# Patient Record
Sex: Male | Born: 1955 | Race: White | Hispanic: No | Marital: Single | Smoking: Never smoker
Health system: Southern US, Community
[De-identification: ages and names within clinical notes are randomized; demographics above are authoritative.]

## PROBLEM LIST (undated history)

## (undated) DIAGNOSIS — E785 Hyperlipidemia, unspecified: Secondary | ICD-10-CM

## (undated) DIAGNOSIS — IMO0001 Reserved for inherently not codable concepts without codable children: Secondary | ICD-10-CM

## (undated) DIAGNOSIS — E119 Type 2 diabetes mellitus without complications: Secondary | ICD-10-CM

## (undated) DIAGNOSIS — K219 Gastro-esophageal reflux disease without esophagitis: Secondary | ICD-10-CM

## (undated) DIAGNOSIS — N200 Calculus of kidney: Secondary | ICD-10-CM

## (undated) DIAGNOSIS — Z87442 Personal history of urinary calculi: Secondary | ICD-10-CM

## (undated) DIAGNOSIS — R51 Headache: Secondary | ICD-10-CM

## (undated) DIAGNOSIS — I10 Essential (primary) hypertension: Secondary | ICD-10-CM

## (undated) DIAGNOSIS — R519 Headache, unspecified: Secondary | ICD-10-CM

## (undated) DIAGNOSIS — M549 Dorsalgia, unspecified: Secondary | ICD-10-CM

## (undated) DIAGNOSIS — M1990 Unspecified osteoarthritis, unspecified site: Secondary | ICD-10-CM

## (undated) HISTORY — PX: APPENDECTOMY: SHX54

## (undated) HISTORY — DX: Calculus of kidney: N20.0

## (undated) HISTORY — DX: Hyperlipidemia, unspecified: E78.5

## (undated) HISTORY — DX: Gastro-esophageal reflux disease without esophagitis: K21.9

## (undated) HISTORY — DX: Unspecified osteoarthritis, unspecified site: M19.90

## (undated) HISTORY — DX: Dorsalgia, unspecified: M54.9

---

## 1998-06-11 HISTORY — PX: CHOLECYSTECTOMY: SHX55

## 2004-04-04 ENCOUNTER — Other Ambulatory Visit: Payer: Self-pay

## 2004-04-04 ENCOUNTER — Inpatient Hospital Stay: Payer: Self-pay

## 2004-08-17 ENCOUNTER — Emergency Department: Payer: Self-pay | Admitting: Emergency Medicine

## 2004-08-17 ENCOUNTER — Other Ambulatory Visit: Payer: Self-pay

## 2004-11-12 ENCOUNTER — Other Ambulatory Visit: Payer: Self-pay

## 2004-11-12 ENCOUNTER — Emergency Department: Payer: Self-pay | Admitting: Unknown Physician Specialty

## 2006-09-12 ENCOUNTER — Emergency Department: Payer: Self-pay | Admitting: Emergency Medicine

## 2006-09-13 ENCOUNTER — Ambulatory Visit: Payer: Self-pay | Admitting: Unknown Physician Specialty

## 2007-10-25 ENCOUNTER — Other Ambulatory Visit: Payer: Self-pay

## 2007-10-25 ENCOUNTER — Emergency Department: Payer: Self-pay | Admitting: Unknown Physician Specialty

## 2009-01-11 ENCOUNTER — Ambulatory Visit: Payer: Self-pay | Admitting: Family Medicine

## 2009-01-14 ENCOUNTER — Ambulatory Visit: Payer: Self-pay | Admitting: Internal Medicine

## 2009-01-14 ENCOUNTER — Ambulatory Visit: Payer: Self-pay | Admitting: Surgery

## 2009-01-17 ENCOUNTER — Ambulatory Visit: Payer: Self-pay | Admitting: Surgery

## 2009-01-24 ENCOUNTER — Ambulatory Visit: Payer: Self-pay | Admitting: Pain Medicine

## 2009-01-27 ENCOUNTER — Ambulatory Visit: Payer: Self-pay | Admitting: Pain Medicine

## 2010-10-24 ENCOUNTER — Ambulatory Visit: Payer: Self-pay | Admitting: Emergency Medicine

## 2010-10-27 LAB — PATHOLOGY REPORT

## 2011-05-08 DIAGNOSIS — M069 Rheumatoid arthritis, unspecified: Secondary | ICD-10-CM | POA: Insufficient documentation

## 2011-05-08 DIAGNOSIS — I10 Essential (primary) hypertension: Secondary | ICD-10-CM | POA: Insufficient documentation

## 2011-05-08 DIAGNOSIS — E785 Hyperlipidemia, unspecified: Secondary | ICD-10-CM | POA: Insufficient documentation

## 2011-05-13 ENCOUNTER — Emergency Department: Payer: Self-pay | Admitting: *Deleted

## 2011-09-20 ENCOUNTER — Emergency Department: Payer: Self-pay | Admitting: Emergency Medicine

## 2012-01-28 ENCOUNTER — Emergency Department: Payer: Self-pay | Admitting: Emergency Medicine

## 2012-01-28 LAB — BASIC METABOLIC PANEL
Anion Gap: 7 (ref 7–16)
BUN: 14 mg/dL (ref 7–18)
Calcium, Total: 9.5 mg/dL (ref 8.5–10.1)
Chloride: 102 mmol/L (ref 98–107)
Co2: 29 mmol/L (ref 21–32)
Creatinine: 0.93 mg/dL (ref 0.60–1.30)
EGFR (African American): >60
EGFR (Non-African Amer.): >60
Glucose: 102 mg/dL — ABNORMAL HIGH (ref 65–99)

## 2012-01-28 LAB — URINALYSIS, COMPLETE
Blood: NEGATIVE
Glucose,UR: 50 mg/dL (ref 0–75)
Leukocyte Esterase: NEGATIVE
Nitrite: NEGATIVE
Ph: 7 (ref 4.5–8.0)
Protein: NEGATIVE
Specific Gravity: 1.019 (ref 1.003–1.030)
WBC UR: 2 /HPF (ref 0–5)

## 2012-01-28 LAB — CBC
MCV: 86 fL (ref 80–100)
Platelet: 249 10*3/uL (ref 150–440)
RDW: 14.1 % (ref 11.5–14.5)
WBC: 8.4 10*3/uL (ref 3.8–10.6)

## 2012-05-23 ENCOUNTER — Emergency Department: Payer: Self-pay | Admitting: Emergency Medicine

## 2012-05-23 LAB — URINALYSIS, COMPLETE
Blood: NEGATIVE
Ketone: NEGATIVE
Nitrite: NEGATIVE
Ph: 7 (ref 4.5–8.0)
Protein: NEGATIVE
RBC,UR: 3 /HPF (ref 0–5)
Specific Gravity: 1.013 (ref 1.003–1.030)
Squamous Epithelial: NONE SEEN
WBC UR: 2 /HPF (ref 0–5)

## 2012-05-23 LAB — CBC
HCT: 45.5 % (ref 40.0–52.0)
HGB: 15.4 g/dL (ref 13.0–18.0)
MCV: 86 fL (ref 80–100)
RBC: 5.29 10*6/uL (ref 4.40–5.90)
RDW: 14.3 % (ref 11.5–14.5)
WBC: 8 10*3/uL (ref 3.8–10.6)

## 2012-05-23 LAB — BASIC METABOLIC PANEL
Anion Gap: 5 — ABNORMAL LOW (ref 7–16)
BUN: 8 mg/dL (ref 7–18)
Chloride: 101 mmol/L (ref 98–107)
Creatinine: 1.02 mg/dL (ref 0.60–1.30)
EGFR (African American): >60
EGFR (Non-African Amer.): >60
Glucose: 222 mg/dL — ABNORMAL HIGH (ref 65–99)
Sodium: 139 mmol/L (ref 136–145)

## 2012-06-18 ENCOUNTER — Ambulatory Visit: Payer: Self-pay

## 2012-12-17 ENCOUNTER — Emergency Department: Payer: Self-pay | Admitting: Emergency Medicine

## 2013-03-28 ENCOUNTER — Emergency Department: Payer: Self-pay | Admitting: Internal Medicine

## 2013-05-05 DIAGNOSIS — E785 Hyperlipidemia, unspecified: Secondary | ICD-10-CM | POA: Insufficient documentation

## 2013-05-05 DIAGNOSIS — N400 Enlarged prostate without lower urinary tract symptoms: Secondary | ICD-10-CM | POA: Insufficient documentation

## 2013-05-05 DIAGNOSIS — K219 Gastro-esophageal reflux disease without esophagitis: Secondary | ICD-10-CM | POA: Insufficient documentation

## 2013-05-05 DIAGNOSIS — M549 Dorsalgia, unspecified: Secondary | ICD-10-CM

## 2013-05-05 HISTORY — DX: Dorsalgia, unspecified: M54.9

## 2013-05-09 ENCOUNTER — Emergency Department: Payer: Self-pay | Admitting: Emergency Medicine

## 2013-05-29 DIAGNOSIS — M7138 Other bursal cyst, other site: Secondary | ICD-10-CM | POA: Insufficient documentation

## 2013-06-11 HISTORY — PX: BACK SURGERY: SHX140

## 2013-07-07 DIAGNOSIS — D582 Other hemoglobinopathies: Secondary | ICD-10-CM | POA: Insufficient documentation

## 2014-03-23 ENCOUNTER — Emergency Department: Payer: Self-pay | Admitting: Emergency Medicine

## 2014-03-23 LAB — URINALYSIS, COMPLETE
BACTERIA: NONE SEEN
Bilirubin,UR: NEGATIVE
Blood: NEGATIVE
Hyaline Cast: 2
Ketone: NEGATIVE
Leukocyte Esterase: NEGATIVE
NITRITE: NEGATIVE
PH: 6 (ref 4.5–8.0)
Protein: NEGATIVE
RBC,UR: <1 /HPF (ref 0–5)
SPECIFIC GRAVITY: 1.015 (ref 1.003–1.030)
Squamous Epithelial: NONE SEEN
WBC UR: 1 /HPF (ref 0–5)

## 2014-03-23 LAB — COMPREHENSIVE METABOLIC PANEL
ALK PHOS: 56 U/L
ALT: 69 U/L — AB
Albumin: 4.2 g/dL (ref 3.4–5.0)
Anion Gap: 9 (ref 7–16)
BUN: 16 mg/dL (ref 7–18)
Bilirubin,Total: 0.7 mg/dL (ref 0.2–1.0)
CHLORIDE: 98 mmol/L (ref 98–107)
CO2: 28 mmol/L (ref 21–32)
Calcium, Total: 8.4 mg/dL — ABNORMAL LOW (ref 8.5–10.1)
Creatinine: 1.05 mg/dL (ref 0.60–1.30)
Glucose: 181 mg/dL — ABNORMAL HIGH (ref 65–99)
Osmolality: 276 (ref 275–301)
Potassium: 2.8 mmol/L — ABNORMAL LOW (ref 3.5–5.1)
SGOT(AST): 32 U/L (ref 15–37)
Sodium: 135 mmol/L — ABNORMAL LOW (ref 136–145)
Total Protein: 7.9 g/dL (ref 6.4–8.2)

## 2014-03-23 LAB — CBC
HCT: 55.9 % — ABNORMAL HIGH (ref 40.0–52.0)
HGB: 18.7 g/dL — AB (ref 13.0–18.0)
MCH: 29.1 pg (ref 26.0–34.0)
MCHC: 33.4 g/dL (ref 32.0–36.0)
MCV: 87 fL (ref 80–100)
PLATELETS: 218 10*3/uL (ref 150–440)
RBC: 6.41 10*6/uL — ABNORMAL HIGH (ref 4.40–5.90)
RDW: 13.9 % (ref 11.5–14.5)
WBC: 8.9 10*3/uL (ref 3.8–10.6)

## 2014-06-11 HISTORY — PX: SHOULDER SURGERY: SHX246

## 2014-08-10 ENCOUNTER — Ambulatory Visit: Payer: Self-pay | Admitting: Specialist

## 2014-08-24 ENCOUNTER — Ambulatory Visit: Payer: Self-pay | Admitting: Anesthesiology

## 2014-09-03 ENCOUNTER — Emergency Department: Payer: Self-pay

## 2015-02-23 ENCOUNTER — Encounter (HOSPITAL_COMMUNITY): Payer: Self-pay | Admitting: *Deleted

## 2015-02-23 DIAGNOSIS — R748 Abnormal levels of other serum enzymes: Secondary | ICD-10-CM | POA: Diagnosis not present

## 2015-02-23 DIAGNOSIS — E119 Type 2 diabetes mellitus without complications: Secondary | ICD-10-CM | POA: Insufficient documentation

## 2015-02-23 DIAGNOSIS — N210 Calculus in bladder: Secondary | ICD-10-CM | POA: Diagnosis not present

## 2015-02-23 DIAGNOSIS — N200 Calculus of kidney: Secondary | ICD-10-CM | POA: Insufficient documentation

## 2015-02-23 DIAGNOSIS — I10 Essential (primary) hypertension: Secondary | ICD-10-CM | POA: Diagnosis not present

## 2015-02-23 DIAGNOSIS — R109 Unspecified abdominal pain: Secondary | ICD-10-CM | POA: Diagnosis present

## 2015-02-23 DIAGNOSIS — Z79899 Other long term (current) drug therapy: Secondary | ICD-10-CM | POA: Diagnosis not present

## 2015-02-23 NOTE — ED Notes (Signed)
Patient presents with c/o abd pain for a month or more.  States his abd gets distended  Denies urinary issues or bowel issues

## 2015-02-24 ENCOUNTER — Emergency Department (HOSPITAL_COMMUNITY): Payer: Medicare Other

## 2015-02-24 ENCOUNTER — Encounter (HOSPITAL_COMMUNITY): Payer: Self-pay | Admitting: Radiology

## 2015-02-24 ENCOUNTER — Emergency Department (HOSPITAL_COMMUNITY)
Admission: EM | Admit: 2015-02-24 | Discharge: 2015-02-24 | Disposition: A | Discharge status: Home / Self Care | Payer: Medicare Other | Attending: Emergency Medicine | Admitting: Emergency Medicine

## 2015-02-24 DIAGNOSIS — N210 Calculus in bladder: Secondary | ICD-10-CM

## 2015-02-24 DIAGNOSIS — N200 Calculus of kidney: Secondary | ICD-10-CM

## 2015-02-24 DIAGNOSIS — R748 Abnormal levels of other serum enzymes: Secondary | ICD-10-CM

## 2015-02-24 HISTORY — DX: Essential (primary) hypertension: I10

## 2015-02-24 HISTORY — DX: Type 2 diabetes mellitus without complications: E11.9

## 2015-02-24 LAB — COMPREHENSIVE METABOLIC PANEL
ALK PHOS: 44 U/L (ref 38–126)
ALT: 36 U/L (ref 17–63)
AST: 34 U/L (ref 15–41)
Albumin: 4.3 g/dL (ref 3.5–5.0)
Anion gap: 12 (ref 5–15)
BUN: 14 mg/dL (ref 6–20)
CALCIUM: 9.5 mg/dL (ref 8.9–10.3)
CHLORIDE: 97 mmol/L — AB (ref 101–111)
CO2: 26 mmol/L (ref 22–32)
CREATININE: 0.89 mg/dL (ref 0.61–1.24)
GFR calc non Af Amer: >60 mL/min (ref >60)
GLUCOSE: 147 mg/dL — AB (ref 65–99)
Potassium: 3.4 mmol/L — ABNORMAL LOW (ref 3.5–5.1)
SODIUM: 135 mmol/L (ref 135–145)
Total Bilirubin: 0.7 mg/dL (ref 0.3–1.2)
Total Protein: 7.3 g/dL (ref 6.5–8.1)

## 2015-02-24 LAB — URINALYSIS, ROUTINE W REFLEX MICROSCOPIC
Bilirubin Urine: NEGATIVE
Glucose, UA: NEGATIVE mg/dL
Hgb urine dipstick: NEGATIVE
Ketones, ur: NEGATIVE mg/dL
Leukocytes, UA: NEGATIVE
NITRITE: NEGATIVE
Protein, ur: NEGATIVE mg/dL
SPECIFIC GRAVITY, URINE: 1.016 (ref 1.005–1.030)
UROBILINOGEN UA: 1 mg/dL (ref 0.0–1.0)
pH: 6.5 (ref 5.0–8.0)

## 2015-02-24 LAB — CBC
HEMATOCRIT: 49.3 % (ref 39.0–52.0)
HEMOGLOBIN: 17.5 g/dL — AB (ref 13.0–17.0)
MCH: 30.4 pg (ref 26.0–34.0)
MCHC: 35.5 g/dL (ref 30.0–36.0)
MCV: 85.6 fL (ref 78.0–100.0)
Platelets: 269 10*3/uL (ref 150–400)
RBC: 5.76 MIL/uL (ref 4.22–5.81)
RDW: 13.5 % (ref 11.5–15.5)
WBC: 9.7 10*3/uL (ref 4.0–10.5)

## 2015-02-24 LAB — LIPASE, BLOOD: LIPASE: 64 U/L — AB (ref 22–51)

## 2015-02-24 MED ORDER — HYDROMORPHONE HCL 1 MG/ML IJ SOLN
1.0000 mg | Freq: Once | INTRAMUSCULAR | Status: AC
  Administered 2015-02-24: 1 mg via INTRAVENOUS
  Filled 2015-02-24: qty 1

## 2015-02-24 MED ORDER — FAMOTIDINE IN NACL 20-0.9 MG/50ML-% IV SOLN
20.0000 mg | Freq: Once | INTRAVENOUS | Status: AC
  Administered 2015-02-24: 20 mg via INTRAVENOUS
  Filled 2015-02-24: qty 50

## 2015-02-24 MED ORDER — TRAMADOL HCL 50 MG PO TABS: 50.0000 mg | ORAL_TABLET | Freq: Four times a day (QID) | ORAL | Status: DC | PRN

## 2015-02-24 MED ORDER — PROMETHAZINE HCL 25 MG PO TABS: 25.0000 mg | ORAL_TABLET | Freq: Four times a day (QID) | ORAL | Status: DC | PRN

## 2015-02-24 MED ORDER — OXYCODONE-ACETAMINOPHEN 5-325 MG PO TABS
2.0000 | ORAL_TABLET | Freq: Once | ORAL | Status: AC
  Administered 2015-02-24: 2 via ORAL
  Filled 2015-02-24: qty 2

## 2015-02-24 MED ORDER — ONDANSETRON 4 MG PO TBDP
4.0000 mg | ORAL_TABLET | Freq: Once | ORAL | Status: AC
  Administered 2015-02-24: 4 mg via ORAL
  Filled 2015-02-24: qty 1

## 2015-02-24 NOTE — ED Notes (Signed)
EDP aware pt requesting different prescription for pain relief - Dr. Kathrynn Humble spoke with pt.

## 2015-02-24 NOTE — ED Provider Notes (Signed)
CSN: [id number]     Arrival date & time 02/23/15  2247 History  This chart was scribed for Howard Biles, MD by Hansel Feinstein, ED Scribe. This patient was seen in room A09C/A09C and the patient's care was started at 2:15 AM.     Chief Complaint  Patient presents with  . Abdominal Pain   The history is provided by the patient. No language interpreter was used.    HPI Comments: [name] is a 59 y.o. male who presents to the Emergency Department complaining of moderate generalized, constant abdominal pain, occasionally radiating to the back, onset 1.5 months ago and worsened today with associated intermittent nausea. He states that he has had constant pain every day for a month. He notes that pain is worsened after eating and in certain positions. He states hx of cholecystectomy 8 years ago. No hx of stomach ulcers, alcohol abuse, liver cirrhosis. He takes OTC medication for acid reflux daily. Pt is a non-smoker. Pt notes that he was evaluated by his PCP 2 weeks ago for his symptoms and was supposed to receive a referral to GI, but has not received follow up information. He denies heavy ibuprofen and tylenol use. He also denies vomiting, diarrhea.   Past Medical History  Diagnosis Date  . Diabetes mellitus without complication   . Hypertension    Past Surgical History  Procedure Laterality Date  . Cholecystectomy    . Back surgery    . Shoulder surgery     No family history on file. Social History  Substance Use Topics  . Smoking status: Never Smoker   . Smokeless tobacco: Never Used  . Alcohol Use: No    Review of Systems  Gastrointestinal: Positive for nausea and abdominal pain. Negative for vomiting and diarrhea.  A complete 10 system review of systems was obtained and all systems are negative except as noted in the HPI and PMH.   Allergies  Review of patient's allergies indicates no known allergies.  Home Medications   Prior to Admission medications   Medication Sig  Start Date End Date Taking? Authorizing Provider  atorvastatin (LIPITOR) 40 MG tablet Take 40 mg by mouth daily. 10/05/14  Yes Historical Provider, MD  cetirizine (ZYRTEC) 10 MG chewable tablet Chew 10 mg by mouth daily.   Yes Historical Provider, MD  chlorthalidone (HYGROTON) 50 MG tablet Take 50 mg by mouth daily. 01/13/15  Yes Historical Provider, MD  lisinopril (PRINIVIL,ZESTRIL) 20 MG tablet Take 20 mg by mouth daily. 10/19/14  Yes Historical Provider, MD  metFORMIN (GLUCOPHAGE) 1000 MG tablet Take 1,000 mg by mouth 2 (two) times daily. 10/05/14  Yes Historical Provider, MD  omeprazole (PRILOSEC) 40 MG capsule Take 40 mg by mouth 2 (two) times daily. 02/07/15  Yes Historical Provider, MD  oxyCODONE-acetaminophen (PERCOCET) 5-325 MG per tablet Take 1 tablet by mouth every 8 (eight) hours as needed. 02/09/15  Yes Historical Provider, MD  testosterone cypionate (DEPOTESTOSTERONE CYPIONATE) 200 MG/ML injection Inject 200 mg into the muscle every 14 (fourteen) days. 05/12/12  Yes Historical Provider, MD  promethazine (PHENERGAN) 25 MG tablet Take 1 tablet (25 mg total) by mouth every 6 (six) hours as needed for nausea. 02/24/15   Howard Biles, MD  traMADol (ULTRAM) 50 MG tablet Take 1 tablet (50 mg total) by mouth every 6 (six) hours as needed. 02/24/15   Allan Minotti, MD   BP 152/94 mmHg  Pulse 71  Temp(Src) 98.2 F (36.8 C) (Oral)  Resp 16  Ht 5'  7" (1.702 m)  Wt 187 lb (84.823 kg)  BMI 29.28 kg/m2  SpO2 94% Physical Exam  Constitutional: He is oriented to person, place, and time. He appears well-developed and well-nourished.  HENT:  Head: Normocephalic and atraumatic.  Eyes: Conjunctivae and EOM are normal. Pupils are equal, round, and reactive to light.  Neck: Normal range of motion. Neck supple.  Cardiovascular: Normal rate, regular rhythm and normal heart sounds.   Pulmonary/Chest: Effort normal and breath sounds normal. No respiratory distress.  Anterior lungs CTA  Abdominal: Soft.  Bowel sounds are normal. He exhibits no distension. There is tenderness.  + bowel sounds. Diffuse tenderness of the abdomen with no peritoneal signs. Worse in epigastric and RUQ region. No flank tenderness, no ecchymosis.   Musculoskeletal: Normal range of motion.  Neurological: He is alert and oriented to person, place, and time.  Skin: Skin is warm and dry.  Psychiatric: He has a normal mood and affect. His behavior is normal.  Nursing note and vitals reviewed.  ED Course  Procedures (including critical care time) DIAGNOSTIC STUDIES: Oxygen Saturation is 96% on RA, adequate by my interpretation.    COORDINATION OF CARE: 2:24 AM Discussed treatment plan with pt at bedside and pt agreed to plan.   Labs Review Labs Reviewed  LIPASE, BLOOD - Abnormal; Notable for the following:    Lipase 64 (*)    All other components within normal limits  COMPREHENSIVE METABOLIC PANEL - Abnormal; Notable for the following:    Potassium 3.4 (*)    Chloride 97 (*)    Glucose, Bld 147 (*)    All other components within normal limits  CBC - Abnormal; Notable for the following:    Hemoglobin 17.5 (*)    All other components within normal limits  URINALYSIS, ROUTINE W REFLEX MICROSCOPIC (NOT AT [facility])    Imaging Review Ct Abdomen Wo Contrast  02/24/2015   CLINICAL DATA:  Generalized, constant abdominal pain radiating to the back for 1-1/2 months, worsening today, with intermittent nausea.  EXAM: CT ABDOMEN WITHOUT CONTRAST  TECHNIQUE: Multidetector CT imaging of the abdomen was performed following the standard protocol without IV contrast.  COMPARISON:  Abdominal ultrasound February 24, 2015 at 3:08 a.m.  FINDINGS: LUNG BASES: RIGHT middle and RIGHT lower lobe atelectasis. Included heart size is normal, no pericardial effusions. Elevated RIGHT hemidiaphragm.  KIDNEYS/BLADDER: Kidneys are orthotopic, demonstrating normal size and morphology. Punctate LEFT upper pole nephrolithiasis. No hydronephrosis;  limited assessment for renal masses on this nonenhanced examination. The unopacified ureters are normal in course and caliber. Urinary bladder is partially distended, with 2 mm RIGHT bladder calculus.  SOLID ORGANS: The liver, spleen, pancreas and adrenal glands are unremarkable for this non-contrast examination. Status post cholecystectomy.  GASTROINTESTINAL TRACT: The stomach, small and large bowel are normal in course and caliber without inflammatory changes, the sensitivity may be decreased by lack of enteric contrast. Mild around of retained large bowel stool. Normal appendix.  PERITONEUM/RETROPERITONEUM: Aortoiliac vessels are normal in course and caliber, mild calcific atherosclerosis. No lymphadenopathy by CT size criteria. Prostate gland is mildly enlarged, slightly invading the base the urinary bladder. No intraperitoneal free fluid nor free air.  SOFT TISSUES/ OSSEOUS STRUCTURES: Nonsuspicious. Small fat containing LEFT inguinal hernia. Degenerative lumbar spine, moderate lower lumbar facet arthropathy. Small to moderate fat containing umbilical hernia.  IMPRESSION: 2 mm RIGHT bladder calculus suggest recently passed stone, no obstructive uropathy.  Punctate LEFT upper pole nephrolithiasis.   Electronically Signed   By: Elon Alas  M.D.   On: 02/24/2015 06:52   US Abdomen Complete  02/24/2015   CLINICAL DATA:  59 year old male with diffuse abdominal pain and elevated lipase. History of cholecystectomy.  EXAM: ULTRASOUND ABDOMEN COMPLETE  COMPARISON:  None.  FINDINGS: The study is limited due to patient's body habitus and overlying bowel gas.  Gallbladder: Cholecystectomy  Common bile duct: Diameter: Not visualized  Liver: Appears echogenic as visualized. There is poor visualization of the liver.  IVC: Not visualized  Pancreas: Suboptimally visualized. The visualized head of the pancreas appears grossly unremarkable.  Spleen: Unremarkable.  Right Kidney: Length: 10 cm. Faint echogenic foci within  the right kidney may represent nonobstructing calculi. No hydronephrosis.  Left Kidney: Length: 12 cm. Small echogenic foci likely represent small stones. There is minimal fullness of the left renal collecting system.  Abdominal aorta: Not visualized  Other findings: None.  IMPRESSION: Very limited study. Small bilateral renal calculi. No hydronephrosis.  Probable fatty liver.   Electronically Signed   By: Anner Crete M.D.   On: 02/24/2015 03:43   I have personally reviewed and evaluated these images and lab results as part of my medical decision-making.   EKG Interpretation None      MDM   Final diagnoses:  Nephrolithiasis  Urinary bladder stone  Elevated lipase    I personally performed the services described in this documentation, which was scribed in my presence. The recorded information has been reviewed and is accurate.  Pt comes in with cc of generalized abd pain x 1 month. Pain sometimes radiates to the back. Exam is non peritoneal. Pt has slightly elevated lipase. We discussed clear liquid diet and pcp f/u. Pt however having trouble with oral challenge - and so CT done. Ct shows stone in a bladder - it is possible that her pain is from the stone. Will give GI f/u and PCP f/u. Return to ER discussed.  Howard Biles, MD 02/24/15 [phone]

## 2015-02-24 NOTE — Discharge Instructions (Signed)
All the results in the ER are normal EXCEPT slightly elevated lipase and a stone in the bladder.  The workup in the ER is not complete, and is limited to screening for life threatening and emergent conditions only, so please see a primary care doctor for further evaluation.  We think you should see the GI doctor and primary care doctor in a few days. If the pain gets severe, come back to the ER.   Kidney Stones Kidney stones (urolithiasis) are deposits that form inside your kidneys. The intense pain is caused by the stone moving through the urinary tract. When the stone moves, the ureter goes into spasm around the stone. The stone is usually passed in the urine.  CAUSES   A disorder that makes certain neck glands produce too much parathyroid hormone (primary hyperparathyroidism).  A buildup of uric acid crystals, similar to gout in your joints.  Narrowing (stricture) of the ureter.  A kidney obstruction present at birth (congenital obstruction).  Previous surgery on the kidney or ureters.  Numerous kidney infections. SYMPTOMS   Feeling sick to your stomach (nauseous).  Throwing up (vomiting).  Blood in the urine (hematuria).  Pain that usually spreads (radiates) to the groin.  Frequency or urgency of urination. DIAGNOSIS   Taking a history and physical exam.  Blood or urine tests.  CT scan.  Occasionally, an examination of the inside of the urinary bladder (cystoscopy) is performed. TREATMENT   Observation.  Increasing your fluid intake.  Extracorporeal shock wave lithotripsy--This is a noninvasive procedure that uses shock waves to break up kidney stones.  Surgery may be needed if you have severe pain or persistent obstruction. There are various surgical procedures. Most of the procedures are performed with the use of small instruments. Only small incisions are needed to accommodate these instruments, so recovery time is minimized. The size, location, and chemical  composition are all important variables that will determine the proper choice of action for you. Talk to your health care provider to better understand your situation so that you will minimize the risk of injury to yourself and your kidney.  HOME CARE INSTRUCTIONS   Drink enough water and fluids to keep your urine clear or pale yellow. This will help you to pass the stone or stone fragments.  Strain all urine through the provided strainer. Keep all particulate matter and stones for your health care provider to see. The stone causing the pain may be as small as a grain of salt. It is very important to use the strainer each and every time you pass your urine. The collection of your stone will allow your health care provider to analyze it and verify that a stone has actually passed. The stone analysis will often identify what you can do to reduce the incidence of recurrences.  Only take over-the-counter or prescription medicines for pain, discomfort, or fever as directed by your health care provider.  Make a follow-up appointment with your health care provider as directed.  Get follow-up X-rays if required. The absence of pain does not always mean that the stone has passed. It may have only stopped moving. If the urine remains completely obstructed, it can cause loss of kidney function or even complete destruction of the kidney. It is your responsibility to make sure X-rays and follow-ups are completed. Ultrasounds of the kidney can show blockages and the status of the kidney. Ultrasounds are not associated with any radiation and can be performed easily in a matter of minutes.  SEEK MEDICAL CARE IF:  You experience pain that is progressive and unresponsive to any pain medicine you have been prescribed. SEEK IMMEDIATE MEDICAL CARE IF:   Pain cannot be controlled with the prescribed medicine.  You have a fever or shaking chills.  The severity or intensity of pain increases over 18 hours and is not  relieved by pain medicine.  You develop a new onset of abdominal pain.  You feel faint or pass out.  You are unable to urinate. MAKE SURE YOU:   Understand these instructions.  Will watch your condition.  Will get help right away if you are not doing well or get worse. Document Released: 05/28/2005 Document Revised: 01/28/2013 Document Reviewed: 10/29/2012 [facility] Patient Information 2015 [place], [place]. This information is not intended to replace advice given to you by your health care provider. Make sure you discuss any questions you have with your health care provider. Clear Liquid Diet A clear liquid diet is a short-term diet that is prescribed to provide the necessary fluid and basic energy you need when you can have nothing else. The clear liquid diet consists of liquids or solids that will become liquid at room temperature. You should be able to see through the liquid. There are many reasons that you may be restricted to clear liquids, such as:  When you have a sudden-onset (acute) condition that occurs before or after surgery.  To help your body slowly get adjusted to food again after a long period when you were unable to have food.  Replacement of fluids when you have a diarrheal disease.  When you are going to have certain exams, such as a colonoscopy, in which instruments are inserted inside your body to look at parts of your digestive system. WHAT CAN I HAVE? A clear liquid diet does not provide all the nutrients you need. It is important to choose a variety of the following items to get as many nutrients as possible:  Vegetable juices that do not have pulp.  Fruit juices and fruit drinks that do not have pulp.  Coffee (regular or decaffeinated), tea, or soda at the discretion of your health care provider.  Clear bouillon, broth, or strained broth-based soups.  High-protein and flavored gelatins.  Sugar or honey.  Ices or frozen ice pops that do not contain  milk. If you are not sure whether you can have certain items, you should ask your health care provider. You may also ask your health care provider if there are any other clear liquid options. Document Released: 05/28/2005 Document Revised: 06/02/2013 Document Reviewed: 04/24/2013 [facility] Patient Information 2015 [place], [place]. This information is not intended to replace advice given to you by your health care provider. Make sure you discuss any questions you have with your health care provider. Gastritis, Adult Gastritis is soreness and swelling (inflammation) of the lining of the stomach. Gastritis can develop as a sudden onset (acute) or long-term (chronic) condition. If gastritis is not treated, it can lead to stomach bleeding and ulcers. CAUSES  Gastritis occurs when the stomach lining is weak or damaged. Digestive juices from the stomach then inflame the weakened stomach lining. The stomach lining may be weak or damaged due to viral or bacterial infections. One common bacterial infection is the Helicobacter pylori infection. Gastritis can also result from excessive alcohol consumption, taking certain medicines, or having too much acid in the stomach.  SYMPTOMS  In some cases, there are no symptoms. When symptoms are present, they may include:  Pain  or a burning sensation in the upper abdomen.  Nausea.  Vomiting.  An uncomfortable feeling of fullness after eating. DIAGNOSIS  Your caregiver may suspect you have gastritis based on your symptoms and a physical exam. To determine the cause of your gastritis, your caregiver may perform the following:  Blood or stool tests to check for the H pylori bacterium.  Gastroscopy. A thin, flexible tube (endoscope) is passed down the esophagus and into the stomach. The endoscope has a light and camera on the end. Your caregiver uses the endoscope to view the inside of the stomach.  Taking a tissue sample (biopsy) from the stomach to examine under a  microscope. TREATMENT  Depending on the cause of your gastritis, medicines may be prescribed. If you have a bacterial infection, such as an H pylori infection, antibiotics may be given. If your gastritis is caused by too much acid in the stomach, H2 blockers or antacids may be given. Your caregiver may recommend that you stop taking aspirin, ibuprofen, or other nonsteroidal anti-inflammatory drugs (NSAIDs). HOME CARE INSTRUCTIONS  Only take over-the-counter or prescription medicines as directed by your caregiver.  If you were given antibiotic medicines, take them as directed. Finish them even if you start to feel better.  Drink enough fluids to keep your urine clear or pale yellow.  Avoid foods and drinks that make your symptoms worse, such as:  Caffeine or alcoholic drinks.  Chocolate.  Peppermint or mint flavorings.  Garlic and onions.  Spicy foods.  Citrus fruits, such as oranges, lemons, or limes.  Tomato-based foods such as sauce, chili, salsa, and pizza.  Fried and fatty foods.  Eat small, frequent meals instead of large meals. SEEK IMMEDIATE MEDICAL CARE IF:   You have black or dark red stools.  You vomit blood or material that looks like coffee grounds.  You are unable to keep fluids down.  Your abdominal pain gets worse.  You have a fever.  You do not feel better after 1 week.  You have any other questions or concerns. MAKE SURE YOU:  Understand these instructions.  Will watch your condition.  Will get help right away if you are not doing well or get worse. Document Released: 05/22/2001 Document Revised: 11/27/2011 Document Reviewed: 07/11/2011 [facility] Patient Information 2015 [place], [place]. This information is not intended to replace advice given to you by your health care provider. Make sure you discuss any questions you have with your health care provider. Acute Pancreatitis Acute pancreatitis is a disease in which the pancreas becomes suddenly  irritated (inflamed). The pancreas is a large gland behind your stomach. The pancreas makes enzymes that help digest food. The pancreas also makes 2 hormones that help control your blood sugar. Acute pancreatitis happens when the enzymes attack and damage the pancreas. Most attacks last a couple of days and can cause serious problems. HOME CARE  Follow your doctor's diet instructions. You may need to avoid alcohol and limit fat in your diet.  Eat small meals often.  Drink enough fluids to keep your pee (urine) clear or pale yellow.  Only take medicines as told by your doctor.  Avoid drinking alcohol if it caused your disease.  Do not smoke.  Get plenty of rest.  Check your blood sugar at home as told by your doctor.  Keep all doctor visits as told. GET HELP IF:  You do not get better as quickly as expected.  You have new or worsening symptoms.  You have lasting pain, weakness,  or feel sick to your stomach (nauseous).  You get better and then have another pain attack. GET HELP RIGHT AWAY IF:   You are unable to eat or keep fluids down.  Your pain becomes severe.  You have a fever or lasting symptoms for more than 2 to 3 days.  You have a fever and your symptoms suddenly get worse.  Your skin or the white part of your eyes turn yellow (jaundice).  You throw up (vomit).  You feel dizzy, or you pass out (faint).  Your blood sugar is high (over 300 mg/dL). MAKE SURE YOU:   Understand these instructions.  Will watch your condition.  Will get help right away if you are not doing well or get worse. Document Released: 11/14/2007 Document Revised: 10/12/2013 Document Reviewed: 09/06/2011 [facility] Patient Information 2015 [place], [place]. This information is not intended to replace advice given to you by your health care provider. Make sure you discuss any questions you have with your health care provider.

## 2015-02-24 NOTE — ED Notes (Signed)
Reported to dr. Mariane Masters that patient changed his mind and would like ct scan completed. MD acknowledges, planning for CT.

## 2015-02-24 NOTE — ED Notes (Signed)
Called Dr. Mariane Masters to report patient's request for pain and nausea medication. MD acknowledges, awaiting new orders.

## 2015-03-01 ENCOUNTER — Encounter: Payer: Self-pay | Admitting: Physician Assistant

## 2015-03-07 ENCOUNTER — Ambulatory Visit (INDEPENDENT_AMBULATORY_CARE_PROVIDER_SITE_OTHER): Payer: Medicare Other | Admitting: Urology

## 2015-03-07 ENCOUNTER — Encounter: Payer: Self-pay | Admitting: Urology

## 2015-03-07 VITALS — BP 126/82 | HR 80 | Ht 67.5 in | Wt 186.0 lb

## 2015-03-07 DIAGNOSIS — N400 Enlarged prostate without lower urinary tract symptoms: Secondary | ICD-10-CM

## 2015-03-07 LAB — MICROSCOPIC EXAMINATION
BACTERIA UA: NONE SEEN
EPITHELIAL CELLS (NON RENAL): NONE SEEN /HPF (ref 0–10)
RBC, UA: NONE SEEN /hpf (ref 0–?)

## 2015-03-07 LAB — URINALYSIS, COMPLETE
Bilirubin, UA: NEGATIVE
Glucose, UA: NEGATIVE
Ketones, UA: NEGATIVE
LEUKOCYTES UA: NEGATIVE
Nitrite, UA: NEGATIVE
PH UA: 7 (ref 5.0–7.5)
PROTEIN UA: NEGATIVE
RBC, UA: NEGATIVE
Specific Gravity, UA: 1.015 (ref 1.005–1.030)
Urobilinogen, Ur: 0.2 mg/dL (ref 0.2–1.0)

## 2015-03-07 LAB — BLADDER SCAN AMB NON-IMAGING

## 2015-03-07 MED ORDER — TAMSULOSIN HCL 0.4 MG PO CAPS: 0.4000 mg | ORAL_CAPSULE | Freq: Every day | ORAL | Status: DC

## 2015-03-07 NOTE — Progress Notes (Signed)
03/07/2015 2:15 PM   Howard Murphy  Jun 23, 1955 [id number]  Referring provider: No referring provider defined for this encounter.  Chief Complaint  Patient presents with  . Benign Prostatic Hypertrophy    New Patient    HPI: Howard Murphy is a patient assessed to this office in the past with BPH. He gets up 4-5 times a night or less. He voids every 2 archer in the daytime. His flow was slow and sometimes trickling. He is to be on Flomax and he thinks this helped but currently is not on it for approximately 2 years. He does not give blood in the urine or urinary tract infections  Modifying factors: There are no other modifying factors  Associated signs and symptoms: There are no other associated signs and symptoms Aggravating and relieving factors: There are no other aggravating or relieving factors Severity: Moderate Duration: Persistent     PMH: Past Medical History  Diagnosis Date  . Diabetes mellitus without complication   . Hypertension     Surgical History: Past Surgical History  Procedure Laterality Date  . Cholecystectomy    . Back surgery    . Shoulder surgery      Home Medications:    Medication List       This list is accurate as of: 03/07/15  2:15 PM.  Always use your most recent med list.               atorvastatin 40 MG tablet  Commonly known as:  LIPITOR  Take 40 mg by mouth daily.     cetirizine 10 MG chewable tablet  Commonly known as:  ZYRTEC  Chew 10 mg by mouth daily.     chlorthalidone 50 MG tablet  Commonly known as:  HYGROTON  Take 50 mg by mouth daily.     lisinopril 20 MG tablet  Commonly known as:  PRINIVIL,ZESTRIL  Take 20 mg by mouth daily.     metFORMIN 1000 MG tablet  Commonly known as:  GLUCOPHAGE  Take 1,000 mg by mouth 2 (two) times daily.     omeprazole 40 MG capsule  Commonly known as:  PRILOSEC  Take 40 mg by mouth 2 (two) times daily.     PERCOCET 5-325 MG per tablet  Generic drug:  oxyCODONE-acetaminophen    Take 1 tablet by mouth every 8 (eight) hours as needed.     promethazine 25 MG tablet  Commonly known as:  PHENERGAN  Take 1 tablet (25 mg total) by mouth every 6 (six) hours as needed for nausea.     testosterone cypionate 200 MG/ML injection  Commonly known as:  DEPOTESTOSTERONE CYPIONATE  Inject 200 mg into the muscle every 14 (fourteen) days.     traMADol 50 MG tablet  Commonly known as:  ULTRAM  Take 1 tablet (50 mg total) by mouth every 6 (six) hours as needed.        Allergies: No Known Allergies  Family History: No family history on file.  Social History:  reports that he has never smoked. He has never used smokeless tobacco. He reports that he does not drink alcohol or use illicit drugs.  ROS:                                        Physical Exam: There were no vitals taken for this visit.  Constitutional:  Alert and oriented, No  acute distress. HEENT: [place] AT, moist mucus membranes.  Trachea midline, no masses. Cardiovascular: No clubbing, cyanosis, or edema. Respiratory: Normal respiratory effort, no increased work of breathing. GI: Abdomen is soft, nontender, nondistended, no abdominal masses GU: No CVA tenderness. Benign 40 g prostate Skin: No rashes, bruises or suspicious lesions. Lymph: No cervical or inguinal adenopathy. Neurologic: Grossly intact, no focal deficits, moving all 4 extremities. Psychiatric: Normal mood and affect.  Laboratory Data: Lab Results  Component Value Date   WBC 9.7 02/23/2015   HGB 17.5* 02/23/2015   HCT 49.3 02/23/2015   MCV 85.6 02/23/2015   PLT 269 02/23/2015    Lab Results  Component Value Date   CREATININE 0.89 02/23/2015    No results found for: PSA  No results found for: TESTOSTERONE  No results found for: HGBA1C  Urinalysis    Component Value Date/Time   COLORURINE YELLOW 02/23/2015 2347   COLORURINE Yellow 03/23/2014 2003   APPEARANCEUR CLEAR 02/23/2015 2347   APPEARANCEUR Clear  03/23/2014 2003   LABSPEC 1.016 02/23/2015 2347   LABSPEC 1.015 03/23/2014 2003   PHURINE 6.5 02/23/2015 2347   PHURINE 6.0 03/23/2014 2003   GLUCOSEU NEGATIVE 02/23/2015 2347   GLUCOSEU 50 mg/dL 03/23/2014 2003   HGBUR NEGATIVE 02/23/2015 2347   HGBUR Negative 03/23/2014 2003   [place] NEGATIVE 02/23/2015 [place] Negative 03/23/2014 2003   [place] NEGATIVE 02/23/2015 [place] Negative 03/23/2014 2003   PROTEINUR NEGATIVE 02/23/2015 2347   PROTEINUR Negative 03/23/2014 2003   UROBILINOGEN 1.0 02/23/2015 2347   NITRITE NEGATIVE 02/23/2015 2347   NITRITE Negative 03/23/2014 2003   LEUKOCYTESUR NEGATIVE 02/23/2015 2347   LEUKOCYTESUR Negative 03/23/2014 2003    Pertinent Imaging: None  Assessment & Plan:  BPH with nighttime frequency and slow flow  1. BPH (benign prostatic hyperplasia) Restart Flomax and follow-up in clinic in 4-6 weeks - Urinalysis, Complete - BLADDER SCAN AMB NON-IMAGING  Howard Packer, MD  [facility] [address], [place], [place] [address] [phone]

## 2015-03-08 ENCOUNTER — Other Ambulatory Visit: Payer: Self-pay

## 2015-03-08 DIAGNOSIS — N400 Enlarged prostate without lower urinary tract symptoms: Secondary | ICD-10-CM

## 2015-03-08 MED ORDER — TAMSULOSIN HCL 0.4 MG PO CAPS: 0.4000 mg | ORAL_CAPSULE | Freq: Every day | ORAL | Status: DC

## 2015-03-16 ENCOUNTER — Ambulatory Visit: Payer: Medicare Other | Admitting: Physician Assistant

## 2015-03-22 DIAGNOSIS — R0602 Shortness of breath: Secondary | ICD-10-CM | POA: Insufficient documentation

## 2015-04-04 ENCOUNTER — Ambulatory Visit: Payer: Medicare Other | Admitting: Obstetrics and Gynecology

## 2015-04-04 ENCOUNTER — Encounter: Payer: Self-pay | Admitting: *Deleted

## 2015-04-04 ENCOUNTER — Encounter: Payer: Self-pay | Admitting: Obstetrics and Gynecology

## 2015-04-11 ENCOUNTER — Ambulatory Visit
Admission: RE | Admit: 2015-04-11 | Discharge: 2015-04-11 | Disposition: A | Discharge status: Home / Self Care | Payer: Medicare Other | Source: Ambulatory Visit | Attending: Unknown Physician Specialty | Admitting: Unknown Physician Specialty

## 2015-04-11 ENCOUNTER — Ambulatory Visit: Payer: Medicare Other | Admitting: *Deleted

## 2015-04-11 ENCOUNTER — Encounter
Admission: RE | Disposition: A | Discharge status: Home / Self Care | Payer: Self-pay | Source: Ambulatory Visit | Attending: Unknown Physician Specialty

## 2015-04-11 ENCOUNTER — Encounter: Payer: Self-pay | Admitting: *Deleted

## 2015-04-11 DIAGNOSIS — M1990 Unspecified osteoarthritis, unspecified site: Secondary | ICD-10-CM | POA: Diagnosis not present

## 2015-04-11 DIAGNOSIS — I10 Essential (primary) hypertension: Secondary | ICD-10-CM | POA: Diagnosis not present

## 2015-04-11 DIAGNOSIS — D120 Benign neoplasm of cecum: Secondary | ICD-10-CM | POA: Insufficient documentation

## 2015-04-11 DIAGNOSIS — Z87442 Personal history of urinary calculi: Secondary | ICD-10-CM | POA: Diagnosis not present

## 2015-04-11 DIAGNOSIS — R1013 Epigastric pain: Secondary | ICD-10-CM | POA: Diagnosis present

## 2015-04-11 DIAGNOSIS — E119 Type 2 diabetes mellitus without complications: Secondary | ICD-10-CM | POA: Insufficient documentation

## 2015-04-11 DIAGNOSIS — K2950 Unspecified chronic gastritis without bleeding: Secondary | ICD-10-CM | POA: Diagnosis not present

## 2015-04-11 DIAGNOSIS — E785 Hyperlipidemia, unspecified: Secondary | ICD-10-CM | POA: Insufficient documentation

## 2015-04-11 DIAGNOSIS — K640 First degree hemorrhoids: Secondary | ICD-10-CM | POA: Insufficient documentation

## 2015-04-11 DIAGNOSIS — K219 Gastro-esophageal reflux disease without esophagitis: Secondary | ICD-10-CM | POA: Diagnosis not present

## 2015-04-11 HISTORY — PX: ESOPHAGOGASTRODUODENOSCOPY (EGD) WITH PROPOFOL: SHX5813

## 2015-04-11 HISTORY — PX: COLONOSCOPY WITH PROPOFOL: SHX5780

## 2015-04-11 HISTORY — DX: Reserved for inherently not codable concepts without codable children: IMO0001

## 2015-04-11 LAB — GLUCOSE, CAPILLARY: Glucose-Capillary: 151 mg/dL — ABNORMAL HIGH (ref 65–99)

## 2015-04-11 SURGERY — COLONOSCOPY WITH PROPOFOL
Anesthesia: General

## 2015-04-11 MED ORDER — PHENYLEPHRINE HCL 10 MG/ML IJ SOLN
INTRAMUSCULAR | Status: DC | PRN
  Administered 2015-04-11 (×2): 50 ug via INTRAVENOUS

## 2015-04-11 MED ORDER — MIDAZOLAM HCL 2 MG/2ML IJ SOLN
INTRAMUSCULAR | Status: DC | PRN
  Administered 2015-04-11: 1 mg via INTRAVENOUS

## 2015-04-11 MED ORDER — PROPOFOL 500 MG/50ML IV EMUL
INTRAVENOUS | Status: DC | PRN
  Administered 2015-04-11: 120 ug/kg/min via INTRAVENOUS

## 2015-04-11 MED ORDER — FENTANYL CITRATE (PF) 100 MCG/2ML IJ SOLN
INTRAMUSCULAR | Status: DC | PRN
  Administered 2015-04-11: 50 ug via INTRAVENOUS

## 2015-04-11 MED ORDER — LIDOCAINE HCL (CARDIAC) 20 MG/ML IV SOLN
INTRAVENOUS | Status: DC | PRN
  Administered 2015-04-11: 50 mg via INTRAVENOUS

## 2015-04-11 MED ORDER — GLYCOPYRROLATE 0.2 MG/ML IJ SOLN
INTRAMUSCULAR | Status: DC | PRN
  Administered 2015-04-11: 0.1 mg via INTRAVENOUS

## 2015-04-11 MED ORDER — SODIUM CHLORIDE 0.9 % IV SOLN
INTRAVENOUS | Status: DC
  Administered 2015-04-11: 10:00:00 via INTRAVENOUS

## 2015-04-11 NOTE — Anesthesia Postprocedure Evaluation (Signed)
  Anesthesia Post-op Note  Patient: Howard Murphy  Procedure(s) Performed: Procedure(s): COLONOSCOPY WITH PROPOFOL (N/A) ESOPHAGOGASTRODUODENOSCOPY (EGD) WITH PROPOFOL (N/A)  Anesthesia type:General  Patient location: PACU  Post pain: Pain level controlled  Post assessment: Post-op Vital signs reviewed, Patient's Cardiovascular Status Stable, Respiratory Function Stable, Patent Airway and No signs of Nausea or vomiting  Post vital signs: Reviewed and stable  Last Vitals:  Filed Vitals:   04/11/15 0949  BP: 119/93  Pulse: 80  Temp: 36.8 C  Resp: 18    Level of consciousness: awake, alert  and patient cooperative  Complications: No apparent anesthesia complications

## 2015-04-11 NOTE — Anesthesia Preprocedure Evaluation (Signed)
Anesthesia Evaluation  Patient identified by MRN, date of birth, ID band  Reviewed: Allergy & Precautions, NPO status , Patient's Chart, lab work & pertinent test results  Airway Mallampati: II  TM Distance: >3 FB Neck ROM: Limited    Dental  (+) Partial Upper   Pulmonary    Pulmonary exam normal        Cardiovascular Exercise Tolerance: Good hypertension, Pt. on medications Normal cardiovascular exam     Neuro/Psych    GI/Hepatic GERD  Medicated and Controlled,  Endo/Other  diabetes, Type 2  Renal/GU Stones.     Musculoskeletal   Abdominal (+)  Abdomen: soft.    Peds  Hematology   Anesthesia Other Findings   Reproductive/Obstetrics                             Anesthesia Physical Anesthesia Plan  ASA: III  Anesthesia Plan: General   Post-op Pain Management:    Induction: Intravenous  Airway Management Planned: Nasal Cannula  Additional Equipment:   Intra-op Plan:   Post-operative Plan:   Informed Consent: I have reviewed the patients History and Physical, chart, labs and discussed the procedure including the risks, benefits and alternatives for the proposed anesthesia with the patient or authorized representative who has indicated his/her understanding and acceptance.     Plan Discussed with: CRNA  Anesthesia Plan Comments:         Anesthesia Quick Evaluation

## 2015-04-11 NOTE — Anesthesia Procedure Notes (Signed)
Performed by: COOK-MARTIN, Armanii Urbanik Pre-anesthesia Checklist: Patient identified, Emergency Drugs available, Suction available, Patient being monitored and Timeout performed Patient Re-evaluated:Patient Re-evaluated prior to inductionOxygen Delivery Method: Nasal cannula Preoxygenation: Pre-oxygenation with 100% oxygen Intubation Type: IV induction Airway Equipment and Method: Bite block Placement Confirmation: positive ETCO2 and CO2 detector     

## 2015-04-11 NOTE — Op Note (Signed)
[facility] Patient Name: Howard Murphy Procedure Date: 04/11/2015 10:41 AM MRN: [id number] Account #: [id number] Date of Birth: 1955/11/09 Admit Type: Outpatient Age: 59 Room: [facility] ENDO ROOM 1 Gender: Male Note Status: Finalized Procedure:         Colonoscopy Indications:       Generalized abdominal pain, Periumbilical abdominal pain Providers:         Manya Silvas, MD Medicines:         Propofol per Anesthesia Complications:     No immediate complications. Procedure:         Pre-Anesthesia Assessment:                    - After reviewing the risks and benefits, the patient was                     deemed in satisfactory condition to undergo the procedure.                    After obtaining informed consent, the colonoscope was                     passed under direct vision. Throughout the procedure, the                     patient's blood pressure, pulse, and oxygen saturations                     were monitored continuously. The Colonoscope was                     introduced through the anus and advanced to the the cecum,                     identified by appendiceal orifice and ileocecal valve. The                     colonoscopy was performed without difficulty. The patient                     tolerated the procedure well. The quality of the bowel                     preparation was good. Findings:      Internal hemorrhoids were found during endoscopy. The hemorrhoids were       small and Grade I (internal hemorrhoids that do not prolapse).      A diminutive polyp was found in the ascending colon. The polyp was       sessile. The polyp was removed with a cold biopsy forceps. Resection and       retrieval were complete.      Colon otherwise normal. Impression:        - Internal hemorrhoids.                    - The examination was otherwise normal.                    - No specimens collected. Recommendation:    - Repeat colonoscopy in  10 years for screening purposes.                     Try Hyoscyamine for abdominal cramps Manya Silvas, MD 04/11/2015 11:30:13 AM This report has been signed electronically.  Number of Addenda: 0 Note Initiated On: 04/11/2015 10:41 AM Scope Withdrawal Time: 0 hours 13 minutes 44 seconds  Total Procedure Duration: 0 hours 18 minutes 41 seconds       [facility]

## 2015-04-11 NOTE — Op Note (Signed)
[facility] Patient Name: Howard Murphy Procedure Date: 04/11/2015 10:42 AM MRN: [id number] Account #: [id number] Date of Birth: 07/08/1955 Admit Type: Outpatient Age: 59 Room: [facility] ENDO ROOM 1 Gender: Male Note Status: Finalized Procedure:         Upper GI endoscopy Indications:       Epigastric abdominal pain, Periumbilical abdominal pain Providers:         Manya Silvas, MD Medicines:         Propofol per Anesthesia Complications:     No immediate complications. Procedure:         Pre-Anesthesia Assessment:                    - After reviewing the risks and benefits, the patient was                     deemed in satisfactory condition to undergo the procedure.                    After obtaining informed consent, the endoscope was passed                     under direct vision. Throughout the procedure, the                     patient's blood pressure, pulse, and oxygen saturations                     were monitored continuously. The Endoscope was introduced                     through the mouth, and advanced to the second part of                     duodenum. The upper GI endoscopy was accomplished without                     difficulty. The patient tolerated the procedure well. Findings:      The Z-line was irregular and was found 39 cm from the incisors. Biopsies       were taken with a cold forceps for histology. 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      Localized minimal inflammation characterized by erythema and granularity       was found in the gastric antrum. Biopsies were taken with a cold forceps       for histology. Biopsies were taken with a cold forceps for Helicobacter       pylori testing.      The examined duodenum was normal. Impression:        - Z-line irregular, 39 cm from the incisors. Biopsied.                    - Gastritis. Biopsied.                    - Normal examined duodenum. Recommendation:    - Await  pathology results. Manya Silvas, MD 04/11/2015 11:07:45 AM This report has been signed electronically. Number of Addenda: 0 Note Initiated On: 04/11/2015 10:42 AM      [facility]

## 2015-04-11 NOTE — H&P (Signed)
Primary Care Physician:  Pcp Not In System Primary Gastroenterologist:  Dr. Vira Agar  Pre-Procedure History & Physical: HPI:  Howard Murphy is a 59 y.o. male is here for an endoscopy and colonoscopy.   Past Medical History  Diagnosis Date  . Diabetes mellitus without complication ([place])   . Hypertension   . GERD (gastroesophageal reflux disease)   . Arthritis   . Hyperlipemia   . Nephrolithiasis   . Shortness of breath dyspnea     Past Surgical History  Procedure Laterality Date  . Cholecystectomy  2000  . Back surgery  2015  . Shoulder surgery Right 2016    Prior to Admission medications   Medication Sig Start Date End Date Taking? Authorizing Provider  chlorthalidone (HYGROTON) 50 MG tablet Take 50 mg by mouth daily. 01/13/15  Yes Historical Provider, MD  lisinopril (PRINIVIL,ZESTRIL) 20 MG tablet Take 20 mg by mouth daily. 10/19/14  Yes Historical Provider, MD  atorvastatin (LIPITOR) 40 MG tablet Take 40 mg by mouth daily. 10/05/14   Historical Provider, MD  cetirizine (ZYRTEC) 10 MG chewable tablet Chew 10 mg by mouth daily.    Historical Provider, MD  metFORMIN (GLUCOPHAGE) 1000 MG tablet Take 1,000 mg by mouth 2 (two) times daily. 10/05/14   Historical Provider, MD  omeprazole (PRILOSEC) 40 MG capsule Take 40 mg by mouth 2 (two) times daily. 02/07/15   Historical Provider, MD  oxyCODONE-acetaminophen (PERCOCET) 5-325 MG per tablet Take 1 tablet by mouth every 8 (eight) hours as needed. 02/09/15   Historical Provider, MD  promethazine (PHENERGAN) 25 MG tablet Take 1 tablet (25 mg total) by mouth every 6 (six) hours as needed for nausea. Patient not taking: Reported on 04/11/2015 02/24/15   [name], MD  tamsulosin (FLOMAX) 0.4 MG CAPS capsule Take 1 capsule (0.4 mg total) by mouth daily. 03/07/15   Bjorn Loser, MD  tamsulosin (FLOMAX) 0.4 MG CAPS capsule Take 1 capsule (0.4 mg total) by mouth daily. 03/08/15   Nori Riis, PA-C  testosterone cypionate  (DEPOTESTOSTERONE CYPIONATE) 200 MG/ML injection Inject 200 mg into the muscle every 14 (fourteen) days. 05/12/12   Historical Provider, MD    Allergies as of 03/15/2015  . (No Known Allergies)    Family History  Problem Relation Age of Onset  . Bladder Cancer Neg Hx   . Prostate cancer Neg Hx   . Kidney cancer Neg Hx     Social History   Social History  . Marital Status: Single    Spouse Name: N/A  . Number of Children: N/A  . Years of Education: N/A   Occupational History  . Not on file.   Social History Main Topics  . Smoking status: Never Smoker   . Smokeless tobacco: Never Used  . Alcohol Use: No  . Drug Use: No  . Sexual Activity: Not on file   Other Topics Concern  . Not on file   Social History Narrative    Review of Systems: See HPI, otherwise negative ROS  Physical Exam: BP 119/93 mmHg  Pulse 80  Temp(Src) 98.3 F (36.8 C) (Oral)  Resp 18  Ht 5\' 7"  (1.702 m)  Wt 83.915 kg (185 lb)  BMI 28.97 kg/m2  SpO2 95% General:   Alert,  pleasant and cooperative in NAD Head:  Normocephalic and atraumatic. Neck:  Supple; no masses or thyromegaly. Lungs:  Clear throughout to auscultation.    Heart:  Regular rate and rhythm. Abdomen:  Soft, nontender and nondistended. Normal bowel  sounds, without guarding, and without rebound.   Neurologic:  Alert and  oriented x4;  grossly normal neurologically.  Impression/Plan: [name] is here for an endoscopy and colonoscopy to be performed for abdominal pain, GERD  Risks, benefits, limitations, and alternatives regarding  endoscopy and colonoscopy have been reviewed with the patient.  Questions have been answered.  All parties agreeable.   Gaylyn Cheers, MD  04/11/2015, 10:52 AM

## 2015-04-11 NOTE — Transfer of Care (Signed)
Immediate Anesthesia Transfer of Care Note  Patient: Howard Murphy  Procedure(s) Performed: Procedure(s): COLONOSCOPY WITH PROPOFOL (N/A) ESOPHAGOGASTRODUODENOSCOPY (EGD) WITH PROPOFOL (N/A)  Patient Location: PACU  Anesthesia Type:General  Level of Consciousness: awake and sedated  Airway & Oxygen Therapy: Patient Spontanous Breathing  Post-op Assessment: Report given to RN and Post -op Vital signs reviewed and stable  Post vital signs: Reviewed and stable  Last Vitals:  Filed Vitals:   04/11/15 0949  BP: 119/93  Pulse: 80  Temp: 36.8 C  Resp: 18    Complications: No apparent anesthesia complications

## 2015-04-12 ENCOUNTER — Encounter: Payer: Self-pay | Admitting: Unknown Physician Specialty

## 2015-04-12 LAB — SURGICAL PATHOLOGY

## 2015-04-13 ENCOUNTER — Emergency Department
Admission: EM | Admit: 2015-04-13 | Discharge: 2015-04-14 | Disposition: A | Discharge status: Home / Self Care | Payer: Medicare Other | Attending: Emergency Medicine | Admitting: Emergency Medicine

## 2015-04-13 ENCOUNTER — Encounter: Payer: Self-pay | Admitting: Urgent Care

## 2015-04-13 ENCOUNTER — Emergency Department: Payer: Medicare Other

## 2015-04-13 DIAGNOSIS — E119 Type 2 diabetes mellitus without complications: Secondary | ICD-10-CM | POA: Diagnosis not present

## 2015-04-13 DIAGNOSIS — R1011 Right upper quadrant pain: Secondary | ICD-10-CM

## 2015-04-13 DIAGNOSIS — K2970 Gastritis, unspecified, without bleeding: Secondary | ICD-10-CM | POA: Diagnosis not present

## 2015-04-13 DIAGNOSIS — Z79899 Other long term (current) drug therapy: Secondary | ICD-10-CM | POA: Diagnosis not present

## 2015-04-13 DIAGNOSIS — I10 Essential (primary) hypertension: Secondary | ICD-10-CM | POA: Diagnosis not present

## 2015-04-13 LAB — COMPREHENSIVE METABOLIC PANEL
ALBUMIN: 4.3 g/dL (ref 3.5–5.0)
ALK PHOS: 45 U/L (ref 38–126)
ALT: 48 U/L (ref 17–63)
AST: 28 U/L (ref 15–41)
Anion gap: 7 (ref 5–15)
BILIRUBIN TOTAL: 0.9 mg/dL (ref 0.3–1.2)
BUN: 21 mg/dL — AB (ref 6–20)
CHLORIDE: 102 mmol/L (ref 101–111)
CO2: 27 mmol/L (ref 22–32)
CREATININE: 1.71 mg/dL — AB (ref 0.61–1.24)
Calcium: 9.7 mg/dL (ref 8.9–10.3)
GFR calc Af Amer: 49 mL/min — ABNORMAL LOW (ref >60)
GFR, EST NON AFRICAN AMERICAN: 42 mL/min — AB (ref >60)
Glucose, Bld: 126 mg/dL — ABNORMAL HIGH (ref 65–99)
Potassium: 3.2 mmol/L — ABNORMAL LOW (ref 3.5–5.1)
Sodium: 136 mmol/L (ref 135–145)
Total Protein: 7.7 g/dL (ref 6.5–8.1)

## 2015-04-13 LAB — URINALYSIS COMPLETE WITH MICROSCOPIC (ARMC ONLY)
BILIRUBIN URINE: NEGATIVE
Bacteria, UA: NONE SEEN
GLUCOSE, UA: NEGATIVE mg/dL
Ketones, ur: NEGATIVE mg/dL
LEUKOCYTES UA: NEGATIVE
Nitrite: NEGATIVE
Protein, ur: NEGATIVE mg/dL
Specific Gravity, Urine: 1.01 (ref 1.005–1.030)
pH: 5 (ref 5.0–8.0)

## 2015-04-13 LAB — CBC
HCT: 51.9 % (ref 40.0–52.0)
Hemoglobin: 17.1 g/dL (ref 13.0–18.0)
MCH: 28.7 pg (ref 26.0–34.0)
MCHC: 32.9 g/dL (ref 32.0–36.0)
MCV: 87.2 fL (ref 80.0–100.0)
PLATELETS: 215 10*3/uL (ref 150–440)
RBC: 5.96 MIL/uL — ABNORMAL HIGH (ref 4.40–5.90)
RDW: 14.3 % (ref 11.5–14.5)
WBC: 11.8 10*3/uL — AB (ref 3.8–10.6)

## 2015-04-13 LAB — LIPASE, BLOOD: Lipase: 31 U/L (ref 11–51)

## 2015-04-13 MED ORDER — MORPHINE SULFATE (PF) 4 MG/ML IV SOLN
4.0000 mg | Freq: Once | INTRAVENOUS | Status: AC
  Administered 2015-04-13: 4 mg via INTRAVENOUS
  Filled 2015-04-13: qty 1

## 2015-04-13 MED ORDER — IOHEXOL 300 MG/ML  SOLN
75.0000 mL | Freq: Once | INTRAMUSCULAR | Status: AC | PRN
  Administered 2015-04-13: 75 mL via INTRAVENOUS

## 2015-04-13 MED ORDER — IOHEXOL 240 MG/ML SOLN
25.0000 mL | Freq: Once | INTRAMUSCULAR | Status: AC | PRN
  Administered 2015-04-13: 25 mL via ORAL

## 2015-04-13 MED ORDER — SODIUM CHLORIDE 0.9 % IV BOLUS (SEPSIS)
1000.0000 mL | Freq: Once | INTRAVENOUS | Status: AC
  Administered 2015-04-14: 1000 mL via INTRAVENOUS

## 2015-04-13 MED ORDER — SODIUM CHLORIDE 0.9 % IV BOLUS (SEPSIS)
1000.0000 mL | Freq: Once | INTRAVENOUS | Status: AC
  Administered 2015-04-13: 1000 mL via INTRAVENOUS

## 2015-04-13 MED ORDER — ONDANSETRON HCL 4 MG/2ML IJ SOLN
4.0000 mg | Freq: Once | INTRAMUSCULAR | Status: AC
Start: 2015-04-13 — End: 2015-04-13
  Administered 2015-04-13: 4 mg via INTRAVENOUS
  Filled 2015-04-13: qty 2

## 2015-04-13 NOTE — ED Notes (Addendum)
Patient presents with RUQ abd pain for months, however worse tonight. Patient has UGI and colonoscopy on Monday; has Bx done. Patient denies N/V/D/F. Patient reporting SOB that and been going on for over 6 months. Patient reports taking APAP 45-28mins PTA; presents with temp of 99.7 and reports chills at home.

## 2015-04-13 NOTE — ED Provider Notes (Signed)
[facility] Emergency Department Provider Note  ____________________________________________  Time seen: 11:10 PM  I have reviewed the triage vital signs and the nursing notes.   HISTORY  Chief Complaint Abdominal Pain      HPI Howard Murphy is a 59 y.o. male presents with 6 months history of right upper quadrant abdominal pain for which she's been evaluated before in the emergency department as well as by Dr. Vira Agar who performed a upper GI as well as colonoscopy this past Monday with biopsies performed. Patient now presents to the emergency department 8 out of 10 right upper/flank pain with acute worsening tonight possibly 45 minutes or presentation. Patient also reports chills at home with subjective fevers times on presentation to emergency department 99.7. Patient denies any vomiting no diarrhea or dysuria.    Past Medical History  Diagnosis Date  . Diabetes mellitus without complication ([place])   . Hypertension   . GERD (gastroesophageal reflux disease)   . Arthritis   . Hyperlipemia   . Nephrolithiasis   . Shortness of breath dyspnea     Patient Active Problem List   Diagnosis Date Noted  . Breath shortness 03/22/2015  . Diabetes ([place]) 07/18/2013  . Elevated hemoglobin ([place]) 07/07/2013  . Ache in joint 07/07/2013  . Arthritis, degenerative 07/07/2013  . Lumbar canal stenosis 05/29/2013  . Synovial cyst of lumbar spine 05/29/2013  . Back ache 05/05/2013  . 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 Acid reflux 05/05/2013  . HLD (hyperlipidemia) 05/05/2013  . BP (high blood pressure) 05/08/2011  . Hypercholesteremia 05/08/2011  . Arthritis or polyarthritis, rheumatoid ([place]) 05/08/2011  . Rheumatoid arthritis ([place]) 05/08/2011    Past Surgical History  Procedure Laterality Date  . Cholecystectomy  2000  . Back surgery  2015  . Shoulder surgery Right 2016  . Colonoscopy with propofol N/A 04/11/2015    Procedure: COLONOSCOPY WITH  PROPOFOL;  Surgeon: Manya Silvas, MD;  Location: [facility] ENDOSCOPY;  Service: Endoscopy;  Laterality: N/A;  . Esophagogastroduodenoscopy (egd) with propofol N/A 04/11/2015    Procedure: ESOPHAGOGASTRODUODENOSCOPY (EGD) WITH PROPOFOL;  Surgeon: Manya Silvas, MD;  Location: [facility] ENDOSCOPY;  Service: Endoscopy;  Laterality: N/A;    Current Outpatient Rx  Name  Route  Sig  Dispense  Refill  . atorvastatin (LIPITOR) 40 MG tablet   Oral   Take 40 mg by mouth daily.         . cetirizine (ZYRTEC) 10 MG chewable tablet   Oral   Chew 10 mg by mouth daily.         . chlorthalidone (HYGROTON) 50 MG tablet   Oral   Take 50 mg by mouth daily.         Marland Kitchen lisinopril (PRINIVIL,ZESTRIL) 20 MG tablet   Oral   Take 20 mg by mouth daily.         . metFORMIN (GLUCOPHAGE) 1000 MG tablet   Oral   Take 1,000 mg by mouth 2 (two) times daily.         Marland Kitchen omeprazole (PRILOSEC) 40 MG capsule   Oral   Take 40 mg by mouth 2 (two) times daily.         Marland Kitchen oxyCODONE-acetaminophen (PERCOCET) 5-325 MG per tablet   Oral   Take 1 tablet by mouth every 8 (eight) hours as needed.         . promethazine (PHENERGAN) 25 MG tablet   Oral   Take 1 tablet (25 mg total) by mouth every  6 (six) hours as needed for nausea. Patient not taking: Reported on 04/11/2015   30 tablet   0   . tamsulosin (FLOMAX) 0.4 MG CAPS capsule   Oral   Take 1 capsule (0.4 mg total) by mouth daily.   30 capsule   11   . tamsulosin (FLOMAX) 0.4 MG CAPS capsule   Oral   Take 1 capsule (0.4 mg total) by mouth daily.   30 capsule   3   . testosterone cypionate (DEPOTESTOSTERONE CYPIONATE) 200 MG/ML injection   Intramuscular   Inject 200 mg into the muscle every 14 (fourteen) days.           Allergies Review of patient's allergies indicates no known allergies.  Family History  Problem Relation Age of Onset  . Bladder Cancer Neg Hx   . Prostate cancer Neg Hx   . Kidney cancer Neg Hx     Social  History Social History  Substance Use Topics  . Smoking status: Never Smoker   . Smokeless tobacco: Never Used  . Alcohol Use: No    Review of Systems  Constitutional: Negative for fever. Positive for chills Eyes: Negative for visual changes. ENT: Negative for sore throat. Cardiovascular: Negative for chest pain. Respiratory: Negative for shortness of breath. Gastrointestinal: Positive for abdominal pain Genitourinary: Negative for dysuria. Musculoskeletal: Negative for back pain. Skin: Negative for rash. Neurological: Negative for headaches, focal weakness or numbness.     10-point ROS otherwise negative.  ____________________________________________   PHYSICAL EXAM:  VITAL SIGNS: ED Triage Vitals  Enc Vitals Group     BP 04/13/15 1955 140/89 mmHg     Pulse Rate 04/13/15 1955 92     Resp 04/13/15 1955 18     Temp 04/13/15 1955 99.7 F (37.6 C)     Temp Source 04/13/15 1955 Oral     SpO2 04/13/15 1955 96 %     Weight 04/13/15 1955 185 lb (83.915 kg)     Height 04/13/15 1955 5\' 7"  (1.702 m)     Head Cir --      Peak Flow --      Pain Score 04/13/15 1955 9     Pain Loc --      Pain Edu? --      Excl. in [place]? --      Constitutional: Alert and oriented. Well appearing and in no distress. Eyes: Conjunctivae are normal. PERRL. Normal extraocular movements. ENT   Head: Normocephalic and atraumatic.   Nose: No congestion/rhinnorhea.   Mouth/Throat: Mucous membranes are moist.   Neck: No stridor. Hematological/Lymphatic/Immunilogical: No cervical lymphadenopathy. Cardiovascular: Normal rate, regular rhythm. Normal and symmetric distal pulses are present in all extremities. No murmurs, rubs, or gallops. Respiratory: Normal respiratory effort without tachypnea nor retractions. Breath sounds are clear and equal bilaterally. No wheezes/rales/rhonchi. Gastrointestinal: Tenderness to palpation bilateral lower quadrants of the abdomen.. No distention. There is  no CVA tenderness. Genitourinary: deferred Musculoskeletal: Nontender with normal range of motion in all extremities. No joint effusions.  No lower extremity tenderness nor edema. Neurologic:  Normal speech and language. No gross focal neurologic deficits are appreciated. Speech is normal.  Skin:  Skin is warm, dry and intact. No rash noted. Psychiatric: Mood and affect are normal. Speech and behavior are normal. Patient exhibits appropriate insight and judgment.  ____________________________________________    LABS (pertinent positives/negatives)  Labs Reviewed  COMPREHENSIVE METABOLIC PANEL - Abnormal; Notable for the following:    Potassium 3.2 (*)    Glucose, Bld  126 (*)    BUN 21 (*)    Creatinine, Ser 1.71 (*)    GFR calc non Af Amer 42 (*)    GFR calc Af Amer 49 (*)    All other components within normal limits  CBC - Abnormal; Notable for the following:    WBC 11.8 (*)    RBC 5.96 (*)    All other components within normal limits  URINALYSIS COMPLETEWITH MICROSCOPIC ([facility] ONLY) - Abnormal; Notable for the following:    Color, Urine STRAW (*)    APPearance CLEAR (*)    Hgb urine dipstick 1+ (*)    Squamous Epithelial / LPF 0-5 (*)    All other components within normal limits  LIPASE, BLOOD  FIBRIN DERIVATIVES D-DIMER ([facility] ONLY)    ____________________________________________   EKG  ED ECG REPORT I, Sundeep [place], [place], the attending physician, personally viewed and interpreted this ECG.   Date: 04/14/2015  EKG Time: 8:11 PM  Rate: 97  Rhythm: Normal sinus rhythm  Axis: None  Intervals: Normal  ST&T Change: None  ____________________________________________    RADIOLOGY  CT Abdomen Pelvis W Contrast (Final result) Result time: 04/13/15 22:12:47   Final result by Rad Results In Interface (04/13/15 22:12:47)   Narrative:   CLINICAL DATA: Patient presents with RUQ abd pain for months, however worse tonight. Patient has UGI and colonoscopy on  Monday; has Bx done. Patient denies N/V/D/F. Patient reporting SOB that and been going on for over 6 months. Fever and chills.  EXAM: CT ABDOMEN AND PELVIS WITH CONTRAST  TECHNIQUE: Multidetector CT imaging of the abdomen and pelvis was performed using the standard protocol following bolus administration of intravenous contrast.  CONTRAST: 49mL OMNIPAQUE IOHEXOL 300 MG/ML SOLN  COMPARISON: 02/24/2015  FINDINGS: Atelectasis or consolidation in the right lung base.  Diffuse fatty infiltration of the liver. No focal liver lesions identified. Surgical absence of the gallbladder. No bile duct dilatation. The pancreas, spleen, adrenal glands, abdominal aorta, inferior vena cava, and retroperitoneal lymph nodes are unremarkable. Low-attenuation 15 mm lesion in the midportion of the right kidney posteriorly appears to have been present previously, and demonstrates low-attenuation Hounsfield units. This is likely a cyst. The stomach, small bowel, and colon are not abnormally distended. Diffusely stool-filled colon. No colonic or bowel wall thickening is appreciated. No free air or free fluid in the abdomen. Abdominal wall musculature appears intact.  Pelvis: Appendix is normal. Bladder wall is not thickened. Calcified prostate gland with mild enlargement. No significant pelvic lymphadenopathy. No free or loculated pelvic fluid collections. Degenerative changes in the spine. No destructive bone lesions.  IMPRESSION: No acute process demonstrated in the abdomen or pelvis. No evidence of bowel obstruction, inflammation, or perforation. Diffuse fatty infiltration of the liver. Small cyst in the right kidney.   Electronically Signed By: Lucienne Capers M.D. On: 04/13/2015 22:12             INITIAL IMPRESSION / ASSESSMENT AND PLAN / ED COURSE  Pertinent labs & imaging results that were available during my care of the patient were reviewed by me and considered in my  medical decision making (see chart for details).  Patient received both IV morphine 4 mg and Dilaudid 1 mg for analgesia with improvement of pain. Patient's CT scan of the abdomen revealed no gross abnormality to explain the patient's pain. Review of the patient's endoscopy revealed gastritis  ____________________________________________   FINAL CLINICAL IMPRESSION(S) / ED DIAGNOSES  Final diagnoses:  Right upper quadrant pain  Gastritis  Gregor Hams, MD 04/14/15 [phone]

## 2015-04-14 LAB — FIBRIN DERIVATIVES D-DIMER (ARMC ONLY): Fibrin derivatives D-dimer (ARMC): 371 (ref 0–499)

## 2015-04-14 MED ORDER — OXYCODONE-ACETAMINOPHEN 5-325 MG PO TABS: 1.0000 | ORAL_TABLET | Freq: Three times a day (TID) | ORAL | Status: DC | PRN

## 2015-04-14 MED ORDER — HYDROMORPHONE HCL 1 MG/ML IJ SOLN
INTRAMUSCULAR | Status: AC
  Filled 2015-04-14: qty 1

## 2015-04-14 MED ORDER — HYDROMORPHONE HCL 1 MG/ML IJ SOLN
1.0000 mg | Freq: Once | INTRAMUSCULAR | Status: AC
  Administered 2015-04-14: 1 mg via INTRAVENOUS

## 2015-04-14 MED ORDER — OXYCODONE-ACETAMINOPHEN 5-325 MG PO TABS: 1.0000 | ORAL_TABLET | ORAL | Status: DC | PRN

## 2015-04-14 NOTE — Discharge Instructions (Signed)
Abdominal Pain, Adult Many things can cause abdominal pain. Usually, abdominal pain is not caused by a disease and will improve without treatment. It can often be observed and treated at home. Your health care provider will do a physical exam and possibly order blood tests and X-rays to help determine the seriousness of your pain. However, in many cases, more time must pass before a clear cause of the pain can be found. Before that point, your health care provider may not know if you need more testing or further treatment. HOME CARE INSTRUCTIONS Monitor your abdominal pain for any changes. The following actions may help to alleviate any discomfort you are experiencing:  Only take over-the-counter or prescription medicines as directed by your health care provider.  Do not take laxatives unless directed to do so by your health care provider.  Try a clear liquid diet (broth, tea, or water) as directed by your health care provider. Slowly move to a bland diet as tolerated. SEEK MEDICAL CARE IF:  You have unexplained abdominal pain.  You have abdominal pain associated with nausea or diarrhea.  You have pain when you urinate or have a bowel movement.  You experience abdominal pain that wakes you in the night.  You have abdominal pain that is worsened or improved by eating food.  You have abdominal pain that is worsened with eating fatty foods.  You have a fever. SEEK IMMEDIATE MEDICAL CARE IF:  Your pain does not go away within 2 hours.  You keep throwing up (vomiting).  Your pain is felt only in portions of the abdomen, such as the right side or the left lower portion of the abdomen.  You pass bloody or black tarry stools. MAKE SURE YOU:  Understand these instructions.  Will watch your condition.  Will get help right away if you are not doing well or get worse.   This information is not intended to replace advice given to you by your health care provider. Make sure you discuss  any questions you have with your health care provider.   Document Released: 03/07/2005 Document Revised: 02/16/2015 Document Reviewed: 02/04/2013 Elsevier Interactive Patient Education 2016 Elsevier Inc.  Gastritis, Adult Gastritis is soreness and swelling (inflammation) of the lining of the stomach. Gastritis can develop as a sudden onset (acute) or long-term (chronic) condition. If gastritis is not treated, it can lead to stomach bleeding and ulcers. CAUSES  Gastritis occurs when the stomach lining is weak or damaged. Digestive juices from the stomach then inflame the weakened stomach lining. The stomach lining may be weak or damaged due to viral or bacterial infections. One common bacterial infection is the Helicobacter pylori infection. Gastritis can also result from excessive alcohol consumption, taking certain medicines, or having too much acid in the stomach.  SYMPTOMS  In some cases, there are no symptoms. When symptoms are present, they may include:  Pain or a burning sensation in the upper abdomen.  Nausea.  Vomiting.  An uncomfortable feeling of fullness after eating. DIAGNOSIS  Your caregiver may suspect you have gastritis based on your symptoms and a physical exam. To determine the cause of your gastritis, your caregiver may perform the following:  Blood or stool tests to check for the H pylori bacterium.  Gastroscopy. A thin, flexible tube (endoscope) is passed down the esophagus and into the stomach. The endoscope has a light and camera on the end. Your caregiver uses the endoscope to view the inside of the stomach.  Taking a tissue sample (  biopsy) from the stomach to examine under a microscope. TREATMENT  Depending on the cause of your gastritis, medicines may be prescribed. If you have a bacterial infection, such as an H pylori infection, antibiotics may be given. If your gastritis is caused by too much acid in the stomach, H2 blockers or antacids may be given. Your  caregiver may recommend that you stop taking aspirin, ibuprofen, or other nonsteroidal anti-inflammatory drugs (NSAIDs). HOME CARE INSTRUCTIONS  Only take over-the-counter or prescription medicines as directed by your caregiver.  If you were given antibiotic medicines, take them as directed. Finish them even if you start to feel better.  Drink enough fluids to keep your urine clear or pale yellow.  Avoid foods and drinks that make your symptoms worse, such as:  Caffeine or alcoholic drinks.  Chocolate.  Peppermint or mint flavorings.  Garlic and onions.  Spicy foods.  Citrus fruits, such as oranges, lemons, or limes.  Tomato-based foods such as sauce, chili, salsa, and pizza.  Fried and fatty foods.  Eat small, frequent meals instead of large meals. SEEK IMMEDIATE MEDICAL CARE IF:   You have black or dark red stools.  You vomit blood or material that looks like coffee grounds.  You are unable to keep fluids down.  Your abdominal pain gets worse.  You have a fever.  You do not feel better after 1 week.  You have any other questions or concerns. MAKE SURE YOU:  Understand these instructions.  Will watch your condition.  Will get help right away if you are not doing well or get worse.   This information is not intended to replace advice given to you by your health care provider. Make sure you discuss any questions you have with your health care provider.   Document Released: 05/22/2001 Document Revised: 11/27/2011 Document Reviewed: 07/11/2011 Elsevier Interactive Patient Education [employer].

## 2015-04-14 NOTE — ED Notes (Signed)
Pt discharged to home, VSS, with family.  Pin mgmt discussed with patient.  All instructions reviewed

## 2015-04-15 ENCOUNTER — Ambulatory Visit (INDEPENDENT_AMBULATORY_CARE_PROVIDER_SITE_OTHER): Payer: Medicare Other | Admitting: Urology

## 2015-04-15 ENCOUNTER — Encounter: Payer: Self-pay | Admitting: Urology

## 2015-04-15 VITALS — BP 103/65 | HR 69 | Ht 67.0 in | Wt 188.0 lb

## 2015-04-15 DIAGNOSIS — N281 Cyst of kidney, acquired: Secondary | ICD-10-CM

## 2015-04-15 DIAGNOSIS — Q6100 Congenital renal cyst, unspecified: Secondary | ICD-10-CM

## 2015-04-15 DIAGNOSIS — R748 Abnormal levels of other serum enzymes: Secondary | ICD-10-CM

## 2015-04-15 DIAGNOSIS — R7989 Other specified abnormal findings of blood chemistry: Secondary | ICD-10-CM

## 2015-04-15 NOTE — Progress Notes (Signed)
04/15/2015 2:01 PM   Howard Murphy 17-Nov-1955 [id number]  Referring provider: [facility] [address] [place], [place] [address]  Chief Complaint  Patient presents with  . Benign Prostatic Hypertrophy    recheck from starting flomax    HPI: The patient is a 59 year old male who presents with a 15 mm cyst on his right kidney. It is a low-attenuation cyst Hounsfield units of 12.   He also was seen in the ER recently for right flank pain though no urological source this was seen in the CT scan. His creatinine was 1.7 also on this visit. It was 0.89 one month prior. There is no sign of hydronephrosis and CT scan. He is on Flomax as well and he is voiding well with that.   PMH: Past Medical History  Diagnosis Date  . Diabetes mellitus without complication ([place])   . Hypertension   . GERD (gastroesophageal reflux disease)   . Arthritis   . Hyperlipemia   . Nephrolithiasis   . Shortness of breath dyspnea     Surgical History: Past Surgical History  Procedure Laterality Date  . Cholecystectomy  2000  . Back surgery  2015  . Shoulder surgery Right 2016  . Colonoscopy with propofol N/A 04/11/2015    Procedure: COLONOSCOPY WITH PROPOFOL;  Surgeon: Manya Silvas, MD;  Location: [facility] ENDOSCOPY;  Service: Endoscopy;  Laterality: N/A;  . Esophagogastroduodenoscopy (egd) with propofol N/A 04/11/2015    Procedure: ESOPHAGOGASTRODUODENOSCOPY (EGD) WITH PROPOFOL;  Surgeon: Manya Silvas, MD;  Location[facility] ENDOSCOPY;  Service: Endoscopy;  Laterality: N/A;    Home Medications:    Medication List       This list is accurate as of: 04/15/15  2:01 PM.  Always use your most recent med list.               acetaminophen 500 MG tablet  Commonly known as:  TYLENOL  Take 500 mg by mouth every 6 (six) hours as needed.     atorvastatin 40 MG tablet  Commonly known as:  LIPITOR  Take 40 mg by mouth daily.     cetirizine 10 MG chewable tablet  Commonly  known as:  ZYRTEC  Chew 10 mg by mouth daily.     chlorthalidone 50 MG tablet  Commonly known as:  HYGROTON  Take 50 mg by mouth daily.     ibuprofen 600 MG tablet  Commonly known as:  ADVIL,MOTRIN  Take 1 tablet by mouth 3 (three) times daily.     lisinopril 20 MG tablet  Commonly known as:  PRINIVIL,ZESTRIL  Take 20 mg by mouth daily.     metFORMIN 1000 MG tablet  Commonly known as:  GLUCOPHAGE  Take 1,000 mg by mouth 2 (two) times daily.     omeprazole 40 MG capsule  Commonly known as:  PRILOSEC  Take 40 mg by mouth 2 (two) times daily.     oxyCODONE-acetaminophen 5-325 MG tablet  Commonly known as:  PERCOCET  Take 1 tablet by mouth every 8 (eight) hours as needed.     oxyCODONE-acetaminophen 5-325 MG tablet  Commonly known as:  ROXICET  Take 1 tablet by mouth every 4 (four) hours as needed for severe pain.     polyethylene glycol powder powder  Commonly known as:  GLYCOLAX/MIRALAX  Take 17 g by mouth daily as needed.     promethazine 25 MG tablet  Commonly known as:  PHENERGAN  Take 1 tablet (25 mg total)  by mouth every 6 (six) hours as needed for nausea.     sucralfate 1 G tablet  Commonly known as:  CARAFATE  Take 1 tablet by mouth 4 (four) times daily.     tamsulosin 0.4 MG Caps capsule  Commonly known as:  FLOMAX  Take 1 capsule (0.4 mg total) by mouth daily.     tamsulosin 0.4 MG Caps capsule  Commonly known as:  FLOMAX  Take 1 capsule (0.4 mg total) by mouth daily.     testosterone cypionate 200 MG/ML injection  Commonly known as:  DEPOTESTOSTERONE CYPIONATE  Inject 200 mg into the muscle every 14 (fourteen) days.        Allergies: No Known Allergies  Family History: Family History  Problem Relation Age of Onset  . Bladder Cancer Neg Hx   . Prostate cancer Neg Hx   . Kidney cancer Neg Hx     Social History:  reports that he has never smoked. He has never used smokeless tobacco. He reports that he does not drink alcohol or use illicit  drugs.  ROS: UROLOGY Frequent Urination?: Yes Hard to postpone urination?: No Burning/pain with urination?: No Get up at night to urinate?: Yes Leakage of urine?: No Urine stream starts and stops?: Yes Trouble starting stream?: No Do you have to strain to urinate?: No Blood in urine?: No Urinary tract infection?: No Sexually transmitted disease?: No Injury to kidneys or bladder?: No Painful intercourse?: No Weak stream?: Yes Erection problems?: No Penile pain?: No  Gastrointestinal Nausea?: Yes Vomiting?: No Indigestion/heartburn?: Yes Diarrhea?: Yes Constipation?: Yes  Constitutional Fever: Yes Night sweats?: Yes Weight loss?: Yes Fatigue?: Yes  Skin Skin rash/lesions?: No Itching?: No  Eyes Blurred vision?: Yes Double vision?: No  Ears/Nose/Throat Sore throat?: No Sinus problems?: No  Hematologic/Lymphatic Swollen glands?: No Easy bruising?: No  Cardiovascular Leg swelling?: No Chest pain?: No  Respiratory Cough?: No Shortness of breath?: Yes  Endocrine Excessive thirst?: Yes  Musculoskeletal Back pain?: Yes Joint pain?: Yes  Neurological Headaches?: Yes Dizziness?: Yes  Psychologic Depression?: No Anxiety?: No  Physical Exam: BP 103/65 mmHg  Pulse 69  Ht 5\' 7"  (1.702 m)  Wt 188 lb (85.276 kg)  BMI 29.44 kg/m2  Constitutional:  Alert and oriented, No acute distress. HEENT: [place] AT, moist mucus membranes.  Trachea midline, no masses. Cardiovascular: No clubbing, cyanosis, or edema. Respiratory: Normal respiratory effort, no increased work of breathing. GI: Abdomen is soft, nontender, nondistended, no abdominal masses GU: No CVA tenderness.  Skin: No rashes, bruises or suspicious lesions. Lymph: No cervical or inguinal adenopathy. Neurologic: Grossly intact, no focal deficits, moving all 4 extremities. Psychiatric: Normal mood and affect.  Laboratory Data: Lab Results  Component Value Date   WBC 11.8* 04/13/2015   HGB 17.1  04/13/2015   HCT 51.9 04/13/2015   MCV 87.2 04/13/2015   PLT 215 04/13/2015    Lab Results  Component Value Date   CREATININE 1.71* 04/13/2015    No results found for: PSA  No results found for: TESTOSTERONE  No results found for: HGBA1C  Urinalysis    Component Value Date/Time   COLORURINE STRAW* 04/13/2015 2014   COLORURINE Yellow 03/23/2014 2003   APPEARANCEUR CLEAR* 04/13/2015 2014   APPEARANCEUR Clear 03/23/2014 2003   LABSPEC 1.010 04/13/2015 2014   LABSPEC 1.015 03/23/2014 2003   PHURINE 5.0 04/13/2015 2014   PHURINE 6.0 03/23/2014 2003   GLUCOSEU NEGATIVE 04/13/2015 2014   GLUCOSEU 50 mg/dL 03/23/2014 2003   HGBUR 1+* 04/13/2015  2014   HGBUR Negative 03/23/2014 2003   [place] NEGATIVE 04/13/2015 2014   BILIRUBINUR Negative 03/07/2015 1419   BILIRUBINUR Negative 03/23/2014 2003   [place] NEGATIVE 04/13/2015 2014   KETONESUR Negative 03/23/2014 2003   PROTEINUR NEGATIVE 04/13/2015 2014   PROTEINUR Negative 03/23/2014 2003   UROBILINOGEN 1.0 02/23/2015 2347   NITRITE NEGATIVE 04/13/2015 2014   NITRITE Negative 03/07/2015 1419   NITRITE Negative 03/23/2014 2003   LEUKOCYTESUR NEGATIVE 04/13/2015 2014   LEUKOCYTESUR Negative 03/07/2015 1419   LEUKOCYTESUR Negative 03/23/2014 2003    Pertinent Imaging: IMPRESSION: No acute process demonstrated in the abdomen or pelvis. No evidence of bowel obstruction, inflammation, or perforation. Diffuse fatty infiltration of the liver. Small cyst in the right kidney.   Assessment & Plan:    1. Right renal cyst Due to motion artifact, it is unclear if this is a simple cyst. We will have him follow-up in 6 months with an ultrasound prior.  2. AKI We will recheck his creatinine today. If it remains elevated, we will send him to nephrology.  3. BPH The patient will continue Flomax. He will follow-up with a IPSS, uroflow, and PVR in 6 months.   Return for with renal u/s prior and IPSS, uroflow, PVR .  Nickie Retort, MD  [facility] [address], [place], [place] [address] [phone]

## 2015-04-16 LAB — BASIC METABOLIC PANEL
BUN / CREAT RATIO: 13 (ref 9–20)
BUN: 17 mg/dL (ref 6–24)
CHLORIDE: 96 mmol/L — AB (ref 97–106)
CO2: 27 mmol/L (ref 18–29)
Calcium: 10.6 mg/dL — ABNORMAL HIGH (ref 8.7–10.2)
Creatinine, Ser: 1.31 mg/dL — ABNORMAL HIGH (ref 0.76–1.27)
GFR calc non Af Amer: 59 mL/min/{1.73_m2} — ABNORMAL LOW (ref >59)
GFR, EST AFRICAN AMERICAN: 68 mL/min/{1.73_m2} (ref >59)
Glucose: 126 mg/dL — ABNORMAL HIGH (ref 65–99)
Potassium: 4.2 mmol/L (ref 3.5–5.2)
Sodium: 142 mmol/L (ref 136–144)

## 2015-04-18 ENCOUNTER — Other Ambulatory Visit: Payer: Self-pay

## 2015-04-18 DIAGNOSIS — S37009A Unspecified injury of unspecified kidney, initial encounter: Secondary | ICD-10-CM

## 2015-04-18 NOTE — Progress Notes (Signed)
Referral put in per Dr. Pilar Jarvis.

## 2015-04-19 ENCOUNTER — Other Ambulatory Visit: Payer: Self-pay

## 2015-04-19 ENCOUNTER — Ambulatory Visit: Payer: Medicare Other | Admitting: Family Medicine

## 2015-04-19 DIAGNOSIS — N281 Cyst of kidney, acquired: Secondary | ICD-10-CM

## 2015-04-25 ENCOUNTER — Ambulatory Visit: Payer: Medicare Other

## 2015-04-29 ENCOUNTER — Ambulatory Visit: Payer: Medicare Other | Admitting: Urology

## 2015-05-02 ENCOUNTER — Other Ambulatory Visit: Payer: Medicare Other

## 2015-05-03 ENCOUNTER — Ambulatory Visit
Admission: RE | Admit: 2015-05-03 | Discharge: 2015-05-03 | Disposition: A | Discharge status: Home / Self Care | Payer: Medicare Other | Source: Ambulatory Visit | Attending: Urology | Admitting: Urology

## 2015-05-03 DIAGNOSIS — N281 Cyst of kidney, acquired: Secondary | ICD-10-CM | POA: Insufficient documentation

## 2015-05-06 ENCOUNTER — Emergency Department
Admission: EM | Admit: 2015-05-06 | Discharge: 2015-05-07 | Disposition: A | Discharge status: Home / Self Care | Payer: Medicare Other | Attending: Emergency Medicine | Admitting: Emergency Medicine

## 2015-05-06 ENCOUNTER — Encounter: Payer: Self-pay | Admitting: Emergency Medicine

## 2015-05-06 ENCOUNTER — Emergency Department: Payer: Medicare Other

## 2015-05-06 DIAGNOSIS — E119 Type 2 diabetes mellitus without complications: Secondary | ICD-10-CM | POA: Insufficient documentation

## 2015-05-06 DIAGNOSIS — S43402A Unspecified sprain of left shoulder joint, initial encounter: Secondary | ICD-10-CM | POA: Insufficient documentation

## 2015-05-06 DIAGNOSIS — I10 Essential (primary) hypertension: Secondary | ICD-10-CM | POA: Insufficient documentation

## 2015-05-06 DIAGNOSIS — Z791 Long term (current) use of non-steroidal anti-inflammatories (NSAID): Secondary | ICD-10-CM | POA: Insufficient documentation

## 2015-05-06 DIAGNOSIS — Y9389 Activity, other specified: Secondary | ICD-10-CM | POA: Diagnosis not present

## 2015-05-06 DIAGNOSIS — W07XXXA Fall from chair, initial encounter: Secondary | ICD-10-CM | POA: Diagnosis not present

## 2015-05-06 DIAGNOSIS — Y998 Other external cause status: Secondary | ICD-10-CM | POA: Diagnosis not present

## 2015-05-06 DIAGNOSIS — Z7984 Long term (current) use of oral hypoglycemic drugs: Secondary | ICD-10-CM | POA: Insufficient documentation

## 2015-05-06 DIAGNOSIS — G8929 Other chronic pain: Secondary | ICD-10-CM | POA: Insufficient documentation

## 2015-05-06 DIAGNOSIS — Z79899 Other long term (current) drug therapy: Secondary | ICD-10-CM | POA: Diagnosis not present

## 2015-05-06 DIAGNOSIS — Y9289 Other specified places as the place of occurrence of the external cause: Secondary | ICD-10-CM | POA: Insufficient documentation

## 2015-05-06 DIAGNOSIS — S4992XA Unspecified injury of left shoulder and upper arm, initial encounter: Secondary | ICD-10-CM | POA: Diagnosis present

## 2015-05-06 MED ORDER — OXYCODONE-ACETAMINOPHEN 5-325 MG PO TABS: 1.0000 | ORAL_TABLET | Freq: Four times a day (QID) | ORAL | Status: DC | PRN

## 2015-05-06 MED ORDER — ONDANSETRON HCL 4 MG PO TABS
4.0000 mg | ORAL_TABLET | Freq: Once | ORAL | Status: AC
  Administered 2015-05-06: 4 mg via ORAL
  Filled 2015-05-06: qty 1

## 2015-05-06 MED ORDER — MORPHINE SULFATE (PF) 4 MG/ML IV SOLN
4.0000 mg | Freq: Once | INTRAVENOUS | Status: AC
  Administered 2015-05-06: 4 mg via INTRAMUSCULAR
  Filled 2015-05-06: qty 1

## 2015-05-06 NOTE — Discharge Instructions (Signed)
Shoulder Sprain °A shoulder sprain is a partial or complete tear in one of the tough, fiber-like tissues (ligaments) in the shoulder. The ligaments in the shoulder help to hold the shoulder in place. °CAUSES °This condition may be caused by: °· A fall. °· A hit to the shoulder. °· A twist of the arm. °RISK FACTORS °This condition is more likely to develop in: °· People who play sports. °· People who have problems with balance or coordination. °SYMPTOMS °Symptoms of this condition include: °· Pain when moving the shoulder. °· Limited ability to move the shoulder. °· Swelling and tenderness on top of the shoulder. °· Warmth in the shoulder. °· A change in the shape of the shoulder. °· Redness or bruising on the shoulder. °DIAGNOSIS °This condition is diagnosed with a physical exam. During the exam, you may be asked to do simple exercises with your shoulder. You may also have imaging tests, such as X-rays, MRI, or a CT scan. These tests can show how severe the sprain is. °TREATMENT °This condition may be treated with: °· Rest. °· Pain medicine. °· Ice. °· A sling or brace. This is used to keep the arm still while the shoulder is healing. °· Physical therapy or rehabilitation exercises. These help to improve the range of motion and strength of the shoulder. °· Surgery (rare). Surgery may be needed if the sprain caused a joint to become unstable. Surgery may also be needed to reduce pain. °Some people may develop ongoing shoulder pain or lose some range of motion in the shoulder. However, most people do not develop long-term problems. °HOME CARE INSTRUCTIONS °· Rest. °· Take over-the-counter and prescription medicines only as told by your health care provider. °· If directed, apply ice to the area: °¨ Put ice in a plastic bag. °¨ Place a towel between your skin and the bag. °¨ Leave the ice on for 20 minutes, 2-3 times per day. °· If you were given a shoulder sling or brace: °¨ Wear it as told. °¨ Remove it to shower or  bathe. °¨ Move your arm only as much as told by your health care provider, but keep your hand moving to prevent swelling. °· If you were shown how to do any exercises, do them as told by your health care provider. °· Keep all follow-up visits as told by your health care provider. This is important. °SEEK MEDICAL CARE IF: °· Your pain gets worse. °· Your pain is not relieved with medicines. °· You have increased redness or swelling. °SEEK IMMEDIATE MEDICAL CARE IF: °· You have a fever. °· You cannot move your arm or shoulder. °· You develop numbness or tingling in your arms, hands, or fingers. °  °This information is not intended to replace advice given to you by your health care provider. Make sure you discuss any questions you have with your health care provider. °  °Document Released: 10/14/2008 Document Revised: 02/16/2015 Document Reviewed: 09/20/2014 °Elsevier Interactive Patient Education ©2016 [employer] ° °

## 2015-05-06 NOTE — ED Notes (Signed)
Pt presents to ED via POV from personal home with c/o of left shoulder injury due to an acute fall episode. Pt states he was sitting in a chair when the chair collapsed and he fell on affected side to the hardwood porch floor. Pt denies LOC and blurry vision. No bleeding noted to area. Swelling noted to left shoulder. Pt alert and oriented x4, ambulatory to treatment room.

## 2015-05-06 NOTE — ED Provider Notes (Signed)
[facility] Emergency Department Provider Note  ____________________________________________  Time seen: On arrival  I have reviewed the triage vital signs and the nursing notes.   HISTORY  Chief Complaint Shoulder Injury    HPI Howard Murphy is a 59 y.o. male who presents with complaints of left shoulder pain after a fall. Patient reports he sat on a chair which collapsed underneath him and he landed on his left shoulder. He reports he recently had an MRI to see about chronic shoulder pain which was thought to be related to a torn rotator cuff and he thinks he has made injury worse. He cannot move his arm without significant pain. No other injuries reported    Past Medical History  Diagnosis Date  . Diabetes mellitus without complication ([place])   . Hypertension   . GERD (gastroesophageal reflux disease)   . Arthritis   . Hyperlipemia   . Nephrolithiasis   . Shortness of breath dyspnea     Patient Active Problem List   Diagnosis Date Noted  . Breath shortness 03/22/2015  . Diabetes ([place]) 07/18/2013  . Elevated hemoglobin ([place]) 07/07/2013  . Ache in joint 07/07/2013  . Arthritis, degenerative 07/07/2013  . Lumbar canal stenosis 05/29/2013  . Synovial cyst of lumbar spine 05/29/2013  . Back ache 05/05/2013  . 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 Acid reflux 05/05/2013  . HLD (hyperlipidemia) 05/05/2013  . BP (high blood pressure) 05/08/2011  . Hypercholesteremia 05/08/2011  . 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 Rheumatoid arthritis ([place]) 05/08/2011    Past Surgical History  Procedure Laterality Date  . Cholecystectomy  2000  . Back surgery  2015  . Shoulder surgery Right 2016  . Colonoscopy with propofol N/A 04/11/2015    Procedure: COLONOSCOPY WITH PROPOFOL;  Surgeon: Manya Silvas, MD;  Location: [facility] ENDOSCOPY;  Service: Endoscopy;  Laterality: N/A;  . Esophagogastroduodenoscopy (egd) with propofol N/A  04/11/2015    Procedure: ESOPHAGOGASTRODUODENOSCOPY (EGD) WITH PROPOFOL;  Surgeon: Manya Silvas, MD;  Location: [facility] ENDOSCOPY;  Service: Endoscopy;  Laterality: N/A;    Current Outpatient Rx  Name  Route  Sig  Dispense  Refill  . acetaminophen (TYLENOL) 500 MG tablet   Oral   Take 500 mg by mouth every 6 (six) hours as needed.         Marland Kitchen atorvastatin (LIPITOR) 40 MG tablet   Oral   Take 40 mg by mouth daily.         . cetirizine (ZYRTEC) 10 MG chewable tablet   Oral   Chew 10 mg by mouth daily.         . chlorthalidone (HYGROTON) 50 MG tablet   Oral   Take 50 mg by mouth daily.         Marland Kitchen ibuprofen (ADVIL,MOTRIN) 600 MG tablet   Oral   Take 1 tablet by mouth 3 (three) times daily.         Marland Kitchen lisinopril (PRINIVIL,ZESTRIL) 20 MG tablet   Oral   Take 20 mg by mouth daily.         . metFORMIN (GLUCOPHAGE) 1000 MG tablet   Oral   Take 1,000 mg by mouth 2 (two) times daily.         Marland Kitchen omeprazole (PRILOSEC) 40 MG capsule   Oral   Take 40 mg by mouth 2 (two) times daily.         Marland Kitchen oxyCODONE-acetaminophen (PERCOCET) 5-325 MG tablet   Oral  Take 1 tablet by mouth every 8 (eight) hours as needed.   12 tablet   0   . oxyCODONE-acetaminophen (ROXICET) 5-325 MG tablet   Oral   Take 1 tablet by mouth every 4 (four) hours as needed for severe pain. Patient not taking: Reported on 04/15/2015   12 tablet   0   . polyethylene glycol powder (GLYCOLAX/MIRALAX) powder   Oral   Take 17 g by mouth daily as needed.         . promethazine (PHENERGAN) 25 MG tablet   Oral   Take 1 tablet (25 mg total) by mouth every 6 (six) hours as needed for nausea. Patient not taking: Reported on 04/11/2015   30 tablet   0   . sucralfate (CARAFATE) 1 G tablet   Oral   Take 1 tablet by mouth 4 (four) times daily.         . tamsulosin (FLOMAX) 0.4 MG CAPS capsule   Oral   Take 1 capsule (0.4 mg total) by mouth daily.   30 capsule   11   . tamsulosin (FLOMAX) 0.4 MG  CAPS capsule   Oral   Take 1 capsule (0.4 mg total) by mouth daily. Patient not taking: Reported on 04/15/2015   30 capsule   3   . testosterone cypionate (DEPOTESTOSTERONE CYPIONATE) 200 MG/ML injection   Intramuscular   Inject 200 mg into the muscle every 14 (fourteen) days.           Allergies Review of patient's allergies indicates no known allergies.  Family History  Problem Relation Age of Onset  . Bladder Cancer Neg Hx   . Prostate cancer Neg Hx   . Kidney cancer Neg Hx     Social History Social History  Substance Use Topics  . Smoking status: Never Smoker   . Smokeless tobacco: Never Used  . Alcohol Use: No    Review of Systems  Constitutional: Negative for dizziness Eyes: Negative for visual changes. ENT: Negative for neck pain   Musculoskeletal: Negative for back pain. Skin: Negative for abrasion Neurological: Negative for headaches or focal weakness   ____________________________________________   PHYSICAL EXAM:  VITAL SIGNS: ED Triage Vitals  Enc Vitals Group     BP 05/06/15 2302 137/83 mmHg     Pulse Rate 05/06/15 2302 87     Resp 05/06/15 2302 18     Temp 05/06/15 2302 98.4 F (36.9 C)     Temp Source 05/06/15 2302 Oral     SpO2 05/06/15 2302 94 %     Weight 05/06/15 2317 185 lb (83.915 kg)     Height 05/06/15 2317 5\' 7"  (1.702 m)     Head Cir --      Peak Flow --      Pain Score 05/06/15 2317 10     Pain Loc --      Pain Edu? --      Excl. in [place]? --      Constitutional: Alert and oriented. Well appearing and in no distress. Eyes: Conjunctivae are normal.  ENT   Head: Normocephalic and atraumatic.   Mouth/Throat: Mucous membranes are moist. Cardiovascular: Normal rate, regular rhythm.  Respiratory: Normal respiratory effort without tachypnea nor retractions.  Gastrointestinal: Soft and non-tender in all quadrants. No distention. There is no CVA tenderness. Musculoskeletal: Patient with very painful active and passive  range of motion of the left arm., 2+ distal pulses, tenderness to palpation over the lateral shoulder but no bony abnormalities  felt Neurologic:  Normal speech and language. No gross focal neurologic deficits are appreciated. Skin:  Skin is warm, dry and intact. No rash noted. Psychiatric: Mood and affect are normal. Patient exhibits appropriate insight and judgment.  ____________________________________________    LABS (pertinent positives/negatives)  Labs Reviewed - No data to display  ____________________________________________     ____________________________________________    RADIOLOGY I have personally reviewed any xrays that were ordered on this patient: X-ray shows no fractures  ____________________________________________   PROCEDURES  Procedure(s) performed: none   ____________________________________________   INITIAL IMPRESSION / ASSESSMENT AND PLAN / ED COURSE  Pertinent labs & imaging results that were available during my care of the patient were reviewed by me and considered in my medical decision making (see chart for details).  We'll give patient a shot of IM morphine and Zofran by mouth and sent him for an x-ray  X-rays negative for fracture, exam is highly suspicious for rotator cuff tear. We'll put him in the arm sling and have him follow-up with his orthopedist  ____________________________________________   FINAL CLINICAL IMPRESSION(S) / ED DIAGNOSES  Final diagnoses:  Shoulder sprain, left, initial encounter     Lavonia Drafts, MD 05/06/15 2357

## 2015-05-07 NOTE — ED Notes (Signed)
Patient with no complaints at this time. Respirations even and unlabored. Skin warm/dry. Discharge instructions reviewed with patient at this time. Patient given opportunity to voice concerns/ask questions. Patient discharged at this time and left Emergency Department with steady gait.   

## 2015-05-10 DIAGNOSIS — M75122 Complete rotator cuff tear or rupture of left shoulder, not specified as traumatic: Secondary | ICD-10-CM | POA: Insufficient documentation

## 2015-06-07 ENCOUNTER — Ambulatory Visit (INDEPENDENT_AMBULATORY_CARE_PROVIDER_SITE_OTHER): Payer: Medicare Other | Admitting: Family Medicine

## 2015-06-07 ENCOUNTER — Encounter: Payer: Self-pay | Admitting: Family Medicine

## 2015-06-07 VITALS — BP 138/72 | HR 95 | Temp 98.6°F | Resp 15 | Ht 67.0 in | Wt 190.7 lb

## 2015-06-07 DIAGNOSIS — M79642 Pain in left hand: Secondary | ICD-10-CM

## 2015-06-07 DIAGNOSIS — M79641 Pain in right hand: Secondary | ICD-10-CM

## 2015-06-07 MED ORDER — MELOXICAM 15 MG PO TABS
15.0000 mg | ORAL_TABLET | Freq: Every day | ORAL | Status: DC
Start: 2015-06-07 — End: 2015-07-15

## 2015-06-07 NOTE — Progress Notes (Signed)
Name: Howard Murphy   MRN: [id number]    DOB: 1955/08/10   Date:06/07/2015       Progress Note  Subjective  Chief Complaint  Chief Complaint  Patient presents with  . Establish Care    NP  . Diabetes  . Shortness of Breath  . Hyperlipidemia    Arthritis Presents for initial visit. He complains of pain and stiffness (pain in lower back on both sides along with stiffness). Affected locations include the left foot, right foot and left shoulder (Both hands and both lower legs down to his feet). His pain is at a severity of 9/10. Past treatments include acetaminophen and NSAIDs.    Past Medical History  Diagnosis Date  . Diabetes mellitus without complication ([place])   . Hypertension   . GERD (gastroesophageal reflux disease)   . Hyperlipemia   . Nephrolithiasis   . Shortness of breath dyspnea   . 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    Past Surgical History  Procedure Laterality Date  . Cholecystectomy  2000  . Back surgery  2015  . Shoulder surgery Right 2016  . Colonoscopy with propofol N/A 04/11/2015    Procedure: COLONOSCOPY WITH PROPOFOL;  Surgeon: Manya Silvas, MD;  Location: [facility] ENDOSCOPY;  Service: Endoscopy;  Laterality: N/A;  . Esophagogastroduodenoscopy (egd) with propofol N/A 04/11/2015    Procedure: ESOPHAGOGASTRODUODENOSCOPY (EGD) WITH PROPOFOL;  Surgeon: Manya Silvas, MD;  Location: [facility] ENDOSCOPY;  Service: Endoscopy;  Laterality: N/A;    Family History  Problem Relation Age of Onset  . Bladder Cancer Neg Hx   . Prostate cancer Neg Hx   . Kidney cancer Neg Hx   . Cancer Mother     Breast CA  . Stroke Father   . Heart disease Father   . Cancer Sister   . Cancer Maternal Grandmother   . Cancer Sister     Social History   Social History  . Marital Status: Single    Spouse Name: N/A  . Number of Children: N/A  . Years of Education: N/A   Occupational History  . Not on file.   Social History Main Topics  . Smoking status: Never Smoker    . Smokeless tobacco: Never Used  . Alcohol Use: No     Comment: occasional  . Drug Use: No  . Sexual Activity: Yes   Other Topics Concern  . Not on file   Social History Narrative     Current outpatient prescriptions:  .  atorvastatin (LIPITOR) 40 MG tablet, Take 40 mg by mouth daily., Disp: , Rfl:  .  chlorthalidone (HYGROTON) 50 MG tablet, Take 50 mg by mouth daily., Disp: , Rfl:  .  ibuprofen (ADVIL,MOTRIN) 600 MG tablet, Take 1 tablet by mouth daily as needed. , Disp: , Rfl:  .  lisinopril (PRINIVIL,ZESTRIL) 20 MG tablet, Take 20 mg by mouth daily., Disp: , Rfl:  .  omeprazole (PRILOSEC) 40 MG capsule, Take 40 mg by mouth 2 (two) times daily., Disp: , Rfl:  .  ondansetron (ZOFRAN-ODT) 4 MG disintegrating tablet, Take 4 mg by mouth every 8 (eight) hours as needed for nausea or vomiting., Disp: , Rfl:  .  oxyCODONE-acetaminophen (ROXICET) 5-325 MG tablet, Take 1 tablet by mouth every 6 (six) hours as needed., Disp: 20 tablet, Rfl: 0 .  polyethylene glycol powder (GLYCOLAX/MIRALAX) powder, Take 17 g by mouth daily as needed., Disp: , Rfl:  .  sucralfate (CARAFATE) 1 G tablet, Take 1  tablet by mouth 4 (four) times daily., Disp: , Rfl:  .  tamsulosin (FLOMAX) 0.4 MG CAPS capsule, Take 1 capsule (0.4 mg total) by mouth daily., Disp: 30 capsule, Rfl: 11 .  testosterone cypionate (DEPOTESTOSTERONE CYPIONATE) 200 MG/ML injection, Inject 200 mg into the muscle every 14 (fourteen) days., Disp: , Rfl:   No Known Allergies   Review of Systems  Musculoskeletal: Positive for arthritis and stiffness (pain in lower back on both sides along with stiffness).    Objective  Filed Vitals:   06/07/15 1333  BP: 138/72  Pulse: 95  Temp: 98.6 F (37 C)  TempSrc: Oral  Resp: 15  Height: 5\' 7"  (1.702 m)  Weight: 190 lb 11.2 oz (86.501 kg)  SpO2: 96%    Physical Exam  Constitutional: He is well-developed, well-nourished, and in no distress.  Cardiovascular: Normal rate, regular rhythm and  normal heart sounds.   Pulmonary/Chest: Effort normal and breath sounds normal.  Musculoskeletal:       Left hand: He exhibits decreased range of motion and tenderness. He exhibits no bony tenderness and no swelling.       Hands: Nursing note and vitals reviewed.   Assessment & Plan  1. Bilateral hand pain We'll obtain x-rays for evaluation of arthritis. DC ibuprofen and start on meloxicam 15 mg daily. - DG Hand Complete Right; Future - DG Shoulder Left; Future - meloxicam (MOBIC) 15 MG tablet; Take 1 tablet (15 mg total) by mouth daily.  Dispense: 30 tablet; Refill: 0 - DG Hand Complete Left; Future    Maaliyah Adolph [facility] 06/07/2015 2:01 PM

## 2015-06-12 HISTORY — PX: SHOULDER SURGERY: SHX246

## 2015-06-14 ENCOUNTER — Ambulatory Visit: Payer: Medicare Other | Admitting: Obstetrics and Gynecology

## 2015-06-14 ENCOUNTER — Ambulatory Visit (INDEPENDENT_AMBULATORY_CARE_PROVIDER_SITE_OTHER): Payer: Medicare Other | Admitting: Obstetrics and Gynecology

## 2015-06-14 ENCOUNTER — Encounter: Payer: Self-pay | Admitting: Obstetrics and Gynecology

## 2015-06-14 VITALS — BP 128/86 | HR 83 | Resp 16 | Ht 67.0 in | Wt 194.7 lb

## 2015-06-14 DIAGNOSIS — N400 Enlarged prostate without lower urinary tract symptoms: Secondary | ICD-10-CM

## 2015-06-14 DIAGNOSIS — Q6100 Congenital renal cyst, unspecified: Secondary | ICD-10-CM | POA: Diagnosis not present

## 2015-06-14 DIAGNOSIS — R7989 Other specified abnormal findings of blood chemistry: Secondary | ICD-10-CM

## 2015-06-14 DIAGNOSIS — R748 Abnormal levels of other serum enzymes: Secondary | ICD-10-CM

## 2015-06-14 DIAGNOSIS — N281 Cyst of kidney, acquired: Secondary | ICD-10-CM

## 2015-06-14 LAB — MICROSCOPIC EXAMINATION
EPITHELIAL CELLS (NON RENAL): NONE SEEN /HPF (ref 0–10)
WBC, UA: NONE SEEN /hpf (ref 0–?)

## 2015-06-14 LAB — URINALYSIS, COMPLETE
Bilirubin, UA: NEGATIVE
Glucose, UA: NEGATIVE
Ketones, UA: NEGATIVE
LEUKOCYTES UA: NEGATIVE
Nitrite, UA: NEGATIVE
PH UA: 5.5 (ref 5.0–7.5)
RBC, UA: NEGATIVE
Specific Gravity, UA: >1.03 — ABNORMAL HIGH (ref 1.005–1.030)
Urobilinogen, Ur: 0.2 mg/dL (ref 0.2–1.0)

## 2015-06-14 LAB — BLADDER SCAN AMB NON-IMAGING: Scan Result: 26

## 2015-06-14 MED ORDER — SILODOSIN 8 MG PO CAPS: 8.0000 mg | ORAL_CAPSULE | Freq: Every day | ORAL | Status: DC

## 2015-06-14 MED ORDER — FINASTERIDE 5 MG PO TABS: 5.0000 mg | ORAL_TABLET | Freq: Every day | ORAL | Status: DC

## 2015-06-14 NOTE — Progress Notes (Signed)
11:38 AM   [name]  [birth year] [id number]  Referring provider: No referring provider defined for this encounter.  No chief complaint on file.   HPI: Howard Murphy is a patient assessed to this office in the past with BPH. He gets up 4-5 times a night or less. He voids every 2 archer in the daytime. His flow was slow and sometimes trickling. He is to be on Flomax and he thinks this helped but currently is not on it for approximately 2 years. He does not give blood in the urine or urinary tract infections  Modifying factors: There are no other modifying factors  Associated signs and symptoms: There are no other associated signs and symptoms Aggravating and relieving factors: There are no other aggravating or relieving factors Severity: Moderate Duration: Persistent    Current Status: Patient reports no improvement in urinary symptoms while taking Flomax. His current IP SS score is 30 with quality of life unhappy today. He reports that he has taken Rapaflo in the past with good results. He also presents today to discuss his renal ultrasound findings for further evaluation of a renal cyst seen on CT scan. Renal ultrasound demonstrating a minimally complex 16 mm right midpole cyst with either partially calcified wall versus adjacent nonobstructing calculus.   PMH: Past Medical History  Diagnosis Date  . Diabetes mellitus without complication ([place])   . Hypertension   . GERD (gastroesophageal reflux disease)   . Hyperlipemia   . Nephrolithiasis   . Shortness of breath dyspnea   . 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    Surgical History: Past Surgical History  Procedure Laterality Date  . Cholecystectomy  2000  . Back surgery  2015  . Shoulder surgery Right 2016  . Colonoscopy with propofol N/A 04/11/2015    Procedure: COLONOSCOPY WITH PROPOFOL;  Surgeon: Manya Silvas, MD;  Location: [name] ENDOSCOPY;  Service: Endoscopy;  Laterality: N/A;  . Esophagogastroduodenoscopy  (egd) with propofol N/A 04/11/2015    Procedure: ESOPHAGOGASTRODUODENOSCOPY (EGD) WITH PROPOFOL;  Surgeon: Manya Silvas, MD;  Location: [facility] ENDOSCOPY;  Service: Endoscopy;  Laterality: N/A;    Home Medications:    Medication List       This list is accurate as of: 06/14/15 11:38 AM.  Always use your most recent med list.               atorvastatin 40 MG tablet  Commonly known as:  LIPITOR  Take 40 mg by mouth daily.     chlorthalidone 50 MG tablet  Commonly known as:  HYGROTON  Take 50 mg by mouth daily.     finasteride 5 MG tablet  Commonly known as:  PROSCAR  Take 1 tablet (5 mg total) by mouth daily.     lisinopril 20 MG tablet  Commonly known as:  PRINIVIL,ZESTRIL  Take 20 mg by mouth daily.     meloxicam 15 MG tablet  Commonly known as:  MOBIC  Take 1 tablet (15 mg total) by mouth daily.     omeprazole 40 MG capsule  Commonly known as:  PRILOSEC  Take 40 mg by mouth 2 (two) times daily.     ondansetron 4 MG disintegrating tablet  Commonly known as:  ZOFRAN-ODT  Take 4 mg by mouth every 8 (eight) hours as needed for nausea or vomiting.     oxyCODONE-acetaminophen 5-325 MG tablet  Commonly known as:  ROXICET  Take 1 tablet by mouth every 6 (six)  hours as needed.     polyethylene glycol powder powder  Commonly known as:  GLYCOLAX/MIRALAX  Take 17 g by mouth daily as needed.     silodosin 8 MG Caps capsule  Commonly known as:  RAPAFLO  Take 1 capsule (8 mg total) by mouth daily with breakfast.     sucralfate 1 g tablet  Commonly known as:  CARAFATE  Take 1 tablet by mouth 4 (four) times daily.     tamsulosin 0.4 MG Caps capsule  Commonly known as:  FLOMAX  Take 1 capsule (0.4 mg total) by mouth daily.     testosterone cypionate 200 MG/ML injection  Commonly known as:  DEPOTESTOSTERONE CYPIONATE  Inject 200 mg into the muscle every 14 (fourteen) days.        Allergies: No Known Allergies  Family History: Family History  Problem Relation Age  of Onset  . Bladder Cancer Neg Hx   . Prostate cancer Neg Hx   . Kidney cancer Neg Hx   . Cancer Mother     Breast CA  . Stroke Father   . Heart disease Father   . Cancer Sister   . Cancer Maternal Grandmother   . Cancer Sister     Social History:  reports that he has never smoked. He has never used smokeless tobacco. He reports that he does not drink alcohol or use illicit drugs.  ROS: UROLOGY Frequent Urination?: No Hard to postpone urination?: No Burning/pain with urination?: No Get up at night to urinate?: Yes Leakage of urine?: No Urine stream starts and stops?: Yes Trouble starting stream?: Yes Do you have to strain to urinate?: Yes Blood in urine?: No Urinary tract infection?: No Sexually transmitted disease?: No Injury to kidneys or bladder?: No Painful intercourse?: No Weak stream?: Yes Erection problems?: No Penile pain?: No  Gastrointestinal Nausea?: No Vomiting?: No Indigestion/heartburn?: Yes Diarrhea?: No Constipation?: No  Constitutional Fever: No Night sweats?: No Weight loss?: Yes Fatigue?: Yes  Skin Skin rash/lesions?: No Itching?: No  Eyes Blurred vision?: Yes Double vision?: No  Ears/Nose/Throat Sore throat?: No Sinus problems?: No  Hematologic/Lymphatic Swollen glands?: No Easy bruising?: No  Cardiovascular Leg swelling?: No Chest pain?: No  Respiratory Cough?: No Shortness of breath?: Yes  Endocrine Excessive thirst?: No  Musculoskeletal Back pain?: No Joint pain?: Yes  Neurological Headaches?: Yes Dizziness?: Yes  Psychologic Depression?: No Anxiety?: No  Physical Exam: BP 128/86 mmHg  Pulse 83  Resp 16  Ht 5\' 7"  (1.702 m)  Wt 194 lb 11.2 oz (88.315 kg)  BMI 30.49 kg/m2  Constitutional:  Alert and oriented, No acute distress. HEENT: [place] AT, moist mucus membranes.  Trachea midline, no masses. Cardiovascular: No clubbing, cyanosis, or edema. Respiratory: Normal respiratory effort, no increased work of  breathing. Skin: No rashes, bruises or suspicious lesions. Lymph: No cervical or inguinal adenopathy. Neurologic: Grossly intact, no focal deficits, moving all 4 extremities. Psychiatric: Normal mood and affect.  Laboratory Data: Lab Results  Component Value Date   WBC 11.8* 04/13/2015   HGB 17.1 04/13/2015   HCT 51.9 04/13/2015   MCV 87.2 04/13/2015   PLT 215 04/13/2015    Lab Results  Component Value Date   CREATININE 1.31* 04/15/2015    No results found for: PSA  No results found for: TESTOSTERONE  No results found for: HGBA1C  Urinalysis Results for orders placed or performed in visit on 06/14/15  BLADDER SCAN AMB NON-IMAGING  Result Value Ref Range   Scan Result 26 mL  Pertinent Imaging: None  Assessment & Plan:  BPH with nighttime frequency and slow flow. Renal cyst seen on CT.  F/u renal US.  1. BPH (benign prostatic hyperplasia) Patient advised to stop Flomax and provided one month's worth of samples for Rapaflo. I will also send a prescription to his pharmacy. We will start finasteride at this time as well. PSA checked today. F/u 3 months. Uroflow/PVR/I-PSS - Urinalysis, Complete - BLADDER SCAN AMB NON-IMAGING  2. Renal Cyst- Renal ultrasound demonstrating a minimally complex 16 mm right midpole cyst with either partially calcified wall versus adjacent nonobstructing calculus. Minimally complex f/u 1 year with CT abdomen w/ and w/out contrast.   3. Elevated Serum Creatinine- Recheck today.   Patient was provided cup prior to appointment and Uroflow was not able to be obtained.  Herbert Moors, [facility] [address], [place], [place] [address] [phone]

## 2015-06-15 LAB — BASIC METABOLIC PANEL
BUN/Creatinine Ratio: 15 (ref 9–20)
BUN: 15 mg/dL (ref 6–24)
CALCIUM: 9.6 mg/dL (ref 8.7–10.2)
CO2: 28 mmol/L (ref 18–29)
CREATININE: 1 mg/dL (ref 0.76–1.27)
Chloride: 98 mmol/L (ref 96–106)
GFR calc Af Amer: 95 mL/min/{1.73_m2} (ref >59)
GFR, EST NON AFRICAN AMERICAN: 82 mL/min/{1.73_m2} (ref >59)
GLUCOSE: 147 mg/dL — AB (ref 65–99)
Potassium: 4.2 mmol/L (ref 3.5–5.2)
Sodium: 142 mmol/L (ref 134–144)

## 2015-06-15 LAB — PSA TOTAL (REFLEX TO FREE): PROSTATE SPECIFIC AG, SERUM: 2.9 ng/mL (ref 0.0–4.0)

## 2015-06-16 ENCOUNTER — Telehealth: Payer: Self-pay

## 2015-06-16 NOTE — Telephone Encounter (Signed)
-----   Message from Roda Shutters, [place] sent at 06/15/2015  1:28 PM EST ----- Notify patient that his creatinine levels are now back within normal range. It appears his kidney function is back to normal.  Thanks

## 2015-06-16 NOTE — Telephone Encounter (Signed)
Spoke with pt in reference to PSA. Pt voiced understanding.  

## 2015-06-16 NOTE — Telephone Encounter (Signed)
-----   Message from Roda Shutters, [place] sent at 06/16/2015 12:57 PM EST ----- Please notify patient that his PSA was within normal range at 2.9. He needs to follow up as scheduled. Thanks

## 2015-06-16 NOTE — Telephone Encounter (Signed)
Spoke with pt in reference to lab results. Pt voiced understanding.  

## 2015-06-20 DIAGNOSIS — Z8249 Family history of ischemic heart disease and other diseases of the circulatory system: Secondary | ICD-10-CM | POA: Diagnosis not present

## 2015-06-20 DIAGNOSIS — Z79899 Other long term (current) drug therapy: Secondary | ICD-10-CM | POA: Diagnosis not present

## 2015-06-20 DIAGNOSIS — S46812A Strain of other muscles, fascia and tendons at shoulder and upper arm level, left arm, initial encounter: Secondary | ICD-10-CM | POA: Diagnosis not present

## 2015-06-20 DIAGNOSIS — R609 Edema, unspecified: Secondary | ICD-10-CM | POA: Diagnosis not present

## 2015-06-20 DIAGNOSIS — K219 Gastro-esophageal reflux disease without esophagitis: Secondary | ICD-10-CM | POA: Diagnosis not present

## 2015-06-20 DIAGNOSIS — J90 Pleural effusion, not elsewhere classified: Secondary | ICD-10-CM | POA: Diagnosis not present

## 2015-06-20 DIAGNOSIS — E119 Type 2 diabetes mellitus without complications: Secondary | ICD-10-CM | POA: Diagnosis not present

## 2015-06-20 DIAGNOSIS — J9811 Atelectasis: Secondary | ICD-10-CM | POA: Diagnosis not present

## 2015-06-20 DIAGNOSIS — M75102 Unspecified rotator cuff tear or rupture of left shoulder, not specified as traumatic: Secondary | ICD-10-CM | POA: Diagnosis not present

## 2015-06-20 DIAGNOSIS — S46219A Strain of muscle, fascia and tendon of other parts of biceps, unspecified arm, initial encounter: Secondary | ICD-10-CM | POA: Diagnosis not present

## 2015-06-20 DIAGNOSIS — I10 Essential (primary) hypertension: Secondary | ICD-10-CM | POA: Diagnosis not present

## 2015-06-20 DIAGNOSIS — S46112A Strain of muscle, fascia and tendon of long head of biceps, left arm, initial encounter: Secondary | ICD-10-CM | POA: Diagnosis not present

## 2015-06-20 DIAGNOSIS — R0789 Other chest pain: Secondary | ICD-10-CM | POA: Diagnosis not present

## 2015-06-20 DIAGNOSIS — R0682 Tachypnea, not elsewhere classified: Secondary | ICD-10-CM | POA: Diagnosis not present

## 2015-06-20 DIAGNOSIS — R0902 Hypoxemia: Secondary | ICD-10-CM | POA: Diagnosis not present

## 2015-06-20 DIAGNOSIS — R069 Unspecified abnormalities of breathing: Secondary | ICD-10-CM | POA: Diagnosis not present

## 2015-06-20 DIAGNOSIS — G8918 Other acute postprocedural pain: Secondary | ICD-10-CM | POA: Diagnosis not present

## 2015-06-20 DIAGNOSIS — R0989 Other specified symptoms and signs involving the circulatory and respiratory systems: Secondary | ICD-10-CM | POA: Diagnosis not present

## 2015-06-20 DIAGNOSIS — R0602 Shortness of breath: Secondary | ICD-10-CM | POA: Diagnosis not present

## 2015-06-20 DIAGNOSIS — R079 Chest pain, unspecified: Secondary | ICD-10-CM | POA: Diagnosis not present

## 2015-06-20 DIAGNOSIS — M75122 Complete rotator cuff tear or rupture of left shoulder, not specified as traumatic: Secondary | ICD-10-CM | POA: Diagnosis not present

## 2015-07-14 ENCOUNTER — Ambulatory Visit (INDEPENDENT_AMBULATORY_CARE_PROVIDER_SITE_OTHER): Payer: Medicare Other | Admitting: Family Medicine

## 2015-07-14 ENCOUNTER — Encounter: Payer: Self-pay | Admitting: Family Medicine

## 2015-07-14 VITALS — BP 130/75 | HR 94 | Temp 98.4°F | Resp 20 | Ht 67.0 in | Wt 191.6 lb

## 2015-07-14 DIAGNOSIS — R1010 Upper abdominal pain, unspecified: Secondary | ICD-10-CM | POA: Diagnosis not present

## 2015-07-14 DIAGNOSIS — R0600 Dyspnea, unspecified: Secondary | ICD-10-CM

## 2015-07-14 DIAGNOSIS — E7800 Pure hypercholesterolemia, unspecified: Secondary | ICD-10-CM | POA: Diagnosis not present

## 2015-07-14 DIAGNOSIS — G8929 Other chronic pain: Secondary | ICD-10-CM

## 2015-07-14 DIAGNOSIS — E119 Type 2 diabetes mellitus without complications: Secondary | ICD-10-CM | POA: Insufficient documentation

## 2015-07-14 DIAGNOSIS — R079 Chest pain, unspecified: Secondary | ICD-10-CM | POA: Diagnosis not present

## 2015-07-14 DIAGNOSIS — R1011 Right upper quadrant pain: Secondary | ICD-10-CM

## 2015-07-14 DIAGNOSIS — E1142 Type 2 diabetes mellitus with diabetic polyneuropathy: Secondary | ICD-10-CM

## 2015-07-14 LAB — POCT GLYCOSYLATED HEMOGLOBIN (HGB A1C): Hemoglobin A1C: 7.1

## 2015-07-14 MED ORDER — SITAGLIPTIN PHOSPHATE 50 MG PO TABS: 50.0000 mg | ORAL_TABLET | Freq: Every day | ORAL | Status: DC

## 2015-07-14 NOTE — Progress Notes (Signed)
Name: Howard Murphy   MRN: [id number]    DOB: 1956-02-29   Date:07/14/2015       Progress Note  Subjective  Chief Complaint  Chief Complaint  Patient presents with  . Follow-up    1 mo  . Shortness of Breath  . Diabetes  . Hyperlipidemia    Diabetes He presents for his follow-up diabetic visit. He has type 2 diabetes mellitus. Hypoglycemia symptoms include headaches. Associated symptoms include chest pain (intermittent, but especially when he gets out of breath,), fatigue, foot paresthesias, polydipsia and polyuria. Symptoms are stable. When asked about current treatments, none (Initially on Metformin, which was discontinued due to side effects.) were reported. He monitors blood glucose at home 1-2 x per week. His breakfast blood glucose range is generally 130-140 mg/dl.  Hyperlipidemia This is a chronic problem. Associated symptoms include chest pain (intermittent, but especially when he gets out of breath,) and leg pain. Pertinent negatives include no myalgias or shortness of breath. Risk factors for coronary artery disease include diabetes mellitus.  Shortness of Breath This is a chronic problem. Episode onset: over 6 months ago. Associated symptoms include chest pain (intermittent, but especially when he gets out of breath,), a fever, headaches and leg pain. Pertinent negatives include no sore throat. He has tried nothing for the symptoms.    Past Medical History  Diagnosis Date  . Diabetes mellitus without complication ([place])   . Hypertension   . GERD (gastroesophageal reflux disease)   . Hyperlipemia   . Nephrolithiasis   . Shortness of breath dyspnea   . 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    Past Surgical History  Procedure Laterality Date  . Cholecystectomy  2000  . Back surgery  2015  . Shoulder surgery Right 2016  . Colonoscopy with propofol N/A 04/11/2015    Procedure: COLONOSCOPY WITH PROPOFOL;  Surgeon: Manya Silvas, MD;  Location: [facility] ENDOSCOPY;  Service:  Endoscopy;  Laterality: N/A;  . Esophagogastroduodenoscopy (egd) with propofol N/A 04/11/2015    Procedure: ESOPHAGOGASTRODUODENOSCOPY (EGD) WITH PROPOFOL;  Surgeon: Manya Silvas, MD;  Location: [facility] ENDOSCOPY;  Service: Endoscopy;  Laterality: N/A;    Family History  Problem Relation Age of Onset  . Bladder Cancer Neg Hx   . Prostate cancer Neg Hx   . Kidney cancer Neg Hx   . Cancer Mother     Breast CA  . Stroke Father   . Heart disease Father   . Cancer Sister   . Cancer Maternal Grandmother   . Cancer Sister     Social History   Social History  . Marital Status: Single    Spouse Name: N/A  . Number of Children: N/A  . Years of Education: N/A   Occupational History  . Not on file.   Social History Main Topics  . Smoking status: Never Smoker   . Smokeless tobacco: Never Used  . Alcohol Use: No     Comment: occasional  . Drug Use: No  . Sexual Activity: Yes   Other Topics Concern  . Not on file   Social History Narrative     Current outpatient prescriptions:  .  atorvastatin (LIPITOR) 40 MG tablet, Take 40 mg by mouth daily., Disp: , Rfl:  .  chlorthalidone (HYGROTON) 50 MG tablet, Take 50 mg by mouth daily., Disp: , Rfl:  .  finasteride (PROSCAR) 5 MG tablet, Take 1 tablet (5 mg total) by mouth daily., Disp: 30 tablet, Rfl: 6 .  lisinopril (PRINIVIL,ZESTRIL) 20 MG tablet, Take 20 mg by mouth daily., Disp: , Rfl:  .  meloxicam (MOBIC) 15 MG tablet, Take 1 tablet (15 mg total) by mouth daily., Disp: 30 tablet, Rfl: 0 .  omeprazole (PRILOSEC) 40 MG capsule, Take 40 mg by mouth 2 (two) times daily., Disp: , Rfl:  .  ondansetron (ZOFRAN-ODT) 4 MG disintegrating tablet, Take 4 mg by mouth every 8 (eight) hours as needed for nausea or vomiting., Disp: , Rfl:  .  oxyCODONE-acetaminophen (ROXICET) 5-325 MG tablet, Take 1 tablet by mouth every 6 (six) hours as needed., Disp: 20 tablet, Rfl: 0 .  silodosin (RAPAFLO) 8 MG CAPS capsule, Take 1 capsule (8 mg total) by  mouth daily with breakfast., Disp: 30 capsule, Rfl: 3 .  sucralfate (CARAFATE) 1 G tablet, Take 1 tablet by mouth 4 (four) times daily., Disp: , Rfl:  .  tamsulosin (FLOMAX) 0.4 MG CAPS capsule, Take 1 capsule (0.4 mg total) by mouth daily., Disp: 30 capsule, Rfl: 11 .  testosterone cypionate (DEPOTESTOSTERONE CYPIONATE) 200 MG/ML injection, Inject 200 mg into the muscle every 14 (fourteen) days., Disp: , Rfl:   No Known Allergies   Review of Systems  Constitutional: Positive for fever and fatigue.  HENT: Negative for sore throat.   Respiratory: Negative for shortness of breath.   Cardiovascular: Positive for chest pain (intermittent, but especially when he gets out of breath,).  Musculoskeletal: Negative for myalgias.  Neurological: Positive for headaches.  Endo/Heme/Allergies: Positive for polydipsia.     Objective  Filed Vitals:   07/14/15 1118  BP: 130/75  Pulse: 94  Temp: 98.4 F (36.9 C)  TempSrc: Oral  Resp: 20  Height: 5\' 7"  (1.702 m)  Weight: 191 lb 9.6 oz (86.909 kg)  SpO2: 93%    Physical Exam  Constitutional: He is well-developed, well-nourished, and in no distress.  Neck: Neck supple.  Cardiovascular: Normal rate, regular rhythm and normal heart sounds.   No murmur heard. Pulmonary/Chest: Effort normal and breath sounds normal. He has no wheezes. He has no rales.  Abdominal: Soft. Bowel sounds are normal. There is no hepatosplenomegaly. There is tenderness in the right upper quadrant and epigastric area.  Musculoskeletal:       Right ankle: He exhibits no swelling.       Left ankle: He exhibits no swelling.  Nursing note and vitals reviewed.     Assessment & Plan  1. Dyspnea Laboratory and imaging evaluation of worsening dyspnea over the last 6 months - CBC with Differential - Comprehensive Metabolic Panel (CMET) - DG Chest 2 View; Future - EKG 12-Lead  2. Abdominal pain, chronic, right upper quadrant  - US Abdomen Complete; Future  3. Type 2  diabetes mellitus with peripheral neuropathy (HCC) Patient has stopped metformin because of adverse effects.A1c at goal today. We'll start on Januvia 50 mg daily - sitaGLIPtin (JANUVIA) 50 MG tablet; Take 1 tablet (50 mg total) by mouth daily.  Dispense: 30 tablet; Refill: 2 - POCT HgB A1C  4. Hypercholesteremia  - Lipid Profile  5. Chest pain, unspecified chest pain type  - Ambulatory referral to Cardiology    [facility]. [facility] 07/14/2015 12:02 PM

## 2015-07-15 ENCOUNTER — Ambulatory Visit
Admission: RE | Admit: 2015-07-15 | Discharge: 2015-07-15 | Disposition: A | Discharge status: Home / Self Care | Payer: Medicare Other | Source: Ambulatory Visit | Attending: Family Medicine | Admitting: Family Medicine

## 2015-07-15 ENCOUNTER — Emergency Department
Admission: EM | Admit: 2015-07-15 | Discharge: 2015-07-15 | Disposition: A | Discharge status: Home / Self Care | Payer: Medicare Other | Attending: Emergency Medicine | Admitting: Emergency Medicine

## 2015-07-15 ENCOUNTER — Encounter: Payer: Self-pay | Admitting: Emergency Medicine

## 2015-07-15 DIAGNOSIS — Y9289 Other specified places as the place of occurrence of the external cause: Secondary | ICD-10-CM | POA: Diagnosis not present

## 2015-07-15 DIAGNOSIS — I10 Essential (primary) hypertension: Secondary | ICD-10-CM | POA: Insufficient documentation

## 2015-07-15 DIAGNOSIS — R0600 Dyspnea, unspecified: Secondary | ICD-10-CM

## 2015-07-15 DIAGNOSIS — Z79899 Other long term (current) drug therapy: Secondary | ICD-10-CM | POA: Diagnosis not present

## 2015-07-15 DIAGNOSIS — R918 Other nonspecific abnormal finding of lung field: Secondary | ICD-10-CM | POA: Diagnosis not present

## 2015-07-15 DIAGNOSIS — S4992XA Unspecified injury of left shoulder and upper arm, initial encounter: Secondary | ICD-10-CM

## 2015-07-15 DIAGNOSIS — S46092A Other injury of muscle(s) and tendon(s) of the rotator cuff of left shoulder, initial encounter: Secondary | ICD-10-CM | POA: Diagnosis not present

## 2015-07-15 DIAGNOSIS — Y9389 Activity, other specified: Secondary | ICD-10-CM | POA: Diagnosis not present

## 2015-07-15 DIAGNOSIS — M75102 Unspecified rotator cuff tear or rupture of left shoulder, not specified as traumatic: Secondary | ICD-10-CM | POA: Insufficient documentation

## 2015-07-15 DIAGNOSIS — Y998 Other external cause status: Secondary | ICD-10-CM | POA: Insufficient documentation

## 2015-07-15 DIAGNOSIS — M67912 Unspecified disorder of synovium and tendon, left shoulder: Secondary | ICD-10-CM

## 2015-07-15 DIAGNOSIS — E1142 Type 2 diabetes mellitus with diabetic polyneuropathy: Secondary | ICD-10-CM | POA: Insufficient documentation

## 2015-07-15 DIAGNOSIS — W010XXA Fall on same level from slipping, tripping and stumbling without subsequent striking against object, initial encounter: Secondary | ICD-10-CM | POA: Insufficient documentation

## 2015-07-15 DIAGNOSIS — R0602 Shortness of breath: Secondary | ICD-10-CM | POA: Diagnosis not present

## 2015-07-15 DIAGNOSIS — E7800 Pure hypercholesterolemia, unspecified: Secondary | ICD-10-CM | POA: Diagnosis not present

## 2015-07-15 MED ORDER — MELOXICAM 15 MG PO TABS: 15.0000 mg | ORAL_TABLET | Freq: Every day | ORAL | Status: DC

## 2015-07-15 MED ORDER — HYDROCODONE-ACETAMINOPHEN 5-325 MG PO TABS: 1.0000 | ORAL_TABLET | ORAL | Status: DC | PRN

## 2015-07-15 NOTE — ED Notes (Signed)
Pt had left rotater cuff surgrey 4 weeks ago, fell this afternoon and states he felt something pop, pt guarding left arm

## 2015-07-15 NOTE — ED Notes (Signed)
Pt had left shoulder surgery 4 weeks ago, states he slipped and fell today and is in pain, concerned he may have damaged something from surgery.

## 2015-07-15 NOTE — Discharge Instructions (Signed)

## 2015-07-15 NOTE — ED Provider Notes (Signed)
[facility] Emergency Department Provider Note  ____________________________________________  Time seen: Approximately 3:48 PM  I have reviewed the triage vital signs and the nursing notes.   HISTORY  Chief Complaint Shoulder Pain    HPI Howard Murphy is a 60 y.o. male who presents emergency department complaining with shoulder pain. Patient had a tear to his rotator cuff that was surgically repaired 4 weeks prior. Patient states that he was on the back of a pickup truck moving some things when he lost his balance and his left arm got "caught". Patient states that he had a popping/tearing sensation in his shoulder. He is now endorsing pain to the anterior aspect of his left shoulder. Patient denies any radicular symptoms down his left arm. Patient is guarding left arm with other hand. He states the pain is moderate to severe, sharp, constant.   Past Medical History  Diagnosis Date  . Diabetes mellitus without complication ([place])   . Hypertension   . GERD (gastroesophageal reflux disease)   . Hyperlipemia   . Nephrolithiasis   . Shortness of breath dyspnea   . 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    Patient Active Problem List   Diagnosis Date Noted  . Dyspnea 07/14/2015  . Abdominal pain, chronic, right upper quadrant 07/14/2015  . Type 2 diabetes mellitus with peripheral neuropathy ([place]) 07/14/2015  . Chest pain 07/14/2015  . Bilateral hand pain 06/07/2015  . Breath shortness 03/22/2015  . Diabetes ([place]) 07/18/2013  . Elevated hemoglobin ([place]) 07/07/2013  . Ache in joint 07/07/2013  . Arthritis, degenerative 07/07/2013  . Lumbar canal stenosis 05/29/2013  . Synovial cyst of lumbar spine 05/29/2013  . Back ache 05/05/2013  . 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 Acid reflux 05/05/2013  . HLD (hyperlipidemia) 05/05/2013  . BP (high blood pressure) 05/08/2011  . Hypercholesteremia 05/08/2011  . 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 Rheumatoid arthritis ([place]) 05/08/2011    Past Surgical History  Procedure Laterality Date  . Cholecystectomy  2000  . Back surgery  2015  . Shoulder surgery Right 2016  . Colonoscopy with propofol N/A 04/11/2015    Procedure: COLONOSCOPY WITH PROPOFOL;  Surgeon: Manya Silvas, MD;  Location: [facility] ENDOSCOPY;  Service: Endoscopy;  Laterality: N/A;  . Esophagogastroduodenoscopy (egd) with propofol N/A 04/11/2015    Procedure: ESOPHAGOGASTRODUODENOSCOPY (EGD) WITH PROPOFOL;  Surgeon: Manya Silvas, MD;  Location: [facility] ENDOSCOPY;  Service: Endoscopy;  Laterality: N/A;    Current Outpatient Rx  Name  Route  Sig  Dispense  Refill  . atorvastatin (LIPITOR) 40 MG tablet   Oral   Take 40 mg by mouth daily.         . chlorthalidone (HYGROTON) 50 MG tablet   Oral   Take 50 mg by mouth daily.         . finasteride (PROSCAR) 5 MG tablet   Oral   Take 1 tablet (5 mg total) by mouth daily.   30 tablet   6   . HYDROcodone-acetaminophen (NORCO/VICODIN) 5-325 MG tablet   Oral   Take 1 tablet by mouth every 4 (four) hours as needed for moderate pain.   20 tablet   0   . lisinopril (PRINIVIL,ZESTRIL) 20 MG tablet   Oral   Take 20 mg by mouth daily.         . meloxicam (MOBIC) 15 MG tablet   Oral   Take 1 tablet (15 mg total) by mouth  daily.   30 tablet   0   . omeprazole (PRILOSEC) 40 MG capsule   Oral   Take 40 mg by mouth 2 (two) times daily.         . ondansetron (ZOFRAN-ODT) 4 MG disintegrating tablet   Oral   Take 4 mg by mouth every 8 (eight) hours as needed for nausea or vomiting.         Marland Kitchen oxyCODONE-acetaminophen (ROXICET) 5-325 MG tablet   Oral   Take 1 tablet by mouth every 6 (six) hours as needed.   20 tablet   0   . silodosin (RAPAFLO) 8 MG CAPS capsule   Oral   Take 1 capsule (8 mg total) by mouth daily with breakfast.   30 capsule   3     Discontinue flomax   . sitaGLIPtin (JANUVIA) 50 MG tablet   Oral   Take 1 tablet (50  mg total) by mouth daily.   30 tablet   2   . sucralfate (CARAFATE) 1 G tablet   Oral   Take 1 tablet by mouth 4 (four) times daily.         . tamsulosin (FLOMAX) 0.4 MG CAPS capsule   Oral   Take 1 capsule (0.4 mg total) by mouth daily.   30 capsule   11   . testosterone cypionate (DEPOTESTOSTERONE CYPIONATE) 200 MG/ML injection   Intramuscular   Inject 200 mg into the muscle every 14 (fourteen) days.           Allergies Review of patient's allergies indicates no known allergies.  Family History  Problem Relation Age of Onset  . Bladder Cancer Neg Hx   . Prostate cancer Neg Hx   . Kidney cancer Neg Hx   . Cancer Mother     Breast CA  . Stroke Father   . Heart disease Father   . Cancer Sister   . Cancer Maternal Grandmother   . Cancer Sister     Social History Social History  Substance Use Topics  . Smoking status: Never Smoker   . Smokeless tobacco: Never Used  . Alcohol Use: No     Comment: occasional     Review of Systems  Constitutional: No fever/chills Cardiovascular: no chest pain. Respiratory: no cough. No SOB. Musculoskeletal: Negative for back pain. Positive for left shoulder pain. Skin: Negative for rash. Neurological: Negative for headaches, focal weakness or numbness. 10-point ROS otherwise negative.  ____________________________________________   PHYSICAL EXAM:  VITAL SIGNS: ED Triage Vitals  Enc Vitals Group     BP 07/15/15 1411 143/74 mmHg     Pulse Rate 07/15/15 1411 108     Resp 07/15/15 1411 18     Temp 07/15/15 1411 98.1 F (36.7 C)     Temp Source 07/15/15 1411 Oral     SpO2 07/15/15 1411 95 %     Weight 07/15/15 1407 195 lb (88.451 kg)     Height 07/15/15 1407 5\' 7"  (1.702 m)     Head Cir --      Peak Flow --      Pain Score 07/15/15 1407 9     Pain Loc --      Pain Edu? --      Excl. in [place]? --      Constitutional: Alert and oriented. Well appearing and in no acute distress. Eyes: Conjunctivae are normal.  PERRL. EOMI. Head: Atraumatic. Neck: No stridor.  No cervical spine tenderness to palpation. Hematological/Lymphatic/Immunilogical: No cervical  lymphadenopathy. Cardiovascular: Normal rate, regular rhythm. Normal S1 and S2.  Good peripheral circulation. Respiratory: Normal respiratory effort without tachypnea or retractions. Lungs CTAB. Gastrointestinal: Soft and nontender. No distention. No CVA tenderness. Musculoskeletal: No lower extremity tenderness nor edema.  No joint effusions. Visible scars for laparoscopic surgery noted to left shoulder. They are healing at this time. No dehiscence of scars. Patient has limited range of motion in all directions due to pain. Patient is tender to palpation over the [facility] joint as well as the anterior incision line.Marland Kitchen Positive Neer sign. Radial pulses intact. Sensation intact 5 digits equal to the unaffected extremity. Neurologic:  Normal speech and language. No gross focal neurologic deficits are appreciated.  Skin:  Skin is warm, dry and intact. No rash noted. Psychiatric: Mood and affect are normal. Speech and behavior are normal. Patient exhibits appropriate insight and judgement.   ____________________________________________   LABS (all labs ordered are listed, but only abnormal results are displayed)  Labs Reviewed - No data to display ____________________________________________  EKG   ____________________________________________  RADIOLOGY    ____________________________________________    PROCEDURES  Procedure(s) performed:       Medications - No data to display   ____________________________________________   INITIAL IMPRESSION / ASSESSMENT AND PLAN / ED COURSE  Pertinent labs & imaging results that were available during my care of the patient were reviewed by me and considered in my medical decision making (see chart for details).  Patient's diagnosis is consistent with left shoulder pain/injury. At this time with  recent surgery to the rotator cuff I am unable to determine whether this is a reinjury of the rotator cuff or tearing of scar tissue. Patient will be given prescriptions for pain medicine and anti-inflammatories. He is to use the sling that he has at home for additional symptom control. Patient is advised that he must follow-up with his orthopedic surgeon at [facility] for further evaluation and treatment of this. Patient verbalizes understanding of diagnosis and treatment plan and verbalizes compliance with same.    ____________________________________________  FINAL CLINICAL IMPRESSION(S) / ED DIAGNOSES  Final diagnoses:  Shoulder injury, left, initial encounter  Disorder of left rotator cuff      NEW MEDICATIONS STARTED DURING THIS VISIT:  New Prescriptions   HYDROCODONE-ACETAMINOPHEN (NORCO/VICODIN) 5-325 MG TABLET    Take 1 tablet by mouth every 4 (four) hours as needed for moderate pain.   MELOXICAM (MOBIC) 15 MG TABLET    Take 1 tablet (15 mg total) by mouth daily.        Charline Bills Cuthriell, PA-C 07/15/15 1601  Harvest Dark, MD 07/15/15 2140

## 2015-07-16 LAB — LIPID PANEL
CHOLESTEROL TOTAL: 174 mg/dL (ref 100–199)
Chol/HDL Ratio: 6.7 ratio units — ABNORMAL HIGH (ref 0.0–5.0)
HDL: 26 mg/dL — ABNORMAL LOW (ref >39)
LDL CALC: 102 mg/dL — AB (ref 0–99)
Triglycerides: 232 mg/dL — ABNORMAL HIGH (ref 0–149)
VLDL Cholesterol Cal: 46 mg/dL — ABNORMAL HIGH (ref 5–40)

## 2015-07-16 LAB — COMPREHENSIVE METABOLIC PANEL
A/G RATIO: 1.5 (ref 1.1–2.5)
ALK PHOS: 48 IU/L (ref 39–117)
ALT: 41 IU/L (ref 0–44)
AST: 33 IU/L (ref 0–40)
Albumin: 4 g/dL (ref 3.5–5.5)
BILIRUBIN TOTAL: 0.6 mg/dL (ref 0.0–1.2)
BUN/Creatinine Ratio: 12 (ref 9–20)
BUN: 13 mg/dL (ref 6–24)
CHLORIDE: 95 mmol/L — AB (ref 96–106)
CO2: 25 mmol/L (ref 18–29)
Calcium: 9.7 mg/dL (ref 8.7–10.2)
Creatinine, Ser: 1.1 mg/dL (ref 0.76–1.27)
GFR calc Af Amer: 84 mL/min/{1.73_m2} (ref >59)
GFR calc non Af Amer: 73 mL/min/{1.73_m2} (ref >59)
Globulin, Total: 2.6 g/dL (ref 1.5–4.5)
Glucose: 147 mg/dL — ABNORMAL HIGH (ref 65–99)
POTASSIUM: 3.8 mmol/L (ref 3.5–5.2)
SODIUM: 139 mmol/L (ref 134–144)
Total Protein: 6.6 g/dL (ref 6.0–8.5)

## 2015-07-16 LAB — CBC WITH DIFFERENTIAL/PLATELET
BASOS ABS: 0 10*3/uL (ref 0.0–0.2)
BASOS: 0 %
EOS (ABSOLUTE): 0.2 10*3/uL (ref 0.0–0.4)
Eos: 2 %
Hematocrit: 49.4 % (ref 37.5–51.0)
Hemoglobin: 16.9 g/dL (ref 12.6–17.7)
IMMATURE GRANS (ABS): 0 10*3/uL (ref 0.0–0.1)
IMMATURE GRANULOCYTES: 0 %
LYMPHS: 31 %
Lymphocytes Absolute: 2.9 10*3/uL (ref 0.7–3.1)
MCH: 29.6 pg (ref 26.6–33.0)
MCHC: 34.2 g/dL (ref 31.5–35.7)
MCV: 87 fL (ref 79–97)
MONOCYTES: 8 %
Monocytes Absolute: 0.7 10*3/uL (ref 0.1–0.9)
NEUTROS PCT: 59 %
Neutrophils Absolute: 5.4 10*3/uL (ref 1.4–7.0)
PLATELETS: 300 10*3/uL (ref 150–379)
RBC: 5.71 x10E6/uL (ref 4.14–5.80)
RDW: 14 % (ref 12.3–15.4)
WBC: 9.2 10*3/uL (ref 3.4–10.8)

## 2015-07-20 ENCOUNTER — Ambulatory Visit: Payer: Medicare Other | Admitting: Family Medicine

## 2015-07-20 ENCOUNTER — Ambulatory Visit: Payer: Medicare Other

## 2015-07-25 ENCOUNTER — Ambulatory Visit: Admission: RE | Admit: 2015-07-25 | Payer: Medicare Other | Source: Ambulatory Visit

## 2015-07-26 ENCOUNTER — Encounter: Payer: Self-pay | Admitting: Family Medicine

## 2015-07-26 ENCOUNTER — Ambulatory Visit (INDEPENDENT_AMBULATORY_CARE_PROVIDER_SITE_OTHER): Payer: Medicare Other | Admitting: Family Medicine

## 2015-07-26 ENCOUNTER — Ambulatory Visit
Admission: RE | Admit: 2015-07-26 | Discharge: 2015-07-26 | Disposition: A | Discharge status: Home / Self Care | Payer: Medicare Other | Source: Ambulatory Visit | Attending: Family Medicine | Admitting: Family Medicine

## 2015-07-26 VITALS — BP 122/80 | HR 84 | Temp 99.0°F | Resp 18 | Ht 67.0 in | Wt 189.5 lb

## 2015-07-26 DIAGNOSIS — M25511 Pain in right shoulder: Secondary | ICD-10-CM | POA: Diagnosis not present

## 2015-07-26 DIAGNOSIS — R1010 Upper abdominal pain, unspecified: Secondary | ICD-10-CM | POA: Diagnosis not present

## 2015-07-26 DIAGNOSIS — E781 Pure hyperglyceridemia: Secondary | ICD-10-CM | POA: Diagnosis not present

## 2015-07-26 DIAGNOSIS — G8929 Other chronic pain: Secondary | ICD-10-CM

## 2015-07-26 DIAGNOSIS — E7800 Pure hypercholesterolemia, unspecified: Secondary | ICD-10-CM

## 2015-07-26 DIAGNOSIS — R1011 Right upper quadrant pain: Secondary | ICD-10-CM

## 2015-07-26 DIAGNOSIS — N281 Cyst of kidney, acquired: Secondary | ICD-10-CM | POA: Diagnosis not present

## 2015-07-26 MED ORDER — ROSUVASTATIN CALCIUM 20 MG PO TABS: 20.0000 mg | ORAL_TABLET | Freq: Every day | ORAL | Status: DC

## 2015-07-26 MED ORDER — OXYCODONE-ACETAMINOPHEN 5-325 MG PO TABS: 1.0000 | ORAL_TABLET | Freq: Three times a day (TID) | ORAL | Status: DC | PRN

## 2015-07-26 MED ORDER — ICOSAPENT ETHYL 1 G PO CAPS: 2.0000 g | ORAL_CAPSULE | Freq: Two times a day (BID) | ORAL | Status: DC

## 2015-07-26 NOTE — Progress Notes (Signed)
Name: Howard Murphy   MRN: [id number]    DOB: 1955-07-06   Date:07/26/2015       Progress Note  Subjective  Chief Complaint  Chief Complaint  Patient presents with  . Hyperlipidemia    pt here to change statin therapy    Hyperlipidemia This is a chronic problem. The problem is uncontrolled. Recent lipid tests were reviewed and are high (Low HDL, elevated TG and LDL). Current antihyperlipidemic treatment includes statins. The current treatment provides moderate improvement of lipids.   Referral to Pain Management Patient is requesting referral to Pain management for chronic left shoulder, right shoulder, and lower back pain going down the right thigh. Patient is being seen and followed by Orthopedics in [place] (does not remember the name of surgeon). His first surgery in right shoulder was in [place] by  Dr. Sabra Heck, followed by a repeat surgery in [place]. He had surgery in left shoulder last month, again by Orhopedics in [place]. He tells me that he is experiencing pain in both shoulder and that Orthopedics want him to undergo Physical Therapy which is not covered by [employer]. He reports Percocet provided by the Orthopedic Surgeon helped relieve the pain. He requests refill to Pain Management and enough Percocet to last him until his appointment. [facility] ED note reviewed and discussed with patient in detail Past Medical History  Diagnosis Date  . Diabetes mellitus without complication ([place])   . Hypertension   . GERD (gastroesophageal reflux disease)   . Hyperlipemia   . Nephrolithiasis   . Shortness of breath dyspnea   . 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    Past Surgical History  Procedure Laterality Date  . Cholecystectomy  2000  . Back surgery  2015  . Shoulder surgery Right 2016  . Colonoscopy with propofol N/A 04/11/2015    Procedure: COLONOSCOPY WITH PROPOFOL;  Surgeon: Manya Silvas, MD;  Location[facility] ENDOSCOPY;  Service: Endoscopy;  Laterality: N/A;  .  Esophagogastroduodenoscopy (egd) with propofol N/A 04/11/2015    Procedure: ESOPHAGOGASTRODUODENOSCOPY (EGD) WITH PROPOFOL;  Surgeon: Manya Silvas, MD;  Location: [facility] ENDOSCOPY;  Service: Endoscopy;  Laterality: N/A;    Family History  Problem Relation Age of Onset  . Bladder Cancer Neg Hx   . Prostate cancer Neg Hx   . Kidney cancer Neg Hx   . Cancer Mother     Breast CA  . Stroke Father   . Heart disease Father   . Cancer Sister   . Cancer Maternal Grandmother   . Cancer Sister     Social History   Social History  . Marital Status: Single    Spouse Name: N/A  . Number of Children: N/A  . Years of Education: N/A   Occupational History  . Not on file.   Social History Main Topics  . Smoking status: Never Smoker   . Smokeless tobacco: Never Used  . Alcohol Use: No     Comment: occasional  . Drug Use: No  . Sexual Activity: Yes   Other Topics Concern  . Not on file   Social History Narrative     Current outpatient prescriptions:  .  atorvastatin (LIPITOR) 40 MG tablet, Take 40 mg by mouth daily., Disp: , Rfl:  .  chlorthalidone (HYGROTON) 50 MG tablet, Take 50 mg by mouth daily., Disp: , Rfl:  .  finasteride (PROSCAR) 5 MG tablet, Take 1 tablet (5 mg total) by mouth daily., Disp: 30 tablet, Rfl: 6 .  HYDROcodone-acetaminophen (NORCO/VICODIN) 5-325 MG tablet, Take 1 tablet by mouth every 4 (four) hours as needed for moderate pain., Disp: 20 tablet, Rfl: 0 .  HYDROmorphone (DILAUDID) 2 MG tablet, , Disp: , Rfl:  .  lisinopril (PRINIVIL,ZESTRIL) 20 MG tablet, Take 20 mg by mouth daily., Disp: , Rfl:  .  meloxicam (MOBIC) 15 MG tablet, Take 1 tablet (15 mg total) by mouth daily., Disp: 30 tablet, Rfl: 0 .  morphine (MS CONTIN) 15 MG 12 hr tablet, , Disp: , Rfl:  .  omeprazole (PRILOSEC) 40 MG capsule, Take 40 mg by mouth 2 (two) times daily., Disp: , Rfl:  .  ondansetron (ZOFRAN-ODT) 4 MG disintegrating tablet, Take 4 mg by mouth every 8 (eight) hours as needed  for nausea or vomiting., Disp: , Rfl:  .  oxyCODONE (OXY IR/ROXICODONE) 5 MG immediate release tablet, , Disp: , Rfl:  .  oxyCODONE-acetaminophen (ROXICET) 5-325 MG tablet, Take 1 tablet by mouth every 6 (six) hours as needed., Disp: 20 tablet, Rfl: 0 .  silodosin (RAPAFLO) 8 MG CAPS capsule, Take 1 capsule (8 mg total) by mouth daily with breakfast., Disp: 30 capsule, Rfl: 3 .  sitaGLIPtin (JANUVIA) 50 MG tablet, Take 1 tablet (50 mg total) by mouth daily., Disp: 30 tablet, Rfl: 2 .  sucralfate (CARAFATE) 1 G tablet, Take 1 tablet by mouth 4 (four) times daily., Disp: , Rfl:  .  tamsulosin (FLOMAX) 0.4 MG CAPS capsule, Take 1 capsule (0.4 mg total) by mouth daily., Disp: 30 capsule, Rfl: 11 .  testosterone cypionate (DEPOTESTOSTERONE CYPIONATE) 200 MG/ML injection, Inject 200 mg into the muscle every 14 (fourteen) days., Disp: , Rfl:   No Known Allergies   Review of Systems  Musculoskeletal: Positive for back pain and joint pain.    Objective  Filed Vitals:   07/26/15 1534  BP: 122/80  Pulse: 84  Temp: 99 F (37.2 C)  Resp: 18  Height: 5\' 7"  (1.702 m)  Weight: 189 lb 8 oz (85.957 kg)  SpO2: 96%    Physical Exam  Constitutional: He is well-developed, well-nourished, and in no distress.  Cardiovascular: Normal rate, regular rhythm and normal heart sounds.   Pulmonary/Chest: Effort normal and breath sounds normal.  Abdominal: Soft. Bowel sounds are normal.  Musculoskeletal:       Right shoulder: He exhibits tenderness and pain. He exhibits no swelling.       Arms: Nursing note and vitals reviewed.    Assessment & Plan  1. Hypercholesteremia DC atorvastatin and start on Crestor for optimal control of lipid panel - rosuvastatin (CRESTOR) 20 MG tablet; Take 1 tablet (20 mg total) by mouth daily.  Dispense: 30 tablet; Refill: 2  2. Hypertriglyceridemia Start on Vascepa 2g twice daily for optimal control of elevated triglycerides - Icosapent Ethyl 1 g CAPS; Take 2 g by  mouth 2 (two) times daily after a meal.  Dispense: 120 capsule; Refill: 2  3. Chronic right shoulder pain Recommended that he follow up with the orthopedic surgeon in [facility] for chronic right shoulder pain. Will refer to pain management at patient's request and will provide Percocet 5mg  for 10 days until he is established with pain management. - oxyCODONE-acetaminophen (ROXICET) 5-325 MG tablet; Take 1 tablet by mouth every 8 (eight) hours as needed.  Dispense: 30 tablet; Refill: 0 - Ambulatory referral to Pain Clinic   [facility] A. [facility] 07/26/2015 3:49 PM

## 2015-08-07 ENCOUNTER — Encounter: Payer: Self-pay | Admitting: Emergency Medicine

## 2015-08-07 ENCOUNTER — Emergency Department
Admission: EM | Admit: 2015-08-07 | Discharge: 2015-08-07 | Disposition: A | Discharge status: Home / Self Care | Payer: Medicare Other | Attending: Emergency Medicine | Admitting: Emergency Medicine

## 2015-08-07 DIAGNOSIS — E1142 Type 2 diabetes mellitus with diabetic polyneuropathy: Secondary | ICD-10-CM | POA: Diagnosis not present

## 2015-08-07 DIAGNOSIS — J029 Acute pharyngitis, unspecified: Secondary | ICD-10-CM

## 2015-08-07 DIAGNOSIS — I10 Essential (primary) hypertension: Secondary | ICD-10-CM | POA: Diagnosis not present

## 2015-08-07 DIAGNOSIS — Z79899 Other long term (current) drug therapy: Secondary | ICD-10-CM | POA: Insufficient documentation

## 2015-08-07 DIAGNOSIS — Z791 Long term (current) use of non-steroidal anti-inflammatories (NSAID): Secondary | ICD-10-CM | POA: Insufficient documentation

## 2015-08-07 MED ORDER — LIDOCAINE VISCOUS 2 % MT SOLN: 20.0000 mL | OROMUCOSAL | Status: DC | PRN

## 2015-08-07 MED ORDER — AMOXICILLIN 500 MG PO TABS: 500.0000 mg | ORAL_TABLET | Freq: Two times a day (BID) | ORAL | Status: DC

## 2015-08-07 NOTE — Discharge Instructions (Signed)

## 2015-08-07 NOTE — ED Provider Notes (Signed)
[facility] Emergency Department Provider Note  ____________________________________________  Time seen: Approximately 8:38 AM  I have reviewed the triage vital signs and the nursing notes.   HISTORY  Chief Complaint Sore throat times one day.   HPI Howard Murphy is a 60 y.o. male resents for some onset of sore throat and hurting to swallow. Patient reports that she was he was around a friend who was positive for strep 2 days ago. Denies any fever chills. States his throat hurts to swallow no relief with over-the-counter medications, lozenges, or drinking water. Patient denies any fever chills or body aches.Unable to quantify the pain just hurts when he swallows. Probable about a 6/10 if he had a given a number.   Past Medical History  Diagnosis Date  . Diabetes mellitus without complication ([place])   . Hypertension   . GERD (gastroesophageal reflux disease)   . Hyperlipemia   . Nephrolithiasis   . Shortness of breath dyspnea   . 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    Patient Active Problem List   Diagnosis Date Noted  . Hypertriglyceridemia 07/26/2015  . Chronic right shoulder pain 07/26/2015  . Dyspnea 07/14/2015  . Abdominal pain, chronic, right upper quadrant 07/14/2015  . Type 2 diabetes mellitus with peripheral neuropathy ([place]) 07/14/2015  . Chest pain 07/14/2015  . Bilateral hand pain 06/07/2015  . Breath shortness 03/22/2015  . Diabetes ([place]) 07/18/2013  . Elevated hemoglobin ([place]) 07/07/2013  . Ache in joint 07/07/2013  . Arthritis, degenerative 07/07/2013  . Lumbar canal stenosis 05/29/2013  . Synovial cyst of lumbar spine 05/29/2013  . Back ache 05/05/2013  . 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 Acid reflux 05/05/2013  . HLD (hyperlipidemia) 05/05/2013  . BP (high blood pressure) 05/08/2011  . Hypercholesteremia 05/08/2011  . Arthritis or polyarthritis, rheumatoid ([place]) 05/08/2011  . Rheumatoid arthritis ([place]) 05/08/2011     Past Surgical History  Procedure Laterality Date  . Cholecystectomy  2000  . Back surgery  2015  . Shoulder surgery Right 2016  . Colonoscopy with propofol N/A 04/11/2015    Procedure: COLONOSCOPY WITH PROPOFOL;  Surgeon: Manya Silvas, MD;  Location: [facility] ENDOSCOPY;  Service: Endoscopy;  Laterality: N/A;  . Esophagogastroduodenoscopy (egd) with propofol N/A 04/11/2015    Procedure: ESOPHAGOGASTRODUODENOSCOPY (EGD) WITH PROPOFOL;  Surgeon: Manya Silvas, MD;  Location: [facility] ENDOSCOPY;  Service: Endoscopy;  Laterality: N/A;    Current Outpatient Rx  Name  Route  Sig  Dispense  Refill  . amoxicillin (AMOXIL) 500 MG tablet   Oral   Take 1 tablet (500 mg total) by mouth 2 (two) times daily.   20 tablet   0   . chlorthalidone (HYGROTON) 50 MG tablet   Oral   Take 50 mg by mouth daily.         . finasteride (PROSCAR) 5 MG tablet   Oral   Take 1 tablet (5 mg total) by mouth daily.   30 tablet   6   . Icosapent Ethyl 1 g CAPS   Oral   Take 2 g by mouth 2 (two) times daily after a meal.   120 capsule   2   . lidocaine (XYLOCAINE) 2 % solution   Mouth/Throat   Use as directed 20 mLs in the mouth or throat as needed for mouth pain.   100 mL   0   . lisinopril (PRINIVIL,ZESTRIL) 20 MG tablet   Oral   Take 20 mg by  mouth daily.         . meloxicam (MOBIC) 15 MG tablet   Oral   Take 1 tablet (15 mg total) by mouth daily.   30 tablet   0   . omeprazole (PRILOSEC) 40 MG capsule   Oral   Take 40 mg by mouth 2 (two) times daily.         . ondansetron (ZOFRAN-ODT) 4 MG disintegrating tablet   Oral   Take 4 mg by mouth every 8 (eight) hours as needed for nausea or vomiting.         Marland Kitchen oxyCODONE-acetaminophen (ROXICET) 5-325 MG tablet   Oral   Take 1 tablet by mouth every 8 (eight) hours as needed.   30 tablet   0   . rosuvastatin (CRESTOR) 20 MG tablet   Oral   Take 1 tablet (20 mg total) by mouth daily.   30 tablet   2   . silodosin (RAPAFLO) 8  MG CAPS capsule   Oral   Take 1 capsule (8 mg total) by mouth daily with breakfast.   30 capsule   3     Discontinue flomax   . sitaGLIPtin (JANUVIA) 50 MG tablet   Oral   Take 1 tablet (50 mg total) by mouth daily.   30 tablet   2   . sucralfate (CARAFATE) 1 G tablet   Oral   Take 1 tablet by mouth 4 (four) times daily.         . tamsulosin (FLOMAX) 0.4 MG CAPS capsule   Oral   Take 1 capsule (0.4 mg total) by mouth daily.   30 capsule   11   . testosterone cypionate (DEPOTESTOSTERONE CYPIONATE) 200 MG/ML injection   Intramuscular   Inject 200 mg into the muscle every 14 (fourteen) days.           Allergies Review of patient's allergies indicates no known allergies.  Family History  Problem Relation Age of Onset  . Bladder Cancer Neg Hx   . Prostate cancer Neg Hx   . Kidney cancer Neg Hx   . Cancer Mother     Breast CA  . Stroke Father   . Heart disease Father   . Cancer Sister   . Cancer Maternal Grandmother   . Cancer Sister     Social History Social History  Substance Use Topics  . Smoking status: Never Smoker   . Smokeless tobacco: Never Used  . Alcohol Use: No     Comment: occasional    Review of Systems Constitutional: No fever/chills Eyes: No visual changes. ENT: Positive for sore throat. Cardiovascular: Denies chest pain. Respiratory: Denies shortness of breath. Eyes any coughing Musculoskeletal: Negative for back pain. Skin: Negative for rash. Neurological: Negative for headaches, focal weakness or numbness.  10-point ROS otherwise negative.  ____________________________________________   PHYSICAL EXAM:  VITAL SIGNS: ED Triage Vitals  Enc Vitals Group     BP 08/07/15 0836 134/94 mmHg     Pulse Rate 08/07/15 0836 73     Resp 08/07/15 0836 18     Temp 08/07/15 0836 97.8 F (36.6 C)     Temp Source 08/07/15 0836 Oral     SpO2 08/07/15 0836 97 %     Weight 08/07/15 0836 190 lb (86.183 kg)     Height 08/07/15 0836 5\' 7"   (1.702 m)     Head Cir --      Peak Flow --      Pain Score --  Pain Loc --      Pain Edu? --      Excl. in [place]? --     Constitutional: Alert and oriented. Well appearing and in no acute distress. Nose: No congestion/rhinnorhea. Mouth/Throat: Mucous membranes are moist.  Oropharynx mildly erythematous without exudate. Neck: No stridor.  No cervical adenopathy noted. Cardiovascular: Normal rate, regular rhythm. Grossly normal heart sounds.  Good peripheral circulation. Respiratory: Normal respiratory effort.  No retractions. Lungs CTAB. Musculoskeletal: No lower extremity tenderness nor edema.  No joint effusions. Neurologic:  Normal speech and language. No gross focal neurologic deficits are appreciated. No gait instability. Skin:  Skin is warm, dry and intact. No rash noted. Psychiatric: Mood and affect are normal. Speech and behavior are normal.  ____________________________________________   LABS (all labs ordered are listed, but only abnormal results are displayed)  Labs Reviewed - No data to display ____________________________________________   PROCEDURES  Procedure(s) performed: None  Critical Care performed: No  ____________________________________________   INITIAL IMPRESSION / ASSESSMENT AND PLAN / ED COURSE  Pertinent labs & imaging results that were available during my care of the patient were reviewed by me and considered in my medical decision making (see chart for details).  Acute pharyngitis. Rx given for amoxicillin 500 mg 3 times a day #30. Viscous lidocaine this to gargle as needed. Patient to follow up with PCP or return to ER with any worsening symptomology.  Patient voices no other emergency medical complaints at this time. ____________________________________________   FINAL CLINICAL IMPRESSION(S) / ED DIAGNOSES  Final diagnoses:  Pharyngitis     This chart was dictated using voice recognition software/Dragon. Despite best efforts to  proofread, errors can occur which can change the meaning. Any change was purely unintentional.   Arlyss Repress, PA-C 08/07/15 0845  Lavonia Drafts, MD 08/07/15 [phone]

## 2015-08-07 NOTE — ED Notes (Signed)
E signature pad not working 

## 2015-08-07 NOTE — ED Notes (Signed)
Pt states sore throat started last night. Pt recently around another person with strep throat. Denies fever or any other sxs.

## 2015-08-30 ENCOUNTER — Ambulatory Visit: Payer: Medicare Other | Admitting: Cardiovascular Disease

## 2015-09-05 ENCOUNTER — Other Ambulatory Visit
Admission: RE | Admit: 2015-09-05 | Discharge: 2015-09-05 | Disposition: A | Discharge status: Home / Self Care | Payer: Medicare Other | Source: Ambulatory Visit | Attending: Pain Medicine | Admitting: Pain Medicine

## 2015-09-05 ENCOUNTER — Other Ambulatory Visit: Payer: Self-pay

## 2015-09-05 ENCOUNTER — Encounter: Payer: Self-pay | Admitting: Pain Medicine

## 2015-09-05 ENCOUNTER — Ambulatory Visit (HOSPITAL_BASED_OUTPATIENT_CLINIC_OR_DEPARTMENT_OTHER): Payer: Medicare Other | Admitting: Pain Medicine

## 2015-09-05 ENCOUNTER — Ambulatory Visit
Admission: RE | Admit: 2015-09-05 | Discharge: 2015-09-05 | Disposition: A | Discharge status: Home / Self Care | Payer: Medicare Other | Source: Ambulatory Visit | Attending: Pain Medicine | Admitting: Pain Medicine

## 2015-09-05 ENCOUNTER — Emergency Department
Admission: EM | Admit: 2015-09-05 | Discharge: 2015-09-05 | Disposition: A | Discharge status: Home / Self Care | Payer: Medicare Other | Attending: Emergency Medicine | Admitting: Emergency Medicine

## 2015-09-05 ENCOUNTER — Telehealth: Payer: Self-pay | Admitting: Family Medicine

## 2015-09-05 VITALS — BP 155/104 | HR 73 | Temp 98.5°F | Resp 16 | Ht 67.0 in | Wt 195.0 lb

## 2015-09-05 DIAGNOSIS — E876 Hypokalemia: Secondary | ICD-10-CM | POA: Diagnosis not present

## 2015-09-05 DIAGNOSIS — M25511 Pain in right shoulder: Secondary | ICD-10-CM | POA: Diagnosis not present

## 2015-09-05 DIAGNOSIS — G5793 Unspecified mononeuropathy of bilateral lower limbs: Secondary | ICD-10-CM | POA: Insufficient documentation

## 2015-09-05 DIAGNOSIS — G8929 Other chronic pain: Secondary | ICD-10-CM | POA: Insufficient documentation

## 2015-09-05 DIAGNOSIS — M79641 Pain in right hand: Secondary | ICD-10-CM | POA: Insufficient documentation

## 2015-09-05 DIAGNOSIS — M5136 Other intervertebral disc degeneration, lumbar region: Secondary | ICD-10-CM

## 2015-09-05 DIAGNOSIS — F1190 Opioid use, unspecified, uncomplicated: Secondary | ICD-10-CM | POA: Diagnosis not present

## 2015-09-05 DIAGNOSIS — Z79899 Other long term (current) drug therapy: Secondary | ICD-10-CM | POA: Diagnosis not present

## 2015-09-05 DIAGNOSIS — M79673 Pain in unspecified foot: Secondary | ICD-10-CM

## 2015-09-05 DIAGNOSIS — I10 Essential (primary) hypertension: Secondary | ICD-10-CM | POA: Insufficient documentation

## 2015-09-05 DIAGNOSIS — R51 Headache: Secondary | ICD-10-CM

## 2015-09-05 DIAGNOSIS — M545 Low back pain, unspecified: Secondary | ICD-10-CM

## 2015-09-05 DIAGNOSIS — M25559 Pain in unspecified hip: Secondary | ICD-10-CM | POA: Diagnosis not present

## 2015-09-05 DIAGNOSIS — M47896 Other spondylosis, lumbar region: Secondary | ICD-10-CM

## 2015-09-05 DIAGNOSIS — R531 Weakness: Secondary | ICD-10-CM | POA: Diagnosis present

## 2015-09-05 DIAGNOSIS — R7989 Other specified abnormal findings of blood chemistry: Secondary | ICD-10-CM | POA: Insufficient documentation

## 2015-09-05 DIAGNOSIS — E785 Hyperlipidemia, unspecified: Secondary | ICD-10-CM | POA: Diagnosis not present

## 2015-09-05 DIAGNOSIS — Z5181 Encounter for therapeutic drug level monitoring: Secondary | ICD-10-CM

## 2015-09-05 DIAGNOSIS — D291 Benign neoplasm of prostate: Secondary | ICD-10-CM | POA: Insufficient documentation

## 2015-09-05 DIAGNOSIS — M79643 Pain in unspecified hand: Secondary | ICD-10-CM

## 2015-09-05 DIAGNOSIS — M25512 Pain in left shoulder: Secondary | ICD-10-CM

## 2015-09-05 DIAGNOSIS — M1990 Unspecified osteoarthritis, unspecified site: Secondary | ICD-10-CM | POA: Insufficient documentation

## 2015-09-05 DIAGNOSIS — E1142 Type 2 diabetes mellitus with diabetic polyneuropathy: Secondary | ICD-10-CM

## 2015-09-05 DIAGNOSIS — Z0189 Encounter for other specified special examinations: Secondary | ICD-10-CM

## 2015-09-05 DIAGNOSIS — R519 Headache, unspecified: Secondary | ICD-10-CM

## 2015-09-05 DIAGNOSIS — Z79891 Long term (current) use of opiate analgesic: Secondary | ICD-10-CM | POA: Insufficient documentation

## 2015-09-05 DIAGNOSIS — M4326 Fusion of spine, lumbar region: Secondary | ICD-10-CM | POA: Diagnosis not present

## 2015-09-05 DIAGNOSIS — M25552 Pain in left hip: Secondary | ICD-10-CM

## 2015-09-05 DIAGNOSIS — M5416 Radiculopathy, lumbar region: Secondary | ICD-10-CM

## 2015-09-05 DIAGNOSIS — M533 Sacrococcygeal disorders, not elsewhere classified: Secondary | ICD-10-CM

## 2015-09-05 DIAGNOSIS — M961 Postlaminectomy syndrome, not elsewhere classified: Secondary | ICD-10-CM | POA: Insufficient documentation

## 2015-09-05 DIAGNOSIS — M79642 Pain in left hand: Secondary | ICD-10-CM

## 2015-09-05 DIAGNOSIS — E119 Type 2 diabetes mellitus without complications: Secondary | ICD-10-CM

## 2015-09-05 DIAGNOSIS — M47816 Spondylosis without myelopathy or radiculopathy, lumbar region: Secondary | ICD-10-CM | POA: Insufficient documentation

## 2015-09-05 DIAGNOSIS — E291 Testicular hypofunction: Secondary | ICD-10-CM

## 2015-09-05 LAB — COMPREHENSIVE METABOLIC PANEL
ALT: 46 U/L (ref 17–63)
AST: 38 U/L (ref 15–41)
Albumin: 4.6 g/dL (ref 3.5–5.0)
Alkaline Phosphatase: 40 U/L (ref 38–126)
Anion gap: 12 (ref 5–15)
BUN: 13 mg/dL (ref 6–20)
CALCIUM: 9.5 mg/dL (ref 8.9–10.3)
CHLORIDE: 100 mmol/L — AB (ref 101–111)
CO2: 23 mmol/L (ref 22–32)
CREATININE: 1.02 mg/dL (ref 0.61–1.24)
Glucose, Bld: 104 mg/dL — ABNORMAL HIGH (ref 65–99)
Potassium: 2.9 mmol/L — CL (ref 3.5–5.1)
Sodium: 135 mmol/L (ref 135–145)
Total Bilirubin: 0.9 mg/dL (ref 0.3–1.2)
Total Protein: 8 g/dL (ref 6.5–8.1)

## 2015-09-05 LAB — BASIC METABOLIC PANEL
Anion gap: 7 (ref 5–15)
BUN: 14 mg/dL (ref 6–20)
CALCIUM: 9.6 mg/dL (ref 8.9–10.3)
CO2: 28 mmol/L (ref 22–32)
Chloride: 100 mmol/L — ABNORMAL LOW (ref 101–111)
Creatinine, Ser: 1.05 mg/dL (ref 0.61–1.24)
GFR calc Af Amer: >60 mL/min (ref >60)
GLUCOSE: 115 mg/dL — AB (ref 65–99)
Potassium: 2.9 mmol/L — CL (ref 3.5–5.1)
Sodium: 135 mmol/L (ref 135–145)

## 2015-09-05 LAB — SEDIMENTATION RATE: SED RATE: 1 mm/h (ref 0–20)

## 2015-09-05 LAB — MAGNESIUM
MAGNESIUM: 1.9 mg/dL (ref 1.7–2.4)
MAGNESIUM: 2.1 mg/dL (ref 1.7–2.4)

## 2015-09-05 MED ORDER — POTASSIUM CHLORIDE CRYS ER 20 MEQ PO TBCR: 20.0000 meq | EXTENDED_RELEASE_TABLET | Freq: Every day | ORAL | Status: DC

## 2015-09-05 MED ORDER — POTASSIUM CHLORIDE CRYS ER 20 MEQ PO TBCR
40.0000 meq | EXTENDED_RELEASE_TABLET | Freq: Once | ORAL | Status: AC
  Administered 2015-09-05: 40 meq via ORAL
  Filled 2015-09-05: qty 2

## 2015-09-05 NOTE — Progress Notes (Signed)
Quick Note:   Potassium levels below 3.6 mmol/L are considered to be low. Levels (less than 2.5 mmol/L) can be life-threatening and requires urgent medical attention. Low potassium (hypokalemia) has many causes. The most common cause is excessive potassium loss in urine due to prescription water or fluid pills (diuretics). Vomiting or diarrhea or both can result in excessive potassium loss from the digestive tract. Causes of potassium loss leading to low potassium include: chronic kidney disease; diabetic ketoacidosis; diarrhea; excessive alcohol use; excessive laxative use; excessive sweating; folic acid deficiency; diuretics; primary aldosteronism; vomiting; and/or some antibiotic use. Patient personally called at [phone] [facility]) and notified of the results and instructed to immediately contact his primary care physician, Dr. Rochel Brome for assistance in replacing this potassium. Instructions given to the patient's fianc "[name]". She was instructed to keep an eye on him and if by any chance he starts having any symptoms of palpitations, weakness, or generalized malaise, she should immediately take him to then nearest urgent care facility or emergency department.  ______

## 2015-09-05 NOTE — Progress Notes (Signed)
Safety precautions to be maintained throughout the outpatient stay will include: orient to surroundings, keep bed in low position, maintain call bell within reach at all times, provide assistance with transfer out of bed and ambulation.  

## 2015-09-05 NOTE — Assessment & Plan Note (Signed)
This may be either secondary to the patient's diabetic peripheral neuropathy and/or bilateral lumbosacral radiculitis affecting the S1 dermatome.

## 2015-09-05 NOTE — Discharge Instructions (Signed)
As we discussed, your potassium level is low, but not dangerously low.  It is likely due to one of your blood pressure medications (HCTZ).  We gave you a dose of a supplement in the Emergency Department and a prescription for additional supplements.  Please follow up with Dr. Manuella Ghazi tomorrow as he requested.   Hypokalemia Hypokalemia means that the amount of potassium in the blood is lower than normal.Potassium is a chemical, called an electrolyte, that helps regulate the amount of fluid in the body. It also stimulates muscle contraction and helps nerves function properly.Most of the body's potassium is inside of cells, and only a very small amount is in the blood. Because the amount in the blood is so small, minor changes can be life-threatening. CAUSES  Antibiotics.  Diarrhea or vomiting.  Using laxatives too much, which can cause diarrhea.  Chronic kidney disease.  Water pills (diuretics).  Eating disorders (bulimia).  Low magnesium level.  Sweating a lot. SIGNS AND SYMPTOMS  Weakness.  Constipation.  Fatigue.  Muscle cramps.  Mental confusion.  Skipped heartbeats or irregular heartbeat (palpitations).  Tingling or numbness. DIAGNOSIS  Your health care provider can diagnose hypokalemia with blood tests. In addition to checking your potassium level, your health care provider may also check other lab tests. TREATMENT Hypokalemia can be treated with potassium supplements taken by mouth or adjustments in your current medicines. If your potassium level is very low, you may need to get potassium through a vein (IV) and be monitored in the hospital. A diet high in potassium is also helpful. Foods high in potassium are:  Nuts, such as peanuts and pistachios.  Seeds, such as sunflower seeds and pumpkin seeds.  Peas, lentils, and lima beans.  Whole grain and bran cereals and breads.  Fresh fruit and vegetables, such as apricots, avocado, bananas, cantaloupe, kiwi, oranges,  tomatoes, asparagus, and potatoes.  Orange and tomato juices.  Red meats.  Fruit yogurt. HOME CARE INSTRUCTIONS  Take all medicines as prescribed by your health care provider.  Maintain a healthy diet by including nutritious food, such as fruits, vegetables, nuts, whole grains, and lean meats.  If you are taking a laxative, be sure to follow the directions on the label. SEEK MEDICAL CARE IF:  Your weakness gets worse.  You feel your heart pounding or racing.  You are vomiting or having diarrhea.  You are diabetic and having trouble keeping your blood glucose in the normal range. SEEK IMMEDIATE MEDICAL CARE IF:  You have chest pain, shortness of breath, or dizziness.  You are vomiting or having diarrhea for more than 2 days.  You faint. MAKE SURE YOU:   Understand these instructions.  Will watch your condition.  Will get help right away if you are not doing well or get worse.   This information is not intended to replace advice given to you by your health care provider. Make sure you discuss any questions you have with your health care provider.   Document Released: 05/28/2005 Document Revised: 06/18/2014 Document Reviewed: 11/28/2012 Elsevier Interactive Patient Education 2016 [place].  Potassium Content of Foods Potassium is a mineral found in many foods and drinks. It helps keep fluids and minerals balanced in your body and affects how steadily your heart beats. Potassium also helps control your blood pressure and keep your muscles and nervous system healthy. Certain health conditions and medicines may change the balance of potassium in your body. When this happens, you can help balance your level of potassium  through the foods that you do or do not eat. Your health care provider or dietitian may recommend an amount of potassium that you should have each day. The following lists of foods provide the amount of potassium (in parentheses) per serving in each  item. HIGH IN POTASSIUM  The following foods and beverages have 200 mg or more of potassium per serving:  Apricots, 2 raw or 5 dry (200 mg).  Artichoke, 1 medium (345 mg).  Avocado, raw,  each (245 mg).  Banana, 1 medium (425 mg).  Beans, lima, or baked beans, canned,  cup (280 mg).  Beans, white, canned,  cup (595 mg).  Beef roast, 3 oz (320 mg).  Beef, ground, 3 oz (270 mg).  Beets, raw or cooked,  cup (260 mg).  Bran muffin, 2 oz (300 mg).  Broccoli,  cup (230 mg).  Brussels sprouts,  cup (250 mg).  Cantaloupe,  cup (215 mg).  Cereal, 100% bran,  cup (200-400 mg).  Cheeseburger, single, fast food, 1 each (225-400 mg).  Chicken, 3 oz (220 mg).  Clams, canned, 3 oz (535 mg).  Crab, 3 oz (225 mg).  Dates, 5 each (270 mg).  Dried beans and peas,  cup (300-475 mg).  Figs, dried, 2 each (260 mg).  Fish: halibut, tuna, cod, snapper, 3 oz (480 mg).  Fish: salmon, haddock, swordfish, perch, 3 oz (300 mg).  Fish, tuna, canned 3 oz (200 mg).  [place] fries, fast food, 3 oz (470 mg).  Granola with fruit and nuts,  cup (200 mg).  Grapefruit juice,  cup (200 mg).  Greens, beet,  cup (655 mg).  Honeydew melon,  cup (200 mg).  Kale, raw, 1 cup (300 mg).  Kiwi, 1 medium (240 mg).  Kohlrabi, rutabaga, parsnips,  cup (280 mg).  Lentils,  cup (365 mg).  Mango, 1 each (325 mg).  Milk, chocolate, 1 cup (420 mg).  Milk: nonfat, low-fat, whole, buttermilk, 1 cup (350-380 mg).  Molasses, 1 Tbsp (295 mg).  Mushrooms,  cup (280) mg.  Nectarine, 1 each (275 mg).  Nuts: almonds, peanuts, hazelnuts, [place], cashew, mixed, 1 oz (200 mg).  Nuts, pistachios, 1 oz (295 mg).  Orange, 1 each (240 mg).  Orange juice,  cup (235 mg).  Papaya, medium,  fruit (390 mg).  Peanut butter, chunky, 2 Tbsp (240 mg).  Peanut butter, smooth, 2 Tbsp (210 mg).  Pear, 1 medium (200 mg).  Pomegranate, 1 whole (400 mg).  Pomegranate juice,  cup (215  mg).  Pork, 3 oz (350 mg).  Potato chips, salted, 1 oz (465 mg).  Potato, baked with skin, 1 medium (925 mg).  Potatoes, boiled,  cup (255 mg).  Potatoes, mashed,  cup (330 mg).  Prune juice,  cup (370 mg).  Prunes, 5 each (305 mg).  Pudding, chocolate,  cup (230 mg).  Pumpkin, canned,  cup (250 mg).  Raisins, seedless,  cup (270 mg).  Seeds, sunflower or pumpkin, 1 oz (240 mg).  Soy milk, 1 cup (300 mg).  Spinach,  cup (420 mg).  Spinach, canned,  cup (370 mg).  Sweet potato, baked with skin, 1 medium (450 mg).  Swiss chard,  cup (480 mg).  Tomato or vegetable juice,  cup (275 mg).  Tomato sauce or puree,  cup (400-550 mg).  Tomato, raw, 1 medium (290 mg).  Tomatoes, canned,  cup (200-300 mg).  [place], 3 oz (250 mg).  Wheat germ, 1 oz (250 mg).  Winter squash,  cup (250 mg).  Yogurt,  plain or fruited, 6 oz (260-435 mg).  Zucchini,  cup (220 mg). MODERATE IN POTASSIUM The following foods and beverages have 50-200 mg of potassium per serving:  Apple, 1 each (150 mg).  Apple juice,  cup (150 mg).  Applesauce,  cup (90 mg).  Apricot nectar,  cup (140 mg).  Asparagus, small spears,  cup or 6 spears (155 mg).  Bagel, cinnamon raisin, 1 each (130 mg).  Bagel, egg or plain, 4 in., 1 each (70 mg).  Beans, green,  cup (90 mg).  Beans, yellow,  cup (190 mg).  Beer, regular, 12 oz (100 mg).  Beets, canned,  cup (125 mg).  Blackberries,  cup (115 mg).  Blueberries,  cup (60 mg).  Bread, whole wheat, 1 slice (70 mg).  Broccoli, raw,  cup (145 mg).  Cabbage,  cup (150 mg).  Carrots, cooked or raw,  cup (180 mg).  Cauliflower, raw,  cup (150 mg).  Celery, raw,  cup (155 mg).  Cereal, bran flakes, cup (120-150 mg).  Cheese, cottage,  cup (110 mg).  Cherries, 10 each (150 mg).  Chocolate, 1 oz bar (165 mg).  Coffee, brewed 6 oz (90 mg).  Corn,  cup or 1 ear (195 mg).  Cucumbers,  cup (80  mg).  Egg, large, 1 each (60 mg).  Eggplant,  cup (60 mg).  Endive, raw, cup (80 mg).  English muffin, 1 each (65 mg).  Fish, orange roughy, 3 oz (150 mg).  Frankfurter, beef or pork, 1 each (75 mg).  Fruit cocktail,  cup (115 mg).  Grape juice,  cup (170 mg).  Grapefruit,  fruit (175 mg).  Grapes,  cup (155 mg).  Greens: kale, turnip, collard,  cup (110-150 mg).  Ice cream or frozen yogurt, chocolate,  cup (175 mg).  Ice cream or frozen yogurt, vanilla,  cup (120-150 mg).  Lemons, limes, 1 each (80 mg).  Lettuce, all types, 1 cup (100 mg).  Mixed vegetables,  cup (150 mg).  Mushrooms, raw,  cup (110 mg).  Nuts: walnuts, pecans, or macadamia, 1 oz (125 mg).  Oatmeal,  cup (80 mg).  Okra,  cup (110 mg).  Onions, raw,  cup (120 mg).  Peach, 1 each (185 mg).  Peaches, canned,  cup (120 mg).  Pears, canned,  cup (120 mg).  Peas, green, frozen,  cup (90 mg).  Peppers, green,  cup (130 mg).  Peppers, red,  cup (160 mg).  Pineapple juice,  cup (165 mg).  Pineapple, fresh or canned,  cup (100 mg).  Plums, 1 each (105 mg).  Pudding, vanilla,  cup (150 mg).  Raspberries,  cup (90 mg).  Rhubarb,  cup (115 mg).  Rice, wild,  cup (80 mg).  Shrimp, 3 oz (155 mg).  Spinach, raw, 1 cup (170 mg).  Strawberries,  cup (125 mg).  Summer squash  cup (175-200 mg).  Swiss chard, raw, 1 cup (135 mg).  Tangerines, 1 each (140 mg).  Tea, brewed, 6 oz (65 mg).  Turnips,  cup (140 mg).  Watermelon,  cup (85 mg).  Wine, red, table, 5 oz (180 mg).  Wine, white, table, 5 oz (100 mg). LOW IN POTASSIUM The following foods and beverages have less than 50 mg of potassium per serving.  Bread, white, 1 slice (30 mg).  Carbonated beverages, 12 oz (less than 5 mg).  Cheese, 1 oz (20-30 mg).  Cranberries,  cup (45 mg).  Cranberry juice cocktail,  cup (20 mg).  Fats and oils, 1 Tbsp (  less than 5 mg).  Hummus, 1 Tbsp (32  mg).  Nectar: papaya, mango, or pear,  cup (35 mg).  Rice, white or brown,  cup (50 mg).  Spaghetti or macaroni,  cup cooked (30 mg).  Tortilla, flour or corn, 1 each (50 mg).  Waffle, 4 in., 1 each (50 mg).  Water chestnuts,  cup (40 mg).   This information is not intended to replace advice given to you by your health care provider. Make sure you discuss any questions you have with your health care provider.   Document Released: 01/09/2005 Document Revised: 06/02/2013 Document Reviewed: 04/24/2013 Elsevier Interactive Patient Education [employer].

## 2015-09-05 NOTE — ED Provider Notes (Signed)
[facility] Emergency Department Provider Note  ____________________________________________  Time seen: Approximately 10:05 PM  I have reviewed the triage vital signs and the nursing notes.   HISTORY  Chief Complaint Weakness    HPI Howard Murphy is a 60 y.o. male sent from PCP for evaluation of hypoK+.  Saw pain management MD for first time today and had blood work revealing K+ 2.9.  PCP (Dr. Keith Rake) was called who advised the patient to come to the ED, have the K+ rechecked, repleted, and said he would see the patient in clinic tomorrow.  Patient has had moderate generalized weakness and fatigue for several days, but no other symptoms.  Specifically denies fever/chills, CP, SOB, abd pain, N/V/D.  Severity of hypoK+ is moderate.  While he has no GI symptoms, he does take HCTZ.  No prior hx of low K+.   Past Medical History  Diagnosis Date  . Diabetes mellitus without complication ([place])   . Hypertension   . GERD (gastroesophageal reflux disease)   . Hyperlipemia   . Nephrolithiasis   . Shortness of breath dyspnea   . 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  . Back ache 05/05/2013    Patient Active Problem List   Diagnosis Date Noted  . Chronic pain 09/05/2015  . Opiate use (30 MME/Day) 09/05/2015  . Long term prescription opiate use 09/05/2015  . Long term current use of opiate analgesic 09/05/2015  . Chronic low back pain 09/05/2015  . Lumbar spondylosis 09/05/2015  . Lumbar facet syndrome (Bilateral) (L>R) 09/05/2015  . Chronic hip pain (Left) 09/05/2015  . Chronic sacroiliac joint pain (Left) 09/05/2015  . Diabetic peripheral neuropathy ([place]) 09/05/2015  . Chronic lumbar radicular pain (S1 Dermatome) (Bilateral) (R>L) 09/05/2015  . Hypokalemia 09/05/2015  . Chronic shoulder pain (Location of Primary Source of Pain) (Bilateral) (L>R) 09/05/2015  . Non-insulin dependent type 2 diabetes mellitus ([place]) 09/05/2015  . Chronic foot pain (Location  of Secondary source of pain) (Bilateral) (L>R) 09/05/2015  . Chronic hand pain (Location of Tertiary source of pain) (Bilateral) (R>L) 09/05/2015  . Lumbar facet hypertrophy 09/05/2015  . Encounter for therapeutic drug level monitoring 09/05/2015  . Encounter for pain management planning 09/05/2015  . Low testosterone 09/05/2015  . Occipital pain (Right) 09/05/2015  . 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 Hypertriglyceridemia 07/26/2015  . Chronic shoulder pain (Right) 07/26/2015  . Dyspnea 07/14/2015  . Chronic abdominal pain (RUQ) 07/14/2015  . Type 2 diabetes mellitus with peripheral neuropathy ([place]) 07/14/2015  . Chest pain 07/14/2015  . Complete tear of the shoulder rotator cuff (Left) 05/10/2015  . Breath shortness 03/22/2015  . Elevated hemoglobin ([place]) 07/07/2013  . 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 History of lumbar facet Synovial cyst, surgically removed. 05/29/2013    Class: History of  . 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 Acid reflux 05/05/2013  . HLD (hyperlipidemia) 05/05/2013  . BP (high blood pressure) 05/08/2011  . Hypercholesteremia 05/08/2011  . Arthritis or polyarthritis, rheumatoid ([place]) 05/08/2011  . Rheumatoid arthritis ([place]) 05/08/2011    Past Surgical History  Procedure Laterality Date  . Cholecystectomy  2000  . Back surgery  2015  . Shoulder surgery Right 2016  . Colonoscopy with propofol N/A 04/11/2015    Procedure: COLONOSCOPY WITH PROPOFOL;  Surgeon: Manya Silvas, MD;  Location: [facility] ENDOSCOPY;  Service: Endoscopy;  Laterality: N/A;  . Esophagogastroduodenoscopy (egd) with propofol N/A 04/11/2015    Procedure:  ESOPHAGOGASTRODUODENOSCOPY (EGD) WITH PROPOFOL;  Surgeon: Manya Silvas, MD;  Location: [facility] ENDOSCOPY;  Service: Endoscopy;  Laterality: N/A;    Current Outpatient Rx  Name  Route  Sig  Dispense  Refill  . chlorthalidone (HYGROTON) 50 MG tablet   Oral   Take 50 mg by mouth daily.          Marland Kitchen lisinopril (PRINIVIL,ZESTRIL) 20 MG tablet   Oral   Take 20 mg by mouth daily.         Marland Kitchen omeprazole (PRILOSEC) 40 MG capsule   Oral   Take 40 mg by mouth 2 (two) times daily.         . ondansetron (ZOFRAN-ODT) 4 MG disintegrating tablet   Oral   Take 4 mg by mouth every 8 (eight) hours as needed for nausea or vomiting.         . potassium chloride SA (KLOR-CON M20) 20 MEQ tablet   Oral   Take 1 tablet (20 mEq total) by mouth daily.   14 tablet   0   . rosuvastatin (CRESTOR) 20 MG tablet   Oral   Take 1 tablet (20 mg total) by mouth daily.   30 tablet   2   . sitaGLIPtin (JANUVIA) 50 MG tablet   Oral   Take 1 tablet (50 mg total) by mouth daily.   30 tablet   2   . sucralfate (CARAFATE) 1 G tablet   Oral   Take 1 tablet by mouth 4 (four) times daily.         Marland Kitchen testosterone cypionate (DEPOTESTOSTERONE CYPIONATE) 200 MG/ML injection   Intramuscular   Inject 200 mg into the muscle every 14 (fourteen) days.           Allergies Review of patient's allergies indicates no known allergies.  Family History  Problem Relation Age of Onset  . Bladder Cancer Neg Hx   . Prostate cancer Neg Hx   . Kidney cancer Neg Hx   . Cancer Mother     Breast CA  . Stroke Father   . Heart disease Father   . Cancer Sister   . Cancer Maternal Grandmother   . Cancer Sister     Social History Social History  Substance Use Topics  . Smoking status: Never Smoker   . Smokeless tobacco: Never Used  . Alcohol Use: No     Comment: occasional    Review of Systems Constitutional: No fever/chills.  Generalized fatigue. Eyes: No visual changes. ENT: No sore throat. Cardiovascular: Denies chest pain. Respiratory: Denies shortness of breath. Gastrointestinal: No abdominal pain.  No nausea, no vomiting.  No diarrhea.  No constipation. Genitourinary: Negative for dysuria. Musculoskeletal: Negative for back pain. Skin: Negative for rash. Neurological: Negative for  headaches, focal weakness or numbness.  10-point ROS otherwise negative.  ____________________________________________   PHYSICAL EXAM:  VITAL SIGNS: ED Triage Vitals  Enc Vitals Group     BP 09/05/15 2055 141/96 mmHg     Pulse Rate 09/05/15 2055 85     Resp 09/05/15 2055 18     Temp 09/05/15 2055 96.9 F (36.1 C)     Temp src --      SpO2 09/05/15 2055 94 %     Weight 09/05/15 2055 195 lb (88.451 kg)     Height 09/05/15 2055 5\' 7"  (1.702 m)     Head Cir --      Peak Flow --      Pain Score 09/05/15 2055  7     Pain Loc --      Pain Edu? --      Excl. in [place]? --     Constitutional: Alert and oriented. Well appearing and in no acute distress. Eyes: Conjunctivae are normal. PERRL. EOMI. Head: Atraumatic. Nose: No congestion/rhinnorhea. Mouth/Throat: Mucous membranes are moist.  Oropharynx non-erythematous. Neck: No stridor.  No meningeal signs.   Cardiovascular: Normal rate, regular rhythm. Good peripheral circulation. Grossly normal heart sounds.   Respiratory: Normal respiratory effort.  No retractions. Lungs CTAB. Gastrointestinal: Soft and nontender. No distention.  Musculoskeletal: No lower extremity tenderness nor edema. No gross deformities of extremities. Neurologic:  Normal speech and language. No gross focal neurologic deficits are appreciated.  Skin:  Skin is warm, dry and intact. No rash noted. Psychiatric: Mood and affect are normal. Speech and behavior are normal.  ____________________________________________   LABS (all labs ordered are listed, but only abnormal results are displayed)  Labs Reviewed  BASIC METABOLIC PANEL - Abnormal; Notable for the following:    Potassium 2.9 (*)    Chloride 100 (*)    Glucose, Bld 115 (*)    All other components within normal limits  MAGNESIUM   ____________________________________________  EKG  ED ECG REPORT I, Shandricka Monroy, the attending physician, personally viewed and interpreted this ECG.  Date:  09/05/2015 EKG Time: 21:06 Rate: 81 Rhythm: normal sinus rhythm QRS Axis: normal Intervals: normal ST/T Wave abnormalities: normal Conduction Disturbances: none Narrative Interpretation: unremarkable  ____________________________________________  RADIOLOGY  None obtained in ED  ____________________________________________   PROCEDURES  Procedure(s) performed: None  Critical Care performed: No ____________________________________________   INITIAL IMPRESSION / ASSESSMENT AND PLAN / ED COURSE  Pertinent labs & imaging results that were available during my care of the patient were reviewed by me and considered in my medical decision making (see chart for details).  Well appearing, NAD.  VSS.  EKG unremarkable.  Potassium low, but not dangerous.  Gave 40 meq KCl in ED w/ Rx for supplement.  Advised f/u w/ PCP tomorrow.  No emergent medical condition.  ____________________________________________  FINAL CLINICAL IMPRESSION(S) / ED DIAGNOSES  Final diagnoses:  Hypokalemia  Chronic low back pain  Chronic right shoulder pain  Essential hypertension      NEW MEDICATIONS STARTED DURING THIS VISIT:  New Prescriptions   POTASSIUM CHLORIDE SA (KLOR-CON M20) 20 MEQ TABLET    Take 1 tablet (20 mEq total) by mouth daily.      Note:  This document was prepared using Dragon voice recognition software and may include unintentional dictation errors.   Hinda Kehr, MD 09/05/15 2240

## 2015-09-05 NOTE — ED Notes (Signed)
Pharmacy called, pyxis out of stock at this time. Will send to ED momentarily.

## 2015-09-05 NOTE — Telephone Encounter (Signed)
Contacted patient after receiving lab results from patient's pain management physician Dr. Milinda Pointer. Potassium is 2.9, discussed with patient and his daughter that he needs to proceed to the ER, have his potassium rechecked and replenish if indicated and then he needs to follow up with me tomorrow morning. Patient and daughter verbalized agreement.

## 2015-09-05 NOTE — ED Notes (Signed)
Pt was called by pmd to come to the ED for low potassium had blood work done today.  Pt is new to the pain clinic and had routine blood work done.

## 2015-09-05 NOTE — Progress Notes (Signed)
Patient's Name: Howard Murphy MRN: [id number] DOB: 1956/02/21 DOS: 09/05/2015  Primary Reason(s) for Visit: Initial Patient Evaluation CC: Shoulder Pain; Leg Pain; Foot Pain; and Hand Pain   HPI  Howard Murphy is a 60 y.o. year old, male patient, who comes today for an initial evaluation. 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); Elevated hemoglobin ([place]); Acid reflux; BP (high blood pressure); Hypercholesteremia; HLD (hyperlipidemia); Arthritis or polyarthritis, rheumatoid ([place]); History of lumbar facet Synovial cyst, surgically removed.; Rheumatoid arthritis ([place]); Breath shortness; Dyspnea; Chronic abdominal pain (RUQ); Type 2 diabetes mellitus with peripheral neuropathy ([place]); Chest pain; Hypertriglyceridemia; Chronic shoulder pain (Right); Chronic pain; Opiate use (30 MME/Day); Long term prescription opiate use; Long term current use of opiate analgesic; Chronic low back pain; Lumbar spondylosis; Lumbar facet syndrome (Bilateral) (L>R); Chronic hip pain (Left); Chronic sacroiliac joint pain (Left); Diabetic peripheral neuropathy ([place]); 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 His primarily concern today is the Shoulder Pain; Leg Pain; Foot Pain; and Hand Pain   The patient comes in today clinics today for the first time for a chronic pain management evaluation. Records would suggest that I have seen this patient back  around 2010. According to the patient and his primary pain is that of the shoulders with the left being worst on the right. This is followed by his feet pain with the left being worst on the right. The patient has a history significant for diabetes with possible peripheral neuropathy. Following this is the pain in both hands with the right being worst on the left. He indicates being right-handed. Next is his low back pain with the left side being the primary source of his pain. Next he complains of headaches in the occipital region primarily in the right side. Following this is his lower extremity pain with the right being worst on the left. In the case of the right lower extremity the pain goes down to the bottom of his foot in what seems to be an S1 dermatomal distribution. He denies having any pain in the hip or knee area. In the case of the left lower extremity pain goes down to his calf with pain also going down to the bottom most of his foot in what appears to be an S1 dermatomal distribution. In this case the patient does complain of pain in the left hip area. Physical exam today was positive for left hip pain and left SI joint pain on the Patrick maneuver. The patient also had a positive lumbar facet provocative pain on hyperextension and rotation.  Reported Pain Score: 7 , clinically he looks like a 4-5/10. Reported level is inconsistent with clinical obrservations. Pain Type: Chronic pain Pain Location: Shoulder (legs, knees, feet and hands) Pain Orientation: Right, Left Pain Descriptors / Indicators:  (shoulders are stabbing, leg pain is pins and needles, feet is pins and needles, hand pain is aching, top of head is stabbing.) Pain Frequency: Constant  Onset and Duration: Gradual, Date of onset: Many years ago and Present longer than 3 months Cause  of pain: Unknown Severity: No change since onset, NAS-11 at its worse: 10/10, NAS-11 at its best: 10/10, NAS-11 now: 10/10 and NAS-11 on the  average: 10/10. Clearly the patient does not understand the pain scoring system. Timing: Morning, Noon, Afternoon, Evening, Night, Not influenced by the time of the day, During activity or exercise, After activity or exercise and After a period of immobility Aggravating Factors: Bending, Bowel movements, Climbing, Eating, Kneeling, Lifiting, Motion, Nerve blocks, Prolonged sitting, Prolonged standing, Squatting, Stooping , Surgery made it worse, Twisting, Walking, Walking uphill, Walking downhill and Working Alleviating Factors: Bending, Stretching, Lying down, Nerve blocks, Resting, Sitting, Sleeping, Standing, TENS and Warm showers or baths Associated Problems: Constipation, Day-time cramps, Night-time cramps, Depression, Dizziness, Fatigue, Impotence, Inability to control bowel, Nausea, Numbness, Personality changes, Sadness, Spasms, Sweating, Swelling, Temperature changes, Tingling, Vomiting , Weakness, Pain that wakes patient up and Pain that does not allow patient to sleep Quality of Pain: Agonizing, Burning, Deep, Disabling, Distressing, Dreadful, Dull, Exhausting, Getting longer, Heavy, Itching, Lancinating, Nagging, Numb, Pressure-like, Punishing, Sharp, Shooting, Sickening, Splitting, Stabbing, Throbbing, Tingling, Toothache-like and Uncomfortable Previous Examinations or Tests: CT scan, Endoscopy, MRI scan, Nerve block, Spinal tap, X-rays, Neurological evaluation, Neurosurgical evaluation and Orthoperdic evaluation Previous Treatments: Trigger point injections  Historic Controlled Substance Pharmacotherapy Review  Previously Prescribed Opioids:  Analgesic: Oxycodone/APAP 5/325 one every 6 hours (20 mg/day) MME/day: 30 mg/day Pharmacokinetics: Onset of action (Liberation/Absorption): Within expected pharmacological parameters. (One hour) Time to Peak effect (Distribution): Timing and results are as within normal expected parameters. (2 hours) Duration of action (Metabolism/Excretion):  Within normal limits for medication. (4-5 hours) Pharmacodynamics: Analgesic Effect: 50% Activity Facilitation: Medication(s) allow patient to sit, stand, walk, and do the basic ADLs Perceived Effectiveness: Described as relatively effective, allowing for increase in activities of daily living (ADL) Side-effects or Adverse reactions: None reported Historical Background Evaluation: [place] PDMP: Five (5) year initial data search conducted. No abnormal patterns identified [place] [employer] Offender Public Information: Non-contributory Historical Hospital-associated UDS Results:  No results found for: THCU, COCAINSCRNUR, PCPSCRNUR, MDMA, AMPHETMU, METHADONE, ETOH UDS Results: No UDS available, at this time UDS Interpretation: No UDS available, at this time Medication Assessment Form: Not applicable. Initial evaluation. The patient has not received any medications from our practice Treatment compliance: Not applicable. Initial evaluation Risk Assessment: Aberrant Behavior: None observed today Opioid Fatal Overdose Risk Factors: None detected today Substance Use Disorder (SUD) Risk Level: Pending results of Medical Psychology Evaluation for SUD Opioid Risk Tool (ORT) Score: Total Score: 0 Low Risk for SUD (Score <3) Depression Scale Score: PHQ-2: PHQ-2 Total Score: 0 No depression (0) PHQ-9: PHQ-9 Total Score: 0 No depression (0-4)  Pharmacologic Plan: Pending ordered tests and/or consults  Neuromodulation Therapy Review  Type: No neuromodulatory devices implanted Side-effects or Adverse reactions: No device reported Effectiveness: No device reported  Allergies  Howard Murphy has No Known Allergies.  Meds  The patient has a current medication list which includes the following prescription(s): chlorthalidone, lisinopril, omeprazole, ondansetron, rosuvastatin, sitagliptin, sucralfate, testosterone cypionate, and potassium chloride sa, and the following Facility-Administered  Medications: potassium chloride sa. Requested Prescriptions    No prescriptions requested or ordered in this encounter    ROS  Cardiovascular History: Daily Aspirin intake, Hypertension and Chest pain Pulmonary or Respiratory History: Shortness of breath and Snoring  Neurological History: Negative for epilepsy, stroke, urinary or fecal inontinence, spina bifida or tethered cord syndrome Psychological-Psychiatric History: Anxiety and Depression Gastrointestinal History: Pancreatitis and Constipation Genitourinary History: Nephrolithiasis Hematological History:  Negative for anticoagulant therapy, anemia, bruising or bleeding easily, hemophilia, sickle cell disease or trait, thrombocytopenia or coagulupathies Endocrine History: Non-insulin-dependent diabetes mellitus Rheumatologic History: Osteoarthritis Musculoskeletal History: Negative for myasthenia gravis, muscular dystrophy, multiple sclerosis or malignant hyperthermia Work History: Out of work due to pain  [place]  Medical:  Howard Murphy  has a past medical history of Diabetes mellitus without complication ([place]); Hypertension; GERD (gastroesophageal reflux disease); Hyperlipemia; Nephrolithiasis; Shortness of breath dyspnea; Arthritis; and Back ache (05/05/2013). Family: family history includes Cancer in his maternal grandmother, mother, sister, and sister; Heart disease in his father; Stroke in his father. There is no history of Bladder Cancer, Prostate cancer, or Kidney cancer. Surgical:  has past surgical history that includes Cholecystectomy (2000); Back surgery (2015); Shoulder surgery (Right, 2016); Colonoscopy with propofol (N/A, 04/11/2015); and Esophagogastroduodenoscopy (egd) with propofol (N/A, 04/11/2015). Tobacco:  reports that he has never smoked. He has never used smokeless tobacco. Alcohol:  reports that he does not drink alcohol. Drug:  reports that he does not use illicit drugs.  Physical Exam  Vitals:  Today's Vitals    09/05/15 1302 09/05/15 1305  BP: 155/104   Pulse: 73   Temp: 98.5 F (36.9 C)   Resp: 16   Height: 5\' 7"  (1.702 m)   Weight: 195 lb (88.451 kg)   SpO2: 97%   PainSc: 7  7   PainLoc: Shoulder     Calculated BMI: Body mass index is 30.53 kg/(m^2). Obese (Class I) (30-34.9 kg/m2) - 68% higher incidence of chronic pain  General appearance: alert, cooperative, appears stated age and mild distress Eyes: PERLA Respiratory: No evidence respiratory distress, no audible rales or ronchi and no use of accessory muscles of respiration  Cervical Spine Inspection: Normal anatomy Alignment: Symetrical ROM: Adequate Palpation: WNL Provocative Tests: Spurling's Foraminal Stenosis Test: deferred Hoffman's Cervical Myelopathy Test: deferred Lhermitte Sign (Cord Compression/Myelopathy Test):  deferred Occipital Nerve Palpation: deferred  Upper Extremities Inspection: No gross anomalies detected ROM: Adequate Sensory: Normal Motor: Unremarkable Pulses: Palpable DTR:  Biceps (C5): WNL Brachioradialis (C6): WNL Triceps (C7): WNL  Thoracic Spine Inspection: No gross anomalies detected Alignment: Symetrical ROM: Adequate Palpation: WNL  Lumbar Spine Inspection: No gross anomalies detected Alignment: Symetrical ROM: Decreased Palpation: Tender Provocative Tests: Lumbar Hyperextension and rotation test: Positive bilaterally Patrick's Maneuver: Positive on the left side for hip pain and left SI joint pain. Gait: Antalgic (limping)  Lower Extremities Inspection: No gross anomalies detected ROM: Adequate Sensory: Normal Motor: Unremarkable  Toe walk (S1): WNL  Heal walk (L5): WNL  Assessment  Primary Diagnosis & Pertinent Problem List: The primary encounter diagnosis was Chronic pain. Diagnoses of Opiate use, Long term prescription opiate use, Long term current use of opiate analgesic, Chronic low back pain, Lumbar spondylosis, unspecified spinal osteoarthritis, Facet syndrome,  lumbar, Chronic left hip pain, Chronic left sacroiliac joint pain, Diabetic peripheral neuropathy (HCC), Chronic radicular lumbar pain, Hypokalemia, Chronic shoulder pain (Location of Primary Source of Pain) (Bilateral) (L>R), Non-insulin dependent type 2 diabetes mellitus ([place]), Chronic foot pain, unspecified laterality, Chronic hand pain, unspecified laterality, Lumbar facet hypertrophy, Encounter for therapeutic drug level monitoring, Encounter for pain management planning, Low testosterone, and Occipital pain (Right) were also pertinent to this visit.  Visit Diagnosis: 1. Chronic pain   2. Opiate use   3. Long term prescription opiate use   4. Long term current use of opiate analgesic   5. Chronic low back pain   6. Lumbar spondylosis, unspecified spinal osteoarthritis   7. Facet  syndrome, lumbar   8. Chronic left hip pain   9. Chronic left sacroiliac joint pain   10. Diabetic peripheral neuropathy ([place])   11. Chronic radicular lumbar pain   12. Hypokalemia   13. Chronic shoulder pain (Location of Primary Source of Pain) (Bilateral) (L>R)   14. Non-insulin dependent type 2 diabetes mellitus ([place])   15. Chronic foot pain, unspecified laterality   16. Chronic hand pain, unspecified laterality   17. Lumbar facet hypertrophy   18. Encounter for therapeutic drug level monitoring   19. Encounter for pain management planning   20. Low testosterone   21. Occipital pain (Right)     Assessment: Chronic foot pain (Location of Secondary source of pain) (Bilateral) (L>R) This may be either secondary to the patient's diabetic peripheral neuropathy and/or bilateral lumbosacral radiculitis affecting the S1 dermatome.    Plan of Care  Note: As per protocol, today's visit has been an evaluation only. We have not taken over the patient's controlled substance management.  Pharmacotherapy (Medications Ordered): No orders of the defined types were placed in this encounter.    Lab-work &  Procedure Ordered: Orders Placed This Encounter  Procedures  . DG Si Joints    Standing Status: Future     Number of Occurrences:      Standing Expiration Date: 09/04/2016    Order Specific Question:  Reason for Exam (SYMPTOM  OR DIAGNOSIS REQUIRED)    Answer:  Sacroiliac joint pain    Order Specific Question:  Preferred imaging location?    Answer:  [facility]    Order Specific Question:  Call Results- Best Contact Number?    Answer:  [id number] (Pain Clinic facility) (Dr. Dossie Arbour)  . DG Lumbar Spine Complete W/Bend    Standing Status: Future     Number of Occurrences: 1     Standing Expiration Date: 09/04/2016    Scheduling Instructions:     Please include flexion and extension views and report any spinal instability (>4 mm displacement of any spondylolisthesis). If present, please report any spondylolisthesis grade, as well as displacement in millimeters.    Order Specific Question:  Reason for Exam (SYMPTOM  OR DIAGNOSIS REQUIRED)    Answer:  Sacroiliac joint pain    Order Specific Question:  Preferred imaging location?    Answer:  [facility]    Order Specific Question:  Call Results- Best Contact Number?    Answer:  [id number] (Pain Clinic facility) (Dr. Dossie Arbour)  . DG HIP UNILAT W OR W/O PELVIS 2-3 VIEWS LEFT    Standing Status: Future     Number of Occurrences: 1     Standing Expiration Date: 09/04/2016    Order Specific Question:  Reason for Exam (SYMPTOM  OR DIAGNOSIS REQUIRED)    Answer:  Sacroiliac joint pain    Order Specific Question:  Preferred imaging location?    Answer:  [facility]    Order Specific Question:  Call Results- Best Contact Number?    Answer:  [id number] (Pain Clinic facility) (Dr. Dossie Arbour)  . Compliance Drug Analysis, Ur    Volume: 30 ml(s). Minimum 3 ml of urine is needed. Document temperature of fresh sample. Indications: Long term (current) use of opiate analgesic (Z79.891) Test#: [id number] (Comprehensive Profile)  .  Comprehensive metabolic panel    Standing Status: Future     Number of Occurrences:      Standing Expiration Date: 10/05/2015    Order Specific Question:  Has the patient fasted?  Answer:  No  . C-reactive protein    Standing Status: Future     Number of Occurrences:      Standing Expiration Date: 10/05/2015  . Magnesium    Standing Status: Future     Number of Occurrences:      Standing Expiration Date: 10/05/2015  . Sedimentation rate    Standing Status: Future     Number of Occurrences:      Standing Expiration Date: 10/05/2015  . Ambulatory referral to Psychology    Referral Priority:  Routine    Referral Type:  Psychiatric    Referral Reason:  Specialty Services Required    Referred to Provider:  Beckey Rutter, PHD    Requested Specialty:  Psychology    Number of Visits Requested:  1  . NCV with EMG(electromyography)    Bilateral testing requested.    Standing Status: Future     Number of Occurrences:      Standing Expiration Date: 09/04/2016    Scheduling Instructions:     Please refer this patient to [facility] for Nerve Conduction testing of the lower extremities. (EMG & PNCV)    Order Specific Question:  Where should this test be performed?    Answer:  Other    Imaging Ordered: AMB REFERRAL TO PSYCHOLOGY DG SI JOINTS  Interventional Therapies: Scheduled: None at this time. PRN Procedures: Diagnostic bilateral lumbar facet block under fluoroscopic guidance and IV sedation.    Referral(s) or Consult(s):  1. Medical psychology consult for substance use disorder evaluation. 2. Neurology consult for bilateral lower extremity EMG/PNCV to evaluate for diabetic peripheral neuropathy and/or bilateral lumbar radiculopathy/radiculitis following the S1 dermatomal distribution.  Medications administered during this visit: Howard Murphy had no medications administered during this visit.  Prescriptions ordered during this visit: New Prescriptions    POTASSIUM CHLORIDE SA (KLOR-CON M20) 20 MEQ TABLET    Take 1 tablet (20 mEq total) by mouth daily.    Future Appointments Date Time Provider [place]  09/06/2015 11:40 AM Roselee Nova, MD [place] None  09/12/2015 1:30 PM Nori Riis, PA-C [facility] None  06/19/2016 11:00 AM Nori Riis, PA-C [facility] None    Primary Care Physician: Keith Rake, MD Location: [facility] Facility Note by: Kathlen Brunswick. Dossie Arbour, M.D, DABA, DABAPM, DABPM, DABIPP, FIPP

## 2015-09-06 ENCOUNTER — Ambulatory Visit (INDEPENDENT_AMBULATORY_CARE_PROVIDER_SITE_OTHER): Payer: Medicare Other | Admitting: Family Medicine

## 2015-09-06 VITALS — BP 140/88 | HR 87 | Temp 98.6°F | Resp 14 | Wt 189.0 lb

## 2015-09-06 DIAGNOSIS — T502X5A Adverse effect of carbonic-anhydrase inhibitors, benzothiadiazides and other diuretics, initial encounter: Secondary | ICD-10-CM | POA: Diagnosis not present

## 2015-09-06 DIAGNOSIS — I10 Essential (primary) hypertension: Secondary | ICD-10-CM | POA: Diagnosis not present

## 2015-09-06 DIAGNOSIS — M545 Low back pain: Secondary | ICD-10-CM

## 2015-09-06 DIAGNOSIS — G8929 Other chronic pain: Secondary | ICD-10-CM | POA: Diagnosis not present

## 2015-09-06 DIAGNOSIS — E876 Hypokalemia: Secondary | ICD-10-CM

## 2015-09-06 DIAGNOSIS — M5442 Lumbago with sciatica, left side: Secondary | ICD-10-CM

## 2015-09-06 LAB — C-REACTIVE PROTEIN: CRP: 0.6 mg/dL (ref <1.0)

## 2015-09-06 MED ORDER — AMLODIPINE BESYLATE 5 MG PO TABS: 5.0000 mg | ORAL_TABLET | Freq: Every day | ORAL | Status: DC

## 2015-09-06 MED ORDER — OXYCODONE-ACETAMINOPHEN 5-325 MG PO TABS: 1.0000 | ORAL_TABLET | Freq: Three times a day (TID) | ORAL | Status: DC | PRN

## 2015-09-06 NOTE — Progress Notes (Signed)
Name: Howard Murphy   MRN: [id number]    DOB: July 19, 1955   Date:09/06/2015       Progress Note  Subjective  Chief Complaint  Chief Complaint  Patient presents with  . Abnormal Lab    Low potassium, went to ER last night.  Syptoms include: SOB, fatigue and weakness    HPI  Hypokalemia: Patient presents after ER follow-up, potassium was 2.9, was sent to the ER, where he was given oral potassium in supplementation. Presents to recheck levels. Denies any chest pain, palpitations, headaches, other symptoms with  Chronic Low Back Pain: On a clear back pain, being followed by pain management. Patient has seen pain management clinic and Dr. Dossie Arbour is in the process of obtaining laboratory, imaging, and other assessments prior to starting therapy. Patient requesting medication for chronic low back pain for relief until his next appointment with Dr. Dossie Arbour.  Past Medical History  Diagnosis Date  . Diabetes mellitus without complication ([place])   . Hypertension   . GERD (gastroesophageal reflux disease)   . Hyperlipemia   . Nephrolithiasis   . Shortness of breath dyspnea   . 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  . Back ache 05/05/2013    Past Surgical History  Procedure Laterality Date  . Cholecystectomy  2000  . Back surgery  2015  . Shoulder surgery Right 2016  . Colonoscopy with propofol N/A 04/11/2015    Procedure: COLONOSCOPY WITH PROPOFOL;  Surgeon: Manya Silvas, MD;  Location: [facility] ENDOSCOPY;  Service: Endoscopy;  Laterality: N/A;  . Esophagogastroduodenoscopy (egd) with propofol N/A 04/11/2015    Procedure: ESOPHAGOGASTRODUODENOSCOPY (EGD) WITH PROPOFOL;  Surgeon: Manya Silvas, MD;  Location: [facility] ENDOSCOPY;  Service: Endoscopy;  Laterality: N/A;    Family History  Problem Relation Age of Onset  . Bladder Cancer Neg Hx   . Prostate cancer Neg Hx   . Kidney cancer Neg Hx   . Cancer Mother     Breast CA  . Stroke Father   . Heart disease Father   . Cancer Sister    . Cancer Maternal Grandmother   . Cancer Sister     Social History   Social History  . Marital Status: Single    Spouse Name: N/A  . Number of Children: N/A  . Years of Education: N/A   Occupational History  . Not on file.   Social History Main Topics  . Smoking status: Never Smoker   . Smokeless tobacco: Never Used  . Alcohol Use: No     Comment: occasional  . Drug Use: No  . Sexual Activity: Yes   Other Topics Concern  . Not on file   Social History Narrative     Current outpatient prescriptions:  .  lisinopril (PRINIVIL,ZESTRIL) 20 MG tablet, Take 20 mg by mouth daily., Disp: , Rfl:  .  omeprazole (PRILOSEC) 40 MG capsule, Take 40 mg by mouth 2 (two) times daily., Disp: , Rfl:  .  ondansetron (ZOFRAN-ODT) 4 MG disintegrating tablet, Take 4 mg by mouth every 8 (eight) hours as needed for nausea or vomiting., Disp: , Rfl:  .  potassium chloride SA (KLOR-CON M20) 20 MEQ tablet, Take 1 tablet (20 mEq total) by mouth daily., Disp: 14 tablet, Rfl: 0 .  rosuvastatin (CRESTOR) 20 MG tablet, Take 1 tablet (20 mg total) by mouth daily., Disp: 30 tablet, Rfl: 2 .  sitaGLIPtin (JANUVIA) 50 MG tablet, Take 1 tablet (50 mg total) by mouth daily., Disp: 30  tablet, Rfl: 2 .  sucralfate (CARAFATE) 1 G tablet, Take 1 tablet by mouth 4 (four) times daily., Disp: , Rfl:  .  testosterone cypionate (DEPOTESTOSTERONE CYPIONATE) 200 MG/ML injection, Inject 200 mg into the muscle every 14 (fourteen) days., Disp: , Rfl:   No Known Allergies   Review of Systems  Constitutional: Negative for fever and chills.  Respiratory: Negative for shortness of breath.   Cardiovascular: Negative for chest pain and palpitations.      Objective  Filed Vitals:   09/06/15 1130  BP: 140/88  Pulse: 87  Temp: 98.6 F (37 C)  TempSrc: Oral  Resp: 14  Weight: 189 lb (85.73 kg)  SpO2: 95%    Physical Exam  Constitutional: He is well-developed, well-nourished, and in no distress.  HENT:  Head:  Normocephalic and atraumatic.  Cardiovascular: Normal rate and regular rhythm.   Pulmonary/Chest: Effort normal and breath sounds normal.  Musculoskeletal:       Lumbar back: He exhibits tenderness, pain and spasm.  Nursing note and vitals reviewed.      Recent Results (from the past 2160 hour(s))  BLADDER SCAN AMB NON-IMAGING     Status: None   Collection Time: 06/14/15 10:21 AM  Result Value Ref Range   Scan Result 26 mL   Urinalysis, Complete     Status: Abnormal   Collection Time: 06/14/15 10:38 AM  Result Value Ref Range   Specific Gravity, UA >1.030 (H) 1.005 - 1.030   pH, UA 5.5 5.0 - 7.5   Color, UA Yellow Yellow   Appearance Ur Clear Clear   Leukocytes, UA Negative Negative   Protein, UA Trace (A) Negative/Trace   Glucose, UA Negative Negative   Ketones, UA Negative Negative   RBC, UA Negative Negative   Bilirubin, UA Negative Negative   Urobilinogen, Ur 0.2 0.2 - 1.0 mg/dL   Nitrite, UA Negative Negative   Microscopic Examination See below:   Microscopic Examination     Status: Abnormal   Collection Time: 06/14/15 10:38 AM  Result Value Ref Range   WBC, UA None seen 0 -  5 /hpf   RBC, UA 0-2 0 -  2 /hpf   Epithelial Cells (non renal) None seen 0 - 10 /hpf   Mucus, UA Present (A) Not Estab.   Bacteria, UA Few (A) None seen/Few  Basic metabolic panel     Status: Abnormal   Collection Time: 06/14/15 10:39 AM  Result Value Ref Range   Glucose 147 (H) 65 - 99 mg/dL   BUN 15 6 - 24 mg/dL   Creatinine, Ser 1.00 0.76 - 1.27 mg/dL   GFR calc non Af Amer 82 >59 mL/min/1.73   GFR calc Af Amer 95 >59 mL/min/1.73   BUN/Creatinine Ratio 15 9 - 20   Sodium 142 134 - 144 mmol/L   Potassium 4.2 3.5 - 5.2 mmol/L   Chloride 98 96 - 106 mmol/L   CO2 28 18 - 29 mmol/L   Calcium 9.6 8.7 - 10.2 mg/dL  PSA Total (Reflex To Free)     Status: None   Collection Time: 06/14/15 11:15 AM  Result Value Ref Range   Prostate Specific Ag, Serum 2.9 0.0 - 4.0 ng/mL    Comment: Roche  ECLIA methodology. According to the American Urological Association, Serum PSA should decrease and remain at undetectable levels after radical prostatectomy. The AUA defines biochemical recurrence as an initial PSA value 0.2 ng/mL or greater followed by a subsequent confirmatory PSA value 0.2 ng/mL  or greater. Values obtained with different assay methods or kits cannot be used interchangeably. Results cannot be interpreted as absolute evidence of the presence or absence of malignant disease.    Reflex Criteria Comment     Comment: The percent free PSA is performed on a reflex basis only when the total PSA is between 4.0 and 10.0 ng/mL.   POCT HgB A1C     Status: Abnormal   Collection Time: 07/14/15 12:37 PM  Result Value Ref Range   Hemoglobin A1C 7.1   Lipid Profile     Status: Abnormal   Collection Time: 07/15/15 11:13 AM  Result Value Ref Range   Cholesterol, Total 174 100 - 199 mg/dL   Triglycerides 232 (H) 0 - 149 mg/dL   HDL 26 (L) >39 mg/dL   VLDL Cholesterol Cal 46 (H) 5 - 40 mg/dL   LDL Calculated 102 (H) 0 - 99 mg/dL   Chol/HDL Ratio 6.7 (H) 0.0 - 5.0 ratio units    Comment:                                   T. Chol/HDL Ratio                                             Men  Women                               1/2 Avg.Risk  3.4    3.3                                   Avg.Risk  5.0    4.4                                2X Avg.Risk  9.6    7.1                                3X Avg.Risk 23.4   11.0   CBC with Differential     Status: None   Collection Time: 07/15/15 11:13 AM  Result Value Ref Range   WBC 9.2 3.4 - 10.8 x10E3/uL   RBC 5.71 4.14 - 5.80 x10E6/uL   Hemoglobin 16.9 12.6 - 17.7 g/dL   Hematocrit 49.4 37.5 - 51.0 %   MCV 87 79 - 97 fL   MCH 29.6 26.6 - 33.0 pg   MCHC 34.2 31.5 - 35.7 g/dL   RDW 14.0 12.3 - 15.4 %   Platelets 300 150 - 379 x10E3/uL   Neutrophils 59 %   Lymphs 31 %   Monocytes 8 %   Eos 2 %   Basos 0 %   Neutrophils Absolute 5.4 1.4 -  7.0 x10E3/uL   Lymphocytes Absolute 2.9 0.7 - 3.1 x10E3/uL   Monocytes Absolute 0.7 0.1 - 0.9 x10E3/uL   EOS (ABSOLUTE) 0.2 0.0 - 0.4 x10E3/uL   Basophils Absolute 0.0 0.0 - 0.2 x10E3/uL   Immature Granulocytes 0 %   Immature Grans (Abs) 0.0 0.0 - 0.1 x10E3/uL  Comprehensive Metabolic Panel (CMET)  Status: Abnormal   Collection Time: 07/15/15 11:13 AM  Result Value Ref Range   Glucose 147 (H) 65 - 99 mg/dL   BUN 13 6 - 24 mg/dL   Creatinine, Ser 1.10 0.76 - 1.27 mg/dL   GFR calc non Af Amer 73 >59 mL/min/1.73   GFR calc Af Amer 84 >59 mL/min/1.73   BUN/Creatinine Ratio 12 9 - 20   Sodium 139 134 - 144 mmol/L   Potassium 3.8 3.5 - 5.2 mmol/L   Chloride 95 (L) 96 - 106 mmol/L   CO2 25 18 - 29 mmol/L   Calcium 9.7 8.7 - 10.2 mg/dL   Total Protein 6.6 6.0 - 8.5 g/dL   Albumin 4.0 3.5 - 5.5 g/dL   Globulin, Total 2.6 1.5 - 4.5 g/dL   Albumin/Globulin Ratio 1.5 1.1 - 2.5   Bilirubin Total 0.6 0.0 - 1.2 mg/dL   Alkaline Phosphatase 48 39 - 117 IU/L   AST 33 0 - 40 IU/L   ALT 41 0 - 44 IU/L  Comprehensive metabolic panel     Status: Abnormal   Collection Time: 09/05/15  3:43 PM  Result Value Ref Range   Sodium 135 135 - 145 mmol/L   Potassium 2.9 (LL) 3.5 - 5.1 mmol/L    Comment: CRITICAL RESULT CALLED TO, READ BACK BY AND VERIFIED WITH  DR. Dossie Arbour  AT 1622 09/05/15 SDR    Chloride 100 (L) 101 - 111 mmol/L   CO2 23 22 - 32 mmol/L   Glucose, Bld 104 (H) 65 - 99 mg/dL   BUN 13 6 - 20 mg/dL   Creatinine, Ser 1.02 0.61 - 1.24 mg/dL   Calcium 9.5 8.9 - 10.3 mg/dL   Total Protein 8.0 6.5 - 8.1 g/dL   Albumin 4.6 3.5 - 5.0 g/dL   AST 38 15 - 41 U/L   ALT 46 17 - 63 U/L   Alkaline Phosphatase 40 38 - 126 U/L   Total Bilirubin 0.9 0.3 - 1.2 mg/dL   GFR calc non Af Amer >60 >60 mL/min   GFR calc Af Amer >60 >60 mL/min    Comment: (NOTE) The eGFR has been calculated using the CKD EPI equation. This calculation has not been validated in all clinical situations. eGFR's  persistently <60 mL/min signify possible Chronic Kidney Disease.    Anion gap 12 5 - 15  Magnesium     Status: None   Collection Time: 09/05/15  3:43 PM  Result Value Ref Range   Magnesium 1.9 1.7 - 2.4 mg/dL  Sedimentation rate     Status: None   Collection Time: 09/05/15  3:43 PM  Result Value Ref Range   Sed Rate 1 0 - 20 mm/hr  Basic metabolic panel     Status: Abnormal   Collection Time: 09/05/15  9:14 PM  Result Value Ref Range   Sodium 135 135 - 145 mmol/L   Potassium 2.9 (LL) 3.5 - 5.1 mmol/L    Comment: CRITICAL RESULT CALLED TO, READ BACK BY AND VERIFIED WITH LAUREN CLOUDEN AT 2143 09/05/15 MLZ    Chloride 100 (L) 101 - 111 mmol/L   CO2 28 22 - 32 mmol/L   Glucose, Bld 115 (H) 65 - 99 mg/dL   BUN 14 6 - 20 mg/dL   Creatinine, Ser 1.05 0.61 - 1.24 mg/dL   Calcium 9.6 8.9 - 10.3 mg/dL   GFR calc non Af Amer >60 >60 mL/min   GFR calc Af Amer >60 >60 mL/min  Comment: (NOTE) The eGFR has been calculated using the CKD EPI equation. This calculation has not been validated in all clinical situations. eGFR's persistently <60 mL/min signify possible Chronic Kidney Disease.    Anion gap 7 5 - 15  Magnesium     Status: None   Collection Time: 09/05/15  9:14 PM  Result Value Ref Range   Magnesium 2.1 1.7 - 2.4 mg/dL     Assessment & Plan  1. Diuretic-induced hypokalemia  - Basic Metabolic Panel (BMET)  2. Essential hypertension DC Chlorthalidone and replaced with amlodipine 5 mg daily for hypertension. Follow-up with blood pressure management in one month - amLODipine (NORVASC) 5 MG tablet; Take 1 tablet (5 mg total) by mouth daily.  Dispense: 90 tablet; Refill: 3  3. Chronic left-sided low back pain with left-sided sciatica Pain management notes reviewed and discussed with patient in detail. Provided Percocet 5 mg 1 every 8 hours as needed. Patient will follow-up with pain management. - oxyCODONE-acetaminophen (ROXICET) 5-325 MG tablet; Take 1 tablet by mouth  every 8 (eight) hours as needed for severe pain.  Dispense: 30 tablet; Refill: 0    Eviana Sibilia [facility] 09/06/2015 12:08 PM

## 2015-09-07 ENCOUNTER — Other Ambulatory Visit: Payer: Self-pay | Admitting: Urology

## 2015-09-07 ENCOUNTER — Other Ambulatory Visit: Payer: Medicare Other

## 2015-09-07 DIAGNOSIS — A6000 Herpesviral infection of urogenital system, unspecified: Secondary | ICD-10-CM | POA: Diagnosis not present

## 2015-09-07 LAB — BASIC METABOLIC PANEL
BUN / CREAT RATIO: 11 (ref 10–22)
BUN: 12 mg/dL (ref 8–27)
CO2: 28 mmol/L (ref 18–29)
CREATININE: 1.13 mg/dL (ref 0.76–1.27)
Calcium: 10.2 mg/dL (ref 8.6–10.2)
Chloride: 94 mmol/L — ABNORMAL LOW (ref 96–106)
GFR calc Af Amer: 81 mL/min/{1.73_m2} (ref >59)
GFR calc non Af Amer: 70 mL/min/{1.73_m2} (ref >59)
Glucose: 125 mg/dL — ABNORMAL HIGH (ref 65–99)
POTASSIUM: 3.9 mmol/L (ref 3.5–5.2)
SODIUM: 141 mmol/L (ref 134–144)

## 2015-09-08 ENCOUNTER — Other Ambulatory Visit: Payer: Self-pay | Admitting: Family Medicine

## 2015-09-08 ENCOUNTER — Telehealth: Payer: Self-pay | Admitting: *Deleted

## 2015-09-08 DIAGNOSIS — E876 Hypokalemia: Secondary | ICD-10-CM

## 2015-09-08 DIAGNOSIS — T502X5A Adverse effect of carbonic-anhydrase inhibitors, benzothiadiazides and other diuretics, initial encounter: Principal | ICD-10-CM

## 2015-09-08 LAB — HSV(HERPES SIMPLEX VRS) I + II AB-IGG

## 2015-09-08 NOTE — Telephone Encounter (Signed)
-----   Message from Nori Riis, PA-C sent at 09/08/2015  8:16 AM EDT ----- Patient is positive for herpes type 1.  He is not positive for herpes type 2.  I suggest he uses condoms when he has intercourse with his current sexual partner.

## 2015-09-08 NOTE — Telephone Encounter (Signed)
Spoke with patient and gave results. Patient ok with results and understands the difference between 1 and 2 understands about the use of condoms.

## 2015-09-09 ENCOUNTER — Telehealth: Payer: Self-pay | Admitting: Family Medicine

## 2015-09-09 LAB — COMPLIANCE DRUG ANALYSIS, UR: PDF: 0

## 2015-09-09 NOTE — Telephone Encounter (Signed)
Please schedule for an appointment to assess patient's symptoms

## 2015-09-09 NOTE — Telephone Encounter (Signed)
Pt would like a cream called in for rash on legs. Pt was just here yesterday and forgot to discuss with Dr. Hyman Hopes

## 2015-09-09 NOTE — Telephone Encounter (Signed)
Routed to Dr. Shah for new prescription 

## 2015-09-12 ENCOUNTER — Ambulatory Visit: Payer: Medicare Other | Admitting: Urology

## 2015-09-12 ENCOUNTER — Encounter: Payer: Self-pay | Admitting: Urology

## 2015-09-14 ENCOUNTER — Other Ambulatory Visit: Payer: Self-pay | Admitting: Pain Medicine

## 2015-09-14 DIAGNOSIS — M961 Postlaminectomy syndrome, not elsewhere classified: Secondary | ICD-10-CM

## 2015-09-14 DIAGNOSIS — M47816 Spondylosis without myelopathy or radiculopathy, lumbar region: Secondary | ICD-10-CM

## 2015-09-14 DIAGNOSIS — G8929 Other chronic pain: Secondary | ICD-10-CM

## 2015-09-14 DIAGNOSIS — M5416 Radiculopathy, lumbar region: Principal | ICD-10-CM

## 2015-09-14 DIAGNOSIS — F4542 Pain disorder with related psychological factors: Secondary | ICD-10-CM | POA: Diagnosis not present

## 2015-09-14 NOTE — Progress Notes (Signed)
Quick Note:  The results of this diagnostic imaging were reviewed and found to be mildly abnormal. No acute injury or pathology identified. Consider interventional pain management techniques. The patient may be a good candidate for lumbar epidural steroid injections as well as a possible diagnostic left intra-articular hip injection if there is still evidence that there may be pain, from this joint. ______

## 2015-09-14 NOTE — Progress Notes (Signed)
Quick Note:  NOTE: This forensic urine drug screen (UDS) test was conducted using a state-of-the-art ultra high performance liquid chromatography and mass spectrometry system (UPLC/MS-MS), the most sophisticated and accurate method available. UPLC/MS-MS is 1,000 times more precise and accurate than standard gas chromatography and mass spectrometry (GC/MS). This system can analyze 26 drug categories and 180 drug compounds.  The results of this test came back with unexpected findings. Unreported oxycodone. ______

## 2015-09-14 NOTE — Progress Notes (Signed)
Quick Note:  The results of this diagnostic imaging were reviewed and found to be abnormal. Further testing may be required. This patient may benefit from an MRI of the lumbar spine. Consider interventional pain management techniques. The changes observed in this x-ray would suggest that the patient may be a good candidate for lumbar epidural steroid injections as well as diagnostic lumbar facet blocks. Because of the possibility of lumbar spinal stenosis at the levels of the spondylolisthesis, and MRI would be of great assistance in the diagnosis of this type of condition. ______

## 2015-09-15 ENCOUNTER — Telehealth: Payer: Self-pay | Admitting: Urology

## 2015-09-29 ENCOUNTER — Ambulatory Visit: Payer: Medicare Other | Admitting: Pain Medicine

## 2015-09-29 ENCOUNTER — Ambulatory Visit: Payer: Medicare Other | Admitting: Urology

## 2015-09-30 ENCOUNTER — Other Ambulatory Visit: Payer: Self-pay | Admitting: Family Medicine

## 2015-10-03 ENCOUNTER — Ambulatory Visit: Payer: Medicare Other | Attending: Pain Medicine | Admitting: Pain Medicine

## 2015-10-03 ENCOUNTER — Encounter: Payer: Self-pay | Admitting: Pain Medicine

## 2015-10-03 VITALS — BP 166/89 | HR 64 | Temp 98.0°F | Resp 16 | Ht 67.0 in | Wt 190.0 lb

## 2015-10-03 DIAGNOSIS — E785 Hyperlipidemia, unspecified: Secondary | ICD-10-CM | POA: Diagnosis not present

## 2015-10-03 DIAGNOSIS — M25519 Pain in unspecified shoulder: Secondary | ICD-10-CM | POA: Diagnosis present

## 2015-10-03 DIAGNOSIS — I10 Essential (primary) hypertension: Secondary | ICD-10-CM | POA: Diagnosis not present

## 2015-10-03 DIAGNOSIS — G5603 Carpal tunnel syndrome, bilateral upper limbs: Secondary | ICD-10-CM | POA: Diagnosis not present

## 2015-10-03 DIAGNOSIS — Z87442 Personal history of urinary calculi: Secondary | ICD-10-CM | POA: Diagnosis not present

## 2015-10-03 DIAGNOSIS — Z79891 Long term (current) use of opiate analgesic: Secondary | ICD-10-CM | POA: Diagnosis not present

## 2015-10-03 DIAGNOSIS — E1142 Type 2 diabetes mellitus with diabetic polyneuropathy: Secondary | ICD-10-CM | POA: Insufficient documentation

## 2015-10-03 DIAGNOSIS — M25512 Pain in left shoulder: Secondary | ICD-10-CM | POA: Diagnosis not present

## 2015-10-03 DIAGNOSIS — M545 Low back pain, unspecified: Secondary | ICD-10-CM

## 2015-10-03 DIAGNOSIS — M79606 Pain in leg, unspecified: Secondary | ICD-10-CM | POA: Diagnosis present

## 2015-10-03 DIAGNOSIS — M4806 Spinal stenosis, lumbar region: Secondary | ICD-10-CM | POA: Insufficient documentation

## 2015-10-03 DIAGNOSIS — M5412 Radiculopathy, cervical region: Secondary | ICD-10-CM | POA: Insufficient documentation

## 2015-10-03 DIAGNOSIS — D291 Benign neoplasm of prostate: Secondary | ICD-10-CM | POA: Insufficient documentation

## 2015-10-03 DIAGNOSIS — M75122 Complete rotator cuff tear or rupture of left shoulder, not specified as traumatic: Secondary | ICD-10-CM | POA: Insufficient documentation

## 2015-10-03 DIAGNOSIS — G8929 Other chronic pain: Secondary | ICD-10-CM | POA: Diagnosis not present

## 2015-10-03 DIAGNOSIS — R0602 Shortness of breath: Secondary | ICD-10-CM | POA: Diagnosis not present

## 2015-10-03 DIAGNOSIS — Z9049 Acquired absence of other specified parts of digestive tract: Secondary | ICD-10-CM | POA: Insufficient documentation

## 2015-10-03 DIAGNOSIS — R1011 Right upper quadrant pain: Secondary | ICD-10-CM | POA: Diagnosis not present

## 2015-10-03 DIAGNOSIS — M5416 Radiculopathy, lumbar region: Secondary | ICD-10-CM

## 2015-10-03 DIAGNOSIS — E781 Pure hyperglyceridemia: Secondary | ICD-10-CM | POA: Diagnosis not present

## 2015-10-03 DIAGNOSIS — M25511 Pain in right shoulder: Secondary | ICD-10-CM | POA: Diagnosis not present

## 2015-10-03 DIAGNOSIS — E876 Hypokalemia: Secondary | ICD-10-CM | POA: Insufficient documentation

## 2015-10-03 DIAGNOSIS — Z5181 Encounter for therapeutic drug level monitoring: Secondary | ICD-10-CM

## 2015-10-03 DIAGNOSIS — E7800 Pure hypercholesterolemia, unspecified: Secondary | ICD-10-CM | POA: Insufficient documentation

## 2015-10-03 DIAGNOSIS — R109 Unspecified abdominal pain: Secondary | ICD-10-CM | POA: Diagnosis not present

## 2015-10-03 DIAGNOSIS — K219 Gastro-esophageal reflux disease without esophagitis: Secondary | ICD-10-CM | POA: Insufficient documentation

## 2015-10-03 DIAGNOSIS — M069 Rheumatoid arthritis, unspecified: Secondary | ICD-10-CM | POA: Insufficient documentation

## 2015-10-03 DIAGNOSIS — M5442 Lumbago with sciatica, left side: Secondary | ICD-10-CM | POA: Insufficient documentation

## 2015-10-03 DIAGNOSIS — M549 Dorsalgia, unspecified: Secondary | ICD-10-CM | POA: Diagnosis present

## 2015-10-03 MED ORDER — OXYCODONE HCL 10 MG PO TABS: 10.0000 mg | ORAL_TABLET | Freq: Four times a day (QID) | ORAL | Status: DC | PRN

## 2015-10-03 NOTE — Progress Notes (Signed)
Safety precautions to be maintained throughout the outpatient stay will include: orient to surroundings, keep bed in low position, maintain call bell within reach at all times, provide assistance with transfer out of bed and ambulation. No medication, second visit.

## 2015-10-03 NOTE — Progress Notes (Signed)
Patient's Name: Howard Murphy  Patient type: Established  MRN: [id number]  Service setting: Ambulatory outpatient  DOB: [birth year]  Location: [facility]  DOS: 10/03/2015  Primary Care Physician: Keith Rake, MD  Note by: Kathlen Brunswick. Dossie Arbour, M.D, DABA, DABAPM, DABPM, Milagros Evener, FIPP  Referring Physician: Roselee Nova, MD  Specialty: Board-Certified Interventional Pain Management     Primary Reason(s) for Visit: Encounter for prescription drug management (Level of risk: moderate) CC: Back Pain; Leg Pain; Foot Pain; and Shoulder Pain   HPI  Howard Murphy is a 60 y.o. year old, male patient, who returns today as an established patient. 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); Chest pain; Hypertriglyceridemia; Chronic shoulder pain (Right); Chronic pain; Opiate use (30 MME/Day); Long term prescription opiate use; Long term current use of opiate analgesic; Chronic low back pain; Lumbar spondylosis; Lumbar facet syndrome (Bilateral) (L>R); Chronic hip pain (Left); Chronic sacroiliac joint pain (Left); Diabetic peripheral neuropathy ([place]); Chronic lumbar radicular pain (S1 Dermatome) (Bilateral) (R>L); Hypokalemia; Complete tear of the shoulder rotator cuff (Left); Chronic shoulder pain (Location of Primary Source of Pain) (Bilateral) (L>R); Non-insulin dependent type 2 diabetes mellitus ([place]); Chronic foot pain (Location of Secondary source of pain) (Bilateral) (L>R); Chronic hand pain (Location of Tertiary source of pain) (Bilateral) (R>L); Lumbar facet hypertrophy; Encounter for  therapeutic drug level monitoring; Encounter for pain management planning; Low testosterone; Occipital pain (Right); Failed back surgical syndrome 2; Diuretic-induced hypokalemia; Chronic low back pain with left-sided sciatica; Radicular pain of shoulder (Right); Carpal tunnel syndrome, bilateral; and Chronic radicular cervical pain on his problem list.. His primarily concern today is the Back Pain; Leg Pain; Foot Pain; and Shoulder Pain   Pain Assessment: Self-Reported Pain Score: 5 , clinically he looks like a 2/10. Reported level is inconsistent with clinical obrservations Pain Type: Chronic pain Pain Location: Back Pain Orientation: Lower Pain Descriptors / Indicators: Aching, Constant, Radiating, Pins and needles Pain Frequency: Constant  The patient comes into the clinics today for pharmacological management of his chronic pain. He returns to the clinics today after his initial evaluation.  Date of Last Visit: 09/05/15 Service Provided on Last Visit: Evaluation (intial evaluation)  Controlled Substance Pharmacotherapy Assessment & REMS (Risk Evaluation and Mitigation Strategy)  Analgesic: Oxycodone IR 10 mg every 6 hours (20 mg per day) Pill Count: This is patient's second visit and he has no medications from our service. MME/day: 30 mg/day Pharmacokinetics: Onset of action (Liberation/Absorption): Within expected pharmacological parameters Time to Peak effect (Distribution): Timing and results are as within normal expected parameters Duration of action (Metabolism/Excretion): Within normal limits for medication Pharmacodynamics: Analgesic Effect: More than 50% Activity Facilitation: Medication(s) allow patient to sit, stand, walk, and do the basic ADLs Perceived Effectiveness: Described as relatively effective, allowing for increase in activities of daily living (ADL) Side-effects or Adverse reactions: None reported Monitoring: [place] PMP: Online review of the past 43-month period  conducted. Compliant with practice rules and regulations UDS Results/interpretation: The patient's last UDS was done on 09/05/2015 and it came back abnormal with unexpected levels of oxycodone and ibuprofen, neither which the patient hadn't declare. Medication Assessment Form: Reviewed. Patient indicates being compliant with therapy Treatment compliance: Compliant Risk  Assessment: Aberrant Behavior: None observed today Substance Use Disorder (SUD) Risk Level: High Risk of opioid abuse or dependence: 0.7-3.0% with doses ? 36 MME/day and 6.1-26% with doses ? 120 MME/day. Opioid Risk Tool (ORT) Score:  0 Low Risk for SUD (Score <3) Depression Scale Score: PHQ-2: PHQ-2 Total Score: 0 No depression (0) PHQ-9: PHQ-9 Total Score: 0 No depression (0-4)  Pharmacologic Plan: No change in therapy, at this time  Laboratory Chemistry  Inflammation Markers Lab Results  Component Value Date   ESRSEDRATE 1 09/05/2015   CRP 0.6 09/05/2015    Renal Function Lab Results  Component Value Date   BUN 12 09/06/2015   CREATININE 1.13 09/06/2015   GFRAA 81 09/06/2015   GFRNONAA 70 09/06/2015    Hepatic Function Lab Results  Component Value Date   AST 38 09/05/2015   ALT 46 09/05/2015   ALBUMIN 4.6 09/05/2015    Electrolytes Lab Results  Component Value Date   NA 141 09/06/2015   K 3.9 09/06/2015   CL 94* 09/06/2015   CALCIUM 10.2 09/06/2015   MG 2.1 09/05/2015    Pain Modulating Vitamins No results found for: [place], [id number], [id number], [id number], VITAMINB12  Coagulation Parameters No results found for: INR, LABPROT  Note: I personally reviewed the above data. Results shared with patient.  Meds  The patient has a current medication list which includes the following prescription(s): amlodipine, chlorthalidone, lisinopril, omeprazole, ondansetron, potassium chloride sa, rosuvastatin, sitagliptin, sucralfate, testosterone cypionate, and oxycodone hcl.  Current Outpatient  Prescriptions on File Prior to Visit  Medication Sig  . amLODipine (NORVASC) 5 MG tablet Take 1 tablet (5 mg total) by mouth daily.  Marland Kitchen lisinopril (PRINIVIL,ZESTRIL) 20 MG tablet Take 20 mg by mouth daily.  Marland Kitchen omeprazole (PRILOSEC) 40 MG capsule Take 40 mg by mouth 2 (two) times daily.  . ondansetron (ZOFRAN-ODT) 4 MG disintegrating tablet Take 4 mg by mouth every 8 (eight) hours as needed for nausea or vomiting.  . potassium chloride SA (KLOR-CON M20) 20 MEQ tablet Take 1 tablet (20 mEq total) by mouth daily. (Patient taking differently: Take 20 mEq by mouth as needed. )  . rosuvastatin (CRESTOR) 20 MG tablet Take 1 tablet (20 mg total) by mouth daily.  . sitaGLIPtin (JANUVIA) 50 MG tablet Take 1 tablet (50 mg total) by mouth daily.  . sucralfate (CARAFATE) 1 G tablet Take 1 tablet by mouth 4 (four) times daily.  Marland Kitchen testosterone cypionate (DEPOTESTOSTERONE CYPIONATE) 200 MG/ML injection Inject 200 mg into the muscle every 14 (fourteen) days.   No current facility-administered medications on file prior to visit.    ROS  Constitutional: Afebrile, no chills, well hydrated and well nourished Gastrointestinal: No upper or lower GI bleeding, no nausea, no vomiting and no acute GI distress Musculoskeletal: No acute joint swelling or redness, no acute loss of range of motion and no acute onset weakness Neurological: Denies any acute onset apraxia, no episodes of paralysis, no acute loss of coordination, no acute loss of consciousness and no acute onset aphasia, dysarthria, agnosia, or amnesia  Allergies  Mr. Kean has No Known Allergies.  [place]  Medical:  Mr. Wolsky  has a past medical history of Diabetes mellitus without complication ([place]); Hypertension; GERD (gastroesophageal reflux disease); Hyperlipemia; Nephrolithiasis; Shortness of breath dyspnea; Arthritis; and Back ache (05/05/2013). Family: family history includes Cancer in his maternal grandmother, mother, sister, and sister; Heart  disease in his father; Stroke in his father. There is no history of Bladder Cancer, Prostate cancer, or Kidney  cancer. Surgical:  has past surgical history that includes Cholecystectomy (2000); Back surgery (2015); Shoulder surgery (Right, 2016); Colonoscopy with propofol (N/A, 04/11/2015); and Esophagogastroduodenoscopy (egd) with propofol (N/A, 04/11/2015). Tobacco:  reports that he has never smoked. He has never used smokeless tobacco. Alcohol:  reports that he does not drink alcohol. Drug:  reports that he does not use illicit drugs.  Physical Examination  Constitutional Vitals: Blood pressure 166/89, pulse 64, temperature 98 F (36.7 C), temperature source Oral, resp. rate 16, height 5\' 7"  (1.702 m), weight 190 lb (86.183 kg), SpO2 97 %. Calculated BMI: Body mass index is 29.75 kg/(m^2). (25-29.9 kg/m2) Overweight - 20% higher incidence of chronic pain General appearance: Alert, oriented x 3, in no apparent acute distress. Well nourished, well hydrated Eyes: PERLA Respiratory: No evidence respiratory distress, no audible rales or ronchi and no use of accessory muscles of respiration Psych: Alert, oriented to person, oriented to place and oriented to time  Cervical Spine Exam  Inspection: Normal anatomy, no anomalies observed Cervical Lordosis: Normal Alignment: Symetrical Functional ROM: Within functional limits (WFL) AROM: Decreased Sensory: No sensory anomalies reported or detected  Upper Extremity Exam    Right  Left  Inspection: No gross anomalies detected  Inspection: No gross anomalies detected  Functional ROM: Limited for the shoulder   Functional ROM: Limited for the shoulder   AROM: Decreased for the shoulder   AROM: Decreased for the shoulder   Sensory: No sensory anomalies reported or detected  Sensory: No sensory anomalies reported or detected  Motor: Unremarkable  Motor: Unremarkable  Vascular: Normal skin color, temperature, and hair growth. No peripheral edema or  cyanosis  Vascular: Normal skin color, temperature, and hair growth. No peripheral edema or cyanosis   Thoracic Spine  Inspection: No gross anomalies detected Alignment: Symetrical Functional ROM: Within functional limits [facility]) AROM: Adequate Palpation: WNL  Lumbar Spine  Inspection: No gross anomalies detected Alignment: Symetrical Functional ROM: Within functional limits [facility]) AROM: Decreased Sensory: No sensory anomalies reported or detected Palpation: Tender Provocative Tests: Lumbar Hyperextension and rotation test: Positive bilaterally for lumbar facet pain. Patrick's Maneuver: deferred  Gait Assessment  Gait: Antalgic (limping)  Lower Extremities    Right  Left  Inspection: No gross anomalies detected  Inspection: No gross anomalies detected  Functional ROM: Within functional limits [facility])  Functional ROM: Within functional limits (WFL)  AROM: Adequate  AROM: Adequate  Sensory: No sensory anomalies reported or detected  Sensory: No sensory anomalies reported or detected  Motor: Grossly normal  Motor: Grossly normal  Toe walk (S1): WNL  Toe walk (S1): WNL  Heal walk (L5): WNL  Heal walk (L5): WNL   Assessment & Plan  Primary Diagnosis & Pertinent Problem List: The primary encounter diagnosis was Chronic pain. Diagnoses of Encounter for therapeutic drug level monitoring, Long term current use of opiate analgesic, Chronic shoulder pain (Location of Primary Source of Pain) (Bilateral) (L>R), Chronic lumbar radicular pain (S1 Dermatome) (Bilateral) (R>L), Chronic low back pain, Radicular pain of shoulder (Right), Carpal tunnel syndrome, bilateral, and Chronic radicular cervical pain were also pertinent to this visit.  Visit Diagnosis: 1. Chronic pain   2. Encounter for therapeutic drug level monitoring   3. Long term current use of opiate analgesic   4. Chronic shoulder pain (Location of Primary Source of Pain) (Bilateral) (L>R)   5. Chronic lumbar radicular pain (S1  Dermatome) (Bilateral) (R>L)   6. Chronic low back pain   7. Radicular pain of shoulder (Right)  8. Carpal tunnel syndrome, bilateral   9. Chronic radicular cervical pain     Problems updated and reviewed during this visit: No problems updated.  Problem-specific Plan(s): No problem-specific assessment & plan notes found for this encounter.  No new assessment & plan notes have been filed under this hospital service since the last note was generated. Service: Pain Management   Plan of Care   Problem List Items Addressed This Visit      High   Carpal tunnel syndrome, bilateral (Chronic)   Relevant Orders   NCV with EMG(electromyography)   Chronic low back pain (Chronic)   Relevant Medications   Oxycodone HCl 10 MG TABS   Other Relevant Orders   MR Lumbar Spine Wo Contrast   Chronic lumbar radicular pain (S1 Dermatome) (Bilateral) (R>L) (Chronic)   Relevant Orders   MR Lumbar Spine Wo Contrast   Chronic pain - Primary (Chronic)   Relevant Medications   Oxycodone HCl 10 MG TABS   Chronic radicular cervical pain (Chronic)   Relevant Orders   CT Cervical Spine Wo Contrast   DG Cervical Spine Complete   NCV with EMG(electromyography)   Chronic shoulder pain (Location of Primary Source of Pain) (Bilateral) (L>R) (Chronic)   Radicular pain of shoulder (Right) (Chronic)   Relevant Orders   CT Cervical Spine Wo Contrast   DG Cervical Spine Complete     Medium   Encounter for therapeutic drug level monitoring   Long term current use of opiate analgesic (Chronic)   Relevant Orders   ToxASSURE Select 13 (MW), Urine       Pharmacotherapy (Medications Ordered): Meds ordered this encounter  Medications  . Oxycodone HCl 10 MG TABS    Sig: Take 1 tablet (10 mg total) by mouth every 6 (six) hours as needed.    Dispense:  120 tablet    Refill:  0    Do not place this medication, or any other prescription from our practice, on "Automatic Refill". Patient may have prescription  filled one day early if pharmacy is closed on scheduled refill date. Do not fill until: 10/03/15 To last until: 11/02/15    [facility] & Procedure Ordered: Orders Placed This Encounter  Procedures  . MR Lumbar Spine Wo Contrast  . CT Cervical Spine Wo Contrast  . DG Cervical Spine Complete  . ToxASSURE Select 13 (MW), Urine  . NCV with EMG(electromyography)    Imaging Ordered: MR LUMBAR SPINE WO CONTRAST CT CERVICAL SPINE WO CONTRAST DG CERVICAL SPINE COMPLETE  Interventional Therapies: Scheduled:  None at this time.    Considering:   1. Cervical epidural steroid injection on the fluoroscopic guidance and IV sedation.  2. L4-5 and/or L5-S1 Lumbar epidural steroid injection under fluoroscopic guidance and IV sedation.  3. Bilateral lumbar facet block under fluoroscopic guidance and IV sedation.  4. Bilateral carpal tunnel block without fluoroscopy or sedation.  5. Diagnostic right-sided greater occipital nerve block under fluoroscopic guidance and IV sedation.    PRN Procedures:  None at this time.    Referral(s) or Consult(s): None at this time.  New Prescriptions   OXYCODONE HCL 10 MG TABS    Take 1 tablet (10 mg total) by mouth every 6 (six) hours as needed.    Medications administered during this visit: Mr. Krahmer had no medications administered during this visit.  Future Appointments Date Time Provider [place]  10/06/2015 1:45 PM Nori Riis, PA-C [facility] None  10/24/2015 8:40 AM Milinda Pointer, MD [facility] None  06/19/2016 11:00 AM Pearletha Alfred None    Primary Care Physician: Keith Rake, MD Location: [facility] Note by: Kathlen Brunswick. Dossie Arbour, M.D, DABA, DABAPM, DABPM, DABIPP, FIPP  Pain Score Disclaimer: We use the NRS-11 scale. This is a self-reported, subjective measurement of pain severity with only modest accuracy. It is used primarily to identify changes within a particular patient. It must be  understood that outpatient pain scales are significantly less accurate that those used for research, where they can be applied under ideal controlled circumstances with minimal exposure to variables. In reality, the score is likely to be a combination of pain intensity and pain affect, where pain affect describes the degree of emotional arousal or changes in action readiness caused by the sensory experience of pain. Factors such as social and work situation, setting, emotional state, anxiety levels, expectation, and prior pain experience may influence pain perception and show large inter-individual differences that may also be affected by time variables.

## 2015-10-06 ENCOUNTER — Telehealth: Payer: Self-pay | Admitting: Urology

## 2015-10-06 ENCOUNTER — Ambulatory Visit: Payer: Medicare Other | Admitting: Urology

## 2015-10-06 DIAGNOSIS — N281 Cyst of kidney, acquired: Secondary | ICD-10-CM

## 2015-10-06 NOTE — Telephone Encounter (Signed)
Pt called to cancel appt.  He didn't have CT and said he would call if he needs to come back.

## 2015-10-08 LAB — TOXASSURE SELECT 13 (MW), URINE: PDF: 0

## 2015-10-10 NOTE — Telephone Encounter (Signed)
Patient will need a CT of the abdomen with and without contrast in 06/2016.

## 2015-10-11 NOTE — Telephone Encounter (Signed)
Spoke with pt in reference to cancelling appt and the need of another CT in 2018. Pt voiced understanding. Orders placed.

## 2015-10-16 NOTE — Progress Notes (Signed)
Quick Note:  NOTE: This forensic urine drug screen (UDS) test was conducted using a state-of-the-art ultra high performance liquid chromatography and mass spectrometry system (UPLC/MS-MS), the most sophisticated and accurate method available. UPLC/MS-MS is 1,000 times more precise and accurate than standard gas chromatography and mass spectrometry (GC/MS). This system can analyze 26 drug categories and 180 drug compounds.  The results of this test came back with unexpected findings. Unreported Benzodiazepine. ______

## 2015-10-24 ENCOUNTER — Ambulatory Visit: Payer: Medicare Other | Attending: Pain Medicine | Admitting: Pain Medicine

## 2015-10-24 ENCOUNTER — Encounter: Payer: Self-pay | Admitting: Pain Medicine

## 2015-10-24 VITALS — BP 141/89 | HR 63 | Temp 98.3°F | Resp 18 | Ht 67.0 in | Wt 190.0 lb

## 2015-10-24 DIAGNOSIS — Z5181 Encounter for therapeutic drug level monitoring: Secondary | ICD-10-CM

## 2015-10-24 DIAGNOSIS — M961 Postlaminectomy syndrome, not elsewhere classified: Secondary | ICD-10-CM

## 2015-10-24 DIAGNOSIS — R0602 Shortness of breath: Secondary | ICD-10-CM | POA: Diagnosis not present

## 2015-10-24 DIAGNOSIS — I10 Essential (primary) hypertension: Secondary | ICD-10-CM | POA: Insufficient documentation

## 2015-10-24 DIAGNOSIS — M5116 Intervertebral disc disorders with radiculopathy, lumbar region: Secondary | ICD-10-CM | POA: Diagnosis not present

## 2015-10-24 DIAGNOSIS — G8929 Other chronic pain: Secondary | ICD-10-CM

## 2015-10-24 DIAGNOSIS — R1011 Right upper quadrant pain: Secondary | ICD-10-CM | POA: Insufficient documentation

## 2015-10-24 DIAGNOSIS — F1190 Opioid use, unspecified, uncomplicated: Secondary | ICD-10-CM

## 2015-10-24 DIAGNOSIS — Z87442 Personal history of urinary calculi: Secondary | ICD-10-CM | POA: Diagnosis not present

## 2015-10-24 DIAGNOSIS — M25511 Pain in right shoulder: Secondary | ICD-10-CM | POA: Diagnosis not present

## 2015-10-24 DIAGNOSIS — Z9889 Other specified postprocedural states: Secondary | ICD-10-CM | POA: Insufficient documentation

## 2015-10-24 DIAGNOSIS — M4806 Spinal stenosis, lumbar region: Secondary | ICD-10-CM | POA: Diagnosis not present

## 2015-10-24 DIAGNOSIS — G5603 Carpal tunnel syndrome, bilateral upper limbs: Secondary | ICD-10-CM | POA: Diagnosis not present

## 2015-10-24 DIAGNOSIS — M545 Low back pain: Secondary | ICD-10-CM | POA: Diagnosis not present

## 2015-10-24 DIAGNOSIS — D291 Benign neoplasm of prostate: Secondary | ICD-10-CM | POA: Diagnosis not present

## 2015-10-24 DIAGNOSIS — M533 Sacrococcygeal disorders, not elsewhere classified: Secondary | ICD-10-CM | POA: Insufficient documentation

## 2015-10-24 DIAGNOSIS — M4726 Other spondylosis with radiculopathy, lumbar region: Secondary | ICD-10-CM | POA: Insufficient documentation

## 2015-10-24 DIAGNOSIS — E876 Hypokalemia: Secondary | ICD-10-CM | POA: Insufficient documentation

## 2015-10-24 DIAGNOSIS — M47816 Spondylosis without myelopathy or radiculopathy, lumbar region: Secondary | ICD-10-CM

## 2015-10-24 DIAGNOSIS — M539 Dorsopathy, unspecified: Secondary | ICD-10-CM

## 2015-10-24 DIAGNOSIS — E785 Hyperlipidemia, unspecified: Secondary | ICD-10-CM | POA: Insufficient documentation

## 2015-10-24 DIAGNOSIS — E7800 Pure hypercholesterolemia, unspecified: Secondary | ICD-10-CM | POA: Insufficient documentation

## 2015-10-24 DIAGNOSIS — M25519 Pain in unspecified shoulder: Secondary | ICD-10-CM | POA: Diagnosis present

## 2015-10-24 DIAGNOSIS — M069 Rheumatoid arthritis, unspecified: Secondary | ICD-10-CM | POA: Insufficient documentation

## 2015-10-24 DIAGNOSIS — E1140 Type 2 diabetes mellitus with diabetic neuropathy, unspecified: Secondary | ICD-10-CM | POA: Insufficient documentation

## 2015-10-24 DIAGNOSIS — M5442 Lumbago with sciatica, left side: Secondary | ICD-10-CM | POA: Insufficient documentation

## 2015-10-24 DIAGNOSIS — K219 Gastro-esophageal reflux disease without esophagitis: Secondary | ICD-10-CM | POA: Insufficient documentation

## 2015-10-24 DIAGNOSIS — Z79891 Long term (current) use of opiate analgesic: Secondary | ICD-10-CM | POA: Diagnosis not present

## 2015-10-24 MED ORDER — OXYCODONE HCL 10 MG PO TABS: 10.0000 mg | ORAL_TABLET | Freq: Four times a day (QID) | ORAL | Status: DC | PRN

## 2015-10-24 MED ORDER — NALOXONE HCL 4 MG/0.1ML NA LIQD: NASAL | Status: DC

## 2015-10-24 NOTE — Progress Notes (Signed)
Patient's Name: Howard Murphy  Patient type: Established  MRN: [id number]  Service setting: Ambulatory outpatient  DOB: 01/06/1956  Location: [facility]  DOS: 10/24/2015  Primary Care Physician: Keith Rake, MD  Note by: Kathlen Brunswick. Dossie Arbour, M.D, DABA, DABAPM, DABPM, Milagros Evener, FIPP  Referring Physician: Roselee Nova, MD  Specialty: Board-Certified Interventional Pain Management  Last Visit to Pain Management: 10/03/2015   Primary Reason(s) for Visit: Encounter for prescription drug management (Level of risk: moderate) CC: Other and Shoulder Pain   HPI  Howard Murphy is a 60 y.o. year old, male patient, who returns today as an established patient. 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); Elevated hemoglobin ([place]); Acid reflux; BP (high blood pressure); Hypercholesteremia; HLD (hyperlipidemia); Arthritis or polyarthritis, rheumatoid ([place]); History of lumbar facet Synovial cyst, surgically removed.; Rheumatoid arthritis ([place]); Breath shortness; Dyspnea; Chronic abdominal pain (RUQ); Type 2 diabetes mellitus with peripheral neuropathy ([place]); Chest pain; Hypertriglyceridemia; Chronic shoulder pain (Right); Chronic pain; Opiate use (60 MME/Day); Long term prescription opiate use; Long term current use of opiate analgesic; Chronic low back pain; Lumbar spondylosis; Lumbar facet syndrome (Bilateral) (L>R); Chronic hip pain (Left); Chronic sacroiliac joint pain (Left); Diabetic peripheral neuropathy ([place]); Chronic lumbar radicular pain (S1 Dermatome) (Bilateral) (R>L); Hypokalemia; Complete tear of the shoulder rotator cuff (Left); Chronic shoulder pain (Location of Primary Source of Pain) (Bilateral) (L>R); Non-insulin dependent type 2 diabetes mellitus ([place]); Chronic foot pain (Location of Secondary source of pain) (Bilateral) (L>R); Chronic hand pain (Location of Tertiary source of pain) (Bilateral) (R>L); Lumbar facet hypertrophy; Encounter  for therapeutic drug level monitoring; Encounter for pain management planning; Low testosterone; Occipital pain (Right); Failed back surgical syndrome 2; Diuretic-induced hypokalemia; Chronic low back pain with left-sided sciatica; Radicular pain of shoulder (Right); Carpal tunnel syndrome, bilateral; and Chronic radicular cervical pain on his problem list.. His primarily concern today is the Other and Shoulder Pain   Pain Assessment: Self-Reported Pain Score: 4 , clinically he looks like a 1/10. Reported level is inconsistent with clinical obrservations Pain Type: Chronic pain Pain Location: Shoulder Pain Orientation: Left Pain Descriptors / Indicators: Tingling, Pins and needles, Aching, Numbness Pain Frequency: Constant  The patient comes into the clinics today for pharmacological management of his chronic pain. I last saw this patient on 10/03/2015. His body mass index is 29.75 kg/(m^2). The patient  reports that he does not use illicit drugs.  Date of Last Visit: 10/03/15 Service Provided on Last Visit: Med Refill  Controlled Substance Pharmacotherapy Assessment & REMS (Risk Evaluation and Mitigation Strategy)  Analgesic: Oxycodone IR 10 mg every 6 hours (40 mg/day) Pill Count: 36 pills MME/day: 60 mg/day Pharmacokinetics: Onset of action (Liberation/Absorption): Within expected pharmacological parameters Time to Peak effect (Distribution): Timing and results are as within normal expected parameters Duration of action (Metabolism/Excretion): Within normal limits for medication Pharmacodynamics: Analgesic Effect: More than 50% Activity Facilitation: Medication(s) allow patient to sit, stand, walk, and do the basic ADLs Perceived Effectiveness: Described as relatively effective, allowing for increase in activities of daily living (ADL) Side-effects or Adverse reactions: None reported Monitoring: [place] PMP: Online review of the past 44-month period conducted. Compliant with practice  rules and regulations UDS Results/interpretation: The patient's last UDS was done on 10/03/2015 8 came back with unexpected results due to the presence of unreported benzodiazepines and no oxycodone. We have talked to the patient about the CDC guidelines and we will repeat test today. Medication Assessment Form: Reviewed.  Patient indicates being compliant with therapy Treatment compliance: Compliant Risk Assessment: Aberrant Behavior: None observed today Substance Use Disorder (SUD) Risk Level: High Risk of opioid abuse or dependence: 0.7-3.0% with doses ? 36 MME/day and 6.1-26% with doses ? 120 MME/day. Opioid Risk Tool (ORT) Score:  0 Low Risk for SUD (Score <3) Depression Scale Score: PHQ-2:      PHQ-9:       Pharmacologic Plan: No change in therapy, at this time  Laboratory Chemistry  Inflammation Markers Lab Results  Component Value Date   ESRSEDRATE 1 09/05/2015   CRP 0.6 09/05/2015    Renal Function Lab Results  Component Value Date   BUN 12 09/06/2015   CREATININE 1.13 09/06/2015   GFRAA 81 09/06/2015   GFRNONAA 70 09/06/2015    Hepatic Function Lab Results  Component Value Date   AST 38 09/05/2015   ALT 46 09/05/2015   ALBUMIN 4.6 09/05/2015    Electrolytes Lab Results  Component Value Date   NA 141 09/06/2015   K 3.9 09/06/2015   CL 94* 09/06/2015   CALCIUM 10.2 09/06/2015   MG 2.1 09/05/2015    Pain Modulating Vitamins No results found for: [place], VD125OH2TOT, [id number], [id number], VITAMINB12  Coagulation Parameters No results found for: INR, LABPROT  Note: I personally reviewed the above data. Results made available to patient.  Recent Diagnostic Imaging  Dg Lumbar Spine Complete W/bend  09/05/2015  CLINICAL DATA:  60 year old male with chronic lumbar back pain. Symptoms increasing for 2 years. No recent injury. Pain radiating to the left hip. Initial encounter. EXAM: LUMBAR SPINE - COMPLETE WITH BENDING VIEWS COMPARISON:  Lumbar MRI  01/27/2009. CT Abdomen and Pelvis 04/13/2015 FINDINGS: Partially lumbarized S1 level with bilateral sclerotic S1-S2 assimilation joints. Associated anterior superior SI joint osteophytosis, better demonstrated in 2016. Trace grade 1 anterolisthesis at L5-S1. Moderate to severe chronic facet degeneration at that level. Moderate facet degeneration at L4-L5 and L3-L4. No pars fracture. Relatively preserved lumbar disc spaces. Visible lower thoracic levels appear intact. Lateral views in flexion and extension. No abnormal motion identified in the lumbar spine. IMPRESSION: 1. Moderate to severe lower lumbar facet degeneration, maximal at L5-S1 where there is stable trace anterolisthesis. 2. Moderate S1-S2 and anterior superior SI joint degeneration, in part due to partially lumbarized S1 level. Electronically Signed   By: Genevie Ann M.D.   On: 09/05/2015 15:42   Dg Hip Unilat W Or W/o Pelvis 2-3 Views Left  09/05/2015  CLINICAL DATA:  Chronic back pain, injury were 20 years ago, increased pain in last 2 years, LEFT hip and low back pain, SI joint pain, history hypertension, diabetes mellitus EXAM: DG HIP (WITH OR WITHOUT PELVIS) 2-3V LEFT COMPARISON:  None FINDINGS: Osseous mineralization normal. Hip and SI joint spaces symmetric and preserved. No acute fracture, dislocation or bone destruction. Few scattered pelvic phleboliths. Disc space narrowing L4-L5, questionably L5-S1. IMPRESSION: No significant LEFT hip abnormalities. Degenerative disc disease changes L4-L5. Electronically Signed   By: Lavonia Dana M.D.   On: 09/05/2015 15:43   Results reviewed with the patient today.  Meds  The patient has a current medication list which includes the following prescription(s): amlodipine, chlorthalidone, cyanocobalamin, icosapent ethyl, lisinopril, omeprazole, ondansetron, oxycodone hcl, sitagliptin, sucralfate, testosterone cypionate, and naloxone hcl.  Current Outpatient Prescriptions on File Prior to Visit  Medication  Sig  . amLODipine (NORVASC) 5 MG tablet Take 1 tablet (5 mg total) by mouth daily.  . chlorthalidone (HYGROTON) 50 MG tablet   . lisinopril (  PRINIVIL,ZESTRIL) 20 MG tablet Take 20 mg by mouth daily. Reported on 10/24/2015  . omeprazole (PRILOSEC) 40 MG capsule Take 40 mg by mouth 2 (two) times daily.  . ondansetron (ZOFRAN-ODT) 4 MG disintegrating tablet Take 4 mg by mouth every 8 (eight) hours as needed for nausea or vomiting.  . sitaGLIPtin (JANUVIA) 50 MG tablet Take 1 tablet (50 mg total) by mouth daily.  . sucralfate (CARAFATE) 1 G tablet Take 1 tablet by mouth 4 (four) times daily.  Marland Kitchen testosterone cypionate (DEPOTESTOSTERONE CYPIONATE) 200 MG/ML injection Inject 200 mg into the muscle every 14 (fourteen) days.   No current facility-administered medications on file prior to visit.    ROS  Constitutional: Denies any fever or chills Gastrointestinal: No reported hemesis, hematochezia, vomiting, or acute GI distress Musculoskeletal: Denies any acute onset joint swelling, redness, loss of ROM, or weakness Neurological: No reported episodes of acute onset apraxia, aphasia, dysarthria, agnosia, amnesia, paralysis, loss of coordination, or loss of consciousness  Allergies  Mr. Skarda has No Known Allergies.  [place]  Medical:  Mr. Kendrick  has a past medical history of Diabetes mellitus without complication ([place]); Hypertension; GERD (gastroesophageal reflux disease); Hyperlipemia; Nephrolithiasis; Shortness of breath dyspnea; Arthritis; and Back ache (05/05/2013). Family: family history includes Cancer in his maternal grandmother, mother, sister, and sister; Heart disease in his father; Stroke in his father. There is no history of Bladder Cancer, Prostate cancer, or Kidney cancer. Surgical:  has past surgical history that includes Cholecystectomy (2000); Back surgery (2015); Shoulder surgery (Right, 2016); Colonoscopy with propofol (N/A, 04/11/2015); and Esophagogastroduodenoscopy (egd) with  propofol (N/A, 04/11/2015). Tobacco:  reports that he has never smoked. He has never used smokeless tobacco. Alcohol:  reports that he does not drink alcohol. Drug:  reports that he does not use illicit drugs.  Constitutional Exam  Vitals: Blood pressure 141/89, pulse 63, temperature 98.3 F (36.8 C), temperature source Oral, resp. rate 18, height 5\' 7"  (1.702 m), weight 190 lb (86.183 kg), SpO2 98 %. General appearance: Well nourished, well developed, and well hydrated. In no acute distress Calculated BMI/Body habitus: Body mass index is 29.75 kg/(m^2). (25-29.9 kg/m2) Overweight - 20% higher incidence of chronic pain Psych/Mental status: Alert and oriented x 3 (person, place, & time) Eyes: PERLA Respiratory: No evidence of acute respiratory distress  Cervical Spine Exam  Inspection: No masses, redness, or swelling Alignment: Symmetrical ROM: Functional: Adequate ROM Active: Unrestricted ROM Stability: No instability detected Muscle strength & Tone: Functionally intact Sensory: Unimpaired Palpation: No complaints of tenderness  Upper Extremity (UE) Exam    Side: Right upper extremity  Side: Left upper extremity  Inspection: No masses, redness, swelling, or asymmetry  Inspection: No masses, redness, swelling, or asymmetry  ROM:  ROM:  Functional: Adequate ROM  Functional: Adequate ROM  Active: Unrestricted ROM  Active: Unrestricted ROM  Muscle strength & Tone: Functionally intact  Muscle strength & Tone: Functionally intact  Sensory: Unimpaired  Sensory: Unimpaired  Palpation: Non-contributory  Palpation: Non-contributory   Thoracic Spine Exam  Inspection: No masses, redness, or swelling Alignment: Symmetrical ROM: Functional: Adequate ROM Active: Unrestricted ROM Stability: No instability detected Sensory: Unimpaired Muscle strength & Tone: Functionally intact Palpation: No complaints of tenderness  Lumbar Spine Exam  Inspection: No masses, redness, or  swelling Alignment: Symmetrical ROM: Functional: Adequate ROM Active: Unrestricted ROM Stability: No instability detected Muscle strength & Tone: Functionally intact Sensory: Unimpaired Palpation: No complaints of tenderness Provocative Tests: Lumbar Hyperextension and rotation test: deferred Patrick's Maneuver: deferred  Gait &  Posture Assessment  Gait: Unaffected Posture: WNL  Lower Extremity Exam    Side: Right lower extremity  Side: Left lower extremity  Inspection: No masses, redness, swelling, or asymmetry ROM:  Inspection: No masses, redness, swelling, or asymmetry ROM:  Functional: Adequate ROM  Functional: Adequate ROM  Active: Unrestricted ROM  Active: Unrestricted ROM  Muscle strength & Tone: Functionally intact  Muscle strength & Tone: Functionally intact  Sensory: Unimpaired  Sensory: Unimpaired  Palpation: Non-contributory  Palpation: Non-contributory   Assessment & Plan  Primary Diagnosis & Pertinent Problem List: The primary encounter diagnosis was Chronic pain. Diagnoses of Encounter for therapeutic drug level monitoring, Long term current use of opiate analgesic, Osteoarthritis of spine with radiculopathy, lumbar region, Failed back surgical syndrome 2, Lumbar facet syndrome (Bilateral) (L>R), and Opiate use (60 MME/Day) were also pertinent to this visit.  Visit Diagnosis: 1. Chronic pain   2. Encounter for therapeutic drug level monitoring   3. Long term current use of opiate analgesic   4. Osteoarthritis of spine with radiculopathy, lumbar region   5. Failed back surgical syndrome 2   6. Lumbar facet syndrome (Bilateral) (L>R)   7. Opiate use (60 MME/Day)     Problems updated and reviewed during this visit: No problems updated.  Problem-specific Plan(s): No problem-specific assessment & plan notes found for this encounter.  No new assessment & plan notes have been filed under this hospital service since the last note was generated. Service: Pain  Management   Plan of Care   Problem List Items Addressed This Visit      High   Chronic pain - Primary (Chronic)   Relevant Medications   Oxycodone HCl 10 MG TABS   Failed back surgical syndrome 2 (Chronic)   Relevant Medications   Oxycodone HCl 10 MG TABS   Other Relevant Orders   MR Lumbar Spine Wo Contrast   LUMBAR EPIDURAL STEROID INJECTION   Lumbar facet syndrome (Bilateral) (L>R) (Chronic)   Relevant Medications   Oxycodone HCl 10 MG TABS   Other Relevant Orders   LUMBAR FACET(MEDIAL BRANCH NERVE BLOCK) MBNB   Lumbar spondylosis (Chronic)   Relevant Medications   Oxycodone HCl 10 MG TABS   Other Relevant Orders   MR Lumbar Spine Wo Contrast   LUMBAR EPIDURAL STEROID INJECTION     Medium   Encounter for therapeutic drug level monitoring   Long term current use of opiate analgesic (Chronic)   Opiate use (60 MME/Day) (Chronic)   Relevant Medications   Naloxone HCl (NARCAN) 4 MG/0.1ML LIQD       Pharmacotherapy (Medications Ordered): Meds ordered this encounter  Medications  . Oxycodone HCl 10 MG TABS    Sig: Take 1 tablet (10 mg total) by mouth every 6 (six) hours as needed.    Dispense:  120 tablet    Refill:  0    Do not place this medication, or any other prescription from our practice, on "Automatic Refill". Patient may have prescription filled one day early if pharmacy is closed on scheduled refill date. Do not fill until: 11/02/15 To last until: 12/02/15  . Naloxone HCl (NARCAN) 4 MG/0.1ML LIQD    Sig: Spray into one nostril. Repeat with second device into other nostril after 2-3 minutes if no or minimal response.    Dispense:  2 each    Refill:  0    Narcan Nasal Spray. (2 pack) Please provide the patient with clear instructions on the use of this device/medication.  Lab-work & Procedure Ordered: Orders Placed This Encounter  Procedures  . LUMBAR EPIDURAL STEROID INJECTION  . LUMBAR FACET(MEDIAL BRANCH NERVE BLOCK) MBNB  . MR Lumbar Spine Wo  Contrast    Imaging Ordered: MR LUMBAR SPINE WO CONTRAST  Interventional Therapies: Scheduled:  None at this time.    Considering:   1. Caudal epidural steroid injection under fluoroscopic guidance and IV sedation for the lumbar radicular pain.  2. Lumbar facet block for the low back pain.    PRN Procedures:   1. Caudal epidural steroid injection under fluoroscopic guidance and IV sedation for the lumbar radicular pain (leg pain). 2. Diagnostic bilateral lumbar facet block under fluoroscopic guidance and IV sedation for the low back pain.    Referral(s) or Consult(s): None at this time.  New Prescriptions   NALOXONE HCL (NARCAN) 4 MG/0.1ML LIQD    Spray into one nostril. Repeat with second device into other nostril after 2-3 minutes if no or minimal response.    Medications administered during this visit: Mr. Grimstead had no medications administered during this visit.  Requested PM Follow-up: Return in about 4 weeks (around 11/21/2015) for Medication Management, (1-Mo), Procedure (PRN - Patient will call).  Future Appointments Date Time Provider [place]  11/03/2015 8:00 AM [facility]  11/03/2015 9:00 AM [facility]  11/21/2015 8:40 AM Milinda Pointer, MD [facility] None  06/19/2016 11:00 AM Nori Riis, PA-C [facility] None    Primary Care Physician: Keith Rake, MD Location: [facility] Note by: Kathlen Brunswick. Dossie Arbour, M.D, DABA, DABAPM, DABPM, DABIPP, FIPP  Pain Score Disclaimer: We use the NRS-11 scale. This is a self-reported, subjective measurement of pain severity with only modest accuracy. It is used primarily to identify changes within a particular patient. It must be understood that outpatient pain scales are significantly less accurate that those used for research, where they can be applied under ideal controlled circumstances with minimal exposure to variables. In reality, the score is likely to be a  combination of pain intensity and pain affect, where pain affect describes the degree of emotional arousal or changes in action readiness caused by the sensory experience of pain. Factors such as social and work situation, setting, emotional state, anxiety levels, expectation, and prior pain experience may influence pain perception and show large inter-individual differences that may also be affected by time variables.  Patient instructions provided during this appointment: Patient Instructions  Naloxone nasal spray What is this medicine? NALOXONE (nal OX one) is a narcotic blocker. It is used to treat narcotic drug overdose. This medicine may be used for other purposes; ask your health care provider or pharmacist if you have questions. What should I tell my health care provider before I take this medicine? They need to know if you have any of these conditions: -drug abuse or addiction -heart disease -an unusual or allergic reaction to naloxone, other medicines, foods, dyes, or preservatives -pregnant or trying to get pregnant -breast-feeding How should I use this medicine? This medicine is for use in the nose. Lay the person on their back. Support their neck with your hand and allow the head to tilt back before giving the medicine. The nasal spray should be given into 1 nostril. After giving the medicine, move the person onto their side. Do not remove or test the nasal spray until ready to use. Get emergency medical help right away after giving the first dose of this medicine, even if the person wakes up.  You should be familiar with how to recognize the signs and symptoms of a narcotic overdose. If more doses are needed, give the additional dose in the other nostril. Talk to your pediatrician regarding the use of this medicine in children. While this drug may be prescribed for children as young as newborns for selected conditions, precautions do apply. Overdosage: If you think you have taken too  much of this medicine contact a poison control center or emergency room at once. NOTE: This medicine is only for you. Do not share this medicine with others. What if I miss a dose? This does not apply. What may interact with this medicine? This medicine is only used during an emergency. No interactions are expected during emergency use. This list may not describe all possible interactions. Give your health care provider a list of all the medicines, herbs, non-prescription drugs, or dietary supplements you use. Also tell them if you smoke, drink alcohol, or use illegal drugs. Some items may interact with your medicine. What should I watch for while using this medicine? Keep this medicine ready for use in the case of a narcotic overdose. Make sure that you have the phone number of your doctor or health care professional and local hospital ready. You may need to have additional doses of this medicine. Each nasal spray contains a single dose. Some emergencies may require additional doses. After use, bring the treated person to the nearest hospital or call 911. Make sure the treating health care professional knows that the person has received an injection of this medicine. You will receive additional instructions on what to do during and after use of this medicine before an emergency occurs. What side effects may I notice from receiving this medicine? Side effects that you should report to your doctor or health care professional as soon as possible: -allergic reactions like skin rash, itching or hives, swelling of the face, lips, or tongue -breathing problems -fast, irregular heartbeat -high blood pressure -pain that was controlled by narcotic pain medicine -seizures Side effects that usually do not require medical attention (report these to your doctor or health care professional if they continue or are bothersome): -anxious -chills -diarrhea -fever -headache -muscle pain -nausea,  vomiting -nose irritation like dryness, congestion, and swelling -sweating This list may not describe all possible side effects. Call your doctor for medical advice about side effects. You may report side effects to FDA at 1-800-FDA-1088. Where should I keep my medicine? Keep out of the reach of children. Store between 4 and 40 degrees C (39 and 104 degrees F). Do not freeze. Throw away any unused medicine after the expiration date. Keep in original box until ready to use. NOTE: This sheet is a summary. It may not cover all possible information. If you have questions about this medicine, talk to your doctor, pharmacist, or health care provider.    2016, Elsevier/Gold Standard. (2014-05-14 14:24:03)

## 2015-10-24 NOTE — Patient Instructions (Signed)
Naloxone nasal spray What is this medicine? NALOXONE (nal OX one) is a narcotic blocker. It is used to treat narcotic drug overdose. This medicine may be used for other purposes; ask your health care provider or pharmacist if you have questions. What should I tell my health care provider before I take this medicine? They need to know if you have any of these conditions: -drug abuse or addiction -heart disease -an unusual or allergic reaction to naloxone, other medicines, foods, dyes, or preservatives -pregnant or trying to get pregnant -breast-feeding How should I use this medicine? This medicine is for use in the nose. Lay the person on their back. Support their neck with your hand and allow the head to tilt back before giving the medicine. The nasal spray should be given into 1 nostril. After giving the medicine, move the person onto their side. Do not remove or test the nasal spray until ready to use. Get emergency medical help right away after giving the first dose of this medicine, even if the person wakes up. You should be familiar with how to recognize the signs and symptoms of a narcotic overdose. If more doses are needed, give the additional dose in the other nostril. Talk to your pediatrician regarding the use of this medicine in children. While this drug may be prescribed for children as young as newborns for selected conditions, precautions do apply. Overdosage: If you think you have taken too much of this medicine contact a poison control center or emergency room at once. NOTE: This medicine is only for you. Do not share this medicine with others. What if I miss a dose? This does not apply. What may interact with this medicine? This medicine is only used during an emergency. No interactions are expected during emergency use. This list may not describe all possible interactions. Give your health care provider a list of all the medicines, herbs, non-prescription drugs, or dietary  supplements you use. Also tell them if you smoke, drink alcohol, or use illegal drugs. Some items may interact with your medicine. What should I watch for while using this medicine? Keep this medicine ready for use in the case of a narcotic overdose. Make sure that you have the phone number of your doctor or health care professional and local hospital ready. You may need to have additional doses of this medicine. Each nasal spray contains a single dose. Some emergencies may require additional doses. After use, bring the treated person to the nearest hospital or call 911. Make sure the treating health care professional knows that the person has received an injection of this medicine. You will receive additional instructions on what to do during and after use of this medicine before an emergency occurs. What side effects may I notice from receiving this medicine? Side effects that you should report to your doctor or health care professional as soon as possible: -allergic reactions like skin rash, itching or hives, swelling of the face, lips, or tongue -breathing problems -fast, irregular heartbeat -high blood pressure -pain that was controlled by narcotic pain medicine -seizures Side effects that usually do not require medical attention (report these to your doctor or health care professional if they continue or are bothersome): -anxious -chills -diarrhea -fever -headache -muscle pain -nausea, vomiting -nose irritation like dryness, congestion, and swelling -sweating This list may not describe all possible side effects. Call your doctor for medical advice about side effects. You may report side effects to FDA at 1-800-FDA-1088. Where should I keep my   medicine? Keep out of the reach of children. Store between 4 and 40 degrees C (39 and 104 degrees F). Do not freeze. Throw away any unused medicine after the expiration date. Keep in original box until ready to use. NOTE: This sheet is a summary.  It may not cover all possible information. If you have questions about this medicine, talk to your doctor, pharmacist, or health care provider.    2016, [employer]. (2014-05-14 14:24:03)  

## 2015-11-01 ENCOUNTER — Telehealth: Payer: Self-pay | Admitting: Pain Medicine

## 2015-11-01 NOTE — Telephone Encounter (Signed)
[facility] is pharmacy and patient is claustrophobic, can something be called in for Thursday MRI please call patient asap

## 2015-11-01 NOTE — Telephone Encounter (Signed)
Please advise 

## 2015-11-03 ENCOUNTER — Ambulatory Visit
Admission: RE | Admit: 2015-11-03 | Discharge: 2015-11-03 | Disposition: A | Discharge status: Home / Self Care | Payer: Medicare Other | Source: Ambulatory Visit | Attending: Pain Medicine | Admitting: Pain Medicine

## 2015-11-03 DIAGNOSIS — M5416 Radiculopathy, lumbar region: Secondary | ICD-10-CM | POA: Insufficient documentation

## 2015-11-03 DIAGNOSIS — M7138 Other bursal cyst, other site: Secondary | ICD-10-CM | POA: Insufficient documentation

## 2015-11-03 DIAGNOSIS — M4806 Spinal stenosis, lumbar region: Secondary | ICD-10-CM | POA: Insufficient documentation

## 2015-11-03 DIAGNOSIS — M2578 Osteophyte, vertebrae: Secondary | ICD-10-CM | POA: Diagnosis not present

## 2015-11-03 DIAGNOSIS — G8929 Other chronic pain: Secondary | ICD-10-CM | POA: Diagnosis not present

## 2015-11-03 DIAGNOSIS — M4722 Other spondylosis with radiculopathy, cervical region: Secondary | ICD-10-CM | POA: Diagnosis not present

## 2015-11-03 DIAGNOSIS — M545 Low back pain: Secondary | ICD-10-CM

## 2015-11-03 DIAGNOSIS — M5412 Radiculopathy, cervical region: Secondary | ICD-10-CM

## 2015-11-03 DIAGNOSIS — M47812 Spondylosis without myelopathy or radiculopathy, cervical region: Secondary | ICD-10-CM | POA: Diagnosis not present

## 2015-11-03 DIAGNOSIS — M5116 Intervertebral disc disorders with radiculopathy, lumbar region: Secondary | ICD-10-CM | POA: Diagnosis not present

## 2015-11-21 ENCOUNTER — Encounter: Payer: Self-pay | Admitting: Pain Medicine

## 2015-11-21 ENCOUNTER — Ambulatory Visit: Payer: Medicare Other | Attending: Pain Medicine | Admitting: Pain Medicine

## 2015-11-21 VITALS — BP 132/69 | HR 69 | Temp 98.1°F | Resp 14 | Ht 67.0 in | Wt 195.0 lb

## 2015-11-21 DIAGNOSIS — M069 Rheumatoid arthritis, unspecified: Secondary | ICD-10-CM | POA: Insufficient documentation

## 2015-11-21 DIAGNOSIS — G8929 Other chronic pain: Secondary | ICD-10-CM | POA: Diagnosis not present

## 2015-11-21 DIAGNOSIS — M5416 Radiculopathy, lumbar region: Secondary | ICD-10-CM

## 2015-11-21 DIAGNOSIS — M549 Dorsalgia, unspecified: Secondary | ICD-10-CM | POA: Diagnosis present

## 2015-11-21 DIAGNOSIS — E876 Hypokalemia: Secondary | ICD-10-CM | POA: Insufficient documentation

## 2015-11-21 DIAGNOSIS — M25511 Pain in right shoulder: Secondary | ICD-10-CM | POA: Insufficient documentation

## 2015-11-21 DIAGNOSIS — Z5181 Encounter for therapeutic drug level monitoring: Secondary | ICD-10-CM

## 2015-11-21 DIAGNOSIS — R109 Unspecified abdominal pain: Secondary | ICD-10-CM | POA: Diagnosis not present

## 2015-11-21 DIAGNOSIS — M5442 Lumbago with sciatica, left side: Secondary | ICD-10-CM | POA: Insufficient documentation

## 2015-11-21 DIAGNOSIS — G5603 Carpal tunnel syndrome, bilateral upper limbs: Secondary | ICD-10-CM | POA: Insufficient documentation

## 2015-11-21 DIAGNOSIS — I10 Essential (primary) hypertension: Secondary | ICD-10-CM | POA: Diagnosis not present

## 2015-11-21 DIAGNOSIS — Z87442 Personal history of urinary calculi: Secondary | ICD-10-CM | POA: Insufficient documentation

## 2015-11-21 DIAGNOSIS — R079 Chest pain, unspecified: Secondary | ICD-10-CM | POA: Insufficient documentation

## 2015-11-21 DIAGNOSIS — M25512 Pain in left shoulder: Secondary | ICD-10-CM

## 2015-11-21 DIAGNOSIS — M2578 Osteophyte, vertebrae: Secondary | ICD-10-CM | POA: Diagnosis not present

## 2015-11-21 DIAGNOSIS — R937 Abnormal findings on diagnostic imaging of other parts of musculoskeletal system: Secondary | ICD-10-CM | POA: Insufficient documentation

## 2015-11-21 DIAGNOSIS — M25519 Pain in unspecified shoulder: Secondary | ICD-10-CM | POA: Diagnosis present

## 2015-11-21 DIAGNOSIS — M47816 Spondylosis without myelopathy or radiculopathy, lumbar region: Secondary | ICD-10-CM | POA: Diagnosis not present

## 2015-11-21 DIAGNOSIS — Z79891 Long term (current) use of opiate analgesic: Secondary | ICD-10-CM | POA: Diagnosis not present

## 2015-11-21 DIAGNOSIS — M545 Low back pain, unspecified: Secondary | ICD-10-CM

## 2015-11-21 DIAGNOSIS — M48061 Spinal stenosis, lumbar region without neurogenic claudication: Secondary | ICD-10-CM

## 2015-11-21 DIAGNOSIS — E1140 Type 2 diabetes mellitus with diabetic neuropathy, unspecified: Secondary | ICD-10-CM | POA: Insufficient documentation

## 2015-11-21 DIAGNOSIS — K219 Gastro-esophageal reflux disease without esophagitis: Secondary | ICD-10-CM | POA: Insufficient documentation

## 2015-11-21 DIAGNOSIS — Z9049 Acquired absence of other specified parts of digestive tract: Secondary | ICD-10-CM | POA: Diagnosis not present

## 2015-11-21 DIAGNOSIS — E7800 Pure hypercholesterolemia, unspecified: Secondary | ICD-10-CM | POA: Diagnosis not present

## 2015-11-21 DIAGNOSIS — D291 Benign neoplasm of prostate: Secondary | ICD-10-CM | POA: Diagnosis not present

## 2015-11-21 DIAGNOSIS — M4806 Spinal stenosis, lumbar region: Secondary | ICD-10-CM | POA: Insufficient documentation

## 2015-11-21 DIAGNOSIS — E781 Pure hyperglyceridemia: Secondary | ICD-10-CM | POA: Diagnosis not present

## 2015-11-21 MED ORDER — OXYCODONE HCL 10 MG PO TABS: 10.0000 mg | ORAL_TABLET | Freq: Four times a day (QID) | ORAL | Status: DC | PRN

## 2015-11-21 NOTE — Progress Notes (Signed)
Pill count: Oxycodone 47/120 ;filled 11/02/15

## 2015-11-21 NOTE — Assessment & Plan Note (Signed)
CLINICAL DATA: Chronic radicular back pain. Left leg pain and numbness. EXAM: MRI LUMBAR SPINE WITHOUT CONTRAST TECHNIQUE: Multiplanar, multisequence MR imaging of the lumbar spine was performed. No intravenous contrast was administered. COMPARISON: Lumbar radiographs 09/05/2015 FINDINGS: Segmentation: Normal lumbar segmentation. Lowest disc space L5-S1. Alignment: Slight anterior slip L4-5. Remaining alignment normal. Vertebrae: Negative for fracture or mass lesion. Conus medullaris: Extends to the T12-L1 level and appears normal. Paraspinal and other soft tissues: Paraspinous muscles normal. No retroperitoneal mass or adenopathy. No aortic aneurysm. Disc levels: L1-2: Small left lateral disc and osteophyte complex with mild displacement of the left L1 nerve root. No significant spinal stenosis L2-3: Small left lateral disc and osteophyte complex with mild displacement of the left L2 nerve root. Mild facet degeneration. Mild spinal stenosis. L3-4: Diffuse bulging of the disc with bilateral facet hypertrophy. Moderate spinal stenosis. L4-5: 2 mm anterior slip. Diffuse disc bulging. Moderate facet hypertrophy bilaterally with moderate spinal stenosis. Subarticular and foraminal stenosis on the left. 6 mm synovial cyst in the posterior spinal canal on the left just above the L5-S1 disc space. This may be arising from the L4-5 facet joint. This appears to be causing impingement of the left S1 nerve root. L5-S1: Normal disc space. No significant facet degeneration. IMPRESSION: Small left lateral disc and osteophyte complex at L1-2 and L2-3 Moderate spinal stenosis L3-4 Moderate spinal stenosis L4-5. Subarticular and foraminal stenosis on the left. 6 mm synovial cyst on the left between the L4-5 and L5-1 disc spaces. This is causing impingement of the left S1 nerve root. This is most likely arising from the L4-5 facet joint on left given the amount of degenerative change and fluid in the facet  joint. The left L5-S1 facet joint is not degenerated.

## 2015-11-21 NOTE — Progress Notes (Signed)
Patient's Name: Howard Murphy  Patient type: Established  MRN: [id number]  Service setting: Ambulatory outpatient  DOB: 31-Jul-1955  Location: [facility]  DOS: 11/21/2015  Primary Care Physician: Keith Rake, MD  Note by: Kathlen Brunswick. Dossie Arbour, M.D, DABA, Sarita Haver, DABPM, Milagros Evener, FIPP  Referring Physician: Roselee Nova, MD  Specialty: Board-Certified Interventional Pain Management  Last Visit to Pain Management: 11/01/2015   Primary Reason(s) for Visit: Encounter for prescription drug management (Level of risk: moderate) CC: Shoulder Pain and Back Pain   HPI  Howard Murphy is a 60 y.o. year old, male patient, who returns today as an established patient. 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); Elevated hemoglobin ([place]); Acid reflux; BP (high blood pressure); Hypercholesteremia; HLD (hyperlipidemia); Arthritis or polyarthritis, rheumatoid ([place]); History of lumbar facet Synovial cyst, surgically removed.; Rheumatoid arthritis ([place]); Breath shortness; Dyspnea; Chronic abdominal pain (RUQ); Type 2 diabetes mellitus with peripheral neuropathy ([place]); Chest pain; Hypertriglyceridemia; Chronic shoulder pain (Right); Chronic pain; Opiate use (60 MME/Day); Long term prescription opiate use; Long term current use of opiate analgesic; Chronic low back pain; Lumbar spondylosis; Lumbar facet syndrome (Bilateral) (L>R); Chronic hip pain (Left); Chronic sacroiliac joint pain (Left); Diabetic peripheral neuropathy ([place]); Chronic lumbar radicular pain (S1 Dermatome) (Bilateral) (R>L); Hypokalemia; Complete tear of the shoulder rotator cuff (Left); Chronic shoulder pain (Location of Primary Source of Pain) (Bilateral) (L>R); Non-insulin dependent type 2 diabetes mellitus ([place]); Chronic foot pain (Location of Secondary source of pain) (Bilateral) (L>R); Chronic hand pain (Location of Tertiary source of pain) (Bilateral) (R>L); Lumbar facet hypertrophy;  Encounter for therapeutic drug level monitoring; Encounter for pain management planning; Low testosterone; Occipital pain (Right); Failed back surgical syndrome 2; Diuretic-induced hypokalemia; Chronic low back pain with left-sided sciatica; Radicular pain of shoulder (Right); Carpal tunnel syndrome, bilateral; Chronic radicular cervical pain; and Abnormal MRI, lumbar spine on his problem list.. His primarily concern today is the Shoulder Pain and Back Pain   Pain Assessment: Self-Reported Pain Score: 5  Reported level is compatible with observation Pain Type: Chronic pain Pain Location: Shoulder Pain Orientation: Left Pain Descriptors / Indicators: Sharp, Shooting Pain Frequency: Intermittent  The patient comes into the clinics today for pharmacological management of his chronic pain. I last saw this patient on 11/01/2015. The patient  reports that he does not use illicit drugs. His body mass index is 30.53 kg/(m^2).  Date of Last Visit: 10/24/15 Service Provided on Last Visit: Med Refill  Controlled Substance Pharmacotherapy Assessment & REMS (Risk Evaluation and Mitigation Strategy)  Analgesic: Oxycodone IR 10 mg every 6 hours (40 mg/day of oxycodone) Pill Count: Oxycodone 47/120 ;filled 11/02/15 MME/day: 60 mg/day Pharmacokinetics: Onset of action (Liberation/Absorption): Within expected pharmacological parameters Time to Peak effect (Distribution): Timing and results are as within normal expected parameters Duration of action (Metabolism/Excretion): Within normal limits for medication Pharmacodynamics: Analgesic Effect: More than 50% Activity Facilitation: Medication(s) allow patient to sit, stand, walk, and do the basic ADLs Perceived Effectiveness: Described as relatively effective, allowing for increase in activities of daily living (ADL) Side-effects or Adverse reactions: None reported Monitoring: [place] PMP: Online review of the past 34-month period conducted. Compliant with  practice rules and regulations Last UDS on record: TOXASSURE SELECT 13  Date Value Ref Range Status  10/03/2015 FINAL  Final    Comment:    ==================================================================== TOXASSURE SELECT 13 (MW) ==================================================================== Test  Result       Flag       Units Drug Present not Declared for Prescription Verification   Desmethyldiazepam              172          UNEXPECTED ng/mg creat   Oxazepam                       177          UNEXPECTED ng/mg creat   Temazepam                      186          UNEXPECTED ng/mg creat    Desmethyldiazepam, oxazepam, and temazepam are benzodiazepine    drugs, but may also be present as common metabolites of other    benzodiazepine drugs, including diazepam.   Alpha-hydroxyalprazolam        123          UNEXPECTED ng/mg creat    Alpha-hydroxyalprazolam is an expected metabolite of alprazolam.    Source of alprazolam is a scheduled prescription medication. Drug Absent but Declared for Prescription Verification   Oxycodone                      Not Detected UNEXPECTED ng/mg creat ==================================================================== Test                      Result    Flag   Units      Ref Range   Creatinine              71               mg/dL      >=20 ==================================================================== Declared Medications:  The flagging and interpretation on this report are based on the  following declared medications.  Unexpected results may arise from  inaccuracies in the declared medications.  **Note: The testing scope of this panel includes these medications:  Oxycodone  **Note: The testing scope of this panel does not include following  reported medications:  Amlodipine (Norvasc)  Chlorthalidone  Lisinopril  Omeprazole (Prilosec)  Ondansetron (Zofran)  Potassium (K-Dur)  Potassium (Klor-Con)  Rosuvastatin  (Crestor)  Sitagliptin (Januvia)  Sucralfate (Carafate)  Testosterone (Depo-Testosterone) ==================================================================== For clinical consultation, please call [phone]. ====================================================================    UDS interpretation: Unexpected findings not considered significantly abnormal. This is very likely due to the fact that he might have been schedule to come back after he ran out of medication. Today we will retest him since he did have some medication left on this visit. We have also reminded the patient about the CDC guidelines involving the use of benzodiazepines and opioids and how this increases the risk of respiratory depression and death. He understood and accepted. Medication Assessment Form: Reviewed. Patient indicates being compliant with therapy Treatment compliance: Compliant Risk Assessment: Aberrant Behavior: None observed today Substance Use Disorder (SUD) Risk Level: High Risk of opioid abuse or dependence: 0.7-3.0% with doses ? 36 MME/day and 6.1-26% with doses ? 120 MME/day. Opioid Risk Tool (ORT) Score: Total Score: 0 Low Risk for SUD (Score <3) Depression Scale Score: PHQ-2: PHQ-2 Total Score: 0 No depression (0) PHQ-9: PHQ-9 Total Score: 0 No depression (0-4)  Pharmacologic Plan: No change in therapy, at this time  Laboratory Chemistry  Inflammation Markers Lab Results  Component Value Date   ESRSEDRATE 1 09/05/2015  CRP 0.6 09/05/2015    Renal Function Lab Results  Component Value Date   BUN 12 09/06/2015   CREATININE 1.13 09/06/2015   GFRAA 81 09/06/2015   GFRNONAA 70 09/06/2015    Hepatic Function Lab Results  Component Value Date   AST 38 09/05/2015   ALT 46 09/05/2015   ALBUMIN 4.6 09/05/2015    Electrolytes Lab Results  Component Value Date   NA 141 09/06/2015   K 3.9 09/06/2015   CL 94* 09/06/2015   CALCIUM 10.2 09/06/2015   MG 2.1 09/05/2015    Pain  Modulating Vitamins No results found for: VD25OH, VD125OH2TOT, [id number], [id number], VITAMINB12  Coagulation Parameters Lab Results  Component Value Date   PLT 300 07/15/2015    Note: Labs Reviewed.  Recent Diagnostic Imaging  Ct Cervical Spine Wo Contrast  11/03/2015  CLINICAL DATA:  Chronic radicular neck pain with BILATERAL upper extremity weakness and numbness. EXAM: CT CERVICAL SPINE WITHOUT CONTRAST TECHNIQUE: Multidetector CT imaging of the cervical spine was performed without intravenous contrast. Multiplanar CT image reconstructions were also generated. COMPARISON:  12/17/2012. FINDINGS: Alignment is anatomic, although there is some straightening of the normal cervical lordosis. No worrisome osseous lesions. Nuchal ligament calcification. No neck masses. Lung apices not well visualized. No visible atherosclerotic calcification. The individual disc spaces were examined as follows: C2-3:  Unremarkable disc space.  Mild facet arthropathy. C3-4: Unremarkable disc space. Mild facet arthropathy and uncinate spurring on the RIGHT could affect the exiting nerve root. C4-5: Severe disc space narrowing with central disc osteophyte complex and BILATERAL uncinate spurring, RIGHT greater than LEFT. Mild stenosis. RIGHT greater than LEFT C5 nerve root impingement is likely. C5-6: Mild disc space narrowing. BILATERAL uncinate spurring without facet disease. Annular bulging in the midline. Mild stenosis. Either C6 nerve root could be affected. C6-7: Moderate disc space narrowing. BILATERAL uncinate spurring and central disc calcification. Foraminal narrowing could affect either C7 nerve root. C7-T1:  Unremarkable. IMPRESSION: Multilevel spondylosis as described. When compared with 2014, similar appearance. Electronically Signed   By: Staci Righter M.D.   On: 11/03/2015 09:37   Mr Lumbar Spine Wo Contrast  11/03/2015  CLINICAL DATA:  Chronic radicular back pain. Left leg pain and numbness. EXAM: MRI LUMBAR  SPINE WITHOUT CONTRAST TECHNIQUE: Multiplanar, multisequence MR imaging of the lumbar spine was performed. No intravenous contrast was administered. COMPARISON:  Lumbar radiographs 09/05/2015 FINDINGS: Segmentation:  Normal lumbar segmentation.  Lowest disc space L5-S1. Alignment:  Slight anterior slip L4-5.  Remaining alignment normal. Vertebrae:  Negative for fracture or mass lesion. Conus medullaris: Extends to the T12-L1 level and appears normal. Paraspinal and other soft tissues: Paraspinous muscles normal. No retroperitoneal mass or adenopathy. No aortic aneurysm. Disc levels: L1-2: Small left lateral disc and osteophyte complex with mild displacement of the left L1 nerve root. No significant spinal stenosis L2-3: Small left lateral disc and osteophyte complex with mild displacement of the left L2 nerve root. Mild facet degeneration. Mild spinal stenosis. L3-4: Diffuse bulging of the disc with bilateral facet hypertrophy. Moderate spinal stenosis. L4-5: 2 mm anterior slip. Diffuse disc bulging. Moderate facet hypertrophy bilaterally with moderate spinal stenosis. Subarticular and foraminal stenosis on the left. 6 mm synovial cyst in the posterior spinal canal on the left just above the L5-S1 disc space. This may be arising from the L4-5 facet joint. This appears to be causing impingement of the left S1 nerve root. L5-S1:  Normal disc space.  No significant facet degeneration. IMPRESSION: Small left lateral disc  and osteophyte complex at L1-2 and L2-3 Moderate spinal stenosis L3-4 Moderate spinal stenosis L4-5. Subarticular and foraminal stenosis on the left. 6 mm synovial cyst on the left between the L4-5 and L5-1 disc spaces. This is causing impingement of the left S1 nerve root. This is most likely arising from the L4-5 facet joint on left given the amount of degenerative change and fluid in the facet joint. The left L5-S1 facet joint is not degenerated. Electronically Signed   By: Franchot Gallo M.D.   On:  11/03/2015 08:55    Meds  The patient has a current medication list which includes the following prescription(s): chlorthalidone, cyanocobalamin, lisinopril, naloxone hcl, omeprazole, ondansetron, oxycodone hcl, rosuvastatin, sitagliptin, sucralfate, testosterone cypionate, amlodipine, and icosapent ethyl.  Current Outpatient Prescriptions on File Prior to Visit  Medication Sig  . chlorthalidone (HYGROTON) 50 MG tablet   . cyanocobalamin ([employer] VITAMIN B12) 2000 MCG tablet Take 2,000 mcg by mouth daily.  Marland Kitchen lisinopril (PRINIVIL,ZESTRIL) 20 MG tablet Take 20 mg by mouth daily. Reported on 10/24/2015  . Naloxone HCl (NARCAN) 4 MG/0.1ML LIQD Spray into one nostril. Repeat with second device into other nostril after 2-3 minutes if no or minimal response.  Marland Kitchen omeprazole (PRILOSEC) 40 MG capsule Take 40 mg by mouth 2 (two) times daily.  . ondansetron (ZOFRAN-ODT) 4 MG disintegrating tablet Take 4 mg by mouth every 8 (eight) hours as needed for nausea or vomiting.  . sitaGLIPtin (JANUVIA) 50 MG tablet Take 1 tablet (50 mg total) by mouth daily.  . sucralfate (CARAFATE) 1 G tablet Take 1 tablet by mouth 4 (four) times daily.  Marland Kitchen testosterone cypionate (DEPOTESTOSTERONE CYPIONATE) 200 MG/ML injection Inject 200 mg into the muscle every 14 (fourteen) days.  Marland Kitchen amLODipine (NORVASC) 5 MG tablet Take 1 tablet (5 mg total) by mouth daily. (Patient not taking: Reported on 11/21/2015)  . Icosapent Ethyl (VASCEPA) 1 g CAPS Take 1 capsule by mouth 2 (two) times daily. Reported on 11/21/2015   No current facility-administered medications on file prior to visit.    ROS  Constitutional: Denies any fever or chills Gastrointestinal: No reported hemesis, hematochezia, vomiting, or acute GI distress Musculoskeletal: Denies any acute onset joint swelling, redness, loss of ROM, or weakness Neurological: No reported episodes of acute onset apraxia, aphasia, dysarthria, agnosia, amnesia, paralysis, loss of coordination, or  loss of consciousness  Allergies  Mr. Panas has No Known Allergies.  [place]  Medical:  Mr. Stelma  has a past medical history of Diabetes mellitus without complication ([place]); Hypertension; GERD (gastroesophageal reflux disease); Hyperlipemia; Nephrolithiasis; Shortness of breath dyspnea; Arthritis; and Back ache (05/05/2013). Family: family history includes Cancer in his maternal grandmother, mother, sister, and sister; Heart disease in his father; Stroke in his father. There is no history of Bladder Cancer, Prostate cancer, or Kidney cancer. Surgical:  has past surgical history that includes Cholecystectomy (2000); Back surgery (2015); Shoulder surgery (Right, 2016); Colonoscopy with propofol (N/A, 04/11/2015); and Esophagogastroduodenoscopy (egd) with propofol (N/A, 04/11/2015). Tobacco:  reports that he has never smoked. He has never used smokeless tobacco. Alcohol:  reports that he does not drink alcohol. Drug:  reports that he does not use illicit drugs.  Constitutional Exam  Vitals: Blood pressure 132/69, pulse 69, temperature 98.1 F (36.7 C), temperature source Oral, resp. rate 14, height 5\' 7"  (1.702 m), weight 195 lb (88.451 kg), SpO2 98 %. General appearance: Well nourished, well developed, and well hydrated. In no acute distress Calculated BMI/Body habitus: Body mass index is 30.53 kg/(m^2). (  30-34.9 kg/m2) Obese (Class I) - 68% higher incidence of chronic pain Psych/Mental status: Alert and oriented x 3 (person, place, & time) Eyes: PERLA Respiratory: No evidence of acute respiratory distress  Cervical Spine Exam  Inspection: No masses, redness, or swelling Alignment: Symmetrical ROM: Functional: ROM is within functional limits [facility]) Stability: No instability detected Muscle strength & Tone: Functionally intact Sensory: Unimpaired Palpation: No complaints of tenderness  Upper Extremity (UE) Exam    Side: Right upper extremity  Side: Left upper extremity  Inspection:  No masses, redness, swelling, or asymmetry  Inspection: No masses, redness, swelling, or asymmetry  ROM:  ROM:  Functional: ROM is within functional limits [facility])  Functional: ROM is within functional limits [facility])  Muscle strength & Tone: Functionally intact  Muscle strength & Tone: Functionally intact  Sensory: Unimpaired  Sensory: Unimpaired  Palpation: Non-contributory  Palpation: Non-contributory   Thoracic Spine Exam  Inspection: No masses, redness, or swelling Alignment: Symmetrical ROM: Functional: ROM is within functional limits [facility]) Stability: No instability detected Sensory: Unimpaired Muscle strength & Tone: Functionally intact Palpation: No complaints of tenderness  Lumbar Spine Exam  Inspection: No masses, redness, or swelling Alignment: Symmetrical ROM: Functional: Decreased ROM Stability: No instability detected Muscle strength & Tone: Functionally intact Sensory: Unimpaired Palpation: Tender Provocative Tests: Lumbar Hyperextension and rotation test: Positive bilaterally for lumbar facet joint pain. Patrick's Maneuver: deferred  Gait & Posture Assessment  Ambulation: Unassisted Gait: Unaffected Posture: WNL  Lower Extremity Exam    Side: Right lower extremity  Side: Left lower extremity  Inspection: No masses, redness, swelling, or asymmetry ROM:  Inspection: No masses, redness, swelling, or asymmetry ROM:  Functional: ROM is within functional limits [facility])  Functional: ROM is within functional limits [facility])  Muscle strength & Tone: Functionally intact  Muscle strength & Tone: Functionally intact  Sensory: Unimpaired  Sensory: Unimpaired  Palpation: Non-contributory  Palpation: Non-contributory   Assessment & Plan  Primary Diagnosis & Pertinent Problem List: The primary encounter diagnosis was Chronic pain. 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  Visit Diagnosis: 1. Chronic pain   2. Encounter for therapeutic drug level monitoring   3. Long term current use of opiate analgesic   4. Chronic shoulder pain (Location of Primary Source of Pain) (Bilateral) (L>R)   5. Chronic lumbar radicular pain (S1 Dermatome) (Bilateral) (R>L)   6. Chronic low back pain   7. Abnormal MRI, lumbar spine   8. 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 Lumbar facet syndrome (Bilateral) (L>R)     Problems updated and reviewed during this visit: Problem  Abnormal Mri, Lumbar Spine   CLINICAL DATA: Chronic radicular back pain. Left leg pain and numbness. EXAM: MRI LUMBAR SPINE WITHOUT CONTRAST TECHNIQUE: Multiplanar, multisequence MR imaging of the lumbar spine was performed. No intravenous contrast was administered. COMPARISON: Lumbar radiographs 09/05/2015 FINDINGS: Segmentation: Normal lumbar segmentation. Lowest disc space L5-S1. Alignment: Slight anterior slip L4-5. Remaining alignment normal. Vertebrae: Negative for fracture or mass lesion. Conus medullaris: Extends to the T12-L1 level and appears normal. Paraspinal and other soft tissues: Paraspinous muscles normal. No retroperitoneal mass or adenopathy. No aortic aneurysm. Disc levels: L1-2: Small left lateral  disc and osteophyte complex with mild displacement of the left L1 nerve root. No significant spinal stenosis L2-3: Small left lateral disc and osteophyte complex with mild displacement of the left L2 nerve root. Mild facet degeneration. Mild spinal stenosis. L3-4: Diffuse bulging of the disc with bilateral facet hypertrophy. Moderate spinal stenosis. L4-5: 2 mm anterior slip. Diffuse disc bulging.  Moderate facet hypertrophy bilaterally with moderate spinal stenosis. Subarticular and foraminal stenosis on the left. 6 mm synovial cyst in the posterior spinal canal on the left just above the L5-S1 disc space. This may be arising from the L4-5 facet joint. This appears to be causing impingement of the left S1 nerve root. L5-S1: Normal disc space. No significant facet degeneration. IMPRESSION: Small left lateral disc and osteophyte complex at L1-2 and L2-3 Moderate spinal stenosis L3-4 Moderate spinal stenosis L4-5. Subarticular and foraminal stenosis on the left. 6 mm synovial cyst on the left between the L4-5 and L5-1 disc spaces. This is causing impingement of the left S1 nerve root. This is most likely arising from the L4-5 facet joint on left given the amount of degenerative change and fluid in the facet joint. The left L5-S1 facet joint is not degenerated.     Problem-specific Plan(s): Abnormal MRI, lumbar spine CLINICAL DATA: Chronic radicular back pain. Left leg pain and numbness. EXAM: MRI LUMBAR SPINE WITHOUT CONTRAST TECHNIQUE: Multiplanar, multisequence MR imaging of the lumbar spine was performed. No intravenous contrast was administered. COMPARISON: Lumbar radiographs 09/05/2015 FINDINGS: Segmentation: Normal lumbar segmentation. Lowest disc space L5-S1. Alignment: Slight anterior slip L4-5. Remaining alignment normal. Vertebrae: Negative for fracture or mass lesion. Conus medullaris: Extends to the T12-L1 level and appears normal. Paraspinal and other soft tissues: Paraspinous muscles normal. No retroperitoneal mass or adenopathy. No aortic aneurysm. Disc levels: L1-2: Small left lateral disc and osteophyte complex with mild displacement of the left L1 nerve root. No significant spinal stenosis L2-3: Small left lateral disc and osteophyte complex with mild displacement of the left L2 nerve root. Mild facet degeneration. Mild spinal stenosis. L3-4: Diffuse bulging of  the disc with bilateral facet hypertrophy. Moderate spinal stenosis. L4-5: 2 mm anterior slip. Diffuse disc bulging. Moderate facet hypertrophy bilaterally with moderate spinal stenosis. Subarticular and foraminal stenosis on the left. 6 mm synovial cyst in the posterior spinal canal on the left just above the L5-S1 disc space. This may be arising from the L4-5 facet joint. This appears to be causing impingement of the left S1 nerve root. L5-S1: Normal disc space. No significant facet degeneration. IMPRESSION: Small left lateral disc and osteophyte complex at L1-2 and L2-3 Moderate spinal stenosis L3-4 Moderate spinal stenosis L4-5. Subarticular and foraminal stenosis on the left. 6 mm synovial cyst on the left between the L4-5 and L5-1 disc spaces. This is causing impingement of the left S1 nerve root. This is most likely arising from the L4-5 facet joint on left given the amount of degenerative change and fluid in the facet joint. The left L5-S1 facet joint is not degenerated.   No new assessment & plan notes have been filed under this hospital service since the last note was generated. Service: Pain Management   Plan of Care   Problem List Items Addressed This Visit      High   Abnormal MRI, lumbar spine    CLINICAL DATA: Chronic radicular back pain. Left leg pain and numbness. EXAM: MRI LUMBAR SPINE WITHOUT CONTRAST TECHNIQUE: Multiplanar, multisequence MR imaging of the lumbar spine was performed. No intravenous contrast  was administered. COMPARISON: Lumbar radiographs 09/05/2015 FINDINGS: Segmentation: Normal lumbar segmentation. Lowest disc space L5-S1. Alignment: Slight anterior slip L4-5. Remaining alignment normal. Vertebrae: Negative for fracture or mass lesion. Conus medullaris: Extends to the T12-L1 level and appears normal. Paraspinal and other soft tissues: Paraspinous muscles normal. No retroperitoneal mass or adenopathy. No aortic aneurysm. Disc levels: L1-2:  Small left lateral disc and osteophyte complex with mild displacement of the left L1 nerve root. No significant spinal stenosis L2-3: Small left lateral disc and osteophyte complex with mild displacement of the left L2 nerve root. Mild facet degeneration. Mild spinal stenosis. L3-4: Diffuse bulging of the disc with bilateral facet hypertrophy. Moderate spinal stenosis. L4-5: 2 mm anterior slip. Diffuse disc bulging. Moderate facet hypertrophy bilaterally with moderate spinal stenosis. Subarticular and foraminal stenosis on the left. 6 mm synovial cyst in the posterior spinal canal on the left just above the L5-S1 disc space. This may be arising from the L4-5 facet joint. This appears to be causing impingement of the left S1 nerve root. L5-S1: Normal disc space. No significant facet degeneration. IMPRESSION: Small left lateral disc and osteophyte complex at L1-2 and L2-3 Moderate spinal stenosis L3-4 Moderate spinal stenosis L4-5. Subarticular and foraminal stenosis on the left. 6 mm synovial cyst on the left between the L4-5 and L5-1 disc spaces. This is causing impingement of the left S1 nerve root. This is most likely arising from the L4-5 facet joint on left given the amount of degenerative change and fluid in the facet joint. The left L5-S1 facet joint is not degenerated.      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 Oxycodone HCl 10 MG TABS    Sig: Take 1 tablet (10 mg total) by mouth every 6 (six) hours as needed.    Dispense:  120 tablet    Refill:  0    Do not place this medication, or any other prescription from our practice, on "Automatic Refill". Patient may have prescription filled one day early if pharmacy is closed on scheduled refill date. Do not fill until: 12/02/15 To last until: 01/01/16    [facility] Procedure Ordered: Orders Placed This Encounter  Procedures  . LUMBAR FACET(MEDIAL BRANCH NERVE BLOCK) MBNB  . LUMBAR EPIDURAL STEROID INJECTION  . Lumbar Transforaminal epidural without steroid  . ToxASSURE Select 13 (MW), Urine  . Ambulatory referral to Neurosurgery    Imaging Ordered: AMB REFERRAL TO NEUROSURGERY  Interventional Therapies: Scheduled:  None at this time.    Considering:   1. Left-sided L4-5 lumbar epidural steroid injection under fluoroscopic guidance, with a without sedation, for the lower extremity pain.  2. Diagnostic bilateral lumbar facet block under fluoroscopic guidance and IV sedation for the low back pain.  3. Possible lumbar facet radiofrequency ablation. Left L5-S1 and L4-5 transforaminal epidural steroid injection under fluoroscopic guidance, with a without sedation.    PRN Procedures:   1. Left-sided L4-5 lumbar epidural steroid injection under fluoroscopic guidance, with a without sedation, for the lower extremity pain.  2. Diagnostic bilateral lumbar facet block under fluoroscopic guidance and IV sedation for the low  back pain.  3. Possible lumbar facet radiofrequency ablation. Left L5-S1 and L4-5 transforaminal epidural steroid injection under fluoroscopic guidance, with a without sedation.    Referral(s) or Consult(s): None at this time.  New Prescriptions   No medications on file    Medications administered during this visit: Mr. Deforge had no medications administered during this visit.  Requested PM Follow-up: Return in about 3 weeks (around 12/12/2015) for Procedure (PRN - Patient will call), Medication Management, (1-Mo).  Future Appointments Date Time Provider [place]  12/08/2015 9:20 AM Milinda Pointer, MD [facility] None  06/19/2016 11:00 AM Pearletha Alfred None    Primary Care Physician: Keith Rake, MD Location: [facility] Note by: Kathlen Brunswick. Dossie Arbour, M.D, DABA, DABAPM, DABPM, DABIPP, FIPP  Pain Score Disclaimer: We use the NRS-11 scale. This is a self-reported, subjective measurement of pain severity with only modest accuracy. It is used primarily to identify changes within a particular patient. It must be understood that outpatient pain scales are significantly less accurate that those used for research, where they can be applied under ideal controlled circumstances with minimal exposure to variables. In reality, the score is likely to be a combination of pain intensity and pain affect, where pain affect describes the degree of emotional arousal or changes in action readiness caused by the sensory experience of pain. Factors such as social and work situation, setting, emotional state, anxiety levels, expectation, and prior pain experience may influence pain perception and show large inter-individual differences that may also be affected by time variables.  Patient instructions provided during this appointment: Patient Instructions  Epidural Steroid Injection Patient Information  Description: The epidural space surrounds the nerves  as they exit the spinal cord.  In some patients, the nerves can be compressed and inflamed by a bulging disc or a tight spinal canal (spinal stenosis).  By injecting steroids into the epidural space, we can bring irritated nerves into direct contact with a potentially helpful medication.  These steroids act directly on the irritated nerves and can reduce swelling and inflammation which often leads to decreased pain.  Epidural steroids may be injected anywhere along the spine and from the neck to the low back depending upon the location of your pain.   After numbing the skin with local anesthetic (like Novocaine), a small needle is passed into the epidural space slowly.  You may experience a sensation of pressure while this is being done.  The entire block usually last less than 10 minutes.  Conditions which may be treated by epidural steroids:   Low back and leg pain  Neck and arm pain  Spinal stenosis  Post-laminectomy syndrome  Herpes zoster (shingles) pain  Pain from compression fractures  Preparation for the injection:  4. Do not eat any solid food or dairy  products within 8 hours of your appointment.  5. You may drink clear liquids up to 3 hours before appointment.  Clear liquids include water, black coffee, juice or soda.  No milk or cream please. 6. You may take your regular medication, including pain medications, with a sip of water before your appointment  Diabetics should hold regular insulin (if taken separately) and take 1/2 normal NPH dos the morning of the procedure.  Carry some sugar containing items with you to your appointment. 7. A driver must accompany you and be prepared to drive you home after your procedure.  8. Bring all your current medications with your. 9. An IV may be inserted and sedation may be given at the discretion of the physician.   10. A blood pressure cuff, EKG and other monitors will often be applied during the procedure.  Some patients may need to have  extra oxygen administered for a short period. 11. You will be asked to provide medical information, including your allergies, prior to the procedure.  We must know immediately if you are taking blood thinners (like Coumadin/Warfarin)  Or if you are allergic to IV iodine contrast (dye). We must know if you could possible be pregnant.  Possible side-effects:  Bleeding from needle site  Infection (rare, may require surgery)  Nerve injury (rare)  Numbness & tingling (temporary)  Difficulty urinating (rare, temporary)  Spinal headache ( a headache worse with upright posture)  Light -headedness (temporary)  Pain at injection site (several days)  Decreased blood pressure (temporary)  Weakness in arm/leg (temporary)  Pressure sensation in back/neck (temporary)  Call if you experience:  Fever/chills associated with headache or increased back/neck pain.  Headache worsened by an upright position.  New onset weakness or numbness of an extremity below the injection site  Hives or difficulty breathing (go to the emergency room)  Inflammation or drainage at the infection site  Severe back/neck pain  Any new symptoms which are concerning to you  Please note:  Although the local anesthetic injected can often make your back or neck feel good for several hours after the injection, the pain will likely return.  It takes 3-7 days for steroids to work in the epidural space.  You may not notice any pain relief for at least that one week.  If effective, we will often do a series of three injections spaced 3-6 weeks apart to maximally decrease your pain.  After the initial series, we generally will wait several months before considering a repeat injection of the same type.  If you have any questions, please call [phone] [facility]  Facet Blocks Patient Information  Description: The facets are joints in the spine between the vertebrae.  Like any  joints in the body, facets can become irritated and painful.  Arthritis can also effect the facets.  By injecting steroids and local anesthetic in and around these joints, we can temporarily block the nerve supply to them.  Steroids act directly on irritated nerves and tissues to reduce selling and inflammation which often leads to decreased pain.  Facet blocks may be done anywhere along the spine from the neck to the low back depending upon the location of your pain.   After numbing the skin with local anesthetic (like Novocaine), a small needle is passed onto the facet joints under x-ray guidance.  You may experience a sensation of pressure while this is being done.  The entire block usually lasts about 15-25 minutes.  Conditions which may be treated by facet blocks:   Low back/buttock pain  Neck/shoulder pain  Certain types of headaches  Preparation for the injection:  12. Do not eat any solid food or dairy products within 8 hours of your appointment. 13. You may drink clear liquid up to 3 hours before appointment.  Clear liquids include water, black coffee, juice or soda.  No milk or cream please. 14. You may take your regular medication, including pain medications, with a sip of water before your appointment.  Diabetics should hold regular insulin (if taken separately) and take 1/2 normal NPH dose the morning of the procedure.  Carry some sugar containing items with you to your appointment. 15. A driver must accompany you and be prepared to drive you home after your procedure. 65. Bring all your current medications with you. 17. An IV may be inserted and sedation may be given at the discretion of the physician. 18. A blood pressure cuff, EKG and other monitors will often be applied during the procedure.  Some patients may need to have extra oxygen administered for a short period. 58. You will be asked to provide medical information, including your allergies and medications, prior to the  procedure.  We must know immediately if you are taking blood thinners (like Coumadin/Warfarin) or if you are allergic to IV iodine contrast (dye).  We must know if you could possible be pregnant.  Possible side-effects:   Bleeding from needle site  Infection (rare, may require surgery)  Nerve injury (rare)  Numbness & tingling (temporary)  Difficulty urinating (rare, temporary)  Spinal headache (a headache worse with upright posture)  Light-headedness (temporary)  Pain at injection site (serveral days)  Decreased blood pressure (rare, temporary)  Weakness in arm/leg (temporary)  Pressure sensation in back/neck (temporary)   Call if you experience:   Fever/chills associated with headache or increased back/neck pain  Headache worsened by an upright position  New onset, weakness or numbness of an extremity below the injection site  Hives or difficulty breathing (go to the emergency room)  Inflammation or drainage at the injection site(s)  Severe back/neck pain greater than usual  New symptoms which are concerning to you  Please note:  Although the local anesthetic injected can often make your back or neck feel good for several hours after the injection, the pain will likely return. It takes 3-7 days for steroids to work.  You may not notice any pain relief for at least one week.  If effective, we will often do a series of 2-3 injections spaced 3-6 weeks apart to maximally decrease your pain.  After the initial series, you may be a candidate for a more permanent nerve block of the facets.  If you have any questions, please call #[phone]) [facility]

## 2015-11-21 NOTE — Patient Instructions (Signed)
Epidural Steroid Injection Patient Information  Description: The epidural space surrounds the nerves as they exit the spinal cord.  In some patients, the nerves can be compressed and inflamed by a bulging disc or a tight spinal canal (spinal stenosis).  By injecting steroids into the epidural space, we can bring irritated nerves into direct contact with a potentially helpful medication.  These steroids act directly on the irritated nerves and can reduce swelling and inflammation which often leads to decreased pain.  Epidural steroids may be injected anywhere along the spine and from the neck to the low back depending upon the location of your pain.   After numbing the skin with local anesthetic (like Novocaine), a small needle is passed into the epidural space slowly.  You may experience a sensation of pressure while this is being done.  The entire block usually last less than 10 minutes.  Conditions which may be treated by epidural steroids:   Low back and leg pain  Neck and arm pain  Spinal stenosis  Post-laminectomy syndrome  Herpes zoster (shingles) pain  Pain from compression fractures  Preparation for the injection:  1. Do not eat any solid food or dairy products within 8 hours of your appointment.  2. You may drink clear liquids up to 3 hours before appointment.  Clear liquids include water, black coffee, juice or soda.  No milk or cream please. 3. You may take your regular medication, including pain medications, with a sip of water before your appointment  Diabetics should hold regular insulin (if taken separately) and take 1/2 normal NPH dos the morning of the procedure.  Carry some sugar containing items with you to your appointment. 4. A driver must accompany you and be prepared to drive you home after your procedure.  5. Bring all your current medications with your. 6. An IV may be inserted and sedation may be given at the discretion of the physician.   7. A blood pressure  cuff, EKG and other monitors will often be applied during the procedure.  Some patients may need to have extra oxygen administered for a short period. 8. You will be asked to provide medical information, including your allergies, prior to the procedure.  We must know immediately if you are taking blood thinners (like Coumadin/Warfarin)  Or if you are allergic to IV iodine contrast (dye). We must know if you could possible be pregnant.  Possible side-effects:  Bleeding from needle site  Infection (rare, may require surgery)  Nerve injury (rare)  Numbness & tingling (temporary)  Difficulty urinating (rare, temporary)  Spinal headache ( a headache worse with upright posture)  Light -headedness (temporary)  Pain at injection site (several days)  Decreased blood pressure (temporary)  Weakness in arm/leg (temporary)  Pressure sensation in back/neck (temporary)  Call if you experience:  Fever/chills associated with headache or increased back/neck pain.  Headache worsened by an upright position.  New onset weakness or numbness of an extremity below the injection site  Hives or difficulty breathing (go to the emergency room)  Inflammation or drainage at the infection site  Severe back/neck pain  Any new symptoms which are concerning to you  Please note:  Although the local anesthetic injected can often make your back or neck feel good for several hours after the injection, the pain will likely return.  It takes 3-7 days for steroids to work in the epidural space.  You may not notice any pain relief for at least that one week.    If effective, we will often do a series of three injections spaced 3-6 weeks apart to maximally decrease your pain.  After the initial series, we generally will wait several months before considering a repeat injection of the same type.  If you have any questions, please call [phone] [facility] Patient Information  Description: The facets are joints in the spine between the vertebrae.  Like any joints in the body, facets can become irritated and painful.  Arthritis can also effect the facets.  By injecting steroids and local anesthetic in and around these joints, we can temporarily block the nerve supply to them.  Steroids act directly on irritated nerves and tissues to reduce selling and inflammation which often leads to decreased pain.  Facet blocks may be done anywhere along the spine from the neck to the low back depending upon the location of your pain.   After numbing the skin with local anesthetic (like Novocaine), a small needle is passed onto the facet joints under x-ray guidance.  You may experience a sensation of pressure while this is being done.  The entire block usually lasts about 15-25 minutes.   Conditions which may be treated by facet blocks:   Low back/buttock pain  Neck/shoulder pain  Certain types of headaches  Preparation for the injection:  12. Do not eat any solid food or dairy products within 8 hours of your appointment. 13. You may drink clear liquid up to 3 hours before appointment.  Clear liquids include water, black coffee, juice or soda.  No milk or cream please. 14. You may take your regular medication, including pain medications, with a sip of water before your appointment.  Diabetics should hold regular insulin (if taken separately) and take 1/2 normal NPH dose the morning of the procedure.  Carry some sugar containing items with you to your appointment. 15. A driver must accompany you and be prepared to drive you home after your procedure. 16. Bring all your current medications with you. 17. An IV may be inserted and sedation may be given at the discretion of the physician. 18. A blood pressure cuff, EKG and other monitors will often be applied during the procedure.  Some patients may need to have extra oxygen administered for a short  period. 19. You will be asked to provide medical information, including your allergies and medications, prior to the procedure.  We must know immediately if you are taking blood thinners (like Coumadin/Warfarin) or if you are allergic to IV iodine contrast (dye).  We must know if you could possible be pregnant.  Possible side-effects:   Bleeding from needle site  Infection (rare, may require surgery)  Nerve injury (rare)  Numbness & tingling (temporary)  Difficulty urinating (rare, temporary)  Spinal headache (a headache worse with upright posture)  Light-headedness (temporary)  Pain at injection site (serveral days)  Decreased blood pressure (rare, temporary)  Weakness in arm/leg (temporary)  Pressure sensation in back/neck (temporary)   Call if you experience:   Fever/chills associated with headache or increased back/neck pain  Headache worsened by an upright position  New onset, weakness or numbness of an extremity below the injection site  Hives or difficulty breathing (go to the emergency room)  Inflammation or drainage at the injection site(s)  Severe back/neck pain greater than usual  New symptoms which are concerning to you  Please note:  Although the local anesthetic injected can often make your back or neck feel good for   good for several hours after the injection, the pain will likely return. It takes 3-7 days for steroids to work.  You may not notice any pain relief for at least one week.  If effective, we will often do a series of 2-3 injections spaced 3-6 weeks apart to maximally decrease your pain.  After the initial series, you may be a candidate for a more permanent nerve block of the facets.  If you have any questions, please call #[phone]) [facility]

## 2015-11-28 NOTE — Progress Notes (Signed)
Quick Note:  Results were reviewed and found to be: abnormal   Based on these results: the patient may be a candidate for interventional pain management options  In addition: surgical consultation may be of benefit ______

## 2015-12-01 LAB — TOXASSURE SELECT 13 (MW), URINE: PDF: 0

## 2015-12-08 ENCOUNTER — Ambulatory Visit: Payer: Medicare Other | Attending: Pain Medicine | Admitting: Pain Medicine

## 2015-12-08 ENCOUNTER — Encounter: Payer: Self-pay | Admitting: Pain Medicine

## 2015-12-08 VITALS — BP 156/95 | HR 66 | Temp 97.8°F | Resp 16 | Ht 67.0 in | Wt 190.0 lb

## 2015-12-08 DIAGNOSIS — M79673 Pain in unspecified foot: Secondary | ICD-10-CM

## 2015-12-08 DIAGNOSIS — M79643 Pain in unspecified hand: Secondary | ICD-10-CM | POA: Insufficient documentation

## 2015-12-08 DIAGNOSIS — R1011 Right upper quadrant pain: Secondary | ICD-10-CM | POA: Diagnosis not present

## 2015-12-08 DIAGNOSIS — M25512 Pain in left shoulder: Secondary | ICD-10-CM

## 2015-12-08 DIAGNOSIS — M19011 Primary osteoarthritis, right shoulder: Secondary | ICD-10-CM

## 2015-12-08 DIAGNOSIS — M549 Dorsalgia, unspecified: Secondary | ICD-10-CM | POA: Diagnosis present

## 2015-12-08 DIAGNOSIS — M47812 Spondylosis without myelopathy or radiculopathy, cervical region: Secondary | ICD-10-CM

## 2015-12-08 DIAGNOSIS — M47896 Other spondylosis, lumbar region: Secondary | ICD-10-CM

## 2015-12-08 DIAGNOSIS — E1142 Type 2 diabetes mellitus with diabetic polyneuropathy: Secondary | ICD-10-CM | POA: Diagnosis not present

## 2015-12-08 DIAGNOSIS — M4802 Spinal stenosis, cervical region: Secondary | ICD-10-CM

## 2015-12-08 DIAGNOSIS — M069 Rheumatoid arthritis, unspecified: Secondary | ICD-10-CM | POA: Diagnosis not present

## 2015-12-08 DIAGNOSIS — M5116 Intervertebral disc disorders with radiculopathy, lumbar region: Secondary | ICD-10-CM | POA: Insufficient documentation

## 2015-12-08 DIAGNOSIS — M5382 Other specified dorsopathies, cervical region: Secondary | ICD-10-CM

## 2015-12-08 DIAGNOSIS — M545 Low back pain: Secondary | ICD-10-CM

## 2015-12-08 DIAGNOSIS — M79601 Pain in right arm: Secondary | ICD-10-CM

## 2015-12-08 DIAGNOSIS — G5603 Carpal tunnel syndrome, bilateral upper limbs: Secondary | ICD-10-CM | POA: Diagnosis not present

## 2015-12-08 DIAGNOSIS — F1190 Opioid use, unspecified, uncomplicated: Secondary | ICD-10-CM

## 2015-12-08 DIAGNOSIS — M4310 Spondylolisthesis, site unspecified: Secondary | ICD-10-CM

## 2015-12-08 DIAGNOSIS — I10 Essential (primary) hypertension: Secondary | ICD-10-CM | POA: Diagnosis not present

## 2015-12-08 DIAGNOSIS — M4316 Spondylolisthesis, lumbar region: Secondary | ICD-10-CM | POA: Insufficient documentation

## 2015-12-08 DIAGNOSIS — M47816 Spondylosis without myelopathy or radiculopathy, lumbar region: Secondary | ICD-10-CM | POA: Diagnosis not present

## 2015-12-08 DIAGNOSIS — M75122 Complete rotator cuff tear or rupture of left shoulder, not specified as traumatic: Secondary | ICD-10-CM | POA: Diagnosis not present

## 2015-12-08 DIAGNOSIS — R1010 Upper abdominal pain, unspecified: Secondary | ICD-10-CM

## 2015-12-08 DIAGNOSIS — M25511 Pain in right shoulder: Secondary | ICD-10-CM | POA: Diagnosis not present

## 2015-12-08 DIAGNOSIS — M19012 Primary osteoarthritis, left shoulder: Secondary | ICD-10-CM | POA: Diagnosis not present

## 2015-12-08 DIAGNOSIS — M79606 Pain in leg, unspecified: Secondary | ICD-10-CM | POA: Diagnosis present

## 2015-12-08 DIAGNOSIS — M25552 Pain in left hip: Secondary | ICD-10-CM | POA: Insufficient documentation

## 2015-12-08 DIAGNOSIS — R519 Headache, unspecified: Secondary | ICD-10-CM

## 2015-12-08 DIAGNOSIS — G8929 Other chronic pain: Secondary | ICD-10-CM

## 2015-12-08 DIAGNOSIS — M5416 Radiculopathy, lumbar region: Secondary | ICD-10-CM

## 2015-12-08 DIAGNOSIS — M7138 Other bursal cyst, other site: Secondary | ICD-10-CM

## 2015-12-08 DIAGNOSIS — Z79891 Long term (current) use of opiate analgesic: Secondary | ICD-10-CM

## 2015-12-08 DIAGNOSIS — M542 Cervicalgia: Secondary | ICD-10-CM | POA: Insufficient documentation

## 2015-12-08 DIAGNOSIS — M4806 Spinal stenosis, lumbar region: Secondary | ICD-10-CM | POA: Diagnosis not present

## 2015-12-08 DIAGNOSIS — M792 Neuralgia and neuritis, unspecified: Secondary | ICD-10-CM | POA: Insufficient documentation

## 2015-12-08 DIAGNOSIS — M12819 Other specific arthropathies, not elsewhere classified, unspecified shoulder: Secondary | ICD-10-CM

## 2015-12-08 DIAGNOSIS — E7800 Pure hypercholesterolemia, unspecified: Secondary | ICD-10-CM | POA: Diagnosis not present

## 2015-12-08 DIAGNOSIS — M539 Dorsopathy, unspecified: Secondary | ICD-10-CM

## 2015-12-08 DIAGNOSIS — K219 Gastro-esophageal reflux disease without esophagitis: Secondary | ICD-10-CM | POA: Insufficient documentation

## 2015-12-08 DIAGNOSIS — M1288 Other specific arthropathies, not elsewhere classified, other specified site: Secondary | ICD-10-CM | POA: Diagnosis not present

## 2015-12-08 DIAGNOSIS — M25519 Pain in unspecified shoulder: Secondary | ICD-10-CM | POA: Diagnosis present

## 2015-12-08 DIAGNOSIS — Q762 Congenital spondylolisthesis: Secondary | ICD-10-CM

## 2015-12-08 DIAGNOSIS — M5412 Radiculopathy, cervical region: Secondary | ICD-10-CM

## 2015-12-08 DIAGNOSIS — M533 Sacrococcygeal disorders, not elsewhere classified: Secondary | ICD-10-CM | POA: Diagnosis not present

## 2015-12-08 DIAGNOSIS — Z5181 Encounter for therapeutic drug level monitoring: Secondary | ICD-10-CM

## 2015-12-08 DIAGNOSIS — M48061 Spinal stenosis, lumbar region without neurogenic claudication: Secondary | ICD-10-CM

## 2015-12-08 DIAGNOSIS — M79602 Pain in left arm: Secondary | ICD-10-CM

## 2015-12-08 DIAGNOSIS — R51 Headache: Secondary | ICD-10-CM

## 2015-12-08 DIAGNOSIS — M961 Postlaminectomy syndrome, not elsewhere classified: Secondary | ICD-10-CM

## 2015-12-08 MED ORDER — OXYCODONE HCL 10 MG PO TABS: 10.0000 mg | ORAL_TABLET | Freq: Four times a day (QID) | ORAL | Status: DC | PRN

## 2015-12-08 MED ORDER — GABAPENTIN 100 MG PO CAPS: 100.0000 mg | ORAL_CAPSULE | Freq: Three times a day (TID) | ORAL | Status: DC

## 2015-12-08 NOTE — Progress Notes (Signed)
Patient's Name: Howard Murphy  Patient type: Established  MRN: [id number]  Service setting: Ambulatory outpatient  DOB: 12-28-1955  Location: [facility]  DOS: 12/08/2015  Primary Care Physician: Howard Rake, MD  Note by: Kathlen Brunswick. Dossie Arbour, M.D, DABA, Sarita Haver, DABPM, Milagros Evener, FIPP  Referring Physician: Roselee Nova, MD  Specialty: Board-Certified Interventional Pain Management  Last Visit to Pain Management: 11/21/2015   Primary Reason(s) for Visit: Encounter for prescription drug management (Level of risk: moderate) CC: Shoulder Pain; Back Pain; Hand Pain; and Leg Pain   HPI  Howard Murphy is a 60 y.o. year old, male patient, who returns today as an established patient. He has Benign fibroma of prostate; Elevated hemoglobin ([place]); Acid reflux; BP (high blood pressure); Hypercholesteremia; HLD (hyperlipidemia); Arthritis or polyarthritis, rheumatoid ([place]); History of lumbar facet Synovial cyst, surgically removed.; Rheumatoid arthritis ([place]); Breath shortness; Dyspnea; Chronic abdominal pain (RUQ); Type 2 diabetes mellitus with peripheral neuropathy ([place]); Chest pain; Hypertriglyceridemia; Chronic pain; Opiate use (60 MME/Day); Long term prescription opiate use; Long term current use of opiate analgesic; Chronic low back pain (Bilateral) (L>R); Lumbar spondylosis; Lumbar facet syndrome (Bilateral) (L>R); Chronic hip pain (Left); Diabetic peripheral neuropathy ([place]); Chronic lumbar radicular pain (S1 Dermatome) (Bilateral) (L>R); Hypokalemia; Complete tear of the shoulder rotator cuff (Left); Chronic shoulder pain (Location of Primary Source of Pain) (Bilateral) (L>R); Non-insulin dependent type 2 diabetes mellitus ([place]); Chronic foot pain (Location of Secondary source of pain) (Bilateral) (L>R); Chronic hand pain (Location of Tertiary source of pain) (Bilateral) (R>L); Lumbar facet hypertrophy; Encounter for therapeutic drug level monitoring; Encounter for pain management  planning; Low testosterone; Occipital pain (Right); Failed back surgical syndrome 2; Diuretic-induced hypokalemia; Chronic low back pain with left-sided sciatica; Radicular pain of shoulder (Right); Carpal tunnel syndrome (Bilateral); Chronic cervical radicular pain (Right); Abnormal MRI, lumbar spine; Chronic sacroiliac joint pain (Bilateral) (L>R); Neurogenic pain; Cervical spondylosis; Cervical facet arthropathy; Chronic neck pain (Bilateral) (R>L); Cervical facet syndrome (Bilateral) (R>L); Chronic upper extremity pain (Bilateral) (R>L); Cervical foraminal stenosis;  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 His primarily concern today is the Shoulder Pain; Back Pain; Hand Pain; and Leg Pain   Pain Assessment: Self-Reported Pain Score: 5  Reported level is compatible with observation       Pain Type: Chronic pain Pain Location: Back Pain Orientation: Lower Pain Descriptors / Indicators: Aching, Burning Pain Frequency: Constant  The patient comes into the clinics today for pharmacological management of his chronic pain. I last saw this patient on 11/21/2015. The patient  reports that he does not use illicit drugs. His body mass index is 29.75 kg/(m^2).  Date of Last Visit: 11/21/15 Service Provided on Last Visit: Med Refill  Controlled Substance Pharmacotherapy Assessment & REMS (Risk Evaluation and Mitigation Strategy)  Analgesic: Oxycodone IR 10 mg every 6 hours (40 mg/day of oxycodone) MME/day: 60 mg/day Pill Count: Oxycodone 10 mg #97 out of 120 remaining. Filled 12-02-15. Pharmacokinetics: Onset of action (Liberation/Absorption): Within expected pharmacological parameters Time to Peak effect  (Distribution): Timing and results are as within normal expected parameters Duration of action (Metabolism/Excretion): Within normal limits for medication Pharmacodynamics: Analgesic Effect: More than 50% Activity Facilitation: Medication(s) allow patient to sit, stand, walk, and do the basic ADLs Perceived  Effectiveness: Described as relatively effective, allowing for increase in activities of daily living (ADL) Side-effects or Adverse reactions: None reported Monitoring: [place] PMP: Online review of the past 14-month period conducted. Compliant with practice rules and regulations Last UDS on record: TOXASSURE SELECT 13  Date Value Ref Range Status  11/21/2015 FINAL  Final    Comment:    ==================================================================== TOXASSURE SELECT 13 (MW) ==================================================================== Test                             Result       Flag       Units Drug Present and Declared for Prescription Verification   Oxycodone                      912          EXPECTED   ng/mg creat   Oxymorphone                    863          EXPECTED   ng/mg creat   Noroxycodone                   1428         EXPECTED   ng/mg creat   Noroxymorphone                 302          EXPECTED   ng/mg creat    Sources of oxycodone are scheduled prescription medications.    Oxymorphone, noroxycodone, and noroxymorphone are expected    metabolites of oxycodone. Oxymorphone is also available as a    scheduled prescription medication. ==================================================================== Test                      Result    Flag   Units      Ref Range   Creatinine              218              mg/dL      >=20 ==================================================================== Declared Medications:  The flagging and interpretation on this report are based on the  following declared medications.  Unexpected results may arise from  inaccuracies in  the declared medications.  **Note: The testing scope of this panel includes these medications:  Oxycodone  **Note: The testing scope of this panel does not include following  reported medications:  Amlodipine (Norvasc)  Chlorthalidone  Cyanocobalamin  Icosapent  Lisinopril  Naloxone  Omeprazole (Prilosec)  Ondansetron (Zofran)  Rosuvastatin (Crestor)  Sitagliptin (Januvia)  Sucralfate (Carafate)  Testosterone ==================================================================== For clinical consultation, please call [phone]. ====================================================================    UDS interpretation: Compliant          Medication Assessment Form: Reviewed. Patient indicates being compliant with therapy Treatment compliance: Compliant Risk Assessment: Aberrant Behavior: None observed today Substance Use Disorder (SUD) Risk Level: No change since last visit Risk of opioid abuse or dependence: 0.7-3.0% with doses ? 36 MME/day and 6.1-26% with doses ? 120 MME/day. Opioid Risk Tool (ORT) Score:  0 Low Risk for SUD (Score <3) Depression Scale Score: PHQ-2: PHQ-2 Total Score: 0 No depression (0) PHQ-9: PHQ-9 Total Score: 0 No depression (0-4)  Pharmacologic Plan: No change in therapy, at this time  Laboratory Chemistry  Inflammation Markers Lab Results  Component Value Date   ESRSEDRATE 1 09/05/2015  CRP 0.6 09/05/2015    Renal Function Lab Results  Component Value Date   BUN 12 09/06/2015   CREATININE 1.13 09/06/2015   GFRAA 81 09/06/2015   GFRNONAA 70 09/06/2015    Hepatic Function Lab Results  Component Value Date   AST 38 09/05/2015   ALT 46 09/05/2015   ALBUMIN 4.6 09/05/2015    Electrolytes Lab Results  Component Value Date   NA 141 09/06/2015   K 3.9 09/06/2015   CL 94* 09/06/2015   CALCIUM 10.2 09/06/2015   MG 2.1 09/05/2015    Pain Modulating Vitamins No results found for: [place], [id number], [id number], [id number],  25OHVITD1, 25OHVITD2, 25OHVITD3, VITAMINB12  Coagulation Parameters Lab Results  Component Value Date   PLT 300 07/15/2015    Note: Labs Reviewed.  Recent Diagnostic Imaging  Ct Cervical Spine Wo Contrast  11/03/2015  CLINICAL DATA:  Chronic radicular neck pain with BILATERAL upper extremity weakness and numbness. EXAM: CT CERVICAL SPINE WITHOUT CONTRAST TECHNIQUE: Multidetector CT imaging of the cervical spine was performed without intravenous contrast. Multiplanar CT image reconstructions were also generated. COMPARISON:  12/17/2012. FINDINGS: Alignment is anatomic, although there is some straightening of the normal cervical lordosis. No worrisome osseous lesions. Nuchal ligament calcification. No neck masses. Lung apices not well visualized. No visible atherosclerotic calcification. The individual disc spaces were examined as follows: C2-3:  Unremarkable disc space.  Mild facet arthropathy. C3-4: Unremarkable disc space. Mild facet arthropathy and uncinate spurring on the RIGHT could affect the exiting nerve root. C4-5: Severe disc space narrowing with central disc osteophyte complex and BILATERAL uncinate spurring, RIGHT greater than LEFT. Mild stenosis. RIGHT greater than LEFT C5 nerve root impingement is likely. C5-6: Mild disc space narrowing. BILATERAL uncinate spurring without facet disease. Annular bulging in the midline. Mild stenosis. Either C6 nerve root could be affected. C6-7: Moderate disc space narrowing. BILATERAL uncinate spurring and central disc calcification. Foraminal narrowing could affect either C7 nerve root. C7-T1:  Unremarkable. IMPRESSION: Multilevel spondylosis as described. When compared with 2014, similar appearance. Electronically Signed   By: Staci Righter M.D.   On: 11/03/2015 09:37   Mr Lumbar Spine Wo Contrast  11/03/2015  CLINICAL DATA:  Chronic radicular back pain. Left leg pain and numbness. EXAM: MRI LUMBAR SPINE WITHOUT CONTRAST TECHNIQUE: Multiplanar,  multisequence MR imaging of the lumbar spine was performed. No intravenous contrast was administered. COMPARISON:  Lumbar radiographs 09/05/2015 FINDINGS: Segmentation:  Normal lumbar segmentation.  Lowest disc space L5-S1. Alignment:  Slight anterior slip L4-5.  Remaining alignment normal. Vertebrae:  Negative for fracture or mass lesion. Conus medullaris: Extends to the T12-L1 level and appears normal. Paraspinal and other soft tissues: Paraspinous muscles normal. No retroperitoneal mass or adenopathy. No aortic aneurysm. Disc levels: L1-2: Small left lateral disc and osteophyte complex with mild displacement of the left L1 nerve root. No significant spinal stenosis L2-3: Small left lateral disc and osteophyte complex with mild displacement of the left L2 nerve root. Mild facet degeneration. Mild spinal stenosis. L3-4: Diffuse bulging of the disc with bilateral facet hypertrophy. Moderate spinal stenosis. L4-5: 2 mm anterior slip. Diffuse disc bulging. Moderate facet hypertrophy bilaterally with moderate spinal stenosis. Subarticular and foraminal stenosis on the left. 6 mm synovial cyst in the posterior spinal canal on the left just above the L5-S1 disc space. This may be arising from the L4-5 facet joint. This appears to be causing impingement of the left S1 nerve root. L5-S1:  Normal disc space.  No significant facet degeneration. IMPRESSION: Small  left lateral disc and osteophyte complex at L1-2 and L2-3 Moderate spinal stenosis L3-4 Moderate spinal stenosis L4-5. Subarticular and foraminal stenosis on the left. 6 mm synovial cyst on the left between the L4-5 and L5-1 disc spaces. This is causing impingement of the left S1 nerve root. This is most likely arising from the L4-5 facet joint on left given the amount of degenerative change and fluid in the facet joint. The left L5-S1 facet joint is not degenerated. Electronically Signed   By: Franchot Gallo M.D.   On: 11/03/2015 08:55   Cervical  Imaging: Cervical CT wo contrast:  Results for orders placed during the hospital encounter of 11/03/15  CT Cervical Spine Wo Contrast   Narrative CLINICAL DATA:  Chronic radicular neck pain with BILATERAL upper extremity weakness and numbness.  EXAM: CT CERVICAL SPINE WITHOUT CONTRAST  TECHNIQUE: Multidetector CT imaging of the cervical spine was performed without intravenous contrast. Multiplanar CT image reconstructions were also generated.  COMPARISON:  12/17/2012.  FINDINGS: Alignment is anatomic, although there is some straightening of the normal cervical lordosis. No worrisome osseous lesions. Nuchal ligament calcification. No neck masses. Lung apices not well visualized. No visible atherosclerotic calcification.  The individual disc spaces were examined as follows:  C2-3:  Unremarkable disc space.  Mild facet arthropathy.  C3-4: Unremarkable disc space. Mild facet arthropathy and uncinate spurring on the RIGHT could affect the exiting nerve root.  C4-5: Severe disc space narrowing with central disc osteophyte complex and BILATERAL uncinate spurring, RIGHT greater than LEFT. Mild stenosis. RIGHT greater than LEFT C5 nerve root impingement is likely.  C5-6: Mild disc space narrowing. BILATERAL uncinate spurring without facet disease. Annular bulging in the midline. Mild stenosis. Either C6 nerve root could be affected.  C6-7: Moderate disc space narrowing. BILATERAL uncinate spurring and central disc calcification. Foraminal narrowing could affect either C7 nerve root.  C7-T1:  Unremarkable.  IMPRESSION: Multilevel spondylosis as described. When compared with 2014, similar appearance.   Electronically Signed   By: Staci Righter M.D.   On: 11/03/2015 09:37    Shoulder Imaging: Shoulder-R MR wo contrast:  Results for orders placed in visit on 08/10/14  MR Shoulder Right Wo Contrast   Narrative * PRIOR REPORT IMPORTED FROM AN EXTERNAL SYSTEM *   CLINICAL  DATA:  Right upper arm and shoulder pain when lifting and  elevating the extremity for 2 months.   EXAM:  MRI OF THE RIGHT SHOULDER WITHOUT CONTRAST   TECHNIQUE:  Multiplanar, multisequence MR imaging of the shoulder was performed.  No intravenous contrast was administered.   COMPARISON:  None.   FINDINGS:  Rotator cuff: Insertional tear of the distal supraspinatus tendon  with equivocal extension to the bursal surface on images 9 and 13 of  series 4. I do not see definite extension to the articular surface.  Accordingly this is considered a partial tear. Adjacent Mild  supraspinatus tendinopathy. Partial-thickness intrasubstance tearing  in the distal infraspinatus tendon. Subscapularis and teres minor  intact.   Muscles:  Unremarkable   Biceps long head:  Mild tendinopathy   Acromioclavicular Joint: Moderate degenerative spurring with edema  within and along the [facility] joint. Trace fluid in the subacromial  subdeltoid bursa. Inferior spurring from the [facility] joint impinges on  the supraspinatus muscle.   Glenohumeral Joint: Mild degenerative chondral thinning.   Labrum: Indistinct increased signal in the superior labrum, images  10 through 12 of series 4, suspicious for nondisplaced SLAP tear.   Bones: Degenerative subcortical cyst  formation posteriorly and in  the central portion of the greater tuberosity.   IMPRESSION:  1. Partial thickness insertional tear of the distal supraspinatus  tendon, equivocal extension to the bursal surface without definite  articular surface extension. There is some adjacent intrasubstance  tearing in the infraspinatus tendon along with associated  tendinopathy in the supraspinatus and infraspinatus.  2. Trace subacromial subdeltoid bursitis.  3. Moderate degeneration of the [facility] joint, with AC joint spurring  indenting the top of the supraspinatus muscle.  4. Mild degenerative chondral thinning in the glenohumeral joint.  5. Faintly increased  linear signal along the base of the superior  labrum, suspicious for a nondisplaced labral tear. This could be  confirmed with MR arthrography if clinically warranted.    Electronically Signed    By: Van Clines M.D.    On: 08/10/2014 13:11       Shoulder-L DG:  Results for orders placed during the hospital encounter of 05/06/15  DG Shoulder Left   Narrative CLINICAL DATA:  Left shoulder pain, fell out of chair tonight  EXAM: LEFT SHOULDER - 2+ VIEW  COMPARISON:  None.  FINDINGS: Four views of the left shoulder submitted. No acute fracture or subluxation. Mild degenerative changes AC joint. Glenohumeral joint is preserved.  IMPRESSION: No acute fracture or subluxation. Mild degenerative changes acromioclavicular joint.   Electronically Signed   By: Lahoma Crocker M.D.   On: 05/06/2015 23:46    Lumbosacral Imaging: Lumbar MR wo contrast:  Results for orders placed during the hospital encounter of 11/03/15  MR Lumbar Spine Wo Contrast   Narrative CLINICAL DATA:  Chronic radicular back pain. Left leg pain and numbness.  EXAM: MRI LUMBAR SPINE WITHOUT CONTRAST  TECHNIQUE: Multiplanar, multisequence MR imaging of the lumbar spine was performed. No intravenous contrast was administered.  COMPARISON:  Lumbar radiographs 09/05/2015  FINDINGS: Segmentation:  Normal lumbar segmentation.  Lowest disc space L5-S1.  Alignment:  Slight anterior slip L4-5.  Remaining alignment normal.  Vertebrae:  Negative for fracture or mass lesion.  Conus medullaris: Extends to the T12-L1 level and appears normal.  Paraspinal and other soft tissues: Paraspinous muscles normal. No retroperitoneal mass or adenopathy. No aortic aneurysm.  Disc levels:  L1-2: Small left lateral disc and osteophyte complex with mild displacement of the left L1 nerve root. No significant spinal stenosis  L2-3: Small left lateral disc and osteophyte complex with mild displacement of the left L2  nerve root. Mild facet degeneration. Mild spinal stenosis.  L3-4: Diffuse bulging of the disc with bilateral facet hypertrophy. Moderate spinal stenosis.  L4-5: 2 mm anterior slip. Diffuse disc bulging. Moderate facet hypertrophy bilaterally with moderate spinal stenosis. Subarticular and foraminal stenosis on the left.  6 mm synovial cyst in the posterior spinal canal on the left just above the L5-S1 disc space. This may be arising from the L4-5 facet joint. This appears to be causing impingement of the left S1 nerve root.  L5-S1:  Normal disc space.  No significant facet degeneration.  IMPRESSION: Small left lateral disc and osteophyte complex at L1-2 and L2-3  Moderate spinal stenosis L3-4  Moderate spinal stenosis L4-5. Subarticular and foraminal stenosis on the left.  6 mm synovial cyst on the left between the L4-5 and L5-1 disc spaces. This is causing impingement of the left S1 nerve root. This is most likely arising from the L4-5 facet joint on left given the amount of degenerative change and fluid in the facet joint. The left L5-S1 facet joint  is not degenerated.   Electronically Signed   By: Franchot Gallo M.D.   On: 11/03/2015 08:55    Lumbar MR wo contrast:  Results for orders placed in visit on 01/27/09  MR L Spine Ltd W/O Cm   Narrative * PRIOR REPORT IMPORTED FROM AN EXTERNAL SYSTEM *   PRIOR REPORT IMPORTED FROM THE [facility] WORKFLOW SYSTEM   REASON FOR EXAM:    LOW BACK PAIN W RADICULITIS  COMMENTS:   PROCEDURE:     MR  - MR LUMBAR SPINE WO CONTRAST  - Jan 27 2009  5:00PM   RESULT:     Lumbar MRI dated 01/27/2009   Technique: Multiplanar multisequence imaging lumbar spine was obtained  without administration of gadolinium.   Findings: The conus medullaris  terminates at T12-L1 level. The cauda  equina  demonstrate no evidence of clumping or thickening.   At the T12-L1 level, L1-L2 level, demonstrate no evidence of thecal sac  stenosis nor  neuroforaminal narrowing.   At the L2-L3 disc space level a mild broad based disc bulge is appreciated  demonstrating lateralization to the left. There is no evidence of thecal  sac  stenosis though there is mild neuroforaminal narrowing on the left with  possible exiting nerve root compromise.   At the L3-L4 level broad-based disc bulge is appreciated causing partial  effacement of the anterior CSF space. There is mild thecal sac narrowing  without evidence of neuroforaminal foraminal narrowing.   The L4-L5 level a lobulated disc bulge is appreciated demonstrating  lateralization to the left. This contributes to Central neuroforaminal  narrowing is also secondary to an element of facet hypertrophy. Mass  effect  upon the central exiting nerve root on the left is appreciated with  findings  concerning for exiting nerve root compromise and mild compression  centrally.  There is mild thecal sac stenosis at this level which primarily from a  hypertrophic spurring of the left lamina and  facet.   At the L5-S1 level a mild broad based disc bulge is appreciated  demonstrating lateralization to the left. This causes encroachment upon  the  central exiting nerve root also exiting nerve root compromise.   Evaluation of the osseous structures demonstrates no T1 or T2 signal  abnormalities.   IMPRESSION:      Areas of mild disc bulges as described above with  associated regions of neuroforaminal narrowing. There are areas concerning  for exiting nerve root compromise centrally at L5-S1 and centrally at the  L4-L5 level. These findings are on the left. Uncal correlation recommended  and if warranted surgical consultation.       Lumbar DG Bending views:  Results for orders placed during the hospital encounter of 09/05/15  DG Lumbar Spine Complete W/Bend   Narrative CLINICAL DATA:  60 year old male with chronic lumbar back pain. Symptoms increasing for 2 years. No recent injury. Pain  radiating to the left hip. Initial encounter.  EXAM: LUMBAR SPINE - COMPLETE WITH BENDING VIEWS  COMPARISON:  Lumbar MRI 01/27/2009. CT Abdomen and Pelvis 04/13/2015  FINDINGS: Partially lumbarized S1 level with bilateral sclerotic S1-S2 assimilation joints. Associated anterior superior SI joint osteophytosis, better demonstrated in 2016. Trace grade 1 anterolisthesis at L5-S1. Moderate to severe chronic facet degeneration at that level. Moderate facet degeneration at L4-L5 and L3-L4. No pars fracture. Relatively preserved lumbar disc spaces. Visible lower thoracic levels appear intact.  Lateral views in flexion and extension. No abnormal motion identified in the lumbar spine.  IMPRESSION: 1.  Moderate to severe lower lumbar facet degeneration, maximal at L5-S1 where there is stable trace anterolisthesis. 2. Moderate S1-S2 and anterior superior SI joint degeneration, in part due to partially lumbarized S1 level.   Electronically Signed   By: Genevie Ann M.D.   On: 09/05/2015 15:42    Hip Imaging: Hip-L DG 2-3 views:  Results for orders placed during the hospital encounter of 09/05/15  DG HIP UNILAT W OR W/O PELVIS 2-3 VIEWS LEFT   Narrative CLINICAL DATA:  Chronic back pain, injury were 20 years ago, increased pain in last 2 years, LEFT hip and low back pain, SI joint pain, history hypertension, diabetes mellitus  EXAM: DG HIP (WITH OR WITHOUT PELVIS) 2-3V LEFT  COMPARISON:  None  FINDINGS: Osseous mineralization normal.  Hip and SI joint spaces symmetric and preserved.  No acute fracture, dislocation or bone destruction.  Few scattered pelvic phleboliths.  Disc space narrowing L4-L5, questionably L5-S1.  IMPRESSION: No significant LEFT hip abnormalities.  Degenerative disc disease changes L4-L5.   Electronically Signed   By: Lavonia Dana M.D.   On: 09/05/2015 15:43    Note: Imaging results reviewed.  Meds  The patient has a current medication list  which includes the following prescription(s): amlodipine, chlorthalidone, vitamin d3, naloxone hcl, omeprazole, ondansetron, oxycodone hcl, oxycodone hcl, oxycodone hcl, rosuvastatin, sitagliptin, sucralfate, testosterone cypionate, and gabapentin.  Current Outpatient Prescriptions on File Prior to Visit  Medication Sig  . amLODipine (NORVASC) 5 MG tablet Take 1 tablet (5 mg total) by mouth daily.  . chlorthalidone (HYGROTON) 50 MG tablet   . Naloxone HCl (NARCAN) 4 MG/0.1ML LIQD Spray into one nostril. Repeat with second device into other nostril after 2-3 minutes if no or minimal response.  Marland Kitchen omeprazole (PRILOSEC) 40 MG capsule Take 40 mg by mouth 2 (two) times daily.  . ondansetron (ZOFRAN-ODT) 4 MG disintegrating tablet Take 4 mg by mouth every 8 (eight) hours as needed for nausea or vomiting.  . rosuvastatin (CRESTOR) 20 MG tablet   . sitaGLIPtin (JANUVIA) 50 MG tablet Take 1 tablet (50 mg total) by mouth daily.  . sucralfate (CARAFATE) 1 G tablet Take 1 tablet by mouth 4 (four) times daily.  Marland Kitchen testosterone cypionate (DEPOTESTOSTERONE CYPIONATE) 200 MG/ML injection Inject 200 mg into the muscle every 14 (fourteen) days.   No current facility-administered medications on file prior to visit.    ROS  Constitutional: Denies any fever or chills Gastrointestinal: No reported hemesis, hematochezia, vomiting, or acute GI distress Musculoskeletal: Denies any acute onset joint swelling, redness, loss of ROM, or weakness Neurological: No reported episodes of acute onset apraxia, aphasia, dysarthria, agnosia, amnesia, paralysis, loss of coordination, or loss of consciousness  Allergies  Mr. Golon has No Known Allergies.  [place]  Medical:  Mr. Mccoin  has a past medical history of Diabetes mellitus without complication ([place]); Hypertension; GERD (gastroesophageal reflux disease); Hyperlipemia; Nephrolithiasis; Shortness of breath dyspnea; Arthritis; and Back ache (05/05/2013). Family: family  history includes Cancer in his maternal grandmother, mother, sister, and sister; Heart disease in his father; Stroke in his father. There is no history of Bladder Cancer, Prostate cancer, or Kidney cancer. Surgical:  has past surgical history that includes Cholecystectomy (2000); Back surgery (2015); Shoulder surgery (Right, 2016); Colonoscopy with propofol (N/A, 04/11/2015); and Esophagogastroduodenoscopy (egd) with propofol (N/A, 04/11/2015). Tobacco:  reports that he has never smoked. He has never used smokeless tobacco. Alcohol:  reports that he does not drink alcohol. Drug:  reports that he does not use illicit  drugs.  Constitutional Exam  Vitals: Blood pressure 156/95, pulse 66, temperature 97.8 F (36.6 C), temperature source Oral, resp. rate 16, height 5\' 7"  (1.702 m), weight 190 lb (86.183 kg), SpO2 97 %. General appearance: Well nourished, well developed, and well hydrated. In no acute distress Calculated BMI/Body habitus: Body mass index is 29.75 kg/(m^2). (25-29.9 kg/m2) Overweight - 20% higher incidence of chronic pain Psych/Mental status: Alert and oriented x 3 (person, place, & time) Eyes: PERLA Respiratory: No evidence of acute respiratory distress  Cervical Spine Exam  Inspection: No masses, redness, or swelling Alignment: Symmetrical ROM: Functional: ROM is within functional limits [facility]) Stability: No instability detected Muscle strength & Tone: Functionally intact Sensory: Unimpaired Palpation: No complaints of tenderness  Upper Extremity (UE) Exam    Side: Right upper extremity  Side: Left upper extremity  Inspection: No masses, redness, swelling, or asymmetry  Inspection: No masses, redness, swelling, or asymmetry  ROM:  ROM:  Functional: ROM is within functional limits [facility])  Functional: ROM is within functional limits [facility])  Muscle strength & Tone: Functionally intact  Muscle strength & Tone: Functionally intact  Sensory: Unimpaired  Sensory: Unimpaired   Palpation: No complaints of tenderness  Palpation: No complaints of tenderness   Thoracic Spine Exam  Inspection: No masses, redness, or swelling Alignment: Symmetrical ROM: Functional: ROM is within functional limits [facility]) Stability: No instability detected Sensory: Unimpaired Muscle strength & Tone: Functionally intact Palpation: No complaints of tenderness  Lumbar Spine Exam  Inspection: No masses, redness, or swelling Alignment: Symmetrical ROM: Functional: Decreased ROM Stability: No instability detected Muscle strength & Tone: Functionally intact Sensory: Movement-associated discomfort Palpation: Area tender to palpation Provocative Tests: Lumbar Hyperextension and rotation test: Positive for bilateral lumbar facet pain Patrick's Maneuver: Positive for bilateral sacroiliac joint pain and left hip pain.  Gait & Posture Assessment  Ambulation: Unassisted Gait: Antalgic Posture: WNL  Lower Extremity Exam    Side: Right lower extremity  Side: Left lower extremity  Inspection: No masses, redness, swelling, or asymmetry ROM:  Inspection: No masses, redness, swelling, or asymmetry ROM:  Functional: ROM is within functional limits [facility])  Functional: ROM is within functional limits [facility])  Muscle strength & Tone: Functionally intact  Muscle strength & Tone: Functionally intact  Sensory: Unimpaired  Sensory: Unimpaired  Palpation: No complaints of tenderness  Palpation: No complaints of tenderness   Assessment & Plan  Primary Diagnosis & Pertinent Problem List: The primary encounter diagnosis was Chronic pain. Diagnoses of Encounter for therapeutic drug level monitoring, Long term current use of opiate analgesic, Opiate use (60 MME/Day), Chronic shoulder pain (Location of Primary Source of Pain) (Bilateral) (L>R), Chronic foot pain, unspecified laterality, Chronic hand pain, unspecified laterality, Lumbar facet syndrome (Bilateral) (L>R), Lumbar spondylosis, unspecified spinal  osteoarthritis, Chronic sacroiliac joint pain (B) (L>R), Neurogenic pain, Radicular pain of shoulder (Right), Occipital pain (Right), Chronic cervical radicular pain (Right), Cervical spondylosis, Cervical facet arthropathy, Chronic neck pain (Bilateral) (R>L), Cervical facet syndrome (Bilateral) (R>L), Chronic pain of both upper extremities, Cervical foraminal stenosis,  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, C5-6), Arthropathy of shoulder (Right), Primary osteoarthritis of both shoulders, 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  Visit Diagnosis: 1. Chronic pain   2. Encounter for therapeutic drug level monitoring   3. Long term current use of opiate analgesic   4. Opiate use (60 MME/Day)   5. Chronic shoulder pain (Location of Primary Source of Pain) (Bilateral) (L>R)   6. Chronic foot pain, unspecified laterality   7. Chronic hand pain, unspecified laterality   8. Lumbar facet syndrome (Bilateral) (L>R)   9. Lumbar spondylosis, unspecified spinal osteoarthritis   10. Chronic sacroiliac joint pain (B) (L>R)   11. Neurogenic pain   12. Radicular pain of shoulder (Right)   13. Occipital pain (Right)   14. Chronic cervical radicular pain (Right)   15. Cervical spondylosis   16. Cervical facet arthropathy   17. Chronic neck pain (Bilateral) (R>L)   18. Cervical facet syndrome (Bilateral) (R>L)   19. Chronic pain of both upper extremities   20.  Cervical foraminal stenosis   21.  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 Arthropathy of shoulder (Right)   23. Primary osteoarthritis of both shoulders   24. 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 Lumbar foraminal stenosis (L4-5) (Left)   26. Grade 1 Anterolisthesis (2 mm) of L4 over L5.   27. Lumbar facet (L4-5) synovial cyst (6 mm) (Left)   28. Carpal tunnel syndrome (Bilateral)   29. Chronic abdominal pain (RUQ)   30. Chronic hip pain (Left)   31. Chronic low back pain (Bilateral) (L>R)   32. Chronic lumbar radicular pain (S1 Dermatome) (Bilateral) (R>L)   33. Chronic sacroiliac joint pain (Left)   34. Chronic shoulder pain (Right)   35. Complete tear of the shoulder rotator cuff (Left)   36. Failed back surgical syndrome 2   37. Lumbar facet hypertrophy     Problems updated and reviewed during this visit: Problem  Cervical Spondylosis  Cervical facet arthropathy  Chronic neck pain (Bilateral) (R>L)  Cervical facet syndrome (Bilateral) (R>L)  Chronic upper extremity pain (Bilateral) (R>L)   Accompanied by bilateral upper extremity weakness and numbness.    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 Supraspinatus tendinopathy. Partial-thickness tear of the distal infraspinatus tendon. Biceps tendinopathy. Glenohumeral joint: Chondral thinning. Degenerative subcortical cyst in the posterior and central portion of the greater tuberosity. Acromioclavicular joint: Subacromial/subdeltoid bursa fluid. Acromioclavicular joint spurring impinges on supraspinatus muscle. DJD of acromioclavicular joint.   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  Lumbar facet (L4-5) synovial cyst (6 mm) (Left)   Posterior spinal canal on  the left causing impingement on the left S1 nerve root.   Chronic sacroiliac joint pain (Bilateral) (L>R)  Carpal tunnel syndrome (Bilateral)  Chronic cervical radicular pain (Right)  Chronic low back pain (Bilateral) (L>R)  Chronic lumbar radicular pain (S1 Dermatome) (Bilateral) (L>R)   The pain goes to the bottom of his feet, bilaterally.   Cervical foraminal stenosis   Right-sided at C3-4 affecting right C4 nerve root. (Possible symptoms: Neck and shoulder pain on the right side) Bilateral (R>L) at C4-5 affecting C5 nerve root. (Possible symptoms: Bilateral shoulder and arm pain, not going into the hand with weakness on elbow flexion and shoulder Adduction)  Bilateral at C5-6 affecting C6 nerve root. (Possible symptoms: Bilateral thumb pain with weakness on elbow flexion and wrist extension) Bilateral at C6-7 affecting C7 nerve root. (Possible symptoms: Bilateral index and middle finger pain and/or numbness with weakness on elbow extension, wrist flexion, and finger extension)     Problem-specific Plan(s): No problem-specific assessment & plan notes found for this encounter.  No new assessment & plan notes have been filed under this hospital service since the last note was generated. Service: Pain Management   Plan of Care   Problem List Items Addressed This Visit      High    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, C5-6) (Chronic)   Relevant Orders   CERVICAL EPIDURAL STEROID INJECTION   Arthropathy of shoulder (Right) (Chronic)   Relevant Orders   SHOULDER INJECTION   SUPRASCAPULAR NERVE BLOCK   Carpal tunnel syndrome (Bilateral) (Chronic)   Relevant Medications   gabapentin (NEURONTIN) 100 MG capsule   Cervical facet arthropathy (Chronic)   Relevant Orders   CERVICAL FACET (MEDIAL BRANCH NERVE BLOCK)    Cervical facet syndrome (Bilateral) (R>L) (Chronic)   Relevant Orders   CERVICAL FACET (MEDIAL BRANCH NERVE BLOCK)    Cervical spondylosis (Chronic)   Relevant  Medications   Oxycodone HCl 10 MG TABS   Oxycodone HCl 10 MG TABS   Oxycodone HCl 10 MG TABS   Other Relevant Orders   CERVICAL FACET (MEDIAL BRANCH NERVE BLOCK)    CERVICAL EPIDURAL STEROID INJECTION   Chronic abdominal pain (RUQ) (Chronic)   Chronic cervical radicular pain (Right) (Chronic)   Relevant Medications   gabapentin (NEURONTIN) 100 MG capsule   Other Relevant Orders   CERVICAL EPIDURAL STEROID INJECTION   Chronic foot pain (Location of Secondary source of pain) (Bilateral) (L>R) (Chronic)   Chronic hand pain (Location of Tertiary source of pain) (Bilateral) (R>L) (Chronic)   Relevant Medications   Oxycodone HCl 10 MG TABS   Oxycodone HCl 10 MG TABS   Oxycodone HCl 10 MG TABS   gabapentin (NEURONTIN) 100 MG capsule   Chronic hip pain (Left) (Chronic)   Relevant Orders   HIP INJECTION   Chronic low back pain (Bilateral) (L>R) (Chronic)   Relevant Medications   Oxycodone HCl 10 MG TABS   Oxycodone HCl 10 MG TABS   Oxycodone HCl 10 MG TABS   Other Relevant Orders   LUMBAR FACET(MEDIAL BRANCH NERVE BLOCK) MBNB   Chronic lumbar radicular pain (S1 Dermatome) (Bilateral) (L>R) (Chronic)   Relevant Orders   Lumbar Transforaminal epidural without steroid   LUMBAR EPIDURAL STEROID INJECTION   Chronic neck pain (Bilateral) (R>L) (Chronic)   Relevant Medications   Oxycodone HCl 10 MG TABS   Oxycodone HCl 10 MG TABS   Oxycodone HCl 10 MG TABS   gabapentin (NEURONTIN) 100 MG capsule   Other Relevant Orders   CERVICAL FACET (MEDIAL BRANCH NERVE BLOCK)    Chronic pain - Primary (Chronic)   Relevant Medications   Oxycodone HCl 10 MG TABS   Oxycodone HCl 10 MG TABS   Oxycodone HCl 10 MG TABS   gabapentin (NEURONTIN) 100 MG capsule   Chronic sacroiliac joint pain (Bilateral) (L>R) (Chronic)   Relevant Medications   Oxycodone HCl 10 MG TABS   Oxycodone HCl 10 MG TABS   Oxycodone HCl 10 MG TABS   Other Relevant Orders   SACROILIAC JOINT INJECTINS   SACROILIAC JOINT  INJECTINS   Chronic shoulder pain (Location of Primary Source of Pain) (Bilateral) (L>R) (Chronic)  Relevant Orders   SHOULDER INJECTION   SUPRASCAPULAR NERVE BLOCK   Chronic upper extremity pain (Bilateral) (R>L) (Chronic)   Relevant Medications   Oxycodone HCl 10 MG TABS   Oxycodone HCl 10 MG TABS   Oxycodone HCl 10 MG TABS   gabapentin (NEURONTIN) 100 MG capsule   Other Relevant Orders   CERVICAL EPIDURAL STEROID INJECTION   Complete tear of the shoulder rotator cuff (Left)   Relevant Orders   SHOULDER INJECTION   SUPRASCAPULAR NERVE BLOCK   Failed back surgical syndrome 2 (Chronic)   Relevant Medications   Oxycodone HCl 10 MG TABS   Oxycodone HCl 10 MG TABS   Oxycodone HCl 10 MG TABS   Other Relevant Orders   Lumbar Transforaminal epidural without steroid   LUMBAR EPIDURAL STEROID INJECTION   Grade 1 Anterolisthesis (2 mm) of L4 over L5. 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) (Chronic)   Relevant Orders   Lumbar Transforaminal epidural without steroid   LUMBAR EPIDURAL STEROID INJECTION   Lumbar facet (L4-5) synovial cyst (6 mm) (Left) (Chronic)   Relevant Orders   LUMBAR FACET(MEDIAL BRANCH NERVE BLOCK) MBNB   Lumbar facet hypertrophy (Chronic)   Relevant Orders   LUMBAR FACET(MEDIAL BRANCH NERVE BLOCK) MBNB   Lumbar facet syndrome (Bilateral) (L>R) (Chronic)   Relevant Medications   Oxycodone HCl 10 MG TABS   Oxycodone HCl 10 MG TABS   Oxycodone HCl 10 MG TABS   Other Relevant Orders   LUMBAR FACET(MEDIAL BRANCH NERVE BLOCK) MBNB   LUMBAR FACET(MEDIAL BRANCH NERVE BLOCK) MBNB   Lumbar foraminal stenosis (L4-5) (Left) (Chronic)   Relevant Orders   Lumbar Transforaminal epidural without steroid   Lumbar spondylosis (Chronic)   Relevant Medications   Oxycodone HCl 10 MG TABS   Oxycodone HCl 10 MG TABS   Oxycodone HCl 10 MG TABS   Other Relevant Orders   LUMBAR FACET(MEDIAL  BRANCH NERVE BLOCK) MBNB   LUMBAR FACET(MEDIAL BRANCH NERVE BLOCK) MBNB   Neurogenic pain (Chronic)   Relevant Medications   gabapentin (NEURONTIN) 100 MG capsule   Occipital pain (Right) (Chronic)   Relevant Medications   Oxycodone HCl 10 MG TABS   Oxycodone HCl 10 MG TABS   Oxycodone HCl 10 MG TABS   gabapentin (NEURONTIN) 100 MG capsule   Other Relevant Orders   GREATER OCCIPITAL NERVE BLOCK   Osteoarthritis of shoulder (Bilateral) (R>L) (Chronic)   Relevant Medications   Oxycodone HCl 10 MG TABS   Oxycodone HCl 10 MG TABS   Oxycodone HCl 10 MG TABS   Other Relevant Orders   SHOULDER INJECTION   SUPRASCAPULAR NERVE BLOCK   Radicular pain of shoulder (Right) (Chronic)   Relevant Medications   gabapentin (NEURONTIN) 100 MG capsule   Other Relevant Orders   CERVICAL EPIDURAL STEROID INJECTION     Medium   Encounter for therapeutic drug level monitoring   Long term current use of opiate analgesic (Chronic)   Opiate use (60 MME/Day) (Chronic)     Unprioritized   Cervical foraminal stenosis   Relevant Orders   CERVICAL EPIDURAL STEROID INJECTION    Other Visit Diagnoses    Chronic sacroiliac joint pain (Left)  (Chronic)       Relevant Medications    Oxycodone HCl 10 MG TABS    Oxycodone HCl 10 MG TABS    Oxycodone HCl 10 MG TABS    Other Relevant Orders  SACROILIAC JOINT INJECTINS    Chronic shoulder pain (Right)  (Chronic)       Relevant Orders    SHOULDER INJECTION    SUPRASCAPULAR NERVE BLOCK        Pharmacotherapy (Medications Ordered): Meds ordered this encounter  Medications  . Oxycodone HCl 10 MG TABS    Sig: Take 1 tablet (10 mg total) by mouth every 6 (six) hours as needed.    Dispense:  120 tablet    Refill:  0    Do not place this medication, or any other prescription from our practice, on "Automatic Refill". Patient may have prescription filled one day early if pharmacy is closed on scheduled refill date. Do not fill until: 01/01/16 To last  until: 01/31/16  . Oxycodone HCl 10 MG TABS    Sig: Take 1 tablet (10 mg total) by mouth every 6 (six) hours as needed.    Dispense:  120 tablet    Refill:  0    Do not place this medication, or any other prescription from our practice, on "Automatic Refill". Patient may have prescription filled one day early if pharmacy is closed on scheduled refill date. Do not fill until: 01/31/16 To last until: 03/01/16  . Oxycodone HCl 10 MG TABS    Sig: Take 1 tablet (10 mg total) by mouth every 6 (six) hours as needed.    Dispense:  120 tablet    Refill:  0    Do not place this medication, or any other prescription from our practice, on "Automatic Refill". Patient may have prescription filled one day early if pharmacy is closed on scheduled refill date. Do not fill until: 03/01/16 To last until: 03/31/16  . gabapentin (NEURONTIN) 100 MG capsule    Sig: Take 1 capsule (100 mg total) by mouth every 8 (eight) hours. Follow titration schedule.    Dispense:  90 capsule    Refill:  2    Do not place this medication, or any other prescription from our practice, on "Automatic Refill". Patient may have prescription filled one day early if pharmacy is closed on scheduled refill date.    Lab-work & Procedure Ordered: Orders Placed This Encounter  Procedures  . SACROILIAC JOINT INJECTINS  . LUMBAR FACET(MEDIAL BRANCH NERVE BLOCK) MBNB  . CERVICAL FACET (MEDIAL BRANCH NERVE BLOCK)   . CERVICAL EPIDURAL STEROID INJECTION  . GREATER OCCIPITAL NERVE BLOCK  . SHOULDER INJECTION  . SUPRASCAPULAR NERVE BLOCK  . SACROILIAC JOINT INJECTINS  . LUMBAR FACET(MEDIAL BRANCH NERVE BLOCK) MBNB  . Lumbar Transforaminal epidural without steroid  . LUMBAR EPIDURAL STEROID INJECTION  . HIP INJECTION    Imaging Ordered: None  Interventional Therapies: Scheduled: None at this time.    Considering: 1. Diagnostic bilateral cervical facet block under fluoroscopic guidance and IV sedation. 2. Possible bilateral  cervical facet radiofrequency ablation under fluoroscopic guidance and IV sedation. 3. Diagnostic right-sided greater occipital nerve block under fluoroscopic guidance, with a without sedation. 4. Possible right-sided greater occipital nerve radiofrequency ablation under fluoroscopic guidance and IV sedation. 5. Possible right-sided peripheral nerve stimulator trial under fluoroscopic guidance and IV sedation. 6. Diagnostic right-sided cervical epidural steroid injection under fluoroscopic guidance, with a without sedation. 7. Diagnostic bilateral intra-articular shoulder injection under fluoroscopic guidance, with or without sedation. 8. Diagnostic bilateral suprascapular nerve block under fluoroscopic guidance, with or without sedation. 9. Possible bilateral suprascapular nerve radiofrequency ablation under fluoroscopic guidance and IV sedation. 10. Diagnostic bilateral sacroiliac joint block under fluoroscopic guidance, with a without  sedation. 11. Possible bilateral sacroiliac joint radiofrequency ablation under fluoroscopic guidance and IV sedation. 12. Diagnostic bilateral lumbar facet block under fluoroscopic guidance and IV sedation. 13. Possible bilateral lumbar facet radiofrequency ablation under fluoroscopic guidance and IV sedation. 14. Diagnostic left L4-5 transforaminal epidural steroid injection under fluoroscopic guidance, with a without sedation. 15. Diagnostic left-sided L4-5 lumbar epidural steroid injection under fluoroscopic guidance with or without sedation.  16. Diagnostic left intra-articular hip joint injection under fluoroscopic guidance, with or without sedation.  17. Possible left hip joint radiofrequency ablation under fluoroscopic guidance and IV sedation.  18. Diagnostic bilateral median nerve block (carpal tunnel block) without fluoroscopy or IV sedation.  19. Diagnostic right-sided celiac plexus block under fluoroscopic guidance and IV sedation for the abdominal  pain.   PRN Procedures: 1. Diagnostic bilateral cervical facet block under fluoroscopic guidance and IV sedation. 2. Diagnostic right-sided greater occipital nerve block under fluoroscopic guidance, with a without sedation. 3. Diagnostic right-sided cervical epidural steroid injection under fluoroscopic guidance, with a without sedation. 4. Diagnostic bilateral intra-articular shoulder injection under fluoroscopic guidance, with or without sedation. 5. Diagnostic bilateral suprascapular nerve block under fluoroscopic guidance, with or without sedation. 6. Diagnostic bilateral sacroiliac joint block under fluoroscopic guidance, with a without sedation.. 7. Diagnostic bilateral lumbar facet block under fluoroscopic guidance and IV sedation. 8. Diagnostic left L4-5 transforaminal epidural steroid injection under fluoroscopic guidance, with a without sedation. 9. Diagnostic left-sided L4-5 lumbar epidural steroid injection under fluoroscopic guidance with or without sedation. 10. Diagnostic left intra-articular hip joint injection under fluoroscopic guidance, with or without sedation.        Referral(s) or Consult(s): None at this time.  New Prescriptions   GABAPENTIN (NEURONTIN) 100 MG CAPSULE    Take 1 capsule (100 mg total) by mouth every 8 (eight) hours. Follow titration schedule.    Medications administered during this visit: Mr. Hayter had no medications administered during this visit.  Requested PM Follow-up: Return in about 3 months (around 03/19/2016) for Procedure (Scheduled), Medication Management, (3-Mo).  Future Appointments Date Time Provider [place]  01/05/2016 1:15 PM Milinda Pointer, MD [facility] None  03/19/2016 10:20 AM Milinda Pointer, MD [facility] None  06/19/2016 11:00 AM Pearletha Alfred None    Primary Care Physician: Howard Rake, MD [facility] Note by: Kathlen Brunswick. Dossie Arbour, M.D, DABA, DABAPM,  DABPM, DABIPP, FIPP  Pain Score Disclaimer: We use the NRS-11 scale. This is a self-reported, subjective measurement of pain severity with only modest accuracy. It is used primarily to identify changes within a particular patient. It must be understood that outpatient pain scales are significantly less accurate that those used for research, where they can be applied under ideal controlled circumstances with minimal exposure to variables. In reality, the score is likely to be a combination of pain intensity and pain affect, where pain affect describes the degree of emotional arousal or changes in action readiness caused by the sensory experience of pain. Factors such as social and work situation, setting, emotional state, anxiety levels, expectation, and prior pain experience may influence pain perception and show large inter-individual differences that may also be affected by time variables.  Patient instructions provided during this appointment: Patient Instructions  Sacroiliac (SI) Joint Injection Patient Information  Description: The sacroiliac joint connects the scrum (very low back and tailbone) to the ilium (a pelvic bone which also forms half of the hip joint).  Normally this joint experiences very little motion.  When this joint becomes  inflamed or unstable low back and or hip and pelvis pain may result.  Injection of this joint with local anesthetics (numbing medicines) and steroids can provide diagnostic information and reduce pain.  This injection is performed with the aid of x-ray guidance into the tailbone area while you are lying on your stomach.   You may experience an electrical sensation down the leg while this is being done.  You may also experience numbness.  We also may ask if we are reproducing your normal pain during the injection.  Conditions which may be treated SI injection:   Low back, buttock, hip or leg pain  Preparation for the Injection:  1. Do not eat any solid food  or dairy products within 8 hours of your appointment.  2. You may drink clear liquids up to 3 hours before appointment.  Clear liquids include water, black coffee, juice or soda.  No milk or cream please. 3. You may take your regular medications, including pain medications with a sip of water before your appointment.  Diabetics should hold regular insulin (if take separately) and take 1/2 normal NPH dose the morning of the procedure.  Carry some sugar containing items with you to your appointment. 4. A driver must accompany you and be prepared to drive you home after your procedure. 5. Bring all of your current medications with you. 6. An IV may be inserted and sedation may be given at the discretion of the physician. 7. A blood pressure cuff, EKG and other monitors will often be applied during the procedure.  Some patients may need to have extra oxygen administered for a short period.  8. You will be asked to provide medical information, including your allergies, prior to the procedure.  We must know immediately if you are taking blood thinners (like Coumadin/Warfarin) or if you are allergic to IV iodine contrast (dye).  We must know if you could possible be pregnant.  Possible side effects:   Bleeding from needle site  Infection (rare, may require surgery)  Nerve injury (rare)  Numbness & tingling (temporary)  A brief convulsion or seizure  Light-headedness (temporary)  Pain at injection site (several days)  Decreased blood pressure (temporary)  Weakness in the leg (temporary)   Call if you experience:   New onset weakness or numbness of an extremity below the injection site that last more than 8 hours.  Hives or difficulty breathing ( go to the emergency room)  Inflammation or drainage at the injection site  Any new symptoms which are concerning to you  Please note:  Although the local anesthetic injected can often make your back/ hip/ buttock/ leg feel good for several  hours after the injections, the pain will likely return.  It takes 3-7 days for steroids to work in the sacroiliac area.  You may not notice any pain relief for at least that one week.  If effective, we will often do a series of three injections spaced 3-6 weeks apart to maximally decrease your pain.  After the initial series, we generally will wait some months before a repeat injection of the same type.  If you have any questions, please call [phone] [facility]  Facet Blocks Patient Information  Description: The facets are joints in the spine between the vertebrae.  Like any joints in the body, facets can become irritated and painful.  Arthritis can also effect the facets.  By injecting steroids and local anesthetic in and around these joints,  we can temporarily block the nerve supply to them.  Steroids act directly on irritated nerves and tissues to reduce selling and inflammation which often leads to decreased pain.  Facet blocks may be done anywhere along the spine from the neck to the low back depending upon the location of your pain.   After numbing the skin with local anesthetic (like Novocaine), a small needle is passed onto the facet joints under x-ray guidance.  You may experience a sensation of pressure while this is being done.  The entire block usually lasts about 15-25 minutes.   Conditions which may be treated by facet blocks:   Low back/buttock pain  Neck/shoulder pain  Certain types of headaches  Preparation for the injection:  10. Do not eat any solid food or dairy products within 8 hours of your appointment. 11. You may drink clear liquid up to 3 hours before appointment.  Clear liquids include water, black coffee, juice or soda.  No milk or cream please. 12. You may take your regular medication, including pain medications, with a sip of water before your appointment.  Diabetics should hold regular insulin (if taken separately) and  take 1/2 normal NPH dose the morning of the procedure.  Carry some sugar containing items with you to your appointment. 13. A driver must accompany you and be prepared to drive you home after your procedure. 14. Bring all your current medications with you. 15. An IV may be inserted and sedation may be given at the discretion of the physician. 16. A blood pressure cuff, EKG and other monitors will often be applied during the procedure.  Some patients may need to have extra oxygen administered for a short period. 67. You will be asked to provide medical information, including your allergies and medications, prior to the procedure.  We must know immediately if you are taking blood thinners (like Coumadin/Warfarin) or if you are allergic to IV iodine contrast (dye).  We must know if you could possible be pregnant.  Possible side-effects:   Bleeding from needle site  Infection (rare, may require surgery)  Nerve injury (rare)  Numbness & tingling (temporary)  Difficulty urinating (rare, temporary)  Spinal headache (a headache worse with upright posture)  Light-headedness (temporary)  Pain at injection site (serveral days)  Decreased blood pressure (rare, temporary)  Weakness in arm/leg (temporary)  Pressure sensation in back/neck (temporary)   Call if you experience:   Fever/chills associated with headache or increased back/neck pain  Headache worsened by an upright position  New onset, weakness or numbness of an extremity below the injection site  Hives or difficulty breathing (go to the emergency room)  Inflammation or drainage at the injection site(s)  Severe back/neck pain greater than usual  New symptoms which are concerning to you  Please note:  Although the local anesthetic injected can often make your back or neck feel good for several hours after the injection, the pain will likely return. It takes 3-7 days for steroids to work.  You may not notice any pain  relief for at least one week.  If effective, we will often do a series of 2-3 injections spaced 3-6 weeks apart to maximally decrease your pain.  After the initial series, you may be a candidate for a more permanent nerve block of the facets.  If you have any questions, please call #[phone]) [facility]

## 2015-12-08 NOTE — Progress Notes (Signed)
Safety precautions to be maintained throughout the outpatient stay will include: orient to surroundings, keep bed in low position, maintain call bell within reach at all times, provide assistance with transfer out of bed and ambulation.   Oxycodone 10 mg #97 out of 120 remaining. Filled 12-02-15.

## 2015-12-08 NOTE — Patient Instructions (Signed)
Sacroiliac (SI) Joint Injection Patient Information  Description: The sacroiliac joint connects the scrum (very low back and tailbone) to the ilium (a pelvic bone which also forms half of the hip joint).  Normally this joint experiences very little motion.  When this joint becomes inflamed or unstable low back and or hip and pelvis pain may result.  Injection of this joint with local anesthetics (numbing medicines) and steroids can provide diagnostic information and reduce pain.  This injection is performed with the aid of x-ray guidance into the tailbone area while you are lying on your stomach.   You may experience an electrical sensation down the leg while this is being done.  You may also experience numbness.  We also may ask if we are reproducing your normal pain during the injection.  Conditions which may be treated SI injection:   Low back, buttock, hip or leg pain  Preparation for the Injection:  1. Do not eat any solid food or dairy products within 8 hours of your appointment.  2. You may drink clear liquids up to 3 hours before appointment.  Clear liquids include water, black coffee, juice or soda.  No milk or cream please. 3. You may take your regular medications, including pain medications with a sip of water before your appointment.  Diabetics should hold regular insulin (if take separately) and take 1/2 normal NPH dose the morning of the procedure.  Carry some sugar containing items with you to your appointment. 4. A driver must accompany you and be prepared to drive you home after your procedure. 5. Bring all of your current medications with you. 6. An IV may be inserted and sedation may be given at the discretion of the physician. 7. A blood pressure cuff, EKG and other monitors will often be applied during the procedure.  Some patients may need to have extra oxygen administered for a short period.  8. You will be asked to provide medical information, including your allergies,  prior to the procedure.  We must know immediately if you are taking blood thinners (like Coumadin/Warfarin) or if you are allergic to IV iodine contrast (dye).  We must know if you could possible be pregnant.  Possible side effects:   Bleeding from needle site  Infection (rare, may require surgery)  Nerve injury (rare)  Numbness & tingling (temporary)  A brief convulsion or seizure  Light-headedness (temporary)  Pain at injection site (several days)  Decreased blood pressure (temporary)  Weakness in the leg (temporary)   Call if you experience:   New onset weakness or numbness of an extremity below the injection site that last more than 8 hours.  Hives or difficulty breathing ( go to the emergency room)  Inflammation or drainage at the injection site  Any new symptoms which are concerning to you  Please note:  Although the local anesthetic injected can often make your back/ hip/ buttock/ leg feel good for several hours after the injections, the pain will likely return.  It takes 3-7 days for steroids to work in the sacroiliac area.  You may not notice any pain relief for at least that one week.  If effective, we will often do a series of three injections spaced 3-6 weeks apart to maximally decrease your pain.  After the initial series, we generally will wait some months before a repeat injection of the same type.  If you have any questions, please call [phone] [facility]  Facet Blocks Patient   Information  Description: The facets are joints in the spine between the vertebrae.  Like any joints in the body, facets can become irritated and painful.  Arthritis can also effect the facets.  By injecting steroids and local anesthetic in and around these joints, we can temporarily block the nerve supply to them.  Steroids act directly on irritated nerves and tissues to reduce selling and inflammation which often leads to decreased pain.   Facet blocks may be done anywhere along the spine from the neck to the low back depending upon the location of your pain.   After numbing the skin with local anesthetic (like Novocaine), a small needle is passed onto the facet joints under x-ray guidance.  You may experience a sensation of pressure while this is being done.  The entire block usually lasts about 15-25 minutes.   Conditions which may be treated by facet blocks:   Low back/buttock pain  Neck/shoulder pain  Certain types of headaches  Preparation for the injection:  10. Do not eat any solid food or dairy products within 8 hours of your appointment. 11. You may drink clear liquid up to 3 hours before appointment.  Clear liquids include water, black coffee, juice or soda.  No milk or cream please. 12. You may take your regular medication, including pain medications, with a sip of water before your appointment.  Diabetics should hold regular insulin (if taken separately) and take 1/2 normal NPH dose the morning of the procedure.  Carry some sugar containing items with you to your appointment. 13. A driver must accompany you and be prepared to drive you home after your procedure. 14. Bring all your current medications with you. 15. An IV may be inserted and sedation may be given at the discretion of the physician. 16. A blood pressure cuff, EKG and other monitors will often be applied during the procedure.  Some patients may need to have extra oxygen administered for a short period. 17. You will be asked to provide medical information, including your allergies and medications, prior to the procedure.  We must know immediately if you are taking blood thinners (like Coumadin/Warfarin) or if you are allergic to IV iodine contrast (dye).  We must know if you could possible be pregnant.  Possible side-effects:   Bleeding from needle site  Infection (rare, may require surgery)  Nerve injury (rare)  Numbness & tingling  (temporary)  Difficulty urinating (rare, temporary)  Spinal headache (a headache worse with upright posture)  Light-headedness (temporary)  Pain at injection site (serveral days)  Decreased blood pressure (rare, temporary)  Weakness in arm/leg (temporary)  Pressure sensation in back/neck (temporary)   Call if you experience:   Fever/chills associated with headache or increased back/neck pain  Headache worsened by an upright position  New onset, weakness or numbness of an extremity below the injection site  Hives or difficulty breathing (go to the emergency room)  Inflammation or drainage at the injection site(s)  Severe back/neck pain greater than usual  New symptoms which are concerning to you  Please note:  Although the local anesthetic injected can often make your back or neck feel good for several hours after the injection, the pain will likely return. It takes 3-7 days for steroids to work.  You may not notice any pain relief for at least one week.  If effective, we will often do a series of 2-3 injections spaced 3-6 weeks apart to maximally decrease your pain.  After the initial series, you   may be a candidate for a more permanent nerve block of the facets.  If you have any questions, please call [phone] [facility] 

## 2015-12-17 ENCOUNTER — Encounter: Payer: Self-pay | Admitting: Pain Medicine

## 2015-12-17 DIAGNOSIS — M7138 Other bursal cyst, other site: Secondary | ICD-10-CM | POA: Insufficient documentation

## 2015-12-17 DIAGNOSIS — M47812 Spondylosis without myelopathy or radiculopathy, cervical region: Secondary | ICD-10-CM | POA: Insufficient documentation

## 2015-12-17 DIAGNOSIS — M1990 Unspecified osteoarthritis, unspecified site: Secondary | ICD-10-CM

## 2015-12-17 DIAGNOSIS — M542 Cervicalgia: Secondary | ICD-10-CM

## 2015-12-17 DIAGNOSIS — M19011 Primary osteoarthritis, right shoulder: Secondary | ICD-10-CM | POA: Insufficient documentation

## 2015-12-17 DIAGNOSIS — M48061 Spinal stenosis, lumbar region without neurogenic claudication: Secondary | ICD-10-CM | POA: Insufficient documentation

## 2015-12-17 DIAGNOSIS — M79603 Pain in arm, unspecified: Secondary | ICD-10-CM

## 2015-12-17 DIAGNOSIS — M4310 Spondylolisthesis, site unspecified: Secondary | ICD-10-CM | POA: Insufficient documentation

## 2015-12-17 DIAGNOSIS — M4802 Spinal stenosis, cervical region: Secondary | ICD-10-CM | POA: Insufficient documentation

## 2015-12-17 DIAGNOSIS — G8929 Other chronic pain: Secondary | ICD-10-CM | POA: Insufficient documentation

## 2015-12-22 ENCOUNTER — Telehealth: Payer: Self-pay | Admitting: Urology

## 2015-12-30 ENCOUNTER — Telehealth: Payer: Self-pay | Admitting: Family Medicine

## 2015-12-30 NOTE — Telephone Encounter (Signed)
THE GIRLFRIEND  Howard Murphy  ( YOUR PATIENT) IS ASKING TO MAKE YOU AS HER BOYFRIEND ( Howard Murphy)  AS HIS PCP DR.  I NEED TO KNOW IF YOU OKAYED THIS FOR HE IS A DR [facility] PATIENT.

## 2015-12-30 NOTE — Telephone Encounter (Signed)
Yes, that's fine. Thank you for checking.

## 2016-01-02 NOTE — Telephone Encounter (Signed)
error 

## 2016-01-05 ENCOUNTER — Ambulatory Visit: Payer: Medicare Other | Attending: Pain Medicine | Admitting: Pain Medicine

## 2016-01-05 ENCOUNTER — Encounter: Payer: Self-pay | Admitting: Pain Medicine

## 2016-01-05 VITALS — BP 129/81 | HR 70 | Temp 97.8°F | Resp 12 | Ht 66.0 in | Wt 185.0 lb

## 2016-01-05 DIAGNOSIS — M069 Rheumatoid arthritis, unspecified: Secondary | ICD-10-CM | POA: Diagnosis not present

## 2016-01-05 DIAGNOSIS — M5412 Radiculopathy, cervical region: Secondary | ICD-10-CM | POA: Diagnosis not present

## 2016-01-05 DIAGNOSIS — R51 Headache: Secondary | ICD-10-CM | POA: Insufficient documentation

## 2016-01-05 DIAGNOSIS — K219 Gastro-esophageal reflux disease without esophagitis: Secondary | ICD-10-CM | POA: Insufficient documentation

## 2016-01-05 DIAGNOSIS — G5603 Carpal tunnel syndrome, bilateral upper limbs: Secondary | ICD-10-CM | POA: Diagnosis not present

## 2016-01-05 DIAGNOSIS — R079 Chest pain, unspecified: Secondary | ICD-10-CM | POA: Insufficient documentation

## 2016-01-05 DIAGNOSIS — M19012 Primary osteoarthritis, left shoulder: Secondary | ICD-10-CM | POA: Diagnosis not present

## 2016-01-05 DIAGNOSIS — M4802 Spinal stenosis, cervical region: Secondary | ICD-10-CM | POA: Insufficient documentation

## 2016-01-05 DIAGNOSIS — I10 Essential (primary) hypertension: Secondary | ICD-10-CM | POA: Diagnosis not present

## 2016-01-05 DIAGNOSIS — R1011 Right upper quadrant pain: Secondary | ICD-10-CM | POA: Insufficient documentation

## 2016-01-05 DIAGNOSIS — M5442 Lumbago with sciatica, left side: Secondary | ICD-10-CM | POA: Diagnosis not present

## 2016-01-05 DIAGNOSIS — M4806 Spinal stenosis, lumbar region: Secondary | ICD-10-CM | POA: Diagnosis not present

## 2016-01-05 DIAGNOSIS — D291 Benign neoplasm of prostate: Secondary | ICD-10-CM | POA: Insufficient documentation

## 2016-01-05 DIAGNOSIS — E1142 Type 2 diabetes mellitus with diabetic polyneuropathy: Secondary | ICD-10-CM | POA: Insufficient documentation

## 2016-01-05 DIAGNOSIS — M4316 Spondylolisthesis, lumbar region: Secondary | ICD-10-CM | POA: Insufficient documentation

## 2016-01-05 DIAGNOSIS — M25511 Pain in right shoulder: Secondary | ICD-10-CM | POA: Insufficient documentation

## 2016-01-05 DIAGNOSIS — E876 Hypokalemia: Secondary | ICD-10-CM | POA: Insufficient documentation

## 2016-01-05 DIAGNOSIS — M47816 Spondylosis without myelopathy or radiculopathy, lumbar region: Secondary | ICD-10-CM | POA: Insufficient documentation

## 2016-01-05 DIAGNOSIS — E781 Pure hyperglyceridemia: Secondary | ICD-10-CM | POA: Diagnosis not present

## 2016-01-05 DIAGNOSIS — M545 Low back pain: Secondary | ICD-10-CM | POA: Diagnosis not present

## 2016-01-05 DIAGNOSIS — E785 Hyperlipidemia, unspecified: Secondary | ICD-10-CM | POA: Diagnosis not present

## 2016-01-05 DIAGNOSIS — G545 Neuralgic amyotrophy: Secondary | ICD-10-CM | POA: Insufficient documentation

## 2016-01-05 DIAGNOSIS — E7800 Pure hypercholesterolemia, unspecified: Secondary | ICD-10-CM | POA: Insufficient documentation

## 2016-01-05 DIAGNOSIS — G8929 Other chronic pain: Secondary | ICD-10-CM | POA: Insufficient documentation

## 2016-01-05 DIAGNOSIS — M533 Sacrococcygeal disorders, not elsewhere classified: Secondary | ICD-10-CM | POA: Insufficient documentation

## 2016-01-05 DIAGNOSIS — M546 Pain in thoracic spine: Secondary | ICD-10-CM | POA: Diagnosis present

## 2016-01-05 MED ORDER — TRIAMCINOLONE ACETONIDE 40 MG/ML IJ SUSP
INTRAMUSCULAR | Status: AC
Start: 2016-01-05 — End: 2016-01-05
  Administered 2016-01-05: 14:00:00
  Filled 2016-01-05: qty 1

## 2016-01-05 MED ORDER — ROPIVACAINE HCL 2 MG/ML IJ SOLN
INTRAMUSCULAR | Status: AC
  Administered 2016-01-05: 14:00:00
  Filled 2016-01-05: qty 30

## 2016-01-05 MED ORDER — TRIAMCINOLONE ACETONIDE 40 MG/ML IJ SUSP: 40.0000 mg | Freq: Once | INTRAMUSCULAR | Status: DC

## 2016-01-05 MED ORDER — FENTANYL CITRATE (PF) 100 MCG/2ML IJ SOLN: 25.0000 ug | INTRAMUSCULAR | Status: DC | PRN

## 2016-01-05 MED ORDER — METHYLPREDNISOLONE ACETATE 80 MG/ML IJ SUSP: 80.0000 mg | Freq: Once | INTRAMUSCULAR | Status: DC

## 2016-01-05 MED ORDER — METHYLPREDNISOLONE ACETATE 80 MG/ML IJ SUSP
INTRAMUSCULAR | Status: AC
  Administered 2016-01-05: 14:00:00
  Filled 2016-01-05: qty 1

## 2016-01-05 MED ORDER — MIDAZOLAM HCL 5 MG/5ML IJ SOLN: 1.0000 mg | INTRAMUSCULAR | Status: DC | PRN

## 2016-01-05 MED ORDER — ROPIVACAINE HCL 2 MG/ML IJ SOLN: 9.0000 mL | Freq: Once | INTRAMUSCULAR | Status: DC

## 2016-01-05 MED ORDER — MIDAZOLAM HCL 5 MG/5ML IJ SOLN
INTRAMUSCULAR | Status: AC
  Administered 2016-01-05: 3 mg via INTRAVENOUS
  Filled 2016-01-05: qty 5

## 2016-01-05 MED ORDER — LACTATED RINGERS IV SOLN: 1000.0000 mL | Freq: Once | INTRAVENOUS | Status: DC

## 2016-01-05 MED ORDER — TRIAMCINOLONE ACETONIDE 40 MG/ML IJ SUSP
INTRAMUSCULAR | Status: AC
  Administered 2016-01-05: 14:00:00
  Filled 2016-01-05: qty 1

## 2016-01-05 MED ORDER — FENTANYL CITRATE (PF) 100 MCG/2ML IJ SOLN
INTRAMUSCULAR | Status: AC
  Administered 2016-01-05: 100 ug via INTRAVENOUS
  Filled 2016-01-05: qty 2

## 2016-01-05 MED ORDER — LIDOCAINE HCL (PF) 1 % IJ SOLN: 10.0000 mL | Freq: Once | INTRAMUSCULAR | Status: DC

## 2016-01-05 NOTE — Progress Notes (Signed)
Patient here for procedure visit d/t lower back pain.    Safety precautions to be maintained throughout the outpatient stay will include: orient to surroundings, keep bed in low position, maintain call bell within reach at all times, provide assistance with transfer out of bed and ambulation.

## 2016-01-05 NOTE — Patient Instructions (Signed)
IMPORTANT: Please fill out the post procedure pain diary and bring it with you at your next appointment.  Facet Blocks Patient Information  Description: The facets are joints in the spine between the vertebrae.  Like any joints in the body, facets can become irritated and painful.  Arthritis can also effect the facets.  By injecting steroids and local anesthetic in and around these joints, we can temporarily block the nerve supply to them.  Steroids act directly on irritated nerves and tissues to reduce selling and inflammation which often leads to decreased pain.  Facet blocks may be done anywhere along the spine from the neck to the low back depending upon the location of your pain.   After numbing the skin with local anesthetic (like Novocaine), a small needle is passed onto the facet joints under x-ray guidance.  You may experience a sensation of pressure while this is being done.  The entire block usually lasts about 15-25 minutes.   Conditions which may be treated by facet blocks:   Low back/buttock pain  Neck/shoulder pain  Certain types of headaches  Preparation for the injection:  1. Do not eat any solid food or dairy products within 8 hours of your appointment. 2. You may drink clear liquid up to 3 hours before appointment.  Clear liquids include water, black coffee, juice or soda.  No milk or cream please. 3. You may take your regular medication, including pain medications, with a sip of water before your appointment.  Diabetics should hold regular insulin (if taken separately) and take 1/2 normal NPH dose the morning of the procedure.  Carry some sugar containing items with you to your appointment. 4. A driver must accompany you and be prepared to drive you home after your procedure. 5. Bring all your current medications with you. 6. An IV may be inserted and sedation may be given at the discretion of the physician. 7. A blood pressure cuff, EKG and other monitors will often be  applied during the procedure.  Some patients may need to have extra oxygen administered for a short period. 8. You will be asked to provide medical information, including your allergies and medications, prior to the procedure.  We must know immediately if you are taking blood thinners (like Coumadin/Warfarin) or if you are allergic to IV iodine contrast (dye).  We must know if you could possible be pregnant.  Possible side-effects:   Bleeding from needle site  Infection (rare, may require surgery)  Nerve injury (rare)  Numbness & tingling (temporary)  Difficulty urinating (rare, temporary)  Spinal headache (a headache worse with upright posture)  Light-headedness (temporary)  Pain at injection site (serveral days)  Decreased blood pressure (rare, temporary)  Weakness in arm/leg (temporary)  Pressure sensation in back/neck (temporary)   Call if you experience:   Fever/chills associated with headache or increased back/neck pain  Headache worsened by an upright position  New onset, weakness or numbness of an extremity below the injection site  Hives or difficulty breathing (go to the emergency room)  Inflammation or drainage at the injection site(s)  Severe back/neck pain greater than usual  New symptoms which are concerning to you  Please note:  Although the local anesthetic injected can often make your back or neck feel good for several hours after the injection, the pain will likely return. It takes 3-7 days for steroids to work.  You may not notice any pain relief for at least one week.  If effective, we will often  do a series of 2-3 injections spaced 3-6 weeks apart to maximally decrease your pain.  After the initial series, you may be a candidate for a more permanent nerve block of the facets.  If you have any questions, please call #[phone]) [facility]  Sacroiliac (SI) Joint Injection Patient  Information  Description: The sacroiliac joint connects the scrum (very low back and tailbone) to the ilium (a pelvic bone which also forms half of the hip joint).  Normally this joint experiences very little motion.  When this joint becomes inflamed or unstable low back and or hip and pelvis pain may result.  Injection of this joint with local anesthetics (numbing medicines) and steroids can provide diagnostic information and reduce pain.  This injection is performed with the aid of x-ray guidance into the tailbone area while you are lying on your stomach.   You may experience an electrical sensation down the leg while this is being done.  You may also experience numbness.  We also may ask if we are reproducing your normal pain during the injection.  Conditions which may be treated SI injection:   Low back, buttock, hip or leg pain  Preparation for the Injection:  1. Do not eat any solid food or dairy products within 8 hours of your appointment.  2. You may drink clear liquids up to 3 hours before appointment.  Clear liquids include water, black coffee, juice or soda.  No milk or cream please. 3. You may take your regular medications, including pain medications with a sip of water before your appointment.  Diabetics should hold regular insulin (if take separately) and take 1/2 normal NPH dose the morning of the procedure.  Carry some sugar containing items with you to your appointment. 4. A driver must accompany you and be prepared to drive you home after your procedure. 5. Bring all of your current medications with you. 6. An IV may be inserted and sedation may be given at the discretion of the physician. 7. A blood pressure cuff, EKG and other monitors will often be applied during the procedure.  Some patients may need to have extra oxygen administered for a short period.  8. You will be asked to provide medical information, including your allergies, prior to the procedure.  We must know  immediately if you are taking blood thinners (like Coumadin/Warfarin) or if you are allergic to IV iodine contrast (dye).  We must know if you could possible be pregnant.  Possible side effects:   Bleeding from needle site  Infection (rare, may require surgery)  Nerve injury (rare)  Numbness & tingling (temporary)  A brief convulsion or seizure  Light-headedness (temporary)  Pain at injection site (several days)  Decreased blood pressure (temporary)  Weakness in the leg (temporary)   Call if you experience:   New onset weakness or numbness of an extremity below the injection site that last more than 8 hours.  Hives or difficulty breathing ( go to the emergency room)  Inflammation or drainage at the injection site  Any new symptoms which are concerning to you  Please note:  Although the local anesthetic injected can often make your back/ hip/ buttock/ leg feel good for several hours after the injections, the pain will likely return.  It takes 3-7 days for steroids to work in the sacroiliac area.  You may not notice any pain relief for at least that one week.  If effective, we will often do a  series of three injections spaced 3-6 weeks apart to maximally decrease your pain.  After the initial series, we generally will wait some months before a repeat injection of the same type.  If you have any questions, please call [phone] [facility]  Pain Management Discharge Instructions  General Discharge Instructions :  If you need to reach your doctor call: Monday-Friday 8:00 am - 4:00 pm at [phone] or toll free [phone].  After clinic hours [phone] to have operator reach doctor.  Bring all of your medication bottles to all your appointments in the pain clinic.  To cancel or reschedule your appointment with Pain Management please remember to call 24 hours in advance to avoid a fee.  Refer to the educational materials  which you have been given on: General Risks, I had my Procedure. Discharge Instructions, Post Sedation.  Post Procedure Instructions:  The drugs you were given will stay in your system until tomorrow, so for the next 24 hours you should not drive, make any legal decisions or drink any alcoholic beverages.  You may eat anything you prefer, but it is better to start with liquids then soups and crackers, and gradually work up to solid foods.  Please notify your doctor immediately if you have any unusual bleeding, trouble breathing or pain that is not related to your normal pain.  Depending on the type of procedure that was done, some parts of your body may feel week and/or numb.  This usually clears up by tonight or the next day.  Walk with the use of an assistive device or accompanied by an adult for the 24 hours.  You may use ice on the affected area for the first 24 hours.  Put ice in a Ziploc bag and cover with a towel and place against area 15 minutes on 15 minutes off.  You may switch to heat after 24 hours.

## 2016-01-05 NOTE — Progress Notes (Signed)
Patient's Name: Howard Murphy  Patient type: Established  MRN: [id number]  Service setting: Ambulatory outpatient  DOB: Oct 15, 1955  Location: [facility]  DOS: 01/05/2016  Primary Care Physician: Enid Derry, MD  Note by: Kathlen Brunswick. Dossie Arbour, M.D, DABA, DABAPM, DABPM, Milagros Evener, FIPP  Referring Physician: Milinda Pointer, MD  Specialty: Board-Certified Interventional Pain Management  Last Visit to Pain Management: 12/08/2015   Primary Reason(s) for Visit: Interventional Pain Management Treatment. CC: Back Pain (upper and lower)  Chronic low back pain [M54.5, G89.29]   Procedure:  Anesthesia, Analgesia, Anxiolysis:  Procedure #1: Type: Diagnostic Medial Branch Facet Block Region: Lumbar Level: L2, L3, L4, L5, & S1 Medial Branch Level(s) Laterality: Bilateral  Procedure #2: Type: Diagnostic Sacroiliac Joint Block Region: Posterior Lumbosacral Level: PSIS (Posterior Superior Iliac Spine) Sacroiliac Joint Laterality: Bilateral  Indications: 1. Chronic low back pain (Bilateral) (L>R)   2. Lumbar facet syndrome (Bilateral) (L>R)   3. Chronic sacroiliac joint pain (Bilateral) (L>R)   4. Lumbar spondylosis, unspecified spinal osteoarthritis     Pre-procedure Pain Score: 5/10 Reported level of pain is compatible with clinical observations Post-procedure Pain Score: 0-No pain  Type: Moderate (Conscious) Sedation & Local Anesthesia Local Anesthetic: Lidocaine 1% Route: Intravenous (IV) IV Access: Secured Sedation: Meaningful verbal contact was maintained at all times during the procedure  Indication(s): Analgesia & Anxiolysis   Pre-Procedure Assessment  Howard Murphy is a 60 y.o. year old, male patient, seen today for interventional treatment. He has Benign fibroma of prostate; Elevated hemoglobin ([place]); Acid reflux; BP (high blood pressure); Hypercholesteremia; HLD (hyperlipidemia); Arthritis or polyarthritis, rheumatoid ([place]); History of lumbar facet  Synovial cyst, surgically removed.; Rheumatoid arthritis ([place]); Breath shortness; Dyspnea; Chronic abdominal pain ([place]); Type 2 diabetes mellitus with peripheral neuropathy ([place]); Chest pain; Hypertriglyceridemia; Chronic pain; Opiate use (60 MME/Day); Long term prescription opiate use; Long term current use of opiate analgesic; Chronic low back pain (Bilateral) (L>R); Lumbar spondylosis; Lumbar facet syndrome (Bilateral) (L>R); Chronic hip pain (Left); Diabetic peripheral neuropathy ([place]); Chronic lumbar radicular pain (S1 Dermatome) (Bilateral) (L>R); Hypokalemia; Complete tear of the shoulder rotator cuff (Left); Chronic shoulder pain (Location of Primary Source of Pain) (Bilateral) (L>R); Non-insulin dependent type 2 diabetes mellitus ([place]); Chronic foot pain (Location of Secondary source of pain) (Bilateral) (L>R); Chronic hand pain (Location of Tertiary source of pain) (Bilateral) (R>L); Lumbar facet hypertrophy; Encounter for therapeutic drug level monitoring; Encounter for pain management planning; Low testosterone; Occipital pain (Right); Failed back surgical syndrome 2; Diuretic-induced hypokalemia; Chronic low back pain with left-sided sciatica; Radicular pain of shoulder (Right); Carpal tunnel syndrome (Bilateral); Chronic cervical radicular pain (Right); Abnormal MRI, lumbar spine; Chronic sacroiliac joint pain (Bilateral) (L>R); Neurogenic pain; Cervical spondylosis; Cervical facet arthropathy; Chronic neck pain (Bilateral) (R>L); Cervical facet syndrome (Bilateral) (R>L); Chronic upper extremity pain (Bilateral) (R>L); Cervical foraminal stenosis;  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 His primarily concern  today is the Back Pain (upper and lower)   Pain Type: Chronic pain Pain Location: Back Pain Orientation: Upper, Lower Pain Descriptors / Indicators: Aching, Burning, Constant Pain Frequency: Constant  Date of Last Visit: 12/08/15 Service Provided on Last Visit: Med Refill  Coagulation Parameters Lab Results  Component Value Date   PLT 300 07/15/2015  Verification of the correct person, correct site (including marking of site), and correct procedure were performed and confirmed by the patient.  Consent: Secured. Under the influence of no sedatives a written informed consent was obtained, after having provided information on the risks and possible complications. To fulfill our ethical and legal obligations, as recommended by the American Medical Association's Code of Ethics, we have provided information to the patient about our clinical impression; the nature and purpose of the treatment or procedure; the risks, benefits, and possible complications of the intervention; alternatives; the risk(s) and benefit(s) of the alternative treatment(s) or procedure(s); and the risk(s) and benefit(s) of doing nothing. The patient was provided information about the risks and possible complications associated with the procedure. These include, but are not limited to, failure to achieve desired goals, infection, bleeding, organ or nerve damage, allergic reactions, paralysis, and death. In the case of spinal procedures these may include, but are not limited to, failure to achieve desired goals, infection, bleeding, organ or nerve damage, allergic reactions, paralysis, and death. In the case of intra- or periarticular procedures these may include, but are not limited to, failure to achieve desired goals, infection, bleeding (hemarthrosis), organ or nerve damage, allergic reactions, and death. In addition, the patient was informed that Medicine is not an exact science; therefore, there is also the possibility of  unforeseen risks and possible complications that may result in a catastrophic outcome. The patient indicated having understood very clearly. We have given the patient no guarantees and we have made no promises. Enough time was given to the patient to ask questions, all of which were answered to the patient's satisfaction.  Consent Attestation: I, the ordering provider, attest that I have discussed with the patient the benefits, risks, side-effects, alternatives, likelihood of achieving goals, and potential problems during recovery for the procedure that I have provided informed consent.  Pre-Procedure Preparation: Safety Precautions: Allergies reviewed. Appropriate site, procedure, and patient were confirmed by following the Joint Commission's Universal Protocol (UP.01.01.01), in the form of a "Time Out". The patient was asked to confirm marked site and procedure, before commencing. The patient was asked about blood thinners, or active infections, both of which were denied. Patient was assessed for positional comfort and all pressure points were checked before starting procedure. Allergies: He has No Known Allergies.. Infection Control Precautions: Sterile technique used. Standard Universal Precautions were taken as recommended by the Department of [facility] for Disease Control and Prevention (CDC). Standard pre-surgical skin prep was conducted. Respiratory hygiene and cough etiquette was practiced. Hand hygiene observed. Safe injection practices and needle disposal techniques followed. SDV (single dose vial) medications used. Medications properly checked for expiration dates and contaminants. Personal protective equipment (PPE) used: Sterile Radiation-resistant gloves. Monitoring:  As per clinic protocol. Vitals:   01/05/16 1422 01/05/16 1432 01/05/16 1442 01/05/16 1452  BP: 122/77 124/80 116/82 129/81  Pulse: 74 67 70 70  Resp: '12 12 12 12  ' Temp: 97.8 F (36.6 C)     TempSrc: Temporal      SpO2: 95% 95% 95% 96%  Weight:      Height:      Calculated BMI: Body mass index is 29.86 kg/m.  Description of Procedure #1 Process:   Time-out: "Time-out" completed before starting procedure, as per protocol. Position: Prone Target Area: For Lumbar Facet blocks, the target is the groove formed by the junction of the transverse process and superior articular process. For the L5 dorsal ramus, the target is the notch  between superior articular process and sacral ala. For the S1 dorsal ramus, the target is the superior and lateral edge of the posterior S1 Sacral foramen. Approach: Paramedial approach. Area Prepped: Entire Posterior Lumbosacral Region Prepping solution: ChloraPrep (2% chlorhexidine gluconate and 70% isopropyl alcohol) Safety Precautions: Aspiration looking for blood return was conducted prior to all injections. At no point did we inject any substances, as a needle was being advanced. No attempts were made at seeking any paresthesias. Safe injection practices and needle disposal techniques used. Medications properly checked for expiration dates. SDV (single dose vial) medications used.     Description of the Procedure: Protocol guidelines were followed. The patient was placed in position over the fluoroscopy table. The target area was identified and the area prepped in the usual manner. Skin desensitized using vapocoolant spray. Skin & deeper tissues infiltrated with local anesthetic. Appropriate amount of time allowed to pass for local anesthetics to take effect. The procedure needle was introduced through the skin, ipsilateral to the reported pain, and advanced to the target area. Employing the "Medial Branch Technique", the needles were advanced to the angle made by the superior and medial portion of the transverse process, and the lateral and inferior portion of the superior articulating process of the targeted vertebral bodies. This area is known as "[place]'s Eye" or the "Eye of  the [place] Dog". A procedure needle was introduced through the skin, and this time advanced to the angle made by the superior and medial border of the sacral ala, and the lateral border of the S1 vertebral body. This last needle was later repositioned at the superior and lateral border of the posterior S1 foramen. Negative aspiration confirmed. Solution injected in intermittent fashion, asking for systemic symptoms every 0.5cc of injectate. The needles were then removed and the area cleansed, making sure to leave some of the prepping solution back to take advantage of its long term bactericidal properties. Materials & Medications Used:  Needle(s) Used: 22g - 3.5" Spinal Needle(s)  Description of Procedure # 2 Process:   Time-out: "Time-out" completed before starting procedure, as per protocol. Position: Prone Target Area: For upper sacroiliac joint block(s), the target is the superior and posterior margin of the sacroiliac joint. Approach: Ipsilateral approach. Area Prepped: Entire Posterior Lumbosacral Region Prepping solution: Duraprep (Iodine Povacrylex [0.7% available Iodine] and Isopropyl Alcohol, 74% w/w) Safety Precautions: Aspiration looking for blood return was conducted prior to all injections. At no point did we inject any substances, as a needle was being advanced. No attempts were made at seeking any paresthesias. Safe injection practices and needle disposal techniques used. Medications properly checked for expiration dates. SDV (single dose vial) medications used. Description of the Procedure: Protocol guidelines were followed. The patient was placed in position over the fluoroscopy table. The target area was identified and the area prepped in the usual manner. Skin desensitized using vapocoolant spray. Skin & deeper tissues infiltrated with local anesthetic. Appropriate amount of time allowed to pass for local anesthetics to take effect. The procedure needle was advanced under  fluoroscopic guidance into the sacroiliac joint until a firm endpoint was obtained. Proper needle placement secured. Negative aspiration confirmed. Solution injected in intermittent fashion, asking for systemic symptoms every 0.5cc of injectate. The needles were then removed and the area cleansed, making sure to leave some of the prepping solution back to take advantage of its long term bactericidal properties. Materials & Medications Used:  Needle(s) Used: 22g - 3.5" Spinal Needle(s)  EBL: None Medications Administered today:  We administered ropivacaine (PF) 2 mg/ml (0.2%), methylPREDNISolone acetate, triamcinolone acetonide, midazolam, fentaNYL, and triamcinolone acetonide.Please see chart orders for dosing details.  Imaging Guidance:   Type of Imaging Technique: Fluoroscopy Guidance (Spinal) Indication(s): Assistance in needle guidance and placement for procedures requiring needle placement in or near specific anatomical locations not easily accessible without such assistance. Exposure Time: Please see nurses notes. Contrast: None required. Fluoroscopic Guidance: I was personally present in the fluoroscopy suite, where the patient was placed in position for the procedure, over the fluoroscopy-compatible table. Fluoroscopy was manipulated, using "Tunnel Vision Technique", to obtain the best possible view of the target area, on the affected side. Parallax error was corrected before commencing the procedure. A "direction-depth-direction" technique was used to introduce the needle under continuous pulsed fluoroscopic guidance. Once the target was reached, antero-posterior, oblique, and lateral fluoroscopic projection views were taken to confirm needle placement in all planes. Permanently recorded images stored by scanning into EMR. Interpretation: Intraoperative imaging interpretation by performing Physician. Adequate needle placement confirmed. Adequate needle placement confirmed in AP, lateral, &  Oblique Views. No contrast injected.  Antibiotic Prophylaxis:  Indication(s): No indications identified. Type:  Antibiotics Given (last 72 hours)    None       Post-operative Assessment:   Complications: No immediate post-treatment complications were observed. Disposition: Return to clinic for follow-up evaluation. The patient tolerated the entire procedure well. A repeat set of vitals were taken after the procedure and the patient was kept under observation following institutional policy, for this procedure. Post-procedural neurological assessment was performed, showing return to baseline, prior to discharge. The patient was discharged home, once institutional criteria were met. The patient was provided with post-procedure discharge instructions, including a section on how to identify potential problems. Should any problems arise concerning this procedure, the patient was given instructions to immediately contact us, at any time, without hesitation. In any case, we plan to contact the patient by telephone for a follow-up status report regarding this interventional procedure. Comments:  No additional relevant information.  Medications administered during this visit: We administered ropivacaine (PF) 2 mg/ml (0.2%), methylPREDNISolone acetate, triamcinolone acetonide, midazolam, fentaNYL, and triamcinolone acetonide.  Prescriptions ordered during this visit: New Prescriptions   No medications on file    Future Appointments Date Time Provider [place]  01/26/2016 10:00 AM Milinda Pointer, MD [facility] None  03/19/2016 10:20 AM Milinda Pointer, MD [facility] None  06/19/2016 11:00 AM Nori Riis, PA-C [facility] None    Primary Care Physician: Enid Derry, MD Location: [facility] Note by: Kathlen Brunswick. Dossie Arbour, M.D, DABA, DABAPM, DABPM, DABIPP, FIPP  Disclaimer:  Medicine is not an exact science. The only guarantee in medicine is that nothing is  guaranteed. It is important to note that the decision to proceed with this intervention was based on the information collected from the patient. The Data and conclusions were drawn from the patient's questionnaire, the interview, and the physical examination. Because the information was provided in large part by the patient, it cannot be guaranteed that it has not been purposely or unconsciously manipulated. Every effort has been made to obtain as much relevant data as possible for this evaluation. It is important to note that the conclusions that lead to this procedure are derived in large part from the available data. Always take into account that the treatment will also be dependent on availability of resources and existing treatment guidelines, considered by other Pain Management Practitioners as being common knowledge and practice, at the  time of the intervention. For Medico-Legal purposes, it is also important to point out that variation in procedural techniques and pharmacological choices are the acceptable norm. The indications, contraindications, technique, and results of the above procedure should only be interpreted and judged by a Board-Certified Interventional Pain Specialist with extensive familiarity and expertise in the same exact procedure and technique. Attempts at providing opinions without similar or greater experience and expertise than that of the treating physician will be considered as inappropriate and unethical, and shall result in a formal complaint to the state medical board and applicable specialty societies.

## 2016-01-06 ENCOUNTER — Telehealth: Payer: Self-pay

## 2016-01-06 NOTE — Telephone Encounter (Signed)
Post procedure phone call.  Patient states he is doing good today.  

## 2016-01-11 DIAGNOSIS — I10 Essential (primary) hypertension: Secondary | ICD-10-CM | POA: Diagnosis not present

## 2016-01-11 DIAGNOSIS — M4316 Spondylolisthesis, lumbar region: Secondary | ICD-10-CM | POA: Diagnosis not present

## 2016-01-11 DIAGNOSIS — Z6829 Body mass index (BMI) 29.0-29.9, adult: Secondary | ICD-10-CM | POA: Diagnosis not present

## 2016-01-11 DIAGNOSIS — M7138 Other bursal cyst, other site: Secondary | ICD-10-CM | POA: Diagnosis not present

## 2016-01-16 ENCOUNTER — Other Ambulatory Visit: Payer: Self-pay | Admitting: Neurosurgery

## 2016-01-17 ENCOUNTER — Encounter: Payer: Self-pay | Admitting: Emergency Medicine

## 2016-01-17 DIAGNOSIS — E119 Type 2 diabetes mellitus without complications: Secondary | ICD-10-CM | POA: Diagnosis not present

## 2016-01-17 DIAGNOSIS — Z79899 Other long term (current) drug therapy: Secondary | ICD-10-CM | POA: Insufficient documentation

## 2016-01-17 DIAGNOSIS — R109 Unspecified abdominal pain: Secondary | ICD-10-CM | POA: Diagnosis not present

## 2016-01-17 DIAGNOSIS — N200 Calculus of kidney: Secondary | ICD-10-CM | POA: Diagnosis not present

## 2016-01-17 DIAGNOSIS — I10 Essential (primary) hypertension: Secondary | ICD-10-CM | POA: Insufficient documentation

## 2016-01-17 DIAGNOSIS — K760 Fatty (change of) liver, not elsewhere classified: Secondary | ICD-10-CM | POA: Diagnosis not present

## 2016-01-17 LAB — URINALYSIS COMPLETE WITH MICROSCOPIC (ARMC ONLY)
BACTERIA UA: NONE SEEN
Bilirubin Urine: NEGATIVE
Glucose, UA: NEGATIVE mg/dL
Ketones, ur: NEGATIVE mg/dL
LEUKOCYTES UA: NEGATIVE
Nitrite: NEGATIVE
PH: 6 (ref 5.0–8.0)
PROTEIN: 30 mg/dL — AB
SQUAMOUS EPITHELIAL / LPF: NONE SEEN
Specific Gravity, Urine: 1.012 (ref 1.005–1.030)

## 2016-01-17 LAB — BASIC METABOLIC PANEL
Anion gap: 6 (ref 5–15)
BUN: 14 mg/dL (ref 6–20)
CALCIUM: 9 mg/dL (ref 8.9–10.3)
CHLORIDE: 107 mmol/L (ref 101–111)
CO2: 26 mmol/L (ref 22–32)
Creatinine, Ser: 1.05 mg/dL (ref 0.61–1.24)
GFR calc Af Amer: >60 mL/min (ref >60)
GLUCOSE: 98 mg/dL (ref 65–99)
POTASSIUM: 4.1 mmol/L (ref 3.5–5.1)
SODIUM: 139 mmol/L (ref 135–145)

## 2016-01-17 LAB — CBC
HEMATOCRIT: 52.1 % — AB (ref 40.0–52.0)
HEMOGLOBIN: 17.7 g/dL (ref 13.0–18.0)
MCH: 30.2 pg (ref 26.0–34.0)
MCHC: 33.9 g/dL (ref 32.0–36.0)
MCV: 89.1 fL (ref 80.0–100.0)
Platelets: 189 10*3/uL (ref 150–440)
RBC: 5.85 MIL/uL (ref 4.40–5.90)
RDW: 14.6 % — AB (ref 11.5–14.5)
WBC: 12 10*3/uL — AB (ref 3.8–10.6)

## 2016-01-17 NOTE — ED Triage Notes (Signed)
C/O right low back, right flank and RLQ pain x 2 days.  Pain with urination.  Patient has history of kidney stones and symptoms are similar to prior episodes.

## 2016-01-18 ENCOUNTER — Emergency Department: Payer: Medicare Other

## 2016-01-18 ENCOUNTER — Emergency Department
Admission: EM | Admit: 2016-01-18 | Discharge: 2016-01-18 | Disposition: A | Discharge status: Home / Self Care | Payer: Medicare Other | Attending: Emergency Medicine | Admitting: Emergency Medicine

## 2016-01-18 DIAGNOSIS — N200 Calculus of kidney: Secondary | ICD-10-CM

## 2016-01-18 DIAGNOSIS — R109 Unspecified abdominal pain: Secondary | ICD-10-CM

## 2016-01-18 DIAGNOSIS — K760 Fatty (change of) liver, not elsewhere classified: Secondary | ICD-10-CM | POA: Diagnosis not present

## 2016-01-18 MED ORDER — ONDANSETRON HCL 4 MG/2ML IJ SOLN
INTRAMUSCULAR | Status: AC
  Administered 2016-01-18: 4 mg via INTRAVENOUS
  Filled 2016-01-18: qty 2

## 2016-01-18 MED ORDER — OXYCODONE-ACETAMINOPHEN 5-325 MG PO TABS
ORAL_TABLET | ORAL | Status: AC
  Filled 2016-01-18: qty 2

## 2016-01-18 MED ORDER — ONDANSETRON HCL 4 MG/2ML IJ SOLN
4.0000 mg | Freq: Once | INTRAMUSCULAR | Status: AC
  Administered 2016-01-18: 4 mg via INTRAVENOUS

## 2016-01-18 MED ORDER — ONDANSETRON HCL 4 MG/2ML IJ SOLN
INTRAMUSCULAR | Status: AC
  Administered 2016-01-18: 01:00:00
  Filled 2016-01-18: qty 2

## 2016-01-18 MED ORDER — OXYCODONE-ACETAMINOPHEN 5-325 MG PO TABS: 1.0000 | ORAL_TABLET | ORAL | 0 refills | Status: DC | PRN

## 2016-01-18 MED ORDER — KETOROLAC TROMETHAMINE 30 MG/ML IJ SOLN
15.0000 mg | Freq: Once | INTRAMUSCULAR | Status: AC
  Administered 2016-01-18: 15 mg via INTRAVENOUS
  Filled 2016-01-18: qty 1

## 2016-01-18 MED ORDER — OXYCODONE-ACETAMINOPHEN 5-325 MG PO TABS
2.0000 | ORAL_TABLET | Freq: Once | ORAL | Status: AC
  Administered 2016-01-18: 2 via ORAL
  Filled 2016-01-18: qty 2

## 2016-01-18 MED ORDER — MORPHINE SULFATE (PF) 4 MG/ML IV SOLN
INTRAVENOUS | Status: AC
  Administered 2016-01-18: 01:00:00
  Filled 2016-01-18: qty 1

## 2016-01-18 MED ORDER — TAMSULOSIN HCL 0.4 MG PO CAPS
ORAL_CAPSULE | ORAL | 0 refills | Status: DC
Start: 2016-01-18 — End: 2016-03-15

## 2016-01-18 MED ORDER — ONDANSETRON 4 MG PO TBDP: ORAL_TABLET | ORAL | 0 refills | Status: DC

## 2016-01-18 NOTE — ED Provider Notes (Signed)
[facility] Emergency Department Provider Note  ____________________________________________   First MD Initiated Contact with Patient 01/18/16 [phone]     (approximate)  I have reviewed the triage vital signs and the nursing notes.   HISTORY  Chief Complaint Flank Pain    HPI Howard Murphy is a 60 y.o. male with a past medical history reviewed and includes multiple prior episodes of kidney stones who presents with right flank pain for about 3 days.  He reports this started acutely and that he has been struggling with it since then.  Movement makes it worse and nothing makes it better.  The pain became very severe tonight with associated nausea but no vomiting.  He describes it as sharp and stabbing.  It feels like prior kidney stones.  He has seen a small amount of blood in his urine.  He denies fever/chills, chest pain, shortness of breath, vomiting, any other abdominal pain, diarrhea.  He has not had any recent traumatic injuries.   Past Medical History:  Diagnosis Date  . 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  . Back ache 05/05/2013  . Diabetes mellitus without complication ([place])   . GERD (gastroesophageal reflux disease)   . Hyperlipemia   . Hypertension   . Nephrolithiasis   . Shortness of breath dyspnea     Patient Active Problem List   Diagnosis Date Noted  . Cervical spondylosis 12/17/2015  . Cervical facet arthropathy 12/17/2015  . Chronic neck pain (Bilateral) (R>L) 12/17/2015  . Cervical facet syndrome (Bilateral) (R>L) 12/17/2015  . Chronic upper extremity pain (Bilateral) (R>L) 12/17/2015  . Cervical foraminal stenosis 12/17/2015  .  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 Arthropathy of shoulder (Right) 12/17/2015  . 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 Lumbar foraminal stenosis (L4-5) (Left) 12/17/2015  . Grade 1 Anterolisthesis  (2 mm) of L4 over L5. 12/17/2015  . Lumbar facet (L4-5) synovial cyst (6 mm) (Left) 12/17/2015  . Chronic sacroiliac joint pain (Bilateral) (L>R) 12/08/2015  . Neurogenic pain 12/08/2015  . Abnormal MRI, lumbar spine 11/21/2015  . Radicular pain of shoulder (Right) 10/03/2015  . Carpal tunnel syndrome (Bilateral) 10/03/2015  . Chronic cervical radicular pain (Right) 10/03/2015  . Diuretic-induced hypokalemia 09/06/2015  . Chronic low back pain with left-sided sciatica 09/06/2015  . Chronic pain 09/05/2015  . Opiate use (60 MME/Day) 09/05/2015  . Long term prescription opiate use 09/05/2015  . Long term current use of opiate analgesic 09/05/2015  . Chronic low back pain (Bilateral) (L>R) 09/05/2015  . Lumbar spondylosis 09/05/2015  . Lumbar facet syndrome (Bilateral) (L>R) 09/05/2015  . Chronic hip pain (Left) 09/05/2015  . Diabetic peripheral neuropathy ([place]) 09/05/2015  . Chronic lumbar radicular pain (S1 Dermatome) (Bilateral) (L>R) 09/05/2015  . Hypokalemia 09/05/2015  . Chronic shoulder pain (Location of Primary Source of Pain) (Bilateral) (L>R) 09/05/2015  . Non-insulin dependent type 2 diabetes mellitus ([place]) 09/05/2015  . Chronic foot pain (Location of Secondary source of pain) (Bilateral) (L>R) 09/05/2015  . Chronic hand pain (Location of Tertiary source of pain) (Bilateral) (R>L) 09/05/2015  . Lumbar facet hypertrophy 09/05/2015  . Encounter for therapeutic drug level monitoring 09/05/2015  . Encounter for pain management planning 09/05/2015  . Low testosterone 09/05/2015  . Occipital pain (Right) 09/05/2015  . 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 Hypertriglyceridemia 07/26/2015  . Dyspnea 07/14/2015  . Chronic abdominal pain (RUQ) 07/14/2015  .  Type 2 diabetes mellitus with peripheral neuropathy ([place]) 07/14/2015  . Chest pain 07/14/2015  . Complete tear of the shoulder rotator cuff (Left) 05/10/2015  . Breath shortness 03/22/2015  . Elevated hemoglobin ([place])  07/07/2013  . History of lumbar facet Synovial cyst, surgically removed. 05/29/2013    Class: History of  . 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 Acid reflux 05/05/2013  . HLD (hyperlipidemia) 05/05/2013  . BP (high blood pressure) 05/08/2011  . Hypercholesteremia 05/08/2011  . Arthritis or polyarthritis, rheumatoid ([place]) 05/08/2011  . Rheumatoid arthritis ([place]) 05/08/2011    Past Surgical History:  Procedure Laterality Date  . BACK SURGERY  2015  . CHOLECYSTECTOMY  2000  . COLONOSCOPY WITH PROPOFOL N/A 04/11/2015   Procedure: COLONOSCOPY WITH PROPOFOL;  Surgeon: Manya Silvas, MD;  Location: [facility] ENDOSCOPY;  Service: Endoscopy;  Laterality: N/A;  . ESOPHAGOGASTRODUODENOSCOPY (EGD) WITH PROPOFOL N/A 04/11/2015   Procedure: ESOPHAGOGASTRODUODENOSCOPY (EGD) WITH PROPOFOL;  Surgeon: Manya Silvas, MD;  Location[facility] ENDOSCOPY;  Service: Endoscopy;  Laterality: N/A;  . SHOULDER SURGERY Right 2016    Prior to Admission medications   Medication Sig Start Date End Date Taking? Authorizing Provider  amLODipine (NORVASC) 5 MG tablet Take 1 tablet (5 mg total) by mouth daily. 09/06/15   Roselee Nova, MD  chlorthalidone (HYGROTON) 50 MG tablet 50 mg daily.  09/30/15   Historical Provider, MD  Cholecalciferol (VITAMIN D3) 5000 units TBDP Take by mouth daily.    Historical Provider, MD  gabapentin (NEURONTIN) 100 MG capsule Take 1 capsule (100 mg total) by mouth every 8 (eight) hours. Follow titration schedule. 12/08/15   Milinda Pointer, MD  Naloxone HCl (NARCAN) 4 MG/0.1ML LIQD Spray into one nostril. Repeat with second device into other nostril after 2-3 minutes if no or minimal response. 10/24/15   Milinda Pointer, MD  omeprazole (PRILOSEC) 40 MG capsule Take 40 mg by mouth 2 (two) times daily. 02/07/15   Historical Provider, MD  ondansetron (ZOFRAN ODT) 4 MG disintegrating tablet Allow 1-2 tablets to dissolve in your mouth every 8 hours as needed for nausea/vomiting 01/18/16   Hinda Kehr, MD  Oxycodone HCl 10 MG TABS Take 1 tablet (10 mg total) by mouth every 6 (six) hours as needed. 12/08/15   Milinda Pointer, MD  oxyCODONE-acetaminophen (ROXICET) 5-325 MG tablet Take 1-2 tablets by mouth every 4 (four) hours as needed for severe pain. 01/18/16   Hinda Kehr, MD  rosuvastatin (CRESTOR) 20 MG tablet 20 mg daily.  09/30/15   Historical Provider, MD  sitaGLIPtin (JANUVIA) 50 MG tablet Take 1 tablet (50 mg total) by mouth daily. 07/14/15   Roselee Nova, MD  sucralfate (CARAFATE) 1 G tablet Take 1 tablet by mouth 4 (four) times daily. 03/15/15   Historical Provider, MD  tamsulosin (FLOMAX) 0.4 MG CAPS capsule Take 1 tablet by mouth daily until you pass the kidney stone or no longer have symptoms 01/18/16   Hinda Kehr, MD  testosterone cypionate (DEPOTESTOSTERONE CYPIONATE) 200 MG/ML injection Inject 200 mg into the muscle every 14 (fourteen) days. 05/12/12   Historical Provider, MD    Allergies Review of patient's allergies indicates no known allergies.  Family History  Problem Relation Age of Onset  . Cancer Mother     Breast CA  . Stroke Father   . Heart disease Father   . Cancer Sister   . Cancer Maternal Grandmother   . Cancer Sister   . Bladder Cancer Neg Hx   .  Prostate cancer Neg Hx   . Kidney cancer Neg Hx     Social History Social History  Substance Use Topics  . Smoking status: Never Smoker  . Smokeless tobacco: Never Used  . Alcohol use No     Comment: occasional    Review of Systems Constitutional: No fever/chills Eyes: No visual changes. ENT: No sore throat. Cardiovascular: Denies chest pain. Respiratory: Denies shortness of breath. Gastrointestinal: Right flank pain, no abd pain.  nausea, no vomiting.  No diarrhea.  No constipation. Genitourinary: Negative for dysuria. Minimal hematuria Musculoskeletal: Negative for back pain. Skin: Negative for rash. Neurological: Negative for headaches, focal weakness or numbness.  10-point ROS  otherwise negative.  ____________________________________________   PHYSICAL EXAM:  VITAL SIGNS: ED Triage Vitals  Enc Vitals Group     BP 01/17/16 2116 (!) 162/68     Pulse Rate 01/17/16 2116 66     Resp 01/17/16 2116 18     Temp 01/17/16 2116 97.7 F (36.5 C)     Temp Source 01/17/16 2116 Oral     SpO2 01/17/16 2116 97 %     Weight 01/17/16 2116 185 lb (83.9 kg)     Height 01/17/16 2116 5\' 6"  (1.676 m)     Head Circumference --      Peak Flow --      Pain Score 01/17/16 2115 10     Pain Loc --      Pain Edu? --      Excl. in [place]? --     Constitutional: Alert and oriented. Well appearing and in no acute distress. Eyes: Conjunctivae are normal. PERRL. EOMI. Head: Atraumatic. Nose: No congestion/rhinnorhea. Mouth/Throat: Mucous membranes are moist.  Oropharynx non-erythematous. Neck: No stridor.  No meningeal signs.   Cardiovascular: Normal rate, regular rhythm. Good peripheral circulation. Grossly normal heart sounds.   Respiratory: Normal respiratory effort.  No retractions. Lungs CTAB. Gastrointestinal: Soft and nontender. No distention.  Musculoskeletal: No lower extremity tenderness nor edema. No gross deformities of extremities.  Right CVA tenderness Neurologic:  Normal speech and language. No gross focal neurologic deficits are appreciated.  Skin:  Skin is warm, dry and intact. No rash noted. Psychiatric: Mood and affect are normal. Speech and behavior are normal.  ____________________________________________   LABS (all labs ordered are listed, but only abnormal results are displayed)  Labs Reviewed  URINALYSIS COMPLETEWITH MICROSCOPIC ([place]) - Abnormal; Notable for the following:       Result Value   Color, Urine YELLOW (*)    APPearance CLEAR (*)    Hgb urine dipstick 3+ (*)    Protein, ur 30 (*)    All other components within normal limits  CBC - Abnormal; Notable for the following:    WBC 12.0 (*)    HCT 52.1 (*)    RDW 14.6 (*)    All other  components within normal limits  BASIC METABOLIC PANEL   ____________________________________________  EKG  None ____________________________________________  RADIOLOGY  I reviewed the radiologist's comments placed strictly on the CT image; the written report was not available due to emergent computer downtime.  The radiologist noted layering stones in the bladder and some mild hydronephrosis and said that the results were consistent with a recently passed ureter stone on the right.  ____________________________________________   PROCEDURES  Procedure(s) performed:   Procedures   Critical Care performed: No ____________________________________________   INITIAL IMPRESSION / ASSESSMENT AND PLAN / ED COURSE  Pertinent labs & imaging results that were available  during my care of the patient were reviewed by me and considered in my medical decision making (see chart for details).  The patient states that after his CT scan was obtained he urinated again and was able to feel and hear the past several stones.  This is consistent with finding them in the bladder on CT scan.  He is still expressing some pain and some mild nausea due to the recently passed stones all provide the usual medications.  I gave him my usual customary return precautions.  There is no indication for antibiotics at this time.    ____________________________________________  FINAL CLINICAL IMPRESSION(S) / ED DIAGNOSES  Final diagnoses:  Right flank pain  Kidney stone     MEDICATIONS GIVEN DURING THIS VISIT:  Medications  ondansetron (ZOFRAN) 4 MG/2ML injection (  Given 01/18/16 0035)  morphine 4 MG/ML injection (  Given 01/18/16 0035)  ketorolac (TORADOL) 30 MG/ML injection 15 mg (15 mg Intravenous Given 01/18/16 0218)  oxyCODONE-acetaminophen (PERCOCET/ROXICET) 5-325 MG per tablet 2 tablet (2 tablets Oral Given 01/18/16 0218)  ondansetron (ZOFRAN) injection 4 mg (4 mg Intravenous Given 01/18/16 0224)      NEW OUTPATIENT MEDICATIONS STARTED DURING THIS VISIT:  New Prescriptions   ONDANSETRON (ZOFRAN ODT) 4 MG DISINTEGRATING TABLET    Allow 1-2 tablets to dissolve in your mouth every 8 hours as needed for nausea/vomiting   OXYCODONE-ACETAMINOPHEN (ROXICET) 5-325 MG TABLET    Take 1-2 tablets by mouth every 4 (four) hours as needed for severe pain.   TAMSULOSIN (FLOMAX) 0.4 MG CAPS CAPSULE    Take 1 tablet by mouth daily until you pass the kidney stone or no longer have symptoms      Note:  This document was prepared using Dragon voice recognition software and may include unintentional dictation errors.    Hinda Kehr, MD 01/18/16 [phone]

## 2016-01-18 NOTE — Discharge Instructions (Signed)
You have been seen in the Emergency Department (ED) today for pain that we believe based on your workup, is caused by kidney stones.  As we have discussed, please drink plenty of fluids.  Please make a follow up appointment with the physician(s) listed elsewhere in this documentation. ° °You may take pain medication as needed but ONLY as prescribed.  Please also take your prescribed Flomax daily.  We also recommend that you take over-the-counter ibuprofen regularly according to label instructions over the next 5 days.  Take it with meals to minimize stomach discomfort. ° °Please see your doctor as soon as possible as stones may take 1-3 weeks to pass and you may require additional care or medications. ° °Do not drink alcohol, drive or participate in any other potentially dangerous activities while taking opiate pain medication as it may make you sleepy. Do not take this medication with any other sedating medications, either prescription or over-the-counter. If you were prescribed Percocet or Vicodin, do not take these with acetaminophen (Tylenol) as it is already contained within these medications. °  °Take Percocet as needed for severe pain.  This medication is an opiate (or narcotic) pain medication and can be habit forming.  Use it as little as possible to achieve adequate pain control.  Do not use or use it with extreme caution if you have a history of opiate abuse or dependence.  If you are on a pain contract with your primary care doctor or a pain specialist, be sure to let them know you were prescribed this medication today from the  [facility].  This medication is intended for your use only - do not give any to anyone else and keep it in a secure place where nobody else, especially children, have access to it.  It will also cause or worsen constipation, so you may want to consider taking an over-the-counter stool softener while you are taking this medication. ° °Return to the  Emergency Department (ED) or call your doctor if you have any worsening pain, fever, painful urination, are unable to urinate, or develop other symptoms that concern you. ° °

## 2016-01-18 NOTE — ED Notes (Signed)
Patient c/o continued pain, asking MD for orders.

## 2016-01-18 NOTE — ED Notes (Signed)
MD at bedside. 

## 2016-01-19 ENCOUNTER — Encounter (HOSPITAL_COMMUNITY): Payer: Self-pay

## 2016-01-19 ENCOUNTER — Encounter (HOSPITAL_COMMUNITY)
Admission: RE | Admit: 2016-01-19 | Discharge: 2016-01-19 | Disposition: A | Discharge status: Home / Self Care | Payer: Medicare Other | Source: Ambulatory Visit | Attending: Neurosurgery | Admitting: Neurosurgery

## 2016-01-19 DIAGNOSIS — M4316 Spondylolisthesis, lumbar region: Secondary | ICD-10-CM | POA: Diagnosis not present

## 2016-01-19 DIAGNOSIS — Z7984 Long term (current) use of oral hypoglycemic drugs: Secondary | ICD-10-CM | POA: Diagnosis not present

## 2016-01-19 DIAGNOSIS — E119 Type 2 diabetes mellitus without complications: Secondary | ICD-10-CM | POA: Insufficient documentation

## 2016-01-19 DIAGNOSIS — I10 Essential (primary) hypertension: Secondary | ICD-10-CM | POA: Insufficient documentation

## 2016-01-19 DIAGNOSIS — Z01812 Encounter for preprocedural laboratory examination: Secondary | ICD-10-CM | POA: Diagnosis not present

## 2016-01-19 DIAGNOSIS — Z87891 Personal history of nicotine dependence: Secondary | ICD-10-CM | POA: Insufficient documentation

## 2016-01-19 DIAGNOSIS — Z0183 Encounter for blood typing: Secondary | ICD-10-CM | POA: Diagnosis not present

## 2016-01-19 DIAGNOSIS — E785 Hyperlipidemia, unspecified: Secondary | ICD-10-CM | POA: Insufficient documentation

## 2016-01-19 DIAGNOSIS — K219 Gastro-esophageal reflux disease without esophagitis: Secondary | ICD-10-CM | POA: Insufficient documentation

## 2016-01-19 DIAGNOSIS — Z79899 Other long term (current) drug therapy: Secondary | ICD-10-CM | POA: Insufficient documentation

## 2016-01-19 HISTORY — DX: Calculus of kidney: N20.0

## 2016-01-19 LAB — GLUCOSE, CAPILLARY: Glucose-Capillary: 150 mg/dL — ABNORMAL HIGH (ref 65–99)

## 2016-01-19 LAB — ABO/RH: ABO/RH(D): A POS

## 2016-01-19 LAB — SURGICAL PCR SCREEN
MRSA, PCR: NEGATIVE
STAPHYLOCOCCUS AUREUS: NEGATIVE

## 2016-01-19 LAB — TYPE AND SCREEN
ABO/RH(D): A POS
Antibody Screen: NEGATIVE

## 2016-01-19 NOTE — Pre-Procedure Instructions (Signed)
Howard Murphy  01/19/2016      [facility] [place] [place] [address] [place] [address] Phone: [phone] Fax: [phone]  [facility] [address], [place] [place] [address] Phone: [phone] Fax: [phone]    Your procedure is scheduled on Aug. 15  Report to [place] at 5:30 A.M.  Call this number if you have problems the morning of surgery:  [phone]   Remember:  Do not eat food or drink liquids after midnight on aug. 14   Take these medicines the morning of surgery with A SIP OF WATER : amlodipine (norvasc), gabapentin (neurontin),  Omeprazole (prilosec), oxycodone if needed, carafate, zofran if needed              Stop NSAIDS: advil, motrin, ibuprofen, aleve, BC Powders, Goody's .     How to Manage Your Diabetes Before and After Surgery  Why is it important to control my blood sugar before and after surgery? . Improving blood sugar levels before and after surgery helps healing and can limit problems. . A way of improving blood sugar control is eating a healthy diet by: o  Eating less sugar and carbohydrates o  Increasing activity/exercise o  Talking with your doctor about reaching your blood sugar goals . High blood sugars (greater than 180 mg/dL) can raise your risk of infections and slow your recovery, so you will need to focus on controlling your diabetes during the weeks before surgery. . Make sure that the doctor who takes care of your diabetes knows about your planned surgery including the date and location.  How do I manage my blood sugar before surgery? . Check your blood sugar at least 4 times a day, starting 2 days before surgery, to make sure that the level is not too high or low. o Check your blood sugar the morning of your surgery when you wake up and every 2 hours until you get to the Short Stay  unit. . If your blood sugar is less than 70 mg/dL, you will need to treat for low blood sugar: o Do not take insulin. o Treat a low blood sugar (less than 70 mg/dL) with  cup of clear juice (cranberry or apple), 4 glucose tablets, OR glucose gel. o Recheck blood sugar in 15 minutes after treatment (to make sure it is greater than 70 mg/dL). If your blood sugar is not greater than 70 mg/dL on recheck, call [phone] for further instructions. . Report your blood sugar to the short stay nurse when you get to Short Stay.  . If you are admitted to the hospital after surgery: o Your blood sugar will be checked by the staff and you will probably be given insulin after surgery (instead of oral diabetes medicines) to make sure you have good blood sugar levels. o The goal for blood sugar control after surgery is 80-180 mg/dL.      WHAT DO I DO ABOUT MY DIABETES MEDICATION?   Marland Kitchen Do not take oral diabetes medicines (pills) the morning of surgery.     Do not wear jewelry, make-up or nail polish.  Do not wear lotions, powders, or perfumes.  You may wear deoderant.  Do not shave 48 hours prior to surgery.  Men may shave face and neck.  Do not bring valuables to the hospital.  [facility] is not responsible for any belongings or valuables.  Contacts, dentures or bridgework may not be worn into surgery.  Leave your suitcase in the car.  After surgery it may be brought to your room.  For patients admitted to the hospital, discharge time will be determined by your treatment team.  Patients discharged the day of surgery will not be allowed to drive home.    Special instructions:  Review preparing for surgery  Please read over the following fact sheets that you were given. Coughing and Deep Breathing and MRSA Information

## 2016-01-19 NOTE — Progress Notes (Signed)
PCP: [facility] in [facility] Doesn't see a cardiologist. Prior to a previous surgery @ [facility] had a echo/stress test . Stated it was done [facility] 2 yrs. Ago-will request studies  States he hasn't checked blood sugars  In a month.but when he dis fasting was around 137.  States he been passing kidney stones, went to ED @ [facility]. Stated if he had any more problems to refer to PCP. Stated he thinks he's finished pasing them. I stated for him to call Dr. Genevive Bi office and let him know. He said he will.

## 2016-01-20 LAB — HEMOGLOBIN A1C
HEMOGLOBIN A1C: 6.3 % — AB (ref 4.8–5.6)
MEAN PLASMA GLUCOSE: 134 mg/dL

## 2016-01-20 NOTE — Progress Notes (Signed)
Followed up on echo/ekg request.   Request has not been faxed yet.  Asked for it to be faxed over to [facility] clinic in the cardiology department

## 2016-01-23 ENCOUNTER — Other Ambulatory Visit: Payer: Self-pay | Admitting: Family Medicine

## 2016-01-23 DIAGNOSIS — E1142 Type 2 diabetes mellitus with diabetic polyneuropathy: Secondary | ICD-10-CM

## 2016-01-23 MED ORDER — CEFAZOLIN SODIUM-DEXTROSE 2-4 GM/100ML-% IV SOLN
2.0000 g | INTRAVENOUS | Status: AC
Start: 2016-01-24 — End: 2016-01-24
  Administered 2016-01-24: 2 g via INTRAVENOUS
  Filled 2016-01-23: qty 100

## 2016-01-23 NOTE — Progress Notes (Signed)
Left message at [facility] for Echo and stress test results be faxed ASAP.   Several attempts have been made for requested records.

## 2016-01-24 ENCOUNTER — Inpatient Hospital Stay (HOSPITAL_COMMUNITY): Payer: Medicare Other | Admitting: Certified Registered Nurse Anesthetist

## 2016-01-24 ENCOUNTER — Encounter (HOSPITAL_COMMUNITY)
Admission: RE | Disposition: A | Discharge status: Home / Self Care | Payer: Self-pay | Source: Ambulatory Visit | Attending: Neurosurgery

## 2016-01-24 ENCOUNTER — Inpatient Hospital Stay (HOSPITAL_COMMUNITY): Payer: Medicare Other

## 2016-01-24 ENCOUNTER — Inpatient Hospital Stay (HOSPITAL_COMMUNITY)
Admission: RE | Admit: 2016-01-24 | Discharge: 2016-01-25 | DRG: 460 | Disposition: A | Discharge status: Home / Self Care | Payer: Medicare Other | Source: Ambulatory Visit | Attending: Neurosurgery | Admitting: Neurosurgery

## 2016-01-24 ENCOUNTER — Encounter (HOSPITAL_COMMUNITY): Payer: Self-pay | Admitting: Certified Registered Nurse Anesthetist

## 2016-01-24 DIAGNOSIS — G8929 Other chronic pain: Secondary | ICD-10-CM

## 2016-01-24 DIAGNOSIS — I10 Essential (primary) hypertension: Secondary | ICD-10-CM | POA: Diagnosis present

## 2016-01-24 DIAGNOSIS — M533 Sacrococcygeal disorders, not elsewhere classified: Secondary | ICD-10-CM

## 2016-01-24 DIAGNOSIS — Z87891 Personal history of nicotine dependence: Secondary | ICD-10-CM | POA: Diagnosis not present

## 2016-01-24 DIAGNOSIS — M4316 Spondylolisthesis, lumbar region: Secondary | ICD-10-CM | POA: Diagnosis present

## 2016-01-24 DIAGNOSIS — Z7984 Long term (current) use of oral hypoglycemic drugs: Secondary | ICD-10-CM

## 2016-01-24 DIAGNOSIS — E785 Hyperlipidemia, unspecified: Secondary | ICD-10-CM | POA: Diagnosis present

## 2016-01-24 DIAGNOSIS — M47816 Spondylosis without myelopathy or radiculopathy, lumbar region: Secondary | ICD-10-CM

## 2016-01-24 DIAGNOSIS — M7138 Other bursal cyst, other site: Secondary | ICD-10-CM | POA: Diagnosis present

## 2016-01-24 DIAGNOSIS — M549 Dorsalgia, unspecified: Secondary | ICD-10-CM | POA: Diagnosis not present

## 2016-01-24 DIAGNOSIS — Z419 Encounter for procedure for purposes other than remedying health state, unspecified: Secondary | ICD-10-CM

## 2016-01-24 DIAGNOSIS — Z981 Arthrodesis status: Secondary | ICD-10-CM | POA: Diagnosis not present

## 2016-01-24 DIAGNOSIS — E119 Type 2 diabetes mellitus without complications: Secondary | ICD-10-CM | POA: Diagnosis present

## 2016-01-24 DIAGNOSIS — K219 Gastro-esophageal reflux disease without esophagitis: Secondary | ICD-10-CM | POA: Diagnosis present

## 2016-01-24 LAB — GLUCOSE, CAPILLARY
GLUCOSE-CAPILLARY: 105 mg/dL — AB (ref 65–99)
GLUCOSE-CAPILLARY: 118 mg/dL — AB (ref 65–99)
GLUCOSE-CAPILLARY: 87 mg/dL (ref 65–99)
Glucose-Capillary: 143 mg/dL — ABNORMAL HIGH (ref 65–99)
Glucose-Capillary: 98 mg/dL (ref 65–99)

## 2016-01-24 SURGERY — POSTERIOR LUMBAR FUSION 1 LEVEL
Anesthesia: General | Site: Neck

## 2016-01-24 MED ORDER — HEMOSTATIC AGENTS (NO CHARGE) OPTIME
TOPICAL | Status: DC | PRN
  Administered 2016-01-24: 1 via TOPICAL

## 2016-01-24 MED ORDER — BISACODYL 10 MG RE SUPP
10.0000 mg | Freq: Every day | RECTAL | Status: DC | PRN
Start: 2016-01-24 — End: 2016-01-25

## 2016-01-24 MED ORDER — KETOROLAC TROMETHAMINE 15 MG/ML IJ SOLN
15.0000 mg | Freq: Four times a day (QID) | INTRAMUSCULAR | Status: DC | PRN
  Administered 2016-01-25: 15 mg via INTRAVENOUS
  Filled 2016-01-24: qty 1

## 2016-01-24 MED ORDER — METHOCARBAMOL 500 MG PO TABS
ORAL_TABLET | ORAL | Status: AC
  Filled 2016-01-24: qty 1

## 2016-01-24 MED ORDER — CEFAZOLIN SODIUM-DEXTROSE 2-4 GM/100ML-% IV SOLN
2.0000 g | Freq: Three times a day (TID) | INTRAVENOUS | Status: AC
  Administered 2016-01-24 – 2016-01-25 (×2): 2 g via INTRAVENOUS
  Filled 2016-01-24 (×2): qty 100

## 2016-01-24 MED ORDER — 0.9 % SODIUM CHLORIDE (POUR BTL) OPTIME
TOPICAL | Status: DC | PRN
  Administered 2016-01-24: 1000 mL

## 2016-01-24 MED ORDER — ROSUVASTATIN CALCIUM 20 MG PO TABS
20.0000 mg | ORAL_TABLET | Freq: Every day | ORAL | Status: DC
  Administered 2016-01-24: 20 mg via ORAL
  Filled 2016-01-24: qty 1

## 2016-01-24 MED ORDER — PROPOFOL 10 MG/ML IV BOLUS
INTRAVENOUS | Status: DC | PRN
  Administered 2016-01-24: 200 mg via INTRAVENOUS

## 2016-01-24 MED ORDER — ACETAMINOPHEN 10 MG/ML IV SOLN
INTRAVENOUS | Status: AC
  Filled 2016-01-24: qty 100

## 2016-01-24 MED ORDER — ACETAMINOPHEN 650 MG RE SUPP: 650.0000 mg | RECTAL | Status: DC | PRN

## 2016-01-24 MED ORDER — MIDAZOLAM HCL 5 MG/5ML IJ SOLN
INTRAMUSCULAR | Status: DC | PRN
  Administered 2016-01-24 (×2): 1 mg via INTRAVENOUS

## 2016-01-24 MED ORDER — MIDAZOLAM HCL 2 MG/2ML IJ SOLN
INTRAMUSCULAR | Status: AC
  Filled 2016-01-24: qty 2

## 2016-01-24 MED ORDER — PHENYLEPHRINE HCL 10 MG/ML IJ SOLN
INTRAVENOUS | Status: DC | PRN
  Administered 2016-01-24: 25 ug/min via INTRAVENOUS

## 2016-01-24 MED ORDER — FENTANYL CITRATE (PF) 100 MCG/2ML IJ SOLN: 25.0000 ug | INTRAMUSCULAR | Status: DC | PRN

## 2016-01-24 MED ORDER — SODIUM CHLORIDE 0.9% FLUSH: 3.0000 mL | INTRAVENOUS | Status: DC | PRN

## 2016-01-24 MED ORDER — ONDANSETRON HCL 4 MG/2ML IJ SOLN
INTRAMUSCULAR | Status: DC | PRN
  Administered 2016-01-24: 4 mg via INTRAVENOUS

## 2016-01-24 MED ORDER — VANCOMYCIN HCL 1000 MG IV SOLR
INTRAVENOUS | Status: AC
  Filled 2016-01-24: qty 1000

## 2016-01-24 MED ORDER — DEXTROSE 5 % IV SOLN
500.0000 mg | Freq: Four times a day (QID) | INTRAVENOUS | Status: DC | PRN
  Filled 2016-01-24: qty 5

## 2016-01-24 MED ORDER — PROPOFOL 10 MG/ML IV BOLUS
INTRAVENOUS | Status: AC
  Filled 2016-01-24: qty 40

## 2016-01-24 MED ORDER — FENTANYL CITRATE (PF) 100 MCG/2ML IJ SOLN
50.0000 ug | Freq: Once | INTRAMUSCULAR | Status: AC
  Administered 2016-01-24: 50 ug via INTRAVENOUS

## 2016-01-24 MED ORDER — OXYCODONE-ACETAMINOPHEN 5-325 MG PO TABS
1.0000 | ORAL_TABLET | ORAL | Status: DC | PRN
  Administered 2016-01-24 – 2016-01-25 (×6): 2 via ORAL
  Filled 2016-01-24 (×6): qty 2

## 2016-01-24 MED ORDER — HYDROMORPHONE HCL 1 MG/ML IJ SOLN
INTRAMUSCULAR | Status: AC
  Filled 2016-01-24: qty 2

## 2016-01-24 MED ORDER — FENTANYL CITRATE (PF) 100 MCG/2ML IJ SOLN
INTRAMUSCULAR | Status: AC
  Filled 2016-01-24: qty 2

## 2016-01-24 MED ORDER — ACETAMINOPHEN 325 MG PO TABS: 650.0000 mg | ORAL_TABLET | ORAL | Status: DC | PRN

## 2016-01-24 MED ORDER — HYDROMORPHONE HCL 1 MG/ML IJ SOLN
INTRAMUSCULAR | Status: AC
  Filled 2016-01-24: qty 1

## 2016-01-24 MED ORDER — ACETAMINOPHEN 10 MG/ML IV SOLN
1000.0000 mg | Freq: Once | INTRAVENOUS | Status: AC
  Administered 2016-01-24: 1000 mg via INTRAVENOUS

## 2016-01-24 MED ORDER — OXYCODONE HCL 5 MG PO TABS
ORAL_TABLET | ORAL | Status: AC
  Filled 2016-01-24: qty 2

## 2016-01-24 MED ORDER — MIDAZOLAM HCL 2 MG/2ML IJ SOLN
0.5000 mg | Freq: Once | INTRAMUSCULAR | Status: AC | PRN
  Administered 2016-01-24: 0.5 mg via INTRAVENOUS

## 2016-01-24 MED ORDER — ROCURONIUM BROMIDE 10 MG/ML (PF) SYRINGE
PREFILLED_SYRINGE | INTRAVENOUS | Status: AC
  Filled 2016-01-24: qty 20

## 2016-01-24 MED ORDER — GABAPENTIN 100 MG PO CAPS
100.0000 mg | ORAL_CAPSULE | Freq: Three times a day (TID) | ORAL | Status: DC
  Administered 2016-01-24 – 2016-01-25 (×2): 100 mg via ORAL
  Filled 2016-01-24 (×2): qty 1

## 2016-01-24 MED ORDER — FENTANYL CITRATE (PF) 100 MCG/2ML IJ SOLN
INTRAMUSCULAR | Status: DC | PRN
  Administered 2016-01-24: 50 ug via INTRAVENOUS
  Administered 2016-01-24: 200 ug via INTRAVENOUS
  Administered 2016-01-24 (×5): 50 ug via INTRAVENOUS

## 2016-01-24 MED ORDER — LIDOCAINE 2% (20 MG/ML) 5 ML SYRINGE
INTRAMUSCULAR | Status: DC | PRN
  Administered 2016-01-24: 20 mg via INTRAVENOUS

## 2016-01-24 MED ORDER — ONDANSETRON HCL 4 MG/2ML IJ SOLN
4.0000 mg | INTRAMUSCULAR | Status: DC | PRN
Start: 2016-01-24 — End: 2016-01-25
  Administered 2016-01-24 – 2016-01-25 (×4): 4 mg via INTRAVENOUS
  Filled 2016-01-24 (×4): qty 2

## 2016-01-24 MED ORDER — SODIUM CHLORIDE 0.9 % IR SOLN
Status: DC | PRN
  Administered 2016-01-24: 500 mL

## 2016-01-24 MED ORDER — LIDOCAINE 2% (20 MG/ML) 5 ML SYRINGE
INTRAMUSCULAR | Status: AC
  Filled 2016-01-24: qty 5

## 2016-01-24 MED ORDER — MENTHOL 3 MG MT LOZG: 1.0000 | LOZENGE | OROMUCOSAL | Status: DC | PRN

## 2016-01-24 MED ORDER — EPHEDRINE SULFATE 50 MG/ML IJ SOLN
INTRAMUSCULAR | Status: AC
  Filled 2016-01-24: qty 1

## 2016-01-24 MED ORDER — CHLORTHALIDONE 50 MG PO TABS
50.0000 mg | ORAL_TABLET | Freq: Every day | ORAL | Status: DC
  Administered 2016-01-25: 50 mg via ORAL
  Filled 2016-01-24: qty 1

## 2016-01-24 MED ORDER — SUCRALFATE 1 G PO TABS
1.0000 g | ORAL_TABLET | Freq: Three times a day (TID) | ORAL | Status: DC
  Administered 2016-01-24 – 2016-01-25 (×4): 1 g via ORAL
  Filled 2016-01-24 (×6): qty 1

## 2016-01-24 MED ORDER — AMLODIPINE BESYLATE 5 MG PO TABS
5.0000 mg | ORAL_TABLET | Freq: Every day | ORAL | Status: DC
  Administered 2016-01-24 – 2016-01-25 (×2): 5 mg via ORAL
  Filled 2016-01-24 (×2): qty 1

## 2016-01-24 MED ORDER — HYDROMORPHONE HCL 1 MG/ML IJ SOLN
0.5000 mg | INTRAMUSCULAR | Status: AC | PRN
  Administered 2016-01-24 (×4): 0.5 mg via INTRAVENOUS

## 2016-01-24 MED ORDER — PHENOL 1.4 % MT LIQD
1.0000 | OROMUCOSAL | Status: DC | PRN
Start: 2016-01-24 — End: 2016-01-25

## 2016-01-24 MED ORDER — LACTATED RINGERS IV SOLN: 1000.0000 mL | Freq: Once | INTRAVENOUS | Status: DC

## 2016-01-24 MED ORDER — SODIUM CHLORIDE 0.9% FLUSH
3.0000 mL | Freq: Two times a day (BID) | INTRAVENOUS | Status: DC
  Administered 2016-01-24 – 2016-01-25 (×2): 3 mL via INTRAVENOUS

## 2016-01-24 MED ORDER — CHLORHEXIDINE GLUCONATE CLOTH 2 % EX PADS: 6.0000 | MEDICATED_PAD | Freq: Once | CUTANEOUS | Status: DC

## 2016-01-24 MED ORDER — LINAGLIPTIN 5 MG PO TABS
5.0000 mg | ORAL_TABLET | Freq: Every day | ORAL | Status: DC
  Administered 2016-01-24 – 2016-01-25 (×2): 5 mg via ORAL
  Filled 2016-01-24 (×2): qty 1

## 2016-01-24 MED ORDER — ONDANSETRON HCL 4 MG/2ML IJ SOLN
INTRAMUSCULAR | Status: AC
  Filled 2016-01-24: qty 6

## 2016-01-24 MED ORDER — OXYCODONE HCL 5 MG PO TABS
10.0000 mg | ORAL_TABLET | Freq: Four times a day (QID) | ORAL | Status: DC | PRN
  Administered 2016-01-24: 10 mg via ORAL

## 2016-01-24 MED ORDER — METHOCARBAMOL 500 MG PO TABS
500.0000 mg | ORAL_TABLET | Freq: Four times a day (QID) | ORAL | Status: DC | PRN
  Administered 2016-01-24 – 2016-01-25 (×5): 500 mg via ORAL
  Filled 2016-01-24 (×4): qty 1

## 2016-01-24 MED ORDER — TESTOSTERONE CYPIONATE 200 MG/ML IM SOLN: 200.0000 mg | INTRAMUSCULAR | Status: DC

## 2016-01-24 MED ORDER — TAMSULOSIN HCL 0.4 MG PO CAPS
0.4000 mg | ORAL_CAPSULE | Freq: Every day | ORAL | Status: DC
  Administered 2016-01-24 – 2016-01-25 (×2): 0.4 mg via ORAL
  Filled 2016-01-24 (×2): qty 1

## 2016-01-24 MED ORDER — HYDROMORPHONE HCL 1 MG/ML IJ SOLN
0.5000 mg | INTRAMUSCULAR | Status: DC | PRN
  Administered 2016-01-24 – 2016-01-25 (×4): 1 mg via INTRAVENOUS
  Filled 2016-01-24 (×6): qty 1

## 2016-01-24 MED ORDER — SUGAMMADEX SODIUM 200 MG/2ML IV SOLN
INTRAVENOUS | Status: DC | PRN
  Administered 2016-01-24: 200 mg via INTRAVENOUS

## 2016-01-24 MED ORDER — LACTATED RINGERS IV SOLN
INTRAVENOUS | Status: DC | PRN
  Administered 2016-01-24 (×3): via INTRAVENOUS

## 2016-01-24 MED ORDER — FENTANYL CITRATE (PF) 250 MCG/5ML IJ SOLN
INTRAMUSCULAR | Status: AC
  Filled 2016-01-24: qty 5

## 2016-01-24 MED ORDER — KETOROLAC TROMETHAMINE 30 MG/ML IJ SOLN
30.0000 mg | Freq: Once | INTRAMUSCULAR | Status: AC
Start: 2016-01-24 — End: 2016-01-24
  Administered 2016-01-24: 30 mg via INTRAVENOUS
  Filled 2016-01-24: qty 1

## 2016-01-24 MED ORDER — MEPERIDINE HCL 25 MG/ML IJ SOLN: 6.2500 mg | INTRAMUSCULAR | Status: DC | PRN

## 2016-01-24 MED ORDER — THROMBIN 20000 UNITS EX SOLR
CUTANEOUS | Status: DC | PRN
  Administered 2016-01-24: 20000 [IU] via TOPICAL

## 2016-01-24 MED ORDER — HYDROMORPHONE HCL 1 MG/ML IJ SOLN
0.2500 mg | INTRAMUSCULAR | Status: DC | PRN
  Administered 2016-01-24 (×4): 0.5 mg via INTRAVENOUS

## 2016-01-24 MED ORDER — PANTOPRAZOLE SODIUM 40 MG PO TBEC
40.0000 mg | DELAYED_RELEASE_TABLET | Freq: Every day | ORAL | Status: DC
  Administered 2016-01-24 – 2016-01-25 (×2): 40 mg via ORAL
  Filled 2016-01-24 (×2): qty 1

## 2016-01-24 MED ORDER — ONDANSETRON 4 MG PO TBDP
4.0000 mg | ORAL_TABLET | Freq: Three times a day (TID) | ORAL | Status: DC | PRN
  Filled 2016-01-24: qty 1

## 2016-01-24 MED ORDER — HYDROMORPHONE HCL 1 MG/ML IJ SOLN
INTRAMUSCULAR | Status: AC
  Administered 2016-01-24: 1 mg
  Filled 2016-01-24: qty 1

## 2016-01-24 MED ORDER — SENNA 8.6 MG PO TABS
1.0000 | ORAL_TABLET | Freq: Two times a day (BID) | ORAL | Status: DC
  Administered 2016-01-24 – 2016-01-25 (×2): 8.6 mg via ORAL
  Filled 2016-01-24 (×2): qty 1

## 2016-01-24 MED ORDER — ROCURONIUM BROMIDE 100 MG/10ML IV SOLN: INTRAVENOUS | Status: DC | PRN

## 2016-01-24 MED ORDER — SODIUM CHLORIDE 0.9 % IV SOLN: INTRAVENOUS | Status: DC

## 2016-01-24 MED ORDER — THROMBIN 5000 UNITS EX SOLR
CUTANEOUS | Status: DC | PRN
  Administered 2016-01-24: 5000 [IU] via TOPICAL

## 2016-01-24 MED ORDER — SODIUM CHLORIDE 0.9 % IJ SOLN
INTRAMUSCULAR | Status: AC
  Filled 2016-01-24: qty 10

## 2016-01-24 MED ORDER — DOCUSATE SODIUM 100 MG PO CAPS
100.0000 mg | ORAL_CAPSULE | Freq: Two times a day (BID) | ORAL | Status: DC
  Administered 2016-01-24 – 2016-01-25 (×2): 100 mg via ORAL
  Filled 2016-01-24 (×2): qty 1

## 2016-01-24 MED ORDER — SODIUM CHLORIDE 0.9 % IV SOLN: 250.0000 mL | INTRAVENOUS | Status: DC

## 2016-01-24 MED ORDER — PHENYLEPHRINE 40 MCG/ML (10ML) SYRINGE FOR IV PUSH (FOR BLOOD PRESSURE SUPPORT)
PREFILLED_SYRINGE | INTRAVENOUS | Status: DC | PRN
  Administered 2016-01-24: 40 ug via INTRAVENOUS
  Administered 2016-01-24: 80 ug via INTRAVENOUS

## 2016-01-24 MED ORDER — PROMETHAZINE HCL 25 MG/ML IJ SOLN: 6.2500 mg | INTRAMUSCULAR | Status: DC | PRN

## 2016-01-24 MED ORDER — ROCURONIUM BROMIDE 10 MG/ML (PF) SYRINGE
PREFILLED_SYRINGE | INTRAVENOUS | Status: DC | PRN
  Administered 2016-01-24 (×4): 10 mg via INTRAVENOUS
  Administered 2016-01-24 (×2): 20 mg via INTRAVENOUS
  Administered 2016-01-24: 100 mg via INTRAVENOUS

## 2016-01-24 MED ORDER — PHENYLEPHRINE 40 MCG/ML (10ML) SYRINGE FOR IV PUSH (FOR BLOOD PRESSURE SUPPORT)
PREFILLED_SYRINGE | INTRAVENOUS | Status: AC
  Filled 2016-01-24: qty 10

## 2016-01-24 MED ORDER — KETOROLAC TROMETHAMINE 0.5 % OP SOLN
1.0000 [drp] | Freq: Four times a day (QID) | OPHTHALMIC | Status: DC
  Administered 2016-01-24 – 2016-01-25 (×4): 1 [drp] via OPHTHALMIC
  Filled 2016-01-24: qty 3

## 2016-01-24 MED ORDER — EPHEDRINE SULFATE-NACL 50-0.9 MG/10ML-% IV SOSY: PREFILLED_SYRINGE | INTRAVENOUS | Status: DC | PRN

## 2016-01-24 SURGICAL SUPPLY — 75 items
BAG DECANTER FOR FLEXI CONT (MISCELLANEOUS) ×3 IMPLANT
BENZOIN TINCTURE PRP APPL 2/3 (GAUZE/BANDAGES/DRESSINGS) IMPLANT
BIT DRILL 3.5 POWEREASE (BIT) ×2 IMPLANT
BIT DRILL 3.5MM POWEREASE (BIT) ×1
BLADE CLIPPER SURG (BLADE) ×3 IMPLANT
BLADE SURG 11 STRL SS (BLADE) ×3 IMPLANT
BUR MATCHSTICK NEURO 3.0 LAGG (BURR) ×3 IMPLANT
BUR PRECISION FLUTE 5.0 (BURR) ×6 IMPLANT
CANISTER SUCT 3000ML PPV (MISCELLANEOUS) ×3 IMPLANT
CLOSURE WOUND 1/2 X4 (GAUZE/BANDAGES/DRESSINGS)
CONT SPEC 4OZ CLIKSEAL STRL BL (MISCELLANEOUS) ×3 IMPLANT
COVER BACK TABLE 60X90IN (DRAPES) ×3 IMPLANT
DECANTER SPIKE VIAL GLASS SM (MISCELLANEOUS) ×3 IMPLANT
DEVICE INTERBODY ELEVATE 9X23 (Cage) ×6 IMPLANT
DRAPE C-ARM 42X72 X-RAY (DRAPES) ×3 IMPLANT
DRAPE C-ARMOR (DRAPES) ×3 IMPLANT
DRAPE LAPAROTOMY 100X72X124 (DRAPES) ×3 IMPLANT
DRAPE POUCH INSTRU U-SHP 10X18 (DRAPES) ×3 IMPLANT
DRAPE SURG 17X23 STRL (DRAPES) ×3 IMPLANT
DRSG OPSITE POSTOP 4X6 (GAUZE/BANDAGES/DRESSINGS) ×3 IMPLANT
DURAPREP 26ML APPLICATOR (WOUND CARE) ×3 IMPLANT
ELECT REM PT RETURN 9FT ADLT (ELECTROSURGICAL) ×3
ELECTRODE REM PT RTRN 9FT ADLT (ELECTROSURGICAL) ×1 IMPLANT
GAUZE SPONGE 4X4 12PLY STRL (GAUZE/BANDAGES/DRESSINGS) IMPLANT
GAUZE SPONGE 4X4 16PLY XRAY LF (GAUZE/BANDAGES/DRESSINGS) IMPLANT
GLOVE BIOGEL PI IND STRL 7.0 (GLOVE) ×2 IMPLANT
GLOVE BIOGEL PI IND STRL 7.5 (GLOVE) ×2 IMPLANT
GLOVE BIOGEL PI IND STRL 8.5 (GLOVE) ×1 IMPLANT
GLOVE BIOGEL PI INDICATOR 7.0 (GLOVE) ×4
GLOVE BIOGEL PI INDICATOR 7.5 (GLOVE) ×4
GLOVE BIOGEL PI INDICATOR 8.5 (GLOVE) ×2
GLOVE ECLIPSE 7.0 STRL STRAW (GLOVE) ×6 IMPLANT
GLOVE ECLIPSE 8.5 STRL (GLOVE) ×3 IMPLANT
GLOVE EXAM NITRILE LRG STRL (GLOVE) IMPLANT
GLOVE EXAM NITRILE MD LF STRL (GLOVE) IMPLANT
GLOVE EXAM NITRILE XL STR (GLOVE) IMPLANT
GLOVE EXAM NITRILE XS STR PU (GLOVE) IMPLANT
GOWN STRL REUS W/ TWL LRG LVL3 (GOWN DISPOSABLE) ×3 IMPLANT
GOWN STRL REUS W/ TWL XL LVL3 (GOWN DISPOSABLE) IMPLANT
GOWN STRL REUS W/TWL 2XL LVL3 (GOWN DISPOSABLE) ×3 IMPLANT
GOWN STRL REUS W/TWL LRG LVL3 (GOWN DISPOSABLE) ×6
GOWN STRL REUS W/TWL XL LVL3 (GOWN DISPOSABLE)
GRAFT DURAGEN MATRIX 1WX1L (Tissue) ×3 IMPLANT
HEMOSTAT POWDER KIT SURGIFOAM (HEMOSTASIS) ×3 IMPLANT
KIT BASIN OR (CUSTOM PROCEDURE TRAY) ×3 IMPLANT
KIT INFUSE X SMALL 1.4CC (Orthopedic Implant) ×3 IMPLANT
KIT ROOM TURNOVER OR (KITS) ×3 IMPLANT
LIQUID BAND (GAUZE/BANDAGES/DRESSINGS) ×3 IMPLANT
MILL MEDIUM DISP (BLADE) ×3 IMPLANT
NEEDLE HYPO 18GX1.5 BLUNT FILL (NEEDLE) IMPLANT
NEEDLE HYPO 25X1 1.5 SAFETY (NEEDLE) ×3 IMPLANT
NEEDLE SPNL 18GX3.5 QUINCKE PK (NEEDLE) IMPLANT
NS IRRIG 1000ML POUR BTL (IV SOLUTION) ×3 IMPLANT
PACK LAMINECTOMY NEURO (CUSTOM PROCEDURE TRAY) ×3 IMPLANT
PAD ARMBOARD 7.5X6 YLW CONV (MISCELLANEOUS) ×9 IMPLANT
ROD CC 30MM (Rod) ×3 IMPLANT
ROD COBALT 47.5X35 (Rod) ×3 IMPLANT
SCREW 5.5X30MM (Screw) ×3 IMPLANT
SCREW 5.5X35MM (Screw) ×6 IMPLANT
SCREW BN 35X5.5XMA NS SPNE (Screw) ×3 IMPLANT
SCREW SET SOLERA (Screw) ×8 IMPLANT
SCREW SET SOLERA TI (Screw) ×4 IMPLANT
SEALANT ADHERUS EXTEND TIP (MISCELLANEOUS) ×3 IMPLANT
SPONGE LAP 4X18 X RAY DECT (DISPOSABLE) IMPLANT
SPONGE SURGIFOAM ABS GEL 100 (HEMOSTASIS) ×3 IMPLANT
STRIP CLOSURE SKIN 1/2X4 (GAUZE/BANDAGES/DRESSINGS) IMPLANT
SUT VIC AB 0 CT1 18XCR BRD8 (SUTURE) ×2 IMPLANT
SUT VIC AB 0 CT1 8-18 (SUTURE) ×4
SUT VICRYL 3-0 RB1 18 ABS (SUTURE) ×6 IMPLANT
SYR 3ML LL SCALE MARK (SYRINGE) IMPLANT
TOWEL OR 17X24 6PK STRL BLUE (TOWEL DISPOSABLE) ×3 IMPLANT
TOWEL OR 17X26 10 PK STRL BLUE (TOWEL DISPOSABLE) ×3 IMPLANT
TRAP SPECIMEN MUCOUS 40CC (MISCELLANEOUS) ×3 IMPLANT
TRAY FOLEY W/METER SILVER 16FR (SET/KITS/TRAYS/PACK) ×3 IMPLANT
WATER STERILE IRR 1000ML POUR (IV SOLUTION) ×3 IMPLANT

## 2016-01-24 NOTE — Anesthesia Preprocedure Evaluation (Signed)
Anesthesia Evaluation  Patient identified by MRN, date of birth, ID band Patient awake    Reviewed: Allergy & Precautions, NPO status , Patient's Chart, lab work & pertinent test results  Airway Mallampati: II  TM Distance: >3 FB Neck ROM: Full    Dental  (+) Poor Dentition, Missing, Dental Advisory Given   Pulmonary neg pulmonary ROS,    breath sounds clear to auscultation       Cardiovascular hypertension, Pt. on medications (-) angina Rhythm:Regular Rate:Normal  '16 stress test: normal   Neuro/Psych Chronic back pain: narcotics negative psych ROS   GI/Hepatic Neg liver ROS, GERD  Controlled,  Endo/Other  diabetes (glu 105), Oral Hypoglycemic Agents  Renal/GU negative Renal ROS     Musculoskeletal   Abdominal (+) - obese,   Peds  Hematology negative hematology ROS (+)   Anesthesia Other Findings   Reproductive/Obstetrics                             Anesthesia Physical Anesthesia Plan  ASA: III  Anesthesia Plan: General   Post-op Pain Management:    Induction: Intravenous  Airway Management Planned: Oral ETT  Additional Equipment:   Intra-op Plan:   Post-operative Plan: Extubation in OR  Informed Consent: I have reviewed the patients History and Physical, chart, labs and discussed the procedure including the risks, benefits and alternatives for the proposed anesthesia with the patient or authorized representative who has indicated his/her understanding and acceptance.   Dental advisory given  Plan Discussed with: CRNA and Surgeon  Anesthesia Plan Comments: (Plan routine monitors, GETA)        Anesthesia Quick Evaluation

## 2016-01-24 NOTE — H&P (Signed)
CC:  Back and leg pain  HPI: Howard Murphy is a 60 year old man I'm seeing for a primary complaint of fairly chronic back pain and leg pain. He does have a history of surgery for a "cyst on his back" done at [facility] several years ago. He had very similar symptoms prior to his previous surgery. After that surgery, he says he got good amount of pain relief, however over the past several months he has been having recurrence of his pain. He describes fairly constant pain which runs across his lower back, and down the backs of his thighs to about the knee level. He does not have any pain below the knee or into the foot. He does not describe any significant numbness or tingling in the legs. He says the pain is worsened when he is sitting or standing for any length of time. He has been working with Dr. Dossie Arbour and has undergone epidural steroid injection which did not provide any relief. He has been on oxycodone, and gabapentin which does help somewhat.   PMH: Past Medical History:  Diagnosis Date  . 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  . Back ache 05/05/2013  . Diabetes mellitus without complication ([place])   . GERD (gastroesophageal reflux disease)   . Hyperlipemia   . Hypertension   . Kidney stones   . Nephrolithiasis   . Shortness of breath dyspnea     PSH: Past Surgical History:  Procedure Laterality Date  . BACK SURGERY  2015  . CHOLECYSTECTOMY  2000  . COLONOSCOPY WITH PROPOFOL N/A 04/11/2015   Procedure: COLONOSCOPY WITH PROPOFOL;  Surgeon: Manya Silvas, MD;  Location: [facility] ENDOSCOPY;  Service: Endoscopy;  Laterality: N/A;  . ESOPHAGOGASTRODUODENOSCOPY (EGD) WITH PROPOFOL N/A 04/11/2015   Procedure: ESOPHAGOGASTRODUODENOSCOPY (EGD) WITH PROPOFOL;  Surgeon: Manya Silvas, MD;  Location: [facility] ENDOSCOPY;  Service: Endoscopy;  Laterality: N/A;  . SHOULDER SURGERY Right 2016  . SHOULDER SURGERY Left 06/2015    SH: Social History  Substance Use Topics  . Smoking status: Never Smoker   . Smokeless tobacco: Former Systems developer    Types: Chew    Quit date: 01/18/2013  . Alcohol use No     Comment: occasional    MEDS: Prior to Admission medications   Medication Sig Start Date End Date Taking? Authorizing Provider  amLODipine (NORVASC) 5 MG tablet Take 1 tablet (5 mg total) by mouth daily. 09/06/15  Yes Roselee Nova, MD  chlorthalidone (HYGROTON) 50 MG tablet 50 mg daily.  09/30/15  Yes Historical Provider, MD  Cholecalciferol (VITAMIN D3) 5000 units TBDP Take by mouth daily.   Yes Historical Provider, MD  gabapentin (NEURONTIN) 100 MG capsule Take 1 capsule (100 mg total) by mouth every 8 (eight) hours. Follow titration schedule. 12/08/15  Yes Milinda Pointer, MD  Naloxone HCl (NARCAN) 4 MG/0.1ML LIQD Spray into one nostril. Repeat with second device into other nostril after 2-3 minutes if no or minimal response. 10/24/15  Yes Milinda Pointer, MD  omeprazole (PRILOSEC) 40 MG capsule Take 40 mg by mouth 2 (two) times daily. 02/07/15  Yes Historical Provider, MD  ondansetron (ZOFRAN ODT) 4 MG disintegrating tablet Allow 1-2 tablets to dissolve in your mouth every 8 hours as needed for nausea/vomiting 01/18/16  Yes Hinda Kehr, MD  Oxycodone HCl 10 MG TABS Take 1 tablet (10 mg total) by mouth every 6 (six) hours as needed. 12/08/15  Yes Milinda Pointer, MD  oxyCODONE-acetaminophen (ROXICET) 5-325 MG tablet Take 1-2 tablets by  mouth every 4 (four) hours as needed for severe pain. 01/18/16  Yes Hinda Kehr, MD  rosuvastatin (CRESTOR) 20 MG tablet 20 mg daily.  09/30/15  Yes Historical Provider, MD  sitaGLIPtin (JANUVIA) 50 MG tablet Take 1 tablet (50 mg total) by mouth daily. 07/14/15  Yes Roselee Nova, MD  sucralfate (CARAFATE) 1 G tablet Take 1 tablet by mouth 4 (four) times daily. 03/15/15  Yes Historical Provider, MD  tamsulosin (FLOMAX) 0.4 MG CAPS capsule Take 1 tablet by mouth daily until you pass the kidney stone or no longer have symptoms 01/18/16  Yes Hinda Kehr, MD   testosterone cypionate (DEPOTESTOSTERONE CYPIONATE) 200 MG/ML injection Inject 200 mg into the muscle every 14 (fourteen) days. 05/12/12  Yes Historical Provider, MD    ALLERGY: Allergies  Allergen Reactions  . No Known Allergies     ROS: ROS  NEUROLOGIC EXAM: Awake, alert, oriented Memory and concentration grossly intact Speech fluent, appropriate CN grossly intact Motor exam: Upper Extremities Deltoid Bicep Tricep Grip  Right 5/5 5/5 5/5 5/5  Left 5/5 5/5 5/5 5/5   Lower Extremity IP Quad PF DF EHL  Right 5/5 5/5 5/5 5/5 5/5  Left 5/5 5/5 5/5 5/5 5/5   Sensation grossly intact to LT  [facility]: MRI of the lumbar spine dated 11/03/15 was reviewed and compared to prior MRI of 2015. This demonstrates development of slight grade 1 anterolisthesis of L4 on L5. There is recurrence of a cystic appearing mass behind the L5 vertebral body which appears to extend from the left L4 L5 facet joint, likely indicating recurrent synovial cyst. There is compression of the traversing left S1 nerve root. 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   PLAN: We'll plan on proceeding with decompression and stabilization at L4 L5 by lumbar interbody fusion and cortical screw stabilization   I did review the MRI findings with the patient. Treatment options at this point were discussed including continued conservative treatment, versus surgical decompression and fusion. Given that this is a recurrence and his pain is likely related to instability, I did recommend fusion rather than repeat decompression. I explained to the patient the details of the procedure, as well as the risks which include but are not limited to nerve root injury leading to leg or foot weakness and or bowel and bladder dysfunction, CSF leak, bleeding, and  infection. Possible outcomes of surgery were also discussed including the possibility of persistence of pain symptoms and the possiblity of accelerated adjacent level degeneration. The general risks of anesthesia were also reviewed including heart attack, stroke, and DVT/PE.   The patient understood our discussion and is willing to proceed with surgical decompression and fusion. All her questions were answered.

## 2016-01-24 NOTE — Anesthesia Postprocedure Evaluation (Signed)
Anesthesia Post Note  Patient: Howard Murphy  Procedure(s) Performed: Procedure(s) (LRB): POSTERIOR LUMBAR INTERBODY FUSION Lumbar four-five (N/A)  Patient location during evaluation: PACU Anesthesia Type: General Level of consciousness: awake and alert, oriented and patient cooperative Pain management: pain level controlled Vital Signs Assessment: post-procedure vital signs reviewed and stable Respiratory status: spontaneous breathing, nonlabored ventilation, respiratory function stable and patient connected to nasal cannula oxygen Cardiovascular status: blood pressure returned to baseline and stable Postop Assessment: no signs of nausea or vomiting Anesthetic complications: no    Last Vitals:  Vitals:   01/24/16 1315 01/24/16 1330  BP: (!) 148/101 (!) 177/79  Pulse: 93 91  Resp: 18 18  Temp:      Last Pain:  Vitals:   01/24/16 1300  TempSrc:   PainSc: 8                  Kayce Chismar,E. Nhi Butrum

## 2016-01-24 NOTE — Progress Notes (Signed)
Orthopedic Tech Progress Note Patient Details:  Howard Murphy 15-Mar-1956 [id number] Brace order completed by bio-tech vendor. Patient ID: Howard Murphy, male   DOB: [birth year], 60 y.o.   MRN: [id number]   Braulio Bosch 01/24/2016, 4:34 PM

## 2016-01-24 NOTE — Transfer of Care (Signed)
Immediate Anesthesia Transfer of Care Note  Patient: Howard Murphy  Procedure(s) Performed: Procedure(s): POSTERIOR LUMBAR INTERBODY FUSION Lumbar four-five (N/A)  Patient Location: PACU  Anesthesia Type:General  Level of Consciousness: awake, alert , oriented and patient cooperative  Airway & Oxygen Therapy: Patient Spontanous Breathing and Patient connected to nasal cannula oxygen  Post-op Assessment: Report given to RN and Post -op Vital signs reviewed and stable  Post vital signs: Reviewed and stable  Last Vitals:  Vitals:   01/24/16 0639  BP: (!) 171/86  Pulse: (!) 59  Resp: 18  Temp: 36.7 C    Last Pain:  Vitals:   01/24/16 0657  TempSrc:   PainSc: 6       Patients Stated Pain Goal: 4 ([id number])  Complications: No apparent anesthesia complications

## 2016-01-24 NOTE — Anesthesia Procedure Notes (Signed)
Procedure Name: Intubation Date/Time: 01/24/2016 7:58 AM Performed by: Everlean Cherry A Pre-anesthesia Checklist: Patient identified, Emergency Drugs available, Suction available and Patient being monitored Patient Re-evaluated:Patient Re-evaluated prior to inductionOxygen Delivery Method: Circle system utilized Preoxygenation: Pre-oxygenation with 100% oxygen Intubation Type: IV induction Ventilation: Mask ventilation without difficulty and Oral airway inserted - appropriate to patient size Laryngoscope Size: Sabra Heck and 2 Grade View: Grade I Tube type: Oral Tube size: 7.5 mm Number of attempts: 1 Airway Equipment and Method: Stylet Placement Confirmation: ETT inserted through vocal cords under direct vision,  positive ETCO2 and breath sounds checked- equal and bilateral Secured at: 24 cm Tube secured with: Tape Dental Injury: Teeth and Oropharynx as per pre-operative assessment

## 2016-01-24 NOTE — Op Note (Signed)
PREOP DIAGNOSIS:  1. Synovial cyst, left L4-5 2. Segmental instability, spondylolisthesis L4-5  POSTOP DIAGNOSIS: Same  PROCEDURE: 1. L4-5 laminectomy with facetectomy for decompression of exiting nerve roots, more than would be required for placement of interbody graft 2. Resection of synovial cyst, left L4-5 3. Placement of anterior interbody device - 20mm Medtronic expandable cage x2 4. Posterior instrumentation using cortical pedicle screws at L4 - L5 5. Interbody arthrodesis, L4-5 6. Use of locally harvested bone autograft 7. Use of non-structural bone allograft - BMP  SURGEON: Dr. Consuella Lose, MD  ASSISTANT: Dr. Kristeen Miss, MD  ANESTHESIA: General Endotracheal  EBL: 250cc  SPECIMENS: None  DRAINS: None  COMPLICATIONS: None immediate  CONDITION: Hemodynamically stable to PACU  HISTORY: Howard Murphy is a 60 y.o. male who has been evaluated in the outpatient neurosurgery clinic, with a prior history of L4-5 surgery for synovial cyst. He really presented with back and leg pain, with MRI demonstrating development of spondylolisthesis, and recurrence of a L4-5 synovial cyst. Treatment options were discussed, and he elected to proceed with surgical decompression and fusion at the L4-5 level given the radiographic findings. Risks, benefits, and alternatives to surgery were all discussed in the office. After all questions were answered, informed consent was obtained and witnessed.  PROCEDURE IN DETAIL: After informed consent was obtained and witnessed, the patient was brought to the operating room. After induction of general anesthesia, the patient was positioned on the operative table in the prone position. All pressure points were meticulously padded. Incision was then marked out and prepped and draped in the usual sterile fashion.  After timeout was conducted, skin was infiltrated with local anesthetic. Skin incision was then made sharply and Bovie electrocautery was  used to dissect the subcutaneous tissue until the lumbodorsal fascia was identified and incised. The muscle was then elevated in the subperiosteal plane and the L4 and L5 lamina, inferior L3-4 and superior L4-5 facet complexes were identified. Self-retaining retractors were then placed.  Using intraoperative fluoroscopy, entry points for bilateral L4 cortical pedicle screws were identified. Pilot holes were then created under lateral fluoroscopy and tapped to 5.5 x 35 mm.  At this point attention was turned to decompression. Complete L4 laminectomy with facetectomy was completed using a combination of Kerrison rongeurs and a high-speed drill. I did note significant diastases of the L4-5 facet joints. In addition, on the left side, the inferior articulating process of L4 appeared to be fractured. Synovial cyst was identified extending slightly inferiorly from the left L4-5 facet capsule. This was significantly adherent to the dura. During the course of dissection, CSF egress was seen, although no obvious dural defect was identified. A portion of the cyst wall was left adherent to the dura, while the remainder of the facet was removed. Good decompression of the exiting and traversing roots was confirmed.  Disc space was then identified, incised, and using a combination of shavers, curettes and rongeurs, complete discectomy was completed. Endplates were prepared, and a 9 mm expandable cage was tapped into place on both sides, and expanded to 13 mm.  Bone harvested during decompression was mixed with BMP and packed into the interspace. Good position was confirmed with fluoroscopy.  At this point, the L5 cortical pedicle screws were drilled and tapped to 5.5 mm. Screws were then placed in L4 and L5 Rod was then placed, set screws placed and final tightened. Final AP and lateral fluoroscopic images confirmed good position.  The region where the synovial cyst was adherent  to the lateral dura was then covered with  a small piece of DuraGen. This was then covered with Adherus dural sealant.  The wound was then irrigated with copious amounts of antibiotic saline, then closed in standard fashion using a combination of interrupted 0 and 3-0 Vicryl stitches in the muscular, fascial, and subcutaneous layers. Skin was then closed using standard Dermabond. Sterile dressing was then applied. The patient was then transferred to the stretcher, extubated, and taken to the postanesthesia care unit in stable hemodynamic condition.  At the end of the case all sponge, needle, cottonoid, and instrument counts were correct.

## 2016-01-24 NOTE — Progress Notes (Signed)
Orthopedic Tech Progress Note Patient Details:  Howard Murphy [birth year] [id number]  Patient ID: [name], male   DOB: [birth year], 60 y.o.   MRN: [id number]   Hildred Priest 01/24/2016, 3:11 PM Called in bio-tech brace order; spoke with Colletta Maryland

## 2016-01-24 NOTE — Progress Notes (Signed)
Patient complained of  Uncontrolled pain, pain level does not go down below 8/10 inspite pain med. On call MD-Dr Ellene Route called and new order received: Toradol added to pain regimen. Will monitor closely.   01/24/16 2023  Vitals  Temp 98.7 F (37.1 C)  Temp Source Oral  BP 118/79  BP Location Right Arm  BP Method Automatic  Patient Position (if appropriate) Lying  Pulse Rate 74  Pulse Rate Source Dinamap  Resp 18  Oxygen Therapy  SpO2 94 %  O2 Device Room Air

## 2016-01-24 NOTE — OR Nursing (Signed)
Pt w/ very difficult pain management and frequent consultation with Dr. Glennon Mac and Dr. Kathyrn Sheriff. Both informed of patient's episode w/ kidney stone 2 days prior to px.  Patient had not informed MDs before sx.  Patient with decribed 6 on scale of 1 to 10 and vss before tx to [facility]. Pt's report that best pain management pre-op had been 6. Dr. Glennon Mac informed and approves to tx of pt with level 6.   Pt also c/o itchy eye that appeared releived with cold compress. Upon tx to [facility] c/o sustained itchiness and burning. Caryl Pina RN informed and will seek further orders.

## 2016-01-25 LAB — CBC
HEMATOCRIT: 47.6 % (ref 39.0–52.0)
HEMOGLOBIN: 15.4 g/dL (ref 13.0–17.0)
MCH: 29.6 pg (ref 26.0–34.0)
MCHC: 32.4 g/dL (ref 30.0–36.0)
MCV: 91.4 fL (ref 78.0–100.0)
Platelets: 187 10*3/uL (ref 150–400)
RBC: 5.21 MIL/uL (ref 4.22–5.81)
RDW: 14.1 % (ref 11.5–15.5)
WBC: 13.9 10*3/uL — AB (ref 4.0–10.5)

## 2016-01-25 LAB — BASIC METABOLIC PANEL
ANION GAP: 5 (ref 5–15)
BUN: 9 mg/dL (ref 6–20)
CHLORIDE: 104 mmol/L (ref 101–111)
CO2: 29 mmol/L (ref 22–32)
Calcium: 8.8 mg/dL — ABNORMAL LOW (ref 8.9–10.3)
Creatinine, Ser: 1.12 mg/dL (ref 0.61–1.24)
GFR calc non Af Amer: >60 mL/min (ref >60)
Glucose, Bld: 122 mg/dL — ABNORMAL HIGH (ref 65–99)
Potassium: 4.4 mmol/L (ref 3.5–5.1)
Sodium: 138 mmol/L (ref 135–145)

## 2016-01-25 LAB — GLUCOSE, CAPILLARY
GLUCOSE-CAPILLARY: 154 mg/dL — AB (ref 65–99)
Glucose-Capillary: 140 mg/dL — ABNORMAL HIGH (ref 65–99)

## 2016-01-25 MED ORDER — KETOROLAC TROMETHAMINE 15 MG/ML IJ SOLN
15.0000 mg | Freq: Four times a day (QID) | INTRAMUSCULAR | Status: DC
  Administered 2016-01-25: 15 mg via INTRAVENOUS
  Filled 2016-01-25: qty 1

## 2016-01-25 MED ORDER — VANCOMYCIN HCL 1000 MG IV SOLR
INTRAVENOUS | Status: AC
  Filled 2016-01-25: qty 1000

## 2016-01-25 MED ORDER — METHOCARBAMOL 500 MG PO TABS: 500.0000 mg | ORAL_TABLET | Freq: Four times a day (QID) | ORAL | 0 refills | Status: DC | PRN

## 2016-01-25 MED ORDER — OXYCODONE-ACETAMINOPHEN 10-325 MG PO TABS: 1.0000 | ORAL_TABLET | ORAL | 0 refills | Status: DC | PRN

## 2016-01-25 NOTE — Evaluation (Signed)
Physical Therapy Evaluation Patient Details Name: Howard Murphy MRN: [id number] DOB: December 16, 1955 Today's Date: 01/25/2016   History of Present Illness  Patient is a 60 y/o male with hx of HTN, HLD, DM presents s/p L4-5 PLIF.  Clinical Impression  Patient presents with post surgical deficits s/p above surgery. Tolerated gait training and stair training with [place] for safety/balance. Reviewed back precautions, brace and bed mobility. Pt will have support at home at discharge. Pt ambulating Mod I and does not require further skilled therapy services. All education completed. Discharge from therapy.    Follow Up Recommendations No PT follow up;Supervision - Intermittent    Equipment Recommendations  None recommended by PT    Recommendations for Other Services       Precautions / Restrictions Precautions Precautions: Back Precaution Booklet Issued: No Precaution Comments: Reviewed precautions Required Braces or Orthoses: Spinal Brace Spinal Brace: Lumbar corset Restrictions Weight Bearing Restrictions: No      Mobility  Bed Mobility               General bed mobility comments: Up in chair upon PT arrival.  Transfers Overall transfer level: Needs assistance Equipment used: None Transfers: Sit to/from Stand Sit to Stand: Modified independent (Device/Increase time)         General transfer comment: Stood from chair without difficulty.   Ambulation/Gait Ambulation/Gait assistance: Modified independent (Device/Increase time) Ambulation Distance (Feet): 200 Feet Assistive device: Straight cane Gait Pattern/deviations: Step-through pattern;Decreased stride length Gait velocity: decreased Gait velocity interpretation: Below normal speed for age/gender General Gait Details: Slow, steady gait. No overt LOB.   Stairs Stairs: Yes Stairs assistance: Modified independent (Device/Increase time) Stair Management: With cane;One rail Left Number of Stairs: 2 General stair  comments: Cues for technique and safety.   Wheelchair Mobility    Modified Rankin (Stroke Patients Only)       Balance Overall balance assessment: Needs assistance Sitting-balance support: Feet supported;No upper extremity supported Sitting balance-Leahy Scale: Good     Standing balance support: During functional activity Standing balance-Leahy Scale: Fair Standing balance comment: Able to donn brace with setup, no LOB.                             Pertinent Vitals/Pain Pain Assessment: Faces Faces Pain Scale: No hurt    Home Living Family/patient expects to be discharged to:: Private residence Living Arrangements: Spouse/significant other Available Help at Discharge: Family Type of Home: House Home Access: Stairs to enter Entrance Stairs-Rails: Right Entrance Stairs-Number of Steps: 2 Home Layout: One level Home Equipment: None      Prior Function Level of Independence: Independent               Hand Dominance        Extremity/Trunk Assessment   Upper Extremity Assessment: Defer to OT evaluation           Lower Extremity Assessment: Generalized weakness         Communication   Communication: No difficulties  Cognition Arousal/Alertness: Awake/alert Behavior During Therapy: WFL for tasks assessed/performed Overall Cognitive Status: Within Functional Limits for tasks assessed                      General Comments      Exercises        Assessment/Plan    PT Assessment Patent does not need any further PT services  PT Diagnosis Difficulty walking   PT Problem  List    PT Treatment Interventions     PT Goals (Current goals can be found in the Care Plan section) Acute Rehab PT Goals Patient Stated Goal: to ride his motorcycle PT Goal Formulation: All assessment and education complete, DC therapy Time For Goal Achievement: 02/08/16 Potential to Achieve Goals: Good    Frequency     Barriers to discharge         Co-evaluation               End of Session Equipment Utilized During Treatment: Gait belt;Back brace Activity Tolerance: Patient tolerated treatment well Patient left: in chair;with call bell/phone within reach;with family/visitor present Nurse Communication: Mobility status         Time: 0920-0933 PT Time Calculation (min) (ACUTE ONLY): 13 min   Charges:   PT Evaluation $PT Eval Low Complexity: 1 Procedure     PT G Codes:        Kaijah Abts A Mihcael Ledee 01/25/2016, 10:06 AM Wray Kearns, PT, DPT [phone]

## 2016-01-25 NOTE — Discharge Summary (Signed)
  Physician Discharge Summary  Patient ID: Howard Murphy MRN: [id number] DOB/AGE: 07-06-1955 60 y.o.  Admit date: 01/24/2016 Discharge date: 01/25/2016  Admission Diagnoses: Lumbar synovial cyst, spondylolisthesis  Discharge Diagnoses: Same Active Problems:   Lumbar facet (L4-5) synovial cyst (6 mm) (Left)   Discharged Condition: Stable  Hospital Course:  Mrs. Howard Murphy is a 60 y.o. male electively admitted after L4-5 PLIF. He did well postop, although he required significant pain medicine. He was ambulating well, tolerating diet, voiding normally.  Treatments: Surgery - PLIF, L4-5  Discharge Exam: Blood pressure 122/73, pulse 80, temperature 98.8 F (37.1 C), temperature source Oral, resp. rate 20, weight 83 kg (183 lb), SpO2 96 %. Awake, alert, oriented Speech fluent, appropriate CN grossly intact 5/5 BUE/BLE Wound c/d/i, no leak.  Disposition: 01-Home or Self Care     Medication List    STOP taking these medications   naloxone HCl 4 MG/0.1ML Liqd Commonly known as:  NARCAN   Oxycodone HCl 10 MG Tabs   oxyCODONE-acetaminophen 5-325 MG tablet Commonly known as:  ROXICET Replaced by:  oxyCODONE-acetaminophen 10-325 MG tablet     TAKE these medications   amLODipine 5 MG tablet Commonly known as:  NORVASC Take 1 tablet (5 mg total) by mouth daily.   chlorthalidone 50 MG tablet Commonly known as:  HYGROTON 50 mg daily.   gabapentin 100 MG capsule Commonly known as:  NEURONTIN Take 1 capsule (100 mg total) by mouth every 8 (eight) hours. Follow titration schedule.   methocarbamol 500 MG tablet Commonly known as:  ROBAXIN Take 1 tablet (500 mg total) by mouth every 6 (six) hours as needed for muscle spasms.   omeprazole 40 MG capsule Commonly known as:  PRILOSEC Take 40 mg by mouth 2 (two) times daily.   ondansetron 4 MG disintegrating tablet Commonly known as:  ZOFRAN ODT Allow 1-2 tablets to dissolve in your mouth every 8 hours as needed for  nausea/vomiting   oxyCODONE-acetaminophen 10-325 MG tablet Commonly known as:  PERCOCET Take 1 tablet by mouth every 4 (four) hours as needed for pain. Replaces:  oxyCODONE-acetaminophen 5-325 MG tablet   rosuvastatin 20 MG tablet Commonly known as:  CRESTOR 20 mg daily.   sitaGLIPtin 50 MG tablet Commonly known as:  JANUVIA Take 1 tablet (50 mg total) by mouth daily.   sucralfate 1 g tablet Commonly known as:  CARAFATE Take 1 tablet by mouth 4 (four) times daily.   tamsulosin 0.4 MG Caps capsule Commonly known as:  FLOMAX Take 1 tablet by mouth daily until you pass the kidney stone or no longer have symptoms   testosterone cypionate 200 MG/ML injection Commonly known as:  DEPOTESTOSTERONE CYPIONATE Inject 200 mg into the muscle every 14 (fourteen) days.   Vitamin D3 5000 units Tbdp Take by mouth daily.      Follow-up Information    Lamesha Tibbits, C, MD Follow up in 3 week(s).   Specialty:  Neurosurgery Contact information: [address] [place] [address] [place] [address] [phone]           Signed: Consuella Lose, Loletha Grayer 01/25/2016, 2:02 PM

## 2016-01-25 NOTE — Progress Notes (Signed)
Pt given D/C instructions with Rx's, verbal understanding was provided. Pt's incision is clean and dry with no sign of infection. Pt's IV was removed prior to D/C. Pt D/C'd home via walking @ 1830 per MD order. Pt is stable @ D/C and has no other needs at this time. Holli Humbles, RN

## 2016-01-26 ENCOUNTER — Ambulatory Visit: Payer: Medicare Other | Admitting: Pain Medicine

## 2016-01-30 ENCOUNTER — Other Ambulatory Visit: Payer: Self-pay | Admitting: Family Medicine

## 2016-01-30 DIAGNOSIS — I10 Essential (primary) hypertension: Secondary | ICD-10-CM

## 2016-01-30 DIAGNOSIS — E1142 Type 2 diabetes mellitus with diabetic polyneuropathy: Secondary | ICD-10-CM

## 2016-01-30 MED ORDER — AMLODIPINE BESYLATE 5 MG PO TABS: 5.0000 mg | ORAL_TABLET | Freq: Every day | ORAL | 1 refills | Status: DC

## 2016-01-30 MED ORDER — LISINOPRIL 20 MG PO TABS: 20.0000 mg | ORAL_TABLET | Freq: Every day | ORAL | 1 refills | Status: DC

## 2016-01-30 MED ORDER — SITAGLIPTIN PHOSPHATE 50 MG PO TABS: 50.0000 mg | ORAL_TABLET | Freq: Every day | ORAL | 1 refills | Status: DC

## 2016-02-15 DIAGNOSIS — Z6829 Body mass index (BMI) 29.0-29.9, adult: Secondary | ICD-10-CM | POA: Diagnosis not present

## 2016-02-15 DIAGNOSIS — M4316 Spondylolisthesis, lumbar region: Secondary | ICD-10-CM | POA: Diagnosis not present

## 2016-02-15 DIAGNOSIS — I10 Essential (primary) hypertension: Secondary | ICD-10-CM | POA: Diagnosis not present

## 2016-02-15 DIAGNOSIS — M7138 Other bursal cyst, other site: Secondary | ICD-10-CM | POA: Diagnosis not present

## 2016-02-21 ENCOUNTER — Ambulatory Visit: Payer: Medicare Other | Admitting: Family Medicine

## 2016-02-23 ENCOUNTER — Ambulatory Visit: Payer: Medicare Other | Admitting: Pain Medicine

## 2016-02-24 ENCOUNTER — Encounter: Payer: Self-pay | Admitting: Family Medicine

## 2016-02-24 ENCOUNTER — Ambulatory Visit (INDEPENDENT_AMBULATORY_CARE_PROVIDER_SITE_OTHER): Payer: Medicare Other | Admitting: Family Medicine

## 2016-02-24 DIAGNOSIS — E1142 Type 2 diabetes mellitus with diabetic polyneuropathy: Secondary | ICD-10-CM

## 2016-02-24 DIAGNOSIS — E781 Pure hyperglyceridemia: Secondary | ICD-10-CM | POA: Diagnosis not present

## 2016-02-24 DIAGNOSIS — E7800 Pure hypercholesterolemia, unspecified: Secondary | ICD-10-CM | POA: Diagnosis not present

## 2016-02-24 DIAGNOSIS — M4806 Spinal stenosis, lumbar region: Secondary | ICD-10-CM

## 2016-02-24 DIAGNOSIS — Z5181 Encounter for therapeutic drug level monitoring: Secondary | ICD-10-CM

## 2016-02-24 DIAGNOSIS — M48061 Spinal stenosis, lumbar region without neurogenic claudication: Secondary | ICD-10-CM

## 2016-02-24 DIAGNOSIS — E876 Hypokalemia: Secondary | ICD-10-CM

## 2016-02-24 LAB — LIPID PANEL
CHOL/HDL RATIO: 5.1 ratio — AB (ref <=5.0)
CHOLESTEROL: 223 mg/dL — AB (ref 125–200)
HDL: 44 mg/dL (ref >=40)
LDL Cholesterol: 131 mg/dL — ABNORMAL HIGH (ref <130)
Triglycerides: 239 mg/dL — ABNORMAL HIGH (ref <150)
VLDL: 48 mg/dL — ABNORMAL HIGH (ref <30)

## 2016-02-24 LAB — COMPLETE METABOLIC PANEL WITH GFR
ALBUMIN: 4.2 g/dL (ref 3.6–5.1)
ALK PHOS: 103 U/L (ref 40–115)
ALT: 23 U/L (ref 9–46)
AST: 16 U/L (ref 10–35)
BUN: 12 mg/dL (ref 7–25)
CALCIUM: 9.4 mg/dL (ref 8.6–10.3)
CHLORIDE: 103 mmol/L (ref 98–110)
CO2: 28 mmol/L (ref 20–31)
CREATININE: 0.95 mg/dL (ref 0.70–1.25)
GFR, Est Non African American: 87 mL/min (ref >=60)
Glucose, Bld: 86 mg/dL (ref 65–99)
POTASSIUM: 4.3 mmol/L (ref 3.5–5.3)
Sodium: 140 mmol/L (ref 135–146)
Total Bilirubin: 0.5 mg/dL (ref 0.2–1.2)
Total Protein: 7 g/dL (ref 6.1–8.1)

## 2016-02-24 MED ORDER — GABAPENTIN 300 MG PO CAPS: ORAL_CAPSULE | ORAL | 0 refills | Status: DC

## 2016-02-24 NOTE — Assessment & Plan Note (Signed)
Check SGPT at statin

## 2016-02-24 NOTE — Patient Instructions (Signed)
Please do see your eye doctor regularly, and have your eyes examined every year (or more often per his or her recommendation) Check your feet every night and let me know right away of any sores, infections, numbness, etc. Try to limit sweets, white bread, white rice, white potatoes It is okay with me for you to not check your fingerstick blood sugars (per [employer] of Endocrinology Best Practices), unless you are interested and feel it would be helpful for you  Start back up on the gabapentin

## 2016-02-24 NOTE — Assessment & Plan Note (Signed)
Start back on gabapentin; pt will schedule eye exam; foot exam done today; not on metformin because of GI symptoms; on Januvia

## 2016-02-24 NOTE — Progress Notes (Signed)
Pulse 78   Temp 98.5 F (36.9 C) (Oral)   Wt 179 lb (81.2 kg)   SpO2 92%   BMI 28.89 kg/m    Subjective:    Patient ID: Howard Murphy, male    DOB: May 01, 1956, 60 y.o.   MRN: [id number]  HPI: Howard Murphy is a 60 y.o. male  Chief Complaint  Patient presents with  . Medication Refill   Patient is new to me, having seen a colleague in March  Chronic low back pain; sees Dr. Dossie Arbour; has had 3 shoulder surgeries and 2 back surgeries recently; rotator cuff, knew something wasn't right; pain never did go away; then had revision at [facility]; right shoulder, then went back to the left shoulder in December; then back surgery; right-handed; using oxycodone prescribed by back surgeon  High cholesterol; high TG, low HDL; on medicine, crestor without side effects; watching fatty meats; no more than 3 eggs a week; high chol runs in the family  Diabetes; A1c last month only 6.3; cut out sweets and sugary drinks; not checking feet; does like white bread, will try to switch white wheat; burning neuropathy in the both feet; last eye exam a few years ago  Pt denies RA  Depression screen [facility] 2/9 02/24/2016 01/05/2016 12/08/2015 11/21/2015 10/03/2015  Decreased Interest 0 0 0 0 0  Down, Depressed, Hopeless 0 0 0 0 0  PHQ - 2 Score 0 0 0 0 0   Relevant past medical, surgical, family and social history reviewed Past Medical History:  Diagnosis Date  . 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  . Back ache 05/05/2013  . Diabetes mellitus without complication ([place])   . GERD (gastroesophageal reflux disease)   . Hyperlipemia   . Hypertension   . Kidney stones   . Nephrolithiasis   . Shortness of breath dyspnea    Past Surgical History:  Procedure Laterality Date  . BACK SURGERY  2015  . CHOLECYSTECTOMY  2000  . COLONOSCOPY WITH PROPOFOL N/A 04/11/2015   Procedure: COLONOSCOPY WITH PROPOFOL;  Surgeon: Manya Silvas, MD;  Location: [facility] ENDOSCOPY;  Service: Endoscopy;  Laterality: N/A;  .  ESOPHAGOGASTRODUODENOSCOPY (EGD) WITH PROPOFOL N/A 04/11/2015   Procedure: ESOPHAGOGASTRODUODENOSCOPY (EGD) WITH PROPOFOL;  Surgeon: Manya Silvas, MD;  Location: [facility] ENDOSCOPY;  Service: Endoscopy;  Laterality: N/A;  . SHOULDER SURGERY Right 2016  . SHOULDER SURGERY Left 06/2015   Family History  Problem Relation Age of Onset  . Cancer Mother     Breast CA  . Stroke Father   . Heart disease Father   . Cancer Sister   . Cancer Maternal Grandmother   . Cancer Sister   . Bladder Cancer Neg Hx   . Prostate cancer Neg Hx   . Kidney cancer Neg Hx    Social History  Substance Use Topics  . Smoking status: Never Smoker  . Smokeless tobacco: Former Systems developer    Types: Chew    Quit date: 01/18/2013  . Alcohol use No     Comment: occasional   Interim medical history since last visit reviewed. Allergies and medications reviewed  Review of Systems Per HPI unless specifically indicated above     Objective:    Pulse 78   Temp 98.5 F (36.9 C) (Oral)   Wt 179 lb (81.2 kg)   SpO2 92%   BMI 28.89 kg/m   Wt Readings from Last 3 Encounters:  02/24/16 179 lb (81.2 kg)  01/24/16 183 lb (83 kg)  01/19/16 183 lb 9.6 oz (83.3 kg)    Physical Exam  Constitutional: He appears well-developed and well-nourished. No distress.  HENT:  Head: Normocephalic and atraumatic.  Eyes: EOM are normal. No scleral icterus.  Neck: No thyromegaly present.  Cardiovascular: Normal rate and regular rhythm.   Pulmonary/Chest: Effort normal and breath sounds normal.  Abdominal: Soft. Bowel sounds are normal. He exhibits no distension.  Musculoskeletal: He exhibits no edema.  Neurological: Coordination normal.  Skin: Skin is warm and dry. No pallor.  Psychiatric: He has a normal mood and affect. His behavior is normal. Judgment and thought content normal.   Diabetic Foot Form - Detailed   Diabetic Foot Exam - detailed Diabetic Foot exam was performed with the following findings:  Yes 02/24/2016  3:15 PM    Visual Foot Exam completed.:  Yes  Normal Range of Motion:  Yes Pulse Foot Exam completed.:  Yes  Right Dorsalis Pedis:  Present Left Dorsalis Pedis:  Present  Sensory Foot Exam Completed.:  Yes Swelling:  No Semmes-Weinstein Monofilament Test R Site 1-Great Toe:  Pos L Site 1-Great Toe:  Pos  R Site 4:  Neg L Site 4:  Neg  R Site 5:  Neg L Site 5:  Neg          Assessment & Plan:   Problem List Items Addressed This Visit      Endocrine   Type 2 diabetes mellitus with peripheral neuropathy (HCC)    Last A1c just done in August, 6.3; shows good control; foot exam by MD today; eye exams yearly; limit sweets, starches      Relevant Medications   gabapentin (NEURONTIN) 300 MG capsule   Diabetic peripheral neuropathy (HCC) (Chronic)    Start back on gabapentin; pt will schedule eye exam; foot exam done today; not on metformin because of GI symptoms; on Januvia      Relevant Medications   gabapentin (NEURONTIN) 300 MG capsule   Other Relevant Orders   Microalbumin / creatinine urine ratio (Completed)     Other   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) (Chronic)    This and multiple other diagnoses relating to pain, cervical spine, lumbar spine, all managed by pain clinic      Hypokalemia    During chart review, I found a critically low K+ of 2.9; will recheck this      Hypertriglyceridemia    Check labs fasting; goal TG less than 150; weight loss, healthy eating      Relevant Medications   chlorthalidone (HYGROTON) 50 MG tablet   Hypercholesteremia    Check lipids      Relevant Medications   chlorthalidone (HYGROTON) 50 MG tablet   Other Relevant Orders   Lipid panel (Completed)   Encounter for medication monitoring    Check SGPT at [facility]      Relevant Orders   COMPLETE METABOLIC PANEL WITH GFR (Completed)    Other Visit Diagnoses   None.     Follow up plan: Return in about 3 months (around 05/25/2016) for fasting labs and visit with Dr.  Sanda Klein.  An after-visit summary was printed and given to the patient at [place].  Please see the patient instructions which may contain other information and recommendations beyond what is mentioned above in the assessment and plan.  Meds ordered this encounter  Medications  . chlorthalidone (HYGROTON) 50 MG tablet  . methylPREDNISolone (MEDROL DOSEPAK) 4 MG TBPK tablet  . ondansetron (ZOFRAN-ODT) 8 MG disintegrating tablet  .  gabapentin (NEURONTIN) 300 MG capsule    Sig: One pill by mouth at bedtime x 3 nights, then one twice a day for 3 days, then one three times a day    Dispense:  90 capsule    Refill:  0    Orders Placed This Encounter  Procedures  . COMPLETE METABOLIC PANEL WITH GFR  . Lipid panel  . Microalbumin / creatinine urine ratio

## 2016-02-24 NOTE — Assessment & Plan Note (Signed)
Check lipids 

## 2016-02-25 LAB — MICROALBUMIN / CREATININE URINE RATIO
Creatinine, Urine: 182 mg/dL (ref 20–370)
MICROALB UR: 1.1 mg/dL
Microalb Creat Ratio: 6 mcg/mg creat (ref <30)

## 2016-02-27 ENCOUNTER — Other Ambulatory Visit: Payer: Self-pay | Admitting: Family Medicine

## 2016-02-27 DIAGNOSIS — E7800 Pure hypercholesterolemia, unspecified: Secondary | ICD-10-CM

## 2016-02-27 DIAGNOSIS — Z5181 Encounter for therapeutic drug level monitoring: Secondary | ICD-10-CM

## 2016-02-27 MED ORDER — ROSUVASTATIN CALCIUM 40 MG PO TABS: 40.0000 mg | ORAL_TABLET | Freq: Every day | ORAL | 1 refills | Status: DC

## 2016-02-27 NOTE — Assessment & Plan Note (Signed)
Check sgpt in 6-8 weeks 

## 2016-02-27 NOTE — Progress Notes (Signed)
Orders for lipids and sgpt, higher dose of crestor

## 2016-02-27 NOTE — Assessment & Plan Note (Signed)
Increase crestor, recheck lipids in 6-8 weeks

## 2016-02-29 NOTE — Assessment & Plan Note (Signed)
During chart review, I found a critically low K+ of 2.9; will recheck this

## 2016-02-29 NOTE — Assessment & Plan Note (Signed)
Check labs fasting; goal TG less than 150; weight loss, healthy eating

## 2016-02-29 NOTE — Assessment & Plan Note (Signed)
This and multiple other diagnoses relating to pain, cervical spine, lumbar spine, all managed by pain clinic

## 2016-02-29 NOTE — Assessment & Plan Note (Signed)
Last A1c just done in August, 6.3; shows good control; foot exam by MD today; eye exams yearly; limit sweets, starches

## 2016-03-01 ENCOUNTER — Telehealth: Payer: Self-pay

## 2016-03-01 ENCOUNTER — Telehealth: Payer: Self-pay | Admitting: Family Medicine

## 2016-03-01 MED ORDER — NALOXEGOL OXALATE 12.5 MG PO TABS: 12.5000 mg | ORAL_TABLET | Freq: Every day | ORAL | 0 refills | Status: DC

## 2016-03-01 NOTE — Telephone Encounter (Signed)
I see that he takes opioids, so I'll send in [place] which is approved for opioid-induced constipation If this isn't enough, call us and I can adjust the dose

## 2016-03-01 NOTE — Telephone Encounter (Signed)
Left detailed voicemail

## 2016-03-01 NOTE — Telephone Encounter (Signed)
Patient called states has been on Linzess in the past but does not recall what dosage? Wants to see about getting refilled?

## 2016-03-01 NOTE — Telephone Encounter (Signed)
Pharmacy has a question about a prescription that the patient brought in. Please call

## 2016-03-01 NOTE — Telephone Encounter (Signed)
Spoke with Howard Murphy and informed them that it is okay to fill surgeon's Rx for oxycodone 10-325 mg for post surgical pain.

## 2016-03-06 ENCOUNTER — Telehealth: Payer: Self-pay | Admitting: Family Medicine

## 2016-03-06 NOTE — Telephone Encounter (Signed)
Patient was seen on 02-24-16. Went to pharmacy and they had all his prescriptions except Testosterone. Please send to [facility]

## 2016-03-06 NOTE — Telephone Encounter (Signed)
Patient notified would need to get that through urology.

## 2016-03-15 ENCOUNTER — Other Ambulatory Visit: Payer: Self-pay | Admitting: Urology

## 2016-03-15 MED ORDER — TAMSULOSIN HCL 0.4 MG PO CAPS: ORAL_CAPSULE | ORAL | 3 refills | Status: DC

## 2016-03-15 NOTE — Telephone Encounter (Signed)
COMPLETED

## 2016-03-19 ENCOUNTER — Encounter: Payer: Medicare Other | Admitting: Pain Medicine

## 2016-03-19 NOTE — Progress Notes (Deleted)
Patient's Name: Howard Murphy  MRN: [id number]  Referring Provider: Roselee Nova, MD  DOB: [birth year]  PCP: Enid Derry, MD  DOS: 03/19/2016  Note by: Kathlen Brunswick. Dossie Arbour, MD  Service setting: Ambulatory outpatient  Specialty: Interventional Pain Management  Location: [facility] (AMB) Pain Management Facility    Patient type: Established   Primary Reason(s) for Visit: Encounter for prescription drug management & post-procedure evaluation of chronic illness with mild to moderate exacerbation(Level of risk: moderate) CC: No chief complaint on file.  HPI  Howard Murphy is a 60 y.o. year old, male patient, who comes today for an initial evaluation. He has Benign fibroma of prostate; Elevated hemoglobin ([place]); Acid reflux; BP (high blood pressure); Hypercholesteremia; History of lumbar facet Synovial cyst, surgically removed.; Breath shortness; Chronic abdominal pain (RUQ); Type 2 diabetes mellitus with peripheral neuropathy ([place]); Chest pain; Hypertriglyceridemia; Chronic pain; Opiate use (60 MME/Day); Long term prescription opiate use; Long term current use of opiate analgesic; Chronic low back pain (Bilateral) (L>R); Lumbar spondylosis; Lumbar facet syndrome (Bilateral) (L>R); Chronic hip pain (Left); Diabetic peripheral neuropathy ([place]); Chronic lumbar radicular pain (S1 Dermatome) (Bilateral) (L>R); Hypokalemia; Complete tear of the shoulder rotator cuff (Left); Chronic shoulder pain (Location of Primary Source of Pain) (Bilateral) (L>R); Non-insulin dependent type 2 diabetes mellitus ([place]); Chronic foot pain (Location of Secondary source of pain) (Bilateral) (L>R); Chronic hand pain (Location of Tertiary source of pain) (Bilateral) (R>L); Lumbar facet hypertrophy; Encounter for therapeutic drug level monitoring; Encounter for pain management planning; Low testosterone; Occipital pain (Right); Failed back surgical syndrome 2; Chronic low back pain with left-sided sciatica; Radicular pain of shoulder  (Right); Carpal tunnel syndrome (Bilateral); Chronic cervical radicular pain (Right); Abnormal MRI, lumbar spine; Chronic sacroiliac joint pain (Bilateral) (L>R); Neurogenic pain; Cervical spondylosis; Cervical facet arthropathy; Chronic neck pain (Bilateral) (R>L); Cervical facet syndrome (Bilateral) (R>L); Chronic upper extremity pain (Bilateral) (R>L); Cervical foraminal stenosis;  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 His primarily concern today is the No chief complaint on file.  Pain Assessment: Self-Reported Pain Score:  /10             Reported level is compatible with observation.          The patient comes into the clinics today for post-procedure evaluation on the interventional treatment done on 01/05/2016. In addition, he comes in today for pharmacological management of his chronic pain.  The patient  reports that he does not use drugs.       Controlled Substance Pharmacotherapy Assessment & REMS (Risk Evaluation and Mitigation Strategy)  Analgesic: *** MME/day: *** mg/day.  Pill Count: *** Pharmacokinetics: Onset of action (Liberation/Absorption): Within expected pharmacological parameters Time to Peak effect (Distribution): Timing and results are as within normal expected parameters Duration of action (Metabolism/Excretion): Within normal limits for medication Pharmacodynamics: Analgesic Effect: More than 50% Activity Facilitation: Medication(s) allow patient to sit, stand, walk, and do the basic ADLs Perceived Effectiveness: Described as relatively effective, allowing for increase in activities of daily living (ADL) Side-effects or Adverse  reactions: None reported Monitoring: [place] PMP: Online review of the past 42-month period conducted. Compliant with practice rules and regulations List of all UDS test(s) done:  Lab Results  Component Value Date  TOXASSSELUR FINAL 11/21/2015   TOXASSSELUR FINAL 10/03/2015   SUMMARY FINAL 09/05/2015   Last UDS on record: [employer]ox[employer] Select 13  Date Value Ref Range Status  11/21/2015 FINAL  Final    Comment:    ==================================================================== TOXASSURE SELECT 13 (MW) ==================================================================== Test                             Result       Flag       Units Drug Present and Declared for Prescription Verification   Oxycodone                      912          EXPECTED   ng/mg creat   Oxymorphone                    863          EXPECTED   ng/mg creat   Noroxycodone                   1428         EXPECTED   ng/mg creat   Noroxymorphone                 302          EXPECTED   ng/mg creat    Sources of oxycodone are scheduled prescription medications.    Oxymorphone, noroxycodone, and noroxymorphone are expected    metabolites of oxycodone. Oxymorphone is also available as a    scheduled prescription medication. ==================================================================== Test                      Result    Flag   Units      Ref Range   Creatinine              218              mg/dL      >=20 ==================================================================== Declared Medications:  The flagging and interpretation on this report are based on the  following declared medications.  Unexpected results may arise from  inaccuracies in the declared medications.  **Note: The testing scope of this panel includes these medications:  Oxycodone  **Note: The testing scope of this panel does not include following  reported medications:  Amlodipine (Norvasc)  Chlorthalidone  Cyanocobalamin  Icosapent  Lisinopril   Naloxone  Omeprazole (Prilosec)  Ondansetron (Zofran)  Rosuvastatin (Crestor)  Sitagliptin (Januvia)  Sucralfate (Carafate)  Testosterone ==================================================================== For clinical consultation, please call [phone]. ====================================================================    Summary  Date Value Ref Range Status  09/05/2015 FINAL  Final    Comment:    ==================================================================== TOXASSURE COMP DRUG ANALYSIS,UR ==================================================================== Test                             Result       Flag       Units Drug Present not Declared for Prescription Verification   Oxycodone                      225          UNEXPECTED ng/mg creat   Oxymorphone                    455  UNEXPECTED ng/mg creat   Noroxycodone                   583          UNEXPECTED ng/mg creat   Noroxymorphone                 116          UNEXPECTED ng/mg creat    Sources of oxycodone are scheduled prescription medications.    Oxymorphone, noroxycodone, and noroxymorphone are expected    metabolites of oxycodone. Oxymorphone is also available as a    scheduled prescription medication.   Ibuprofen                      PRESENT      UNEXPECTED ==================================================================== Test                      Result    Flag   Units      Ref Range   Creatinine              87               mg/dL      >=20 ==================================================================== Declared Medications:  The flagging and interpretation on this report are based on the  following declared medications.  Unexpected results may arise from  inaccuracies in the declared medications.  **Note: The testing scope of this panel does not include following  reported medications:  Chlorthalidone  Lisinopril  Omeprazole  Ondansetron  Rosuvastatin  Sitagliptin   Sucralfate  Testosterone ==================================================================== For clinical consultation, please call [phone]. ====================================================================    UDS interpretation: Compliant          Medication Assessment Form: Reviewed. Patient indicates being compliant with therapy Treatment compliance: Compliant Risk Assessment: Aberrant Behavior: None observed today Substance Use Disorder (SUD) Risk Level: Low Risk of opioid abuse or dependence: 0.7-3.0% with doses ? 36 MME/day and 6.1-26% with doses ? 120 MME/day. Opioid Risk Tool (ORT) Score:      Depression Scale Score: PHQ-2:         PHQ-9:         Risk Mitigation Strategies:  Patient Counseling:  Completed Patient-Prescriber Agreement (PPA): Present and active  Notification to other healthcare providers: Done   Pharmacologic Plan: No change in therapy, at this time  Post-Procedure Assessment  Procedure done on [id number]: *** Complications experienced at the time of the procedure: None Side-effects or Adverse reactions: None reported Sedation: Please see nurses note. When no sedatives are used, the analgesic levels obtained are directly associated with the effectiveness of the local anesthetics. On the other hand, when sedation is provided, the level of analgesia obtained during the initial 1 hour, immediately following the intervention, is believed to be the result of a combination of factors. These factors may include, but are not limited to: 1. The effectiveness of the local anesthetics used. 2. The effects of the analgesic(s) and/or anxiolytic(s) used. 3. The degree of discomfort experienced by the patient at the time of the procedure. 4. The patients ability and reliability in recalling and recording the events. 5. The presence and influence of possible secondary gains. Results: Relief during the 1st hour after the procedure:   (Ultra-Short Term  Relief) Interpretative note: Analgesia during this period is likely to be Local Anesthetic and/or IV Sedative (Analgesic/Anxiolitic) related Local Anesthesia: Long-acting (4-6 hours) anesthetics used. The analgesic levels attained during this period are directly  associated to the localized infiltration of local anesthetics and therefore cary significant diagnostic value as to the etiological location or origin of the pain. Results: Relief during the next 4 to 6 hour after the procedure:   (Short Term Relief) Interpretative note: Complete relief confirms area to be the source of pain Long-Term Therapy: Steroids used. Results: Extended relief following procedure:   Interpretative note: Long-term benefit would suggest an inflammatory etiology to the pain.         Long-Term Benefits:  Current Relief (Now):          Interpretative note: Ongoing improvement of symptoms would suggest either persistent anti-inflammatory benefits or prolonged sympathectomy, in the case of sympathetically-mediated pain Interpretation of Results: ***         Laboratory Chemistry  Inflammation Markers Lab Results  Component Value Date   ESRSEDRATE 1 09/05/2015   CRP 0.6 09/05/2015   Renal Function Lab Results  Component Value Date   BUN 12 02/24/2016   CREATININE 0.95 02/24/2016   GFRAA >89 02/24/2016   GFRNONAA 87 02/24/2016   Hepatic Function Lab Results  Component Value Date   AST 16 02/24/2016   ALT 23 02/24/2016   ALBUMIN 4.2 02/24/2016   Electrolytes Lab Results  Component Value Date   NA 140 02/24/2016   K 4.3 02/24/2016   CL 103 02/24/2016   CALCIUM 9.4 02/24/2016   MG 2.1 09/05/2015   Pain Modulating Vitamins No results found for: Maralyn Sago [id number], [id number], [id number], 25OHVITD1, 25OHVITD2, 25OHVITD3, VITAMINB12 Coagulation Parameters Lab Results  Component Value Date   PLT 187 01/25/2016   Cardiovascular Lab Results  Component Value Date   HGB 15.4 01/25/2016   HCT 47.6  01/25/2016   Note: Lab results reviewed. Recent Diagnostic Imaging  No results found. Meds  The patient has a current medication list which includes the following prescription(s): amlodipine, chlorthalidone, vitamin d3, gabapentin, lisinopril, methocarbamol, methylprednisolone, naloxegol oxalate, omeprazole, ondansetron, ondansetron, oxycodone-acetaminophen, rosuvastatin, sitagliptin, sucralfate, tamsulosin, and testosterone cypionate.  Current Outpatient Prescriptions on File Prior to Visit  Medication Sig  . amLODipine (NORVASC) 5 MG tablet Take 1 tablet (5 mg total) by mouth daily.  . chlorthalidone (HYGROTON) 50 MG tablet   . Cholecalciferol (VITAMIN D3) 5000 units TBDP Take by mouth daily.  Marland Kitchen gabapentin (NEURONTIN) 300 MG capsule One pill by mouth at bedtime x 3 nights, then one twice a day for 3 days, then one three times a day  . lisinopril (PRINIVIL,ZESTRIL) 20 MG tablet Take 1 tablet (20 mg total) by mouth daily. Reported on 12/08/2015  . methocarbamol (ROBAXIN) 500 MG tablet Take 1 tablet (500 mg total) by mouth every 6 (six) hours as needed for muscle spasms.  . methylPREDNISolone (MEDROL DOSEPAK) 4 MG TBPK tablet   . naloxegol oxalate (MOVANTIK) 12.5 MG TABS tablet Take 1 tablet (12.5 mg total) by mouth daily.  Marland Kitchen omeprazole (PRILOSEC) 40 MG capsule Take 40 mg by mouth 2 (two) times daily.  . ondansetron (ZOFRAN ODT) 4 MG disintegrating tablet Allow 1-2 tablets to dissolve in your mouth every 8 hours as needed for nausea/vomiting  . ondansetron (ZOFRAN-ODT) 8 MG disintegrating tablet   . oxyCODONE-acetaminophen (PERCOCET) 10-325 MG tablet Take 1 tablet by mouth every 4 (four) hours as needed for pain.  . rosuvastatin (CRESTOR) 40 MG tablet Take 1 tablet (40 mg total) by mouth at bedtime.  . sitaGLIPtin (JANUVIA) 50 MG tablet Take 1 tablet (50 mg total) by mouth daily.  . sucralfate (CARAFATE) 1 G tablet Take 1  tablet by mouth 4 (four) times daily.  . tamsulosin (FLOMAX) 0.4 MG CAPS  capsule Take 1 tablet by mouth daily until you pass the kidney stone or no longer have symptoms  . testosterone cypionate (DEPOTESTOSTERONE CYPIONATE) 200 MG/ML injection Inject 200 mg into the muscle every 14 (fourteen) days.   No current facility-administered medications on file prior to visit.    ROS  Constitutional: Denies any fever or chills Gastrointestinal: No reported hemesis, hematochezia, vomiting, or acute GI distress Musculoskeletal: Denies any acute onset joint swelling, redness, loss of ROM, or weakness Neurological: No reported episodes of acute onset apraxia, aphasia, dysarthria, agnosia, amnesia, paralysis, loss of coordination, or loss of consciousness  Allergies  Mr. Naylor is allergic to no known allergies.  [place]  Medical:  Mr. Linehan  has a past medical history of Arthritis; Back ache (05/05/2013); Diabetes mellitus without complication ([place]); GERD (gastroesophageal reflux disease); Hyperlipemia; Hypertension; Kidney stones; Nephrolithiasis; and Shortness of breath dyspnea. Family: family history includes Cancer in his maternal grandmother, mother, sister, and sister; Heart disease in his father; Stroke in his father. Surgical:  has a past surgical history that includes Cholecystectomy (2000); Back surgery (2015); Shoulder surgery (Right, 2016); Colonoscopy with propofol (N/A, 04/11/2015); Esophagogastroduodenoscopy (egd) with propofol (N/A, 04/11/2015); and Shoulder surgery (Left, 06/2015). Tobacco:  reports that he has never smoked. He quit smokeless tobacco use about 3 years ago. His smokeless tobacco use included Chew. Alcohol:  reports that he does not drink alcohol. Drug:  reports that he does not use drugs.  Constitutional Exam  General appearance: Well nourished, well developed, and well hydrated. In no acute distress There were no vitals filed for this visit.BMI Assessment: Estimated body mass index is 28.89 kg/m as calculated from the following:   Height as  of 01/19/16: 5\' 6"  (1.676 m).   Weight as of 02/24/16: 179 lb (81.2 kg).   BMI interpretation:           BMI Readings from Last 4 Encounters:  02/24/16 28.89 kg/m  01/24/16 29.54 kg/m  01/19/16 29.63 kg/m  01/17/16 29.86 kg/m   Wt Readings from Last 4 Encounters:  02/24/16 179 lb (81.2 kg)  01/24/16 183 lb (83 kg)  01/19/16 183 lb 9.6 oz (83.3 kg)  01/17/16 185 lb (83.9 kg)  Psych/Mental status: Alert and oriented x 3 (person, place, & time) Eyes: PERLA Respiratory: No evidence of acute respiratory distress  Cervical Spine Exam  Inspection: No masses, redness, or swelling Alignment: Symmetrical Functional ROM: Unrestricted ROM Stability: No instability detected Muscle strength & Tone: Functionally intact Sensory: Unimpaired Palpation: Non-contributory  Upper Extremity (UE) Exam    Side: Right upper extremity  Side: Left upper extremity  Inspection: No masses, redness, swelling, or asymmetry  Inspection: No masses, redness, swelling, or asymmetry  Functional ROM: Unrestricted ROM         Functional ROM: Unrestricted ROM          Muscle strength & Tone: Functionally intact  Muscle strength & Tone: Functionally intact  Sensory: Unimpaired  Sensory: Unimpaired  Palpation: Non-contributory  Palpation: Non-contributory   Thoracic Spine Exam  Inspection: No masses, redness, or swelling Alignment: Symmetrical Functional ROM: Unrestricted ROM Stability: No instability detected Sensory: Unimpaired Muscle strength & Tone: Functionally intact Palpation: Non-contributory  Lumbar Spine Exam  Inspection: No masses, redness, or swelling Alignment: Symmetrical Functional ROM: Unrestricted ROM Stability: No instability detected Muscle strength & Tone: Functionally intact Sensory: Unimpaired Palpation: Non-contributory Provocative Tests: Lumbar Hyperextension and rotation test: evaluation deferred today  Patrick's Maneuver: evaluation deferred today              Gait &  Posture Assessment  Ambulation: Unassisted Gait: Relatively normal for age and body habitus Posture: WNL   Lower Extremity Exam    Side: Right lower extremity  Side: Left lower extremity  Inspection: No masses, redness, swelling, or asymmetry  Inspection: No masses, redness, swelling, or asymmetry  Functional ROM: Unrestricted ROM          Functional ROM: Unrestricted ROM          Muscle strength & Tone: Functionally intact  Muscle strength & Tone: Functionally intact  Sensory: Unimpaired  Sensory: Unimpaired  Palpation: Non-contributory  Palpation: Non-contributory   Assessment  Primary Diagnosis & Pertinent Problem List: The primary encounter diagnosis was Chronic pain syndrome. A diagnosis of Neurogenic pain was also pertinent to this visit.  Visit Diagnosis: 1. Chronic pain syndrome   2. Neurogenic pain    Plan of Care  Pharmacotherapy (Medications Ordered): No orders of the defined types were placed in this encounter.  New Prescriptions   No medications on file   Medications administered during this visit: Mr. Mom had no medications administered during this visit. Lab-work, Procedure(s), & Referral(s) Ordered: No orders of the defined types were placed in this encounter.  Imaging & Referral(s) Ordered: None  Interventional Therapies: Scheduled: None at this time.    Considering: Diagnostic bilateral cervical facet block under fluoroscopic guidance and IV sedation. Possible bilateral cervical facet radiofrequency ablation under fluoroscopic guidance and IV sedation. Diagnostic right-sided greater occipital nerve block under fluoroscopic guidance, with a without sedation. Possible right-sided greater occipital nerve radiofrequency ablation under fluoroscopic guidance and IV sedation. Possible right-sided peripheral nerve stimulator trial under fluoroscopic guidance and IV sedation. Diagnostic right-sided cervical epidural steroid injection under fluoroscopic  guidance, with a without sedation. Diagnostic bilateral intra-articular shoulder injection under fluoroscopic guidance, with or without sedation. Diagnostic bilateral suprascapular nerve block under fluoroscopic guidance, with or without sedation. Possible bilateral suprascapular nerve radiofrequency ablation under fluoroscopic guidance and IV sedation. Diagnostic bilateral sacroiliac joint block under fluoroscopic guidance, with a without sedation. Possible bilateral sacroiliac joint radiofrequency ablation under fluoroscopic guidance and IV sedation. Diagnostic bilateral lumbar facet block under fluoroscopic guidance and IV sedation. Possible bilateral lumbar facet radiofrequency ablation under fluoroscopic guidance and IV sedation. Diagnostic left L4-5 transforaminal epidural steroid injection under fluoroscopic guidance, with a without sedation. Diagnostic left-sided L4-5 lumbar epidural steroid injection under fluoroscopic guidance with or without sedation.  Diagnostic left intra-articular hip joint injection under fluoroscopic guidance, with or without sedation.  Possible left hip joint radiofrequency ablation under fluoroscopic guidance and IV sedation.  Diagnostic bilateral median nerve block (carpal tunnel block) without fluoroscopy or IV sedation.  Diagnostic right-sided celiac plexus block under fluoroscopic guidance and IV sedation for the abdominal pain.   PRN Procedures: Diagnostic bilateral cervical facet block under fluoroscopic guidance and IV sedation. Diagnostic right-sided greater occipital nerve block under fluoroscopic guidance, with a without sedation. Diagnostic right-sided cervical epidural steroid injection under fluoroscopic guidance, with a without sedation. Diagnostic bilateral intra-articular shoulder injection under fluoroscopic guidance, with or without sedation. Diagnostic bilateral suprascapular nerve block under fluoroscopic guidance, with or without  sedation. Diagnostic bilateral sacroiliac joint block under fluoroscopic guidance, with a without sedation.. Diagnostic bilateral lumbar facet block under fluoroscopic guidance and IV sedation. Diagnostic left L4-5 transforaminal epidural steroid injection under fluoroscopic guidance, with a without sedation. Diagnostic left-sided L4-5 lumbar epidural steroid injection under fluoroscopic guidance with or  without sedation. Diagnostic left intra-articular hip joint injection under fluoroscopic guidance, with or without sedation.    Requested PM Follow-up: No Follow-up on file.  Future Appointments Date Time Provider [place]  05/31/2016 9:00 AM Arnetha Courser, MD [place] None  06/19/2016 11:00 AM Pearletha Alfred None    Primary Care Physician: Enid Derry, MD Location: [facility] Management Facility Note by: Kathlen Brunswick. Dossie Arbour, M.D, DABA, DABAPM, DABPM, DABIPP, FIPP  Pain Score Disclaimer: We use the NRS-11 scale. This is a self-reported, subjective measurement of pain severity with only modest accuracy. It is used primarily to identify changes within a particular patient. It must be understood that outpatient pain scales are significantly less accurate that those used for research, where they can be applied under ideal controlled circumstances with minimal exposure to variables. In reality, the score is likely to be a combination of pain intensity and pain affect, where pain affect describes the degree of emotional arousal or changes in action readiness caused by the sensory experience of pain. Factors such as social and work situation, setting, emotional state, anxiety levels, expectation, and prior pain experience may influence pain perception and show large inter-individual differences that may also be affected by time variables.  Patient instructions provided during this appointment: There are no Patient Instructions on file for this visit.

## 2016-03-23 ENCOUNTER — Other Ambulatory Visit: Payer: Self-pay | Admitting: Family Medicine

## 2016-03-23 NOTE — Telephone Encounter (Signed)
Six month Rx approved

## 2016-03-28 DIAGNOSIS — M7138 Other bursal cyst, other site: Secondary | ICD-10-CM | POA: Diagnosis not present

## 2016-03-28 DIAGNOSIS — Z6830 Body mass index (BMI) 30.0-30.9, adult: Secondary | ICD-10-CM | POA: Diagnosis not present

## 2016-03-28 DIAGNOSIS — M4316 Spondylolisthesis, lumbar region: Secondary | ICD-10-CM | POA: Diagnosis not present

## 2016-04-03 DIAGNOSIS — M7138 Other bursal cyst, other site: Secondary | ICD-10-CM | POA: Diagnosis not present

## 2016-04-09 ENCOUNTER — Other Ambulatory Visit (HOSPITAL_COMMUNITY): Payer: Self-pay | Admitting: Neurosurgery

## 2016-04-09 ENCOUNTER — Other Ambulatory Visit: Payer: Self-pay | Admitting: Neurosurgery

## 2016-04-09 DIAGNOSIS — M7138 Other bursal cyst, other site: Secondary | ICD-10-CM

## 2016-04-10 ENCOUNTER — Telehealth: Payer: Self-pay | Admitting: Family Medicine

## 2016-04-10 NOTE — Telephone Encounter (Signed)
Notice received from [employer] that patient may not be taking his crestor (rosuvastatin) as directed ------------------ Please call patient Find out if any issues with the crestor (aches, cost, etc.?) Find out if any way we can help If not, just reinforce compliance Thank you

## 2016-04-12 ENCOUNTER — Telehealth: Payer: Self-pay

## 2016-04-12 NOTE — Telephone Encounter (Signed)
Good Morning!, I hope you get better soon. It's not the same without you.. :) ..Marland KitchenI called the pt and he stated that he is taking Crestor but he have times were he forgets to take it. No issues with the medication so far but I tried to go over some ways to help him remember his medication. I suggest to try to put his medication bottle on the sink so when he see it after his nigh routine he remembers to take it and or put it somewhere  that he usually has a nightly routine until he can get the hang of taking it. It has to program mentally to take his medication so the best way to do that is try to put taking his medication in his nightly routine.

## 2016-04-12 NOTE — Telephone Encounter (Signed)
Thank you :)

## 2016-04-12 NOTE — Telephone Encounter (Signed)
Dr. lada received a notice from his insurance saying that the pt might not be taking his medication as directed. Called pt and asked if there is any problems with his Crestor or is there anything we need to do on our end to help him with the medication. PT state he still take his medication just not ever night. I went over so ways he can try to remember to take it. I reinforce the importance of his medication and trying to remember to take it. Pt understood and end call.

## 2016-04-13 ENCOUNTER — Other Ambulatory Visit: Payer: Self-pay

## 2016-04-13 DIAGNOSIS — I10 Essential (primary) hypertension: Secondary | ICD-10-CM

## 2016-04-13 DIAGNOSIS — E1142 Type 2 diabetes mellitus with diabetic polyneuropathy: Secondary | ICD-10-CM

## 2016-04-13 MED ORDER — AMLODIPINE BESYLATE 5 MG PO TABS: 5.0000 mg | ORAL_TABLET | Freq: Every day | ORAL | 1 refills | Status: DC

## 2016-04-13 MED ORDER — LISINOPRIL 20 MG PO TABS: 20.0000 mg | ORAL_TABLET | Freq: Every day | ORAL | 1 refills | Status: DC

## 2016-04-13 MED ORDER — SITAGLIPTIN PHOSPHATE 50 MG PO TABS: 50.0000 mg | ORAL_TABLET | Freq: Every day | ORAL | 1 refills | Status: DC

## 2016-04-13 NOTE — Telephone Encounter (Signed)
Please remind patient it's time for fasting labs; if he can come in the next week or two, that would be great; thank you I'll refill the BP meds and DM med The carafate is not meant to be a long-acting medicine; that is usually used short term to help heal ulcers; if he is having significant GI complaints, appt please

## 2016-04-16 ENCOUNTER — Other Ambulatory Visit: Payer: Self-pay

## 2016-04-16 DIAGNOSIS — Z5181 Encounter for therapeutic drug level monitoring: Secondary | ICD-10-CM

## 2016-04-16 DIAGNOSIS — E7800 Pure hypercholesterolemia, unspecified: Secondary | ICD-10-CM

## 2016-04-16 NOTE — Telephone Encounter (Signed)
Thank you for double checking; I see the old BMP order from March from Dr. Manuella Ghazi; I reviewed his labs from Sept and I don't need the BMP; thanks for being so thorough

## 2016-04-16 NOTE — Telephone Encounter (Signed)
Patient notified.  Did he just need a lipid and alt? What about BMP?

## 2016-05-01 ENCOUNTER — Ambulatory Visit
Admission: RE | Admit: 2016-05-01 | Discharge: 2016-05-01 | Disposition: A | Discharge status: Home / Self Care | Payer: Medicare Other | Source: Ambulatory Visit | Attending: Neurosurgery | Admitting: Neurosurgery

## 2016-05-01 DIAGNOSIS — M48061 Spinal stenosis, lumbar region without neurogenic claudication: Secondary | ICD-10-CM | POA: Insufficient documentation

## 2016-05-01 DIAGNOSIS — M7138 Other bursal cyst, other site: Secondary | ICD-10-CM | POA: Diagnosis not present

## 2016-05-01 DIAGNOSIS — Z981 Arthrodesis status: Secondary | ICD-10-CM | POA: Insufficient documentation

## 2016-05-01 DIAGNOSIS — M5126 Other intervertebral disc displacement, lumbar region: Secondary | ICD-10-CM | POA: Diagnosis not present

## 2016-05-01 LAB — POCT I-STAT CREATININE: CREATININE: 1 mg/dL (ref 0.61–1.24)

## 2016-05-01 MED ORDER — GADOBENATE DIMEGLUMINE 529 MG/ML IV SOLN
15.0000 mL | Freq: Once | INTRAVENOUS | Status: AC | PRN
  Administered 2016-05-01: 15 mL via INTRAVENOUS

## 2016-05-07 DIAGNOSIS — J029 Acute pharyngitis, unspecified: Secondary | ICD-10-CM | POA: Diagnosis not present

## 2016-05-11 DIAGNOSIS — E119 Type 2 diabetes mellitus without complications: Secondary | ICD-10-CM | POA: Diagnosis not present

## 2016-05-11 DIAGNOSIS — Z1389 Encounter for screening for other disorder: Secondary | ICD-10-CM | POA: Diagnosis not present

## 2016-05-11 DIAGNOSIS — R7309 Other abnormal glucose: Secondary | ICD-10-CM | POA: Diagnosis not present

## 2016-05-11 DIAGNOSIS — I10 Essential (primary) hypertension: Secondary | ICD-10-CM | POA: Diagnosis not present

## 2016-05-11 DIAGNOSIS — E785 Hyperlipidemia, unspecified: Secondary | ICD-10-CM | POA: Diagnosis not present

## 2016-05-11 DIAGNOSIS — E291 Testicular hypofunction: Secondary | ICD-10-CM | POA: Diagnosis not present

## 2016-05-14 DIAGNOSIS — I10 Essential (primary) hypertension: Secondary | ICD-10-CM | POA: Diagnosis not present

## 2016-05-14 DIAGNOSIS — Z6830 Body mass index (BMI) 30.0-30.9, adult: Secondary | ICD-10-CM | POA: Diagnosis not present

## 2016-05-14 DIAGNOSIS — M4316 Spondylolisthesis, lumbar region: Secondary | ICD-10-CM | POA: Diagnosis not present

## 2016-05-15 ENCOUNTER — Other Ambulatory Visit: Payer: Self-pay | Admitting: Neurosurgery

## 2016-05-15 DIAGNOSIS — M4316 Spondylolisthesis, lumbar region: Secondary | ICD-10-CM

## 2016-05-30 ENCOUNTER — Ambulatory Visit
Admission: RE | Admit: 2016-05-30 | Discharge: 2016-05-30 | Disposition: A | Discharge status: Home / Self Care | Payer: Medicare Other | Source: Ambulatory Visit | Attending: Neurosurgery | Admitting: Neurosurgery

## 2016-05-30 ENCOUNTER — Other Ambulatory Visit: Payer: Self-pay | Admitting: Neurosurgery

## 2016-05-30 DIAGNOSIS — M792 Neuralgia and neuritis, unspecified: Secondary | ICD-10-CM | POA: Diagnosis not present

## 2016-05-30 DIAGNOSIS — M4316 Spondylolisthesis, lumbar region: Secondary | ICD-10-CM

## 2016-05-30 MED ORDER — METHYLPREDNISOLONE ACETATE 40 MG/ML INJ SUSP (RADIOLOG
120.0000 mg | Freq: Once | INTRAMUSCULAR | Status: AC
  Administered 2016-05-30: 120 mg via EPIDURAL

## 2016-05-30 MED ORDER — IOPAMIDOL (ISOVUE-M 200) INJECTION 41%
1.0000 mL | Freq: Once | INTRAMUSCULAR | Status: AC
  Administered 2016-05-30: 1 mL via EPIDURAL

## 2016-05-30 NOTE — Discharge Instructions (Signed)

## 2016-05-31 ENCOUNTER — Ambulatory Visit: Payer: Medicare Other | Admitting: Family Medicine

## 2016-06-05 ENCOUNTER — Other Ambulatory Visit: Payer: Medicare Other

## 2016-06-07 ENCOUNTER — Ambulatory Visit: Admission: RE | Admit: 2016-06-07 | Payer: Medicare Other | Source: Ambulatory Visit

## 2016-06-08 ENCOUNTER — Other Ambulatory Visit: Payer: Medicare Other

## 2016-06-13 DIAGNOSIS — M4316 Spondylolisthesis, lumbar region: Secondary | ICD-10-CM | POA: Diagnosis not present

## 2016-06-13 DIAGNOSIS — I10 Essential (primary) hypertension: Secondary | ICD-10-CM | POA: Diagnosis not present

## 2016-06-13 DIAGNOSIS — Z6831 Body mass index (BMI) 31.0-31.9, adult: Secondary | ICD-10-CM | POA: Diagnosis not present

## 2016-06-19 ENCOUNTER — Ambulatory Visit: Payer: Medicare Other | Admitting: Urology

## 2016-06-20 ENCOUNTER — Ambulatory Visit: Payer: Medicare Other | Admitting: Urology

## 2016-06-25 ENCOUNTER — Ambulatory Visit
Admission: RE | Admit: 2016-06-25 | Discharge: 2016-06-25 | Disposition: A | Discharge status: Home / Self Care | Payer: Medicare Other | Source: Ambulatory Visit | Attending: Pain Medicine | Admitting: Pain Medicine

## 2016-06-25 ENCOUNTER — Encounter: Payer: Self-pay | Admitting: Pain Medicine

## 2016-06-25 ENCOUNTER — Ambulatory Visit (HOSPITAL_BASED_OUTPATIENT_CLINIC_OR_DEPARTMENT_OTHER): Payer: Medicare Other | Admitting: Pain Medicine

## 2016-06-25 VITALS — BP 153/91 | HR 74 | Temp 97.9°F | Resp 15 | Ht 67.0 in | Wt 195.0 lb

## 2016-06-25 DIAGNOSIS — G8929 Other chronic pain: Secondary | ICD-10-CM | POA: Diagnosis not present

## 2016-06-25 DIAGNOSIS — M5442 Lumbago with sciatica, left side: Secondary | ICD-10-CM

## 2016-06-25 MED ORDER — ROPIVACAINE HCL 2 MG/ML IJ SOLN: 9.0000 mL | Freq: Once | INTRAMUSCULAR | Status: DC

## 2016-06-25 MED ORDER — ROPIVACAINE HCL 2 MG/ML IJ SOLN
INTRAMUSCULAR | Status: AC
  Administered 2016-06-25: 9 mL
  Filled 2016-06-25: qty 10

## 2016-06-25 MED ORDER — FENTANYL CITRATE (PF) 100 MCG/2ML IJ SOLN
25.0000 ug | INTRAMUSCULAR | Status: AC | PRN
  Administered 2016-06-25 (×2): 50 ug via INTRAVENOUS

## 2016-06-25 MED ORDER — LACTATED RINGERS IV SOLN
1000.0000 mL | Freq: Once | INTRAVENOUS | Status: AC
  Administered 2016-06-25: 1000 mL via INTRAVENOUS

## 2016-06-25 MED ORDER — TRIAMCINOLONE ACETONIDE 40 MG/ML IJ SUSP: 40.0000 mg | Freq: Once | INTRAMUSCULAR | Status: DC

## 2016-06-25 MED ORDER — TRIAMCINOLONE ACETONIDE 40 MG/ML IJ SUSP
INTRAMUSCULAR | Status: AC
  Administered 2016-06-25: 40 mg
  Filled 2016-06-25: qty 1

## 2016-06-25 MED ORDER — MIDAZOLAM HCL 5 MG/5ML IJ SOLN
INTRAMUSCULAR | Status: AC
  Administered 2016-06-25: 2 mg via INTRAVENOUS
  Filled 2016-06-25: qty 5

## 2016-06-25 MED ORDER — MIDAZOLAM HCL 5 MG/5ML IJ SOLN
1.0000 mg | INTRAMUSCULAR | Status: DC | PRN
  Administered 2016-06-25 (×2): 2 mg via INTRAVENOUS

## 2016-06-25 MED ORDER — LIDOCAINE HCL (PF) 1 % IJ SOLN: 10.0000 mL | Freq: Once | INTRAMUSCULAR | Status: DC

## 2016-06-25 MED ORDER — FENTANYL CITRATE (PF) 100 MCG/2ML IJ SOLN
INTRAMUSCULAR | Status: AC
  Administered 2016-06-25: 50 ug via INTRAVENOUS
  Filled 2016-06-25: qty 2

## 2016-06-25 NOTE — Progress Notes (Addendum)
Patient's Name: Howard Murphy  MRN: [id number]  Referring Provider: Consuella Lose, MD  DOB: 11-Jul-1955  PCP: No primary care provider on file.  DOS: 06/25/2016  Note by: Kathlen Brunswick. Dossie Arbour, MD  Service setting: Ambulatory outpatient  Location: [facility] (AMB) Pain Management Facility  Visit type: Procedure  Specialty: Interventional Pain Management  Patient type: Established   Primary Reason for Visit: Interventional Pain Management Treatment. CC: Back Pain (left, lower)  Procedure:  Anesthesia, Analgesia, Anxiolysis:  Type: Diagnostic Medial Branch Facet Block Region: Lumbar Level: L2, L3, L4, L5, & S1 Medial Branch Level(s) Laterality: Left  Type: Local Anesthesia with Moderate (Conscious) Sedation Local Anesthetic: Lidocaine 1% Route: Intravenous (IV) IV Access: Secured Sedation: Meaningful verbal contact was maintained at all times during the procedure  Indication(s): Analgesia and Anxiety  Indications: 1. Chronic left-sided low back pain with left-sided sciatica    Pain Score: Pre-procedure: 10-Worst pain ever/10 Post-procedure: 0-No pain/10  Pre-op Assessment:  Previous date of service:   Service provided:   Howard Murphy is a 61 y.o. (year old), male patient, seen today for interventional treatment. He  has a past surgical history that includes Cholecystectomy (2000); Back surgery (2015); Shoulder surgery (Right, 2016); Colonoscopy with propofol (N/A, 04/11/2015); Esophagogastroduodenoscopy (egd) with propofol (N/A, 04/11/2015); and Shoulder surgery (Left, 06/2015). His primarily concern today is the Back Pain (left, lower) This patient was last seen by me on 01/05/2016 at which time he had a diagnostic bilateral lumbar facet block and bilateral sacroiliac joint block under fluoroscopic guidance and IV sedation. Unfortunately, the patient did not keep his return appointment and therefore we were unable to fully interpret the results of these diagnostic injections for him. In the  meanwhile he has gone through Lumbar surgery and now returns with left-sided low back pain and left lower extremity pain. He indicates that the surgery did not help and in fact his leg pain is much worse now. He also describes having had multiple falls as a consequence of his symptoms. At this point it is not clear whether the falls are secondary to the pain bringing him down or weakness. I would suggest for his surgeon to consider the possibility of a nerve conduction test to evaluate the difference. Today he comes in referred for a diagnostic lumbar facet block. His primary pain today is in the lower extremity on the left side going down through the back of the leg to the level of the ankle but not getting into the foot. This pain pattern would suggest referred pain as opposed to atypical radiculopathy. This is also different from his usual lower extremity pain which used to follow an S1 dermatomal distribution. Blood pressure (!) 153/91, pulse 74, temperature 97.9 F (36.6 C), temperature source Oral, resp. rate 15, height 5\' 7"  (1.702 m), weight 195 lb (88.5 kg), SpO2 95 %.Calculated BMI: Body mass index is 30.54 kg/m. Risk Assessment: Allergies: Reviewed. He has No Known Allergies. Allergy Precautions: None required Coagulopathies: "Reviewed. None identified. Blood-thinner therapy: None at this time Active Infection(s): Reviewed. None identified. Howard Murphy is afebrile Site Confirmation: Mr. Lampi was asked to confirm the procedure and laterality before marking the site Procedure checklist: Completed Consent: Before the procedure and under the influence of no sedative(s), amnesic(s), or anxiolytics, the patient was informed of the treatment options, risks and possible complications. To fulfill our ethical and legal obligations, as recommended by the American Medical Association's Code of Ethics, I have informed the patient of my clinical impression; the nature and purpose  of the treatment or  procedure; the risks, benefits, and possible complications of the intervention; the alternatives, including doing nothing; the risk(s) and benefit(s) of the alternative treatment(s) or procedure(s); and the risk(s) and benefit(s) of doing nothing. The patient was provided information about the general risks and possible complications associated with the procedure. These may include, but are not limited to: failure to achieve desired goals, infection, bleeding, organ or nerve damage, allergic reactions, paralysis, and death. In addition, the patient was informed of those risks and complications associated to Spine-related procedures, such as failure to decrease pain; infection (i.e.: Meningitis, epidural or intraspinal abscess); bleeding (i.e.: epidural hematoma, subarachnoid hemorrhage, or any other type of intraspinal or peri-dural bleeding); organ or nerve damage (i.e.: Any type of peripheral nerve, nerve root, or spinal cord injury) with subsequent damage to sensory, motor, and/or autonomic systems, resulting in permanent pain, numbness, and/or weakness of one or several areas of the body; allergic reactions; (i.e.: anaphylactic reaction); and/or death. Furthermore, the patient was informed of those risks and complications associated with the medications. These include, but are not limited to: allergic reactions (i.e.: anaphylactic or anaphylactoid reaction(s)); adrenal axis suppression; blood sugar elevation that in diabetics may result in ketoacidosis or comma; water retention that in patients with history of congestive heart failure may result in shortness of breath, pulmonary edema, and decompensation with resultant heart failure; weight gain; swelling or edema; medication-induced neural toxicity; particulate matter embolism and blood vessel occlusion with resultant organ, and/or nervous system infarction; and/or aseptic necrosis of one or more joints. Finally, the patient was informed that Medicine is  not an exact science; therefore, there is also the possibility of unforeseen or unpredictable risks and/or possible complications that may result in a catastrophic outcome. The patient indicated having understood very clearly. We have given the patient no guarantees and we have made no promises. Enough time was given to the patient to ask questions, all of which were answered to the patient's satisfaction. Mr. Bohanan has indicated that he wanted to continue with the procedure. Attestation: I, the ordering provider, attest that I have discussed with the patient the benefits, risks, side-effects, alternatives, likelihood of achieving goals, and potential problems during recovery for the procedure that I have provided informed consent. Date: 06/25/2016; Time: 8:50 AM  Pre-Procedure Preparation:  Monitoring: As per clinic protocol. Respiration, ETCO2, SpO2, BP, heart rate and rhythm monitor placed and checked for adequate function Safety Precautions: Patient was assessed for positional comfort and pressure points before starting the procedure. Time-out: I initiated and conducted the "Time-out" before starting the procedure, as per protocol. The patient was asked to participate by confirming the accuracy of the "Time Out" information. Verification of the correct person, site, and procedure were performed and confirmed by me, the nursing staff, and the patient. "Time-out" conducted as per Joint Commission's Universal Protocol (UP.01.01.01). Date: 06/25/2016; Time: 0927 hrs.  Description of Procedure Process:   Position: Prone Target Area: For Lumbar Facet blocks, the target is the groove formed by the junction of the transverse process and superior articular process. For the L5 dorsal ramus, the target is the notch between superior articular process and sacral ala. For the S1 dorsal ramus, the target is the superior and lateral edge of the posterior S1 Sacral foramen. Approach: Paramedial approach. Area  Prepped: Entire Posterior Lumbosacral Region Prepping solution: ChloraPrep (2% chlorhexidine gluconate and 70% isopropyl alcohol) Safety Precautions: Aspiration looking for blood return was conducted prior to all injections. At no point did we  inject any substances, as a needle was being advanced. No attempts were made at seeking any paresthesias. Safe injection practices and needle disposal techniques used. Medications properly checked for expiration dates. SDV (single dose vial) medications used. Description of the Procedure: Protocol guidelines were followed. The patient was placed in position over the fluoroscopy table. The target area was identified and the area prepped in the usual manner. Skin desensitized using vapocoolant spray. Skin & deeper tissues infiltrated with local anesthetic. Appropriate amount of time allowed to pass for local anesthetics to take effect. The procedure needle was introduced through the skin, ipsilateral to the reported pain, and advanced to the target area. Employing the "Medial Branch Technique", the needles were advanced to the angle made by the superior and medial portion of the transverse process, and the lateral and inferior portion of the superior articulating process of the targeted vertebral bodies. This area is known as "Burton's Eye" or the "Eye of the [place] Dog". A procedure needle was introduced through the skin, and this time advanced to the angle made by the superior and medial border of the sacral ala, and the lateral border of the S1 vertebral body. This last needle was later repositioned at the superior and lateral border of the posterior S1 foramen. Negative aspiration confirmed. Solution injected in intermittent fashion, asking for systemic symptoms every 0.5cc of injectate. The needles were then removed and the area cleansed, making sure to leave some of the prepping solution back to take advantage of its long term bactericidal  properties.   Illustration of the posterior view of the lumbar spine and the posterior neural structures. Laminae of L2 through S1 are labeled. DPRL5, dorsal primary ramus of L5; DPRS1, dorsal primary ramus of S1; DPR3, dorsal primary ramus of L3; FJ, facet (zygapophyseal) joint L3-L4; I, inferior articular process of L4; LB1, lateral branch of dorsal primary ramus of L1; IAB, inferior articular branches from L3 medial branch (supplies L4-L5 facet joint); IBP, intermediate branch plexus; MB3, medial branch of dorsal primary ramus of L3; NR3, third lumbar nerve root; S, superior articular process of L5; SAB, superior articular branches from L4 (supplies L4-5 facet joint also); TP3, transverse process of L3.  Materials:  Needle(s) Type: Spinal needle(s) Gauge: 22G Length: 3.5-in Medication(s): We administered fentaNYL, lactated ringers, midazolam, ropivacaine (PF) 2 mg/mL (0.2%), fentaNYL, triamcinolone acetonide, and midazolam. Please see chart orders for dosing details.  Imaging Guidance (Spinal):  Type of Imaging Technique: Fluoroscopy Guidance (Spinal) Indication(s): Assistance in needle guidance and placement for procedures requiring needle placement in or near specific anatomical locations not easily accessible without such assistance. Exposure Time: Please see nurses notes. Contrast: None used. Fluoroscopic Guidance: I was personally present during the use of fluoroscopy. "Tunnel Vision Technique" used to obtain the best possible view of the target area. Parallax error corrected before commencing the procedure. "Direction-depth-direction" technique used to introduce the needle under continuous pulsed fluoroscopy. Once target was reached, antero-posterior, oblique, and lateral fluoroscopic projection used confirm needle placement in all planes. Images permanently stored in EMR. Interpretation: No contrast injected. I personally interpreted the imaging intraoperatively. Adequate needle placement  confirmed in multiple planes. Permanent images saved into the patient's record. Fluoroscopy images show no lumbar hardware in the form of 4 pedicle screws in the lower portion of the lumbar spine. The patient has 6 lumbar type vertebral bodies. The last lumbar vertebral body before the sacrum shows bilateral spatulated transverse processes with evidence of bilateral pseudoarthrosis between the transverse process and the upper  ala of the sacrum. This could be a source of pain.  Antibiotic Prophylaxis:  Indication(s): None identified Antibiotic given: None Post-operative Assessment:  EBL: None Complications: No immediate post-treatment complications observed by team, or reported by patient. Note: The patient tolerated the entire procedure well. A repeat set of vitals were taken after the procedure and the patient was kept under observation following institutional policy, for this type of procedure. Post-procedural neurological assessment was performed, showing return to baseline, prior to discharge. The patient was provided with post-procedure discharge instructions, including a section on how to identify potential problems. Should any problems arise concerning this procedure, the patient was given instructions to immediately contact us, at any time, without hesitation. In any case, we plan to contact the patient by telephone for a follow-up status report regarding this interventional procedure. Comments:  No additional relevant information.  Plan of Care  Disposition: Discharge home Discharge Date: 06/25/2016; Discharge Time: 1013 hrs Physician-requested Follow-up:  Return in about 2 weeks (around 07/09/2016) for Post-Procedure evaluation.  Future Appointments Date Time Provider [place]  07/09/2016 1:15 PM Milinda Pointer, MD [facility] None   Medications ordered for procedure: Meds ordered this encounter  Medications  . fentaNYL (SUBLIMAZE) injection 25-50 mcg    Make sure Narcan is  available in the pyxis when using this medication. In the event of respiratory depression (RR< 8/min): Titrate NARCAN (naloxone) in increments of 0.1 to 0.2 mg IV at 2-3 minute intervals, until desired degree of reversal.  . lactated ringers infusion 1,000 mL  . midazolam (VERSED) 5 MG/5ML injection 1-2 mg    Make sure Flumazenil is available in the pyxis when using this medication. If oversedation occurs, administer 0.2 mg IV over 15 sec. If after 45 sec no response, administer 0.2 mg again over 1 min; may repeat at 1 min intervals; not to exceed 4 doses (1 mg)  . triamcinolone acetonide (KENALOG-40) injection 40 mg  . lidocaine (PF) (XYLOCAINE) 1 % injection 10 mL  . ropivacaine (PF) 2 mg/mL (0.2%) (NAROPIN) injection 9 mL  . ropivacaine (PF) 2 mg/mL (0.2%) (NAROPIN) 2 MG/ML injection    [place]ne [place], Patti: cabinet override  . fentaNYL (SUBLIMAZE) 100 MCG/2ML injection    [place]ne [place], Patti: cabinet override  . triamcinolone acetonide (KENALOG-40) 40 MG/ML injection    [place]ne [place], Patti: cabinet override  . midazolam (VERSED) 5 MG/5ML injection    [place]ne [place], Patti: cabinet override   Medications administered: We administered fentaNYL, lactated ringers, midazolam, ropivacaine (PF) 2 mg/mL (0.2%), fentaNYL, triamcinolone acetonide, and midazolam.  See the medical record for exact dosing, route, and time of administration.  Lab-work, Procedure(s), & Referral(s) Ordered: Orders Placed This Encounter  Procedures  . LUMBAR FACET(MEDIAL BRANCH NERVE BLOCK) MBNB  . DG C-Arm 1-60 Min-No Report  . Discharge instructions  . Follow-up  . Informed Consent Details: Transcribe to consent form and obtain patient signature  . Provider attestation of informed consent for procedure/surgical case  . Verify informed consent   Imaging Ordered: No results found for this or any previous visit. New Prescriptions   No medications on file    Primary Care Physician: No primary care provider on file. Location: [place] Note by: Roark Rufo A. Dossie Arbour, M.D, DABA, DABAPM, DABPM, DABIPP, FIPP Date: 06/25/2016; Time: 1:46 PM  Disclaimer:  Medicine is not an Chief Strategy Officer. The only guarantee in medicine is that nothing is guaranteed. It is important to note that the decision to proceed with this intervention was based on the information  collected from the patient. The Data and conclusions were drawn from the patient's questionnaire, the interview, and the physical examination. Because the information was provided in large part by the patient, it cannot be guaranteed that it has not been purposely or unconsciously manipulated. Every effort has been made to obtain as much relevant data as possible for this evaluation. It is important to note that the conclusions that lead to this procedure are derived in large part from the available data. Always take into account that the treatment will also be dependent on availability of resources and existing treatment guidelines, considered by other Pain Management Practitioners as being common knowledge and practice, at the time of the intervention. For Medico-Legal purposes, it is also important to point out that variation in procedural techniques and pharmacological choices are the acceptable norm. The indications, contraindications, technique, and results of the above procedure should only be interpreted and judged by a Board-Certified Interventional Pain Specialist with extensive familiarity and expertise in the same exact procedure and technique. Attempts at providing opinions without similar or greater experience and expertise than that of the treating physician will be considered as inappropriate and unethical, and shall result in a formal complaint to the state medical board and applicable specialty societies.  Instructions provided at this appointment: Patient Instructions  Facet Blocks Patient Information  Description: The facets are joints in  the spine between the vertebrae.  Like any joints in the body, facets can become irritated and painful.  Arthritis can also effect the facets.  By injecting steroids and local anesthetic in and around these joints, we can temporarily block the nerve supply to them.  Steroids act directly on irritated nerves and tissues to reduce selling and inflammation which often leads to decreased pain.  Facet blocks may be done anywhere along the spine from the neck to the low back depending upon the location of your pain.   After numbing the skin with local anesthetic (like Novocaine), a small needle is passed onto the facet joints under x-ray guidance.  You may experience a sensation of pressure while this is being done.  The entire block usually lasts about 15-25 minutes.   Conditions which may be treated by facet blocks:   Low back/buttock pain  Neck/shoulder pain  Certain types of headaches  Preparation for the injection:  1. Do not eat any solid food or dairy products within 8 hours of your appointment. 2. You may drink clear liquid up to 3 hours before appointment.  Clear liquids include water, black coffee, juice or soda.  No milk or cream please. 3. You may take your regular medication, including pain medications, with a sip of water before your appointment.  Diabetics should hold regular insulin (if taken separately) and take 1/2 normal NPH dose the morning of the procedure.  Carry some sugar containing items with you to your appointment. 4. A driver must accompany you and be prepared to drive you home after your procedure. 5. Bring all your current medications with you. 6. An IV may be inserted and sedation may be given at the discretion of the physician. 7. A blood pressure cuff, EKG and other monitors will often be applied during the procedure.  Some patients may need to have extra oxygen administered for a short period. 8. You will be asked to provide medical information, including your  allergies and medications, prior to the procedure.  We must know immediately if you are taking blood thinners (like Coumadin/Warfarin) or if you are  allergic to IV iodine contrast (dye).  We must know if you could possible be pregnant.  Possible side-effects:   Bleeding from needle site  Infection (rare, may require surgery)  Nerve injury (rare)  Numbness & tingling (temporary)  Difficulty urinating (rare, temporary)  Spinal headache (a headache worse with upright posture)  Light-headedness (temporary)  Pain at injection site (serveral days)  Decreased blood pressure (rare, temporary)  Weakness in arm/leg (temporary)  Pressure sensation in back/neck (temporary)   Call if you experience:   Fever/chills associated with headache or increased back/neck pain  Headache worsened by an upright position  New onset, weakness or numbness of an extremity below the injection site  Hives or difficulty breathing (go to the emergency room)  Inflammation or drainage at the injection site(s)  Severe back/neck pain greater than usual  New symptoms which are concerning to you  Please note:  Although the local anesthetic injected can often make your back or neck feel good for several hours after the injection, the pain will likely return. It takes 3-7 days for steroids to work.  You may not notice any pain relief for at least one week.  If effective, we will often do a series of 2-3 injections spaced 3-6 weeks apart to maximally decrease your pain.  After the initial series, you may be a candidate for a more permanent nerve block of the facets.  If you have any questions, please call #[phone]) [facility]  Pain Management Discharge Instructions  General Discharge Instructions :  If you need to reach your doctor call: Monday-Friday 8:00 am - 4:00 pm at [phone] or toll free [phone].  After clinic hours [phone] to have operator  reach doctor.  Bring all of your medication bottles to all your appointments in the pain clinic.  To cancel or reschedule your appointment with Pain Management please remember to call 24 hours in advance to avoid a fee.  Refer to the educational materials which you have been given on: General Risks, I had my Procedure. Discharge Instructions, Post Sedation.  Post Procedure Instructions:  The drugs you were given will stay in your system until tomorrow, so for the next 24 hours you should not drive, make any legal decisions or drink any alcoholic beverages.  You may eat anything you prefer, but it is better to start with liquids then soups and crackers, and gradually work up to solid foods.  Please notify your doctor immediately if you have any unusual bleeding, trouble breathing or pain that is not related to your normal pain.  Depending on the type of procedure that was done, some parts of your body may feel week and/or numb.  This usually clears up by tonight or the next day.  Walk with the use of an assistive device or accompanied by an adult for the 24 hours.  You may use ice on the affected area for the first 24 hours.  Put ice in a Ziploc bag and cover with a towel and place against area 15 minutes on 15 minutes off.  You may switch to heat after 24 hours.  IMPORTANT: Please fill out the post procedure pain diary and bring it with you at your next appointment.

## 2016-06-25 NOTE — Patient Instructions (Signed)
Facet Blocks Patient Information  Description: The facets are joints in the spine between the vertebrae.  Like any joints in the body, facets can become irritated and painful.  Arthritis can also effect the facets.  By injecting steroids and local anesthetic in and around these joints, we can temporarily block the nerve supply to them.  Steroids act directly on irritated nerves and tissues to reduce selling and inflammation which often leads to decreased pain.  Facet blocks may be done anywhere along the spine from the neck to the low back depending upon the location of your pain.   After numbing the skin with local anesthetic (like Novocaine), a small needle is passed onto the facet joints under x-ray guidance.  You may experience a sensation of pressure while this is being done.  The entire block usually lasts about 15-25 minutes.   Conditions which may be treated by facet blocks:   Low back/buttock pain  Neck/shoulder pain  Certain types of headaches  Preparation for the injection:  1. Do not eat any solid food or dairy products within 8 hours of your appointment. 2. You may drink clear liquid up to 3 hours before appointment.  Clear liquids include water, black coffee, juice or soda.  No milk or cream please. 3. You may take your regular medication, including pain medications, with a sip of water before your appointment.  Diabetics should hold regular insulin (if taken separately) and take 1/2 normal NPH dose the morning of the procedure.  Carry some sugar containing items with you to your appointment. 4. A driver must accompany you and be prepared to drive you home after your procedure. 5. Bring all your current medications with you. 6. An IV may be inserted and sedation may be given at the discretion of the physician. 7. A blood pressure cuff, EKG and other monitors will often be applied during the procedure.  Some patients may need to have extra oxygen administered for a short  period. 8. You will be asked to provide medical information, including your allergies and medications, prior to the procedure.  We must know immediately if you are taking blood thinners (like Coumadin/Warfarin) or if you are allergic to IV iodine contrast (dye).  We must know if you could possible be pregnant.  Possible side-effects:   Bleeding from needle site  Infection (rare, may require surgery)  Nerve injury (rare)  Numbness & tingling (temporary)  Difficulty urinating (rare, temporary)  Spinal headache (a headache worse with upright posture)  Light-headedness (temporary)  Pain at injection site (serveral days)  Decreased blood pressure (rare, temporary)  Weakness in arm/leg (temporary)  Pressure sensation in back/neck (temporary)   Call if you experience:   Fever/chills associated with headache or increased back/neck pain  Headache worsened by an upright position  New onset, weakness or numbness of an extremity below the injection site  Hives or difficulty breathing (go to the emergency room)  Inflammation or drainage at the injection site(s)  Severe back/neck pain greater than usual  New symptoms which are concerning to you  Please note:  Although the local anesthetic injected can often make your back or neck feel good for several hours after the injection, the pain will likely return. It takes 3-7 days for steroids to work.  You may not notice any pain relief for at least one week.  If effective, we will often do a series of 2-3 injections spaced 3-6 weeks apart to maximally decrease your pain.  After the initial   series, you may be a candidate for a more permanent nerve block of the facets.  If you have any questions, please call [phone] [facility]  Pain Management Discharge Instructions  General Discharge Instructions :  If you need to reach your doctor call: Monday-Friday 8:00 am - 4:00 pm at [phone] or  toll free [phone].  After clinic hours [phone] to have operator reach doctor.  Bring all of your medication bottles to all your appointments in the pain clinic.  To cancel or reschedule your appointment with Pain Management please remember to call 24 hours in advance to avoid a fee.  Refer to the educational materials which you have been given on: General Risks, I had my Procedure. Discharge Instructions, Post Sedation.  Post Procedure Instructions:  The drugs you were given will stay in your system until tomorrow, so for the next 24 hours you should not drive, make any legal decisions or drink any alcoholic beverages.  You may eat anything you prefer, but it is better to start with liquids then soups and crackers, and gradually work up to solid foods.  Please notify your doctor immediately if you have any unusual bleeding, trouble breathing or pain that is not related to your normal pain.  Depending on the type of procedure that was done, some parts of your body may feel week and/or numb.  This usually clears up by tonight or the next day.  Walk with the use of an assistive device or accompanied by an adult for the 24 hours.  You may use ice on the affected area for the first 24 hours.  Put ice in a Ziploc bag and cover with a towel and place against area 15 minutes on 15 minutes off.  You may switch to heat after 24 hours.  IMPORTANT: Please fill out the post procedure pain diary and bring it with you at your next appointment. 

## 2016-06-25 NOTE — Progress Notes (Signed)
Safety precautions to be maintained throughout the outpatient stay will include: orient to surroundings, keep bed in low position, maintain call bell within reach at all times, provide assistance with transfer out of bed and ambulation.  

## 2016-06-26 ENCOUNTER — Telehealth: Payer: Self-pay

## 2016-06-26 NOTE — Telephone Encounter (Signed)
No answer- Pt not available and unable to leave message

## 2016-07-09 ENCOUNTER — Ambulatory Visit: Payer: Medicare Other | Admitting: Pain Medicine

## 2016-07-09 DIAGNOSIS — G894 Chronic pain syndrome: Secondary | ICD-10-CM | POA: Insufficient documentation

## 2016-07-09 NOTE — Progress Notes (Deleted)
Patient's Name: Howard Murphy  MRN: [id number]  Referring Provider: No ref. provider found  DOB: 02-29-1956  PCP: No primary care provider on file.  DOS: 07/09/2016  Note by: Kathlen Brunswick. Dossie Arbour, MD  Service setting: Ambulatory outpatient  Specialty: Interventional Pain Management  Location: [facility] (AMB) Pain Management Facility    Patient type: Established   Primary Reason(s) for Visit: Encounter for post-procedure evaluation of chronic illness with mild to moderate exacerbation CC: No chief complaint on file.  HPI  Howard Murphy is a 61 y.o. year old, male patient, who comes today for a post-procedure evaluation. He has Benign fibroma of prostate; Elevated hemoglobin ([place]); Acid reflux; BP (high blood pressure); Hypercholesteremia; History of lumbar facet Synovial cyst, surgically removed.; Breath shortness; Chronic abdominal pain (RUQ); Type 2 diabetes mellitus with peripheral neuropathy ([place]); Chest pain; Hypertriglyceridemia; Opiate use (60 MME/Day); Long term prescription opiate use; Long term current use of opiate analgesic; Chronic low back pain (Bilateral) (L>R); Lumbar spondylosis; Lumbar facet syndrome (Bilateral) (L>R); Chronic hip pain (Left); Diabetic peripheral neuropathy ([place]); Chronic lumbar radicular pain (S1 Dermatome) (Bilateral) (L>R); Hypokalemia; Complete tear of the shoulder rotator cuff (Left); Chronic shoulder pain (Location of Primary Source of Pain) (Bilateral) (L>R); Non-insulin dependent type 2 diabetes mellitus ([place]); Chronic foot pain (Location of Secondary source of pain) (Bilateral) (L>R); Chronic hand pain (Location of Tertiary source of pain) (Bilateral) (R>L); Lumbar facet hypertrophy; Encounter for therapeutic drug level monitoring; Encounter for pain management planning; Low testosterone; Occipital pain (Right); Failed back surgical syndrome 2; Chronic left-sided low back pain with left-sided sciatica; Radicular pain of shoulder (Right); Carpal tunnel syndrome  (Bilateral); Chronic cervical radicular pain (Right); Abnormal MRI, lumbar spine; Chronic sacroiliac joint pain (Bilateral) (L>R); Neurogenic pain; Cervical spondylosis; Cervical facet arthropathy; Chronic neck pain (Bilateral) (R>L); Cervical facet syndrome (Bilateral) (R>L); Chronic upper extremity pain (Bilateral) (R>L); Cervical foraminal stenosis;  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 His primarily concern today is the No chief complaint on file.  Pain Assessment: Self-Reported Pain Score:  /10             Reported level is compatible with observation.          Howard Murphy comes in today for post-procedure evaluation after the treatment done on 06/25/2016. At this time we will stop prescribing any further opioids since the patient has been obtaining them from [facility], [facility] C MD.  Further details on both, my assessment(s), as well as the proposed treatment plan, please see below.  Post-Procedure Assessment  06/25/2016 Procedure: Diagnostic left lumbar facet block under fluoroscopic guidance and IV sedation Post-procedure pain score: 0/10 (100% relief) Influential Factors: BMI:   Intra-procedural challenges: None observed Assessment challenges: None detected         Post-procedural side-effects, adverse reactions, or complications: None reported Reported issues: None  Sedation: Sedation provided. When no sedatives are used, the analgesic levels obtained are directly associated to the effectiveness of the local anesthetics. However, when sedation is provided, the level of analgesia obtained during the initial 1 hour following the  intervention, is believed to be the result of a combination of factors. These factors may include, but are not limited to: 1. The effectiveness of the local  anesthetics used. 2. The effects of the analgesic(s) and/or anxiolytic(s) used. 3. The degree of discomfort experienced by the patient at the time of the procedure. 4. The patients ability and reliability in recalling and recording the events. 5. The presence and influence of possible secondary gains and/or psychosocial factors. Reported result: Relief experienced during the 1st hour after the procedure:   (Ultra-Short Term Relief) Interpretative annotation: Analgesia during this period is likely to be Local Anesthetic and/or IV Sedative (Analgesic/Anxiolitic) related.          Effects of local anesthetic: The analgesic effects attained during this period are directly associated to the localized infiltration of local anesthetics and therefore cary significant diagnostic value as to the etiological location, or anatomical origin, of the pain. Expected duration of relief is directly dependent on the pharmacodynamics of the local anesthetic used. Long-acting (4-6 hours) anesthetics used.  Reported result: Relief during the next 4 to 6 hour after the procedure:   (Short-Term Relief) Interpretative annotation: Complete relief would suggest area to be the source of the pain.          Long-term benefit: Defined as the period of time past the expected duration of local anesthetics. With the possible exception of prolonged sympathetic blockade from the local anesthetics, benefits during this period are typically attributed to, or associated with, other factors such as analgesic sensory neuropraxia, antiinflammatory effects, or beneficial biochemical changes provided by agents other than the local anesthetics Reported result: Extended relief following procedure:   (Long-Term Relief) Interpretative annotation: Good relief. This could suggest inflammation to  be a significant component in the etiology to the pain.          Current benefits: Defined as persistent relief that continues at this point in time.   Reported results: Treated area: *** %       Interpretative annotation: Ongoing benefits would suggest effective therapeutic approach  Interpretation: Results would suggest a successful diagnostic intervention.          Laboratory Chemistry  Inflammation Markers Lab Results  Component Value Date   ESRSEDRATE 1 09/05/2015   CRP 0.6 09/05/2015   Renal Function Lab Results  Component Value Date   BUN 12 02/24/2016   CREATININE 1.00 05/01/2016   GFRAA >89 02/24/2016   GFRNONAA 87 02/24/2016   Hepatic Function Lab Results  Component Value Date   AST 16 02/24/2016   ALT 23 02/24/2016   ALBUMIN 4.2 02/24/2016   Electrolytes Lab Results  Component Value Date   NA 140 02/24/2016   K 4.3 02/24/2016   CL 103 02/24/2016   CALCIUM 9.4 02/24/2016   MG 2.1 09/05/2015   Pain Modulating Vitamins No results found for: Maralyn Sago [id number], [id number], [id number], 25OHVITD1, 25OHVITD2, 25OHVITD3, VITAMINB12 Coagulation Parameters Lab Results  Component Value Date   PLT 187 01/25/2016   Cardiovascular Lab Results  Component Value Date   HGB 15.4 01/25/2016   HCT 47.6 01/25/2016   Note: Lab results reviewed.  Recent Diagnostic Imaging Review  Dg C-arm 1-60 Min-no Report  Result Date: 06/25/2016 There is no Radiologist interpretation  for this exam.  Note: Imaging results reviewed.          Meds  The patient has a current medication list which includes the following prescription(s): amlodipine, chlorthalidone, lisinopril, omeprazole, oxycodone-acetaminophen, rosuvastatin, sitagliptin, and testosterone cypionate.  Current Outpatient Prescriptions on File Prior to Visit  Medication Sig  . amLODipine (NORVASC) 5 MG tablet Take 1 tablet (5 mg total) by mouth daily.  Marland Kitchen  chlorthalidone (HYGROTON) 50 MG tablet 50 mg daily.   Marland Kitchen  lisinopril (PRINIVIL,ZESTRIL) 20 MG tablet Take 1 tablet (20 mg total) by mouth daily. Reported on 12/08/2015  . omeprazole (PRILOSEC) 40 MG capsule Take 40 mg by mouth 2 (two) times daily.  Marland Kitchen oxyCODONE-acetaminophen (PERCOCET) 10-325 MG tablet Take 1 tablet by mouth every 4 (four) hours as needed for pain.  . rosuvastatin (CRESTOR) 40 MG tablet Take 1 tablet (40 mg total) by mouth at bedtime.  . sitaGLIPtin (JANUVIA) 50 MG tablet Take 1 tablet (50 mg total) by mouth daily.  Marland Kitchen testosterone cypionate (DEPOTESTOSTERONE CYPIONATE) 200 MG/ML injection Inject 200 mg into the muscle every 14 (fourteen) days.   No current facility-administered medications on file prior to visit.    ROS  Constitutional: Denies any fever or chills Gastrointestinal: No reported hemesis, hematochezia, vomiting, or acute GI distress Musculoskeletal: Denies any acute onset joint swelling, redness, loss of ROM, or weakness Neurological: No reported episodes of acute onset apraxia, aphasia, dysarthria, agnosia, amnesia, paralysis, loss of coordination, or loss of consciousness  Allergies  Mr. Kingery has No Known Allergies.  [place]  Drug: Mr. Keath  reports that he does not use drugs. Alcohol:  reports that he does not drink alcohol. Tobacco:  reports that he has never smoked. He quit smokeless tobacco use about 3 years ago. His smokeless tobacco use included Chew. Medical:  has a past medical history of Arthritis; Back ache (05/05/2013); Diabetes mellitus without complication ([place]); GERD (gastroesophageal reflux disease); Hyperlipemia; Hypertension; Kidney stones; Nephrolithiasis; and Shortness of breath dyspnea. Family: family history includes Cancer in his maternal grandmother, mother, sister, and sister; Heart disease in his father; Stroke in his father.  Past Surgical History:  Procedure Laterality Date  . BACK SURGERY  2015  . CHOLECYSTECTOMY  2000  . COLONOSCOPY WITH PROPOFOL N/A 04/11/2015   Procedure:  COLONOSCOPY WITH PROPOFOL;  Surgeon: Manya Silvas, MD;  [facility] ENDOSCOPY;  Service: Endoscopy;  Laterality: N/A;  . ESOPHAGOGASTRODUODENOSCOPY (EGD) WITH PROPOFOL N/A 04/11/2015   Procedure: ESOPHAGOGASTRODUODENOSCOPY (EGD) WITH PROPOFOL;  Surgeon: Manya Silvas, MD;  Location: [facility] ENDOSCOPY;  Service: Endoscopy;  Laterality: N/A;  . SHOULDER SURGERY Right 2016  . SHOULDER SURGERY Left 06/2015   Constitutional Exam  General appearance: Well nourished, well developed, and well hydrated. In no apparent acute distress There were no vitals filed for this visit. BMI Assessment: Estimated body mass index is 30.54 kg/m as calculated from the following:   Height as of 06/25/16: 5\' 7"  (1.702 m).   Weight as of 06/25/16: 195 lb (88.5 kg).  BMI interpretation table: BMI level Category Range association with higher incidence of chronic pain  <18 kg/m2 Underweight   18.5-24.9 kg/m2 Ideal body weight   25-29.9 kg/m2 Overweight Increased incidence by 20%  30-34.9 kg/m2 Obese (Class I) Increased incidence by 68%  35-39.9 kg/m2 Severe obesity (Class II) Increased incidence by 136%  >40 kg/m2 Extreme obesity (Class III) Increased incidence by 254%   BMI Readings from Last 4 Encounters:  06/25/16 30.54 kg/m  02/24/16 28.89 kg/m  01/24/16 29.54 kg/m  01/19/16 29.63 kg/m   Wt Readings from Last 4 Encounters:  06/25/16 195 lb (88.5 kg)  02/24/16 179 lb (81.2 kg)  01/24/16 183 lb (83 kg)  01/19/16 183 lb 9.6 oz (83.3 kg)  Psych/Mental status: Alert, oriented x 3 (person, place, & time)       Eyes: PERLA Respiratory: No evidence of acute respiratory distress  Cervical Spine Exam  Inspection:  No masses, redness, or swelling Alignment: Symmetrical Functional ROM: Unrestricted ROM Stability: No instability detected Muscle strength & Tone: Functionally intact Sensory: Unimpaired Palpation: Non-contributory  Upper Extremity (UE) Exam    Side: Right upper extremity  Side: Left  upper extremity  Inspection: No masses, redness, swelling, or asymmetry  Inspection: No masses, redness, swelling, or asymmetry  Functional ROM: Unrestricted ROM          Functional ROM: Unrestricted ROM          Muscle strength & Tone: Functionally intact  Muscle strength & Tone: Functionally intact  Sensory: Unimpaired  Sensory: Unimpaired  Palpation: Non-contributory  Palpation: Non-contributory   Thoracic Spine Exam  Inspection: No masses, redness, or swelling Alignment: Symmetrical Functional ROM: Unrestricted ROM Stability: No instability detected Sensory: Unimpaired Muscle strength & Tone: Functionally intact Palpation: Non-contributory  Lumbar Spine Exam  Inspection: No masses, redness, or swelling Alignment: Symmetrical Functional ROM: Unrestricted ROM Stability: No instability detected Muscle strength & Tone: Functionally intact Sensory: Unimpaired Palpation: Non-contributory Provocative Tests: Lumbar Hyperextension and rotation test: evaluation deferred today       Patrick's Maneuver: evaluation deferred today              Gait & Posture Assessment  Ambulation: Unassisted Gait: Relatively normal for age and body habitus Posture: WNL   Lower Extremity Exam    Side: Right lower extremity  Side: Left lower extremity  Inspection: No masses, redness, swelling, or asymmetry  Inspection: No masses, redness, swelling, or asymmetry  Functional ROM: Unrestricted ROM          Functional ROM: Unrestricted ROM          Muscle strength & Tone: Functionally intact  Muscle strength & Tone: Functionally intact  Sensory: Unimpaired  Sensory: Unimpaired  Palpation: Non-contributory  Palpation: Non-contributory   Assessment  Primary Diagnosis & Pertinent Problem List: The encounter diagnosis was Chronic pain syndrome.  Status Diagnosis  Controlled Controlled Controlled 1. Chronic pain syndrome      Plan of Care  Pharmacotherapy (Medications Ordered): No orders of the  defined types were placed in this encounter.  New Prescriptions   No medications on file   Medications administered today: Mr. Vold had no medications administered during this visit. Lab-work, procedure(s), and/or referral(s): No orders of the defined types were placed in this encounter.  Imaging and/or referral(s): None  Interventional therapies: Planned, scheduled, and/or pending:   Not at this time.   Considering:   ***   Palliative PRN treatment(s):   ***   Provider-requested follow-up: No Follow-up on file.  Future Appointments Date Time Provider [place]  07/09/2016 1:15 PM Milinda Pointer, MD [facility] None   Primary Care Physician: No primary care provider on file. Location: [facility] Note by: Elmina Hendel A. Dossie Arbour, M.D, DABA, DABAPM, DABPM, DABIPP, FIPP Date: 07/09/2016; Time: 10:16 AM  Pain Score Disclaimer: We use the NRS-11 scale. This is a self-reported, subjective measurement of pain severity with only modest accuracy. It is used primarily to identify changes within a particular patient. It must be understood that outpatient pain scales are significantly less accurate that those used for research, where they can be applied under ideal controlled circumstances with minimal exposure to variables. In reality, the score is likely to be a combination of pain intensity and pain affect, where pain affect describes the degree of emotional arousal or changes in action readiness caused by the sensory experience of pain. Factors such as social and work situation, setting, emotional  state, anxiety levels, expectation, and prior pain experience may influence pain perception and show large inter-individual differences that may also be affected by time variables.  Patient instructions provided during this appointment: There are no Patient Instructions on file for this visit.

## 2016-07-12 DIAGNOSIS — M4316 Spondylolisthesis, lumbar region: Secondary | ICD-10-CM | POA: Diagnosis not present

## 2016-07-12 DIAGNOSIS — M5442 Lumbago with sciatica, left side: Secondary | ICD-10-CM | POA: Diagnosis not present

## 2016-07-12 DIAGNOSIS — G8929 Other chronic pain: Secondary | ICD-10-CM | POA: Diagnosis not present

## 2016-07-13 DIAGNOSIS — Z20828 Contact with and (suspected) exposure to other viral communicable diseases: Secondary | ICD-10-CM | POA: Diagnosis not present

## 2016-07-17 ENCOUNTER — Other Ambulatory Visit: Payer: Self-pay | Admitting: Neurosurgery

## 2016-08-01 NOTE — Pre-Procedure Instructions (Signed)
Howard Murphy  08/01/2016      [name] - [name], [name] - [name] [address] Phone: [phone] Fax: [phone]  [facility] [place], [place] AT [address] [place] [address] Phone: [phone] Fax: [phone]    Your procedure is scheduled on Fri, Mar 2 @ 12:15 PM  Report to [facility] Admitting at 10:15 AM  Call this number if you have problems the morning of surgery:  [phone]   Remember:  Do not eat food or drink liquids after midnight.  Take these medicines the morning of surgery with A SIP OF WATER Amlodipine(Norvasc),Omeprazole(Prilosec),and Pain Pill(if needed)               No Goody's,BC's,Aleve,Advil,Motrin,Ibuprofen,Aspirin,Fish Oil,or any Herbal Medications.      Do not wear jewelry.  Do not wear lotions, powders,colognes, or deoderant.  Men may shave face and neck.  Do not bring valuables to the hospital.  [facility] is not responsible for any belongings or valuables.  Contacts, dentures or bridgework may not be worn into surgery.  Leave your suitcase in the car.  After surgery it may be brought to your room.  For patients admitted to the hospital, discharge time will be determined by your treatment team.  Patients discharged the day of surgery will not be allowed to drive home.    [facility]  Before surgery, you can play an important role.  Because skin is not sterile, your skin needs to be as free of germs as possible.  You can reduce the number of germs on you skin by washing with CHG (chlorahexidine gluconate) soap before surgery.  CHG is an antiseptic cleaner which kills germs and bonds with the skin to continue killing germs even after washing.  Please DO NOT use if you have an allergy to CHG or antibacterial soaps.  If your skin becomes reddened/irritated stop using the CHG  and inform your nurse when you arrive at Short Stay.  Do not shave (including legs and underarms) for at least 48 hours prior to the first CHG shower.  You may shave your face.  Please follow these instructions carefully:   1.  Shower with CHG Soap the night before surgery and the                                morning of Surgery.  2.  If you choose to wash your hair, wash your hair first as usual with your       normal shampoo.  3.  After you shampoo, rinse your hair and body thoroughly to remove the                      Shampoo.  4.  Use CHG as you would any other liquid soap.  You can apply chg directly       to the skin and wash gently with scrungie or a clean washcloth.  5.  Apply the CHG Soap to your body ONLY FROM THE NECK DOWN.        Do not use on open wounds or open sores.  Avoid contact with your eyes,       ears, mouth and genitals (private parts).  Wash genitals (private parts)  with your normal soap.  6.  Wash thoroughly, paying special attention to the area where your surgery        will be performed.  7.  Thoroughly rinse your body with warm water from the neck down.  8.  DO NOT shower/wash with your normal soap after using and rinsing off       the CHG Soap.  9.  Pat yourself dry with a clean towel.            10.  Wear clean pajamas.            11.  Place clean sheets on your bed the night of your first shower and do not        sleep with pets.  Day of Surgery  Do not apply any lotions/deoderants the morning of surgery.  Please wear clean clothes to the hospital/surgery center.     Please read over the following fact sheets that you were given. Pain Booklet, Coughing and Deep Breathing, MRSA Information and Surgical Site Infection Prevention

## 2016-08-02 ENCOUNTER — Encounter (HOSPITAL_COMMUNITY)
Admission: RE | Admit: 2016-08-02 | Discharge: 2016-08-02 | Disposition: A | Discharge status: Home / Self Care | Payer: Medicare Other | Source: Ambulatory Visit | Attending: Neurosurgery | Admitting: Neurosurgery

## 2016-08-02 ENCOUNTER — Encounter (HOSPITAL_COMMUNITY): Payer: Self-pay

## 2016-08-02 DIAGNOSIS — M4316 Spondylolisthesis, lumbar region: Secondary | ICD-10-CM | POA: Insufficient documentation

## 2016-08-02 DIAGNOSIS — I10 Essential (primary) hypertension: Secondary | ICD-10-CM

## 2016-08-02 DIAGNOSIS — E119 Type 2 diabetes mellitus without complications: Secondary | ICD-10-CM | POA: Insufficient documentation

## 2016-08-02 DIAGNOSIS — N200 Calculus of kidney: Secondary | ICD-10-CM

## 2016-08-02 DIAGNOSIS — E785 Hyperlipidemia, unspecified: Secondary | ICD-10-CM | POA: Insufficient documentation

## 2016-08-02 DIAGNOSIS — Z01812 Encounter for preprocedural laboratory examination: Secondary | ICD-10-CM

## 2016-08-02 HISTORY — DX: Headache, unspecified: R51.9

## 2016-08-02 HISTORY — DX: Headache: R51

## 2016-08-02 LAB — CBC
HCT: 54.4 % — ABNORMAL HIGH (ref 39.0–52.0)
Hemoglobin: 18.7 g/dL — ABNORMAL HIGH (ref 13.0–17.0)
MCH: 30.2 pg (ref 26.0–34.0)
MCHC: 34.4 g/dL (ref 30.0–36.0)
MCV: 87.9 fL (ref 78.0–100.0)
PLATELETS: 223 10*3/uL (ref 150–400)
RBC: 6.19 MIL/uL — ABNORMAL HIGH (ref 4.22–5.81)
RDW: 14.3 % (ref 11.5–15.5)
WBC: 11.6 10*3/uL — ABNORMAL HIGH (ref 4.0–10.5)

## 2016-08-02 LAB — BASIC METABOLIC PANEL
Anion gap: 9 (ref 5–15)
BUN: 10 mg/dL (ref 6–20)
CO2: 26 mmol/L (ref 22–32)
CREATININE: 1.05 mg/dL (ref 0.61–1.24)
Calcium: 9.7 mg/dL (ref 8.9–10.3)
Chloride: 101 mmol/L (ref 101–111)
GFR calc Af Amer: >60 mL/min (ref >60)
GLUCOSE: 117 mg/dL — AB (ref 65–99)
Potassium: 3.3 mmol/L — ABNORMAL LOW (ref 3.5–5.1)
Sodium: 136 mmol/L (ref 135–145)

## 2016-08-02 LAB — SURGICAL PCR SCREEN
MRSA, PCR: NEGATIVE
Staphylococcus aureus: NEGATIVE

## 2016-08-02 LAB — TYPE AND SCREEN
ABO/RH(D): A POS
Antibody Screen: NEGATIVE

## 2016-08-02 LAB — GLUCOSE, CAPILLARY: Glucose-Capillary: 135 mg/dL — ABNORMAL HIGH (ref 65–99)

## 2016-08-02 MED ORDER — CHLORHEXIDINE GLUCONATE CLOTH 2 % EX PADS: 6.0000 | MEDICATED_PAD | Freq: Once | CUTANEOUS | Status: DC

## 2016-08-02 NOTE — Progress Notes (Signed)
Hemoglobin 18.8, Dr Arvin Collard notified no new orders.

## 2016-08-03 ENCOUNTER — Inpatient Hospital Stay (HOSPITAL_COMMUNITY): Payer: Medicare Other | Admitting: Certified Registered Nurse Anesthetist

## 2016-08-03 ENCOUNTER — Encounter (HOSPITAL_COMMUNITY)
Admission: RE | Disposition: A | Discharge status: Home / Self Care | Payer: Self-pay | Source: Ambulatory Visit | Attending: Neurosurgery

## 2016-08-03 ENCOUNTER — Other Ambulatory Visit (HOSPITAL_COMMUNITY): Payer: Self-pay | Admitting: *Deleted

## 2016-08-03 ENCOUNTER — Encounter (HOSPITAL_COMMUNITY): Payer: Self-pay | Admitting: Certified Registered Nurse Anesthetist

## 2016-08-03 ENCOUNTER — Inpatient Hospital Stay (HOSPITAL_COMMUNITY): Payer: Medicare Other

## 2016-08-03 ENCOUNTER — Inpatient Hospital Stay (HOSPITAL_COMMUNITY)
Admission: RE | Admit: 2016-08-03 | Discharge: 2016-08-07 | DRG: 460 | Disposition: A | Discharge status: Home / Self Care | Payer: Medicare Other | Source: Ambulatory Visit | Attending: Neurosurgery | Admitting: Neurosurgery

## 2016-08-03 DIAGNOSIS — R51 Headache: Secondary | ICD-10-CM

## 2016-08-03 DIAGNOSIS — Z7984 Long term (current) use of oral hypoglycemic drugs: Secondary | ICD-10-CM

## 2016-08-03 DIAGNOSIS — M25512 Pain in left shoulder: Secondary | ICD-10-CM

## 2016-08-03 DIAGNOSIS — I10 Essential (primary) hypertension: Secondary | ICD-10-CM | POA: Diagnosis present

## 2016-08-03 DIAGNOSIS — R0602 Shortness of breath: Secondary | ICD-10-CM

## 2016-08-03 DIAGNOSIS — Y838 Other surgical procedures as the cause of abnormal reaction of the patient, or of later complication, without mention of misadventure at the time of the procedure: Secondary | ICD-10-CM | POA: Diagnosis present

## 2016-08-03 DIAGNOSIS — D582 Other hemoglobinopathies: Secondary | ICD-10-CM

## 2016-08-03 DIAGNOSIS — N400 Enlarged prostate without lower urinary tract symptoms: Secondary | ICD-10-CM

## 2016-08-03 DIAGNOSIS — Z79899 Other long term (current) drug therapy: Secondary | ICD-10-CM

## 2016-08-03 DIAGNOSIS — R7989 Other specified abnormal findings of blood chemistry: Secondary | ICD-10-CM

## 2016-08-03 DIAGNOSIS — Z0189 Encounter for other specified special examinations: Secondary | ICD-10-CM

## 2016-08-03 DIAGNOSIS — M19011 Primary osteoarthritis, right shoulder: Secondary | ICD-10-CM

## 2016-08-03 DIAGNOSIS — M961 Postlaminectomy syndrome, not elsewhere classified: Secondary | ICD-10-CM

## 2016-08-03 DIAGNOSIS — E876 Hypokalemia: Secondary | ICD-10-CM

## 2016-08-03 DIAGNOSIS — F1190 Opioid use, unspecified, uncomplicated: Secondary | ICD-10-CM

## 2016-08-03 DIAGNOSIS — E785 Hyperlipidemia, unspecified: Secondary | ICD-10-CM | POA: Diagnosis present

## 2016-08-03 DIAGNOSIS — R937 Abnormal findings on diagnostic imaging of other parts of musculoskeletal system: Secondary | ICD-10-CM

## 2016-08-03 DIAGNOSIS — E7800 Pure hypercholesterolemia, unspecified: Secondary | ICD-10-CM | POA: Diagnosis not present

## 2016-08-03 DIAGNOSIS — M4316 Spondylolisthesis, lumbar region: Principal | ICD-10-CM

## 2016-08-03 DIAGNOSIS — Z981 Arthrodesis status: Secondary | ICD-10-CM

## 2016-08-03 DIAGNOSIS — M533 Sacrococcygeal disorders, not elsewhere classified: Secondary | ICD-10-CM

## 2016-08-03 DIAGNOSIS — M5442 Lumbago with sciatica, left side: Secondary | ICD-10-CM

## 2016-08-03 DIAGNOSIS — Y92234 Operating room of hospital as the place of occurrence of the external cause: Secondary | ICD-10-CM | POA: Diagnosis present

## 2016-08-03 DIAGNOSIS — M48061 Spinal stenosis, lumbar region without neurogenic claudication: Secondary | ICD-10-CM | POA: Diagnosis present

## 2016-08-03 DIAGNOSIS — E781 Pure hyperglyceridemia: Secondary | ICD-10-CM

## 2016-08-03 DIAGNOSIS — M792 Neuralgia and neuritis, unspecified: Secondary | ICD-10-CM

## 2016-08-03 DIAGNOSIS — R519 Headache, unspecified: Secondary | ICD-10-CM

## 2016-08-03 DIAGNOSIS — M5412 Radiculopathy, cervical region: Secondary | ICD-10-CM

## 2016-08-03 DIAGNOSIS — M4310 Spondylolisthesis, site unspecified: Secondary | ICD-10-CM

## 2016-08-03 DIAGNOSIS — Z419 Encounter for procedure for purposes other than remedying health state, unspecified: Secondary | ICD-10-CM

## 2016-08-03 DIAGNOSIS — Z87442 Personal history of urinary calculi: Secondary | ICD-10-CM | POA: Diagnosis not present

## 2016-08-03 DIAGNOSIS — Z5181 Encounter for therapeutic drug level monitoring: Secondary | ICD-10-CM

## 2016-08-03 DIAGNOSIS — M542 Cervicalgia: Secondary | ICD-10-CM

## 2016-08-03 DIAGNOSIS — R1011 Right upper quadrant pain: Secondary | ICD-10-CM

## 2016-08-03 DIAGNOSIS — M75122 Complete rotator cuff tear or rupture of left shoulder, not specified as traumatic: Secondary | ICD-10-CM

## 2016-08-03 DIAGNOSIS — E119 Type 2 diabetes mellitus without complications: Secondary | ICD-10-CM | POA: Diagnosis present

## 2016-08-03 DIAGNOSIS — G894 Chronic pain syndrome: Secondary | ICD-10-CM | POA: Diagnosis present

## 2016-08-03 DIAGNOSIS — M4802 Spinal stenosis, cervical region: Secondary | ICD-10-CM

## 2016-08-03 DIAGNOSIS — Z87891 Personal history of nicotine dependence: Secondary | ICD-10-CM | POA: Diagnosis not present

## 2016-08-03 DIAGNOSIS — G9741 Accidental puncture or laceration of dura during a procedure: Secondary | ICD-10-CM | POA: Diagnosis not present

## 2016-08-03 DIAGNOSIS — G8929 Other chronic pain: Secondary | ICD-10-CM

## 2016-08-03 DIAGNOSIS — M25552 Pain in left hip: Secondary | ICD-10-CM

## 2016-08-03 DIAGNOSIS — M47816 Spondylosis without myelopathy or radiculopathy, lumbar region: Secondary | ICD-10-CM

## 2016-08-03 DIAGNOSIS — M7138 Other bursal cyst, other site: Secondary | ICD-10-CM

## 2016-08-03 DIAGNOSIS — G5603 Carpal tunnel syndrome, bilateral upper limbs: Secondary | ICD-10-CM

## 2016-08-03 DIAGNOSIS — E1142 Type 2 diabetes mellitus with diabetic polyneuropathy: Secondary | ICD-10-CM

## 2016-08-03 DIAGNOSIS — M25511 Pain in right shoulder: Secondary | ICD-10-CM

## 2016-08-03 DIAGNOSIS — K219 Gastro-esophageal reflux disease without esophagitis: Secondary | ICD-10-CM | POA: Diagnosis present

## 2016-08-03 DIAGNOSIS — M5416 Radiculopathy, lumbar region: Secondary | ICD-10-CM

## 2016-08-03 DIAGNOSIS — M4696 Unspecified inflammatory spondylopathy, lumbar region: Secondary | ICD-10-CM | POA: Diagnosis present

## 2016-08-03 DIAGNOSIS — Z79891 Long term (current) use of opiate analgesic: Secondary | ICD-10-CM

## 2016-08-03 DIAGNOSIS — M47812 Spondylosis without myelopathy or radiculopathy, cervical region: Secondary | ICD-10-CM

## 2016-08-03 LAB — GLUCOSE, CAPILLARY
GLUCOSE-CAPILLARY: 115 mg/dL — AB (ref 65–99)
GLUCOSE-CAPILLARY: 100 mg/dL — AB (ref 65–99)
Glucose-Capillary: 129 mg/dL — ABNORMAL HIGH (ref 65–99)
Glucose-Capillary: 120 mg/dL — ABNORMAL HIGH (ref 65–99)

## 2016-08-03 LAB — HEMOGLOBIN A1C
HEMOGLOBIN A1C: 6.6 % — AB (ref 4.8–5.6)
MEAN PLASMA GLUCOSE: 143

## 2016-08-03 SURGERY — POSTERIOR LUMBAR FUSION 1 LEVEL
Anesthesia: General

## 2016-08-03 MED ORDER — ROCURONIUM BROMIDE 50 MG/5ML IV SOSY
PREFILLED_SYRINGE | INTRAVENOUS | Status: AC
  Filled 2016-08-03: qty 10

## 2016-08-03 MED ORDER — FENTANYL CITRATE (PF) 100 MCG/2ML IJ SOLN
INTRAMUSCULAR | Status: DC | PRN
  Administered 2016-08-03: 50 ug via INTRAVENOUS
  Administered 2016-08-03: 100 ug via INTRAVENOUS
  Administered 2016-08-03: 50 ug via INTRAVENOUS
  Administered 2016-08-03: 100 ug via INTRAVENOUS

## 2016-08-03 MED ORDER — KETOROLAC TROMETHAMINE 15 MG/ML IJ SOLN
15.0000 mg | Freq: Four times a day (QID) | INTRAMUSCULAR | Status: DC
  Administered 2016-08-03 – 2016-08-04 (×3): 15 mg via INTRAVENOUS
  Filled 2016-08-03 (×3): qty 1

## 2016-08-03 MED ORDER — LACTATED RINGERS IV SOLN
INTRAVENOUS | Status: DC
  Administered 2016-08-03 (×4): via INTRAVENOUS

## 2016-08-03 MED ORDER — HEMOSTATIC AGENTS (NO CHARGE) OPTIME
TOPICAL | Status: DC | PRN
  Administered 2016-08-03: 1 via TOPICAL

## 2016-08-03 MED ORDER — THROMBIN 5000 UNITS EX SOLR
CUTANEOUS | Status: AC
  Filled 2016-08-03: qty 5000

## 2016-08-03 MED ORDER — ACETAMINOPHEN 325 MG PO TABS
650.0000 mg | ORAL_TABLET | ORAL | Status: DC | PRN
  Administered 2016-08-04: 650 mg via ORAL
  Filled 2016-08-03: qty 2

## 2016-08-03 MED ORDER — SODIUM CHLORIDE 0.9 % IV SOLN
INTRAVENOUS | Status: DC
  Administered 2016-08-04: 1000 mL via INTRAVENOUS
  Administered 2016-08-05: 07:00:00 via INTRAVENOUS

## 2016-08-03 MED ORDER — BUPIVACAINE HCL (PF) 0.5 % IJ SOLN
INTRAMUSCULAR | Status: DC | PRN
  Administered 2016-08-03: 10 mL

## 2016-08-03 MED ORDER — HYDROMORPHONE HCL 1 MG/ML IJ SOLN
INTRAMUSCULAR | Status: AC
  Administered 2016-08-03: 0.5 mg via INTRAVENOUS
  Filled 2016-08-03: qty 1

## 2016-08-03 MED ORDER — THROMBIN 5000 UNITS EX SOLR
OROMUCOSAL | Status: DC | PRN
  Administered 2016-08-03: 13:00:00 via TOPICAL

## 2016-08-03 MED ORDER — MEPERIDINE HCL 25 MG/ML IJ SOLN
INTRAMUSCULAR | Status: AC
  Administered 2016-08-03: 12.5 mg via INTRAVENOUS
  Filled 2016-08-03: qty 1

## 2016-08-03 MED ORDER — CELECOXIB 200 MG PO CAPS
200.0000 mg | ORAL_CAPSULE | Freq: Two times a day (BID) | ORAL | Status: DC
  Administered 2016-08-03 – 2016-08-06 (×6): 200 mg via ORAL
  Filled 2016-08-03 (×8): qty 1

## 2016-08-03 MED ORDER — ONDANSETRON HCL 4 MG/2ML IJ SOLN
INTRAMUSCULAR | Status: AC
  Filled 2016-08-03: qty 2

## 2016-08-03 MED ORDER — LIDOCAINE 2% (20 MG/ML) 5 ML SYRINGE
INTRAMUSCULAR | Status: DC | PRN
  Administered 2016-08-03: 100 mg via INTRAVENOUS

## 2016-08-03 MED ORDER — PROPOFOL 10 MG/ML IV BOLUS
INTRAVENOUS | Status: DC | PRN
  Administered 2016-08-03: 40 mg via INTRAVENOUS
  Administered 2016-08-03: 150 mg via INTRAVENOUS

## 2016-08-03 MED ORDER — BACITRACIN 50000 UNITS IM SOLR
INTRAMUSCULAR | Status: DC | PRN
  Administered 2016-08-03: 13:00:00

## 2016-08-03 MED ORDER — CEFAZOLIN SODIUM-DEXTROSE 2-4 GM/100ML-% IV SOLN
2.0000 g | INTRAVENOUS | Status: AC
  Administered 2016-08-03: 2 g via INTRAVENOUS

## 2016-08-03 MED ORDER — HYDROMORPHONE HCL 1 MG/ML IJ SOLN
0.2500 mg | INTRAMUSCULAR | Status: DC | PRN
  Administered 2016-08-03 (×4): 0.5 mg via INTRAVENOUS

## 2016-08-03 MED ORDER — ALBUMIN HUMAN 5 % IV SOLN
INTRAVENOUS | Status: DC | PRN
  Administered 2016-08-03: 17:00:00 via INTRAVENOUS

## 2016-08-03 MED ORDER — MENTHOL 3 MG MT LOZG: 1.0000 | LOZENGE | OROMUCOSAL | Status: DC | PRN

## 2016-08-03 MED ORDER — ONDANSETRON HCL 4 MG/2ML IJ SOLN
4.0000 mg | INTRAMUSCULAR | Status: DC | PRN
  Administered 2016-08-03 – 2016-08-07 (×8): 4 mg via INTRAVENOUS
  Filled 2016-08-03 (×9): qty 2

## 2016-08-03 MED ORDER — INSULIN ASPART 100 UNIT/ML ~~LOC~~ SOLN
0.0000 [IU] | Freq: Three times a day (TID) | SUBCUTANEOUS | Status: DC
  Administered 2016-08-04: 2 [IU] via SUBCUTANEOUS
  Administered 2016-08-05: 3 [IU] via SUBCUTANEOUS
  Administered 2016-08-05 – 2016-08-06 (×3): 2 [IU] via SUBCUTANEOUS

## 2016-08-03 MED ORDER — METHOCARBAMOL 500 MG PO TABS
500.0000 mg | ORAL_TABLET | Freq: Four times a day (QID) | ORAL | Status: DC | PRN
  Administered 2016-08-04 – 2016-08-05 (×6): 500 mg via ORAL
  Filled 2016-08-03 (×6): qty 1

## 2016-08-03 MED ORDER — MORPHINE SULFATE (PF) 2 MG/ML IV SOLN
2.0000 mg | INTRAVENOUS | Status: DC | PRN
  Administered 2016-08-03: 2 mg via INTRAVENOUS
  Filled 2016-08-03: qty 1

## 2016-08-03 MED ORDER — ROCURONIUM BROMIDE 50 MG/5ML IV SOSY
PREFILLED_SYRINGE | INTRAVENOUS | Status: AC
  Filled 2016-08-03: qty 5

## 2016-08-03 MED ORDER — BISACODYL 10 MG RE SUPP
10.0000 mg | Freq: Every day | RECTAL | Status: DC | PRN
  Administered 2016-08-06: 10 mg via RECTAL
  Filled 2016-08-03: qty 1

## 2016-08-03 MED ORDER — SODIUM CHLORIDE 0.9% FLUSH
3.0000 mL | Freq: Two times a day (BID) | INTRAVENOUS | Status: DC
  Administered 2016-08-03 – 2016-08-06 (×6): 3 mL via INTRAVENOUS

## 2016-08-03 MED ORDER — ONDANSETRON HCL 4 MG/2ML IJ SOLN
INTRAMUSCULAR | Status: DC | PRN
  Administered 2016-08-03: 4 mg via INTRAVENOUS

## 2016-08-03 MED ORDER — ACETAMINOPHEN 650 MG RE SUPP: 650.0000 mg | Freq: Four times a day (QID) | RECTAL | Status: DC | PRN

## 2016-08-03 MED ORDER — LINAGLIPTIN 5 MG PO TABS
5.0000 mg | ORAL_TABLET | Freq: Every day | ORAL | Status: DC
  Administered 2016-08-04 – 2016-08-06 (×3): 5 mg via ORAL
  Filled 2016-08-03 (×4): qty 1

## 2016-08-03 MED ORDER — MEPERIDINE HCL 25 MG/ML IJ SOLN
6.2500 mg | INTRAMUSCULAR | Status: DC | PRN
  Administered 2016-08-03 (×2): 12.5 mg via INTRAVENOUS

## 2016-08-03 MED ORDER — AMLODIPINE BESYLATE 5 MG PO TABS
5.0000 mg | ORAL_TABLET | Freq: Every day | ORAL | Status: DC
Start: 2016-08-04 — End: 2016-08-07
  Administered 2016-08-04 – 2016-08-07 (×4): 5 mg via ORAL
  Filled 2016-08-03 (×4): qty 1

## 2016-08-03 MED ORDER — CEFAZOLIN SODIUM-DEXTROSE 2-4 GM/100ML-% IV SOLN
INTRAVENOUS | Status: AC
  Filled 2016-08-03: qty 100

## 2016-08-03 MED ORDER — THROMBIN 20000 UNITS EX SOLR
CUTANEOUS | Status: AC
  Filled 2016-08-03: qty 20000

## 2016-08-03 MED ORDER — LISINOPRIL 20 MG PO TABS
20.0000 mg | ORAL_TABLET | Freq: Every day | ORAL | Status: DC
  Administered 2016-08-04 – 2016-08-07 (×4): 20 mg via ORAL
  Filled 2016-08-03 (×4): qty 1

## 2016-08-03 MED ORDER — NEOSTIGMINE METHYLSULFATE 5 MG/5ML IV SOSY
PREFILLED_SYRINGE | INTRAVENOUS | Status: AC
  Filled 2016-08-03: qty 5

## 2016-08-03 MED ORDER — PHENOL 1.4 % MT LIQD: 1.0000 | OROMUCOSAL | Status: DC | PRN

## 2016-08-03 MED ORDER — LIDOCAINE 2% (20 MG/ML) 5 ML SYRINGE
INTRAMUSCULAR | Status: AC
  Filled 2016-08-03: qty 5

## 2016-08-03 MED ORDER — FENTANYL CITRATE (PF) 100 MCG/2ML IJ SOLN
INTRAMUSCULAR | Status: AC
  Filled 2016-08-03: qty 2

## 2016-08-03 MED ORDER — METHOCARBAMOL 1000 MG/10ML IJ SOLN
500.0000 mg | Freq: Four times a day (QID) | INTRAVENOUS | Status: DC | PRN
  Filled 2016-08-03: qty 5

## 2016-08-03 MED ORDER — ROSUVASTATIN CALCIUM 40 MG PO TABS
40.0000 mg | ORAL_TABLET | Freq: Every day | ORAL | Status: DC
  Administered 2016-08-03 – 2016-08-06 (×4): 40 mg via ORAL
  Filled 2016-08-03: qty 1
  Filled 2016-08-03: qty 8
  Filled 2016-08-03 (×2): qty 1
  Filled 2016-08-03: qty 8
  Filled 2016-08-03: qty 1

## 2016-08-03 MED ORDER — GABAPENTIN 300 MG PO CAPS
300.0000 mg | ORAL_CAPSULE | Freq: Three times a day (TID) | ORAL | Status: DC
  Administered 2016-08-03 – 2016-08-06 (×9): 300 mg via ORAL
  Filled 2016-08-03 (×11): qty 1

## 2016-08-03 MED ORDER — LIDOCAINE-EPINEPHRINE 2 %-1:100000 IJ SOLN
INTRAMUSCULAR | Status: DC | PRN
  Administered 2016-08-03: 10 mL via INTRADERMAL

## 2016-08-03 MED ORDER — PHENYLEPHRINE HCL 10 MG/ML IJ SOLN
INTRAMUSCULAR | Status: DC | PRN
  Administered 2016-08-03 (×2): 40 ug via INTRAVENOUS
  Administered 2016-08-03: 120 ug via INTRAVENOUS

## 2016-08-03 MED ORDER — SUGAMMADEX SODIUM 200 MG/2ML IV SOLN
INTRAVENOUS | Status: AC
  Filled 2016-08-03: qty 2

## 2016-08-03 MED ORDER — SODIUM CHLORIDE 0.9 % IV SOLN: 250.0000 mL | INTRAVENOUS | Status: DC

## 2016-08-03 MED ORDER — CHLORTHALIDONE 25 MG PO TABS
50.0000 mg | ORAL_TABLET | Freq: Every day | ORAL | Status: DC
  Administered 2016-08-04 – 2016-08-07 (×4): 50 mg via ORAL
  Filled 2016-08-03 (×4): qty 2

## 2016-08-03 MED ORDER — DOCUSATE SODIUM 100 MG PO CAPS
100.0000 mg | ORAL_CAPSULE | Freq: Two times a day (BID) | ORAL | Status: DC
  Administered 2016-08-03 – 2016-08-06 (×6): 100 mg via ORAL
  Filled 2016-08-03 (×8): qty 1

## 2016-08-03 MED ORDER — ACETAMINOPHEN 650 MG RE SUPP: 650.0000 mg | RECTAL | Status: DC | PRN

## 2016-08-03 MED ORDER — OXYCODONE HCL 5 MG PO TABS
5.0000 mg | ORAL_TABLET | ORAL | Status: DC | PRN
  Administered 2016-08-04 – 2016-08-07 (×12): 10 mg via ORAL
  Filled 2016-08-03 (×13): qty 2

## 2016-08-03 MED ORDER — THROMBIN 20000 UNITS EX SOLR
CUTANEOUS | Status: DC | PRN
  Administered 2016-08-03: 13:00:00 via TOPICAL

## 2016-08-03 MED ORDER — MIDAZOLAM HCL 5 MG/5ML IJ SOLN
INTRAMUSCULAR | Status: DC | PRN
  Administered 2016-08-03: 2 mg via INTRAVENOUS

## 2016-08-03 MED ORDER — 0.9 % SODIUM CHLORIDE (POUR BTL) OPTIME
TOPICAL | Status: DC | PRN
  Administered 2016-08-03: 1000 mL

## 2016-08-03 MED ORDER — BUPIVACAINE HCL (PF) 0.5 % IJ SOLN
INTRAMUSCULAR | Status: AC
  Filled 2016-08-03: qty 30

## 2016-08-03 MED ORDER — PROPOFOL 10 MG/ML IV BOLUS
INTRAVENOUS | Status: AC
  Filled 2016-08-03: qty 40

## 2016-08-03 MED ORDER — LIDOCAINE-EPINEPHRINE 2 %-1:100000 IJ SOLN
INTRAMUSCULAR | Status: AC
  Filled 2016-08-03: qty 1

## 2016-08-03 MED ORDER — HYDROMORPHONE HCL 1 MG/ML IJ SOLN
1.0000 mg | INTRAMUSCULAR | Status: DC | PRN
  Administered 2016-08-03 – 2016-08-07 (×16): 1 mg via INTRAVENOUS
  Filled 2016-08-03 (×16): qty 1

## 2016-08-03 MED ORDER — MIDAZOLAM HCL 2 MG/2ML IJ SOLN
INTRAMUSCULAR | Status: AC
  Filled 2016-08-03: qty 2

## 2016-08-03 MED ORDER — SUGAMMADEX SODIUM 200 MG/2ML IV SOLN
INTRAVENOUS | Status: DC | PRN
  Administered 2016-08-03: 200 mg via INTRAVENOUS

## 2016-08-03 MED ORDER — CEFAZOLIN SODIUM-DEXTROSE 2-4 GM/100ML-% IV SOLN
2.0000 g | Freq: Three times a day (TID) | INTRAVENOUS | Status: AC
  Administered 2016-08-03 – 2016-08-04 (×2): 2 g via INTRAVENOUS
  Filled 2016-08-03 (×2): qty 100

## 2016-08-03 MED ORDER — PROMETHAZINE HCL 25 MG/ML IJ SOLN: 6.2500 mg | INTRAMUSCULAR | Status: DC | PRN

## 2016-08-03 MED ORDER — SENNA 8.6 MG PO TABS
1.0000 | ORAL_TABLET | Freq: Two times a day (BID) | ORAL | Status: DC
Start: 2016-08-03 — End: 2016-08-07
  Administered 2016-08-03 – 2016-08-06 (×6): 8.6 mg via ORAL
  Filled 2016-08-03 (×8): qty 1

## 2016-08-03 MED ORDER — FENTANYL CITRATE (PF) 100 MCG/2ML IJ SOLN
INTRAMUSCULAR | Status: AC
  Filled 2016-08-03: qty 4

## 2016-08-03 MED ORDER — ACETAMINOPHEN 325 MG PO TABS
650.0000 mg | ORAL_TABLET | Freq: Four times a day (QID) | ORAL | Status: DC | PRN
  Administered 2016-08-06: 650 mg via ORAL
  Filled 2016-08-03: qty 2

## 2016-08-03 MED ORDER — SODIUM CHLORIDE 0.9% FLUSH: 3.0000 mL | INTRAVENOUS | Status: DC | PRN

## 2016-08-03 MED ORDER — PANTOPRAZOLE SODIUM 40 MG PO TBEC
80.0000 mg | DELAYED_RELEASE_TABLET | Freq: Every day | ORAL | Status: DC
  Administered 2016-08-04 – 2016-08-07 (×4): 80 mg via ORAL
  Filled 2016-08-03 (×4): qty 2

## 2016-08-03 MED ORDER — ROCURONIUM BROMIDE 10 MG/ML (PF) SYRINGE
PREFILLED_SYRINGE | INTRAVENOUS | Status: DC | PRN
  Administered 2016-08-03: 50 mg via INTRAVENOUS
  Administered 2016-08-03: 20 mg via INTRAVENOUS
  Administered 2016-08-03: 50 mg via INTRAVENOUS

## 2016-08-03 SURGICAL SUPPLY — 80 items
BAG DECANTER FOR FLEXI CONT (MISCELLANEOUS) ×3 IMPLANT
BASKET BONE COLLECTION (BASKET) ×3 IMPLANT
BENZOIN TINCTURE PRP APPL 2/3 (GAUZE/BANDAGES/DRESSINGS) ×3 IMPLANT
BIT DRILL 3.5 POWEREASE (BIT) ×2 IMPLANT
BIT DRILL 3.5MM POWEREASE (BIT) ×1
BLADE CLIPPER SURG (BLADE) IMPLANT
BLADE SURG 11 STRL SS (BLADE) ×3 IMPLANT
BUR MATCHSTICK NEURO 3.0 LAGG (BURR) ×3 IMPLANT
BUR PRECISION FLUTE 5.0 (BURR) ×3 IMPLANT
CANISTER SUCT 3000ML PPV (MISCELLANEOUS) ×3 IMPLANT
CARTRIDGE OIL MAESTRO DRILL (MISCELLANEOUS) ×1 IMPLANT
CLOSURE WOUND 1/2 X4 (GAUZE/BANDAGES/DRESSINGS) ×1
CONT SPEC 4OZ CLIKSEAL STRL BL (MISCELLANEOUS) ×3 IMPLANT
COVER BACK TABLE 60X90IN (DRAPES) ×3 IMPLANT
DECANTER SPIKE VIAL GLASS SM (MISCELLANEOUS) ×3 IMPLANT
DERMABOND ADVANCED (GAUZE/BANDAGES/DRESSINGS) ×4
DERMABOND ADVANCED .7 DNX12 (GAUZE/BANDAGES/DRESSINGS) ×2 IMPLANT
DEVICE INTERBODY ELEVATE 23X7 (Cage) ×6 IMPLANT
DIFFUSER DRILL AIR PNEUMATIC (MISCELLANEOUS) ×3 IMPLANT
DRAPE C-ARM 42X72 X-RAY (DRAPES) ×3 IMPLANT
DRAPE C-ARMOR (DRAPES) ×3 IMPLANT
DRAPE LAPAROTOMY 100X72X124 (DRAPES) ×3 IMPLANT
DRAPE POUCH INSTRU U-SHP 10X18 (DRAPES) ×3 IMPLANT
DRAPE SURG 17X23 STRL (DRAPES) ×3 IMPLANT
DRSG OPSITE POSTOP 4X8 (GAUZE/BANDAGES/DRESSINGS) ×3 IMPLANT
DURAPREP 26ML APPLICATOR (WOUND CARE) ×3 IMPLANT
DURASEAL APPLICATOR TIP (TIP) ×3 IMPLANT
DURASEAL SPINE SEALANT 3ML (MISCELLANEOUS) ×3 IMPLANT
ELECT REM PT RETURN 9FT ADLT (ELECTROSURGICAL) ×3
ELECTRODE REM PT RTRN 9FT ADLT (ELECTROSURGICAL) ×1 IMPLANT
GAUZE SPONGE 4X4 12PLY STRL (GAUZE/BANDAGES/DRESSINGS) IMPLANT
GAUZE SPONGE 4X4 16PLY XRAY LF (GAUZE/BANDAGES/DRESSINGS) IMPLANT
GLOVE BIOGEL PI IND STRL 7.5 (GLOVE) ×1 IMPLANT
GLOVE BIOGEL PI INDICATOR 7.5 (GLOVE) ×2
GLOVE ECLIPSE 7.0 STRL STRAW (GLOVE) ×3 IMPLANT
GLOVE EXAM NITRILE LRG STRL (GLOVE) IMPLANT
GLOVE EXAM NITRILE XL STR (GLOVE) IMPLANT
GLOVE EXAM NITRILE XS STR PU (GLOVE) IMPLANT
GLOVE INDICATOR 6.5 STRL GRN (GLOVE) ×3 IMPLANT
GLOVE SURG SS PI 6.5 STRL IVOR (GLOVE) ×3 IMPLANT
GLOVE SURG SS PI 7.0 STRL IVOR (GLOVE) ×9 IMPLANT
GOWN STRL REUS W/ TWL LRG LVL3 (GOWN DISPOSABLE) ×3 IMPLANT
GOWN STRL REUS W/ TWL XL LVL3 (GOWN DISPOSABLE) IMPLANT
GOWN STRL REUS W/TWL 2XL LVL3 (GOWN DISPOSABLE) IMPLANT
GOWN STRL REUS W/TWL LRG LVL3 (GOWN DISPOSABLE) ×6
GOWN STRL REUS W/TWL XL LVL3 (GOWN DISPOSABLE)
GRAFT DURAGEN MATRIX 1WX1L (Tissue) ×3 IMPLANT
GRAFT DURAGEN MATRIX 2WX2L ×3 IMPLANT
HEMOSTAT POWDER KIT SURGIFOAM (HEMOSTASIS) ×3 IMPLANT
KIT BASIN OR (CUSTOM PROCEDURE TRAY) ×3 IMPLANT
KIT INFUSE XX SMALL 0.7CC (Orthopedic Implant) ×3 IMPLANT
KIT POSITION SURG JACKSON T1 (MISCELLANEOUS) ×3 IMPLANT
KIT ROOM TURNOVER OR (KITS) ×3 IMPLANT
MILL MEDIUM DISP (BLADE) ×3 IMPLANT
NEEDLE HYPO 18GX1.5 BLUNT FILL (NEEDLE) IMPLANT
NEEDLE HYPO 25X1 1.5 SAFETY (NEEDLE) ×3 IMPLANT
NEEDLE SPNL 18GX3.5 QUINCKE PK (NEEDLE) ×3 IMPLANT
NS IRRIG 1000ML POUR BTL (IV SOLUTION) ×3 IMPLANT
OIL CARTRIDGE MAESTRO DRILL (MISCELLANEOUS) ×3
PACK LAMINECTOMY NEURO (CUSTOM PROCEDURE TRAY) ×3 IMPLANT
PAD ARMBOARD 7.5X6 YLW CONV (MISCELLANEOUS) ×9 IMPLANT
PATTIES SURGICAL .5 X.5 (GAUZE/BANDAGES/DRESSINGS) ×3 IMPLANT
ROD SOLERA 60MM (Rod) ×6 IMPLANT
SCREW 5.5X35MM (Screw) ×4 IMPLANT
SCREW BN 35X5.5XMA NS SPNE (Screw) ×2 IMPLANT
SCREW SET SOLERA (Screw) ×12 IMPLANT
SCREW SET SOLERA TI (Screw) ×6 IMPLANT
SEALANT ADHERUS EXTEND TIP (MISCELLANEOUS) ×12 IMPLANT
SPONGE LAP 4X18 X RAY DECT (DISPOSABLE) IMPLANT
SPONGE SURGIFOAM ABS GEL 100 (HEMOSTASIS) ×3 IMPLANT
STRIP CLOSURE SKIN 1/2X4 (GAUZE/BANDAGES/DRESSINGS) ×2 IMPLANT
SUT VIC AB 0 CT1 18XCR BRD8 (SUTURE) ×3 IMPLANT
SUT VIC AB 0 CT1 8-18 (SUTURE) ×6
SUT VICRYL 3-0 RB1 18 ABS (SUTURE) ×9 IMPLANT
SYR 3ML LL SCALE MARK (SYRINGE) ×3 IMPLANT
TOWEL OR 17X24 6PK STRL BLUE (TOWEL DISPOSABLE) ×3 IMPLANT
TOWEL OR 17X26 10 PK STRL BLUE (TOWEL DISPOSABLE) ×3 IMPLANT
TRAP SPECIMEN MUCOUS 40CC (MISCELLANEOUS) IMPLANT
TRAY FOLEY W/METER SILVER 16FR (SET/KITS/TRAYS/PACK) ×3 IMPLANT
WATER STERILE IRR 1000ML POUR (IV SOLUTION) ×3 IMPLANT

## 2016-08-03 NOTE — Op Note (Signed)
PREOP DIAGNOSIS:  1. 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 L3 laminectomy with facetectomy for decompression of exiting nerve roots, more than would be required for placement of interbody graft 2. Placement of anterior interbody device - [employer] expandable 41mm cage 3. Posterior instrumentation using cortical pedicle screws at L3 4. Interbody arthrodesis, L3-4 5. Use of locally harvested bone autograft 6. Use of BMP 7. Removal of previous hardware  SURGEON: Dr. Consuella Lose, MD  ASSISTANT: None  ANESTHESIA: General Endotracheal  EBL: 400cc  SPECIMENS: None  DRAINS: None  COMPLICATIONS: None immediate  CONDITION: Hemodynamically stable to PACU  HISTORY: Howard Murphy is a 61 y.o. male who has been followed in the outpatient clinic after previously undergoing L4-5 PLIF about 6 months ago. He continued to have back and left>right leg pain. He did not improve with conservative treatments. MRI demonstrated worsening adjacent level stenosis at L3-4 with development of trace spondylolisthesis. He therefore elected to proceed with surgical decompression and fusion. Risks and benefits were reviewed and consent was obtained.  PROCEDURE IN DETAIL: After informed consent was obtained and witnessed, the patient was brought to the operating room. After induction of general anesthesia, the patient was positioned on the operative table in the prone position. All pressure points were meticulously padded. Incision was then marked out and prepped and draped in the usual sterile fashion.  After timeout was conducted, skin was infiltrated with local anesthetic. Previous skin incision was then made sharply and Bovie electrocautery was used to dissect the subcutaneous tissue until the lumbodorsal fascia was identified and incised. The muscle was then elevated in the subperiosteal plane and the L3 lamina and L3-4 facet complexes were identified.  Self-retaining retractors were then placed.  The L4 and L5 screws and rods were identified. The set screws were removed, and the rods removed. The screws appeared to maintain good purchase.  Using intraoperative fluoroscopy, entry points for bilateral L3 cortical pedicle screws were identified. Pilot holes were then created under lateral fluoroscopy and tapped to 5.5 x 35 mm.  At this point attention was turned to decompression. Complete L3 laminectomy with facetectomy was completed using a combination of Kerrison rongeurs and a high-speed drill. Good decompression of the exiting and traversing roots was confirmed.  During removal of the ligament and the medial superior articulating process on the right, a dural tear was inavertently created where the dura appeared to be significantly adherant to the epidural scar tissue. This was covered with gelfoam and addressed after decompression and placement of interbody devices and screws.  Disc space was then identified, incised, and using a combination of shavers, curettes and rongeurs, complete discectomy was completed. Endplates were prepared, and bilateral 78mm expandable cages were tapped into place at L3-4 after the interspace was packed with BMP and morcellized autograft harvested during decompression.  Good position was confirmed with fluoroscopy.  At this point, the L3 cortical pedicle screws were placed. The microscope was then draped and brought in to inspect the dural defect. This measured about 2cm. The inferior margin was difficult to visualize because of significant adherance to the scar tissue. I was therefore unable to close the dura primarily. I placed a small collagen inlay graft followed by a onlay graft. The dura and duragen was covered with dural sealant.   The rod was then placed bilaterally and set screws placed and final tightened. Final AP and lateral fluoro images confirmed good position.  The wound was then irrigated with copious  amounts of antibiotic saline, then closed in standard fashion using a combination of interrupted 0 and 3-0 Vicryl stitches in the muscular, fascial, and subcutaneous layers. Skin was then closed using standard Dermabond. Sterile dressing was then applied. The patient was then transferred to the stretcher, extubated, and taken to the postanesthesia care unit in stable hemodynamic condition.  At the end of the case all sponge, needle, cottonoid, and instrument counts were correct.

## 2016-08-03 NOTE — Transfer of Care (Signed)
Immediate Anesthesia Transfer of Care Note  Patient: Howard Murphy  Procedure(s) Performed: Procedure(s): POSTERIOR LUMBAR INTERBODY FUSION LUMBAR THREE-LUMBAR FOUR, extention of fusion (N/A)  Patient Location: PACU  Anesthesia Type:General  Level of Consciousness: awake, alert , oriented and patient cooperative  Airway & Oxygen Therapy: Patient Spontanous Breathing and Patient connected to nasal cannula oxygen  Post-op Assessment: Report given to RN, Post -op Vital signs reviewed and stable and Patient moving all extremities  Post vital signs: Reviewed and stable  Last Vitals:  Vitals:   08/03/16 0938  BP: (!) 139/91  Pulse: 75  Resp: 20  Temp: 36.7 C    Last Pain:  Vitals:   08/03/16 0944  TempSrc:   PainSc: 6       Patients Stated Pain Goal: 3 ([id number])  Complications: No apparent anesthesia complications

## 2016-08-03 NOTE — Anesthesia Preprocedure Evaluation (Addendum)
Anesthesia Evaluation  Patient identified by MRN, date of birth, ID band Patient awake    Reviewed: Allergy & Precautions, NPO status , Patient's Chart, lab work & pertinent test results  Airway Mallampati: III  TM Distance: >3 FB Neck ROM: Full    Dental  (+) Partial Upper, Missing   Pulmonary shortness of breath and with exertion,    Pulmonary exam normal breath sounds clear to auscultation       Cardiovascular hypertension, Pt. on medications Normal cardiovascular exam Rhythm:Regular Rate:Normal     Neuro/Psych  Headaches, Chronic pain syndrome  Neuromuscular disease negative psych ROS   GI/Hepatic Neg liver ROS, GERD  Medicated and Controlled,  Endo/Other  diabetes, Well Controlled, Type 2, Oral Hypoglycemic AgentsLow Testosterone  Renal/GU Renal disease  negative genitourinary   Musculoskeletal  (+) Arthritis , Osteoarthritis,  Spondylolisthesis Lumbosacral Takes opioids BID   Abdominal (+) + obese,   Peds  Hematology Polycythemia   Anesthesia Other Findings   Reproductive/Obstetrics                            Anesthesia Physical Anesthesia Plan  ASA: II  Anesthesia Plan: General   Post-op Pain Management:    Induction: Intravenous  Airway Management Planned: Oral ETT  Additional Equipment:   Intra-op Plan:   Post-operative Plan: Extubation in OR  Informed Consent: I have reviewed the patients History and Physical, chart, labs and discussed the procedure including the risks, benefits and alternatives for the proposed anesthesia with the patient or authorized representative who has indicated his/her understanding and acceptance.   Dental advisory given  Plan Discussed with: Anesthesiologist, CRNA and Surgeon  Anesthesia Plan Comments:         Anesthesia Quick Evaluation

## 2016-08-03 NOTE — H&P (Signed)
CC:  Back and leg pain  HPI: Howard Murphy is a 61 year old man I am seeing in follow-up. He underwent L4-5 fusion about 6 months ago. Unfortunately, postoperatively he has had continued back and left-sided leg pain. He does say that the left leg is the primary problem. 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 At our last visit we referred him back for facet injections. It appears he has undergone these injections, but has not had any significant improvement.   PMH: Past Medical History:  Diagnosis Date  . 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  . Back ache 05/05/2013  . Diabetes mellitus without complication ([place])   . GERD (gastroesophageal reflux disease)   . Headache   . Hyperlipemia   . Hypertension   . Kidney stones   . Nephrolithiasis   . Shortness of breath dyspnea     PSH: Past Surgical History:  Procedure Laterality Date  . BACK SURGERY  2015   x3  . CHOLECYSTECTOMY  2000  . COLONOSCOPY WITH PROPOFOL N/A 04/11/2015   Procedure: COLONOSCOPY WITH PROPOFOL;  Surgeon: Manya Silvas, MD;  Location[facility] ENDOSCOPY;  Service: Endoscopy;  Laterality: N/A;  . ESOPHAGOGASTRODUODENOSCOPY (EGD) WITH PROPOFOL N/A 04/11/2015   Procedure: ESOPHAGOGASTRODUODENOSCOPY (EGD) WITH PROPOFOL;  Surgeon: Manya Silvas, MD;  Location[facility] ENDOSCOPY;  Service: Endoscopy;  Laterality: N/A;  . SHOULDER SURGERY Right 2016  . SHOULDER SURGERY Left 06/2015    SH: Social History  Substance Use Topics  . Smoking status: Never Smoker  . Smokeless tobacco: Former Systems developer    Types: Chew    Quit date: 01/18/2013  . Alcohol use No     Comment: occasional    MEDS: Prior to Admission medications   Medication Sig Start Date End Date Taking? Authorizing Provider  amLODipine (NORVASC) 5 MG tablet Take 1  tablet (5 mg total) by mouth daily. 04/13/16  Yes Arnetha Courser, MD  chlorthalidone (HYGROTON) 50 MG tablet Take 50 mg by mouth daily.  12/07/15  Yes Historical Provider, MD  lisinopril (PRINIVIL,ZESTRIL) 20 MG tablet Take 1 tablet (20 mg total) by mouth daily. Reported on 12/08/2015 04/13/16  Yes Arnetha Courser, MD  omeprazole (PRILOSEC) 40 MG capsule Take 40 mg by mouth 2 (two) times daily. 02/07/15  Yes Historical Provider, MD  oxyCODONE-acetaminophen (PERCOCET) 10-325 MG tablet Take 1 tablet by mouth every 4 (four) hours as needed for pain. 01/25/16  Yes Consuella Lose, MD  rosuvastatin (CRESTOR) 40 MG tablet Take 1 tablet (40 mg total) by mouth at bedtime. 02/27/16  Yes Arnetha Courser, MD  sitaGLIPtin (JANUVIA) 50 MG tablet Take 1 tablet (50 mg total) by mouth daily. 04/13/16  Yes Arnetha Courser, MD  testosterone cypionate (DEPOTESTOSTERONE CYPIONATE) 200 MG/ML injection Inject 200 mg into the muscle every 14 (fourteen) days. 05/12/12  Yes Historical Provider, MD    ALLERGY: No Known Allergies  ROS: ROS  NEUROLOGIC EXAM: Awake, alert, oriented Memory and concentration grossly intact Speech fluent, appropriate CN grossly intact Motor exam: Upper Extremities Deltoid Bicep Tricep Grip  Right 5/5 5/5 5/5 5/5  Left 5/5 5/5 5/5 5/5   Lower Extremity IP Quad PF DF EHL  Right 5/5 5/5 5/5 5/5 5/5  Left 5/5 5/5 5/5 5/5 5/5   Sensation grossly intact to LT  IMGAING: MRI of the  lumbar spine was reviewed, which demonstrates good decompression at the operated level, there is no further evidence of synovial cyst, and good decompression of the L4-5 interspace as well as the lateral recess. There does appear to be progression of stenosis as well as progressed facet arthropathy with some joint diastases at L3-4. There is mild stenosis at L5-S1 which appears stable.  IMPRESSION: 61 year old man 6 months status post L4-5 fusion, with continued left-sided leg greater than back pain. He has not  responded to conservative treatment including multiple injections and medical therapy. He does have worsened stenosis, spondylolisthesis, and facet arthropathy at L3-4. 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   PLAN: will plan on proceeding with decompression and fusion at the adjacent L3-4 level. We will confirm decompression at L5-S1 from the previous synovial cyst   At this point, treatment options are limited essentially to continued medical/injection therapy, versus surgical decompression and fusion at the adjacent segment. I explained to the patient the details of the procedure, as well as the risks which include but are not limited to nerve root injury leading to leg or foot weakness/numbness and/or bowel and bladder dysfunction, CSF leak, bleeding, and infection. Possible outcomes of surgery were also discussed including the possibility of persistence or worsening of pain symptoms and the possiblity of accelerated adjacent level degeneration. The general risks of anesthesia were also reviewed including heart attack, stroke, and DVT/PE.

## 2016-08-04 ENCOUNTER — Encounter (HOSPITAL_COMMUNITY): Payer: Self-pay | Admitting: General Practice

## 2016-08-04 LAB — CBC
HEMATOCRIT: 45.2 % (ref 39.0–52.0)
Hemoglobin: 15.2 g/dL (ref 13.0–17.0)
MCH: 29.5 pg (ref 26.0–34.0)
MCHC: 33.6 g/dL (ref 30.0–36.0)
MCV: 87.8 fL (ref 78.0–100.0)
PLATELETS: 182 10*3/uL (ref 150–400)
RBC: 5.15 MIL/uL (ref 4.22–5.81)
RDW: 13.9 % (ref 11.5–15.5)
WBC: 14.8 10*3/uL — AB (ref 4.0–10.5)

## 2016-08-04 LAB — BASIC METABOLIC PANEL
ANION GAP: 11 (ref 5–15)
BUN: 9 mg/dL (ref 6–20)
CALCIUM: 8.7 mg/dL — AB (ref 8.9–10.3)
CO2: 28 mmol/L (ref 22–32)
CREATININE: 1.09 mg/dL (ref 0.61–1.24)
Chloride: 97 mmol/L — ABNORMAL LOW (ref 101–111)
GFR calc Af Amer: >60 mL/min (ref >60)
GLUCOSE: 97 mg/dL (ref 65–99)
Potassium: 2.9 mmol/L — ABNORMAL LOW (ref 3.5–5.1)
Sodium: 136 mmol/L (ref 135–145)

## 2016-08-04 LAB — GLUCOSE, CAPILLARY
Glucose-Capillary: 124 mg/dL — ABNORMAL HIGH (ref 65–99)
Glucose-Capillary: 141 mg/dL — ABNORMAL HIGH (ref 65–99)

## 2016-08-04 MED ORDER — DIAZEPAM 5 MG PO TABS
5.0000 mg | ORAL_TABLET | Freq: Three times a day (TID) | ORAL | Status: DC | PRN
  Administered 2016-08-04: 5 mg via ORAL
  Filled 2016-08-04: qty 1

## 2016-08-04 NOTE — Anesthesia Postprocedure Evaluation (Signed)
Anesthesia Post Note  Patient: [name]  Procedure(s) Performed: Procedure(s) (LRB): POSTERIOR LUMBAR INTERBODY FUSION LUMBAR THREE-LUMBAR FOUR, extention of fusion (N/A)  Patient location during evaluation: PACU Anesthesia Type: General Level of consciousness: awake and alert Pain management: pain level controlled Vital Signs Assessment: post-procedure vital signs reviewed and stable Respiratory status: spontaneous breathing, nonlabored ventilation, respiratory function stable and patient connected to nasal cannula oxygen Cardiovascular status: blood pressure returned to baseline and stable Postop Assessment: no signs of nausea or vomiting Anesthetic complications: no                    Ziggy Chanthavong A.

## 2016-08-04 NOTE — Progress Notes (Signed)
OT Cancellation Note  Patient Details Name: Howard Murphy MRN: [id number] DOB: [birth year]   Cancelled Treatment:    Reason Eval/Treat Not Completed: Medical issues which prohibited therapy.  Pt to remain flat until tomorrow per Neuro surgery.    Kiptyn Rafuse [place], OTR/L [id number]   Lucille Passy M 08/04/2016, 1:01 PM

## 2016-08-04 NOTE — Progress Notes (Signed)
PT Cancellation Note  Patient Details Name: Howard Murphy MRN: [id number] DOB: May 01, 1956   Cancelled Treatment:    Reason Eval/Treat Not Completed: Medical issues which prohibited therapy.  Patient remains on bedrest per orders.  Neurosurgery note states flat until tomorrow.  Will return tomorrow for PT evaluation.  MD:  Please write activity orders when appropriate for patient.   Despina Pole 08/04/2016, 11:26 AM Carita Pian. Sanjuana Kava, [place] Pager [phone]

## 2016-08-04 NOTE — Care Management Note (Signed)
Case Management Note  Patient Details  Name: Howard Murphy MRN: [id number] Date of Birth: 05/30/1956  Subjective/Objective:                 Patient admitted for spinal surgery 2/23. Patient to remain flat per neuro sx note at this time.    Action/Plan:  CM will continue to follow for DC planning needs.  Expected Discharge Date:                  Expected Discharge Plan:     In-House Referral:     Discharge planning Services  CM Consult  Post Acute Care Choice:    Choice offered to:     DME Arranged:    DME Agency:     HH Arranged:    HH Agency:     Status of Service:  In process, will continue to follow  If discussed at Long Length of Stay Meetings, dates discussed:    Additional Comments:  Carles Collet, RN 08/04/2016, 10:45 AM

## 2016-08-04 NOTE — Progress Notes (Addendum)
Pt arrived from PACU around 1930 complaining of 10 out of 10 pain, the pain medications that that he had were not working and he and his family requested stronger pain medication, I called and [facility] and spoke to the on call and obtain an order for Dilaudid 1 mg Q 3 hr. This is reliving his pain at this time. Pt has a dural tear and is required to lay flat for 48 hours. Held PO medications due to nausea gave Zofran 4 mg. Will continue to monitor.

## 2016-08-04 NOTE — Progress Notes (Signed)
Patient ID: Howard Murphy, male   DOB: Jan 11, 1956, 61 y.o.   MRN: [id number] Patient describes 10 out of 10 back pain. No leg pain. No numbness tingling or weakness. He does describe a headache. Dressing is flat. Good dorsi and plantar flexion. Difficult to roll or move in bed.  work on pain control today. Remained flat until tomorrow.

## 2016-08-04 NOTE — Progress Notes (Signed)
Orthopedic Tech Progress Note Patient Details:  Howard Murphy 01/20/1956 [id number]  Patient ID: [name], male   DOB: July 24, 1955, 60 y.o.   MRN: [id number]   [place] Pink 08/04/2016, 9:59 AMCalled [employer] for Lumbar brace.

## 2016-08-05 LAB — GLUCOSE, CAPILLARY
GLUCOSE-CAPILLARY: 125 mg/dL — AB (ref 65–99)
GLUCOSE-CAPILLARY: 178 mg/dL — AB (ref 65–99)
GLUCOSE-CAPILLARY: 145 mg/dL — AB (ref 65–99)
Glucose-Capillary: 143 mg/dL — ABNORMAL HIGH (ref 65–99)

## 2016-08-05 NOTE — Evaluation (Signed)
Physical Therapy Evaluation Patient Details Name: Howard Murphy MRN: [id number] DOB: [birth year] Today's Date: 08/05/2016   History of Present Illness  61 year old man 6 months status post L4-5 fusion, with continued left-sided leg greater than back pain; admitted for decompression and fusion L3-4;  has a past medical history of Arthritis; Back ache (05/05/2013); Diabetes mellitus without complication ([place]);  Headache;  Hypertension; and Shortness of breath dyspnea.  has a past surgical history that includes Shoulder surgery (Right, 2016); Colonoscopy with propofol (N/A, 04/11/2015);  Shoulder surgery (Left, 06/2015); and Back surgery (2015).  Clinical Impression   Patient is s/p above surgery resulting in functional limitations due to the deficits listed below (see PT Problem List). Overall moving quite well after a few days of flat bedrest; minguard progressing to supervision with ambulation; I anticipate good progress;  Patient will benefit from skilled PT to increase their independence and safety with mobility to allow discharge to the venue listed below.       Follow Up Recommendations Home health PT;Supervision/Assistance - 24 hour    Equipment Recommendations  Rolling walker with 5" wheels;3in1 (PT)    Recommendations for Other Services OT consult     Precautions / Restrictions Precautions Precautions: Back Precaution Booklet Issued: No Precaution Comments: Reviewed back precautions verbally. Required Braces or Orthoses: Spinal Brace Spinal Brace: Lumbar corset;Applied in sitting position Restrictions Weight Bearing Restrictions: No      Mobility  Bed Mobility Overal bed mobility: Needs Assistance Bed Mobility: Rolling;Sidelying to Sit Rolling: Min assist Sidelying to sit: Min assist       General bed mobility comments: Cues for log roll technqieu; min assist to elevate trunk to sitting  Transfers Overall transfer level: Needs assistance Equipment used: Rolling  walker (2 wheeled) Transfers: Sit to/from Stand Sit to Stand: Supervision         General transfer comment: Supervision for safety.  Ambulation/Gait Ambulation/Gait assistance: Min guard;Supervision Ambulation Distance (Feet): 150 Feet Assistive device: Rolling walker (2 wheeled) Gait Pattern/deviations: Step-through pattern;Decreased step length - right;Decreased step length - left;Decreased stride length     General Gait Details: Cues to self-monitor for activity tolerance  Stairs            Wheelchair Mobility    Modified Rankin (Stroke Patients Only)       Balance Overall balance assessment: Needs assistance Sitting-balance support: No upper extremity supported;Feet supported Sitting balance-Leahy Scale: Fair     Standing balance support: Bilateral upper extremity supported;During functional activity Standing balance-Leahy Scale: Fair Standing balance comment: Able to stand at sink for grooming tasks without UE support.                             Pertinent Vitals/Pain Pain Assessment: 0-10 Pain Score: 7  Pain Location: low back at incision Pain Descriptors / Indicators: Aching;Operative site guarding Pain Intervention(s): Monitored during session    Home Living Family/patient expects to be discharged to:: Private residence Living Arrangements: Spouse/significant other Available Help at Discharge: Family;Available 24 hours/day Type of Home: House Home Access: Stairs to enter Entrance Stairs-Rails: Right Entrance Stairs-Number of Steps: 2 Home Layout: One level Home Equipment: None      Prior Function Level of Independence: Independent               Hand Dominance   Dominant Hand: Right    Extremity/Trunk Assessment   Upper Extremity Assessment Upper Extremity Assessment: Overall WFL for tasks assessed  Lower Extremity Assessment Lower Extremity Assessment: Generalized weakness       Communication   Communication:  No difficulties  Cognition Arousal/Alertness: Awake/alert Behavior During Therapy: WFL for tasks assessed/performed Overall Cognitive Status: Within Functional Limits for tasks assessed                      General Comments      Exercises     Assessment/Plan    PT Assessment Patient needs continued PT services  PT Problem List Decreased strength;Decreased activity tolerance;Decreased balance;Decreased mobility;Decreased knowledge of use of DME;Decreased knowledge of precautions;Pain       PT Treatment Interventions DME instruction;Gait training;Stair training;Functional mobility training;Therapeutic activities;Therapeutic exercise;Patient/family education    PT Goals (Current goals can be found in the Care Plan section)  Acute Rehab PT Goals Patient Stated Goal: to have less pain PT Goal Formulation: With patient Time For Goal Achievement: 08/12/16 Potential to Achieve Goals: Good    Frequency Min 5X/week   Barriers to discharge        Co-evaluation               End of Session Equipment Utilized During Treatment: Back brace Activity Tolerance: Patient tolerated treatment well Patient left: in chair;with call bell/phone within reach;with chair alarm set Nurse Communication: Mobility status PT Visit Diagnosis: Pain;Unsteadiness on feet (R26.81) Pain - part of body:  (Back)         Time: [id number] PT Time Calculation (min) (ACUTE ONLY): 25 min   Charges:   PT Evaluation $PT Eval Moderate Complexity: 1 Procedure PT Treatments $Gait Training: 8-22 mins   PT G Codes:         Colletta [place] 08/05/2016, 6:56 PM  Roney [place], [place] Pager [phone] Office [phone]

## 2016-08-05 NOTE — Evaluation (Signed)
Occupational Therapy Evaluation Patient Details Name: Howard Murphy MRN: [id number] DOB: December 19, 1955 Today's Date: 08/05/2016    History of Present Illness 61 year old man 6 months status post L4-5 fusion, with continued left-sided leg greater than back pain; admitted for decompression and fusion L3-4;  has a past medical history of Arthritis; Back ache (05/05/2013); Diabetes mellitus without complication ([place]);  Headache;  Hypertension; and Shortness of breath dyspnea.  has a past surgical history that includes Shoulder surgery (Right, 2016); Colonoscopy with propofol (N/A, 04/11/2015);  Shoulder surgery (Left, 06/2015); and Back surgery (2015).   Clinical Impression   PTA, pt was independent with ADL and functional mobility. He currently requires max assist for LB ADL and supervision for toilet transfers and standing grooming tasks. Educated pt concerning back precautions during ADL and compensatory ADL strategies to adhere to these including: 2 cup method for oral care, use of wet-wipes for toileting, and use of AE for LB ADL. Pt reports preference to have significant other assist with LB ADL. He would benefit from continued OT services while admitted to improve independence with ADL and functional mobility. Recommend 24 hour assistance from family post-acute D/C. OT will continue to follow acutely with focus on tub transfers and pt/family education during subsequent session.    Follow Up Recommendations  No OT follow up;Supervision/Assistance - 24 hour    Equipment Recommendations  3 in 1 bedside commode    Recommendations for Other Services       Precautions / Restrictions Precautions Precautions: Back Precaution Booklet Issued: No Precaution Comments: Reviewed back precautions verbally. Required Braces or Orthoses: Spinal Brace Spinal Brace: Lumbar corset;Applied in sitting position Restrictions Weight Bearing Restrictions: No      Mobility Bed Mobility                General bed mobility comments: OOB in chair on OT arrival.  Transfers Overall transfer level: Needs assistance Equipment used: Rolling walker (2 wheeled) Transfers: Sit to/from Stand Sit to Stand: Supervision         General transfer comment: Supervision for safety.    Balance Overall balance assessment: Needs assistance Sitting-balance support: No upper extremity supported;Feet supported Sitting balance-Leahy Scale: Fair     Standing balance support: Bilateral upper extremity supported;During functional activity Standing balance-Leahy Scale: Fair Standing balance comment: Able to stand at sink for grooming tasks without UE support.                            ADL Overall ADL's : Needs assistance/impaired Eating/Feeding: Set up;Sitting   Grooming: Supervision/safety;Standing;Oral care Grooming Details (indicate cue type and reason): VC's for back precautions. Upper Body Bathing: Set up;Sitting   Lower Body Bathing: Maximal assistance;Sit to/from stand   Upper Body Dressing : Set up;Sitting   Lower Body Dressing: Maximal assistance;Sit to/from stand   Toilet Transfer: Supervision/safety;Ambulation;RW   Toileting- Clothing Manipulation and Hygiene: Minimal assistance;Sit to/from stand;Cueing for back precautions       Functional mobility during ADLs: Supervision/safety;Rolling walker General ADL Comments: Pt unable to cross legs for LB ADL while adhering to back precautions. Educated on use of AE but he reports preference to have significant other assist.     Vision Patient Visual Report: No change from baseline Vision Assessment?: No apparent visual deficits     Perception     Praxis      Pertinent Vitals/Pain Pain Assessment: 0-10 Pain Score: 10-Worst pain ever Pain Location: low back at incision Pain  Descriptors / Indicators: Aching;Operative site guarding Pain Intervention(s): Monitored during session;Repositioned;Limited activity within  patient's tolerance     Hand Dominance Right   Extremity/Trunk Assessment Upper Extremity Assessment Upper Extremity Assessment: Overall WFL for tasks assessed   Lower Extremity Assessment Lower Extremity Assessment: Defer to PT evaluation       Communication Communication Communication: No difficulties   Cognition Arousal/Alertness: Awake/alert Behavior During Therapy: WFL for tasks assessed/performed Overall Cognitive Status: Within Functional Limits for tasks assessed                     General Comments       Exercises       Shoulder Instructions      Home Living Family/patient expects to be discharged to:: Private residence Living Arrangements: Spouse/significant other Available Help at Discharge: Family;Available 24 hours/day Type of Home: House Home Access: Stairs to enter [employer] of Steps: 2 Entrance Stairs-Rails: Right Home Layout: One level     Bathroom Shower/Tub: Tub/shower unit Shower/tub characteristics: Curtain       Home Equipment: None          Prior Functioning/Environment Level of Independence: Independent                 OT Problem List: Decreased activity tolerance;Impaired balance (sitting and/or standing);Decreased safety awareness;Decreased knowledge of use of DME or AE;Decreased knowledge of precautions;Pain      OT Treatment/Interventions: Self-care/ADL training;Therapeutic exercise;Energy conservation;DME and/or AE instruction;Therapeutic activities;Patient/family education;Balance training    OT Goals(Current goals can be found in the care plan section) Acute Rehab OT Goals Patient Stated Goal: to have less pain OT Goal Formulation: With patient Time For Goal Achievement: 08/19/16 Potential to Achieve Goals: Good ADL Goals Pt Will Perform Lower Body Bathing: with min assist;with caregiver independent in assisting;sit to/from stand Pt Will Perform Lower Body Dressing: with min assist;with  caregiver independent in assisting;sit to/from stand Pt Will Transfer to Toilet: with modified independence;ambulating;bedside commode (BSC over toilet) Pt Will Perform Toileting - Clothing Manipulation and hygiene: with modified independence;sit to/from stand Pt Will Perform Tub/Shower Transfer: with modified independence;ambulating;3 in 1;rolling walker;Tub transfer Additional ADL Goal #1: Pt will recall and adhere to 3/3 back precautions during morning ADL routine.  OT Frequency: Min 2X/week   Barriers to D/C:            Co-evaluation              End of Session Equipment Utilized During Treatment: Gait belt;Rolling walker Nurse Communication: Mobility status;Other (comment) (Pt requesting medication for nausea.)  Activity Tolerance: Patient tolerated treatment well (Becoming nauseated at end of session) Patient left: in chair;with call bell/phone within reach;with chair alarm set  OT Visit Diagnosis: Unsteadiness on feet (R26.81)                ADL either performed or assessed with clinical judgement  Time: [id number] OT Time Calculation (min): 29 min Charges:  OT General Charges $OT Visit: 1 Procedure OT Evaluation $OT Eval Moderate Complexity: 1 Procedure OT Treatments $Self Care/Home Management : 8-22 mins G-Codes:     Norman Herrlich, MS OTR/L  Pager: [place] A Catalina Salasar 08/05/2016, 5:15 PM

## 2016-08-05 NOTE — Progress Notes (Signed)
Pt seen and examined.  No issues overnight. Has been flat since surgery. States minor discomfort from staying flat Reports headache but improves with Tylenol.  Denies changes in vision. Bowel or bladder dysfunction.  EXAM: Temp:  [98.5 F (36.9 C)-98.9 F (37.2 C)] 98.5 F (36.9 C) (02/25 0500) Pulse Rate:  [91-113] 110 (02/25 0500) Resp:  [16-18] 18 (02/25 0500) BP: (113-128)/(74-81) 125/76 (02/25 0500) SpO2:  [95 %-99 %] 99 % (02/25 0500) Intake/Output      02/24 0701 - 02/25 0700   Urine (mL/kg/hr) 1000 (0.4)   Total Output 1000   Net -1000        Awake and alert Follows commands throughout Full strength Surgical site: c/d/i - no signs of infection  Plan Stable Mobilize today

## 2016-08-05 NOTE — Progress Notes (Signed)
Foley catheter removed. Patient tolerated well.  Educated on increased water intake for voluntary continent urination.  Reviewed protocol of bladder scan and intermittent catheterization. Patient verbalizes understanding. Wendee Copp

## 2016-08-06 LAB — GLUCOSE, CAPILLARY
GLUCOSE-CAPILLARY: 149 mg/dL — AB (ref 65–99)
GLUCOSE-CAPILLARY: 120 mg/dL — AB (ref 65–99)
Glucose-Capillary: 119 mg/dL — ABNORMAL HIGH (ref 65–99)
Glucose-Capillary: 121 mg/dL — ABNORMAL HIGH (ref 65–99)

## 2016-08-06 MED ORDER — POLYETHYLENE GLYCOL 3350 17 G PO PACK
17.0000 g | PACK | Freq: Every day | ORAL | Status: DC
  Administered 2016-08-06: 17 g via ORAL
  Filled 2016-08-06 (×2): qty 1

## 2016-08-06 MED ORDER — PROMETHAZINE HCL 25 MG/ML IJ SOLN
25.0000 mg | Freq: Four times a day (QID) | INTRAMUSCULAR | Status: DC | PRN
  Administered 2016-08-06 – 2016-08-07 (×2): 25 mg via INTRAVENOUS
  Filled 2016-08-06 (×2): qty 1

## 2016-08-06 MED ORDER — MAGNESIUM CITRATE PO SOLN
1.0000 | Freq: Once | ORAL | Status: AC
  Administered 2016-08-06: 1 via ORAL
  Filled 2016-08-06: qty 296

## 2016-08-06 NOTE — Progress Notes (Signed)
Occupational Therapy Treatment Patient Details Name: Howard Murphy MRN: [id number] DOB: 10-02-1955 Today's Date: 08/06/2016    History of present illness Pt is a 61 year old man 6 months status post L4-5 fusion, with continued left-sided leg greater than back pain; admitted for decompression and fusion L3-4.   OT comments  Pt demonstrates good progress toward OT goals. Pt currently able to complete toilet and shower transfers with supervision for safety. He demonstrates good understanding of back precautions only requiring minimal VC's to adhere to these during shower transfer. Pt continues to require significant assistance with LB ADL but prefers to have significant other assist with this and politely declined further AE education. Pt demonstrates understanding of all education topics and reports no further questions/concerns. No further acute OT needs identified. OT will sign off.   Follow Up Recommendations  No OT follow up;Supervision/Assistance - 24 hour    Equipment Recommendations  3 in 1 bedside commode    Recommendations for Other Services      Precautions / Restrictions Precautions Precautions: Back Precaution Booklet Issued: No Precaution Comments: Reviewed back precautions verbally during functional mobility. Required Braces or Orthoses: Spinal Brace Spinal Brace: Lumbar corset;Applied in sitting position Restrictions Weight Bearing Restrictions: No       Mobility Bed Mobility               General bed mobility comments: OOB in chair on arrival.  Transfers Overall transfer level: Needs assistance Equipment used: Rolling walker (2 wheeled) Transfers: Sit to/from Stand Sit to Stand: Supervision         General transfer comment: Supervision for safety.    Balance Overall balance assessment: Needs assistance Sitting-balance support: No upper extremity supported;Feet supported Sitting balance-Leahy Scale: Fair     Standing balance support: Bilateral  upper extremity supported;During functional activity Standing balance-Leahy Scale: Fair Standing balance comment: Able to statically stand without UE support.                   ADL Overall ADL's : Needs assistance/impaired                         Toilet Transfer: Supervision/safety;Ambulation;RW       Tub/ Shower Transfer: Supervision/safety;Rolling walker;Ambulation;3 in 1 Tub/Shower Transfer Details (indicate cue type and reason): VC's to avoid twisting. Functional mobility during ADLs: Supervision/safety;Rolling walker General ADL Comments: Re-introduced benefits of AE but pt reports preference to have significant other assist.      Vision                     Perception     Praxis      Cognition   Behavior During Therapy: WFL for tasks assessed/performed Overall Cognitive Status: Within Functional Limits for tasks assessed                         Exercises     Shoulder Instructions       General Comments      Pertinent Vitals/ Pain       Pain Assessment: 0-10 Pain Score: 7  Faces Pain Scale: Hurts little more Pain Location: low back at incision Pain Descriptors / Indicators: Aching;Operative site guarding Pain Intervention(s): Monitored during session  Home Living  Prior Functioning/Environment              Frequency  Min 2X/week        Progress Toward Goals  OT Goals(current goals can now be found in the care plan section)  Progress towards OT goals: Progressing toward goals  Acute Rehab OT Goals Patient Stated Goal: to have less pain OT Goal Formulation: With patient Time For Goal Achievement: 08/19/16 Potential to Achieve Goals: Good ADL Goals Pt Will Perform Lower Body Bathing: with min assist;with caregiver independent in assisting;sit to/from stand Pt Will Perform Lower Body Dressing: with min assist;with caregiver independent in  assisting;sit to/from stand Pt Will Transfer to Toilet: with modified independence;ambulating;bedside commode Pt Will Perform Toileting - Clothing Manipulation and hygiene: with modified independence;sit to/from stand Pt Will Perform Tub/Shower Transfer: with modified independence;ambulating;3 in 1;rolling walker;Tub transfer Additional ADL Goal #1: Pt will recall and adhere to 3/3 back precautions during morning ADL routine.  Plan Discharge plan remains appropriate    Co-evaluation                 End of Session Equipment Utilized During Treatment: Rolling walker;Back brace  OT Visit Diagnosis: Unsteadiness on feet (R26.81)   Activity Tolerance Patient tolerated treatment well   Patient Left in chair;with call bell/phone within reach;with chair alarm set   Nurse Communication Mobility status        Time: [id number] OT Time Calculation (min): 13 min  Charges: OT General Charges $OT Visit: 1 Procedure OT Treatments $Self Care/Home Management : 8-22 mins  Norman Herrlich, MS OTR/L  Pager: [place] A Aiden Helzer 08/06/2016, 1:04 PM

## 2016-08-06 NOTE — Progress Notes (Signed)
Physical Therapy Treatment Patient Details Name: Howard Murphy MRN: [id number] DOB: Oct 22, 1955 Today's Date: 08/06/2016    History of Present Illness Pt is a 61 year old man 6 months status post L4-5 fusion, with continued left-sided leg greater than back pain; admitted for decompression and fusion L3-4.    PT Comments    Pt progressing towards physical therapy goals. Was able to improve ambulation distance this session, however reports fatigue upon return to room. Pt was educated on walking program, brace application/wearing schedule, and general safety awareness. Will continue to follow and progress as able per POC.    Follow Up Recommendations  Home health PT;Supervision/Assistance - 24 hour     Equipment Recommendations  Rolling walker with 5" wheels;3in1 (PT)    Recommendations for Other Services       Precautions / Restrictions Precautions Precautions: Back Precaution Booklet Issued: No Precaution Comments: Reviewed back precautions verbally during functional mobility. Required Braces or Orthoses: Spinal Brace Spinal Brace: Lumbar corset;Applied in sitting position Restrictions Weight Bearing Restrictions: No    Mobility  Bed Mobility               General bed mobility comments: (P) OOB in chair on arrival.  Transfers Overall transfer level: Needs assistance Equipment used: Rolling walker (2 wheeled) Transfers: Sit to/from Stand Sit to Stand: Supervision         General transfer comment: Supervision for safety. Increased time to achieve full standing position.   Ambulation/Gait Ambulation/Gait assistance: Min guard;Supervision Ambulation Distance (Feet): 200 Feet Assistive device: Rolling walker (2 wheeled) Gait Pattern/deviations: Step-through pattern;Decreased stride length;Trunk flexed Gait velocity: Decreased Gait velocity interpretation: Below normal speed for age/gender General Gait Details: VC's for improved posture. Generally slow and  guarded gait pattern with pt self-monitoring for activity tolerance.    Stairs Stairs:  (Pt declined stair training. )          Wheelchair Mobility    Modified Rankin (Stroke Patients Only)       Balance Overall balance assessment: (P) Needs assistance Sitting-balance support: No upper extremity supported;Feet supported Sitting balance-Leahy Scale: Fair     Standing balance support: Bilateral upper extremity supported;During functional activity Standing balance-Leahy Scale: Fair Standing balance comment: Able to stand at sink for grooming tasks without UE support.                    Cognition Arousal/Alertness: (P) Awake/alert Behavior During Therapy: (P) WFL for tasks assessed/performed Overall Cognitive Status: (P) Within Functional Limits for tasks assessed                      Exercises      General Comments        Pertinent Vitals/Pain Pain Assessment: (P) 0-10 Pain Score: (P) 7  Faces Pain Scale: Hurts little more Pain Location: (P) low back at incision Pain Descriptors / Indicators: (P) Aching;Operative site guarding Pain Intervention(s): (P) Monitored during session    Home Living                      Prior Function            PT Goals (current goals can now be found in the care plan section) Acute Rehab PT Goals Patient Stated Goal: to have less pain PT Goal Formulation: With patient Time For Goal Achievement: 08/12/16 Potential to Achieve Goals: Good Progress towards PT goals: Progressing toward goals    Frequency    Min 5X/week  PT Plan Current plan remains appropriate    Co-evaluation             End of Session Equipment Utilized During Treatment: Back brace Activity Tolerance: Patient tolerated treatment well Patient left: in chair;with call bell/phone within reach;with chair alarm set;with family/visitor present Nurse Communication: Mobility status PT Visit Diagnosis: Pain;Unsteadiness on  feet (R26.81) Pain - part of body:  (Back)     Time: [id number] PT Time Calculation (min) (ACUTE ONLY): 12 min  Charges:  $Gait Training: 8-22 mins                    G Codes:       Thelma Comp 2016/09/04, 1:03 PM   Rolinda Roan, PT, DPT Acute Rehabilitation Services Pager: [phone]

## 2016-08-06 NOTE — Clinical Social Work Note (Signed)
CSW consulted for SNF placement. O/T recommending no f/u. P/T recommending home health. CSW spoke with RNCM. Pt d/c plan is home with HH. RNCM to follow for d/c needs. CSW singing off as not further needs identified. Reconsult is new needs arise.   Oretha Ellis, [place], [place] Work [phone]

## 2016-08-06 NOTE — Progress Notes (Signed)
Pt seen and examined.  Nauseated today especially when up walking  Has been given Zofran with slight relief. Hesitant for Promethazine. Will order just in case he changes his mind. Does have bowel sounds. Small BM earlier today. Has been given rectal suppositories. Recommend Mag citrate. Continue to ambulate and work with PT/OT. Strength is appropriate. Pain fairly well controlled. Incision is c/d/i. No swelling or fluctuants at surgical site

## 2016-08-06 NOTE — Progress Notes (Signed)
Pt c/o sob in bed. No obvious distress noted. O2 sat 93% on RA. Places on 2l [place] for comfort. Assisted pt to sit up in chair. Removed O2 after 1 hour, O2 sat=95% on RA. No further c/o sob.

## 2016-08-07 LAB — GLUCOSE, CAPILLARY
Glucose-Capillary: 126 mg/dL — ABNORMAL HIGH (ref 65–99)
Glucose-Capillary: 115 mg/dL — ABNORMAL HIGH (ref 65–99)

## 2016-08-07 MED ORDER — METHOCARBAMOL 500 MG PO TABS: 500.0000 mg | ORAL_TABLET | Freq: Four times a day (QID) | ORAL | 1 refills | Status: DC | PRN

## 2016-08-07 MED ORDER — CELECOXIB 200 MG PO CAPS: 200.0000 mg | ORAL_CAPSULE | Freq: Two times a day (BID) | ORAL | 1 refills | Status: DC

## 2016-08-07 MED ORDER — GABAPENTIN 300 MG PO CAPS: 300.0000 mg | ORAL_CAPSULE | Freq: Three times a day (TID) | ORAL | 1 refills | Status: DC

## 2016-08-07 NOTE — Care Management Important Message (Signed)
Important Message  Patient Details  Name: Howard Murphy MRN: [id number] Date of Birth: 1955/09/30   Medicare Important Message Given:  Yes    Orbie Pyo 08/07/2016, 2:46 PM

## 2016-08-07 NOTE — Progress Notes (Signed)
Pt discharged home with significant other. Discharge instructions were reviewed with pt and significant other, and both verbalized understanding.

## 2016-08-07 NOTE — Care Management Note (Signed)
Case Management Note  Patient Details  Name: Howard Murphy MRN: [id number] Date of Birth: 01/22/1956  Subjective/Objective:                    Action/Plan: Pt discharging home with self care. PT recommending HH, but no orders from MD and patient refusing. Pt did have orders for rolling walker and 3 in 1. Brad with [facility] DME was notified and delivered the equipment to the room. Pts wife providing transportation home.   Expected Discharge Date:  08/07/16               Expected Discharge Plan:  Home/Self Care  In-House Referral:     Discharge planning Services  CM Consult  Post Acute Care Choice:  Durable Medical Equipment Choice offered to:  Patient, Spouse  DME Arranged:  3-N-1, Walker rolling DME Agency:  [place]:    [place]:     Status of Service:  Completed, signed off  If discussed at [place] of Stay Meetings, dates discussed:    Additional Comments:  Pollie Friar, RN 08/07/2016, 2:16 PM

## 2016-08-07 NOTE — Progress Notes (Signed)
PT Cancellation Note  Patient Details Name: Howard Murphy MRN: [id number] DOB: July 09, 1955   Cancelled Treatment:    Reason Eval/Treat Not Completed: Fatigue/lethargy limiting ability to participate;Medical issues which prohibited therapy. Pt and wife reporting pt feeling very nauseous this morning and had been throwing up. Received nausea medication and made pt very lethargic. Pt refusing PT at this time. Will re-attempt as schedule permits.    [place], [place] 08/07/2016, 9:30 AM  Nicky Pugh, PT, DPT  Acute Rehabilitation Services  Pager: [phone]

## 2016-08-07 NOTE — Progress Notes (Signed)
PT Cancellation Note  Patient Details Name: Howard Murphy MRN: [id number] DOB: Jul 11, 1955   Cancelled Treatment:    Reason Eval/Treat Not Completed: Pt continues to be sleepy this morning. Discussed rehab plan and pt declines, stating he does not feel he needs further PT services. Per nursing staff, pt, and significant other, pt independent with mobility this morning and walking in room. Will sign off at this time per pt's request. If needs change, please reconsult.     Thelma Comp 08/07/2016, 11:35 AM   Rolinda Roan, PT, DPT Acute Rehabilitation Services Pager: [phone]

## 2016-08-07 NOTE — Progress Notes (Signed)
No issues overnight. Pt did cont to have nausea, emesis x1 this am. Continues to report no significant leg pain, back soreness but different from preop.  EXAM:  BP 126/88 (BP Location: Left Arm)   Pulse (!) 107   Temp 98.8 F (37.1 C) (Oral)   Resp 17   Ht 5\' 7"  (1.702 m)   Wt 95 kg (209 lb 8 oz)   SpO2 95%   BMI 32.81 kg/m   Awake, alert, oriented  Speech fluent, appropriate  CN grossly intact  5/5 BUE/BLE  Wound c/d/i  IMPRESSION:  61 y.o. male s/p PLIF L3-4, improvement in preop pain. Has postop nausea.  PLAN: - Could d/c home this pm if nausea is improved and tolerating diet.

## 2016-08-07 NOTE — Discharge Summary (Signed)
Physician Discharge Summary  Patient ID: ANGAS SHINDLEDECKER MRN: [id number] DOB/AGE: 1956/02/12 61 y.o.  Admit date: 08/03/2016 Discharge date: 08/07/2016  Admission Diagnoses:  1. Adjacent level stenosis, spondylolisthesis L3-4  Discharge Diagnoses:  Same Active Problems:   Spondylolisthesis at L3-L4 level  Discharged Condition: Stable  Hospital Course:  DAYVIN MANCHEGO is a 61 y.o. male who presented for inpatient procedure listed below. There were no post-surgical complications with the exception of nausea which had improved significantly prior to discharge and he elected to go home.   Treatments: PROCEDURE: 1. L3 laminectomy with facetectomy for decompression of exiting nerve roots, more than would be required for placement of interbody graft 2. Placement of anterior interbody device - [employer] expandable 45mm cage 3. Posterior instrumentation using cortical pedicle screws at L3 4. Interbody arthrodesis, L3-4 5. Use of locally harvested bone autograft 6. Use of BMP 7. Removal of previous hardware  Discharge Exam: Blood pressure 126/88, pulse (!) 107, temperature 98.8 F (37.1 C), temperature source Oral, resp. rate 17, height 5\' 7"  (1.702 m), weight 95 kg (209 lb 8 oz), SpO2 95 %. Awake, alert, oriented Speech fluent, appropriate CN grossly intact 5/5 BUE/BLE Wound c/d/i  Disposition: 01-Home or Self Care  Discharge Instructions    Call MD for:  difficulty breathing, headache or visual disturbances    Complete by:  As directed    Call MD for:  persistant dizziness or light-headedness    Complete by:  As directed    Call MD for:  redness, tenderness, or signs of infection (pain, swelling, redness, odor or green/yellow discharge around incision site)    Complete by:  As directed    Call MD for:  severe uncontrolled pain    Complete by:  As directed    Call MD for:  temperature >100.4    Complete by:  As directed    Diet general    Complete by:  As directed    Driving Restrictions    Complete by:  As directed    Do not drive until given clearance.   For home use only DME 3 n 1    Complete by:  As directed    For home use only DME Walker rolling    Complete by:  As directed    Patient needs a walker to treat with the following condition:  Herniated nucleus pulposus, lumbar   Increase activity slowly    Complete by:  As directed    Lifting restrictions    Complete by:  As directed    Do not lift anything >10lbs. Avoid bending and twisting in awkward positions. Avoid bending at the back.   May shower / Bathe    Complete by:  As directed    In 24 hours. Okay to wash wound with warm soapy water. Avoid scrubbing the wound. Pat dry.   Remove dressing in 24 hours    Complete by:  As directed      Allergies as of 08/07/2016   No Known Allergies     Medication List    TAKE these medications   amLODipine 5 MG tablet Commonly known as:  NORVASC Take 1 tablet (5 mg total) by mouth daily.   celecoxib 200 MG capsule Commonly known as:  CELEBREX Take 1 capsule (200 mg total) by mouth every 12 (twelve) hours.   chlorthalidone 50 MG tablet Commonly known as:  HYGROTON Take 50 mg by mouth daily.   gabapentin 300 MG capsule Commonly known as:  NEURONTIN  Take 1 capsule (300 mg total) by mouth 3 (three) times daily.   lisinopril 20 MG tablet Commonly known as:  PRINIVIL,ZESTRIL Take 1 tablet (20 mg total) by mouth daily. Reported on 12/08/2015   methocarbamol 500 MG tablet Commonly known as:  ROBAXIN Take 1 tablet (500 mg total) by mouth every 6 (six) hours as needed for muscle spasms.   omeprazole 40 MG capsule Commonly known as:  PRILOSEC Take 40 mg by mouth 2 (two) times daily.   oxyCODONE-acetaminophen 10-325 MG tablet Commonly known as:  PERCOCET Take 1 tablet by mouth every 4 (four) hours as needed for pain.   rosuvastatin 40 MG tablet Commonly known as:  CRESTOR Take 1 tablet (40 mg total) by mouth at bedtime.   sitaGLIPtin 50  MG tablet Commonly known as:  JANUVIA Take 1 tablet (50 mg total) by mouth daily.   testosterone cypionate 200 MG/ML injection Commonly known as:  DEPOTESTOSTERONE CYPIONATE Inject 200 mg into the muscle every 14 (fourteen) days.            Durable Medical Equipment        Start     Ordered   08/07/16 0000  For home use only DME Walker rolling    Question:  Patient needs a walker to treat with the following condition  Answer:  Herniated nucleus pulposus, lumbar   08/07/16 1336   08/07/16 0000  For home use only DME 3 n 1     08/07/16 1336   08/06/16 1635  For home use only DME 3 n 1  Once     08/06/16 1635   08/06/16 1635  For home use only DME Walker rolling  Once    Question:  Patient needs a walker to treat with the following condition  Answer:  Status post lumbar surgery   08/06/16 1635     Follow-up Information    NUNDKUMAR, NEELESH, C, MD. Schedule an appointment as soon as possible for a visit in 3 week(s).   Specialty:  Neurosurgery Contact information: [address] [place] [address] [place] [address] [phone]           Signed: Traci Sermon 08/07/2016, 1:36 PM

## 2016-08-08 ENCOUNTER — Encounter: Payer: Self-pay | Admitting: Emergency Medicine

## 2016-08-08 ENCOUNTER — Emergency Department: Payer: Medicare Other

## 2016-08-08 ENCOUNTER — Emergency Department
Admission: EM | Admit: 2016-08-08 | Discharge: 2016-08-08 | Disposition: A | Discharge status: Home / Self Care | Payer: Medicare Other | Attending: Emergency Medicine | Admitting: Emergency Medicine

## 2016-08-08 DIAGNOSIS — L03114 Cellulitis of left upper limb: Secondary | ICD-10-CM | POA: Diagnosis not present

## 2016-08-08 DIAGNOSIS — R110 Nausea: Secondary | ICD-10-CM | POA: Diagnosis present

## 2016-08-08 DIAGNOSIS — Z79899 Other long term (current) drug therapy: Secondary | ICD-10-CM | POA: Diagnosis not present

## 2016-08-08 DIAGNOSIS — R42 Dizziness and giddiness: Secondary | ICD-10-CM | POA: Diagnosis not present

## 2016-08-08 DIAGNOSIS — Z7984 Long term (current) use of oral hypoglycemic drugs: Secondary | ICD-10-CM | POA: Diagnosis not present

## 2016-08-08 DIAGNOSIS — I10 Essential (primary) hypertension: Secondary | ICD-10-CM | POA: Diagnosis not present

## 2016-08-08 DIAGNOSIS — Z87891 Personal history of nicotine dependence: Secondary | ICD-10-CM | POA: Insufficient documentation

## 2016-08-08 DIAGNOSIS — E860 Dehydration: Secondary | ICD-10-CM | POA: Diagnosis not present

## 2016-08-08 DIAGNOSIS — E119 Type 2 diabetes mellitus without complications: Secondary | ICD-10-CM | POA: Insufficient documentation

## 2016-08-08 LAB — BASIC METABOLIC PANEL
Anion gap: 12 (ref 5–15)
BUN: 19 mg/dL (ref 6–20)
CALCIUM: 9.1 mg/dL (ref 8.9–10.3)
CHLORIDE: 99 mmol/L — AB (ref 101–111)
CO2: 28 mmol/L (ref 22–32)
CREATININE: 1.15 mg/dL (ref 0.61–1.24)
GFR calc Af Amer: >60 mL/min (ref >60)
GFR calc non Af Amer: >60 mL/min (ref >60)
Glucose, Bld: 139 mg/dL — ABNORMAL HIGH (ref 65–99)
Potassium: 3 mmol/L — ABNORMAL LOW (ref 3.5–5.1)
SODIUM: 139 mmol/L (ref 135–145)

## 2016-08-08 LAB — URINALYSIS, COMPLETE (UACMP) WITH MICROSCOPIC
Bacteria, UA: NONE SEEN
Bilirubin Urine: NEGATIVE
Glucose, UA: NEGATIVE mg/dL
Hgb urine dipstick: NEGATIVE
Ketones, ur: 20 mg/dL — AB
LEUKOCYTES UA: NEGATIVE
Nitrite: NEGATIVE
PH: 7 (ref 5.0–8.0)
Protein, ur: 30 mg/dL — AB
SPECIFIC GRAVITY, URINE: 1.024 (ref 1.005–1.030)

## 2016-08-08 LAB — CBC
HCT: 50.5 % (ref 40.0–52.0)
Hemoglobin: 17 g/dL (ref 13.0–18.0)
MCH: 29.4 pg (ref 26.0–34.0)
MCHC: 33.7 g/dL (ref 32.0–36.0)
MCV: 87.2 fL (ref 80.0–100.0)
PLATELETS: 262 10*3/uL (ref 150–440)
RBC: 5.79 MIL/uL (ref 4.40–5.90)
RDW: 14 % (ref 11.5–14.5)
WBC: 12.4 10*3/uL — AB (ref 3.8–10.6)

## 2016-08-08 MED ORDER — ONDANSETRON HCL 4 MG/2ML IJ SOLN
4.0000 mg | Freq: Once | INTRAMUSCULAR | Status: AC
  Administered 2016-08-08: 4 mg via INTRAVENOUS
  Filled 2016-08-08: qty 2

## 2016-08-08 MED ORDER — SODIUM CHLORIDE 0.9 % IV BOLUS (SEPSIS)
1000.0000 mL | Freq: Once | INTRAVENOUS | Status: AC
  Administered 2016-08-08: 1000 mL via INTRAVENOUS

## 2016-08-08 MED ORDER — CEPHALEXIN 500 MG PO CAPS: 500.0000 mg | ORAL_CAPSULE | Freq: Four times a day (QID) | ORAL | 0 refills | Status: AC

## 2016-08-08 MED ORDER — MORPHINE SULFATE (PF) 4 MG/ML IV SOLN
4.0000 mg | Freq: Once | INTRAVENOUS | Status: AC
  Administered 2016-08-08: 4 mg via INTRAVENOUS
  Filled 2016-08-08: qty 1

## 2016-08-08 MED ORDER — HYDROMORPHONE HCL 1 MG/ML IJ SOLN
1.0000 mg | Freq: Once | INTRAMUSCULAR | Status: AC
  Administered 2016-08-08: 1 mg via INTRAVENOUS

## 2016-08-08 MED ORDER — PROMETHAZINE HCL 25 MG PO TABS: 25.0000 mg | ORAL_TABLET | Freq: Four times a day (QID) | ORAL | 0 refills | Status: DC | PRN

## 2016-08-08 MED ORDER — MECLIZINE HCL 25 MG PO TABS
25.0000 mg | ORAL_TABLET | Freq: Once | ORAL | Status: AC
  Administered 2016-08-08: 25 mg via ORAL
  Filled 2016-08-08: qty 1

## 2016-08-08 MED ORDER — HYDROMORPHONE HCL 1 MG/ML IJ SOLN
INTRAMUSCULAR | Status: AC
  Administered 2016-08-08: 1 mg via INTRAVENOUS
  Filled 2016-08-08: qty 1

## 2016-08-08 NOTE — ED Notes (Signed)
ED Provider at bedside. 

## 2016-08-08 NOTE — ED Provider Notes (Signed)
[facility] Emergency Department Provider Note  Time seen: 12:42 PM  I have reviewed the triage vital signs and the nursing notes.   HISTORY  Chief Complaint Dizziness and Back Pain    HPI ZACHAREY SHAUB is a 61 y.o. male with a past medical history arthritis, diabetes, gastric reflux, hypertension, hyperlipidemia, chronic back pain status post lumbar operation 5 days ago who presents to the emergency department with dizziness. According to the patient and family for the past several days he has not been eating, he states he has no appetite and continues to have nausea. Patient is taking pain medication. This morning the patient states he got up and rolled over in bed and felt very dizzy which she describes as a spinning sensation. He then states he got up and felt lightheaded like he was going to pass out so he laid back down. Denies any headache. Denies any focal weakness or numbness. Family states they have noted a mild slurring of his speech at times but the patient is taking pain medication. They deny any current speech deficit. Patient states he is not dizzy as long as he remains still but if he attempts to get up or turn over he will get dizzy.  Past Medical History:  Diagnosis Date  . 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  . Back ache 05/05/2013  . Diabetes mellitus without complication ([place])   . GERD (gastroesophageal reflux disease)   . Headache   . Hyperlipemia   . Hypertension   . Kidney stones   . Nephrolithiasis   . Shortness of breath dyspnea     Patient Active Problem List   Diagnosis Date Noted  . Spondylolisthesis at L3-L4 level 08/03/2016  . Chronic pain syndrome 07/09/2016  . Encounter for medication monitoring 02/24/2016  . Cervical spondylosis 12/17/2015  . Cervical facet arthropathy 12/17/2015  . Chronic neck pain (Bilateral) (R>L) 12/17/2015  . Cervical facet syndrome (Bilateral) (R>L) 12/17/2015  . Chronic upper extremity pain  (Bilateral) (R>L) 12/17/2015  . Cervical foraminal stenosis 12/17/2015  .  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 Arthropathy of shoulder (Right) 12/17/2015  . 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 Lumbar foraminal stenosis (L4-5) (Left) 12/17/2015  . Grade 1 Anterolisthesis (2 mm) of L4 over L5. 12/17/2015  . Lumbar facet (L4-5) synovial cyst (6 mm) (Left) 12/17/2015  . Chronic sacroiliac joint pain (Bilateral) (L>R) 12/08/2015  . Neurogenic pain 12/08/2015  . Abnormal MRI, lumbar spine 11/21/2015  . Radicular pain of shoulder (Right) 10/03/2015  . Carpal tunnel syndrome (Bilateral) 10/03/2015  . Chronic cervical radicular pain (Right) 10/03/2015  . Chronic left-sided low back pain with left-sided sciatica 09/06/2015  . Opiate use (60 MME/Day) 09/05/2015  . Long term prescription opiate use 09/05/2015  . Long term current use of opiate analgesic 09/05/2015  . Chronic low back pain (Bilateral) (L>R) 09/05/2015  . Lumbar spondylosis 09/05/2015  . Lumbar facet syndrome (Bilateral) (L>R) 09/05/2015  . Chronic hip pain (Left) 09/05/2015  . Diabetic peripheral neuropathy ([place]) 09/05/2015  . Chronic lumbar radicular pain (S1 Dermatome) (Bilateral) (L>R) 09/05/2015  . Hypokalemia 09/05/2015  . Chronic shoulder pain (Location of Primary Source of Pain) (Bilateral) (L>R) 09/05/2015  . Non-insulin dependent type 2 diabetes mellitus ([place]) 09/05/2015  . Chronic foot pain (Location of Secondary source of pain) (Bilateral) (L>R) 09/05/2015  . Chronic hand pain (Location of Tertiary source of pain) (  Bilateral) (R>L) 09/05/2015  . Lumbar facet hypertrophy 09/05/2015  . Encounter for therapeutic drug level monitoring 09/05/2015  . Encounter for pain management planning 09/05/2015  . Low testosterone 09/05/2015  . Occipital pain (Right) 09/05/2015  . 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  Hypertriglyceridemia 07/26/2015  . Chronic abdominal pain (RUQ) 07/14/2015  . Type 2 diabetes mellitus with peripheral neuropathy ([place]) 07/14/2015  . Chest pain 07/14/2015  . Complete tear of the shoulder rotator cuff (Left) 05/10/2015  . Breath shortness 03/22/2015  . Elevated hemoglobin ([place]) 07/07/2013  . History of lumbar facet Synovial cyst, surgically removed. 05/29/2013    Class: History of  . 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 Acid reflux 05/05/2013  . BP (high blood pressure) 05/08/2011  . Hypercholesteremia 05/08/2011    Past Surgical History:  Procedure Laterality Date  . BACK SURGERY  2015   x3  . CHOLECYSTECTOMY  2000  . COLONOSCOPY WITH PROPOFOL N/A 04/11/2015   Procedure: COLONOSCOPY WITH PROPOFOL;  Surgeon: Manya Silvas, MD;  Location: [facility] ENDOSCOPY;  Service: Endoscopy;  Laterality: N/A;  . ESOPHAGOGASTRODUODENOSCOPY (EGD) WITH PROPOFOL N/A 04/11/2015   Procedure: ESOPHAGOGASTRODUODENOSCOPY (EGD) WITH PROPOFOL;  Surgeon: Manya Silvas, MD;  Location[facility] ENDOSCOPY;  Service: Endoscopy;  Laterality: N/A;  . SHOULDER SURGERY Right 2016  . SHOULDER SURGERY Left 06/2015    Prior to Admission medications   Medication Sig Start Date End Date Taking? Authorizing Provider  amLODipine (NORVASC) 5 MG tablet Take 1 tablet (5 mg total) by mouth daily. 04/13/16   Arnetha Courser, MD  celecoxib (CELEBREX) 200 MG capsule Take 1 capsule (200 mg total) by mouth every 12 (twelve) hours. 08/07/16   Vincent J Costella, PA-C  chlorthalidone (HYGROTON) 50 MG tablet Take 50 mg by mouth daily.  12/07/15   Historical Provider, MD  gabapentin (NEURONTIN) 300 MG capsule Take 1 capsule (300 mg total) by mouth 3 (three) times daily. 08/07/16   Vincent J Costella, PA-C  lisinopril (PRINIVIL,ZESTRIL) 20 MG tablet Take 1 tablet (20 mg total) by mouth daily. Reported on 12/08/2015 04/13/16   Arnetha Courser, MD  methocarbamol (ROBAXIN) 500 MG tablet Take 1 tablet (500 mg total) by mouth  every 6 (six) hours as needed for muscle spasms. 08/07/16   Vista Mink Costella, PA-C  omeprazole (PRILOSEC) 40 MG capsule Take 40 mg by mouth 2 (two) times daily. 02/07/15   Historical Provider, MD  oxyCODONE-acetaminophen (PERCOCET) 10-325 MG tablet Take 1 tablet by mouth every 4 (four) hours as needed for pain. 01/25/16   Consuella Lose, MD  rosuvastatin (CRESTOR) 40 MG tablet Take 1 tablet (40 mg total) by mouth at bedtime. 02/27/16   Arnetha Courser, MD  sitaGLIPtin (JANUVIA) 50 MG tablet Take 1 tablet (50 mg total) by mouth daily. 04/13/16   Arnetha Courser, MD  testosterone cypionate (DEPOTESTOSTERONE CYPIONATE) 200 MG/ML injection Inject 200 mg into the muscle every 14 (fourteen) days. 05/12/12   Historical Provider, MD    No Known Allergies  Family History  Problem Relation Age of Onset  . Cancer Mother     Breast CA  . Stroke Father   . Heart disease Father   . Cancer Sister   . Cancer Maternal Grandmother   . Cancer Sister   . Bladder Cancer Neg Hx   . Prostate cancer Neg Hx   . Kidney cancer Neg Hx     Social History Social History  Substance Use Topics  . Smoking status: Never  Smoker  . Smokeless tobacco: Former Systems developer    Types: Chew    Quit date: 01/18/2013  . Alcohol use No     Comment: occasional    Review of Systems Constitutional: Negative for fever. Cardiovascular: Negative for chest pain. Respiratory: Negative for shortness of breath. Gastrointestinal: Negative for abdominal pain Neurological: Negative for headaches, focal weakness or numbness. 10-point ROS otherwise negative.  ____________________________________________   PHYSICAL EXAM:  VITAL SIGNS: ED Triage Vitals  Enc Vitals Group     BP 08/08/16 1210 (!) 136/99     Pulse Rate 08/08/16 1210 (!) 106     Resp 08/08/16 1210 16     Temp 08/08/16 1210 98.1 F (36.7 C)     Temp Source 08/08/16 1210 Oral     SpO2 08/08/16 1210 95 %     Weight 08/08/16 1211 192 lb (87.1 kg)     Height 08/08/16 1211  5\' 7"  (1.702 m)     Head Circumference --      Peak Flow --      Pain Score 08/08/16 1211 6     Pain Loc --      Pain Edu? --      Excl. in [place]? --     Constitutional: Alert and oriented. Well appearing and in no distress. Eyes: Normal exam, PERRL ENT   Head: Normocephalic and atraumatic.Tympanic membranes are normal.   Mouth/Throat: Mucous membranes are moist. Cardiovascular: Normal rate, regular rhythm. No murmur Respiratory: Normal respiratory effort without tachypnea nor retractions. Breath sounds are clear  Gastrointestinal: Soft and nontender. No distention. Musculoskeletal: Nontender with normal range of motion in all extremities. No lower extremity tenderness. Translucent dressing over her lumbar incision appears well, no signs of dehiscence or infection. Neurologic:  Normal speech and language. No gross focal neurologic deficits are appreciated. Able to move all extremities well. No sensory deficits. Cranial nerves intact. Skin:  Skin is warm, dry and intact.  Psychiatric: Mood and affect are normal. Speech and behavior are normal.   ____________________________________________    EKG  EKG reviewed and interpreted by myself shows sinus tachycardia 109 bpm, narrow QRS, normal axis, normal intervals, nonspecific ST changes without ST elevation.  ____________________________________________    RADIOLOGY  CT negative  ____________________________________________   INITIAL IMPRESSION / ASSESSMENT AND PLAN / ED COURSE  Pertinent labs & imaging results that were available during my care of the patient were reviewed by me and considered in my medical decision making (see chart for details).  The patient presents to the emergency department with dizziness since getting up this morning. States is intermittent only if He moves or sits up. Describes his dizziness as a spinning sensation. States he's had this one time previously approximate 30 years ago but has not had it  since. Patient minutes he has not been eating or drinking much since his operation due to lack of appetite. Patient is somewhat tachycardic Route 110 bpm currently. We will IV hydrate, check labs, obtain a CT scan of head and closely monitor in the emergency department.  CT scan of the head is negative. Patient's labs show mild dehydration with ketones and the urinalysis. We will IV hydrate with a second liter of fluid. Overall patient appears well. I discussed the importance of increasing his oral intake at home especially of fluids and using ensure shakes for calorie supplementation. Discussed return precautions. Patient will be discharged home.  ____________________________________________   FINAL CLINICAL IMPRESSION(S) / ED DIAGNOSES  Dizziness Dehydration   Lennette Bihari  Gretchen Weinfeld, MD 08/08/16 1456

## 2016-08-08 NOTE — ED Triage Notes (Addendum)
Pt to ED via EMS from home, pt had back surgery 2/23, c/o dizziness with any movement. Pt denies any CP or SOB. Pt ambulatory, denies any incontinence/numbness,tingling. Per EMS pt had CSF leakage during surgery and laid supine x3days. Pt A&Ox4.

## 2016-08-08 NOTE — ED Notes (Signed)
Pt has redness and swelling noted to left hand from IV site when hospitalized for surgery on 2/23. MD aware, at bedside to access

## 2016-08-08 NOTE — ED Provider Notes (Signed)
Patient was seen for his chief complaint by Dr. Kerman Passey. I was called to the room after Dr. Kerman Passey had left his shift due to concern about erythema, and swelling in the patient's left hand near the site of an IV that was placed during his inpatient hospitalization. The patient and his wife report that over the last several days, his symptoms have worsened but he has not had fever. I'm given them instructions for elevation above the heart, cryotherapy for 10 minutes every 2 hours, and we'll start the patient on a seven-day course of Keflex. They will make an appointment with her primary care physician in 2-3 days for reevaluation. The area of erythema will be demarcated with a skin pen and they will follow this. The [place] return precautions as well as follow-up instructions.   Eula Listen, MD 08/08/16 [phone]

## 2016-08-08 NOTE — ED Notes (Signed)
Patient transported to CT 

## 2016-08-09 HISTORY — PX: BACK SURGERY: SHX140

## 2016-09-03 DIAGNOSIS — M4316 Spondylolisthesis, lumbar region: Secondary | ICD-10-CM | POA: Diagnosis not present

## 2016-09-03 DIAGNOSIS — M5442 Lumbago with sciatica, left side: Secondary | ICD-10-CM | POA: Diagnosis not present

## 2016-10-15 DIAGNOSIS — Z0000 Encounter for general adult medical examination without abnormal findings: Secondary | ICD-10-CM | POA: Diagnosis not present

## 2016-10-15 DIAGNOSIS — Z1389 Encounter for screening for other disorder: Secondary | ICD-10-CM | POA: Diagnosis not present

## 2016-10-23 DIAGNOSIS — Z1211 Encounter for screening for malignant neoplasm of colon: Secondary | ICD-10-CM | POA: Diagnosis not present

## 2016-11-07 ENCOUNTER — Telehealth: Payer: Self-pay | Admitting: Family Medicine

## 2016-11-07 NOTE — Telephone Encounter (Signed)
Spoke with patient and he has stated that his has transferred back to [facility]. It had already been changed in the system.

## 2016-11-07 NOTE — Telephone Encounter (Signed)
Patient's last visit was Sept 2017 I'm canceling his labs because they have expired Please ask him to schedule a follow-up visit soon and we'll get fasting labs then Thank you

## 2016-11-29 ENCOUNTER — Other Ambulatory Visit: Payer: Self-pay | Admitting: Nurse Practitioner

## 2016-11-29 ENCOUNTER — Ambulatory Visit (HOSPITAL_BASED_OUTPATIENT_CLINIC_OR_DEPARTMENT_OTHER): Payer: Medicare Other | Admitting: Nurse Practitioner

## 2016-11-29 ENCOUNTER — Encounter: Payer: Self-pay | Admitting: Nurse Practitioner

## 2016-11-29 ENCOUNTER — Ambulatory Visit
Admission: RE | Admit: 2016-11-29 | Discharge: 2016-11-29 | Disposition: A | Discharge status: Home / Self Care | Payer: Medicare Other | Source: Ambulatory Visit | Attending: Nurse Practitioner | Admitting: Nurse Practitioner

## 2016-11-29 VITALS — BP 142/87 | HR 64 | Temp 97.9°F | Resp 16 | Ht 67.0 in | Wt 197.0 lb

## 2016-11-29 DIAGNOSIS — M25511 Pain in right shoulder: Secondary | ICD-10-CM

## 2016-11-29 DIAGNOSIS — G8929 Other chronic pain: Secondary | ICD-10-CM | POA: Diagnosis not present

## 2016-11-29 DIAGNOSIS — M533 Sacrococcygeal disorders, not elsewhere classified: Secondary | ICD-10-CM | POA: Insufficient documentation

## 2016-11-29 DIAGNOSIS — M25512 Pain in left shoulder: Secondary | ICD-10-CM | POA: Diagnosis not present

## 2016-11-29 DIAGNOSIS — M47896 Other spondylosis, lumbar region: Secondary | ICD-10-CM

## 2016-11-29 DIAGNOSIS — Z79891 Long term (current) use of opiate analgesic: Secondary | ICD-10-CM | POA: Diagnosis not present

## 2016-11-29 DIAGNOSIS — M47816 Spondylosis without myelopathy or radiculopathy, lumbar region: Secondary | ICD-10-CM

## 2016-11-29 NOTE — Patient Instructions (Signed)
Please get your x-rays done as soon as possible. °

## 2016-11-29 NOTE — Progress Notes (Signed)
Safety precautions to be maintained throughout the outpatient stay will include: orient to surroundings, keep bed in low position, maintain call bell within reach at all times, provide assistance with transfer out of bed and ambulation.  

## 2016-11-29 NOTE — Progress Notes (Signed)
Patient's Name: Howard Murphy  MRN: [id number]  Referring Provider: [facility], [place]: January 02, 1956  PCP: [facility], [place]: 11/29/2016  Note by: Vevelyn Francois NP  Service setting: Ambulatory outpatient  Specialty: Interventional Pain Management  Location: [facility] (AMB) Pain Management Facility    Patient type: Established    Primary Reason(s) for Visit: Evaluation of chronic illnesses with exacerbation, or progression (Level of risk: moderate) CC: Back Pain (lower); Shoulder Pain (bilateral); and Hand Pain (bilateral)  HPI  Howard Murphy is a 61 y.o. year old, male patient, who comes today for a follow-up evaluation. He has Benign fibroma of prostate; Elevated hemoglobin (HCC); GERD (gastroesophageal reflux disease); Hypertension; Hyperlipidemia; History of lumbar facet Synovial cyst, surgically removed.; RA (rheumatoid arthritis) ([place]); SOB (shortness of breath); Chronic abdominal pain (RUQ); Type 2 diabetes mellitus with peripheral neuropathy ([place]); Chest pain; Hypertriglyceridemia; Opiate use (60 MME/Day); Long term prescription opiate use; Long term current use of opiate analgesic; Chronic low back pain (Bilateral) (L>R); Lumbar spondylosis; Lumbar facet syndrome (Bilateral) (L>R); Chronic hip pain (Left); Diabetic peripheral neuropathy ([place]); Chronic lumbar radicular pain (S1 Dermatome) (Bilateral) (L>R); Hypokalemia; Complete tear of the shoulder rotator cuff (Left); Chronic shoulder pain (Location of Primary Source of Pain) (Bilateral) (L>R); Diabetes ([place]); Chronic foot pain (Location of Secondary source of pain) (Bilateral) (L>R); Chronic hand pain (Location of Tertiary source of pain) (Bilateral) (R>L); Lumbar facet hypertrophy; Encounter for therapeutic drug level monitoring; Encounter for pain management planning; Low testosterone; Occipital pain (Right); Failed back surgical syndrome 2; Chronic left-sided low back pain with left-sided sciatica; Radicular pain of  shoulder (Right); Carpal tunnel syndrome (Bilateral); Chronic cervical radicular pain (Right); Abnormal MRI, lumbar spine; Sacroiliac pain; Neurogenic pain; Cervical spondylosis; Cervical facet arthropathy; Chronic neck pain (Bilateral) (R>L); Cervical facet syndrome (Bilateral) (R>L); Chronic upper extremity pain (Bilateral) (R>L); Cervical foraminal stenosis;  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 Howard Murphy was last seen on Visit date not found. His primarily concern today is the Back Pain (lower); Shoulder Pain (bilateral); and Hand Pain (bilateral)  Pain Assessment: Self-Reported Pain Score: 5 /10             Reported level is compatible with observation.       Pain Type: Chronic pain Pain Location: Back Pain Orientation: Lower Pain Descriptors / Indicators: Aching Pain Frequency: Constant  According to the patient his primary pain is his lower back. He states that this in the middle of his back and does not radiate. He is status post back surgery in January 2018 by Dr. Roseanne Reno. He does admits the surgery was more for leg pain. He states that the surgery was not effective. He denies physical therapy post surgery. He had images in [place] few months ago. She states that he is back now for pain management. His previous patient of Dr. Lowella Dandy status post lumbar facet block which he states was ineffective.  His second area of pain is in his shoulders. He admits that the right is greater than the left. He does have numbness tingling and weakness in the middle finger on the right.  He is status post right rotator cuff  repair with revision. He admits that he also had left rotator cuff repair. He denies any physical therapy post surgery. He denies any recent images.  He admits that he has pain in his hands. He states he was diagnosed with arthritis. He states right being greater than the left. He denies any interventional therapy, physical therapy or recent images.  He admits that he does have burning in both feet he feels like this secondary to his diabetes. He denies previous EMG.   Laboratory Chemistry  Inflammation Markers Lab Results  Component Value Date   CRP 0.6 09/05/2015   ESRSEDRATE 1 09/05/2015   (CRP: Acute Phase) (ESR: Chronic Phase) Renal Function Markers Lab Results  Component Value Date   BUN 13 12/01/2016   CREATININE 0.92 12/01/2016   GFRAA >60 12/01/2016   GFRNONAA >60 12/01/2016   Hepatic Function Markers Lab Results  Component Value Date   AST 34 12/01/2016   ALT 27 12/01/2016   ALBUMIN 4.2 12/01/2016   ALKPHOS 56 12/01/2016   Electrolytes Lab Results  Component Value Date   NA 133 (L) 12/01/2016   K 3.3 (L) 12/01/2016   CL 100 (L) 12/01/2016   CALCIUM 9.2 12/01/2016   MG 2.1 09/05/2015   Neuropathy Markers No results found for: [id number] Bone Pathology Markers Lab Results  Component Value Date   ALKPHOS 56 12/01/2016   CALCIUM 9.2 12/01/2016   Coagulation Parameters Lab Results  Component Value Date   PLT 239 12/01/2016   Cardiovascular Markers Lab Results  Component Value Date   HGB 18.1 (H) 12/01/2016   HCT 53.8 (H) 12/01/2016   Note: Lab results reviewed.  Recent Diagnostic Imaging Review  Ct Head Wo Contrast  Result Date: 08/08/2016 CLINICAL DATA:  Dizziness with movement EXAM: CT HEAD WITHOUT CONTRAST TECHNIQUE: Contiguous axial images were obtained from the base of the skull through the vertex without intravenous contrast. COMPARISON:  CT brain scan of 12/17/2012 FINDINGS: Brain: The ventricular system  is stable in size and configuration and the septum remains midline in position. The fourth ventricle and basilar cisterns are unremarkable. No hemorrhage, mass lesion, or acute infarction is seen. Vascular: No vascular abnormality is noted on these unenhanced images. Skull: On bone window images, no calvarial abnormality is seen. Sinuses/Orbits: There is a small retention cyst in the floor of the right maxillary sinus but there is no current evidence of sinusitis. Other: None. IMPRESSION: Negative unenhanced CT of the brain. Electronically Signed   By: Ivar Drape M.D.   On: 08/08/2016 13:27   Cervical Imaging:  Cervical CT wo contrast:  Results for orders placed during the hospital encounter of 11/03/15  CT Cervical Spine Wo Contrast   Narrative CLINICAL DATA:  Chronic radicular neck pain with BILATERAL upper extremity weakness and numbness.  EXAM: CT CERVICAL SPINE WITHOUT CONTRAST  TECHNIQUE: Multidetector CT imaging of the cervical spine was performed without intravenous contrast. Multiplanar CT image reconstructions were also generated.  COMPARISON:  12/17/2012.  FINDINGS: Alignment is anatomic, although there is some straightening of the normal cervical lordosis. No worrisome osseous lesions. Nuchal ligament calcification. No neck masses. Lung apices not well visualized. No visible atherosclerotic calcification.  The individual disc spaces were examined as follows:  C2-3:  Unremarkable disc space.  Mild facet arthropathy.  C3-4: Unremarkable disc space. Mild facet arthropathy and uncinate spurring on the RIGHT could affect the exiting nerve root.  C4-5: Severe disc space narrowing with central disc osteophyte complex and BILATERAL uncinate spurring, RIGHT greater than  LEFT. Mild stenosis. RIGHT greater than LEFT C5 nerve root impingement is likely.  C5-6: Mild disc space narrowing. BILATERAL uncinate spurring without facet disease. Annular bulging in the midline. Mild  stenosis. Either C6 nerve root could be affected.  C6-7: Moderate disc space narrowing. BILATERAL uncinate spurring and central disc calcification. Foraminal narrowing could affect either C7 nerve root.  C7-T1:  Unremarkable.  IMPRESSION: Multilevel spondylosis as described. When compared with 2014, similar appearance.   Electronically Signed   By: Staci Righter M.D.   On: 11/03/2015 09:37      Shoulder Imaging:   Shoulder-R MR wo contrast:  Results for orders placed in visit on 08/10/14  MR Shoulder Right Wo Contrast   Narrative * PRIOR REPORT IMPORTED FROM AN EXTERNAL SYSTEM *   CLINICAL DATA:  Right upper arm and shoulder pain when lifting and  elevating the extremity for 2 months.   EXAM:  MRI OF THE RIGHT SHOULDER WITHOUT CONTRAST   TECHNIQUE:  Multiplanar, multisequence MR imaging of the shoulder was performed.  No intravenous contrast was administered.   COMPARISON:  None.   FINDINGS:  Rotator cuff: Insertional tear of the distal supraspinatus tendon  with equivocal extension to the bursal surface on images 9 and 13 of  series 4. I do not see definite extension to the articular surface.  Accordingly this is considered a partial tear. Adjacent Mild  supraspinatus tendinopathy. Partial-thickness intrasubstance tearing  in the distal infraspinatus tendon. Subscapularis and teres minor  intact.   Muscles:  Unremarkable   Biceps long head:  Mild tendinopathy   Acromioclavicular Joint: Moderate degenerative spurring with edema  within and along the [facility] joint. Trace fluid in the subacromial  subdeltoid bursa. Inferior spurring from the [facility] joint impinges on  the supraspinatus muscle.   Glenohumeral Joint: Mild degenerative chondral thinning.   Labrum: Indistinct increased signal in the superior labrum, images  10 through 12 of series 4, suspicious for nondisplaced SLAP tear.   Bones: Degenerative subcortical cyst formation posteriorly and in  the central  portion of the greater tuberosity.   IMPRESSION:  1. Partial thickness insertional tear of the distal supraspinatus  tendon, equivocal extension to the bursal surface without definite  articular surface extension. There is some adjacent intrasubstance  tearing in the infraspinatus tendon along with associated  tendinopathy in the supraspinatus and infraspinatus.  2. Trace subacromial subdeltoid bursitis.  3. Moderate degeneration of the [facility] joint, with AC joint spurring  indenting the top of the supraspinatus muscle.  4. Mild degenerative chondral thinning in the glenohumeral joint.  5. Faintly increased linear signal along the base of the superior  labrum, suspicious for a nondisplaced labral tear. This could be  confirmed with MR arthrography if clinically warranted.    Electronically Signed    By: Van Clines M.D.    On: 08/10/2014 13:11       Shoulder-L DG:  Results for orders placed during the hospital encounter of 05/06/15  DG Shoulder Left   Narrative CLINICAL DATA:  Left shoulder pain, fell out of chair tonight  EXAM: LEFT SHOULDER - 2+ VIEW  COMPARISON:  None.  FINDINGS: Four views of the left shoulder submitted. No acute fracture or subluxation. Mild degenerative changes AC joint. Glenohumeral joint is preserved.  IMPRESSION: No acute fracture or subluxation. Mild degenerative changes acromioclavicular joint.   Electronically Signed   By: Lahoma Crocker M.D.   On: 05/06/2015 23:46     Lumbosacral Imaging: Lumbar MR wo contrast:  Results for orders placed during the hospital encounter of 11/03/15  MR Lumbar Spine Wo Contrast   Narrative CLINICAL DATA:  Chronic radicular back pain. Left leg pain and numbness.  EXAM: MRI LUMBAR SPINE WITHOUT CONTRAST  TECHNIQUE: Multiplanar, multisequence MR imaging of the lumbar spine was performed. No intravenous contrast was administered.  COMPARISON:  Lumbar radiographs 09/05/2015  FINDINGS: Segmentation:   Normal lumbar segmentation.  Lowest disc space L5-S1.  Alignment:  Slight anterior slip L4-5.  Remaining alignment normal.  Vertebrae:  Negative for fracture or mass lesion.  Conus medullaris: Extends to the T12-L1 level and appears normal.  Paraspinal and other soft tissues: Paraspinous muscles normal. No retroperitoneal mass or adenopathy. No aortic aneurysm.  Disc levels:  L1-2: Small left lateral disc and osteophyte complex with mild displacement of the left L1 nerve root. No significant spinal stenosis  L2-3: Small left lateral disc and osteophyte complex with mild displacement of the left L2 nerve root. Mild facet degeneration. Mild spinal stenosis.  L3-4: Diffuse bulging of the disc with bilateral facet hypertrophy. Moderate spinal stenosis.  L4-5: 2 mm anterior slip. Diffuse disc bulging. Moderate facet hypertrophy bilaterally with moderate spinal stenosis. Subarticular and foraminal stenosis on the left.  6 mm synovial cyst in the posterior spinal canal on the left just above the L5-S1 disc space. This may be arising from the L4-5 facet joint. This appears to be causing impingement of the left S1 nerve root.  L5-S1:  Normal disc space.  No significant facet degeneration.  IMPRESSION: Small left lateral disc and osteophyte complex at L1-2 and L2-3  Moderate spinal stenosis L3-4  Moderate spinal stenosis L4-5. Subarticular and foraminal stenosis on the left.  6 mm synovial cyst on the left between the L4-5 and L5-1 disc spaces. This is causing impingement of the left S1 nerve root. This is most likely arising from the L4-5 facet joint on left given the amount of degenerative change and fluid in the facet joint. The left L5-S1 facet joint is not degenerated.   Electronically Signed   By: Franchot Gallo M.D.   On: 11/03/2015 08:55    Lumbar MR wo contrast:  Results for orders placed in visit on 01/27/09  MR L Spine Ltd W/O Cm   Narrative * PRIOR REPORT  IMPORTED FROM AN EXTERNAL SYSTEM *   PRIOR REPORT IMPORTED FROM THE [facility] WORKFLOW SYSTEM   REASON FOR EXAM:    LOW BACK PAIN W RADICULITIS  COMMENTS:   PROCEDURE:     MR  - MR LUMBAR SPINE WO CONTRAST  - Jan 27 2009  5:00PM   RESULT:     Lumbar MRI dated 01/27/2009   Technique: Multiplanar multisequence imaging lumbar spine was obtained  without administration of gadolinium.   Findings: The conus medullaris  terminates at T12-L1 level. The cauda  equina  demonstrate no evidence of clumping or thickening.   At the T12-L1 level, L1-L2 level, demonstrate no evidence of thecal sac  stenosis nor neuroforaminal narrowing.   At the L2-L3 disc space level a mild broad based disc bulge is appreciated  demonstrating lateralization to the left. There is no evidence of thecal  sac  stenosis though there is mild neuroforaminal narrowing on the left with  possible exiting nerve root compromise.   At the L3-L4 level broad-based disc bulge is appreciated causing partial  effacement of the anterior CSF space. There is mild thecal sac narrowing  without evidence of neuroforaminal foraminal narrowing.   The L4-L5 level  a lobulated disc bulge is appreciated demonstrating  lateralization to the left. This contributes to Central neuroforaminal  narrowing is also secondary to an element of facet hypertrophy. Mass  effect  upon the central exiting nerve root on the left is appreciated with  findings  concerning for exiting nerve root compromise and mild compression  centrally.  There is mild thecal sac stenosis at this level which primarily from a  hypertrophic spurring of the left lamina and  facet.   At the L5-S1 level a mild broad based disc bulge is appreciated  demonstrating lateralization to the left. This causes encroachment upon  the  central exiting nerve root also exiting nerve root compromise.   Evaluation of the osseous structures demonstrates no T1 or T2 signal  abnormalities.    IMPRESSION:      Areas of mild disc bulges as described above with  associated regions of neuroforaminal narrowing. There are areas concerning  for exiting nerve root compromise centrally at L5-S1 and centrally at the  L4-L5 level. These findings are on the left. Uncal correlation recommended  and if warranted surgical consultation.       Lumbar MR w/wo contrast:  Results for orders placed during the hospital encounter of 05/01/16  MR LUMBAR SPINE W WO CONTRAST   Narrative CLINICAL DATA:  61 year old male with several falls in the last 3 months. Prior surgery. Lumbar back pain worse on the left radiating down the leg to the foot. Weakness. Lumbar spine synovial cyst. Initial encounter.  EXAM: MRI LUMBAR SPINE WITHOUT AND WITH CONTRAST  TECHNIQUE: Multiplanar and multiecho pulse sequences of the lumbar spine were obtained without and with intravenous contrast.  CONTRAST:  49m MULTIHANCE GADOBENATE DIMEGLUMINE 529 MG/ML IV SOLN  COMPARISON:  [facility] Lumbar radiographs 03/28/2016. Lumbar MRI 11/03/2015 and earlier.  FINDINGS: Segmentation: Transitional lumbosacral anatomy. Judging from the recent radiographs it appears that posterior and interbody fusion has taken place at L5-S1, with a partially lumbarized S1 level. However, differs from the numbering system on the 11/03/2015 MRI whereby the lowest partially lumbarized vertebra was designated L5. That same designation was used on intraoperative images [phone] the fused levels were designated L4-L5.  Therefore the designation of prior fusion at L4-L5 will be continued, such that the same numbering system is used today as on the 11/03/2015 MRI, absent ribs are designated at T12, and there are full size ribs at T11. Correlation with radiographs is recommended prior to any operative intervention.  Alignment: Stable vertebral height and alignment from the October radiographs.  Vertebrae: Hardware  susceptibility artifact at L4-L5. Up to mild inferior endplate marrow edema at L4. Normal bone marrow signal elsewhere. No acute osseous abnormality identified.  Conus medullaris: Extends to the T12 level and appears normal.  Paraspinal and other soft tissues: Negative visualized abdominal viscera. Postoperative changes to the posterior paraspinal soft tissues at L4 and L5. Suggestion of a small postoperative fluid collection in the laminectomy space (series 5, image 26) with no definite mass effect on the posterior thecal sac.  Disc levels:  T11-T12:  Negative.  T12-L1:  Negative.  L1-L2: Stable left eccentric mild circumferential disc bulge with broad-based left extra foraminal and far lateral component of disc. Stable mild facet hypertrophy.  L2-L3: Stable left eccentric circumferential disc bulge. Stable mild to moderate facet hypertrophy.  L3-L4: Circumferential disc bulge with broad-based posterior component. Chronic but progressed and now severe facet hypertrophy with new facet joint fluid. Ligament flavum hypertrophy me might also have progressed. Some  susceptibility artifact at this level but evidence of progressed multifactorial spinal stenosis, mild to moderate (series 5, image 20 today versus series 5, image 23 previously). No foraminal stenosis.  L4-L5: Sequelae of decompression and fusion. Improved thecal sac and lateral recess patency, although detail is partially obscured by hardware artifact (series 5, image 25). Mild endplate spurring. No foraminal stenosis.  L5-S1: Transitional level. Mild facet hypertrophy appears somewhat increased. Previously seen left side synovial cyst at this level does not persist. Improved left lateral recess patency at the level of the cyst. Mild left lateral recess effacement at the level of the disc space is stable (series 5, image 31). Mild endplate spurring. No convincing spinal or foraminal stenosis.  IMPRESSION: 1.  Transitional lumbosacral anatomy. The same numbering system is used here as on the preoperative MRI 11/03/2015 and intraoperative images 01/24/16 designating the interval surgery at L4-L5. Correlation with radiographs is recommended prior to any operative intervention. 2. Decompression and fusion at L4-L5 with improved thecal sac and lateral recess patency. Resection of the left side synovial cyst at L5-S1 with improved left lateral recess patency. 3. Adjacent segment disease at L3-L4 with progressed multifactorial mild to moderate spinal stenosis. New facet joint fluid at that level. 4. L1-L2 and L2-L3 levels are stable.   Electronically Signed   By: Genevie Ann M.D.   On: 05/01/2016 12:51     Lumbar DG 2-3 views:  Results for orders placed during the hospital encounter of 08/03/16  DG Lumbar Spine 2-3 Views   Narrative CLINICAL DATA:  Intraoperative imaging for lumbar fusion.  EXAM: LUMBAR SPINE - 2-3 VIEW; DG C-ARM 61-120 MIN  COMPARISON:  MRI lumbar spine 05/01/2016.  FINDINGS: 2 fluoroscopic intraoperative spot views demonstrate extension of previously seen L4-5 fusion to the L3-4 level. No acute abnormality.  IMPRESSION: Extension of L4-5 fusion to include L3-4.  No acute finding.   Electronically Signed   By: Inge Rise M.D.   On: 08/03/2016 19:32    Lumbar DG Bending views:  Results for orders placed during the hospital encounter of 09/05/15  DG Lumbar Spine Complete W/Bend   Narrative CLINICAL DATA:  61 year old male with chronic lumbar back pain. Symptoms increasing for 2 years. No recent injury. Pain radiating to the left hip. Initial encounter.  EXAM: LUMBAR SPINE - COMPLETE WITH BENDING VIEWS  COMPARISON:  Lumbar MRI 01/27/2009. CT Abdomen and Pelvis 04/13/2015  FINDINGS: Partially lumbarized S1 level with bilateral sclerotic S1-S2 assimilation joints. Associated anterior superior SI joint osteophytosis, better demonstrated in 2016. Trace grade  1 anterolisthesis at L5-S1. Moderate to severe chronic facet degeneration at that level. Moderate facet degeneration at L4-L5 and L3-L4. No pars fracture. Relatively preserved lumbar disc spaces. Visible lower thoracic levels appear intact.  Lateral views in flexion and extension. No abnormal motion identified in the lumbar spine.  IMPRESSION: 1. Moderate to severe lower lumbar facet degeneration, maximal at L5-S1 where there is stable trace anterolisthesis. 2. Moderate S1-S2 and anterior superior SI joint degeneration, in part due to partially lumbarized S1 level.   Electronically Signed   By: Genevie Ann M.D.   On: 09/05/2015 15:42    Hip Imaging:  Hip-L DG 2-3 views:  Results for orders placed during the hospital encounter of 09/05/15  DG HIP UNILAT W OR W/O PELVIS 2-3 VIEWS LEFT   Narrative CLINICAL DATA:  Chronic back pain, injury were 20 years ago, increased pain in last 2 years, LEFT hip and low back pain, SI joint pain, history hypertension,  diabetes mellitus  EXAM: DG HIP (WITH OR WITHOUT PELVIS) 2-3V LEFT  COMPARISON:  None  FINDINGS: Osseous mineralization normal.  Hip and SI joint spaces symmetric and preserved.  No acute fracture, dislocation or bone destruction.  Few scattered pelvic phleboliths.  Disc space narrowing L4-L5, questionably L5-S1.  IMPRESSION: No significant LEFT hip abnormalities.  Degenerative disc disease changes L4-L5.   Electronically Signed   By: Lavonia Dana M.D.   On: 09/05/2015 15:43    Note: Imaging results reviewed. Copy of results provided to patient  Meds  The patient has a current medication list which includes the following prescription(s): amlodipine, celecoxib, chlorthalidone, lisinopril, omeprazole, rosuvastatin, sitagliptin, testosterone cypionate, gabapentin, methocarbamol, oxycodone-acetaminophen, and promethazine.  Current Outpatient Prescriptions on File Prior to Visit  Medication Sig  . amLODipine (NORVASC)  5 MG tablet Take 1 tablet (5 mg total) by mouth daily.  . celecoxib (CELEBREX) 200 MG capsule Take 1 capsule (200 mg total) by mouth every 12 (twelve) hours.  . chlorthalidone (HYGROTON) 50 MG tablet Take 50 mg by mouth daily.   Marland Kitchen lisinopril (PRINIVIL,ZESTRIL) 20 MG tablet Take 1 tablet (20 mg total) by mouth daily. Reported on 12/08/2015  . omeprazole (PRILOSEC) 40 MG capsule Take 40 mg by mouth 2 (two) times daily.  . rosuvastatin (CRESTOR) 40 MG tablet Take 1 tablet (40 mg total) by mouth at bedtime.  . sitaGLIPtin (JANUVIA) 50 MG tablet Take 1 tablet (50 mg total) by mouth daily.  Marland Kitchen testosterone cypionate (DEPOTESTOSTERONE CYPIONATE) 200 MG/ML injection Inject 200 mg into the muscle every 14 (fourteen) days.  Marland Kitchen gabapentin (NEURONTIN) 300 MG capsule Take 1 capsule (300 mg total) by mouth 3 (three) times daily. (Patient not taking: Reported on 11/29/2016)  . methocarbamol (ROBAXIN) 500 MG tablet Take 1 tablet (500 mg total) by mouth every 6 (six) hours as needed for muscle spasms. (Patient not taking: Reported on 11/29/2016)  . oxyCODONE-acetaminophen (PERCOCET) 10-325 MG tablet Take 1 tablet by mouth every 4 (four) hours as needed for pain. (Patient not taking: Reported on 11/29/2016)  . promethazine (PHENERGAN) 25 MG tablet Take 1 tablet (25 mg total) by mouth every 6 (six) hours as needed for nausea or vomiting. (Patient not taking: Reported on 11/29/2016)   No current facility-administered medications on file prior to visit.    ROS  Constitutional: Denies any fever or chills Gastrointestinal: No reported hemesis, hematochezia, vomiting, or acute GI distress Musculoskeletal: Denies any acute onset joint swelling, redness, loss of ROM, or weakness Neurological: No reported episodes of acute onset apraxia, aphasia, dysarthria, agnosia, amnesia, paralysis, loss of coordination, or loss of consciousness  Allergies  Mr. Mortellaro has No Known Allergies.  [place]  Drug: Mr. Dozal  reports that he  does not use drugs. Alcohol:  reports that he does not drink alcohol. Tobacco:  reports that he has never smoked. He quit smokeless tobacco use about 3 years ago. His smokeless tobacco use included Chew. Medical:  has a past medical history of Arthritis; Back ache (05/05/2013); Diabetes mellitus without complication ([place]); GERD (gastroesophageal reflux disease); Headache; Hyperlipemia; Hypertension; Kidney stones; Nephrolithiasis; and Shortness of breath dyspnea. Family: family history includes Cancer in his maternal grandmother, mother, sister, and sister; Heart disease in his father; Stroke in his father.  Past Surgical History:  Procedure Laterality Date  . BACK SURGERY  2015   x3  . CHOLECYSTECTOMY  2000  . COLONOSCOPY WITH PROPOFOL N/A 04/11/2015   Procedure: COLONOSCOPY WITH PROPOFOL;  Surgeon: Manya Silvas, MD;  Location:  [facility];  Service: Endoscopy;  Laterality: N/A;  . ESOPHAGOGASTRODUODENOSCOPY (EGD) WITH PROPOFOL N/A 04/11/2015   Procedure: ESOPHAGOGASTRODUODENOSCOPY (EGD) WITH PROPOFOL;  Surgeon: Manya Silvas, MD;  Location: [facility];  Service: Endoscopy;  Laterality: N/A;  . SHOULDER SURGERY Right 2016  . SHOULDER SURGERY Left 06/2015   Constitutional Exam  General appearance: Well nourished, well developed, and well hydrated. In no apparent acute distress Vitals:   11/29/16 1052 11/29/16 1054  BP: (!) 139/93 (!) 142/87  Pulse: 64   Resp: 16   Temp: 97.9 F (36.6 C)   TempSrc: Oral   SpO2: 96%   Weight: 197 lb (89.4 kg)   Height: '5\' 7"'  (1.702 m)    BMI Assessment: Estimated body mass index is 30.85 kg/m as calculated from the following:   Height as of this encounter: '5\' 7"'  (1.702 m).   Weight as of this encounter: 197 lb (89.4 kg).  BMI interpretation table: BMI level Category Range association with higher incidence of chronic pain  <18 kg/m2 Underweight   18.5-24.9 kg/m2 Ideal body weight   25-29.9 kg/m2 Overweight Increased incidence by  20%  30-34.9 kg/m2 Obese (Class I) Increased incidence by 68%  35-39.9 kg/m2 Severe obesity (Class II) Increased incidence by 136%  >40 kg/m2 Extreme obesity (Class III) Increased incidence by 254%   BMI Readings from Last 4 Encounters:  12/01/16 29.76 kg/m  11/29/16 30.85 kg/m  08/08/16 30.07 kg/m  08/03/16 32.81 kg/m   Wt Readings from Last 4 Encounters:  12/01/16 190 lb (86.2 kg)  11/29/16 197 lb (89.4 kg)  08/08/16 192 lb (87.1 kg)  08/03/16 209 lb 8 oz (95 kg)  Psych/Mental status: Alert, oriented x 3 (person, place, & time)       Eyes: PERLA Respiratory: No evidence of acute respiratory distress  Cervical Spine Exam  Inspection: No masses, redness, or swelling Alignment: Symmetrical Functional ROM: Unrestricted ROM      Stability: No instability detected Muscle strength & Tone: Functionally intact Sensory: Unimpaired Palpation: No palpable anomalies              Upper Extremity (UE) Exam    Side: Right upper extremity  Side: Left upper extremity  Inspection: No masses, redness, swelling, or asymmetry. No contractures  Inspection: No masses, redness, swelling, or asymmetry. No contractures  Functional ROM: Unrestricted ROM          Functional ROM: Unrestricted ROM          Muscle strength & Tone: Functionally intact  Muscle strength & Tone: Functionally intact  Sensory: Unimpaired  Sensory: Unimpaired  Palpation: No palpable anomalies              Palpation: No palpable anomalies              Specialized Test(s): Deferred         Specialized Test(s): Deferred          Thoracic Spine Exam  Inspection: No masses, redness, or swelling Alignment: Symmetrical Functional ROM: Unrestricted ROM Stability: No instability detected Sensory: Unimpaired Muscle strength & Tone: No palpable anomalies  Lumbar Spine Exam  Inspection: No masses, redness, or swelling Alignment: Symmetrical Functional ROM: Unrestricted ROM      Stability: No instability detected Muscle  strength & Tone: Functionally intact Sensory: Unimpaired Palpation: No palpable anomalies       Provocative Tests: Lumbar Hyperextension and rotation test: evaluation deferred today       Patrick's Maneuver: Positive for bilateral S-I arthralgia  Gait & Posture Assessment  Ambulation: Unassisted Gait: Relatively normal for age and body habitus Posture: WNL   Lower Extremity Exam    Side: Right lower extremity  Side: Left lower extremity  Inspection: No masses, redness, swelling, or asymmetry. No contractures  Inspection: No masses, redness, swelling, or asymmetry. No contractures  Functional ROM: Unrestricted ROM          Functional ROM: Unrestricted ROM          Muscle strength & Tone: Functionally intact  Muscle strength & Tone: Functionally intact  Sensory: Unimpaired  Sensory: Unimpaired  Palpation: No palpable anomalies  Palpation: No palpable anomalies   Assessment  Primary Diagnosis & Pertinent Problem List: The primary encounter diagnosis was Sacroiliac pain. Diagnoses of Lumbar facet hypertrophy, Lumbar spondylosis, Long term prescription opiate use, and Chronic shoulder pain (Location of Primary Source of Pain) (Bilateral) (L>R) were also pertinent to this visit.  Status Diagnosis  Controlled Controlled Controlled 1. Sacroiliac pain   2. Lumbar facet hypertrophy   3. Lumbar spondylosis   4. Long term prescription opiate use   5. Chronic shoulder pain (Location of Primary Source of Pain) (Bilateral) (L>R)     Problems updated and reviewed during this visit: Problem  Chronic Pain Syndrome  Cervical Spondylosis  Cervical facet arthropathy  Chronic neck pain (Bilateral) (R>L)  Cervical facet syndrome (Bilateral) (R>L)  Chronic upper extremity pain (Bilateral) (R>L)   Accompanied by bilateral upper extremity weakness and numbness.    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 Supraspinatus tendinopathy. Partial-thickness tear of the distal infraspinatus tendon. Biceps tendinopathy. Glenohumeral joint: Chondral thinning. Degenerative subcortical cyst in the posterior and central portion of the greater tuberosity. Acromioclavicular joint: Subacromial/subdeltoid bursa fluid. Acromioclavicular joint spurring impinges on supraspinatus muscle. DJD of acromioclavicular joint.   Osteoarthritis  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)  Lumbar foraminal stenosis (L4-5) (Left)  Grade 1 Anterolisthesis (2 mm) of L4 over L5.  Lumbar facet (L4-5) synovial cyst (6 mm) (Left)   Posterior spinal canal on the left causing impingement on the left S1 nerve root.   Sacroiliac Pain  Neurogenic Pain  Radicular pain of shoulder (Right)  Carpal tunnel syndrome (Bilateral)  Chronic cervical radicular pain (Right)  Chronic Left-Sided Low Back Pain With Left-Sided Sciatica  Chronic low back pain (Bilateral) (L>R)  Lumbar Spondylosis  Lumbar facet syndrome (Bilateral) (L>R)  Chronic hip pain (Left)  Diabetic Peripheral Neuropathy (Hcc)  Chronic lumbar radicular pain (S1 Dermatome) (Bilateral) (L>R)   The pain goes to the bottom of his feet, bilaterally.   Chronic shoulder pain (Location of Primary Source of Pain) (Bilateral) (L>R)  Diabetes (Hcc)  Chronic foot pain (Location of Secondary source of pain) (Bilateral) (L>R)   Pain is described to be in the bottom of his feet.   Chronic hand pain (Location of Tertiary source of pain) (Bilateral) (R>L)  Lumbar facet hypertrophy  Occipital pain (Right)  Failed back surgical syndrome 2  Chronic abdominal pain (RUQ)  Ra (Rheumatoid Arthritis) (Hcc)  Opiate use (60 MME/Day)  Long Term Prescription Opiate Use  Long Term Current Use of Opiate Analgesic  Spondylolisthesis At L3-L4 Level  Encounter for Medication Monitoring  Cervical foraminal stenosis   Right-sided at C3-4 affecting right C4 nerve root.  (Possible symptoms: Neck and shoulder pain on the right side) Bilateral (R>L) at C4-5 affecting C5 nerve root. (Possible symptoms: Bilateral shoulder and arm  pain, not going into the hand with weakness on elbow flexion and shoulder Adduction) Bilateral at C5-6 affecting C6 nerve root. (Possible symptoms: Bilateral thumb pain with weakness on elbow flexion and wrist extension) Bilateral at C6-7 affecting C7 nerve root. (Possible symptoms: Bilateral index and middle finger pain and/or numbness with weakness on elbow extension, wrist flexion, and finger extension)   Abnormal Mri, Lumbar Spine   CLINICAL DATA: Chronic radicular back pain. Left leg pain and numbness. EXAM: MRI LUMBAR SPINE WITHOUT CONTRAST TECHNIQUE: Multiplanar, multisequence MR imaging of the lumbar spine was performed. No intravenous contrast was administered. COMPARISON: Lumbar radiographs 09/05/2015 FINDINGS: Segmentation: Normal lumbar segmentation. Lowest disc space L5-S1. Alignment: Slight anterior slip L4-5. Remaining alignment normal. Vertebrae: Negative for fracture or mass lesion. Conus medullaris: Extends to the T12-L1 level and appears normal. Paraspinal and other soft tissues: Paraspinous muscles normal. No retroperitoneal mass or adenopathy. No aortic aneurysm. Disc levels: L1-2: Small left lateral disc and osteophyte complex with mild displacement of the left L1 nerve root. No significant spinal stenosis L2-3: Small left lateral disc and osteophyte complex with mild displacement of the left L2 nerve root. Mild facet degeneration. Mild spinal stenosis. L3-4: Diffuse bulging of the disc with bilateral facet hypertrophy. Moderate spinal stenosis. L4-5: 2 mm anterior slip. Diffuse disc bulging. Moderate facet hypertrophy bilaterally with moderate spinal stenosis. Subarticular and foraminal stenosis on the left. 6 mm synovial cyst in the posterior spinal canal on the left just above the L5-S1 disc space. This may be  arising from the L4-5 facet joint. This appears to be causing impingement of the left S1 nerve root. L5-S1: Normal disc space. No significant facet degeneration. IMPRESSION: Small left lateral disc and osteophyte complex at L1-2 and L2-3 Moderate spinal stenosis L3-4 Moderate spinal stenosis L4-5. Subarticular and foraminal stenosis on the left. 6 mm synovial cyst on the left between the L4-5 and L5-1 disc spaces. This is causing impingement of the left S1 nerve root. This is most likely arising from the L4-5 facet joint on left given the amount of degenerative change and fluid in the facet joint. The left L5-S1 facet joint is not degenerated.   Hypokalemia  Encounter for Therapeutic Drug Level Monitoring  Encounter for Pain Management Planning  Low Testosterone  Hypertriglyceridemia  Type 2 Diabetes Mellitus With Peripheral Neuropathy (Hcc)  Chest Pain  Complete tear of the shoulder rotator cuff (Left)  Complete Tear of Left Rotator Cuff  Sob (Shortness of Breath)  Elevated hemoglobin (HCC)  History of lumbar facet Synovial cyst, surgically removed.  Benign Fibroma of Prostate  Gerd (Gastroesophageal Reflux Disease)  Benign Prostatic Hyperplasia  Hypertension  Hyperlipidemia   Plan of Care  Pharmacotherapy (Medications Ordered): No orders of the defined types were placed in this encounter.  New Prescriptions   No medications on file   Medications administered today: Mr. Rosenberger had no medications administered during this visit. Patient was advised that he will follow up with Dr. Lowella Dandy for treatment regimen.  Lab-work, procedure(s), and/or referral(s): Orders Placed This Encounter  Procedures  . DG Si Joints  . ToxASSURE Select 13 (MW), Urine  . Comprehensive metabolic panel  . C-reactive protein  . Sedimentation rate  . Magnesium  . 25-Hydroxyvitamin D Lcms D2+D3  . Vitamin B12   Imaging and/or referral(s): None  Interventional therapies: Planned, scheduled,  and/or pending:   Not at this time.   Considering:   1. Diagnostic bilateral cervical facet block 2. Possible bilateral cervical facet radiofrequency ablation  3.  Diagnostic right-sided greater occipital nerve block . 4. Possible right-sided greater occipital nerve radiofrequency ablation . 5. Possible right-sided peripheral nerve stimulator trial 6. Diagnostic right-sided cervical epidural steroid injection 7. Diagnostic bilateral intra-articular shoulder injection 8. Diagnostic bilateral suprascapular nerve block . 9. Possible bilateral suprascapular nerve radiofrequency ablation  10. Diagnostic bilateral sacroiliac joint block under fluoroscopic guidance, 11. Possible bilateral sacroiliac joint radiofrequency ablation . 12. Diagnostic bilateral lumbar facet block under fluoroscopic  13. Possible bilateral lumbar facet radiofrequency ablation  14. Diagnostic left L4-5 transforaminal epidural steroid injection  15. Diagnostic left-sided L4-5 lumbar epidural steroid injection   16. Diagnostic left intra-articular hip joint injection  17. Possible left hip joint radiofrequency ablation 18. Diagnostic bilateral median nerve block (carpal tunnel block) 19. Diagnostic right-sided celiac plexus block under fluoroscopic    Palliative PRN treatment(s):   1. Diagnostic bilateral cervical facet block 2. Diagnostic right-sided greater occipital nerve block  3. Diagnostic right-sided cervical epidural steroid injection  4. Diagnostic bilateral intra-articular shoulder injection  5. Diagnostic bilateral suprascapular nerve block  6. Diagnostic bilateral sacroiliac joint block  7. Diagnostic bilateral lumbar facet block  8. Diagnostic left L4-5 transforaminal epidural steroid injection 9. Diagnostic left-sided L4-5 lumbar epidural steroid injection  10. Diagnostic left intra-articular hip joint injection    Provider-requested follow-up: Return in about 2 weeks (around 12/13/2016) for w/ Dr.  Dossie Arbour, 2nd Visit (plan).  Future Appointments Date Time Provider [place]  12/20/2016 8:45 AM Milinda Pointer, MD [facility] None   Primary Care Physician: [facility] [place] Location: [facility] Facility Note by: Vevelyn Francois NP Date: 11/29/2016; Time: 11:50 AM  Pain Score Disclaimer: We use the NRS-11 scale. This is a self-reported, subjective measurement of pain severity with only modest accuracy. It is used primarily to identify changes within a particular patient. It must be understood that outpatient pain scales are significantly less accurate that those used for research, where they can be applied under ideal controlled circumstances with minimal exposure to variables. In reality, the score is likely to be a combination of pain intensity and pain affect, where pain affect describes the degree of emotional arousal or changes in action readiness caused by the sensory experience of pain. Factors such as social and work situation, setting, emotional state, anxiety levels, expectation, and prior pain experience may influence pain perception and show large inter-individual differences that may also be affected by time variables.  Patient instructions provided during this appointment: Patient Instructions  Please get your x-rays done as soon as possible

## 2016-12-01 ENCOUNTER — Emergency Department: Payer: Medicare Other

## 2016-12-01 ENCOUNTER — Emergency Department
Admission: EM | Admit: 2016-12-01 | Discharge: 2016-12-01 | Disposition: A | Discharge status: Home / Self Care | Payer: Medicare Other | Attending: Emergency Medicine | Admitting: Emergency Medicine

## 2016-12-01 ENCOUNTER — Encounter: Payer: Self-pay | Admitting: Emergency Medicine

## 2016-12-01 DIAGNOSIS — Z87891 Personal history of nicotine dependence: Secondary | ICD-10-CM | POA: Insufficient documentation

## 2016-12-01 DIAGNOSIS — M1990 Unspecified osteoarthritis, unspecified site: Secondary | ICD-10-CM | POA: Diagnosis not present

## 2016-12-01 DIAGNOSIS — R109 Unspecified abdominal pain: Secondary | ICD-10-CM | POA: Insufficient documentation

## 2016-12-01 DIAGNOSIS — M25552 Pain in left hip: Secondary | ICD-10-CM | POA: Diagnosis not present

## 2016-12-01 DIAGNOSIS — Z7984 Long term (current) use of oral hypoglycemic drugs: Secondary | ICD-10-CM | POA: Insufficient documentation

## 2016-12-01 DIAGNOSIS — E1140 Type 2 diabetes mellitus with diabetic neuropathy, unspecified: Secondary | ICD-10-CM | POA: Diagnosis not present

## 2016-12-01 DIAGNOSIS — E785 Hyperlipidemia, unspecified: Secondary | ICD-10-CM | POA: Diagnosis not present

## 2016-12-01 DIAGNOSIS — G8929 Other chronic pain: Secondary | ICD-10-CM | POA: Diagnosis not present

## 2016-12-01 DIAGNOSIS — I10 Essential (primary) hypertension: Secondary | ICD-10-CM | POA: Insufficient documentation

## 2016-12-01 DIAGNOSIS — Z79891 Long term (current) use of opiate analgesic: Secondary | ICD-10-CM | POA: Diagnosis not present

## 2016-12-01 DIAGNOSIS — R110 Nausea: Secondary | ICD-10-CM | POA: Insufficient documentation

## 2016-12-01 DIAGNOSIS — Z9049 Acquired absence of other specified parts of digestive tract: Secondary | ICD-10-CM | POA: Diagnosis not present

## 2016-12-01 DIAGNOSIS — E119 Type 2 diabetes mellitus without complications: Secondary | ICD-10-CM | POA: Diagnosis not present

## 2016-12-01 DIAGNOSIS — N200 Calculus of kidney: Secondary | ICD-10-CM | POA: Diagnosis not present

## 2016-12-01 DIAGNOSIS — Z79899 Other long term (current) drug therapy: Secondary | ICD-10-CM | POA: Diagnosis not present

## 2016-12-01 DIAGNOSIS — M545 Low back pain: Secondary | ICD-10-CM | POA: Diagnosis not present

## 2016-12-01 DIAGNOSIS — K219 Gastro-esophageal reflux disease without esophagitis: Secondary | ICD-10-CM | POA: Diagnosis not present

## 2016-12-01 DIAGNOSIS — M549 Dorsalgia, unspecified: Secondary | ICD-10-CM | POA: Diagnosis not present

## 2016-12-01 LAB — URINALYSIS, COMPLETE (UACMP) WITH MICROSCOPIC
BILIRUBIN URINE: NEGATIVE
Bacteria, UA: NONE SEEN
Glucose, UA: 50 mg/dL — AB
HGB URINE DIPSTICK: NEGATIVE
Ketones, ur: NEGATIVE mg/dL
LEUKOCYTES UA: NEGATIVE
NITRITE: NEGATIVE
PH: 5 (ref 5.0–8.0)
Protein, ur: NEGATIVE mg/dL
RBC / HPF: NONE SEEN RBC/hpf (ref 0–5)
SPECIFIC GRAVITY, URINE: 1.013 (ref 1.005–1.030)
Squamous Epithelial / LPF: NONE SEEN

## 2016-12-01 LAB — COMPREHENSIVE METABOLIC PANEL
ALT: 27 U/L (ref 17–63)
AST: 34 U/L (ref 15–41)
Albumin: 4.2 g/dL (ref 3.5–5.0)
Alkaline Phosphatase: 56 U/L (ref 38–126)
Anion gap: 9 (ref 5–15)
BUN: 13 mg/dL (ref 6–20)
CHLORIDE: 100 mmol/L — AB (ref 101–111)
CO2: 24 mmol/L (ref 22–32)
CREATININE: 0.92 mg/dL (ref 0.61–1.24)
Calcium: 9.2 mg/dL (ref 8.9–10.3)
Glucose, Bld: 203 mg/dL — ABNORMAL HIGH (ref 65–99)
POTASSIUM: 3.3 mmol/L — AB (ref 3.5–5.1)
Sodium: 133 mmol/L — ABNORMAL LOW (ref 135–145)
Total Bilirubin: 1.1 mg/dL (ref 0.3–1.2)
Total Protein: 7.6 g/dL (ref 6.5–8.1)

## 2016-12-01 LAB — CBC
HCT: 53.8 % — ABNORMAL HIGH (ref 40.0–52.0)
Hemoglobin: 18.1 g/dL — ABNORMAL HIGH (ref 13.0–18.0)
MCH: 29.7 pg (ref 26.0–34.0)
MCHC: 33.7 g/dL (ref 32.0–36.0)
MCV: 88.2 fL (ref 80.0–100.0)
PLATELETS: 239 10*3/uL (ref 150–440)
RBC: 6.1 MIL/uL — ABNORMAL HIGH (ref 4.40–5.90)
RDW: 15.3 % — AB (ref 11.5–14.5)
WBC: 8.8 10*3/uL (ref 3.8–10.6)

## 2016-12-01 MED ORDER — ONDANSETRON HCL 4 MG/2ML IJ SOLN
4.0000 mg | Freq: Once | INTRAMUSCULAR | Status: AC
  Administered 2016-12-01: 4 mg via INTRAVENOUS
  Filled 2016-12-01: qty 2

## 2016-12-01 MED ORDER — KETOROLAC TROMETHAMINE 30 MG/ML IJ SOLN
30.0000 mg | Freq: Once | INTRAMUSCULAR | Status: AC
  Administered 2016-12-01: 30 mg via INTRAVENOUS
  Filled 2016-12-01: qty 1

## 2016-12-01 MED ORDER — FENTANYL CITRATE (PF) 100 MCG/2ML IJ SOLN
50.0000 ug | Freq: Once | INTRAMUSCULAR | Status: AC
  Administered 2016-12-01: 50 ug via INTRAVENOUS
  Filled 2016-12-01: qty 2

## 2016-12-01 MED ORDER — SODIUM CHLORIDE 0.9 % IV BOLUS (SEPSIS)
1000.0000 mL | Freq: Once | INTRAVENOUS | Status: AC
  Administered 2016-12-01: 1000 mL via INTRAVENOUS

## 2016-12-01 NOTE — ED Provider Notes (Signed)
[facility] Emergency Department Provider Note  Time seen: 8:18 AM  I have reviewed the triage vital signs and the nursing notes.   HISTORY  Chief Complaint Flank Pain    HPI Howard Murphy is a 61 y.o. male with a past medical history of hypertension, hyperlipidemia, kidney stones, presents to the emergency department for left flank pain. According to the patient for the past 2 days he has been experiencing sharp pains in his left side with nausea. Denies any vomiting. He does state occasional loose stool. Has a history of kidney stones to which this feels identical. Has never required a lithotripsy or lasering procedure. Denies any fever. Describes the pain as sharp moderate wrapping around the left side into his left back. Denies hematuria or dysuria.  Past Medical History:  Diagnosis Date  . 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  . Back ache 05/05/2013  . Diabetes mellitus without complication ([place])   . GERD (gastroesophageal reflux disease)   . Headache   . Hyperlipemia   . Hypertension   . Kidney stones   . Nephrolithiasis   . Shortness of breath dyspnea     Patient Active Problem List   Diagnosis Date Noted  . Spondylolisthesis at L3-L4 level 08/03/2016  . Chronic pain syndrome 07/09/2016  . Encounter for medication monitoring 02/24/2016  . Cervical spondylosis 12/17/2015  . Cervical facet arthropathy 12/17/2015  . Chronic neck pain (Bilateral) (R>L) 12/17/2015  . Cervical facet syndrome (Bilateral) (R>L) 12/17/2015  . Chronic upper extremity pain (Bilateral) (R>L) 12/17/2015  . Cervical foraminal stenosis 12/17/2015  .  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 Arthropathy of shoulder (Right) 12/17/2015  . Osteoarthritis 12/17/2015  . 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 Lumbar foraminal stenosis (L4-5) (Left) 12/17/2015  . Grade 1 Anterolisthesis (2 mm) of L4 over L5. 12/17/2015  . Lumbar facet  (L4-5) synovial cyst (6 mm) (Left) 12/17/2015  . Sacroiliac pain 12/08/2015  . Neurogenic pain 12/08/2015  . Abnormal MRI, lumbar spine 11/21/2015  . Radicular pain of shoulder (Right) 10/03/2015  . Carpal tunnel syndrome (Bilateral) 10/03/2015  . Chronic cervical radicular pain (Right) 10/03/2015  . Chronic left-sided low back pain with left-sided sciatica 09/06/2015  . Opiate use (60 MME/Day) 09/05/2015  . Long term prescription opiate use 09/05/2015  . Long term current use of opiate analgesic 09/05/2015  . Chronic low back pain (Bilateral) (L>R) 09/05/2015  . Lumbar spondylosis 09/05/2015  . Lumbar facet syndrome (Bilateral) (L>R) 09/05/2015  . Chronic hip pain (Left) 09/05/2015  . Diabetic peripheral neuropathy ([place]) 09/05/2015  . Chronic lumbar radicular pain (S1 Dermatome) (Bilateral) (L>R) 09/05/2015  . Hypokalemia 09/05/2015  . Chronic shoulder pain (Location of Primary Source of Pain) (Bilateral) (L>R) 09/05/2015  . Diabetes ([place]) 09/05/2015  . Chronic foot pain (Location of Secondary source of pain) (Bilateral) (L>R) 09/05/2015  . Chronic hand pain (Location of Tertiary source of pain) (Bilateral) (R>L) 09/05/2015  . Lumbar facet hypertrophy 09/05/2015  . Encounter for therapeutic drug level monitoring 09/05/2015  . Encounter for pain management planning 09/05/2015  . Low testosterone 09/05/2015  . Occipital pain (Right) 09/05/2015  . 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 Hypertriglyceridemia 07/26/2015  . Chronic abdominal pain (RUQ) 07/14/2015  . Type 2 diabetes mellitus with peripheral neuropathy ([place]) 07/14/2015  . Chest pain 07/14/2015  . Complete tear of the shoulder rotator cuff (Left) 05/10/2015  . Complete tear of left rotator cuff 05/10/2015  .  SOB (shortness of breath) 03/22/2015  . Elevated hemoglobin ([place]) 07/07/2013  . History of lumbar facet Synovial cyst, surgically removed. 05/29/2013    Class: History of  . Benign fibroma of prostate  05/05/2013  . GERD (gastroesophageal reflux disease) 05/05/2013  . Benign prostatic hyperplasia 05/05/2013  . Hypertension 05/08/2011  . Hyperlipidemia 05/08/2011  . RA (rheumatoid arthritis) ([place]) 05/08/2011    Past Surgical History:  Procedure Laterality Date  . BACK SURGERY  2015   x3  . CHOLECYSTECTOMY  2000  . COLONOSCOPY WITH PROPOFOL N/A 04/11/2015   Procedure: COLONOSCOPY WITH PROPOFOL;  Surgeon: Manya Silvas, MD;  Location: [facility] ENDOSCOPY;  Service: Endoscopy;  Laterality: N/A;  . ESOPHAGOGASTRODUODENOSCOPY (EGD) WITH PROPOFOL N/A 04/11/2015   Procedure: ESOPHAGOGASTRODUODENOSCOPY (EGD) WITH PROPOFOL;  Surgeon: Manya Silvas, MD;  Location: [facility] ENDOSCOPY;  Service: Endoscopy;  Laterality: N/A;  . SHOULDER SURGERY Right 2016  . SHOULDER SURGERY Left 06/2015    Prior to Admission medications   Medication Sig Start Date End Date Taking? Authorizing Provider  amLODipine (NORVASC) 5 MG tablet Take 1 tablet (5 mg total) by mouth daily. 04/13/16   Arnetha Courser, MD  celecoxib (CELEBREX) 200 MG capsule Take 1 capsule (200 mg total) by mouth every 12 (twelve) hours. 08/07/16   Costella, Vista Mink, PA-C  chlorthalidone (HYGROTON) 50 MG tablet Take 50 mg by mouth daily.  12/07/15   [provider]  gabapentin (NEURONTIN) 300 MG capsule Take 1 capsule (300 mg total) by mouth 3 (three) times daily. Patient not taking: Reported on 11/29/2016 08/07/16   Traci Sermon, PA-C  lisinopril (PRINIVIL,ZESTRIL) 20 MG tablet Take 1 tablet (20 mg total) by mouth daily. Reported on 12/08/2015 04/13/16   Arnetha Courser, MD  methocarbamol (ROBAXIN) 500 MG tablet Take 1 tablet (500 mg total) by mouth every 6 (six) hours as needed for muscle spasms. Patient not taking: Reported on 11/29/2016 08/07/16   Traci Sermon, PA-C  omeprazole (PRILOSEC) 40 MG capsule Take 40 mg by mouth 2 (two) times daily. 02/07/15   [provider]  oxyCODONE-acetaminophen (PERCOCET) 10-325 MG  tablet Take 1 tablet by mouth every 4 (four) hours as needed for pain. Patient not taking: Reported on 11/29/2016 01/25/16   Consuella Lose, MD  promethazine (PHENERGAN) 25 MG tablet Take 1 tablet (25 mg total) by mouth every 6 (six) hours as needed for nausea or vomiting. Patient not taking: Reported on 11/29/2016 08/08/16   Harvest Dark, MD  rosuvastatin (CRESTOR) 40 MG tablet Take 1 tablet (40 mg total) by mouth at bedtime. 02/27/16   Lada, Satira Anis, MD  sitaGLIPtin (JANUVIA) 50 MG tablet Take 1 tablet (50 mg total) by mouth daily. 04/13/16   Arnetha Courser, MD  testosterone cypionate (DEPOTESTOSTERONE CYPIONATE) 200 MG/ML injection Inject 200 mg into the muscle every 14 (fourteen) days. 05/12/12   [provider]    No Known Allergies  Family History  Problem Relation Age of Onset  . Cancer Mother        Breast CA  . Stroke Father   . Heart disease Father   . Cancer Sister   . Cancer Maternal Grandmother   . Cancer Sister   . Bladder Cancer Neg Hx   . Prostate cancer Neg Hx   . Kidney cancer Neg Hx     Social History Social History  Substance Use Topics  . Smoking status: Never Smoker  . Smokeless tobacco: Former Systems developer    Types: Loss adjuster, chartered  Quit date: 01/18/2013  . Alcohol use No     Comment: occasional    Review of Systems Constitutional: Negative for fever. Cardiovascular: Negative for chest pain. Respiratory: Negative for shortness of breath. Gastrointestinal: Left flank pain. Positive for nausea. Negative for vomiting or diarrhea Genitourinary: Negative for dysuria. Negative for Hematuria Musculoskeletal: Pain radiates to left back Neurological: Negative for headache All other ROS negative  ____________________________________________   PHYSICAL EXAM:  VITAL SIGNS: ED Triage Vitals  Enc Vitals Group     BP 12/01/16 0806 137/88     Pulse Rate 12/01/16 0806 72     Resp 12/01/16 0806 18     Temp 12/01/16 0806 97.6 F (36.4 C)     Temp Source  12/01/16 0806 Oral     SpO2 12/01/16 0806 100 %     Weight 12/01/16 0808 190 lb (86.2 kg)     Height 12/01/16 0808 5\' 7"  (1.702 m)     Head Circumference --      Peak Flow --      Pain Score 12/01/16 0806 7     Pain Loc --      Pain Edu? --      Excl. in [place]? --     Constitutional: Alert and oriented. Well appearing and in no distress. Eyes: Normal exam ENT   Head: Normocephalic and atraumatic   Mouth/Throat: Mucous membranes are moist. Cardiovascular: Normal rate, regular rhythm. No murmur Respiratory: Normal respiratory effort without tachypnea nor retractions. Breath sounds are clear  Gastrointestinal: Soft, slight left sided abdominal tenderness, no rebound or guarding. No distention. Musculoskeletal: Nontender with normal range of motion in all extremities.  Neurologic:  Normal speech and language. No gross focal neurologic deficits Skin:  Skin is warm, dry and intact.  Psychiatric: Mood and affect are normal.   ____________________________________________   RADIOLOGY  CT shows no acute process. 5 mm renal stone on the right side. No left-sided findings.  ____________________________________________   INITIAL IMPRESSION / ASSESSMENT AND PLAN / ED COURSE  Pertinent labs & imaging results that were available during my care of the patient were reviewed by me and considered in my medical decision making (see chart for details).  Patient presents to the emergency department with left flank pain. Patient has a history of kidney stones. Slight left-sided abdominal tenderness. We will check labs, IV hydrate, and treat with Toradol/fentanyl/Zofran, and obtain a CT renal scan. Patient agreeable to plan.  CT and labs are largely normal. Negative for ureterolithiasis. We will discharge the patient home with PCP follow-up. I discussed my normal abdominal pain return precautions.  ____________________________________________   FINAL CLINICAL IMPRESSION(S) / ED  DIAGNOSES  Left flank pain    Harvest Dark, MD 12/01/16 [id number]

## 2016-12-01 NOTE — ED Triage Notes (Signed)
L flank pain x 2 days, Hx kidney stones and feels like stone.

## 2016-12-01 NOTE — ED Notes (Signed)
Pt verbalized understanding of discharge instructions. NAD at this time. 

## 2016-12-03 NOTE — Progress Notes (Signed)
Results were reviewed and found to be: mildly abnormal  No acute injury or pathology identified  Review would suggest interventional pain management techniques may be of benefit 

## 2016-12-04 LAB — COMPREHENSIVE METABOLIC PANEL
ALT: 27 IU/L (ref 0–44)
AST: 26 IU/L (ref 0–40)
Albumin/Globulin Ratio: 1.5 (ref 1.2–2.2)
Albumin: 4.6 g/dL (ref 3.6–4.8)
Alkaline Phosphatase: 62 IU/L (ref 39–117)
BILIRUBIN TOTAL: 0.4 mg/dL (ref 0.0–1.2)
BUN/Creatinine Ratio: 12 (ref 10–24)
BUN: 12 mg/dL (ref 8–27)
CHLORIDE: 99 mmol/L (ref 96–106)
CO2: 22 mmol/L (ref 20–29)
Calcium: 9.9 mg/dL (ref 8.6–10.2)
Creatinine, Ser: 1 mg/dL (ref 0.76–1.27)
GFR calc Af Amer: 93 mL/min/{1.73_m2} (ref >59)
GFR calc non Af Amer: 81 mL/min/{1.73_m2} (ref >59)
GLUCOSE: 123 mg/dL — AB (ref 65–99)
Globulin, Total: 3.1 g/dL (ref 1.5–4.5)
Potassium: 3.9 mmol/L (ref 3.5–5.2)
Sodium: 138 mmol/L (ref 134–144)
Total Protein: 7.7 g/dL (ref 6.0–8.5)

## 2016-12-04 LAB — 25-HYDROXYVITAMIN D LCMS D2+D3
25-HYDROXY, VITAMIN D-3: 24 ng/mL
25-HYDROXY, VITAMIN D: 24 ng/mL — AB

## 2016-12-04 LAB — 25-HYDROXY VITAMIN D LCMS D2+D3: 25-Hydroxy, Vitamin D-2: <1 ng/mL

## 2016-12-04 LAB — C-REACTIVE PROTEIN: CRP: 1.8 mg/L (ref 0.0–4.9)

## 2016-12-04 LAB — MAGNESIUM: Magnesium: 2 mg/dL (ref 1.6–2.3)

## 2016-12-04 LAB — SEDIMENTATION RATE: SED RATE: 11 mm/h (ref 0–30)

## 2016-12-04 LAB — VITAMIN B12: Vitamin B-12: 574 pg/mL (ref 232–1245)

## 2016-12-05 LAB — TOXASSURE SELECT 13 (MW), URINE

## 2016-12-19 NOTE — Progress Notes (Signed)
Patient's Name: Howard Murphy  MRN: [id number]  Referring Provider: [facility]  DOB: September 24, 1955  PCP: [facility], [place]  DOS: 12/20/2016  Note by: Gaspar Cola, MD  Service setting: Ambulatory outpatient  Specialty: Interventional Pain Management  Location: [facility] (AMB) Pain Management Facility    Patient type: Established   Primary Reason(s) for Visit: Encounter for prescription drug management. (Level of risk: moderate)  CC: Back Pain (lower) and Shoulder Pain (bilaterally)  HPI  Howard Murphy is a 61 y.o. year old, male patient, who comes today for a medication management evaluation. He has Benign fibroma of prostate; Elevated hemoglobin (HCC); GERD (gastroesophageal reflux disease); Hypertension; Hyperlipidemia; History of lumbar facet Synovial cyst, surgically removed.; RA (rheumatoid arthritis) ([place]); SOB (shortness of breath); Chronic abdominal pain (RUQ); Type 2 diabetes mellitus with peripheral neuropathy ([place]); Chest pain; Hypertriglyceridemia; Opiate use (60 MME/Day); Long term prescription opiate use; Long term current use of opiate analgesic; Chronic low back pain (Bilateral) (L>R); Spondylosis of lumbar spine; Lumbar facet syndrome (Bilateral) (L>R); Chronic hip pain (Left); Diabetic peripheral neuropathy ([place]); Chronic lumbar radicular pain (S1 Dermatome) (Bilateral) (L>R); Hypokalemia; Complete tear of the shoulder rotator cuff (Left); Chronic shoulder pain (Location of Primary Source of Pain) (Bilateral) (L>R); Diabetes ([place]); Chronic foot pain (Location of Secondary source of pain) (Bilateral) (L>R); Chronic hand pain (Location of Tertiary source of pain) (Bilateral) (R>L); Lumbar facet hypertrophy; Encounter for therapeutic drug level monitoring; Encounter for pain management planning; Low testosterone; Occipital pain (Right); Failed back surgical syndrome 4; Chronic low back pain (Location of Primary Source of Pain) (Bilateral) (L>R); Radicular pain of  shoulder (Right); Carpal tunnel syndrome (Bilateral); Chronic cervical radicular pain (Right); Abnormal MRI, lumbar spine; Chronic sacroiliac joint pain (Bilateral) (L>R); Neurogenic pain; Cervical spondylosis; Cervical facet arthropathy; Chronic neck pain (Bilateral) (R>L); Cervical facet syndrome (Bilateral) (R>L); Chronic upper extremity pain (Bilateral) (R>L); Cervical foraminal stenosis;  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 His primarily concern today is the Back Pain (lower) and Shoulder Pain (bilaterally)  Pain Assessment: Location: Lower Back Radiating: radiates up to mid back- not radiating down leg since procedure and able to walk Onset: More than a month ago Duration: Chronic pain Quality: Discomfort, Aching, Stabbing Severity: 5 /10 (self-reported pain score)  Note: Reported level is compatible with observation.                   Effect on ADL: Pace self- rest not able to do things he once did Timing:   Modifying factors: nothing  Howard Murphy was last scheduled for an appointment on 11/29/2016 for medication management. Howard Murphy unfortunately has a bad habit of not keeping his postprocedure evaluation appointments. On 01/05/2016 he had a diagnostic bilateral lumbar facet block and diagnostic bilateral sacroiliac joint block under fluoroscopic guidance and IV sedation. On the day of the procedure he came in indicating that his pain score was a 5/10 and after having completed the procedure he indicated that his pain was a  0/10. This would suggest that the patient attained 100% relief of the pain with a diagnostic procedure. He was scheduled to return to the  clinics 2 weeks later for further interpretation but he did not keep that appointment. In fact, he went on to have further surgery of his back. The patient had a prior history of 2 back surgeries and went on to have a third one by Dr. April Holding. The patient describes that during the surgery there was a complication where he developed a CSF leak that had to be packaged during the surgery. After the surgery he continued to experience left lower extremity pain going all the way down to the bottom of his foot in what seems to be an S1 dermatomal distribution. Because of the failure of the surgery to provide him with relief of the pain, he had to undergo a second surgery, increasing his back surgery count to 4. On 06/25/2016 the patient returned and had a second diagnostic left-sided lumbar facet block done under fluoroscopic guidance and IV sedation. On the day in question, he had indicated before the procedure that his pain was a 10/10. However, prior to discharge the patient reported a pain level of 0/10, suggesting again a 100% relief of his pain. Unfortunately, once again he did not keep his postprocedure evaluation appointment, returning again on 11/29/2016 for follow-up. It is our experience that patients have extreme leap or recollection of events passed a period of 2 weeks post procedure. In addition, the patient did not bring with him his assigned postprocedure pain diary. Today, when questioned about the results of those procedures, he indicated that they did not provide him any relief of the pain. It is my opinion, he's making the grave mistake of making assumptions, without having the proper training to evaluate the results of the diagnostic blocks. During today's appointment we reviewed Howard Murphy's chronic pain status, as well as his outpatient medication  regimen.   According to the patient his primary pain is his lower back. He states that this in the middle of his back and does not radiate. He is status post back surgery in January 2018 by Dr. Roseanne Reno. He does admits the surgery was more for leg pain. He states that the surgery was not effective. He denies physical therapy post surgery. He had images in [place] few months ago. She states that he is back now for pain management. His previous patient of Dr. Lowella Dandy status post lumbar facet block which he states was ineffective.  His second area of pain is in his shoulders. He admits that the right is greater than the left. He does have numbness tingling and weakness in the middle finger on the right. He is status post right rotator cuff repair with revision. He admits that he also had left rotator cuff repair. He denies any physical therapy post surgery. He denies any recent images.  He admits that he has pain in his hands. He states he was diagnosed with arthritis. He states right being greater than the left. He denies any interventional therapy, physical therapy or recent images.  He admits that he does have burning in both feet he feels like this secondary to his diabetes. He denies previous EMG  The patient  reports that he does not use drugs. His body mass index is 28.98 kg/m.  Further details on both, my assessment(s), as well as the proposed treatment plan, please see below.  Controlled Substance Pharmacotherapy Assessment REMS (Risk Evaluation and Mitigation Strategy)  Analgesic: Oxycodone IR 10 mg 1 tablet by mouth 4 times a day (40 mg/day of oxycodone) (60 MME/Day) MME/day: 60 mg/day.  Fabio Neighbors,  Shirlyn Goltz, RN  12/20/2016  8:37 AM  Sign at close encounter Safety precautions to be maintained throughout the outpatient stay will include: orient to surroundings, keep bed in low position, maintain call bell within reach at all times, provide assistance with transfer out of bed and  ambulation.    Pharmacokinetics: Liberation and absorption (onset of action): WNL Distribution (time to peak effect): WNL Metabolism and excretion (duration of action): WNL         Pharmacodynamics: Desired effects: Analgesia: Mr. Siverson reports >50% benefit. Functional ability: Patient reports that medication allows him to accomplish basic ADLs Clinically meaningful improvement in function (CMIF): Sustained CMIF goals met Perceived effectiveness: Described as relatively effective, allowing for increase in activities of daily living (ADL) Undesirable effects: Side-effects or Adverse reactions: None reported Monitoring: [place] PMP: Online review of the past 80-monthperiod conducted. Compliant with practice rules and regulations List of all UDS test(s) done:  Lab Results  Component Value Date   TOXASSSELUR FINAL 11/21/2015   [place] 10/03/2015   SUMMARY FINAL 11/29/2016   SUMMARY FINAL 09/05/2015   Last UDS on record: ToxAssure Select 13  Date Value Ref Range Status  11/21/2015 FINAL  Final    Comment:    ==================================================================== TOXASSURE SELECT 13 (MW) ==================================================================== Test                             Result       Flag       Units Drug Present and Declared for Prescription Verification   Oxycodone                      912          EXPECTED   ng/mg creat   Oxymorphone                    863          EXPECTED   ng/mg creat   Noroxycodone                   1428         EXPECTED   ng/mg creat   Noroxymorphone                 302          EXPECTED   ng/mg creat    Sources of oxycodone are scheduled prescription medications.    Oxymorphone, noroxycodone, and noroxymorphone are expected    metabolites of oxycodone. Oxymorphone is also available as a    scheduled prescription medication. ==================================================================== Test                       Result    Flag   Units      Ref Range   Creatinine              218              mg/dL      >=20 ==================================================================== Declared Medications:  The flagging and interpretation on this report are based on the  following declared medications.  Unexpected results may arise from  inaccuracies in the declared medications.  **Note: The testing scope of this panel includes these medications:  Oxycodone  **Note: The testing scope of this panel does not include following  reported medications:  Amlodipine (Norvasc)  Chlorthalidone  Cyanocobalamin  Icosapent  Lisinopril  Naloxone  Omeprazole (Prilosec)  Ondansetron (Zofran)  Rosuvastatin (Crestor)  Sitagliptin (Januvia)  Sucralfate (Carafate)  Testosterone ==================================================================== For clinical consultation, please call [phone]. ====================================================================    Summary  Date Value Ref Range Status  11/29/2016 FINAL  Final    Comment:    ==================================================================== TOXASSURE SELECT 13 (MW) ==================================================================== Test                             Result       Flag       Units Drug Present   Oxycodone                      1718                    ng/mg creat   Oxymorphone                    1595                    ng/mg creat   Noroxycodone                   2663                    ng/mg creat   Noroxymorphone                 589                     ng/mg creat    Sources of oxycodone are scheduled prescription medications.    Oxymorphone, noroxycodone, and noroxymorphone are expected    metabolites of oxycodone. Oxymorphone is also available as a    scheduled prescription medication. ==================================================================== Test                      Result    Flag   Units      Ref Range    Creatinine              188              mg/dL      >=20 ==================================================================== Declared Medications:  Medication list was not provided. ==================================================================== For clinical consultation, please call [phone]. ====================================================================    UDS interpretation: Compliant          Medication Assessment Form: Reviewed. Patient indicates being compliant with therapy Treatment compliance: Compliant Risk Assessment Profile: Aberrant behavior: See prior evaluations. None observed or detected today Comorbid factors increasing risk of overdose: See prior notes. Murphy additional risks detected today Risk of substance use disorder (SUD): Low Opioid Risk Tool (ORT) Total Score: 0  Interpretation Table:  Score <3 = Low Risk for SUD  Score between 4-7 = Moderate Risk for SUD  Score >8 = High Risk for Opioid Abuse   Risk Mitigation Strategies:  Patient Counseling: Covered Patient-Prescriber Agreement (PPA): Present and active  Notification to other healthcare providers: Done  Pharmacologic Plan: Murphy change in therapy, at this time  Laboratory Chemistry  Inflammation Markers (CRP: Acute Phase) (ESR: Chronic Phase) Lab Results  Component Value Date   CRP 1.8 11/29/2016   ESRSEDRATE 11 11/29/2016                 Renal Function Markers Lab Results  Component Value Date   BUN 13 12/01/2016   CREATININE 0.92 12/01/2016  GFRAA >60 12/01/2016   GFRNONAA >60 12/01/2016                 Hepatic Function Markers Lab Results  Component Value Date   AST 34 12/01/2016   ALT 27 12/01/2016   ALBUMIN 4.2 12/01/2016   ALKPHOS 56 12/01/2016                 Electrolytes Lab Results  Component Value Date   NA 133 (L) 12/01/2016   K 3.3 (L) 12/01/2016   CL 100 (L) 12/01/2016   CALCIUM 9.2 12/01/2016   MG 2.0 11/29/2016                 Neuropathy  Markers Lab Results  Component Value Date   ZOXWRUEA54 098 11/29/2016                 Bone Pathology Markers Lab Results  Component Value Date   ALKPHOS 56 12/01/2016   25OHVITD1 24 (L) 11/29/2016   25OHVITD2 <1.0 11/29/2016   25OHVITD3 24 11/29/2016   CALCIUM 9.2 12/01/2016                 Coagulation Parameters Lab Results  Component Value Date   PLT 239 12/01/2016                 Cardiovascular Markers Lab Results  Component Value Date   HGB 18.1 (H) 12/01/2016   HCT 53.8 (H) 12/01/2016                 Note: Lab results reviewed.  Recent Diagnostic Imaging Review  Ct Renal Stone Study Result Date: 12/01/2016 CLINICAL DATA:  Left-sided flank pain beginning a few days ago. EXAM: CT ABDOMEN AND PELVIS WITHOUT CONTRAST TECHNIQUE: Multidetector CT imaging of the abdomen and pelvis was performed following the standard protocol without IV contrast. COMPARISON:  01/18/2016 FINDINGS: Lower chest: Moderate stable elevated right hemidiaphragm. Minimal right basilar atelectasis. Calcified plaque over the left anterior descending coronary artery and left main coronary artery. Hepatobiliary: Previous cholecystectomy. Liver and biliary tree are within normal. Pancreas: Unremarkable. Murphy pancreatic ductal dilatation or surrounding inflammatory changes. Spleen: Normal in size without focal abnormality. Adrenals/Urinary Tract: Adrenal glands are normal. Kidneys are normal in size. 5 mm stone over the mid to upper pole of the right intrarenal collecting system. Murphy significant hydronephrosis. Murphy left renal stones. Ureters are within normal without evidence of stones. Bladder is normal Stomach/Bowel: Stomach and small bowel are within normal. Appendix is normal. Colon is unremarkable. Vascular/Lymphatic: Minimal calcified plaque over the abdominal aorta. Murphy adenopathy. Reproductive: Within normal. Other: Murphy free fluid or focal inflammatory change. Small umbilical hernia containing only mesenteric fat.  Musculoskeletal: Mild degenerate change of the spine and hips. Posterior fusion hardware from L3-L5. IMPRESSION: Murphy acute findings in the abdomen/pelvis. 5 mm nonobstructing right renal stone. Minimal atherosclerotic coronary artery disease. Aortic atherosclerosis. Small umbilical hernia containing only mesenteric fat. Electronically Signed   By: Marin Olp M.D.   On: 12/01/2016 09:04   Note: Imaging results reviewed.          Meds   Current Meds  Medication Sig  . amLODipine (NORVASC) 5 MG tablet Take 1 tablet (5 mg total) by mouth daily.  . celecoxib (CELEBREX) 200 MG capsule Take 1 capsule (200 mg total) by mouth every 12 (twelve) hours.  . chlorthalidone (HYGROTON) 50 MG tablet Take 50 mg by mouth daily.   Marland Kitchen lisinopril (PRINIVIL,ZESTRIL) 20 MG tablet Take 1 tablet (20  mg total) by mouth daily. Reported on 12/08/2015  . omeprazole (PRILOSEC) 40 MG capsule Take 40 mg by mouth 2 (two) times daily.  . Oxycodone HCl 10 MG TABS Take 1 tablet (10 mg total) by mouth every 6 (six) hours as needed.  . promethazine (PHENERGAN) 25 MG tablet Take 1 tablet (25 mg total) by mouth every 6 (six) hours as needed for nausea or vomiting.  . rosuvastatin (CRESTOR) 40 MG tablet Take 1 tablet (40 mg total) by mouth at bedtime.  . sitaGLIPtin (JANUVIA) 50 MG tablet Take 1 tablet (50 mg total) by mouth daily.  Marland Kitchen testosterone cypionate (DEPOTESTOSTERONE CYPIONATE) 200 MG/ML injection Inject 200 mg into the muscle every 14 (fourteen) days.    ROS  Constitutional: Denies any fever or chills Gastrointestinal: Murphy reported hemesis, hematochezia, vomiting, or acute GI distress Musculoskeletal: Denies any acute onset joint swelling, redness, loss of ROM, or weakness Neurological: Murphy reported episodes of acute onset apraxia, aphasia, dysarthria, agnosia, amnesia, paralysis, loss of coordination, or loss of consciousness  Allergies  Howard Murphy has Murphy Known Allergies.  [place]  Drug: Howard Murphy  reports that he does  not use drugs. Alcohol:  reports that he does not drink alcohol. Tobacco:  reports that he has never smoked. He quit smokeless tobacco use about 3 years ago. His smokeless tobacco use included Chew. Medical:  has a past medical history of Arthritis; Back ache (05/05/2013); Diabetes mellitus without complication ([place]); GERD (gastroesophageal reflux disease); Headache; Hyperlipemia; Hypertension; Kidney stones; Nephrolithiasis; and Shortness of breath dyspnea. Surgical: Mr. Botting  has a past surgical history that includes Cholecystectomy (2000); Shoulder surgery (Right, 2016); Colonoscopy with propofol (N/A, 04/11/2015); Esophagogastroduodenoscopy (egd) with propofol (N/A, 04/11/2015); Shoulder surgery (Left, 06/2015); and Back surgery (2015). Family: family history includes Cancer in his maternal grandmother, mother, sister, and sister; Heart disease in his father; Stroke in his father.  Constitutional Exam  General appearance: Well nourished, well developed, and well hydrated. In Murphy apparent acute distress Vitals:   12/20/16 0832  BP: (!) 155/97  Pulse: 64  Resp: 16  Temp: 98 F (36.7 C)  SpO2: 97%  Weight: 185 lb (83.9 kg)  Height: '5\' 7"'  (1.702 m)   BMI Assessment: Estimated body mass index is 28.98 kg/m as calculated from the following:   Height as of this encounter: '5\' 7"'  (1.702 m).   Weight as of this encounter: 185 lb (83.9 kg).  BMI interpretation table: BMI level Category Range association with higher incidence of chronic pain  <18 kg/m2 Underweight   18.5-24.9 kg/m2 Ideal body weight   25-29.9 kg/m2 Overweight Increased incidence by 20%  30-34.9 kg/m2 Obese (Class I) Increased incidence by 68%  35-39.9 kg/m2 Severe obesity (Class II) Increased incidence by 136%  >40 kg/m2 Extreme obesity (Class III) Increased incidence by 254%   BMI Readings from Last 4 Encounters:  12/20/16 28.98 kg/m  12/01/16 29.76 kg/m  11/29/16 30.85 kg/m  08/08/16 30.07 kg/m   Wt Readings  from Last 4 Encounters:  12/20/16 185 lb (83.9 kg)  12/01/16 190 lb (86.2 kg)  11/29/16 197 lb (89.4 kg)  08/08/16 192 lb (87.1 kg)  Psych/Mental status: Alert, oriented x 3 (person, place, & time)       Eyes: PERLA Respiratory: Murphy evidence of acute respiratory distress  Cervical Spine Exam  Inspection: Murphy masses, redness, or swelling Alignment: Symmetrical Functional ROM: Unrestricted ROM      Stability: Murphy instability detected Muscle strength & Tone: Functionally intact Sensory: Unimpaired Palpation: Murphy  palpable anomalies              Upper Extremity (UE) Exam    Side: Right upper extremity  Side: Left upper extremity  Inspection: Murphy masses, redness, swelling, or asymmetry. Murphy contractures  Inspection: Murphy masses, redness, swelling, or asymmetry. Murphy contractures  Functional ROM: Unrestricted ROM          Functional ROM: Unrestricted ROM          Muscle strength & Tone: Functionally intact  Muscle strength & Tone: Functionally intact  Sensory: Unimpaired  Sensory: Unimpaired  Palpation: Murphy palpable anomalies              Palpation: Murphy palpable anomalies              Specialized Test(s): Deferred         Specialized Test(s): Deferred          Thoracic Spine Exam  Inspection: Murphy masses, redness, or swelling Alignment: Symmetrical Functional ROM: Unrestricted ROM Stability: Murphy instability detected Sensory: Unimpaired Muscle strength & Tone: Murphy palpable anomalies  Lumbar Spine Exam  Inspection: Well healed scar from previous spine surgery detected Alignment: Symmetrical Functional ROM: Minimal ROM      Stability: Murphy instability detected Muscle strength & Tone: Functionally intact Sensory: Movement-associated pain Palpation: Complains of area being tender to palpation       Provocative Tests: Lumbar Hyperextension and rotation test: Positive bilaterally for facet joint pain. Patrick's Maneuver: Positive for bilateral S-I arthralgia              Gait & Posture Assessment   Ambulation: Limited Gait: Antalgic Posture: Antalgic   Lower Extremity Exam    Side: Right lower extremity  Side: Left lower extremity  Inspection: Murphy masses, redness, swelling, or asymmetry. Murphy contractures  Inspection: Murphy masses, redness, swelling, or asymmetry. Murphy contractures  Functional ROM: Unrestricted ROM          Functional ROM: Unrestricted ROM          Muscle strength & Tone: Functionally intact  Muscle strength & Tone: Functionally intact  Sensory: Unimpaired  Sensory: Unimpaired  Palpation: Murphy palpable anomalies  Palpation: Murphy palpable anomalies   Assessment  Primary Diagnosis & Pertinent Problem List: The primary encounter diagnosis was Bertolotti's syndrome (L5-S1) (Bilateral). Diagnoses of Chronic sacroiliac joint pain (Bilateral) (L>R), Chronic low back pain (Location of Primary Source of Pain) (Bilateral) (L>R), Lumbar spondylosis, Failed back surgical syndrome 2, Neurogenic pain, Chronic pain syndrome, and Opiate use (60 MME/Day) were also pertinent to this visit.  Status Diagnosis  Worsening Having a Flare-up Persistent 1. Bertolotti's syndrome (L5-S1) (Bilateral)   2. Chronic sacroiliac joint pain (Bilateral) (L>R)   3. Chronic low back pain (Location of Primary Source of Pain) (Bilateral) (L>R)   4. Lumbar spondylosis   5. Failed back surgical syndrome 2   6. Neurogenic pain   7. Chronic pain syndrome   8. Opiate use (60 MME/Day)     Problems updated and reviewed during this visit: Murphy problems updated. Plan of Care  Pharmacotherapy (Medications Ordered): Meds ordered this encounter  Medications  . Oxycodone HCl 10 MG TABS    Sig: Take 1 tablet (10 mg total) by mouth every 6 (six) hours as needed.    Dispense:  120 tablet    Refill:  0    Do not place this medication, or any other prescription from our practice, on "Automatic Refill". Patient may have prescription filled one day early if pharmacy is closed  on scheduled refill date. Do not fill until:  12/20/16 To last until: 01/19/17   New Prescriptions   OXYCODONE HCL 10 MG TABS    Take 1 tablet (10 mg total) by mouth every 6 (six) hours as needed.   Medications administered today: Howard Murphy had Murphy medications administered during this visit.  Lab-work, procedure(s), and/or referral(s): Orders Placed This Encounter  Procedures  . SACROILIAC JOINT INJECTINS    Interventional management options: Planned, scheduled, and/or pending:   Diagnostic bilateral sacroiliac joint block into the area of the L5-S1 pseudoarthrosis.    Considering:   Diagnostic bilateral cervical facet block  Possible bilateral cervical facet radiofrequency ablation  Diagnostic right-sided greater occipital nerve block  Possible right-sided greater occipital nerve radiofrequency ablation  Possible right-sided peripheral nerve stimulator trial  Diagnostic right-sided cervical epidural steroid injection  Diagnostic bilateral intra-articular shoulder injection  Diagnostic bilateral suprascapular nerve block  Possible bilateral suprascapular nerve radiofrequency ablation  Diagnostic bilateral sacroiliac joint block  Possible bilateral sacroiliac joint radiofrequency ablation  Diagnostic bilateral lumbar facet block  Possible bilateral lumbar facet radiofrequency ablation  Diagnostic left L4-5 transforaminal epidural steroid injection  Diagnostic left-sided L4-5 lumbar epidural steroid injection  Diagnostic left intra-articular hip joint injection  Possible left hip joint radiofrequency ablation  Diagnostic bilateral median nerve block (carpal tunnel block)  Diagnostic right-sided celiac plexus block    Palliative PRN treatment(s):   None at this time.   Provider-requested follow-up: Return for procedure (w/ sedation), (ASAP), by MD, in addition, Med-Mgmt, (1 mo), w/ MD.  Future Appointments Date Time Provider [place]  12/24/2016 9:45 AM Milinda Pointer, MD [facility] None  01/14/2017 1:00 PM  Milinda Pointer, MD [facility] None   Primary Care Physician: [facility] [place] Location: [facility] Facility Note by: Gaspar Cola, MD Date: 12/20/2016; Time: 10:11 AM  Patient Instructions   ____________________________________________________________________________________________  Preparing for Procedure with Sedation Instructions: . Oral Intake: Do not eat or drink anything for at least 8 hours prior to your procedure. . Transportation: Public transportation is not allowed. Bring an adult driver. The driver must be physically present in our waiting room before any procedure can be started. Marland Kitchen Physical Assistance: Bring an adult physically capable of assisting you, in the event you need help. This adult should keep you company at home for at least 6 hours after the procedure. . Blood Pressure Medicine: Take your blood pressure medicine with a sip of water the morning of the procedure. . Blood thinners:  . Diabetics on insulin: Notify the staff so that you can be scheduled 1st case in the morning. If your diabetes requires high dose insulin, take only  of your normal insulin dose the morning of the procedure and notify the staff that you have done so. . Preventing infections: Shower with an antibacterial soap the morning of your procedure. . Build-up your immune system: Take 1000 mg of Vitamin C with every meal (3 times a day) the day prior to your procedure. Marland Kitchen Antibiotics: Inform the staff if you have a condition or reason that requires you to take antibiotics before dental procedures. . Pregnancy: If you are pregnant, call and cancel the procedure. . Sickness: If you have a cold, fever, or any active infections, call and cancel the procedure. . Arrival: You must be in the facility at least 30 minutes prior to your scheduled procedure. . Children: Do not bring children with you. . Dress appropriately: Bring dark clothing that you would not mind  if they get stained. . Valuables: Do not bring any jewelry or valuables. Procedure appointments are reserved for interventional treatments only. Marland Kitchen Murphy Prescription Refills. . Murphy medication changes will be discussed during procedure appointments. . Murphy disability issues will be discussed. ____________________________________________________________________________________________  Sacroiliac (SI) Joint Injection Patient Information  Description: The sacroiliac joint connects the scrum (very low back and tailbone) to the ilium (a pelvic bone which also forms half of the hip joint).  Normally this joint experiences very little motion.  When this joint becomes inflamed or unstable low back and or hip and pelvis pain may result.  Injection of this joint with local anesthetics (numbing medicines) and steroids can provide diagnostic information and reduce pain.  This injection is performed with the aid of x-ray guidance into the tailbone area while you are lying on your stomach.   You may experience an electrical sensation down the leg while this is being done.  You may also experience numbness.  We also may ask if we are reproducing your normal pain during the injection.  Conditions which may be treated SI injection:   Low back, buttock, hip or leg pain  Preparation for the Injection:  1. Do not eat any solid food or dairy products within 8 hours of your appointment.  2. You may drink clear liquids up to 3 hours before appointment.  Clear liquids include water, black coffee, juice or soda.  Murphy milk or cream please. 3. You may take your regular medications, including pain medications with a sip of water before your appointment.  Diabetics should hold regular insulin (if take separately) and take 1/2 normal NPH dose the morning of the procedure.  Carry some sugar containing items with you to your appointment. 4. A driver must accompany you and be prepared to drive you home after your  procedure. 5. Bring all of your current medications with you. 6. An IV may be inserted and sedation may be given at the discretion of the physician. 7. A blood pressure cuff, EKG and other monitors will often be applied during the procedure.  Some patients may need to have extra oxygen administered for a short period.  8. You will be asked to provide medical information, including your allergies, prior to the procedure.  We must know immediately if you are taking blood thinners (like Coumadin/Warfarin) or if you are allergic to IV iodine contrast (dye).  We must know if you could possible be pregnant.  Possible side effects:   Bleeding from needle site  Infection (rare, may require surgery)  Nerve injury (rare)  Numbness & tingling (temporary)  A brief convulsion or seizure  Light-headedness (temporary)  Pain at injection site (several days)  Decreased blood pressure (temporary)  Weakness in the leg (temporary)   Call if you experience:   New onset weakness or numbness of an extremity below the injection site that last more than 8 hours.  Hives or difficulty breathing ( go to the emergency room)  Inflammation or drainage at the injection site  Any new symptoms which are concerning to you  Please note:  Although the local anesthetic injected can often make your back/ hip/ buttock/ leg feel good for several hours after the injections, the pain will likely return.  It takes 3-7 days for steroids to work in the sacroiliac area.  You may not notice any pain relief for at least that one week.  If effective, we will often do a series of three injections spaced 3-6 weeks apart  to maximally decrease your pain.  After the initial series, we generally will wait some months before a repeat injection of the same type.  If you have any questions, please call [phone] [place]  What are the risk, side  effects and possible complications? Generally speaking, most procedures are safe.  However, with any procedure there are risks, side effects, and the possibility of complications.  The risks and complications are dependent upon the sites that are lesioned, or the type of nerve block to be performed.  The closer the procedure is to the spine, the more serious the risks are.  Great care is taken when placing the radio frequency needles, block needles or lesioning probes, but sometimes complications can occur. 1. Infection: Any time there is an injection through the skin, there is a risk of infection.  This is why sterile conditions are used for these blocks.  There are four possible types of infection. 1. Localized skin infection. 2. Central Nervous System Infection-This can be in the form of Meningitis, which can be deadly. 3. Epidural Infections-This can be in the form of an epidural abscess, which can cause pressure inside of the spine, causing compression of the spinal cord with subsequent paralysis. This would require an emergency surgery to decompress, and there are Murphy guarantees that the patient would recover from the paralysis. 4. Discitis-This is an infection of the intervertebral discs.  It occurs in about 1% of discography procedures.  It is difficult to treat and it may lead to surgery.        2. Pain: the needles have to go through skin and soft tissues, will cause soreness.       3. Damage to internal structures:  The nerves to be lesioned may be near blood vessels or    other nerves which can be potentially damaged.       4. Bleeding: Bleeding is more common if the patient is taking blood thinners such as  aspirin, Coumadin, Ticiid, Plavix, etc., or if he/she have some genetic predisposition  such as hemophilia. Bleeding into the spinal canal can cause compression of the spinal  cord with subsequent paralysis.  This would require an emergency surgery to  decompress and there are Murphy guarantees  that the patient would recover from the  paralysis.       5. Pneumothorax:  Puncturing of a lung is a possibility, every time a needle is introduced in  the area of the chest or upper back.  Pneumothorax refers to free air around the  collapsed lung(s), inside of the thoracic cavity (chest cavity).  Another two possible  complications related to a similar event would include: Hemothorax and Chylothorax.   These are variations of the Pneumothorax, where instead of air around the collapsed  lung(s), you may have blood or chyle, respectively.       6. Spinal headaches: They may occur with any procedures in the area of the spine.       7. Persistent CSF (Cerebro-Spinal Fluid) leakage: This is a rare problem, but may occur  with prolonged intrathecal or epidural catheters either due to the formation of a fistulous  track or a dural tear.       8. Nerve damage: By working so close to the spinal cord, there is always a possibility of  nerve damage, which could be as serious as a permanent spinal cord injury with  paralysis.  9. Death:  Although rare, severe deadly allergic reactions known as "Anaphylactic  reaction" can occur to any of the medications used.      10. Worsening of the symptoms:  We can always make thing worse.  What are the chances of something like this happening? Chances of any of this occuring are extremely low.  By statistics, you have more of a chance of getting killed in a motor vehicle accident: while driving to the hospital than any of the above occurring .  Nevertheless, you should be aware that they are possibilities.  In general, it is similar to taking a shower.  Everybody knows that you can slip, hit your head and get killed.  Does that mean that you should not shower again?  Nevertheless always keep in mind that statistics do not mean anything if you happen to be on the wrong side of them.  Even if a procedure has a 1 (one) in a 1,000,000 (million) chance of going wrong, it you  happen to be that one..Also, keep in mind that by statistics, you have more of a chance of having something go wrong when taking medications.  Who should not have this procedure? If you are on a blood thinning medication (e.g. Coumadin, Plavix, see list of "Blood Thinners"), or if you have an active infection going on, you should not have the procedure.  If you are taking any blood thinners, please inform your physician.  How should I prepare for this procedure?  Do not eat or drink anything at least six hours prior to the procedure.  Bring a driver with you .  It cannot be a taxi.  Come accompanied by an adult that can drive you back, and that is strong enough to help you if your legs get weak or numb from the local anesthetic.  Take all of your medicines the morning of the procedure with just enough water to swallow them.  If you have diabetes, make sure that you are scheduled to have your procedure done first thing in the morning, whenever possible.  If you have diabetes, take only half of your insulin dose and notify our nurse that you have done so as soon as you arrive at the clinic.  If you are diabetic, but only take blood sugar pills (oral hypoglycemic), then do not take them on the morning of your procedure.  You may take them after you have had the procedure.  Do not take aspirin or any aspirin-containing medications, at least eleven (11) days prior to the procedure.  They may prolong bleeding.  Wear loose fitting clothing that may be easy to take off and that you would not mind if it got stained with Betadine or blood.  Do not wear any jewelry or perfume  Remove any nail coloring.  It will interfere with some of our monitoring equipment.  NOTE: Remember that this is not meant to be interpreted as a complete list of all possible complications.  Unforeseen problems may occur.  BLOOD THINNERS The following drugs contain aspirin or other products, which can cause increased  bleeding during surgery and should not be taken for 2 weeks prior to and 1 week after surgery.  If you should need take something for relief of minor pain, you may take acetaminophen which is found in Tylenol,m Datril, Anacin-3 and Panadol. It is not blood thinner. The products listed below are.  Do not take any of the products listed below in addition to any listed on  your instruction sheet.  A.P.C or A.P.C with Codeine Codeine Phosphate Capsules #3 Ibuprofen Ridaura  ABC compound Congesprin Imuran rimadil  Advil Cope Indocin Robaxisal  Alka-Seltzer Effervescent Pain Reliever and Antacid Coricidin or Coricidin-D  Indomethacin Rufen  Alka-Seltzer plus Cold Medicine Cosprin Ketoprofen S-A-C Tablets  Anacin Analgesic Tablets or Capsules Coumadin Korlgesic Salflex  Anacin Extra Strength Analgesic tablets or capsules CP-2 Tablets Lanoril Salicylate  Anaprox Cuprimine Capsules Levenox Salocol  Anexsia-D Dalteparin Magan Salsalate  Anodynos Darvon compound Magnesium Salicylate Sine-off  Ansaid Dasin Capsules Magsal Sodium Salicylate  Anturane Depen Capsules Marnal Soma  APF Arthritis pain formula Dewitt's Pills Measurin Stanback  Argesic Dia-Gesic Meclofenamic Sulfinpyrazone  Arthritis Bayer Timed Release Aspirin Diclofenac Meclomen Sulindac  Arthritis pain formula Anacin Dicumarol Medipren Supac  Analgesic (Safety coated) Arthralgen Diffunasal Mefanamic Suprofen  Arthritis Strength Bufferin Dihydrocodeine Mepro Compound Suprol  Arthropan liquid Dopirydamole Methcarbomol with Aspirin Synalgos  ASA tablets/Enseals Disalcid Micrainin Tagament  Ascriptin Doan's Midol Talwin  Ascriptin A/D Dolene Mobidin Tanderil  Ascriptin Extra Strength Dolobid Moblgesic Ticlid  Ascriptin with Codeine Doloprin or Doloprin with Codeine Momentum Tolectin  Asperbuf Duoprin Mono-gesic Trendar  Aspergum Duradyne Motrin or Motrin IB Triminicin  Aspirin plain, buffered or enteric coated Durasal Myochrisine Trigesic   Aspirin Suppositories Easprin Nalfon Trillsate  Aspirin with Codeine Ecotrin Regular or Extra Strength Naprosyn Uracel  Atromid-S Efficin Naproxen Ursinus  Auranofin Capsules Elmiron Neocylate Vanquish  Axotal Emagrin Norgesic Verin  Azathioprine Empirin or Empirin with Codeine Normiflo Vitamin E  Azolid Emprazil Nuprin Voltaren  Bayer Aspirin plain, buffered or children's or timed BC Tablets or powders Encaprin Orgaran Warfarin Sodium  Buff-a-Comp Enoxaparin Orudis Zorpin  Buff-a-Comp with Codeine Equegesic Os-Cal-Gesic   Buffaprin Excedrin plain, buffered or Extra Strength Oxalid   Bufferin Arthritis Strength Feldene Oxphenbutazone   Bufferin plain or Extra Strength Feldene Capsules Oxycodone with Aspirin   Bufferin with Codeine Fenoprofen Fenoprofen Pabalate or Pabalate-SF   Buffets II Flogesic Panagesic   Buffinol plain or Extra Strength Florinal or Florinal with Codeine Panwarfarin   Buf-Tabs Flurbiprofen Penicillamine   Butalbital Compound Four-way cold tablets Penicillin   Butazolidin Fragmin Pepto-Bismol   Carbenicillin Geminisyn Percodan   Carna Arthritis Reliever Geopen Persantine   Carprofen Gold's salt Persistin   Chloramphenicol Goody's Phenylbutazone   Chloromycetin Haltrain Piroxlcam   Clmetidine heparin Plaquenil   Cllnoril Hyco-pap Ponstel   Clofibrate Hydroxy chloroquine Propoxyphen         Before stopping any of these medications, be sure to consult the physician who ordered them.  Some, such as Coumadin (Warfarin) are ordered to prevent or treat serious conditions such as "deep thrombosis", "pumonary embolisms", and other heart problems.  The amount of time that you may need off of the medication may also vary with the medication and the reason for which you were taking it.  If you are taking any of these medications, please make sure you notify your pain physician before you undergo any procedures.

## 2016-12-20 ENCOUNTER — Ambulatory Visit: Payer: Medicare Other | Attending: Pain Medicine | Admitting: Pain Medicine

## 2016-12-20 VITALS — BP 155/97 | HR 64 | Temp 98.0°F | Resp 16 | Ht 67.0 in | Wt 185.0 lb

## 2016-12-20 DIAGNOSIS — F1190 Opioid use, unspecified, uncomplicated: Secondary | ICD-10-CM

## 2016-12-20 DIAGNOSIS — Z87891 Personal history of nicotine dependence: Secondary | ICD-10-CM | POA: Diagnosis not present

## 2016-12-20 DIAGNOSIS — N400 Enlarged prostate without lower urinary tract symptoms: Secondary | ICD-10-CM | POA: Insufficient documentation

## 2016-12-20 DIAGNOSIS — G5603 Carpal tunnel syndrome, bilateral upper limbs: Secondary | ICD-10-CM | POA: Insufficient documentation

## 2016-12-20 DIAGNOSIS — I10 Essential (primary) hypertension: Secondary | ICD-10-CM | POA: Insufficient documentation

## 2016-12-20 DIAGNOSIS — M47816 Spondylosis without myelopathy or radiculopathy, lumbar region: Secondary | ICD-10-CM | POA: Diagnosis not present

## 2016-12-20 DIAGNOSIS — M25512 Pain in left shoulder: Secondary | ICD-10-CM | POA: Diagnosis not present

## 2016-12-20 DIAGNOSIS — Z79891 Long term (current) use of opiate analgesic: Secondary | ICD-10-CM | POA: Diagnosis not present

## 2016-12-20 DIAGNOSIS — K219 Gastro-esophageal reflux disease without esophagitis: Secondary | ICD-10-CM | POA: Insufficient documentation

## 2016-12-20 DIAGNOSIS — M545 Low back pain: Secondary | ICD-10-CM

## 2016-12-20 DIAGNOSIS — G894 Chronic pain syndrome: Secondary | ICD-10-CM | POA: Insufficient documentation

## 2016-12-20 DIAGNOSIS — R0602 Shortness of breath: Secondary | ICD-10-CM | POA: Diagnosis not present

## 2016-12-20 DIAGNOSIS — M069 Rheumatoid arthritis, unspecified: Secondary | ICD-10-CM | POA: Diagnosis not present

## 2016-12-20 DIAGNOSIS — G8929 Other chronic pain: Secondary | ICD-10-CM | POA: Diagnosis not present

## 2016-12-20 DIAGNOSIS — E785 Hyperlipidemia, unspecified: Secondary | ICD-10-CM | POA: Insufficient documentation

## 2016-12-20 DIAGNOSIS — Z5181 Encounter for therapeutic drug level monitoring: Secondary | ICD-10-CM | POA: Insufficient documentation

## 2016-12-20 DIAGNOSIS — R1011 Right upper quadrant pain: Secondary | ICD-10-CM | POA: Insufficient documentation

## 2016-12-20 DIAGNOSIS — E1142 Type 2 diabetes mellitus with diabetic polyneuropathy: Secondary | ICD-10-CM | POA: Diagnosis not present

## 2016-12-20 DIAGNOSIS — M961 Postlaminectomy syndrome, not elsewhere classified: Secondary | ICD-10-CM | POA: Diagnosis not present

## 2016-12-20 DIAGNOSIS — R079 Chest pain, unspecified: Secondary | ICD-10-CM | POA: Insufficient documentation

## 2016-12-20 DIAGNOSIS — M533 Sacrococcygeal disorders, not elsewhere classified: Secondary | ICD-10-CM | POA: Diagnosis not present

## 2016-12-20 DIAGNOSIS — M792 Neuralgia and neuritis, unspecified: Secondary | ICD-10-CM

## 2016-12-20 DIAGNOSIS — M25511 Pain in right shoulder: Secondary | ICD-10-CM | POA: Diagnosis not present

## 2016-12-20 DIAGNOSIS — M48061 Spinal stenosis, lumbar region without neurogenic claudication: Secondary | ICD-10-CM | POA: Diagnosis not present

## 2016-12-20 DIAGNOSIS — Q7649 Other congenital malformations of spine, not associated with scoliosis: Secondary | ICD-10-CM | POA: Insufficient documentation

## 2016-12-20 MED ORDER — OXYCODONE HCL 10 MG PO TABS: 10.0000 mg | ORAL_TABLET | Freq: Four times a day (QID) | ORAL | 0 refills | Status: DC | PRN

## 2016-12-20 NOTE — Patient Instructions (Addendum)
____________________________________________________________________________________________  Preparing for Procedure with Sedation Instructions: . Oral Intake: Do not eat or drink anything for at least 8 hours prior to your procedure. . Transportation: Public transportation is not allowed. Bring an adult driver. The driver must be physically present in our waiting room before any procedure can be started. Marland Kitchen Physical Assistance: Bring an adult physically capable of assisting you, in the event you need help. This adult should keep you company at home for at least 6 hours after the procedure. . Blood Pressure Medicine: Take your blood pressure medicine with a sip of water the morning of the procedure. . Blood thinners:  . Diabetics on insulin: Notify the staff so that you can be scheduled 1st case in the morning. If your diabetes requires high dose insulin, take only  of your normal insulin dose the morning of the procedure and notify the staff that you have done so. . Preventing infections: Shower with an antibacterial soap the morning of your procedure. . Build-up your immune system: Take 1000 mg of Vitamin C with every meal (3 times a day) the day prior to your procedure. Marland Kitchen Antibiotics: Inform the staff if you have a condition or reason that requires you to take antibiotics before dental procedures. . Pregnancy: If you are pregnant, call and cancel the procedure. . Sickness: If you have a cold, fever, or any active infections, call and cancel the procedure. . Arrival: You must be in the facility at least 30 minutes prior to your scheduled procedure. . Children: Do not bring children with you. . Dress appropriately: Bring dark clothing that you would not mind if they get stained. . Valuables: Do not bring any jewelry or valuables. Procedure appointments are reserved for interventional treatments only. Marland Kitchen No Prescription Refills. . No medication changes will be discussed during procedure  appointments. . No disability issues will be discussed. ____________________________________________________________________________________________  Sacroiliac (SI) Joint Injection Patient Information  Description: The sacroiliac joint connects the scrum (very low back and tailbone) to the ilium (a pelvic bone which also forms half of the hip joint).  Normally this joint experiences very little motion.  When this joint becomes inflamed or unstable low back and or hip and pelvis pain may result.  Injection of this joint with local anesthetics (numbing medicines) and steroids can provide diagnostic information and reduce pain.  This injection is performed with the aid of x-ray guidance into the tailbone area while you are lying on your stomach.   You may experience an electrical sensation down the leg while this is being done.  You may also experience numbness.  We also may ask if we are reproducing your normal pain during the injection.  Conditions which may be treated SI injection:   Low back, buttock, hip or leg pain  Preparation for the Injection:  1. Do not eat any solid food or dairy products within 8 hours of your appointment.  2. You may drink clear liquids up to 3 hours before appointment.  Clear liquids include water, black coffee, juice or soda.  No milk or cream please. 3. You may take your regular medications, including pain medications with a sip of water before your appointment.  Diabetics should hold regular insulin (if take separately) and take 1/2 normal NPH dose the morning of the procedure.  Carry some sugar containing items with you to your appointment. 4. A driver must accompany you and be prepared to drive you home after your procedure. 5. Bring all of your current medications  with you. 6. An IV may be inserted and sedation may be given at the discretion of the physician. 7. A blood pressure cuff, EKG and other monitors will often be applied during the procedure.  Some  patients may need to have extra oxygen administered for a short period.  8. You will be asked to provide medical information, including your allergies, prior to the procedure.  We must know immediately if you are taking blood thinners (like Coumadin/Warfarin) or if you are allergic to IV iodine contrast (dye).  We must know if you could possible be pregnant.  Possible side effects:   Bleeding from needle site  Infection (rare, may require surgery)  Nerve injury (rare)  Numbness & tingling (temporary)  A brief convulsion or seizure  Light-headedness (temporary)  Pain at injection site (several days)  Decreased blood pressure (temporary)  Weakness in the leg (temporary)   Call if you experience:   New onset weakness or numbness of an extremity below the injection site that last more than 8 hours.  Hives or difficulty breathing ( go to the emergency room)  Inflammation or drainage at the injection site  Any new symptoms which are concerning to you  Please note:  Although the local anesthetic injected can often make your back/ hip/ buttock/ leg feel good for several hours after the injections, the pain will likely return.  It takes 3-7 days for steroids to work in the sacroiliac area.  You may not notice any pain relief for at least that one week.  If effective, we will often do a series of three injections spaced 3-6 weeks apart to maximally decrease your pain.  After the initial series, we generally will wait some months before a repeat injection of the same type.  If you have any questions, please call [phone] [place]  What are the risk, side effects and possible complications? Generally speaking, most procedures are safe.  However, with any procedure there are risks, side effects, and the possibility of complications.  The risks and complications are dependent upon the sites that are lesioned,  or the type of nerve block to be performed.  The closer the procedure is to the spine, the more serious the risks are.  Great care is taken when placing the radio frequency needles, block needles or lesioning probes, but sometimes complications can occur. 1. Infection: Any time there is an injection through the skin, there is a risk of infection.  This is why sterile conditions are used for these blocks.  There are four possible types of infection. 1. Localized skin infection. 2. Central Nervous System Infection-This can be in the form of Meningitis, which can be deadly. 3. Epidural Infections-This can be in the form of an epidural abscess, which can cause pressure inside of the spine, causing compression of the spinal cord with subsequent paralysis. This would require an emergency surgery to decompress, and there are no guarantees that the patient would recover from the paralysis. 4. Discitis-This is an infection of the intervertebral discs.  It occurs in about 1% of discography procedures.  It is difficult to treat and it may lead to surgery.        2. Pain: the needles have to go through skin and soft tissues, will cause soreness.       3. Damage to internal structures:  The nerves to be lesioned may be near blood vessels or    other nerves  which can be potentially damaged.       4. Bleeding: Bleeding is more common if the patient is taking blood thinners such as  aspirin, Coumadin, Ticiid, Plavix, etc., or if he/she have some genetic predisposition  such as hemophilia. Bleeding into the spinal canal can cause compression of the spinal  cord with subsequent paralysis.  This would require an emergency surgery to  decompress and there are no guarantees that the patient would recover from the  paralysis.       5. Pneumothorax:  Puncturing of a lung is a possibility, every time a needle is introduced in  the area of the chest or upper back.  Pneumothorax refers to free air around the  collapsed lung(s),  inside of the thoracic cavity (chest cavity).  Another two possible  complications related to a similar event would include: Hemothorax and Chylothorax.   These are variations of the Pneumothorax, where instead of air around the collapsed  lung(s), you may have blood or chyle, respectively.       6. Spinal headaches: They may occur with any procedures in the area of the spine.       7. Persistent CSF (Cerebro-Spinal Fluid) leakage: This is a rare problem, but may occur  with prolonged intrathecal or epidural catheters either due to the formation of a fistulous  track or a dural tear.       8. Nerve damage: By working so close to the spinal cord, there is always a possibility of  nerve damage, which could be as serious as a permanent spinal cord injury with  paralysis.       9. Death:  Although rare, severe deadly allergic reactions known as "Anaphylactic  reaction" can occur to any of the medications used.      10. Worsening of the symptoms:  We can always make thing worse.  What are the chances of something like this happening? Chances of any of this occuring are extremely low.  By statistics, you have more of a chance of getting killed in a motor vehicle accident: while driving to the hospital than any of the above occurring .  Nevertheless, you should be aware that they are possibilities.  In general, it is similar to taking a shower.  Everybody knows that you can slip, hit your head and get killed.  Does that mean that you should not shower again?  Nevertheless always keep in mind that statistics do not mean anything if you happen to be on the wrong side of them.  Even if a procedure has a 1 (one) in a 1,000,000 (million) chance of going wrong, it you happen to be that one..Also, keep in mind that by statistics, you have more of a chance of having something go wrong when taking medications.  Who should not have this procedure? If you are on a blood thinning medication (e.g. Coumadin, Plavix, see list  of "Blood Thinners"), or if you have an active infection going on, you should not have the procedure.  If you are taking any blood thinners, please inform your physician.  How should I prepare for this procedure?  Do not eat or drink anything at least six hours prior to the procedure.  Bring a driver with you .  It cannot be a taxi.  Come accompanied by an adult that can drive you back, and that is strong enough to help you if your legs get weak or numb from the local anesthetic.  Take all of  your medicines the morning of the procedure with just enough water to swallow them.  If you have diabetes, make sure that you are scheduled to have your procedure done first thing in the morning, whenever possible.  If you have diabetes, take only half of your insulin dose and notify our nurse that you have done so as soon as you arrive at the clinic.  If you are diabetic, but only take blood sugar pills (oral hypoglycemic), then do not take them on the morning of your procedure.  You may take them after you have had the procedure.  Do not take aspirin or any aspirin-containing medications, at least eleven (11) days prior to the procedure.  They may prolong bleeding.  Wear loose fitting clothing that may be easy to take off and that you would not mind if it got stained with Betadine or blood.  Do not wear any jewelry or perfume  Remove any nail coloring.  It will interfere with some of our monitoring equipment.  NOTE: Remember that this is not meant to be interpreted as a complete list of all possible complications.  Unforeseen problems may occur.  BLOOD THINNERS The following drugs contain aspirin or other products, which can cause increased bleeding during surgery and should not be taken for 2 weeks prior to and 1 week after surgery.  If you should need take something for relief of minor pain, you may take acetaminophen which is found in Tylenol,m Datril, Anacin-3 and Panadol. It is not blood  thinner. The products listed below are.  Do not take any of the products listed below in addition to any listed on your instruction sheet.  A.P.C or A.P.C with Codeine Codeine Phosphate Capsules #3 Ibuprofen Ridaura  ABC compound Congesprin Imuran rimadil  Advil Cope Indocin Robaxisal  Alka-Seltzer Effervescent Pain Reliever and Antacid Coricidin or Coricidin-D  Indomethacin Rufen  Alka-Seltzer plus Cold Medicine Cosprin Ketoprofen S-A-C Tablets  Anacin Analgesic Tablets or Capsules Coumadin Korlgesic Salflex  Anacin Extra Strength Analgesic tablets or capsules CP-2 Tablets Lanoril Salicylate  Anaprox Cuprimine Capsules Levenox Salocol  Anexsia-D Dalteparin Magan Salsalate  Anodynos Darvon compound Magnesium Salicylate Sine-off  Ansaid Dasin Capsules Magsal Sodium Salicylate  Anturane Depen Capsules Marnal Soma  APF Arthritis pain formula Dewitt's Pills Measurin Stanback  Argesic Dia-Gesic Meclofenamic Sulfinpyrazone  Arthritis Bayer Timed Release Aspirin Diclofenac Meclomen Sulindac  Arthritis pain formula Anacin Dicumarol Medipren Supac  Analgesic (Safety coated) Arthralgen Diffunasal Mefanamic Suprofen  Arthritis Strength Bufferin Dihydrocodeine Mepro Compound Suprol  Arthropan liquid Dopirydamole Methcarbomol with Aspirin Synalgos  ASA tablets/Enseals Disalcid Micrainin Tagament  Ascriptin Doan's Midol Talwin  Ascriptin A/D Dolene Mobidin Tanderil  Ascriptin Extra Strength Dolobid Moblgesic Ticlid  Ascriptin with Codeine Doloprin or Doloprin with Codeine Momentum Tolectin  Asperbuf Duoprin Mono-gesic Trendar  Aspergum Duradyne Motrin or Motrin IB Triminicin  Aspirin plain, buffered or enteric coated Durasal Myochrisine Trigesic  Aspirin Suppositories Easprin Nalfon Trillsate  Aspirin with Codeine Ecotrin Regular or Extra Strength Naprosyn Uracel  Atromid-S Efficin Naproxen Ursinus  Auranofin Capsules Elmiron Neocylate Vanquish  Axotal Emagrin Norgesic Verin  Azathioprine  Empirin or Empirin with Codeine Normiflo Vitamin E  Azolid Emprazil Nuprin Voltaren  Bayer Aspirin plain, buffered or children's or timed BC Tablets or powders Encaprin Orgaran Warfarin Sodium  Buff-a-Comp Enoxaparin Orudis Zorpin  Buff-a-Comp with Codeine Equegesic Os-Cal-Gesic   Buffaprin Excedrin plain, buffered or Extra Strength Oxalid   Bufferin Arthritis Strength Feldene Oxphenbutazone   Bufferin plain or Extra Strength Feldene Capsules Oxycodone with  Aspirin   Bufferin with Codeine Fenoprofen Fenoprofen Pabalate or Pabalate-SF   Buffets II Flogesic Panagesic   Buffinol plain or Extra Strength Florinal or Florinal with Codeine Panwarfarin   Buf-Tabs Flurbiprofen Penicillamine   Butalbital Compound Four-way cold tablets Penicillin   Butazolidin Fragmin Pepto-Bismol   Carbenicillin Geminisyn Percodan   Carna Arthritis Reliever Geopen Persantine   Carprofen Gold's salt Persistin   Chloramphenicol Goody's Phenylbutazone   Chloromycetin Haltrain Piroxlcam   Clmetidine heparin Plaquenil   Cllnoril Hyco-pap Ponstel   Clofibrate Hydroxy chloroquine Propoxyphen         Before stopping any of these medications, be sure to consult the physician who ordered them.  Some, such as Coumadin (Warfarin) are ordered to prevent or treat serious conditions such as "deep thrombosis", "pumonary embolisms", and other heart problems.  The amount of time that you may need off of the medication may also vary with the medication and the reason for which you were taking it.  If you are taking any of these medications, please make sure you notify your pain physician before you undergo any procedures.

## 2016-12-20 NOTE — Progress Notes (Signed)
Safety precautions to be maintained throughout the outpatient stay will include: orient to surroundings, keep bed in low position, maintain call bell within reach at all times, provide assistance with transfer out of bed and ambulation.  

## 2016-12-24 ENCOUNTER — Encounter: Payer: Self-pay | Admitting: Pain Medicine

## 2016-12-24 ENCOUNTER — Ambulatory Visit (HOSPITAL_BASED_OUTPATIENT_CLINIC_OR_DEPARTMENT_OTHER): Payer: Medicare Other | Admitting: Pain Medicine

## 2016-12-24 ENCOUNTER — Ambulatory Visit
Admission: RE | Admit: 2016-12-24 | Discharge: 2016-12-24 | Disposition: A | Discharge status: Home / Self Care | Payer: Medicare Other | Source: Ambulatory Visit | Attending: Pain Medicine | Admitting: Pain Medicine

## 2016-12-24 VITALS — BP 142/82 | HR 70 | Temp 98.7°F | Resp 14 | Ht 67.0 in | Wt 185.0 lb

## 2016-12-24 DIAGNOSIS — M545 Low back pain, unspecified: Secondary | ICD-10-CM

## 2016-12-24 DIAGNOSIS — M533 Sacrococcygeal disorders, not elsewhere classified: Secondary | ICD-10-CM | POA: Insufficient documentation

## 2016-12-24 DIAGNOSIS — Z9049 Acquired absence of other specified parts of digestive tract: Secondary | ICD-10-CM | POA: Diagnosis not present

## 2016-12-24 DIAGNOSIS — G8929 Other chronic pain: Secondary | ICD-10-CM

## 2016-12-24 DIAGNOSIS — Z9889 Other specified postprocedural states: Secondary | ICD-10-CM | POA: Insufficient documentation

## 2016-12-24 DIAGNOSIS — Q7649 Other congenital malformations of spine, not associated with scoliosis: Secondary | ICD-10-CM

## 2016-12-24 DIAGNOSIS — M47816 Spondylosis without myelopathy or radiculopathy, lumbar region: Secondary | ICD-10-CM

## 2016-12-24 MED ORDER — ROPIVACAINE HCL 2 MG/ML IJ SOLN
INTRAMUSCULAR | Status: AC
  Filled 2016-12-24: qty 10

## 2016-12-24 MED ORDER — ROPIVACAINE HCL 2 MG/ML IJ SOLN
9.0000 mL | Freq: Once | INTRAMUSCULAR | Status: AC
  Administered 2016-12-24: 9 mL via INTRA_ARTICULAR

## 2016-12-24 MED ORDER — MIDAZOLAM HCL 5 MG/5ML IJ SOLN
1.0000 mg | INTRAMUSCULAR | Status: DC | PRN
  Administered 2016-12-24: 2 mg via INTRAVENOUS

## 2016-12-24 MED ORDER — METHYLPREDNISOLONE ACETATE 40 MG/ML IJ SUSP: 40.0000 mg | Freq: Once | INTRAMUSCULAR | Status: DC

## 2016-12-24 MED ORDER — FENTANYL CITRATE (PF) 100 MCG/2ML IJ SOLN
INTRAMUSCULAR | Status: AC
  Filled 2016-12-24: qty 2

## 2016-12-24 MED ORDER — METHYLPREDNISOLONE ACETATE 40 MG/ML IJ SUSP
INTRAMUSCULAR | Status: AC
  Filled 2016-12-24: qty 1

## 2016-12-24 MED ORDER — METHYLPREDNISOLONE ACETATE 80 MG/ML IJ SUSP
80.0000 mg | Freq: Once | INTRAMUSCULAR | Status: AC
  Administered 2016-12-24: 80 mg via INTRA_ARTICULAR

## 2016-12-24 MED ORDER — SODIUM CHLORIDE 0.9 % IV SOLN
1000.0000 mL | Freq: Once | INTRAVENOUS | Status: AC
  Administered 2016-12-24: 1000 mL via INTRAVENOUS

## 2016-12-24 MED ORDER — MIDAZOLAM HCL 5 MG/5ML IJ SOLN
INTRAMUSCULAR | Status: AC
Start: 2016-12-24 — End: ?
  Filled 2016-12-24: qty 5

## 2016-12-24 MED ORDER — LIDOCAINE HCL (PF) 1.5 % IJ SOLN
20.0000 mL | Freq: Once | INTRAMUSCULAR | Status: AC
  Administered 2016-12-24: 20 mL
  Filled 2016-12-24: qty 20

## 2016-12-24 MED ORDER — METHYLPREDNISOLONE ACETATE 80 MG/ML IJ SUSP
INTRAMUSCULAR | Status: AC
  Filled 2016-12-24: qty 1

## 2016-12-24 MED ORDER — FENTANYL CITRATE (PF) 100 MCG/2ML IJ SOLN
25.0000 ug | INTRAMUSCULAR | Status: DC | PRN
Start: 2016-12-24 — End: 2016-12-24
  Administered 2016-12-24: 50 ug via INTRAVENOUS

## 2016-12-24 NOTE — Patient Instructions (Addendum)
____________________________________________________________________________________________  Post-Procedure instructions Instructions:  Apply ice: Fill a plastic sandwich bag with crushed ice. Cover it with a small towel and apply to injection site. Apply for 15 minutes then remove x 15 minutes. Repeat sequence on day of procedure, until you go to bed. The purpose is to minimize swelling and discomfort after procedure.  Apply heat: Apply heat to procedure site starting the day following the procedure. The purpose is to treat any soreness and discomfort from the procedure.  Food intake: Start with clear liquids (like water) and advance to regular food, as tolerated.   Physical activities: Keep activities to a minimum for the first 8 hours after the procedure.   Driving: If you have received any sedation, you are not allowed to drive for 24 hours after your procedure.  Blood thinner: Restart your blood thinner 6 hours after your procedure. (Only for those taking blood thinners)  Insulin: As soon as you can eat, you may resume your normal dosing schedule. (Only for those taking insulin)  Infection prevention: Keep procedure site clean and dry.  Post-procedure Pain Diary: Extremely important that this be done correctly and accurately. Recorded information will be used to determine the next step in treatment.  Pain evaluated is that of treated area only. Do not include pain from an untreated area.  Complete every hour, on the hour, for the initial 8 hours. Set an alarm to help you do this part accurately.  Do not go to sleep and have it completed later. It will not be accurate.  Follow-up appointment: Keep your follow-up appointment after the procedure. Usually 2 weeks for most procedures. (6 weeks in the case of radiofrequency.) Bring you pain diary.  Expect:  From numbing medicine (AKA: Local Anesthetics): Numbness or decrease in pain.  Onset: Full effect within 15 minutes of  injected.  Duration: It will depend on the type of local anesthetic used. On the average, 1 to 8 hours.   From steroids: Decrease in swelling or inflammation. Once inflammation is improved, relief of the pain will follow.  Onset of benefits: Depends on the amount of swelling present. The more swelling, the longer it will take for the benefits to be seen. In some cases, up to 10 days.  Duration: Steroids will stay in the system x 2 weeks. Duration of benefits will depend on multiple posibilities including persistent irritating factors.  From procedure: Some discomfort is to be expected once the numbing medicine wears off. This should be minimal if ice and heat are applied as instructed. Call if:  You experience numbness and weakness that gets worse with time, as opposed to wearing off.  New onset bowel or bladder incontinence. (Spinal procedures only)  Emergency Numbers:  [facility] business hours (Monday - Thursday, 8:00 AM - 4:00 PM) (Friday, 9:00 AM - 12:00 Noon): [phone]  After hours: [phone]  Please complete the Post Procedure Diary and bring it at your next appointment. ____________________________________________________________________________________________

## 2016-12-24 NOTE — Progress Notes (Signed)
Patient's Name: Howard Murphy  MRN: [id number]  Referring Provider: [facility]  DOB: May 02, 1956  PCP: [facility] [place]: 12/24/2016  Note by: Gaspar Cola, MD  Service setting: Ambulatory outpatient  Specialty: Interventional Pain Management  Patient type: Established  Location: [facility] (AMB) Pain Management Facility  Visit type: Interventional Procedure   Primary Reason for Visit: Interventional Pain Management Treatment. CC: Back Pain (lower)  Procedure:  Anesthesia, Analgesia, Anxiolysis:  Type: Diagnostic bilateral sacroiliac joint block into the area of the L5-S1 pseudoarthrosis.  Region: Superior Lumbosacral Region Level: PSIS (Posterior Superior Iliac Spine) Laterality: Bilateral  Type: Local Anesthesia with Moderate (Conscious) Sedation Local Anesthetic: Lidocaine 1% Route: Intravenous (IV) IV Access: Secured Sedation: Meaningful verbal contact was maintained at all times during the procedure  Indication(s): Analgesia and Anxiety  Indications: 1. Chronic sacroiliac joint pain (Bilateral) (L>R)   2. Chronic low back pain (Location of Primary Source of Pain) (Bilateral) (L>R)   3. Bertolotti's syndrome (L5-S1) (Bilateral)   4. Spondylosis of lumbar spine    Pain Score: Pre-procedure: 5 /10 Post-procedure: 4 /10  Pre-op Assessment:  Previous date of service: 12/20/16 Service provided: Med Refill Howard Murphy is a 61 y.o. (year old), male patient, seen today for interventional treatment. He  has a past surgical history that includes Cholecystectomy (2000); Shoulder surgery (Right, 2016); Colonoscopy with propofol (N/A, 04/11/2015); Esophagogastroduodenoscopy (egd) with propofol (N/A, 04/11/2015); Shoulder surgery (Left, 06/2015); and Back surgery (2015). Howard Murphy has a current medication list which includes the following prescription(s): amlodipine, celecoxib, chlorthalidone, lisinopril, omeprazole, oxycodone hcl, promethazine, rosuvastatin,  sitagliptin, and testosterone cypionate, and the following Facility-Administered Medications: fentanyl, methylprednisolone acetate, and midazolam. His primarily concern today is the Back Pain (lower)  Initial Vital Signs: Blood pressure (!) 137/94, pulse 72, temperature 98.7 F (37.1 C), resp. rate 16, height 5\' 7"  (1.702 m), weight 185 lb (83.9 kg), SpO2 96 %. BMI: 28.98 kg/m  Risk Assessment: Allergies: Reviewed. He has No Known Allergies.  Allergy Precautions: None required Coagulopathies: Reviewed. None identified.  Blood-thinner therapy: None at this time Active Infection(s): Reviewed. None identified. Howard Murphy is afebrile  Site Confirmation: Howard Murphy was asked to confirm the procedure and laterality before marking the site Procedure checklist: Completed Consent: Before the procedure and under the influence of no sedative(s), amnesic(s), or anxiolytics, the patient was informed of the treatment options, risks and possible complications. To fulfill our ethical and legal obligations, as recommended by the American Medical Association's Code of Ethics, I have informed the patient of my clinical impression; the nature and purpose of the treatment or procedure; the risks, benefits, and possible complications of the intervention; the alternatives, including doing nothing; the risk(s) and benefit(s) of the alternative treatment(s) or procedure(s); and the risk(s) and benefit(s) of doing nothing. The patient was provided information about the general risks and possible complications associated with the procedure. These may include, but are not limited to: failure to achieve desired goals, infection, bleeding, organ or nerve damage, allergic reactions, paralysis, and death. In addition, the patient was informed of those risks and complications associated to the procedure, such as failure to decrease pain; infection; bleeding; organ or nerve damage with subsequent damage to sensory, motor, and/or  autonomic systems, resulting in permanent pain, numbness, and/or weakness of one or several areas of the body; allergic reactions; (i.e.: anaphylactic reaction); and/or death. Furthermore, the patient was informed of those risks and complications associated with the medications. These include, but are not limited to: allergic  reactions (i.e.: anaphylactic or anaphylactoid reaction(s)); adrenal axis suppression; blood sugar elevation that in diabetics may result in ketoacidosis or comma; water retention that in patients with history of congestive heart failure may result in shortness of breath, pulmonary edema, and decompensation with resultant heart failure; weight gain; swelling or edema; medication-induced neural toxicity; particulate matter embolism and blood vessel occlusion with resultant organ, and/or nervous system infarction; and/or aseptic necrosis of one or more joints. Finally, the patient was informed that Medicine is not an exact science; therefore, there is also the possibility of unforeseen or unpredictable risks and/or possible complications that may result in a catastrophic outcome. The patient indicated having understood very clearly. We have given the patient no guarantees and we have made no promises. Enough time was given to the patient to ask questions, all of which were answered to the patient's satisfaction. Howard Murphy has indicated that he wanted to continue with the procedure. Attestation: I, the ordering provider, attest that I have discussed with the patient the benefits, risks, side-effects, alternatives, likelihood of achieving goals, and potential problems during recovery for the procedure that I have provided informed consent. Date: 12/24/2016; Time: 10:07 AM  Pre-Procedure Preparation:  Monitoring: As per clinic protocol. Respiration, ETCO2, SpO2, BP, heart rate and rhythm monitor placed and checked for adequate function Safety Precautions: Patient was assessed for positional  comfort and pressure points before starting the procedure. Time-out: I initiated and conducted the "Time-out" before starting the procedure, as per protocol. The patient was asked to participate by confirming the accuracy of the "Time Out" information. Verification of the correct person, site, and procedure were performed and confirmed by me, the nursing staff, and the patient. "Time-out" conducted as per Joint Commission's Universal Protocol (UP.01.01.01). "Time-out" Date & Time: 12/24/2016; 1030 hrs.  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 Approach: Posterior, paraspinal, ipsilateral approach. Area Prepped: Entire Lower Lumbosacral Region Prepping solution: ChloraPrep (2% chlorhexidine gluconate and 70% isopropyl alcohol) Safety Precautions: Aspiration looking for blood return was conducted prior to all injections. At no point did we inject any substances, as a needle was being advanced. No attempts were made at seeking any paresthesias. Safe injection practices and needle disposal techniques used. Medications properly checked for expiration dates. SDV (single dose vial) medications used. Description of the Procedure: Protocol guidelines were followed. The patient was placed in position over the procedure table. The target area was identified and the area prepped in the usual manner. Skin & deeper tissues infiltrated with local anesthetic. Appropriate amount of time allowed to pass for local anesthetics to take effect. The procedure needle was advanced under fluoroscopic guidance into the sacroiliac joint until a firm endpoint was obtained. Proper needle placement secured. Negative aspiration confirmed. Solution injected in intermittent fashion, asking for systemic symptoms every 0.5cc of injectate. The needles were then removed and the area cleansed, making sure to leave some of the prepping  solution back to take advantage of its long term bactericidal properties. Vitals:   12/24/16 1040 12/24/16 1050 12/24/16 1101 12/24/16 1111  BP: (!) 151/97 (!) 142/88 140/85 (!) 142/82  Pulse: 77 76 71 70  Resp: 14 17 12 14   Temp:      SpO2: 96% 93% 94% 94%  Weight:      Height:        Start Time: 1030 hrs. End Time: 1039 hrs. Materials:  Needle(s) Type: Regular needle Gauge: 22G Length: 3.5-in  Medication(s): We administered sodium chloride, midazolam, fentaNYL, lidocaine, methylPREDNISolone acetate, and ropivacaine (PF) 2 mg/mL (0.2%). Please see chart orders for dosing details.  Imaging Guidance (Non-Spinal):  Type of Imaging Technique: Fluoroscopy Guidance (Non-Spinal) Indication(s): Assistance in needle guidance and placement for procedures requiring needle placement in or near specific anatomical locations not easily accessible without such assistance. Exposure Time: Please see nurses notes. Contrast: Before injecting any contrast, we confirmed that the patient did not have an allergy to iodine, shellfish, or radiological contrast. Once satisfactory needle placement was completed at the desired level, radiological contrast was injected. Contrast injected under live fluoroscopy. No contrast complications. See chart for type and volume of contrast used. Fluoroscopic Guidance: I was personally present during the use of fluoroscopy. "Tunnel Vision Technique" used to obtain the best possible view of the target area. Parallax error corrected before commencing the procedure. "Direction-depth-direction" technique used to introduce the needle under continuous pulsed fluoroscopy. Once target was reached, antero-posterior, oblique, and lateral fluoroscopic projection used confirm needle placement in all planes. Images permanently stored in EMR. Interpretation: I personally interpreted the imaging intraoperatively. Adequate needle placement confirmed in multiple planes. Appropriate spread of  contrast into desired area was observed. No evidence of afferent or efferent intravascular uptake. Permanent images saved into the patient's record.  Antibiotic Prophylaxis:  Indication(s): None identified Antibiotic given: None  Post-operative Assessment:  EBL: None Complications: No immediate post-treatment complications observed by team, or reported by patient. Note: The patient tolerated the entire procedure well. A repeat set of vitals were taken after the procedure and the patient was kept under observation following institutional policy, for this type of procedure. Post-procedural neurological assessment was performed, showing return to baseline, prior to discharge. The patient was provided with post-procedure discharge instructions, including a section on how to identify potential problems. Should any problems arise concerning this procedure, the patient was given instructions to immediately contact us, at any time, without hesitation. In any case, we plan to contact the patient by telephone for a follow-up status report regarding this interventional procedure. Comments:  No additional relevant information.  Plan of Care  Disposition: Discharge home  Discharge Date & Time: 12/24/2016; 1115 hrs.  Physician-requested Follow-up:  Return for post-procedure eval (in 2 wks).  Future Appointments Date Time Provider [place]  01/14/2017 1:00 PM Milinda Pointer, MD [facility] None   Medications ordered for procedure: Meds ordered this encounter  Medications  . 0.9 %  sodium chloride infusion  . midazolam (VERSED) 5 MG/5ML injection 1-2 mg    Make sure Flumazenil is available in the pyxis when using this medication. If oversedation occurs, administer 0.2 mg IV over 15 sec. If after 45 sec no response, administer 0.2 mg again over 1 min; may repeat at 1 min intervals; not to exceed 4 doses (1 mg)  . fentaNYL (SUBLIMAZE) injection 25-50 mcg    Make sure Narcan is available in the  pyxis when using this medication. In the event of respiratory depression (RR< 8/min): Titrate NARCAN (naloxone) in increments of 0.1 to 0.2 mg IV at 2-3 minute intervals, until desired degree of reversal.  . lidocaine 1.5 % injection 20 mL    From block tray.  . methylPREDNISolone acetate (DEPO-MEDROL) injection 40 mg  . methylPREDNISolone acetate (DEPO-MEDROL) injection 80 mg  . ropivacaine (PF) 2 mg/mL (0.2%) (NAROPIN) injection 9 mL   Medications administered: We administered sodium chloride, midazolam, fentaNYL, lidocaine, methylPREDNISolone acetate, and ropivacaine (PF) 2 mg/mL (0.2%).  See the medical record for exact  dosing, route, and time of administration.  Lab-work, Procedure(s), & Referral(s) Ordered: Orders Placed This Encounter  Procedures  . SACROILIAC JOINT INJECTINS  . DG C-Arm 1-60 Min-No Report  . Informed Consent Details: Transcribe to consent form and obtain patient signature  . Provider attestation of informed consent for procedure/surgical case  . Verify informed consent  . Discharge instructions  . Follow-up   Imaging Ordered: Results for orders placed in visit on 06/25/16  DG C-Arm 1-60 Min-No Report   Narrative There is no Radiologist interpretation  for this exam.   New Prescriptions   No medications on file   Primary Care Physician: [facility] [place] Location: [facility] Note by: Gaspar Cola, MD Date: 12/24/2016; Time: 12:35 PM  Disclaimer:  Medicine is not an exact science. The only guarantee in medicine is that nothing is guaranteed. It is important to note that the decision to proceed with this intervention was based on the information collected from the patient. The Data and conclusions were drawn from the patient's questionnaire, the interview, and the physical examination. Because the information was provided in large part by the patient, it cannot be guaranteed that it has not been purposely or  unconsciously manipulated. Every effort has been made to obtain as much relevant data as possible for this evaluation. It is important to note that the conclusions that lead to this procedure are derived in large part from the available data. Always take into account that the treatment will also be dependent on availability of resources and existing treatment guidelines, considered by other Pain Management Practitioners as being common knowledge and practice, at the time of the intervention. For Medico-Legal purposes, it is also important to point out that variation in procedural techniques and pharmacological choices are the acceptable norm. The indications, contraindications, technique, and results of the above procedure should only be interpreted and judged by a Board-Certified Interventional Pain Specialist with extensive familiarity and expertise in the same exact procedure and technique.  Instructions provided at this appointment: Patient Instructions  ____________________________________________________________________________________________  Post-Procedure instructions Instructions:  Apply ice: Fill a plastic sandwich bag with crushed ice. Cover it with a small towel and apply to injection site. Apply for 15 minutes then remove x 15 minutes. Repeat sequence on day of procedure, until you go to bed. The purpose is to minimize swelling and discomfort after procedure.  Apply heat: Apply heat to procedure site starting the day following the procedure. The purpose is to treat any soreness and discomfort from the procedure.  Food intake: Start with clear liquids (like water) and advance to regular food, as tolerated.   Physical activities: Keep activities to a minimum for the first 8 hours after the procedure.   Driving: If you have received any sedation, you are not allowed to drive for 24 hours after your procedure.  Blood thinner: Restart your blood thinner 6 hours after your procedure.  (Only for those taking blood thinners)  Insulin: As soon as you can eat, you may resume your normal dosing schedule. (Only for those taking insulin)  Infection prevention: Keep procedure site clean and dry.  Post-procedure Pain Diary: Extremely important that this be done correctly and accurately. Recorded information will be used to determine the next step in treatment.  Pain evaluated is that of treated area only. Do not include pain from an untreated area.  Complete every hour, on the hour, for the initial 8 hours. Set an alarm to help you do this  part accurately.  Do not go to sleep and have it completed later. It will not be accurate.  Follow-up appointment: Keep your follow-up appointment after the procedure. Usually 2 weeks for most procedures. (6 weeks in the case of radiofrequency.) Bring you pain diary.  Expect:  From numbing medicine (AKA: Local Anesthetics): Numbness or decrease in pain.  Onset: Full effect within 15 minutes of injected.  Duration: It will depend on the type of local anesthetic used. On the average, 1 to 8 hours.   From steroids: Decrease in swelling or inflammation. Once inflammation is improved, relief of the pain will follow.  Onset of benefits: Depends on the amount of swelling present. The more swelling, the longer it will take for the benefits to be seen. In some cases, up to 10 days.  Duration: Steroids will stay in the system x 2 weeks. Duration of benefits will depend on multiple posibilities including persistent irritating factors.  From procedure: Some discomfort is to be expected once the numbing medicine wears off. This should be minimal if ice and heat are applied as instructed. Call if:  You experience numbness and weakness that gets worse with time, as opposed to wearing off.  New onset bowel or bladder incontinence. (Spinal procedures only)  Emergency Numbers:  [facility] [place] business hours (Monday - Thursday, 8:00 AM - 4:00 PM) (Friday,  9:00 AM - 12:00 Noon): [phone]  After hours: [phone]  Please complete the Post Procedure Diary and bring it at your next appointment. ____________________________________________________________________________________________

## 2016-12-24 NOTE — Progress Notes (Signed)
Safety precautions to be maintained throughout the outpatient stay will include: orient to surroundings, keep bed in low position, maintain call bell within reach at all times, provide assistance with transfer out of bed and ambulation.  

## 2016-12-25 ENCOUNTER — Telehealth: Payer: Self-pay

## 2016-12-25 NOTE — Telephone Encounter (Signed)
Post procedure phone call.  States he is doing good.  

## 2017-01-14 ENCOUNTER — Encounter: Payer: Medicare Other | Admitting: Pain Medicine

## 2017-01-24 NOTE — Progress Notes (Signed)
Patient's Name: Howard Murphy  MRN: [id number]  Referring Provider: [facility], [place]: 12-24-1955  PCP: [facility], [place]: 01/28/2017  Note by: Vevelyn Francois NP  Service setting: Ambulatory outpatient  Specialty: Interventional Pain Management  Location: [facility] (AMB) Pain Management Facility    Patient type: Established    Primary Reason(s) for Visit: Encounter for prescription drug management. (Level of risk: moderate)  CC: Back Pain (bilateral and low)  HPI  Howard Murphy is a 61 y.o. year old, male patient, who comes today for a medication management evaluation. He has History of lumbar facet Synovial cyst, surgically removed.; RA (rheumatoid arthritis) ([place]); TSpondylosis of lumbar spine; Lumbar facet syndrome (Bilateral) (L>R); Chronic hip pain (Left); Diabetic peripheral neuropathy ([place]); Chronic lumbar radicular pain (S1 Dermatome) (Bilateral) (L>R); Hypokalemia; Complete tear of the shoulder rotator cuff (Left); Chronic shoulder pain (Location of Primary Source of Pain) (Bilateral) (L>R); Chronic foot pain (Location of Secondary source of pain) (Bilateral) (L>R); Chronic hand pain (Location of Tertiary source of pain) (Bilateral) (R>L); Lumbar facet hypertrophy;Occipital pain (Right); Failed back surgical syndrome 4; Chronic low back pain (Location of Primary Source of Pain) (Bilateral) (L>R); Radicular pain of shoulder (Right); Carpal tunnel syndrome (Bilateral); Chronic cervical radicular pain (Right); Chronic sacroiliac joint pain (Bilateral) (L>R); Neurogenic pain; Cervical spondylosis; Cervical facet arthropathy; Chronic neck pain (Bilateral) (R>L); Cervical facet syndrome (Bilateral) (R>L); Chronic upper extremity pain (Bilateral) (R>L); Cervical foraminal stenosis;  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 His primarily concern today is the Back Pain (bilateral and low)  Pain Assessment: Location: Lower, Left, Right Back Radiating: denies Onset: More than a month ago Duration: Chronic pain Quality: Burning, Constant, Stabbing Severity: 4 /10 (self-reported pain score)  Note: Reported level is compatible with observation.                   Effect on ADL:   Timing: Constant Modifying factors: medications help "some"  Howard Murphy was last scheduled for an appointment on 11/29/2016 for medication management. During today's appointment we reviewed Howard Murphy's chronic pain status, as well as his outpatient medication regimen. He denies any radicular symptoms. He describes the pain as a tooth ache type sharp pain. He deneis any relief with the procedure. He did not follow up as recommended by provider. He admits that he did not get any relief post procedure and has had no relief at all. He does continue to use the Oxycodone which he states is effective.   The patient  reports that he does not use drugs. His body mass index is 29.76 kg/m.  Further details on both, my assessment(s), as well as the proposed treatment plan, please see below.  Controlled Substance Pharmacotherapy Assessment REMS (Risk Evaluation and Mitigation Strategy)  Analgesic: Oxycodone IR 10 mg 1 tablet by mouth 4 times a day (40 mg/day of oxycodone) (60 MME/Day) MME/day: 60 mg/day.  Hart Rochester, RN  01/28/2017  9:24 AM  Sign at close encounter Nursing Pain Medication Assessment:  Safety precautions to be maintained throughout the outpatient stay will include: orient to surroundings, keep bed in low position, maintain call bell within reach at all times, provide  assistance with  transfer out of bed and ambulation.  Medication Inspection Compliance: Pill count conducted under aseptic conditions, in front of the patient. Neither the pills nor the bottle was removed from the patient's sight at any time. Once count was completed pills were immediately returned to the patient in their original bottle.  Medication: Oxycodone IR Pill/Patch Count: 0 of 120 pills remain Pill/Patch Appearance: Markings consistent with prescribed medication Bottle Appearance: Standard pharmacy container. Clearly labeled. Filled Date: 07 / 12 / 2018 Last Medication intake:  Yesterday   Pharmacokinetics: Liberation and absorption (onset of action): WNL Distribution (time to peak effect): WNL Metabolism and excretion (duration of action): WNL         Pharmacodynamics: Desired effects: Analgesia: Howard Murphy reports >50% benefit. Functional ability: Patient reports that medication allows him to accomplish basic ADLs Clinically meaningful improvement in function (CMIF): Sustained CMIF goals met Perceived effectiveness: Described as relatively effective, allowing for increase in activities of daily living (ADL) Undesirable effects: Side-effects or Adverse reactions: None reported Monitoring: [place] PMP: Online review of the past 52-monthperiod conducted. Compliant with practice rules and regulations List of all UDS test(s) done:  Lab Results  Component Value Date   TOXASSSELUR FINAL 11/21/2015   TTse BonitoFINAL 10/03/2015   SUMMARY FINAL 11/29/2016   SUMMARY FINAL 09/05/2015   Last UDS on record: ToxAssure Select 13  Date Value Ref Range Status  11/21/2015 FINAL  Final    Comment:    ==================================================================== TOXASSURE SELECT 13 (MW) ==================================================================== Test                             Result       Flag       Units Drug Present and Declared for Prescription Verification   Oxycodone                       912          EXPECTED   ng/mg creat   Oxymorphone                    863          EXPECTED   ng/mg creat   Noroxycodone                   1428         EXPECTED   ng/mg creat   Noroxymorphone                 302          EXPECTED   ng/mg creat    Sources of oxycodone are scheduled prescription medications.    Oxymorphone, noroxycodone, and noroxymorphone are expected    metabolites of oxycodone. Oxymorphone is also available as a    scheduled prescription medication. ==================================================================== Test                      Result    Flag   Units      Ref Range   Creatinine              218              mg/dL      >=20 ==================================================================== Declared Medications:  The flagging and interpretation on this report are based on the  following declared medications.  Unexpected results may arise from  inaccuracies in the declared medications.  **  Note: The testing scope of this panel includes these medications:  Oxycodone  **Note: The testing scope of this panel does not include following  reported medications:  Amlodipine (Norvasc)  Chlorthalidone  Cyanocobalamin  Icosapent  Lisinopril  Naloxone  Omeprazole (Prilosec)  Ondansetron (Zofran)  Rosuvastatin (Crestor)  Sitagliptin (Januvia)  Sucralfate (Carafate)  Testosterone ==================================================================== For clinical consultation, please call [phone]. ====================================================================    Summary  Date Value Ref Range Status  11/29/2016 FINAL  Final    Comment:    ==================================================================== TOXASSURE SELECT 13 (MW) ==================================================================== Test                             Result       Flag       Units Drug Present   Oxycodone                      1718                    ng/mg  creat   Oxymorphone                    1595                    ng/mg creat   Noroxycodone                   2663                    ng/mg creat   Noroxymorphone                 589                     ng/mg creat    Sources of oxycodone are scheduled prescription medications.    Oxymorphone, noroxycodone, and noroxymorphone are expected    metabolites of oxycodone. Oxymorphone is also available as a    scheduled prescription medication. ==================================================================== Test                      Result    Flag   Units      Ref Range   Creatinine              188              mg/dL      >=20 ==================================================================== Declared Medications:  Medication list was not provided. ==================================================================== For clinical consultation, please call [phone]. ====================================================================    UDS interpretation: Compliant          Medication Assessment Form: Reviewed. Patient indicates being compliant with therapy Treatment compliance: Compliant Risk Assessment Profile: Aberrant behavior: See prior evaluations. None observed or detected today Comorbid factors increasing risk of overdose: See prior notes. No additional risks detected today Risk of substance use disorder (SUD): Low     Opioid Risk Tool - 12/24/16 0941      Family History of Substance Abuse   Alcohol Negative   Illegal Drugs Negative   Rx Drugs Negative     Personal History of Substance Abuse   Alcohol Negative   Illegal Drugs Negative   Rx Drugs Negative     Age   Age between 92-45 years  No     History of Preadolescent Sexual Abuse   History of Preadolescent Sexual Abuse Negative or Male  Psychological Disease   Psychological Disease Negative   Depression Negative     Total Score   Opioid Risk Tool Scoring 0   Opioid Risk Interpretation Low Risk      ORT Scoring interpretation table:  Score <3 = Low Risk for SUD  Score between 4-7 = Moderate Risk for SUD  Score >8 = High Risk for Opioid Abuse   Risk Mitigation Strategies:  Patient Counseling: Covered Patient-Prescriber Agreement (PPA): Present and active  Notification to other healthcare providers: Done  Pharmacologic Plan: No change in therapy, at this time  Laboratory Chemistry  Inflammation Markers (CRP: Acute Phase) (ESR: Chronic Phase) Lab Results  Component Value Date   CRP 1.8 11/29/2016   ESRSEDRATE 11 11/29/2016                 Renal Function Markers Lab Results  Component Value Date   BUN 13 12/01/2016   CREATININE 0.92 12/01/2016   GFRAA >60 12/01/2016   GFRNONAA >60 12/01/2016                 Hepatic Function Markers Lab Results  Component Value Date   AST 34 12/01/2016   ALT 27 12/01/2016   ALBUMIN 4.2 12/01/2016   ALKPHOS 56 12/01/2016                 Electrolytes Lab Results  Component Value Date   NA 133 (L) 12/01/2016   K 3.3 (L) 12/01/2016   CL 100 (L) 12/01/2016   CALCIUM 9.2 12/01/2016   MG 2.0 11/29/2016                 Neuropathy Markers Lab Results  Component Value Date   VITAMINB12 574 11/29/2016                 Bone Pathology Markers Lab Results  Component Value Date   ALKPHOS 56 12/01/2016   25OHVITD1 24 (L) 11/29/2016   25OHVITD2 <1.0 11/29/2016   25OHVITD3 24 11/29/2016   CALCIUM 9.2 12/01/2016                 Coagulation Parameters Lab Results  Component Value Date   PLT 239 12/01/2016                 Cardiovascular Markers Lab Results  Component Value Date   HGB 18.1 (H) 12/01/2016   HCT 53.8 (H) 12/01/2016                 Note: Lab results reviewed.  Recent Diagnostic Imaging Review  Dg C-arm 1-60 Min-no Report  Result Date: 12/24/2016 Fluoroscopy was utilized by the requesting physician.  No radiographic interpretation.   Note: Imaging results reviewed.          Meds   Current Meds   Medication Sig  . amLODipine (NORVASC) 5 MG tablet Take 1 tablet (5 mg total) by mouth daily.  . celecoxib (CELEBREX) 200 MG capsule Take 1 capsule (200 mg total) by mouth every 12 (twelve) hours.  . chlorthalidone (HYGROTON) 50 MG tablet Take 50 mg by mouth daily.   Marland Kitchen lisinopril (PRINIVIL,ZESTRIL) 20 MG tablet Take 1 tablet (20 mg total) by mouth daily. Reported on 12/08/2015  . omeprazole (PRILOSEC) 40 MG capsule Take 40 mg by mouth 2 (two) times daily.  . promethazine (PHENERGAN) 25 MG tablet Take 1 tablet (25 mg total) by mouth every 6 (six) hours as needed for nausea or vomiting.  . rosuvastatin (CRESTOR) 40 MG tablet Take 1  tablet (40 mg total) by mouth at bedtime.  . sitaGLIPtin (JANUVIA) 50 MG tablet Take 1 tablet (50 mg total) by mouth daily.  Marland Kitchen testosterone cypionate (DEPOTESTOSTERONE CYPIONATE) 200 MG/ML injection Inject 200 mg into the muscle every 14 (fourteen) days.    ROS  Constitutional: Denies any fever or chills Gastrointestinal: No reported hemesis, hematochezia, vomiting, or acute GI distress Musculoskeletal: Denies any acute onset joint swelling, redness, loss of ROM, or weakness Neurological: No reported episodes of acute onset apraxia, aphasia, dysarthria, agnosia, amnesia, paralysis, loss of coordination, or loss of consciousness  Allergies  Mr. Bracamonte has No Known Allergies.  [place]  Drug: Mr. Fritchman  reports that he does not use drugs. Alcohol:  reports that he does not drink alcohol. Tobacco:  reports that he has never smoked. He quit smokeless tobacco use about 4 years ago. His smokeless tobacco use included Chew. Medical:  has a past medical history of Arthritis; Back ache (05/05/2013); Diabetes mellitus without complication ([place]); GERD (gastroesophageal reflux disease); Headache; Hyperlipemia; Hypertension; Kidney stones; Nephrolithiasis; and Shortness of breath dyspnea. Surgical: Mr. Wilhoite  has a past surgical history that includes Cholecystectomy (2000);  Shoulder surgery (Right, 2016); Colonoscopy with propofol (N/A, 04/11/2015); Esophagogastroduodenoscopy (egd) with propofol (N/A, 04/11/2015); Shoulder surgery (Left, 06/2015); and Back surgery (2015). Family: family history includes Cancer in his maternal grandmother, mother, sister, and sister; Heart disease in his father; Stroke in his father.  Constitutional Exam  General appearance: Well nourished, well developed, and well hydrated. In no apparent acute distress Vitals:   01/28/17 0913  BP: 129/88  Pulse: 79  Resp: 18  Temp: 98.3 F (36.8 C)  TempSrc: Oral  SpO2: 98%  Weight: 190 lb (86.2 kg)  Height: '5\' 7"'  (1.702 m)   Lumbar Spine Area Exam  Skin & Axial Inspection: No masses, redness, or swelling Alignment: Symmetrical Functional ROM: Unrestricted ROM      Stability: No instability detected Muscle Tone/Strength: Functionally intact. No obvious neuro-muscular anomalies detected. Sensory (Neurological): Unimpaired Palpation: Complains of area being tender to palpation       Provocative Tests: Lumbar Hyperextension and rotation test: evaluation deferred today       Lumbar Lateral bending test: evaluation deferred today       Patrick's Maneuver: evaluation deferred today                    Gait & Posture Assessment  Ambulation: Unassisted Gait: Relatively normal for age and body habitus Posture: WNL   Lower Extremity Exam    Side: Right lower extremity  Side: Left lower extremity  Skin & Extremity Inspection: Skin color, temperature, and hair growth are WNL. No peripheral edema or cyanosis. No masses, redness, swelling, asymmetry, or associated skin lesions. No contractures.  Skin & Extremity Inspection: Skin color, temperature, and hair growth are WNL. No peripheral edema or cyanosis. No masses, redness, swelling, asymmetry, or associated skin lesions. No contractures.  Functional ROM: Unrestricted ROM          Functional ROM: Unrestricted ROM          Muscle Tone/Strength:  Functionally intact. No obvious neuro-muscular anomalies detected.  Muscle Tone/Strength: Functionally intact. No obvious neuro-muscular anomalies detected.  Sensory (Neurological): Unimpaired  Sensory (Neurological): Unimpaired  Palpation: No palpable anomalies  Palpation: No palpable anomalies   Assessment  Primary Diagnosis & Pertinent Problem List: The primary encounter diagnosis was  Chronic Lumbar facet joint syndrome ([place]). Diagnoses of Chronic Lumbar facet hypertrophy,  Chronic Spondylosis of lumbar spine,  and Chronic pain syndrome were also pertinent to this visit.  Status Diagnosis  Controlled Controlled Controlled 1.  Chronic Lumbar facet joint syndrome ([place])   2. Chronic Lumbar facet hypertrophy   3.  Chronic Spondylosis of lumbar spine   4. Chronic pain syndrome     Problems updated and reviewed during this visit: Problem  Lumbar facet joint syndrome ([place])   Plan of Care  Pharmacotherapy (Medications Ordered): Meds ordered this encounter  Medications  . Oxycodone HCl 10 MG TABS    Sig: Take 1 tablet (10 mg total) by mouth every 6 (six) hours as needed.    Dispense:  120 tablet    Refill:  0    Do not place this medication, or any other prescription from our practice, on "Automatic Refill". Patient may have prescription filled one day early if pharmacy is closed on scheduled refill date. Do not fill until:01/28/2017 To last until: 02/27/2017    Order Specific Question:   Supervising Provider    Answer:   Milinda Pointer [phone]   New Prescriptions   No medications on file   Medications administered today: Mr. Ariola had no medications administered during this visit. Lab-work, procedure(s), and/or referral(s): No orders of the defined types were placed in this encounter.  Imaging and/or referral(s): None  Interventional therapies: Planned, scheduled, and/or pending:   Not at this time.   Considering:   Diagnostic bilateral cervical facet block   Possible bilateral cervical facet radiofrequency ablation  Diagnostic right-sided greater occipital nerve block  Possible right-sided greater occipital nerve radiofrequency ablation  Possible right-sided peripheral nerve stimulator trial  Diagnostic right-sided cervical epidural steroid injection  Diagnostic bilateral intra-articular shoulder injection  Diagnostic bilateral suprascapular nerve block  Possible bilateral suprascapular nerve radiofrequency ablation  Diagnostic bilateral sacroiliac joint block  Possible bilateral sacroiliac joint radiofrequency ablation  Diagnostic bilateral lumbar facet block  Possible bilateral lumbar facet radiofrequency ablation  Diagnostic left L4-5 transforaminal epidural steroid injection  Diagnostic left-sided L4-5 lumbar epidural steroid injection  Diagnostic left intra-articular hip joint injection  Possible left hip joint radiofrequency ablation  Diagnostic bilateral median nerve block (carpal tunnel block)  Diagnostic right-sided celiac plexus block    Palliative PRN treatment(s):   None at this time.   Provider-requested follow-up: Return in 4 weeks (on 02/25/2017) for w/ Dr. Dossie Arbour.  Future Appointments Date Time Provider [place]  02/25/2017 1:30 PM Milinda Pointer, MD [facility] None   Primary Care Physician: [facility] [place] Location: [facility] Outpatient Pain Management Facility Note by: Vevelyn Francois NP Date: 01/28/2017; Time: 3:10 PM  Pain Score Disclaimer: We use the NRS-11 scale. This is a self-reported, subjective measurement of pain severity with only modest accuracy. It is used primarily to identify changes within a particular patient. It must be understood that outpatient pain scales are significantly less accurate that those used for research, where they can be applied under ideal controlled circumstances with minimal exposure to variables. In reality, the score is likely to be a combination of pain  intensity and pain affect, where pain affect describes the degree of emotional arousal or changes in action readiness caused by the sensory experience of pain. Factors such as social and work situation, setting, emotional state, anxiety levels, expectation, and prior pain experience may influence pain perception and show large inter-individual differences that may also be affected by time variables.  Patient instructions provided during this appointment: Patient Instructions    ____________________________________________________________________________________________  Medication Rules  Applies to: All patients receiving  prescriptions (written or electronic).  Pharmacy of record: Pharmacy where electronic prescriptions will be sent. If written prescriptions are taken to a different pharmacy, please inform the nursing staff. The pharmacy listed in the electronic medical record should be the one where you would like electronic prescriptions to be sent.  Prescription refills: Only during scheduled appointments. Applies to both, written and electronic prescriptions.  NOTE: The following applies primarily to controlled substances (Opioid* Pain Medications).   Patient's responsibilities: 1. Pain Pills: Bring all pain pills to every appointment (except for procedure appointments). 2. Pill Bottles: Bring pills in original pharmacy bottle. Always bring newest bottle. Bring bottle, even if empty. 3. Medication refills: You are responsible for knowing and keeping track of what medications you need refilled. The day before your appointment, write a list of all prescriptions that need to be refilled. Bring that list to your appointment and give it to the admitting nurse. Prescriptions will be written only during appointments. If you forget a medication, it will not be "Called in", "Faxed", or "electronically sent". You will need to get another appointment to get these prescribed. 4. Prescription Accuracy:  You are responsible for carefully inspecting your prescriptions before leaving our office. Have the discharge nurse carefully go over each prescription with you, before taking them home. Make sure that your name is accurately spelled, that your address is correct. Check the name and dose of your medication to make sure it is accurate. Check the number of pills, and the written instructions to make sure they are clear and accurate. Make sure that you are given enough medication to last until your next medication refill appointment. 5. Taking Medication: Take medication as prescribed. Never take more pills than instructed. Never take medication more frequently than prescribed. Taking less pills or less frequently is permitted and encouraged, when it comes to controlled substances (written prescriptions).  6. Inform other Doctors: Always inform, all of your healthcare providers, of all the medications you take. 7. Pain Medication from other Providers: You are not allowed to accept any additional pain medication from any other Doctor or Healthcare provider. There are two exceptions to this rule. (see below) In the event that you require additional pain medication, you are responsible for notifying us, as stated below. 8. Medication Agreement: You are responsible for carefully reading and following our Medication Agreement. This must be signed before receiving any prescriptions from our practice. Safely store a copy of your signed Agreement. Violations to the Agreement will result in no further prescriptions. (Additional copies of our Medication Agreement are available upon request.) 9. Laws, Rules, & Regulations: All patients are expected to follow all Federal and [employer], Rules, [employer]. Ignorance of the Laws does not constitute a valid excuse. The use of any illegal substances is prohibited. 10. Adopted CDC guidelines & recommendations: Target dosing levels will be at or below 60 MME/day. Use  of benzodiazepines** is not recommended.  Exceptions: There are only two exceptions to the rule of not receiving pain medications from other Healthcare Providers. 1. Exception #1 (Emergencies): In the event of an emergency (i.e.: accident requiring emergency care), you are allowed to receive additional pain medication. However, you are responsible for: As soon as you are able, call our office [phone], at any time of the day or night, and leave a message stating your name, the date and nature of the emergency, and the name and dose of the medication prescribed. In the event that your call is answered by  a member of our staff, make sure to document and save the date, time, and the name of the person that took your information.  2. Exception #2 (Planned Surgery): In the event that you are scheduled by another doctor or dentist to have any type of surgery or procedure, you are allowed (for a period no longer than 30 days), to receive additional pain medication, for the acute post-op pain. However, in this case, you are responsible for picking up a copy of our "Post-op Pain Management for Surgeons" handout, and giving it to your surgeon or dentist. This document is available at our office, and does not require an appointment to obtain it. Simply go to our office during business hours (Monday-Thursday from 8:00 AM to 4:00 PM) (Friday 8:00 AM to 12:00 Noon) or if you have a scheduled appointment with [place], prior to your surgery, and ask for it by name. In addition, you will need to provide [place] with your name, name of your surgeon, type of surgery, and date of procedure or surgery.  *Opioid medications include: morphine, codeine, oxycodone, oxymorphone, hydrocodone, hydromorphone, meperidine, tramadol, tapentadol, buprenorphine, fentanyl, methadone. **Benzodiazepine medications include: diazepam (Valium), alprazolam (Xanax), clonazepam (Klonopine), lorazepam (Ativan), clorazepate (Tranxene), chlordiazepoxide  (Librium), estazolam (Prosom), oxazepam (Serax), temazepam (Restoril), triazolam (Halcion)  ____________________________________________________________________________________________ Wt Readings from Last 4 Encounters:  01/28/17 190 lb (86.2 kg)  12/24/16 185 lb (83.9 kg)  12/20/16 185 lb (83.9 kg)  12/01/16 190 lb (86.2 kg)

## 2017-01-24 NOTE — Progress Notes (Deleted)
Patient's Name: Howard Murphy  MRN: [id number]  Referring Provider: [facility], [place]: 1955-09-09  PCP: [facility], [place]: 01/28/2017  Note by: Vevelyn Francois NP  Service setting: Ambulatory outpatient  Specialty: Interventional Pain Management  Location: [facility] (AMB) Pain Management Facility    Patient type: Established    Primary Reason(s) for Visit: Encounter for prescription drug management & post-procedure evaluation of chronic illness with mild to moderate exacerbation(Level of risk: moderate) CC: No chief complaint on file.  HPI  Howard Murphy is a 61 y.o. year old, male patient, who comes today for a post-procedure evaluation and medication management. He has Benign fibroma of prostate; Elevated hemoglobin (HCC); GERD (gastroesophageal reflux disease); Hypertension; Hyperlipidemia; History of lumbar facet Synovial cyst, surgically removed.; RA (rheumatoid arthritis) ([place]); SOB (shortness of breath); Chronic abdominal pain (RUQ); Type 2 diabetes mellitus with peripheral neuropathy ([place]); Chest pain; Hypertriglyceridemia; Opiate use (60 MME/Day); Long term prescription opiate use; Long term current use of opiate analgesic; Spondylosis of lumbar spine; Lumbar facet syndrome (Bilateral) (L>R); Chronic hip pain (Left); Diabetic peripheral neuropathy ([place]); Chronic lumbar radicular pain (S1 Dermatome) (Bilateral) (L>R); Hypokalemia; Complete tear of the shoulder rotator cuff (Left); Chronic shoulder pain (Location of Primary Source of Pain) (Bilateral) (L>R); Diabetes ([place]); Chronic foot pain (Location of Secondary source of pain) (Bilateral) (L>R); Chronic hand pain (Location of Tertiary source of pain) (Bilateral) (R>L); Lumbar facet hypertrophy; Encounter for therapeutic drug level monitoring; Encounter for pain management planning; Low testosterone; Occipital pain (Right); Failed back surgical syndrome 4; Chronic low back pain (Location of Primary Source of Pain)  (Bilateral) (L>R); Radicular pain of shoulder (Right); Carpal tunnel syndrome (Bilateral); Chronic cervical radicular pain (Right); Abnormal MRI, lumbar spine; Chronic sacroiliac joint pain (Bilateral) (L>R); Neurogenic pain; Cervical spondylosis; Cervical facet arthropathy; Chronic neck pain (Bilateral) (R>L); Cervical facet syndrome (Bilateral) (R>L); Chronic upper extremity pain (Bilateral) (R>L); Cervical foraminal stenosis;  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 His primarily concern today is the No chief complaint on file.  Pain Assessment: Location:     Radiating:   Onset:   Duration:   Quality:   Severity:  /10 (self-reported pain score)  Note: Reported level is compatible with observation.                   Effect on ADL:   Timing:   Modifying factors:    Howard Murphy was last seen on 11/29/2016 for a procedure. During today's appointment we reviewed Howard Murphy's post-procedure results, as well as his outpatient medication regimen.  Further details on both, my assessment(s), as well as the proposed treatment plan, please see below.  Controlled Substance Pharmacotherapy Assessment REMS (Risk Evaluation and Mitigation Strategy)  Analgesic: *** MME/day: *** mg/day.  No notes on file Pharmacokinetics: Liberation and absorption (onset of action): WNL Distribution (time to peak effect): WNL Metabolism and excretion (duration of action): WNL         Pharmacodynamics: Desired effects: Analgesia: Howard Murphy reports >50%  benefit. Functional ability: Patient reports that medication allows him to accomplish basic ADLs Clinically meaningful improvement in function (CMIF): Sustained CMIF goals  met Perceived effectiveness: Described as relatively effective, allowing for increase in activities of daily living (ADL) Undesirable effects: Side-effects or Adverse reactions: None reported Monitoring: [place] PMP: Online review of the past 24-monthperiod conducted. Compliant with practice rules and regulations List of all UDS test(s) done:  Lab Results  Component Value Date   TOXASSSELUR FINAL 11/21/2015   TObionFINAL 10/03/2015   SUMMARY FINAL 11/29/2016   SUMMARY FINAL 09/05/2015   Last UDS on record: ToxAssure Select 13  Date Value Ref Range Status  11/21/2015 FINAL  Final    Comment:    ==================================================================== TOXASSURE SELECT 13 (MW) ==================================================================== Test                             Result       Flag       Units Drug Present and Declared for Prescription Verification   Oxycodone                      912          EXPECTED   ng/mg creat   Oxymorphone                    863          EXPECTED   ng/mg creat   Noroxycodone                   1428         EXPECTED   ng/mg creat   Noroxymorphone                 302          EXPECTED   ng/mg creat    Sources of oxycodone are scheduled prescription medications.    Oxymorphone, noroxycodone, and noroxymorphone are expected    metabolites of oxycodone. Oxymorphone is also available as a    scheduled prescription medication. ==================================================================== Test                      Result    Flag   Units      Ref Range   Creatinine              218              mg/dL      >=20 ==================================================================== Declared Medications:  The flagging and interpretation on this report are based on the   following declared medications.  Unexpected results may arise from  inaccuracies in the declared medications.  **Note: The testing scope of this panel includes these medications:  Oxycodone  **Note: The testing scope of this panel does not include following  reported medications:  Amlodipine (Norvasc)  Chlorthalidone  Cyanocobalamin  Icosapent  Lisinopril  Naloxone  Omeprazole (Prilosec)  Ondansetron (Zofran)  Rosuvastatin (Crestor)  Sitagliptin (Januvia)  Sucralfate (Carafate)  Testosterone ==================================================================== For clinical consultation, please call [phone] ====================================================================    Summary  Date Value Ref Range Status  11/29/2016 FINAL  Final    Comment:    ==================================================================== TOXASSURE SELECT 13 (MW) ==================================================================== Test                             Result       Flag       Units Drug Present   Oxycodone  1718                    ng/mg creat   Oxymorphone                    1595                    ng/mg creat   Noroxycodone                   2663                    ng/mg creat   Noroxymorphone                 589                     ng/mg creat    Sources of oxycodone are scheduled prescription medications.    Oxymorphone, noroxycodone, and noroxymorphone are expected    metabolites of oxycodone. Oxymorphone is also available as a    scheduled prescription medication. ==================================================================== Test                      Result    Flag   Units      Ref Range   Creatinine              188              mg/dL      >=20 ==================================================================== Declared Medications:  Medication list was not  provided. ==================================================================== For clinical consultation, please call [phone]. ====================================================================    UDS interpretation: Compliant          Medication Assessment Form: Reviewed. Patient indicates being compliant with therapy Treatment compliance: Compliant Risk Assessment Profile: Aberrant behavior: See prior evaluations. None observed or detected today Comorbid factors increasing risk of overdose: See prior notes. No additional risks detected today Risk of substance use disorder (SUD): Low     Opioid Risk Tool - 12/24/16 0941      Family History of Substance Abuse   Alcohol Negative   Illegal Drugs Negative   Rx Drugs Negative     Personal History of Substance Abuse   Alcohol Negative   Illegal Drugs Negative   Rx Drugs Negative     Age   Age between 71-45 years  No     History of Preadolescent Sexual Abuse   History of Preadolescent Sexual Abuse Negative or Male     Psychological Disease   Psychological Disease Negative   Depression Negative     Total Score   Opioid Risk Tool Scoring 0   Opioid Risk Interpretation Low Risk     ORT Scoring interpretation table:  Score <3 = Low Risk for SUD  Score between 4-7 = Moderate Risk for SUD  Score >8 = High Risk for Opioid Abuse   Risk Mitigation Strategies:  Patient Counseling: Covered Patient-Prescriber Agreement (PPA): Present and active  Notification to other healthcare providers: Done  Pharmacologic Plan: No change in therapy, at this time  Post-Procedure Assessment  11/29/2016 Procedure: *** Pre-procedure pain score:        /10 Post-procedure pain score: 0/10         Influential Factors: BMI:   Intra-procedural challenges: None observed.         Assessment challenges: None detected.              Reported side-effects:  None.        Post-procedural adverse reactions or complications: None reported          Sedation: Please see nurses note. When no sedatives are used, the analgesic levels obtained are directly associated to the effectiveness of the local anesthetics. However, when sedation is provided, the level of analgesia obtained during the initial 1 hour following the intervention, is believed to be the result of a combination of factors. These factors may include, but are not limited to: 1. The effectiveness of the local anesthetics used. 2. The effects of the analgesic(s) and/or anxiolytic(s) used. 3. The degree of discomfort experienced by the patient at the time of the procedure. 4. The patients ability and reliability in recalling and recording the events. 5. The presence and influence of possible secondary gains and/or psychosocial factors. Reported result: Relief experienced during the 1st hour after the procedure:   (Ultra-Short Term Relief)            Interpretative annotation: Clinically appropriate result. Analgesia during this period is likely to be Local Anesthetic and/or IV Sedative (Analgesic/Anxiolytic) related.          Effects of local anesthetic: The analgesic effects attained during this period are directly associated to the localized infiltration of local anesthetics and therefore cary significant diagnostic value as to the etiological location, or anatomical origin, of the pain. Expected duration of relief is directly dependent on the pharmacodynamics of the local anesthetic used. Long-acting (4-6 hours) anesthetics used.  Reported result: Relief during the next 4 to 6 hour after the procedure:   (Short-Term Relief)            Interpretative annotation: Clinically appropriate result. Analgesia during this period is likely to be Local Anesthetic-related.          Long-term benefit: Defined as the period of time past the expected duration of local anesthetics (1 hour for short-acting and 4-6 hours for long-acting). With the possible exception of prolonged sympathetic blockade  from the local anesthetics, benefits during this period are typically attributed to, or associated with, other factors such as analgesic sensory neuropraxia, antiinflammatory effects, or beneficial biochemical changes provided by agents other than the local anesthetics.  Reported result: Extended relief following procedure:   (Long-Term Relief)            Interpretative annotation: Clinically appropriate result. Good relief. No permanent benefit expected. Inflammation plays a part in the etiology to the pain.          Current benefits: Defined as persistent relief that continues at this point in time.   Reported results: Treated area: *** %       Interpretative annotation: Recurrence of symptoms. No permanent benefit expected. Effective diagnostic intervention.          Interpretation: Results would suggest a successful diagnostic intervention.                  Plan:  Please see "Plan of Care" for details.  Laboratory Chemistry  Inflammation Markers (CRP: Acute Phase) (ESR: Chronic Phase) Lab Results  Component Value Date   CRP 1.8 11/29/2016   ESRSEDRATE 11 11/29/2016                 Renal Function Markers Lab Results  Component Value Date   BUN 13 12/01/2016   CREATININE 0.92 12/01/2016   GFRAA >60 12/01/2016   GFRNONAA >60 12/01/2016  Hepatic Function Markers Lab Results  Component Value Date   AST 34 12/01/2016   ALT 27 12/01/2016   ALBUMIN 4.2 12/01/2016   ALKPHOS 56 12/01/2016                 Electrolytes Lab Results  Component Value Date   NA 133 (L) 12/01/2016   K 3.3 (L) 12/01/2016   CL 100 (L) 12/01/2016   CALCIUM 9.2 12/01/2016   MG 2.0 11/29/2016                 Neuropathy Markers Lab Results  Component Value Date   LPFXTKWI09 735 11/29/2016                 Bone Pathology Markers Lab Results  Component Value Date   ALKPHOS 56 12/01/2016   25OHVITD1 24 (L) 11/29/2016   25OHVITD2 <1.0 11/29/2016   25OHVITD3 24 11/29/2016   CALCIUM  9.2 12/01/2016                 Coagulation Parameters Lab Results  Component Value Date   PLT 239 12/01/2016                 Cardiovascular Markers Lab Results  Component Value Date   HGB 18.1 (H) 12/01/2016   HCT 53.8 (H) 12/01/2016                 Note: Lab results reviewed.  Recent Diagnostic Imaging Review  Dg C-arm 1-60 Min-no Report  Result Date: 12/24/2016 Fluoroscopy was utilized by the requesting physician.  No radiographic interpretation.   Note: Imaging results reviewed.          Meds   No outpatient prescriptions have been marked as taking for the 01/28/17 encounter (Appointment) with Vevelyn Francois, NP.    ROS  Constitutional: Denies any fever or chills Gastrointestinal: No reported hemesis, hematochezia, vomiting, or acute GI distress Musculoskeletal: Denies any acute onset joint swelling, redness, loss of ROM, or weakness Neurological: No reported episodes of acute onset apraxia, aphasia, dysarthria, agnosia, amnesia, paralysis, loss of coordination, or loss of consciousness  Allergies  Mr. Lince has No Known Allergies.  [place]  Drug: Mr. Plancarte  reports that he does not use drugs. Alcohol:  reports that he does not drink alcohol. Tobacco:  reports that he has never smoked. He quit smokeless tobacco use about 4 years ago. His smokeless tobacco use included Chew. Medical:  has a past medical history of Arthritis; Back ache (05/05/2013); Diabetes mellitus without complication ([place]); GERD (gastroesophageal reflux disease); Headache; Hyperlipemia; Hypertension; Kidney stones; Nephrolithiasis; and Shortness of breath dyspnea. Surgical: Mr. Diebel  has a past surgical history that includes Cholecystectomy (2000); Shoulder surgery (Right, 2016); Colonoscopy with propofol (N/A, 04/11/2015); Esophagogastroduodenoscopy (egd) with propofol (N/A, 04/11/2015); Shoulder surgery (Left, 06/2015); and Back surgery (2015). Family: family history includes Cancer in his  maternal grandmother, mother, sister, and sister; Heart disease in his father; Stroke in his father.  Constitutional Exam  General appearance: Well nourished, well developed, and well hydrated. In no apparent acute distress There were no vitals filed for this visit. BMI Assessment: Estimated body mass index is 28.98 kg/m as calculated from the following:   Height as of 12/24/16: _0  (1.702 m).   Weight as of 12/24/16: 185 lb (83.9 kg).  BMI interpretation table: BMI level Category Range association with higher incidence of chronic pain  <18 kg/m2 Underweight   18.5-24.9 kg/m2 Ideal body weight   25-29.9 kg/m2 Overweight Increased  incidence by 20%  30-34.9 kg/m2 Obese (Class I) Increased incidence by 68%  35-39.9 kg/m2 Severe obesity (Class II) Increased incidence by 136%  >40 kg/m2 Extreme obesity (Class III) Increased incidence by 254%   BMI Readings from Last 4 Encounters:  12/24/16 28.98 kg/m  12/20/16 28.98 kg/m  12/01/16 29.76 kg/m  11/29/16 30.85 kg/m   Wt Readings from Last 4 Encounters:  12/24/16 185 lb (83.9 kg)  12/20/16 185 lb (83.9 kg)  12/01/16 190 lb (86.2 kg)  11/29/16 197 lb (89.4 kg)  Psych/Mental status: Alert, oriented x 3 (person, place, & time)       Eyes: PERLA Respiratory: No evidence of acute respiratory distress  Cervical Spine Area Exam  Skin & Axial Inspection: No masses, redness, edema, swelling, or associated skin lesions Alignment: Symmetrical Functional ROM: Unrestricted ROM      Stability: No instability detected Muscle Tone/Strength: Functionally intact. No obvious neuro-muscular anomalies detected. Sensory (Neurological): Unimpaired Palpation: No palpable anomalies              Upper Extremity (UE) Exam    Side: Right upper extremity  Side: Left upper extremity  Skin & Extremity Inspection: Skin color, temperature, and hair growth are WNL. No peripheral edema or cyanosis. No masses, redness, swelling, asymmetry, or associated skin  lesions. No contractures.  Skin & Extremity Inspection: Skin color, temperature, and hair growth are WNL. No peripheral edema or cyanosis. No masses, redness, swelling, asymmetry, or associated skin lesions. No contractures.  Functional ROM: Unrestricted ROM          Functional ROM: Unrestricted ROM          Muscle Tone/Strength: Functionally intact. No obvious neuro-muscular anomalies detected.  Muscle Tone/Strength: Functionally intact. No obvious neuro-muscular anomalies detected.  Sensory (Neurological): Unimpaired  Sensory (Neurological): Unimpaired  Palpation: No palpable anomalies              Palpation: No palpable anomalies              Specialized Test(s): Deferred         Specialized Test(s): Deferred          Thoracic Spine Area Exam  Skin & Axial Inspection: No masses, redness, or swelling Alignment: Symmetrical Functional ROM: Unrestricted ROM Stability: No instability detected Muscle Tone/Strength: Functionally intact. No obvious neuro-muscular anomalies detected. Sensory (Neurological): Unimpaired Muscle strength & Tone: No palpable anomalies  Lumbar Spine Area Exam  Skin & Axial Inspection: No masses, redness, or swelling Alignment: Symmetrical Functional ROM: Unrestricted ROM      Stability: No instability detected Muscle Tone/Strength: Functionally intact. No obvious neuro-muscular anomalies detected. Sensory (Neurological): Unimpaired Palpation: No palpable anomalies       Provocative Tests: Lumbar Hyperextension and rotation test: evaluation deferred today       Lumbar Lateral bending test: evaluation deferred today       Patrick's Maneuver: evaluation deferred today                    Gait & Posture Assessment  Ambulation: Unassisted Gait: Relatively normal for age and body habitus Posture: WNL   Lower Extremity Exam    Side: Right lower extremity  Side: Left lower extremity  Skin & Extremity Inspection: Skin color, temperature, and hair growth are WNL. No  peripheral edema or cyanosis. No masses, redness, swelling, asymmetry, or associated skin lesions. No contractures.  Skin & Extremity Inspection: Skin color, temperature, and hair growth are WNL. No peripheral edema or cyanosis. No masses, redness,  swelling, asymmetry, or associated skin lesions. No contractures.  Functional ROM: Unrestricted ROM          Functional ROM: Unrestricted ROM          Muscle Tone/Strength: Functionally intact. No obvious neuro-muscular anomalies detected.  Muscle Tone/Strength: Functionally intact. No obvious neuro-muscular anomalies detected.  Sensory (Neurological): Unimpaired  Sensory (Neurological): Unimpaired  Palpation: No palpable anomalies  Palpation: No palpable anomalies   Assessment  Primary Diagnosis & Pertinent Problem List: The encounter diagnosis was Chronic pain syndrome.  Status Diagnosis  Controlled Controlled Controlled 1. Chronic pain syndrome     Problems updated and reviewed during this visit: No problems updated. Plan of Care  Pharmacotherapy (Medications Ordered): No orders of the defined types were placed in this encounter.  New Prescriptions   No medications on file   Medications administered today: Mr. Hurta had no medications administered during this visit. Lab-work, procedure(s), and/or referral(s): No orders of the defined types were placed in this encounter.  Imaging and/or referral(s): None  Interventional management options: Planned, scheduled, and/or pending:   None at this time.   Considering:   Diagnostic bilateral cervical facet block  Possible bilateral cervical facet radiofrequency ablation  Diagnostic right-sided greater occipital nerve block  Possible right-sided greater occipital nerve radiofrequency ablation  Possible right-sided peripheral nerve stimulator trial  Diagnostic right-sided cervical epidural steroid injection  Diagnostic bilateral intra-articular shoulder injection  Diagnostic bilateral  suprascapular nerve block  Possible bilateral suprascapular nerve radiofrequency ablation  Diagnostic bilateral sacroiliac joint block  Possible bilateral sacroiliac joint radiofrequency ablation  Diagnostic bilateral lumbar facet block  Possible bilateral lumbar facet radiofrequency ablation  Diagnostic left L4-5 transforaminal epidural steroid injection  Diagnostic left-sided L4-5 lumbar epidural steroid injection  Diagnostic left intra-articular hip joint injection  Possible left hip joint radiofrequency ablation  Diagnostic bilateral median nerve block (carpal tunnel block)  Diagnostic right-sided celiac plexus block    Palliative PRN treatment(s):   None at this time.   Provider-requested follow-up: No Follow-up on file.  Future Appointments Date Time Provider [place]  01/28/2017 9:30 AM Vevelyn Francois, NP [facility] None   Primary Care Physician: [facility] [place] Location: [facility] Facility Note by: Vevelyn Francois NP Date: 01/28/2017; Time: 11:08 AM  Pain Score Disclaimer: We use the NRS-11 scale. This is a self-reported, subjective measurement of pain severity with only modest accuracy. It is used primarily to identify changes within a particular patient. It must be understood that outpatient pain scales are significantly less accurate that those used for research, where they can be applied under ideal controlled circumstances with minimal exposure to variables. In reality, the score is likely to be a combination of pain intensity and pain affect, where pain affect describes the degree of emotional arousal or changes in action readiness caused by the sensory experience of pain. Factors such as social and work situation, setting, emotional state, anxiety levels, expectation, and prior pain experience may influence pain perception and show large inter-individual differences that may also be affected by time variables.  Patient  instructions provided during this appointment: There are no Patient Instructions on file for this visit.

## 2017-01-28 ENCOUNTER — Encounter: Payer: Self-pay | Admitting: Nurse Practitioner

## 2017-01-28 ENCOUNTER — Ambulatory Visit: Payer: Medicare Other | Attending: Nurse Practitioner | Admitting: Nurse Practitioner

## 2017-01-28 VITALS — BP 129/88 | HR 79 | Temp 98.3°F | Resp 18 | Ht 67.0 in | Wt 190.0 lb

## 2017-01-28 DIAGNOSIS — G894 Chronic pain syndrome: Secondary | ICD-10-CM | POA: Diagnosis not present

## 2017-01-28 DIAGNOSIS — K219 Gastro-esophageal reflux disease without esophagitis: Secondary | ICD-10-CM | POA: Diagnosis not present

## 2017-01-28 DIAGNOSIS — M47816 Spondylosis without myelopathy or radiculopathy, lumbar region: Secondary | ICD-10-CM | POA: Diagnosis not present

## 2017-01-28 DIAGNOSIS — M47896 Other spondylosis, lumbar region: Secondary | ICD-10-CM | POA: Diagnosis not present

## 2017-01-28 DIAGNOSIS — M79642 Pain in left hand: Secondary | ICD-10-CM | POA: Insufficient documentation

## 2017-01-28 DIAGNOSIS — Z87891 Personal history of nicotine dependence: Secondary | ICD-10-CM | POA: Diagnosis not present

## 2017-01-28 DIAGNOSIS — M25512 Pain in left shoulder: Secondary | ICD-10-CM | POA: Insufficient documentation

## 2017-01-28 DIAGNOSIS — M069 Rheumatoid arthritis, unspecified: Secondary | ICD-10-CM | POA: Diagnosis not present

## 2017-01-28 DIAGNOSIS — E785 Hyperlipidemia, unspecified: Secondary | ICD-10-CM | POA: Insufficient documentation

## 2017-01-28 DIAGNOSIS — M25552 Pain in left hip: Secondary | ICD-10-CM | POA: Insufficient documentation

## 2017-01-28 DIAGNOSIS — Z9049 Acquired absence of other specified parts of digestive tract: Secondary | ICD-10-CM | POA: Insufficient documentation

## 2017-01-28 DIAGNOSIS — M47892 Other spondylosis, cervical region: Secondary | ICD-10-CM | POA: Diagnosis not present

## 2017-01-28 DIAGNOSIS — E1142 Type 2 diabetes mellitus with diabetic polyneuropathy: Secondary | ICD-10-CM | POA: Insufficient documentation

## 2017-01-28 DIAGNOSIS — M25511 Pain in right shoulder: Secondary | ICD-10-CM | POA: Insufficient documentation

## 2017-01-28 DIAGNOSIS — I10 Essential (primary) hypertension: Secondary | ICD-10-CM | POA: Insufficient documentation

## 2017-01-28 DIAGNOSIS — M4696 Unspecified inflammatory spondylopathy, lumbar region: Secondary | ICD-10-CM | POA: Diagnosis not present

## 2017-01-28 DIAGNOSIS — Z79899 Other long term (current) drug therapy: Secondary | ICD-10-CM | POA: Diagnosis not present

## 2017-01-28 DIAGNOSIS — M545 Low back pain: Secondary | ICD-10-CM | POA: Diagnosis not present

## 2017-01-28 DIAGNOSIS — M79671 Pain in right foot: Secondary | ICD-10-CM | POA: Insufficient documentation

## 2017-01-28 DIAGNOSIS — M79641 Pain in right hand: Secondary | ICD-10-CM | POA: Diagnosis not present

## 2017-01-28 DIAGNOSIS — M8938 Hypertrophy of bone, other site: Secondary | ICD-10-CM | POA: Insufficient documentation

## 2017-01-28 DIAGNOSIS — Z5181 Encounter for therapeutic drug level monitoring: Secondary | ICD-10-CM | POA: Diagnosis not present

## 2017-01-28 DIAGNOSIS — M79672 Pain in left foot: Secondary | ICD-10-CM | POA: Diagnosis not present

## 2017-01-28 MED ORDER — OXYCODONE HCL 10 MG PO TABS: 10.0000 mg | ORAL_TABLET | Freq: Four times a day (QID) | ORAL | 0 refills | Status: DC | PRN

## 2017-01-28 NOTE — Patient Instructions (Addendum)
____________________________________________________________________________________________  Medication Rules  Applies to: All patients receiving prescriptions (written or electronic).  Pharmacy of record: Pharmacy where electronic prescriptions will be sent. If written prescriptions are taken to a different pharmacy, please inform the nursing staff. The pharmacy listed in the electronic medical record should be the one where you would like electronic prescriptions to be sent.  Prescription refills: Only during scheduled appointments. Applies to both, written and electronic prescriptions.  NOTE: The following applies primarily to controlled substances (Opioid* Pain Medications).   Patient's responsibilities: 1. Pain Pills: Bring all pain pills to every appointment (except for procedure appointments). 2. Pill Bottles: Bring pills in original pharmacy bottle. Always bring newest bottle. Bring bottle, even if empty. 3. Medication refills: You are responsible for knowing and keeping track of what medications you need refilled. The day before your appointment, write a list of all prescriptions that need to be refilled. Bring that list to your appointment and give it to the admitting nurse. Prescriptions will be written only during appointments. If you forget a medication, it will not be "Called in", "Faxed", or "electronically sent". You will need to get another appointment to get these prescribed. 4. Prescription Accuracy: You are responsible for carefully inspecting your prescriptions before leaving our office. Have the discharge nurse carefully go over each prescription with you, before taking them home. Make sure that your name is accurately spelled, that your address is correct. Check the name and dose of your medication to make sure it is accurate. Check the number of pills, and the written instructions to make sure they are clear and accurate. Make sure that you are given enough medication to  last until your next medication refill appointment. 5. Taking Medication: Take medication as prescribed. Never take more pills than instructed. Never take medication more frequently than prescribed. Taking less pills or less frequently is permitted and encouraged, when it comes to controlled substances (written prescriptions).  6. Inform other Doctors: Always inform, all of your healthcare providers, of all the medications you take. 7. Pain Medication from other Providers: You are not allowed to accept any additional pain medication from any other Doctor or Healthcare provider. There are two exceptions to this rule. (see below) In the event that you require additional pain medication, you are responsible for notifying us, as stated below. 8. Medication Agreement: You are responsible for carefully reading and following our Medication Agreement. This must be signed before receiving any prescriptions from our practice. Safely store a copy of your signed Agreement. Violations to the Agreement will result in no further prescriptions. (Additional copies of our Medication Agreement are available upon request.) 9. Laws, Rules, & Regulations: All patients are expected to follow all Federal and State Laws, Statutes, Rules, & Regulations. Ignorance of the Laws does not constitute a valid excuse. The use of any illegal substances is prohibited. 10. Adopted CDC guidelines & recommendations: Target dosing levels will be at or below 60 MME/day. Use of benzodiazepines** is not recommended.  Exceptions: There are only two exceptions to the rule of not receiving pain medications from other Healthcare Providers. 1. Exception #1 (Emergencies): In the event of an emergency (i.e.: accident requiring emergency care), you are allowed to receive additional pain medication. However, you are responsible for: As soon as you are able, call our office [phone], at any time of the day or night, and leave a message stating your  name, the date and nature of the emergency, and the name and dose of the medication   prescribed. In the event that your call is answered by a member of our staff, make sure to document and save the date, time, and the name of the person that took your information.  2. Exception #2 (Planned Surgery): In the event that you are scheduled by another doctor or dentist to have any type of surgery or procedure, you are allowed (for a period no longer than 30 days), to receive additional pain medication, for the acute post-op pain. However, in this case, you are responsible for picking up a copy of our "Post-op Pain Management for Surgeons" handout, and giving it to your surgeon or dentist. This document is available at our office, and does not require an appointment to obtain it. Simply go to our office during business hours (Monday-Thursday from 8:00 AM to 4:00 PM) (Friday 8:00 AM to 12:00 Noon) or if you have a scheduled appointment with [place], prior to your surgery, and ask for it by name. In addition, you will need to provide [place] with your name, name of your surgeon, type of surgery, and date of procedure or surgery.  *Opioid medications include: morphine, codeine, oxycodone, oxymorphone, hydrocodone, hydromorphone, meperidine, tramadol, tapentadol, buprenorphine, fentanyl, methadone. **Benzodiazepine medications include: diazepam (Valium), alprazolam (Xanax), clonazepam (Klonopine), lorazepam (Ativan), clorazepate (Tranxene), chlordiazepoxide (Librium), estazolam (Prosom), oxazepam (Serax), temazepam (Restoril), triazolam (Halcion)  ____________________________________________________________________________________________ Wt Readings from Last 4 Encounters:  01/28/17 190 lb (86.2 kg)  12/24/16 185 lb (83.9 kg)  12/20/16 185 lb (83.9 kg)  12/01/16 190 lb (86.2 kg)

## 2017-01-28 NOTE — Progress Notes (Signed)
Nursing Pain Medication Assessment:  Safety precautions to be maintained throughout the outpatient stay will include: orient to surroundings, keep bed in low position, maintain call bell within reach at all times, provide assistance with transfer out of bed and ambulation.  Medication Inspection Compliance: Pill count conducted under aseptic conditions, in front of the patient. Neither the pills nor the bottle was removed from the patient's sight at any time. Once count was completed pills were immediately returned to the patient in their original bottle.  Medication: Oxycodone IR Pill/Patch Count: 0 of 120 pills remain Pill/Patch Appearance: Markings consistent with prescribed medication Bottle Appearance: Standard pharmacy container. Clearly labeled. Filled Date: 07 / 12 / 2018 Last Medication intake:  Yesterday

## 2017-01-28 NOTE — Progress Notes (Addendum)
Nursing Pain Medication Assessment:  Safety precautions to be maintained throughout the outpatient stay will include: orient to surroundings, keep bed in low position, maintain call bell within reach at all times, provide assistance with transfer out of bed and ambulation.  Medication Inspection Compliance: Pill count conducted under aseptic conditions, in front of the patient. Neither the pills nor the bottle was removed from the patient's sight at any time. Once count was completed pills were immediately returned to the patient in their original bottle.  Medication: See above Pill/Patch Count:  Pill/Patch Appearance: Markings consistent with prescribed medication Bottle Appearance: Standard pharmacy container. Clearly labeled. Filled Date:  Last Medication intake:

## 2017-01-28 NOTE — Progress Notes (Deleted)
Patient's Name: Howard Murphy  MRN: [id number]  Referring Provider: Center, [place]: [birth year]  PCP: [facility], [place]: 01/28/2017  Note by: Vevelyn Francois NP  Service setting: Ambulatory outpatient  Specialty: Interventional Pain Management  Location: [facility] (AMB) Pain Management Facility    Patient type: Established    Primary Reason(s) for Visit: Encounter for prescription drug management & post-procedure evaluation of chronic illness with mild to moderate exacerbation(Level of risk: moderate) CC: Back Pain (bilateral and low)  HPI  Howard Murphy is a 61 y.o. year old, male patient, who comes today for a post-procedure evaluation and medication management. He has Benign fibroma of prostate; Elevated hemoglobin (HCC); GERD (gastroesophageal reflux disease); Hypertension; Hyperlipidemia; History of lumbar facet Synovial cyst, surgically removed.; RA (rheumatoid arthritis) ([place]); SOB (shortness of breath); Chronic abdominal pain (RUQ); Type 2 diabetes mellitus with peripheral neuropathy ([place]); Chest pain; Hypertriglyceridemia; Opiate use (60 MME/Day); Long term prescription opiate use; Long term current use of opiate analgesic; Spondylosis of lumbar spine; Lumbar facet joint syndrome ([place]); Chronic hip pain (Left); Diabetic peripheral neuropathy ([place]); Chronic lumbar radicular pain (S1 Dermatome) (Bilateral) (L>R); Hypokalemia; Complete tear of the shoulder rotator cuff (Left); Chronic shoulder pain (Location of Primary Source of Pain) (Bilateral) (L>R); Diabetes ([place]); Chronic foot pain (Location of Secondary source of pain) (Bilateral) (L>R); Chronic hand pain (Location of Tertiary source of pain) (Bilateral) (R>L); Lumbar facet hypertrophy; Encounter for therapeutic drug level monitoring; Encounter for pain management planning; Low testosterone; Occipital pain (Right); Failed back surgical syndrome 4; Chronic low back pain (Location of Primary Source of Pain) (Bilateral)  (L>R); Radicular pain of shoulder (Right); Carpal tunnel syndrome (Bilateral); Chronic cervical radicular pain (Right); Abnormal MRI, lumbar spine; Chronic sacroiliac joint pain (Bilateral) (L>R); Neurogenic pain; Cervical spondylosis; Cervical facet arthropathy; Chronic neck pain (Bilateral) (R>L); Cervical facet syndrome (Bilateral) (R>L); Chronic upper extremity pain (Bilateral) (R>L); Cervical foraminal stenosis;  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 His primarily concern today is the Back Pain (bilateral and low)  Pain Assessment: Location: Lower, Left, Right Back Radiating: denies Onset: More than a month ago Duration: Chronic pain Quality: Burning, Constant, Stabbing Severity: 4 /10 (self-reported pain score)  Note: Reported level is compatible with observation.                   Effect on ADL:   Timing: Constant Modifying factors: medications help "some"  Howard Murphy was last seen on 07/16/ /2018 for a procedure. During today's appointment we reviewed Howard Murphy's post-procedure results, as well as his outpatient medication regimen. He has bilatteral. He denies any radicular symptoms. He describes the pain as a tooth ache type sharp pain. He deneis any relief with the procedure. He did not follow up as recommended by provider. He admits that he did not get any relief post procedure and has had no relief at all. He does continue to use the Oxycodone which he states is effective.   Further details on both, my assessment(s), as well  as the proposed treatment plan, please see below.  Controlled Substance Pharmacotherapy Assessment REMS (Risk Evaluation and Mitigation Strategy)  Analgesic: Oxycodone IR 10 mg 1 tablet by mouth 4 times a day (40 mg/day of oxycodone) (60 MME/Day) MME/day: 60 mg/day.  Howard Rochester, RN  01/28/2017  9:24 AM  Sign at close encounter Nursing Pain Medication Assessment:  Safety precautions to be maintained throughout the outpatient stay will include: orient to surroundings, keep bed in low position, maintain call bell within reach at all times, provide assistance with transfer out of bed and ambulation.  Medication Inspection Compliance: Pill count conducted under aseptic conditions, in front of the patient. Neither the pills nor the bottle was removed from the patient's sight at any time. Once count was completed pills were immediately returned to the patient in their original bottle.  Medication: Oxycodone IR Pill/Patch Count: 0 of 120 pills remain Pill/Patch Appearance: Markings consistent with prescribed medication Bottle Appearance: Standard pharmacy container. Clearly labeled. Filled Date: 07 / 12 / 2018 Last Medication intake:  Yesterday   Pharmacokinetics: Liberation and absorption (onset of action): WNL Distribution (time to peak effect): WNL Metabolism and excretion (duration of action): WNL         Pharmacodynamics: Desired effects: Analgesia: Howard Murphy reports >50% benefit. Functional ability: Patient reports that medication allows him to accomplish basic ADLs Clinically meaningful improvement in function (CMIF): Sustained CMIF goals met Perceived effectiveness: Described as relatively effective, allowing for increase in activities of daily living (ADL) Undesirable effects: Side-effects or Adverse reactions: None reported Monitoring: [place] PMP: Online review of the past 38-monthperiod conducted. Compliant with practice rules and regulations List of all UDS test(s) done:   Lab Results  Component Value Date   TOXASSSELUR FINAL 11/21/2015   TAldenFINAL 10/03/2015   SUMMARY FINAL 11/29/2016   SUMMARY FINAL 09/05/2015   Last UDS on record: ToxAssure Select 13  Date Value Ref Range Status  11/21/2015 FINAL  Final    Comment:    ==================================================================== TOXASSURE SELECT 13 (MW) ==================================================================== Test                             Result       Flag       Units Drug Present and Declared for Prescription Verification   Oxycodone                      912          EXPECTED   ng/mg creat   Oxymorphone                    863          EXPECTED   ng/mg creat   Noroxycodone                   1428         EXPECTED   ng/mg creat   Noroxymorphone                 302          EXPECTED   ng/mg creat    Sources of oxycodone are scheduled prescription medications.    Oxymorphone, noroxycodone, and noroxymorphone are expected    metabolites of oxycodone. Oxymorphone is also available as a    scheduled prescription medication. ==================================================================== Test                      Result    Flag   Units      Ref Range  Creatinine              218              mg/dL      >=20 ==================================================================== Declared Medications:  The flagging and interpretation on this report are based on the  following declared medications.  Unexpected results may arise from  inaccuracies in the declared medications.  **Note: The testing scope of this panel includes these medications:  Oxycodone  **Note: The testing scope of this panel does not include following  reported medications:  Amlodipine (Norvasc)  Chlorthalidone  Cyanocobalamin  Icosapent  Lisinopril  Naloxone  Omeprazole (Prilosec)  Ondansetron (Zofran)  Rosuvastatin (Crestor)  Sitagliptin (Januvia)  Sucralfate (Carafate)   Testosterone ==================================================================== For clinical consultation, please call [phone]. ====================================================================    Summary  Date Value Ref Range Status  11/29/2016 FINAL  Final    Comment:    ==================================================================== TOXASSURE SELECT 13 (MW) ==================================================================== Test                             Result       Flag       Units Drug Present   Oxycodone                      1718                    ng/mg creat   Oxymorphone                    1595                    ng/mg creat   Noroxycodone                   2663                    ng/mg creat   Noroxymorphone                 589                     ng/mg creat    Sources of oxycodone are scheduled prescription medications.    Oxymorphone, noroxycodone, and noroxymorphone are expected    metabolites of oxycodone. Oxymorphone is also available as a    scheduled prescription medication. ==================================================================== Test                      Result    Flag   Units      Ref Range   Creatinine              188              mg/dL      >=20 ==================================================================== Declared Medications:  Medication list was not provided. ==================================================================== For clinical consultation, please call [phone]. ====================================================================    UDS interpretation: Compliant          Medication Assessment Form: Reviewed. Patient indicates being compliant with therapy Treatment compliance: Compliant Risk Assessment Profile: Aberrant behavior: See prior evaluations. None observed or detected today Comorbid factors increasing risk of overdose: See prior notes. No additional risks detected today Risk of  substance use disorder (SUD): Low     Opioid Risk Tool - 12/24/16 0941      Family History of Substance Abuse   Alcohol Negative  Illegal Drugs Negative   Rx Drugs Negative     Personal History of Substance Abuse   Alcohol Negative   Illegal Drugs Negative   Rx Drugs Negative     Age   Age between 43-45 years  No     History of Preadolescent Sexual Abuse   History of Preadolescent Sexual Abuse Negative or Male     Psychological Disease   Psychological Disease Negative   Depression Negative     Total Score   Opioid Risk Tool Scoring 0   Opioid Risk Interpretation Low Risk     ORT Scoring interpretation table:  Score <3 = Low Risk for SUD  Score between 4-7 = Moderate Risk for SUD  Score >8 = High Risk for Opioid Abuse   Risk Mitigation Strategies:  Patient Counseling: Covered Patient-Prescriber Agreement (PPA): Present and active  Notification to other healthcare providers: Done  Pharmacologic Plan: No change in therapy, at this time  Post-Procedure Assessment  11/29/2016 Procedure: *** Pre-procedure pain score:  5/10 Post-procedure pain score: 4/10 No initial benefitt patient does not feel like he was even sedated enough for the procedure.. Influential Factors: BMI: 29.76 kg/m Intra-procedural challenges: None observed.         Assessment challenges: None detected.              Reported side-effects: None.        Post-procedural adverse reactions or complications: None reported         Sedation: Please see nurses note. When no sedatives are used, the analgesic levels obtained are directly associated to the effectiveness of the local anesthetics. However, when sedation is provided, the level of analgesia obtained during the initial 1 hour following the intervention, is believed to be the result of a combination of factors. These factors may include, but are not limited to: 1. The effectiveness of the local anesthetics used. 2. The effects of the analgesic(s)  and/or anxiolytic(s) used. 3. The degree of discomfort experienced by the patient at the time of the procedure. 4. The patients ability and reliability in recalling and recording the events. 5. The presence and influence of possible secondary gains and/or psychosocial factors. Reported result: Relief experienced during the 1st hour after the procedure:   (Ultra-Short Term Relief)            Interpretative annotation: Clinically appropriate result. Analgesia during this period is likely to be Local Anesthetic and/or IV Sedative (Analgesic/Anxiolytic) related.          Effects of local anesthetic: The analgesic effects attained during this period are directly associated to the localized infiltration of local anesthetics and therefore cary significant diagnostic value as to the etiological location, or anatomical origin, of the pain. Expected duration of relief is directly dependent on the pharmacodynamics of the local anesthetic used. Long-acting (4-6 hours) anesthetics used.  Reported result: Relief during the next 4 to 6 hour after the procedure:   (Short-Term Relief)            Interpretative annotation: Clinically appropriate result. Analgesia during this period is likely to be Local Anesthetic-related.          Long-term benefit: Defined as the period of time past the expected duration of local anesthetics (1 hour for short-acting and 4-6 hours for long-acting). With the possible exception of prolonged sympathetic blockade from the local anesthetics, benefits during this period are typically attributed to, or associated with, other factors such as analgesic sensory neuropraxia, antiinflammatory effects, or beneficial biochemical  changes provided by agents other than the local anesthetics.  Reported result: Extended relief following procedure:   (Long-Term Relief)            Interpretative annotation: Clinically appropriate result. Good relief. No permanent benefit expected. Inflammation plays a part  in the etiology to the pain.          Current benefits: Defined as persistent relief that continues at this point in time.   Reported results: Treated area: *** %       Interpretative annotation: Recurrence of symptoms. No permanent benefit expected. Effective diagnostic intervention.          Interpretation: Results would suggest a successful diagnostic intervention.                  Plan:  Please see "Plan of Care" for details.  Laboratory Chemistry  Inflammation Markers (CRP: Acute Phase) (ESR: Chronic Phase) Lab Results  Component Value Date   CRP 1.8 11/29/2016   ESRSEDRATE 11 11/29/2016                 Renal Function Markers Lab Results  Component Value Date   BUN 13 12/01/2016   CREATININE 0.92 12/01/2016   GFRAA >60 12/01/2016   GFRNONAA >60 12/01/2016                 Hepatic Function Markers Lab Results  Component Value Date   AST 34 12/01/2016   ALT 27 12/01/2016   ALBUMIN 4.2 12/01/2016   ALKPHOS 56 12/01/2016                 Electrolytes Lab Results  Component Value Date   NA 133 (L) 12/01/2016   K 3.3 (L) 12/01/2016   CL 100 (L) 12/01/2016   CALCIUM 9.2 12/01/2016   MG 2.0 11/29/2016                 Neuropathy Markers Lab Results  Component Value Date   FYBOFBPZ02 585 11/29/2016                 Bone Pathology Markers Lab Results  Component Value Date   ALKPHOS 56 12/01/2016   25OHVITD1 24 (L) 11/29/2016   25OHVITD2 <1.0 11/29/2016   25OHVITD3 24 11/29/2016   CALCIUM 9.2 12/01/2016                 Coagulation Parameters Lab Results  Component Value Date   PLT 239 12/01/2016                 Cardiovascular Markers Lab Results  Component Value Date   HGB 18.1 (H) 12/01/2016   HCT 53.8 (H) 12/01/2016                 Note: Lab results reviewed.  Recent Diagnostic Imaging Review  Dg C-arm 1-60 Min-no Report  Result Date: 12/24/2016 Fluoroscopy was utilized by the requesting physician.  No radiographic interpretation.   Note:  Imaging results reviewed.          Meds   Current Meds  Medication Sig  . amLODipine (NORVASC) 5 MG tablet Take 1 tablet (5 mg total) by mouth daily.  . celecoxib (CELEBREX) 200 MG capsule Take 1 capsule (200 mg total) by mouth every 12 (twelve) hours.  . chlorthalidone (HYGROTON) 50 MG tablet Take 50 mg by mouth daily.   Marland Kitchen lisinopril (PRINIVIL,ZESTRIL) 20 MG tablet Take 1 tablet (20 mg total) by mouth daily. Reported on 12/08/2015  . omeprazole (PRILOSEC)  40 MG capsule Take 40 mg by mouth 2 (two) times daily.  . promethazine (PHENERGAN) 25 MG tablet Take 1 tablet (25 mg total) by mouth every 6 (six) hours as needed for nausea or vomiting.  . rosuvastatin (CRESTOR) 40 MG tablet Take 1 tablet (40 mg total) by mouth at bedtime.  . sitaGLIPtin (JANUVIA) 50 MG tablet Take 1 tablet (50 mg total) by mouth daily.  Marland Kitchen testosterone cypionate (DEPOTESTOSTERONE CYPIONATE) 200 MG/ML injection Inject 200 mg into the muscle every 14 (fourteen) days.    ROS  Constitutional: Denies any fever or chills Gastrointestinal: No reported hemesis, hematochezia, vomiting, or acute GI distress Musculoskeletal: Denies any acute onset joint swelling, redness, loss of ROM, or weakness Neurological: No reported episodes of acute onset apraxia, aphasia, dysarthria, agnosia, amnesia, paralysis, loss of coordination, or loss of consciousness  Allergies  Howard Murphy has No Known Allergies.  [place]  Drug: Howard Murphy  reports that he does not use drugs. Alcohol:  reports that he does not drink alcohol. Tobacco:  reports that he has never smoked. He quit smokeless tobacco use about 4 years ago. His smokeless tobacco use included Chew. Medical:  has a past medical history of Arthritis; Back ache (05/05/2013); Diabetes mellitus without complication ([place]); GERD (gastroesophageal reflux disease); Headache; Hyperlipemia; Hypertension; Kidney stones; Nephrolithiasis; and Shortness of breath dyspnea. Surgical: Howard Murphy  has a  past surgical history that includes Cholecystectomy (2000); Shoulder surgery (Right, 2016); Colonoscopy with propofol (N/A, 04/11/2015); Esophagogastroduodenoscopy (egd) with propofol (N/A, 04/11/2015); Shoulder surgery (Left, 06/2015); and Back surgery (2015). Family: family history includes Cancer in his maternal grandmother, mother, sister, and sister; Heart disease in his father; Stroke in his father.  Constitutional Exam  General appearance: Well nourished, well developed, and well hydrated. In no apparent acute distress Vitals:   01/28/17 0913  BP: 129/88  Pulse: 79  Resp: 18  Temp: 98.3 F (36.8 C)  TempSrc: Oral  SpO2: 98%  Weight: 190 lb (86.2 kg)  Height: '5\' 7"'  (1.702 m)   BMI Assessment: Estimated body mass index is 29.76 kg/m as calculated from the following:   Height as of this encounter: '5\' 7"'  (1.702 m).   Weight as of this encounter: 190 lb (86.2 kg).  Psych/Mental status: Alert, oriented x 3 (person, place, & time)       Eyes: PERLA Respiratory: No evidence of acute respiratory distress  Lumbar Spine Area Exam  Skin & Axial Inspection: Well healed scar from previous spine surgery detected Alignment: Symmetrical Functional ROM: Unrestricted ROM      Stability: No instability detected Muscle Tone/Strength: Functionally intact. No obvious neuro-muscular anomalies detected. Sensory (Neurological): Unimpaired Palpation: Complains of area being tender to palpation       Provocative Tests: Lumbar Hyperextension and rotation test: evaluation deferred today       Lumbar Lateral bending test: evaluation deferred today       Patrick's Maneuver: evaluation deferred today                    Gait & Posture Assessment  Ambulation: Unassisted Gait: Relatively normal for age and body habitus Posture: WNL   Lower Extremity Exam    Side: Right lower extremity  Side: Left lower extremity  Skin & Extremity Inspection: Skin color, temperature, and hair growth are WNL. No  peripheral edema or cyanosis. No masses, redness, swelling, asymmetry, or associated skin lesions. No contractures.  Skin & Extremity Inspection: Skin color, temperature, and hair growth are WNL. No peripheral  edema or cyanosis. No masses, redness, swelling, asymmetry, or associated skin lesions. No contractures.  Functional ROM: Unrestricted ROM          Functional ROM: Unrestricted ROM          Muscle Tone/Strength: Functionally intact. No obvious neuro-muscular anomalies detected.  Muscle Tone/Strength: Functionally intact. No obvious neuro-muscular anomalies detected.  Sensory (Neurological): Unimpaired  Sensory (Neurological): Unimpaired  Palpation: No palpable anomalies  Palpation: No palpable anomalies   Assessment  Primary Diagnosis & Pertinent Problem List: The primary encounter diagnosis was  Chronic Lumbar facet joint syndrome ([place]). Diagnoses of Chronic Lumbar facet hypertrophy,  Chronic Spondylosis of lumbar spine, and Chronic pain syndrome were also pertinent to this visit.  Status Diagnosis  Controlled Controlled Controlled 1.  Chronic Lumbar facet joint syndrome ([place])   2. Chronic Lumbar facet hypertrophy   3.  Chronic Spondylosis of lumbar spine   4. Chronic pain syndrome     Problems updated and reviewed during this visit: Problem  Lumbar facet joint syndrome ([place])   Plan of Care  Pharmacotherapy (Medications Ordered): Meds ordered this encounter  Medications  . Oxycodone HCl 10 MG TABS    Sig: Take 1 tablet (10 mg total) by mouth every 6 (six) hours as needed.    Dispense:  120 tablet    Refill:  0    Do not place this medication, or any other prescription from our practice, on "Automatic Refill". Patient may have prescription filled one day early if pharmacy is closed on scheduled refill date. Do not fill until:01/28/2017 To last until: 02/27/2017    Order Specific Question:   Supervising Provider    Answer:   Milinda Pointer [phone]   New Prescriptions   No  medications on file   Medications administered today: Howard Murphy had no medications administered during this visit. Lab-work, procedure(s), and/or referral(s): No orders of the defined types were placed in this encounter.  Imaging and/or referral(s): None  Interventional therapies: Planned, scheduled, and/or pending:   Not at this time. Instructed patient on the importance of post procedure follow up.   Considering:   Diagnostic bilateral cervical facet block  Possible bilateral cervical facet radiofrequency ablation  Diagnostic right-sided greater occipital nerve block  Possible right-sided greater occipital nerve radiofrequency ablation  Possible right-sided peripheral nerve stimulator trial  Diagnostic right-sided cervical epidural steroid injection  Diagnostic bilateral intra-articular shoulder injection  Diagnostic bilateral suprascapular nerve block  Possible bilateral suprascapular nerve radiofrequency ablation  Diagnostic bilateral sacroiliac joint block  Possible bilateral sacroiliac joint radiofrequency ablation  Diagnostic bilateral lumbar facet block  Possible bilateral lumbar facet radiofrequency ablation  Diagnostic left L4-5 transforaminal epidural steroid injection  Diagnostic left-sided L4-5 lumbar epidural steroid injection  Diagnostic left intra-articular hip joint injection  Possible left hip joint radiofrequency ablation  Diagnostic bilateral median nerve block (carpal tunnel block)  Diagnostic right-sided celiac plexus block    Palliative PRN treatment(s):   None at this time.   Provider-requested follow-up: Return in 4 weeks (on 02/25/2017) for w/ Dr. Dossie Arbour.  Future Appointments Date Time Provider [place]  02/25/2017 1:30 PM Milinda Pointer, MD [facility] None   Primary Care Physician: Center, [place] Location: [facility] Outpatient Pain Management Facility Note by: Vevelyn Francois NP Date: 01/28/2017; Time: 2:17 PM  Pain  Score Disclaimer: We use the NRS-11 scale. This is a self-reported, subjective measurement of pain severity with only modest accuracy. It is used primarily to identify changes within a particular patient. It must  be understood that outpatient pain scales are significantly less accurate that those used for research, where they can be applied under ideal controlled circumstances with minimal exposure to variables. In reality, the score is likely to be a combination of pain intensity and pain affect, where pain affect describes the degree of emotional arousal or changes in action readiness caused by the sensory experience of pain. Factors such as social and work situation, setting, emotional state, anxiety levels, expectation, and prior pain experience may influence pain perception and show large inter-individual differences that may also be affected by time variables.  Patient instructions provided during this appointment: Patient Instructions    ____________________________________________________________________________________________  Medication Rules  Applies to: All patients receiving prescriptions (written or electronic).  Pharmacy of record: Pharmacy where electronic prescriptions will be sent. If written prescriptions are taken to a different pharmacy, please inform the nursing staff. The pharmacy listed in the electronic medical record should be the one where you would like electronic prescriptions to be sent.  Prescription refills: Only during scheduled appointments. Applies to both, written and electronic prescriptions.  NOTE: The following applies primarily to controlled substances (Opioid* Pain Medications).   Patient's responsibilities: 1. Pain Pills: Bring all pain pills to every appointment (except for procedure appointments). 2. Pill Bottles: Bring pills in original pharmacy bottle. Always bring newest bottle. Bring bottle, even if empty. 3. Medication refills: You are  responsible for knowing and keeping track of what medications you need refilled. The day before your appointment, write a list of all prescriptions that need to be refilled. Bring that list to your appointment and give it to the admitting nurse. Prescriptions will be written only during appointments. If you forget a medication, it will not be "Called in", "Faxed", or "electronically sent". You will need to get another appointment to get these prescribed. 4. Prescription Accuracy: You are responsible for carefully inspecting your prescriptions before leaving our office. Have the discharge nurse carefully go over each prescription with you, before taking them home. Make sure that your name is accurately spelled, that your address is correct. Check the name and dose of your medication to make sure it is accurate. Check the number of pills, and the written instructions to make sure they are clear and accurate. Make sure that you are given enough medication to last until your next medication refill appointment. 5. Taking Medication: Take medication as prescribed. Never take more pills than instructed. Never take medication more frequently than prescribed. Taking less pills or less frequently is permitted and encouraged, when it comes to controlled substances (written prescriptions).  6. Inform other Doctors: Always inform, all of your healthcare providers, of all the medications you take. 7. Pain Medication from other Providers: You are not allowed to accept any additional pain medication from any other Doctor or Healthcare provider. There are two exceptions to this rule. (see below) In the event that you require additional pain medication, you are responsible for notifying us, as stated below. 8. Medication Agreement: You are responsible for carefully reading and following our Medication Agreement. This must be signed before receiving any prescriptions from our practice. Safely store a copy of your signed  Agreement. Violations to the Agreement will result in no further prescriptions. (Additional copies of our Medication Agreement are available upon request.) 9. Laws, Rules, & Regulations: All patients are expected to follow all Federal and [employer], [employer], Rules, [employer]. Ignorance of the Laws does not constitute a valid excuse. The use of any illegal substances  is prohibited. 10. Adopted CDC guidelines & recommendations: Target dosing levels will be at or below 60 MME/day. Use of benzodiazepines** is not recommended.  Exceptions: There are only two exceptions to the rule of not receiving pain medications from other Healthcare Providers. 1. Exception #1 (Emergencies): In the event of an emergency (i.e.: accident requiring emergency care), you are allowed to receive additional pain medication. However, you are responsible for: As soon as you are able, call our office [phone], at any time of the day or night, and leave a message stating your name, the date and nature of the emergency, and the name and dose of the medication prescribed. In the event that your call is answered by a member of our staff, make sure to document and save the date, time, and the name of the person that took your information.  2. Exception #2 (Planned Surgery): In the event that you are scheduled by another doctor or dentist to have any type of surgery or procedure, you are allowed (for a period no longer than 30 days), to receive additional pain medication, for the acute post-op pain. However, in this case, you are responsible for picking up a copy of our "Post-op Pain Management for Surgeons" handout, and giving it to your surgeon or dentist. This document is available at our office, and does not require an appointment to obtain it. Simply go to our office during business hours (Monday-Thursday from 8:00 AM to 4:00 PM) (Friday 8:00 AM to 12:00 Noon) or if you have a scheduled appointment with [place], prior to your surgery,  and ask for it by name. In addition, you will need to provide [place] with your name, name of your surgeon, type of surgery, and date of procedure or surgery.  *Opioid medications include: morphine, codeine, oxycodone, oxymorphone, hydrocodone, hydromorphone, meperidine, tramadol, tapentadol, buprenorphine, fentanyl, methadone. **Benzodiazepine medications include: diazepam (Valium), alprazolam (Xanax), clonazepam (Klonopine), lorazepam (Ativan), clorazepate (Tranxene), chlordiazepoxide (Librium), estazolam (Prosom), oxazepam (Serax), temazepam (Restoril), triazolam (Halcion)  ____________________________________________________________________________________________ Wt Readings from Last 4 Encounters:  01/28/17 190 lb (86.2 kg)  12/24/16 185 lb (83.9 kg)  12/20/16 185 lb (83.9 kg)  12/01/16 190 lb (86.2 kg)

## 2017-01-28 NOTE — Progress Notes (Signed)
Nursing Pain Medication Assessment:  Safety precautions to be maintained throughout the outpatient stay will include: orient to surroundings, keep bed in low position, maintain call bell within reach at all times, provide assistance with transfer out of bed and ambulation.  Medication Inspection Compliance: Pill count conducted under aseptic conditions, in front of the patient. Neither the pills nor the bottle was removed from the patient's sight at any time. Once count was completed pills were immediately returned to the patient in their original bottle.  Medication: See above Pill/Patch Count: 0 of 120 pills remain Pill/Patch Appearance: Markings consistent with prescribed medication Bottle Appearance: Standard pharmacy container. Clearly labeled. Filled Date: 07 / 12 / 2018 Last Medication intake:  Yesterday

## 2017-02-18 ENCOUNTER — Emergency Department
Admission: EM | Admit: 2017-02-18 | Discharge: 2017-02-18 | Disposition: A | Discharge status: Home / Self Care | Payer: Medicare Other | Attending: Emergency Medicine | Admitting: Emergency Medicine

## 2017-02-18 ENCOUNTER — Emergency Department: Payer: Medicare Other

## 2017-02-18 DIAGNOSIS — Z87891 Personal history of nicotine dependence: Secondary | ICD-10-CM | POA: Insufficient documentation

## 2017-02-18 DIAGNOSIS — I10 Essential (primary) hypertension: Secondary | ICD-10-CM | POA: Diagnosis not present

## 2017-02-18 DIAGNOSIS — Z79899 Other long term (current) drug therapy: Secondary | ICD-10-CM | POA: Diagnosis not present

## 2017-02-18 DIAGNOSIS — R0602 Shortness of breath: Secondary | ICD-10-CM | POA: Diagnosis not present

## 2017-02-18 DIAGNOSIS — Z7984 Long term (current) use of oral hypoglycemic drugs: Secondary | ICD-10-CM | POA: Diagnosis not present

## 2017-02-18 DIAGNOSIS — R079 Chest pain, unspecified: Secondary | ICD-10-CM | POA: Diagnosis not present

## 2017-02-18 DIAGNOSIS — R0789 Other chest pain: Secondary | ICD-10-CM | POA: Diagnosis not present

## 2017-02-18 DIAGNOSIS — E1140 Type 2 diabetes mellitus with diabetic neuropathy, unspecified: Secondary | ICD-10-CM | POA: Insufficient documentation

## 2017-02-18 LAB — TROPONIN I: Troponin I: <0.03 ng/mL (ref <0.03)

## 2017-02-18 LAB — CBC
HCT: 49.1 % (ref 40.0–52.0)
Hemoglobin: 16.6 g/dL (ref 13.0–18.0)
MCH: 28.7 pg (ref 26.0–34.0)
MCHC: 33.9 g/dL (ref 32.0–36.0)
MCV: 84.8 fL (ref 80.0–100.0)
Platelets: 202 10*3/uL (ref 150–440)
RBC: 5.79 MIL/uL (ref 4.40–5.90)
RDW: 14.5 % (ref 11.5–14.5)
WBC: 9.3 10*3/uL (ref 3.8–10.6)

## 2017-02-18 LAB — BASIC METABOLIC PANEL
Anion gap: 7 (ref 5–15)
BUN: 9 mg/dL (ref 6–20)
CALCIUM: 8.8 mg/dL — AB (ref 8.9–10.3)
CO2: 25 mmol/L (ref 22–32)
Chloride: 104 mmol/L (ref 101–111)
Creatinine, Ser: 0.91 mg/dL (ref 0.61–1.24)
Glucose, Bld: 192 mg/dL — ABNORMAL HIGH (ref 65–99)
Potassium: 3.5 mmol/L (ref 3.5–5.1)
SODIUM: 136 mmol/L (ref 135–145)

## 2017-02-18 MED ORDER — ALBUTEROL SULFATE (2.5 MG/3ML) 0.083% IN NEBU
2.5000 mg | INHALATION_SOLUTION | Freq: Once | RESPIRATORY_TRACT | Status: AC
  Administered 2017-02-18: 2.5 mg via RESPIRATORY_TRACT
  Filled 2017-02-18: qty 3

## 2017-02-18 MED ORDER — FAMOTIDINE 20 MG PO TABS
20.0000 mg | ORAL_TABLET | Freq: Once | ORAL | Status: AC
  Administered 2017-02-18: 20 mg via ORAL
  Filled 2017-02-18: qty 1

## 2017-02-18 MED ORDER — ALUM & MAG HYDROXIDE-SIMETH 200-200-20 MG/5ML PO SUSP
30.0000 mL | Freq: Once | ORAL | Status: AC
Start: 2017-02-18 — End: 2017-02-18
  Administered 2017-02-18: 30 mL via ORAL
  Filled 2017-02-18: qty 30

## 2017-02-18 NOTE — ED Provider Notes (Signed)
[facility] Emergency Department Provider Note ____________________________________________   First MD Initiated Contact with Patient 02/18/17 1740     (approximate)  I have reviewed the triage vital signs and the nursing notes.   HISTORY  Chief Complaint Chest Pain    HPI Howard Murphy is a 61 y.o. male History of hypertension, diabetes, hyperlipidemia, and GERD who presents with chest pain for 3 days, sternal, nonradiating, described as heaviness, and associated with shortness of breath. Patient states pain is not exertional and is constant. He states the symptoms started after he was working outside and felt like his head swelled up; he states that this is now improved. Patient denies cough or fever. He reports some diarrhea but no vomiting or abdominal pain. Also reports chronic lower back pain. No leg pain or swelling. No recent immobilization.  Past Medical History:  Diagnosis Date  . 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  . Back ache 05/05/2013  . Diabetes mellitus without complication ([place])   . GERD (gastroesophageal reflux disease)   . Headache   . Hyperlipemia   . Hypertension   . Kidney stones   . Nephrolithiasis   . Shortness of breath dyspnea     Patient Active Problem List   Diagnosis Date Noted  . 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 Spondylolisthesis at L3-L4 level 08/03/2016  . Chronic pain syndrome 07/09/2016  . Encounter for medication monitoring 02/24/2016  . Cervical spondylosis 12/17/2015  . Cervical facet arthropathy 12/17/2015  . Chronic neck pain (Bilateral) (R>L) 12/17/2015  . Cervical facet syndrome (Bilateral) (R>L) 12/17/2015  . Chronic upper extremity pain (Bilateral) (R>L) 12/17/2015  . Cervical foraminal stenosis 12/17/2015  .  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 Arthropathy of shoulder (Right) 12/17/2015  . Osteoarthritis 12/17/2015  . 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 Lumbar foraminal stenosis (L4-5) (Left) 12/17/2015  . Grade 1 Anterolisthesis (2 mm) of L4 over L5. 12/17/2015  . Lumbar facet (L4-5) synovial cyst (6 mm) (Left) 12/17/2015  . Chronic sacroiliac joint pain (Bilateral) (L>R) 12/08/2015  . Neurogenic pain 12/08/2015  . Abnormal MRI, lumbar spine 11/21/2015  . Radicular pain of shoulder (Right) 10/03/2015  . Carpal tunnel syndrome (Bilateral) 10/03/2015  . Chronic cervical radicular pain (Right) 10/03/2015  . Chronic low back pain (Location of Primary Source of Pain) (Bilateral) (L>R) 09/06/2015  . Opiate use (60 MME/Day) 09/05/2015  . Long term prescription opiate use 09/05/2015  . Long term current use of opiate analgesic 09/05/2015  . Spondylosis of lumbar spine 09/05/2015  . Lumbar facet joint syndrome ([place]) 09/05/2015  . Chronic hip pain (Left) 09/05/2015  . Diabetic peripheral neuropathy ([place]) 09/05/2015  . Chronic lumbar radicular pain (S1 Dermatome) (Bilateral) (L>R) 09/05/2015  . Hypokalemia 09/05/2015  . Chronic shoulder pain (Location of Primary Source of Pain) (Bilateral) (L>R) 09/05/2015  . Diabetes ([place]) 09/05/2015  . Chronic foot pain (Location of Secondary source of pain) (Bilateral) (L>R) 09/05/2015  . Chronic hand pain (Location of Tertiary source of pain) (Bilateral) (R>L) 09/05/2015  . Lumbar facet hypertrophy 09/05/2015  . Encounter for therapeutic drug level monitoring 09/05/2015  . Encounter for pain management planning 09/05/2015  . Low testosterone 09/05/2015  . Occipital pain (Right) 09/05/2015  . 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 Hypertriglyceridemia 07/26/2015  . Chronic abdominal pain (RUQ) 07/14/2015  . Type 2 diabetes mellitus with peripheral neuropathy ([place]) 07/14/2015  . Chest pain 07/14/2015  .  Complete tear of the shoulder rotator cuff (Left) 05/10/2015  . Complete tear of left rotator cuff 05/10/2015  . SOB (shortness of breath) 03/22/2015  . Elevated  hemoglobin ([place]) 07/07/2013  . History of lumbar facet Synovial cyst, surgically removed. 05/29/2013    Class: History of  . 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 GERD (gastroesophageal reflux disease) 05/05/2013  . Benign prostatic hyperplasia 05/05/2013  . Hypertension 05/08/2011  . Hyperlipidemia 05/08/2011  . RA (rheumatoid arthritis) ([place]) 05/08/2011    Past Surgical History:  Procedure Laterality Date  . BACK SURGERY  2015   x3  . CHOLECYSTECTOMY  2000  . COLONOSCOPY WITH PROPOFOL N/A 04/11/2015   Procedure: COLONOSCOPY WITH PROPOFOL;  Surgeon: Manya Silvas, MD;  Location[facility] ENDOSCOPY;  Service: Endoscopy;  Laterality: N/A;  . ESOPHAGOGASTRODUODENOSCOPY (EGD) WITH PROPOFOL N/A 04/11/2015   Procedure: ESOPHAGOGASTRODUODENOSCOPY (EGD) WITH PROPOFOL;  Surgeon: Manya Silvas, MD;  Location: [facility] ENDOSCOPY;  Service: Endoscopy;  Laterality: N/A;  . SHOULDER SURGERY Right 2016  . SHOULDER SURGERY Left 06/2015    Prior to Admission medications   Medication Sig Start Date End Date Taking? Authorizing Provider  celecoxib (CELEBREX) 200 MG capsule Take 1 capsule (200 mg total) by mouth every 12 (twelve) hours. 08/07/16  Yes Costella, Vista Mink, PA-C  chlorthalidone (HYGROTON) 50 MG tablet Take 50 mg by mouth daily.  12/07/15  Yes [provider]  lisinopril (PRINIVIL,ZESTRIL) 20 MG tablet Take 1 tablet (20 mg total) by mouth daily. Reported on 12/08/2015 04/13/16  Yes Lada, Satira Anis, MD  Oxycodone HCl 10 MG TABS Take 1 tablet (10 mg total) by mouth every 6 (six) hours as needed. 01/28/17 02/27/17 Yes Vevelyn Francois, NP  ranitidine (ZANTAC) 75 MG tablet Take 75 mg by mouth daily.   Yes [provider]  rosuvastatin (CRESTOR) 40 MG tablet Take 1 tablet (40 mg total) by mouth at bedtime. 02/27/16  Yes Lada, Satira Anis, MD  sitaGLIPtin (JANUVIA) 50 MG tablet Take 1 tablet (50 mg total) by mouth daily. 04/13/16  Yes Lada, Satira Anis, MD  testosterone cypionate  (DEPOTESTOSTERONE CYPIONATE) 200 MG/ML injection Inject 200 mg into the muscle every 14 (fourteen) days. 05/12/12  Yes [provider]  amLODipine (NORVASC) 5 MG tablet Take 1 tablet (5 mg total) by mouth daily. Patient not taking: Reported on 02/18/2017 04/13/16   Arnetha Courser, MD  promethazine (PHENERGAN) 25 MG tablet Take 1 tablet (25 mg total) by mouth every 6 (six) hours as needed for nausea or vomiting. Patient not taking: Reported on 02/18/2017 08/08/16   Harvest Dark, MD    Allergies Patient has no known allergies.  Family History  Problem Relation Age of Onset  . Cancer Mother        Breast CA  . Stroke Father   . Heart disease Father   . Cancer Sister   . Cancer Maternal Grandmother   . Cancer Sister   . Bladder Cancer Neg Hx   . Prostate cancer Neg Hx   . Kidney cancer Neg Hx     Social History Social History  Substance Use Topics  . Smoking status: Never Smoker  . Smokeless tobacco: Former Systems developer    Types: Chew    Quit date: 01/18/2013  . Alcohol use No     Comment: occasional    Review of Systems  Constitutional: No fever/chills Eyes: No visual changes. ENT: No sore throat. Cardiovascular: Positive for chest pain. Respiratory: Positive forshortness of breath. Gastrointestinal: No  nausea, no vomiting.  Positive for diarrhea.  Genitourinary: Negative for dysuria.  Musculoskeletal: Positive for back pain. Skin: Negative for rash. Neurological: Negative for headaches, focal weakness or numbness.   ____________________________________________   PHYSICAL EXAM:  VITAL SIGNS: ED Triage Vitals [02/18/17 1628]  Enc Vitals Group     BP (!) 146/83     Pulse Rate 85     Resp 18     Temp 98.7 F (37.1 C)     Temp Source Oral     SpO2 100 %     Weight 195 lb (88.5 kg)     Height 5\' 7"  (1.702 m)     Head Circumference      Peak Flow      Pain Score 6     Pain Loc      Pain Edu?      Excl. in [place]?     Constitutional: Alert and oriented.  Well appearing and in no acute distress. Eyes: Conjunctivae are normal.  Head: Atraumatic. Nose: No congestion/rhinnorhea. Mouth/Throat: Mucous membranes are moist.   Neck: Normal range of motion.  Cardiovascular: Normal rate, regular rhythm. Grossly normal heart sounds.  Good peripheral circulation. Respiratory: Normal respiratory effort.  No retractions. Lungs CTAB. Gastrointestinal: Soft and nontender. No distention.  Genitourinary: No CVA tenderness. Musculoskeletal: No lower extremity edema. No calf tenderness. Extremities warm and well perfused. no midline spinal tenderness. Neurologic:  Normal speech and language. No gross focal neurologic deficits are appreciated.  Skin:  Skin is warm and dry. No rash noted. Psychiatric: Mood and affect are normal. Speech and behavior are normal.  ____________________________________________   LABS (all labs ordered are listed, but only abnormal results are displayed)  Labs Reviewed  BASIC METABOLIC PANEL - Abnormal; Notable for the following:       Result Value   Glucose, Bld 192 (*)    Calcium 8.8 (*)    All other components within normal limits  CBC  TROPONIN I   ____________________________________________  EKG  ED ECG REPORT I, Arta Silence, the attending physician, personally viewed and interpreted this ECG.  Date: 02/18/2017 EKG Time: 1626 Rate: 90 Rhythm: normal sinus rhythm QRS Axis: normal Intervals: normal ST/T Wave abnormalities: normal Narrative Interpretation: no evidence of acute ischemia  ____________________________________________  RADIOLOGY  Chest x-ray with no acute findings.  ____________________________________________   PROCEDURES  Procedure(s) performed: No    Critical Care performed: No ____________________________________________   INITIAL IMPRESSION / ASSESSMENT AND PLAN / ED COURSE  Pertinent labs & imaging results that were available during my care of the patient were  reviewed by me and considered in my medical decision making (see chart for details).  61 year old male with past history as noted presents with atypical and constant chest pain over the last 3 days, associated with mild shortness of breath. Pain is nonexertional and is not associated with cough or fever but pt does have diarrhea and some back pain. On exam, vital signs are normal, the patient is well-appearing, and exam is unremarkable. The EKG is normal. Overall low suspicion for ACS on this presentation given no prior CAD history. Patient's HEART score is 3. No indication for additional ACS workup emergently. Although cannot apply PERC due to age, given no hypoxia or tachycardia, no leg swelling or symptoms of DVT and no specific PE risk factors there is no evidence to suspect PE. Also given normal vital signs and patient's well appearance there is no evidence of aortic or other vascular etiology. Overall given  symptoms in multiple systems and association of shortness of breath I suspect most likely viral syndrome and/or element of bronchitis. Also consider musculoskeletal pain or GERD. Plan for trial of neb and reassess. Anticipate discharge home with primary care follow-up.   ----------------------------------------- 6:49 PM on 02/18/2017 -----------------------------------------  Pain and sob not improved after albuterol.  vital signs remain stable. Will give GI cocktail and reassess.   ----------------------------------------- 7:56 PM on 02/18/2017 -----------------------------------------  She reports no significant change in his pain, however he feels comfortable to go home. Patient's vital signs remain stable, pulse ox is normal, and there is no evidence of ACS or other concerning cause of his symptoms that would warrant further ED monitoring. Patient states he'll follow-up with his primary care doctor. I gave patient extensive return precautions including new or worsening chest pain,  difficulty breathing, fever or chills, or any other symptoms that concern him.  ____________________________________________   FINAL CLINICAL IMPRESSION(S) / ED DIAGNOSES  Final diagnoses:  Atypical chest pain      NEW MEDICATIONS STARTED DURING THIS VISIT:  New Prescriptions   No medications on file     Note:  This document was prepared using Dragon voice recognition software and may include unintentional dictation errors.    Arta Silence, MD 02/18/17 1958

## 2017-02-18 NOTE — ED Triage Notes (Signed)
Pt arrives to ER via POV c/o heaviness in chest  X3 days. Pt states that he just got tired of it today. Pt c/o trouble breathing when lying back. Pt alert and oriented X4, active, cooperative, pt in NAD. RR even and unlabored, color WNL.

## 2017-02-18 NOTE — Discharge Instructions (Signed)
Return to the ER immediately for new or worsening chest pain, difficulty breathing, weakness or lightheadedness, fever, difficulty swallowing, worsening abdominal pain, or any other new or worsening symptoms that concern you. Follow-up with your primary care doctor within the next week.

## 2017-02-25 ENCOUNTER — Encounter: Payer: Self-pay | Admitting: Pain Medicine

## 2017-02-25 ENCOUNTER — Ambulatory Visit: Payer: Medicare Other | Attending: Pain Medicine | Admitting: Pain Medicine

## 2017-02-25 ENCOUNTER — Telehealth: Payer: Self-pay | Admitting: *Deleted

## 2017-02-25 VITALS — BP 183/91 | HR 79 | Temp 98.1°F | Resp 16 | Ht 67.0 in | Wt 195.0 lb

## 2017-02-25 DIAGNOSIS — M545 Low back pain: Secondary | ICD-10-CM | POA: Insufficient documentation

## 2017-02-25 DIAGNOSIS — Z87891 Personal history of nicotine dependence: Secondary | ICD-10-CM | POA: Diagnosis not present

## 2017-02-25 DIAGNOSIS — Z87442 Personal history of urinary calculi: Secondary | ICD-10-CM | POA: Diagnosis not present

## 2017-02-25 DIAGNOSIS — M069 Rheumatoid arthritis, unspecified: Secondary | ICD-10-CM | POA: Diagnosis not present

## 2017-02-25 DIAGNOSIS — K219 Gastro-esophageal reflux disease without esophagitis: Secondary | ICD-10-CM | POA: Insufficient documentation

## 2017-02-25 DIAGNOSIS — Z9049 Acquired absence of other specified parts of digestive tract: Secondary | ICD-10-CM | POA: Diagnosis not present

## 2017-02-25 DIAGNOSIS — G8929 Other chronic pain: Secondary | ICD-10-CM | POA: Diagnosis present

## 2017-02-25 DIAGNOSIS — Z823 Family history of stroke: Secondary | ICD-10-CM | POA: Insufficient documentation

## 2017-02-25 DIAGNOSIS — M4696 Unspecified inflammatory spondylopathy, lumbar region: Secondary | ICD-10-CM | POA: Diagnosis not present

## 2017-02-25 DIAGNOSIS — M25512 Pain in left shoulder: Secondary | ICD-10-CM | POA: Diagnosis not present

## 2017-02-25 DIAGNOSIS — M48061 Spinal stenosis, lumbar region without neurogenic claudication: Secondary | ICD-10-CM | POA: Insufficient documentation

## 2017-02-25 DIAGNOSIS — Z809 Family history of malignant neoplasm, unspecified: Secondary | ICD-10-CM | POA: Insufficient documentation

## 2017-02-25 DIAGNOSIS — F1190 Opioid use, unspecified, uncomplicated: Secondary | ICD-10-CM | POA: Diagnosis not present

## 2017-02-25 DIAGNOSIS — M25511 Pain in right shoulder: Secondary | ICD-10-CM

## 2017-02-25 DIAGNOSIS — G5603 Carpal tunnel syndrome, bilateral upper limbs: Secondary | ICD-10-CM | POA: Diagnosis not present

## 2017-02-25 DIAGNOSIS — Z9889 Other specified postprocedural states: Secondary | ICD-10-CM | POA: Insufficient documentation

## 2017-02-25 DIAGNOSIS — Z79899 Other long term (current) drug therapy: Secondary | ICD-10-CM | POA: Insufficient documentation

## 2017-02-25 DIAGNOSIS — G894 Chronic pain syndrome: Secondary | ICD-10-CM | POA: Diagnosis not present

## 2017-02-25 DIAGNOSIS — E785 Hyperlipidemia, unspecified: Secondary | ICD-10-CM | POA: Insufficient documentation

## 2017-02-25 DIAGNOSIS — M47816 Spondylosis without myelopathy or radiculopathy, lumbar region: Secondary | ICD-10-CM

## 2017-02-25 DIAGNOSIS — M5416 Radiculopathy, lumbar region: Secondary | ICD-10-CM | POA: Diagnosis not present

## 2017-02-25 DIAGNOSIS — M79605 Pain in left leg: Secondary | ICD-10-CM | POA: Insufficient documentation

## 2017-02-25 DIAGNOSIS — E1142 Type 2 diabetes mellitus with diabetic polyneuropathy: Secondary | ICD-10-CM | POA: Diagnosis not present

## 2017-02-25 DIAGNOSIS — Z79891 Long term (current) use of opiate analgesic: Secondary | ICD-10-CM | POA: Diagnosis not present

## 2017-02-25 DIAGNOSIS — R0602 Shortness of breath: Secondary | ICD-10-CM | POA: Insufficient documentation

## 2017-02-25 DIAGNOSIS — I10 Essential (primary) hypertension: Secondary | ICD-10-CM | POA: Diagnosis not present

## 2017-02-25 DIAGNOSIS — M7138 Other bursal cyst, other site: Secondary | ICD-10-CM | POA: Insufficient documentation

## 2017-02-25 DIAGNOSIS — N400 Enlarged prostate without lower urinary tract symptoms: Secondary | ICD-10-CM | POA: Diagnosis not present

## 2017-02-25 DIAGNOSIS — M4316 Spondylolisthesis, lumbar region: Secondary | ICD-10-CM | POA: Insufficient documentation

## 2017-02-25 DIAGNOSIS — Z8249 Family history of ischemic heart disease and other diseases of the circulatory system: Secondary | ICD-10-CM | POA: Insufficient documentation

## 2017-02-25 DIAGNOSIS — M4310 Spondylolisthesis, site unspecified: Secondary | ICD-10-CM | POA: Insufficient documentation

## 2017-02-25 DIAGNOSIS — R1011 Right upper quadrant pain: Secondary | ICD-10-CM | POA: Diagnosis not present

## 2017-02-25 DIAGNOSIS — M47896 Other spondylosis, lumbar region: Secondary | ICD-10-CM | POA: Diagnosis not present

## 2017-02-25 MED ORDER — KETOROLAC TROMETHAMINE 60 MG/2ML IM SOLN
60.0000 mg | Freq: Once | INTRAMUSCULAR | Status: AC
  Administered 2017-02-25: 60 mg via INTRAMUSCULAR

## 2017-02-25 MED ORDER — ORPHENADRINE CITRATE 30 MG/ML IJ SOLN
60.0000 mg | Freq: Once | INTRAMUSCULAR | Status: AC
  Administered 2017-02-25: 60 mg via INTRAMUSCULAR

## 2017-02-25 MED ORDER — KETOROLAC TROMETHAMINE 60 MG/2ML IM SOLN
INTRAMUSCULAR | Status: AC
  Filled 2017-02-25: qty 2

## 2017-02-25 MED ORDER — OXYCODONE HCL 10 MG PO TABS: 10.0000 mg | ORAL_TABLET | Freq: Four times a day (QID) | ORAL | 0 refills | Status: DC | PRN

## 2017-02-25 MED ORDER — ORPHENADRINE CITRATE 30 MG/ML IJ SOLN
INTRAMUSCULAR | Status: AC
  Filled 2017-02-25: qty 2

## 2017-02-25 NOTE — Progress Notes (Signed)
Nursing Pain Medication Assessment:  Safety precautions to be maintained throughout the outpatient stay will include: orient to surroundings, keep bed in low position, maintain call bell within reach at all times, provide assistance with transfer out of bed and ambulation.  Medication Inspection Compliance: Pill count conducted under aseptic conditions, in front of the patient. Neither the pills nor the bottle was removed from the patient's sight at any time. Once count was completed pills were immediately returned to the patient in their original bottle.  Medication: See above Pill/Patch Count: 0 of 120 pills remain Pill/Patch Appearance: Markings consistent with prescribed medication Bottle Appearance: Standard pharmacy container. Clearly labeled. Filled Date: 08 / 20 / 2018 Last Medication intake:  Yesterday

## 2017-02-25 NOTE — Progress Notes (Signed)
Patient's Name: Howard Murphy  MRN: [id number]  Referring Provider: [facility]  DOB: May 15, 1956  PCP: Arnetha Courser, MD  DOS: 02/25/2017  Note by: Gaspar Cola, MD  Service setting: Ambulatory outpatient  Specialty: Interventional Pain Management  Location: [facility] (AMB) Pain Management Facility    Patient type: Established   Primary Reason(s) for Visit: Encounter for prescription drug management & post-procedure evaluation of chronic illness with mild to moderate exacerbation(Level of risk: moderate) CC: Back Pain (lower bilateral); Shoulder Pain (bilateral); and Leg Pain (left back )  HPI  Mr. Tierce is a 61 y.o. year old, male patient, who comes today for a post-procedure evaluation and medication management. He has Benign fibroma of prostate; Elevated hemoglobin (HCC); GERD (gastroesophageal reflux disease); Hypertension; Hyperlipidemia; History of lumbar facet Synovial cyst, surgically removed.; RA (rheumatoid arthritis) ([place]); SOB (shortness of breath); Chronic abdominal pain (RUQ); Type 2 diabetes mellitus with peripheral neuropathy ([place]); Chest pain; Hypertriglyceridemia; Opiate use (60 MME/Day); Long term prescription opiate use; Long term current use of opiate analgesic; Spondylosis of lumbar spine; Lumbar facet joint syndrome ([place]); Chronic hip pain (Left); Diabetic peripheral neuropathy ([place]); Chronic lumbar radicular pain (S1 Dermatome) (Bilateral) (L>R); Hypokalemia; Complete tear of the shoulder rotator cuff (Left); Chronic shoulder pain (Primary Source of Pain) (Bilateral) (L>R); Diabetes ([place]); Chronic foot pain (Secondary source of pain) (Bilateral) (L>R); Chronic hand pain [facility] source of pain) (Bilateral) (R>L); Lumbar facet hypertrophy; Encounter for therapeutic drug level monitoring; Low testosterone; Occipital pain (Right); Failed back surgical syndrome 4; Chronic low back pain (Primary Source of Pain) (Bilateral) (L>R); Radicular pain of shoulder (Right);  Carpal tunnel syndrome (Bilateral); Chronic cervical radicular pain (Right); Abnormal MRI, lumbar spine; Chronic sacroiliac joint pain (Bilateral) (L>R); Neurogenic pain; Cervical spondylosis; Cervical facet arthropathy; Chronic neck pain (Bilateral) (R>L); Cervical facet syndrome (Bilateral) (R>L); Chronic upper extremity pain (Bilateral) (R>L); Cervical foraminal stenosis;  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 His primarily concern today is the Back Pain (lower bilateral); Shoulder Pain (bilateral); and Leg Pain (left back )  Pain Assessment: Location: Lower, Left, Right (shoulders) Back Radiating: pain is going down the back of left leg  Onset: More than a month ago Duration: Chronic pain Quality: Numbness, Stabbing (numbness in the back of leg and stabbing pain in the back) Severity: 5 /10 (self-reported pain score)  Note: Reported level is compatible with observation.                   Effect on ADL: unable to walk a long distance.  difficult to maintain sitting or standing for long periods, interfering with sleep.  Timing: Constant Modifying factors: medications help a little bit  Mr. Fung was last seen on 01/14/2017 for a procedure. During today's appointment we reviewed Mr. Leiter's post-procedure results, as well as his outpatient medication regimen.  Further details on both, my assessment(s), as well as the proposed treatment plan, please see below.  Controlled Substance Pharmacotherapy Assessment REMS (Risk Evaluation and  Mitigation Strategy)  Analgesic: Oxycodone IR 10 mg 1 tablet by mouth 4 times a day (40 mg/dayof oxycodone) (60 MME/Day) MME/day:27m/day.  PJanett Billow  RN  02/25/2017  1:24 PM  Sign at close encounter Nursing Pain Medication Assessment:  Safety precautions to be maintained throughout the outpatient stay will include: orient to surroundings, keep bed in low position, maintain call bell within reach at all times, provide assistance with transfer out of bed and ambulation.  Medication Inspection Compliance: Pill count conducted under aseptic conditions, in front of the patient. Neither the pills nor the bottle was removed from the patient's sight at any time. Once count was completed pills were immediately returned to the patient in their original bottle.  Medication: See above Pill/Patch Count: 0 of 120 pills remain Pill/Patch Appearance: Markings consistent with prescribed medication Bottle Appearance: Standard pharmacy container. Clearly labeled. Filled Date: 08 / 20 / 2018 Last Medication intake:  Yesterday   Pharmacokinetics: Liberation and absorption (onset of action): WNL Distribution (time to peak effect): WNL Metabolism and excretion (duration of action): WNL         Pharmacodynamics: Desired effects: Analgesia: Mr. Slinger reports >50% benefit. Functional ability: Patient reports that medication allows him to accomplish basic ADLs Clinically meaningful improvement in function (CMIF): Sustained CMIF goals met Perceived effectiveness: Described as relatively effective, allowing for increase in activities of daily living (ADL) Undesirable effects: Side-effects or Adverse reactions: None reported Monitoring: [place] PMP: Online review of the past 75-monthperiod conducted. Compliant with practice rules and regulations List of all UDS test(s) done:  Lab Results  Component Value Date   TOXASSSELUR FINAL 11/21/2015   [place] 10[place]03[place]2015   SUMMARY FINAL 11/29/2016    SUMMARY FINAL 09/05/2015   Last UDS on record: [employer]ox[employer] Select 13  Date Value Ref Range Status  11/21/2015 FINAL  Final    Comment:    ==================================================================== TOXASSURE SELECT 13 (MW) ==================================================================== Test                             Result       Flag       Units Drug Present and Declared for Prescription Verification   Oxycodone                      912          EXPECTED   ng/mg creat   Oxymorphone                    863          EXPECTED   ng/mg creat   Noroxycodone                   1428         EXPECTED   ng/mg creat   Noroxymorphone                 302          EXPECTED   ng/mg creat    Sources of oxycodone are scheduled prescription medications.    Oxymorphone, noroxycodone, and noroxymorphone are expected    metabolites of oxycodone. Oxymorphone is also available as a    scheduled prescription medication. ==================================================================== Test                      Result    Flag   Units      Ref Range   Creatinine              218  mg/dL      >=20 ==================================================================== Declared Medications:  The flagging and interpretation on this report are based on the  following declared medications.  Unexpected results may arise from  inaccuracies in the declared medications.  **Note: The testing scope of this panel includes these medications:  Oxycodone  **Note: The testing scope of this panel does not include following  reported medications:  Amlodipine (Norvasc)  Chlorthalidone  Cyanocobalamin  Icosapent  Lisinopril  Naloxone  Omeprazole (Prilosec)  Ondansetron (Zofran)  Rosuvastatin (Crestor)  Sitagliptin (Januvia)  Sucralfate (Carafate)  Testosterone ==================================================================== For clinical consultation, please call [phone]. ====================================================================    Summary  Date Value Ref Range Status  11/29/2016 FINAL  Final    Comment:    ==================================================================== TOXASSURE SELECT 13 (MW) ==================================================================== Test                             Result       Flag       Units Drug Present   Oxycodone                      1718                    ng/mg creat   Oxymorphone                    1595                    ng/mg creat   Noroxycodone                   2663                    ng/mg creat   Noroxymorphone                 589                     ng/mg creat    Sources of oxycodone are scheduled prescription medications.    Oxymorphone, noroxycodone, and noroxymorphone are expected    metabolites of oxycodone. Oxymorphone is also available as a    scheduled prescription medication. ==================================================================== Test                      Result    Flag   Units      Ref Range   Creatinine              188              mg/dL      >=20 ==================================================================== Declared Medications:  Medication list was not provided. ==================================================================== For clinical consultation, please call [phone]. ====================================================================    UDS interpretation: Compliant          Medication Assessment Form: Reviewed. Patient indicates being compliant with therapy Treatment compliance: Compliant Risk Assessment Profile: Aberrant behavior: See prior evaluations. None observed or detected today Comorbid factors increasing risk of overdose: See prior notes. No additional risks detected today Risk of substance use disorder (SUD): Low     Opioid Risk Tool - 02/25/17 1319      Family History of Substance Abuse   Alcohol Negative    Illegal Drugs Negative   Rx Drugs Negative     Personal History of Substance Abuse   Alcohol Negative   Illegal Drugs Negative  Rx Drugs Negative     Psychological Disease   Psychological Disease Negative   Depression Negative     Total Score   Opioid Risk Tool Scoring 0   Opioid Risk Interpretation Low Risk     ORT Scoring interpretation table:  Score <3 = Low Risk for SUD  Score between 4-7 = Moderate Risk for SUD  Score >8 = High Risk for Opioid Abuse   Risk Mitigation Strategies:  Patient Counseling: Covered Patient-Prescriber Agreement (PPA): Present and active  Notification to other healthcare providers: Done  Pharmacologic Plan: No change in therapy, at this time  Post-Procedure Assessment  01/14/2017 Procedure: Diagnostic bilateral sacroiliac joint block into the area of the L5-S1 pseudoarthrosis.  Pre-procedure pain score:  5/10 Post-procedure pain score: 4/10         Influential Factors: BMI: 30.54 kg/m Intra-procedural challenges: None observed.         Assessment challenges: None detected.              Reported side-effects: None.        Post-procedural adverse reactions or complications: None reported         Sedation: Sedation provided. When no sedatives are used, the analgesic levels obtained are directly associated to the effectiveness of the local anesthetics. However, when sedation is provided, the level of analgesia obtained during the initial 1 hour following the intervention, is believed to be the result of a combination of factors. These factors may include, but are not limited to: 1. The effectiveness of the local anesthetics used. 2. The effects of the analgesic(s) and/or anxiolytic(s) used. 3. The degree of discomfort experienced by the patient at the time of the procedure. 4. The patients ability and reliability in recalling and recording the events. 5. The presence and influence of possible secondary gains and/or psychosocial  factors. Reported result: Relief experienced during the 1st hour after the procedure: 0 % (Ultra-Short Term Relief)            Interpretative annotation: Clinically possible results. No relief from the local anesthetics would suggest pain etiology to reside elsewhere.          Effects of local anesthetic: The analgesic effects attained during this period are directly associated to the localized infiltration of local anesthetics and therefore cary significant diagnostic value as to the etiological location, or anatomical origin, of the pain. Expected duration of relief is directly dependent on the pharmacodynamics of the local anesthetic used. Long-acting (4-6 hours) anesthetics used.  Reported result: Relief during the next 4 to 6 hour after the procedure: 0 % (Short-Term Relief)            Interpretative annotation: Clinically appropriate result. No analgesic effect would suggest pain etiology to reside elsewhere.          Long-term benefit: Defined as the period of time past the expected duration of local anesthetics (1 hour for short-acting and 4-6 hours for long-acting). With the possible exception of prolonged sympathetic blockade from the local anesthetics, benefits during this period are typically attributed to, or associated with, other factors such as analgesic sensory neuropraxia, antiinflammatory effects, or beneficial biochemical changes provided by agents other than the local anesthetics.  Reported result: Extended relief following procedure: 0 % (Long-Term Relief)            Interpretative annotation: Clinically possible results. No benefit. No long-term benefit attained.             Current benefits: Defined as persistent relief  that continues at this point in time.   Reported results: Treated area: 0 %       Interpretative annotation: No benefit. Therapeutic failure. Effective diagnostic intervention.          Interpretation: Results would suggest a successful diagnostic intervention.                   Plan:  Re-assessment of etiological location of pain.  Laboratory Chemistry  Inflammation Markers (CRP: Acute Phase) (ESR: Chronic Phase) Lab Results  Component Value Date   CRP 1.8 11/29/2016   ESRSEDRATE 11 11/29/2016                 Renal Function Markers Lab Results  Component Value Date   BUN 9 02/18/2017   CREATININE 0.91 02/18/2017   GFRAA >60 02/18/2017   GFRNONAA >60 02/18/2017                 Hepatic Function Markers Lab Results  Component Value Date   AST 34 12/01/2016   ALT 27 12/01/2016   ALBUMIN 4.2 12/01/2016   ALKPHOS 56 12/01/2016                 Electrolytes Lab Results  Component Value Date   NA 136 02/18/2017   K 3.5 02/18/2017   CL 104 02/18/2017   CALCIUM 8.8 (L) 02/18/2017   MG 2.0 11/29/2016                 Neuropathy Markers Lab Results  Component Value Date   JIRCVELF81 017 11/29/2016                 Bone Pathology Markers Lab Results  Component Value Date   ALKPHOS 56 12/01/2016   25OHVITD1 24 (L) 11/29/2016   25OHVITD2 <1.0 11/29/2016   25OHVITD3 24 11/29/2016   CALCIUM 8.8 (L) 02/18/2017                 Coagulation Parameters Lab Results  Component Value Date   PLT 202 02/18/2017                 Cardiovascular Markers Lab Results  Component Value Date   HGB 16.6 02/18/2017   HCT 49.1 02/18/2017                 Note: Lab results reviewed.  Recent Diagnostic Imaging Review  Dg Chest 2 View Result Date: 02/18/2017 CLINICAL DATA:  Chest heaviness. EXAM: CHEST  2 VIEW COMPARISON:  July 15, 2015. FINDINGS: The cardiomediastinal silhouette is normal in size. Normal pulmonary vascularity. Stable elevation of the right hemidiaphragm and right basilar atelectasis. No focal consolidation, pleural effusion, or pneumothorax. No acute osseous abnormality. IMPRESSION: No active cardiopulmonary disease. Electronically Signed   By: Titus Dubin M.D.   On: 02/18/2017 17:23   Note: Imaging results reviewed.           Meds   Current Outpatient Prescriptions:  .  celecoxib (CELEBREX) 200 MG capsule, Take 1 capsule (200 mg total) by mouth every 12 (twelve) hours., Disp: 60 capsule, Rfl: 1 .  chlorthalidone (HYGROTON) 50 MG tablet, Take 50 mg by mouth daily. , Disp: , Rfl:  .  lisinopril (PRINIVIL,ZESTRIL) 20 MG tablet, Take 1 tablet (20 mg total) by mouth daily. Reported on 12/08/2015, Disp: 30 tablet, Rfl: 1 .  Oxycodone HCl 10 MG TABS, Take 1 tablet (10 mg total) by mouth every 6 (six) hours as needed., Disp: 120 tablet, Rfl: 0 .  promethazine (PHENERGAN) 25 MG tablet, Take 1 tablet (25 mg total) by mouth every 6 (six) hours as needed for nausea or vomiting., Disp: 20 tablet, Rfl: 0 .  ranitidine (ZANTAC) 75 MG tablet, Take 75 mg by mouth daily., Disp: , Rfl:  .  rosuvastatin (CRESTOR) 40 MG tablet, Take 1 tablet (40 mg total) by mouth at bedtime., Disp: 30 tablet, Rfl: 1 .  sitaGLIPtin (JANUVIA) 50 MG tablet, Take 1 tablet (50 mg total) by mouth daily., Disp: 30 tablet, Rfl: 1 .  testosterone cypionate (DEPOTESTOSTERONE CYPIONATE) 200 MG/ML injection, Inject 200 mg into the muscle every 14 (fourteen) days., Disp: , Rfl:   ROS  Constitutional: Denies any fever or chills Gastrointestinal: No reported hemesis, hematochezia, vomiting, or acute GI distress Musculoskeletal: Denies any acute onset joint swelling, redness, loss of ROM, or weakness Neurological: No reported episodes of acute onset apraxia, aphasia, dysarthria, agnosia, amnesia, paralysis, loss of coordination, or loss of consciousness  Allergies  Mr. Hargens has No Known Allergies.  [place]  Drug: Mr. Delaughter  reports that he does not use drugs. Alcohol:  reports that he does not drink alcohol. Tobacco:  reports that he has never smoked. He quit smokeless tobacco use about 4 years ago. His smokeless tobacco use included Chew. Medical:  has a past medical history of Arthritis; Back ache (05/05/2013); Diabetes mellitus without complication  ([place]); GERD (gastroesophageal reflux disease); Headache; Hyperlipemia; Hypertension; Kidney stones; Nephrolithiasis; and Shortness of breath dyspnea. Surgical: Mr. Bickford  has a past surgical history that includes Cholecystectomy (2000); Shoulder surgery (Right, 2016); Colonoscopy with propofol (N/A, 04/11/2015); Esophagogastroduodenoscopy (egd) with propofol (N/A, 04/11/2015); Shoulder surgery (Left, 06/2015); and Back surgery (2015). Family: family history includes Cancer in his maternal grandmother, mother, sister, and sister; Heart disease in his father; Stroke in his father.  Constitutional Exam  General appearance: Well nourished, well developed, and well hydrated. In no apparent acute distress Vitals:   02/25/17 1313  BP: (!) 183/91  Pulse: 79  Resp: 16  Temp: 98.1 F (36.7 C)  TempSrc: Oral  SpO2: 97%  Weight: 195 lb (88.5 kg)  Height: '5\' 7"'  (1.702 m)   BMI Assessment: Estimated body mass index is 30.54 kg/m as calculated from the following:   Height as of this encounter: '5\' 7"'  (1.702 m).   Weight as of this encounter: 195 lb (88.5 kg).  BMI interpretation table: BMI level Category Range association with higher incidence of chronic pain  <18 kg/m2 Underweight   18.5-24.9 kg/m2 Ideal body weight   25-29.9 kg/m2 Overweight Increased incidence by 20%  30-34.9 kg/m2 Obese (Class I) Increased incidence by 68%  35-39.9 kg/m2 Severe obesity (Class II) Increased incidence by 136%  >40 kg/m2 Extreme obesity (Class III) Increased incidence by 254%   BMI Readings from Last 4 Encounters:  02/25/17 30.54 kg/m  02/18/17 30.54 kg/m  01/28/17 29.76 kg/m  12/24/16 28.98 kg/m   Wt Readings from Last 4 Encounters:  02/25/17 195 lb (88.5 kg)  02/18/17 195 lb (88.5 kg)  01/28/17 190 lb (86.2 kg)  12/24/16 185 lb (83.9 kg)  Psych/Mental status: Alert, oriented x 3 (person, place, & time)       Eyes: PERLA Respiratory: No evidence of acute respiratory distress  Cervical Spine  Area Exam  Skin & Axial Inspection: No masses, redness, edema, swelling, or associated skin lesions Alignment: Symmetrical Functional ROM: Unrestricted ROM      Stability: No instability detected Muscle Tone/Strength: Functionally intact. No obvious neuro-muscular anomalies detected. Sensory (Neurological): Unimpaired  Palpation: No palpable anomalies              Upper Extremity (UE) Exam    Side: Right upper extremity  Side: Left upper extremity  Skin & Extremity Inspection: Skin color, temperature, and hair growth are WNL. No peripheral edema or cyanosis. No masses, redness, swelling, asymmetry, or associated skin lesions. No contractures.  Skin & Extremity Inspection: Skin color, temperature, and hair growth are WNL. No peripheral edema or cyanosis. No masses, redness, swelling, asymmetry, or associated skin lesions. No contractures.  Functional ROM: Unrestricted ROM          Functional ROM: Unrestricted ROM          Muscle Tone/Strength: Functionally intact. No obvious neuro-muscular anomalies detected.  Muscle Tone/Strength: Functionally intact. No obvious neuro-muscular anomalies detected.  Sensory (Neurological): Unimpaired          Sensory (Neurological): Unimpaired          Palpation: No palpable anomalies              Palpation: No palpable anomalies              Specialized Test(s): Deferred         Specialized Test(s): Deferred          Thoracic Spine Area Exam  Skin & Axial Inspection: No masses, redness, or swelling Alignment: Symmetrical Functional ROM: Unrestricted ROM Stability: No instability detected Muscle Tone/Strength: Functionally intact. No obvious neuro-muscular anomalies detected. Sensory (Neurological): Unimpaired Muscle strength & Tone: No palpable anomalies  Lumbar Spine Area Exam  Skin & Axial Inspection: No masses, redness, or swelling Alignment: Symmetrical Functional ROM: Decreased ROM      Stability: No instability detected Muscle Tone/Strength:  Functionally intact. No obvious neuro-muscular anomalies detected. Sensory (Neurological): Movement-associated pain Palpation: Complains of area being tender to palpation       Provocative Tests: Lumbar Hyperextension and rotation test: Positive bilaterally for facet joint pain. Lumbar Lateral bending test: evaluation deferred today       Patrick's Maneuver: evaluation deferred today                    Gait & Posture Assessment  Ambulation: Unassisted Gait: Relatively normal for age and body habitus Posture: WNL   Lower Extremity Exam    Side: Right lower extremity  Side: Left lower extremity  Skin & Extremity Inspection: Skin color, temperature, and hair growth are WNL. No peripheral edema or cyanosis. No masses, redness, swelling, asymmetry, or associated skin lesions. No contractures.  Skin & Extremity Inspection: Skin color, temperature, and hair growth are WNL. No peripheral edema or cyanosis. No masses, redness, swelling, asymmetry, or associated skin lesions. No contractures.  Functional ROM: Unrestricted ROM          Functional ROM: Unrestricted ROM          Muscle Tone/Strength: Functionally intact. No obvious neuro-muscular anomalies detected.  Muscle Tone/Strength: Functionally intact. No obvious neuro-muscular anomalies detected.  Sensory (Neurological): Unimpaired  Sensory (Neurological): Unimpaired  Palpation: No palpable anomalies  Palpation: No palpable anomalies   Assessment  Primary Diagnosis & Pertinent Problem List: The primary encounter diagnosis was Chronic low back pain (Primary Source of Pain) (Bilateral) (L>R). Diagnoses of Lumbar facet joint syndrome (HCC), Lumbar facet (L4-5) synovial cyst (6 mm) (Left), Lumbar facet hypertrophy, Grade 1 Anterolisthesis (2 mm) of L4 over L5., Spinal stenosis of lumbar region without neurogenic claudication, Chronic lumbar radicular pain (S1 Dermatome) (Bilateral) (L>R), Chronic shoulder pain (Primary Source of  Pain) (Bilateral)  (L>R), Diabetic peripheral neuropathy (HCC), Chronic pain syndrome, Long term current use of opiate analgesic, Long term prescription opiate use, Opiate use (60 MME/Day), and History of lumbar facet Synovial cyst, surgically removed. were also pertinent to this visit.  Status Diagnosis  Unimproved Persistent Stable 1. Chronic low back pain (Primary Source of Pain) (Bilateral) (L>R)   2. Lumbar facet joint syndrome ([place])   3. Lumbar facet (L4-5) synovial cyst (6 mm) (Left)   4. Lumbar facet hypertrophy   5. Grade 1 Anterolisthesis (2 mm) of L4 over L5.   6. Spinal stenosis of lumbar region without neurogenic claudication   7. Chronic lumbar radicular pain (S1 Dermatome) (Bilateral) (L>R)   8. Chronic shoulder pain (Primary Source of Pain) (Bilateral) (L>R)   9. Diabetic peripheral neuropathy ([place])   10. Chronic pain syndrome   11. Long term current use of opiate analgesic   12. Long term prescription opiate use   13. Opiate use (60 MME/Day)   14. History of lumbar facet Synovial cyst, surgically removed.     Problems updated and reviewed during this visit: Problem  Low Testosterone  Sob (Shortness of Breath)  Gerd (Gastroesophageal Reflux Disease)   Plan of Care  Pharmacotherapy (Medications Ordered): Meds ordered this encounter  Medications  . Oxycodone HCl 10 MG TABS    Sig: Take 1 tablet (10 mg total) by mouth every 6 (six) hours as needed.    Dispense:  120 tablet    Refill:  0    Do not place this medication, or any other prescription from our practice, on "Automatic Refill". Patient may have prescription filled one day early if pharmacy is closed on scheduled refill date. Do not fill until: 02/25/2017 To last until: 03/27/2017  . orphenadrine (NORFLEX) injection 60 mg  . ketorolac (TORADOL) injection 60 mg   New Prescriptions   No medications on file   Medications administered today: We administered orphenadrine and ketorolac.   Procedure Orders      Radiofrequency,Lumbar     Radiofrequency,Lumbar     SHOULDER INJECTION   Lab Orders  No laboratory test(s) ordered today   Imaging Orders  No imaging studies ordered today   Referral Orders  No referral(s) requested today    Interventional management options: Planned, scheduled, and/or pending:   Diagnostic bilateral intra-articular shoulder joint injection under fluoroscopic guidance, no sedation, while we wait for approval on his left lumbar facet RFA.    Considering:   Diagnostic bilateral cervical facet block Possible bilateral cervical facet radiofrequency ablation  Diagnostic right-sided greater occipital nerve block Possible right-sided greater occipital nerve radiofrequencyablation  Possible right-sided peripheral nerve stimulator trial  Diagnostic right-sided cervical epidural steroid injection  Diagnostic bilateral intra-articular shoulder injection Diagnostic bilateral suprascapular nerve block Possible bilateral suprascapular nerve radiofrequencyablation  Diagnostic bilateral sacroiliac joint block Possible bilateral sacroiliac joint radiofrequencyablation  Diagnostic bilateral lumbar facet block Possible bilateral lumbar facet radiofrequencyablation  Diagnostic left L4-5 transforaminal epiduralsteroid injection  Diagnostic left-sided L4-5 lumbar epidural steroid injection  Diagnostic left intra-articular hip joint injection Possible left hip joint radiofrequencyablation  Diagnostic bilateral median nerve block(carpal tunnel block)  Diagnostic right-sided celiac plexus block   Palliative PRN treatment(s):   None at this time   Provider-requested follow-up: Return for Procedure (no sedation): (B) IA-Shoulder injection. In addition, RFA procedure under fluoro and sedation.  Future Appointments Date Time Provider [place]  02/28/2017 1:45 PM Milinda Pointer, MD [facility] None  03/07/2017 1:20 PM Lada, Satira Anis, MD [place] None  03/28/2017 2:15 PM Milinda Pointer, MD [facility] None   Primary Care Physician: Arnetha Courser, MD Location: [facility] Note by: Gaspar Cola, MD Date: 02/25/2017; Time: 2:03 PM

## 2017-02-25 NOTE — Patient Instructions (Addendum)
____________________________________________________________________________________________  Preparing for your procedure (without sedation) Instructions: . Oral Intake: Do not eat or drink anything for at least 3 hours prior to your procedure. . Transportation: Unless otherwise stated by your physician, you may drive yourself after the procedure. . Blood Pressure Medicine: Take your blood pressure medicine with a sip of water the morning of the procedure. . Blood thinners:  . Diabetics on insulin: Notify the staff so that you can be scheduled 1st case in the morning. If your diabetes requires high dose insulin, take only  of your normal insulin dose the morning of the procedure and notify the staff that you have done so. . Preventing infections: Shower with an antibacterial soap the morning of your procedure.  . Build-up your immune system: Take 1000 mg of Vitamin C with every meal (3 times a day) the day prior to your procedure. Marland Kitchen Antibiotics: Inform the staff if you have a condition or reason that requires you to take antibiotics before dental procedures. . Pregnancy: If you are pregnant, call and cancel the procedure. . Sickness: If you have a cold, fever, or any active infections, call and cancel the procedure. . Arrival: You must be in the facility at least 30 minutes prior to your scheduled procedure. . Children: Do not bring any children with you. . Dress appropriately: Bring dark clothing that you would not mind if they get stained. . Valuables: Do not bring any jewelry or valuables. Procedure appointments are reserved for interventional treatments only. Marland Kitchen No Prescription Refills. . No medication changes will be discussed during procedure appointments. . No disability issues will be discussed. ____________________________________________________________________________________________  Radiofrequency Lesioning Radiofrequency lesioning is a procedure that is performed to relieve  pain. The procedure is often used for back, neck, or arm pain. Radiofrequency lesioning involves the use of a machine that creates radio waves to make heat. During the procedure, the heat is applied to the nerve that carries the pain signal. The heat damages the nerve and interferes with the pain signal. Pain relief usually starts about 2 weeks after the procedure and lasts for 6 months to 1 year. Tell a health care provider about:  Any allergies you have.  All medicines you are taking, including vitamins, herbs, eye drops, creams, and over-the-counter medicines.  Any problems you or family members have had with anesthetic medicines.  Any blood disorders you have.  Any surgeries you have had.  Any medical conditions you have.  Whether you are pregnant or may be pregnant. What are the risks? Generally, this is a safe procedure. However, problems may occur, including:  Pain or soreness at the injection site.  Infection at the injection site.  Damage to nerves or blood vessels.  What happens before the procedure?  Ask your health care provider about: ? Changing or stopping your regular medicines. This is especially important if you are taking diabetes medicines or blood thinners. ? Taking medicines such as aspirin and ibuprofen. These medicines can thin your blood. Do not take these medicines before your procedure if your health care provider instructs you not to.  Follow instructions from your health care provider about eating or drinking restrictions.  Plan to have someone take you home after the procedure.  If you go home right after the procedure, plan to have someone with you for 24 hours. What happens during the procedure?  You will be given one or more of the following: ? A medicine to help you relax (sedative). ? A medicine to numb  the area (local anesthetic).  You will be awake during the procedure. You will need to be able to talk with the health care provider during  the procedure.  With the help of a type of X-ray (fluoroscopy), the health care provider will insert a radiofrequency needle into the area to be treated.  Next, a wire that carries the radio waves (electrode) will be put through the radiofrequency needle. An electrical pulse will be sent through the electrode to verify the correct nerve. You will feel a tingling sensation, and you may have muscle twitching.  Then, the tissue that is around the needle tip will be heated by an electric current that is passed using the radiofrequency machine. This will numb the nerves.  A bandage (dressing) will be put on the insertion area after the procedure is done. The procedure may vary among health care providers and hospitals. What happens after the procedure?  Your blood pressure, heart rate, breathing rate, and blood oxygen level will be monitored often until the medicines you were given have worn off.  Return to your normal activities as directed by your health care provider. This information is not intended to replace advice given to you by your health care provider. Make sure you discuss any questions you have with your health care provider. Document Released: 01/24/2011 Document Revised: 11/03/2015 Document Reviewed: 07/05/2014 Elsevier Interactive Patient Education  2018 [employer]  What are the risk, side effects and possible complications? Generally speaking, most procedures are safe.  However, with any procedure there are risks, side effects, and the possibility of complications.  The risks and complications are dependent upon the sites that are lesioned, or the type of nerve block to be performed.  The closer the procedure is to the spine, the more serious the risks are.  Great care is taken when placing the radio frequency needles, block needles or lesioning probes, but sometimes complications can occur. 1. Infection: Any time there is an injection through the  skin, there is a risk of infection.  This is why sterile conditions are used for these blocks.  There are four possible types of infection. 1. Localized skin infection. 2. Central Nervous System Infection-This can be in the form of Meningitis, which can be deadly. 3. Epidural Infections-This can be in the form of an epidural abscess, which can cause pressure inside of the spine, causing compression of the spinal cord with subsequent paralysis. This would require an emergency surgery to decompress, and there are no guarantees that the patient would recover from the paralysis. 4. Discitis-This is an infection of the intervertebral discs.  It occurs in about 1% of discography procedures.  It is difficult to treat and it may lead to surgery.        2. Pain: the needles have to go through skin and soft tissues, will cause soreness.       3. Damage to internal structures:  The nerves to be lesioned may be near blood vessels or    other nerves which can be potentially damaged.       4. Bleeding: Bleeding is more common if the patient is taking blood thinners such as  aspirin, Coumadin, Ticiid, Plavix, etc., or if he/she have some genetic predisposition  such as hemophilia. Bleeding into the spinal canal can cause compression of the spinal  cord with subsequent paralysis.  This would require an emergency surgery to  decompress and there are no guarantees that the patient would recover  from the  paralysis.       5. Pneumothorax:  Puncturing of a lung is a possibility, every time a needle is introduced in  the area of the chest or upper back.  Pneumothorax refers to free air around the  collapsed lung(s), inside of the thoracic cavity (chest cavity).  Another two possible  complications related to a similar event would include: Hemothorax and Chylothorax.   These are variations of the Pneumothorax, where instead of air around the collapsed  lung(s), you may have blood or chyle, respectively.       6. Spinal  headaches: They may occur with any procedures in the area of the spine.       7. Persistent CSF (Cerebro-Spinal Fluid) leakage: This is a rare problem, but may occur  with prolonged intrathecal or epidural catheters either due to the formation of a fistulous  track or a dural tear.       8. Nerve damage: By working so close to the spinal cord, there is always a possibility of  nerve damage, which could be as serious as a permanent spinal cord injury with  paralysis.       9. Death:  Although rare, severe deadly allergic reactions known as "Anaphylactic  reaction" can occur to any of the medications used.      10. Worsening of the symptoms:  We can always make thing worse.  What are the chances of something like this happening? Chances of any of this occuring are extremely low.  By statistics, you have more of a chance of getting killed in a motor vehicle accident: while driving to the hospital than any of the above occurring .  Nevertheless, you should be aware that they are possibilities.  In general, it is similar to taking a shower.  Everybody knows that you can slip, hit your head and get killed.  Does that mean that you should not shower again?  Nevertheless always keep in mind that statistics do not mean anything if you happen to be on the wrong side of them.  Even if a procedure has a 1 (one) in a 1,000,000 (million) chance of going wrong, it you happen to be that one..Also, keep in mind that by statistics, you have more of a chance of having something go wrong when taking medications.  Who should not have this procedure? If you are on a blood thinning medication (e.g. Coumadin, Plavix, see list of "Blood Thinners"), or if you have an active infection going on, you should not have the procedure.  If you are taking any blood thinners, please inform your physician.  How should I prepare for this procedure?  Do not eat or drink anything at least six hours prior to the procedure.  Bring a driver  with you .  It cannot be a taxi.  Come accompanied by an adult that can drive you back, and that is strong enough to help you if your legs get weak or numb from the local anesthetic.  Take all of your medicines the morning of the procedure with just enough water to swallow them.  If you have diabetes, make sure that you are scheduled to have your procedure done first thing in the morning, whenever possible.  If you have diabetes, take only half of your insulin dose and notify our nurse that you have done so as soon as you arrive at the clinic.  If you are diabetic, but only take blood sugar pills (oral hypoglycemic),  then do not take them on the morning of your procedure.  You may take them after you have had the procedure.  Do not take aspirin or any aspirin-containing medications, at least eleven (11) days prior to the procedure.  They may prolong bleeding.  Wear loose fitting clothing that may be easy to take off and that you would not mind if it got stained with Betadine or blood.  Do not wear any jewelry or perfume  Remove any nail coloring.  It will interfere with some of our monitoring equipment.  NOTE: Remember that this is not meant to be interpreted as a complete list of all possible complications.  Unforeseen problems may occur.  BLOOD THINNERS The following drugs contain aspirin or other products, which can cause increased bleeding during surgery and should not be taken for 2 weeks prior to and 1 week after surgery.  If you should need take something for relief of minor pain, you may take acetaminophen which is found in Tylenol,m Datril, Anacin-3 and Panadol. It is not blood thinner. The products listed below are.  Do not take any of the products listed below in addition to any listed on your instruction sheet.  A.P.C or A.P.C with Codeine Codeine Phosphate Capsules #3 Ibuprofen Ridaura  ABC compound Congesprin Imuran rimadil  Advil Cope Indocin Robaxisal  Alka-Seltzer  Effervescent Pain Reliever and Antacid Coricidin or Coricidin-D  Indomethacin Rufen  Alka-Seltzer plus Cold Medicine Cosprin Ketoprofen S-A-C Tablets  Anacin Analgesic Tablets or Capsules Coumadin Korlgesic Salflex  Anacin Extra Strength Analgesic tablets or capsules CP-2 Tablets Lanoril Salicylate  Anaprox Cuprimine Capsules Levenox Salocol  Anexsia-D Dalteparin Magan Salsalate  Anodynos Darvon compound Magnesium Salicylate Sine-off  Ansaid Dasin Capsules Magsal Sodium Salicylate  Anturane Depen Capsules Marnal Soma  APF Arthritis pain formula Dewitt's Pills Measurin Stanback  Argesic Dia-Gesic Meclofenamic Sulfinpyrazone  Arthritis Bayer Timed Release Aspirin Diclofenac Meclomen Sulindac  Arthritis pain formula Anacin Dicumarol Medipren Supac  Analgesic (Safety coated) Arthralgen Diffunasal Mefanamic Suprofen  Arthritis Strength Bufferin Dihydrocodeine Mepro Compound Suprol  Arthropan liquid Dopirydamole Methcarbomol with Aspirin Synalgos  ASA tablets/Enseals Disalcid Micrainin Tagament  Ascriptin Doan's Midol Talwin  Ascriptin A/D Dolene Mobidin Tanderil  Ascriptin Extra Strength Dolobid Moblgesic Ticlid  Ascriptin with Codeine Doloprin or Doloprin with Codeine Momentum Tolectin  Asperbuf Duoprin Mono-gesic Trendar  Aspergum Duradyne Motrin or Motrin IB Triminicin  Aspirin plain, buffered or enteric coated Durasal Myochrisine Trigesic  Aspirin Suppositories Easprin Nalfon Trillsate  Aspirin with Codeine Ecotrin Regular or Extra Strength Naprosyn Uracel  Atromid-S Efficin Naproxen Ursinus  Auranofin Capsules Elmiron Neocylate Vanquish  Axotal Emagrin Norgesic Verin  Azathioprine Empirin or Empirin with Codeine Normiflo Vitamin E  Azolid Emprazil Nuprin Voltaren  Bayer Aspirin plain, buffered or children's or timed BC Tablets or powders Encaprin Orgaran Warfarin Sodium  Buff-a-Comp Enoxaparin Orudis Zorpin  Buff-a-Comp with Codeine Equegesic Os-Cal-Gesic   Buffaprin Excedrin  plain, buffered or Extra Strength Oxalid   Bufferin Arthritis Strength Feldene Oxphenbutazone   Bufferin plain or Extra Strength Feldene Capsules Oxycodone with Aspirin   Bufferin with Codeine Fenoprofen Fenoprofen Pabalate or Pabalate-SF   Buffets II Flogesic Panagesic   Buffinol plain or Extra Strength Florinal or Florinal with Codeine Panwarfarin   Buf-Tabs Flurbiprofen Penicillamine   Butalbital Compound Four-way cold tablets Penicillin   Butazolidin Fragmin Pepto-Bismol   Carbenicillin Geminisyn Percodan   Carna Arthritis Reliever Geopen Persantine   Carprofen Gold's salt Persistin   Chloramphenicol Goody's Phenylbutazone   Chloromycetin Haltrain Piroxlcam   Clmetidine  heparin Plaquenil   Cllnoril Hyco-pap Ponstel   Clofibrate Hydroxy chloroquine Propoxyphen         Before stopping any of these medications, be sure to consult the physician who ordered them.  Some, such as Coumadin (Warfarin) are ordered to prevent or treat serious conditions such as "deep thrombosis", "pumonary embolisms", and other heart problems.  The amount of time that you may need off of the medication may also vary with the medication and the reason for which you were taking it.  If you are taking any of these medications, please make sure you notify your pain physician before you undergo any procedures.

## 2017-02-28 ENCOUNTER — Ambulatory Visit: Payer: Medicare Other | Admitting: Pain Medicine

## 2017-02-28 DIAGNOSIS — M19012 Primary osteoarthritis, left shoulder: Secondary | ICD-10-CM

## 2017-02-28 DIAGNOSIS — M19011 Primary osteoarthritis, right shoulder: Secondary | ICD-10-CM | POA: Insufficient documentation

## 2017-02-28 NOTE — Progress Notes (Deleted)
Patient's Name: Howard Murphy  MRN: [id number]  Referring Provider: Arnetha Courser, MD  DOB: [birth year]  PCP: Arnetha Courser, MD  DOS: 02/28/2017  Note by: Gaspar Cola, MD  Service setting: Ambulatory outpatient  Specialty: Interventional Pain Management  Patient type: Established  Location: [facility] (AMB) Pain Management Facility  Visit type: Interventional Procedure   Primary Reason for Visit: Interventional Pain Management Treatment. CC: No chief complaint on file.  Procedure:  Anesthesia, Analgesia, Anxiolysis:  Type: Therapeutic Glonohumeral Joint (shoulder) Injection Region: Superior Shoulder Area Level: Shoulder Laterality: Bilateral  Type: Local Anesthesia with Moderate (Conscious) Sedation Local Anesthetic: Lidocaine 1% Route: Intravenous (IV) IV Access: Secured Sedation: Meaningful verbal contact was maintained at all times during the procedure  Indication(s): Analgesia and Anxiety  Indications: 1. Chronic shoulder pain (Primary Source of Pain) (Bilateral) (L>R)   2. 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 (year old), male patient, seen today for interventional treatment. He  has a past surgical history that includes Cholecystectomy (2000); Shoulder surgery (Right, 2016); Colonoscopy with propofol (N/A, 04/11/2015); Esophagogastroduodenoscopy (egd) with propofol (N/A, 04/11/2015); Shoulder surgery (Left, 06/2015); and Back surgery (2015). Howard Murphy has a current medication list which includes the following prescription(s): celecoxib, chlorthalidone, lisinopril, oxycodone hcl, promethazine, ranitidine, rosuvastatin, sitagliptin, and testosterone cypionate. His primarily concern today is the No chief complaint on file.  Initial Vital Signs: There were no vitals taken for this visit. BMI: Estimated body mass index is 30.54 kg/m as calculated from the following:    Height as of 02/25/17: 5\' 7"  (1.702 m).   Weight as of 02/25/17: 195 lb (88.5 kg).  Risk Assessment: Allergies: Reviewed. He has No Known Allergies.  Allergy Precautions: None required Coagulopathies: Reviewed. None identified.  Blood-thinner therapy: None at this time Active Infection(s): Reviewed. None identified. [name] is afebrile  Site Confirmation: Howard Murphy was asked to confirm the procedure and laterality before marking the site Procedure checklist: Completed Consent: Before the procedure and under the influence of no sedative(s), amnesic(s), or anxiolytics, the patient was informed of the treatment options, risks and possible complications. To fulfill our ethical and legal obligations, as recommended by the American Medical Association's Code of Ethics, I have informed the patient of my clinical impression; the nature and purpose of the treatment or procedure; the risks, benefits, and possible complications of the intervention; the alternatives, including doing nothing; the risk(s) and benefit(s) of the alternative treatment(s) or procedure(s); and the risk(s) and benefit(s) of doing nothing. The patient was provided information about the general risks and possible complications associated with the procedure. These may include, but are not limited to: failure to achieve desired goals, infection, bleeding, organ or nerve damage, allergic reactions, paralysis, and death. In addition, the patient was informed of those risks and complications associated to the procedure, such as failure to decrease pain; infection; bleeding; organ or nerve damage with subsequent damage to sensory, motor, and/or autonomic systems, resulting in permanent pain, numbness, and/or weakness of one or several areas of the body; allergic reactions; (i.e.: anaphylactic reaction); and/or death. Furthermore, the patient was informed of those risks and complications associated with the medications. These include, but are  not limited to: allergic reactions (i.e.: anaphylactic or anaphylactoid reaction(s)); adrenal axis suppression; blood sugar elevation that in diabetics may result in ketoacidosis or comma; water retention that in patients with history of congestive heart failure may result  in shortness of breath, pulmonary edema, and decompensation with resultant heart failure; weight gain; swelling or edema; medication-induced neural toxicity; particulate matter embolism and blood vessel occlusion with resultant organ, and/or nervous system infarction; and/or aseptic necrosis of one or more joints. Finally, the patient was informed that Medicine is not an exact science; therefore, there is also the possibility of unforeseen or unpredictable risks and/or possible complications that may result in a catastrophic outcome. The patient indicated having understood very clearly. We have given the patient no guarantees and we have made no promises. Enough time was given to the patient to ask questions, all of which were answered to the patient's satisfaction. Howard Murphy has indicated that he wanted to continue with the procedure. Attestation: I, the ordering provider, attest that I have discussed with the patient the benefits, risks, side-effects, alternatives, likelihood of achieving goals, and potential problems during recovery for the procedure that I have provided informed consent. Date: 02/28/2017; Time: 7:38 AM  Pre-Procedure Preparation:  Monitoring: As per clinic protocol. Respiration, ETCO2, SpO2, BP, heart rate and rhythm monitor placed and checked for adequate function Safety Precautions: Patient was assessed for positional comfort and pressure points before starting the procedure. Time-out: I initiated and conducted the "Time-out" before starting the procedure, as per protocol. The patient was asked to participate by confirming the accuracy of the "Time Out" information. Verification of the correct person, site, and  procedure were performed and confirmed by me, the nursing staff, and the patient. "Time-out" conducted as per Joint Commission's Universal Protocol (UP.01.01.01). "Time-out" Date & Time: 02/28/2017;   hrs.  Description of Procedure Process:   Position: Supine Target Area: Glonohumeral Joint (shoulder) Approach: Anterior approach. Area Prepped: Entire shoulder Area Prepping solution: ChloraPrep (2% chlorhexidine gluconate and 70% isopropyl alcohol) Safety Precautions: Aspiration looking for blood return was conducted prior to all injections. At no point did we inject any substances, as a needle was being advanced. No attempts were made at seeking any paresthesias. Safe injection practices and needle disposal techniques used. Medications properly checked for expiration dates. SDV (single dose vial) medications used. Description of the Procedure: Protocol guidelines were followed. The patient was placed in position over the procedure table. The target area was identified and the area prepped in the usual manner. Skin & deeper tissues infiltrated with local anesthetic. Appropriate amount of time allowed to pass for local anesthetics to take effect. The procedure needles were then advanced to the target area. Proper needle placement secured. Negative aspiration confirmed. Solution injected in intermittent fashion, asking for systemic symptoms every 0.5cc of injectate. The needles were then removed and the area cleansed, making sure to leave some of the prepping solution back to take advantage of its long term bactericidal properties. There were no vitals filed for this visit.  Start Time:   hrs. End Time:   hrs. Materials:  Needle(s) Type: Regular needle Gauge: 22G Length: 3.5-in Medication(s): Mr. Palos had no medications administered during this visit. Please see chart orders for dosing details.  Imaging Guidance (Non-Spinal):  Type of Imaging Technique: Fluoroscopy Guidance  (Non-Spinal) Indication(s): Assistance in needle guidance and placement for procedures requiring needle placement in or near specific anatomical locations not easily accessible without such assistance. Exposure Time: Please see nurses notes. Contrast: None used. Fluoroscopic Guidance: I was personally present during the use of fluoroscopy. "Tunnel Vision Technique" used to obtain the best possible view of the target area. Parallax error corrected before commencing the procedure. "Direction-depth-direction" technique used to introduce the  needle under continuous pulsed fluoroscopy. Once target was reached, antero-posterior, oblique, and lateral fluoroscopic projection used confirm needle placement in all planes. Images permanently stored in EMR. Interpretation: No contrast injected. I personally interpreted the imaging intraoperatively. Adequate needle placement confirmed in multiple planes. Permanent images saved into the patient's record.  Antibiotic Prophylaxis:  Indication(s): None identified Antibiotic given: None  Post-operative Assessment:  EBL: None Complications: No immediate post-treatment complications observed by team, or reported by patient. Note: The patient tolerated the entire procedure well. A repeat set of vitals were taken after the procedure and the patient was kept under observation following institutional policy, for this type of procedure. Post-procedural neurological assessment was performed, showing return to baseline, prior to discharge. The patient was provided with post-procedure discharge instructions, including a section on how to identify potential problems. Should any problems arise concerning this procedure, the patient was given instructions to immediately contact us, at any time, without hesitation. In any case, we plan to contact the patient by telephone for a follow-up status report regarding this interventional procedure. Comments:  No additional relevant  information.  Plan of Care  Disposition: Discharge home  Discharge Date & Time: 02/28/2017;   hrs.   Physician-requested Follow-up:  No Follow-up on file.  Future Appointments Date Time Provider [place]  02/28/2017 1:45 PM Milinda Pointer, MD [facility] None  03/07/2017 1:20 PM Arnetha Courser, MD [place] None  03/28/2017 2:15 PM Milinda Pointer, MD [facility] None   Imaging Orders  No imaging studies ordered today   Procedure Orders    No procedure(s) ordered today    Medications ordered for procedure: No orders of the defined types were placed in this encounter.  Medications administered: Mr. Walgren had no medications administered during this visit.  See the medical record for exact dosing, route, and time of administration.  New Prescriptions   No medications on file   Primary Care Physician: Arnetha Courser, MD Location: [facility] Note by: Gaspar Cola, MD Date: 02/28/2017; Time: 7:38 AM  Disclaimer:  Medicine is not an exact science. The only guarantee in medicine is that nothing is guaranteed. It is important to note that the decision to proceed with this intervention was based on the information collected from the patient. The Data and conclusions were drawn from the patient's questionnaire, the interview, and the physical examination. Because the information was provided in large part by the patient, it cannot be guaranteed that it has not been purposely or unconsciously manipulated. Every effort has been made to obtain as much relevant data as possible for this evaluation. It is important to note that the conclusions that lead to this procedure are derived in large part from the available data. Always take into account that the treatment will also be dependent on availability of resources and existing treatment guidelines, considered by other Pain Management Practitioners as being common knowledge and practice, at the  time of the intervention. For Medico-Legal purposes, it is also important to point out that variation in procedural techniques and pharmacological choices are the acceptable norm. The indications, contraindications, technique, and results of the above procedure should only be interpreted and judged by a Board-Certified Interventional Pain Specialist with extensive familiarity and expertise in the same exact procedure and technique.

## 2017-03-05 ENCOUNTER — Ambulatory Visit: Payer: Medicare Other | Admitting: Pain Medicine

## 2017-03-05 ENCOUNTER — Ambulatory Visit
Admission: RE | Admit: 2017-03-05 | Discharge: 2017-03-05 | Disposition: A | Discharge status: Home / Self Care | Payer: Medicare Other | Source: Ambulatory Visit | Attending: Pain Medicine | Admitting: Pain Medicine

## 2017-03-05 ENCOUNTER — Encounter: Payer: Self-pay | Admitting: Pain Medicine

## 2017-03-05 VITALS — BP 173/97 | HR 72 | Temp 98.3°F | Resp 17 | Ht 67.0 in | Wt 200.0 lb

## 2017-03-05 DIAGNOSIS — M25512 Pain in left shoulder: Secondary | ICD-10-CM | POA: Insufficient documentation

## 2017-03-05 DIAGNOSIS — M19011 Primary osteoarthritis, right shoulder: Secondary | ICD-10-CM | POA: Diagnosis not present

## 2017-03-05 DIAGNOSIS — G8929 Other chronic pain: Secondary | ICD-10-CM | POA: Diagnosis not present

## 2017-03-05 DIAGNOSIS — M19012 Primary osteoarthritis, left shoulder: Secondary | ICD-10-CM | POA: Diagnosis not present

## 2017-03-05 DIAGNOSIS — M25511 Pain in right shoulder: Principal | ICD-10-CM

## 2017-03-05 MED ORDER — METHYLPREDNISOLONE ACETATE 80 MG/ML IJ SUSP
80.0000 mg | Freq: Once | INTRAMUSCULAR | Status: AC
  Administered 2017-03-05: 80 mg
  Filled 2017-03-05: qty 1

## 2017-03-05 MED ORDER — ROPIVACAINE HCL 2 MG/ML IJ SOLN
9.0000 mL | Freq: Once | INTRAMUSCULAR | Status: AC
  Administered 2017-03-05: 10 mL
  Filled 2017-03-05: qty 10

## 2017-03-05 MED ORDER — LIDOCAINE HCL 2 % IJ SOLN
20.0000 mL | Freq: Once | INTRAMUSCULAR | Status: AC
  Administered 2017-03-05: 400 mg
  Filled 2017-03-05: qty 100

## 2017-03-05 NOTE — Progress Notes (Signed)
Safety precautions to be maintained throughout the outpatient stay will include: orient to surroundings, keep bed in low position, maintain call bell within reach at all times, provide assistance with transfer out of bed and ambulation.  

## 2017-03-05 NOTE — Patient Instructions (Addendum)

## 2017-03-05 NOTE — Progress Notes (Signed)
Patient's Name: Howard Murphy  MRN: [id number]  Referring Provider: Arnetha Courser, MD  DOB: 1956/03/16  PCP: Arnetha Courser, MD  DOS: 03/05/2017  Note by: Gaspar Cola, MD  Service setting: Ambulatory outpatient  Specialty: Interventional Pain Management  Patient type: Established  Location: [facility] (AMB) Pain Management Facility  Visit type: Interventional Procedure   Primary Reason for Visit: Interventional Pain Management Treatment. CC: Shoulder Pain (bilateral)  Procedure:  Anesthesia, Analgesia, Anxiolysis:  Type: Therapeutic Glonohumeral Joint (shoulder) Injection Region: Superior Shoulder Area Level: Shoulder Laterality: Bilateral  Type: Local Anesthesia Local Anesthetic: Lidocaine 1% Route: Infiltration (Pratt/IM) IV Access: Declined Sedation: Declined  Indication(s): Analgesia          Indications: 1. Chronic shoulder pain (Primary Source of Pain) (Bilateral) (L>R)   2. 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 Arthropathy of shoulder (Right)    Pain Score: Pre-procedure: 4 /10 Post-procedure: 0-No pain/10  Pre-op Assessment:  Mr. Abdulaziz is a 61 y.o. (year old), male patient, seen today for interventional treatment. He  has a past surgical history that includes Cholecystectomy (2000); Shoulder surgery (Right, 2016); Colonoscopy with propofol (N/A, 04/11/2015); Esophagogastroduodenoscopy (egd) with propofol (N/A, 04/11/2015); Shoulder surgery (Left, 06/2015); and Back surgery (2015). Mr. Batzel has a current medication list which includes the following prescription(s): celecoxib, chlorthalidone, lisinopril, oxycodone hcl, promethazine, ranitidine, rosuvastatin, sitagliptin, and testosterone cypionate. His primarily concern today is the Shoulder Pain (bilateral)  Initial Vital Signs: There were no vitals taken for this visit. BMI: Estimated body mass index is 31.32 kg/m as calculated from the following:   Height as of this encounter: 5\' 7"  (1.702 m).   Weight  as of this encounter: 200 lb (90.7 kg).  Risk Assessment: Allergies: Reviewed. He has No Known Allergies.  Allergy Precautions: None required Coagulopathies: Reviewed. None identified.  Blood-thinner therapy: None at this time Active Infection(s): Reviewed. None identified. Mr. Stuhr is afebrile  Site Confirmation: Mr. Baird was asked to confirm the procedure and laterality before marking the site Procedure checklist: Completed Consent: Before the procedure and under the influence of no sedative(s), amnesic(s), or anxiolytics, the patient was informed of the treatment options, risks and possible complications. To fulfill our ethical and legal obligations, as recommended by the American Medical Association's Code of Ethics, I have informed the patient of my clinical impression; the nature and purpose of the treatment or procedure; the risks, benefits, and possible complications of the intervention; the alternatives, including doing nothing; the risk(s) and benefit(s) of the alternative treatment(s) or procedure(s); and the risk(s) and benefit(s) of doing nothing. The patient was provided information about the general risks and possible complications associated with the procedure. These may include, but are not limited to: failure to achieve desired goals, infection, bleeding, organ or nerve damage, allergic reactions, paralysis, and death. In addition, the patient was informed of those risks and complications associated to the procedure, such as failure to decrease pain; infection; bleeding; organ or nerve damage with subsequent damage to sensory, motor, and/or autonomic systems, resulting in permanent pain, numbness, and/or weakness of one or several areas of the body; allergic reactions; (i.e.: anaphylactic reaction); and/or death. Furthermore, the patient was informed of those risks and complications associated with the medications. These include, but are not limited to: allergic reactions (i.e.:  anaphylactic or anaphylactoid reaction(s)); adrenal axis suppression; blood sugar elevation that in diabetics may result in ketoacidosis or comma; water retention that in patients with history of congestive heart failure may result in shortness of  breath, pulmonary edema, and decompensation with resultant heart failure; weight gain; swelling or edema; medication-induced neural toxicity; particulate matter embolism and blood vessel occlusion with resultant organ, and/or nervous system infarction; and/or aseptic necrosis of one or more joints. Finally, the patient was informed that Medicine is not an exact science; therefore, there is also the possibility of unforeseen or unpredictable risks and/or possible complications that may result in a catastrophic outcome. The patient indicated having understood very clearly. We have given the patient no guarantees and we have made no promises. Enough time was given to the patient to ask questions, all of which were answered to the patient's satisfaction. Mr. Boyajian has indicated that he wanted to continue with the procedure. Attestation: I, the ordering provider, attest that I have discussed with the patient the benefits, risks, side-effects, alternatives, likelihood of achieving goals, and potential problems during recovery for the procedure that I have provided informed consent. Date: 03/05/2017; Time: 8:26 AM  Pre-Procedure Preparation:  Monitoring: As per clinic protocol. Respiration, ETCO2, SpO2, BP, heart rate and rhythm monitor placed and checked for adequate function Safety Precautions: Patient was assessed for positional comfort and pressure points before starting the procedure. Time-out: I initiated and conducted the "Time-out" before starting the procedure, as per protocol. The patient was asked to participate by confirming the accuracy of the "Time Out" information. Verification of the correct person, site, and procedure were performed and confirmed by me,  the nursing staff, and the patient. "Time-out" conducted as per Joint Commission's Universal Protocol (UP.01.01.01). "Time-out" Date & Time: 03/05/2017; 1007 hrs.  Description of Procedure Process:   Position: Supine Target Area: Glonohumeral Joint (shoulder) Approach: Anterior approach. Area Prepped: Entire shoulder Area Prepping solution: ChloraPrep (2% chlorhexidine gluconate and 70% isopropyl alcohol) Safety Precautions: Aspiration looking for blood return was conducted prior to all injections. At no point did we inject any substances, as a needle was being advanced. No attempts were made at seeking any paresthesias. Safe injection practices and needle disposal techniques used. Medications properly checked for expiration dates. SDV (single dose vial) medications used. Description of the Procedure: Protocol guidelines were followed. The patient was placed in position over the procedure table. The target area was identified and the area prepped in the usual manner. Skin & deeper tissues infiltrated with local anesthetic. Appropriate amount of time allowed to pass for local anesthetics to take effect. The procedure needles were then advanced to the target area. Proper needle placement secured. Negative aspiration confirmed. Solution injected in intermittent fashion, asking for systemic symptoms every 0.5cc of injectate. The needles were then removed and the area cleansed, making sure to leave some of the prepping solution back to take advantage of its long term bactericidal properties. Vitals:   03/05/17 0926 03/05/17 1003 03/05/17 1008 03/05/17 1015  BP: (!) 173/90 (!) 178/101 (!) 165/101 (!) 173/97  Pulse: 72     Resp: 16 16 19 17   Temp: 98.3 F (36.8 C)     SpO2: 98% 98% 98% 97%  Weight: 200 lb (90.7 kg)     Height: 5\' 7"  (1.702 m)       Start Time: 1007 hrs. End Time: 1015 hrs. Materials:  Needle(s) Type: Regular needle Gauge: 22G Length: 3.5-in Medication(s): We administered  methylPREDNISolone acetate, lidocaine, and ropivacaine (PF) 2 mg/mL (0.2%). Please see chart orders for dosing details.  Imaging Guidance (Non-Spinal):  Type of Imaging Technique: Fluoroscopy Guidance (Non-Spinal) Indication(s): Assistance in needle guidance and placement for procedures requiring needle placement in or near  specific anatomical locations not easily accessible without such assistance. Exposure Time: Please see nurses notes. Contrast: None used. Fluoroscopic Guidance: I was personally present during the use of fluoroscopy. "Tunnel Vision Technique" used to obtain the best possible view of the target area. Parallax error corrected before commencing the procedure. "Direction-depth-direction" technique used to introduce the needle under continuous pulsed fluoroscopy. Once target was reached, antero-posterior, oblique, and lateral fluoroscopic projection used confirm needle placement in all planes. Images permanently stored in EMR. Interpretation: No contrast injected. I personally interpreted the imaging intraoperatively. Adequate needle placement confirmed in multiple planes. Permanent images saved into the patient's record.  Antibiotic Prophylaxis:  Indication(s): None identified Antibiotic given: None  Post-operative Assessment:  EBL: None Complications: No immediate post-treatment complications observed by team, or reported by patient. Note: The patient tolerated the entire procedure well. A repeat set of vitals were taken after the procedure and the patient was kept under observation following institutional policy, for this type of procedure. Post-procedural neurological assessment was performed, showing return to baseline, prior to discharge. The patient was provided with post-procedure discharge instructions, including a section on how to identify potential problems. Should any problems arise concerning this procedure, the patient was given instructions to immediately contact us,  at any time, without hesitation. In any case, we plan to contact the patient by telephone for a follow-up status report regarding this interventional procedure. Comments:  No additional relevant information.  Plan of Care  Disposition: Discharge home  Discharge Date & Time: 03/05/2017; 1020 hrs.   Physician-requested Follow-up:  Return for RFA procedure under fluoro and sedation: (L) L-FCT RFA.  Future Appointments Date Time Provider [place]  03/07/2017 1:20 PM Arnetha Courser, MD [place] None  03/28/2017 2:15 PM Milinda Pointer, MD [facility] None    Imaging Orders     DG C-Arm 1-60 Min-No Report  Procedure Orders     SHOULDER INJECTION  Medications ordered for procedure: Meds ordered this encounter  Medications  . methylPREDNISolone acetate (DEPO-MEDROL) injection 80 mg  . lidocaine (XYLOCAINE) 2 % (with pres) injection 400 mg  . ropivacaine (PF) 2 mg/mL (0.2%) (NAROPIN) injection 9 mL   Medications administered: We administered methylPREDNISolone acetate, lidocaine, and ropivacaine (PF) 2 mg/mL (0.2%).  See the medical record for exact dosing, route, and time of administration.  New Prescriptions   No medications on file   Primary Care Physician: Arnetha Courser, MD Location: [facility] Note by: Gaspar Cola, MD Date: 03/05/2017; Time: 10:28 AM  Disclaimer:  Medicine is not an exact science. The only guarantee in medicine is that nothing is guaranteed. It is important to note that the decision to proceed with this intervention was based on the information collected from the patient. The Data and conclusions were drawn from the patient's questionnaire, the interview, and the physical examination. Because the information was provided in large part by the patient, it cannot be guaranteed that it has not been purposely or unconsciously manipulated. Every effort has been made to obtain as much relevant data as possible for this  evaluation. It is important to note that the conclusions that lead to this procedure are derived in large part from the available data. Always take into account that the treatment will also be dependent on availability of resources and existing treatment guidelines, considered by other Pain Management Practitioners as being common knowledge and practice, at the time of the intervention. For Medico-Legal purposes, it is also important to point out that variation  in procedural techniques and pharmacological choices are the acceptable norm. The indications, contraindications, technique, and results of the above procedure should only be interpreted and judged by a Board-Certified Interventional Pain Specialist with extensive familiarity and expertise in the same exact procedure and technique.

## 2017-03-06 ENCOUNTER — Telehealth: Payer: Self-pay | Admitting: *Deleted

## 2017-03-06 NOTE — Telephone Encounter (Signed)
Spoke with patient re; shoulder injections on yesterday verbalizes no questions or concerns.

## 2017-03-07 ENCOUNTER — Encounter: Payer: Self-pay | Admitting: Family Medicine

## 2017-03-07 ENCOUNTER — Ambulatory Visit (INDEPENDENT_AMBULATORY_CARE_PROVIDER_SITE_OTHER): Payer: Medicare Other | Admitting: Family Medicine

## 2017-03-07 VITALS — BP 142/82 | HR 94 | Temp 97.7°F | Resp 14 | Wt 195.3 lb

## 2017-03-07 DIAGNOSIS — E1142 Type 2 diabetes mellitus with diabetic polyneuropathy: Secondary | ICD-10-CM | POA: Diagnosis not present

## 2017-03-07 DIAGNOSIS — Z5181 Encounter for therapeutic drug level monitoring: Secondary | ICD-10-CM | POA: Diagnosis not present

## 2017-03-07 DIAGNOSIS — Z125 Encounter for screening for malignant neoplasm of prostate: Secondary | ICD-10-CM

## 2017-03-07 DIAGNOSIS — R7989 Other specified abnormal findings of blood chemistry: Secondary | ICD-10-CM

## 2017-03-07 DIAGNOSIS — Z23 Encounter for immunization: Secondary | ICD-10-CM | POA: Diagnosis not present

## 2017-03-07 DIAGNOSIS — E782 Mixed hyperlipidemia: Secondary | ICD-10-CM | POA: Diagnosis not present

## 2017-03-07 DIAGNOSIS — Z79891 Long term (current) use of opiate analgesic: Secondary | ICD-10-CM | POA: Diagnosis not present

## 2017-03-07 MED ORDER — GABAPENTIN 100 MG PO CAPS: 100.0000 mg | ORAL_CAPSULE | Freq: Every day | ORAL | 0 refills | Status: DC

## 2017-03-07 NOTE — Progress Notes (Signed)
BP (!) 142/82   Pulse 94   Temp 97.7 F (36.5 C) (Oral)   Resp 14   Wt 195 lb 4.8 oz (88.6 kg)   SpO2 94%   BMI 30.59 kg/m    Subjective:    Patient ID: Howard Murphy, male    DOB: 04-Dec-1955, 61 y.o.   MRN: [id number]  HPI: Howard Murphy is a 61 y.o. male  Chief Complaint  Patient presents with  . Follow-up    HPI Patient is here for f/u Type 2 diabetes; januvia, but not metformin but GI upset; quit taking it; neuropathy; last eye exam was a few years ago Thinks he has pneumonia vaccine already  High cholesterol Lab Results  Component Value Date   CHOL 223 (H) 02/24/2016   HDL 44 02/24/2016   LDLCALC 131 (H) 02/24/2016   TRIG 239 (H) 02/24/2016   CHOLHDL 5.1 (H) 02/24/2016  on crestor; fasting today; tries to limit fatty meats; no cheese; no milk  HTN; usually takes two meds but has run out; one month or so; no problems; limits salt  Was getting testosterone, out  GERD; helped by zantac; certain foods are triggers, spaghetti  Depression screen [facility] 2/9 03/07/2017 03/05/2017 02/25/2017 01/28/2017 12/24/2016  Decreased Interest 0 0 0 0 0  Down, Depressed, Hopeless 0 0 0 0 0  PHQ - 2 Score 0 0 0 0 0    Relevant past medical, surgical, family and social history reviewed Past Medical History:  Diagnosis Date  . 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  . Back ache 05/05/2013  . Diabetes mellitus without complication ([place])   . GERD (gastroesophageal reflux disease)   . Headache   . Hyperlipemia   . Hypertension   . Kidney stones   . Nephrolithiasis   . Shortness of breath dyspnea    Past Surgical History:  Procedure Laterality Date  . BACK SURGERY  2015   x3  . BACK SURGERY  08/09/2016  . CHOLECYSTECTOMY  2000  . COLONOSCOPY WITH PROPOFOL N/A 04/11/2015   Procedure: COLONOSCOPY WITH PROPOFOL;  Surgeon: Manya Silvas, MD;  Location: [facility] ENDOSCOPY;  Service: Endoscopy;  Laterality: N/A;  . ESOPHAGOGASTRODUODENOSCOPY (EGD) WITH PROPOFOL N/A 04/11/2015   Procedure: ESOPHAGOGASTRODUODENOSCOPY (EGD) WITH PROPOFOL;  Surgeon: Manya Silvas, MD;  Location: [facility] ENDOSCOPY;  Service: Endoscopy;  Laterality: N/A;  . SHOULDER SURGERY Right 2016  . SHOULDER SURGERY Left 06/2015   Family History  Problem Relation Age of Onset  . Cancer Mother        Breast CA  . Stroke Father   . Heart disease Father   . Cancer Sister   . Cancer Maternal Grandmother   . Cancer Sister   . Bladder Cancer Neg Hx   . Prostate cancer Neg Hx   . Kidney cancer Neg Hx    Social History   Social History  . Marital status: Single    Spouse name: N/A  . Number of children: N/A  . Years of education: N/A   Occupational History  . Not on file.   Social History Main Topics  . Smoking status: Never Smoker  . Smokeless tobacco: Former Systems developer    Types: Chew    Quit date: 01/18/2013  . Alcohol use Yes     Comment: occasional  . Drug use: No  . Sexual activity: Yes   Other Topics Concern  . Not on file   Social History Narrative  . No narrative on file  Interim medical history since last visit reviewed. Allergies and medications reviewed  Review of Systems Per HPI unless specifically indicated above     Objective:    BP (!) 142/82   Pulse 94   Temp 97.7 F (36.5 C) (Oral)   Resp 14   Wt 195 lb 4.8 oz (88.6 kg)   SpO2 94%   BMI 30.59 kg/m   Wt Readings from Last 3 Encounters:  03/07/17 195 lb 4.8 oz (88.6 kg)  03/05/17 200 lb (90.7 kg)  02/25/17 195 lb (88.5 kg)    Physical Exam  Constitutional: He appears well-developed and well-nourished. No distress.  HENT:  Head: Normocephalic and atraumatic.  Eyes: EOM are normal. No scleral icterus.  Neck: No thyromegaly present.  Cardiovascular: Normal rate and regular rhythm.   Pulmonary/Chest: Effort normal and breath sounds normal.  Abdominal: Soft. Bowel sounds are normal. He exhibits no distension.  Musculoskeletal: He exhibits no edema.  Neurological: Coordination normal.  Skin: Skin is  warm and dry. No pallor.  Psychiatric: He has a normal mood and affect. His behavior is normal.   Diabetic Foot Form - Detailed   Diabetic Foot Exam - detailed Diabetic Foot exam was performed with the following findings:  Yes 03/07/2017  6:29 PM  Visual Foot Exam completed.:  Yes  Pulse Foot Exam completed.:  Yes  Right Dorsalis Pedis:  Present Left Dorsalis Pedis:  Present  Semmes-Weinstein Monofilament Test R Site 1-Great Toe:  Pos L Site 1-Great Toe:  Pos        Results for orders placed or performed during the hospital encounter of 32/95/18  Basic metabolic panel  Result Value Ref Range   Sodium 136 135 - 145 mmol/L   Potassium 3.5 3.5 - 5.1 mmol/L   Chloride 104 101 - 111 mmol/L   CO2 25 22 - 32 mmol/L   Glucose, Bld 192 (H) 65 - 99 mg/dL   BUN 9 6 - 20 mg/dL   Creatinine, Ser 0.91 0.61 - 1.24 mg/dL   Calcium 8.8 (L) 8.9 - 10.3 mg/dL   GFR calc non Af Amer >60 >60 mL/min   GFR calc Af Amer >60 >60 mL/min   Anion gap 7 5 - 15  CBC  Result Value Ref Range   WBC 9.3 3.8 - 10.6 K/uL   RBC 5.79 4.40 - 5.90 MIL/uL   Hemoglobin 16.6 13.0 - 18.0 g/dL   HCT 49.1 40.0 - 52.0 %   MCV 84.8 80.0 - 100.0 fL   MCH 28.7 26.0 - 34.0 pg   MCHC 33.9 32.0 - 36.0 g/dL   RDW 14.5 11.5 - 14.5 %   Platelets 202 150 - 440 K/uL  Troponin I  Result Value Ref Range   Troponin I <0.03 <0.03 ng/mL      Assessment & Plan:   Problem List Items Addressed This Visit      Endocrine   Type 2 diabetes mellitus with peripheral neuropathy (HCC) - Primary    Start gabapentin; refer to eye doctor; foot exam by MD      Relevant Medications   gabapentin (NEURONTIN) 100 MG capsule   Other Relevant Orders   Ambulatory referral to Ophthalmology   Microalbumin / creatinine urine ratio   Lipid panel   Hemoglobin A1c     Other   Medication monitoring encounter    Check liver and kidneys      Relevant Orders   Comprehensive metabolic panel   Low testosterone    Discussed cardiac  risk; will  refer to urologist for discussion and consideration of clomid versus testosterone therapy      Relevant Orders   Ambulatory referral to Urology   Long term prescription opiate use (Chronic)    Likely impacting testosterone level; patient aware      Hyperlipidemia    Check lipids today; off of statin for about a month; trying to limit saturated fats      Relevant Orders   Lipid panel    Other Visit Diagnoses    Needs flu shot       Relevant Orders   Flu Vaccine QUAD 6+ mos PF IM (Fluarix Quad PF) (Completed)   Screening for prostate cancer       Relevant Orders   PSA       Follow up plan: Return in about 3 months (around 06/06/2017) for follow-up visit with Dr. Sanda Klein.  An after-visit summary was printed and given to the patient at [place].  Please see the patient instructions which may contain other information and recommendations beyond what is mentioned above in the assessment and plan.  Meds ordered this encounter  Medications  . gabapentin (NEURONTIN) 100 MG capsule    Sig: Take 1 capsule (100 mg total) by mouth at bedtime.    Dispense:  30 capsule    Refill:  0    Orders Placed This Encounter  Procedures  . Flu Vaccine QUAD 6+ mos PF IM (Fluarix Quad PF)  . Microalbumin / creatinine urine ratio  . Lipid panel  . Hemoglobin A1c  . PSA  . Comprehensive metabolic panel  . Ambulatory referral to Ophthalmology  . Ambulatory referral to Urology

## 2017-03-07 NOTE — Assessment & Plan Note (Signed)
Check lipids today; off of statin for about a month; trying to limit saturated fats

## 2017-03-07 NOTE — Assessment & Plan Note (Signed)
Discussed cardiac risk; will refer to urologist for discussion and consideration of clomid versus testosterone therapy

## 2017-03-07 NOTE — Patient Instructions (Signed)
We'll have you see Howard Murphy get labs today Start the gabapentin at bedtime for nerve pain Let me know in two weeks how it's working, and we can increase further if needed Please do see your eye doctor regularly, and have your eyes examined every year (or more often per his or her recommendation) Check your feet every night and let me know right away of any sores, infections, numbness, etc. Try to limit sweets, white bread, white rice, white potatoes It is okay with me for you to not check your fingerstick blood sugars (per [employer] of Endocrinology Best Practices), unless you are interested and feel it would be helpful for you

## 2017-03-07 NOTE — Assessment & Plan Note (Signed)
Likely impacting testosterone level; patient aware

## 2017-03-07 NOTE — Assessment & Plan Note (Signed)
Check liver and kidneys 

## 2017-03-07 NOTE — Assessment & Plan Note (Signed)
Start gabapentin; refer to eye doctor; foot exam by MD

## 2017-03-08 ENCOUNTER — Other Ambulatory Visit: Payer: Self-pay

## 2017-03-08 ENCOUNTER — Other Ambulatory Visit: Payer: Self-pay | Admitting: Family Medicine

## 2017-03-08 DIAGNOSIS — E1142 Type 2 diabetes mellitus with diabetic polyneuropathy: Secondary | ICD-10-CM

## 2017-03-08 LAB — COMPREHENSIVE METABOLIC PANEL
A/G RATIO: 1.9 (ref 1.2–2.2)
ALK PHOS: 54 IU/L (ref 39–117)
ALT: 22 IU/L (ref 0–44)
AST: 20 IU/L (ref 0–40)
Albumin: 4.5 g/dL (ref 3.6–4.8)
BILIRUBIN TOTAL: 0.9 mg/dL (ref 0.0–1.2)
BUN/Creatinine Ratio: 14 (ref 10–24)
BUN: 14 mg/dL (ref 8–27)
CHLORIDE: 101 mmol/L (ref 96–106)
CO2: 26 mmol/L (ref 20–29)
Calcium: 9.5 mg/dL (ref 8.6–10.2)
Creatinine, Ser: 1.01 mg/dL (ref 0.76–1.27)
GFR calc non Af Amer: 80 mL/min/{1.73_m2} (ref >59)
GFR, EST AFRICAN AMERICAN: 92 mL/min/{1.73_m2} (ref >59)
GLUCOSE: 114 mg/dL — AB (ref 65–99)
Globulin, Total: 2.4 g/dL (ref 1.5–4.5)
POTASSIUM: 4.5 mmol/L (ref 3.5–5.2)
Sodium: 142 mmol/L (ref 134–144)
TOTAL PROTEIN: 6.9 g/dL (ref 6.0–8.5)

## 2017-03-08 LAB — PSA: PROSTATE SPECIFIC AG, SERUM: 2.1 ng/mL (ref 0.0–4.0)

## 2017-03-08 LAB — LIPID PANEL
CHOL/HDL RATIO: 4.3 ratio (ref 0.0–5.0)
Cholesterol, Total: 166 mg/dL (ref 100–199)
HDL: 39 mg/dL — ABNORMAL LOW (ref >39)
LDL Calculated: 98 mg/dL (ref 0–99)
TRIGLYCERIDES: 143 mg/dL (ref 0–149)
VLDL Cholesterol Cal: 29 mg/dL (ref 5–40)

## 2017-03-08 LAB — MICROALBUMIN / CREATININE URINE RATIO
CREATININE, UR: 270.6 mg/dL
MICROALB/CREAT RATIO: 21.6 mg/g{creat} (ref 0.0–30.0)
MICROALBUM., U, RANDOM: 58.5 ug/mL

## 2017-03-08 LAB — HEMOGLOBIN A1C
Est. average glucose Bld gHb Est-mCnc: 151 mg/dL
HEMOGLOBIN A1C: 6.9 % — AB (ref 4.8–5.6)

## 2017-03-08 MED ORDER — ATORVASTATIN CALCIUM 40 MG PO TABS: 40.0000 mg | ORAL_TABLET | Freq: Every day | ORAL | 5 refills | Status: AC

## 2017-03-08 MED ORDER — SITAGLIPTIN PHOSPHATE 50 MG PO TABS: 50.0000 mg | ORAL_TABLET | Freq: Every day | ORAL | 5 refills | Status: DC

## 2017-03-08 MED ORDER — ROSUVASTATIN CALCIUM 40 MG PO TABS: 40.0000 mg | ORAL_TABLET | Freq: Every day | ORAL | 5 refills | Status: DC

## 2017-03-08 MED ORDER — CHLORTHALIDONE 25 MG PO TABS: 25.0000 mg | ORAL_TABLET | Freq: Every day | ORAL | 5 refills | Status: DC

## 2017-03-08 MED ORDER — LISINOPRIL 20 MG PO TABS: 20.0000 mg | ORAL_TABLET | Freq: Every day | ORAL | 5 refills | Status: DC

## 2017-03-08 NOTE — Telephone Encounter (Signed)
I'm decreasing thiazide from 50 mg to 25 mg since he's been out for a while; continue ACE-I

## 2017-03-08 NOTE — Addendum Note (Signed)
Addended by: Avalynn Bowe, Satira Anis on: 03/08/2017 04:33 PM   Modules accepted: Orders

## 2017-03-08 NOTE — Telephone Encounter (Signed)
Patient requesting refill of Chlorthalidone to [facility]. Patient fiance Orson Eva states the Lisinopril was sent in but it will not keep his BP down alone by its self and needs the combination.

## 2017-03-28 ENCOUNTER — Ambulatory Visit (HOSPITAL_BASED_OUTPATIENT_CLINIC_OR_DEPARTMENT_OTHER): Payer: Medicare Other | Admitting: Pain Medicine

## 2017-03-28 ENCOUNTER — Ambulatory Visit
Admission: RE | Admit: 2017-03-28 | Discharge: 2017-03-28 | Disposition: A | Discharge status: Home / Self Care | Payer: Medicare Other | Source: Ambulatory Visit | Attending: Pain Medicine | Admitting: Pain Medicine

## 2017-03-28 VITALS — BP 142/97 | HR 86 | Temp 97.5°F | Resp 16 | Ht 67.0 in | Wt 200.0 lb

## 2017-03-28 DIAGNOSIS — M4310 Spondylolisthesis, site unspecified: Secondary | ICD-10-CM

## 2017-03-28 DIAGNOSIS — G8918 Other acute postprocedural pain: Secondary | ICD-10-CM | POA: Insufficient documentation

## 2017-03-28 DIAGNOSIS — M545 Low back pain: Secondary | ICD-10-CM | POA: Insufficient documentation

## 2017-03-28 DIAGNOSIS — M47816 Spondylosis without myelopathy or radiculopathy, lumbar region: Secondary | ICD-10-CM | POA: Diagnosis not present

## 2017-03-28 DIAGNOSIS — G8929 Other chronic pain: Secondary | ICD-10-CM | POA: Diagnosis present

## 2017-03-28 DIAGNOSIS — M47896 Other spondylosis, lumbar region: Secondary | ICD-10-CM | POA: Diagnosis not present

## 2017-03-28 DIAGNOSIS — M7138 Other bursal cyst, other site: Secondary | ICD-10-CM | POA: Insufficient documentation

## 2017-03-28 DIAGNOSIS — G894 Chronic pain syndrome: Secondary | ICD-10-CM

## 2017-03-28 MED ORDER — OXYCODONE-ACETAMINOPHEN 5-325 MG PO TABS: 1.0000 | ORAL_TABLET | Freq: Four times a day (QID) | ORAL | 0 refills | Status: DC | PRN

## 2017-03-28 MED ORDER — TRIAMCINOLONE ACETONIDE 40 MG/ML IJ SUSP
40.0000 mg | Freq: Once | INTRAMUSCULAR | Status: AC
  Administered 2017-03-28: 40 mg
  Filled 2017-03-28: qty 1

## 2017-03-28 MED ORDER — MIDAZOLAM HCL 5 MG/5ML IJ SOLN
1.0000 mg | INTRAMUSCULAR | Status: DC | PRN
  Administered 2017-03-28: 4 mg via INTRAVENOUS
  Filled 2017-03-28: qty 5

## 2017-03-28 MED ORDER — ROPIVACAINE HCL 2 MG/ML IJ SOLN
9.0000 mL | Freq: Once | INTRAMUSCULAR | Status: AC
  Administered 2017-03-28: 10 mL via PERINEURAL
  Filled 2017-03-28: qty 10

## 2017-03-28 MED ORDER — LACTATED RINGERS IV SOLN
1000.0000 mL | Freq: Once | INTRAVENOUS | Status: AC
  Administered 2017-03-28: 1000 mL via INTRAVENOUS

## 2017-03-28 MED ORDER — OXYCODONE HCL 10 MG PO TABS: 10.0000 mg | ORAL_TABLET | Freq: Four times a day (QID) | ORAL | 0 refills | Status: DC | PRN

## 2017-03-28 MED ORDER — LIDOCAINE HCL 2 % IJ SOLN
10.0000 mL | Freq: Once | INTRAMUSCULAR | Status: AC
  Administered 2017-03-28: 400 mg
  Filled 2017-03-28: qty 20

## 2017-03-28 MED ORDER — FENTANYL CITRATE (PF) 100 MCG/2ML IJ SOLN
25.0000 ug | INTRAMUSCULAR | Status: DC | PRN
Start: 2017-03-28 — End: 2017-03-28
  Administered 2017-03-28: 100 ug via INTRAVENOUS
  Filled 2017-03-28: qty 2

## 2017-03-28 NOTE — Progress Notes (Signed)
Patient's Name: Howard Murphy  MRN: [id number]  Referring Provider: Milinda Pointer, MD  DOB: [birth year]  PCP: Arnetha Courser, MD  DOS: 03/28/2017  Note by: Gaspar Cola, MD  Service setting: Ambulatory outpatient  Specialty: Interventional Pain Management  Patient type: Established  Location: [facility] (AMB) Pain Management Facility  Visit type: Interventional Procedure   Primary Reason for Visit: Interventional Pain Management Treatment. CC: Back Pain (lower back,radiating down left leg)  Procedure:  Anesthesia, Analgesia, Anxiolysis:  Type: Therapeutic Medial Branch Facet Radiofrequency Ablation Region: Lumbar Level: L2, L3, L4, L5, & S1 Medial Branch Level(s) Laterality: Left-Sided  Type: Local Anesthesia with Moderate (Conscious) Sedation Local Anesthetic: Lidocaine 1% Route: Intravenous (IV) IV Access: Secured Sedation: Meaningful verbal contact was maintained at all times during the procedure  Indication(s): Analgesia and Anxiety   Indications: 1. Lumbar facet joint syndrome ([place])   2. Lumbar facet hypertrophy   3. Spondylosis of lumbar spine   4. Chronic low back pain (Primary Source of Pain) (Bilateral) (L>R)   5. Acute postoperative pain   6. Lumbar facet joint syndrome   7. Lumbar facet (L4-5) synovial cyst (6 mm) (Left)   8. History of lumbar facet Synovial cyst, surgically removed.   9. Grade 1 Anterolisthesis (2 mm) of L4 over L5.   10. Chronic pain syndrome    Mr. Hooton has either failed to respond, was unable to tolerate, or simply did not get enough benefit from other more conservative therapies including, but not limited to: 1. Over-the-counter medications 2. Anti-inflammatory medications 3. Muscle relaxants 4. Membrane stabilizers 5. Opioids 6. Physical therapy 7. Modalities (Heat, ice, etc.) 8. Invasive techniques such as nerve blocks. Mr. Mccowen has attained more than 50% relief of the pain from a series of diagnostic injections conducted in  separate occasions.  Pain Score: Pre-procedure: 4 /10 Post-procedure: 0-No pain/10  Pre-op Assessment:  Mr. Isbell is a 61 y.o. (year old), male patient, seen today for interventional treatment. He  has a past surgical history that includes Cholecystectomy (2000); Shoulder surgery (Right, 2016); Colonoscopy with propofol (N/A, 04/11/2015); Esophagogastroduodenoscopy (egd) with propofol (N/A, 04/11/2015); Shoulder surgery (Left, 06/2015); Back surgery (2015); and Back surgery (08/09/2016). Mr. Markson has a current medication list which includes the following prescription(s): atorvastatin, celecoxib, chlorthalidone, gabapentin, lisinopril, oxycodone-acetaminophen, promethazine, ranitidine, sitagliptin, testosterone cypionate, and oxycodone hcl, and the following Facility-Administered Medications: fentanyl and midazolam. His primarily concern today is the Back Pain (lower back,radiating down left leg)  Initial Vital Signs: There were no vitals taken for this visit. BMI: Estimated body mass index is 31.32 kg/m as calculated from the following:   Height as of this encounter: 5\' 7"  (1.702 m).   Weight as of this encounter: 200 lb (90.7 kg).  Risk Assessment: Allergies: Reviewed. He has No Known Allergies.  Allergy Precautions: None required Coagulopathies: Reviewed. None identified.  Blood-thinner therapy: None at this time Active Infection(s): Reviewed. None identified. Mr. Vorndran is afebrile  Site Confirmation: Mr. Fluegel was asked to confirm the procedure and laterality before marking the site Procedure checklist: Completed Consent: Before the procedure and under the influence of no sedative(s), amnesic(s), or anxiolytics, the patient was informed of the treatment options, risks and possible complications. To fulfill our ethical and legal obligations, as recommended by the American Medical Association's Code of Ethics, I have informed the patient of my clinical impression; the nature and  purpose of the treatment or procedure; the risks, benefits, and possible complications of the intervention; the alternatives, including doing nothing;  the risk(s) and benefit(s) of the alternative treatment(s) or procedure(s); and the risk(s) and benefit(s) of doing nothing. The patient was provided information about the general risks and possible complications associated with the procedure. These may include, but are not limited to: failure to achieve desired goals, infection, bleeding, organ or nerve damage, allergic reactions, paralysis, and death. In addition, the patient was informed of those risks and complications associated to Spine-related procedures, such as failure to decrease pain; infection (i.e.: Meningitis, epidural or intraspinal abscess); bleeding (i.e.: epidural hematoma, subarachnoid hemorrhage, or any other type of intraspinal or peri-dural bleeding); organ or nerve damage (i.e.: Any type of peripheral nerve, nerve root, or spinal cord injury) with subsequent damage to sensory, motor, and/or autonomic systems, resulting in permanent pain, numbness, and/or weakness of one or several areas of the body; allergic reactions; (i.e.: anaphylactic reaction); and/or death. Furthermore, the patient was informed of those risks and complications associated with the medications. These include, but are not limited to: allergic reactions (i.e.: anaphylactic or anaphylactoid reaction(s)); adrenal axis suppression; blood sugar elevation that in diabetics may result in ketoacidosis or comma; water retention that in patients with history of congestive heart failure may result in shortness of breath, pulmonary edema, and decompensation with resultant heart failure; weight gain; swelling or edema; medication-induced neural toxicity; particulate matter embolism and blood vessel occlusion with resultant organ, and/or nervous system infarction; and/or aseptic necrosis of one or more joints. Finally, the patient was  informed that Medicine is not an exact science; therefore, there is also the possibility of unforeseen or unpredictable risks and/or possible complications that may result in a catastrophic outcome. The patient indicated having understood very clearly. We have given the patient no guarantees and we have made no promises. Enough time was given to the patient to ask questions, all of which were answered to the patient's satisfaction. Mr. Ribas has indicated that he wanted to continue with the procedure. Attestation: I, the ordering provider, attest that I have discussed with the patient the benefits, risks, side-effects, alternatives, likelihood of achieving goals, and potential problems during recovery for the procedure that I have provided informed consent. Date: 03/28/2017; Time: 11:29 AM  Pre-Procedure Preparation:  Monitoring: As per clinic protocol. Respiration, ETCO2, SpO2, BP, heart rate and rhythm monitor placed and checked for adequate function Safety Precautions: Patient was assessed for positional comfort and pressure points before starting the procedure. Time-out: I initiated and conducted the "Time-out" before starting the procedure, as per protocol. The patient was asked to participate by confirming the accuracy of the "Time Out" information. Verification of the correct person, site, and procedure were performed and confirmed by me, the nursing staff, and the patient. "Time-out" conducted as per Joint Commission's Universal Protocol (UP.01.01.01). "Time-out" Date & Time: 03/28/2017; 1342 hrs.  Description of Procedure Process:   Position: Prone Target Area: For Lumbar Facet blocks, the target is the groove formed by the junction of the transverse process and superior articular process. For the L5 dorsal ramus, the target is the notch between superior articular process and sacral ala. For the S1 dorsal ramus, the target is the superior and lateral edge of the posterior S1 Sacral  foramen. Approach: Paraspinal approach. Area Prepped: Entire Posterior Lumbosacral Region Prepping solution: Hibiclens (4.0% Chlorhexidine gluconate solution) Safety Precautions: Aspiration looking for blood return was conducted prior to all injections. At no point did we inject any substances, as a needle was being advanced. No attempts were made at seeking any paresthesias. Safe injection practices and  needle disposal techniques used. Medications properly checked for expiration dates. SDV (single dose vial) medications used. Description of the Procedure: Protocol guidelines were followed. The patient was placed in position over the fluoroscopy table. The target area was identified and the area prepped in the usual manner. Skin desensitized using vapocoolant spray. Skin & deeper tissues infiltrated with local anesthetic. Appropriate amount of time allowed to pass for local anesthetics to take effect. Radiofrequency needles were introduced to the area of the medial branch at the junction of the superior articular process and transverse process using fluoroscopy. Using the [employer], sensory stimulation using 50 Hz was used to locate & identify the nerve, making sure that the needle was positioned such that there was no sensory stimulation below 0.3 V or above 0.7 V. Stimulation using 2 Hz was used to evaluate the motor component. Care was taken not to lesion any nerves that demonstrated motor stimulation of the lower extremities at an output of less than 2.5 times that of the sensory threshold, or a maximum of 2.0 V. Once satisfactory placement of the needles was achieved, the above solution was slowly injected after negative aspiration. After waiting for at least 2 minutes, the ablation was performed at 80 degrees C for 60 seconds.The needles were then removed and the area cleansed, making sure to leave some of the prepping solution back to take advantage of its long term bactericidal  properties. Intra-operative Compliance: Compliant  Illustration of the posterior view of the lumbar spine and the posterior neural structures. Laminae of L2 through S1 are labeled. DPRL5, dorsal primary ramus of L5; DPRS1, dorsal primary ramus of S1; DPR3, dorsal primary ramus of L3; FJ, facet (zygapophyseal) joint L3-L4; I, inferior articular process of L4; LB1, lateral branch of dorsal primary ramus of L1; IAB, inferior articular branches from L3 medial branch (supplies L4-L5 facet joint); IBP, intermediate branch plexus; MB3, medial branch of dorsal primary ramus of L3; NR3, third lumbar nerve root; S, superior articular process of L5; SAB, superior articular branches from L4 (supplies L4-5 facet joint also); TP3, transverse process of L3.  Vitals:   03/28/17 1421 03/28/17 1431 03/28/17 1439 03/28/17 1450  BP: 127/83 (!) 151/85 (!) 130/91 (!) 142/97  Pulse: 86     Resp: 13 12 16 16   Temp:   (!) 97.5 F (36.4 C)   TempSrc:   Temporal   SpO2: 99% 96% 95% 96%  Weight:      Height:        Start Time: 1344 hrs. End Time: 1419 hrs. Materials & Medications:  Needle(s) Type: Teflon-coated, curved tip, Radiofrequency needle(s) Gauge: 22G Length: 10cm Medication(s): We administered lactated ringers, midazolam, fentaNYL, lidocaine, triamcinolone acetonide, and ropivacaine (PF) 2 mg/mL (0.2%). Please see chart orders for dosing details.  Imaging Guidance (Spinal):  Type of Imaging Technique: Fluoroscopy Guidance (Spinal) Indication(s): Assistance in needle guidance and placement for procedures requiring needle placement in or near specific anatomical locations not easily accessible without such assistance. Exposure Time: Please see nurses notes. Contrast: None used. Fluoroscopic Guidance: I was personally present during the use of fluoroscopy. "Tunnel Vision Technique" used to obtain the best possible view of the target area. Parallax error corrected before commencing the procedure.  "Direction-depth-direction" technique used to introduce the needle under continuous pulsed fluoroscopy. Once target was reached, antero-posterior, oblique, and lateral fluoroscopic projection used confirm needle placement in all planes. Images permanently stored in EMR. Interpretation: No contrast injected. I personally interpreted the imaging intraoperatively. Adequate needle placement  confirmed in multiple planes. Permanent images saved into the patient's record.  Antibiotic Prophylaxis:  Indication(s): None identified Antibiotic given: None  Post-operative Assessment:  EBL: None Complications: No immediate post-treatment complications observed by team, or reported by patient. Note: The patient tolerated the entire procedure well. A repeat set of vitals were taken after the procedure and the patient was kept under observation following institutional policy, for this type of procedure. Post-procedural neurological assessment was performed, showing return to baseline, prior to discharge. The patient was provided with post-procedure discharge instructions, including a section on how to identify potential problems. Should any problems arise concerning this procedure, the patient was given instructions to immediately contact us, at any time, without hesitation. In any case, we plan to contact the patient by telephone for a follow-up status report regarding this interventional procedure. Comments:  No additional relevant information.  Plan of Care    Imaging Orders     DG C-Arm 1-60 Min-No Report  Procedure Orders     Radiofrequency,Lumbar  Medications ordered for procedure: Meds ordered this encounter  Medications  . lactated ringers infusion 1,000 mL  . midazolam (VERSED) 5 MG/5ML injection 1-2 mg    Make sure Flumazenil is available in the pyxis when using this medication. If oversedation occurs, administer 0.2 mg IV over 15 sec. If after 45 sec no response, administer 0.2 mg again over 1  min; may repeat at 1 min intervals; not to exceed 4 doses (1 mg)  . fentaNYL (SUBLIMAZE) injection 25-50 mcg    Make sure Narcan is available in the pyxis when using this medication. In the event of respiratory depression (RR< 8/min): Titrate NARCAN (naloxone) in increments of 0.1 to 0.2 mg IV at 2-3 minute intervals, until desired degree of reversal.  . lidocaine (XYLOCAINE) 2 % (with pres) injection 200 mg  . triamcinolone acetonide (KENALOG-40) injection 40 mg  . ropivacaine (PF) 2 mg/mL (0.2%) (NAROPIN) injection 9 mL  . oxyCODONE-acetaminophen (PERCOCET) 5-325 MG tablet    Sig: Take 1 tablet by mouth every 6 (six) hours as needed for severe pain.    Dispense:  28 tablet    Refill:  0    For acute post-operative pain. Not to be refilled. To last 7 days.  . Oxycodone HCl 10 MG TABS    Sig: Take 1 tablet (10 mg total) by mouth every 6 (six) hours as needed.    Dispense:  120 tablet    Refill:  0    Do not place this medication, or any other prescription from our practice, on "Automatic Refill". Patient may have prescription filled one day early if pharmacy is closed on scheduled refill date. Do not fill until: 03/28/2017 To last until: 04/27/2017   Medications administered: We administered lactated ringers, midazolam, fentaNYL, lidocaine, triamcinolone acetonide, and ropivacaine (PF) 2 mg/mL (0.2%).  See the medical record for exact dosing, route, and time of administration.  New Prescriptions   OXYCODONE-ACETAMINOPHEN (PERCOCET) 5-325 MG TABLET    Take 1 tablet by mouth every 6 (six) hours as needed for severe pain.   Disposition: Discharge home  Discharge Date & Time: 03/28/2017; 1454 hrs.   Physician-requested Follow-up: Return for post-RFA eval (6 wks)(w/ Dionisio Berton, NP). Future Appointments Date Time Provider [place]  04/01/2017 3:45 PM [facility] None  05/09/2017 1:30 PM Vevelyn Francois, NP [facility] None   Primary Care Physician: Arnetha Courser, MD Location: [facility] Note by: Gaspar Cola, MD  Date: 03/28/2017; Time: 3:59 PM  Disclaimer:  Medicine is not an Chief Strategy Officer. The only guarantee in medicine is that nothing is guaranteed. It is important to note that the decision to proceed with this intervention was based on the information collected from the patient. The Data and conclusions were drawn from the patient's questionnaire, the interview, and the physical examination. Because the information was provided in large part by the patient, it cannot be guaranteed that it has not been purposely or unconsciously manipulated. Every effort has been made to obtain as much relevant data as possible for this evaluation. It is important to note that the conclusions that lead to this procedure are derived in large part from the available data. Always take into account that the treatment will also be dependent on availability of resources and existing treatment guidelines, considered by other Pain Management Practitioners as being common knowledge and practice, at the time of the intervention. For Medico-Legal purposes, it is also important to point out that variation in procedural techniques and pharmacological choices are the acceptable norm. The indications, contraindications, technique, and results of the above procedure should only be interpreted and judged by a Board-Certified Interventional Pain Specialist with extensive familiarity and expertise in the same exact procedure and technique.

## 2017-03-28 NOTE — Progress Notes (Signed)
Nursing Pain Medication Assessment:  Safety precautions to be maintained throughout the outpatient stay will include: orient to surroundings, keep bed in low position, maintain call bell within reach at all times, provide assistance with transfer out of bed and ambulation.  Medication Inspection Compliance: Pill count conducted under aseptic conditions, in front of the patient. Neither the pills nor the bottle was removed from the patient's sight at any time. Once count was completed pills were immediately returned to the patient in their original bottle.  Medication: Oxycodone IR Pill/Patch Count: 0 of 120 pills remain Pill/Patch Appearance: Markings consistent with prescribed medication Bottle Appearance: Standard pharmacy container. Clearly labeled. Filled Date:09/17/ 2018 Last Medication intake:  Today

## 2017-03-28 NOTE — Patient Instructions (Signed)

## 2017-03-29 ENCOUNTER — Telehealth: Payer: Self-pay

## 2017-03-29 NOTE — Telephone Encounter (Signed)
Denies any needs at this time. Instructed to call if needed. 

## 2017-04-01 ENCOUNTER — Encounter: Payer: Self-pay | Admitting: Urology

## 2017-04-01 ENCOUNTER — Ambulatory Visit: Payer: Medicare Other | Admitting: Urology

## 2017-04-15 ENCOUNTER — Other Ambulatory Visit: Payer: Self-pay

## 2017-04-15 ENCOUNTER — Encounter: Payer: Self-pay | Admitting: Emergency Medicine

## 2017-04-15 DIAGNOSIS — E1140 Type 2 diabetes mellitus with diabetic neuropathy, unspecified: Secondary | ICD-10-CM | POA: Diagnosis not present

## 2017-04-15 DIAGNOSIS — I10 Essential (primary) hypertension: Secondary | ICD-10-CM | POA: Diagnosis not present

## 2017-04-15 DIAGNOSIS — Z87891 Personal history of nicotine dependence: Secondary | ICD-10-CM | POA: Insufficient documentation

## 2017-04-15 DIAGNOSIS — Z79899 Other long term (current) drug therapy: Secondary | ICD-10-CM | POA: Diagnosis not present

## 2017-04-15 DIAGNOSIS — Z7984 Long term (current) use of oral hypoglycemic drugs: Secondary | ICD-10-CM | POA: Insufficient documentation

## 2017-04-15 DIAGNOSIS — K429 Umbilical hernia without obstruction or gangrene: Secondary | ICD-10-CM | POA: Diagnosis not present

## 2017-04-15 DIAGNOSIS — E119 Type 2 diabetes mellitus without complications: Secondary | ICD-10-CM | POA: Diagnosis not present

## 2017-04-15 DIAGNOSIS — R1031 Right lower quadrant pain: Secondary | ICD-10-CM | POA: Diagnosis not present

## 2017-04-15 LAB — CBC
HCT: 47.2 % (ref 40.0–52.0)
HEMOGLOBIN: 15.9 g/dL (ref 13.0–18.0)
MCH: 29.6 pg (ref 26.0–34.0)
MCHC: 33.8 g/dL (ref 32.0–36.0)
MCV: 87.5 fL (ref 80.0–100.0)
PLATELETS: 202 10*3/uL (ref 150–440)
RBC: 5.39 MIL/uL (ref 4.40–5.90)
RDW: 14.3 % (ref 11.5–14.5)
WBC: 7.2 10*3/uL (ref 3.8–10.6)

## 2017-04-15 LAB — COMPREHENSIVE METABOLIC PANEL
ALK PHOS: 55 U/L (ref 38–126)
ALT: 34 U/L (ref 17–63)
ANION GAP: 7 (ref 5–15)
AST: 29 U/L (ref 15–41)
Albumin: 4.1 g/dL (ref 3.5–5.0)
BILIRUBIN TOTAL: 0.6 mg/dL (ref 0.3–1.2)
BUN: 11 mg/dL (ref 6–20)
CALCIUM: 8.9 mg/dL (ref 8.9–10.3)
CO2: 24 mmol/L (ref 22–32)
CREATININE: 0.94 mg/dL (ref 0.61–1.24)
Chloride: 106 mmol/L (ref 101–111)
GFR calc non Af Amer: >60 mL/min (ref >60)
GLUCOSE: 140 mg/dL — AB (ref 65–99)
Potassium: 3.6 mmol/L (ref 3.5–5.1)
Sodium: 137 mmol/L (ref 135–145)
TOTAL PROTEIN: 7.2 g/dL (ref 6.5–8.1)

## 2017-04-15 LAB — URINALYSIS, COMPLETE (UACMP) WITH MICROSCOPIC
BILIRUBIN URINE: NEGATIVE
Bacteria, UA: NONE SEEN
GLUCOSE, UA: NEGATIVE mg/dL
Ketones, ur: NEGATIVE mg/dL
Leukocytes, UA: NEGATIVE
NITRITE: NEGATIVE
PROTEIN: NEGATIVE mg/dL
SPECIFIC GRAVITY, URINE: 1.006 (ref 1.005–1.030)
Squamous Epithelial / LPF: NONE SEEN
pH: 6 (ref 5.0–8.0)

## 2017-04-15 LAB — LIPASE, BLOOD: Lipase: 56 U/L — ABNORMAL HIGH (ref 11–51)

## 2017-04-15 MED ORDER — ONDANSETRON 4 MG PO TBDP
4.0000 mg | ORAL_TABLET | Freq: Once | ORAL | Status: AC
  Administered 2017-04-15: 4 mg via ORAL

## 2017-04-15 MED ORDER — OXYCODONE-ACETAMINOPHEN 5-325 MG PO TABS
1.0000 | ORAL_TABLET | ORAL | Status: DC | PRN
  Administered 2017-04-15: 1 via ORAL
  Filled 2017-04-15: qty 1

## 2017-04-15 MED ORDER — ONDANSETRON 4 MG PO TBDP
ORAL_TABLET | ORAL | Status: AC
  Filled 2017-04-15: qty 1

## 2017-04-15 NOTE — ED Triage Notes (Signed)
Patient to ER for c/o right sided groin pain. States pain started a few weeks ago, but has gotten significantly worse today. Patient has h/o kidney stones, but states this pain doesn't feel similar to prior stones. Denies any heavy lifting or labor.

## 2017-04-16 ENCOUNTER — Emergency Department: Payer: Medicare Other

## 2017-04-16 ENCOUNTER — Emergency Department
Admission: EM | Admit: 2017-04-16 | Discharge: 2017-04-16 | Disposition: A | Discharge status: Home / Self Care | Payer: Medicare Other | Attending: Emergency Medicine | Admitting: Emergency Medicine

## 2017-04-16 ENCOUNTER — Encounter: Payer: Self-pay | Admitting: Radiology

## 2017-04-16 DIAGNOSIS — K429 Umbilical hernia without obstruction or gangrene: Secondary | ICD-10-CM | POA: Diagnosis not present

## 2017-04-16 DIAGNOSIS — R1031 Right lower quadrant pain: Secondary | ICD-10-CM

## 2017-04-16 MED ORDER — IOPAMIDOL (ISOVUE-300) INJECTION 61%
100.0000 mL | Freq: Once | INTRAVENOUS | Status: AC | PRN
  Administered 2017-04-16: 100 mL via INTRAVENOUS

## 2017-04-16 MED ORDER — KETOROLAC TROMETHAMINE 30 MG/ML IJ SOLN
30.0000 mg | Freq: Once | INTRAMUSCULAR | Status: AC
  Administered 2017-04-16: 30 mg via INTRAVENOUS
  Filled 2017-04-16: qty 1

## 2017-04-16 MED ORDER — ETODOLAC 200 MG PO CAPS: 200.0000 mg | ORAL_CAPSULE | Freq: Three times a day (TID) | ORAL | 0 refills | Status: DC

## 2017-04-16 NOTE — ED Provider Notes (Signed)
[facility] Emergency Department Provider Note   ____________________________________________   First MD Initiated Contact with Patient 04/16/17 0138     (approximate)  I have reviewed the triage vital signs and the nursing notes.   HISTORY  Chief Complaint Groin Pain    HPI Howard Murphy is a 61 y.o. male who comes into the hospital today with some right-sided groin pain. The patient reports that it's in his right groin and goes all the way down his testicle. He reports that if he lays on his side it's worse but is not worse when he picks things up. He states that this pain isn't going on for months but it became worse over the last few days. He reports that it sharp and stabbing. The patient goes to the pain clinic and takes medication for his chronic pain but he states that that does not help his groin pain. The patient denies any pain with urination or blood in his urine. He reports this pain is currently a 7 out of 10 in intensity. He had some nausea with no vomiting. He denies any swelling to his testicle or pain when he palpates his testicle. The patient is here today for evaluation.   Past Medical History:  Diagnosis Date  . 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  . Back ache 05/05/2013  . Diabetes mellitus without complication ([place])   . GERD (gastroesophageal reflux disease)   . Headache   . Hyperlipemia   . Hypertension   . Kidney stones   . Nephrolithiasis   . Shortness of breath dyspnea     Patient Active Problem List   Diagnosis Date Noted  . Acute postoperative pain 03/28/2017  . Medication monitoring encounter 03/07/2017  . 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 Spondylolisthesis at L3-L4 level 08/03/2016  . Chronic pain syndrome 07/09/2016  . Encounter for medication monitoring 02/24/2016  . Cervical spondylosis 12/17/2015  . Cervical facet arthropathy  12/17/2015  . Chronic neck pain (Bilateral) (R>L) 12/17/2015  . Cervical facet syndrome (Bilateral) (R>L) 12/17/2015  . Chronic upper extremity pain (Bilateral) (R>L) 12/17/2015  . Cervical foraminal stenosis 12/17/2015  .  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 Arthropathy of shoulder (Right) 12/17/2015  . Osteoarthritis 12/17/2015  . 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 Lumbar foraminal stenosis (L4-5) (Left) 12/17/2015  . Grade 1 Anterolisthesis (2 mm) of L4 over L5. 12/17/2015  . Lumbar facet (L4-5) synovial cyst (6 mm) (Left) 12/17/2015  . Chronic sacroiliac joint pain (Bilateral) (L>R) 12/08/2015  . Neurogenic pain 12/08/2015  . Abnormal MRI, lumbar spine 11/21/2015  . Radicular pain of shoulder (Right) 10/03/2015  . Carpal tunnel syndrome (Bilateral) 10/03/2015  . Chronic cervical radicular pain (Right) 10/03/2015  . Chronic low back pain (Primary Source of Pain) (Bilateral) (L>R) 09/06/2015  . Opiate use (60 MME/Day) 09/05/2015  . Long term prescription opiate use 09/05/2015  . Long term current use of opiate analgesic 09/05/2015  . Spondylosis of lumbar spine 09/05/2015  . Lumbar facet joint syndrome ([place]) 09/05/2015  . Chronic hip pain (Left) 09/05/2015  . Diabetic peripheral neuropathy ([place]) 09/05/2015  . Chronic lumbar radicular pain (S1 Dermatome) (Bilateral) (L>R) 09/05/2015  . Hypokalemia 09/05/2015  . Chronic shoulder pain (Primary Source of Pain) (Bilateral) (L>R) 09/05/2015  . Diabetes ([place]) 09/05/2015  . Chronic foot pain (Secondary source of pain) (Bilateral) (L>R) 09/05/2015  . Chronic hand  pain [facility] source of pain) (Bilateral) (R>L) 09/05/2015  . Lumbar facet hypertrophy 09/05/2015  . Encounter for therapeutic drug level monitoring 09/05/2015  . Low testosterone 09/05/2015  . Occipital pain (Right) 09/05/2015  . 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 Hypertriglyceridemia 07/26/2015  . Chronic  abdominal pain (RUQ) 07/14/2015  . Type 2 diabetes mellitus with peripheral neuropathy ([place]) 07/14/2015  . Chest pain 07/14/2015  . Complete tear of the shoulder rotator cuff (Left) 05/10/2015  . Complete tear of left rotator cuff 05/10/2015  . SOB (shortness of breath) 03/22/2015  . Elevated hemoglobin ([place]) 07/07/2013  . History of lumbar facet Synovial cyst, surgically removed. 05/29/2013    Class: History of  . 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 GERD (gastroesophageal reflux disease) 05/05/2013  . Benign prostatic hyperplasia 05/05/2013  . Hypertension 05/08/2011  . Hyperlipidemia 05/08/2011  . RA (rheumatoid arthritis) ([place]) 05/08/2011    Past Surgical History:  Procedure Laterality Date  . BACK SURGERY  2015   x3  . BACK SURGERY  08/09/2016  . CHOLECYSTECTOMY  2000  . SHOULDER SURGERY Right 2016  . SHOULDER SURGERY Left 06/2015    Prior to Admission medications   Medication Sig Start Date End Date Taking? Authorizing Provider  atorvastatin (LIPITOR) 40 MG tablet Take 1 tablet (40 mg total) by mouth at bedtime. 03/08/17   Arnetha Courser, MD  celecoxib (CELEBREX) 200 MG capsule Take 1 capsule (200 mg total) by mouth every 12 (twelve) hours. 08/07/16   Costella, Vista Mink, PA-C  chlorthalidone (HYGROTON) 25 MG tablet Take 1 tablet (25 mg total) by mouth daily. 03/08/17   Arnetha Courser, MD  etodolac (LODINE) 200 MG capsule Take 1 capsule (200 mg total) every 8 (eight) hours by mouth. 04/16/17   Loney Hering, MD  gabapentin (NEURONTIN) 100 MG capsule Take 1 capsule (100 mg total) by mouth at bedtime. 03/07/17   Arnetha Courser, MD  lisinopril (PRINIVIL,ZESTRIL) 20 MG tablet Take 1 tablet (20 mg total) by mouth daily. Reported on 12/08/2015 03/08/17   Arnetha Courser, MD  Oxycodone HCl 10 MG TABS Take 1 tablet (10 mg total) by mouth every 6 (six) hours as needed. 03/28/17 04/27/17  Milinda Pointer, MD  promethazine (PHENERGAN) 25 MG tablet Take 1 tablet (25 mg total) by  mouth every 6 (six) hours as needed for nausea or vomiting. 08/08/16   Harvest Dark, MD  ranitidine (ZANTAC) 75 MG tablet Take 75 mg by mouth daily.    [provider]  sitaGLIPtin (JANUVIA) 50 MG tablet Take 1 tablet (50 mg total) by mouth daily. 03/08/17   Arnetha Courser, MD  testosterone cypionate (DEPOTESTOSTERONE CYPIONATE) 200 MG/ML injection Inject 200 mg into the muscle every 14 (fourteen) days. 05/12/12   [provider]    Allergies Patient has no known allergies.  Family History  Problem Relation Age of Onset  . Cancer Mother        Breast CA  . Stroke Father   . Heart disease Father   . Cancer Sister   . Cancer Maternal Grandmother   . Cancer Sister   . Bladder Cancer Neg Hx   . Prostate cancer Neg Hx   . Kidney cancer Neg Hx     Social History Social History   Tobacco Use  . Smoking status: Never Smoker  . Smokeless tobacco: Former Systems developer    Types: Chew  Substance Use Topics  . Alcohol use: Yes    Comment: occasional  .  Drug use: No    Review of Systems  Constitutional: No fever/chills Eyes: No visual changes. ENT: No sore throat. Cardiovascular: Denies chest pain. Respiratory: Denies shortness of breath. Gastrointestinal:  abdominal pain.  No nausea, no vomiting.  No diarrhea.  No constipation. Genitourinary: Negative for dysuria. Musculoskeletal: Negative for back pain. Skin: Negative for rash. Neurological: Negative for headaches, focal weakness or numbness.   ____________________________________________   PHYSICAL EXAM:  VITAL SIGNS: ED Triage Vitals  Enc Vitals Group     BP 04/15/17 2303 (!) 179/95     Pulse Rate 04/15/17 2303 74     Resp 04/15/17 2303 18     Temp 04/15/17 2303 97.7 F (36.5 C)     Temp Source 04/15/17 2303 Oral     SpO2 04/15/17 2303 97 %     Weight 04/15/17 2303 200 lb (90.7 kg)     Height 04/15/17 2303 5\' 7"  (1.702 m)     Head Circumference --      Peak Flow --      Pain Score 04/15/17 2302  7     Pain Loc --      Pain Edu? --      Excl. in [place]? --     Constitutional: Alert and oriented. Well appearing and in moderate distress. Eyes: Conjunctivae are normal. PERRL. EOMI. Head: Atraumatic. Nose: No congestion/rhinnorhea. Mouth/Throat: Mucous membranes are moist.  Oropharynx non-erythematous. Cardiovascular: Normal rate, regular rhythm. Grossly normal heart sounds.  Good peripheral circulation. Respiratory: Normal respiratory effort.  No retractions. Lungs CTAB. Gastrointestinal: Soft RLQ abd pain and inguinal pain. No distention. Positive bowel sounds Musculoskeletal: No lower extremity tenderness nor edema.  Neurologic:  Normal speech and language.  Skin:  Skin is warm, dry and intact.  Psychiatric: Mood and affect are normal.   ____________________________________________   LABS (all labs ordered are listed, but only abnormal results are displayed)  Labs Reviewed  LIPASE, BLOOD - Abnormal; Notable for the following components:      Result Value   Lipase 56 (*)    All other components within normal limits  COMPREHENSIVE METABOLIC PANEL - Abnormal; Notable for the following components:   Glucose, Bld 140 (*)    All other components within normal limits  URINALYSIS, COMPLETE (UACMP) WITH MICROSCOPIC - Abnormal; Notable for the following components:   Color, Urine STRAW (*)    APPearance CLEAR (*)    Hgb urine dipstick MODERATE (*)    All other components within normal limits  CBC   ____________________________________________  EKG  none ____________________________________________  RADIOLOGY  Ct Abdomen Pelvis W Contrast  Result Date: 04/16/2017 CLINICAL DATA:  Right-sided groin pain starting a few weeks ago. Worse today. EXAM: CT ABDOMEN AND PELVIS WITH CONTRAST TECHNIQUE: Multidetector CT imaging of the abdomen and pelvis was performed using the standard protocol following bolus administration of intravenous contrast. CONTRAST:  137mL ISOVUE-300 IOPAMIDOL  (ISOVUE-300) INJECTION 61% COMPARISON:  12/01/2016 FINDINGS: Lower chest: Lung bases are clear. Hepatobiliary: Diffuse fatty infiltration of the liver. No focal lesions identified. Surgical absence of the gallbladder. No bile duct dilatation. Pancreas: Unremarkable. No pancreatic ductal dilatation or surrounding inflammatory changes. Spleen: Normal in size without focal abnormality. Adrenals/Urinary Tract: No adrenal gland nodules. 6 mm stone in the upper pole right kidney. No hydronephrosis or hydroureter. Renal nephrograms are symmetrical. Bladder is unremarkable. Stomach/Bowel: Stomach is within normal limits. Appendix appears normal. No evidence of bowel wall thickening, distention, or inflammatory changes. Vascular/Lymphatic: Aortic atherosclerosis. No enlarged abdominal or pelvic lymph  nodes. Reproductive: Prostate calcifications. Mild prostate enlargement at 4.3 cm diameter. Other: No free air or free fluid in the abdomen. Small periumbilical hernia containing fat. Fat in the left inguinal canal. Musculoskeletal: Degenerative changes in the spine. No destructive bone lesions. Postoperative posterior fixation and intervertebral disc prosthesis from L3 through L5. IMPRESSION: 1. 6 mm nonobstructing stone in the right kidney. No ureteral stone or obstruction. 2. Diffuse fatty infiltration of the liver. 3. Aortic atherosclerosis. 4. Prostate enlargement. 5. Small periumbilical hernia containing fat. Electronically Signed   By: Lucienne Capers M.D.   On: 04/16/2017 03:14    ____________________________________________   PROCEDURES  Procedure(s) performed: None  Procedures  Critical Care performed: No  ____________________________________________   INITIAL IMPRESSION / ASSESSMENT AND PLAN / ED COURSE  As part of my medical decision making, I reviewed the following data within the electronic MEDICAL RECORD NUMBER Notes from prior ED visits and [place] Controlled Substance Database   This is a  61 year old who comes into the hospital today with some right lower quadrant abdominal pain/groin pain.  My differential diagnosis includes appendicitis, musculoskeletal pain, kidney stone, hernia   We did check some blood work on the patient and his lipase returned elevated but the patient does not have any epigastric pain. I sent him for CT scan to evaluate his abdominal pain. The scan did show a 6 mm nonobstructing stone in the right kidney but no obstruction or ureteral stone. The patient also had no signs of appendicitis or hernia. He has a periumbilical fat-containing hernia but nothing inguinal. I did give the patient a dose of Toradol for his pain. I informed him that I am unsure what causing his symptoms but he should follow-up with his primary care physician for further evaluation of this pain. This pain has been going on for a few months so it may be musculoskeletal in nature. The patient should follow-up with his primary care physician. He has no further questions or concerns at the time. He'll be discharged home.      ____________________________________________   FINAL CLINICAL IMPRESSION(S) / ED DIAGNOSES  Final diagnoses:  Right lower quadrant abdominal pain  Right inguinal pain      Note:  This document was prepared using Dragon voice recognition software and may include unintentional dictation errors.    Loney Hering, MD 04/16/17 [phone]

## 2017-04-16 NOTE — Discharge Instructions (Signed)
Please follow-up with your primary care physician for further evaluation of your groin and abdomen pain.

## 2017-04-30 ENCOUNTER — Ambulatory Visit: Payer: Medicare Other | Attending: Nurse Practitioner | Admitting: Nurse Practitioner

## 2017-04-30 ENCOUNTER — Other Ambulatory Visit: Payer: Self-pay

## 2017-04-30 ENCOUNTER — Encounter: Payer: Self-pay | Admitting: Nurse Practitioner

## 2017-04-30 VITALS — BP 148/94 | HR 84 | Temp 97.8°F | Resp 18 | Ht 66.0 in | Wt 195.0 lb

## 2017-04-30 DIAGNOSIS — M533 Sacrococcygeal disorders, not elsewhere classified: Secondary | ICD-10-CM | POA: Diagnosis not present

## 2017-04-30 DIAGNOSIS — M79672 Pain in left foot: Secondary | ICD-10-CM | POA: Diagnosis not present

## 2017-04-30 DIAGNOSIS — Z79899 Other long term (current) drug therapy: Secondary | ICD-10-CM | POA: Insufficient documentation

## 2017-04-30 DIAGNOSIS — M79671 Pain in right foot: Secondary | ICD-10-CM | POA: Insufficient documentation

## 2017-04-30 DIAGNOSIS — M25512 Pain in left shoulder: Secondary | ICD-10-CM | POA: Insufficient documentation

## 2017-04-30 DIAGNOSIS — Z5181 Encounter for therapeutic drug level monitoring: Secondary | ICD-10-CM | POA: Insufficient documentation

## 2017-04-30 DIAGNOSIS — M542 Cervicalgia: Secondary | ICD-10-CM

## 2017-04-30 DIAGNOSIS — M069 Rheumatoid arthritis, unspecified: Secondary | ICD-10-CM | POA: Diagnosis not present

## 2017-04-30 DIAGNOSIS — Z79891 Long term (current) use of opiate analgesic: Secondary | ICD-10-CM | POA: Diagnosis not present

## 2017-04-30 DIAGNOSIS — M79641 Pain in right hand: Secondary | ICD-10-CM | POA: Diagnosis not present

## 2017-04-30 DIAGNOSIS — K219 Gastro-esophageal reflux disease without esophagitis: Secondary | ICD-10-CM | POA: Diagnosis not present

## 2017-04-30 DIAGNOSIS — M25511 Pain in right shoulder: Secondary | ICD-10-CM | POA: Diagnosis not present

## 2017-04-30 DIAGNOSIS — G8929 Other chronic pain: Secondary | ICD-10-CM | POA: Diagnosis not present

## 2017-04-30 DIAGNOSIS — E1142 Type 2 diabetes mellitus with diabetic polyneuropathy: Secondary | ICD-10-CM | POA: Insufficient documentation

## 2017-04-30 DIAGNOSIS — Z87891 Personal history of nicotine dependence: Secondary | ICD-10-CM | POA: Diagnosis not present

## 2017-04-30 DIAGNOSIS — I10 Essential (primary) hypertension: Secondary | ICD-10-CM | POA: Insufficient documentation

## 2017-04-30 DIAGNOSIS — M79642 Pain in left hand: Secondary | ICD-10-CM | POA: Diagnosis not present

## 2017-04-30 DIAGNOSIS — M545 Low back pain: Secondary | ICD-10-CM

## 2017-04-30 DIAGNOSIS — D291 Benign neoplasm of prostate: Secondary | ICD-10-CM | POA: Diagnosis not present

## 2017-04-30 DIAGNOSIS — M48061 Spinal stenosis, lumbar region without neurogenic claudication: Secondary | ICD-10-CM | POA: Diagnosis not present

## 2017-04-30 DIAGNOSIS — M8938 Hypertrophy of bone, other site: Secondary | ICD-10-CM | POA: Diagnosis not present

## 2017-04-30 DIAGNOSIS — G5603 Carpal tunnel syndrome, bilateral upper limbs: Secondary | ICD-10-CM | POA: Diagnosis not present

## 2017-04-30 DIAGNOSIS — Z87442 Personal history of urinary calculi: Secondary | ICD-10-CM | POA: Insufficient documentation

## 2017-04-30 DIAGNOSIS — G894 Chronic pain syndrome: Secondary | ICD-10-CM | POA: Diagnosis not present

## 2017-04-30 DIAGNOSIS — M25552 Pain in left hip: Secondary | ICD-10-CM | POA: Insufficient documentation

## 2017-04-30 DIAGNOSIS — M47896 Other spondylosis, lumbar region: Secondary | ICD-10-CM

## 2017-04-30 DIAGNOSIS — E785 Hyperlipidemia, unspecified: Secondary | ICD-10-CM | POA: Diagnosis not present

## 2017-04-30 DIAGNOSIS — M47816 Spondylosis without myelopathy or radiculopathy, lumbar region: Secondary | ICD-10-CM

## 2017-04-30 MED ORDER — OXYCODONE HCL 10 MG PO TABS: 10.0000 mg | ORAL_TABLET | Freq: Four times a day (QID) | ORAL | 0 refills | Status: DC | PRN

## 2017-04-30 NOTE — Progress Notes (Signed)
Nursing Pain Medication Assessment:  Safety precautions to be maintained throughout the outpatient stay will include: orient to surroundings, keep bed in low position, maintain call bell within reach at all times, provide assistance with transfer out of bed and ambulation.  Medication Inspection Compliance: Pill count conducted under aseptic conditions, in front of the patient. Neither the pills nor the bottle was removed from the patient's sight at any time. Once count was completed pills were immediately returned to the patient in their original bottle.  Medication #1: Oxycodone IR Pill/Patch Count: 0 of 120 pills remain Pill/Patch Appearance: Markings consistent with prescribed medication Bottle Appearance: Standard pharmacy container. Clearly labeled. Filled Date: 10/18 / 2018 Last Medication intake:  Ran out of medicine more than 48 hours ago  Medication #2: Oxycodone/APAP Pill/Patch Count: 0 of 28 pills remain Pill/Patch Appearance: Markings consistent with prescribed medication Bottle Appearance: Standard pharmacy container. Clearly labeled. Filled Date: 10/18/ 2018 Last Medication intake:  Yesterday  Post RF medication

## 2017-04-30 NOTE — Progress Notes (Signed)
Patient's Name: Howard Murphy  MRN: [id number]  Referring Provider: Arnetha Courser, MD  DOB: July 11, 1955  PCP: Arnetha Courser, MD  DOS: 04/30/2017  Note by: Vevelyn Francois NP  Service setting: Ambulatory outpatient  Specialty: Interventional Pain Management  Location: [facility] (AMB) Pain Management Facility    Patient type: Established    Primary Reason(s) for Visit: Encounter for prescription drug management & post-procedure evaluation of chronic illness with mild to moderate exacerbation(Level of risk: moderate) CC: Back Pain (lower)  HPI  Howard Murphy is a 61 y.o. year old, male patient, who comes today for a post-procedure evaluation and medication management. He has Benign fibroma of prostate; Elevated hemoglobin (HCC); GERD (gastroesophageal reflux disease); Hypertension; Hyperlipidemia; History of lumbar facet Synovial cyst, surgically removed.; RA (rheumatoid arthritis) ([place]); SOB (shortness of breath); Chronic abdominal pain (RUQ); Type 2 diabetes mellitus with peripheral neuropathy ([place]); Chest pain; Hypertriglyceridemia; Opiate use (60 MME/Day); Long term prescription opiate use; Long term current use of opiate analgesic; Spondylosis of lumbar spine; Lumbar facet joint syndrome ([place]); Chronic hip pain (Left); Diabetic peripheral neuropathy ([place]); Chronic lumbar radicular pain (S1 Dermatome) (Bilateral) (L>R); Hypokalemia; Complete tear of the shoulder rotator cuff (Left); Chronic shoulder pain (Primary Source of Pain) (Bilateral) (L>R); Diabetes ([place]); Chronic foot pain (Secondary source of pain) (Bilateral) (L>R); Chronic hand pain [facility] source of pain) (Bilateral) (R>L); Lumbar facet hypertrophy; Encounter for therapeutic drug level monitoring; Low testosterone; Occipital pain (Right); Failed back surgical syndrome 4; Chronic low back pain (Primary Source of Pain) (Bilateral) (L>R); Radicular pain of shoulder (Right); Carpal tunnel syndrome (Bilateral); Chronic cervical radicular pain  (Right); Abnormal MRI, lumbar spine; Chronic sacroiliac joint pain (Bilateral) (L>R); Neurogenic pain; Cervical spondylosis; Cervical facet arthropathy; Chronic neck pain (Bilateral) (R>L); Cervical facet syndrome (Bilateral) (R>L); Chronic upper extremity pain (Bilateral) (R>L); Cervical foraminal stenosis;  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 His primarily concern today is the Back Pain (lower)  Pain Assessment: Location: Left Back Radiating: left leg down to the ankle Onset: More than a month ago Duration: Chronic pain Quality: Aching, Sharp Severity: 4 /10 (self-reported pain score)  Note: Reported level is compatible with observation.                          Effect on ADL:   Timing: Constant Modifying factors: medication  Howard Murphy was last seen on 01/28/2017 for a procedure. During today's appointment we reviewed Howard Murphy's post-procedure results, as well as his outpatient medication regimen. He states that his pain is back. He states that the pain does go down into the ankle with increased standing or walking. He denies any numbness or tingling. He does have weakness with activity greater than 5 minutes. He denies any new concerns or questions.  Further details on both, my assessment(s), as well as the proposed treatment plan, please see below.  Controlled Substance Pharmacotherapy Assessment REMS (Risk Evaluation  and Mitigation Strategy)  Analgesic:Oxycodone IR 10 mg 1 tablet by mouth 4 times a day (40 mg/dayof oxycodone) (60 MME/Day) MME/day:53m/day.  Landis Martins, RN  04/30/2017  9:11 AM  Sign at close encounter Nursing Pain Medication Assessment:  Safety precautions to be maintained throughout the outpatient stay will include: orient to surroundings, keep bed in low position, maintain call bell within reach at all times, provide assistance with transfer out of bed and ambulation.  Medication Inspection Compliance: Pill count conducted under aseptic conditions, in front of the patient. Neither the pills nor the bottle was removed from the patient's sight at any time. Once count was completed pills were immediately returned to the patient in their original bottle.  Medication #1: Oxycodone IR Pill/Patch Count: 0 of 120 pills remain Pill/Patch Appearance: Markings consistent with prescribed medication Bottle Appearance: Standard pharmacy container. Clearly labeled. Filled Date: 10/18 / 2018 Last Medication intake:  Ran out of medicine more than 48 hours ago  Medication #2: Oxycodone/APAP Pill/Patch Count: 0 of 28 pills remain Pill/Patch Appearance: Markings consistent with prescribed medication Bottle Appearance: Standard pharmacy container. Clearly labeled. Filled Date: 10/18/ 2018 Last Medication intake:  Yesterday  Post RF medication   Pharmacokinetics: Liberation and absorption (onset of action): WNL Distribution (time to peak effect): WNL Metabolism and excretion (duration of action): WNL         Pharmacodynamics: Desired effects: Analgesia: Howard Murphy reports >50% benefit. Functional ability: Patient reports that medication allows him to accomplish basic ADLs Clinically meaningful improvement in function (CMIF): Sustained CMIF goals met Perceived effectiveness: Described as relatively effective, allowing for increase in activities of daily living (ADL) Undesirable  effects: Side-effects or Adverse reactions: None reported Monitoring: [place] PMP: Online review of the past 68-monthperiod conducted. Compliant with practice rules and regulations Last UDS on record: Summary  Date Value Ref Range Status  11/29/2016 FINAL  Final    Comment:    ==================================================================== TOXASSURE SELECT 13 (MW) ==================================================================== Test                             Result       Flag       Units Drug Present   Oxycodone                      1718                    ng/mg creat   Oxymorphone                    1595                    ng/mg creat   Noroxycodone                   2663                    ng/mg creat   Noroxymorphone                 589                     ng/mg creat    Sources of oxycodone are scheduled prescription medications.    Oxymorphone, noroxycodone, and noroxymorphone are expected    metabolites of oxycodone. Oxymorphone is also available as a    scheduled prescription medication. ==================================================================== Test                      Result    Flag  Units      Ref Range   Creatinine              188              mg/dL      >=20 ==================================================================== Declared Medications:  Medication list was not provided. ==================================================================== For clinical consultation, please call [phone]. ====================================================================    UDS interpretation: Compliant          Medication Assessment Form: Reviewed. Patient indicates being compliant with therapy Treatment compliance: Compliant Risk Assessment Profile: Aberrant behavior: See prior evaluations. None observed or detected today Comorbid factors increasing risk of overdose: See prior notes. No additional risks detected today Risk of substance use  disorder (SUD): Low  ORT Scoring interpretation table:  Score <3 = Low Risk for SUD  Score between 4-7 = Moderate Risk for SUD  Score >8 = High Risk for Opioid Abuse   Risk Mitigation Strategies:  Patient Counseling: Covered Patient-Prescriber Agreement (PPA): Present and active  Notification to other healthcare providers: Done  Pharmacologic Plan: No change in therapy, at this time  Post-Procedure Assessment  03/28/2017 Procedure: Left sided lumbar RFA Pre-procedure pain score:  4/10 Post-procedure pain score: 0/10         Influential Factors: BMI: 31.47 kg/m Intra-procedural challenges: None observed.         Assessment challenges: None detected.              Reported side-effects: None.        Post-procedural adverse reactions or complications: None reported         Sedation: Please see nurses note. When no sedatives are used, the analgesic levels obtained are directly associated to the effectiveness of the local anesthetics. However, when sedation is provided, the level of analgesia obtained during the initial 1 hour following the intervention, is believed to be the result of a combination of factors. These factors may include, but are not limited to: 1. The effectiveness of the local anesthetics used. 2. The effects of the analgesic(s) and/or anxiolytic(s) used. 3. The degree of discomfort experienced by the patient at the time of the procedure. 4. The patients ability and reliability in recalling and recording the events. 5. The presence and influence of possible secondary gains and/or psychosocial factors. Reported result: Relief experienced during the 1st hour after the procedure: 100 % (Ultra-Short Term Relief)            Interpretative annotation: Clinically appropriate result. Analgesia during this period is likely to be Local Anesthetic and/or IV Sedative (Analgesic/Anxiolytic) related.          Effects of local anesthetic: The analgesic effects attained during this  period are directly associated to the localized infiltration of local anesthetics and therefore cary significant diagnostic value as to the etiological location, or anatomical origin, of the pain. Expected duration of relief is directly dependent on the pharmacodynamics of the local anesthetic used. Long-acting (4-6 hours) anesthetics used.  Reported result: Relief during the next 4 to 6 hour after the procedure: 50 % (Short-Term Relief)            Interpretative annotation: Clinically appropriate result. Analgesia during this period is likely to be Local Anesthetic-related.          Long-term benefit: Defined as the period of time past the expected duration of local anesthetics (1 hour for short-acting and 4-6 hours for long-acting). With the possible exception of prolonged sympathetic blockade from the local anesthetics, benefits during this  period are typically attributed to, or associated with, other factors such as analgesic sensory neuropraxia, antiinflammatory effects, or beneficial biochemical changes provided by agents other than the local anesthetics.  Reported result: Extended relief following procedure: 10 % (Long-Term Relief)            Interpretative annotation: Clinically appropriate result. Good relief. No permanent benefit expected. Inflammation plays a part in the etiology to the pain.          Current benefits: Defined as reported results that persistent at this point in time.   Analgesia: <25 %            Function: Back to baseline ROM: Back to baseline Interpretative annotation: Recurrence of symptoms. Therapeutic failure.             Interpretation: Results would suggest failure of therapy in achieving desired goal(s).                  Plan:  Please see "Plan of Care" for details.        Laboratory Chemistry  Inflammation Markers (CRP: Acute Phase) (ESR: Chronic Phase) Lab Results  Component Value Date   CRP 1.8 11/29/2016   ESRSEDRATE 11 11/29/2016                  Renal Function Markers Lab Results  Component Value Date   BUN 11 04/15/2017   CREATININE 0.94 04/15/2017   GFRAA >60 04/15/2017   GFRNONAA >60 04/15/2017                 Hepatic Function Markers Lab Results  Component Value Date   AST 29 04/15/2017   ALT 34 04/15/2017   ALBUMIN 4.1 04/15/2017   ALKPHOS 55 04/15/2017                 Electrolytes Lab Results  Component Value Date   NA 137 04/15/2017   K 3.6 04/15/2017   CL 106 04/15/2017   CALCIUM 8.9 04/15/2017   MG 2.0 11/29/2016                 Neuropathy Markers Lab Results  Component Value Date   QMVHQION62 952 11/29/2016                 Bone Pathology Markers Lab Results  Component Value Date   ALKPHOS 55 04/15/2017   25OHVITD1 24 (L) 11/29/2016   25OHVITD2 <1.0 11/29/2016   25OHVITD3 24 11/29/2016   CALCIUM 8.9 04/15/2017                 Rheumatology Markers No results found for: LABURIC, URICUR              Coagulation Parameters Lab Results  Component Value Date   PLT 202 04/15/2017                 Cardiovascular Markers Lab Results  Component Value Date   TROPONINI <0.03 02/18/2017   HGB 15.9 04/15/2017   HCT 47.2 04/15/2017                 CA Markers No results found for: CEA, CA125, LABCA2               Note: Lab results reviewed.  Recent Diagnostic Imaging Results  CT Abdomen Pelvis W Contrast CLINICAL DATA:  Right-sided groin pain starting a few weeks ago. Worse today.  EXAM: CT ABDOMEN AND PELVIS WITH CONTRAST  TECHNIQUE: Multidetector CT imaging of the abdomen and  pelvis was performed using the standard protocol following bolus administration of intravenous contrast.  CONTRAST:  170m ISOVUE-300 IOPAMIDOL (ISOVUE-300) INJECTION 61%  COMPARISON:  12/01/2016  FINDINGS: Lower chest: Lung bases are clear.  Hepatobiliary: Diffuse fatty infiltration of the liver. No focal lesions identified. Surgical absence of the gallbladder. No bile duct dilatation.  Pancreas:  Unremarkable. No pancreatic ductal dilatation or surrounding inflammatory changes.  Spleen: Normal in size without focal abnormality.  Adrenals/Urinary Tract: No adrenal gland nodules. 6 mm stone in the upper pole right kidney. No hydronephrosis or hydroureter. Renal nephrograms are symmetrical. Bladder is unremarkable.  Stomach/Bowel: Stomach is within normal limits. Appendix appears normal. No evidence of bowel wall thickening, distention, or inflammatory changes.  Vascular/Lymphatic: Aortic atherosclerosis. No enlarged abdominal or pelvic lymph nodes.  Reproductive: Prostate calcifications. Mild prostate enlargement at 4.3 cm diameter.  Other: No free air or free fluid in the abdomen. Small periumbilical hernia containing fat. Fat in the left inguinal canal.  Musculoskeletal: Degenerative changes in the spine. No destructive bone lesions. Postoperative posterior fixation and intervertebral disc prosthesis from L3 through L5.  IMPRESSION: 1. 6 mm nonobstructing stone in the right kidney. No ureteral stone or obstruction. 2. Diffuse fatty infiltration of the liver. 3. Aortic atherosclerosis. 4. Prostate enlargement. 5. Small periumbilical hernia containing fat.  Electronically Signed   By: WLucienne CapersM.D.   On: 04/16/2017 03:14  Complexity Note: Imaging results reviewed. Results shared with Howard Murphy, using [employer]                         Meds   Current Outpatient Medications:  .  atorvastatin (LIPITOR) 40 MG tablet, Take 1 tablet (40 mg total) by mouth at bedtime., Disp: 30 tablet, Rfl: 5 .  celecoxib (CELEBREX) 200 MG capsule, Take 1 capsule (200 mg total) by mouth every 12 (twelve) hours., Disp: 60 capsule, Rfl: 1 .  chlorthalidone (HYGROTON) 25 MG tablet, Take 1 tablet (25 mg total) by mouth daily., Disp: 30 tablet, Rfl: 5 .  etodolac (LODINE) 200 MG capsule, Take 1 capsule (200 mg total) every 8 (eight) hours by mouth., Disp: 12 capsule, Rfl: 0 .   gabapentin (NEURONTIN) 100 MG capsule, Take 1 capsule (100 mg total) by mouth at bedtime., Disp: 30 capsule, Rfl: 0 .  lisinopril (PRINIVIL,ZESTRIL) 20 MG tablet, Take 1 tablet (20 mg total) by mouth daily. Reported on 12/08/2015, Disp: 30 tablet, Rfl: 5 .  promethazine (PHENERGAN) 25 MG tablet, Take 1 tablet (25 mg total) by mouth every 6 (six) hours as needed for nausea or vomiting., Disp: 20 tablet, Rfl: 0 .  ranitidine (ZANTAC) 75 MG tablet, Take 75 mg by mouth daily., Disp: , Rfl:  .  sitaGLIPtin (JANUVIA) 50 MG tablet, Take 1 tablet (50 mg total) by mouth daily., Disp: 30 tablet, Rfl: 5 .  [START ON 06/29/2017] Oxycodone HCl 10 MG TABS, Take 1 tablet (10 mg total) every 6 (six) hours as needed by mouth., Disp: 120 tablet, Rfl: 0 .  [START ON 05/30/2017] Oxycodone HCl 10 MG TABS, Take 1 tablet (10 mg total) every 6 (six) hours as needed by mouth., Disp: 120 tablet, Rfl: 0 .  Oxycodone HCl 10 MG TABS, Take 1 tablet (10 mg total) every 6 (six) hours as needed by mouth., Disp: 120 tablet, Rfl: 0  ROS  Constitutional: Denies any fever or chills Gastrointestinal: No reported hemesis, hematochezia, vomiting, or acute GI distress Musculoskeletal: Denies any acute onset joint swelling, redness,  loss of ROM, or weakness Neurological: No reported episodes of acute onset apraxia, aphasia, dysarthria, agnosia, amnesia, paralysis, loss of coordination, or loss of consciousness  Allergies  Howard Murphy has No Known Allergies.  [place]  Drug: Howard Murphy  reports that he does not use drugs. Alcohol:  reports that he drinks alcohol. Tobacco:  reports that  has never smoked. He quit smokeless tobacco use about 4 years ago. His smokeless tobacco use included chew. Medical:  has a past medical history of Arthritis, Back ache (05/05/2013), Diabetes mellitus without complication ([place]), GERD (gastroesophageal reflux disease), Headache, Hyperlipemia, Hypertension, Kidney stones, Nephrolithiasis, and Shortness of  breath dyspnea. Surgical: Howard Murphy  has a past surgical history that includes Cholecystectomy (2000); Shoulder surgery (Right, 2016); Colonoscopy with propofol (N/A, 04/11/2015); Esophagogastroduodenoscopy (egd) with propofol (N/A, 04/11/2015); Shoulder surgery (Left, 06/2015); Back surgery (2015); and Back surgery (08/09/2016). Family: family history includes Cancer in his maternal grandmother, mother, sister, and sister; Heart disease in his father; Stroke in his father.  Constitutional Exam  General appearance: Well nourished, well developed, and well hydrated. In no apparent acute distress Vitals:   04/30/17 0906  BP: (!) 148/94  Pulse: 84  Resp: 18  Temp: 97.8 F (36.6 C)  TempSrc: Oral  SpO2: 98%  Weight: 195 lb (88.5 kg)  Height: 5' 6" (1.676 m)  Psych/Mental status: Alert, oriented x 3 (person, place, & time)       Eyes: PERLA Respiratory: No evidence of acute respiratory distress  Cervical Spine Area Exam  Skin & Axial Inspection: No masses, redness, edema, swelling, or associated skin lesions Alignment: Symmetrical Functional ROM: Unrestricted ROM      Stability: No instability detected Muscle Tone/Strength: Functionally intact. No obvious neuro-muscular anomalies detected. Sensory (Neurological): Unimpaired Palpation: No palpable anomalies              Upper Extremity (UE) Exam    Side: Right upper extremity  Side: Left upper extremity  Skin & Extremity Inspection: Skin color, temperature, and hair growth are WNL. No peripheral edema or cyanosis. No masses, redness, swelling, asymmetry, or associated skin lesions. No contractures.  Skin & Extremity Inspection: Skin color, temperature, and hair growth are WNL. No peripheral edema or cyanosis. No masses, redness, swelling, asymmetry, or associated skin lesions. No contractures.  Functional ROM: Unrestricted ROM          Functional ROM: Unrestricted ROM          Muscle Tone/Strength: Functionally intact. No obvious  neuro-muscular anomalies detected.  Muscle Tone/Strength: Functionally intact. No obvious neuro-muscular anomalies detected.  Sensory (Neurological): Unimpaired          Sensory (Neurological): Unimpaired          Palpation: No palpable anomalies              Palpation: No palpable anomalies              Specialized Test(s): Deferred         Specialized Test(s): Deferred          Thoracic Spine Area Exam  Skin & Axial Inspection: No masses, redness, or swelling Alignment: Symmetrical Functional ROM: Unrestricted ROM Stability: No instability detected Muscle Tone/Strength: Functionally intact. No obvious neuro-muscular anomalies detected. Sensory (Neurological): Unimpaired Muscle strength & Tone: No palpable anomalies  Lumbar Spine Area Exam  Skin & Axial Inspection: No masses, redness, or swelling Alignment: Symmetrical Functional ROM: Unrestricted ROM      Stability: No instability detected Muscle Tone/Strength: Functionally intact. No obvious  neuro-muscular anomalies detected. Sensory (Neurological): Unimpaired Palpation: Complains of area being tender to palpation       Provocative Tests: Lumbar Hyperextension and rotation test: evaluation deferred today       Lumbar Lateral bending test: evaluation deferred today       Patrick's Maneuver: evaluation deferred today                    Gait & Posture Assessment  Ambulation: Unassisted Gait: Relatively normal for age and body habitus Posture: WNL   Lower Extremity Exam    Side: Right lower extremity  Side: Left lower extremity  Skin & Extremity Inspection: Skin color, temperature, and hair growth are WNL. No peripheral edema or cyanosis. No masses, redness, swelling, asymmetry, or associated skin lesions. No contractures.  Skin & Extremity Inspection: Skin color, temperature, and hair growth are WNL. No peripheral edema or cyanosis. No masses, redness, swelling, asymmetry, or associated skin lesions. No contractures.  Functional  ROM: Unrestricted ROM          Functional ROM: Unrestricted ROM          Muscle Tone/Strength: Functionally intact. No obvious neuro-muscular anomalies detected.  Muscle Tone/Strength: Functionally intact. No obvious neuro-muscular anomalies detected.  Sensory (Neurological): Unimpaired  Sensory (Neurological): Unimpaired  Palpation: No palpable anomalies  Palpation: No palpable anomalies   Assessment  Primary Diagnosis & Pertinent Problem List: The primary encounter diagnosis was Long term current use of opiate analgesic. Diagnoses of Chronic low back pain (Primary Source of Pain) (Bilateral) (L>R), Lumbar facet hypertrophy, Chronic neck pain (Bilateral) (R>L), and Chronic pain syndrome were also pertinent to this visit.  Status Diagnosis  Controlled Controlled Controlled 1. Long term current use of opiate analgesic   2. Chronic low back pain (Primary Source of Pain) (Bilateral) (L>R)   3. Lumbar facet hypertrophy   4. Chronic neck pain (Bilateral) (R>L)   5. Chronic pain syndrome     Problems updated and reviewed during this visit: No problems updated. Plan of Care  Pharmacotherapy (Medications Ordered): Meds ordered this encounter  Medications  . Oxycodone HCl 10 MG TABS    Sig: Take 1 tablet (10 mg total) every 6 (six) hours as needed by mouth.    Dispense:  120 tablet    Refill:  0    Do not place this medication, or any other prescription from our practice, on "Automatic Refill". Patient may have prescription filled one day early if pharmacy is closed on scheduled refill date. Do not fill until: 06/29/2017 To last until: 07/29/2017    Order Specific Question:   Supervising Provider    Answer:   Milinda Pointer [phone]  . Oxycodone HCl 10 MG TABS    Sig: Take 1 tablet (10 mg total) every 6 (six) hours as needed by mouth.    Dispense:  120 tablet    Refill:  0    Do not place this medication, or any other prescription from our practice, on "Automatic Refill". Patient may  have prescription filled one day early if pharmacy is closed on scheduled refill date. Do not fill until:05/30/2017 To last until: 06/29/2017    Order Specific Question:   Supervising Provider    Answer:   Milinda Pointer [phone]  . Oxycodone HCl 10 MG TABS    Sig: Take 1 tablet (10 mg total) every 6 (six) hours as needed by mouth.    Dispense:  120 tablet    Refill:  0    Do not  place this medication, or any other prescription from our practice, on "Automatic Refill". Patient may have prescription filled one day early if pharmacy is closed on scheduled refill date. Do not fill until: 04/30/2017 To last until: 05/30/2017    Order Specific Question:   Supervising Provider    Answer:   Milinda Pointer [phone]  This SmartLink is deprecated. Use AVSMEDLIST instead to display the medication list for a patient. Medications administered today: Howard Murphy had no medications administered during this visit. Lab-work, procedure(s), and/or referral(s): Orders Placed This Encounter  Procedures  . ToxASSURE Select 13 (MW), Urine   Imaging and/or referral(s): None  Interventional therapies: Planned, scheduled, and/or pending:   Not at this time.   Considering:  Diagnostic bilateral cervical facet block Possible bilateral cervical facet radiofrequency ablation  Diagnostic right-sided greater occipital nerve block Possible right-sided greater occipital nerve radiofrequencyablation  Possible right-sided peripheral nerve stimulator trial  Diagnostic right-sided cervical epidural steroid injection  Diagnostic bilateral intra-articular shoulder injection Diagnostic bilateral suprascapular nerve block Possible bilateral suprascapular nerve radiofrequencyablation  Diagnostic bilateral sacroiliac joint block Possible bilateral sacroiliac joint radiofrequencyablation  Diagnostic bilateral lumbar facet block Possible bilateral lumbar facet radiofrequencyablation  Diagnostic  left L4-5 transforaminal epiduralsteroid injection  Diagnostic left-sided L4-5 lumbar epidural steroid injection  Diagnostic left intra-articular hip joint injection Possible left hip joint radiofrequencyablation  Diagnostic bilateral median nerve block(carpal tunnel block)  Diagnostic right-sided celiac plexus block   Palliative PRN treatment(s):  None at this time.   Provider-requested follow-up: Return in about 3 months (around 07/31/2017) for MedMgmt.  Future Appointments  Date Time Provider [place]  05/09/2017  1:30 PM Vevelyn Francois, NP [facility] None  07/29/2017  9:15 AM Vevelyn Francois, NP [facility] None   Primary Care Physician: Arnetha Courser, MD Location: [facility] Note by: Vevelyn Francois NP Date: 04/30/2017; Time: 3:35 PM  Pain Score Disclaimer: We use the NRS-11 scale. This is a self-reported, subjective measurement of pain severity with only modest accuracy. It is used primarily to identify changes within a particular patient. It must be understood that outpatient pain scales are significantly less accurate that those used for research, where they can be applied under ideal controlled circumstances with minimal exposure to variables. In reality, the score is likely to be a combination of pain intensity and pain affect, where pain affect describes the degree of emotional arousal or changes in action readiness caused by the sensory experience of pain. Factors such as social and work situation, setting, emotional state, anxiety levels, expectation, and prior pain experience may influence pain perception and show large inter-individual differences that may also be affected by time variables.  Patient instructions provided during this appointment: Patient Instructions    ____________________________________________________________________________________________  Medication Rules  Applies to: All patients receiving  prescriptions (written or electronic).  Pharmacy of record: Pharmacy where electronic prescriptions will be sent. If written prescriptions are taken to a different pharmacy, please inform the nursing staff. The pharmacy listed in the electronic medical record should be the one where you would like electronic prescriptions to be sent.  Prescription refills: Only during scheduled appointments. Applies to both, written and electronic prescriptions.  NOTE: The following applies primarily to controlled substances (Opioid* Pain Medications).   Patient's responsibilities: 1. Pain Pills: Bring all pain pills to every appointment (except for procedure appointments). 2. Pill Bottles: Bring pills in original pharmacy bottle. Always bring newest bottle. Bring bottle, even if empty. 3. Medication refills: You are responsible  for knowing and keeping track of what medications you need refilled. The day before your appointment, write a list of all prescriptions that need to be refilled. Bring that list to your appointment and give it to the admitting nurse. Prescriptions will be written only during appointments. If you forget a medication, it will not be "Called in", "Faxed", or "electronically sent". You will need to get another appointment to get these prescribed. 4. Prescription Accuracy: You are responsible for carefully inspecting your prescriptions before leaving our office. Have the discharge nurse carefully go over each prescription with you, before taking them home. Make sure that your name is accurately spelled, that your address is correct. Check the name and dose of your medication to make sure it is accurate. Check the number of pills, and the written instructions to make sure they are clear and accurate. Make sure that you are given enough medication to last until your next medication refill appointment. 5. Taking Medication: Take medication as prescribed. Never take more pills than instructed. Never take  medication more frequently than prescribed. Taking less pills or less frequently is permitted and encouraged, when it comes to controlled substances (written prescriptions).  6. Inform other Doctors: Always inform, all of your healthcare providers, of all the medications you take. 7. Pain Medication from other Providers: You are not allowed to accept any additional pain medication from any other Doctor or Healthcare provider. There are two exceptions to this rule. (see below) In the event that you require additional pain medication, you are responsible for notifying us, as stated below. 8. Medication Agreement: You are responsible for carefully reading and following our Medication Agreement. This must be signed before receiving any prescriptions from our practice. Safely store a copy of your signed Agreement. Violations to the Agreement will result in no further prescriptions. (Additional copies of our Medication Agreement are available upon request.) 9. Laws, Rules, & Regulations: All patients are expected to follow all Federal and [employer], Rules, [employer]. Ignorance of the Laws does not constitute a valid excuse. The use of any illegal substances is prohibited. 10. Adopted CDC guidelines & recommendations: Target dosing levels will be at or below 60 MME/day. Use of benzodiazepines** is not recommended.  Exceptions: There are only two exceptions to the rule of not receiving pain medications from other Healthcare Providers. 1. Exception #1 (Emergencies): In the event of an emergency (i.e.: accident requiring emergency care), you are allowed to receive additional pain medication. However, you are responsible for: As soon as you are able, call our office [phone], at any time of the day or night, and leave a message stating your name, the date and nature of the emergency, and the name and dose of the medication prescribed. In the event that your call is answered by a member of our  staff, make sure to document and save the date, time, and the name of the person that took your information.  2. Exception #2 (Planned Surgery): In the event that you are scheduled by another doctor or dentist to have any type of surgery or procedure, you are allowed (for a period no longer than 30 days), to receive additional pain medication, for the acute post-op pain. However, in this case, you are responsible for picking up a copy of our "Post-op Pain Management for Surgeons" handout, and giving it to your surgeon or dentist. This document is available at our office, and does not require an appointment to obtain it. Simply go to  our office during business hours (Monday-Thursday from 8:00 AM to 4:00 PM) (Friday 8:00 AM to 12:00 Noon) or if you have a scheduled appointment with [place], prior to your surgery, and ask for it by name. In addition, you will need to provide [place] with your name, name of your surgeon, type of surgery, and date of procedure or surgery.  *Opioid medications include: morphine, codeine, oxycodone, oxymorphone, hydrocodone, hydromorphone, meperidine, tramadol, tapentadol, buprenorphine, fentanyl, methadone. **Benzodiazepine medications include: diazepam (Valium), alprazolam (Xanax), clonazepam (Klonopine), lorazepam (Ativan), clorazepate (Tranxene), chlordiazepoxide (Librium), estazolam (Prosom), oxazepam (Serax), temazepam (Restoril), triazolam (Halcion)  ____________________________________________________________________________________________  BMI Assessment: Estimated body mass index is 31.47 kg/m as calculated from the following:   Height as of this encounter: 5' 6" (1.676 m).   Weight as of this encounter: 195 lb (88.5 kg).  BMI interpretation table: BMI level Category Range association with higher incidence of chronic pain  <18 kg/m2 Underweight   18.5-24.9 kg/m2 Ideal body weight   25-29.9 kg/m2 Overweight Increased incidence by 20%  30-34.9 kg/m2 Obese (Class I)  Increased incidence by 68%  35-39.9 kg/m2 Severe obesity (Class II) Increased incidence by 136%  >40 kg/m2 Extreme obesity (Class III) Increased incidence by 254%   BMI Readings from Last 4 Encounters:  04/30/17 31.47 kg/m  04/15/17 31.32 kg/m  03/28/17 31.32 kg/m  03/07/17 30.59 kg/m   Wt Readings from Last 4 Encounters:  04/30/17 195 lb (88.5 kg)  04/15/17 200 lb (90.7 kg)  03/28/17 200 lb (90.7 kg)  03/07/17 195 lb 4.8 oz (88.6 kg)

## 2017-04-30 NOTE — Patient Instructions (Addendum)
____________________________________________________________________________________________  Medication Rules  Applies to: All patients receiving prescriptions (written or electronic).  Pharmacy of record: Pharmacy where electronic prescriptions will be sent. If written prescriptions are taken to a different pharmacy, please inform the nursing staff. The pharmacy listed in the electronic medical record should be the one where you would like electronic prescriptions to be sent.  Prescription refills: Only during scheduled appointments. Applies to both, written and electronic prescriptions.  NOTE: The following applies primarily to controlled substances (Opioid* Pain Medications).   Patient's responsibilities: 1. Pain Pills: Bring all pain pills to every appointment (except for procedure appointments). 2. Pill Bottles: Bring pills in original pharmacy bottle. Always bring newest bottle. Bring bottle, even if empty. 3. Medication refills: You are responsible for knowing and keeping track of what medications you need refilled. The day before your appointment, write a list of all prescriptions that need to be refilled. Bring that list to your appointment and give it to the admitting nurse. Prescriptions will be written only during appointments. If you forget a medication, it will not be "Called in", "Faxed", or "electronically sent". You will need to get another appointment to get these prescribed. 4. Prescription Accuracy: You are responsible for carefully inspecting your prescriptions before leaving our office. Have the discharge nurse carefully go over each prescription with you, before taking them home. Make sure that your name is accurately spelled, that your address is correct. Check the name and dose of your medication to make sure it is accurate. Check the number of pills, and the written instructions to make sure they are clear and accurate. Make sure that you are given enough medication to  last until your next medication refill appointment. 5. Taking Medication: Take medication as prescribed. Never take more pills than instructed. Never take medication more frequently than prescribed. Taking less pills or less frequently is permitted and encouraged, when it comes to controlled substances (written prescriptions).  6. Inform other Doctors: Always inform, all of your healthcare providers, of all the medications you take. 7. Pain Medication from other Providers: You are not allowed to accept any additional pain medication from any other Doctor or Healthcare provider. There are two exceptions to this rule. (see below) In the event that you require additional pain medication, you are responsible for notifying us, as stated below. 8. Medication Agreement: You are responsible for carefully reading and following our Medication Agreement. This must be signed before receiving any prescriptions from our practice. Safely store a copy of your signed Agreement. Violations to the Agreement will result in no further prescriptions. (Additional copies of our Medication Agreement are available upon request.) 9. Laws, Rules, & Regulations: All patients are expected to follow all Federal and State Laws, Statutes, Rules, & Regulations. Ignorance of the Laws does not constitute a valid excuse. The use of any illegal substances is prohibited. 10. Adopted CDC guidelines & recommendations: Target dosing levels will be at or below 60 MME/day. Use of benzodiazepines** is not recommended.  Exceptions: There are only two exceptions to the rule of not receiving pain medications from other Healthcare Providers. 1. Exception #1 (Emergencies): In the event of an emergency (i.e.: accident requiring emergency care), you are allowed to receive additional pain medication. However, you are responsible for: As soon as you are able, call our office [phone], at any time of the day or night, and leave a message stating your  name, the date and nature of the emergency, and the name and dose of the medication   prescribed. In the event that your call is answered by a member of our staff, make sure to document and save the date, time, and the name of the person that took your information.  2. Exception #2 (Planned Surgery): In the event that you are scheduled by another doctor or dentist to have any type of surgery or procedure, you are allowed (for a period no longer than 30 days), to receive additional pain medication, for the acute post-op pain. However, in this case, you are responsible for picking up a copy of our "Post-op Pain Management for Surgeons" handout, and giving it to your surgeon or dentist. This document is available at our office, and does not require an appointment to obtain it. Simply go to our office during business hours (Monday-Thursday from 8:00 AM to 4:00 PM) (Friday 8:00 AM to 12:00 Noon) or if you have a scheduled appointment with [place], prior to your surgery, and ask for it by name. In addition, you will need to provide [place] with your name, name of your surgeon, type of surgery, and date of procedure or surgery.  *Opioid medications include: morphine, codeine, oxycodone, oxymorphone, hydrocodone, hydromorphone, meperidine, tramadol, tapentadol, buprenorphine, fentanyl, methadone. **Benzodiazepine medications include: diazepam (Valium), alprazolam (Xanax), clonazepam (Klonopine), lorazepam (Ativan), clorazepate (Tranxene), chlordiazepoxide (Librium), estazolam (Prosom), oxazepam (Serax), temazepam (Restoril), triazolam (Halcion)  ____________________________________________________________________________________________  BMI Assessment: Estimated body mass index is 31.47 kg/m as calculated from the following:   Height as of this encounter: 5\' 6"  (1.676 m).   Weight as of this encounter: 195 lb (88.5 kg).  BMI interpretation table: BMI level Category Range association with higher incidence of chronic pain   <18 kg/m2 Underweight   18.5-24.9 kg/m2 Ideal body weight   25-29.9 kg/m2 Overweight Increased incidence by 20%  30-34.9 kg/m2 Obese (Class I) Increased incidence by 68%  35-39.9 kg/m2 Severe obesity (Class II) Increased incidence by 136%  >40 kg/m2 Extreme obesity (Class III) Increased incidence by 254%   BMI Readings from Last 4 Encounters:  04/30/17 31.47 kg/m  04/15/17 31.32 kg/m  03/28/17 31.32 kg/m  03/07/17 30.59 kg/m   Wt Readings from Last 4 Encounters:  04/30/17 195 lb (88.5 kg)  04/15/17 200 lb (90.7 kg)  03/28/17 200 lb (90.7 kg)  03/07/17 195 lb 4.8 oz (88.6 kg)

## 2017-05-06 LAB — TOXASSURE SELECT 13 (MW), URINE

## 2017-05-09 ENCOUNTER — Ambulatory Visit: Payer: Medicare Other | Admitting: Nurse Practitioner

## 2017-05-22 ENCOUNTER — Telehealth: Payer: Self-pay

## 2017-05-22 NOTE — Telephone Encounter (Signed)
Called pt inquired about him taking lisinopril 20mg  as we received a notification from [facility] there that were gaps in the pt picking up medication from pharmacy. Pt states that he is taking medication daily as prescribed.

## 2017-05-29 ENCOUNTER — Emergency Department
Admission: EM | Admit: 2017-05-29 | Discharge: 2017-05-29 | Disposition: A | Discharge status: Home / Self Care | Payer: Medicare Other | Attending: Emergency Medicine | Admitting: Emergency Medicine

## 2017-05-29 ENCOUNTER — Other Ambulatory Visit: Payer: Self-pay

## 2017-05-29 ENCOUNTER — Encounter: Payer: Self-pay | Admitting: Emergency Medicine

## 2017-05-29 ENCOUNTER — Emergency Department: Payer: Medicare Other

## 2017-05-29 DIAGNOSIS — J209 Acute bronchitis, unspecified: Secondary | ICD-10-CM | POA: Diagnosis not present

## 2017-05-29 DIAGNOSIS — Z87891 Personal history of nicotine dependence: Secondary | ICD-10-CM | POA: Insufficient documentation

## 2017-05-29 DIAGNOSIS — E119 Type 2 diabetes mellitus without complications: Secondary | ICD-10-CM | POA: Insufficient documentation

## 2017-05-29 DIAGNOSIS — Z79899 Other long term (current) drug therapy: Secondary | ICD-10-CM | POA: Diagnosis not present

## 2017-05-29 DIAGNOSIS — G8929 Other chronic pain: Secondary | ICD-10-CM | POA: Diagnosis not present

## 2017-05-29 DIAGNOSIS — I10 Essential (primary) hypertension: Secondary | ICD-10-CM | POA: Diagnosis not present

## 2017-05-29 DIAGNOSIS — Z7984 Long term (current) use of oral hypoglycemic drugs: Secondary | ICD-10-CM | POA: Insufficient documentation

## 2017-05-29 DIAGNOSIS — R05 Cough: Secondary | ICD-10-CM | POA: Diagnosis not present

## 2017-05-29 MED ORDER — ALBUTEROL SULFATE HFA 108 (90 BASE) MCG/ACT IN AERS: 2.0000 | INHALATION_SPRAY | Freq: Four times a day (QID) | RESPIRATORY_TRACT | 2 refills | Status: DC | PRN

## 2017-05-29 MED ORDER — AZITHROMYCIN 250 MG PO TABS: ORAL_TABLET | ORAL | 0 refills | Status: DC

## 2017-05-29 MED ORDER — METHYLPREDNISOLONE 4 MG PO TBPK: ORAL_TABLET | ORAL | 0 refills | Status: DC

## 2017-05-29 MED ORDER — IPRATROPIUM-ALBUTEROL 0.5-2.5 (3) MG/3ML IN SOLN
3.0000 mL | Freq: Once | RESPIRATORY_TRACT | Status: AC
  Administered 2017-05-29: 3 mL via RESPIRATORY_TRACT
  Filled 2017-05-29: qty 3

## 2017-05-29 NOTE — Discharge Instructions (Signed)
Follow-up with your regular doctor at the end of this week for recheck, go earlier if you are not better in 3-5 days, return to the emergency department if your worsening, use all the medication as prescribed, drink plenty of fluids and take Tylenol or Advil for fever as needed, if you have any questions or concerns please return to the emergency department or call your regular doctor

## 2017-05-29 NOTE — ED Provider Notes (Signed)
[facility] Emergency Department Provider Note  ____________________________________________   First MD Initiated Contact with Patient 05/29/17 1336     (approximate)  I have reviewed the triage vital signs and the nursing notes.   HISTORY  Chief Complaint Sore Throat; Cough; and Nausea    HPI Howard Murphy is a 62 y.o. male complains of cough and congestion with some shortness of breath, states that he has had a fever for 2 days, the cough for 2 weeks, he denies swelling in the ankles or extremities, states last time he had so much trouble breathing he almost called EMS, he denies cardiac type chest pain, he denies abdominal pain, vomiting or diarrhea  Past Medical History:  Diagnosis Date  . 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  . Back ache 05/05/2013  . Diabetes mellitus without complication ([place])   . GERD (gastroesophageal reflux disease)   . Headache   . Hyperlipemia   . Hypertension   . Kidney stones   . Nephrolithiasis   . Shortness of breath dyspnea     Patient Active Problem List   Diagnosis Date Noted  . Acute postoperative pain 03/28/2017  . Medication monitoring encounter 03/07/2017  . 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 Spondylolisthesis at L3-L4 level 08/03/2016  . Chronic pain syndrome 07/09/2016  . Encounter for medication monitoring 02/24/2016  . Cervical spondylosis 12/17/2015  . Cervical facet arthropathy 12/17/2015  . Chronic neck pain (Bilateral) (R>L) 12/17/2015  . Cervical facet syndrome (Bilateral) (R>L) 12/17/2015  . Chronic upper extremity pain (Bilateral) (R>L) 12/17/2015  . Cervical foraminal stenosis 12/17/2015  .  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 Arthropathy of shoulder (Right) 12/17/2015  . Osteoarthritis 12/17/2015  . 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 Lumbar foraminal  stenosis (L4-5) (Left) 12/17/2015  . Grade 1 Anterolisthesis (2 mm) of L4 over L5. 12/17/2015  . Lumbar facet (L4-5) synovial cyst (6 mm) (Left) 12/17/2015  . Chronic sacroiliac joint pain (Bilateral) (L>R) 12/08/2015  . Neurogenic pain 12/08/2015  . Abnormal MRI, lumbar spine 11/21/2015  . Radicular pain of shoulder (Right) 10/03/2015  . Carpal tunnel syndrome (Bilateral) 10/03/2015  . Chronic cervical radicular pain (Right) 10/03/2015  . Chronic low back pain (Primary Source of Pain) (Bilateral) (L>R) 09/06/2015  . Opiate use (60 MME/Day) 09/05/2015  . Long term prescription opiate use 09/05/2015  . Long term current use of opiate analgesic 09/05/2015  . Spondylosis of lumbar spine 09/05/2015  . Lumbar facet joint syndrome ([place]) 09/05/2015  . Chronic hip pain (Left) 09/05/2015  . Diabetic peripheral neuropathy ([place]) 09/05/2015  . Chronic lumbar radicular pain (S1 Dermatome) (Bilateral) (L>R) 09/05/2015  . Hypokalemia 09/05/2015  . Chronic shoulder pain (Primary Source of Pain) (Bilateral) (L>R) 09/05/2015  . Diabetes ([place]) 09/05/2015  . Chronic foot pain (Secondary source of pain) (Bilateral) (L>R) 09/05/2015  . Chronic hand pain [facility] source of pain) (Bilateral) (R>L) 09/05/2015  . Lumbar facet hypertrophy 09/05/2015  . Encounter for therapeutic drug level monitoring 09/05/2015  . Low testosterone 09/05/2015  . Occipital pain (Right) 09/05/2015  . 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 Hypertriglyceridemia 07/26/2015  . Chronic abdominal pain (RUQ) 07/14/2015  . Type 2 diabetes mellitus with peripheral neuropathy ([place]) 07/14/2015  . Chest pain 07/14/2015  . Complete tear of the shoulder rotator cuff (Left) 05/10/2015  . Complete tear of left rotator cuff 05/10/2015  . SOB (  shortness of breath) 03/22/2015  . Elevated hemoglobin ([place]) 07/07/2013  . History of lumbar facet Synovial cyst, surgically removed. 05/29/2013    Class: History of  . Benign fibroma of  prostate 05/05/2013  . GERD (gastroesophageal reflux disease) 05/05/2013  . Benign prostatic hyperplasia 05/05/2013  . Hypertension 05/08/2011  . Hyperlipidemia 05/08/2011  . RA (rheumatoid arthritis) ([place]) 05/08/2011    Past Surgical History:  Procedure Laterality Date  . BACK SURGERY  2015   x3  . BACK SURGERY  08/09/2016  . CHOLECYSTECTOMY  2000  . COLONOSCOPY WITH PROPOFOL N/A 04/11/2015   Procedure: COLONOSCOPY WITH PROPOFOL;  Surgeon: Manya Silvas, MD;  Location[facility] ENDOSCOPY;  Service: Endoscopy;  Laterality: N/A;  . ESOPHAGOGASTRODUODENOSCOPY (EGD) WITH PROPOFOL N/A 04/11/2015   Procedure: ESOPHAGOGASTRODUODENOSCOPY (EGD) WITH PROPOFOL;  Surgeon: Manya Silvas, MD;  Location: [facility] ENDOSCOPY;  Service: Endoscopy;  Laterality: N/A;  . SHOULDER SURGERY Right 2016  . SHOULDER SURGERY Left 06/2015    Prior to Admission medications   Medication Sig Start Date End Date Taking? Authorizing Provider  atorvastatin (LIPITOR) 40 MG tablet Take 1 tablet (40 mg total) by mouth at bedtime. 03/08/17   Arnetha Courser, MD  celecoxib (CELEBREX) 200 MG capsule Take 1 capsule (200 mg total) by mouth every 12 (twelve) hours. 08/07/16   Costella, Vista Mink, PA-C  chlorthalidone (HYGROTON) 25 MG tablet Take 1 tablet (25 mg total) by mouth daily. 03/08/17   Arnetha Courser, MD  etodolac (LODINE) 200 MG capsule Take 1 capsule (200 mg total) every 8 (eight) hours by mouth. 04/16/17   Loney Hering, MD  gabapentin (NEURONTIN) 100 MG capsule Take 1 capsule (100 mg total) by mouth at bedtime. 03/07/17   Arnetha Courser, MD  lisinopril (PRINIVIL,ZESTRIL) 20 MG tablet Take 1 tablet (20 mg total) by mouth daily. Reported on 12/08/2015 03/08/17   Arnetha Courser, MD  Oxycodone HCl 10 MG TABS Take 1 tablet (10 mg total) every 6 (six) hours as needed by mouth. 06/29/17 07/29/17  Vevelyn Francois, NP  Oxycodone HCl 10 MG TABS Take 1 tablet (10 mg total) every 6 (six) hours as needed by mouth. 05/30/17 06/29/17   Vevelyn Francois, NP  Oxycodone HCl 10 MG TABS Take 1 tablet (10 mg total) every 6 (six) hours as needed by mouth. 04/30/17 05/30/17  Vevelyn Francois, NP  promethazine (PHENERGAN) 25 MG tablet Take 1 tablet (25 mg total) by mouth every 6 (six) hours as needed for nausea or vomiting. 08/08/16   Harvest Dark, MD  ranitidine (ZANTAC) 75 MG tablet Take 75 mg by mouth daily.    [provider]  sitaGLIPtin (JANUVIA) 50 MG tablet Take 1 tablet (50 mg total) by mouth daily. 03/08/17   Arnetha Courser, MD    Allergies Patient has no known allergies.  Family History  Problem Relation Age of Onset  . Cancer Mother        Breast CA  . Stroke Father   . Heart disease Father   . Cancer Sister   . Cancer Maternal Grandmother   . Cancer Sister   . Bladder Cancer Neg Hx   . Prostate cancer Neg Hx   . Kidney cancer Neg Hx     Social History Social History   Tobacco Use  . Smoking status: Never Smoker  . Smokeless tobacco: Former Systems developer    Types: Chew  Substance Use Topics  . Alcohol use: Yes    Comment: occasional  .  Drug use: No    Review of Systems  Constitutional: Positive fever/chills Eyes: No visual changes. ENT: No sore throat. Respiratory: Positive cough Genitourinary: Negative for dysuria. Musculoskeletal: Negative for back pain. Skin: Negative for rash.    ____________________________________________   PHYSICAL EXAM:  VITAL SIGNS: ED Triage Vitals  Enc Vitals Group     BP 05/29/17 1234 (!) 149/101     Pulse Rate 05/29/17 1234 84     Resp 05/29/17 1234 18     Temp 05/29/17 1234 99.1 F (37.3 C)     Temp Source 05/29/17 1234 Oral     SpO2 05/29/17 1234 96 %     Weight 05/29/17 1235 200 lb (90.7 kg)     Height 05/29/17 1235 5\' 7"  (1.702 m)     Head Circumference --      Peak Flow --      Pain Score 05/29/17 1240 6     Pain Loc --      Pain Edu? --      Excl. in [place]? --     Constitutional: Alert and oriented. Well appearing and in no acute  distress. Eyes: Conjunctivae are normal.  Head: Atraumatic. Nose: No congestion/rhinnorhea. Mouth/Throat: Mucous membranes are moist.  Throat is normal Cardiovascular: Normal rate, regular rhythm.  Heart sounds are normal Respiratory: Normal respiratory effort.  No retractions, lungs with wheezing and decreased air movement throughout all lung fields GU: deferred Musculoskeletal: FROM all extremities, warm and well perfused Neurologic:  Normal speech and language.  Skin:  Skin is clammy and intact. No rash noted.  Psychiatric: Mood and affect are normal. Speech and behavior are normal.  ____________________________________________   LABS (all labs ordered are listed, but only abnormal results are displayed)  Labs Reviewed - No data to display ____________________________________________   ____________________________________________  RADIOLOGY  Chest x-ray is negative  ____________________________________________   PROCEDURES  Procedure(s) performed: DuoNeb      ____________________________________________   INITIAL IMPRESSION / ASSESSMENT AND PLAN / ED COURSE  Pertinent labs & imaging results that were available during my care of the patient were reviewed by me and considered in my medical decision making (see chart for details).  Patient is a 61 year old with cough and fever for 2 weeks, states a fever started about 2-3 days ago, and the cough is gotten worse, DuoNeb and chest x-ray were ordered    ----------------------------------------- 3:42 PM on 05/29/2017 -----------------------------------------  Chest x-ray is negative, patient has clear lungs after the DuoNeb, his diagnosis is acute bronchitis, he was given a prescription for a Z-Pak, Medrol Dosepak and an albuterol inhaler, he is to return to the emergency department if he is worsening, he is to follow-up with his doctor by the end of the week, patient states he understands and will comply with the  treatment plan, he was discharged in stable condition  ____________________________________________   FINAL CLINICAL IMPRESSION(S) / ED DIAGNOSES  Final diagnoses:  None      NEW MEDICATIONS STARTED DURING THIS VISIT:  This SmartLink is deprecated. Use AVSMEDLIST instead to display the medication list for a patient.   Note:  This document was prepared using Dragon voice recognition software and may include unintentional dictation errors.    Versie Starks, PA-C 05/29/17 1543    Earleen Newport, MD 05/29/17 [phone]

## 2017-05-29 NOTE — ED Triage Notes (Signed)
Pt c/o sore throat, cough, nausea, and HA x 2 weeks. Pt states productive cough at this time. Pt is alert and oriented.

## 2017-06-11 HISTORY — PX: BACK SURGERY: SHX140

## 2017-06-14 DIAGNOSIS — Z1211 Encounter for screening for malignant neoplasm of colon: Secondary | ICD-10-CM | POA: Diagnosis not present

## 2017-06-14 DIAGNOSIS — E291 Testicular hypofunction: Secondary | ICD-10-CM | POA: Diagnosis not present

## 2017-06-14 DIAGNOSIS — H9190 Unspecified hearing loss, unspecified ear: Secondary | ICD-10-CM | POA: Diagnosis not present

## 2017-06-14 DIAGNOSIS — I10 Essential (primary) hypertension: Secondary | ICD-10-CM | POA: Diagnosis not present

## 2017-06-14 DIAGNOSIS — N400 Enlarged prostate without lower urinary tract symptoms: Secondary | ICD-10-CM | POA: Diagnosis not present

## 2017-06-14 DIAGNOSIS — H8110 Benign paroxysmal vertigo, unspecified ear: Secondary | ICD-10-CM | POA: Diagnosis not present

## 2017-06-14 DIAGNOSIS — E1165 Type 2 diabetes mellitus with hyperglycemia: Secondary | ICD-10-CM | POA: Diagnosis not present

## 2017-06-14 DIAGNOSIS — E785 Hyperlipidemia, unspecified: Secondary | ICD-10-CM | POA: Diagnosis not present

## 2017-06-14 DIAGNOSIS — R7309 Other abnormal glucose: Secondary | ICD-10-CM | POA: Diagnosis not present

## 2017-06-17 DIAGNOSIS — E291 Testicular hypofunction: Secondary | ICD-10-CM | POA: Diagnosis not present

## 2017-07-29 ENCOUNTER — Ambulatory Visit: Payer: Medicare Other | Attending: Nurse Practitioner | Admitting: Nurse Practitioner

## 2017-07-29 ENCOUNTER — Other Ambulatory Visit: Payer: Self-pay

## 2017-07-29 ENCOUNTER — Encounter: Payer: Self-pay | Admitting: Nurse Practitioner

## 2017-07-29 VITALS — BP 125/86 | HR 80 | Temp 98.0°F | Ht 67.0 in | Wt 195.0 lb

## 2017-07-29 DIAGNOSIS — M545 Low back pain: Secondary | ICD-10-CM | POA: Insufficient documentation

## 2017-07-29 DIAGNOSIS — G5603 Carpal tunnel syndrome, bilateral upper limbs: Secondary | ICD-10-CM | POA: Diagnosis not present

## 2017-07-29 DIAGNOSIS — N400 Enlarged prostate without lower urinary tract symptoms: Secondary | ICD-10-CM | POA: Diagnosis not present

## 2017-07-29 DIAGNOSIS — Z87891 Personal history of nicotine dependence: Secondary | ICD-10-CM | POA: Diagnosis not present

## 2017-07-29 DIAGNOSIS — M069 Rheumatoid arthritis, unspecified: Secondary | ICD-10-CM | POA: Insufficient documentation

## 2017-07-29 DIAGNOSIS — M25512 Pain in left shoulder: Secondary | ICD-10-CM

## 2017-07-29 DIAGNOSIS — M47816 Spondylosis without myelopathy or radiculopathy, lumbar region: Secondary | ICD-10-CM | POA: Diagnosis not present

## 2017-07-29 DIAGNOSIS — M19012 Primary osteoarthritis, left shoulder: Secondary | ICD-10-CM | POA: Insufficient documentation

## 2017-07-29 DIAGNOSIS — G894 Chronic pain syndrome: Secondary | ICD-10-CM | POA: Diagnosis not present

## 2017-07-29 DIAGNOSIS — E1142 Type 2 diabetes mellitus with diabetic polyneuropathy: Secondary | ICD-10-CM | POA: Diagnosis not present

## 2017-07-29 DIAGNOSIS — M25552 Pain in left hip: Secondary | ICD-10-CM | POA: Insufficient documentation

## 2017-07-29 DIAGNOSIS — M25511 Pain in right shoulder: Secondary | ICD-10-CM | POA: Diagnosis not present

## 2017-07-29 DIAGNOSIS — M19011 Primary osteoarthritis, right shoulder: Secondary | ICD-10-CM | POA: Insufficient documentation

## 2017-07-29 DIAGNOSIS — M48061 Spinal stenosis, lumbar region without neurogenic claudication: Secondary | ICD-10-CM | POA: Insufficient documentation

## 2017-07-29 DIAGNOSIS — Z5181 Encounter for therapeutic drug level monitoring: Secondary | ICD-10-CM | POA: Diagnosis not present

## 2017-07-29 DIAGNOSIS — Z79899 Other long term (current) drug therapy: Secondary | ICD-10-CM | POA: Diagnosis not present

## 2017-07-29 DIAGNOSIS — M4316 Spondylolisthesis, lumbar region: Secondary | ICD-10-CM | POA: Diagnosis not present

## 2017-07-29 DIAGNOSIS — K219 Gastro-esophageal reflux disease without esophagitis: Secondary | ICD-10-CM | POA: Diagnosis not present

## 2017-07-29 DIAGNOSIS — M4726 Other spondylosis with radiculopathy, lumbar region: Secondary | ICD-10-CM | POA: Diagnosis not present

## 2017-07-29 DIAGNOSIS — E785 Hyperlipidemia, unspecified: Secondary | ICD-10-CM | POA: Insufficient documentation

## 2017-07-29 DIAGNOSIS — M5416 Radiculopathy, lumbar region: Secondary | ICD-10-CM | POA: Diagnosis not present

## 2017-07-29 DIAGNOSIS — G8929 Other chronic pain: Secondary | ICD-10-CM

## 2017-07-29 DIAGNOSIS — I10 Essential (primary) hypertension: Secondary | ICD-10-CM | POA: Diagnosis not present

## 2017-07-29 DIAGNOSIS — Z79891 Long term (current) use of opiate analgesic: Secondary | ICD-10-CM | POA: Insufficient documentation

## 2017-07-29 MED ORDER — OXYCODONE HCL 10 MG PO TABS: 10.0000 mg | ORAL_TABLET | Freq: Four times a day (QID) | ORAL | 0 refills | Status: DC | PRN

## 2017-07-29 NOTE — Progress Notes (Signed)
Patient's Name: Howard Murphy  MRN: [id number]  Referring Provider: Arnetha Courser, MD  DOB: March 11, 1956  PCP: Arnetha Courser, MD  DOS: 07/29/2017  Note by: Vevelyn Francois NP  Service setting: Ambulatory outpatient  Specialty: Interventional Pain Management  Location: [facility] (AMB) Pain Management Facility    Patient type: Established    Primary Reason(s) for Visit: Encounter for prescription drug management. (Level of risk: moderate)  CC: Back Pain (Lower back)  HPI  Howard Murphy is a 62 y.o. year old, male patient, who comes today for a medication management evaluation. He has Benign fibroma of prostate; Elevated hemoglobin (HCC); GERD (gastroesophageal reflux disease); Hypertension; Hyperlipidemia; History of lumbar facet Synovial cyst, surgically removed.; RA (rheumatoid arthritis) ([place]); SOB (shortness of breath); Chronic abdominal pain (RUQ); Type 2 diabetes mellitus with peripheral neuropathy ([place]); Chest pain; Hypertriglyceridemia; Opiate use (60 MME/Day); Long term prescription opiate use; Long term current use of opiate analgesic; Spondylosis of lumbar spine; Lumbar facet joint syndrome ([place]); Chronic hip pain (Left); Diabetic peripheral neuropathy ([place]); Chronic lumbar radicular pain (S1 Dermatome) (Bilateral) (L>R); Hypokalemia; Complete tear of the shoulder rotator cuff (Left); Chronic shoulder pain (Primary Source of Pain) (Bilateral) (L>R); Diabetes ([place]); Chronic foot pain (Secondary source of pain) (Bilateral) (L>R); Chronic hand pain [facility] source of pain) (Bilateral) (R>L); Lumbar facet hypertrophy; Encounter for therapeutic drug level monitoring; Low testosterone; Occipital pain (Right); Failed back surgical syndrome 4; Chronic low back pain (Primary Source of Pain) (Bilateral) (L>R); Radicular pain of shoulder (Right); Carpal tunnel syndrome (Bilateral); Chronic cervical radicular pain (Right); Abnormal MRI, lumbar spine; Chronic sacroiliac joint pain (Bilateral) (L>R); Neurogenic  pain; Cervical spondylosis; Cervical facet arthropathy; Chronic neck pain (Bilateral) (R>L); Cervical facet syndrome (Bilateral) (R>L); Chronic upper extremity pain (Bilateral) (R>L); Cervical foraminal stenosis;  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 His primarily concern today is the Back Pain (Lower back)  Pain Assessment: Location: Lower Back Radiating: Radiates down both leg to feet Onset: More than a month ago Duration: Chronic pain Quality: Aching, Constant, Discomfort Severity: 5 /10 (self-reported pain score)  Note: Reported level is compatible with observation.                          Timing: Constant Modifying factors: Denies  Howard Murphy was last scheduled for an appointment on 04/30/2017 for medication management. During today's appointment we reviewed Howard Murphy's chronic pain status, as well as his outpatient medication regimen. He has numbness and tingling with weakness in his lower extremities. He denies any falls. He denies any changes in his pain. He admits that he did have shoulder pain. However after his shoulder injection this has offered continual relief to his shoulders.  He denies any side effects of his medication.   The patient  reports that he does not use drugs. His body mass index is 30.54 kg/m.  Further details on both, my assessment(s), as well as the proposed treatment plan, please see  below.  Controlled Substance Pharmacotherapy Assessment REMS (Risk Evaluation and Mitigation Strategy)  Analgesic:Oxycodone IR  10 mg 1 tablet by mouth 4 times a day (40 mg/dayof oxycodone) (60 MME/Day) MME/day:27m/day.   Howard Fischer RN  07/29/2017 10:21 AM  Sign at close encounter Nursing Pain Medication Assessment:  Safety precautions to be maintained throughout the outpatient stay will include: orient to surroundings, keep bed in low position, maintain call bell within reach at all times, provide assistance with transfer out of bed and ambulation.  Medication Inspection Compliance: Pill count conducted under aseptic conditions, in front of the patient. Neither the pills nor the bottle was removed from the patient's sight at any time. Once count was completed pills were immediately returned to the patient in their original bottle.  Medication: Oxycodone IR Pill/Patch Count: 0 of 120 pills remain Pill/Patch Appearance: Markings consistent with prescribed medication Bottle Appearance: Standard pharmacy container. Clearly labeled. Filled Date: 1 / 182/ 2019 Last Medication intake:  Yesterday   Pharmacokinetics: Liberation and absorption (onset of action): WNL Distribution (time to peak effect): WNL Metabolism and excretion (duration of action): WNL         Pharmacodynamics: Desired effects: Analgesia: Mr. FBarskyreports >50% benefit. Functional ability: Patient reports that medication allows him to accomplish basic ADLs Clinically meaningful improvement in function (CMIF): Sustained CMIF goals met Perceived effectiveness: Described as relatively effective, allowing for increase in activities of daily living (ADL) Undesirable effects: Side-effects or Adverse reactions: None reported Monitoring: [place] PMP: Online review of the past 174-montheriod conducted. Compliant with practice rules and regulations Last UDS on record: Summary  Date Value Ref Range Status  04/30/2017  FINAL  Final    Comment:    ==================================================================== TOXASSURE SELECT 13 (MW) ==================================================================== Test                             Result       Flag       Units Drug Present and Declared for Prescription Verification   Oxycodone                      721          EXPECTED   ng/mg creat   Oxymorphone                    264          EXPECTED   ng/mg creat   Noroxycodone                   1079         EXPECTED   ng/mg creat   Noroxymorphone                 131          EXPECTED   ng/mg creat    Sources of oxycodone are scheduled prescription medications.    Oxymorphone, noroxycodone, and noroxymorphone are expected    metabolites of oxycodone. Oxymorphone is also available as a    scheduled prescription medication. ==================================================================== Test                      Result    Flag   Units      Ref Range   Creatinine              109              mg/dL      >=20 ==================================================================== Declared Medications:  The flagging  and interpretation on this report are based on the  following declared medications.  Unexpected results may arise from  inaccuracies in the declared medications.  **Note: The testing scope of this panel includes these medications:  Oxycodone  **Note: The testing scope of this panel does not include following  reported medications:  Atorvastatin (Lipitor)  Celecoxib (Celebrex)  Chlorthalidone  Etodolac  Gabapentin  Lisinopril  Promethazine  Ranitidine (Zantac)  Sitagliptin (Januvia) ==================================================================== For clinical consultation, please call [phone]. ====================================================================    UDS interpretation: Compliant          Medication Assessment Form: Reviewed. Patient indicates being compliant  with therapy Treatment compliance: Compliant Risk Assessment Profile: Aberrant behavior: See prior evaluations. None observed or detected today Comorbid factors increasing risk of overdose: See prior notes. No additional risks detected today Risk of substance use disorder (SUD): Low  ORT Scoring interpretation table:  Score <3 = Low Risk for SUD  Score between 4-7 = Moderate Risk for SUD  Score >8 = High Risk for Opioid Abuse   Risk Mitigation Strategies:  Patient Counseling: Covered Patient-Prescriber Agreement (PPA): Present and active  Notification to other healthcare providers: Done  Pharmacologic Plan: No change in therapy, at this time.             Laboratory Chemistry  Inflammation Markers (CRP: Acute Phase) (ESR: Chronic Phase) Lab Results  Component Value Date   CRP 1.8 11/29/2016   ESRSEDRATE 11 11/29/2016                         Rheumatology Markers No results found for: Elayne Guerin, [facility]              Renal Function Markers Lab Results  Component Value Date   BUN 11 04/15/2017   CREATININE 0.94 04/15/2017   GFRAA >60 04/15/2017   GFRNONAA >60 04/15/2017                 Hepatic Function Markers Lab Results  Component Value Date   AST 29 04/15/2017   ALT 34 04/15/2017   ALBUMIN 4.1 04/15/2017   ALKPHOS 55 04/15/2017   LIPASE 56 (H) 04/15/2017                 Electrolytes Lab Results  Component Value Date   NA 137 04/15/2017   K 3.6 04/15/2017   CL 106 04/15/2017   CALCIUM 8.9 04/15/2017   MG 2.0 11/29/2016                        Neuropathy Markers Lab Results  Component Value Date   VITAMINB12 574 11/29/2016   HGBA1C 6.9 (H) 03/07/2017                 Bone Pathology Markers Lab Results  Component Value Date   25OHVITD1 24 (L) 11/29/2016   25OHVITD2 <1.0 11/29/2016   25OHVITD3 24 11/29/2016                         Coagulation Parameters Lab Results  Component Value Date   PLT 202 04/15/2017                  Cardiovascular Markers Lab Results  Component Value Date   TROPONINI <0.03 02/18/2017   HGB 15.9 04/15/2017   HCT 47.2 04/15/2017  CA Markers No results found for: CEA, CA125, LABCA2               Note: Lab results reviewed.  Recent Diagnostic Imaging Results  DG Chest 2 View CLINICAL DATA:  Cough for 2 weeks.  Headache.  EXAM: CHEST  2 VIEW  COMPARISON:  02/18/2017  FINDINGS: There is elevation of the right diaphragm. There is no focal parenchymal opacity. There is no pleural effusion or pneumothorax. The heart and mediastinal contours are unremarkable.  The osseous structures are unremarkable.  IMPRESSION: No active cardiopulmonary disease.  Electronically Signed   By: Kathreen Devoid   On: 05/29/2017 14:26  Complexity Note: Imaging results reviewed. Results shared with Howard Murphy, using [employer].                         Meds   Current Outpatient Medications:  .  albuterol (PROVENTIL HFA;VENTOLIN HFA) 108 (90 Base) MCG/ACT inhaler, Inhale 2 puffs into the lungs every 6 (six) hours as needed for wheezing or shortness of breath., Disp: 1 Inhaler, Rfl: 2 .  atorvastatin (LIPITOR) 40 MG tablet, Take 1 tablet (40 mg total) by mouth at bedtime., Disp: 30 tablet, Rfl: 5 .  celecoxib (CELEBREX) 200 MG capsule, Take 1 capsule (200 mg total) by mouth every 12 (twelve) hours., Disp: 60 capsule, Rfl: 1 .  chlorthalidone (HYGROTON) 25 MG tablet, Take 1 tablet (25 mg total) by mouth daily., Disp: 30 tablet, Rfl: 5 .  etodolac (LODINE) 200 MG capsule, Take 1 capsule (200 mg total) every 8 (eight) hours by mouth., Disp: 12 capsule, Rfl: 0 .  gabapentin (NEURONTIN) 100 MG capsule, Take 1 capsule (100 mg total) by mouth at bedtime., Disp: 30 capsule, Rfl: 0 .  lisinopril (PRINIVIL,ZESTRIL) 20 MG tablet, Take 1 tablet (20 mg total) by mouth daily. Reported on 12/08/2015, Disp: 30 tablet, Rfl: 5 .  methylPREDNISolone (MEDROL DOSEPAK) 4 MG TBPK tablet, Take  6 pills on day one then decrease by 1 pill each day, Disp: 21 tablet, Rfl: 0 .  [START ON 09/27/2017] Oxycodone HCl 10 MG TABS, Take 1 tablet (10 mg total) by mouth every 6 (six) hours as needed., Disp: 120 tablet, Rfl: 0 .  ranitidine (ZANTAC) 75 MG tablet, Take 75 mg by mouth daily., Disp: , Rfl:  .  sitaGLIPtin (JANUVIA) 50 MG tablet, Take 1 tablet (50 mg total) by mouth daily., Disp: 30 tablet, Rfl: 5 .  [START ON 08/28/2017] Oxycodone HCl 10 MG TABS, Take 1 tablet (10 mg total) by mouth every 6 (six) hours as needed., Disp: 120 tablet, Rfl: 0 .  Oxycodone HCl 10 MG TABS, Take 1 tablet (10 mg total) by mouth every 6 (six) hours as needed., Disp: 120 tablet, Rfl: 0  ROS  Constitutional: Denies any fever or chills Gastrointestinal: No reported hemesis, hematochezia, vomiting, or acute GI distress Musculoskeletal: Denies any acute onset joint swelling, redness, loss of ROM, or weakness Neurological: No reported episodes of acute onset apraxia, aphasia, dysarthria, agnosia, amnesia, paralysis, loss of coordination, or loss of consciousness  Allergies  Howard Murphy has No Known Allergies.  [place]  Drug: Howard Murphy  reports that he does not use drugs. Alcohol:  reports that he drinks alcohol. Tobacco:  reports that  has never smoked. He quit smokeless tobacco use about 4 years ago. His smokeless tobacco use included chew. Medical:  has a past medical history of Arthritis, Back ache (05/05/2013), Diabetes mellitus without complication ([place]), GERD (  gastroesophageal reflux disease), Headache, Hyperlipemia, Hypertension, Kidney stones, Nephrolithiasis, and Shortness of breath dyspnea. Surgical: Howard Murphy  has a past surgical history that includes Cholecystectomy (2000); Shoulder surgery (Right, 2016); Colonoscopy with propofol (N/A, 04/11/2015); Esophagogastroduodenoscopy (egd) with propofol (N/A, 04/11/2015); Shoulder surgery (Left, 06/2015); Back surgery (2015); and Back surgery (08/09/2016). Family:  family history includes Cancer in his maternal grandmother, mother, sister, and sister; Heart disease in his father; Stroke in his father.  Constitutional Exam  General appearance: Well nourished, well developed, and well hydrated. In no apparent acute distress Vitals:   07/29/17 1007  BP: 125/86  Pulse: 80  Temp: 98 F (36.7 C)  SpO2: 97%  Weight: 195 lb (88.5 kg)  Height: _0  (1.702 m)  Psych/Mental status: Alert, oriented x 3 (person, place, & time)       Eyes: PERLA Respiratory: No evidence of acute respiratory distress  Cervical Spine Area Exam  Skin & Axial Inspection: No masses, redness, edema, swelling, or associated skin lesions Alignment: Symmetrical Functional ROM: Unrestricted ROM      Stability: No instability detected Muscle Tone/Strength: Functionally intact. No obvious neuro-muscular anomalies detected. Sensory (Neurological): Unimpaired Palpation: No palpable anomalies              Upper Extremity (UE) Exam    Side: Right upper extremity  Side: Left upper extremity  Skin & Extremity Inspection: Skin color, temperature, and hair growth are WNL. No peripheral edema or cyanosis. No masses, redness, swelling, asymmetry, or associated skin lesions. No contractures.  Skin & Extremity Inspection: Skin color, temperature, and hair growth are WNL. No peripheral edema or cyanosis. No masses, redness, swelling, asymmetry, or associated skin lesions. No contractures.  Functional ROM: Unrestricted ROM          Functional ROM: Unrestricted ROM          Muscle Tone/Strength: Functionally intact. No obvious neuro-muscular anomalies detected.  Muscle Tone/Strength: Functionally intact. No obvious neuro-muscular anomalies detected.  Sensory (Neurological): Unimpaired          Sensory (Neurological): Unimpaired          Palpation: No palpable anomalies              Palpation: No palpable anomalies              Specialized Test(s): Deferred         Specialized Test(s): Deferred           Thoracic Spine Area Exam  Skin & Axial Inspection: No masses, redness, or swelling Alignment: Symmetrical Functional ROM: Unrestricted ROM Stability: No instability detected Muscle Tone/Strength: Functionally intact. No obvious neuro-muscular anomalies detected. Sensory (Neurological): Unimpaired Muscle strength & Tone: No palpable anomalies  Lumbar Spine Area Exam  Skin & Axial Inspection: No masses, redness, or swelling Alignment: Symmetrical Functional ROM: Unrestricted ROM      Stability: No instability detected Muscle Tone/Strength: Functionally intact. No obvious neuro-muscular anomalies detected. Sensory (Neurological): Unimpaired Palpation: Complains of area being tender to palpation       Provocative Tests: Lumbar Hyperextension and rotation test: evaluation deferred today       Lumbar Lateral bending test: evaluation deferred today       Patrick's Maneuver: evaluation deferred today                    Gait & Posture Assessment  Ambulation: Unassisted Gait: Relatively normal for age and body habitus Posture: WNL   Lower Extremity Exam    Side: Right lower extremity  Side: Left lower extremity  Skin & Extremity Inspection: Skin color, temperature, and hair growth are WNL. No peripheral edema or cyanosis. No masses, redness, swelling, asymmetry, or associated skin lesions. No contractures.  Skin & Extremity Inspection: Skin color, temperature, and hair growth are WNL. No peripheral edema or cyanosis. No masses, redness, swelling, asymmetry, or associated skin lesions. No contractures.  Functional ROM: Unrestricted ROM          Functional ROM: Unrestricted ROM          Muscle Tone/Strength: Functionally intact. No obvious neuro-muscular anomalies detected.  Muscle Tone/Strength: Functionally intact. No obvious neuro-muscular anomalies detected.  Sensory (Neurological): Unimpaired  Sensory (Neurological): Unimpaired  Palpation: No palpable anomalies  Palpation: No palpable  anomalies   Assessment  Primary Diagnosis & Pertinent Problem List: The primary encounter diagnosis was Spondylosis of lumbar spine. Diagnoses of Chronic lumbar radicular pain (S1 Dermatome) (Bilateral) (L>R), Chronic shoulder pain (Primary Source of Pain) (Bilateral) (L>R), Chronic pain syndrome, and Long term current use of opiate analgesic were also pertinent to this visit.  Status Diagnosis  Controlled Controlled Controlled 1. Spondylosis of lumbar spine   2. Chronic lumbar radicular pain (S1 Dermatome) (Bilateral) (L>R)   3. Chronic shoulder pain (Primary Source of Pain) (Bilateral) (L>R)   4. Chronic pain syndrome   5. Long term current use of opiate analgesic     Problems updated and reviewed during this visit: No problems updated. Plan of Care  Pharmacotherapy (Medications Ordered): Meds ordered this encounter  Medications  . Oxycodone HCl 10 MG TABS    Sig: Take 1 tablet (10 mg total) by mouth every 6 (six) hours as needed.    Dispense:  120 tablet    Refill:  0    Do not place this medication, or any other prescription from our practice, on "Automatic Refill". Patient may have prescription filled one day early if pharmacy is closed on scheduled refill date. Do not fill until:09/27/2017 To last until: 10/27/2017    Order Specific Question:   Supervising Provider    Answer:   Milinda Pointer [phone]  . Oxycodone HCl 10 MG TABS    Sig: Take 1 tablet (10 mg total) by mouth every 6 (six) hours as needed.    Dispense:  120 tablet    Refill:  0    Do not place this medication, or any other prescription from our practice, on "Automatic Refill". Patient may have prescription filled one day early if pharmacy is closed on scheduled refill date. Do not fill until:08/28/2017 To last until: 09/27/2017    Order Specific Question:   Supervising Provider    Answer:   Milinda Pointer [phone]  . Oxycodone HCl 10 MG TABS    Sig: Take 1 tablet (10 mg total) by mouth every 6 (six)  hours as needed.    Dispense:  120 tablet    Refill:  0    Do not place this medication, or any other prescription from our practice, on "Automatic Refill". Patient may have prescription filled one day early if pharmacy is closed on scheduled refill date. Do not fill until: 07/29/2017 To last until: 08/28/2017    Order Specific Question:   Supervising Provider    Answer:   Milinda Pointer [phone]   New Prescriptions   No medications on file   Medications administered today: Kalei Mckillop. [place] had no medications administered during this visit. Lab-work, procedure(s), and/or referral(s): Orders Placed This Encounter  Procedures  . ToxASSURE Select 13 (MW),  Urine   Imaging and/or referral(s): None  Interventional therapies: Planned, scheduled, and/or pending: Not at this time.   Considering:  Diagnostic bilateral cervical facet block Possible bilateral cervical facet radiofrequency ablation  Diagnostic right-sided greater occipital nerve block Possible right-sided greater occipital nerve radiofrequencyablation  Possible right-sided peripheral nerve stimulator trial  Diagnostic right-sided cervical epidural steroid injection  Diagnostic bilateral intra-articular shoulder injection Diagnostic bilateral suprascapular nerve block Possible bilateral suprascapular nerve radiofrequencyablation  Diagnostic bilateral sacroiliac joint block Possible bilateral sacroiliac joint radiofrequencyablation  Diagnostic bilateral lumbar facet block Possible bilateral lumbar facet radiofrequencyablation  Diagnostic left L4-5 transforaminal epiduralsteroid injection  Diagnostic left-sided L4-5 lumbar epidural steroid injection  Diagnostic left intra-articular hip joint injection Possible left hip joint radiofrequencyablation  Diagnostic bilateral median nerve block(carpal tunnel block)  Diagnostic right-sided celiac plexus block   Palliative PRN treatment(s):  None at this  time   Provider-requested follow-up: Return in about 3 months (around 10/26/2017) for MedMgmt with Me Donella Stade Edison Pace).  Future Appointments  Date Time Provider [place]  10/23/2017  8:45 AM Vevelyn Francois, NP [facility] None   Primary Care Physician: Arnetha Courser, MD Location: [facility] Note by: Vevelyn Francois NP Date: 07/29/2017; Time: 12:59 PM  Pain Score Disclaimer: We use the NRS-11 scale. This is a self-reported, subjective measurement of pain severity with only modest accuracy. It is used primarily to identify changes within a particular patient. It must be understood that outpatient pain scales are significantly less accurate that those used for research, where they can be applied under ideal controlled circumstances with minimal exposure to variables. In reality, the score is likely to be a combination of pain intensity and pain affect, where pain affect describes the degree of emotional arousal or changes in action readiness caused by the sensory experience of pain. Factors such as social and work situation, setting, emotional state, anxiety levels, expectation, and prior pain experience may influence pain perception and show large inter-individual differences that may also be affected by time variables.  Patient instructions provided during this appointment: Patient Instructions    ____________________________________________________________________________________________  Medication Rules  Applies to: All patients receiving prescriptions (written or electronic).  Pharmacy of record: Pharmacy where electronic prescriptions will be sent. If written prescriptions are taken to a different pharmacy, please inform the nursing staff. The pharmacy listed in the electronic medical record should be the one where you would like electronic prescriptions to be sent.  Prescription refills: Only during scheduled appointments. Applies to both, written  and electronic prescriptions.  NOTE: The following applies primarily to controlled substances (Opioid* Pain Medications).   Patient's responsibilities: 1. Pain Pills: Bring all pain pills to every appointment (except for procedure appointments). 2. Pill Bottles: Bring pills in original pharmacy bottle. Always bring newest bottle. Bring bottle, even if empty. 3. Medication refills: You are responsible for knowing and keeping track of what medications you need refilled. The day before your appointment, write a list of all prescriptions that need to be refilled. Bring that list to your appointment and give it to the admitting nurse. Prescriptions will be written only during appointments. If you forget a medication, it will not be "Called in", "Faxed", or "electronically sent". You will need to get another appointment to get these prescribed. 4. Prescription Accuracy: You are responsible for carefully inspecting your prescriptions before leaving our office. Have the discharge nurse carefully go over each prescription with you, before taking them home. Make sure that your name is accurately spelled,  that your address is correct. Check the name and dose of your medication to make sure it is accurate. Check the number of pills, and the written instructions to make sure they are clear and accurate. Make sure that you are given enough medication to last until your next medication refill appointment. 5. Taking Medication: Take medication as prescribed. Never take more pills than instructed. Never take medication more frequently than prescribed. Taking less pills or less frequently is permitted and encouraged, when it comes to controlled substances (written prescriptions).  6. Inform other Doctors: Always inform, all of your healthcare providers, of all the medications you take. 7. Pain Medication from other Providers: You are not allowed to accept any additional pain medication from any other Doctor or Healthcare  provider. There are two exceptions to this rule. (see below) In the event that you require additional pain medication, you are responsible for notifying us, as stated below. 8. Medication Agreement: You are responsible for carefully reading and following our Medication Agreement. This must be signed before receiving any prescriptions from our practice. Safely store a copy of your signed Agreement. Violations to the Agreement will result in no further prescriptions. (Additional copies of our Medication Agreement are available upon request.) 9. Laws, Rules, & Regulations: All patients are expected to follow all Federal and [employer], [employer], Rules, [employer]. Ignorance of the Laws does not constitute a valid excuse. The use of any illegal substances is prohibited. 10. Adopted CDC guidelines & recommendations: Target dosing levels will be at or below 60 MME/day. Use of benzodiazepines** is not recommended.  Exceptions: There are only two exceptions to the rule of not receiving pain medications from other Healthcare Providers. 1. Exception #1 (Emergencies): In the event of an emergency (i.e.: accident requiring emergency care), you are allowed to receive additional pain medication. However, you are responsible for: As soon as you are able, call our office [phone], at any time of the day or night, and leave a message stating your name, the date and nature of the emergency, and the name and dose of the medication prescribed. In the event that your call is answered by a member of our staff, make sure to document and save the date, time, and the name of the person that took your information.  2. Exception #2 (Planned Surgery): In the event that you are scheduled by another doctor or dentist to have any type of surgery or procedure, you are allowed (for a period no longer than 30 days), to receive additional pain medication, for the acute post-op pain. However, in this case, you are responsible for  picking up a copy of our "Post-op Pain Management for Surgeons" handout, and giving it to your surgeon or dentist. This document is available at our office, and does not require an appointment to obtain it. Simply go to our office during business hours (Monday-Thursday from 8:00 AM to 4:00 PM) (Friday 8:00 AM to 12:00 Noon) or if you have a scheduled appointment with [place], prior to your surgery, and ask for it by name. In addition, you will need to provide [place] with your name, name of your surgeon, type of surgery, and date of procedure or surgery.  *Opioid medications include: morphine, codeine, oxycodone, oxymorphone, hydrocodone, hydromorphone, meperidine, tramadol, tapentadol, buprenorphine, fentanyl, methadone. **Benzodiazepine medications include: diazepam (Valium), alprazolam (Xanax), clonazepam (Klonopine), lorazepam (Ativan), clorazepate (Tranxene), chlordiazepoxide (Librium), estazolam (Prosom), oxazepam (Serax), temazepam (Restoril), triazolam (Halcion)  ____________________________________________________________________________________________   BMI Assessment: Estimated body mass index is  30.54 kg/m as calculated from the following:   Height as of this encounter: _0  (1.702 m).   Weight as of this encounter: 195 lb (88.5 kg).  BMI interpretation table: BMI level Category Range association with higher incidence of chronic pain  <18 kg/m2 Underweight   18.5-24.9 kg/m2 Ideal body weight   25-29.9 kg/m2 Overweight Increased incidence by 20%  30-34.9 kg/m2 Obese (Class I) Increased incidence by 68%  35-39.9 kg/m2 Severe obesity (Class II) Increased incidence by 136%  >40 kg/m2 Extreme obesity (Class III) Increased incidence by 254%   BMI Readings from Last 4 Encounters:  07/29/17 30.54 kg/m  05/29/17 31.32 kg/m  04/30/17 31.47 kg/m  04/15/17 31.32 kg/m   Wt Readings from Last 4 Encounters:  07/29/17 195 lb (88.5 kg)  05/29/17 200 lb (90.7 kg)  04/30/17 195 lb (88.5 kg)   04/15/17 200 lb (90.7 kg)

## 2017-07-29 NOTE — Patient Instructions (Addendum)
____________________________________________________________________________________________  Medication Rules  Applies to: All patients receiving prescriptions (written or electronic).  Pharmacy of record: Pharmacy where electronic prescriptions will be sent. If written prescriptions are taken to a different pharmacy, please inform the nursing staff. The pharmacy listed in the electronic medical record should be the one where you would like electronic prescriptions to be sent.  Prescription refills: Only during scheduled appointments. Applies to both, written and electronic prescriptions.  NOTE: The following applies primarily to controlled substances (Opioid* Pain Medications).   Patient's responsibilities: 1. Pain Pills: Bring all pain pills to every appointment (except for procedure appointments). 2. Pill Bottles: Bring pills in original pharmacy bottle. Always bring newest bottle. Bring bottle, even if empty. 3. Medication refills: You are responsible for knowing and keeping track of what medications you need refilled. The day before your appointment, write a list of all prescriptions that need to be refilled. Bring that list to your appointment and give it to the admitting nurse. Prescriptions will be written only during appointments. If you forget a medication, it will not be "Called in", "Faxed", or "electronically sent". You will need to get another appointment to get these prescribed. 4. Prescription Accuracy: You are responsible for carefully inspecting your prescriptions before leaving our office. Have the discharge nurse carefully go over each prescription with you, before taking them home. Make sure that your name is accurately spelled, that your address is correct. Check the name and dose of your medication to make sure it is accurate. Check the number of pills, and the written instructions to make sure they are clear and accurate. Make sure that you are given enough medication to  last until your next medication refill appointment. 5. Taking Medication: Take medication as prescribed. Never take more pills than instructed. Never take medication more frequently than prescribed. Taking less pills or less frequently is permitted and encouraged, when it comes to controlled substances (written prescriptions).  6. Inform other Doctors: Always inform, all of your healthcare providers, of all the medications you take. 7. Pain Medication from other Providers: You are not allowed to accept any additional pain medication from any other Doctor or Healthcare provider. There are two exceptions to this rule. (see below) In the event that you require additional pain medication, you are responsible for notifying us, as stated below. 8. Medication Agreement: You are responsible for carefully reading and following our Medication Agreement. This must be signed before receiving any prescriptions from our practice. Safely store a copy of your signed Agreement. Violations to the Agreement will result in no further prescriptions. (Additional copies of our Medication Agreement are available upon request.) 9. Laws, Rules, & Regulations: All patients are expected to follow all Federal and State Laws, Statutes, Rules, & Regulations. Ignorance of the Laws does not constitute a valid excuse. The use of any illegal substances is prohibited. 10. Adopted CDC guidelines & recommendations: Target dosing levels will be at or below 60 MME/day. Use of benzodiazepines** is not recommended.  Exceptions: There are only two exceptions to the rule of not receiving pain medications from other Healthcare Providers. 1. Exception #1 (Emergencies): In the event of an emergency (i.e.: accident requiring emergency care), you are allowed to receive additional pain medication. However, you are responsible for: As soon as you are able, call our office [phone], at any time of the day or night, and leave a message stating your  name, the date and nature of the emergency, and the name and dose of the medication   prescribed. In the event that your call is answered by a member of our staff, make sure to document and save the date, time, and the name of the person that took your information.  2. Exception #2 (Planned Surgery): In the event that you are scheduled by another doctor or dentist to have any type of surgery or procedure, you are allowed (for a period no longer than 30 days), to receive additional pain medication, for the acute post-op pain. However, in this case, you are responsible for picking up a copy of our "Post-op Pain Management for Surgeons" handout, and giving it to your surgeon or dentist. This document is available at our office, and does not require an appointment to obtain it. Simply go to our office during business hours (Monday-Thursday from 8:00 AM to 4:00 PM) (Friday 8:00 AM to 12:00 Noon) or if you have a scheduled appointment with [place], prior to your surgery, and ask for it by name. In addition, you will need to provide [place] with your name, name of your surgeon, type of surgery, and date of procedure or surgery.  *Opioid medications include: morphine, codeine, oxycodone, oxymorphone, hydrocodone, hydromorphone, meperidine, tramadol, tapentadol, buprenorphine, fentanyl, methadone. **Benzodiazepine medications include: diazepam (Valium), alprazolam (Xanax), clonazepam (Klonopine), lorazepam (Ativan), clorazepate (Tranxene), chlordiazepoxide (Librium), estazolam (Prosom), oxazepam (Serax), temazepam (Restoril), triazolam (Halcion)  ____________________________________________________________________________________________   BMI Assessment: Estimated body mass index is 30.54 kg/m as calculated from the following:   Height as of this encounter: 5\' 7"  (1.702 m).   Weight as of this encounter: 195 lb (88.5 kg).  BMI interpretation table: BMI level Category Range association with higher incidence of chronic  pain  <18 kg/m2 Underweight   18.5-24.9 kg/m2 Ideal body weight   25-29.9 kg/m2 Overweight Increased incidence by 20%  30-34.9 kg/m2 Obese (Class I) Increased incidence by 68%  35-39.9 kg/m2 Severe obesity (Class II) Increased incidence by 136%  >40 kg/m2 Extreme obesity (Class III) Increased incidence by 254%   BMI Readings from Last 4 Encounters:  07/29/17 30.54 kg/m  05/29/17 31.32 kg/m  04/30/17 31.47 kg/m  04/15/17 31.32 kg/m   Wt Readings from Last 4 Encounters:  07/29/17 195 lb (88.5 kg)  05/29/17 200 lb (90.7 kg)  04/30/17 195 lb (88.5 kg)  04/15/17 200 lb (90.7 kg)

## 2017-07-29 NOTE — Progress Notes (Signed)
Nursing Pain Medication Assessment:  Safety precautions to be maintained throughout the outpatient stay will include: orient to surroundings, keep bed in low position, maintain call bell within reach at all times, provide assistance with transfer out of bed and ambulation.  Medication Inspection Compliance: Pill count conducted under aseptic conditions, in front of the patient. Neither the pills nor the bottle was removed from the patient's sight at any time. Once count was completed pills were immediately returned to the patient in their original bottle.  Medication: Oxycodone IR Pill/Patch Count: 0 of 120 pills remain Pill/Patch Appearance: Markings consistent with prescribed medication Bottle Appearance: Standard pharmacy container. Clearly labeled. Filled Date: 1 / 57 / 2019 Last Medication intake:  Yesterday

## 2017-08-12 ENCOUNTER — Other Ambulatory Visit: Payer: Self-pay

## 2017-08-12 ENCOUNTER — Encounter: Payer: Self-pay | Admitting: Emergency Medicine

## 2017-08-12 ENCOUNTER — Emergency Department: Payer: Medicare Other

## 2017-08-12 ENCOUNTER — Emergency Department
Admission: EM | Admit: 2017-08-12 | Discharge: 2017-08-12 | Disposition: A | Discharge status: Home / Self Care | Payer: Medicare Other | Attending: Student in an Organized Health Care Education/Training Program | Admitting: Student in an Organized Health Care Education/Training Program

## 2017-08-12 DIAGNOSIS — R109 Unspecified abdominal pain: Secondary | ICD-10-CM

## 2017-08-12 DIAGNOSIS — Z79899 Other long term (current) drug therapy: Secondary | ICD-10-CM | POA: Insufficient documentation

## 2017-08-12 DIAGNOSIS — N201 Calculus of ureter: Secondary | ICD-10-CM

## 2017-08-12 DIAGNOSIS — R319 Hematuria, unspecified: Secondary | ICD-10-CM | POA: Diagnosis present

## 2017-08-12 DIAGNOSIS — I10 Essential (primary) hypertension: Secondary | ICD-10-CM | POA: Insufficient documentation

## 2017-08-12 DIAGNOSIS — N200 Calculus of kidney: Secondary | ICD-10-CM | POA: Diagnosis not present

## 2017-08-12 DIAGNOSIS — Z7984 Long term (current) use of oral hypoglycemic drugs: Secondary | ICD-10-CM | POA: Insufficient documentation

## 2017-08-12 DIAGNOSIS — E119 Type 2 diabetes mellitus without complications: Secondary | ICD-10-CM | POA: Insufficient documentation

## 2017-08-12 LAB — CBC WITH DIFFERENTIAL/PLATELET
Basophils Absolute: 0.1 10*3/uL (ref 0–0.1)
Basophils Relative: 1 %
Eosinophils Absolute: 0.1 10*3/uL (ref 0–0.7)
Eosinophils Relative: 2 %
HEMATOCRIT: 49.8 % (ref 40.0–52.0)
HEMOGLOBIN: 16.8 g/dL (ref 13.0–18.0)
LYMPHS PCT: 27 %
Lymphs Abs: 2.7 10*3/uL (ref 1.0–3.6)
MCH: 30.2 pg (ref 26.0–34.0)
MCHC: 33.6 g/dL (ref 32.0–36.0)
MCV: 90 fL (ref 80.0–100.0)
Monocytes Absolute: 0.8 10*3/uL (ref 0.2–1.0)
Monocytes Relative: 8 %
NEUTROS ABS: 6.3 10*3/uL (ref 1.4–6.5)
Neutrophils Relative %: 62 %
Platelets: 243 10*3/uL (ref 150–440)
RBC: 5.54 MIL/uL (ref 4.40–5.90)
RDW: 14 % (ref 11.5–14.5)
WBC: 10 10*3/uL (ref 3.8–10.6)

## 2017-08-12 LAB — URINALYSIS, COMPLETE (UACMP) WITH MICROSCOPIC
Bilirubin Urine: NEGATIVE
GLUCOSE, UA: NEGATIVE mg/dL
Ketones, ur: NEGATIVE mg/dL
Leukocytes, UA: NEGATIVE
Nitrite: NEGATIVE
PROTEIN: 30 mg/dL — AB
Specific Gravity, Urine: 1.025 (ref 1.005–1.030)
Squamous Epithelial / LPF: NONE SEEN
pH: 6 (ref 5.0–8.0)

## 2017-08-12 LAB — BASIC METABOLIC PANEL
ANION GAP: 10 (ref 5–15)
BUN: 17 mg/dL (ref 6–20)
CO2: 25 mmol/L (ref 22–32)
Calcium: 9.3 mg/dL (ref 8.9–10.3)
Chloride: 99 mmol/L — ABNORMAL LOW (ref 101–111)
Creatinine, Ser: 0.84 mg/dL (ref 0.61–1.24)
GFR calc Af Amer: >60 mL/min (ref >60)
GFR calc non Af Amer: >60 mL/min (ref >60)
GLUCOSE: 151 mg/dL — AB (ref 65–99)
POTASSIUM: 2.6 mmol/L — AB (ref 3.5–5.1)
Sodium: 134 mmol/L — ABNORMAL LOW (ref 135–145)

## 2017-08-12 MED ORDER — MORPHINE SULFATE (PF) 4 MG/ML IV SOLN
4.0000 mg | INTRAVENOUS | Status: DC | PRN
  Administered 2017-08-12: 4 mg via INTRAVENOUS
  Filled 2017-08-12: qty 1

## 2017-08-12 MED ORDER — POTASSIUM CHLORIDE CRYS ER 20 MEQ PO TBCR
40.0000 meq | EXTENDED_RELEASE_TABLET | Freq: Once | ORAL | Status: AC
  Administered 2017-08-12: 40 meq via ORAL
  Filled 2017-08-12: qty 2

## 2017-08-12 MED ORDER — KETOROLAC TROMETHAMINE 30 MG/ML IJ SOLN
15.0000 mg | Freq: Once | INTRAMUSCULAR | Status: DC
  Filled 2017-08-12: qty 1

## 2017-08-12 MED ORDER — TAMSULOSIN HCL 0.4 MG PO CAPS: 0.4000 mg | ORAL_CAPSULE | Freq: Every day | ORAL | 0 refills | Status: DC

## 2017-08-12 MED ORDER — PROMETHAZINE HCL 25 MG/ML IJ SOLN
12.5000 mg | Freq: Four times a day (QID) | INTRAMUSCULAR | Status: DC | PRN
  Filled 2017-08-12: qty 1

## 2017-08-12 NOTE — ED Provider Notes (Signed)
[facility] Emergency Department Provider Note    None    (approximate)  I have reviewed the triage vital signs and the nursing notes.   HISTORY  Chief Complaint Hematuria    HPI Howard Murphy is a 62 y.o. male history of kidney stones presents with chief complaint of right flank pain and blood in his urine as well as pain radiating down to his right testicle.  Woke up this morning with blood in his urine which brought him to the ER.  States the pain does feel different from his previous kidney stones and is been several years since his last one.  Has not had any previous interventions to remove kidney stones.  Denies any fevers.  No nausea or vomiting.  States the pain is mild to moderate.  Past Medical History:  Diagnosis Date  . 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  . Back ache 05/05/2013  . Diabetes mellitus without complication ([place])   . GERD (gastroesophageal reflux disease)   . Headache   . Hyperlipemia   . Hypertension   . Kidney stones   . Kidney stones   . Nephrolithiasis   . Shortness of breath dyspnea    Family History  Problem Relation Age of Onset  . Cancer Mother        Breast CA  . Stroke Father   . Heart disease Father   . Cancer Sister   . Cancer Maternal Grandmother   . Cancer Sister   . Bladder Cancer Neg Hx   . Prostate cancer Neg Hx   . Kidney cancer Neg Hx    Past Surgical History:  Procedure Laterality Date  . BACK SURGERY  2015   x3  . BACK SURGERY  08/09/2016  . CHOLECYSTECTOMY  2000  . COLONOSCOPY WITH PROPOFOL N/A 04/11/2015   Procedure: COLONOSCOPY WITH PROPOFOL;  Surgeon: Manya Silvas, MD;  Location[facility] ENDOSCOPY;  Service: Endoscopy;  Laterality: N/A;  . ESOPHAGOGASTRODUODENOSCOPY (EGD) WITH PROPOFOL N/A 04/11/2015   Procedure: ESOPHAGOGASTRODUODENOSCOPY (EGD) WITH PROPOFOL;  Surgeon: Manya Silvas, MD;  Location: [facility] ENDOSCOPY;  Service: Endoscopy;  Laterality: N/A;  . SHOULDER SURGERY Right  2016  . SHOULDER SURGERY Left 06/2015   Patient Active Problem List   Diagnosis Date Noted  . Acute postoperative pain 03/28/2017  . Medication monitoring encounter 03/07/2017  . 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 Spondylolisthesis at L3-L4 level 08/03/2016  . Chronic pain syndrome 07/09/2016  . Encounter for medication monitoring 02/24/2016  . Cervical spondylosis 12/17/2015  . Cervical facet arthropathy 12/17/2015  . Chronic neck pain (Bilateral) (R>L) 12/17/2015  . Cervical facet syndrome (Bilateral) (R>L) 12/17/2015  . Chronic upper extremity pain (Bilateral) (R>L) 12/17/2015  . Cervical foraminal stenosis 12/17/2015  .  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 Arthropathy of shoulder (Right) 12/17/2015  . Osteoarthritis 12/17/2015  . 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 Lumbar foraminal stenosis (L4-5) (Left) 12/17/2015  . Grade 1 Anterolisthesis (2 mm) of L4 over L5. 12/17/2015  . Lumbar facet (L4-5) synovial cyst (6 mm) (Left) 12/17/2015  . Chronic sacroiliac joint pain (Bilateral) (L>R) 12/08/2015  . Neurogenic pain 12/08/2015  . Abnormal MRI, lumbar spine 11/21/2015  . Radicular pain of shoulder (Right) 10/03/2015  . Carpal tunnel syndrome (Bilateral) 10/03/2015  . Chronic cervical radicular pain (Right) 10/03/2015  . Chronic low back pain (Primary Source of Pain) (Bilateral) (  L>R) 09/06/2015  . Opiate use (60 MME/Day) 09/05/2015  . Long term prescription opiate use 09/05/2015  . Long term current use of opiate analgesic 09/05/2015  . Spondylosis of lumbar spine 09/05/2015  . Lumbar facet joint syndrome ([place]) 09/05/2015  . Chronic hip pain (Left) 09/05/2015  . Diabetic peripheral neuropathy ([place]) 09/05/2015  . Chronic lumbar radicular pain (S1 Dermatome) (Bilateral) (L>R) 09/05/2015  . Hypokalemia 09/05/2015  . Chronic shoulder pain (Primary  Source of Pain) (Bilateral) (L>R) 09/05/2015  . Diabetes ([place]) 09/05/2015  . Chronic foot pain (Secondary source of pain) (Bilateral) (L>R) 09/05/2015  . Chronic hand pain [employer] source of pain) (Bilateral) (R>L) 09/05/2015  . Lumbar facet hypertrophy 09/05/2015  . Encounter for therapeutic drug level monitoring 09/05/2015  . Low testosterone 09/05/2015  . Occipital pain (Right) 09/05/2015  . 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 Hypertriglyceridemia 07/26/2015  . Chronic abdominal pain (RUQ) 07/14/2015  . Type 2 diabetes mellitus with peripheral neuropathy ([place]) 07/14/2015  . Chest pain 07/14/2015  . Complete tear of the shoulder rotator cuff (Left) 05/10/2015  . Complete tear of left rotator cuff 05/10/2015  . SOB (shortness of breath) 03/22/2015  . Elevated hemoglobin ([place]) 07/07/2013  . History of lumbar facet Synovial cyst, surgically removed. 05/29/2013    Class: History of  . 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 GERD (gastroesophageal reflux disease) 05/05/2013  . Benign prostatic hyperplasia 05/05/2013  . Hypertension 05/08/2011  . Hyperlipidemia 05/08/2011  . RA (rheumatoid arthritis) ([place]) 05/08/2011      Prior to Admission medications   Medication Sig Start Date End Date Taking? Authorizing Provider  albuterol (PROVENTIL HFA;VENTOLIN HFA) 108 (90 Base) MCG/ACT inhaler Inhale 2 puffs into the lungs every 6 (six) hours as needed for wheezing or shortness of breath. 05/29/17   Fisher, Linden Dolin, PA-C  atorvastatin (LIPITOR) 40 MG tablet Take 1 tablet (40 mg total) by mouth at bedtime. 03/08/17   Arnetha Courser, MD  celecoxib (CELEBREX) 200 MG capsule Take 1 capsule (200 mg total) by mouth every 12 (twelve) hours. 08/07/16   Costella, Vista Mink, PA-C  chlorthalidone (HYGROTON) 25 MG tablet Take 1 tablet (25 mg total) by mouth daily. 03/08/17   Arnetha Courser, MD  etodolac (LODINE) 200 MG capsule Take 1 capsule (200 mg total) every 8 (eight) hours by mouth.  04/16/17   Loney Hering, MD  gabapentin (NEURONTIN) 100 MG capsule Take 1 capsule (100 mg total) by mouth at bedtime. 03/07/17   Arnetha Courser, MD  lisinopril (PRINIVIL,ZESTRIL) 20 MG tablet Take 1 tablet (20 mg total) by mouth daily. Reported on 12/08/2015 03/08/17   Arnetha Courser, MD  methylPREDNISolone (MEDROL DOSEPAK) 4 MG TBPK tablet Take 6 pills on day one then decrease by 1 pill each day 05/29/17   Versie Starks, PA-C  Oxycodone HCl 10 MG TABS Take 1 tablet (10 mg total) by mouth every 6 (six) hours as needed. 09/27/17 10/27/17  Vevelyn Francois, NP  Oxycodone HCl 10 MG TABS Take 1 tablet (10 mg total) by mouth every 6 (six) hours as needed. 08/28/17 09/27/17  Vevelyn Francois, NP  Oxycodone HCl 10 MG TABS Take 1 tablet (10 mg total) by mouth every 6 (six) hours as needed. 07/29/17 08/28/17  Vevelyn Francois, NP  ranitidine (ZANTAC) 75 MG tablet Take 75 mg by mouth daily.    [provider]  sitaGLIPtin (JANUVIA) 50 MG tablet Take 1 tablet (50 mg total) by mouth daily.  03/08/17   Arnetha Courser, MD    Allergies Patient has no known allergies.    Social History Social History   Tobacco Use  . Smoking status: Never Smoker  . Smokeless tobacco: Former Systems developer    Types: Chew  Substance Use Topics  . Alcohol use: Yes    Comment: occasional  . Drug use: No    Review of Systems Patient denies headaches, rhinorrhea, blurry vision, numbness, shortness of breath, chest pain, edema, cough, abdominal pain, nausea, vomiting, diarrhea, dysuria, fevers, rashes or hallucinations unless otherwise stated above in HPI. ____________________________________________   PHYSICAL EXAM:  VITAL SIGNS: Vitals:   08/12/17 1946  BP: 136/83  Pulse: 80  Resp: 16  Temp: 97.9 F (36.6 C)  SpO2: 97%    Constitutional: Alert and oriented. Well appearing and in no acute distress. Eyes: Conjunctivae are normal.  Head: Atraumatic. Nose: No congestion/rhinnorhea. Mouth/Throat: Mucous membranes  are moist.   Neck: No stridor. Painless ROM.  Cardiovascular: Normal rate, regular rhythm. Grossly normal heart sounds.  Good peripheral circulation. Respiratory: Normal respiratory effort.  No retractions. Lungs CTAB. Gastrointestinal: Soft and nontender. No distention. No abdominal bruits. No CVA tenderness. Genitourinary:  Musculoskeletal: No lower extremity tenderness nor edema.  No joint effusions. Neurologic:  Normal speech and language. No gross focal neurologic deficits are appreciated. No facial droop Skin:  Skin is warm, dry and intact. No rash noted. Psychiatric: Mood and affect are normal. Speech and behavior are normal.  ____________________________________________   LABS (all labs ordered are listed, but only abnormal results are displayed)  Results for orders placed or performed during the hospital encounter of 08/12/17 (from the past 24 hour(s))  Urinalysis, Complete w Microscopic     Status: Abnormal   Collection Time: 08/12/17  7:44 PM  Result Value Ref Range   Color, Urine YELLOW (A) YELLOW   APPearance CLOUDY (A) CLEAR   Specific Gravity, Urine 1.025 1.005 - 1.030   pH 6.0 5.0 - 8.0   Glucose, UA NEGATIVE NEGATIVE mg/dL   Hgb urine dipstick LARGE (A) NEGATIVE   Bilirubin Urine NEGATIVE NEGATIVE   Ketones, ur NEGATIVE NEGATIVE mg/dL   Protein, ur 30 (A) NEGATIVE mg/dL   Nitrite NEGATIVE NEGATIVE   Leukocytes, UA NEGATIVE NEGATIVE   RBC / HPF TOO NUMEROUS TO COUNT 0 - 5 RBC/hpf   WBC, UA 0-5 0 - 5 WBC/hpf   Bacteria, UA RARE (A) NONE SEEN   Squamous Epithelial / LPF NONE SEEN NONE SEEN   Mucus PRESENT    ____________________________________________ ____________________________________________  RADIOLOGY  I personally reviewed all radiographic images ordered to evaluate for the above acute complaints and reviewed radiology reports and findings.  These findings were personally discussed with the patient.  Please see medical record for radiology  report.  ____________________________________________   PROCEDURES  Procedure(s) performed:  Procedures    Critical Care performed: no ____________________________________________   INITIAL IMPRESSION / ASSESSMENT AND PLAN / ED COURSE  Pertinent labs & imaging results that were available during my care of the patient were reviewed by me and considered in my medical decision making (see chart for details).  DDX: Stone, Pyelo, mass, muscular skeletal strain  BANYAN GOODCHILD is a 62 y.o. who presents to the ED with p/w right flank pain. No fevers, no systemic symptoms. + urinary symptoms. Denies trauma or injury. Afebrile in ED. Exam as above. Flank TTP, otherwise abdominal exam is benign. No peritoneal signs. Possible kidney stone, cystitis, or pyelonephritis.Marland Kitchen UA with hematuria, no  infection. CT Stone with 76mm right stoneClinical picture is not consistent with appendicitis, diverticulitis, pancreatitis, cholecystitis, bowel perforation, aortic dissection, splenic injury or acute abdominal process at this time.  Pain improved, tolerating PO. Repeat ABD exam benign, will plan supportive treatment and early follow up for recheck.       ____________________________________________   FINAL CLINICAL IMPRESSION(S) / ED DIAGNOSES  Final diagnoses:  Acute right flank pain  Ureterolithiasis      NEW MEDICATIONS STARTED DURING THIS VISIT:  New Prescriptions   No medications on file     Note:  This document was prepared using Dragon voice recognition software and may include unintentional dictation errors.    Merlyn Lot, MD 08/12/17 2230

## 2017-08-12 NOTE — ED Triage Notes (Signed)
Pt arrived to the ED accompanied his significant other for complaints of right flank pain and blood in the urine. Pt states that he had kidney stones in the past and this feels just like it. Pt is AOx4 in no apparent distress.

## 2017-08-12 NOTE — ED Notes (Signed)
Patient transported to CT 

## 2017-08-12 NOTE — ED Notes (Signed)
Date and time results received: 08/12/17 2200 (use smartphrase ".now" to insert current time)  Test: potassium Critical Value: 2.6  Name of Provider Notified: MD Quentin Cornwall  Orders Received? Or Actions Taken?: Orders Received - See Orders for details

## 2017-08-15 ENCOUNTER — Ambulatory Visit (INDEPENDENT_AMBULATORY_CARE_PROVIDER_SITE_OTHER): Payer: Medicare Other | Admitting: Urology

## 2017-08-15 ENCOUNTER — Other Ambulatory Visit: Payer: Self-pay | Admitting: Radiology

## 2017-08-15 DIAGNOSIS — N201 Calculus of ureter: Secondary | ICD-10-CM

## 2017-08-15 DIAGNOSIS — R310 Gross hematuria: Secondary | ICD-10-CM | POA: Diagnosis not present

## 2017-08-15 DIAGNOSIS — N200 Calculus of kidney: Secondary | ICD-10-CM

## 2017-08-15 DIAGNOSIS — N132 Hydronephrosis with renal and ureteral calculous obstruction: Secondary | ICD-10-CM | POA: Diagnosis not present

## 2017-08-15 NOTE — Progress Notes (Signed)
08/15/2017 7:24 PM   [name] 03-Feb-1956 [id number]  Referring provider: Arnetha Courser, MD [address] [place], [place] [address]  Chief Complaint  Patient presents with  . Follow-up    ER    HPI: Patient is a 62 year old Caucasian male who is referred by [facility] ED for nephrolithiasis.  He presented to the ED on 08/12/2017 with the complaint of gross hematuria and right flank pain that radiated to the right testicle.  His UA was positive for TNTC RBC's.  His WBC count was 10.0.  His creatinine was 0.84.  His potassium was found to be 2.6.  He states they gave him two potassium pills.    CT Renal stone study performed on 08/12/2017 noted adrenal glands are normal.  On the right, there is a 6 mm stone positioned within the right renal pelvis (series 2, image 37). Associated mild pelviectasis, suggesting that this be at least partially obstructive. Additional punctate nonobstructive calculus within the upper pole the right kidney. Right ureter decompressed distally with no other calculi Identified.  Faint punctate nonobstructive calculi present within the left kidney. No hydronephrosis. No radiopaque calculi seen along the course of the left renal collecting system. No left-sided hydroureter.  Partially distended bladder within normal limits. No layering stones within the bladder lumen.  Today, he is having right flank pain.  He states he began one month ago.  8/10 pain.  Nothing helps the pain.  Nothing makes the pain worse.  He is having associated frequency, urgency, intermittency, straining to urinate, gross hematuria and a weak urinary stream.  Patient denies any dysuria.  Patient is having nausea, but he denies any fevers, chills or vomiting.   His UA today is positive for 3-10 RBC's.    He does have a prior history of stones.  Stone composition is unknown.     PMH: Past Medical History:  Diagnosis Date  . 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  . Back  ache 05/05/2013  . Diabetes mellitus without complication ([place])   . GERD (gastroesophageal reflux disease)   . Headache   . Hyperlipemia   . Hypertension   . Kidney stones   . Kidney stones   . Nephrolithiasis   . Shortness of breath dyspnea     Surgical History: Past Surgical History:  Procedure Laterality Date  . BACK SURGERY  2015   x3  . BACK SURGERY  08/09/2016  . CHOLECYSTECTOMY  2000  . COLONOSCOPY WITH PROPOFOL N/A 04/11/2015   Procedure: COLONOSCOPY WITH PROPOFOL;  Surgeon: Manya Silvas, MD;  Location: [facility] ENDOSCOPY;  Service: Endoscopy;  Laterality: N/A;  . ESOPHAGOGASTRODUODENOSCOPY (EGD) WITH PROPOFOL N/A 04/11/2015   Procedure: ESOPHAGOGASTRODUODENOSCOPY (EGD) WITH PROPOFOL;  Surgeon: Manya Silvas, MD;  Location[facility] ENDOSCOPY;  Service: Endoscopy;  Laterality: N/A;  . SHOULDER SURGERY Right 2016  . SHOULDER SURGERY Left 06/2015    Home Medications:  Allergies as of 08/15/2017   No Known Allergies     Medication List        Accurate as of 08/15/17 11:59 PM. Always use your most recent med list.          albuterol 108 (90 Base) MCG/ACT inhaler Commonly known as:  PROVENTIL HFA;VENTOLIN HFA Inhale 2 puffs into the lungs every 6 (six) hours as needed for wheezing or shortness of breath.   atorvastatin 40 MG tablet Commonly known as:  LIPITOR Take 1 tablet (40 mg total) by mouth  at bedtime.   celecoxib 200 MG capsule Commonly known as:  CELEBREX Take 1 capsule (200 mg total) by mouth every 12 (twelve) hours.   chlorthalidone 25 MG tablet Commonly known as:  HYGROTON Take 1 tablet (25 mg total) by mouth daily.   etodolac 200 MG capsule Commonly known as:  LODINE Take 1 capsule (200 mg total) every 8 (eight) hours by mouth.   gabapentin 100 MG capsule Commonly known as:  NEURONTIN Take 1 capsule (100 mg total) by mouth at bedtime.   lisinopril 20 MG tablet Commonly known as:  PRINIVIL,ZESTRIL Take 1 tablet (20 mg total) by mouth daily.  Reported on 12/08/2015   methylPREDNISolone 4 MG Tbpk tablet Commonly known as:  MEDROL DOSEPAK Take 6 pills on day one then decrease by 1 pill each day   Oxycodone HCl 10 MG Tabs Take 1 tablet (10 mg total) by mouth every 6 (six) hours as needed.   Oxycodone HCl 10 MG Tabs Take 1 tablet (10 mg total) by mouth every 6 (six) hours as needed. Start taking on:  08/28/2017   Oxycodone HCl 10 MG Tabs Take 1 tablet (10 mg total) by mouth every 6 (six) hours as needed. Start taking on:  09/27/2017   ranitidine 75 MG tablet Commonly known as:  ZANTAC Take 75 mg by mouth daily.   sitaGLIPtin 50 MG tablet Commonly known as:  JANUVIA Take 1 tablet (50 mg total) by mouth daily.   tamsulosin 0.4 MG Caps capsule Commonly known as:  FLOMAX Take 1 capsule (0.4 mg total) by mouth daily after supper.       Allergies: No Known Allergies  Family History: Family History  Problem Relation Age of Onset  . Cancer Mother        Breast CA  . Stroke Father   . Heart disease Father   . Cancer Sister   . Cancer Maternal Grandmother   . Cancer Sister   . Bladder Cancer Neg Hx   . Prostate cancer Neg Hx   . Kidney cancer Neg Hx     Social History:  reports that  has never smoked. He quit smokeless tobacco use about 4 years ago. His smokeless tobacco use included chew. He reports that he drinks alcohol. He reports that he does not use drugs.  ROS: UROLOGY Frequent Urination?: Yes Hard to postpone urination?: Yes Burning/pain with urination?: No Get up at night to urinate?: No Leakage of urine?: No Urine stream starts and stops?: Yes Trouble starting stream?: No Do you have to strain to urinate?: Yes Blood in urine?: Yes Urinary tract infection?: No Sexually transmitted disease?: No Injury to kidneys or bladder?: No Painful intercourse?: No Weak stream?: Yes Erection problems?: Yes Penile pain?: No  Gastrointestinal Nausea?: Yes Vomiting?: No Indigestion/heartburn?:  Yes Diarrhea?: Yes Constipation?: Yes  Constitutional Fever: No Night sweats?: Yes Weight loss?: No Fatigue?: Yes  Skin Skin rash/lesions?: No Itching?: No  Eyes Blurred vision?: Yes Double vision?: No  Ears/Nose/Throat Sore throat?: No Sinus problems?: No  Hematologic/Lymphatic Swollen glands?: No Easy bruising?: No  Cardiovascular Leg swelling?: No Chest pain?: No  Respiratory Cough?: No Shortness of breath?: Yes  Endocrine Excessive thirst?: Yes  Musculoskeletal Back pain?: Yes Joint pain?: Yes  Neurological Headaches?: Yes Dizziness?: Yes  Psychologic Depression?: No Anxiety?: No  Physical Exam: There were no vitals taken for this visit.  Constitutional: Well nourished. Alert and oriented, No acute distress. HEENT: [place] AT, moist mucus membranes. Trachea midline, no masses. Cardiovascular: No clubbing,  cyanosis, or edema. Respiratory: Normal respiratory effort, no increased work of breathing. GI: Abdomen is soft, non tender, non distended, no abdominal masses. Liver and spleen not palpable.  No hernias appreciated.  Stool sample for occult testing is not indicated.   GU: No CVA tenderness.  No bladder fullness or masses.   Skin: No rashes, bruises or suspicious lesions. Lymph: No cervical or inguinal adenopathy. Neurologic: Grossly intact, no focal deficits, moving all 4 extremities. Psychiatric: Normal mood and affect.  Laboratory Data: Lab Results  Component Value Date   WBC 10.0 08/12/2017   HGB 16.8 08/12/2017   HCT 49.8 08/12/2017   MCV 90.0 08/12/2017   PLT 243 08/12/2017    Lab Results  Component Value Date   CREATININE 0.84 08/12/2017    No results found for: PSA  No results found for: TESTOSTERONE  Lab Results  Component Value Date   HGBA1C 6.9 (H) 03/07/2017    No results found for: TSH     Component Value Date/Time   CHOL 166 03/07/2017 1443   HDL 39 (L) 03/07/2017 1443   CHOLHDL 4.3 03/07/2017 1443   CHOLHDL  5.1 (H) 02/24/2016 1557   VLDL 48 (H) 02/24/2016 1557   LDLCALC 98 03/07/2017 1443    Lab Results  Component Value Date   AST 29 04/15/2017   Lab Results  Component Value Date   ALT 34 04/15/2017   No components found for: ALKALINEPHOPHATASE No components found for: BILIRUBINTOTAL  No results found for: ESTRADIOL  Urinalysis    Component Value Date/Time   COLORURINE YELLOW (A) 08/12/2017 1944   APPEARANCEUR Clear 08/15/2017 1347   LABSPEC 1.025 08/12/2017 1944   LABSPEC 1.015 03/23/2014 2003   PHURINE 6.0 08/12/2017 1944   GLUCOSEU Negative 08/15/2017 1347   GLUCOSEU 50 mg/dL 03/23/2014 2003   HGBUR LARGE (A) 08/12/2017 1944   BILIRUBINUR Negative 08/15/2017 1347   BILIRUBINUR Negative 03/23/2014 2003   [place] NEGATIVE 08/12/2017 1944   PROTEINUR Negative 08/15/2017 1347   PROTEINUR 30 (A) 08/12/2017 1944   UROBILINOGEN 1.0 02/23/2015 2347   NITRITE Negative 08/15/2017 1347   NITRITE NEGATIVE 08/12/2017 1944   LEUKOCYTESUR Negative 08/15/2017 1347   LEUKOCYTESUR Negative 03/23/2014 2003    I have reviewed the labs.   Pertinent Imaging: CLINICAL DATA:  Initial evaluation for acute right flank pain.  EXAM: CT ABDOMEN AND PELVIS WITHOUT CONTRAST  TECHNIQUE: Multidetector CT imaging of the abdomen and pelvis was performed following the standard protocol without IV contrast.  COMPARISON:  Prior CT from 04/16/2017.  FINDINGS: Lower chest: Mild scattered atelectatic changes within the right lung base. Visualized lungs are otherwise clear.  Hepatobiliary: Hepatic steatosis. Liver otherwise unremarkable. Gallbladder surgically absent. No biliary dilatation.  Pancreas: Pancreas within normal limits.  Spleen: Spleen within normal limits.  Adrenals/Urinary Tract: Adrenal glands are normal.  On the right, there is a 6 mm stone positioned within the right renal pelvis (series 2, image 37). Associated mild pelviectasis, suggesting that this be at  least partially obstructive. Additional punctate nonobstructive calculus within the upper pole the right kidney. Right ureter decompressed distally with no other calculi identified.  Faint punctate nonobstructive calculi present within the left kidney. No hydronephrosis. No radiopaque calculi seen along the course of the left renal collecting system. No left-sided hydroureter.  Partially distended bladder within normal limits. No layering stones within the bladder lumen.  Stomach/Bowel: Stomach within normal limits. No evidence for bowel obstruction. Appendix is normal. No acute inflammatory changes seen about the bowels.  Vascular/Lymphatic: Intra-abdominal aorta of normal caliber. Mild aortic atherosclerosis. No adenopathy.  Reproductive: Prostate within normal limits.  Other: No free air or fluid. Small fat containing paraumbilical and bilateral inguinal hernias without associated inflammation.  Musculoskeletal: No acute osseous abnormality. No worrisome lytic or blastic osseous lesions. Sequelae of prior PLIF at L3-4 and L4-5.  IMPRESSION: 1. 6 mm stone lodged within the right renal pelvis with secondary mild right pelviectasis. 2. Additional punctate nonobstructive right renal nephrolithiasis. 3. No other acute intra-abdominal or pelvic process.   Electronically Signed   By: Jeannine Boga M.D.   On: 08/12/2017 21:59   I have independently reviewed the films.    Assessment & Plan:    1. Right UPJ stone  -explained to the patient that AUA Guidelines for patients with uncomplicated ureteral stones ?10 mm should be offered observation, and those with distal stones of similar size should be offered MET with ?-blockers - he will continue the tamsulosin but he is wanting to move forward with definitive management  - explained that URS would be the first lined therapy if stone(s) do not pass, but ESWL is the procedure with the least morbidity and lowest  complication rate, but URS has a greater stone-free rate in a single procedure - he would like to pursue right URS  - patient will be scheduled for right URS/LL/ureteral stent placement  - explained to the patient how the procedure is performed and the risks involved  - informed patient that they will have a stent placed during the procedure and will remain in place after the procedure for a short time.   - stent may be removed in the office with a cystoscope or patient may be instructed to remove the stent themselves by the string  - described "stent pain" as feelings of needing to urinate/overactive bladder and a warm, tingling sensation to intense pain in the affected flank  - residual stones within the kidney or ureter may be present after the procedure and may need to have these addressed at a different encounter  - injury to the ureter is the most common intra-operative risk, it may result in an open procedure to correct the defect  - infection and bleeding are also risks  - explained the risks of general anesthesia, such as: MI, CVA, paralysis, coma and/or death.  - advised to contact our office or seek treatment in the ED if becomes febrile or pain/ vomiting are difficult control in order to arrange for emergent/urgent intervention  2. Right hydronephrosis  - obtain RUS to ensure the hydronephrosis has resolved once he has recovered from URS  3. Gross hematuria  - UA today demonstrates 3-10 RBC's.    - continue to monitor the patient's UA after the treatment/passage of the stone to ensure the hematuria has resolved  - if hematuria persists, we will pursue a hematuria workup with CT Urogram and cystoscopy if appropriate.  - CULTURE, URINE COMPREHENSIVE  - Urinalysis, Complete  4. Bilateral renal stones  - punctate at this time no intervention warranted  - will offer a 24 hour metabolic study once he has recovered from URS   Return for right URS/LL/ureteral stent placement.  These  notes generated with voice recognition software. I apologize for typographical errors.  Zara Council, [facility] [address], [place], [place] [address] [phone]

## 2017-08-15 NOTE — Progress Notes (Signed)
error 

## 2017-08-15 NOTE — H&P (View-Only) (Signed)
08/15/2017 7:24 PM   [name] 08/22/1955 [id number]  Referring provider: Arnetha Courser, MD [address] [place], [place] [address]  Chief Complaint  Patient presents with  . Follow-up    ER    HPI: Patient is a 62 year old Caucasian male who is referred by [facility] ED for nephrolithiasis.  He presented to the ED on 08/12/2017 with the complaint of gross hematuria and right flank pain that radiated to the right testicle.  His UA was positive for TNTC RBC's.  His WBC count was 10.0.  His creatinine was 0.84.  His potassium was found to be 2.6.  He states they gave him two potassium pills.    CT Renal stone study performed on 08/12/2017 noted adrenal glands are normal.  On the right, there is a 6 mm stone positioned within the right renal pelvis (series 2, image 37). Associated mild pelviectasis, suggesting that this be at least partially obstructive. Additional punctate nonobstructive calculus within the upper pole the right kidney. Right ureter decompressed distally with no other calculi Identified.  Faint punctate nonobstructive calculi present within the left kidney. No hydronephrosis. No radiopaque calculi seen along the course of the left renal collecting system. No left-sided hydroureter.  Partially distended bladder within normal limits. No layering stones within the bladder lumen.  Today, he is having right flank pain.  He states he began one month ago.  8/10 pain.  Nothing helps the pain.  Nothing makes the pain worse.  He is having associated frequency, urgency, intermittency, straining to urinate, gross hematuria and a weak urinary stream.  Patient denies any dysuria.  Patient is having nausea, but he denies any fevers, chills or vomiting.   His UA today is positive for 3-10 RBC's.    He does have a prior history of stones.  Stone composition is unknown.     PMH: Past Medical History:  Diagnosis Date  . 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  . Back  ache 05/05/2013  . Diabetes mellitus without complication ([place])   . GERD (gastroesophageal reflux disease)   . Headache   . Hyperlipemia   . Hypertension   . Kidney stones   . Kidney stones   . Nephrolithiasis   . Shortness of breath dyspnea     Surgical History: Past Surgical History:  Procedure Laterality Date  . BACK SURGERY  2015   x3  . BACK SURGERY  08/09/2016  . CHOLECYSTECTOMY  2000  . COLONOSCOPY WITH PROPOFOL N/A 04/11/2015   Procedure: COLONOSCOPY WITH PROPOFOL;  Surgeon: Manya Silvas, MD;  Location: [facility] ENDOSCOPY;  Service: Endoscopy;  Laterality: N/A;  . ESOPHAGOGASTRODUODENOSCOPY (EGD) WITH PROPOFOL N/A 04/11/2015   Procedure: ESOPHAGOGASTRODUODENOSCOPY (EGD) WITH PROPOFOL;  Surgeon: Manya Silvas, MD;  Location: [facility] ENDOSCOPY;  Service: Endoscopy;  Laterality: N/A;  . SHOULDER SURGERY Right 2016  . SHOULDER SURGERY Left 06/2015    Home Medications:  Allergies as of 08/15/2017   No Known Allergies     Medication List        Accurate as of 08/15/17 11:59 PM. Always use your most recent med list.          albuterol 108 (90 Base) MCG/ACT inhaler Commonly known as:  PROVENTIL HFA;VENTOLIN HFA Inhale 2 puffs into the lungs every 6 (six) hours as needed for wheezing or shortness of breath.   atorvastatin 40 MG tablet Commonly known as:  LIPITOR Take 1 tablet (40 mg total) by mouth  at bedtime.   celecoxib 200 MG capsule Commonly known as:  CELEBREX Take 1 capsule (200 mg total) by mouth every 12 (twelve) hours.   chlorthalidone 25 MG tablet Commonly known as:  HYGROTON Take 1 tablet (25 mg total) by mouth daily.   etodolac 200 MG capsule Commonly known as:  LODINE Take 1 capsule (200 mg total) every 8 (eight) hours by mouth.   gabapentin 100 MG capsule Commonly known as:  NEURONTIN Take 1 capsule (100 mg total) by mouth at bedtime.   lisinopril 20 MG tablet Commonly known as:  PRINIVIL,ZESTRIL Take 1 tablet (20 mg total) by mouth daily.  Reported on 12/08/2015   methylPREDNISolone 4 MG Tbpk tablet Commonly known as:  MEDROL DOSEPAK Take 6 pills on day one then decrease by 1 pill each day   Oxycodone HCl 10 MG Tabs Take 1 tablet (10 mg total) by mouth every 6 (six) hours as needed.   Oxycodone HCl 10 MG Tabs Take 1 tablet (10 mg total) by mouth every 6 (six) hours as needed. Start taking on:  08/28/2017   Oxycodone HCl 10 MG Tabs Take 1 tablet (10 mg total) by mouth every 6 (six) hours as needed. Start taking on:  09/27/2017   ranitidine 75 MG tablet Commonly known as:  ZANTAC Take 75 mg by mouth daily.   sitaGLIPtin 50 MG tablet Commonly known as:  JANUVIA Take 1 tablet (50 mg total) by mouth daily.   tamsulosin 0.4 MG Caps capsule Commonly known as:  FLOMAX Take 1 capsule (0.4 mg total) by mouth daily after supper.       Allergies: No Known Allergies  Family History: Family History  Problem Relation Age of Onset  . Cancer Mother        Breast CA  . Stroke Father   . Heart disease Father   . Cancer Sister   . Cancer Maternal Grandmother   . Cancer Sister   . Bladder Cancer Neg Hx   . Prostate cancer Neg Hx   . Kidney cancer Neg Hx     Social History:  reports that  has never smoked. He quit smokeless tobacco use about 4 years ago. His smokeless tobacco use included chew. He reports that he drinks alcohol. He reports that he does not use drugs.  ROS: UROLOGY Frequent Urination?: Yes Hard to postpone urination?: Yes Burning/pain with urination?: No Get up at night to urinate?: No Leakage of urine?: No Urine stream starts and stops?: Yes Trouble starting stream?: No Do you have to strain to urinate?: Yes Blood in urine?: Yes Urinary tract infection?: No Sexually transmitted disease?: No Injury to kidneys or bladder?: No Painful intercourse?: No Weak stream?: Yes Erection problems?: Yes Penile pain?: No  Gastrointestinal Nausea?: Yes Vomiting?: No Indigestion/heartburn?:  Yes Diarrhea?: Yes Constipation?: Yes  Constitutional Fever: No Night sweats?: Yes Weight loss?: No Fatigue?: Yes  Skin Skin rash/lesions?: No Itching?: No  Eyes Blurred vision?: Yes Double vision?: No  Ears/Nose/Throat Sore throat?: No Sinus problems?: No  Hematologic/Lymphatic Swollen glands?: No Easy bruising?: No  Cardiovascular Leg swelling?: No Chest pain?: No  Respiratory Cough?: No Shortness of breath?: Yes  Endocrine Excessive thirst?: Yes  Musculoskeletal Back pain?: Yes Joint pain?: Yes  Neurological Headaches?: Yes Dizziness?: Yes  Psychologic Depression?: No Anxiety?: No  Physical Exam: There were no vitals taken for this visit.  Constitutional: Well nourished. Alert and oriented, No acute distress. HEENT: [place] AT, moist mucus membranes. Trachea midline, no masses. Cardiovascular: No clubbing,  cyanosis, or edema. Respiratory: Normal respiratory effort, no increased work of breathing. GI: Abdomen is soft, non tender, non distended, no abdominal masses. Liver and spleen not palpable.  No hernias appreciated.  Stool sample for occult testing is not indicated.   GU: No CVA tenderness.  No bladder fullness or masses.   Skin: No rashes, bruises or suspicious lesions. Lymph: No cervical or inguinal adenopathy. Neurologic: Grossly intact, no focal deficits, moving all 4 extremities. Psychiatric: Normal mood and affect.  Laboratory Data: Lab Results  Component Value Date   WBC 10.0 08/12/2017   HGB 16.8 08/12/2017   HCT 49.8 08/12/2017   MCV 90.0 08/12/2017   PLT 243 08/12/2017    Lab Results  Component Value Date   CREATININE 0.84 08/12/2017    No results found for: PSA  No results found for: TESTOSTERONE  Lab Results  Component Value Date   HGBA1C 6.9 (H) 03/07/2017    No results found for: TSH     Component Value Date/Time   CHOL 166 03/07/2017 1443   HDL 39 (L) 03/07/2017 1443   CHOLHDL 4.3 03/07/2017 1443   CHOLHDL  5.1 (H) 02/24/2016 1557   VLDL 48 (H) 02/24/2016 1557   LDLCALC 98 03/07/2017 1443    Lab Results  Component Value Date   AST 29 04/15/2017   Lab Results  Component Value Date   ALT 34 04/15/2017   No components found for: ALKALINEPHOPHATASE No components found for: BILIRUBINTOTAL  No results found for: ESTRADIOL  Urinalysis    Component Value Date/Time   COLORURINE YELLOW (A) 08/12/2017 1944   APPEARANCEUR Clear 08/15/2017 1347   LABSPEC 1.025 08/12/2017 1944   LABSPEC 1.015 03/23/2014 2003   PHURINE 6.0 08/12/2017 1944   GLUCOSEU Negative 08/15/2017 1347   GLUCOSEU 50 mg/dL 03/23/2014 2003   HGBUR LARGE (A) 08/12/2017 1944   BILIRUBINUR Negative 08/15/2017 1347   BILIRUBINUR Negative 03/23/2014 2003   [place] NEGATIVE 08/12/2017 1944   PROTEINUR Negative 08/15/2017 1347   PROTEINUR 30 (A) 08/12/2017 1944   UROBILINOGEN 1.0 02/23/2015 2347   NITRITE Negative 08/15/2017 1347   NITRITE NEGATIVE 08/12/2017 1944   LEUKOCYTESUR Negative 08/15/2017 1347   LEUKOCYTESUR Negative 03/23/2014 2003    I have reviewed the labs.   Pertinent Imaging: CLINICAL DATA:  Initial evaluation for acute right flank pain.  EXAM: CT ABDOMEN AND PELVIS WITHOUT CONTRAST  TECHNIQUE: Multidetector CT imaging of the abdomen and pelvis was performed following the standard protocol without IV contrast.  COMPARISON:  Prior CT from 04/16/2017.  FINDINGS: Lower chest: Mild scattered atelectatic changes within the right lung base. Visualized lungs are otherwise clear.  Hepatobiliary: Hepatic steatosis. Liver otherwise unremarkable. Gallbladder surgically absent. No biliary dilatation.  Pancreas: Pancreas within normal limits.  Spleen: Spleen within normal limits.  Adrenals/Urinary Tract: Adrenal glands are normal.  On the right, there is a 6 mm stone positioned within the right renal pelvis (series 2, image 37). Associated mild pelviectasis, suggesting that this be at  least partially obstructive. Additional punctate nonobstructive calculus within the upper pole the right kidney. Right ureter decompressed distally with no other calculi identified.  Faint punctate nonobstructive calculi present within the left kidney. No hydronephrosis. No radiopaque calculi seen along the course of the left renal collecting system. No left-sided hydroureter.  Partially distended bladder within normal limits. No layering stones within the bladder lumen.  Stomach/Bowel: Stomach within normal limits. No evidence for bowel obstruction. Appendix is normal. No acute inflammatory changes seen about the bowels.  Vascular/Lymphatic: Intra-abdominal aorta of normal caliber. Mild aortic atherosclerosis. No adenopathy.  Reproductive: Prostate within normal limits.  Other: No free air or fluid. Small fat containing paraumbilical and bilateral inguinal hernias without associated inflammation.  Musculoskeletal: No acute osseous abnormality. No worrisome lytic or blastic osseous lesions. Sequelae of prior PLIF at L3-4 and L4-5.  IMPRESSION: 1. 6 mm stone lodged within the right renal pelvis with secondary mild right pelviectasis. 2. Additional punctate nonobstructive right renal nephrolithiasis. 3. No other acute intra-abdominal or pelvic process.   Electronically Signed   By: Jeannine Boga M.D.   On: 08/12/2017 21:59   I have independently reviewed the films.    Assessment & Plan:    1. Right UPJ stone  -explained to the patient that AUA Guidelines for patients with uncomplicated ureteral stones ?10 mm should be offered observation, and those with distal stones of similar size should be offered MET with ?-blockers - he will continue the tamsulosin but he is wanting to move forward with definitive management  - explained that URS would be the first lined therapy if stone(s) do not pass, but ESWL is the procedure with the least morbidity and lowest  complication rate, but URS has a greater stone-free rate in a single procedure - he would like to pursue right URS  - patient will be scheduled for right URS/LL/ureteral stent placement  - explained to the patient how the procedure is performed and the risks involved  - informed patient that they will have a stent placed during the procedure and will remain in place after the procedure for a short time.   - stent may be removed in the office with a cystoscope or patient may be instructed to remove the stent themselves by the string  - described "stent pain" as feelings of needing to urinate/overactive bladder and a warm, tingling sensation to intense pain in the affected flank  - residual stones within the kidney or ureter may be present after the procedure and may need to have these addressed at a different encounter  - injury to the ureter is the most common intra-operative risk, it may result in an open procedure to correct the defect  - infection and bleeding are also risks  - explained the risks of general anesthesia, such as: MI, CVA, paralysis, coma and/or death.  - advised to contact our office or seek treatment in the ED if becomes febrile or pain/ vomiting are difficult control in order to arrange for emergent/urgent intervention  2. Right hydronephrosis  - obtain RUS to ensure the hydronephrosis has resolved once he has recovered from URS  3. Gross hematuria  - UA today demonstrates 3-10 RBC's.    - continue to monitor the patient's UA after the treatment/passage of the stone to ensure the hematuria has resolved  - if hematuria persists, we will pursue a hematuria workup with CT Urogram and cystoscopy if appropriate.  - CULTURE, URINE COMPREHENSIVE  - Urinalysis, Complete  4. Bilateral renal stones  - punctate at this time no intervention warranted  - will offer a 24 hour metabolic study once he has recovered from URS   Return for right URS/LL/ureteral stent placement.  These  notes generated with voice recognition software. I apologize for typographical errors.  Zara Council, [facility] [address], [place], [place] [address] [phone]

## 2017-08-16 LAB — MICROSCOPIC EXAMINATION
BACTERIA UA: NONE SEEN
EPITHELIAL CELLS (NON RENAL): NONE SEEN /HPF (ref 0–10)
WBC, UA: NONE SEEN /hpf (ref 0–?)

## 2017-08-16 LAB — URINALYSIS, COMPLETE
Bilirubin, UA: NEGATIVE
GLUCOSE, UA: NEGATIVE
Ketones, UA: NEGATIVE
Leukocytes, UA: NEGATIVE
NITRITE UA: NEGATIVE
Protein, UA: NEGATIVE
SPEC GRAV UA: 1.02 (ref 1.005–1.030)
Urobilinogen, Ur: 0.2 mg/dL (ref 0.2–1.0)
pH, UA: 5.5 (ref 5.0–7.5)

## 2017-08-16 IMAGING — CT CT ABD-PELV W/ CM
1 of 3 series · 14 of 32 positions shown, 19 images · IV contrast (omnipaque)
Comparison: 02/24/2015

CLINICAL DATA: Patient presents with RUQ abd pain for months,
however worse tonight. Patient has UGI and colonoscopy on [REDACTED];
has Bx done. Patient denies N/V/D/F. Patient reporting SOB that and
been going on for over 6 months. Fever and chills.

EXAM:
CT ABDOMEN AND PELVIS WITH CONTRAST
TECHNIQUE: Multidetector CT imaging of the abdomen and pelvis was performed
using the standard protocol following bolus administration of
intravenous contrast.
CONTRAST:  75mL OMNIPAQUE IOHEXOL 300 MG/ML  SOLN

[Series 2: routine abd pel with · axial · 0.79mm/px · z∈[-1375,-885]mm · 14 of 112 slices shown, 19 images]
[im 7/112  soft-tissue]
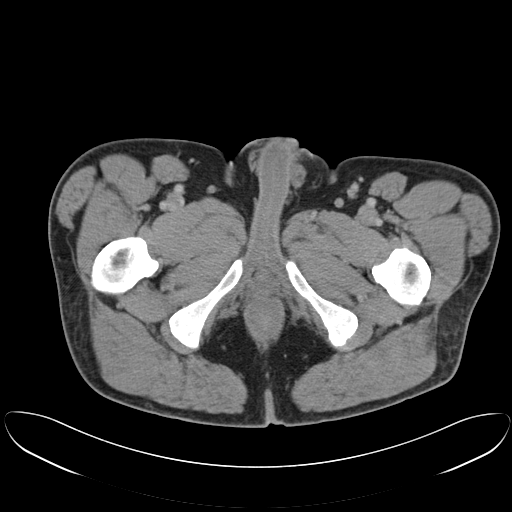
[im 7/112  bone]
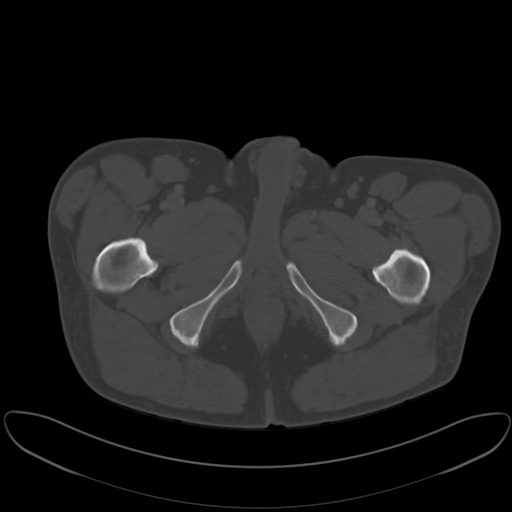
[im 14/112  soft-tissue]
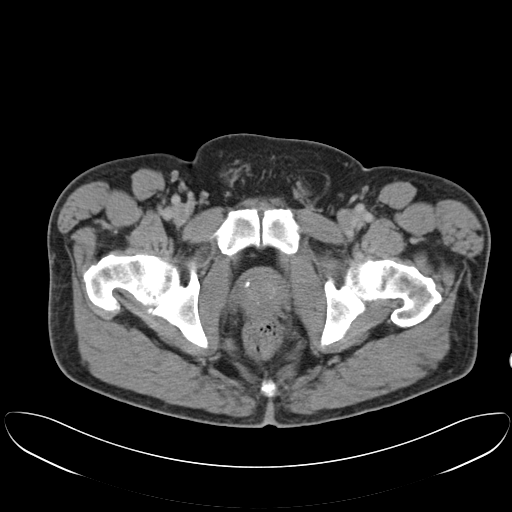
[im 21/112  soft-tissue]
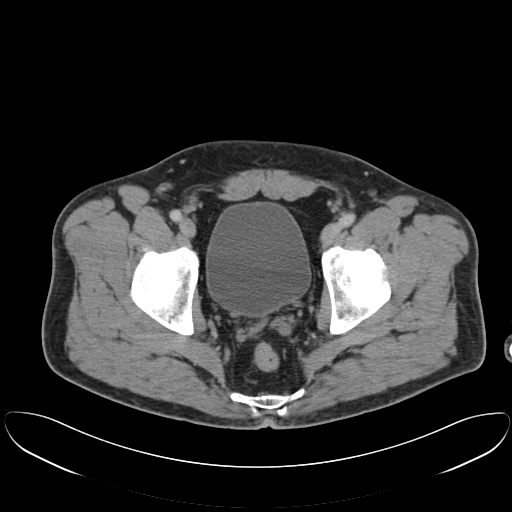
[im 35/112  soft-tissue]
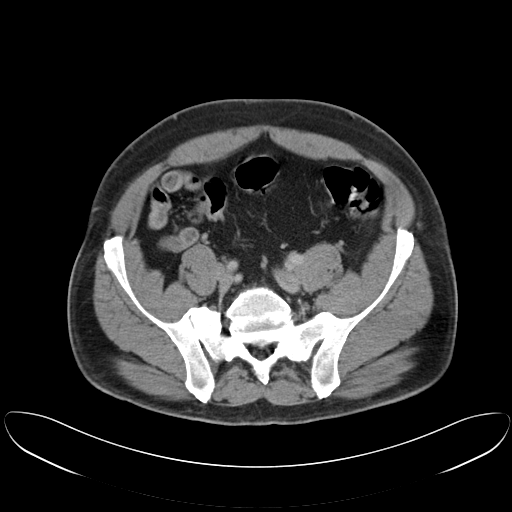
[im 42/112  soft-tissue]
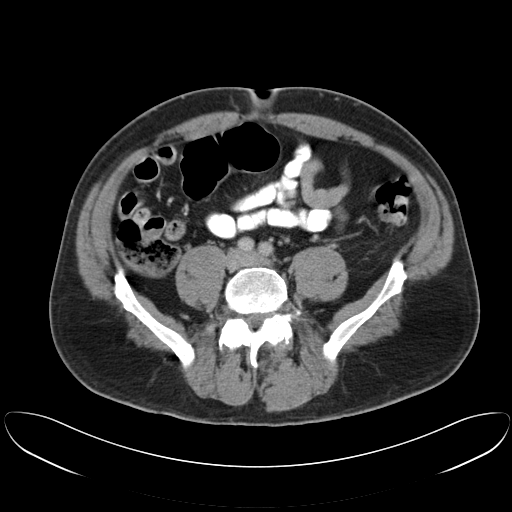
[im 49/112  soft-tissue]
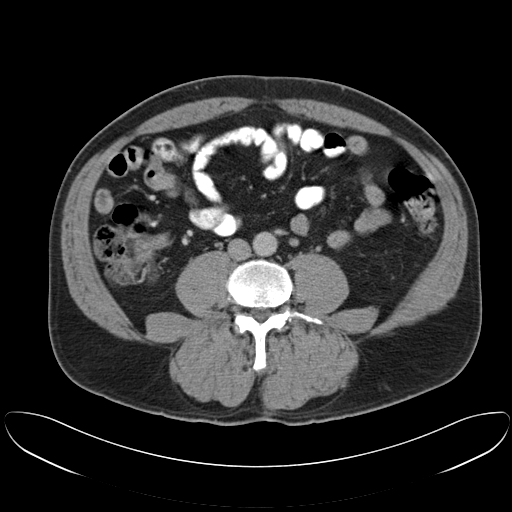
[im 56/112  soft-tissue]
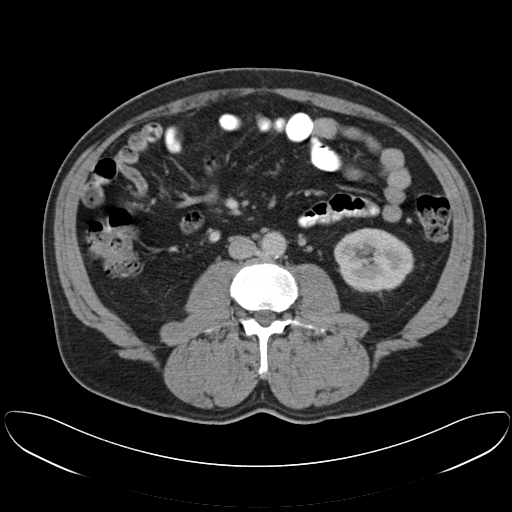
[im 63/112  soft-tissue]
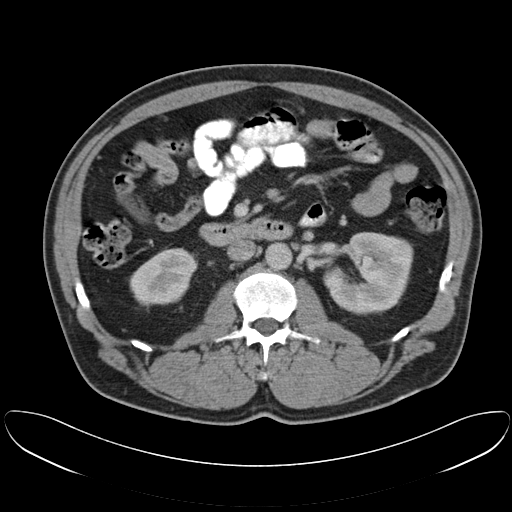
[im 70/112  soft-tissue]
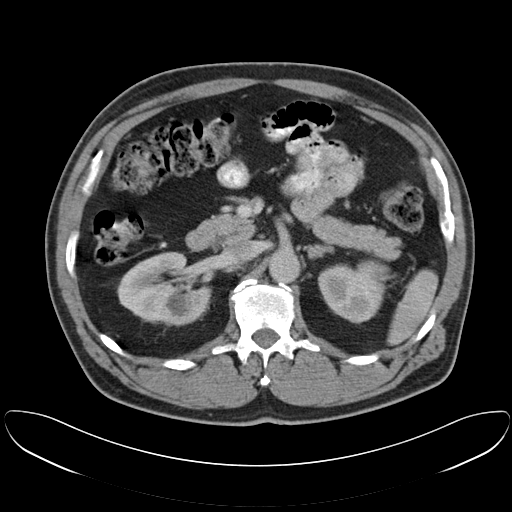
[im 70/112  bone]
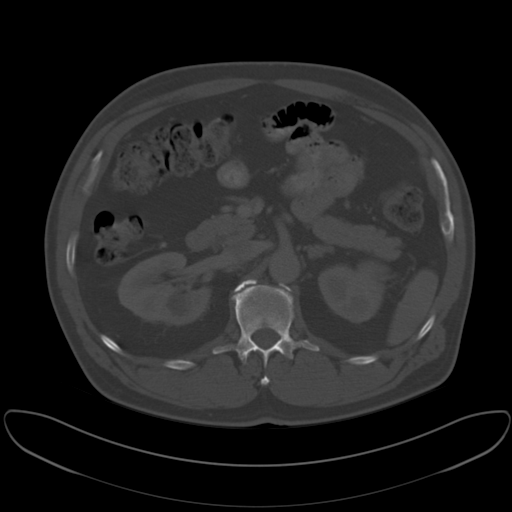
[im 77/112  soft-tissue]
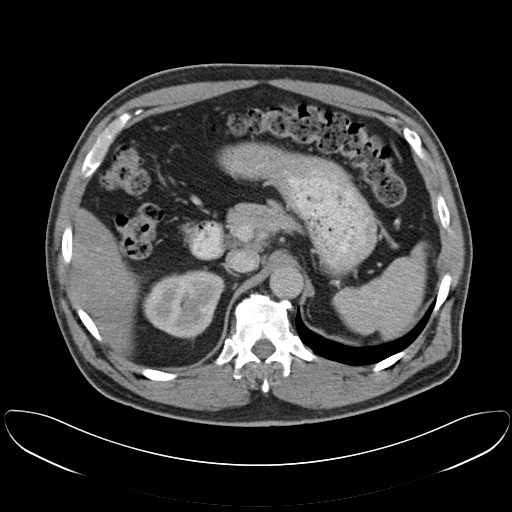
[im 84/112  lung]
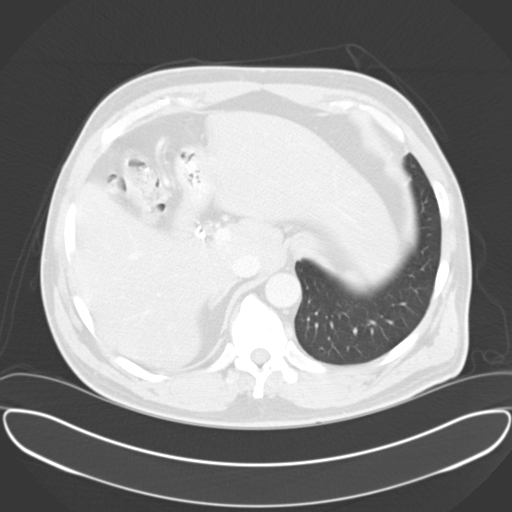
[im 91/112  soft-tissue]
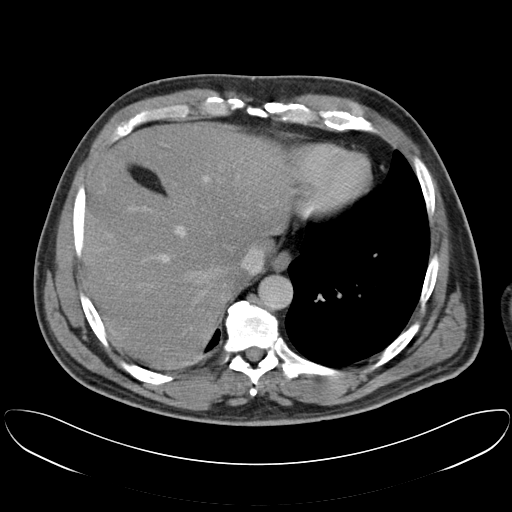
[im 91/112  lung]
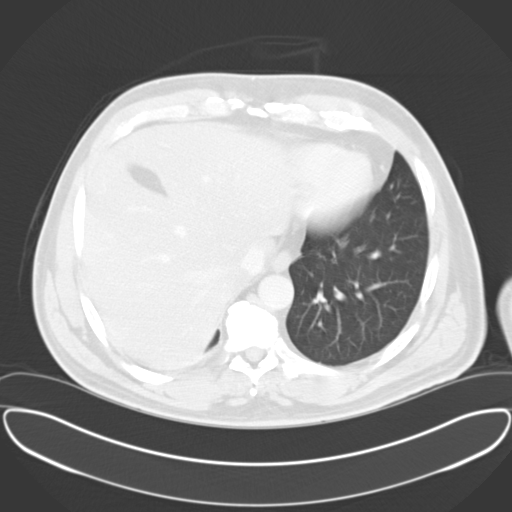
[im 98/112  soft-tissue]
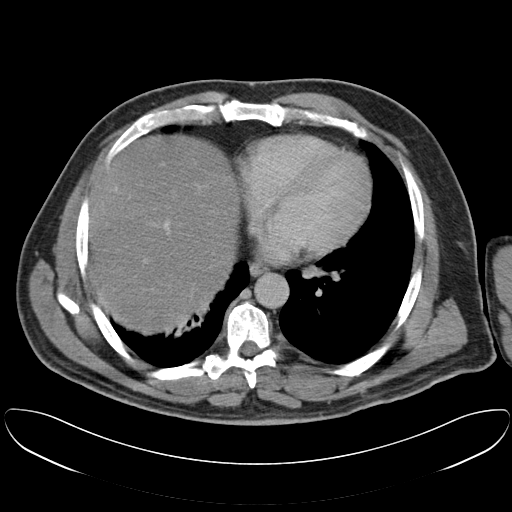
[im 98/112  lung]
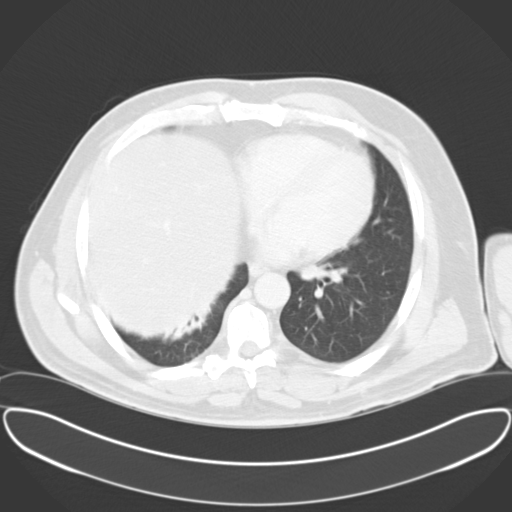
[im 105/112  soft-tissue]
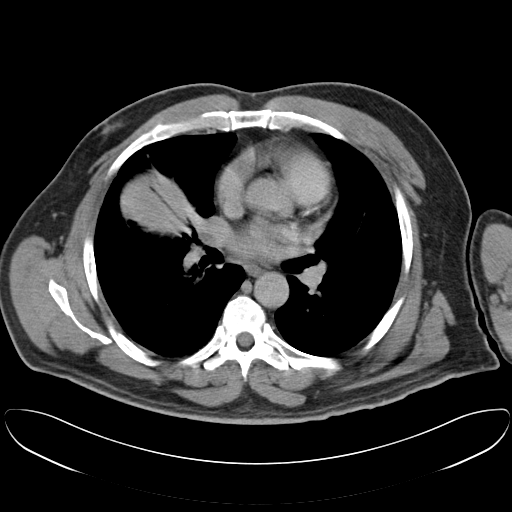
[im 105/112  lung]
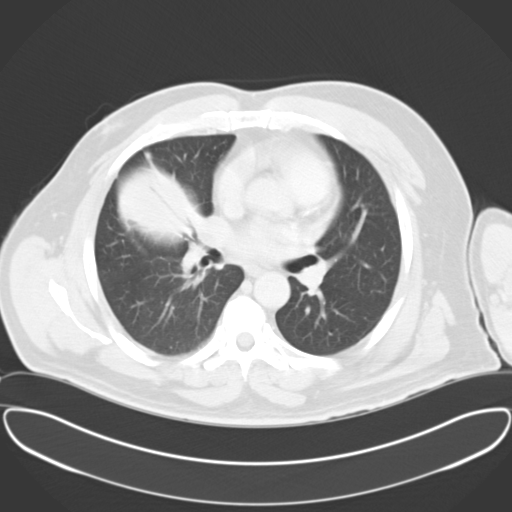

[14 of 32 positions shown; findings below may reference images not displayed]

FINDINGS: Atelectasis or consolidation in the right lung base.

Diffuse fatty infiltration of the liver. No focal liver lesions
identified. Surgical absence of the gallbladder. No bile duct
dilatation. The pancreas, spleen, adrenal glands, abdominal aorta,
inferior vena cava, and retroperitoneal lymph nodes are
unremarkable. Low-attenuation 15 mm lesion in the midportion of the
right kidney posteriorly appears to have been present previously,
and demonstrates low-attenuation Hounsfield units. This is likely a
cyst. The stomach, small bowel, and colon are not abnormally
distended. Diffusely stool-filled colon. No colonic or bowel wall
thickening is appreciated. No free air or free fluid in the abdomen.
Abdominal wall musculature appears intact.

Pelvis: Appendix is normal. Bladder wall is not thickened. Calcified
prostate gland with mild enlargement. No significant pelvic
lymphadenopathy. No free or loculated pelvic fluid collections.
Degenerative changes in the spine. No destructive bone lesions.
IMPRESSION: No acute process demonstrated in the abdomen or pelvis. No evidence
of bowel obstruction, inflammation, or perforation. Diffuse fatty
infiltration of the liver. Small cyst in the right kidney.

## 2017-08-18 ENCOUNTER — Encounter: Payer: Self-pay | Admitting: Urology

## 2017-08-18 LAB — CULTURE, URINE COMPREHENSIVE

## 2017-08-22 ENCOUNTER — Other Ambulatory Visit: Payer: Self-pay

## 2017-08-22 ENCOUNTER — Encounter
Admission: RE | Admit: 2017-08-22 | Discharge: 2017-08-22 | Disposition: A | Discharge status: Home / Self Care | Payer: Medicare Other | Source: Ambulatory Visit | Attending: Urology | Admitting: Urology

## 2017-08-22 DIAGNOSIS — Z01812 Encounter for preprocedural laboratory examination: Secondary | ICD-10-CM | POA: Insufficient documentation

## 2017-08-22 DIAGNOSIS — N201 Calculus of ureter: Secondary | ICD-10-CM | POA: Diagnosis not present

## 2017-08-22 DIAGNOSIS — Z01818 Encounter for other preprocedural examination: Secondary | ICD-10-CM | POA: Insufficient documentation

## 2017-08-22 LAB — BASIC METABOLIC PANEL
Anion gap: 12 (ref 5–15)
BUN: 11 mg/dL (ref 6–20)
CALCIUM: 8.9 mg/dL (ref 8.9–10.3)
CO2: 26 mmol/L (ref 22–32)
CREATININE: 0.88 mg/dL (ref 0.61–1.24)
Chloride: 99 mmol/L — ABNORMAL LOW (ref 101–111)
GFR calc non Af Amer: >60 mL/min (ref >60)
Glucose, Bld: 111 mg/dL — ABNORMAL HIGH (ref 65–99)
Potassium: 3.1 mmol/L — ABNORMAL LOW (ref 3.5–5.1)
Sodium: 137 mmol/L (ref 135–145)

## 2017-08-22 NOTE — Patient Instructions (Addendum)
Your procedure is scheduled on: 08/27/17 Tues Report to Same Day Surgery 2nd floor medical mall [facility] Entrance-take elevator on left to 2nd floor.  Check in with surgery information desk.) To find out your arrival time please call [phone] between 1PM - 3PM on 08/26/17 Mon  Remember: Instructions that are not followed completely may result in serious medical risk, up to and including death, or upon the discretion of your surgeon and anesthesiologist your surgery may need to be rescheduled.    _x___ 1. Do not eat food after midnight the night before your procedure. You may drink clear liquids up to 2 hours before you are scheduled to arrive at the hospital for your procedure.  Do not drink clear liquids within 2 hours of your scheduled arrival to the hospital.  Clear liquids include  --Water or Apple juice without pulp  --Clear carbohydrate beverage such as ClearFast or Gatorade  --Black Coffee or Clear Tea (No milk, no creamers, do not add anything to                  the coffee or Tea Type 1 and type 2 diabetics should only drink water.  No gum chewing or hard candies.     __x__ 2. No Alcohol for 24 hours before or after surgery.   __x__3. No Smoking or e-cigarettes for 24 prior to surgery.  Do not use any chewable tobacco products for at least 6 hour prior to surgery   ____  4. Bring all medications with you on the day of surgery if instructed.    __x__ 5. Notify your doctor if there is any change in your medical condition     (cold, fever, infections).    x___6. On the morning of surgery brush your teeth with toothpaste and water.  You may rinse your mouth with mouth wash if you wish.  Do not swallow any toothpaste or mouthwash.   Do not wear jewelry, make-up, hairpins, clips or nail polish.  Do not wear lotions, powders, or perfumes. You may wear deodorant.  Do not shave 48 hours prior to surgery. Men may shave face and neck.  Do not bring valuables to the hospital.     [facility] is not responsible for any belongings or valuables.               Contacts, dentures or bridgework may not be worn into surgery.  Leave your suitcase in the car. After surgery it may be brought to your room.  For patients admitted to the hospital, discharge time is determined by your                       treatment team.  _  Patients discharged the day of surgery will not be allowed to drive home.  You will need someone to drive you home and stay with you the night of your procedure.    Please read over the following fact sheets that you were given:   [facility] Preparing for Surgery and or MRSA Information   _x___ Take anti-hypertensive listed below, cardiac, seizure, asthma,     anti-reflux and psychiatric medicines. These include:  1. gabapentin (NEURONTIN) 300 MG capsule  2.oxyCODONE-acetaminophen (PERCOCET/ROXICET) 5-325 MG tablet  3.pregabalin (LYRICA) 25 MG capsule  4.ranitidine (ZANTAC) 150 MG tablet  5.  6.  ____Fleets enema or Magnesium Citrate as directed.   ____ Use CHG Soap or sage wipes as directed on instruction sheet  ____ Use inhalers on the day of surgery and bring to hospital day of surgery  ____ Stop Metformin and Janumet 2 days prior to surgery.    ____ Take 1/2 of usual insulin dose the night before surgery and none on the morning     surgery.   _x___ Follow recommendations from Cardiologist, Pulmonologist or PCP regarding          stopping Aspirin, Coumadin, Plavix ,Eliquis, Effient, or Pradaxa, and Pletal.  X____Stop Anti-inflammatories such as Advil, Aleve, Ibuprofen, Motrin, Naproxen, Naprosyn, Goodies powders or aspirin products. OK to take Tylenol and  Celebrex.  Stop Lodine until after surgery.   _x___ Stop supplements until after surgery.  But may continue Vitamin D, Vitamin B,       and multivitamin.   ____ Bring C-Pap to the hospital.

## 2017-08-23 ENCOUNTER — Other Ambulatory Visit: Payer: Self-pay | Admitting: Radiology

## 2017-08-23 DIAGNOSIS — E876 Hypokalemia: Secondary | ICD-10-CM

## 2017-08-23 MED ORDER — POTASSIUM CHLORIDE ER 10 MEQ PO TBCR: 10.0000 meq | EXTENDED_RELEASE_TABLET | Freq: Every day | ORAL | 0 refills | Status: DC

## 2017-08-23 NOTE — Progress Notes (Signed)
Order received for potassium chloride 68mEq daily x 7 days by Dr Bernardo Heater for potassium level of 3.1. Pt is aware of low potassium level & script sent to pharmacy & has no questions at this time.

## 2017-08-26 ENCOUNTER — Telehealth: Payer: Self-pay | Admitting: *Deleted

## 2017-08-26 MED ORDER — CEFAZOLIN SODIUM-DEXTROSE 2-4 GM/100ML-% IV SOLN
2.0000 g | INTRAVENOUS | Status: AC
  Administered 2017-08-27: 2 g via INTRAVENOUS

## 2017-08-26 NOTE — Telephone Encounter (Signed)
Attempted to call twice first time did not get through the que, 2nd time spoke with representative who was going to get [facility] for me and was placed on eternal hold.

## 2017-08-27 ENCOUNTER — Other Ambulatory Visit: Payer: Self-pay

## 2017-08-27 ENCOUNTER — Ambulatory Visit: Payer: Medicare Other | Admitting: Anesthesiology

## 2017-08-27 ENCOUNTER — Ambulatory Visit
Admission: RE | Admit: 2017-08-27 | Discharge: 2017-08-27 | Disposition: A | Discharge status: Home / Self Care | Payer: Medicare Other | Source: Ambulatory Visit | Attending: Urology | Admitting: Urology

## 2017-08-27 ENCOUNTER — Encounter: Payer: Self-pay | Admitting: Anesthesiology

## 2017-08-27 ENCOUNTER — Encounter
Admission: RE | Disposition: A | Discharge status: Home / Self Care | Payer: Self-pay | Source: Ambulatory Visit | Attending: Urology

## 2017-08-27 ENCOUNTER — Telehealth: Payer: Self-pay | Admitting: *Deleted

## 2017-08-27 DIAGNOSIS — Z7984 Long term (current) use of oral hypoglycemic drugs: Secondary | ICD-10-CM | POA: Diagnosis not present

## 2017-08-27 DIAGNOSIS — E119 Type 2 diabetes mellitus without complications: Secondary | ICD-10-CM | POA: Insufficient documentation

## 2017-08-27 DIAGNOSIS — I10 Essential (primary) hypertension: Secondary | ICD-10-CM | POA: Diagnosis not present

## 2017-08-27 DIAGNOSIS — J45909 Unspecified asthma, uncomplicated: Secondary | ICD-10-CM | POA: Diagnosis not present

## 2017-08-27 DIAGNOSIS — N201 Calculus of ureter: Secondary | ICD-10-CM

## 2017-08-27 DIAGNOSIS — Z79899 Other long term (current) drug therapy: Secondary | ICD-10-CM | POA: Diagnosis not present

## 2017-08-27 DIAGNOSIS — E785 Hyperlipidemia, unspecified: Secondary | ICD-10-CM | POA: Insufficient documentation

## 2017-08-27 DIAGNOSIS — Z87891 Personal history of nicotine dependence: Secondary | ICD-10-CM | POA: Insufficient documentation

## 2017-08-27 DIAGNOSIS — Z791 Long term (current) use of non-steroidal anti-inflammatories (NSAID): Secondary | ICD-10-CM | POA: Diagnosis not present

## 2017-08-27 DIAGNOSIS — K219 Gastro-esophageal reflux disease without esophagitis: Secondary | ICD-10-CM | POA: Insufficient documentation

## 2017-08-27 DIAGNOSIS — M1990 Unspecified osteoarthritis, unspecified site: Secondary | ICD-10-CM | POA: Diagnosis not present

## 2017-08-27 DIAGNOSIS — N200 Calculus of kidney: Secondary | ICD-10-CM

## 2017-08-27 HISTORY — PX: CYSTOSCOPY WITH URETEROSCOPY AND STENT PLACEMENT: SHX6377

## 2017-08-27 LAB — GLUCOSE, CAPILLARY
Glucose-Capillary: 136 mg/dL — ABNORMAL HIGH (ref 65–99)
Glucose-Capillary: 173 mg/dL — ABNORMAL HIGH (ref 65–99)

## 2017-08-27 LAB — POCT I-STAT 4, (NA,K, GLUC, HGB,HCT)
Glucose, Bld: 143 mg/dL — ABNORMAL HIGH (ref 65–99)
HEMATOCRIT: 53 % — AB (ref 39.0–52.0)
Hemoglobin: 18 g/dL — ABNORMAL HIGH (ref 13.0–17.0)
Potassium: 3.1 mmol/L — ABNORMAL LOW (ref 3.5–5.1)
Sodium: 139 mmol/L (ref 135–145)

## 2017-08-27 SURGERY — CYSTOURETEROSCOPY, WITH STENT INSERTION
Anesthesia: General | Site: Ureter | Laterality: Right | Wound class: Clean Contaminated

## 2017-08-27 MED ORDER — FENTANYL CITRATE (PF) 100 MCG/2ML IJ SOLN
INTRAMUSCULAR | Status: AC
  Administered 2017-08-27: 50 ug via INTRAVENOUS
  Filled 2017-08-27: qty 2

## 2017-08-27 MED ORDER — CEFAZOLIN SODIUM-DEXTROSE 2-4 GM/100ML-% IV SOLN
INTRAVENOUS | Status: AC
  Filled 2017-08-27: qty 100

## 2017-08-27 MED ORDER — HYDROMORPHONE HCL 1 MG/ML IJ SOLN
INTRAMUSCULAR | Status: AC
  Administered 2017-08-27: 0.5 mg via INTRAVENOUS
  Filled 2017-08-27: qty 1

## 2017-08-27 MED ORDER — IOTHALAMATE MEGLUMINE 43 % IV SOLN
INTRAVENOUS | Status: DC | PRN
  Administered 2017-08-27: 15 mL via URETHRAL

## 2017-08-27 MED ORDER — OXYCODONE HCL 5 MG PO TABS
ORAL_TABLET | ORAL | Status: AC
  Administered 2017-08-27: 5 mg via ORAL
  Filled 2017-08-27: qty 1

## 2017-08-27 MED ORDER — HYDROMORPHONE HCL 1 MG/ML IJ SOLN
0.2500 mg | INTRAMUSCULAR | Status: DC | PRN
  Administered 2017-08-27 (×4): 0.5 mg via INTRAVENOUS

## 2017-08-27 MED ORDER — LIDOCAINE HCL (CARDIAC) 20 MG/ML IV SOLN
INTRAVENOUS | Status: DC | PRN
  Administered 2017-08-27: 100 mg via INTRAVENOUS

## 2017-08-27 MED ORDER — PROPOFOL 10 MG/ML IV BOLUS
INTRAVENOUS | Status: DC | PRN
  Administered 2017-08-27: 200 mg via INTRAVENOUS

## 2017-08-27 MED ORDER — KETOROLAC TROMETHAMINE 30 MG/ML IJ SOLN
30.0000 mg | Freq: Once | INTRAMUSCULAR | Status: AC
  Administered 2017-08-27: 30 mg via INTRAVENOUS

## 2017-08-27 MED ORDER — LACTATED RINGERS IV SOLN
INTRAVENOUS | Status: DC | PRN
  Administered 2017-08-27: 11:00:00 via INTRAVENOUS

## 2017-08-27 MED ORDER — FENTANYL CITRATE (PF) 100 MCG/2ML IJ SOLN
INTRAMUSCULAR | Status: DC | PRN
  Administered 2017-08-27: 100 ug via INTRAVENOUS

## 2017-08-27 MED ORDER — ONDANSETRON HCL 4 MG/2ML IJ SOLN
INTRAMUSCULAR | Status: DC | PRN
  Administered 2017-08-27: 4 mg via INTRAVENOUS

## 2017-08-27 MED ORDER — FENTANYL CITRATE (PF) 100 MCG/2ML IJ SOLN
INTRAMUSCULAR | Status: AC
  Filled 2017-08-27: qty 2

## 2017-08-27 MED ORDER — KETOROLAC TROMETHAMINE 30 MG/ML IJ SOLN
INTRAMUSCULAR | Status: AC
  Filled 2017-08-27: qty 1

## 2017-08-27 MED ORDER — OXYBUTYNIN CHLORIDE 5 MG PO TABS: ORAL_TABLET | ORAL | 0 refills | Status: DC

## 2017-08-27 MED ORDER — OXYCODONE HCL 5 MG PO TABS
5.0000 mg | ORAL_TABLET | Freq: Once | ORAL | Status: AC | PRN
  Administered 2017-08-27: 5 mg via ORAL

## 2017-08-27 MED ORDER — SODIUM CHLORIDE 0.9 % IV SOLN
INTRAVENOUS | Status: DC
  Administered 2017-08-27: 11:00:00 via INTRAVENOUS

## 2017-08-27 MED ORDER — ROCURONIUM BROMIDE 100 MG/10ML IV SOLN
INTRAVENOUS | Status: DC | PRN
  Administered 2017-08-27: 40 mg via INTRAVENOUS
  Administered 2017-08-27: 10 mg via INTRAVENOUS

## 2017-08-27 MED ORDER — DEXAMETHASONE SODIUM PHOSPHATE 10 MG/ML IJ SOLN
INTRAMUSCULAR | Status: DC | PRN
  Administered 2017-08-27: 10 mg via INTRAVENOUS

## 2017-08-27 MED ORDER — SUGAMMADEX SODIUM 200 MG/2ML IV SOLN
INTRAVENOUS | Status: DC | PRN
  Administered 2017-08-27: 200 mg via INTRAVENOUS

## 2017-08-27 MED ORDER — OXYCODONE HCL 5 MG/5ML PO SOLN: 5.0000 mg | Freq: Once | ORAL | Status: AC | PRN

## 2017-08-27 MED ORDER — MIDAZOLAM HCL 2 MG/2ML IJ SOLN
INTRAMUSCULAR | Status: AC
  Filled 2017-08-27: qty 2

## 2017-08-27 MED ORDER — FENTANYL CITRATE (PF) 100 MCG/2ML IJ SOLN
25.0000 ug | INTRAMUSCULAR | Status: DC | PRN
  Administered 2017-08-27 (×4): 50 ug via INTRAVENOUS

## 2017-08-27 MED ORDER — OXYCODONE-ACETAMINOPHEN 5-325 MG PO TABS: 1.0000 | ORAL_TABLET | ORAL | 0 refills | Status: DC | PRN

## 2017-08-27 SURGICAL SUPPLY — 27 items
BAG DRAIN CYSTO-URO LG1000N (MISCELLANEOUS) ×3 IMPLANT
BASKET ZERO TIP 1.9FR (BASKET) ×3 IMPLANT
BRUSH SCRUB EZ 1% IODOPHOR (MISCELLANEOUS) ×3 IMPLANT
CATH URETL 5X70 OPEN END (CATHETERS) ×3 IMPLANT
CNTNR SPEC 2.5X3XGRAD LEK (MISCELLANEOUS) ×2
CONRAY 43 FOR UROLOGY 50M (MISCELLANEOUS) ×3 IMPLANT
CONT SPEC 4OZ STER OR WHT (MISCELLANEOUS) ×1
CONTAINER SPEC 2.5X3XGRAD LEK (MISCELLANEOUS) ×2 IMPLANT
DRAPE UTILITY 15X26 TOWEL STRL (DRAPES) ×3 IMPLANT
GLOVE BIO SURGEON STRL SZ8 (GLOVE) ×3 IMPLANT
GOWN STRL REUS W/ TWL LRG LVL3 (GOWN DISPOSABLE) ×4 IMPLANT
GOWN STRL REUS W/TWL LRG LVL3 (GOWN DISPOSABLE) ×2
GUIDEWIRE GREEN .038 145CM (MISCELLANEOUS) IMPLANT
INFUSOR MANOMETER BAG 3000ML (MISCELLANEOUS) ×3 IMPLANT
INTRODUCER DILATOR DOUBLE (INTRODUCER) ×3 IMPLANT
KIT TURNOVER CYSTO (KITS) ×3 IMPLANT
PACK CYSTO AR (MISCELLANEOUS) ×3 IMPLANT
SENSORWIRE 0.038 NOT ANGLED (WIRE) ×3
SET CYSTO W/LG BORE CLAMP LF (SET/KITS/TRAYS/PACK) ×3 IMPLANT
SHEATH URETERAL 12FRX35CM (MISCELLANEOUS) ×3 IMPLANT
SOL .9 NS 3000ML IRR  AL (IV SOLUTION) ×1
SOL .9 NS 3000ML IRR UROMATIC (IV SOLUTION) ×2 IMPLANT
STENT URET 6FRX24 CONTOUR (STENTS) ×3 IMPLANT
STENT URET 6FRX26 CONTOUR (STENTS) IMPLANT
SURGILUBE 2OZ TUBE FLIPTOP (MISCELLANEOUS) ×3 IMPLANT
WATER STERILE IRR 1000ML POUR (IV SOLUTION) ×3 IMPLANT
WIRE SENSOR 0.038 NOT ANGLED (WIRE) ×2 IMPLANT

## 2017-08-27 NOTE — OR Nursing (Signed)
RN contacted Dr. Bernardo Heater about pain prescription for patient to be discharged with per pain clinic clearance.  MD agreed.  Prescription was forwarded to pharmacy.

## 2017-08-27 NOTE — Anesthesia Postprocedure Evaluation (Signed)
Anesthesia Post Note  Patient: Howard Murphy  Procedure(s) Performed: CYSTOSCOPY WITH URETEROSCOPY AND STENT PLACEMENT (Right Ureter)  Patient location during evaluation: PACU Anesthesia Type: General Level of consciousness: awake and alert Pain management: pain level controlled Vital Signs Assessment: post-procedure vital signs reviewed and stable Respiratory status: spontaneous breathing, nonlabored ventilation, respiratory function stable and patient connected to nasal cannula oxygen Cardiovascular status: blood pressure returned to baseline and stable Postop Assessment: no apparent nausea or vomiting Anesthetic complications: no     Last Vitals:  Vitals:   08/27/17 1342 08/27/17 1358  BP: 125/88 137/89  Pulse: 90   Resp: 18   Temp: 36.7 C   SpO2: 94%     Last Pain:  Vitals:   08/27/17 1358  TempSrc:   PainSc: 7                  Precious Haws Aastha Dayley

## 2017-08-27 NOTE — Op Note (Signed)
Preoperative diagnosis:  1. Right nephrolithiasis  Postoperative diagnosis:  1. Right nephrolithiasis  Procedure: 1. Cystoscopy 2. Right ureteroscopy with stone extraction 3. Placement right ureteral stent 4. Right retrograde pyelogram with interpretation  Surgeon: Abbie Sons, MD  Anesthesia: General  Complications: None  Intraoperative findings:  1.  Ureteroscopic: 6 mm right lower calyceal calculus; 3 mm right upper calyceal calculus  2.  Retrograde pyelogram: Retrograde pyelogram post procedure demonstrates normal-appearing collecting system without hydronephrosis, filling defect or extravasation.  The ureter was normal in appearance.  EBL: Minimal  Specimens: None  Indication: AUGUSTINE LEVERETTE is a 62 y.o. patient with a 6 mm right renal pelvic calculus and intermittent flank pain.  After reviewing the management options for treatment, he elected to proceed with the above surgical procedure(s). We have discussed the potential benefits and risks of the procedure, side effects of the proposed treatment, the likelihood of the patient achieving the goals of the procedure, and any potential problems that might occur during the procedure or recuperation. Informed consent has been obtained.  Description of procedure:  The patient was taken to the operating room and general anesthesia was induced.  The patient was placed in the dorsal lithotomy position, prepped and draped in the usual sterile fashion, and preoperative antibiotics were administered. A preoperative time-out was performed.   A 22 [place] scope was lubricated and passed under direct vision.  The urethra was normal in caliber without stricture.  The prostate was remarkable for mild lateral lobe enlargement and moderate bladder neck elevation.  Panendoscopy was performed and the bladder mucosa was normal in appearance without erythema, solid or papillary lesions.  The ureteral orifices were normal appearing bilaterally  with clear efflux.  Attention was directed to the right ureteral orifice and a 0.038 Sensor wire was placed through the cystoscope and into the right ureter and advanced up to the renal pelvis under fluoroscopic guidance.  The cystoscope was removed and a dual-lumen catheter was placed over the wire and a second 0.038 working wire was placed.  The dual lumen catheter was removed.  A 45 cm ureteral access sheath was placed over the working wire without difficulty and advanced to the right proximal ureter.  The inner stylette and working wire were removed.  A dual channel digital ureteroscope was then placed through the access sheath and advanced into the right renal pelvis.  P all yeloscopy was performed with findings as described above.  The 6 mm calculus was crystalline in appearance and trapped with a 1.9 [place] nitinol basket however broke into 3 smaller pieces.  These were extracted with the basket without difficulty.  The 3 mm upper pole calculus was basketed and removed without difficulty.  Right retrograde pyelogram was performed with findings as described above.  Each calyx was examined and no significant calculi or fragments were identified.  The ureteroscope and access sheath were removed simultaneously and the proximal, mid and distal ureter were normal in appearance.  A 6 French/24 cm double-J ureteral stent was placed over the safety wire under fluoroscopic guidance without difficulty.  Adequate positioning was noted proximally and distally under fluoroscopy.  The stent was left attached with tether and will be removed by the patient in 6 days.  After anesthetic reversal he was transported to the PACU in stable condition.   Abbie Sons, MD  Abbie Sons, M.D.

## 2017-08-27 NOTE — OR Nursing (Signed)
RN contacted Dr. Bernardo Heater about patient's uncontrolled pain.  Provided report to MD.  RN received order to administer to patient of Toradol 30 mg (50mL=30mg ).

## 2017-08-27 NOTE — Interval H&P Note (Signed)
History and Physical Interval Note:  08/27/2017 11:04 AM  Howard Murphy  has presented today for surgery, with the diagnosis of right ureteral stone  The various methods of treatment have been discussed with the patient and family. After consideration of risks, benefits and other options for treatment, the patient has consented to  Procedure(s): CYSTOSCOPY/URETEROSCOPY/HOLMIUM LASER/STENT PLACEMENT (Right) as a surgical intervention .  The patient's history has been reviewed, patient examined, no change in status, stable for surgery.  I have reviewed the patient's chart and labs.  Questions were answered to the patient's satisfaction.     [place]

## 2017-08-27 NOTE — Anesthesia Post-op Follow-up Note (Signed)
Anesthesia QCDR form completed.        

## 2017-08-27 NOTE — Anesthesia Procedure Notes (Signed)
Procedure Name: Intubation Date/Time: 08/27/2017 11:28 AM Performed by: Leander Rams, CRNA Pre-anesthesia Checklist: Patient identified, Emergency Drugs available, Suction available, Patient being monitored and Timeout performed Patient Re-evaluated:Patient Re-evaluated prior to induction Oxygen Delivery Method: Circle system utilized Preoxygenation: Pre-oxygenation with 100% oxygen Induction Type: IV induction Ventilation: Mask ventilation with difficulty and Oral airway inserted - appropriate to patient size Laryngoscope Size: McGraph and 3 Grade View: Grade I Tube size: 7.5 mm Number of attempts: 1 Airway Equipment and Method: Stylet Placement Confirmation: ETT inserted through vocal cords under direct vision,  positive ETCO2 and breath sounds checked- equal and bilateral Secured at: 23 cm Tube secured with: Tape Dental Injury: Teeth and Oropharynx as per pre-operative assessment  Difficulty Due To: Difficulty was anticipated

## 2017-08-27 NOTE — Anesthesia Preprocedure Evaluation (Addendum)
Anesthesia Evaluation  Patient identified by MRN, date of birth, ID band Patient awake    Reviewed: Allergy & Precautions, H&P , NPO status , Patient's Chart, lab work & pertinent test results  History of Anesthesia Complications Negative for: history of anesthetic complications  Airway Mallampati: III  TM Distance: <3 FB Neck ROM: limited    Dental  (+) Chipped   Pulmonary shortness of breath and with exertion, asthma ,           Cardiovascular Exercise Tolerance: Good hypertension, (-) angina(-) Past MI and (-) DOE      Neuro/Psych  Headaches,  Neuromuscular disease negative psych ROS   GI/Hepatic Neg liver ROS, GERD  Medicated and Controlled,  Endo/Other  diabetes, Type 2  Renal/GU Renal disease     Musculoskeletal  (+) Arthritis ,   Abdominal   Peds  Hematology negative hematology ROS (+)   Anesthesia Other Findings Signs and symptoms suggestive of sleep apnea   Past Medical History: No date: Arthritis     Comment:  Osteo vs rheumatoid ? 05/05/2013: Back ache No date: Diabetes mellitus without complication (HCC) No date: GERD (gastroesophageal reflux disease) No date: Headache No date: Hyperlipemia No date: Hypertension No date: Kidney stones No date: Kidney stones No date: Nephrolithiasis No date: Shortness of breath dyspnea  Past Surgical History: 2015: BACK SURGERY     Comment:  x3 08/09/2016: BACK SURGERY 2000: CHOLECYSTECTOMY 04/11/2015: COLONOSCOPY WITH PROPOFOL; N/A     Comment:  Procedure: COLONOSCOPY WITH PROPOFOL;  Surgeon: Manya Silvas, MD;  Location: [place];  Service:               Endoscopy;  Laterality: N/A; 04/11/2015: ESOPHAGOGASTRODUODENOSCOPY (EGD) WITH PROPOFOL; N/A     Comment:  Procedure: ESOPHAGOGASTRODUODENOSCOPY (EGD) WITH               PROPOFOL;  Surgeon: Manya Silvas, MD;  Location: [facility]              ENDOSCOPY;  Service: Endoscopy;   Laterality: N/A; 2016: SHOULDER SURGERY; Right 06/2015: SHOULDER SURGERY; Left  BMI    Body Mass Index:  30.54 kg/m      Reproductive/Obstetrics negative OB ROS                             Anesthesia Physical Anesthesia Plan  ASA: III  Anesthesia Plan: General ETT   Post-op Pain Management:    Induction: Intravenous  PONV Risk Score and Plan: Ondansetron, Dexamethasone, Midazolam and Treatment may vary due to age or medical condition  Airway Management Planned: Oral ETT and Video Laryngoscope Planned  Additional Equipment:   Intra-op Plan:   Post-operative Plan: Extubation in OR  Informed Consent: I have reviewed the patients History and Physical, chart, labs and discussed the procedure including the risks, benefits and alternatives for the proposed anesthesia with the patient or authorized representative who has indicated his/her understanding and acceptance.   Dental Advisory Given  Plan Discussed with: Anesthesiologist, CRNA and Surgeon  Anesthesia Plan Comments: (Patient consented for risks of anesthesia including but not limited to:  - adverse reactions to medications - damage to teeth, lips or other oral mucosa - sore throat or hoarseness - Damage to heart, brain, lungs or loss of life  Patient voiced understanding.)        Anesthesia Quick Evaluation

## 2017-08-27 NOTE — Transfer of Care (Signed)
Immediate Anesthesia Transfer of Care Note  Patient: Howard Murphy  Procedure(s) Performed: CYSTOSCOPY WITH URETEROSCOPY AND STENT PLACEMENT (Right Ureter)  Patient Location: PACU  Anesthesia Type:General  Level of Consciousness: awake  Airway & Oxygen Therapy: Patient Spontanous Breathing  Post-op Assessment: Report given to RN  Post vital signs: stable  Last Vitals:  Vitals:   08/27/17 1009  BP: 119/89  Pulse: 76  Resp: 16  Temp: 36.5 C  SpO2: 97%    Last Pain:  Vitals:   08/27/17 1009  TempSrc: Oral         Complications: No apparent anesthesia complications

## 2017-08-28 ENCOUNTER — Telehealth: Payer: Self-pay

## 2017-08-28 ENCOUNTER — Encounter: Payer: Self-pay | Admitting: Urology

## 2017-08-28 NOTE — Telephone Encounter (Signed)
Pt called stating he has been really nauseous since surgery and requested a medication. Please advise.

## 2017-08-28 NOTE — Telephone Encounter (Signed)
Contacted pharmacy, there is no prescription that needs PA. Contacted Howard Murphy, asking for medication for nausea due to kidney stone. Advised to contact urologist for this.

## 2017-08-29 MED ORDER — ONDANSETRON HCL 8 MG PO TABS: 8.0000 mg | ORAL_TABLET | Freq: Three times a day (TID) | ORAL | 0 refills | Status: DC | PRN

## 2017-08-29 NOTE — Telephone Encounter (Signed)
- 

## 2017-08-30 LAB — STONE ANALYSIS
CA PHOS CRY STONE QL IR: 10 %
Ca Oxalate,Dihydrate: 20 %
Ca Oxalate,Monohydr.: 70 %
Stone Weight KSTONE: 20 mg

## 2017-09-01 ENCOUNTER — Other Ambulatory Visit: Payer: Self-pay

## 2017-09-01 ENCOUNTER — Emergency Department
Admission: EM | Admit: 2017-09-01 | Discharge: 2017-09-02 | Disposition: A | Discharge status: Home / Self Care | Payer: Medicare Other | Attending: Emergency Medicine | Admitting: Emergency Medicine

## 2017-09-01 ENCOUNTER — Encounter: Payer: Self-pay | Admitting: Emergency Medicine

## 2017-09-01 DIAGNOSIS — N132 Hydronephrosis with renal and ureteral calculous obstruction: Secondary | ICD-10-CM | POA: Diagnosis not present

## 2017-09-01 DIAGNOSIS — Z79899 Other long term (current) drug therapy: Secondary | ICD-10-CM | POA: Diagnosis not present

## 2017-09-01 DIAGNOSIS — N200 Calculus of kidney: Secondary | ICD-10-CM | POA: Insufficient documentation

## 2017-09-01 DIAGNOSIS — E119 Type 2 diabetes mellitus without complications: Secondary | ICD-10-CM | POA: Insufficient documentation

## 2017-09-01 DIAGNOSIS — R1031 Right lower quadrant pain: Secondary | ICD-10-CM | POA: Diagnosis present

## 2017-09-01 DIAGNOSIS — I10 Essential (primary) hypertension: Secondary | ICD-10-CM | POA: Insufficient documentation

## 2017-09-01 NOTE — ED Triage Notes (Signed)
Pt had surgery for kidney stone on 08/27/17; stent placed; pt says he pulled the stent yesterday; pt says he's been having pain since the procedure that has never stopped; pain is worse now to right lower quadrant than it was before the surgery; pt says he's voiding in small amounts and did have frank hematuria earlier today; pt restless in triage;

## 2017-09-02 ENCOUNTER — Emergency Department: Payer: Medicare Other

## 2017-09-02 ENCOUNTER — Telehealth: Payer: Self-pay | Admitting: Urology

## 2017-09-02 DIAGNOSIS — N132 Hydronephrosis with renal and ureteral calculous obstruction: Secondary | ICD-10-CM | POA: Diagnosis not present

## 2017-09-02 LAB — CBC
HCT: 55.4 % — ABNORMAL HIGH (ref 40.0–52.0)
Hemoglobin: 18.1 g/dL — ABNORMAL HIGH (ref 13.0–18.0)
MCH: 29 pg (ref 26.0–34.0)
MCHC: 32.6 g/dL (ref 32.0–36.0)
MCV: 88.8 fL (ref 80.0–100.0)
PLATELETS: 303 10*3/uL (ref 150–440)
RBC: 6.23 MIL/uL — ABNORMAL HIGH (ref 4.40–5.90)
RDW: 13.8 % (ref 11.5–14.5)
WBC: 9.7 10*3/uL (ref 3.8–10.6)

## 2017-09-02 LAB — COMPREHENSIVE METABOLIC PANEL
ALT: 41 U/L (ref 17–63)
AST: 35 U/L (ref 15–41)
Albumin: 4.1 g/dL (ref 3.5–5.0)
Alkaline Phosphatase: 52 U/L (ref 38–126)
Anion gap: 12 (ref 5–15)
BUN: 21 mg/dL — ABNORMAL HIGH (ref 6–20)
CO2: 24 mmol/L (ref 22–32)
CREATININE: 1.07 mg/dL (ref 0.61–1.24)
Calcium: 9.3 mg/dL (ref 8.9–10.3)
Chloride: 99 mmol/L — ABNORMAL LOW (ref 101–111)
Glucose, Bld: 205 mg/dL — ABNORMAL HIGH (ref 65–99)
Potassium: 3.1 mmol/L — ABNORMAL LOW (ref 3.5–5.1)
Sodium: 135 mmol/L (ref 135–145)
TOTAL PROTEIN: 8 g/dL (ref 6.5–8.1)
Total Bilirubin: 0.4 mg/dL (ref 0.3–1.2)

## 2017-09-02 LAB — URINALYSIS, COMPLETE (UACMP) WITH MICROSCOPIC
BILIRUBIN URINE: NEGATIVE
Bacteria, UA: NONE SEEN
GLUCOSE, UA: 50 mg/dL — AB
Ketones, ur: NEGATIVE mg/dL
NITRITE: NEGATIVE
PH: 5 (ref 5.0–8.0)
Protein, ur: 100 mg/dL — AB
SPECIFIC GRAVITY, URINE: 1.021 (ref 1.005–1.030)
Squamous Epithelial / LPF: NONE SEEN

## 2017-09-02 MED ORDER — SODIUM CHLORIDE 0.9 % IV BOLUS (SEPSIS)
1000.0000 mL | Freq: Once | INTRAVENOUS | Status: AC
  Administered 2017-09-02: 1000 mL via INTRAVENOUS

## 2017-09-02 MED ORDER — ONDANSETRON HCL 4 MG/2ML IJ SOLN
4.0000 mg | Freq: Once | INTRAMUSCULAR | Status: AC
  Administered 2017-09-02: 4 mg via INTRAVENOUS
  Filled 2017-09-02: qty 2

## 2017-09-02 MED ORDER — ONDANSETRON 8 MG PO TBDP: 8.0000 mg | ORAL_TABLET | Freq: Three times a day (TID) | ORAL | 0 refills | Status: DC | PRN

## 2017-09-02 MED ORDER — MORPHINE SULFATE (PF) 4 MG/ML IV SOLN
4.0000 mg | Freq: Once | INTRAVENOUS | Status: AC
  Administered 2017-09-02: 4 mg via INTRAVENOUS
  Filled 2017-09-02: qty 1

## 2017-09-02 MED ORDER — KETOROLAC TROMETHAMINE 30 MG/ML IJ SOLN
30.0000 mg | Freq: Once | INTRAMUSCULAR | Status: AC
  Administered 2017-09-02: 30 mg via INTRAVENOUS
  Filled 2017-09-02: qty 1

## 2017-09-02 MED ORDER — OXYCODONE-ACETAMINOPHEN 5-325 MG PO TABS
1.0000 | ORAL_TABLET | ORAL | 0 refills | Status: DC | PRN
Start: 2017-09-02 — End: 2017-09-17

## 2017-09-02 MED ORDER — TAMSULOSIN HCL 0.4 MG PO CAPS: 0.4000 mg | ORAL_CAPSULE | Freq: Every day | ORAL | 0 refills | Status: DC

## 2017-09-02 MED ORDER — HYDROMORPHONE HCL 1 MG/ML IJ SOLN
1.0000 mg | Freq: Once | INTRAMUSCULAR | Status: AC
  Administered 2017-09-02: 1 mg via INTRAVENOUS
  Filled 2017-09-02: qty 1

## 2017-09-02 NOTE — ED Notes (Signed)
Pt updated on delay, pt verbalizes understanding.

## 2017-09-02 NOTE — Telephone Encounter (Signed)
Pt fiance called office stating ER states pt needs to be seen by [facility] ASAP for "huge" kidney stone.  Pt has appt on 4/18, please advise.  Thank you.

## 2017-09-02 NOTE — ED Provider Notes (Signed)
[facility] ([facility]) Emergency Department Provider Note ________________   First MD Initiated Contact with Patient 09/01/17 2356     (approximate)  I have reviewed the triage vital signs and the nursing notes.   HISTORY  Chief Complaint Abdominal Pain    HPI Howard Murphy is a 62 y.o. male with below list of chronic medical conditions including recent right UPJ kidney stone status post stent placement and removal yesterday presents to the emergency department 10 out of 10 right pelvic/back pain.  Patient also admits to hematuria.  Patient denies any dysuria or fever.   Past Medical History:  Diagnosis Date  . 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  . Back ache 05/05/2013  . Diabetes mellitus without complication ([place])   . GERD (gastroesophageal reflux disease)   . Headache   . Hyperlipemia   . Hypertension   . Kidney stones   . Kidney stones   . Nephrolithiasis   . Shortness of breath dyspnea     Patient Active Problem List   Diagnosis Date Noted  . Acute postoperative pain 03/28/2017  . Medication monitoring encounter 03/07/2017  . 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 Spondylolisthesis at L3-L4 level 08/03/2016  . Chronic pain syndrome 07/09/2016  . Encounter for medication monitoring 02/24/2016  . Cervical spondylosis 12/17/2015  . Cervical facet arthropathy 12/17/2015  . Chronic neck pain (Bilateral) (R>L) 12/17/2015  . Cervical facet syndrome (Bilateral) (R>L) 12/17/2015  . Chronic upper extremity pain (Bilateral) (R>L) 12/17/2015  . Cervical foraminal stenosis 12/17/2015  .  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 Arthropathy of shoulder (Right) 12/17/2015  . Osteoarthritis 12/17/2015  . 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 Lumbar foraminal stenosis (L4-5) (Left) 12/17/2015  . Grade 1 Anterolisthesis (2 mm) of L4 over  L5. 12/17/2015  . Lumbar facet (L4-5) synovial cyst (6 mm) (Left) 12/17/2015  . Chronic sacroiliac joint pain (Bilateral) (L>R) 12/08/2015  . Neurogenic pain 12/08/2015  . Abnormal MRI, lumbar spine 11/21/2015  . Radicular pain of shoulder (Right) 10/03/2015  . Carpal tunnel syndrome (Bilateral) 10/03/2015  . Chronic cervical radicular pain (Right) 10/03/2015  . Chronic low back pain (Primary Source of Pain) (Bilateral) (L>R) 09/06/2015  . Opiate use (60 MME/Day) 09/05/2015  . Long term prescription opiate use 09/05/2015  . Long term current use of opiate analgesic 09/05/2015  . Spondylosis of lumbar spine 09/05/2015  . Lumbar facet joint syndrome ([place]) 09/05/2015  . Chronic hip pain (Left) 09/05/2015  . Diabetic peripheral neuropathy ([place]) 09/05/2015  . Chronic lumbar radicular pain (S1 Dermatome) (Bilateral) (L>R) 09/05/2015  . Hypokalemia 09/05/2015  . Chronic shoulder pain (Primary Source of Pain) (Bilateral) (L>R) 09/05/2015  . Diabetes ([place]) 09/05/2015  . Chronic foot pain (Secondary source of pain) (Bilateral) (L>R) 09/05/2015  . Chronic hand pain [facility] source of pain) (Bilateral) (R>L) 09/05/2015  . Lumbar facet hypertrophy 09/05/2015  . Encounter for therapeutic drug level monitoring 09/05/2015  . Low testosterone 09/05/2015  . Occipital pain (Right) 09/05/2015  . 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 Hypertriglyceridemia 07/26/2015  . Chronic abdominal pain (RUQ) 07/14/2015  . Type 2 diabetes mellitus with peripheral neuropathy ([place]) 07/14/2015  . Chest pain 07/14/2015  . Complete tear of the shoulder rotator cuff (Left) 05/10/2015  . Complete tear of left rotator cuff 05/10/2015  . SOB (shortness of breath) 03/22/2015  . Elevated hemoglobin ([place]) 07/07/2013  .  History of lumbar facet Synovial cyst, surgically removed. 05/29/2013    Class: History of  . 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 GERD (gastroesophageal reflux disease) 05/05/2013  . Benign  prostatic hyperplasia 05/05/2013  . Hypertension 05/08/2011  . Hyperlipidemia 05/08/2011  . RA (rheumatoid arthritis) ([place]) 05/08/2011    Past Surgical History:  Procedure Laterality Date  . BACK SURGERY  2015   x3  . BACK SURGERY  08/09/2016  . CHOLECYSTECTOMY  2000  . COLONOSCOPY WITH PROPOFOL N/A 04/11/2015   Procedure: COLONOSCOPY WITH PROPOFOL;  Surgeon: Manya Silvas, MD;  Location: [facility] ENDOSCOPY;  Service: Endoscopy;  Laterality: N/A;  . CYSTOSCOPY WITH URETEROSCOPY AND STENT PLACEMENT Right 08/27/2017   Procedure: CYSTOSCOPY WITH URETEROSCOPY AND STENT PLACEMENT;  Surgeon: Abbie Sons, MD;  Location: [facility] ORS;  Service: Urology;  Laterality: Right;  . ESOPHAGOGASTRODUODENOSCOPY (EGD) WITH PROPOFOL N/A 04/11/2015   Procedure: ESOPHAGOGASTRODUODENOSCOPY (EGD) WITH PROPOFOL;  Surgeon: Manya Silvas, MD;  Location: [facility] ENDOSCOPY;  Service: Endoscopy;  Laterality: N/A;  . SHOULDER SURGERY Right 2016  . SHOULDER SURGERY Left 06/2015    Prior to Admission medications   Medication Sig Start Date End Date Taking? Authorizing Provider  acetaminophen (TYLENOL) 500 MG tablet Take 1,000 mg by mouth every 6 (six) hours as needed (for pain.).    [provider]  albuterol (PROVENTIL HFA;VENTOLIN HFA) 108 (90 Base) MCG/ACT inhaler Inhale 2 puffs into the lungs every 6 (six) hours as needed for wheezing or shortness of breath. 05/29/17   Fisher, Linden Dolin, PA-C  atorvastatin (LIPITOR) 40 MG tablet Take 1 tablet (40 mg total) by mouth at bedtime. 03/08/17   Arnetha Courser, MD  chlorthalidone (HYGROTON) 50 MG tablet Take 50 mg by mouth daily.    [provider]  gabapentin (NEURONTIN) 300 MG capsule Take 300 mg by mouth 3 (three) times daily.    [provider]  lisinopril (PRINIVIL,ZESTRIL) 20 MG tablet Take 1 tablet (20 mg total) by mouth daily. Reported on 12/08/2015 03/08/17   Arnetha Courser, MD  ondansetron (ZOFRAN) 8 MG tablet Take 1 tablet (8 mg total) by  mouth every 8 (eight) hours as needed for nausea or vomiting. 08/29/17   Stoioff, Ronda Fairly, MD  oxybutynin (DITROPAN) 5 MG tablet 1 tab tid prn frequency,urgency, bladder spasm 08/27/17   Stoioff, Ronda Fairly, MD  oxyCODONE-acetaminophen (PERCOCET/ROXICET) 5-325 MG tablet Take 1 tablet by mouth every 4 (four) hours as needed for severe pain. 08/27/17   Stoioff, Ronda Fairly, MD  potassium chloride (K-DUR) 10 MEQ tablet Take 1 tablet (10 mEq total) by mouth daily. 08/23/17   Stoioff, Ronda Fairly, MD  pregabalin (LYRICA) 25 MG capsule Take 25 mg by mouth 2 (two) times daily.    [provider]  ranitidine (ZANTAC) 150 MG tablet Take 150 mg by mouth daily.    [provider]  sitaGLIPtin (JANUVIA) 50 MG tablet Take 1 tablet (50 mg total) by mouth daily. 03/08/17   Arnetha Courser, MD  tamsulosin (FLOMAX) 0.4 MG CAPS capsule Take 1 capsule (0.4 mg total) by mouth daily after supper. 08/12/17   Merlyn Lot, MD    Allergies No known drug allergies  Family History  Problem Relation Age of Onset  . Cancer Mother        Breast CA  . Stroke Father   . Heart disease Father   . Cancer Sister   . Cancer Maternal Grandmother   . Cancer Sister   . Bladder  Cancer Neg Hx   . Prostate cancer Neg Hx   . Kidney cancer Neg Hx     Social History Social History   Tobacco Use  . Smoking status: Never Smoker  . Smokeless tobacco: Former Systems developer    Types: Chew  Substance Use Topics  . Alcohol use: No    Frequency: Never  . Drug use: No    Review of Systems Constitutional: No fever/chills Eyes: No visual changes. ENT: No sore throat. Cardiovascular: Denies chest pain. Respiratory: Denies shortness of breath. Gastrointestinal: No abdominal pain.  No nausea, no vomiting.  No diarrhea.  No constipation. Genitourinary: Negative for dysuria.  Positive for hematuria Musculoskeletal: Negative for neck pain.  Positive for back pain. Integumentary: Negative for rash. Neurological: Negative for  headaches, focal weakness or numbness.   ____________________________________________   PHYSICAL EXAM:  VITAL SIGNS: ED Triage Vitals  Enc Vitals Group     BP 09/01/17 2337 (!) 168/97     Pulse Rate 09/01/17 2337 91     Resp 09/01/17 2337 17     Temp 09/01/17 2337 98.5 F (36.9 C)     Temp Source 09/01/17 2337 Oral     SpO2 09/01/17 2337 95 %     Weight 09/01/17 2337 88.5 kg (195 lb)     Height 09/01/17 2337 1.702 m (5\' 7" )     Head Circumference --      Peak Flow --      Pain Score 09/02/17 0046 8     Pain Loc --      Pain Edu? --      Excl. in [place]? --     Constitutional: Alert and oriented.  Apparent discomfort.   Eyes: Conjunctivae are normal. Head: Atraumatic. Mouth/Throat: Mucous membranes are moist.  Oropharynx non-erythematous. Neck: No stridor.   Cardiovascular: Normal rate, regular rhythm. Good peripheral circulation. Grossly normal heart sounds. Respiratory: Normal respiratory effort.  No retractions. Lungs CTAB. Gastrointestinal: Soft and nontender. No distention.  Musculoskeletal: No lower extremity tenderness nor edema. No gross deformities of extremities. Neurologic:  Normal speech and language. No gross focal neurologic deficits are appreciated.  Skin:  Skin is warm, dry and intact. No rash noted. Psychiatric: Mood and affect are normal. Speech and behavior are normal.  ____________________________________________   LABS (all labs ordered are listed, but only abnormal results are displayed)  Labs Reviewed  CBC - Abnormal; Notable for the following components:      Result Value   RBC 6.23 (*)    Hemoglobin 18.1 (*)    HCT 55.4 (*)    All other components within normal limits  COMPREHENSIVE METABOLIC PANEL - Abnormal; Notable for the following components:   Potassium 3.1 (*)    Chloride 99 (*)    Glucose, Bld 205 (*)    BUN 21 (*)    All other components within normal limits  URINALYSIS, COMPLETE (UACMP) WITH MICROSCOPIC - Abnormal; Notable for  the following components:   Color, Urine YELLOW (*)    APPearance CLOUDY (*)    Glucose, UA 50 (*)    Hgb urine dipstick LARGE (*)    Protein, ur 100 (*)    Leukocytes, UA TRACE (*)    All other components within normal limits   ____________________________________________  EKG  ED ECG REPORT I, Lluveras N Franko Hilliker, the attending physician, personally viewed and interpreted this ECG.   Date: 09/02/2017  EKG Time: 12:16 AM  Rate: 76  Rhythm: Normal sinus rhythm  Axis: Normal  Intervals: Normal  ST&T Change: None  ____________________________________________  RADIOLOGY I, Franklin N Janele Lague, personally viewed and evaluated these images (plain radiographs) as part of my medical decision making, as well as reviewing the written report by the radiologist.    Official radiology report(s): Ct Renal Stone Study  Result Date: 09/02/2017 CLINICAL DATA:  62 year old male with recent surgery for kidney stone on 08/27/2017 and stent placement. Patient told stent yesterday and has been having pain since the procedure. EXAM: CT ABDOMEN AND PELVIS WITHOUT CONTRAST TECHNIQUE: Multidetector CT imaging of the abdomen and pelvis was performed following the standard protocol without IV contrast. COMPARISON:  CT of the abdomen pelvis dated 08/12/2017 FINDINGS: Evaluation of this exam is limited in the absence of intravenous contrast. Lower chest: There is mild eventration of the right hemidiaphragm with right lung base subsegmental atelectasis. No intra-abdominal free air or free fluid. Hepatobiliary: Mild fatty infiltration of the liver. No intrahepatic biliary ductal dilatation. Cholecystectomy. Pancreas: Unremarkable. No pancreatic ductal dilatation or surrounding inflammatory changes. Spleen: Normal in size without focal abnormality. Adrenals/Urinary Tract: The adrenal glands are unremarkable. There is a mild right hydronephrosis, increased compared to the prior CT. There has been interval removal of the  previously seen stone in the right renal pelvis. Faint punctate high attenuating content in the right UPJ as well as in the distal right ureter at the ureterovesical junction may represent a tiny stone fragment. There is mild right hydroureter with stranding of the periureteric fat. A 4 mm nonobstructing linear stone is seen in the inferior pole of the right kidney. The left kidney is unremarkable. The left ureter and urinary bladder are grossly unremarkable. Stomach/Bowel: Small scattered sigmoid diverticula without active inflammatory changes. There is no bowel obstruction or active inflammation. The appendix contains high attenuating content, likely small appendicoliths otherwise unremarkable. Vascular/Lymphatic: Mild aortoiliac atherosclerotic disease. No portal venous gas. There is no adenopathy. Reproductive: The prostate and seminal vesicles are grossly unremarkable. Other: Small fat containing left inguinal hernia as well as a small fat containing umbilical hernia. No fluid collection. Musculoskeletal: Degenerative changes of the spine. L3-L5 disc spacer and posterior fusion hardware. No acute osseous pathology. IMPRESSION: Interval removal of the previously seen stone in the right renal pelvis. Faint punctate high attenuating material in the right ureteropelvic junction as well as right UVJ likely residual stone fragments. A 4 mm nonobstructing and linear right renal inferior pole stone is also seen. There has been interval development of a mild right hydronephrosis compared to the prior CT. Electronically Signed   By: Anner Crete M.D.   On: 09/02/2017 01:29    Procedures   ____________________________________________   INITIAL IMPRESSION / ASSESSMENT AND PLAN / ED COURSE  As part of my medical decision making, I reviewed the following data within the electronic MEDICAL RECORD NUMBER   62 year old male presented with above-stated history and physical exam with concern for possible urethral  spasm versus new ureterolithiasis.  CT scan revealed a 4 mm nonobstructing linear right inferior renal pole stone.  Patient given IV morphine in the emergency department x2 doses as well as Toradol with improvement of pain.  Patient advised to follow-up with Dr. Noland Fordyce of ____________________________________________  FINAL CLINICAL IMPRESSION(S) / ED DIAGNOSES  Final diagnoses:  Kidney stone     MEDICATIONS GIVEN DURING THIS VISIT:  Medications  morphine 4 MG/ML injection 4 mg (4 mg Intravenous Given 09/02/17 0009)  ondansetron (ZOFRAN) injection 4 mg (4 mg Intravenous Given 09/02/17 0009)  sodium chloride 0.9 %  bolus 1,000 mL (0 mLs Intravenous Stopped 09/02/17 0144)  HYDROmorphone (DILAUDID) injection 1 mg (1 mg Intravenous Given 09/02/17 0047)  ketorolac (TORADOL) 30 MG/ML injection 30 mg (30 mg Intravenous Given 09/02/17 0321)  morphine 4 MG/ML injection 4 mg (4 mg Intravenous Given 09/02/17 0321)     ED Discharge Orders    None       Note:  This document was prepared using Dragon voice recognition software and may include unintentional dictation errors.    Gregor Hams, MD 09/02/17 [phone]

## 2017-09-02 NOTE — ED Notes (Signed)
Patient transported to CT 

## 2017-09-02 NOTE — Telephone Encounter (Signed)
The chart was reviewed.  He developed right renal colic after stent removal and was seen in the ED early this morning.  His CT was reviewed.  He has hydronephrosis and hydroureter down to the bladder and feel this is most likely related to edema as I do not see any significant stone fragments.  I discussed options with him.  He was informed this most likely will resolve within 48-72 hours and he can be managed conservatively with tamsulosin and oral analgesics.  I also discussed cystoscopy with replacement of his stent.  He has initially elected the former.  Prescriptions for oxycodone, Zofran and tamsulosin were sent to his pharmacy.  He was instructed to call for worsening/persistent pain or development of fever greater than 101 degrees.

## 2017-09-02 NOTE — ED Notes (Signed)
Pt placed on oxygen at 2lpm before administration of morphine for pox of 92% on ra. Pt does complain of shob, head of bed raised, pt reports improved shob after raising of hob.

## 2017-09-02 NOTE — ED Notes (Signed)
Pt complains of severe rlq pain and right flank pain. Spouse with pt states pt has felt faint. Pt arrives diaphoretic to room, skin normal color warm and dry. Pt complains of intermittent shob, denies known fever. Urine has a foul odor and is amber/pink and cloudy in color.

## 2017-09-02 NOTE — ED Notes (Signed)
Pt appears significantly more comfortable. Pt updated on wait time for results of ct scan.

## 2017-09-03 ENCOUNTER — Telehealth: Payer: Self-pay | Admitting: *Deleted

## 2017-09-03 NOTE — Telephone Encounter (Signed)
Pharmacy notifying our office that patient received from Dr. Elenor Quinones Percocet 5/325mg  #30 on 08-27-17, and Percocet 5/325 #15 today.

## 2017-09-17 ENCOUNTER — Ambulatory Visit (INDEPENDENT_AMBULATORY_CARE_PROVIDER_SITE_OTHER): Payer: Medicare Other | Admitting: Urology

## 2017-09-17 ENCOUNTER — Encounter: Payer: Self-pay | Admitting: Urology

## 2017-09-17 VITALS — BP 138/88 | HR 96 | Resp 16 | Ht 67.0 in | Wt 194.1 lb

## 2017-09-17 DIAGNOSIS — N1330 Unspecified hydronephrosis: Secondary | ICD-10-CM | POA: Diagnosis not present

## 2017-09-17 MED ORDER — TAMSULOSIN HCL 0.4 MG PO CAPS: 0.4000 mg | ORAL_CAPSULE | Freq: Every day | ORAL | 0 refills | Status: DC

## 2017-09-17 MED ORDER — OXYCODONE-ACETAMINOPHEN 5-325 MG PO TABS: 1.0000 | ORAL_TABLET | ORAL | 0 refills | Status: DC | PRN

## 2017-09-17 NOTE — Progress Notes (Signed)
09/17/2017 1:32 PM   Howard Murphy [birth year] [id number]  Referring provider: Center, [facility] [place],  [id number]  Chief Complaint  Patient presents with  . Follow-up    HPI: 62 year old male with a history of a 6 mm right renal pelvic stone with renal colic.  He underwent right ureteroscopy on 3/19.  His renal pelvic calculus had migrated to the lower pole.  He underwent basketing of both calculi.  The stones were crystalline and broke up easily with the basket.  He had a ureteral stent placed which she removed on 3/25.  He states he had persistent pain while the stent was indwelling however had increasing pain at the stent was removed and was seen in the ED on 3/25.  A noncontrast CT was performed which showed faint punctate material at the UPJ and UVJ with mild right hydronephrosis and hydroureter.  This was thought to be most likely due to ureteral edema and he has been managed conservatively.  He still complains of right lower quadrant abdominal pain.  He has occasional nausea and is using Zofran.  He denies fever, chills or gross hematuria.   PMH: Past Medical History:  Diagnosis Date  . 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  . Back ache 05/05/2013  . Diabetes mellitus without complication ([place])   . GERD (gastroesophageal reflux disease)   . Headache   . Hyperlipemia   . Hypertension   . Kidney stones   . Kidney stones   . Nephrolithiasis   . Shortness of breath dyspnea     Surgical History: Past Surgical History:  Procedure Laterality Date  . BACK SURGERY  2015   x3  . BACK SURGERY  08/09/2016  . CHOLECYSTECTOMY  2000  . COLONOSCOPY WITH PROPOFOL N/A 04/11/2015   Procedure: COLONOSCOPY WITH PROPOFOL;  Surgeon: Manya Silvas, MD;  Location: [facility] ENDOSCOPY;  Service: Endoscopy;  Laterality: N/A;  . CYSTOSCOPY WITH URETEROSCOPY AND STENT PLACEMENT Right 08/27/2017   Procedure: CYSTOSCOPY WITH URETEROSCOPY AND STENT PLACEMENT;   Surgeon: Abbie Sons, MD;  Location: [facility] ORS;  Service: Urology;  Laterality: Right;  . ESOPHAGOGASTRODUODENOSCOPY (EGD) WITH PROPOFOL N/A 04/11/2015   Procedure: ESOPHAGOGASTRODUODENOSCOPY (EGD) WITH PROPOFOL;  Surgeon: Manya Silvas, MD;  Location: [facility] ENDOSCOPY;  Service: Endoscopy;  Laterality: N/A;  . SHOULDER SURGERY Right 2016  . SHOULDER SURGERY Left 06/2015    Home Medications:  Allergies as of 09/17/2017   No Known Allergies     Medication List        Accurate as of 09/17/17  1:32 PM. Always use your most recent med list.          acetaminophen 500 MG tablet Commonly known as:  TYLENOL Take 1,000 mg by mouth every 6 (six) hours as needed (for pain.).   albuterol 108 (90 Base) MCG/ACT inhaler Commonly known as:  PROVENTIL HFA;VENTOLIN HFA Inhale 2 puffs into the lungs every 6 (six) hours as needed for wheezing or shortness of breath.   atorvastatin 40 MG tablet Commonly known as:  LIPITOR Take 1 tablet (40 mg total) by mouth at bedtime.   chlorthalidone 50 MG tablet Commonly known as:  HYGROTON Take 50 mg by mouth daily.   gabapentin 300 MG capsule Commonly known as:  NEURONTIN Take 300 mg by mouth 3 (three) times daily.   lisinopril 20 MG tablet Commonly known as:  PRINIVIL,ZESTRIL Take 1 tablet (20 mg total) by mouth daily. Reported on 12/08/2015  ondansetron 8 MG disintegrating tablet Commonly known as:  ZOFRAN-ODT Take 1 tablet (8 mg total) by mouth every 8 (eight) hours as needed for nausea or vomiting.   oxyCODONE-acetaminophen 5-325 MG tablet Commonly known as:  PERCOCET/ROXICET Take 1 tablet by mouth every 4 (four) hours as needed for severe pain.   potassium chloride 10 MEQ tablet Commonly known as:  K-DUR Take 1 tablet (10 mEq total) by mouth daily.   pregabalin 25 MG capsule Commonly known as:  LYRICA Take 25 mg by mouth 2 (two) times daily.   ranitidine 150 MG tablet Commonly known as:  ZANTAC Take 150 mg by mouth daily.     sitaGLIPtin 50 MG tablet Commonly known as:  JANUVIA Take 1 tablet (50 mg total) by mouth daily.   tamsulosin 0.4 MG Caps capsule Commonly known as:  FLOMAX Take 1 capsule (0.4 mg total) by mouth daily after supper.       Allergies: No Known Allergies  Family History: Family History  Problem Relation Age of Onset  . Cancer Mother        Breast CA  . Stroke Father   . Heart disease Father   . Cancer Sister   . Cancer Maternal Grandmother   . Cancer Sister   . Bladder Cancer Neg Hx   . Prostate cancer Neg Hx   . Kidney cancer Neg Hx     Social History:  reports that he has never smoked. He quit smokeless tobacco use about 4 years ago. His smokeless tobacco use included chew. He reports that he does not drink alcohol or use drugs.  ROS: UROLOGY Frequent Urination?: No Hard to postpone urination?: No Burning/pain with urination?: No Get up at night to urinate?: No Leakage of urine?: No Urine stream starts and stops?: No Trouble starting stream?: No Do you have to strain to urinate?: No Blood in urine?: No Urinary tract infection?: No Sexually transmitted disease?: No Injury to kidneys or bladder?: No Painful intercourse?: No Weak stream?: No Erection problems?: No Penile pain?: No  Gastrointestinal Nausea?: Yes Vomiting?: No Indigestion/heartburn?: No Diarrhea?: No Constipation?: No  Constitutional Fever: No Night sweats?: No Weight loss?: No Fatigue?: Yes  Skin Skin rash/lesions?: No Itching?: No  Eyes Blurred vision?: Yes Double vision?: No  Ears/Nose/Throat Sore throat?: No Sinus problems?: No  Hematologic/Lymphatic Swollen glands?: No Easy bruising?: No  Cardiovascular Leg swelling?: No Chest pain?: No  Respiratory Cough?: No Shortness of breath?: No  Endocrine Excessive thirst?: No  Musculoskeletal Back pain?: Yes Joint pain?: Yes  Neurological Headaches?: No Dizziness?: Yes  Psychologic Depression?: No Anxiety?:  No  Physical Exam: BP 138/88   Pulse 96   Resp 16   Ht 5\' 7"  (1.702 m)   Wt 194 lb 1.6 oz (88 kg)   SpO2 98%   BMI 30.40 kg/m   Constitutional:  Alert and oriented, No acute distress. HEENT: [place] AT, moist mucus membranes.  Trachea midline, no masses. Cardiovascular: No clubbing, cyanosis, or edema. RRR Respiratory: Normal respiratory effort, no increased work of breathing. Clear. GI: Abdomen is soft, nontender, nondistended, no abdominal masses GU: No CVA tenderness Lymph: No cervical or inguinal lymphadenopathy. Skin: No rashes, bruises or suspicious lesions. Neurologic: Grossly intact, no focal deficits, moving all 4 extremities. Psychiatric: Normal mood and affect.  Laboratory Data: Lab Results  Component Value Date   WBC 9.7 09/02/2017   HGB 18.1 (H) 09/02/2017   HCT 55.4 (H) 09/02/2017   MCV 88.8 09/02/2017   PLT 303 09/02/2017  Lab Results  Component Value Date   CREATININE 1.07 09/02/2017    Lab Results  Component Value Date   HGBA1C 6.9 (H) 03/07/2017     Pertinent Imaging:  Results for orders placed during the hospital encounter of 08/27/17  Abdomen 1 view (KUB)   Narrative CLINICAL DATA:  Right kidney stone.  EXAM: ABDOMEN - 1 VIEW  COMPARISON:  CT abdomen pelvis dated August 12, 2017.  FINDINGS: Unchanged 6 mm calculus projecting over the right renal shadow, likely corresponding to the calculus in the renal pelvis on CT. No additional calculi are identified. Prostate calcifications in phleboliths in the pelvis. Nonobstructive bowel gas pattern. No acute osseous abnormality. Prior lower lumbar fusion.  IMPRESSION: 6 mm calculus projecting over the right renal shadow, likely corresponding to the calculus in the renal pelvis seen on recent CT.   Electronically Signed   By: Titus Dubin M.D.   On: 08/27/2017 09:31               Results for orders placed during the hospital encounter of 09/01/17  CT Renal Stone Study   Narrative  CLINICAL DATA:  62 year old male with recent surgery for kidney stone on 08/27/2017 and stent placement. Patient told stent yesterday and has been having pain since the procedure.  EXAM: CT ABDOMEN AND PELVIS WITHOUT CONTRAST  TECHNIQUE: Multidetector CT imaging of the abdomen and pelvis was performed following the standard protocol without IV contrast.  COMPARISON:  CT of the abdomen pelvis dated 08/12/2017  FINDINGS: Evaluation of this exam is limited in the absence of intravenous contrast.  Lower chest: There is mild eventration of the right hemidiaphragm with right lung base subsegmental atelectasis.  No intra-abdominal free air or free fluid.  Hepatobiliary: Mild fatty infiltration of the liver. No intrahepatic biliary ductal dilatation. Cholecystectomy.  Pancreas: Unremarkable. No pancreatic ductal dilatation or surrounding inflammatory changes.  Spleen: Normal in size without focal abnormality.  Adrenals/Urinary Tract: The adrenal glands are unremarkable. There is a mild right hydronephrosis, increased compared to the prior CT. There has been interval removal of the previously seen stone in the right renal pelvis. Faint punctate high attenuating content in the right UPJ as well as in the distal right ureter at the ureterovesical junction may represent a tiny stone fragment. There is mild right hydroureter with stranding of the periureteric fat. A 4 mm nonobstructing linear stone is seen in the inferior pole of the right kidney. The left kidney is unremarkable. The left ureter and urinary bladder are grossly unremarkable.  Stomach/Bowel: Small scattered sigmoid diverticula without active inflammatory changes. There is no bowel obstruction or active inflammation. The appendix contains high attenuating content, likely small appendicoliths otherwise unremarkable.  Vascular/Lymphatic: Mild aortoiliac atherosclerotic disease. No portal venous gas. There is no  adenopathy.  Reproductive: The prostate and seminal vesicles are grossly unremarkable.  Other: Small fat containing left inguinal hernia as well as a small fat containing umbilical hernia. No fluid collection.  Musculoskeletal: Degenerative changes of the spine. L3-L5 disc spacer and posterior fusion hardware. No acute osseous pathology.  IMPRESSION: Interval removal of the previously seen stone in the right renal pelvis. Faint punctate high attenuating material in the right ureteropelvic junction as well as right UVJ likely residual stone fragments. A 4 mm nonobstructing and linear right renal inferior pole stone is also seen. There has been interval development of a mild right hydronephrosis compared to the prior CT.   Electronically Signed   By: Laren Everts.D.  On: 09/02/2017 01:29     Assessment & Plan:   62 year old male with persistent renal colic status post ureteroscopy and subsequent stent removal.  Will obtain a follow-up CT.  Percocet and tamsulosin were refilled.  He will be notified with results and further recommendations.   Abbie Sons, [place] [address] [place], [place] [address] [phone]

## 2017-09-23 ENCOUNTER — Ambulatory Visit
Admission: RE | Admit: 2017-09-23 | Discharge: 2017-09-23 | Disposition: A | Discharge status: Home / Self Care | Payer: Medicare Other | Source: Ambulatory Visit | Attending: Urology | Admitting: Urology

## 2017-09-23 DIAGNOSIS — N1330 Unspecified hydronephrosis: Secondary | ICD-10-CM | POA: Diagnosis not present

## 2017-09-23 DIAGNOSIS — N200 Calculus of kidney: Secondary | ICD-10-CM | POA: Diagnosis not present

## 2017-09-26 ENCOUNTER — Ambulatory Visit: Payer: Medicare Other | Admitting: Urology

## 2017-09-26 ENCOUNTER — Telehealth: Payer: Self-pay

## 2017-09-26 ENCOUNTER — Encounter: Payer: Self-pay | Admitting: Urology

## 2017-09-26 NOTE — Telephone Encounter (Signed)
-----   Message from Abbie Sons, MD sent at 09/26/2017  8:25 AM EDT ----- F/U CT shows resolution of his kidney blockage and no stones.  I do not see a urologic cause of his pain.  Is he still hurting?

## 2017-09-26 NOTE — Telephone Encounter (Signed)
Patient notified, he is still having pain will follow up with PCP

## 2017-10-23 ENCOUNTER — Encounter: Payer: Medicare Other | Admitting: Nurse Practitioner

## 2017-10-31 ENCOUNTER — Other Ambulatory Visit: Payer: Self-pay

## 2017-10-31 ENCOUNTER — Ambulatory Visit: Payer: Medicare Other | Attending: Nurse Practitioner | Admitting: Nurse Practitioner

## 2017-10-31 ENCOUNTER — Encounter: Payer: Self-pay | Admitting: Nurse Practitioner

## 2017-10-31 VITALS — BP 133/85 | HR 86 | Temp 97.5°F | Resp 16 | Ht 67.0 in | Wt 197.0 lb

## 2017-10-31 DIAGNOSIS — G8929 Other chronic pain: Secondary | ICD-10-CM

## 2017-10-31 DIAGNOSIS — R1011 Right upper quadrant pain: Secondary | ICD-10-CM | POA: Insufficient documentation

## 2017-10-31 DIAGNOSIS — M79602 Pain in left arm: Secondary | ICD-10-CM | POA: Insufficient documentation

## 2017-10-31 DIAGNOSIS — M79672 Pain in left foot: Secondary | ICD-10-CM | POA: Diagnosis not present

## 2017-10-31 DIAGNOSIS — M79604 Pain in right leg: Secondary | ICD-10-CM | POA: Insufficient documentation

## 2017-10-31 DIAGNOSIS — M79601 Pain in right arm: Secondary | ICD-10-CM | POA: Diagnosis not present

## 2017-10-31 DIAGNOSIS — M545 Low back pain, unspecified: Secondary | ICD-10-CM

## 2017-10-31 DIAGNOSIS — M79641 Pain in right hand: Secondary | ICD-10-CM | POA: Diagnosis not present

## 2017-10-31 DIAGNOSIS — M19011 Primary osteoarthritis, right shoulder: Secondary | ICD-10-CM | POA: Diagnosis not present

## 2017-10-31 DIAGNOSIS — M25552 Pain in left hip: Secondary | ICD-10-CM | POA: Insufficient documentation

## 2017-10-31 DIAGNOSIS — M47896 Other spondylosis, lumbar region: Secondary | ICD-10-CM | POA: Diagnosis not present

## 2017-10-31 DIAGNOSIS — Z79899 Other long term (current) drug therapy: Secondary | ICD-10-CM | POA: Insufficient documentation

## 2017-10-31 DIAGNOSIS — M19012 Primary osteoarthritis, left shoulder: Secondary | ICD-10-CM | POA: Diagnosis not present

## 2017-10-31 DIAGNOSIS — E1142 Type 2 diabetes mellitus with diabetic polyneuropathy: Secondary | ICD-10-CM | POA: Insufficient documentation

## 2017-10-31 DIAGNOSIS — M533 Sacrococcygeal disorders, not elsewhere classified: Secondary | ICD-10-CM | POA: Insufficient documentation

## 2017-10-31 DIAGNOSIS — N400 Enlarged prostate without lower urinary tract symptoms: Secondary | ICD-10-CM | POA: Insufficient documentation

## 2017-10-31 DIAGNOSIS — M4316 Spondylolisthesis, lumbar region: Secondary | ICD-10-CM | POA: Diagnosis not present

## 2017-10-31 DIAGNOSIS — G894 Chronic pain syndrome: Secondary | ICD-10-CM | POA: Diagnosis not present

## 2017-10-31 DIAGNOSIS — M79605 Pain in left leg: Secondary | ICD-10-CM | POA: Diagnosis not present

## 2017-10-31 DIAGNOSIS — M48061 Spinal stenosis, lumbar region without neurogenic claudication: Secondary | ICD-10-CM | POA: Insufficient documentation

## 2017-10-31 DIAGNOSIS — G5603 Carpal tunnel syndrome, bilateral upper limbs: Secondary | ICD-10-CM | POA: Insufficient documentation

## 2017-10-31 DIAGNOSIS — M79642 Pain in left hand: Secondary | ICD-10-CM | POA: Insufficient documentation

## 2017-10-31 DIAGNOSIS — I10 Essential (primary) hypertension: Secondary | ICD-10-CM | POA: Insufficient documentation

## 2017-10-31 DIAGNOSIS — Z79891 Long term (current) use of opiate analgesic: Secondary | ICD-10-CM | POA: Diagnosis not present

## 2017-10-31 DIAGNOSIS — M79671 Pain in right foot: Secondary | ICD-10-CM | POA: Diagnosis not present

## 2017-10-31 DIAGNOSIS — Z5181 Encounter for therapeutic drug level monitoring: Secondary | ICD-10-CM | POA: Insufficient documentation

## 2017-10-31 DIAGNOSIS — K219 Gastro-esophageal reflux disease without esophagitis: Secondary | ICD-10-CM | POA: Insufficient documentation

## 2017-10-31 DIAGNOSIS — E781 Pure hyperglyceridemia: Secondary | ICD-10-CM | POA: Insufficient documentation

## 2017-10-31 DIAGNOSIS — M47892 Other spondylosis, cervical region: Secondary | ICD-10-CM | POA: Insufficient documentation

## 2017-10-31 DIAGNOSIS — M8938 Hypertrophy of bone, other site: Secondary | ICD-10-CM | POA: Diagnosis not present

## 2017-10-31 DIAGNOSIS — M7138 Other bursal cyst, other site: Secondary | ICD-10-CM | POA: Insufficient documentation

## 2017-10-31 DIAGNOSIS — M069 Rheumatoid arthritis, unspecified: Secondary | ICD-10-CM | POA: Insufficient documentation

## 2017-10-31 DIAGNOSIS — M47816 Spondylosis without myelopathy or radiculopathy, lumbar region: Secondary | ICD-10-CM | POA: Diagnosis not present

## 2017-10-31 DIAGNOSIS — R51 Headache: Secondary | ICD-10-CM | POA: Insufficient documentation

## 2017-10-31 MED ORDER — OXYCODONE HCL 10 MG PO TABS: 10.0000 mg | ORAL_TABLET | Freq: Four times a day (QID) | ORAL | 0 refills | Status: DC | PRN

## 2017-10-31 NOTE — Patient Instructions (Addendum)
____________________________________________________________________________________________  Medication Rules  Applies to: All patients receiving prescriptions (written or electronic).  Pharmacy of record: Pharmacy where electronic prescriptions will be sent. If written prescriptions are taken to a different pharmacy, please inform the nursing staff. The pharmacy listed in the electronic medical record should be the one where you would like electronic prescriptions to be sent.  Prescription refills: Only during scheduled appointments. Applies to both, written and electronic prescriptions.  NOTE: The following applies primarily to controlled substances (Opioid* Pain Medications).   Patient's responsibilities: 1. Pain Pills: Bring all pain pills to every appointment (except for procedure appointments). 2. Pill Bottles: Bring pills in original pharmacy bottle. Always bring newest bottle. Bring bottle, even if empty. 3. Medication refills: You are responsible for knowing and keeping track of what medications you need refilled. The day before your appointment, write a list of all prescriptions that need to be refilled. Bring that list to your appointment and give it to the admitting nurse. Prescriptions will be written only during appointments. If you forget a medication, it will not be "Called in", "Faxed", or "electronically sent". You will need to get another appointment to get these prescribed. 4. Prescription Accuracy: You are responsible for carefully inspecting your prescriptions before leaving our office. Have the discharge nurse carefully go over each prescription with you, before taking them home. Make sure that your name is accurately spelled, that your address is correct. Check the name and dose of your medication to make sure it is accurate. Check the number of pills, and the written instructions to make sure they are clear and accurate. Make sure that you are given enough medication to last  until your next medication refill appointment. 5. Taking Medication: Take medication as prescribed. Never take more pills than instructed. Never take medication more frequently than prescribed. Taking less pills or less frequently is permitted and encouraged, when it comes to controlled substances (written prescriptions).  6. Inform other Doctors: Always inform, all of your healthcare providers, of all the medications you take. 7. Pain Medication from other Providers: You are not allowed to accept any additional pain medication from any other Doctor or Healthcare provider. There are two exceptions to this rule. (see below) In the event that you require additional pain medication, you are responsible for notifying us, as stated below. 8. Medication Agreement: You are responsible for carefully reading and following our Medication Agreement. This must be signed before receiving any prescriptions from our practice. Safely store a copy of your signed Agreement. Violations to the Agreement will result in no further prescriptions. (Additional copies of our Medication Agreement are available upon request.) 9. Laws, Rules, & Regulations: All patients are expected to follow all Federal and State Laws, Statutes, Rules, & Regulations. Ignorance of the Laws does not constitute a valid excuse. The use of any illegal substances is prohibited. 10. Adopted CDC guidelines & recommendations: Target dosing levels will be at or below 60 MME/day. Use of benzodiazepines** is not recommended.  Exceptions: There are only two exceptions to the rule of not receiving pain medications from other Healthcare Providers. 1. Exception #1 (Emergencies): In the event of an emergency (i.e.: accident requiring emergency care), you are allowed to receive additional pain medication. However, you are responsible for: As soon as you are able, call our office [phone], at any time of the day or night, and leave a message stating your name, the  date and nature of the emergency, and the name and dose of the medication   prescribed. In the event that your call is answered by a member of our staff, make sure to document and save the date, time, and the name of the person that took your information.  2. Exception #2 (Planned Surgery): In the event that you are scheduled by another doctor or dentist to have any type of surgery or procedure, you are allowed (for a period no longer than 30 days), to receive additional pain medication, for the acute post-op pain. However, in this case, you are responsible for picking up a copy of our "Post-op Pain Management for Surgeons" handout, and giving it to your surgeon or dentist. This document is available at our office, and does not require an appointment to obtain it. Simply go to our office during business hours (Monday-Thursday from 8:00 AM to 4:00 PM) (Friday 8:00 AM to 12:00 Noon) or if you have a scheduled appointment with [place], prior to your surgery, and ask for it by name. In addition, you will need to provide [place] with your name, name of your surgeon, type of surgery, and date of procedure or surgery.  *Opioid medications include: morphine, codeine, oxycodone, oxymorphone, hydrocodone, hydromorphone, meperidine, tramadol, tapentadol, buprenorphine, fentanyl, methadone. **Benzodiazepine medications include: diazepam (Valium), alprazolam (Xanax), clonazepam (Klonopine), lorazepam (Ativan), clorazepate (Tranxene), chlordiazepoxide (Librium), estazolam (Prosom), oxazepam (Serax), temazepam (Restoril), triazolam (Halcion) (Last updated: 08/08/2017) ____________________________________________________________________________________________   BMI Assessment: Estimated body mass index is 30.85 kg/m as calculated from the following:   Height as of this encounter: 5\' 7"  (1.702 m).   Weight as of this encounter: 197 lb (89.4 kg).  BMI interpretation table: BMI level Category Range association with higher  incidence of chronic pain  <18 kg/m2 Underweight   18.5-24.9 kg/m2 Ideal body weight   25-29.9 kg/m2 Overweight Increased incidence by 20%  30-34.9 kg/m2 Obese (Class I) Increased incidence by 68%  35-39.9 kg/m2 Severe obesity (Class II) Increased incidence by 136%  >40 kg/m2 Extreme obesity (Class III) Increased incidence by 254%   Patient's current BMI Ideal Body weight  Body mass index is 30.85 kg/m. Ideal body weight: 66.1 kg (145 lb 11.6 oz) Adjusted ideal body weight: 75.4 kg (166 lb 3.8 oz)   BMI Readings from Last 4 Encounters:  10/31/17 30.85 kg/m  09/17/17 30.40 kg/m  09/01/17 30.54 kg/m  08/27/17 30.54 kg/m   Wt Readings from Last 4 Encounters:  10/31/17 197 lb (89.4 kg)  09/17/17 194 lb 1.6 oz (88 kg)  09/01/17 195 lb (88.5 kg)  08/27/17 195 lb (88.5 kg)   ____________________________________________________________________________________________  General Risks and Possible Complications  Patient Responsibilities: It is important that you read this as it is part of your informed consent. It is our duty to inform you of the risks and possible complications associated with treatments offered to you. It is your responsibility as a patient to read this and to ask questions about anything that is not clear or that you believe was not covered in this document.  Patient's Rights: You have the right to refuse treatment. You also have the right to change your mind, even after initially having agreed to have the treatment done. However, under this last option, if you wait until the last second to change your mind, you may be charged for the materials used up to that point.  Introduction: Medicine is not an Chief Strategy Officer. Everything in Medicine, including the lack of treatment(s), carries the potential for danger, harm, or loss (which is by definition: Risk). In Medicine, a complication is a secondary problem,  condition, or disease that can aggravate an already existing one.  All treatments carry the risk of possible complications. The fact that a side effects or complications occurs, does not imply that the treatment was conducted incorrectly. It must be clearly understood that these can happen even when everything is done following the highest safety standards.  No treatment: You can choose not to proceed with the proposed treatment alternative. The "PRO(s)" would include: avoiding the risk of complications associated with the therapy. The "CON(s)" would include: not getting any of the treatment benefits. These benefits fall under one of three categories: diagnostic; therapeutic; and/or palliative. Diagnostic benefits include: getting information which can ultimately lead to improvement of the disease or symptom(s). Therapeutic benefits are those associated with the successful treatment of the disease. Finally, palliative benefits are those related to the decrease of the primary symptoms, without necessarily curing the condition (example: decreasing the pain from a flare-up of a chronic condition, such as incurable terminal cancer).  General Risks and Complications: These are associated to most interventional treatments. They can occur alone, or in combination. They fall under one of the following six (6) categories: no benefit or worsening of symptoms; bleeding; infection; nerve damage; allergic reactions; and/or death. 1. No benefits or worsening of symptoms: In Medicine there are no guarantees, only probabilities. No healthcare provider can ever guarantee that a medical treatment will work, they can only state the probability that it may. Furthermore, there is always the possibility that the condition may worsen, either directly, or indirectly, as a consequence of the treatment. 2. Bleeding: This is more common if the patient is taking a blood thinner, either prescription or over the counter (example: Goody Powders, Fish oil, Aspirin, Garlic, etc.), or if suffering a condition  associated with impaired coagulation (example: Hemophilia, cirrhosis of the liver, low platelet counts, etc.). However, even if you do not have one on these, it can still happen. If you have any of these conditions, or take one of these drugs, make sure to notify your treating physician. 3. Infection: This is more common in patients with a compromised immune system, either due to disease (example: diabetes, cancer, human immunodeficiency virus [HIV], etc.), or due to medications or treatments (example: therapies used to treat cancer and rheumatological diseases). However, even if you do not have one on these, it can still happen. If you have any of these conditions, or take one of these drugs, make sure to notify your treating physician. 4. Nerve Damage: This is more common when the treatment is an invasive one, but it can also happen with the use of medications, such as those used in the treatment of cancer. The damage can occur to small secondary nerves, or to large primary ones, such as those in the spinal cord and brain. This damage may be temporary or permanent and it may lead to impairments that can range from temporary numbness to permanent paralysis and/or brain death. 5. Allergic Reactions: Any time a substance or material comes in contact with our body, there is the possibility of an allergic reaction. These can range from a mild skin rash (contact dermatitis) to a severe systemic reaction (anaphylactic reaction), which can result in death. 6. Death: In general, any medical intervention can result in death, most of the time due to an unforeseen complication. ____________________________________________________________________________________________  Radiofrequency Lesioning Radiofrequency lesioning is a procedure that is performed to relieve pain. The procedure is often used for back, neck, or arm pain. Radiofrequency lesioning involves the use  of a machine that creates radio waves to make heat.  During the procedure, the heat is applied to the nerve that carries the pain signal. The heat damages the nerve and interferes with the pain signal. Pain relief usually starts about 2 weeks after the procedure and lasts for 6 months to 1 year. Tell a health care provider about:  Any allergies you have.  All medicines you are taking, including vitamins, herbs, eye drops, creams, and over-the-counter medicines.  Any problems you or family members have had with anesthetic medicines.  Any blood disorders you have.  Any surgeries you have had.  Any medical conditions you have.  Whether you are pregnant or may be pregnant. What are the risks? Generally, this is a safe procedure. However, problems may occur, including:  Pain or soreness at the injection site.  Infection at the injection site.  Damage to nerves or blood vessels.  What happens before the procedure?  Ask your health care provider about: ? Changing or stopping your regular medicines. This is especially important if you are taking diabetes medicines or blood thinners. ? Taking medicines such as aspirin and ibuprofen. These medicines can thin your blood. Do not take these medicines before your procedure if your health care provider instructs you not to.  Follow instructions from your health care provider about eating or drinking restrictions.  Plan to have someone take you home after the procedure.  If you go home right after the procedure, plan to have someone with you for 24 hours. What happens during the procedure?  You will be given one or more of the following: ? A medicine to help you relax (sedative). ? A medicine to numb the area (local anesthetic).  You will be awake during the procedure. You will need to be able to talk with the health care provider during the procedure.  With the help of a type of X-ray (fluoroscopy), the health care provider will insert a radiofrequency needle into the area to be  treated.  Next, a wire that carries the radio waves (electrode) will be put through the radiofrequency needle. An electrical pulse will be sent through the electrode to verify the correct nerve. You will feel a tingling sensation, and you may have muscle twitching.  Then, the tissue that is around the needle tip will be heated by an electric current that is passed using the radiofrequency machine. This will numb the nerves.  A bandage (dressing) will be put on the insertion area after the procedure is done. The procedure may vary among health care providers and hospitals. What happens after the procedure?  Your blood pressure, heart rate, breathing rate, and blood oxygen level will be monitored often until the medicines you were given have worn off.  Return to your normal activities as directed by your health care provider. This information is not intended to replace advice given to you by your health care provider. Make sure you discuss any questions you have with your health care provider. Document Released: 01/24/2011 Document Revised: 11/03/2015 Document Reviewed: 07/05/2014 Elsevier Interactive Patient Education  [employer].

## 2017-10-31 NOTE — Progress Notes (Signed)
Nursing Pain Medication Assessment:  Safety precautions to be maintained throughout the outpatient stay will include: orient to surroundings, keep bed in low position, maintain call bell within reach at all times, provide assistance with transfer out of bed and ambulation.  Medication Inspection Compliance: Pill count conducted under aseptic conditions, in front of the patient. Neither the pills nor the bottle was removed from the patient's sight at any time. Once count was completed pills were immediately returned to the patient in their original bottle.  Medication: oxycodone 10 mg Pill/Patch Count: 0 of 120 pills remain Pill/Patch Appearance: Markings consistent with prescribed medication Bottle Appearance: Standard pharmacy container. Clearly labeled. Filled Date: 4 / 20 / 2019 Last Medication intake:  Yesterday

## 2017-10-31 NOTE — Progress Notes (Signed)
Patient's Name: Howard Murphy  MRN: [id number]  Referring Provider: [facility], [place]: [birth year]  PCP: [facility], [place]: 10/31/2017  Note by: Vevelyn Francois NP  Service setting: Ambulatory outpatient  Specialty: Interventional Pain Management  Location: [facility] (AMB) Pain Management Facility    Patient type: Established    Primary Reason(s) for Visit: Encounter for prescription drug management. (Level of risk: moderate)  CC: Back Pain (lower) and Leg Pain (bilaterally)  HPI  Howard Murphy is a 62 y.o. year old, male patient, who comes today for a medication management evaluation. He has Benign fibroma of prostate; Elevated hemoglobin (HCC); GERD (gastroesophageal reflux disease); Hypertension; Hyperlipidemia; History of lumbar facet Synovial cyst, surgically removed.; RA (rheumatoid arthritis) ([place]); SOB (shortness of breath); Chronic abdominal pain (RUQ); Type 2 diabetes mellitus with peripheral neuropathy ([place]); Chest pain; Hypertriglyceridemia; Opiate use (60 MME/Day); Long term prescription opiate use; Long term current use of opiate analgesic; Spondylosis of lumbar spine; Lumbar facet joint syndrome ([place]); Chronic hip pain (Left); Diabetic peripheral neuropathy ([place]); Chronic lumbar radicular pain (S1 Dermatome) (Bilateral) (L>R); Hypokalemia; Complete tear of the shoulder rotator cuff (Left); Chronic shoulder pain (Primary Source of Pain) (Bilateral) (L>R); Diabetes ([place]); Chronic foot pain (Secondary source of pain) (Bilateral) (L>R); Chronic hand pain [facility] source of pain) (Bilateral) (R>L); Lumbar facet hypertrophy; Encounter for therapeutic drug level monitoring; Low testosterone; Occipital pain (Right); Failed back surgical syndrome 4; Chronic low back pain (Primary Source of Pain) (Bilateral) (L>R); Radicular pain of shoulder (Right); Carpal tunnel syndrome (Bilateral); Chronic cervical radicular pain (Right); Abnormal MRI, lumbar spine; Chronic sacroiliac joint  pain (Bilateral) (L>R); Neurogenic pain; Cervical spondylosis; Cervical facet arthropathy; Chronic neck pain (Bilateral) (R>L); Cervical facet syndrome (Bilateral) (R>L); Chronic upper extremity pain (Bilateral) (R>L); Cervical foraminal stenosis;  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 His primarily concern today is the Back Pain (lower) and Leg Pain (bilaterally)  Pain Assessment: Location: Right, Left, Lower Back Radiating: down backs of both legs to thighs; states pain is worse since surgery for kidney stones last month Onset: More than a month ago Duration: Chronic pain Quality: Constant, Spasm Severity: 5 /10 (subjective, self-reported pain score)  Note: Reported level is compatible with observation.                          Timing: Constant Modifying factors: nothing helps BP: 133/85  HR: 86  [name] was last scheduled for an appointment on 10/23/2017 for medication management. During today's appointment we reviewed Mr. Heimsoth's chronic pain status, as well as his outpatient medication regimen. He amdits that the left side is greater than the right.   The patient  reports that he does not use drugs. His body mass index is 30.85 kg/m.  Further details on both, my assessment(s), as well as the proposed treatment plan, please see below.  Controlled Substance Pharmacotherapy Assessment REMS (Risk Evaluation and  Mitigation Strategy)  Analgesic:Oxycodone IR 10 mg 1 tablet by mouth 4 times a day (40 mg/dayof oxycodone) (60 MME/Day) MME/day:63m/day.  Rise Patience, RN  10/31/2017 11:28 AM  Sign at close encounter Nursing Pain Medication Assessment:  Safety precautions to be maintained throughout the outpatient stay will include: orient to surroundings, keep bed in low position, maintain call bell within reach at all times, provide assistance with transfer out of bed and ambulation.  Medication Inspection Compliance: Pill count conducted under aseptic conditions, in front of the patient. Neither the pills nor the bottle was removed from the patient's sight at any time. Once count was completed pills were immediately returned to the patient in their original bottle.  Medication: oxycodone 10 mg Pill/Patch Count: 0 of 120 pills remain Pill/Patch Appearance: Markings consistent with prescribed medication Bottle Appearance: Standard pharmacy container. Clearly labeled. Filled Date: 4 / 12 / 2019 Last Medication intake:  Yesterday   Pharmacokinetics: Liberation and absorption (onset of action): WNL Distribution (time to peak effect): WNL Metabolism and excretion (duration of action): WNL         Pharmacodynamics: Desired effects: Analgesia: Mr. Scioneaux reports >50% benefit. Functional ability: Patient reports that medication allows him to accomplish basic ADLs Clinically meaningful improvement in function (CMIF): Sustained CMIF goals met Perceived effectiveness: Described as relatively effective, allowing for increase in activities of daily living (ADL) Undesirable effects: Side-effects or Adverse reactions: None reported Monitoring: [place] PMP: Online review of the past 23-monthperiod conducted. Compliant with practice rules and regulations Last UDS on record: Summary  Date Value Ref Range Status  04/30/2017 FINAL  Final    Comment:     ==================================================================== TOXASSURE SELECT 13 (MW) ==================================================================== Test                             Result       Flag       Units Drug Present and Declared for Prescription Verification   Oxycodone                      721          EXPECTED   ng/mg creat   Oxymorphone                    264          EXPECTED   ng/mg creat   Noroxycodone                   1079         EXPECTED   ng/mg creat   Noroxymorphone                 131          EXPECTED   ng/mg creat    Sources of oxycodone are scheduled prescription medications.    Oxymorphone, noroxycodone, and noroxymorphone are expected    metabolites of oxycodone. Oxymorphone is also available as a    scheduled prescription medication. ==================================================================== Test                      Result    Flag   Units      Ref Range   Creatinine              109              mg/dL      >=20 ==================================================================== Declared Medications:  The flagging and interpretation on this report are based on the  following declared medications.  Unexpected results may arise  from  inaccuracies in the declared medications.  **Note: The testing scope of this panel includes these medications:  Oxycodone  **Note: The testing scope of this panel does not include following  reported medications:  Atorvastatin (Lipitor)  Celecoxib (Celebrex)  Chlorthalidone  Etodolac  Gabapentin  Lisinopril  Promethazine  Ranitidine (Zantac)  Sitagliptin (Januvia) ==================================================================== For clinical consultation, please call [phone]. ====================================================================    UDS interpretation: Compliant          Medication Assessment Form: Reviewed. Patient indicates being compliant with therapy Treatment  compliance: Compliant Risk Assessment Profile: Aberrant behavior: See prior evaluations. None observed or detected today Comorbid factors increasing risk of overdose: See prior notes. No additional risks detected today Risk of substance use disorder (SUD): Low Opioid Risk Tool - 10/31/17 1126      Family History of Substance Abuse   Alcohol  Negative    Illegal Drugs  Negative    Rx Drugs  Negative      Personal History of Substance Abuse   Alcohol  Negative    Illegal Drugs  Negative    Rx Drugs  Negative      Age   Age between 110-45 years   No      History of Preadolescent Sexual Abuse   History of Preadolescent Sexual Abuse  Negative or Male      Psychological Disease   Psychological Disease  Negative    Depression  Negative      Total Score   Opioid Risk Tool Scoring  0    Opioid Risk Interpretation  Low Risk      ORT Scoring interpretation table:  Score <3 = Low Risk for SUD  Score between 4-7 = Moderate Risk for SUD  Score >8 = High Risk for Opioid Abuse   Risk Mitigation Strategies:  Patient Counseling: Covered Patient-Prescriber Agreement (PPA): Present and active  Notification to other healthcare providers: Done  Pharmacologic Plan: No change in therapy, at this time.             Laboratory Chemistry  Inflammation Markers (CRP: Acute Phase) (ESR: Chronic Phase) Lab Results  Component Value Date   CRP 1.8 11/29/2016   ESRSEDRATE 11 11/29/2016                         Rheumatology Markers No results found for: RF, ANA, LABURIC, URICUR, LYMEIGGIGMAB, LYMEABIGMQN, HLAB27                      Renal Function Markers Lab Results  Component Value Date   BUN 21 (H) 09/02/2017   CREATININE 1.07 09/02/2017   BCR 14 03/07/2017   GFRAA >60 09/02/2017   GFRNONAA >60 09/02/2017                              Hepatic Function Markers Lab Results  Component Value Date   AST 35 09/02/2017   ALT 41 09/02/2017   ALBUMIN 4.1 09/02/2017   ALKPHOS 52 09/02/2017    LIPASE 56 (H) 04/15/2017                        Electrolytes Lab Results  Component Value Date   NA 135 09/02/2017   K 3.1 (L) 09/02/2017   CL 99 (L) 09/02/2017   CALCIUM 9.3 09/02/2017   MG 2.0 11/29/2016  Neuropathy Markers Lab Results  Component Value Date   WUJWJXBJ47 829 11/29/2016   HGBA1C 6.9 (H) 03/07/2017                        Bone Pathology Markers Lab Results  Component Value Date   25OHVITD1 24 (L) 11/29/2016   25OHVITD2 <1.0 11/29/2016   25OHVITD3 24 11/29/2016                         Coagulation Parameters Lab Results  Component Value Date   PLT 303 09/02/2017                        Cardiovascular Markers Lab Results  Component Value Date   TROPONINI <0.03 02/18/2017   HGB 18.1 (H) 09/02/2017   HCT 55.4 (H) 09/02/2017                         CA Markers No results found for: CEA, CA125, LABCA2                      Note: Lab results reviewed.  Recent Diagnostic Imaging Results  CT RENAL STONE STUDY CLINICAL DATA:  Right flank pain for 1 year. Gross hematuria for 3 weeks. Recent right ureteroscopy and stent placement.  EXAM: CT ABDOMEN AND PELVIS WITHOUT CONTRAST  TECHNIQUE: Multidetector CT imaging of the abdomen and pelvis was performed following the standard protocol without IV contrast.  COMPARISON:  09/02/2017  FINDINGS: Lower chest: Right hemidiaphragm elevation with volume loss. Normal heart size without pericardial or pleural effusion. Lad and left circumflex coronary artery atherosclerosis.  Hepatobiliary: Normal liver. Cholecystectomy, without biliary ductal dilatation.  Pancreas: Normal, without mass or ductal dilatation.  Spleen: Normal in size, without focal abnormality.  Adrenals/Urinary Tract: Normal adrenal glands. No renal calculi. Interval resolution of previously described right-sided hydroureteronephrosis. Punctate lower pole right renal collecting system stone is no longer identified  and has presumably passed. No bladder calculi.  Stomach/Bowel: Proximal gastric underdistention. Scattered colonic diverticula. Normal terminal ileum and appendix. Normal small bowel.  Vascular/Lymphatic: Aortic and branch vessel atherosclerosis. No abdominopelvic adenopathy.  Reproductive: Normal prostate.  Other: No significant free fluid. Bilateral tiny fat containing inguinal hernias. Periumbilical fat containing hernia as well. Mild left-sided gynecomastia.  Musculoskeletal: Degenerative partial fusion of the bilateral sacroiliac joints. Probable bone island in the left iliac. Right hemidiaphragm elevation is moderate.  IMPRESSION: 1. Interval passage of right-sided renal calculus and resolution of right-sided hydronephrosis. 2. No residual stones or obstructive uropathy. No explanation for right-sided pain.  Electronically Signed   By: Abigail Miyamoto M.D.   On: 09/23/2017 16:42  Complexity Note: Imaging results reviewed. Results shared with Mr. Cryder, using [employer].                         Meds   Current Outpatient Medications:  .  acetaminophen (TYLENOL) 500 MG tablet, Take 1,000 mg by mouth every 6 (six) hours as needed (for pain.)., Disp: , Rfl:  .  albuterol (PROVENTIL HFA;VENTOLIN HFA) 108 (90 Base) MCG/ACT inhaler, Inhale 2 puffs into the lungs every 6 (six) hours as needed for wheezing or shortness of breath., Disp: 1 Inhaler, Rfl: 2 .  atorvastatin (LIPITOR) 40 MG tablet, Take 1 tablet (40 mg total) by mouth at bedtime., Disp: 30 tablet, Rfl: 5 .  chlorthalidone (HYGROTON)  50 MG tablet, Take 50 mg by mouth daily., Disp: , Rfl:  .  gabapentin (NEURONTIN) 300 MG capsule, Take 300 mg by mouth 3 (three) times daily., Disp: , Rfl:  .  lisinopril (PRINIVIL,ZESTRIL) 20 MG tablet, Take 1 tablet (20 mg total) by mouth daily. Reported on 12/08/2015, Disp: 30 tablet, Rfl: 5 .  potassium chloride (K-DUR) 10 MEQ tablet, Take 1 tablet (10 mEq total) by mouth daily.,  Disp: 7 tablet, Rfl: 0 .  pregabalin (LYRICA) 25 MG capsule, Take 25 mg by mouth 2 (two) times daily., Disp: , Rfl:  .  ranitidine (ZANTAC) 150 MG tablet, Take 150 mg by mouth daily., Disp: , Rfl:  .  sitaGLIPtin (JANUVIA) 50 MG tablet, Take 1 tablet (50 mg total) by mouth daily., Disp: 30 tablet, Rfl: 5 .  tamsulosin (FLOMAX) 0.4 MG CAPS capsule, Take 1 capsule (0.4 mg total) by mouth daily after supper., Disp: 10 capsule, Rfl: 0 .  [START ON 12/30/2017] Oxycodone HCl 10 MG TABS, Take 1 tablet (10 mg total) by mouth every 6 (six) hours as needed., Disp: 120 tablet, Rfl: 0 .  [START ON 11/30/2017] Oxycodone HCl 10 MG TABS, Take 1 tablet (10 mg total) by mouth every 6 (six) hours as needed., Disp: 120 tablet, Rfl: 0 .  Oxycodone HCl 10 MG TABS, Take 1 tablet (10 mg total) by mouth every 6 (six) hours as needed., Disp: 120 tablet, Rfl: 0  ROS  Constitutional: Denies any fever or chills Gastrointestinal: No reported hemesis, hematochezia, vomiting, or acute GI distress Musculoskeletal: Denies any acute onset joint swelling, redness, loss of ROM, or weakness Neurological: No reported episodes of acute onset apraxia, aphasia, dysarthria, agnosia, amnesia, paralysis, loss of coordination, or loss of consciousness  Allergies  Mr. Frimpong has No Known Allergies.  [place]  Drug: Mr. Juenger  reports that he does not use drugs. Alcohol:  reports that he does not drink alcohol. Tobacco:  reports that he has never smoked. He quit smokeless tobacco use about 4 years ago. His smokeless tobacco use included chew. Medical:  has a past medical history of Arthritis, Back ache (05/05/2013), Diabetes mellitus without complication ([place]), GERD (gastroesophageal reflux disease), Headache, Hyperlipemia, Hypertension, Kidney stones, Kidney stones, Nephrolithiasis, and Shortness of breath dyspnea. Surgical: Mr. Eller  has a past surgical history that includes Cholecystectomy (2000); Shoulder surgery (Right, 2016);  Colonoscopy with propofol (N/A, 04/11/2015); Esophagogastroduodenoscopy (egd) with propofol (N/A, 04/11/2015); Shoulder surgery (Left, 06/2015); Back surgery (2015); Back surgery (08/09/2016); and Cystoscopy with ureteroscopy and stent placement (Right, 08/27/2017). Family: family history includes Cancer in his maternal grandmother, mother, sister, and sister; Heart disease in his father; Stroke in his father.  Constitutional Exam  General appearance: Well nourished, well developed, and well hydrated. In no apparent acute distress Vitals:   10/31/17 1113  BP: 133/85  Pulse: 86  Resp: 16  Temp: (!) 97.5 F (36.4 C)  TempSrc: Oral  SpO2: 100%  Weight: 197 lb (89.4 kg)  Height: _0  (1.702 m)  Psych/Mental status: Alert, oriented x 3 (person, place, & time)       Eyes: PERLA Respiratory: No evidence of acute respiratory distress  Lumbar Spine Area Exam  Skin & Axial Inspection: No masses, redness, or swelling Alignment: Symmetrical Functional ROM: Unrestricted ROM       Stability: No instability detected Muscle Tone/Strength: Functionally intact. No obvious neuro-muscular anomalies detected. Sensory (Neurological): Unimpaired Palpation: Tender       Provocative Tests: Lumbar Hyperextension/rotation test: Positive bilaterally for facet  joint pain. Lumbar quadrant test (Kemp's test): deferred today       Lumbar Lateral bending test: deferred today       Patrick's Maneuver: deferred today                   FABER test: deferred today       Thigh-thrust test: deferred today       S-I compression test: deferred today       S-I distraction test: deferred today        Gait & Posture Assessment  Ambulation: Unassisted Gait: Relatively normal for age and body habitus Posture: WNL   Lower Extremity Exam    Side: Right lower extremity  Side: Left lower extremity  Stability: No instability observed          Stability: No instability observed          Skin & Extremity Inspection: Skin  color, temperature, and hair growth are WNL. No peripheral edema or cyanosis. No masses, redness, swelling, asymmetry, or associated skin lesions. No contractures.  Skin & Extremity Inspection: Skin color, temperature, and hair growth are WNL. No peripheral edema or cyanosis. No masses, redness, swelling, asymmetry, or associated skin lesions. No contractures.  Functional ROM: Unrestricted ROM                  Functional ROM: Unrestricted ROM                  Muscle Tone/Strength: Functionally intact. No obvious neuro-muscular anomalies detected.  Muscle Tone/Strength: Functionally intact. No obvious neuro-muscular anomalies detected.  Sensory (Neurological): Unimpaired  Sensory (Neurological): Unimpaired  Palpation: No palpable anomalies  Palpation: No palpable anomalies   Assessment  Primary Diagnosis & Pertinent Problem List: The primary encounter diagnosis was Spondylosis of lumbar spine. Diagnoses of Lumbar facet hypertrophy, Chronic low back pain (Primary Source of Pain) (Bilateral) (L>R), Long term prescription opiate use, and Chronic pain syndrome were also pertinent to this visit.  Status Diagnosis  Worsening Worsening Worsening 1. Spondylosis of lumbar spine   2. Lumbar facet hypertrophy   3. Chronic low back pain (Primary Source of Pain) (Bilateral) (L>R)   4. Long term prescription opiate use   5. Chronic pain syndrome     Problems updated and reviewed during this visit: No problems updated. Plan of Care  Pharmacotherapy (Medications Ordered): Meds ordered this encounter  Medications  . Oxycodone HCl 10 MG TABS    Sig: Take 1 tablet (10 mg total) by mouth every 6 (six) hours as needed.    Dispense:  120 tablet    Refill:  0    Do not place this medication, or any other prescription from our practice, on "Automatic Refill". Patient may have prescription filled one day early if pharmacy is closed on scheduled refill date. Do not fill until: 12/30/2017 To last until:01/29/2018     Order Specific Question:   Supervising Provider    Answer:   Milinda Pointer [phone]  . Oxycodone HCl 10 MG TABS    Sig: Take 1 tablet (10 mg total) by mouth every 6 (six) hours as needed.    Dispense:  120 tablet    Refill:  0    Do not place this medication, or any other prescription from our practice, on "Automatic Refill". Patient may have prescription filled one day early if pharmacy is closed on scheduled refill date. Do not fill until:11/30/2017 To last until:12/30/2017    Order Specific Question:   Supervising  Provider    Answer:   Milinda Pointer [phone]  . Oxycodone HCl 10 MG TABS    Sig: Take 1 tablet (10 mg total) by mouth every 6 (six) hours as needed.    Dispense:  120 tablet    Refill:  0    Do not place this medication, or any other prescription from our practice, on "Automatic Refill". Patient may have prescription filled one day early if pharmacy is closed on scheduled refill date. Do not fill until:10/31/2017 To last until: 11/30/2017    Order Specific Question:   Supervising Provider    Answer:   Milinda Pointer [phone]   New Prescriptions   No medications on file   Medications administered today: Keonte Daubenspeck. Magallanes had no medications administered during this visit. Lab-work, procedure(s), and/or referral(s): Orders Placed This Encounter  Procedures  . Radiofrequency,Lumbar  . ToxASSURE Select 13 (MW), Urine   Imaging and/or referral(s): None  Interventional therapies: Planned, scheduled, and/or pending: Left sacroiliac joint radiofrequencyablation    Considering:  Diagnostic bilateral cervical facet block Possible bilateral cervical facet radiofrequency ablation  Diagnostic right-sided greater occipital nerve block Possible right-sided greater occipital nerve radiofrequencyablation  Possible right-sided peripheral nerve stimulator trial  Diagnostic right-sided cervical epidural steroid injection  Diagnostic bilateral intra-articular  shoulder injection Diagnostic bilateral suprascapular nerve block Possible bilateral suprascapular nerve radiofrequencyablation  Diagnostic bilateral sacroiliac joint block Possible bilateral sacroiliac joint radiofrequencyablation  Diagnostic bilateral lumbar facet block Possible bilateral lumbar facet radiofrequencyablation  Diagnostic left L4-5 transforaminal epiduralsteroid injection  Diagnostic left-sided L4-5 lumbar epidural steroid injection  Diagnostic left intra-articular hip joint injection Possible left hip joint radiofrequencyablation  Diagnostic bilateral median nerve block(carpal tunnel block)  Diagnostic right-sided celiac plexus block   Palliative PRN treatment(s):  None at this time      Provider-requested follow-up: Return in about 3 months (around 01/31/2018) for MedMgmt with Me Donella Stade Edison Pace).  Future Appointments  Date Time Provider [place]  12/26/2017  2:15 PM Milinda Pointer, MD [facility] None  01/28/2018  9:15 AM Vevelyn Francois, NP [facility] None   Primary Care Physician: Center, [place] Location: [facility] Outpatient Pain Management Facility Note by: Vevelyn Francois NP Date: 10/31/2017; Time: 3:44 PM  Pain Score Disclaimer: We use the NRS-11 scale. This is a self-reported, subjective measurement of pain severity with only modest accuracy. It is used primarily to identify changes within a particular patient. It must be understood that outpatient pain scales are significantly less accurate that those used for research, where they can be applied under ideal controlled circumstances with minimal exposure to variables. In reality, the score is likely to be a combination of pain intensity and pain affect, where pain affect describes the degree of emotional arousal or changes in action readiness caused by the sensory experience of pain. Factors such as social and work situation, setting, emotional state, anxiety levels, expectation,  and prior pain experience may influence pain perception and show large inter-individual differences that may also be affected by time variables.  Patient instructions provided during this appointment: Patient Instructions   ____________________________________________________________________________________________  Medication Rules  Applies to: All patients receiving prescriptions (written or electronic).  Pharmacy of record: Pharmacy where electronic prescriptions will be sent. If written prescriptions are taken to a different pharmacy, please inform the nursing staff. The pharmacy listed in the electronic medical record should be the one where you would like electronic prescriptions to be sent.  Prescription refills: Only during scheduled appointments. Applies to both, written and electronic  prescriptions.  NOTE: The following applies primarily to controlled substances (Opioid* Pain Medications).   Patient's responsibilities: 1. Pain Pills: Bring all pain pills to every appointment (except for procedure appointments). 2. Pill Bottles: Bring pills in original pharmacy bottle. Always bring newest bottle. Bring bottle, even if empty. 3. Medication refills: You are responsible for knowing and keeping track of what medications you need refilled. The day before your appointment, write a list of all prescriptions that need to be refilled. Bring that list to your appointment and give it to the admitting nurse. Prescriptions will be written only during appointments. If you forget a medication, it will not be "Called in", "Faxed", or "electronically sent". You will need to get another appointment to get these prescribed. 4. Prescription Accuracy: You are responsible for carefully inspecting your prescriptions before leaving our office. Have the discharge nurse carefully go over each prescription with you, before taking them home. Make sure that your name is accurately spelled, that your address is  correct. Check the name and dose of your medication to make sure it is accurate. Check the number of pills, and the written instructions to make sure they are clear and accurate. Make sure that you are given enough medication to last until your next medication refill appointment. 5. Taking Medication: Take medication as prescribed. Never take more pills than instructed. Never take medication more frequently than prescribed. Taking less pills or less frequently is permitted and encouraged, when it comes to controlled substances (written prescriptions).  6. Inform other Doctors: Always inform, all of your healthcare providers, of all the medications you take. 7. Pain Medication from other Providers: You are not allowed to accept any additional pain medication from any other Doctor or Healthcare provider. There are two exceptions to this rule. (see below) In the event that you require additional pain medication, you are responsible for notifying us, as stated below. 8. Medication Agreement: You are responsible for carefully reading and following our Medication Agreement. This must be signed before receiving any prescriptions from our practice. Safely store a copy of your signed Agreement. Violations to the Agreement will result in no further prescriptions. (Additional copies of our Medication Agreement are available upon request.) 9. Laws, Rules, & Regulations: All patients are expected to follow all Federal and [employer], [employer], Rules, [employer] [facility]. Ignorance of the Laws does not constitute a valid excuse. The use of any illegal substances is prohibited. 10. Adopted CDC guidelines & recommendations: Target dosing levels will be at or below 60 MME/day. Use of benzodiazepines** is not recommended.  Exceptions: There are only two exceptions to the rule of not receiving pain medications from other Healthcare Providers. 1. Exception #1 (Emergencies): In the event of an emergency (i.e.: accident requiring  emergency care), you are allowed to receive additional pain medication. However, you are responsible for: As soon as you are able, call our office [phone], at any time of the day or night, and leave a message stating your name, the date and nature of the emergency, and the name and dose of the medication prescribed. In the event that your call is answered by a member of our staff, make sure to document and save the date, time, and the name of the person that took your information.  2. Exception #2 (Planned Surgery): In the event that you are scheduled by another doctor or dentist to have any type of surgery or procedure, you are allowed (for a period no longer than 30 days), to receive  additional pain medication, for the acute post-op pain. However, in this case, you are responsible for picking up a copy of our "Post-op Pain Management for Surgeons" handout, and giving it to your surgeon or dentist. This document is available at our office, and does not require an appointment to obtain it. Simply go to our office during business hours (Monday-Thursday from 8:00 AM to 4:00 PM) (Friday 8:00 AM to 12:00 Noon) or if you have a scheduled appointment with [place], prior to your surgery, and ask for it by name. In addition, you will need to provide [place] with your name, name of your surgeon, type of surgery, and date of procedure or surgery.  *Opioid medications include: morphine, codeine, oxycodone, oxymorphone, hydrocodone, hydromorphone, meperidine, tramadol, tapentadol, buprenorphine, fentanyl, methadone. **Benzodiazepine medications include: diazepam (Valium), alprazolam (Xanax), clonazepam (Klonopine), lorazepam (Ativan), clorazepate (Tranxene), chlordiazepoxide (Librium), estazolam (Prosom), oxazepam (Serax), temazepam (Restoril), triazolam (Halcion) (Last updated: 08/08/2017) ____________________________________________________________________________________________   BMI Assessment: Estimated body mass  index is 30.85 kg/m as calculated from the following:   Height as of this encounter: _0  (1.702 m).   Weight as of this encounter: 197 lb (89.4 kg).  BMI interpretation table: BMI level Category Range association with higher incidence of chronic pain  <18 kg/m2 Underweight   18.5-24.9 kg/m2 Ideal body weight   25-29.9 kg/m2 Overweight Increased incidence by 20%  30-34.9 kg/m2 Obese (Class I) Increased incidence by 68%  35-39.9 kg/m2 Severe obesity (Class II) Increased incidence by 136%  >40 kg/m2 Extreme obesity (Class III) Increased incidence by 254%   Patient's current BMI Ideal Body weight  Body mass index is 30.85 kg/m. Ideal body weight: 66.1 kg (145 lb 11.6 oz) Adjusted ideal body weight: 75.4 kg (166 lb 3.8 oz)   BMI Readings from Last 4 Encounters:  10/31/17 30.85 kg/m  09/17/17 30.40 kg/m  09/01/17 30.54 kg/m  08/27/17 30.54 kg/m   Wt Readings from Last 4 Encounters:  10/31/17 197 lb (89.4 kg)  09/17/17 194 lb 1.6 oz (88 kg)  09/01/17 195 lb (88.5 kg)  08/27/17 195 lb (88.5 kg)   ____________________________________________________________________________________________  General Risks and Possible Complications  Patient Responsibilities: It is important that you read this as it is part of your informed consent. It is our duty to inform you of the risks and possible complications associated with treatments offered to you. It is your responsibility as a patient to read this and to ask questions about anything that is not clear or that you believe was not covered in this document.  Patient's Rights: You have the right to refuse treatment. You also have the right to change your mind, even after initially having agreed to have the treatment done. However, under this last option, if you wait until the last second to change your mind, you may be charged for the materials used up to that point.  Introduction: Medicine is not an Chief Strategy Officer. Everything in Medicine,  including the lack of treatment(s), carries the potential for danger, harm, or loss (which is by definition: Risk). In Medicine, a complication is a secondary problem, condition, or disease that can aggravate an already existing one. All treatments carry the risk of possible complications. The fact that a side effects or complications occurs, does not imply that the treatment was conducted incorrectly. It must be clearly understood that these can happen even when everything is done following the highest safety standards.  No treatment: You can choose not to proceed with the proposed treatment alternative. The "PRO(s)" would  include: avoiding the risk of complications associated with the therapy. The "CON(s)" would include: not getting any of the treatment benefits. These benefits fall under one of three categories: diagnostic; therapeutic; and/or palliative. Diagnostic benefits include: getting information which can ultimately lead to improvement of the disease or symptom(s). Therapeutic benefits are those associated with the successful treatment of the disease. Finally, palliative benefits are those related to the decrease of the primary symptoms, without necessarily curing the condition (example: decreasing the pain from a flare-up of a chronic condition, such as incurable terminal cancer).  General Risks and Complications: These are associated to most interventional treatments. They can occur alone, or in combination. They fall under one of the following six (6) categories: no benefit or worsening of symptoms; bleeding; infection; nerve damage; allergic reactions; and/or death. 1. No benefits or worsening of symptoms: In Medicine there are no guarantees, only probabilities. No healthcare provider can ever guarantee that a medical treatment will work, they can only state the probability that it may. Furthermore, there is always the possibility that the condition may worsen, either directly, or indirectly, as  a consequence of the treatment. 2. Bleeding: This is more common if the patient is taking a blood thinner, either prescription or over the counter (example: Goody Powders, Fish oil, Aspirin, Garlic, etc.), or if suffering a condition associated with impaired coagulation (example: Hemophilia, cirrhosis of the liver, low platelet counts, etc.). However, even if you do not have one on these, it can still happen. If you have any of these conditions, or take one of these drugs, make sure to notify your treating physician. 3. Infection: This is more common in patients with a compromised immune system, either due to disease (example: diabetes, cancer, human immunodeficiency virus [HIV], etc.), or due to medications or treatments (example: therapies used to treat cancer and rheumatological diseases). However, even if you do not have one on these, it can still happen. If you have any of these conditions, or take one of these drugs, make sure to notify your treating physician. 4. Nerve Damage: This is more common when the treatment is an invasive one, but it can also happen with the use of medications, such as those used in the treatment of cancer. The damage can occur to small secondary nerves, or to large primary ones, such as those in the spinal cord and brain. This damage may be temporary or permanent and it may lead to impairments that can range from temporary numbness to permanent paralysis and/or brain death. 5. Allergic Reactions: Any time a substance or material comes in contact with our body, there is the possibility of an allergic reaction. These can range from a mild skin rash (contact dermatitis) to a severe systemic reaction (anaphylactic reaction), which can result in death. 6. Death: In general, any medical intervention can result in death, most of the time due to an unforeseen complication. ____________________________________________________________________________________________  Radiofrequency  Lesioning Radiofrequency lesioning is a procedure that is performed to relieve pain. The procedure is often used for back, neck, or arm pain. Radiofrequency lesioning involves the use of a machine that creates radio waves to make heat. During the procedure, the heat is applied to the nerve that carries the pain signal. The heat damages the nerve and interferes with the pain signal. Pain relief usually starts about 2 weeks after the procedure and lasts for 6 months to 1 year. Tell a health care provider about:  Any allergies you have.  All medicines you are taking, including  vitamins, herbs, eye drops, creams, and over-the-counter medicines.  Any problems you or family members have had with anesthetic medicines.  Any blood disorders you have.  Any surgeries you have had.  Any medical conditions you have.  Whether you are pregnant or may be pregnant. What are the risks? Generally, this is a safe procedure. However, problems may occur, including:  Pain or soreness at the injection site.  Infection at the injection site.  Damage to nerves or blood vessels.  What happens before the procedure?  Ask your health care provider about: ? Changing or stopping your regular medicines. This is especially important if you are taking diabetes medicines or blood thinners. ? Taking medicines such as aspirin and ibuprofen. These medicines can thin your blood. Do not take these medicines before your procedure if your health care provider instructs you not to.  Follow instructions from your health care provider about eating or drinking restrictions.  Plan to have someone take you home after the procedure.  If you go home right after the procedure, plan to have someone with you for 24 hours. What happens during the procedure?  You will be given one or more of the following: ? A medicine to help you relax (sedative). ? A medicine to numb the area (local anesthetic).  You will be awake during the  procedure. You will need to be able to talk with the health care provider during the procedure.  With the help of a type of X-ray (fluoroscopy), the health care provider will insert a radiofrequency needle into the area to be treated.  Next, a wire that carries the radio waves (electrode) will be put through the radiofrequency needle. An electrical pulse will be sent through the electrode to verify the correct nerve. You will feel a tingling sensation, and you may have muscle twitching.  Then, the tissue that is around the needle tip will be heated by an electric current that is passed using the radiofrequency machine. This will numb the nerves.  A bandage (dressing) will be put on the insertion area after the procedure is done. The procedure may vary among health care providers and hospitals. What happens after the procedure?  Your blood pressure, heart rate, breathing rate, and blood oxygen level will be monitored often until the medicines you were given have worn off.  Return to your normal activities as directed by your health care provider. This information is not intended to replace advice given to you by your health care provider. Make sure you discuss any questions you have with your health care provider. Document Released: 01/24/2011 Document Revised: 11/03/2015 Document Reviewed: 07/05/2014 Elsevier Interactive Patient Education  [employer].

## 2017-11-09 LAB — TOXASSURE SELECT 13 (MW), URINE

## 2017-12-05 ENCOUNTER — Telehealth: Payer: Self-pay | Admitting: Urology

## 2017-12-05 NOTE — Telephone Encounter (Signed)
Patient is calling and asking if he can have a refill on flomax?  Please call patient back to discuss   Sharyn Lull

## 2017-12-06 MED ORDER — TAMSULOSIN HCL 0.4 MG PO CAPS: 0.4000 mg | ORAL_CAPSULE | Freq: Every day | ORAL | 3 refills | Status: DC

## 2017-12-06 NOTE — Telephone Encounter (Signed)
[place] approved, 3 month supply sent to pharmacy, pt informed he needs follow-up appointment to discuss.

## 2017-12-06 NOTE — Addendum Note (Signed)
Addended by: Garnette Gunner on: 12/06/2017 04:59 PM   Modules accepted: Orders

## 2017-12-18 ENCOUNTER — Emergency Department: Payer: Medicare Other

## 2017-12-18 ENCOUNTER — Encounter: Payer: Self-pay | Admitting: Emergency Medicine

## 2017-12-18 ENCOUNTER — Observation Stay: Payer: Medicare Other | Admitting: Anesthesiology

## 2017-12-18 ENCOUNTER — Encounter
Admission: EM | Disposition: A | Discharge status: Home / Self Care | Payer: Self-pay | Source: Home / Self Care | Attending: Student in an Organized Health Care Education/Training Program

## 2017-12-18 ENCOUNTER — Observation Stay
Admission: EM | Admit: 2017-12-18 | Discharge: 2017-12-19 | Disposition: A | Discharge status: Home / Self Care | Payer: Medicare Other | Attending: Surgery | Admitting: Surgery

## 2017-12-18 ENCOUNTER — Other Ambulatory Visit: Payer: Self-pay

## 2017-12-18 DIAGNOSIS — G8929 Other chronic pain: Secondary | ICD-10-CM | POA: Diagnosis not present

## 2017-12-18 DIAGNOSIS — Z803 Family history of malignant neoplasm of breast: Secondary | ICD-10-CM | POA: Insufficient documentation

## 2017-12-18 DIAGNOSIS — Z8249 Family history of ischemic heart disease and other diseases of the circulatory system: Secondary | ICD-10-CM | POA: Insufficient documentation

## 2017-12-18 DIAGNOSIS — M1990 Unspecified osteoarthritis, unspecified site: Secondary | ICD-10-CM | POA: Diagnosis not present

## 2017-12-18 DIAGNOSIS — Z87891 Personal history of nicotine dependence: Secondary | ICD-10-CM | POA: Insufficient documentation

## 2017-12-18 DIAGNOSIS — E7800 Pure hypercholesterolemia, unspecified: Secondary | ICD-10-CM | POA: Diagnosis not present

## 2017-12-18 DIAGNOSIS — M549 Dorsalgia, unspecified: Secondary | ICD-10-CM | POA: Diagnosis not present

## 2017-12-18 DIAGNOSIS — R51 Headache: Secondary | ICD-10-CM | POA: Insufficient documentation

## 2017-12-18 DIAGNOSIS — Z823 Family history of stroke: Secondary | ICD-10-CM | POA: Insufficient documentation

## 2017-12-18 DIAGNOSIS — K3580 Unspecified acute appendicitis: Principal | ICD-10-CM | POA: Diagnosis present

## 2017-12-18 DIAGNOSIS — K219 Gastro-esophageal reflux disease without esophagitis: Secondary | ICD-10-CM | POA: Insufficient documentation

## 2017-12-18 DIAGNOSIS — Z87442 Personal history of urinary calculi: Secondary | ICD-10-CM | POA: Insufficient documentation

## 2017-12-18 DIAGNOSIS — Z9049 Acquired absence of other specified parts of digestive tract: Secondary | ICD-10-CM | POA: Insufficient documentation

## 2017-12-18 DIAGNOSIS — R109 Unspecified abdominal pain: Secondary | ICD-10-CM | POA: Diagnosis not present

## 2017-12-18 DIAGNOSIS — K429 Umbilical hernia without obstruction or gangrene: Secondary | ICD-10-CM | POA: Diagnosis not present

## 2017-12-18 DIAGNOSIS — Z809 Family history of malignant neoplasm, unspecified: Secondary | ICD-10-CM | POA: Insufficient documentation

## 2017-12-18 DIAGNOSIS — K760 Fatty (change of) liver, not elsewhere classified: Secondary | ICD-10-CM | POA: Diagnosis not present

## 2017-12-18 DIAGNOSIS — K37 Unspecified appendicitis: Secondary | ICD-10-CM | POA: Diagnosis not present

## 2017-12-18 DIAGNOSIS — E119 Type 2 diabetes mellitus without complications: Secondary | ICD-10-CM | POA: Insufficient documentation

## 2017-12-18 DIAGNOSIS — E785 Hyperlipidemia, unspecified: Secondary | ICD-10-CM | POA: Insufficient documentation

## 2017-12-18 DIAGNOSIS — K859 Acute pancreatitis without necrosis or infection, unspecified: Secondary | ICD-10-CM | POA: Diagnosis not present

## 2017-12-18 DIAGNOSIS — I10 Essential (primary) hypertension: Secondary | ICD-10-CM | POA: Insufficient documentation

## 2017-12-18 DIAGNOSIS — I700 Atherosclerosis of aorta: Secondary | ICD-10-CM | POA: Diagnosis not present

## 2017-12-18 HISTORY — PX: LAPAROSCOPIC APPENDECTOMY: SHX408

## 2017-12-18 LAB — CBC WITH DIFFERENTIAL/PLATELET
Basophils Absolute: 0.1 10*3/uL (ref 0–0.1)
Basophils Relative: 1 %
Eosinophils Absolute: 0.1 10*3/uL (ref 0–0.7)
Eosinophils Relative: 1 %
HCT: 50.8 % (ref 40.0–52.0)
HEMOGLOBIN: 17.4 g/dL (ref 13.0–18.0)
LYMPHS ABS: 2.9 10*3/uL (ref 1.0–3.6)
LYMPHS PCT: 16 %
MCH: 29.8 pg (ref 26.0–34.0)
MCHC: 34.2 g/dL (ref 32.0–36.0)
MCV: 87.2 fL (ref 80.0–100.0)
Monocytes Absolute: 1.4 10*3/uL — ABNORMAL HIGH (ref 0.2–1.0)
Monocytes Relative: 7 %
NEUTROS PCT: 75 %
Neutro Abs: 14.1 10*3/uL — ABNORMAL HIGH (ref 1.4–6.5)
Platelets: 256 10*3/uL (ref 150–440)
RBC: 5.83 MIL/uL (ref 4.40–5.90)
RDW: 14.9 % — ABNORMAL HIGH (ref 11.5–14.5)
WBC: 18.6 10*3/uL — AB (ref 3.8–10.6)

## 2017-12-18 LAB — URINALYSIS, COMPLETE (UACMP) WITH MICROSCOPIC
BACTERIA UA: NONE SEEN
BILIRUBIN URINE: NEGATIVE
Glucose, UA: NEGATIVE mg/dL
HGB URINE DIPSTICK: NEGATIVE
KETONES UR: NEGATIVE mg/dL
NITRITE: NEGATIVE
PROTEIN: 30 mg/dL — AB
Specific Gravity, Urine: 1.04 — ABNORMAL HIGH (ref 1.005–1.030)
pH: 5 (ref 5.0–8.0)

## 2017-12-18 LAB — COMPREHENSIVE METABOLIC PANEL
ALK PHOS: 45 U/L (ref 38–126)
ALT: 36 U/L (ref 0–44)
AST: 36 U/L (ref 15–41)
Albumin: 4.4 g/dL (ref 3.5–5.0)
Anion gap: 10 (ref 5–15)
BUN: 19 mg/dL (ref 8–23)
CALCIUM: 9 mg/dL (ref 8.9–10.3)
CO2: 27 mmol/L (ref 22–32)
Chloride: 100 mmol/L (ref 98–111)
Creatinine, Ser: 1.12 mg/dL (ref 0.61–1.24)
Glucose, Bld: 166 mg/dL — ABNORMAL HIGH (ref 70–99)
Potassium: 3.1 mmol/L — ABNORMAL LOW (ref 3.5–5.1)
SODIUM: 137 mmol/L (ref 135–145)
Total Bilirubin: 1 mg/dL (ref 0.3–1.2)
Total Protein: 7.5 g/dL (ref 6.5–8.1)

## 2017-12-18 LAB — CBC
HCT: 47.3 % (ref 40.0–52.0)
HEMOGLOBIN: 16.3 g/dL (ref 13.0–18.0)
MCH: 30.1 pg (ref 26.0–34.0)
MCHC: 34.4 g/dL (ref 32.0–36.0)
MCV: 87.4 fL (ref 80.0–100.0)
PLATELETS: 219 10*3/uL (ref 150–440)
RBC: 5.41 MIL/uL (ref 4.40–5.90)
RDW: 15 % — ABNORMAL HIGH (ref 11.5–14.5)
WBC: 17.5 10*3/uL — AB (ref 3.8–10.6)

## 2017-12-18 LAB — GLUCOSE, CAPILLARY
GLUCOSE-CAPILLARY: 173 mg/dL — AB (ref 70–99)
Glucose-Capillary: 181 mg/dL — ABNORMAL HIGH (ref 70–99)

## 2017-12-18 LAB — MRSA PCR SCREENING: MRSA by PCR: NEGATIVE

## 2017-12-18 LAB — CREATININE, SERUM
Creatinine, Ser: 0.96 mg/dL (ref 0.61–1.24)
GFR calc Af Amer: >60 mL/min (ref >60)

## 2017-12-18 SURGERY — APPENDECTOMY, LAPAROSCOPIC
Anesthesia: General | Wound class: Clean Contaminated

## 2017-12-18 MED ORDER — ROCURONIUM BROMIDE 50 MG/5ML IV SOLN
INTRAVENOUS | Status: AC
  Filled 2017-12-18: qty 1

## 2017-12-18 MED ORDER — PIPERACILLIN-TAZOBACTAM 3.375 G IVPB
3.3750 g | Freq: Three times a day (TID) | INTRAVENOUS | Status: DC
Start: 2017-12-18 — End: 2017-12-19
  Administered 2017-12-18 – 2017-12-19 (×4): 3.375 g via INTRAVENOUS
  Filled 2017-12-18 (×3): qty 50

## 2017-12-18 MED ORDER — LINAGLIPTIN 5 MG PO TABS
5.0000 mg | ORAL_TABLET | Freq: Every day | ORAL | Status: DC
  Administered 2017-12-18 – 2017-12-19 (×2): 5 mg via ORAL
  Filled 2017-12-18 (×2): qty 1

## 2017-12-18 MED ORDER — DEXAMETHASONE SODIUM PHOSPHATE 10 MG/ML IJ SOLN
INTRAMUSCULAR | Status: DC | PRN
  Administered 2017-12-18: 4 mg via INTRAVENOUS

## 2017-12-18 MED ORDER — ACETAMINOPHEN 10 MG/ML IV SOLN
INTRAVENOUS | Status: AC
  Filled 2017-12-18: qty 100

## 2017-12-18 MED ORDER — KETOROLAC TROMETHAMINE 30 MG/ML IJ SOLN
INTRAMUSCULAR | Status: AC
  Filled 2017-12-18: qty 1

## 2017-12-18 MED ORDER — OXYCODONE-ACETAMINOPHEN 5-325 MG PO TABS
1.0000 | ORAL_TABLET | ORAL | Status: DC | PRN
Start: 2017-12-18 — End: 2017-12-19
  Administered 2017-12-18 – 2017-12-19 (×5): 1 via ORAL
  Filled 2017-12-18 (×5): qty 1

## 2017-12-18 MED ORDER — FENTANYL CITRATE (PF) 100 MCG/2ML IJ SOLN
100.0000 ug | INTRAMUSCULAR | Status: DC | PRN
  Administered 2017-12-18: 100 ug via INTRAVENOUS
  Filled 2017-12-18: qty 2

## 2017-12-18 MED ORDER — ONDANSETRON HCL 4 MG/2ML IJ SOLN
INTRAMUSCULAR | Status: AC
  Administered 2017-12-18: 4 mg via INTRAVENOUS
  Filled 2017-12-18: qty 2

## 2017-12-18 MED ORDER — PREGABALIN 25 MG PO CAPS
25.0000 mg | ORAL_CAPSULE | Freq: Two times a day (BID) | ORAL | Status: DC
  Administered 2017-12-18 – 2017-12-19 (×3): 25 mg via ORAL
  Filled 2017-12-18 (×3): qty 1

## 2017-12-18 MED ORDER — SUGAMMADEX SODIUM 200 MG/2ML IV SOLN
INTRAVENOUS | Status: AC
  Filled 2017-12-18: qty 2

## 2017-12-18 MED ORDER — ONDANSETRON HCL 4 MG/2ML IJ SOLN
4.0000 mg | Freq: Four times a day (QID) | INTRAMUSCULAR | Status: DC | PRN
  Administered 2017-12-18 – 2017-12-19 (×2): 4 mg via INTRAVENOUS
  Filled 2017-12-18: qty 2

## 2017-12-18 MED ORDER — MIDAZOLAM HCL 2 MG/2ML IJ SOLN
INTRAMUSCULAR | Status: AC
  Filled 2017-12-18: qty 2

## 2017-12-18 MED ORDER — HYDROCODONE-ACETAMINOPHEN 7.5-325 MG PO TABS
1.0000 | ORAL_TABLET | Freq: Once | ORAL | Status: DC | PRN
  Filled 2017-12-18: qty 1

## 2017-12-18 MED ORDER — PROMETHAZINE HCL 25 MG/ML IJ SOLN
12.5000 mg | Freq: Four times a day (QID) | INTRAMUSCULAR | Status: DC | PRN
Start: 2017-12-18 — End: 2017-12-19
  Administered 2017-12-18: 12.5 mg via INTRAVENOUS
  Filled 2017-12-18: qty 1

## 2017-12-18 MED ORDER — ALBUTEROL SULFATE (2.5 MG/3ML) 0.083% IN NEBU
2.5000 mg | INHALATION_SOLUTION | Freq: Four times a day (QID) | RESPIRATORY_TRACT | Status: DC | PRN
Start: 2017-12-18 — End: 2017-12-19

## 2017-12-18 MED ORDER — SUCCINYLCHOLINE CHLORIDE 20 MG/ML IJ SOLN
INTRAMUSCULAR | Status: DC | PRN
  Administered 2017-12-18: 100 mg via INTRAVENOUS

## 2017-12-18 MED ORDER — LIDOCAINE HCL (PF) 2 % IJ SOLN
INTRAMUSCULAR | Status: AC
  Filled 2017-12-18: qty 10

## 2017-12-18 MED ORDER — ACETAMINOPHEN 10 MG/ML IV SOLN
INTRAVENOUS | Status: DC | PRN
  Administered 2017-12-18: 1000 mg via INTRAVENOUS

## 2017-12-18 MED ORDER — IOHEXOL 300 MG/ML  SOLN
100.0000 mL | Freq: Once | INTRAMUSCULAR | Status: AC | PRN
  Administered 2017-12-18: 100 mL via INTRAVENOUS

## 2017-12-18 MED ORDER — ACETAMINOPHEN 160 MG/5ML PO SOLN
325.0000 mg | ORAL | Status: DC | PRN
  Filled 2017-12-18: qty 20.3

## 2017-12-18 MED ORDER — PHENYLEPHRINE HCL 10 MG/ML IJ SOLN
INTRAMUSCULAR | Status: DC | PRN
  Administered 2017-12-18: 100 ug via INTRAVENOUS

## 2017-12-18 MED ORDER — KETOROLAC TROMETHAMINE 30 MG/ML IJ SOLN
INTRAMUSCULAR | Status: DC | PRN
  Administered 2017-12-18: 30 mg via INTRAVENOUS

## 2017-12-18 MED ORDER — ONDANSETRON HCL 4 MG PO TABS: 4.0000 mg | ORAL_TABLET | Freq: Four times a day (QID) | ORAL | Status: DC | PRN

## 2017-12-18 MED ORDER — LISINOPRIL 20 MG PO TABS
20.0000 mg | ORAL_TABLET | Freq: Every day | ORAL | Status: DC
  Administered 2017-12-18 – 2017-12-19 (×2): 20 mg via ORAL
  Filled 2017-12-18 (×2): qty 1

## 2017-12-18 MED ORDER — PROPOFOL 10 MG/ML IV BOLUS
INTRAVENOUS | Status: DC | PRN
  Administered 2017-12-18: 160 mg via INTRAVENOUS

## 2017-12-18 MED ORDER — SODIUM CHLORIDE 0.9 % IV BOLUS
1000.0000 mL | Freq: Once | INTRAVENOUS | Status: AC
  Administered 2017-12-18: 1000 mL via INTRAVENOUS

## 2017-12-18 MED ORDER — BUPIVACAINE-EPINEPHRINE (PF) 0.25% -1:200000 IJ SOLN
INTRAMUSCULAR | Status: DC | PRN
  Administered 2017-12-18: 30 mL via PERINEURAL

## 2017-12-18 MED ORDER — FENTANYL CITRATE (PF) 100 MCG/2ML IJ SOLN
INTRAMUSCULAR | Status: AC
  Filled 2017-12-18: qty 2

## 2017-12-18 MED ORDER — ROCURONIUM BROMIDE 100 MG/10ML IV SOLN
INTRAVENOUS | Status: DC | PRN
  Administered 2017-12-18: 5 mg via INTRAVENOUS
  Administered 2017-12-18: 30 mg via INTRAVENOUS

## 2017-12-18 MED ORDER — MORPHINE SULFATE (PF) 4 MG/ML IV SOLN
4.0000 mg | INTRAVENOUS | Status: DC | PRN
  Administered 2017-12-18 – 2017-12-19 (×7): 4 mg via INTRAVENOUS
  Filled 2017-12-18 (×7): qty 1

## 2017-12-18 MED ORDER — ACETAMINOPHEN 325 MG PO TABS: 325.0000 mg | ORAL_TABLET | ORAL | Status: DC | PRN

## 2017-12-18 MED ORDER — OXYCODONE-ACETAMINOPHEN 5-325 MG PO TABS
ORAL_TABLET | ORAL | Status: DC
Start: 2017-12-18 — End: 2017-12-18
  Filled 2017-12-18: qty 1

## 2017-12-18 MED ORDER — ONDANSETRON HCL 4 MG/2ML IJ SOLN
INTRAMUSCULAR | Status: DC | PRN
  Administered 2017-12-18: 4 mg via INTRAVENOUS

## 2017-12-18 MED ORDER — FENTANYL CITRATE (PF) 100 MCG/2ML IJ SOLN
INTRAMUSCULAR | Status: DC | PRN
  Administered 2017-12-18: 100 ug via INTRAVENOUS
  Administered 2017-12-18: 50 ug via INTRAVENOUS

## 2017-12-18 MED ORDER — CHLORTHALIDONE 25 MG PO TABS
50.0000 mg | ORAL_TABLET | Freq: Every day | ORAL | Status: DC
  Administered 2017-12-18 – 2017-12-19 (×2): 50 mg via ORAL
  Filled 2017-12-18 (×2): qty 2

## 2017-12-18 MED ORDER — PIPERACILLIN-TAZOBACTAM 3.375 G IVPB
INTRAVENOUS | Status: AC
  Filled 2017-12-18: qty 50

## 2017-12-18 MED ORDER — HEPARIN SODIUM (PORCINE) 5000 UNIT/ML IJ SOLN
5000.0000 [IU] | Freq: Three times a day (TID) | INTRAMUSCULAR | Status: DC
  Administered 2017-12-18 – 2017-12-19 (×4): 5000 [IU] via SUBCUTANEOUS
  Filled 2017-12-18 (×4): qty 1

## 2017-12-18 MED ORDER — ONDANSETRON HCL 4 MG/2ML IJ SOLN
INTRAMUSCULAR | Status: AC
  Filled 2017-12-18: qty 2

## 2017-12-18 MED ORDER — GABAPENTIN 300 MG PO CAPS
300.0000 mg | ORAL_CAPSULE | Freq: Three times a day (TID) | ORAL | Status: DC
  Administered 2017-12-18 – 2017-12-19 (×3): 300 mg via ORAL
  Filled 2017-12-18 (×3): qty 1

## 2017-12-18 MED ORDER — DEXAMETHASONE SODIUM PHOSPHATE 10 MG/ML IJ SOLN
INTRAMUSCULAR | Status: AC
  Filled 2017-12-18: qty 1

## 2017-12-18 MED ORDER — ATORVASTATIN CALCIUM 20 MG PO TABS
40.0000 mg | ORAL_TABLET | Freq: Every day | ORAL | Status: DC
  Administered 2017-12-18: 40 mg via ORAL
  Filled 2017-12-18: qty 2

## 2017-12-18 MED ORDER — LIDOCAINE HCL (CARDIAC) PF 100 MG/5ML IV SOSY
PREFILLED_SYRINGE | INTRAVENOUS | Status: DC | PRN
  Administered 2017-12-18: 100 mg via INTRAVENOUS

## 2017-12-18 MED ORDER — PIPERACILLIN-TAZOBACTAM 3.375 G IVPB 30 MIN
3.3750 g | Freq: Once | INTRAVENOUS | Status: AC
  Administered 2017-12-18: 3.375 g via INTRAVENOUS
  Filled 2017-12-18: qty 50

## 2017-12-18 MED ORDER — MEPERIDINE HCL 50 MG/ML IJ SOLN: 6.2500 mg | INTRAMUSCULAR | Status: DC | PRN

## 2017-12-18 MED ORDER — FAMOTIDINE 20 MG PO TABS
10.0000 mg | ORAL_TABLET | Freq: Every day | ORAL | Status: DC
  Administered 2017-12-18 – 2017-12-19 (×2): 10 mg via ORAL
  Filled 2017-12-18 (×2): qty 1

## 2017-12-18 MED ORDER — LACTATED RINGERS IV SOLN
INTRAVENOUS | Status: DC
  Administered 2017-12-18: 04:00:00 via INTRAVENOUS

## 2017-12-18 MED ORDER — OXYCODONE-ACETAMINOPHEN 5-325 MG PO TABS
1.0000 | ORAL_TABLET | Freq: Once | ORAL | Status: AC
  Administered 2017-12-18: 1 via ORAL

## 2017-12-18 MED ORDER — FENTANYL CITRATE (PF) 100 MCG/2ML IJ SOLN: 25.0000 ug | INTRAMUSCULAR | Status: DC | PRN

## 2017-12-18 MED ORDER — SUGAMMADEX SODIUM 200 MG/2ML IV SOLN
INTRAVENOUS | Status: DC | PRN
  Administered 2017-12-18: 175 mg via INTRAVENOUS

## 2017-12-18 MED ORDER — TAMSULOSIN HCL 0.4 MG PO CAPS
0.4000 mg | ORAL_CAPSULE | Freq: Every day | ORAL | Status: DC
  Administered 2017-12-18: 0.4 mg via ORAL
  Filled 2017-12-18: qty 1

## 2017-12-18 MED ORDER — BUPIVACAINE-EPINEPHRINE (PF) 0.25% -1:200000 IJ SOLN
INTRAMUSCULAR | Status: AC
  Filled 2017-12-18: qty 30

## 2017-12-18 MED ORDER — ONDANSETRON 4 MG PO TBDP
4.0000 mg | ORAL_TABLET | Freq: Once | ORAL | Status: AC
  Administered 2017-12-18: 4 mg via ORAL

## 2017-12-18 MED ORDER — PROPOFOL 10 MG/ML IV BOLUS
INTRAVENOUS | Status: AC
  Filled 2017-12-18: qty 20

## 2017-12-18 MED ORDER — MIDAZOLAM HCL 2 MG/2ML IJ SOLN
INTRAMUSCULAR | Status: DC | PRN
  Administered 2017-12-18: 2 mg via INTRAVENOUS

## 2017-12-18 MED ORDER — ONDANSETRON 4 MG PO TBDP
ORAL_TABLET | ORAL | Status: AC
  Filled 2017-12-18: qty 1

## 2017-12-18 MED ORDER — PROMETHAZINE HCL 25 MG/ML IJ SOLN: 6.2500 mg | INTRAMUSCULAR | Status: DC | PRN

## 2017-12-18 SURGICAL SUPPLY — 39 items
ADHESIVE MASTISOL STRL (MISCELLANEOUS) ×2 IMPLANT
APPLIER CLIP ROT 10 11.4 M/L (STAPLE) ×2
BLADE SURG SZ11 CARB STEEL (BLADE) ×2 IMPLANT
CANISTER SUCT 3000ML PPV (MISCELLANEOUS) ×2 IMPLANT
CHLORAPREP W/TINT 26ML (MISCELLANEOUS) ×2 IMPLANT
CLIP APPLIE ROT 10 11.4 M/L (STAPLE) ×1 IMPLANT
CUTTER FLEX LINEAR 45M (STAPLE) ×2 IMPLANT
DEVICE TROCAR PUNCTURE CLOSURE (ENDOMECHANICALS) ×2 IMPLANT
ELECT REM PT RETURN 9FT ADLT (ELECTROSURGICAL) ×2
ELECTRODE REM PT RTRN 9FT ADLT (ELECTROSURGICAL) ×1 IMPLANT
GLOVE BIO SURGEON STRL SZ8 (GLOVE) ×2 IMPLANT
GOWN STRL REUS W/ TWL LRG LVL3 (GOWN DISPOSABLE) ×2 IMPLANT
GOWN STRL REUS W/TWL LRG LVL3 (GOWN DISPOSABLE) ×2
IRRIGATION STRYKERFLOW (MISCELLANEOUS) ×1 IMPLANT
IRRIGATOR STRYKERFLOW (MISCELLANEOUS) ×2
KIT TURNOVER KIT A (KITS) ×2 IMPLANT
LABEL OR SOLS (LABEL) ×2 IMPLANT
NEEDLE HYPO 22GX1.5 SAFETY (NEEDLE) ×2 IMPLANT
NEEDLE VERESS 14GA 120MM (NEEDLE) ×2 IMPLANT
NS IRRIG 500ML POUR BTL (IV SOLUTION) ×2 IMPLANT
PACK LAP CHOLECYSTECTOMY (MISCELLANEOUS) ×2 IMPLANT
POUCH SPECIMEN RETRIEVAL 10MM (ENDOMECHANICALS) ×2 IMPLANT
RELOAD 45 VASCULAR/THIN (ENDOMECHANICALS) ×2 IMPLANT
RELOAD STAPLE TA45 3.5 REG BLU (ENDOMECHANICALS) ×2 IMPLANT
SCISSORS METZENBAUM CVD 33 (INSTRUMENTS) IMPLANT
SLEEVE ENDOPATH XCEL 5M (ENDOMECHANICALS) ×2 IMPLANT
SOL .9 NS 3000ML IRR  AL (IV SOLUTION) ×1
SOL .9 NS 3000ML IRR UROMATIC (IV SOLUTION) ×1 IMPLANT
SPONGE GAUZE 2X2 8PLY STRL LF (GAUZE/BANDAGES/DRESSINGS) ×6 IMPLANT
SPONGE LAP 18X18 RF (DISPOSABLE) ×2 IMPLANT
STRIP CLOSURE SKIN 1/2X4 (GAUZE/BANDAGES/DRESSINGS) ×2 IMPLANT
SUT MNCRL 4-0 (SUTURE) ×1
SUT MNCRL 4-0 27XMFL (SUTURE) ×1
SUT VICRYL 0 TIES 12 18 (SUTURE) ×2 IMPLANT
SUTURE MNCRL 4-0 27XMF (SUTURE) ×1 IMPLANT
TRAY FOLEY MTR SLVR 16FR STAT (SET/KITS/TRAYS/PACK) ×2 IMPLANT
TROCAR XCEL 12X100 BLDLESS (ENDOMECHANICALS) ×2 IMPLANT
TROCAR XCEL NON-BLD 5MMX100MML (ENDOMECHANICALS) ×2 IMPLANT
TUBING INSUFFLATION (TUBING) ×2 IMPLANT

## 2017-12-18 NOTE — Progress Notes (Signed)
Preoperative Review   Patient is met in the preoperative holding area. The history is reviewed in the chart and with the patient. I personally reviewed the options and rationale as well as the risks of this procedure that have been previously discussed with the patient. All questions asked by the patient and/or family were answered to their satisfaction.  Patient agrees to proceed with this procedure at this time.  Florene Glen M.D. FACS

## 2017-12-18 NOTE — Progress Notes (Signed)
ANTIBIOTIC CONSULT NOTE - INITIAL  Pharmacy Consult for Zosyn  Indication: appendectomy  No Known Allergies  Patient Measurements: Height: 5\' 7"  (170.2 cm) Weight: 190 lb (86.2 kg) IBW/kg (Calculated) : 66.1 Adjusted Body Weight:   Vital Signs: Temp: 99 F (37.2 C) (07/10 0451) Temp Source: Oral (07/10 0451) BP: 140/91 (07/10 0451) Pulse Rate: 93 (07/10 0451) Intake/Output from previous day: 07/09 0701 - 07/10 0700 In: 1050 [IV Piggyback:1050] Out: -  Intake/Output from this shift: No intake/output data recorded.  Labs: Recent Labs    12/18/17 0103 12/18/17 0503  WBC 18.6* 17.5*  HGB 17.4 16.3  PLT 256 219  CREATININE 1.12 0.96   Estimated Creatinine Clearance: 83.6 mL/min (by C-G formula based on SCr of 0.96 mg/dL). No results for input(s): VANCOTROUGH, VANCOPEAK, VANCORANDOM, GENTTROUGH, GENTPEAK, GENTRANDOM, TOBRATROUGH, TOBRAPEAK, TOBRARND, AMIKACINPEAK, AMIKACINTROU, AMIKACIN in the last 72 hours.   Microbiology: Recent Results (from the past 720 hour(s))  MRSA PCR Screening     Status: None   Collection Time: 12/18/17  5:12 AM  Result Value Ref Range Status   MRSA by PCR NEGATIVE NEGATIVE Final    Comment:        The GeneXpert MRSA Assay (FDA approved for NASAL specimens only), is one component of a comprehensive MRSA colonization surveillance program. It is not intended to diagnose MRSA infection nor to guide or monitor treatment for MRSA infections. Performed at [facility], [address] [place], [place] [address]     Medical History: Past Medical History:  Diagnosis Date  . 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  . Back ache 05/05/2013  . Diabetes mellitus without complication ([place])   . GERD (gastroesophageal reflux disease)   . Headache   . Hyperlipemia   . Hypertension   . Kidney stones   . Kidney stones   . Nephrolithiasis   . Shortness of breath dyspnea     Medications:  Medications Prior to Admission  Medication Sig  Dispense Refill Last Dose  . acetaminophen (TYLENOL) 500 MG tablet Take 1,000 mg by mouth every 6 (six) hours as needed (for pain.).   prn at prn  . albuterol (PROVENTIL HFA;VENTOLIN HFA) 108 (90 Base) MCG/ACT inhaler Inhale 2 puffs into the lungs every 6 (six) hours as needed for wheezing or shortness of breath. 1 Inhaler 2 prn at prn  . atorvastatin (LIPITOR) 40 MG tablet Take 1 tablet (40 mg total) by mouth at bedtime. 30 tablet 5 unknown at unknown  . chlorthalidone (HYGROTON) 50 MG tablet Take 50 mg by mouth daily.   unknown at unknown  . gabapentin (NEURONTIN) 300 MG capsule Take 300 mg by mouth 3 (three) times daily.   unknown at unknown  . lisinopril (PRINIVIL,ZESTRIL) 20 MG tablet Take 1 tablet (20 mg total) by mouth daily. Reported on 12/08/2015 30 tablet 5 unknown at unknown  . [START ON 12/30/2017] Oxycodone HCl 10 MG TABS Take 1 tablet (10 mg total) by mouth every 6 (six) hours as needed. 120 tablet 0 unknown at unknown  . potassium chloride (K-DUR) 10 MEQ tablet Take 1 tablet (10 mEq total) by mouth daily. 7 tablet 0 unknown at unknown  . pregabalin (LYRICA) 25 MG capsule Take 25 mg by mouth 2 (two) times daily.   unknown at unknown  . ranitidine (ZANTAC) 150 MG tablet Take 150 mg by mouth daily.   unknown at unknown  . sitaGLIPtin (JANUVIA) 50 MG tablet Take 1 tablet (50 mg total) by mouth daily. [place]  tablet 5 unknown at unknown  . tamsulosin (FLOMAX) 0.4 MG CAPS capsule Take 1 capsule (0.4 mg total) by mouth daily after supper. 30 capsule 3 unknown at unknown   Assessment: CrCl = 83.6 ml/min  Goal of Therapy:  resolution of infection  Plan:  Expected duration 7 days with resolution of temperature and/or normalization of WBC   Zosyn 3.375 gm IV X 1 given in ED on 7/10 @ 0300. Zosyn 3.375 gm IV Q8H EI ordered to start on 7/10 @ 900.   Dalynn Jhaveri D 12/18/2017,7:28 AM

## 2017-12-18 NOTE — Discharge Instructions (Signed)
Remove dressing in 24 hours. °May shower in 24 hours. °Leave paper strips in place. °Resume all home medications. °Follow-up with Dr. Sevin Langenbach in 10 days. °

## 2017-12-18 NOTE — Anesthesia Procedure Notes (Signed)
Procedure Name: Intubation Performed by: Lance Muss, CRNA Pre-anesthesia Checklist: Patient identified, Patient being monitored, Timeout performed, Emergency Drugs available and Suction available Patient Re-evaluated:Patient Re-evaluated prior to induction Oxygen Delivery Method: Circle system utilized Preoxygenation: Pre-oxygenation with 100% oxygen Induction Type: IV induction Ventilation: Mask ventilation without difficulty Laryngoscope Size: 3 and McGraph Grade View: Grade I Tube type: Oral Tube size: 7.5 mm Number of attempts: 1 Airway Equipment and Method: Stylet Placement Confirmation: ETT inserted through vocal cords under direct vision,  positive ETCO2 and breath sounds checked- equal and bilateral Secured at: 21 cm Tube secured with: Tape Dental Injury: Teeth and Oropharynx as per pre-operative assessment

## 2017-12-18 NOTE — Anesthesia Postprocedure Evaluation (Signed)
Anesthesia Post Note  Patient: Howard Murphy  Procedure(s) Performed: APPENDECTOMY LAPAROSCOPIC (N/A )  Patient location during evaluation: PACU Anesthesia Type: General Level of consciousness: awake and alert Pain management: pain level controlled Vital Signs Assessment: post-procedure vital signs reviewed and stable Respiratory status: spontaneous breathing, nonlabored ventilation and respiratory function stable Cardiovascular status: blood pressure returned to baseline and stable Postop Assessment: no apparent nausea or vomiting Anesthetic complications: no     Last Vitals:  Vitals:   12/18/17 1214 12/18/17 1224  BP: 102/67 102/67  Pulse: 86   Resp: 16   Temp: 36.5 C   SpO2: 94%     Last Pain:  Vitals:   12/18/17 1224  TempSrc:   PainSc: 4                  Marchetta Navratil Harvie Heck

## 2017-12-18 NOTE — ED Provider Notes (Signed)
[facility] Emergency Department Provider Note    First MD Initiated Contact with Patient 12/18/17 0139     (approximate)  I have reviewed the triage vital signs and the nursing notes.   HISTORY  Chief Complaint Flank Pain    HPI Howard Murphy is a 62 y.o. male history of kidney stones presents the ER with chief complaint of 2 days of nausea and generalized abdominal pain that started radiating down his right lower quadrant started today.  Patient states he typically does not come to the ER but had sudden onset of worsening pain this evening which brought him in.  States he was having chills but no measured fevers.  No vomiting.  No diarrhea.  Has never had pain quite like this before.    Past Medical History:  Diagnosis Date  . 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  . Back ache 05/05/2013  . Diabetes mellitus without complication ([place])   . GERD (gastroesophageal reflux disease)   . Headache   . Hyperlipemia   . Hypertension   . Kidney stones   . Kidney stones   . Nephrolithiasis   . Shortness of breath dyspnea    Family History  Problem Relation Age of Onset  . Cancer Mother        Breast CA  . Stroke Father   . Heart disease Father   . Cancer Sister   . Cancer Maternal Grandmother   . Cancer Sister   . Bladder Cancer Neg Hx   . Prostate cancer Neg Hx   . Kidney cancer Neg Hx    Past Surgical History:  Procedure Laterality Date  . BACK SURGERY  2015   x3  . BACK SURGERY  08/09/2016  . CHOLECYSTECTOMY  2000  . COLONOSCOPY WITH PROPOFOL N/A 04/11/2015   Procedure: COLONOSCOPY WITH PROPOFOL;  Surgeon: Manya Silvas, MD;  Location: [facility] ENDOSCOPY;  Service: Endoscopy;  Laterality: N/A;  . CYSTOSCOPY WITH URETEROSCOPY AND STENT PLACEMENT Right 08/27/2017   Procedure: CYSTOSCOPY WITH URETEROSCOPY AND STENT PLACEMENT;  Surgeon: Abbie Sons, MD;  Location: [facility] ORS;  Service: Urology;  Laterality: Right;  . ESOPHAGOGASTRODUODENOSCOPY  (EGD) WITH PROPOFOL N/A 04/11/2015   Procedure: ESOPHAGOGASTRODUODENOSCOPY (EGD) WITH PROPOFOL;  Surgeon: Manya Silvas, MD;  Location: [facility] ENDOSCOPY;  Service: Endoscopy;  Laterality: N/A;  . SHOULDER SURGERY Right 2016  . SHOULDER SURGERY Left 06/2015   Patient Active Problem List   Diagnosis Date Noted  . Acute postoperative pain 03/28/2017  . Medication monitoring encounter 03/07/2017  . 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 Spondylolisthesis at L3-L4 level 08/03/2016  . Chronic pain syndrome 07/09/2016  . Encounter for medication monitoring 02/24/2016  . Cervical spondylosis 12/17/2015  . Cervical facet arthropathy 12/17/2015  . Chronic neck pain (Bilateral) (R>L) 12/17/2015  . Cervical facet syndrome (Bilateral) (R>L) 12/17/2015  . Chronic upper extremity pain (Bilateral) (R>L) 12/17/2015  . Cervical foraminal stenosis 12/17/2015  .  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 Arthropathy of shoulder (Right) 12/17/2015  . Osteoarthritis 12/17/2015  . 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 Lumbar foraminal stenosis (L4-5) (Left) 12/17/2015  . Grade 1 Anterolisthesis (2 mm) of L4 over L5. 12/17/2015  . Lumbar facet (L4-5) synovial cyst (6 mm) (Left) 12/17/2015  . Chronic sacroiliac joint pain (Bilateral) (L>R) 12/08/2015  . Neurogenic pain 12/08/2015  . Abnormal MRI, lumbar spine 11/21/2015  .  Radicular pain of shoulder (Right) 10/03/2015  . Carpal tunnel syndrome (Bilateral) 10/03/2015  . Chronic cervical radicular pain (Right) 10/03/2015  . Chronic low back pain (Primary Source of Pain) (Bilateral) (L>R) 09/06/2015  . Opiate use (60 MME/Day) 09/05/2015  . Long term prescription opiate use 09/05/2015  . Long term current use of opiate analgesic 09/05/2015  . Spondylosis of lumbar spine 09/05/2015  . Lumbar facet joint syndrome ([place]) 09/05/2015  . Chronic  hip pain (Left) 09/05/2015  . Diabetic peripheral neuropathy ([place]) 09/05/2015  . Chronic lumbar radicular pain (S1 Dermatome) (Bilateral) (L>R) 09/05/2015  . Hypokalemia 09/05/2015  . Chronic shoulder pain (Primary Source of Pain) (Bilateral) (L>R) 09/05/2015  . Diabetes ([place]) 09/05/2015  . Chronic foot pain (Secondary source of pain) (Bilateral) (L>R) 09/05/2015  . Chronic hand pain [facility] source of pain) (Bilateral) (R>L) 09/05/2015  . Lumbar facet hypertrophy 09/05/2015  . Encounter for therapeutic drug level monitoring 09/05/2015  . Low testosterone 09/05/2015  . Occipital pain (Right) 09/05/2015  . 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 Hypertriglyceridemia 07/26/2015  . Chronic abdominal pain (RUQ) 07/14/2015  . Type 2 diabetes mellitus with peripheral neuropathy ([place]) 07/14/2015  . Chest pain 07/14/2015  . Complete tear of the shoulder rotator cuff (Left) 05/10/2015  . Complete tear of left rotator cuff 05/10/2015  . SOB (shortness of breath) 03/22/2015  . Elevated hemoglobin ([place]) 07/07/2013  . History of lumbar facet Synovial cyst, surgically removed. 05/29/2013    Class: History of  . 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 GERD (gastroesophageal reflux disease) 05/05/2013  . Benign prostatic hyperplasia 05/05/2013  . Hypertension 05/08/2011  . Hyperlipidemia 05/08/2011  . RA (rheumatoid arthritis) ([place]) 05/08/2011      Prior to Admission medications   Medication Sig Start Date End Date Taking? Authorizing Provider  acetaminophen (TYLENOL) 500 MG tablet Take 1,000 mg by mouth every 6 (six) hours as needed (for pain.).    [provider]  albuterol (PROVENTIL HFA;VENTOLIN HFA) 108 (90 Base) MCG/ACT inhaler Inhale 2 puffs into the lungs every 6 (six) hours as needed for wheezing or shortness of breath. 05/29/17   Fisher, Linden Dolin, PA-C  atorvastatin (LIPITOR) 40 MG tablet Take 1 tablet (40 mg total) by mouth at bedtime. 03/08/17   Arnetha Courser, MD    chlorthalidone (HYGROTON) 50 MG tablet Take 50 mg by mouth daily.    [provider]  gabapentin (NEURONTIN) 300 MG capsule Take 300 mg by mouth 3 (three) times daily.    [provider]  lisinopril (PRINIVIL,ZESTRIL) 20 MG tablet Take 1 tablet (20 mg total) by mouth daily. Reported on 12/08/2015 03/08/17   Arnetha Courser, MD  Oxycodone HCl 10 MG TABS Take 1 tablet (10 mg total) by mouth every 6 (six) hours as needed. 12/30/17 01/29/18  Vevelyn Francois, NP  Oxycodone HCl 10 MG TABS Take 1 tablet (10 mg total) by mouth every 6 (six) hours as needed. 11/30/17 12/30/17  Vevelyn Francois, NP  potassium chloride (K-DUR) 10 MEQ tablet Take 1 tablet (10 mEq total) by mouth daily. 08/23/17   Stoioff, Ronda Fairly, MD  pregabalin (LYRICA) 25 MG capsule Take 25 mg by mouth 2 (two) times daily.    [provider]  ranitidine (ZANTAC) 150 MG tablet Take 150 mg by mouth daily.    [provider]  sitaGLIPtin (JANUVIA) 50 MG tablet Take 1 tablet (50 mg total) by mouth daily. 03/08/17   Arnetha Courser, MD  tamsulosin (  FLOMAX) 0.4 MG CAPS capsule Take 1 capsule (0.4 mg total) by mouth daily after supper. 12/06/17   Festus Aloe, MD    Allergies Patient has no known allergies.    Social History Social History   Tobacco Use  . Smoking status: Never Smoker  . Smokeless tobacco: Former Systems developer    Types: Chew  Substance Use Topics  . Alcohol use: No    Frequency: Never  . Drug use: No    Review of Systems Patient denies headaches, rhinorrhea, blurry vision, numbness, shortness of breath, chest pain, edema, cough, abdominal pain, nausea, vomiting, diarrhea, dysuria, fevers, rashes or hallucinations unless otherwise stated above in HPI. ____________________________________________   PHYSICAL EXAM:  VITAL SIGNS: Vitals:   12/18/17 0104  BP: 131/87  Pulse: 81  Resp: 18  Temp: 98.1 F (36.7 C)  SpO2: 96%    Constitutional: Alert and oriented.  Eyes: Conjunctivae are  normal.  Head: Atraumatic. Nose: No congestion/rhinnorhea. Mouth/Throat: Mucous membranes are moist.   Neck: No stridor. Painless ROM.  Cardiovascular: Normal rate, regular rhythm. Grossly normal heart sounds.  Good peripheral circulation. Respiratory: Normal respiratory effort.  No retractions. Lungs CTAB. Gastrointestinal: Soft with RLQ rebound tenderness and guarding. No distention. No abdominal bruits. No CVA tenderness. Genitourinary: no hernia or swelling noted Musculoskeletal: No lower extremity tenderness nor edema.  No joint effusions. Neurologic:  Normal speech and language. No gross focal neurologic deficits are appreciated. No facial droop Skin:  Skin is warm, dry and intact. No rash noted. Psychiatric: Mood and affect are normal. Speech and behavior are normal.  ____________________________________________   LABS (all labs ordered are listed, but only abnormal results are displayed)  Results for orders placed or performed during the hospital encounter of 12/18/17 (from the past 24 hour(s))  CBC with Differential     Status: Abnormal   Collection Time: 12/18/17  1:03 AM  Result Value Ref Range   WBC 18.6 (H) 3.8 - 10.6 K/uL   RBC 5.83 4.40 - 5.90 MIL/uL   Hemoglobin 17.4 13.0 - 18.0 g/dL   HCT 50.8 40.0 - 52.0 %   MCV 87.2 80.0 - 100.0 fL   MCH 29.8 26.0 - 34.0 pg   MCHC 34.2 32.0 - 36.0 g/dL   RDW 14.9 (H) 11.5 - 14.5 %   Platelets 256 150 - 440 K/uL   Neutrophils Relative % 75 %   Neutro Abs 14.1 (H) 1.4 - 6.5 K/uL   Lymphocytes Relative 16 %   Lymphs Abs 2.9 1.0 - 3.6 K/uL   Monocytes Relative 7 %   Monocytes Absolute 1.4 (H) 0.2 - 1.0 K/uL   Eosinophils Relative 1 %   Eosinophils Absolute 0.1 0 - 0.7 K/uL   Basophils Relative 1 %   Basophils Absolute 0.1 0 - 0.1 K/uL  Comprehensive metabolic panel     Status: Abnormal   Collection Time: 12/18/17  1:03 AM  Result Value Ref Range   Sodium 137 135 - 145 mmol/L   Potassium 3.1 (L) 3.5 - 5.1 mmol/L   Chloride  100 98 - 111 mmol/L   CO2 27 22 - 32 mmol/L   Glucose, Bld 166 (H) 70 - 99 mg/dL   BUN 19 8 - 23 mg/dL   Creatinine, Ser 1.12 0.61 - 1.24 mg/dL   Calcium 9.0 8.9 - 10.3 mg/dL   Total Protein 7.5 6.5 - 8.1 g/dL   Albumin 4.4 3.5 - 5.0 g/dL   AST 36 15 - 41 U/L   ALT 36  0 - 44 U/L   Alkaline Phosphatase 45 38 - 126 U/L   Total Bilirubin 1.0 0.3 - 1.2 mg/dL   GFR calc non Af Amer >60 >60 mL/min   GFR calc Af Amer >60 >60 mL/min   Anion gap 10 5 - 15  Urinalysis, Complete w Microscopic     Status: Abnormal   Collection Time: 12/18/17  1:03 AM  Result Value Ref Range   Color, Urine YELLOW (A) YELLOW   APPearance CLEAR (A) CLEAR   Specific Gravity, Urine 1.040 (H) 1.005 - 1.030   pH 5.0 5.0 - 8.0   Glucose, UA NEGATIVE NEGATIVE mg/dL   Hgb urine dipstick NEGATIVE NEGATIVE   Bilirubin Urine NEGATIVE NEGATIVE   Ketones, ur NEGATIVE NEGATIVE mg/dL   Protein, ur 30 (A) NEGATIVE mg/dL   Nitrite NEGATIVE NEGATIVE   Leukocytes, UA TRACE (A) NEGATIVE   RBC / HPF 0-5 0 - 5 RBC/hpf   WBC, UA 6-10 0 - 5 WBC/hpf   Bacteria, UA NONE SEEN NONE SEEN   Squamous Epithelial / LPF 0-5 0 - 5   Mucus PRESENT    ____________________________________________ ____________________________________________  RADIOLOGY  I personally reviewed all radiographic images ordered to evaluate for the above acute complaints and reviewed radiology reports and findings.  These findings were personally discussed with the patient.  Please see medical record for radiology report.  ____________________________________________   PROCEDURES  Procedure(s) performed:  Procedures    Critical Care performed: no ____________________________________________   INITIAL IMPRESSION / ASSESSMENT AND PLAN / ED COURSE  Pertinent labs & imaging results that were available during my care of the patient were reviewed by me and considered in my medical decision making (see chart for details).   DDX: appy, stone, pyelo,  diverticulitis, abscess, perforation  Howard Murphy is a 62 y.o. who presents to the ED with right lower quadrant pain as described above.  Patient afebrile but does have leukocytosis to 18.  Exam is concerning for acute appendicitis.  CT imaging ordered for the above differential shows evidence of acute appendicitis.  Patient will be given IV antibiotics as well as additional IV fluids and IV pain medication IV antiemetics.  Spoke with Dr. Burt Knack of general surgery who kindly agrees to evaluate patient.      As part of my medical decision making, I reviewed the following data within the [place] notes reviewed and incorporated, Labs reviewed, notes from prior ED visits.   ____________________________________________   FINAL CLINICAL IMPRESSION(S) / ED DIAGNOSES  Final diagnoses:  Acute appendicitis, unspecified acute appendicitis type      NEW MEDICATIONS STARTED DURING THIS VISIT:  New Prescriptions   No medications on file     Note:  This document was prepared using Dragon voice recognition software and may include unintentional dictation errors.    Merlyn Lot, MD 12/18/17 [phone]

## 2017-12-18 NOTE — ED Triage Notes (Signed)
Pt reports last 2 days having nausea, chills and right side pain radiating into right lower abd

## 2017-12-18 NOTE — Op Note (Signed)
laparascopic appendectomy   [name] Date of operation:  12/18/2017  Indications: The patient presented with a history of  abdominal pain. Workup has revealed findings consistent with acute appendicitis.  Pre-operative Diagnosis: Acute appendicitis  Post-operative Diagnosis: Acute separative appendicitis nonruptured  Surgeon: Jerrol Banana. Burt Knack, MD, FACS  Anesthesia: General with endotracheal tube  Procedure Details  The patient was seen again in the preop area. The options of surgery versus observation were reviewed with the patient and/or family. The risks of bleeding, infection, recurrence of symptoms, negative laparoscopy, potential for an open procedure, bowel injury, abscess or infection, were all reviewed as well. The patient was taken to Operating Room, identified as KIM LAUVER and the procedure verified as laparoscopic appendectomy. A Time Out was held and the above information confirmed.  The patient was placed in the supine position and general anesthesia was induced.  Antibiotic prophylaxis was administered and VT E prophylaxis was in place. A Foley catheter was placed by the nursing staff.   The abdomen was prepped and draped in a sterile fashion. An infraumbilical incision was made. A Veress needle was placed and pneumoperitoneum was obtained. A 5 mm trocar port was placed without difficulty utilizing a Visiport technique.  And the abdominal cavity was explored.  There were adhesions near the umbilicus as well as in the left lower quadrant.  This did not impact the function of the ports.  Under direct vision a 5 mm suprapubic port was placed and a 13 mm left lateral port was placed all under direct vision.  The appendix was identified and found to be acutely inflamed it was inflamed and separative but nonruptured The appendix was carefully dissected. The base of the appendix was dissected out and divided with a standard load Endo GIA. The mesoappendix was divided with a  vascular load Endo GIA. The appendix was passed out through the left lateral port site with the aid of an Endo Catch bag. The right lower quadrant and pelvis was then irrigated with copious amounts of normal saline which was aspirated. Inspection  failed to identify any additional bleeding and there were no signs of bowel injury. Therefore the left lateral port site was closed under direct vision utilizing an Endo Close technique with 0 Vicryl interrupted sutures, all under direct vision.  This was somewhat difficult due to adhesions in the left lower quadrant and because only one suture had been placed with the Endo Close technique a UR 6-0 Vicryl was utilized externally to further close the fascia.  Again the right lower quadrant was inspected there was no sign of bleeding or bowel injury therefore pneumoperitoneum was released, all ports were removed and the skin incisions were approximated with subcuticular 4-0 Monocryl. Steri-Strips and Mastisol and sterile dressings were placed.  The patient tolerated the procedure well, there were no complications. The sponge lap and needle count were correct at the end of the procedure.  The patient was taken to the recovery room in stable condition to be admitted for continued care.  Findings: Acute appendicitis nonruptured  Estimated Blood Loss: Nil                  Specimens: appendix         Complications: None                  Richard E. Burt Knack MD, FACS

## 2017-12-18 NOTE — Transfer of Care (Signed)
Immediate Anesthesia Transfer of Care Note  Patient: Howard Murphy  Procedure(s) Performed: APPENDECTOMY LAPAROSCOPIC (N/A )  Patient Location: PACU  Anesthesia Type:General  Level of Consciousness: sedated and responds to stimulation  Airway & Oxygen Therapy: Patient Spontanous Breathing and Patient connected to face mask oxygen  Post-op Assessment: Report given to RN and Post -op Vital signs reviewed and stable  Post vital signs: Reviewed and stable  Last Vitals:  Vitals Value Taken Time  BP 120/73 12/18/2017 10:01 AM  Temp 37 C 12/18/2017 10:00 AM  Pulse 100 12/18/2017 10:01 AM  Resp 16 12/18/2017 10:01 AM  SpO2 99 % 12/18/2017 10:01 AM    Last Pain:  Vitals:   12/18/17 0827  TempSrc:   PainSc: 8       Patients Stated Pain Goal: 0 (40/35/24 8185)  Complications: No apparent anesthesia complications

## 2017-12-18 NOTE — Anesthesia Preprocedure Evaluation (Addendum)
Anesthesia Evaluation  Patient identified by MRN, date of birth, ID band Patient awake    Reviewed: Allergy & Precautions, H&P , NPO status , reviewed documented beta blocker date and time   Airway Mallampati: III  TM Distance: <3 FB Neck ROM: limited    Dental  (+) Chipped, Missing, Upper Dentures, Poor Dentition, Caps   Pulmonary shortness of breath,    Pulmonary exam normal        Cardiovascular hypertension, Normal cardiovascular exam     Neuro/Psych  Headaches,  Neuromuscular disease    GI/Hepatic GERD  Medicated and Controlled,  Endo/Other  diabetes  Renal/GU Renal disease     Musculoskeletal  (+) Arthritis ,   Abdominal   Peds  Hematology   Anesthesia Other Findings Past Medical History: No date: Arthritis     Comment:  Osteo vs rheumatoid ? 05/05/2013: Back ache No date: Diabetes mellitus without complication (HCC) No date: GERD (gastroesophageal reflux disease) No date: Headache No date: Hyperlipemia No date: Hypertension No date: Kidney stones No date: Kidney stones No date: Nephrolithiasis No date: Shortness of breath dyspnea Past Surgical History: 2015: BACK SURGERY     Comment:  x3 08/09/2016: BACK SURGERY 2000: CHOLECYSTECTOMY 04/11/2015: COLONOSCOPY WITH PROPOFOL; N/A     Comment:  Procedure: COLONOSCOPY WITH PROPOFOL;  Surgeon: Manya Silvas, MD;  Location: [place];  Service:               Endoscopy;  Laterality: N/A; 08/27/2017: CYSTOSCOPY WITH URETEROSCOPY AND STENT PLACEMENT; Right     Comment:  Procedure: CYSTOSCOPY WITH URETEROSCOPY AND STENT               PLACEMENT;  Surgeon: Abbie Sons, MD;  Location:               [facility] ORS;  Service: Urology;  Laterality: Right; 04/11/2015: ESOPHAGOGASTRODUODENOSCOPY (EGD) WITH PROPOFOL; N/A     Comment:  Procedure: ESOPHAGOGASTRODUODENOSCOPY (EGD) WITH               PROPOFOL;  Surgeon: Manya Silvas, MD;   Location: [facility]              ENDOSCOPY;  Service: Endoscopy;  Laterality: N/A; 2016: SHOULDER SURGERY; Right 06/2015: SHOULDER SURGERY; Left BMI    Body Mass Index:  29.76 kg/m     Reproductive/Obstetrics                            Anesthesia Physical Anesthesia Plan  ASA: III  Anesthesia Plan: General ETT   Post-op Pain Management:    Induction: Intravenous  PONV Risk Score and Plan: 2 and Ondansetron, Treatment may vary due to age or medical condition, Midazolam and Dexamethasone  Airway Management Planned:   Additional Equipment:   Intra-op Plan:   Post-operative Plan: Extubation in OR  Informed Consent: I have reviewed the patients History and Physical, chart, labs and discussed the procedure including the risks, benefits and alternatives for the proposed anesthesia with the patient or authorized representative who has indicated his/her understanding and acceptance.   Dental Advisory Given  Plan Discussed with: CRNA  Anesthesia Plan Comments:         Anesthesia Quick Evaluation

## 2017-12-18 NOTE — Anesthesia Post-op Follow-up Note (Signed)
Anesthesia QCDR form completed.        

## 2017-12-18 NOTE — H&P (Signed)
Howard Murphy is an 62 y.o. male.    Chief Complaint: Right lower quadrant pain  HPI: This patient with approximately 24 hours to 36 hours of right lower quadrant abdominal pain.  He is never had an episode like this before.  He denies fevers or chills and is nauseated but has not vomited.  He points the area of McBurney's point.  He is disabled due to back pain and chronic back pain with narcotic use.  He has diabetes hypertension hypercholesterolemia.  He has had his gallbladder removed laparoscopically.  Past Medical History:  Diagnosis Date  . 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  . Back ache 05/05/2013  . Diabetes mellitus without complication ([place])   . GERD (gastroesophageal reflux disease)   . Headache   . Hyperlipemia   . Hypertension   . Kidney stones   . Kidney stones   . Nephrolithiasis   . Shortness of breath dyspnea     Past Surgical History:  Procedure Laterality Date  . BACK SURGERY  2015   x3  . BACK SURGERY  08/09/2016  . CHOLECYSTECTOMY  2000  . COLONOSCOPY WITH PROPOFOL N/A 04/11/2015   Procedure: COLONOSCOPY WITH PROPOFOL;  Surgeon: Manya Silvas, MD;  Location: [facility] ENDOSCOPY;  Service: Endoscopy;  Laterality: N/A;  . CYSTOSCOPY WITH URETEROSCOPY AND STENT PLACEMENT Right 08/27/2017   Procedure: CYSTOSCOPY WITH URETEROSCOPY AND STENT PLACEMENT;  Surgeon: Abbie Sons, MD;  Location: [facility] ORS;  Service: Urology;  Laterality: Right;  . ESOPHAGOGASTRODUODENOSCOPY (EGD) WITH PROPOFOL N/A 04/11/2015   Procedure: ESOPHAGOGASTRODUODENOSCOPY (EGD) WITH PROPOFOL;  Surgeon: Manya Silvas, MD;  Location: [facility] ENDOSCOPY;  Service: Endoscopy;  Laterality: N/A;  . SHOULDER SURGERY Right 2016  . SHOULDER SURGERY Left 06/2015    Family History  Problem Relation Age of Onset  . Cancer Mother        Breast CA  . Stroke Father   . Heart disease Father   . Cancer Sister   . Cancer Maternal Grandmother   . Cancer Sister   . Bladder Cancer Neg Hx   .  Prostate cancer Neg Hx   . Kidney cancer Neg Hx    Social History:  reports that he has never smoked. He quit smokeless tobacco use about 4 years ago. His smokeless tobacco use included chew. He reports that he does not drink alcohol or use drugs.  Allergies: No Known Allergies   (Not in a hospital admission)   Review of Systems  Constitutional: Negative.   HENT: Negative.   Eyes: Negative.   Respiratory: Negative.   Cardiovascular: Negative.   Gastrointestinal: Positive for abdominal pain and nausea. Negative for blood in stool, constipation, diarrhea, heartburn and vomiting.  Genitourinary: Negative.   Musculoskeletal: Negative.   Skin: Negative.   Neurological: Negative.   Endo/Heme/Allergies: Negative.   Psychiatric/Behavioral: Negative.      Physical Exam:  BP 131/87 (BP Location: Left Arm)   Pulse 81   Temp 98.1 F (36.7 C) (Oral)   Resp 18   Ht '5\' 7"'  (1.702 m)   Wt 190 lb (86.2 kg)   SpO2 96%   BMI 29.76 kg/m   Physical Exam  Constitutional: He is oriented to person, place, and time. He appears well-developed and well-nourished. No distress.  HENT:  Head: Normocephalic and atraumatic.  Eyes: Pupils are equal, round, and reactive to light. EOM are normal.  Neck: Normal range of motion. Neck supple.  Cardiovascular: Normal rate, regular rhythm and normal heart  sounds.  Pulmonary/Chest: Effort normal and breath sounds normal. No respiratory distress.  Abdominal: Soft. He exhibits no distension and no mass. There is tenderness. There is no guarding.  Maximal tenderness at McBurney's point with positive Rovsing sign  Neurological: He is alert and oriented to person, place, and time.  Skin: Skin is warm and dry. He is not diaphoretic.  Vitals reviewed.       Results for orders placed or performed during the hospital encounter of 12/18/17 (from the past 48 hour(s))  CBC with Differential     Status: Abnormal   Collection Time: 12/18/17  1:03 AM  Result  Value Ref Range   WBC 18.6 (H) 3.8 - 10.6 K/uL   RBC 5.83 4.40 - 5.90 MIL/uL   Hemoglobin 17.4 13.0 - 18.0 g/dL   HCT 50.8 40.0 - 52.0 %   MCV 87.2 80.0 - 100.0 fL   MCH 29.8 26.0 - 34.0 pg   MCHC 34.2 32.0 - 36.0 g/dL   RDW 14.9 (H) 11.5 - 14.5 %   Platelets 256 150 - 440 K/uL   Neutrophils Relative % 75 %   Neutro Abs 14.1 (H) 1.4 - 6.5 K/uL   Lymphocytes Relative 16 %   Lymphs Abs 2.9 1.0 - 3.6 K/uL   Monocytes Relative 7 %   Monocytes Absolute 1.4 (H) 0.2 - 1.0 K/uL   Eosinophils Relative 1 %   Eosinophils Absolute 0.1 0 - 0.7 K/uL   Basophils Relative 1 %   Basophils Absolute 0.1 0 - 0.1 K/uL    Comment: Performed at [facility], [address] [place], [place] [address]  Comprehensive metabolic panel     Status: Abnormal   Collection Time: 12/18/17  1:03 AM  Result Value Ref Range   Sodium 137 135 - 145 mmol/L   Potassium 3.1 (L) 3.5 - 5.1 mmol/L   Chloride 100 98 - 111 mmol/L    Comment: Please note change in reference range.   CO2 27 22 - 32 mmol/L   Glucose, Bld 166 (H) 70 - 99 mg/dL    Comment: Please note change in reference range.   BUN 19 8 - 23 mg/dL    Comment: Please note change in reference range.   Creatinine, Ser 1.12 0.61 - 1.24 mg/dL   Calcium 9.0 8.9 - 10.3 mg/dL   Total Protein 7.5 6.5 - 8.1 g/dL   Albumin 4.4 3.5 - 5.0 g/dL   AST 36 15 - 41 U/L   ALT 36 0 - 44 U/L    Comment: Please note change in reference range.   Alkaline Phosphatase 45 38 - 126 U/L   Total Bilirubin 1.0 0.3 - 1.2 mg/dL   GFR calc non Af Amer >60 >60 mL/min   GFR calc Af Amer >60 >60 mL/min    Comment: (NOTE) The eGFR has been calculated using the CKD EPI equation. This calculation has not been validated in all clinical situations. eGFR's persistently <60 mL/min signify possible Chronic Kidney Disease.    Anion gap 10 5 - 15    Comment: Performed at [facility], [address] [place], [place] [address]  Urinalysis, Complete w Microscopic      Status: Abnormal   Collection Time: 12/18/17  1:03 AM  Result Value Ref Range   Color, Urine YELLOW (A) YELLOW   APPearance CLEAR (A) CLEAR   Specific Gravity, Urine 1.040 (H) 1.005 - 1.030   pH 5.0 5.0 - 8.0   Glucose, UA NEGATIVE  NEGATIVE mg/dL   Hgb urine dipstick NEGATIVE NEGATIVE   Bilirubin Urine NEGATIVE NEGATIVE   Ketones, ur NEGATIVE NEGATIVE mg/dL   Protein, ur 30 (A) NEGATIVE mg/dL   Nitrite NEGATIVE NEGATIVE   Leukocytes, UA TRACE (A) NEGATIVE   RBC / HPF 0-5 0 - 5 RBC/hpf   WBC, UA 6-10 0 - 5 WBC/hpf   Bacteria, UA NONE SEEN NONE SEEN   Squamous Epithelial / LPF 0-5 0 - 5   Mucus PRESENT     Comment: Performed at [facility], [address], [place], [place] [address]   Ct Abdomen Pelvis W Contrast  Result Date: 12/18/2017 CLINICAL DATA:  Acute abdominal pain. Nausea and right-sided pain. Fever. EXAM: CT ABDOMEN AND PELVIS WITH CONTRAST TECHNIQUE: Multidetector CT imaging of the abdomen and pelvis was performed using the standard protocol following bolus administration of intravenous contrast. CONTRAST:  151m OMNIPAQUE IOHEXOL 300 MG/ML  SOLN COMPARISON:  Multiple prior exams most recent CT 09/23/2017 FINDINGS: Lower chest: Mild right middle and lower lobe scarring. No acute findings. No consolidation. Hepatobiliary: Diffusely decreased hepatic density consistent with steatosis. No discrete focal lesion. Clips in the gallbladder fossa postcholecystectomy. No biliary dilatation. Pancreas: No ductal dilatation or inflammation. Spleen: Normal in size without focal abnormality. Adrenals/Urinary Tract: Normal adrenal glands. No hydronephrosis or perinephric edema. Homogeneous renal enhancement with symmetric excretion on delayed phase imaging. Urinary bladder is physiologically distended without wall thickening. Stomach/Bowel: The appendix is dilated measuring 11 mm, fluid-filled with mild periappendiceal fat stranding. No perforation or abscess. Equivocal gastric wall  thickening which appears similar to prior exam. No small bowel dilatation, inflammation or obstruction. Mild colonic diverticulosis without diverticulitis. Vascular/Lymphatic: Mild aortic atherosclerosis without aneurysm. Incidental circumaortic left renal vein. No enlarged abdominopelvic lymph nodes. Reproductive: Prostatic calcifications. Other: No free air, free fluid, or intra-abdominal fluid collection. Small fat containing umbilical hernia. Fat in the left inguinal canal. Musculoskeletal: Posterior L3-L5 fusion with interbody spacers. There are no acute or suspicious osseous abnormalities. IMPRESSION: 1. Uncomplicated acute appendicitis. 2. Chronic findings include mild hepatic steatosis, colonic diverticulosis, and Aortic Atherosclerosis (ICD10-I70.0). Electronically Signed   By: MJeb LeveringM.D.   On: 12/18/2017 02:44     Assessment/Plan  Elevated white blood cell count.  White blood cell count and other labs as well as CT scan are personally reviewed showing appendicitis.  Recommend laparoscopic appendectomy later today.  Discussed with him the rationale for offering surgery and the options of observation or antibiotic therapy.  We also discussed the risks of bleeding infection recurrence of symptoms negative laparoscopy or conversion to an open procedure.  He understood and agreed to proceed questions were answered for he and his wife.  RFlorene Glen MD, FACS

## 2017-12-19 ENCOUNTER — Encounter: Payer: Self-pay | Admitting: Surgery

## 2017-12-19 ENCOUNTER — Other Ambulatory Visit: Payer: Self-pay

## 2017-12-19 LAB — HIV ANTIBODY (ROUTINE TESTING W REFLEX): HIV SCREEN 4TH GENERATION: NONREACTIVE

## 2017-12-19 LAB — SURGICAL PATHOLOGY

## 2017-12-19 MED ORDER — ONDANSETRON 4 MG PO TBDP: 4.0000 mg | ORAL_TABLET | Freq: Three times a day (TID) | ORAL | 0 refills | Status: DC | PRN

## 2017-12-19 MED ORDER — OXYCODONE-ACETAMINOPHEN 5-325 MG PO TABS: 1.0000 | ORAL_TABLET | Freq: Four times a day (QID) | ORAL | 0 refills | Status: DC | PRN

## 2017-12-19 NOTE — Progress Notes (Signed)
1 Day Post-Op  Subjective: Status post laparoscopic appendectomy for nonruptured appendix. Patient states he still has some mild right lower quadrant pain.  Minimal if any left lower quadrant incisional pain.  He is nauseated but has not vomited.  He requested Zofran for discharge.  No fevers or chills  Objective: Vital signs in last 24 hours: Temp:  [97.5 F (36.4 C)-99 F (37.2 C)] 97.5 F (36.4 C) (07/11 0421) Pulse Rate:  [70-100] 70 (07/11 0421) Resp:  [12-20] 20 (07/11 0421) BP: (92-120)/(53-73) 93/57 (07/11 0421) SpO2:  [90 %-100 %] 94 % (07/11 0421) Last BM Date: 12/17/17  Intake/Output from previous day: 07/10 0701 - 07/11 0700 In: 300 [I.V.:300] Out: 181 [Urine:176; Blood:5] Intake/Output this shift: No intake/output data recorded.  Physical exam:  Abdomen is soft nondistended nontympanitic and only minimally tender diffusely mostly right lower quadrant and left lower quadrant incisional.  Incisions are dressed.  Lab Results: CBC  Recent Labs    12/18/17 0103 12/18/17 0503  WBC 18.6* 17.5*  HGB 17.4 16.3  HCT 50.8 47.3  PLT 256 219   BMET Recent Labs    12/18/17 0103 12/18/17 0503  NA 137  --   K 3.1*  --   CL 100  --   CO2 27  --   GLUCOSE 166*  --   BUN 19  --   CREATININE 1.12 0.96  CALCIUM 9.0  --    PT/INR No results for input(s): LABPROT, INR in the last 72 hours. ABG No results for input(s): PHART, HCO3 in the last 72 hours.  Invalid input(s): PCO2, PO2  Studies/Results: Ct Abdomen Pelvis W Contrast  Result Date: 12/18/2017 CLINICAL DATA:  Acute abdominal pain. Nausea and right-sided pain. Fever. EXAM: CT ABDOMEN AND PELVIS WITH CONTRAST TECHNIQUE: Multidetector CT imaging of the abdomen and pelvis was performed using the standard protocol following bolus administration of intravenous contrast. CONTRAST:  124mL OMNIPAQUE IOHEXOL 300 MG/ML  SOLN COMPARISON:  Multiple prior exams most recent CT 09/23/2017 FINDINGS: Lower chest: Mild right  middle and lower lobe scarring. No acute findings. No consolidation. Hepatobiliary: Diffusely decreased hepatic density consistent with steatosis. No discrete focal lesion. Clips in the gallbladder fossa postcholecystectomy. No biliary dilatation. Pancreas: No ductal dilatation or inflammation. Spleen: Normal in size without focal abnormality. Adrenals/Urinary Tract: Normal adrenal glands. No hydronephrosis or perinephric edema. Homogeneous renal enhancement with symmetric excretion on delayed phase imaging. Urinary bladder is physiologically distended without wall thickening. Stomach/Bowel: The appendix is dilated measuring 11 mm, fluid-filled with mild periappendiceal fat stranding. No perforation or abscess. Equivocal gastric wall thickening which appears similar to prior exam. No small bowel dilatation, inflammation or obstruction. Mild colonic diverticulosis without diverticulitis. Vascular/Lymphatic: Mild aortic atherosclerosis without aneurysm. Incidental circumaortic left renal vein. No enlarged abdominopelvic lymph nodes. Reproductive: Prostatic calcifications. Other: No free air, free fluid, or intra-abdominal fluid collection. Small fat containing umbilical hernia. Fat in the left inguinal canal. Musculoskeletal: Posterior L3-L5 fusion with interbody spacers. There are no acute or suspicious osseous abnormalities. IMPRESSION: 1. Uncomplicated acute appendicitis. 2. Chronic findings include mild hepatic steatosis, colonic diverticulosis, and Aortic Atherosclerosis (ICD10-I70.0). Electronically Signed   By: Jeb Levering M.D.   On: 12/18/2017 02:44    Anti-infectives: Anti-infectives (From admission, onward)   Start     Dose/Rate Route Frequency Ordered Stop   12/18/17 0900  piperacillin-tazobactam (ZOSYN) IVPB 3.375 g     3.375 g 12.5 mL/hr over 240 Minutes Intravenous Every 8 hours 12/18/17 0728     12/18/17  0830  piperacillin-tazobactam (ZOSYN) 3.375 GM/50ML IVPB    Note to Pharmacy:   Norton Blizzard  : cabinet override      12/18/17 0830 12/18/17 0914   12/18/17 0300  piperacillin-tazobactam (ZOSYN) IVPB 3.375 g     3.375 g 100 mL/hr over 30 Minutes Intravenous  Once 12/18/17 0252 12/18/17 0406      Assessment/Plan: s/p Procedure(s): APPENDECTOMY LAPAROSCOPIC   Patient doing well but has some nausea and some right lower quadrant abdominal pain.  Of note he takes 10 mg oxycodone at home for his back pain. I will advance his diet today and see how he does with diet and nausea control and possibly discharge later today otherwise he would stay over as he would not be able to go home.  Florene Glen, MD, FACS  12/19/2017

## 2017-12-19 NOTE — Progress Notes (Addendum)
Discharge teaching given to patient, patient verbalized understanding and had no questions. Patient IV removed. Patient will be transported home by family. All patient belongings gathered prior to leaving. Patient's wife instructed to contact Dr. Antionette Char office to have prescription for zofran sent to pharmacy per Dr. Burt Knack.

## 2017-12-19 NOTE — Progress Notes (Signed)
Patient feels well his nausea is resolved and he is tolerating a regular diet.  He has less abdominal pain and feels well and wants to be discharged.  Abdomen soft nontender  Wounds dressed  Patient doing very well and wants to be discharged afternoon I gave him the option of staying overnight but he prefers to be discharged.  Directions are in the chart.

## 2017-12-20 ENCOUNTER — Telehealth: Payer: Self-pay | Admitting: Surgery

## 2017-12-20 NOTE — Telephone Encounter (Signed)
I called Dr. Burt Knack to let him know what was going and he stated that if the patient did not have any more pain medications from his other provider, then he does not need to have his prescription filled. I then called the pharmacist to let them know and he stated that patient still had pain medications from his pain management doctor. Therefore, I told them that the patient does not need Dr. Antionette Char prescription filled. They agreed and had no further questions. Zofran medication was e-scribed and was already picked up by patient according to the pharmacist.

## 2017-12-20 NOTE — Telephone Encounter (Signed)
Patients pharmacy  Ronalee Belts is calling from [facility] in [place] has questions about the percocet that was written by Dr. Burt Knack, patient picked up 10mg  for 120 pills of oxycodone on 12/03/17, patient also picked up another supply of the oxycodone that was prescribe by another doctor on 12/03/17. Ronalee Belts said seems like the patient has plenty of pain pills at home, does Dr. Burt Knack still want them to fill the medication. Please call Ronalee Belts at [facility] at [phone].

## 2017-12-25 ENCOUNTER — Other Ambulatory Visit: Payer: Self-pay

## 2017-12-26 ENCOUNTER — Other Ambulatory Visit: Payer: Self-pay

## 2017-12-26 ENCOUNTER — Ambulatory Visit: Payer: Medicare Other | Attending: Pain Medicine | Admitting: Pain Medicine

## 2017-12-26 ENCOUNTER — Encounter: Payer: Self-pay | Admitting: Pain Medicine

## 2017-12-26 ENCOUNTER — Ambulatory Visit: Admission: RE | Admit: 2017-12-26 | Payer: Medicare Other | Source: Ambulatory Visit

## 2017-12-26 VITALS — BP 118/91 | HR 86 | Temp 97.8°F | Ht 67.0 in | Wt 190.0 lb

## 2017-12-26 DIAGNOSIS — M5388 Other specified dorsopathies, sacral and sacrococcygeal region: Secondary | ICD-10-CM | POA: Insufficient documentation

## 2017-12-26 DIAGNOSIS — G894 Chronic pain syndrome: Secondary | ICD-10-CM | POA: Diagnosis not present

## 2017-12-26 MED ORDER — LIDOCAINE HCL 2 % IJ SOLN
20.0000 mL | Freq: Once | INTRAMUSCULAR | Status: AC
  Administered 2017-12-26: 400 mg

## 2017-12-26 MED ORDER — ROPIVACAINE HCL 2 MG/ML IJ SOLN: 9.0000 mL | Freq: Once | INTRAMUSCULAR | Status: DC

## 2017-12-26 MED ORDER — LIDOCAINE HCL 2 % IJ SOLN
INTRAMUSCULAR | Status: AC
  Filled 2017-12-26: qty 20

## 2017-12-26 MED ORDER — FENTANYL CITRATE (PF) 100 MCG/2ML IJ SOLN: 25.0000 ug | INTRAMUSCULAR | Status: DC | PRN

## 2017-12-26 MED ORDER — MIDAZOLAM HCL 5 MG/5ML IJ SOLN
INTRAMUSCULAR | Status: AC
Start: 2017-12-26 — End: ?
  Filled 2017-12-26: qty 5

## 2017-12-26 MED ORDER — ROPIVACAINE HCL 2 MG/ML IJ SOLN
INTRAMUSCULAR | Status: AC
  Filled 2017-12-26: qty 10

## 2017-12-26 MED ORDER — FENTANYL CITRATE (PF) 100 MCG/2ML IJ SOLN
INTRAMUSCULAR | Status: AC
  Filled 2017-12-26: qty 2

## 2017-12-26 MED ORDER — OXYCODONE HCL 10 MG PO TABS: 10.0000 mg | ORAL_TABLET | Freq: Four times a day (QID) | ORAL | 0 refills | Status: DC | PRN

## 2017-12-26 MED ORDER — LACTATED RINGERS IV SOLN: 1000.0000 mL | Freq: Once | INTRAVENOUS | Status: DC

## 2017-12-26 MED ORDER — TRIAMCINOLONE ACETONIDE 40 MG/ML IJ SUSP
INTRAMUSCULAR | Status: AC
  Filled 2017-12-26: qty 1

## 2017-12-26 MED ORDER — TRIAMCINOLONE ACETONIDE 40 MG/ML IJ SUSP: 40.0000 mg | Freq: Once | INTRAMUSCULAR | Status: DC

## 2017-12-26 MED ORDER — MIDAZOLAM HCL 5 MG/5ML IJ SOLN: 1.0000 mg | INTRAMUSCULAR | Status: DC | PRN

## 2017-12-26 NOTE — Patient Instructions (Addendum)
____________________________________________________________________________________________  Preparing for Procedure with Sedation  Instructions: . Oral Intake: Do not eat or drink anything for at least 8 hours prior to your procedure. . Transportation: Public transportation is not allowed. Bring an adult driver. The driver must be physically present in our waiting room before any procedure can be started. . Physical Assistance: Bring an adult physically capable of assisting you, in the event you need help. This adult should keep you company at home for at least 6 hours after the procedure. . Blood Pressure Medicine: Take your blood pressure medicine with a sip of water the morning of the procedure. . Blood thinners: Notify our staff if you are taking any blood thinners. Depending on which one you take, there will be specific instructions on how and when to stop it. . Diabetics on insulin: Notify the staff so that you can be scheduled 1st case in the morning. If your diabetes requires high dose insulin, take only  of your normal insulin dose the morning of the procedure and notify the staff that you have done so. . Preventing infections: Shower with an antibacterial soap the morning of your procedure. . Build-up your immune system: Take 1000 mg of Vitamin C with every meal (3 times a day) the day prior to your procedure. . Antibiotics: Inform the staff if you have a condition or reason that requires you to take antibiotics before dental procedures. . Pregnancy: If you are pregnant, call and cancel the procedure. . Sickness: If you have a cold, fever, or any active infections, call and cancel the procedure. . Arrival: You must be in the facility at least 30 minutes prior to your scheduled procedure. . Children: Do not bring children with you. . Dress appropriately: Bring dark clothing that you would not mind if they get stained. . Valuables: Do not bring any jewelry or valuables.  Procedure  appointments are reserved for interventional treatments only. . No Prescription Refills. . No medication changes will be discussed during procedure appointments. . No disability issues will be discussed.  Reasons to call and reschedule or cancel your procedure: (Following these recommendations will minimize the risk of a serious complication.) . Surgeries: Avoid having procedures within 2 weeks of any surgery. (Avoid for 2 weeks before or after any surgery). . Flu Shots: Avoid having procedures within 2 weeks of a flu shots or . (Avoid for 2 weeks before or after immunizations). . Barium: Avoid having a procedure within 7-10 days after having had a radiological study involving the use of radiological contrast. (Myelograms, Barium swallow or enema study). . Heart attacks: Avoid any elective procedures or surgeries for the initial 6 months after a "Myocardial Infarction" (Heart Attack). . Blood thinners: It is imperative that you stop these medications before procedures. Let us know if you if you take any blood thinner.  . Infection: Avoid procedures during or within two weeks of an infection (including chest colds or gastrointestinal problems). Symptoms associated with infections include: Localized redness, fever, chills, night sweats or profuse sweating, burning sensation when voiding, cough, congestion, stuffiness, runny nose, sore throat, diarrhea, nausea, vomiting, cold or Flu symptoms, recent or current infections. It is specially important if the infection is over the area that we intend to treat. . Heart and lung problems: Symptoms that may suggest an active cardiopulmonary problem include: cough, chest pain, breathing difficulties or shortness of breath, dizziness, ankle swelling, uncontrolled high or unusually low blood pressure, and/or palpitations. If you are experiencing any of these symptoms, cancel   your procedure and contact your primary care physician for an evaluation.  Remember:   Regular Business hours are:  Monday to Thursday 8:00 AM to 4:00 PM  Provider's Schedule: Francisco Naveira, MD:  Procedure days: Tuesday and Thursday 7:30 AM to 4:00 PM  Bilal Lateef, MD:  Procedure days: Monday and Wednesday 7:30 AM to 4:00 PM ____________________________________________________________________________________________    

## 2017-12-26 NOTE — Progress Notes (Signed)
The procedure was rescheduled due to the fact that the patient underwent an appendectomy for acute appendicitis less than 1 week ago.

## 2017-12-28 ENCOUNTER — Emergency Department
Admission: EM | Admit: 2017-12-28 | Discharge: 2017-12-29 | Disposition: A | Discharge status: Home / Self Care | Payer: Medicare Other | Attending: Emergency Medicine | Admitting: Emergency Medicine

## 2017-12-28 ENCOUNTER — Other Ambulatory Visit: Payer: Self-pay

## 2017-12-28 DIAGNOSIS — Z79899 Other long term (current) drug therapy: Secondary | ICD-10-CM | POA: Diagnosis not present

## 2017-12-28 DIAGNOSIS — R1032 Left lower quadrant pain: Secondary | ICD-10-CM | POA: Diagnosis present

## 2017-12-28 DIAGNOSIS — I10 Essential (primary) hypertension: Secondary | ICD-10-CM | POA: Diagnosis not present

## 2017-12-28 DIAGNOSIS — K432 Incisional hernia without obstruction or gangrene: Secondary | ICD-10-CM | POA: Diagnosis not present

## 2017-12-28 DIAGNOSIS — Z7984 Long term (current) use of oral hypoglycemic drugs: Secondary | ICD-10-CM | POA: Insufficient documentation

## 2017-12-28 DIAGNOSIS — E1140 Type 2 diabetes mellitus with diabetic neuropathy, unspecified: Secondary | ICD-10-CM | POA: Diagnosis not present

## 2017-12-28 DIAGNOSIS — S301XXA Contusion of abdominal wall, initial encounter: Secondary | ICD-10-CM | POA: Diagnosis not present

## 2017-12-28 DIAGNOSIS — R112 Nausea with vomiting, unspecified: Secondary | ICD-10-CM | POA: Diagnosis not present

## 2017-12-28 LAB — COMPREHENSIVE METABOLIC PANEL
ALBUMIN: 4 g/dL (ref 3.5–5.0)
ALK PHOS: 44 U/L (ref 38–126)
ALT: 31 U/L (ref 0–44)
ANION GAP: 10 (ref 5–15)
AST: 31 U/L (ref 15–41)
BUN: 20 mg/dL (ref 8–23)
CALCIUM: 9.4 mg/dL (ref 8.9–10.3)
CO2: 25 mmol/L (ref 22–32)
Chloride: 104 mmol/L (ref 98–111)
Creatinine, Ser: 0.91 mg/dL (ref 0.61–1.24)
GFR calc Af Amer: >60 mL/min (ref >60)
GLUCOSE: 138 mg/dL — AB (ref 70–99)
Potassium: 3.4 mmol/L — ABNORMAL LOW (ref 3.5–5.1)
Sodium: 139 mmol/L (ref 135–145)
Total Bilirubin: 0.6 mg/dL (ref 0.3–1.2)
Total Protein: 7.4 g/dL (ref 6.5–8.1)

## 2017-12-28 LAB — URINALYSIS, COMPLETE (UACMP) WITH MICROSCOPIC
Bacteria, UA: NONE SEEN
Bilirubin Urine: NEGATIVE
GLUCOSE, UA: NEGATIVE mg/dL
Hgb urine dipstick: NEGATIVE
Ketones, ur: NEGATIVE mg/dL
LEUKOCYTES UA: NEGATIVE
NITRITE: NEGATIVE
PH: 5 (ref 5.0–8.0)
PROTEIN: NEGATIVE mg/dL
Specific Gravity, Urine: 1.025 (ref 1.005–1.030)
Squamous Epithelial / LPF: NONE SEEN (ref 0–5)

## 2017-12-28 LAB — CBC
HCT: 46.3 % (ref 40.0–52.0)
HEMOGLOBIN: 16.9 g/dL (ref 13.0–18.0)
MCH: 31.5 pg (ref 26.0–34.0)
MCHC: 36.4 g/dL — AB (ref 32.0–36.0)
MCV: 86.5 fL (ref 80.0–100.0)
Platelets: 290 10*3/uL (ref 150–440)
RBC: 5.35 MIL/uL (ref 4.40–5.90)
RDW: 14.2 % (ref 11.5–14.5)
WBC: 10.1 10*3/uL (ref 3.8–10.6)

## 2017-12-28 LAB — LIPASE, BLOOD: Lipase: 100 U/L — ABNORMAL HIGH (ref 11–51)

## 2017-12-28 NOTE — ED Triage Notes (Signed)
Patient reports having left lower abdominal pain that radiates into left lower back for approximately 1 week.

## 2017-12-28 NOTE — ED Notes (Signed)
Pt taken to bathroom via wheelchair, urine specimen provided and sent to lab

## 2017-12-29 ENCOUNTER — Emergency Department: Payer: Medicare Other

## 2017-12-29 DIAGNOSIS — S301XXA Contusion of abdominal wall, initial encounter: Secondary | ICD-10-CM | POA: Diagnosis not present

## 2017-12-29 MED ORDER — MORPHINE SULFATE (PF) 10 MG/ML IV SOLN
10.0000 mg | Freq: Once | INTRAVENOUS | Status: AC
  Administered 2017-12-29: 10 mg via INTRAVENOUS
  Filled 2017-12-29: qty 1

## 2017-12-29 MED ORDER — IBUPROFEN 600 MG PO TABS: 600.0000 mg | ORAL_TABLET | Freq: Three times a day (TID) | ORAL | 0 refills | Status: DC | PRN

## 2017-12-29 MED ORDER — IOPAMIDOL (ISOVUE-300) INJECTION 61%
100.0000 mL | Freq: Once | INTRAVENOUS | Status: AC | PRN
  Administered 2017-12-29: 100 mL via INTRAVENOUS

## 2017-12-29 MED ORDER — ONDANSETRON 4 MG PO TBDP: 4.0000 mg | ORAL_TABLET | Freq: Three times a day (TID) | ORAL | 0 refills | Status: DC | PRN

## 2017-12-29 MED ORDER — ONDANSETRON HCL 4 MG/2ML IJ SOLN
4.0000 mg | Freq: Once | INTRAMUSCULAR | Status: AC
  Administered 2017-12-29: 4 mg via INTRAVENOUS
  Filled 2017-12-29: qty 2

## 2017-12-29 MED ORDER — OXYCODONE HCL 10 MG PO TABS: 10.0000 mg | ORAL_TABLET | Freq: Four times a day (QID) | ORAL | 0 refills | Status: DC | PRN

## 2017-12-29 MED ORDER — KETOROLAC TROMETHAMINE 30 MG/ML IJ SOLN: 15.0000 mg | Freq: Once | INTRAMUSCULAR | Status: DC

## 2017-12-29 NOTE — Discharge Instructions (Addendum)
Please take your pain medication as needed for severe symptoms and make an appointment to follow-up with your surgeon this week for reevaluation.  Return to the emergency department sooner for any concerns whatsoever such as fevers, chills worsening pain, if you cannot eat or drink, or for any other issues whatsoever.  It was a pleasure to take care of you today, and thank you for coming to our emergency department.  If you have any questions or concerns before leaving please ask the nurse to grab me and I'm more than happy to go through your aftercare instructions again.  If you were prescribed any opioid pain medication today such as Norco, Vicodin, Percocet, morphine, hydrocodone, or oxycodone please make sure you do not drive when you are taking this medication as it can alter your ability to drive safely.  If you have any concerns once you are home that you are not improving or are in fact getting worse before you can make it to your follow-up appointment, please do not hesitate to call 911 and come back for further evaluation.  Darel Hong, MD  Results for orders placed or performed during the hospital encounter of 12/28/17  Lipase, blood  Result Value Ref Range   Lipase 100 (H) 11 - 51 U/L  Comprehensive metabolic panel  Result Value Ref Range   Sodium 139 135 - 145 mmol/L   Potassium 3.4 (L) 3.5 - 5.1 mmol/L   Chloride 104 98 - 111 mmol/L   CO2 25 22 - 32 mmol/L   Glucose, Bld 138 (H) 70 - 99 mg/dL   BUN 20 8 - 23 mg/dL   Creatinine, Ser 0.91 0.61 - 1.24 mg/dL   Calcium 9.4 8.9 - 10.3 mg/dL   Total Protein 7.4 6.5 - 8.1 g/dL   Albumin 4.0 3.5 - 5.0 g/dL   AST 31 15 - 41 U/L   ALT 31 0 - 44 U/L   Alkaline Phosphatase 44 38 - 126 U/L   Total Bilirubin 0.6 0.3 - 1.2 mg/dL   GFR calc non Af Amer >60 >60 mL/min   GFR calc Af Amer >60 >60 mL/min   Anion gap 10 5 - 15  CBC  Result Value Ref Range   WBC 10.1 3.8 - 10.6 K/uL   RBC 5.35 4.40 - 5.90 MIL/uL   Hemoglobin 16.9 13.0  - 18.0 g/dL   HCT 46.3 40.0 - 52.0 %   MCV 86.5 80.0 - 100.0 fL   MCH 31.5 26.0 - 34.0 pg   MCHC 36.4 (H) 32.0 - 36.0 g/dL   RDW 14.2 11.5 - 14.5 %   Platelets 290 150 - 440 K/uL  Urinalysis, Complete w Microscopic  Result Value Ref Range   Color, Urine YELLOW (A) YELLOW   APPearance CLEAR (A) CLEAR   Specific Gravity, Urine 1.025 1.005 - 1.030   pH 5.0 5.0 - 8.0   Glucose, UA NEGATIVE NEGATIVE mg/dL   Hgb urine dipstick NEGATIVE NEGATIVE   Bilirubin Urine NEGATIVE NEGATIVE   Ketones, ur NEGATIVE NEGATIVE mg/dL   Protein, ur NEGATIVE NEGATIVE mg/dL   Nitrite NEGATIVE NEGATIVE   Leukocytes, UA NEGATIVE NEGATIVE   RBC / HPF 0-5 0 - 5 RBC/hpf   WBC, UA 0-5 0 - 5 WBC/hpf   Bacteria, UA NONE SEEN NONE SEEN   Squamous Epithelial / LPF NONE SEEN 0 - 5   Mucus PRESENT    Amorphous Crystal PRESENT    Ct Abdomen Pelvis W Contrast  Result Date: 12/29/2017 CLINICAL  DATA:  Left-sided abdominal pain. History of appendectomy 2 weeks ago. Pain and bruising. EXAM: CT ABDOMEN AND PELVIS WITH CONTRAST TECHNIQUE: Multidetector CT imaging of the abdomen and pelvis was performed using the standard protocol following bolus administration of intravenous contrast. CONTRAST:  128mL ISOVUE-300 IOPAMIDOL (ISOVUE-300) INJECTION 61% COMPARISON:  12/18/2017 FINDINGS: Lower chest: Heart size is normal. Scattered coronary arteriosclerosis is noted of the included left main, lad and left circumflex. Right basilar atelectasis is identified likely on the basis of chronic right hemidiaphragmatic elevation. Hepatobiliary: Hepatic steatosis. Cholecystectomy. No biliary dilatation or mass. No abnormal fluid collections. Pancreas: Normal Spleen: Normal Adrenals/Urinary Tract: Adrenal glands are unremarkable. Kidneys are normal, without renal calculi, focal lesion, or hydronephrosis. Bladder is unremarkable. Stomach/Bowel: Appendectomy clips are noted at the base of the cecum without complicating features. No abscess or free  air. The stomach, small intestine and colon are nonacute without evidence of obstruction. Scattered colonic diverticulosis is noted without acute diverticulitis. Vascular/Lymphatic: No adenopathy. Mild aortic atherosclerosis without aneurysm or dissection. Reproductive: Normal size prostate with peripheral zone calcifications. Other: Mild subcutaneous soft tissue induration in the left lower quadrant that may represent site of laparoscopic probe. Fat containing umbilical hernia is also noted with mild soft tissue induration also likely representing postop change. Minimal herniation of fat is seen involving the left lower oblique muscles and likely representing filling in of omental fat at site of laparoscopic defect. Musculoskeletal: Posterior lumbar fusion from L3 through L5 with interbody blocks noted. No acute or suspicious osseous abnormality. IMPRESSION: 1. Soft tissue induration of the left lower quadrant subcutaneous fat likely representing site of prior laparoscopic port. No abnormal fluid collection or abscess. Small focus of fat is seen herniating in presumably into the laparoscopic port defect since prior. 2. Similar soft tissue induration at the umbilicus with fat containing umbilical hernia. 3. Scattered colonic diverticulosis along the left colon without acute diverticulitis. 4. Appendectomy site is unremarkable. 5. Hepatic steatosis. Electronically Signed   By: Ashley Royalty M.D.   On: 12/29/2017 01:25   Ct Abdomen Pelvis W Contrast  Result Date: 12/18/2017 CLINICAL DATA:  Acute abdominal pain. Nausea and right-sided pain. Fever. EXAM: CT ABDOMEN AND PELVIS WITH CONTRAST TECHNIQUE: Multidetector CT imaging of the abdomen and pelvis was performed using the standard protocol following bolus administration of intravenous contrast. CONTRAST:  121mL OMNIPAQUE IOHEXOL 300 MG/ML  SOLN COMPARISON:  Multiple prior exams most recent CT 09/23/2017 FINDINGS: Lower chest: Mild right middle and lower lobe  scarring. No acute findings. No consolidation. Hepatobiliary: Diffusely decreased hepatic density consistent with steatosis. No discrete focal lesion. Clips in the gallbladder fossa postcholecystectomy. No biliary dilatation. Pancreas: No ductal dilatation or inflammation. Spleen: Normal in size without focal abnormality. Adrenals/Urinary Tract: Normal adrenal glands. No hydronephrosis or perinephric edema. Homogeneous renal enhancement with symmetric excretion on delayed phase imaging. Urinary bladder is physiologically distended without wall thickening. Stomach/Bowel: The appendix is dilated measuring 11 mm, fluid-filled with mild periappendiceal fat stranding. No perforation or abscess. Equivocal gastric wall thickening which appears similar to prior exam. No small bowel dilatation, inflammation or obstruction. Mild colonic diverticulosis without diverticulitis. Vascular/Lymphatic: Mild aortic atherosclerosis without aneurysm. Incidental circumaortic left renal vein. No enlarged abdominopelvic lymph nodes. Reproductive: Prostatic calcifications. Other: No free air, free fluid, or intra-abdominal fluid collection. Small fat containing umbilical hernia. Fat in the left inguinal canal. Musculoskeletal: Posterior L3-L5 fusion with interbody spacers. There are no acute or suspicious osseous abnormalities. IMPRESSION: 1. Uncomplicated acute appendicitis. 2. Chronic findings include mild hepatic steatosis,  colonic diverticulosis, and Aortic Atherosclerosis (ICD10-I70.0). Electronically Signed   By: Jeb Levering M.D.   On: 12/18/2017 02:44

## 2017-12-29 NOTE — ED Provider Notes (Signed)
[facility] Emergency Department Provider Note  ____________________________________________   First MD Initiated Contact with Patient 12/29/17 0018     (approximate)  I have reviewed the triage vital signs and the nursing notes.   HISTORY  Chief Complaint Abdominal Pain   HPI Howard Murphy is a 62 y.o. male self presents to the emergency department with roughly 1 week of left lower quadrant pain.  10 days ago he had a laparoscopic appendectomy performed and initially went home feeling improved however over the course of the past week his pain in his left lower quadrant has become progressively severe.  It is now constant throbbing aching sharp nonradiating in the left lower quadrant.  Some nausea but no vomiting.  He is having normal bowel movements.  No fevers or chills.  He does have an umbilical hernia that is scheduled for repair in the near future.  He has yet to follow-up with his surgeon Dr. Burt Knack following his surgery.  He normally takes 10 mg of oxycodone daily for chronic back pain every 6 hours which is only minimally helped his pain today.    Past Medical History:  Diagnosis Date  . 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  . Back ache 05/05/2013  . Diabetes mellitus without complication ([place])   . GERD (gastroesophageal reflux disease)   . Headache   . Hyperlipemia   . Hypertension   . Kidney stones   . Kidney stones   . Nephrolithiasis   . Shortness of breath dyspnea     Patient Active Problem List   Diagnosis Date Noted  . Other specified dorsopathies, sacral and sacrococcygeal region 12/26/2017  . Acute appendicitis 12/18/2017  . Acute postoperative pain 03/28/2017  . Medication monitoring encounter 03/07/2017  . 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 Spondylolisthesis at L3-L4 level 08/03/2016  . Chronic pain syndrome 07/09/2016  . Encounter for medication  monitoring 02/24/2016  . Cervical spondylosis 12/17/2015  . Cervical facet arthropathy 12/17/2015  . Chronic neck pain (Bilateral) (R>L) 12/17/2015  . Cervical facet syndrome (Bilateral) (R>L) 12/17/2015  . Chronic upper extremity pain (Bilateral) (R>L) 12/17/2015  . Cervical foraminal stenosis 12/17/2015  .  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 Arthropathy of shoulder (Right) 12/17/2015  . Osteoarthritis 12/17/2015  . 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 Lumbar foraminal stenosis (L4-5) (Left) 12/17/2015  . Grade 1 Anterolisthesis (2 mm) of L4 over L5. 12/17/2015  . Lumbar facet (L4-5) synovial cyst (6 mm) (Left) 12/17/2015  . Chronic sacroiliac joint pain (Bilateral) (L>R) 12/08/2015  . Neurogenic pain 12/08/2015  . Abnormal MRI, lumbar spine 11/21/2015  . Radicular pain of shoulder (Right) 10/03/2015  . Carpal tunnel syndrome (Bilateral) 10/03/2015  . Chronic cervical radicular pain (Right) 10/03/2015  . Chronic low back pain (Primary Source of Pain) (Bilateral) (L>R) 09/06/2015  . Opiate use (60 MME/Day) 09/05/2015  . Long term prescription opiate use 09/05/2015  . Long term current use of opiate analgesic 09/05/2015  . Spondylosis of lumbar spine 09/05/2015  . Lumbar facet joint syndrome ([place]) 09/05/2015  . Chronic hip pain (Left) 09/05/2015  . Diabetic peripheral neuropathy ([place]) 09/05/2015  . Chronic lumbar radicular pain (S1 Dermatome) (Bilateral) (L>R) 09/05/2015  . Hypokalemia 09/05/2015  . Chronic shoulder pain (Primary Source of Pain) (Bilateral) (L>R) 09/05/2015  . Diabetes ([place]) 09/05/2015  . Chronic foot pain (Secondary source of pain) (Bilateral) (L>R) 09/05/2015  .  Chronic hand pain [facility] source of pain) (Bilateral) (R>L) 09/05/2015  . Lumbar facet hypertrophy 09/05/2015  . Encounter for therapeutic drug level monitoring 09/05/2015  . Low testosterone 09/05/2015  . Occipital pain (Right) 09/05/2015  . 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 Hypertriglyceridemia 07/26/2015  . Chronic abdominal pain (RUQ) 07/14/2015  . Type 2 diabetes mellitus with peripheral neuropathy ([place]) 07/14/2015  . Chest pain 07/14/2015  . Complete tear of the shoulder rotator cuff (Left) 05/10/2015  . Complete tear of left rotator cuff 05/10/2015  . SOB (shortness of breath) 03/22/2015  . Elevated hemoglobin ([place]) 07/07/2013  . History of lumbar facet Synovial cyst, surgically removed. 05/29/2013    Class: History of  . 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 GERD (gastroesophageal reflux disease) 05/05/2013  . Benign prostatic hyperplasia 05/05/2013  . Hypertension 05/08/2011  . Hyperlipidemia 05/08/2011  . RA (rheumatoid arthritis) ([place]) 05/08/2011    Past Surgical History:  Procedure Laterality Date  . BACK SURGERY  2015   x3  . BACK SURGERY  08/09/2016  . CHOLECYSTECTOMY  2000  . COLONOSCOPY WITH PROPOFOL N/A 04/11/2015   Procedure: COLONOSCOPY WITH PROPOFOL;  Surgeon: Manya Silvas, MD;  Location: [facility] ENDOSCOPY;  Service: Endoscopy;  Laterality: N/A;  . CYSTOSCOPY WITH URETEROSCOPY AND STENT PLACEMENT Right 08/27/2017   Procedure: CYSTOSCOPY WITH URETEROSCOPY AND STENT PLACEMENT;  Surgeon: Abbie Sons, MD;  Location: [facility] ORS;  Service: Urology;  Laterality: Right;  . ESOPHAGOGASTRODUODENOSCOPY (EGD) WITH PROPOFOL N/A 04/11/2015   Procedure: ESOPHAGOGASTRODUODENOSCOPY (EGD) WITH PROPOFOL;  Surgeon: Manya Silvas, MD;  Location: [facility] ENDOSCOPY;  Service: Endoscopy;  Laterality: N/A;  . LAPAROSCOPIC APPENDECTOMY N/A 12/18/2017   Procedure: APPENDECTOMY LAPAROSCOPIC;  Surgeon: Florene Glen, MD;  Location: [facility] ORS;  Service: General;  Laterality: N/A;  . SHOULDER SURGERY Right 2016  . SHOULDER SURGERY Left 06/2015    Prior to Admission medications   Medication Sig Start Date End Date Taking? Authorizing Provider  acetaminophen (TYLENOL) 500 MG tablet Take 1,000 mg by mouth every 6  (six) hours as needed (for pain.).    [provider]  albuterol (PROVENTIL HFA;VENTOLIN HFA) 108 (90 Base) MCG/ACT inhaler Inhale 2 puffs into the lungs every 6 (six) hours as needed for wheezing or shortness of breath. 05/29/17   Fisher, Linden Dolin, PA-C  atorvastatin (LIPITOR) 40 MG tablet Take 1 tablet (40 mg total) by mouth at bedtime. 03/08/17   Arnetha Courser, MD  chlorthalidone (HYGROTON) 50 MG tablet Take 50 mg by mouth daily.    [provider]  gabapentin (NEURONTIN) 300 MG capsule Take 300 mg by mouth 3 (three) times daily.    [provider]  ibuprofen (ADVIL,MOTRIN) 600 MG tablet Take 1 tablet (600 mg total) by mouth every 8 (eight) hours as needed. 12/29/17   Darel Hong, MD  lisinopril (PRINIVIL,ZESTRIL) 20 MG tablet Take 1 tablet (20 mg total) by mouth daily. Reported on 12/08/2015 03/08/17   Arnetha Courser, MD  ondansetron (ZOFRAN ODT) 4 MG disintegrating tablet Take 1 tablet (4 mg total) by mouth every 8 (eight) hours as needed for nausea or vomiting. 12/29/17   Darel Hong, MD  Oxycodone HCl 10 MG TABS Take 1 tablet (10 mg total) by mouth every 6 (six) hours as needed (severe pain). 12/29/17   Darel Hong, MD  oxyCODONE-acetaminophen (PERCOCET/ROXICET) 5-325 MG tablet Take 1 tablet by mouth every 6 (six) hours as needed for moderate pain. 12/19/17   Florene Glen, MD  potassium chloride (K-DUR) 10 MEQ tablet Take 1 tablet (10 mEq total) by mouth daily. 08/23/17   Stoioff, Ronda Fairly, MD  pregabalin (LYRICA) 25 MG capsule Take 25 mg by mouth 2 (two) times daily.    [provider]  ranitidine (ZANTAC) 150 MG tablet Take 150 mg by mouth daily.    [provider]  sitaGLIPtin (JANUVIA) 50 MG tablet Take 1 tablet (50 mg total) by mouth daily. 03/08/17   Arnetha Courser, MD  tamsulosin (FLOMAX) 0.4 MG CAPS capsule Take 1 capsule (0.4 mg total) by mouth daily after supper. 12/06/17   Festus Aloe, MD  testosterone cypionate  (DEPOTESTOSTERONE CYPIONATE) 200 MG/ML injection INJECT 1 ML INTO THE MUSCLE Q 2 WEEKS. ROTATE SITE. 11/11/17   [provider]    Allergies Patient has no known allergies.  Family History  Problem Relation Age of Onset  . Cancer Mother        Breast CA  . Stroke Father   . Heart disease Father   . Cancer Sister   . Cancer Maternal Grandmother   . Cancer Sister   . Bladder Cancer Neg Hx   . Prostate cancer Neg Hx   . Kidney cancer Neg Hx     Social History Social History   Tobacco Use  . Smoking status: Never Smoker  . Smokeless tobacco: Former Systems developer    Types: Chew  Substance Use Topics  . Alcohol use: No    Frequency: Never  . Drug use: No    Review of Systems Constitutional: No fever/chills Eyes: No visual changes. ENT: No sore throat. Cardiovascular: Denies chest pain. Respiratory: Denies shortness of breath. Gastrointestinal: Positive for abdominal pain.  Positive for nausea, no vomiting.  No diarrhea.  No constipation. Genitourinary: Negative for dysuria. Musculoskeletal: Negative for back pain. Skin: Negative for rash. Neurological: Negative for headaches, focal weakness or numbness.   ____________________________________________   PHYSICAL EXAM:  VITAL SIGNS: ED Triage Vitals  Enc Vitals Group     BP 12/29/17 0002 (!) 143/94     Pulse Rate 12/29/17 0002 (!) 58     Resp --      Temp --      Temp src --      SpO2 12/29/17 0002 100 %     Weight --      Height --      Head Circumference --      Peak Flow --      Pain Score 12/28/17 2019 7     Pain Loc --      Pain Edu? --      Excl. in [place]? --     Constitutional: Alert and oriented x4 appears quite uncomfortable holding his abdomen Eyes: PERRL EOMI. Head: Atraumatic. Nose: No congestion/rhinnorhea. Mouth/Throat: No trismus Neck: No stridor.   Cardiovascular: Normal rate, regular rhythm. Grossly normal heart sounds.  Good peripheral circulation. Respiratory: Normal respiratory  effort.  No retractions. Lungs CTAB and moving good air Gastrointestinal: Soft somewhat tender in the left lower quadrant over a site of ecchymoses with no frank peritonitis Musculoskeletal: No lower extremity edema   Neurologic:  Normal speech and language. No gross focal neurologic deficits are appreciated. Skin:  Skin is warm, dry and intact. No rash noted. Psychiatric: Mood and affect are normal. Speech and behavior are normal.    ____________________________________________   DIFFERENTIAL includes but not limited to  Abdominal infection, abscess, retained surgical instrument, hernia, bowel obstruction ____________________________________________   LABS (all labs ordered  are listed, but only abnormal results are displayed)  Labs Reviewed  LIPASE, BLOOD - Abnormal; Notable for the following components:      Result Value   Lipase 100 (*)    All other components within normal limits  COMPREHENSIVE METABOLIC PANEL - Abnormal; Notable for the following components:   Potassium 3.4 (*)    Glucose, Bld 138 (*)    All other components within normal limits  CBC - Abnormal; Notable for the following components:   MCHC 36.4 (*)    All other components within normal limits  URINALYSIS, COMPLETE (UACMP) WITH MICROSCOPIC - Abnormal; Notable for the following components:   Color, Urine YELLOW (*)    APPearance CLEAR (*)    All other components within normal limits    Lab work reviewed by me with slightly increased lipase but not consistent with pancreatitis __________________________________________  EKG   ____________________________________________  RADIOLOGY  CT abdomen pelvis reviewed by me consistent with incisional hernia from previous port site on the left ____________________________________________   PROCEDURES  Procedure(s) performed: no  Procedures  Critical Care performed: no  ____________________________________________   INITIAL IMPRESSION / ASSESSMENT  AND PLAN / ED COURSE  Pertinent labs & imaging results that were available during my care of the patient were reviewed by me and considered in my medical decision making (see chart for details).   The patient arrives quite uncomfortable appearing with new abdominal pain in the immediate postoperative period.  He has significant pain requirements at baseline so have given him 10 mg of IV morphine and 4 mg of IV ondansetron and he will require a CT scan with IV contrast to further evaluate.     ----------------------------------------- 2:29 AM on 12/29/2017 -----------------------------------------  Spoke with Dr. Peyton Najjar on-call for Dr. Burt Knack who indicated that this does need to be followed up and possibly repaired however if the patient's pain can be controlled and he can eat and drink no indication for emergent surgery.  I then discussed with the patient while his pain is improved is not gone.  Given 15 mg of IV Toradol with significant improvement in his symptoms.  At this point I will give him a refill of his oxycodone as well as ibuprofen and help him establish follow-up with Dr. Burt Knack this week.  Strict return precautions have been given and the patient was able to eat and drink prior to discharge. ____________________________________________   FINAL CLINICAL IMPRESSION(S) / ED DIAGNOSES  Final diagnoses:  Incisional hernia, without obstruction or gangrene      NEW MEDICATIONS STARTED DURING THIS VISIT:  Discharge Medication List as of 12/29/2017  2:29 AM    START taking these medications   Details  ibuprofen (ADVIL,MOTRIN) 600 MG tablet Take 1 tablet (600 mg total) by mouth every 8 (eight) hours as needed., Starting Sun 12/29/2017, Print         Note:  This document was prepared using Dragon voice recognition software and may include unintentional dictation errors.     Darel Hong, MD 12/29/17 2231

## 2017-12-29 NOTE — ED Notes (Signed)
Pt reports appendectomy last Monday , pain @ [facility] , PAIN HAS NOT IMPROVED FROM TIME OF DISCHARGE . Pt reports last bm this A.M.

## 2017-12-30 ENCOUNTER — Ambulatory Visit (INDEPENDENT_AMBULATORY_CARE_PROVIDER_SITE_OTHER): Payer: Medicare Other | Admitting: Surgery

## 2017-12-30 ENCOUNTER — Encounter: Payer: Self-pay | Admitting: Surgery

## 2017-12-30 VITALS — BP 119/77 | HR 96 | Temp 97.7°F | Ht 67.0 in | Wt 189.4 lb

## 2017-12-30 DIAGNOSIS — Z09 Encounter for follow-up examination after completed treatment for conditions other than malignant neoplasm: Secondary | ICD-10-CM

## 2017-12-30 NOTE — Patient Instructions (Signed)

## 2017-12-30 NOTE — Progress Notes (Signed)
S/p lap appy by Dr. Burt Knack 7/10 Came to the ER yesterday and a CT personally reviewed, no evidence of abscess or issues with the stump. UH, some inflammatory response around Left port site. No evidence of true defect throughout the entire abd wall fascial layers   Objective:  Physical exam: NAD alert Abd: incisions healing well, no infection, no peritonitis, incisional pain on Left port site, no hernia or infection   Lab Results: CBC  Recent Labs    12/28/17 2022  WBC 10.1  HGB 16.9  HCT 46.3  PLT 290   BMET Recent Labs    12/28/17 2022  NA 139  K 3.4*  CL 104  CO2 25  GLUCOSE 138*  BUN 20  CREATININE 0.91  CALCIUM 9.4   PT/INR No results for input(s): LABPROT, INR in the last 72 hours. ABG No results for input(s): PHART, HCO3 in the last 72 hours.  Invalid input(s): PCO2, PO2  Studies/Results: Ct Abdomen Pelvis W Contrast  Result Date: 12/29/2017 CLINICAL DATA:  Left-sided abdominal pain. History of appendectomy 2 weeks ago. Pain and bruising. EXAM: CT ABDOMEN AND PELVIS WITH CONTRAST TECHNIQUE: Multidetector CT imaging of the abdomen and pelvis was performed using the standard protocol following bolus administration of intravenous contrast. CONTRAST:  170mL ISOVUE-300 IOPAMIDOL (ISOVUE-300) INJECTION 61% COMPARISON:  12/18/2017 FINDINGS: Lower chest: Heart size is normal. Scattered coronary arteriosclerosis is noted of the included left main, lad and left circumflex. Right basilar atelectasis is identified likely on the basis of chronic right hemidiaphragmatic elevation. Hepatobiliary: Hepatic steatosis. Cholecystectomy. No biliary dilatation or mass. No abnormal fluid collections. Pancreas: Normal Spleen: Normal Adrenals/Urinary Tract: Adrenal glands are unremarkable. Kidneys are normal, without renal calculi, focal lesion, or hydronephrosis. Bladder is unremarkable. Stomach/Bowel: Appendectomy clips are noted at the base of the cecum without complicating features. No  abscess or free air. The stomach, small intestine and colon are nonacute without evidence of obstruction. Scattered colonic diverticulosis is noted without acute diverticulitis. Vascular/Lymphatic: No adenopathy. Mild aortic atherosclerosis without aneurysm or dissection. Reproductive: Normal size prostate with peripheral zone calcifications. Other: Mild subcutaneous soft tissue induration in the left lower quadrant that may represent site of laparoscopic probe. Fat containing umbilical hernia is also noted with mild soft tissue induration also likely representing postop change. Minimal herniation of fat is seen involving the left lower oblique muscles and likely representing filling in of omental fat at site of laparoscopic defect. Musculoskeletal: Posterior lumbar fusion from L3 through L5 with interbody blocks noted. No acute or suspicious osseous abnormality. IMPRESSION: 1. Soft tissue induration of the left lower quadrant subcutaneous fat likely representing site of prior laparoscopic port. No abnormal fluid collection or abscess. Small focus of fat is seen herniating in presumably into the laparoscopic port defect since prior. 2. Similar soft tissue induration at the umbilicus with fat containing umbilical hernia. 3. Scattered colonic diverticulosis along the left colon without acute diverticulitis. 4. Appendectomy site is unremarkable. 5. Hepatic steatosis. Electronically Signed   By: Ashley Royalty M.D.   On: 12/29/2017 01:25    Anti-infectives: Anti-infectives (From admission, onward)   None      Assessment/Plan: 62 year old male w chronic back pain on chronic narcotics status post appendectomy by Dr. Burt Knack.  Pain seems to be the main issue and this is likely neuropathic pain from the left port incision.  There is no clinical evidence of actual incisional hernia or infection. I Discussed  with the patient in detail about eyes and anti-inflammatories. I  Did offer  him the option of gabapentin but  the patient does not wish to take additional medications. F/U Dr. Burt Knack next week I do think that his UH will need to be address in the future.  Caroleen Hamman, MD, [facility]  12/30/2017

## 2018-01-01 ENCOUNTER — Encounter: Payer: Self-pay | Admitting: Surgery

## 2018-01-06 ENCOUNTER — Encounter: Payer: Medicare Other | Admitting: Surgery

## 2018-01-14 ENCOUNTER — Encounter: Payer: Self-pay | Admitting: Pain Medicine

## 2018-01-14 ENCOUNTER — Ambulatory Visit
Admission: RE | Admit: 2018-01-14 | Discharge: 2018-01-14 | Disposition: A | Discharge status: Home / Self Care | Payer: Medicare Other | Source: Ambulatory Visit | Attending: Pain Medicine | Admitting: Pain Medicine

## 2018-01-14 ENCOUNTER — Other Ambulatory Visit: Payer: Self-pay

## 2018-01-14 ENCOUNTER — Telehealth: Payer: Self-pay | Admitting: Surgery

## 2018-01-14 ENCOUNTER — Ambulatory Visit (HOSPITAL_BASED_OUTPATIENT_CLINIC_OR_DEPARTMENT_OTHER): Payer: Medicare Other | Admitting: Pain Medicine

## 2018-01-14 VITALS — BP 144/97 | HR 78 | Temp 98.2°F | Resp 12 | Ht 67.0 in | Wt 190.0 lb

## 2018-01-14 DIAGNOSIS — Z9049 Acquired absence of other specified parts of digestive tract: Secondary | ICD-10-CM | POA: Insufficient documentation

## 2018-01-14 DIAGNOSIS — M5388 Other specified dorsopathies, sacral and sacrococcygeal region: Secondary | ICD-10-CM | POA: Diagnosis not present

## 2018-01-14 DIAGNOSIS — Z791 Long term (current) use of non-steroidal anti-inflammatories (NSAID): Secondary | ICD-10-CM | POA: Insufficient documentation

## 2018-01-14 DIAGNOSIS — M47816 Spondylosis without myelopathy or radiculopathy, lumbar region: Secondary | ICD-10-CM | POA: Diagnosis not present

## 2018-01-14 DIAGNOSIS — G8929 Other chronic pain: Secondary | ICD-10-CM | POA: Insufficient documentation

## 2018-01-14 DIAGNOSIS — M545 Low back pain: Secondary | ICD-10-CM

## 2018-01-14 DIAGNOSIS — G894 Chronic pain syndrome: Secondary | ICD-10-CM

## 2018-01-14 DIAGNOSIS — Z9889 Other specified postprocedural states: Secondary | ICD-10-CM | POA: Diagnosis not present

## 2018-01-14 DIAGNOSIS — Z79891 Long term (current) use of opiate analgesic: Secondary | ICD-10-CM | POA: Insufficient documentation

## 2018-01-14 DIAGNOSIS — M7138 Other bursal cyst, other site: Secondary | ICD-10-CM | POA: Diagnosis not present

## 2018-01-14 DIAGNOSIS — M533 Sacrococcygeal disorders, not elsewhere classified: Secondary | ICD-10-CM | POA: Insufficient documentation

## 2018-01-14 DIAGNOSIS — M47817 Spondylosis without myelopathy or radiculopathy, lumbosacral region: Secondary | ICD-10-CM | POA: Diagnosis not present

## 2018-01-14 DIAGNOSIS — Z79899 Other long term (current) drug therapy: Secondary | ICD-10-CM | POA: Insufficient documentation

## 2018-01-14 MED ORDER — TRIAMCINOLONE ACETONIDE 40 MG/ML IJ SUSP
INTRAMUSCULAR | Status: AC
  Filled 2018-01-14: qty 1

## 2018-01-14 MED ORDER — TRIAMCINOLONE ACETONIDE 40 MG/ML IJ SUSP
40.0000 mg | Freq: Once | INTRAMUSCULAR | Status: AC
  Administered 2018-01-14: 40 mg
  Filled 2018-01-14: qty 1

## 2018-01-14 MED ORDER — ROPIVACAINE HCL 2 MG/ML IJ SOLN
9.0000 mL | Freq: Once | INTRAMUSCULAR | Status: AC
  Administered 2018-01-14: 10 mL via PERINEURAL
  Filled 2018-01-14: qty 10

## 2018-01-14 MED ORDER — OXYCODONE HCL 10 MG PO TABS: 10.0000 mg | ORAL_TABLET | Freq: Four times a day (QID) | ORAL | 0 refills | Status: DC | PRN

## 2018-01-14 MED ORDER — LIDOCAINE HCL 2 % IJ SOLN
INTRAMUSCULAR | Status: AC
  Filled 2018-01-14: qty 20

## 2018-01-14 MED ORDER — LIDOCAINE HCL 2 % IJ SOLN
20.0000 mL | Freq: Once | INTRAMUSCULAR | Status: AC
  Administered 2018-01-14: 400 mg
  Filled 2018-01-14: qty 20

## 2018-01-14 MED ORDER — FENTANYL CITRATE (PF) 100 MCG/2ML IJ SOLN
25.0000 ug | INTRAMUSCULAR | Status: DC | PRN
  Administered 2018-01-14: 100 ug via INTRAVENOUS
  Filled 2018-01-14: qty 2

## 2018-01-14 MED ORDER — LACTATED RINGERS IV SOLN
1000.0000 mL | Freq: Once | INTRAVENOUS | Status: AC
  Administered 2018-01-14: 1000 mL via INTRAVENOUS

## 2018-01-14 MED ORDER — MIDAZOLAM HCL 5 MG/5ML IJ SOLN
1.0000 mg | INTRAMUSCULAR | Status: DC | PRN
  Administered 2018-01-14: 4 mg via INTRAVENOUS
  Filled 2018-01-14: qty 5

## 2018-01-14 NOTE — Progress Notes (Signed)
Patient's Name: Howard Murphy  MRN: [id number]  Referring Provider: [facility]  DOB: Oct 17, 1955  PCP: [facility] [place]  DOS: 01/14/2018  Note by: Gaspar Cola, MD  Service setting: Ambulatory outpatient  Specialty: Interventional Pain Management  Patient type: Established  Location: [facility] (AMB) Pain Management Facility  Visit type: Interventional Procedure   Primary Reason for Visit: Interventional Pain Management Treatment. CC: Back Pain (left, lower)  Procedure:          Anesthesia, Analgesia, Anxiolysis:  Type: Thermal Lumbar Facet, Medial Branch & Sacroiliac joint Radiofrequency Ablation Region: Lumbosacral Level: L2, L3, L4, L5, S1, & S2 Medial Branch Level(s). These levels will denervate the L3-4, L4-5, and the L5-S1 lumbar facet joints, as well as the posterior Sacroiliac Joint innervation. Primary Purpose: Therapeutic Region: Posterolateral Lumbosacral Spine Laterality: Left  Type: Moderate (Conscious) Sedation combined with Local Anesthesia Indication(s): Analgesia and Anxiety Route: Intravenous (IV) IV Access: Secured Sedation: Meaningful verbal contact was maintained at all times during the procedure  Local Anesthetic: Lidocaine 1-2%   Indications: 1. Spondylosis without myelopathy or radiculopathy, lumbosacral region   2. Lumbar facet joint syndrome ([place])   3. Lumbar facet hypertrophy   4. Other specified dorsopathies, sacral and sacrococcygeal region   5. Chronic sacroiliac joint pain (Bilateral) (L>R)   6. Lumbar facet (L4-5) synovial cyst (6 mm) (Left)   7. Chronic low back pain (Primary Source of Pain) (Bilateral) (L>R)    Howard Murphy has been dealing with the above chronic pain for longer than three months and has either failed to respond, was unable to tolerate, or simply did not get enough benefit from other more conservative therapies including, but not limited to: 1. Over-the-counter medications 2. Anti-inflammatory medications 3.  Muscle relaxants 4. Membrane stabilizers 5. Opioids 6. Physical therapy 7. Modalities (Heat, ice, etc.) 8. Invasive techniques such as nerve blocks. Howard Murphy has attained more than 50% relief of the pain from a series of diagnostic injections conducted in separate occasions.  Pain Score: Pre-procedure: 10-Worst pain ever/10 Post-procedure: 4 /10  Pre-op Assessment:  Howard Murphy is a 62 y.o. (year old), male patient, seen today for interventional treatment. He  has a past surgical history that includes Cholecystectomy (2000); Shoulder surgery (Right, 2016); Colonoscopy with propofol (N/A, 04/11/2015); Esophagogastroduodenoscopy (egd) with propofol (N/A, 04/11/2015); Shoulder surgery (Left, 06/2015); Back surgery (2015); Back surgery (08/09/2016); Cystoscopy with ureteroscopy and stent placement (Right, 08/27/2017); and laparoscopic appendectomy (N/A, 12/18/2017). Howard Murphy has a current medication list which includes the following prescription(s): acetaminophen, albuterol, atorvastatin, chlorthalidone, gabapentin, ibuprofen, lisinopril, ondansetron, oxycodone hcl, potassium chloride, pregabalin, ranitidine, sitagliptin, tamsulosin, testosterone cypionate, oxycodone hcl, oxycodone hcl, and oxycodone hcl, and the following Facility-Administered Medications: fentanyl and midazolam. His primarily concern today is the Back Pain (left, lower)  Initial Vital Signs:  Pulse/HCG Rate: 78ECG Heart Rate: 69 Temp: 98.2 F (36.8 C) Resp: 16 BP: (!) 141/90 SpO2: 96 %  BMI: Estimated body mass index is 29.76 kg/m as calculated from the following:   Height as of this encounter: 5\' 7"  (1.702 m).   Weight as of this encounter: 190 lb (86.2 kg).  Risk Assessment: Allergies: Reviewed. He has No Known Allergies.  Allergy Precautions: None required Coagulopathies: Reviewed. None identified.  Blood-thinner therapy: None at this time Active Infection(s): Reviewed. None identified. Howard Murphy is  afebrile  Site Confirmation: Howard Murphy was asked to confirm the procedure and laterality before marking the site Procedure checklist: Completed Consent: Before the procedure and under the influence  of no sedative(s), amnesic(s), or anxiolytics, the patient was informed of the treatment options, risks and possible complications. To fulfill our ethical and legal obligations, as recommended by the American Medical Association's Code of Ethics, I have informed the patient of my clinical impression; the nature and purpose of the treatment or procedure; the risks, benefits, and possible complications of the intervention; the alternatives, including doing nothing; the risk(s) and benefit(s) of the alternative treatment(s) or procedure(s); and the risk(s) and benefit(s) of doing nothing. The patient was provided information about the general risks and possible complications associated with the procedure. These may include, but are not limited to: failure to achieve desired goals, infection, bleeding, organ or nerve damage, allergic reactions, paralysis, and death. In addition, the patient was informed of those risks and complications associated to Spine-related procedures, such as failure to decrease pain; infection (i.e.: Meningitis, epidural or intraspinal abscess); bleeding (i.e.: epidural hematoma, subarachnoid hemorrhage, or any other type of intraspinal or peri-dural bleeding); organ or nerve damage (i.e.: Any type of peripheral nerve, nerve root, or spinal cord injury) with subsequent damage to sensory, motor, and/or autonomic systems, resulting in permanent pain, numbness, and/or weakness of one or several areas of the body; allergic reactions; (i.e.: anaphylactic reaction); and/or death. Furthermore, the patient was informed of those risks and complications associated with the medications. These include, but are not limited to: allergic reactions (i.e.: anaphylactic or anaphylactoid reaction(s)); adrenal  axis suppression; blood sugar elevation that in diabetics may result in ketoacidosis or comma; water retention that in patients with history of congestive heart failure may result in shortness of breath, pulmonary edema, and decompensation with resultant heart failure; weight gain; swelling or edema; medication-induced neural toxicity; particulate matter embolism and blood vessel occlusion with resultant organ, and/or nervous system infarction; and/or aseptic necrosis of one or more joints. Finally, the patient was informed that Medicine is not an exact science; therefore, there is also the possibility of unforeseen or unpredictable risks and/or possible complications that may result in a catastrophic outcome. The patient indicated having understood very clearly. We have given the patient no guarantees and we have made no promises. Enough time was given to the patient to ask questions, all of which were answered to the patient's satisfaction. Howard Murphy has indicated that he wanted to continue with the procedure. Attestation: I, the ordering provider, attest that I have discussed with the patient the benefits, risks, side-effects, alternatives, likelihood of achieving goals, and potential problems during recovery for the procedure that I have provided informed consent. Date  Time: 01/14/2018  1:32 PM  Pre-Procedure Preparation:  Monitoring: As per clinic protocol. Respiration, ETCO2, SpO2, BP, heart rate and rhythm monitor placed and checked for adequate function Safety Precautions: Patient was assessed for positional comfort and pressure points before starting the procedure. Time-out: I initiated and conducted the "Time-out" before starting the procedure, as per protocol. The patient was asked to participate by confirming the accuracy of the "Time Out" information. Verification of the correct person, site, and procedure were performed and confirmed by me, the nursing staff, and the patient. "Time-out"  conducted as per Joint Commission's Universal Protocol (UP.01.01.01). Time: 1542  Description of Procedure:          Position: Prone Laterality: Left Level: L2, L3, L4, L5, S1, S2, & S3 Medial Branch Level(s), at the L3-4, L4-5, and the L5-S1 lumbar facet joints, as well as the posterior Sacroiliac Joint. Area Prepped: Lumbosacral Prepping solution: ChloraPrep (2% chlorhexidine gluconate and 70% isopropyl alcohol)  Safety Precautions: Aspiration looking for blood return was conducted prior to all injections. At no point did we inject any substances, as a needle was being advanced. Before injecting, the patient was told to immediately notify me if he was experiencing any new onset of "ringing in the ears, or metallic taste in the mouth". No attempts were made at seeking any paresthesias. Safe injection practices and needle disposal techniques used. Medications properly checked for expiration dates. SDV (single dose vial) medications used. After the completion of the procedure, all disposable equipment used was discarded in the proper designated medical waste containers. Local Anesthesia: Protocol guidelines were followed. The patient was positioned over the fluoroscopy table. The area was prepped in the usual manner. The time-out was completed. The target area was identified using fluoroscopy. A 12-in long, straight, sterile hemostat was used with fluoroscopic guidance to locate the targets for each level blocked. Once located, the skin was marked with an approved surgical skin marker. Once all sites were marked, the skin (epidermis, dermis, and hypodermis), as well as deeper tissues (fat, connective tissue and muscle) were infiltrated with a small amount of a short-acting local anesthetic, loaded on a 10cc syringe with a 25G, 1.5-in  Needle. An appropriate amount of time was allowed for local anesthetics to take effect before proceeding to the next step. Local Anesthetic: Lidocaine 2.0% The unused portion  of the local anesthetic was discarded in the proper designated containers. Technical explanation of process:  Radiofrequency Ablation (RFA) L2 Medial Branch Nerve RFA: The target area for the L2 medial branch is at the junction of the postero-lateral aspect of the superior articular process and the superior, posterior, and medial edge of the transverse process of L3. Under fluoroscopic guidance, a Radiofrequency needle was inserted until contact was made with os over the superior postero-lateral aspect of the pedicular shadow (target area). Sensory and motor testing was conducted to properly adjust the position of the needle. Once satisfactory placement of the needle was achieved, the numbing solution was slowly injected after negative aspiration for blood. 2.0 mL of the nerve block solution was injected without difficulty or complication. After waiting for at least 3 minutes, the ablation was performed. Once completed, the needle was removed intact. L3 Medial Branch Nerve RFA: The target area for the L3 medial branch is at the junction of the postero-lateral aspect of the superior articular process and the superior, posterior, and medial edge of the transverse process of L4. Under fluoroscopic guidance, a Radiofrequency needle was inserted until contact was made with os over the superior postero-lateral aspect of the pedicular shadow (target area). Sensory and motor testing was conducted to properly adjust the position of the needle. Once satisfactory placement of the needle was achieved, the numbing solution was slowly injected after negative aspiration for blood. 2.0 mL of the nerve block solution was injected without difficulty or complication. After waiting for at least 3 minutes, the ablation was performed. Once completed, the needle was removed intact. L4 Medial Branch Nerve RFA: The target area for the L4 medial branch is at the junction of the postero-lateral aspect of the superior articular process  and the superior, posterior, and medial edge of the transverse process of L5. Under fluoroscopic guidance, a Radiofrequency needle was inserted until contact was made with os over the superior postero-lateral aspect of the pedicular shadow (target area). Sensory and motor testing was conducted to properly adjust the position of the needle. Once satisfactory placement of the needle was achieved, the numbing  solution was slowly injected after negative aspiration for blood. 2.0 mL of the nerve block solution was injected without difficulty or complication. After waiting for at least 3 minutes, the ablation was performed. Once completed, the needle was removed intact. L5 Medial Branch Nerve RFA: The target area for the L5 medial branch is at the junction of the postero-lateral aspect of the superior articular process of S1 and the superior, posterior, and medial edge of the sacral ala. Under fluoroscopic guidance, a Radiofrequency needle was inserted until contact was made with os over the superior postero-lateral aspect of the pedicular shadow (target area). Sensory and motor testing was conducted to properly adjust the position of the needle. Once satisfactory placement of the needle was achieved, the numbing solution was slowly injected after negative aspiration for blood. 2.0 mL of the nerve block solution was injected without difficulty or complication. After waiting for at least 3 minutes, the ablation was performed. Once completed, the needle was removed intact. S1 Primary Dorsal Rami and Lateral Branch Nerve RFA: The target area for the S1 medial branch is located inferior to the junction of the S1 superior articular process and the L5 inferior articular process, posterior, inferior, and lateral to the 6 o'clock position of the L5-S1 facet joint, just superior to the S1 posterior foramen. Under fluoroscopic guidance, the Radiofrequency needle was advanced until contact was made with os over the Target area.  Sensory and motor testing was conducted to properly adjust the position of the needle. Once satisfactory placement of the needle was achieved, the numbing solution was slowly injected after negative aspiration for blood. 2.0 mL of the nerve block solution was injected without difficulty or complication. After waiting for at least 3 minutes, the ablation was performed. Once completed, the needle was removed intact. S2 Primary Dorsal Rami and Lateral Branch Nerve RFA: The target area for the S2 medial branch is at the posterior superior lateral of the S2 posterior neural foramen. Under fluoroscopic guidance, the Radiofrequency needle was advanced until contact was made with os over the Target area. Sensory and motor testing was conducted to properly adjust the position of the needle. Once satisfactory placement of the needle was achieved, the numbing solution was slowly injected after negative aspiration for blood. 2.0 mL of the nerve block solution was injected without difficulty or complication. After waiting for at least 3 minutes, the ablation was performed. Once completed, the needle was removed intact.  Radiofrequency lesioning (ablation):  Radiofrequency Generator: NeuroTherm [id number] Sensory Stimulation Parameters: 50 Hz was used to locate & identify the nerve, making sure that the needle was positioned such that there was no sensory stimulation below 0.3 V or above 0.7 V. Motor Stimulation Parameters: 2 Hz was used to evaluate the motor component. Care was taken not to lesion any nerves that demonstrated motor stimulation of the lower extremities at an output of less than 2.5 times that of the sensory threshold, or a maximum of 2.0 V. Lesioning Technique Parameters: Standard Radiofrequency settings. (Not bipolar or pulsed.) Temperature Settings: 80 degrees C Lesioning time: 60 seconds Intra-operative Compliance: Compliant Materials & Medications: Needle(s) (Electrode/Cannula) Type: Teflon-coated,  curved tip, Radiofrequency needle(s) Gauge: 22G Length: 10cm Numbing solution: 0.2% PF-Ropivacaine + Triamcinolone (40 mg/mL) diluted to a final concentration of 4 mg of Triamcinolone/mL of Ropivacaine The unused portion of the solution was discarded in the proper designated containers.  Once the entire procedure was completed, the treated area was cleaned, making sure to leave some of the prepping solution  back to take advantage of its long term bactericidal properties.    Illustration of the posterior view of the lumbar spine and the posterior neural structures. Laminae of L2 through S1 are labeled. DPRL5, dorsal primary ramus of L5; DPRS1, dorsal primary ramus of S1; DPR3, dorsal primary ramus of L3; FJ, facet (zygapophyseal) joint L3-L4; I, inferior articular process of L4; LB1, lateral branch of dorsal primary ramus of L1; IAB, inferior articular branches from L3 medial branch (supplies L4-L5 facet joint); IBP, intermediate branch plexus; MB3, medial branch of dorsal primary ramus of L3; NR3, third lumbar nerve root; S, superior articular process of L5; SAB, superior articular branches from L4 (supplies L4-5 facet joint also); TP3, transverse process of L3.  Vitals:   01/14/18 1622 01/14/18 1632 01/14/18 1642 01/14/18 1650  BP: 134/80 135/83 117/90 (!) 144/97  Pulse:      Resp: 14 16 12 12   Temp:      TempSrc:      SpO2: 98% 95% 97% 98%  Weight:      Height:        Start Time: 1542 hrs. End Time: 1619 hrs.  Imaging Guidance (Spinal):          Type of Imaging Technique: Fluoroscopy Guidance (Spinal) Indication(s): Assistance in needle guidance and placement for procedures requiring needle placement in or near specific anatomical locations not easily accessible without such assistance. Exposure Time: Please see nurses notes. Contrast: None used. Fluoroscopic Guidance: I was personally present during the use of fluoroscopy. "Tunnel Vision Technique" used to obtain the best possible  view of the target area. Parallax error corrected before commencing the procedure. "Direction-depth-direction" technique used to introduce the needle under continuous pulsed fluoroscopy. Once target was reached, antero-posterior, oblique, and lateral fluoroscopic projection used confirm needle placement in all planes. Images permanently stored in EMR. Interpretation: No contrast injected. I personally interpreted the imaging intraoperatively. Adequate needle placement confirmed in multiple planes. Permanent images saved into the patient's record.  Antibiotic Prophylaxis:   Anti-infectives (From admission, onward)   None     Indication(s): None identified  Post-operative Assessment:  Post-procedure Vital Signs:  Pulse/HCG Rate: 7860 Temp: 98.2 F (36.8 C) Resp: 12 BP: (!) 144/97 SpO2: 98 %  EBL: None  Complications: No immediate post-treatment complications observed by team, or reported by patient.  Note: The patient tolerated the entire procedure well. A repeat set of vitals were taken after the procedure and the patient was kept under observation following institutional policy, for this type of procedure. Post-procedural neurological assessment was performed, showing return to baseline, prior to discharge. The patient was provided with post-procedure discharge instructions, including a section on how to identify potential problems. Should any problems arise concerning this procedure, the patient was given instructions to immediately contact us, at any time, without hesitation. In any case, we plan to contact the patient by telephone for a follow-up status report regarding this interventional procedure.  Comments:  No additional relevant information.  Plan of Care    Imaging Orders     DG C-Arm 1-60 Min-No Report  Procedure Orders     Radiofrequency,Lumbar     Radiofrequency Sacroiliac Joint  Medications ordered for procedure: Meds ordered this encounter  Medications  .  lidocaine (XYLOCAINE) 2 % (with pres) injection 400 mg  . midazolam (VERSED) 5 MG/5ML injection 1-2 mg    Make sure Flumazenil is available in the pyxis when using this medication. If oversedation occurs, administer 0.2 mg IV over 15 sec. If  after 45 sec no response, administer 0.2 mg again over 1 min; may repeat at 1 min intervals; not to exceed 4 doses (1 mg)  . fentaNYL (SUBLIMAZE) injection 25-50 mcg    Make sure Narcan is available in the pyxis when using this medication. In the event of respiratory depression (RR< 8/min): Titrate NARCAN (naloxone) in increments of 0.1 to 0.2 mg IV at 2-3 minute intervals, until desired degree of reversal.  . lactated ringers infusion 1,000 mL  . ropivacaine (PF) 2 mg/mL (0.2%) (NAROPIN) injection 9 mL  . triamcinolone acetonide (KENALOG-40) injection 40 mg  . ropivacaine (PF) 2 mg/mL (0.2%) (NAROPIN) injection 9 mL  . triamcinolone acetonide (KENALOG-40) injection 40 mg  . Oxycodone HCl 10 MG TABS    Sig: Take 1 tablet (10 mg total) by mouth every 6 (six) hours as needed.    Dispense:  120 tablet    Refill:  0    Do not place this medication, or any other prescription from our practice, on "Automatic Refill". Patient may have prescription filled one day early if pharmacy is closed on scheduled refill date. Do not fill until: 03/30/18 To last until: 04/29/18  . Oxycodone HCl 10 MG TABS    Sig: Take 1 tablet (10 mg total) by mouth every 6 (six) hours as needed.    Dispense:  120 tablet    Refill:  0    Do not place this medication, or any other prescription from our practice, on "Automatic Refill". Patient may have prescription filled one day early if pharmacy is closed on scheduled refill date. Do not fill until: 02/28/18 To last until: 03/30/18  . Oxycodone HCl 10 MG TABS    Sig: Take 1 tablet (10 mg total) by mouth every 6 (six) hours as needed.    Dispense:  120 tablet    Refill:  0    Do not place this medication, or any other prescription from  our practice, on "Automatic Refill". Patient may have prescription filled one day early if pharmacy is closed on scheduled refill date. Do not fill until: 01/29/18 To last until: 02/28/18   Medications administered: We administered lidocaine, midazolam, fentaNYL, lactated ringers, ropivacaine (PF) 2 mg/mL (0.2%), triamcinolone acetonide, ropivacaine (PF) 2 mg/mL (0.2%), and triamcinolone acetonide.  See the medical record for exact dosing, route, and time of administration.  New Prescriptions   No medications on file   Disposition: Discharge home  Discharge Date & Time: 01/14/2018; 1655 hrs.   Physician-requested Follow-up: Return for Post-RFA eval (6 wks), w/ Dionisio Westley, NP.  Future Appointments  Date Time Provider [place]  01/28/2018  9:15 AM Vevelyn Francois, NP [facility] None  02/25/2018  9:30 AM Vevelyn Francois, NP [facility] None   Primary Care Physician: [facility] [place] Location: [facility] Facility Note by: Gaspar Cola, MD Date: 01/14/2018; Time: 4:59 PM  Disclaimer:  Medicine is not an Chief Strategy Officer. The only guarantee in medicine is that nothing is guaranteed. It is important to note that the decision to proceed with this intervention was based on the information collected from the patient. The Data and conclusions were drawn from the patient's questionnaire, the interview, and the physical examination. Because the information was provided in large part by the patient, it cannot be guaranteed that it has not been purposely or unconsciously manipulated. Every effort has been made to obtain as much relevant data as possible for this evaluation. It is important to note that the  conclusions that lead to this procedure are derived in large part from the available data. Always take into account that the treatment will also be dependent on availability of resources and existing treatment guidelines, considered by other Pain Management  Practitioners as being common knowledge and practice, at the time of the intervention. For Medico-Legal purposes, it is also important to point out that variation in procedural techniques and pharmacological choices are the acceptable norm. The indications, contraindications, technique, and results of the above procedure should only be interpreted and judged by a Board-Certified Interventional Pain Specialist with extensive familiarity and expertise in the same exact procedure and technique.

## 2018-01-14 NOTE — Telephone Encounter (Signed)
Left a message for the patient to call the office patient no showed appointment back on 01/06/18 with Dr. Burt Knack. Please r/s if patient calls back.

## 2018-01-14 NOTE — Patient Instructions (Addendum)
___________________________________________________________________________________________  Post-Radiofrequency (RF) Discharge Instructions  You have just completed a Radiofrequency Neurotomy.  The following instructions will provide you with information and guidelines for self-care upon discharge.  If at any time you have questions or concerns please call your physician. DO NOT DRIVE YOURSELF!!  Instructions:  Apply ice: Fill a plastic sandwich bag with crushed ice. Cover it with a small towel and apply to injection site. Apply for 15 minutes then remove x 15 minutes. Repeat sequence on day of procedure, until you go to bed. The purpose is to minimize swelling and discomfort after procedure.  Apply heat: Apply heat to procedure site starting the day following the procedure. The purpose is to treat any soreness and discomfort from the procedure.  Food intake: No eating limitations, unless stipulated above.  Nevertheless, if you have had sedation, you may experience some nausea.  In this case, it may be wise to wait at least two hours prior to resuming regular diet.  Physical activities: Keep activities to a minimum for the first 8 hours after the procedure. For the first 24 hours after the procedure, do not drive a motor vehicle,  Operate heavy machinery, power tools, or handle any weapons.  Consider walking with the use of an assistive device or accompanied by an adult for the first 24 hours.  Do not drink alcoholic beverages including beer.  Do not make any important decisions or sign any legal documents. Go home and rest today.  Resume activities tomorrow, as tolerated.  Use caution in moving about as you may experience mild leg weakness.  Use caution in cooking, use of household electrical appliances and climbing steps.  Driving: If you have received any sedation, you are not allowed to drive for 24 hours after your procedure.  Blood thinner: Restart your blood thinner 6 hours after your  procedure. (Only for those taking blood thinners)  Insulin: As soon as you can eat, you may resume your normal dosing schedule. (Only for those taking insulin)  Medications: May resume pre-procedure medications.  Do not take any drugs, other than what has been prescribed to you.  Infection prevention: Keep procedure site clean and dry.  Post-procedure Pain Diary: Extremely important that this be done correctly and accurately. Recorded information will be used to determine the next step in treatment.  Pain evaluated is that of treated area only. Do not include pain from an untreated area.  Complete every hour, on the hour, for the initial 8 hours. Set an alarm to help you do this part accurately.  Do not go to sleep and have it completed later. It will not be accurate.  Follow-up appointment: Keep your follow-up appointment after the procedure. Usually 2 weeks for most procedures. (6 weeks in the case of radiofrequency.) Bring you pain diary.   Expect:  From numbing medicine (AKA: Local Anesthetics): Numbness or decrease in pain.  Onset: Full effect within 15 minutes of injected.  Duration: It will depend on the type of local anesthetic used. On the average, 1 to 8 hours.   From steroids: Decrease in swelling or inflammation. Once inflammation is improved, relief of the pain will follow.  Onset of benefits: Depends on the amount of swelling present. The more swelling, the longer it will take for the benefits to be seen. In some cases, up to 10 days.  Duration: Steroids will stay in the system x 2 weeks. Duration of benefits will depend on multiple posibilities including persistent irritating factors.  From procedure: Some   discomfort is to be expected once the numbing medicine wears off. This should be minimal if ice and heat are applied as instructed.  Call if:  You experience numbness and weakness that gets worse with time, as opposed to wearing off.  He experience any unusual  bleeding, difficulty breathing, or loss of the ability to control your bowel and bladder. (This applies to Spinal procedures only)  You experience any redness, swelling, heat, red streaks, elevated temperature, fever, or any other signs of a possible infection.  Emergency Numbers:  [facility] hours (Monday - Thursday, 8:00 AM - 4:00 PM) (Friday, 9:00 AM - 12:00 Noon): [phone]  After hours: [phone] ____________________________________________________________________________________________  Pain Management Discharge Instructions  General Discharge Instructions :  If you need to reach your doctor call: Monday-Friday 8:00 am - 4:00 pm at [phone] or toll free [phone].  After clinic hours [phone] to have operator reach doctor.  Bring all of your medication bottles to all your appointments in the pain clinic.  To cancel or reschedule your appointment with Pain Management please remember to call 24 hours in advance to avoid a fee.  Refer to the educational materials which you have been given on: General Risks, I had my Procedure. Discharge Instructions, Post Sedation.  Post Procedure Instructions:  The drugs you were given will stay in your system until tomorrow, so for the next 24 hours you should not drive, make any legal decisions or drink any alcoholic beverages.  You may eat anything you prefer, but it is better to start with liquids then soups and crackers, and gradually work up to solid foods.  Please notify your doctor immediately if you have any unusual bleeding, trouble breathing or pain that is not related to your normal pain.  Depending on the type of procedure that was done, some parts of your body may feel week and/or numb.  This usually clears up by tonight or the next day.  Walk with the use of an assistive device or accompanied by an adult for the 24 hours.  You may use ice on the affected area for the first 24 hours.  Put ice in a  Ziploc bag and cover with a towel and place against area 15 minutes on 15 minutes off.  You may switch to heat after 24 hours.Radiofrequency Lesioning Radiofrequency lesioning is a procedure that is performed to relieve pain. The procedure is often used for back, neck, or arm pain. Radiofrequency lesioning involves the use of a machine that creates radio waves to make heat. During the procedure, the heat is applied to the nerve that carries the pain signal. The heat damages the nerve and interferes with the pain signal. Pain relief usually starts about 2 weeks after the procedure and lasts for 6 months to 1 year. Tell a health care provider about:  Any allergies you have.  All medicines you are taking, including vitamins, herbs, eye drops, creams, and over-the-counter medicines.  Any problems you or family members have had with anesthetic medicines.  Any blood disorders you have.  Any surgeries you have had.  Any medical conditions you have.  Whether you are pregnant or may be pregnant. What are the risks? Generally, this is a safe procedure. However, problems may occur, including:  Pain or soreness at the injection site.  Infection at the injection site.  Damage to nerves or blood vessels.  What happens before the procedure?  Ask your health care provider about: ? Changing or stopping your regular  medicines. This is especially important if you are taking diabetes medicines or blood thinners. ? Taking medicines such as aspirin and ibuprofen. These medicines can thin your blood. Do not take these medicines before your procedure if your health care provider instructs you not to.  Follow instructions from your health care provider about eating or drinking restrictions.  Plan to have someone take you home after the procedure.  If you go home right after the procedure, plan to have someone with you for 24 hours. What happens during the procedure?  You will be given one or more of  the following: ? A medicine to help you relax (sedative). ? A medicine to numb the area (local anesthetic).  You will be awake during the procedure. You will need to be able to talk with the health care provider during the procedure.  With the help of a type of X-ray (fluoroscopy), the health care provider will insert a radiofrequency needle into the area to be treated.  Next, a wire that carries the radio waves (electrode) will be put through the radiofrequency needle. An electrical pulse will be sent through the electrode to verify the correct nerve. You will feel a tingling sensation, and you may have muscle twitching.  Then, the tissue that is around the needle tip will be heated by an electric current that is passed using the radiofrequency machine. This will numb the nerves.  A bandage (dressing) will be put on the insertion area after the procedure is done. The procedure may vary among health care providers and hospitals. What happens after the procedure?  Your blood pressure, heart rate, breathing rate, and blood oxygen level will be monitored often until the medicines you were given have worn off.  Return to your normal activities as directed by your health care provider. This information is not intended to replace advice given to you by your health care provider. Make sure you discuss any questions you have with your health care provider. Document Released: 01/24/2011 Document Revised: 11/03/2015 Document Reviewed: 07/05/2014 [employer] Interactive Patient Education  [employer].

## 2018-01-14 NOTE — Progress Notes (Signed)
Safety precautions to be maintained throughout the outpatient stay will include: orient to surroundings, keep bed in low position, maintain call bell within reach at all times, provide assistance with transfer out of bed and ambulation.  

## 2018-01-15 ENCOUNTER — Telehealth: Payer: Self-pay | Admitting: *Deleted

## 2018-01-15 NOTE — Telephone Encounter (Signed)
Denies complications post procedure. 

## 2018-01-28 ENCOUNTER — Encounter: Payer: Medicare Other | Admitting: Nurse Practitioner

## 2018-01-29 NOTE — Telephone Encounter (Signed)
Patients coming in on 02/11/18.

## 2018-02-04 NOTE — Progress Notes (Signed)
Patient's Name: Howard Murphy  MRN: [id number]  Referring Provider: [facility]  DOB: [birth year]  PCP: [facility]  DOS: 02/05/2018  Note by: Gaspar Cola, MD  Service setting: Ambulatory outpatient  Specialty: Interventional Pain Management  Location: [facility] (AMB) Pain Management Facility    Patient type: Established   Primary Reason(s) for Visit: Encounter for post-procedure evaluation of chronic illness with mild to moderate exacerbation CC: Back Pain (low)  HPI  Howard Murphy is a 62 y.o. year old, male patient, who comes today for a post-procedure evaluation. He has Benign fibroma of prostate; Elevated hemoglobin (HCC); GERD (gastroesophageal reflux disease); Hypertension; Hyperlipidemia; History of lumbar facet Synovial cyst, surgically removed.; RA (rheumatoid arthritis) ([place]); SOB (shortness of breath); Chronic abdominal pain (RUQ); Type 2 diabetes mellitus with peripheral neuropathy ([place]); Chest pain; Hypertriglyceridemia; Opiate use (60 MME/Day); Long term prescription opiate use; Long term current use of opiate analgesic; Spondylosis of lumbar spine; Lumbar facet joint syndrome ([place]); Chronic hip pain (Left); Diabetic peripheral neuropathy ([place]); Chronic lumbar radicular pain (S1 Dermatome) (Bilateral) (L>R); Hypokalemia; Complete tear of the shoulder rotator cuff (Left); Chronic shoulder pain (Primary Source of Pain) (Bilateral) (L>R); Diabetes ([place]); Chronic foot pain (Secondary source of pain) (Bilateral) (L>R); Chronic hand pain [facility] source of pain) (Bilateral) (R>L); Lumbar facet hypertrophy; Encounter for therapeutic drug level monitoring; Low testosterone; Occipital pain (Right); Failed back surgical syndrome 4; Chronic low back pain (Primary Source of Pain) (Bilateral) (L>R); Radicular pain of shoulder (Right); Carpal tunnel syndrome (Bilateral); Chronic cervical radicular pain (Right); Abnormal MRI, lumbar spine; Chronic sacroiliac joint pain  (Bilateral) (L>R); Neurogenic pain; Cervical spondylosis; Cervical facet arthropathy; Chronic neck pain (Bilateral) (R>L); Cervical facet syndrome (Bilateral) (R>L); Chronic upper extremity pain (Bilateral) (R>L); Cervical foraminal stenosis;  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, C5-6); Arthropathy of shoulder (Right); Osteoarthritis; 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); Lumbar foraminal stenosis (L4-5) (Left); Grade 1 Anterolisthesis (2 mm) of L4 over L5.; Lumbar facet (L4-5) synovial cyst (6 mm) (Left); Encounter for medication monitoring; Chronic pain syndrome; Spondylolisthesis at L3-L4 level; Benign prostatic hyperplasia; Complete tear of left rotator cuff; Bertolotti's syndrome (L5-S1) (Bilateral); 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); Medication monitoring encounter; Acute appendicitis; Other specified dorsopathies, sacral and sacrococcygeal region; and Spondylosis without myelopathy or radiculopathy, lumbosacral region on their problem list. His primarily concern today is the Back Pain (low)  Pain Assessment: Location: Lower Back Radiating: radiates down left leg to ankle in the front and back, radiates down right leg to knee in the front Onset: More than a month ago Duration: Chronic pain Quality: Sharp, Throbbing Severity: 7 /10 (subjective, self-reported pain score)  Note: Reported level is inconsistent with clinical observations. Clinically the patient looks like a 3/10 A 3/10 is viewed as "Moderate" and described as significantly interfering with activities of daily living (ADL). It becomes difficult to feed, bathe, get dressed, get on and off the toilet or to perform personal hygiene functions. Difficult to get in and out of bed or a chair without assistance. Very distracting. With effort, it can be ignored when deeply involved in activities. Discrepancy may suggest symptom exaggeration. When using our objective Pain Scale, levels between 6 and 10/10 are said to belong  in an emergency room, as it progressively worsens from a 6/10, described as severely limiting, requiring emergency care not usually available at an outpatient pain management facility. At a 6/10 level, communication becomes difficult and requires great effort. Assistance to reach the emergency department may be required. Facial  flushing and profuse sweating along with potentially dangerous increases in heart rate and blood pressure will be evident. Effect on ADL: "I cant walk much" Timing: Intermittent Modifying factors: denies BP: (!) 146/90  HR: 85  Howard Murphy comes in today for post-procedure evaluation after the treatment done on 01/28/2018.  Further details on both, my assessment(s), as well as the proposed treatment plan, please see below.  Post-Procedure Assessment  01/14/2018 Procedure: Therapeutic left-sided lumbar facet + SI joint Radiofrequency ablation under fluoroscopic guidance and IV sedation Pre-procedure pain score:  10/10 Post-procedure pain score: 4/10 (> 50% relief) Influential Factors: BMI: 30.54 kg/m Intra-procedural challenges: None observed.         Assessment challenges: None detected.              Reported side-effects: None.        Post-procedural adverse reactions or complications: None reported         Sedation: Sedation provided. When no sedatives are used, the analgesic levels obtained are directly associated to the effectiveness of the local anesthetics. However, when sedation is provided, the level of analgesia obtained during the initial 1 hour following the intervention, is believed to be the result of a combination of factors. These factors may include, but are not limited to: 1. The effectiveness of the local anesthetics used. 2. The effects of the analgesic(s) and/or anxiolytic(s) used. 3. The degree of discomfort experienced by the patient at the time of the procedure. 4. The patients ability and reliability in recalling and recording the events. 5.  The presence and influence of possible secondary gains and/or psychosocial factors. Reported result: Relief experienced during the 1st hour after the procedure: 100 % (Ultra-Short Term Relief) Howard Murphy has indicated area to have been numb during this time. Interpretative annotation: Clinically appropriate result. Analgesia during this period is likely to be Local Anesthetic and/or IV Sedative (Analgesic/Anxiolytic) related.          Effects of local anesthetic: The analgesic effects attained during this period are directly associated to the localized infiltration of local anesthetics and therefore cary significant diagnostic value as to the etiological location, or anatomical origin, of the pain. Expected duration of relief is directly dependent on the pharmacodynamics of the local anesthetic used. Long-acting (4-6 hours) anesthetics used.  Reported result: Relief during the next 4 to 6 hour after the procedure: 100 % (Short-Term Relief) Howard Murphy has indicated area to have been numb during this time. Interpretative annotation: Clinically appropriate result. Analgesia during this period is likely to be Local Anesthetic-related.          Long-term benefit: Defined as the period of time past the expected duration of local anesthetics (1 hour for short-acting and 4-6 hours for long-acting). With the possible exception of prolonged sympathetic blockade from the local anesthetics, benefits during this period are typically attributed to, or associated with, other factors such as analgesic sensory neuropraxia, antiinflammatory effects, or beneficial biochemical changes provided by agents other than the local anesthetics.  Reported result: Extended relief following procedure: 0 % (Long-Term Relief)            Interpretative annotation: Unexpected result. Recurrence of symptoms. No long-term benefit attained. Pain appears to be refractory to this treatment modality.          Current benefits: Defined as  reported results that persistent at this point in time.   Analgesia: 0-25 %            Function: Somewhat improved ROM: Somewhat  improved Interpretative annotation: Recurrence of symptoms. Limited therapeutic benefit. Results would suggest persistent aggravating factors.          Interpretation: Results would suggest failure of therapy in achieving desired goal(s).                  Plan:  Re-assessment of algesic etiology. At this point we have eliminated the lumbar facet and SI joint factors as causes of his low back pain. What remains is the patient's Bertolotti syndrome. At this point the plan is to bring the patient back for an injection of local anesthetic and steroids into day Between the L5 transverse process and the sacral ala. If this provides patient with good relief of the pain for the duration of local anesthetic, this would confirm that this is where the pain is coming from. At this point we have decided to send a referral to orthopedic surgery for a minimally invasive microendoscopic resection of the transverse process of L5 for the purpose of treating the patient's low back pain secondary to the Bertelotti,s syndrome  Laboratory Chemistry  Inflammation Markers (CRP: Acute Phase) (ESR: Chronic Phase) Lab Results  Component Value Date   CRP 1.8 11/29/2016   ESRSEDRATE 11 11/29/2016                         Renal Markers Lab Results  Component Value Date   BUN 20 12/28/2017   CREATININE 0.91 12/28/2017   BCR 14 03/07/2017   GFRAA >60 12/28/2017   GFRNONAA >60 12/28/2017                             Hepatic Markers Lab Results  Component Value Date   AST 31 12/28/2017   ALT 31 12/28/2017   ALBUMIN 4.0 12/28/2017                        Neuropathy Markers Lab Results  Component Value Date   VITAMINB12 574 11/29/2016   HGBA1C 6.9 (H) 03/07/2017   HIV Non Reactive 12/18/2017                        Hematology Parameters Lab Results  Component Value Date   PLT 290  12/28/2017   HGB 16.9 12/28/2017   HCT 46.3 12/28/2017                        CV Markers Lab Results  Component Value Date   TROPONINI <0.03 02/18/2017                         Note: Lab results reviewed.  Recent Imaging Results   Results for orders placed in visit on 01/14/18  DG C-Arm 1-60 Min-No Report   Narrative Fluoroscopy was utilized by the requesting physician.  No radiographic  interpretation.    Interpretation Report: Fluoroscopy was used during the procedure to assist with needle guidance. The images were interpreted intraoperatively by the requesting physician.  Meds   Current Outpatient Medications:  .  acetaminophen (TYLENOL) 500 MG tablet, Take 1,000 mg by mouth every 6 (six) hours as needed (for pain.)., Disp: , Rfl:  .  albuterol (PROVENTIL HFA;VENTOLIN HFA) 108 (90 Base) MCG/ACT inhaler, Inhale 2 puffs into the lungs every 6 (six) hours as needed for wheezing or shortness of breath., Disp:  1 Inhaler, Rfl: 2 .  atorvastatin (LIPITOR) 40 MG tablet, Take 1 tablet (40 mg total) by mouth at bedtime., Disp: 30 tablet, Rfl: 5 .  chlorthalidone (HYGROTON) 50 MG tablet, Take 50 mg by mouth daily., Disp: , Rfl:  .  gabapentin (NEURONTIN) 300 MG capsule, Take 300 mg by mouth 3 (three) times daily., Disp: , Rfl:  .  ibuprofen (ADVIL,MOTRIN) 600 MG tablet, Take 1 tablet (600 mg total) by mouth every 8 (eight) hours as needed., Disp: 30 tablet, Rfl: 0 .  lisinopril (PRINIVIL,ZESTRIL) 20 MG tablet, Take 1 tablet (20 mg total) by mouth daily. Reported on 12/08/2015, Disp: 30 tablet, Rfl: 5 .  ondansetron (ZOFRAN ODT) 4 MG disintegrating tablet, Take 1 tablet (4 mg total) by mouth every 8 (eight) hours as needed for nausea or vomiting., Disp: 20 tablet, Rfl: 0 .  [START ON 03/30/2018] Oxycodone HCl 10 MG TABS, Take 1 tablet (10 mg total) by mouth every 6 (six) hours as needed., Disp: 120 tablet, Rfl: 0 .  [START ON 02/28/2018] Oxycodone HCl 10 MG TABS, Take 1 tablet (10 mg total) by  mouth every 6 (six) hours as needed., Disp: 120 tablet, Rfl: 0 .  Oxycodone HCl 10 MG TABS, Take 1 tablet (10 mg total) by mouth every 6 (six) hours as needed., Disp: 120 tablet, Rfl: 0 .  potassium chloride (K-DUR) 10 MEQ tablet, Take 1 tablet (10 mEq total) by mouth daily., Disp: 7 tablet, Rfl: 0 .  pregabalin (LYRICA) 25 MG capsule, Take 25 mg by mouth 2 (two) times daily., Disp: , Rfl:  .  ranitidine (ZANTAC) 150 MG tablet, Take 150 mg by mouth daily., Disp: , Rfl:  .  sitaGLIPtin (JANUVIA) 50 MG tablet, Take 1 tablet (50 mg total) by mouth daily., Disp: 30 tablet, Rfl: 5 .  tamsulosin (FLOMAX) 0.4 MG CAPS capsule, Take 1 capsule (0.4 mg total) by mouth daily after supper., Disp: 30 capsule, Rfl: 3 .  testosterone cypionate (DEPOTESTOSTERONE CYPIONATE) 200 MG/ML injection, INJECT 1 ML INTO THE MUSCLE Q 2 WEEKS. ROTATE SITE., Disp: , Rfl: 2 .  [START ON 04/07/2018] oxyCODONE-acetaminophen (PERCOCET) 5-325 MG tablet, Take 1 tablet by mouth every 6 (six) hours as needed for severe pain., Disp: 120 tablet, Rfl: 0 .  [START ON 03/08/2018] oxyCODONE-acetaminophen (PERCOCET) 5-325 MG tablet, Take 1 tablet by mouth every 6 (six) hours as needed for severe pain., Disp: 120 tablet, Rfl: 0 .  [START ON 02/06/2018] oxyCODONE-acetaminophen (PERCOCET) 5-325 MG tablet, Take 1 tablet by mouth every 6 (six) hours as needed for severe pain., Disp: 120 tablet, Rfl: 0  ROS  Constitutional: Denies any fever or chills Gastrointestinal: No reported hemesis, hematochezia, vomiting, or acute GI distress Musculoskeletal: Denies any acute onset joint swelling, redness, loss of ROM, or weakness Neurological: No reported episodes of acute onset apraxia, aphasia, dysarthria, agnosia, amnesia, paralysis, loss of coordination, or loss of consciousness  Allergies  Howard Murphy has No Known Allergies.  [place]  Drug: Howard Murphy  reports that he does not use drugs. Alcohol:  reports that he does not drink alcohol. Tobacco:   reports that he has never smoked. He quit smokeless tobacco use about 5 years ago.  His smokeless tobacco use included chew. Medical:  has a past medical history of Arthritis, Back ache (05/05/2013), Diabetes mellitus without complication ([place]), GERD (gastroesophageal reflux disease), Headache, Hyperlipemia, Hypertension, Kidney stones, Kidney stones, Nephrolithiasis, and Shortness of breath dyspnea. Surgical: Howard Murphy  has a past surgical history that includes Cholecystectomy (  2000); Shoulder surgery (Right, 2016); Colonoscopy with propofol (N/A, 04/11/2015); Esophagogastroduodenoscopy (egd) with propofol (N/A, 04/11/2015); Shoulder surgery (Left, 06/2015); Back surgery (2015); Back surgery (08/09/2016); Cystoscopy with ureteroscopy and stent placement (Right, 08/27/2017); and laparoscopic appendectomy (N/A, 12/18/2017). Family: family history includes Cancer in his maternal grandmother, mother, sister, and sister; Heart disease in his father; Stroke in his father.  Constitutional Exam  General appearance: Well nourished, well developed, and well hydrated. In no apparent acute distress Vitals:   02/05/18 1032  BP: (!) 146/90  Pulse: 85  Resp: 18  Temp: 98.7 F (37.1 C)  SpO2: 97%  Weight: 195 lb (88.5 kg)  Height: _0  (1.702 m)   BMI Assessment: Estimated body mass index is 30.54 kg/m as calculated from the following:   Height as of this encounter: _1  (1.702 m).   Weight as of this encounter: 195 lb (88.5 kg).  BMI interpretation table: BMI level Category Range association with higher incidence of chronic pain  <18 kg/m2 Underweight   18.5-24.9 kg/m2 Ideal body weight   25-29.9 kg/m2 Overweight Increased incidence by 20%  30-34.9 kg/m2 Obese (Class I) Increased incidence by 68%  35-39.9 kg/m2 Severe obesity (Class II) Increased incidence by 136%  >40 kg/m2 Extreme obesity (Class III) Increased incidence by 254%   Patient's current BMI Ideal Body weight  Body mass index is  30.54 kg/m. Ideal body weight: 66.1 kg (145 lb 11.6 oz) Adjusted ideal body weight: 75 kg (165 lb 6.9 oz)   BMI Readings from Last 4 Encounters:  02/05/18 30.54 kg/m  01/14/18 29.76 kg/m  12/30/17 29.66 kg/m  12/26/17 29.76 kg/m   Wt Readings from Last 4 Encounters:  02/05/18 195 lb (88.5 kg)  01/14/18 190 lb (86.2 kg)  12/30/17 189 lb 6.4 oz (85.9 kg)  12/26/17 190 lb (86.2 kg)  Psych/Mental status: Alert, oriented x 3 (person, place, & time)       Eyes: PERLA Respiratory: No evidence of acute respiratory distress  Cervical Spine Area Exam  Skin & Axial Inspection: No masses, redness, edema, swelling, or associated skin lesions Alignment: Symmetrical Functional ROM: Unrestricted ROM      Stability: No instability detected Muscle Tone/Strength: Functionally intact. No obvious neuro-muscular anomalies detected. Sensory (Neurological): Unimpaired Palpation: No palpable anomalies              Upper Extremity (UE) Exam    Side: Right upper extremity  Side: Left upper extremity  Skin & Extremity Inspection: Skin color, temperature, and hair growth are WNL. No peripheral edema or cyanosis. No masses, redness, swelling, asymmetry, or associated skin lesions. No contractures.  Skin & Extremity Inspection: Skin color, temperature, and hair growth are WNL. No peripheral edema or cyanosis. No masses, redness, swelling, asymmetry, or associated skin lesions. No contractures.  Functional ROM: Unrestricted ROM          Functional ROM: Unrestricted ROM          Muscle Tone/Strength: Functionally intact. No obvious neuro-muscular anomalies detected.  Muscle Tone/Strength: Functionally intact. No obvious neuro-muscular anomalies detected.  Sensory (Neurological): Unimpaired          Sensory (Neurological): Unimpaired          Palpation: No palpable anomalies              Palpation: No palpable anomalies              Provocative Test(s):  Phalen's test: deferred Tinel's test: deferred Apley's  scratch test (touch opposite shoulder):  Action 1 (  Across chest): deferred Action 2 (Overhead): deferred Action 3 (LB reach): deferred   Provocative Test(s):  Phalen's test: deferred Tinel's test: deferred Apley's scratch test (touch opposite shoulder):  Action 1 (Across chest): deferred Action 2 (Overhead): deferred Action 3 (LB reach): deferred    Thoracic Spine Area Exam  Skin & Axial Inspection: No masses, redness, or swelling Alignment: Symmetrical Functional ROM: Unrestricted ROM Stability: No instability detected Muscle Tone/Strength: Functionally intact. No obvious neuro-muscular anomalies detected. Sensory (Neurological): Unimpaired Muscle strength & Tone: No palpable anomalies  Lumbar Spine Area Exam  Skin & Axial Inspection: No masses, redness, or swelling Alignment: Symmetrical Functional ROM: Minimal ROM       Stability: No instability detected Muscle Tone/Strength: Functionally intact. No obvious neuro-muscular anomalies detected. Sensory (Neurological): Movement-associated pain Palpation: Complains of area being tender to palpation       Provocative Tests: Hyperextension/rotation test: Equivocal       Lumbar quadrant test (Kemp's test): Equivocal       Lateral bending test: deferred today       Patrick's Maneuver: deferred today                   FABER test: deferred today                   S-I anterior distraction/compression test: deferred today         S-I lateral compression test: deferred today         S-I Thigh-thrust test: deferred today         S-I Gaenslen's test: deferred today          Gait & Posture Assessment  Ambulation: Unassisted Gait: Relatively normal for age and body habitus Posture: WNL   Lower Extremity Exam    Side: Right lower extremity  Side: Left lower extremity  Stability: No instability observed          Stability: No instability observed          Skin & Extremity Inspection: Skin color, temperature, and hair growth are WNL.  No peripheral edema or cyanosis. No masses, redness, swelling, asymmetry, or associated skin lesions. No contractures.  Skin & Extremity Inspection: Skin color, temperature, and hair growth are WNL. No peripheral edema or cyanosis. No masses, redness, swelling, asymmetry, or associated skin lesions. No contractures.  Functional ROM: Unrestricted ROM                  Functional ROM: Unrestricted ROM                  Muscle Tone/Strength: Functionally intact. No obvious neuro-muscular anomalies detected.  Muscle Tone/Strength: Functionally intact. No obvious neuro-muscular anomalies detected.  Sensory (Neurological): Unimpaired  Sensory (Neurological): Unimpaired  Palpation: No palpable anomalies  Palpation: No palpable anomalies   Assessment  Primary Diagnosis & Pertinent Problem List: The primary encounter diagnosis was Chronic sacroiliac joint pain (Bilateral) (L>R). Diagnoses of Chronic low back pain (Primary Source of Pain) (Bilateral) (L>R), Bertolotti's syndrome (L5-S1) (Bilateral), and Chronic pain syndrome were also pertinent to this visit.  Status Diagnosis  Controlled Controlled Unimproved 1. Chronic sacroiliac joint pain (Bilateral) (L>R)   2. Chronic low back pain (Primary Source of Pain) (Bilateral) (L>R)   3. Bertolotti's syndrome (L5-S1) (Bilateral)   4. Chronic pain syndrome     Problems updated and reviewed during this visit: No problems updated. Plan of Care  Pharmacotherapy (Medications Ordered): Meds ordered this encounter  Medications  . orphenadrine (NORFLEX) injection 60  mg  . ketorolac (TORADOL) injection 60 mg  . oxyCODONE-acetaminophen (PERCOCET) 5-325 MG tablet    Sig: Take 1 tablet by mouth every 6 (six) hours as needed for severe pain.    Dispense:  120 tablet    Refill:  0    Medication for Chronic Pain (G89.4). [place] STOP ACT - Not applicable. Fill one day early if pharmacy is closed on scheduled refill date.  Do not fill until: 04/07/18 To last until:  05/07/18  . oxyCODONE-acetaminophen (PERCOCET) 5-325 MG tablet    Sig: Take 1 tablet by mouth every 6 (six) hours as needed for severe pain.    Dispense:  120 tablet    Refill:  0    Medication for Chronic Pain (G89.4). [place] STOP ACT - Not applicable. Fill one day early if pharmacy is closed on scheduled refill date.  Do not fill until: 03/08/18 To last until: 04/07/18  . oxyCODONE-acetaminophen (PERCOCET) 5-325 MG tablet    Sig: Take 1 tablet by mouth every 6 (six) hours as needed for severe pain.    Dispense:  120 tablet    Refill:  0    Medication for Chronic Pain (G89.4). [place] STOP ACT - Not applicable. Fill one day early if pharmacy is closed on scheduled refill date.  Do not fill until: 02/06/18 To last until: 03/08/18   Medications administered today: We administered orphenadrine and ketorolac.   Procedure Orders     SACROILIAC JOINT INJECTION Lab Orders  No laboratory test(s) ordered today   Imaging Orders  No imaging studies ordered today    Referral Orders     Ambulatory referral to Orthopedic Surgery  Interventional management options: Planned, scheduled, and/or pending:   -Palliative/Diagnostic Left SI Block (Area of L5 Transverse process spatulation), under fluoroscopy and IV sedation. -IM Toradol/Norflex 60/60 today. -Referral to orthopedic surgery for minimally invasive microendoscopic resection of the left L5 transverse process for the treatment of the patient's low back pain associated with Bertolotti's syndrome.   Considering:   Diagnostic bilateral cervical facet block Possible bilateral cervical facet radiofrequency ablation  Diagnostic right-sided greater occipital nerve block Possible right-sided greater occipital nerve radiofrequencyablation  Possible right-sided peripheral nerve stimulator trial  Diagnostic right-sided cervical epidural steroid injection  Diagnostic bilateral intra-articular shoulder injection Diagnostic bilateral suprascapular  nerve block Possible bilateral suprascapular nerve radiofrequencyablation  Diagnostic bilateral sacroiliac joint block Possible bilateral sacroiliac joint radiofrequencyablation  Diagnostic bilateral lumbar facet block Possible bilateral lumbar facet radiofrequencyablation  Diagnostic left L4-5 transforaminal epiduralsteroid injection  Diagnostic left-sided L4-5 lumbar epidural steroid injection  Diagnostic left intra-articular hip joint injection Possible left hip joint radiofrequencyablation  Diagnostic bilateral median nerve block(carpal tunnel block)  Diagnostic right-sided celiac plexus block   Palliative PRN treatment(s):   None at this time   Provider-requested follow-up: Return for Procedure (w/ sedation): (L) SI BLK (Bartolitti's Syndrome).  Future Appointments  Date Time Provider [place]  02/06/2018 12:45 PM Milinda Pointer, MD [facility] None  02/11/2018 10:30 AM Florene Glen, MD [facility] None  02/25/2018  9:30 AM Vevelyn Francois, NP [facility] None   Primary Care Physician: [facility] [place] Location: [facility] Outpatient Pain Management Facility Note by: Gaspar Cola, MD Date: 02/05/2018; Time: 5:22 PM

## 2018-02-05 ENCOUNTER — Ambulatory Visit: Payer: Medicare Other | Attending: Pain Medicine | Admitting: Pain Medicine

## 2018-02-05 ENCOUNTER — Encounter: Payer: Self-pay | Admitting: Pain Medicine

## 2018-02-05 ENCOUNTER — Other Ambulatory Visit: Payer: Self-pay

## 2018-02-05 VITALS — BP 146/90 | HR 85 | Temp 98.7°F | Resp 18 | Ht 67.0 in | Wt 195.0 lb

## 2018-02-05 DIAGNOSIS — G894 Chronic pain syndrome: Secondary | ICD-10-CM

## 2018-02-05 DIAGNOSIS — G8929 Other chronic pain: Secondary | ICD-10-CM | POA: Diagnosis not present

## 2018-02-05 DIAGNOSIS — M4316 Spondylolisthesis, lumbar region: Secondary | ICD-10-CM | POA: Insufficient documentation

## 2018-02-05 DIAGNOSIS — E876 Hypokalemia: Secondary | ICD-10-CM | POA: Diagnosis not present

## 2018-02-05 DIAGNOSIS — Q7649 Other congenital malformations of spine, not associated with scoliosis: Secondary | ICD-10-CM | POA: Insufficient documentation

## 2018-02-05 DIAGNOSIS — K219 Gastro-esophageal reflux disease without esophagitis: Secondary | ICD-10-CM | POA: Insufficient documentation

## 2018-02-05 DIAGNOSIS — E781 Pure hyperglyceridemia: Secondary | ICD-10-CM | POA: Insufficient documentation

## 2018-02-05 DIAGNOSIS — N400 Enlarged prostate without lower urinary tract symptoms: Secondary | ICD-10-CM | POA: Insufficient documentation

## 2018-02-05 DIAGNOSIS — M48061 Spinal stenosis, lumbar region without neurogenic claudication: Secondary | ICD-10-CM | POA: Insufficient documentation

## 2018-02-05 DIAGNOSIS — I10 Essential (primary) hypertension: Secondary | ICD-10-CM | POA: Insufficient documentation

## 2018-02-05 DIAGNOSIS — E1142 Type 2 diabetes mellitus with diabetic polyneuropathy: Secondary | ICD-10-CM | POA: Insufficient documentation

## 2018-02-05 DIAGNOSIS — M069 Rheumatoid arthritis, unspecified: Secondary | ICD-10-CM | POA: Insufficient documentation

## 2018-02-05 DIAGNOSIS — E785 Hyperlipidemia, unspecified: Secondary | ICD-10-CM | POA: Insufficient documentation

## 2018-02-05 DIAGNOSIS — Z79891 Long term (current) use of opiate analgesic: Secondary | ICD-10-CM | POA: Insufficient documentation

## 2018-02-05 DIAGNOSIS — Z87891 Personal history of nicotine dependence: Secondary | ICD-10-CM | POA: Diagnosis not present

## 2018-02-05 DIAGNOSIS — Z79899 Other long term (current) drug therapy: Secondary | ICD-10-CM | POA: Insufficient documentation

## 2018-02-05 DIAGNOSIS — M545 Low back pain, unspecified: Secondary | ICD-10-CM

## 2018-02-05 DIAGNOSIS — M533 Sacrococcygeal disorders, not elsewhere classified: Secondary | ICD-10-CM | POA: Diagnosis not present

## 2018-02-05 MED ORDER — ORPHENADRINE CITRATE 30 MG/ML IJ SOLN
60.0000 mg | Freq: Once | INTRAMUSCULAR | Status: AC
  Administered 2018-02-05: 60 mg via INTRAMUSCULAR

## 2018-02-05 MED ORDER — OXYCODONE-ACETAMINOPHEN 5-325 MG PO TABS: 1.0000 | ORAL_TABLET | Freq: Four times a day (QID) | ORAL | 0 refills | Status: DC | PRN

## 2018-02-05 MED ORDER — KETOROLAC TROMETHAMINE 60 MG/2ML IM SOLN
60.0000 mg | Freq: Once | INTRAMUSCULAR | Status: AC
  Administered 2018-02-05: 60 mg via INTRAMUSCULAR

## 2018-02-05 MED ORDER — KETOROLAC TROMETHAMINE 60 MG/2ML IM SOLN
INTRAMUSCULAR | Status: AC
  Filled 2018-02-05: qty 2

## 2018-02-05 MED ORDER — ORPHENADRINE CITRATE 30 MG/ML IJ SOLN
INTRAMUSCULAR | Status: AC
  Filled 2018-02-05: qty 2

## 2018-02-05 NOTE — Patient Instructions (Signed)
____________________________________________________________________________________________  Preparing for Procedure with Sedation  Instructions: . Oral Intake: Do not eat or drink anything for at least 8 hours prior to your procedure. . Transportation: Public transportation is not allowed. Bring an adult driver. The driver must be physically present in our waiting room before any procedure can be started. . Physical Assistance: Bring an adult physically capable of assisting you, in the event you need help. This adult should keep you company at home for at least 6 hours after the procedure. . Blood Pressure Medicine: Take your blood pressure medicine with a sip of water the morning of the procedure. . Blood thinners: Notify our staff if you are taking any blood thinners. Depending on which one you take, there will be specific instructions on how and when to stop it. . Diabetics on insulin: Notify the staff so that you can be scheduled 1st case in the morning. If your diabetes requires high dose insulin, take only  of your normal insulin dose the morning of the procedure and notify the staff that you have done so. . Preventing infections: Shower with an antibacterial soap the morning of your procedure. . Build-up your immune system: Take 1000 mg of Vitamin C with every meal (3 times a day) the day prior to your procedure. . Antibiotics: Inform the staff if you have a condition or reason that requires you to take antibiotics before dental procedures. . Pregnancy: If you are pregnant, call and cancel the procedure. . Sickness: If you have a cold, fever, or any active infections, call and cancel the procedure. . Arrival: You must be in the facility at least 30 minutes prior to your scheduled procedure. . Children: Do not bring children with you. . Dress appropriately: Bring dark clothing that you would not mind if they get stained. . Valuables: Do not bring any jewelry or valuables.  Procedure  appointments are reserved for interventional treatments only. . No Prescription Refills. . No medication changes will be discussed during procedure appointments. . No disability issues will be discussed.  Reasons to call and reschedule or cancel your procedure: (Following these recommendations will minimize the risk of a serious complication.) . Surgeries: Avoid having procedures within 2 weeks of any surgery. (Avoid for 2 weeks before or after any surgery). . Flu Shots: Avoid having procedures within 2 weeks of a flu shots or . (Avoid for 2 weeks before or after immunizations). . Barium: Avoid having a procedure within 7-10 days after having had a radiological study involving the use of radiological contrast. (Myelograms, Barium swallow or enema study). . Heart attacks: Avoid any elective procedures or surgeries for the initial 6 months after a "Myocardial Infarction" (Heart Attack). . Blood thinners: It is imperative that you stop these medications before procedures. Let us know if you if you take any blood thinner.  . Infection: Avoid procedures during or within two weeks of an infection (including chest colds or gastrointestinal problems). Symptoms associated with infections include: Localized redness, fever, chills, night sweats or profuse sweating, burning sensation when voiding, cough, congestion, stuffiness, runny nose, sore throat, diarrhea, nausea, vomiting, cold or Flu symptoms, recent or current infections. It is specially important if the infection is over the area that we intend to treat. . Heart and lung problems: Symptoms that may suggest an active cardiopulmonary problem include: cough, chest pain, breathing difficulties or shortness of breath, dizziness, ankle swelling, uncontrolled high or unusually low blood pressure, and/or palpitations. If you are experiencing any of these symptoms, cancel   your procedure and contact your primary care physician for an evaluation.  Remember:   Regular Business hours are:  Monday to Thursday 8:00 AM to 4:00 PM  Provider's Schedule: Jameisha Stofko, MD:  Procedure days: Tuesday and Thursday 7:30 AM to 4:00 PM  Bilal Lateef, MD:  Procedure days: Monday and Wednesday 7:30 AM to 4:00 PM ____________________________________________________________________________________________    

## 2018-02-05 NOTE — Progress Notes (Signed)
Safety precautions to be maintained throughout the outpatient stay will include: orient to surroundings, keep bed in low position, maintain call bell within reach at all times, provide assistance with transfer out of bed and ambulation.  

## 2018-02-06 ENCOUNTER — Encounter: Payer: Self-pay | Admitting: Pain Medicine

## 2018-02-06 ENCOUNTER — Ambulatory Visit (HOSPITAL_BASED_OUTPATIENT_CLINIC_OR_DEPARTMENT_OTHER): Payer: Medicare Other | Admitting: Pain Medicine

## 2018-02-06 ENCOUNTER — Other Ambulatory Visit: Payer: Self-pay

## 2018-02-06 ENCOUNTER — Ambulatory Visit
Admission: RE | Admit: 2018-02-06 | Discharge: 2018-02-06 | Disposition: A | Discharge status: Home / Self Care | Payer: Medicare Other | Source: Ambulatory Visit | Attending: Pain Medicine | Admitting: Pain Medicine

## 2018-02-06 ENCOUNTER — Telehealth: Payer: Self-pay

## 2018-02-06 VITALS — BP 117/70 | HR 79 | Temp 97.8°F | Resp 10 | Ht 67.0 in | Wt 195.0 lb

## 2018-02-06 DIAGNOSIS — M533 Sacrococcygeal disorders, not elsewhere classified: Secondary | ICD-10-CM | POA: Insufficient documentation

## 2018-02-06 DIAGNOSIS — Z79899 Other long term (current) drug therapy: Secondary | ICD-10-CM | POA: Diagnosis not present

## 2018-02-06 DIAGNOSIS — M5388 Other specified dorsopathies, sacral and sacrococcygeal region: Secondary | ICD-10-CM

## 2018-02-06 DIAGNOSIS — Q7649 Other congenital malformations of spine, not associated with scoliosis: Secondary | ICD-10-CM | POA: Diagnosis not present

## 2018-02-06 DIAGNOSIS — M545 Low back pain, unspecified: Secondary | ICD-10-CM

## 2018-02-06 DIAGNOSIS — Z79891 Long term (current) use of opiate analgesic: Secondary | ICD-10-CM | POA: Diagnosis not present

## 2018-02-06 DIAGNOSIS — G8929 Other chronic pain: Secondary | ICD-10-CM | POA: Diagnosis not present

## 2018-02-06 DIAGNOSIS — Z9049 Acquired absence of other specified parts of digestive tract: Secondary | ICD-10-CM | POA: Insufficient documentation

## 2018-02-06 DIAGNOSIS — Z7984 Long term (current) use of oral hypoglycemic drugs: Secondary | ICD-10-CM | POA: Insufficient documentation

## 2018-02-06 DIAGNOSIS — F419 Anxiety disorder, unspecified: Secondary | ICD-10-CM | POA: Diagnosis not present

## 2018-02-06 MED ORDER — LACTATED RINGERS IV SOLN
1000.0000 mL | Freq: Once | INTRAVENOUS | Status: AC
  Administered 2018-02-06: 1000 mL via INTRAVENOUS

## 2018-02-06 MED ORDER — FENTANYL CITRATE (PF) 100 MCG/2ML IJ SOLN
25.0000 ug | INTRAMUSCULAR | Status: DC | PRN
  Administered 2018-02-06: 100 ug via INTRAVENOUS
  Filled 2018-02-06: qty 2

## 2018-02-06 MED ORDER — LIDOCAINE HCL 2 % IJ SOLN
20.0000 mL | Freq: Once | INTRAMUSCULAR | Status: AC
  Administered 2018-02-06: 400 mg
  Filled 2018-02-06: qty 40

## 2018-02-06 MED ORDER — ROPIVACAINE HCL 2 MG/ML IJ SOLN
4.0000 mL | Freq: Once | INTRAMUSCULAR | Status: AC
  Administered 2018-02-06: 4 mL via INTRA_ARTICULAR
  Filled 2018-02-06: qty 10

## 2018-02-06 MED ORDER — MIDAZOLAM HCL 5 MG/5ML IJ SOLN
1.0000 mg | INTRAMUSCULAR | Status: DC | PRN
  Administered 2018-02-06: 3 mg via INTRAVENOUS
  Filled 2018-02-06: qty 5

## 2018-02-06 MED ORDER — IOPAMIDOL (ISOVUE-M 200) INJECTION 41%
10.0000 mL | Freq: Once | INTRAMUSCULAR | Status: AC
  Administered 2018-02-06: 1 mL via EPIDURAL
  Filled 2018-02-06: qty 10

## 2018-02-06 MED ORDER — METHYLPREDNISOLONE ACETATE 80 MG/ML IJ SUSP
80.0000 mg | Freq: Once | INTRAMUSCULAR | Status: AC
  Administered 2018-02-06: 80 mg via INTRA_ARTICULAR
  Filled 2018-02-06: qty 1

## 2018-02-06 NOTE — Progress Notes (Signed)
Patient's Name: Howard Murphy  MRN: [id number]  Referring Provider: [facility] [place] [facility]*  DOB: 03/13/1956  PCP: [facility] [place]  DOS: 02/06/2018  Note by: Gaspar Cola, MD  Service setting: Ambulatory outpatient  Specialty: Interventional Pain Management  Patient type: Established  Location: [facility] (AMB) Pain Management Facility  Visit type: Interventional Procedure   Primary Reason for Visit: Interventional Pain Management Treatment. CC: Back Pain (lower)  Procedure:          Anesthesia, Analgesia, Anxiolysis:  Type: Diagnostic L5 Transverse process- Sacral Ala pseudoarthrosis Steroid Injection #1  Region: Superior Lumbosacral Region Level: PSIS (Posterior Superior Iliac Spine) Laterality: Left-Sided  Type: Moderate (Conscious) Sedation combined with Local Anesthesia Indication(s): Analgesia and Anxiety Route: Intravenous (IV) IV Access: Secured Sedation: Meaningful verbal contact was maintained at all times during the procedure  Local Anesthetic: Lidocaine 1-2%  Position: Prone           Indications: 1. Bertolotti's syndrome (L5-S1) (Bilateral)   2. Chronic low back pain (Primary Source of Pain) (Bilateral) (L>R)   3. Chronic sacroiliac joint pain (Bilateral) (L>R)   4. Other specified dorsopathies, sacral and sacrococcygeal region    Pain Score: Pre-procedure: 6 /10 Post-procedure: 0-No pain/10  Pre-op Assessment:  Howard Murphy is a 62 y.o. (year old), male patient, seen today for interventional treatment. He  has a past surgical history that includes Cholecystectomy (2000); Shoulder surgery (Right, 2016); Colonoscopy with propofol (N/A, 04/11/2015); Esophagogastroduodenoscopy (egd) with propofol (N/A, 04/11/2015); Shoulder surgery (Left, 06/2015); Back surgery (2015); Back surgery (08/09/2016); Cystoscopy with ureteroscopy and stent placement (Right, 08/27/2017); and laparoscopic appendectomy (N/A, 12/18/2017). Howard Murphy has a current medication list which  includes the following prescription(s): acetaminophen, albuterol, atorvastatin, chlorthalidone, gabapentin, ibuprofen, lisinopril, ondansetron, oxycodone hcl, oxycodone hcl, oxycodone hcl, potassium chloride, pregabalin, ranitidine, sitagliptin, tamsulosin, and testosterone cypionate, and the following Facility-Administered Medications: fentanyl and midazolam. His primarily concern today is the Back Pain (lower)  Initial Vital Signs:  Pulse/HCG Rate: (!) 111ECG Heart Rate: 79 Temp: 98.1 F (36.7 C) Resp: 12 BP: (!) 135/98 SpO2: 98 %  BMI: Estimated body mass index is 30.54 kg/m as calculated from the following:   Height as of this encounter: 5\' 7"  (1.702 m).   Weight as of this encounter: 195 lb (88.5 kg).  Risk Assessment: Allergies: Reviewed. He has No Known Allergies.  Allergy Precautions: None required Coagulopathies: Reviewed. None identified.  Blood-thinner therapy: None at this time Active Infection(s): Reviewed. None identified. Howard Murphy is afebrile  Site Confirmation: Howard Murphy was asked to confirm the procedure and laterality before marking the site Procedure checklist: Completed Consent: Before the procedure and under the influence of no sedative(s), amnesic(s), or anxiolytics, the patient was informed of the treatment options, risks and possible complications. To fulfill our ethical and legal obligations, as recommended by the American Medical Association's Code of Ethics, I have informed the patient of my clinical impression; the nature and purpose of the treatment or procedure; the risks, benefits, and possible complications of the intervention; the alternatives, including doing nothing; the risk(s) and benefit(s) of the alternative treatment(s) or procedure(s); and the risk(s) and benefit(s) of doing nothing. The patient was provided information about the general risks and possible complications associated with the procedure. These may include, but are not limited to:  failure to achieve desired goals, infection, bleeding, organ or nerve damage, allergic reactions, paralysis, and death. In addition, the patient was informed of those risks and complications associated to the procedure, such as failure to  decrease pain; infection; bleeding; organ or nerve damage with subsequent damage to sensory, motor, and/or autonomic systems, resulting in permanent pain, numbness, and/or weakness of one or several areas of the body; allergic reactions; (i.e.: anaphylactic reaction); and/or death. Furthermore, the patient was informed of those risks and complications associated with the medications. These include, but are not limited to: allergic reactions (i.e.: anaphylactic or anaphylactoid reaction(s)); adrenal axis suppression; blood sugar elevation that in diabetics may result in ketoacidosis or comma; water retention that in patients with history of congestive heart failure may result in shortness of breath, pulmonary edema, and decompensation with resultant heart failure; weight gain; swelling or edema; medication-induced neural toxicity; particulate matter embolism and blood vessel occlusion with resultant organ, and/or nervous system infarction; and/or aseptic necrosis of one or more joints. Finally, the patient was informed that Medicine is not an exact science; therefore, there is also the possibility of unforeseen or unpredictable risks and/or possible complications that may result in a catastrophic outcome. The patient indicated having understood very clearly. We have given the patient no guarantees and we have made no promises. Enough time was given to the patient to ask questions, all of which were answered to the patient's satisfaction. Howard Murphy has indicated that he wanted to continue with the procedure. Attestation: I, the ordering provider, attest that I have discussed with the patient the benefits, risks, side-effects, alternatives, likelihood of achieving goals, and  potential problems during recovery for the procedure that I have provided informed consent. Date  Time: 02/06/2018 11:21 AM  Pre-Procedure Preparation:  Monitoring: As per clinic protocol. Respiration, ETCO2, SpO2, BP, heart rate and rhythm monitor placed and checked for adequate function Safety Precautions: Patient was assessed for positional comfort and pressure points before starting the procedure. Time-out: I initiated and conducted the "Time-out" before starting the procedure, as per protocol. The patient was asked to participate by confirming the accuracy of the "Time Out" information. Verification of the correct person, site, and procedure were performed and confirmed by me, the nursing staff, and the patient. "Time-out" conducted as per Joint Commission's Universal Protocol (UP.01.01.01). Time: 1220  Description of Procedure:          Target Area: The space between the inferolateral lateral portion of the distal segment of the left L5 transverse process and the superior aspect of the sacral ala where there appears to be a pseudoarthrosis and the basis for the Bertolotti syndrome. Approach: Posterior, paraspinal, ipsilateral approach. Area Prepped: Entire Lower Lumbosacral Region Prepping solution: ChloraPrep (2% chlorhexidine gluconate and 70% isopropyl alcohol) Safety Precautions: Aspiration looking for blood return was conducted prior to all injections. At no point did we inject any substances, as a needle was being advanced. No attempts were made at seeking any paresthesias. Safe injection practices and needle disposal techniques used. Medications properly checked for expiration dates. SDV (single dose vial) medications used. Description of the Procedure: Protocol guidelines were followed. The patient was placed in position over the procedure table. The target area was identified and the area prepped in the usual manner. Skin & deeper tissues infiltrated with local anesthetic. Appropriate  amount of time allowed to pass for local anesthetics to take effect. The procedure needle was advanced under fluoroscopic guidance into the sacroiliac joint until a firm endpoint was obtained. Proper needle placement secured. Negative aspiration confirmed. Solution injected in intermittent fashion, asking for systemic symptoms every 0.5cc of injectate. The needles were then removed and the area cleansed, making sure to leave some of the  prepping solution back to take advantage of its long term bactericidal properties. Vitals:   02/06/18 1237 02/06/18 1247 02/06/18 1257 02/06/18 1307  BP: (!) 135/95 122/70 124/73 117/70  Pulse:  75 75 79  Resp: 13 11 12 10   Temp:  97.8 F (36.6 C)  97.8 F (36.6 C)  TempSrc:  Temporal    SpO2: 93% 95% 98% 95%  Weight:      Height:        Start Time: 1220 hrs. End Time: 1237 hrs. Materials:  Needle(s) Type: Regular needle Gauge: 22G Length: 3.5-in Medication(s): Please see orders for medications and dosing details.  Imaging Guidance (Non-Spinal):          Type of Imaging Technique: Fluoroscopy Guidance (Non-Spinal) Indication(s): Assistance in needle guidance and placement for procedures requiring needle placement in or near specific anatomical locations not easily accessible without such assistance. Exposure Time: Please see nurses notes. Contrast: Before injecting any contrast, we confirmed that the patient did not have an allergy to iodine, shellfish, or radiological contrast. Once satisfactory needle placement was completed at the desired level, radiological contrast was injected. Contrast injected under live fluoroscopy. No contrast complications. See chart for type and volume of contrast used. Fluoroscopic Guidance: I was personally present during the use of fluoroscopy. "Tunnel Vision Technique" used to obtain the best possible view of the target area. Parallax error corrected before commencing the procedure. "Direction-depth-direction" technique used  to introduce the needle under continuous pulsed fluoroscopy. Once target was reached, antero-posterior, oblique, and lateral fluoroscopic projection used confirm needle placement in all planes. Images permanently stored in EMR.    Interpretation: I personally interpreted the imaging intraoperatively. Adequate needle placement confirmed in multiple planes. Appropriate spread of contrast into desired area was observed. No evidence of afferent or efferent intravascular uptake. Permanent images saved into the patient's record.  Antibiotic Prophylaxis:   Anti-infectives (From admission, onward)   None     Indication(s): None identified  Post-operative Assessment:  Post-procedure Vital Signs:  Pulse/HCG Rate: 7978 Temp: 97.8 F (36.6 C) Resp: 10 BP: 117/70 SpO2: 95 %  EBL: None  Complications: No immediate post-treatment complications observed by team, or reported by patient.  Note: The patient tolerated the entire procedure well. A repeat set of vitals were taken after the procedure and the patient was kept under observation following institutional policy, for this type of procedure. Post-procedural neurological assessment was performed, showing return to baseline, prior to discharge. The patient was provided with post-procedure discharge instructions, including a section on how to identify potential problems. Should any problems arise concerning this procedure, the patient was given instructions to immediately contact us, at any time, without hesitation. In any case, we plan to contact the patient by telephone for a follow-up status report regarding this interventional procedure.  Comments:  No additional relevant information.  Plan of Care    Imaging Orders     DG C-Arm 1-60 Min-No Report  Procedure Orders     SACROILIAC JOINT INJECTION  Medications ordered for procedure: Meds ordered this encounter  Medications  . iopamidol (ISOVUE-M) 41 % intrathecal injection 10 mL    Must  be Myelogram-compatible. If not available, you may substitute with a water-soluble, non-ionic, hypoallergenic, myelogram-compatible radiological contrast medium.  Marland Kitchen lidocaine (XYLOCAINE) 2 % (with pres) injection 400 mg  . midazolam (VERSED) 5 MG/5ML injection 1-2 mg    Make sure Flumazenil is available in the pyxis when using this medication. If oversedation occurs, administer 0.2 mg IV over  15 sec. If after 45 sec no response, administer 0.2 mg again over 1 min; may repeat at 1 min intervals; not to exceed 4 doses (1 mg)  . fentaNYL (SUBLIMAZE) injection 25-50 mcg    Make sure Narcan is available in the pyxis when using this medication. In the event of respiratory depression (RR< 8/min): Titrate NARCAN (naloxone) in increments of 0.1 to 0.2 mg IV at 2-3 minute intervals, until desired degree of reversal.  . lactated ringers infusion 1,000 mL  . ropivacaine (PF) 2 mg/mL (0.2%) (NAROPIN) injection 4 mL  . methylPREDNISolone acetate (DEPO-MEDROL) injection 80 mg   Medications administered: We administered iopamidol, lidocaine, midazolam, fentaNYL, lactated ringers, ropivacaine (PF) 2 mg/mL (0.2%), and methylPREDNISolone acetate.  See the medical record for exact dosing, route, and time of administration.  New Prescriptions   No medications on file   Disposition: Discharge home  Discharge Date & Time: 02/06/2018; 1310 hrs.   Physician-requested Follow-up: Return for post-procedure eval (2 wks), w/ Dr. Dossie Arbour.  Future Appointments  Date Time Provider [place]  02/11/2018 10:30 AM Florene Glen, MD [facility] None  02/24/2018  1:00 PM Milinda Pointer, MD [facility] None   Primary Care Physician: [facility] [place] Location: [facility] Outpatient Pain Management Facility Note by: Gaspar Cola, MD Date: 02/06/2018; Time: 1:44 PM  Disclaimer:  Medicine is not an Chief Strategy Officer. The only guarantee in medicine is that nothing is guaranteed. It is important to note that the  decision to proceed with this intervention was based on the information collected from the patient. The Data and conclusions were drawn from the patient's questionnaire, the interview, and the physical examination. Because the information was provided in large part by the patient, it cannot be guaranteed that it has not been purposely or unconsciously manipulated. Every effort has been made to obtain as much relevant data as possible for this evaluation. It is important to note that the conclusions that lead to this procedure are derived in large part from the available data. Always take into account that the treatment will also be dependent on availability of resources and existing treatment guidelines, considered by other Pain Management Practitioners as being common knowledge and practice, at the time of the intervention. For Medico-Legal purposes, it is also important to point out that variation in procedural techniques and pharmacological choices are the acceptable norm. The indications, contraindications, technique, and results of the above procedure should only be interpreted and judged by a Board-Certified Interventional Pain Specialist with extensive familiarity and expertise in the same exact procedure and technique.

## 2018-02-06 NOTE — Telephone Encounter (Signed)
Patient is coming in for procedure today.  Will discuss with him then.

## 2018-02-06 NOTE — Patient Instructions (Signed)

## 2018-02-06 NOTE — Telephone Encounter (Signed)
Attempted to call for post procedure phone call and to inform patient that he has 3 scripts here to be picked up but he needs to bring back his bottle to be counted.  Left message to return our call.

## 2018-02-10 DIAGNOSIS — R109 Unspecified abdominal pain: Secondary | ICD-10-CM | POA: Diagnosis not present

## 2018-02-10 DIAGNOSIS — I10 Essential (primary) hypertension: Secondary | ICD-10-CM | POA: Diagnosis not present

## 2018-02-10 DIAGNOSIS — Z79899 Other long term (current) drug therapy: Secondary | ICD-10-CM | POA: Diagnosis not present

## 2018-02-10 DIAGNOSIS — Z79891 Long term (current) use of opiate analgesic: Secondary | ICD-10-CM | POA: Diagnosis not present

## 2018-02-10 DIAGNOSIS — Z9089 Acquired absence of other organs: Secondary | ICD-10-CM | POA: Diagnosis not present

## 2018-02-10 DIAGNOSIS — R39198 Other difficulties with micturition: Secondary | ICD-10-CM | POA: Diagnosis not present

## 2018-02-10 DIAGNOSIS — E119 Type 2 diabetes mellitus without complications: Secondary | ICD-10-CM | POA: Diagnosis not present

## 2018-02-10 DIAGNOSIS — K219 Gastro-esophageal reflux disease without esophagitis: Secondary | ICD-10-CM | POA: Diagnosis not present

## 2018-02-10 DIAGNOSIS — R1031 Right lower quadrant pain: Secondary | ICD-10-CM | POA: Diagnosis not present

## 2018-02-10 DIAGNOSIS — Z87442 Personal history of urinary calculi: Secondary | ICD-10-CM | POA: Diagnosis not present

## 2018-02-10 DIAGNOSIS — R110 Nausea: Secondary | ICD-10-CM | POA: Diagnosis not present

## 2018-02-10 DIAGNOSIS — R300 Dysuria: Secondary | ICD-10-CM | POA: Diagnosis not present

## 2018-02-10 DIAGNOSIS — M069 Rheumatoid arthritis, unspecified: Secondary | ICD-10-CM | POA: Diagnosis not present

## 2018-02-10 DIAGNOSIS — M1990 Unspecified osteoarthritis, unspecified site: Secondary | ICD-10-CM | POA: Diagnosis not present

## 2018-02-10 DIAGNOSIS — N400 Enlarged prostate without lower urinary tract symptoms: Secondary | ICD-10-CM | POA: Diagnosis not present

## 2018-02-10 DIAGNOSIS — E785 Hyperlipidemia, unspecified: Secondary | ICD-10-CM | POA: Diagnosis not present

## 2018-02-10 DIAGNOSIS — Z9049 Acquired absence of other specified parts of digestive tract: Secondary | ICD-10-CM | POA: Diagnosis not present

## 2018-02-10 DIAGNOSIS — N200 Calculus of kidney: Secondary | ICD-10-CM | POA: Diagnosis not present

## 2018-02-11 ENCOUNTER — Encounter: Payer: Medicare Other | Admitting: Surgery

## 2018-02-20 DIAGNOSIS — Q7649 Other congenital malformations of spine, not associated with scoliosis: Secondary | ICD-10-CM | POA: Diagnosis not present

## 2018-02-20 DIAGNOSIS — M5441 Lumbago with sciatica, right side: Secondary | ICD-10-CM | POA: Diagnosis not present

## 2018-02-20 DIAGNOSIS — G8929 Other chronic pain: Secondary | ICD-10-CM | POA: Diagnosis not present

## 2018-02-20 DIAGNOSIS — M5442 Lumbago with sciatica, left side: Secondary | ICD-10-CM | POA: Diagnosis not present

## 2018-02-21 ENCOUNTER — Other Ambulatory Visit: Payer: Self-pay | Admitting: Neurosurgery

## 2018-02-21 DIAGNOSIS — G8929 Other chronic pain: Secondary | ICD-10-CM

## 2018-02-21 DIAGNOSIS — M5442 Lumbago with sciatica, left side: Principal | ICD-10-CM

## 2018-02-21 DIAGNOSIS — M5441 Lumbago with sciatica, right side: Principal | ICD-10-CM

## 2018-02-24 ENCOUNTER — Ambulatory Visit: Payer: Medicare Other | Admitting: Pain Medicine

## 2018-02-25 ENCOUNTER — Ambulatory Visit: Payer: Medicare Other | Admitting: Nurse Practitioner

## 2018-02-28 NOTE — Progress Notes (Signed)
Nursing Pain Medication Assessment:  Safety precautions to be maintained throughout the outpatient stay will include: orient to surroundings, keep bed in low position, maintain call bell within reach at all times, provide assistance with transfer out of bed and ambulation.  Medication Inspection Compliance: Pill count conducted under aseptic conditions, in front of the patient. Neither the pills nor the bottle was removed from the patient's sight at any time. Once count was completed pills were immediately returned to the patient in their original bottle.  Medication: Oxycodone IR Pill/Patch Count: 0 of 120 pills remain Pill/Patch Appearance: Markings consistent with prescribed medication Bottle Appearance: Standard pharmacy container. Clearly labeled. Filled Date: 08 /21 / 2019 Last Medication intake:  Howard Murphy

## 2018-03-05 DIAGNOSIS — G8929 Other chronic pain: Secondary | ICD-10-CM | POA: Diagnosis not present

## 2018-03-05 DIAGNOSIS — M5441 Lumbago with sciatica, right side: Secondary | ICD-10-CM | POA: Diagnosis not present

## 2018-03-05 DIAGNOSIS — M5442 Lumbago with sciatica, left side: Secondary | ICD-10-CM

## 2018-03-06 ENCOUNTER — Ambulatory Visit: Payer: Medicare Other

## 2018-03-12 ENCOUNTER — Ambulatory Visit
Admission: RE | Admit: 2018-03-12 | Discharge: 2018-03-12 | Disposition: A | Discharge status: Home / Self Care | Payer: Medicare Other | Source: Ambulatory Visit | Attending: Neurosurgery | Admitting: Neurosurgery

## 2018-03-12 DIAGNOSIS — M5441 Lumbago with sciatica, right side: Secondary | ICD-10-CM | POA: Insufficient documentation

## 2018-03-12 DIAGNOSIS — M5126 Other intervertebral disc displacement, lumbar region: Secondary | ICD-10-CM | POA: Diagnosis not present

## 2018-03-12 DIAGNOSIS — M5136 Other intervertebral disc degeneration, lumbar region: Secondary | ICD-10-CM | POA: Diagnosis not present

## 2018-03-12 DIAGNOSIS — M5442 Lumbago with sciatica, left side: Secondary | ICD-10-CM | POA: Diagnosis not present

## 2018-03-12 DIAGNOSIS — G8929 Other chronic pain: Secondary | ICD-10-CM | POA: Diagnosis not present

## 2018-03-12 DIAGNOSIS — M48061 Spinal stenosis, lumbar region without neurogenic claudication: Secondary | ICD-10-CM | POA: Insufficient documentation

## 2018-03-12 DIAGNOSIS — M545 Low back pain: Secondary | ICD-10-CM | POA: Diagnosis not present

## 2018-03-13 DIAGNOSIS — M960 Pseudarthrosis after fusion or arthrodesis: Secondary | ICD-10-CM | POA: Diagnosis not present

## 2018-03-13 DIAGNOSIS — M48062 Spinal stenosis, lumbar region with neurogenic claudication: Secondary | ICD-10-CM | POA: Diagnosis not present

## 2018-03-25 ENCOUNTER — Encounter
Admission: RE | Admit: 2018-03-25 | Discharge: 2018-03-25 | Disposition: A | Discharge status: Home / Self Care | Payer: Medicare Other | Source: Ambulatory Visit | Attending: Neurosurgery | Admitting: Neurosurgery

## 2018-03-25 ENCOUNTER — Other Ambulatory Visit: Payer: Self-pay

## 2018-03-25 DIAGNOSIS — I10 Essential (primary) hypertension: Secondary | ICD-10-CM | POA: Diagnosis not present

## 2018-03-25 DIAGNOSIS — Z01818 Encounter for other preprocedural examination: Secondary | ICD-10-CM | POA: Diagnosis not present

## 2018-03-25 DIAGNOSIS — E119 Type 2 diabetes mellitus without complications: Secondary | ICD-10-CM | POA: Insufficient documentation

## 2018-03-25 HISTORY — DX: Personal history of urinary calculi: Z87.442

## 2018-03-25 LAB — BASIC METABOLIC PANEL
Anion gap: 8 (ref 5–15)
BUN: 16 mg/dL (ref 8–23)
CO2: 30 mmol/L (ref 22–32)
Calcium: 9.5 mg/dL (ref 8.9–10.3)
Chloride: 98 mmol/L (ref 98–111)
Creatinine, Ser: 1.04 mg/dL (ref 0.61–1.24)
GFR calc Af Amer: >60 mL/min (ref >60)
GFR calc non Af Amer: >60 mL/min (ref >60)
Glucose, Bld: 113 mg/dL — ABNORMAL HIGH (ref 70–99)
POTASSIUM: 3 mmol/L — AB (ref 3.5–5.1)
SODIUM: 136 mmol/L (ref 135–145)

## 2018-03-25 LAB — CBC
HCT: 48.5 % (ref 39.0–52.0)
HEMOGLOBIN: 16.8 g/dL (ref 13.0–17.0)
MCH: 30.1 pg (ref 26.0–34.0)
MCHC: 34.6 g/dL (ref 30.0–36.0)
MCV: 86.8 fL (ref 80.0–100.0)
Platelets: 252 10*3/uL (ref 150–400)
RBC: 5.59 MIL/uL (ref 4.22–5.81)
RDW: 13 % (ref 11.5–15.5)
WBC: 11.2 10*3/uL — ABNORMAL HIGH (ref 4.0–10.5)
nRBC: 0 % (ref 0.0–0.2)

## 2018-03-25 LAB — URINALYSIS, ROUTINE W REFLEX MICROSCOPIC
Bilirubin Urine: NEGATIVE
GLUCOSE, UA: 50 mg/dL — AB
Hgb urine dipstick: NEGATIVE
Ketones, ur: NEGATIVE mg/dL
LEUKOCYTES UA: NEGATIVE
Nitrite: NEGATIVE
PH: 6 (ref 5.0–8.0)
PROTEIN: NEGATIVE mg/dL
Specific Gravity, Urine: 1.015 (ref 1.005–1.030)

## 2018-03-25 LAB — PROTIME-INR
INR: 1
Prothrombin Time: 13.1 seconds (ref 11.4–15.2)

## 2018-03-25 LAB — SURGICAL PCR SCREEN
MRSA, PCR: NEGATIVE
Staphylococcus aureus: NEGATIVE

## 2018-03-25 LAB — APTT: aPTT: 25 seconds (ref 24–36)

## 2018-03-25 NOTE — Patient Instructions (Signed)
Your procedure is scheduled on: Wednesday 04/02/18.  Report to DAY SURGERY DEPARTMENT LOCATED ON 2ND FLOOR MEDICAL MALL ENTRANCE. To find out your arrival time please call [phone] between 1PM - 3PM on Tuesday 04/01/18.  Remember: Instructions that are not followed completely may result in serious medical risk, up to and including death, or upon the discretion of your surgeon and anesthesiologist your surgery may need to be rescheduled.     _X__ 1. Do not eat food after midnight the night before your procedure.                 No gum chewing or hard candies. You may drink SUGAR FREE clear liquids up to 2 hours                 before you are scheduled to arrive for your surgery- DO NOT drink clear                 liquids within 2 hours of the start of your surgery.                  __X__2.  On the morning of surgery brush your teeth with toothpaste and water, you may rinse your mouth with mouthwash if you wish.  Do not swallow any toothpaste or mouthwash.     _X__ 3.  No Alcohol for 24 hours before or after surgery.   _X__ 4.  Do Not Smoke or use e-cigarettes For 24 Hours Prior to Your Surgery.                 Do not use any chewable tobacco products for at least 6 hours prior to                 surgery.  ____  5.  Bring all medications with you on the day of surgery if instructed.   __X__  6.  Notify your doctor if there is any change in your medical condition      (cold, fever, infections).     Do not wear jewelry, make-up, hairpins, clips or nail polish. Do not wear lotions, powders, or perfumes.  Do not shave 48 hours prior to surgery. Men may shave face and neck. Do not bring valuables to the hospital.    [facility] is not responsible for any belongings or valuables.  Contacts, dentures/partials or body piercings may not be worn into surgery. Bring a case for your contacts, glasses or hearing aids, a denture cup will be supplied. Leave your suitcase in the car. After  surgery it may be brought to your room. For patients admitted to the hospital, discharge time is determined by your treatment team.     Please read over the following fact sheets that you were given:   MRSA Information  __X__ Take these medicines the morning of surgery with A SIP OF WATER:     1. albuterol (PROVENTIL HFA;VENTOLIN HFA) 108 (90 Base) MCG/ACT inhaler  2. gabapentin (NEURONTIN) 300 MG capsule  3. oxyCODONE-acetaminophen (PERCOCET/ROXICET) 5-325 MG tablet : IF YOUR DOCTOR CHANGES THIS PAIN MEDICATION TO A  DIFFERENT ONE, YOU MAY TAKE THE NEW ONE ON MORNING OF SURGERY AS LONG AS IT DOES NOT CONTAIN AN NSAID (BLOOD  THINNER)  4. ranitidine (ZANTAC) 150 MG tablet  5. tamsulosin (FLOMAX) 0.4 MG CAPS capsule  6.    __X__ Use CHG Soap as directed  _ X___ Use inhalers on the day of surgery. Also bring the inhaler  with you to the hospital on the morning of surgery.    __X__ Stop Anti-inflammatories 7 days before surgery (Wednesday 10/16) such as Advil, Ibuprofen, Motrin, BC or Goodies Powder, Naprosyn, Naproxen, Aleve, Aspirin, Meloxicam. May take Tylenol or if needed for pain or discomfort.   __X__ Stop all herbal supplements, fish oil or vitamin E until after surgery.

## 2018-03-25 NOTE — Pre-Procedure Instructions (Addendum)
CBC & BMP were not collected in PAT as the patient had recent results WDL. CBC & CHEM results from Care Everywhere 02/10/18.      CBC w/ DifferentialResulted: 02/10/2018 6:05 PM [facility] Component Name Value Ref Range  WBC 10.1 4.5 - 11 10*9/L  RBC 5.60 4.5 - 5.9 10*12/L  HGB 17.5 13.5 - 17.5 g/dL  HCT 51.0 41 - 53 %  MCV 91.1 80 - 100 fL  MCH 31.2 26 - 34 pg  MCHC 34.2 31 - 37 g/dL  RDW 16.2 (H) 12 - 15 %  MPV 8.2 7 - 10 fL  Platelet 253 150 - 440 10*9/L  Neutrophils % 67.0 %  Lymphocytes % 24.4 %  Monocytes % 4.2 %  Eosinophils % 2.5 %  Basophils % 0.2 %  Absolute Neutrophils 6.8 2 - 7.5 10*9/L  Absolute Lymphocytes 2.5 1.5 - 5 10*9/L  Absolute Monocytes 0.4 0.2 - 0.8 10*9/L  Absolute Eosinophils 0.3 0 - 0.4 10*9/L  Absolute Basophils 0.0 0 - 0.1 10*9/L  Large Unstained Cells 2 0 - 4 %  Anisocytosis Slight (A) Not Present    Comprehensive Metabolic Panel (19/50/9326 5:58 PM EDT) Comprehensive Metabolic Panel (71/24/5809 5:58 PM EDT)  Component Value Ref Range Performed At Pathologist Signature  Sodium 137 135 - 145 mmol/L [facility]   Potassium 4.1 3.5 - 5.0 mmol/L [facility]   Chloride 99 98 - 107 mmol/L [facility]   CO2 25.0 22.0 - 30.0 mmol/L [facility]   BUN 15 7 - 21 mg/dL [facility]   Creatinine 0.87 0.70 - 1.30 mg/dL [facility]   BUN/Creatinine Ratio 17  [facility]   EGFR CKD-EPI Non-African American, Male >90 >=60 mL/min/1.29m [facility]   EGFR CKD-EPI African American, Male >90 >=60 [facility]   Glucose 195 (H) 65 - 179 mg/dL [facility]   Calcium 10.0 8.5 - 10.2 mg/dL [facility]   Albumin 4.4 3.5 - 5.0 g/dL [facility]   Total Protein 7.4 6.5 - 8.3 g/dL [facility]   Total Bilirubin 0.5 0.0 - 1.2  mg/dL [facility]   AST 30 19 - 55 U/L [facility]   ALT 46 19 - 72 U/L [facility]   Alkaline Phosphatase 45 38 - 126 U/L [facility]   Anion Gap 13 9 - 15 mmol/L [facility]

## 2018-03-25 NOTE — Pre-Procedure Instructions (Signed)
KT 3.0 WITH REQUEST FOR SUPPL TO BE STARTED TO DR Izora Ribas

## 2018-03-31 ENCOUNTER — Other Ambulatory Visit: Payer: Self-pay | Admitting: Pain Medicine

## 2018-03-31 DIAGNOSIS — G894 Chronic pain syndrome: Secondary | ICD-10-CM

## 2018-03-31 MED ORDER — OXYCODONE-ACETAMINOPHEN 5-325 MG PO TABS: 1.0000 | ORAL_TABLET | Freq: Four times a day (QID) | ORAL | 0 refills | Status: DC | PRN

## 2018-04-02 ENCOUNTER — Inpatient Hospital Stay: Payer: Medicare Other | Admitting: Certified Registered Nurse Anesthetist

## 2018-04-02 ENCOUNTER — Inpatient Hospital Stay
Admission: RE | Admit: 2018-04-02 | Discharge: 2018-04-06 | DRG: 460 | Disposition: A | Discharge status: Home / Self Care | Payer: Medicare Other | Attending: Neurosurgery | Admitting: Neurosurgery

## 2018-04-02 ENCOUNTER — Other Ambulatory Visit: Payer: Self-pay

## 2018-04-02 ENCOUNTER — Encounter
Admission: RE | Disposition: A | Discharge status: Home / Self Care | Payer: Self-pay | Source: Home / Self Care | Attending: Neurosurgery

## 2018-04-02 ENCOUNTER — Encounter: Payer: Self-pay | Admitting: *Deleted

## 2018-04-02 ENCOUNTER — Inpatient Hospital Stay: Payer: Medicare Other

## 2018-04-02 DIAGNOSIS — Z87891 Personal history of nicotine dependence: Secondary | ICD-10-CM

## 2018-04-02 DIAGNOSIS — M5416 Radiculopathy, lumbar region: Secondary | ICD-10-CM | POA: Diagnosis present

## 2018-04-02 DIAGNOSIS — K219 Gastro-esophageal reflux disease without esophagitis: Secondary | ICD-10-CM | POA: Diagnosis present

## 2018-04-02 DIAGNOSIS — Z419 Encounter for procedure for purposes other than remedying health state, unspecified: Secondary | ICD-10-CM

## 2018-04-02 DIAGNOSIS — Z79899 Other long term (current) drug therapy: Secondary | ICD-10-CM

## 2018-04-02 DIAGNOSIS — Z87442 Personal history of urinary calculi: Secondary | ICD-10-CM

## 2018-04-02 DIAGNOSIS — G9519 Other vascular myelopathies: Secondary | ICD-10-CM | POA: Diagnosis not present

## 2018-04-02 DIAGNOSIS — Z981 Arthrodesis status: Secondary | ICD-10-CM | POA: Diagnosis not present

## 2018-04-02 DIAGNOSIS — M069 Rheumatoid arthritis, unspecified: Secondary | ICD-10-CM | POA: Diagnosis present

## 2018-04-02 DIAGNOSIS — R29818 Other symptoms and signs involving the nervous system: Secondary | ICD-10-CM | POA: Diagnosis present

## 2018-04-02 DIAGNOSIS — Z79891 Long term (current) use of opiate analgesic: Secondary | ICD-10-CM

## 2018-04-02 DIAGNOSIS — E119 Type 2 diabetes mellitus without complications: Secondary | ICD-10-CM | POA: Diagnosis present

## 2018-04-02 DIAGNOSIS — E1140 Type 2 diabetes mellitus with diabetic neuropathy, unspecified: Secondary | ICD-10-CM | POA: Diagnosis not present

## 2018-04-02 DIAGNOSIS — E7800 Pure hypercholesterolemia, unspecified: Secondary | ICD-10-CM | POA: Diagnosis present

## 2018-04-02 DIAGNOSIS — I10 Essential (primary) hypertension: Secondary | ICD-10-CM | POA: Diagnosis present

## 2018-04-02 DIAGNOSIS — M48062 Spinal stenosis, lumbar region with neurogenic claudication: Secondary | ICD-10-CM | POA: Diagnosis present

## 2018-04-02 DIAGNOSIS — Q7649 Other congenital malformations of spine, not associated with scoliosis: Secondary | ICD-10-CM | POA: Diagnosis not present

## 2018-04-02 DIAGNOSIS — M960 Pseudarthrosis after fusion or arthrodesis: Secondary | ICD-10-CM | POA: Diagnosis not present

## 2018-04-02 DIAGNOSIS — E785 Hyperlipidemia, unspecified: Secondary | ICD-10-CM | POA: Diagnosis not present

## 2018-04-02 LAB — POCT I-STAT 4, (NA,K, GLUC, HGB,HCT)
Glucose, Bld: 157 mg/dL — ABNORMAL HIGH (ref 70–99)
HEMATOCRIT: 54 % — AB (ref 39.0–52.0)
Hemoglobin: 18.4 g/dL — ABNORMAL HIGH (ref 13.0–17.0)
Potassium: 3.2 mmol/L — ABNORMAL LOW (ref 3.5–5.1)
Sodium: 140 mmol/L (ref 135–145)

## 2018-04-02 LAB — GLUCOSE, CAPILLARY
Glucose-Capillary: 165 mg/dL — ABNORMAL HIGH (ref 70–99)
Glucose-Capillary: 191 mg/dL — ABNORMAL HIGH (ref 70–99)
Glucose-Capillary: 177 mg/dL — ABNORMAL HIGH (ref 70–99)

## 2018-04-02 SURGERY — POSTERIOR LUMBAR FUSION 1 LEVEL
Anesthesia: General | Site: Back

## 2018-04-02 MED ORDER — ONDANSETRON HCL 4 MG/2ML IJ SOLN: 4.0000 mg | Freq: Once | INTRAMUSCULAR | Status: DC | PRN

## 2018-04-02 MED ORDER — SODIUM CHLORIDE 0.9% FLUSH
3.0000 mL | Freq: Two times a day (BID) | INTRAVENOUS | Status: DC
  Administered 2018-04-03 – 2018-04-05 (×4): 3 mL via INTRAVENOUS

## 2018-04-02 MED ORDER — CEFAZOLIN SODIUM-DEXTROSE 2-4 GM/100ML-% IV SOLN
2.0000 g | Freq: Once | INTRAVENOUS | Status: AC
  Administered 2018-04-02 (×2): 2 g via INTRAVENOUS

## 2018-04-02 MED ORDER — NALOXONE HCL 0.4 MG/ML IJ SOLN: 0.4000 mg | INTRAMUSCULAR | Status: DC | PRN

## 2018-04-02 MED ORDER — SODIUM CHLORIDE 0.9% FLUSH: 9.0000 mL | INTRAVENOUS | Status: DC | PRN

## 2018-04-02 MED ORDER — SUGAMMADEX SODIUM 200 MG/2ML IV SOLN
INTRAVENOUS | Status: AC
  Filled 2018-04-02: qty 2

## 2018-04-02 MED ORDER — MENTHOL 3 MG MT LOZG
1.0000 | LOZENGE | OROMUCOSAL | Status: DC | PRN
  Filled 2018-04-02: qty 9

## 2018-04-02 MED ORDER — HYDROMORPHONE HCL 1 MG/ML IJ SOLN
INTRAMUSCULAR | Status: AC
  Administered 2018-04-02: 0.5 mg via INTRAVENOUS
  Filled 2018-04-02: qty 1

## 2018-04-02 MED ORDER — POTASSIUM CHLORIDE CRYS ER 10 MEQ PO TBCR: 10.0000 meq | EXTENDED_RELEASE_TABLET | Freq: Every day | ORAL | Status: DC | PRN

## 2018-04-02 MED ORDER — HYDROMORPHONE HCL 1 MG/ML IJ SOLN
0.5000 mg | INTRAMUSCULAR | Status: AC | PRN
  Administered 2018-04-02 (×4): 0.5 mg via INTRAVENOUS

## 2018-04-02 MED ORDER — ALBUTEROL SULFATE (2.5 MG/3ML) 0.083% IN NEBU: 2.5000 mg | INHALATION_SOLUTION | Freq: Four times a day (QID) | RESPIRATORY_TRACT | Status: DC | PRN

## 2018-04-02 MED ORDER — SODIUM CHLORIDE 0.9 % IV SOLN
INTRAVENOUS | Status: DC | PRN
  Administered 2018-04-02: 25 ug/min via INTRAVENOUS

## 2018-04-02 MED ORDER — ROCURONIUM BROMIDE 50 MG/5ML IV SOLN
INTRAVENOUS | Status: AC
  Filled 2018-04-02: qty 1

## 2018-04-02 MED ORDER — THROMBIN 5000 UNITS EX SOLR
CUTANEOUS | Status: AC
  Filled 2018-04-02: qty 5000

## 2018-04-02 MED ORDER — ONDANSETRON HCL 4 MG/2ML IJ SOLN
4.0000 mg | Freq: Four times a day (QID) | INTRAMUSCULAR | Status: DC | PRN
  Administered 2018-04-04 – 2018-04-05 (×2): 4 mg via INTRAVENOUS
  Filled 2018-04-02 (×3): qty 2

## 2018-04-02 MED ORDER — SUGAMMADEX SODIUM 200 MG/2ML IV SOLN
INTRAVENOUS | Status: DC | PRN
  Administered 2018-04-02: 200 mg via INTRAVENOUS

## 2018-04-02 MED ORDER — HEPARIN SODIUM (PORCINE) 1000 UNIT/ML IJ SOLN
INTRAMUSCULAR | Status: AC
  Filled 2018-04-02: qty 1

## 2018-04-02 MED ORDER — SCOPOLAMINE 1 MG/3DAYS TD PT72
MEDICATED_PATCH | TRANSDERMAL | Status: AC
  Filled 2018-04-02: qty 1

## 2018-04-02 MED ORDER — PROPOFOL 10 MG/ML IV BOLUS
INTRAVENOUS | Status: AC
  Filled 2018-04-02: qty 20

## 2018-04-02 MED ORDER — BUPIVACAINE-EPINEPHRINE (PF) 0.5% -1:200000 IJ SOLN
INTRAMUSCULAR | Status: AC
  Filled 2018-04-02: qty 30

## 2018-04-02 MED ORDER — ACETAMINOPHEN 650 MG RE SUPP: 650.0000 mg | RECTAL | Status: DC | PRN

## 2018-04-02 MED ORDER — FAMOTIDINE 20 MG PO TABS
ORAL_TABLET | ORAL | Status: AC
  Administered 2018-04-02: 20 mg
  Filled 2018-04-02: qty 1

## 2018-04-02 MED ORDER — METHOCARBAMOL 1000 MG/10ML IJ SOLN
500.0000 mg | Freq: Four times a day (QID) | INTRAVENOUS | Status: DC
  Filled 2018-04-02: qty 5

## 2018-04-02 MED ORDER — LINAGLIPTIN 5 MG PO TABS
5.0000 mg | ORAL_TABLET | Freq: Every day | ORAL | Status: DC
  Administered 2018-04-04 – 2018-04-06 (×3): 5 mg via ORAL
  Filled 2018-04-02 (×4): qty 1

## 2018-04-02 MED ORDER — ACETAMINOPHEN 325 MG PO TABS
650.0000 mg | ORAL_TABLET | ORAL | Status: DC | PRN
  Administered 2018-04-04: 650 mg via ORAL
  Filled 2018-04-02: qty 2

## 2018-04-02 MED ORDER — ROCURONIUM BROMIDE 100 MG/10ML IV SOLN
INTRAVENOUS | Status: DC | PRN
  Administered 2018-04-02 (×2): 20 mg via INTRAVENOUS
  Administered 2018-04-02: 5 mg via INTRAVENOUS
  Administered 2018-04-02: 25 mg via INTRAVENOUS
  Administered 2018-04-02: 20 mg via INTRAVENOUS

## 2018-04-02 MED ORDER — BUPIVACAINE LIPOSOME 1.3 % IJ SUSP
INTRAMUSCULAR | Status: AC
  Filled 2018-04-02: qty 20

## 2018-04-02 MED ORDER — MIDAZOLAM HCL 2 MG/2ML IJ SOLN
INTRAMUSCULAR | Status: AC
  Filled 2018-04-02: qty 2

## 2018-04-02 MED ORDER — PROPOFOL 500 MG/50ML IV EMUL
INTRAVENOUS | Status: AC
  Filled 2018-04-02: qty 50

## 2018-04-02 MED ORDER — SUCCINYLCHOLINE CHLORIDE 20 MG/ML IJ SOLN
INTRAMUSCULAR | Status: AC
  Filled 2018-04-02: qty 1

## 2018-04-02 MED ORDER — SUCCINYLCHOLINE CHLORIDE 20 MG/ML IJ SOLN
INTRAMUSCULAR | Status: DC | PRN
  Administered 2018-04-02: 100 mg via INTRAVENOUS

## 2018-04-02 MED ORDER — VANCOMYCIN HCL 1000 MG IV SOLR
INTRAVENOUS | Status: AC
  Filled 2018-04-02: qty 2000

## 2018-04-02 MED ORDER — INSULIN ASPART 100 UNIT/ML ~~LOC~~ SOLN
0.0000 [IU] | Freq: Three times a day (TID) | SUBCUTANEOUS | Status: DC
  Administered 2018-04-03: 2 [IU] via SUBCUTANEOUS
  Administered 2018-04-04: 3 [IU] via SUBCUTANEOUS
  Administered 2018-04-04 – 2018-04-06 (×5): 2 [IU] via SUBCUTANEOUS
  Filled 2018-04-02 (×7): qty 1

## 2018-04-02 MED ORDER — REMIFENTANIL HCL 1 MG IV SOLR
INTRAVENOUS | Status: AC
  Filled 2018-04-02: qty 1000

## 2018-04-02 MED ORDER — DIPHENHYDRAMINE HCL 50 MG/ML IJ SOLN: 12.5000 mg | Freq: Four times a day (QID) | INTRAMUSCULAR | Status: DC | PRN

## 2018-04-02 MED ORDER — FENTANYL CITRATE (PF) 100 MCG/2ML IJ SOLN
25.0000 ug | INTRAMUSCULAR | Status: AC | PRN
  Administered 2018-04-02 (×6): 25 ug via INTRAVENOUS

## 2018-04-02 MED ORDER — KETAMINE HCL 50 MG/ML IJ SOLN
INTRAMUSCULAR | Status: DC | PRN
  Administered 2018-04-02: 50 mg via INTRAMUSCULAR

## 2018-04-02 MED ORDER — VANCOMYCIN HCL IN DEXTROSE 1-5 GM/200ML-% IV SOLN
INTRAVENOUS | Status: AC
  Filled 2018-04-02: qty 200

## 2018-04-02 MED ORDER — BUPIVACAINE-EPINEPHRINE 0.5% -1:200000 IJ SOLN
INTRAMUSCULAR | Status: DC | PRN
  Administered 2018-04-02: 10 mL

## 2018-04-02 MED ORDER — LIDOCAINE HCL (PF) 2 % IJ SOLN
INTRAMUSCULAR | Status: AC
  Filled 2018-04-02: qty 10

## 2018-04-02 MED ORDER — DEXAMETHASONE SODIUM PHOSPHATE 10 MG/ML IJ SOLN
INTRAMUSCULAR | Status: DC | PRN
  Administered 2018-04-02: 5 mg via INTRAVENOUS

## 2018-04-02 MED ORDER — GABAPENTIN 300 MG PO CAPS: 300.0000 mg | ORAL_CAPSULE | Freq: Every day | ORAL | Status: DC | PRN

## 2018-04-02 MED ORDER — METHOCARBAMOL 500 MG PO TABS
750.0000 mg | ORAL_TABLET | Freq: Four times a day (QID) | ORAL | Status: DC
  Administered 2018-04-02 – 2018-04-04 (×7): 750 mg via ORAL
  Filled 2018-04-02 (×3): qty 2
  Filled 2018-04-02: qty 1.5
  Filled 2018-04-02 (×3): qty 2

## 2018-04-02 MED ORDER — TAMSULOSIN HCL 0.4 MG PO CAPS
0.4000 mg | ORAL_CAPSULE | Freq: Every day | ORAL | Status: DC
  Administered 2018-04-03 – 2018-04-05 (×3): 0.4 mg via ORAL
  Filled 2018-04-02 (×2): qty 1

## 2018-04-02 MED ORDER — ONDANSETRON HCL 4 MG PO TABS
4.0000 mg | ORAL_TABLET | Freq: Four times a day (QID) | ORAL | Status: DC | PRN
  Administered 2018-04-05: 4 mg via ORAL
  Filled 2018-04-02: qty 1

## 2018-04-02 MED ORDER — ATORVASTATIN CALCIUM 20 MG PO TABS
40.0000 mg | ORAL_TABLET | Freq: Every day | ORAL | Status: DC
  Administered 2018-04-02 – 2018-04-05 (×4): 40 mg via ORAL
  Filled 2018-04-02 (×5): qty 2

## 2018-04-02 MED ORDER — SODIUM CHLORIDE 0.9 % IR SOLN
Status: DC | PRN
  Administered 2018-04-02: 1000 mL

## 2018-04-02 MED ORDER — HEPARIN SODIUM (PORCINE) 10000 UNIT/ML IJ SOLN
INTRAMUSCULAR | Status: AC
  Filled 2018-04-02: qty 1

## 2018-04-02 MED ORDER — OXYCODONE-ACETAMINOPHEN 5-325 MG PO TABS
1.0000 | ORAL_TABLET | Freq: Four times a day (QID) | ORAL | Status: DC | PRN
  Administered 2018-04-02 – 2018-04-05 (×7): 1 via ORAL
  Filled 2018-04-02 (×7): qty 1

## 2018-04-02 MED ORDER — DIPHENHYDRAMINE HCL 12.5 MG/5ML PO ELIX: 12.5000 mg | ORAL_SOLUTION | Freq: Four times a day (QID) | ORAL | Status: DC | PRN

## 2018-04-02 MED ORDER — SODIUM CHLORIDE 0.9 % IV SOLN
INTRAVENOUS | Status: DC | PRN
  Administered 2018-04-02 (×4): via INTRAVENOUS

## 2018-04-02 MED ORDER — PROPOFOL 10 MG/ML IV BOLUS
INTRAVENOUS | Status: DC | PRN
  Administered 2018-04-02: 130 mg via INTRAVENOUS

## 2018-04-02 MED ORDER — DEXAMETHASONE SODIUM PHOSPHATE 10 MG/ML IJ SOLN
INTRAMUSCULAR | Status: AC
  Filled 2018-04-02: qty 1

## 2018-04-02 MED ORDER — SODIUM CHLORIDE 0.9 % IV SOLN
INTRAVENOUS | Status: DC | PRN
  Administered 2018-04-02: .2 ug/kg/min via INTRAVENOUS

## 2018-04-02 MED ORDER — METHYLPREDNISOLONE ACETATE 40 MG/ML IJ SUSP
INTRAMUSCULAR | Status: AC
  Filled 2018-04-02: qty 1

## 2018-04-02 MED ORDER — ACETAMINOPHEN 500 MG PO TABS
1000.0000 mg | ORAL_TABLET | Freq: Four times a day (QID) | ORAL | Status: AC
  Administered 2018-04-03 (×2): 1000 mg via ORAL
  Filled 2018-04-02 (×3): qty 2

## 2018-04-02 MED ORDER — PROPOFOL 500 MG/50ML IV EMUL
INTRAVENOUS | Status: DC | PRN
  Administered 2018-04-02: 150 ug/kg/min via INTRAVENOUS

## 2018-04-02 MED ORDER — BUPIVACAINE HCL (PF) 0.5 % IJ SOLN
INTRAMUSCULAR | Status: AC
  Filled 2018-04-02: qty 30

## 2018-04-02 MED ORDER — SODIUM CHLORIDE 0.9% FLUSH: 3.0000 mL | INTRAVENOUS | Status: DC | PRN

## 2018-04-02 MED ORDER — THROMBIN 5000 UNITS EX SOLR
CUTANEOUS | Status: DC | PRN
  Administered 2018-04-02: 5000 [IU] via TOPICAL

## 2018-04-02 MED ORDER — ONDANSETRON HCL 4 MG/2ML IJ SOLN
4.0000 mg | Freq: Four times a day (QID) | INTRAMUSCULAR | Status: DC | PRN
  Administered 2018-04-02: 4 mg via INTRAVENOUS

## 2018-04-02 MED ORDER — ONDANSETRON HCL 4 MG/2ML IJ SOLN
INTRAMUSCULAR | Status: DC | PRN
  Administered 2018-04-02: 4 mg via INTRAVENOUS

## 2018-04-02 MED ORDER — FENTANYL CITRATE (PF) 100 MCG/2ML IJ SOLN
INTRAMUSCULAR | Status: AC
  Administered 2018-04-02: 25 ug via INTRAVENOUS
  Filled 2018-04-02: qty 2

## 2018-04-02 MED ORDER — LACTATED RINGERS IV SOLN
INTRAVENOUS | Status: DC | PRN
  Administered 2018-04-02: 19:00:00 via INTRAVENOUS

## 2018-04-02 MED ORDER — SODIUM CHLORIDE 0.9 % IV SOLN
INTRAVENOUS | Status: DC
  Administered 2018-04-02 – 2018-04-05 (×4): via INTRAVENOUS

## 2018-04-02 MED ORDER — VANCOMYCIN HCL IN DEXTROSE 1-5 GM/200ML-% IV SOLN
1000.0000 mg | Freq: Once | INTRAVENOUS | Status: AC
  Administered 2018-04-02: 1000 mg via INTRAVENOUS

## 2018-04-02 MED ORDER — FENTANYL CITRATE (PF) 100 MCG/2ML IJ SOLN
INTRAMUSCULAR | Status: AC
  Filled 2018-04-02: qty 2

## 2018-04-02 MED ORDER — SODIUM CHLORIDE FLUSH 0.9 % IV SOLN
INTRAVENOUS | Status: AC
  Filled 2018-04-02: qty 10

## 2018-04-02 MED ORDER — PROPOFOL 500 MG/50ML IV EMUL
INTRAVENOUS | Status: AC
  Filled 2018-04-02: qty 150

## 2018-04-02 MED ORDER — FENTANYL CITRATE (PF) 100 MCG/2ML IJ SOLN
50.0000 ug | Freq: Once | INTRAMUSCULAR | Status: AC
  Administered 2018-04-02: 50 ug via INTRAVENOUS

## 2018-04-02 MED ORDER — VANCOMYCIN HCL 1000 MG IV SOLR
INTRAVENOUS | Status: DC | PRN
  Administered 2018-04-02: 2 g

## 2018-04-02 MED ORDER — PHENOL 1.4 % MT LIQD
1.0000 | OROMUCOSAL | Status: DC | PRN
  Filled 2018-04-02: qty 177

## 2018-04-02 MED ORDER — ACETAMINOPHEN 10 MG/ML IV SOLN
INTRAVENOUS | Status: AC
  Filled 2018-04-02: qty 100

## 2018-04-02 MED ORDER — BACITRACIN 50000 UNITS IM SOLR
INTRAMUSCULAR | Status: AC
  Filled 2018-04-02: qty 1

## 2018-04-02 MED ORDER — MECLIZINE HCL 25 MG PO TABS
25.0000 mg | ORAL_TABLET | Freq: Four times a day (QID) | ORAL | Status: DC | PRN
  Filled 2018-04-02: qty 1

## 2018-04-02 MED ORDER — FENTANYL CITRATE (PF) 100 MCG/2ML IJ SOLN
INTRAMUSCULAR | Status: DC | PRN
  Administered 2018-04-02 (×6): 50 ug via INTRAVENOUS

## 2018-04-02 MED ORDER — LISINOPRIL 20 MG PO TABS
20.0000 mg | ORAL_TABLET | Freq: Every day | ORAL | Status: DC
  Administered 2018-04-04: 20 mg via ORAL
  Filled 2018-04-02: qty 1

## 2018-04-02 MED ORDER — LIDOCAINE HCL (CARDIAC) PF 100 MG/5ML IV SOSY
PREFILLED_SYRINGE | INTRAVENOUS | Status: DC | PRN
  Administered 2018-04-02: 80 mg via INTRAVENOUS

## 2018-04-02 MED ORDER — SCOPOLAMINE 1 MG/3DAYS TD PT72
1.0000 | MEDICATED_PATCH | TRANSDERMAL | Status: DC
  Administered 2018-04-02 – 2018-04-05 (×2): 1.5 mg via TRANSDERMAL
  Filled 2018-04-02: qty 1

## 2018-04-02 MED ORDER — CEFAZOLIN SODIUM-DEXTROSE 2-4 GM/100ML-% IV SOLN
INTRAVENOUS | Status: AC
  Filled 2018-04-02: qty 100

## 2018-04-02 MED ORDER — REMIFENTANIL HCL 1 MG IV SOLR
INTRAVENOUS | Status: AC
  Filled 2018-04-02: qty 2000

## 2018-04-02 MED ORDER — CHLORTHALIDONE 25 MG PO TABS
50.0000 mg | ORAL_TABLET | Freq: Every day | ORAL | Status: DC
  Administered 2018-04-04 – 2018-04-06 (×2): 50 mg via ORAL
  Filled 2018-04-02 (×4): qty 2

## 2018-04-02 MED ORDER — FAMOTIDINE 20 MG PO TABS: 10.0000 mg | ORAL_TABLET | Freq: Every day | ORAL | Status: DC | PRN

## 2018-04-02 MED ORDER — SENNOSIDES-DOCUSATE SODIUM 8.6-50 MG PO TABS: 1.0000 | ORAL_TABLET | Freq: Every evening | ORAL | Status: DC | PRN

## 2018-04-02 MED ORDER — FAMOTIDINE 20 MG PO TABS
20.0000 mg | ORAL_TABLET | Freq: Once | ORAL | Status: AC
  Administered 2018-04-04: 20 mg via ORAL
  Filled 2018-04-02: qty 1

## 2018-04-02 MED ORDER — ACETAMINOPHEN 10 MG/ML IV SOLN
1000.0000 mg | Freq: Once | INTRAVENOUS | Status: AC
  Administered 2018-04-02: 1000 mg via INTRAVENOUS

## 2018-04-02 MED ORDER — SODIUM CHLORIDE 0.9 % IV SOLN
INTRAVENOUS | Status: DC
  Administered 2018-04-02: 11:00:00 via INTRAVENOUS

## 2018-04-02 MED ORDER — MIDAZOLAM HCL 2 MG/2ML IJ SOLN
INTRAMUSCULAR | Status: DC | PRN
  Administered 2018-04-02: 2 mg via INTRAVENOUS

## 2018-04-02 MED ORDER — HYDROMORPHONE 1 MG/ML IV SOLN
INTRAVENOUS | Status: DC
  Administered 2018-04-02: 25 mg via INTRAVENOUS
  Administered 2018-04-03: 0.3 mg via INTRAVENOUS
  Administered 2018-04-03: 1.5 mg via INTRAVENOUS
  Administered 2018-04-04: 2.1 mg via INTRAVENOUS
  Administered 2018-04-04: 0.3 mg via INTRAVENOUS
  Administered 2018-04-04: 3.9 mg via INTRAVENOUS
  Filled 2018-04-02: qty 25

## 2018-04-02 MED ORDER — SODIUM CHLORIDE 0.9 % IV SOLN: 250.0000 mL | INTRAVENOUS | Status: DC

## 2018-04-02 MED ORDER — PHENYLEPHRINE HCL 10 MG/ML IJ SOLN
INTRAMUSCULAR | Status: DC | PRN
  Administered 2018-04-02: 25 ug via INTRAVENOUS
  Administered 2018-04-02: 50 ug via INTRAVENOUS
  Administered 2018-04-02 (×2): 100 ug via INTRAVENOUS

## 2018-04-02 MED ORDER — ONDANSETRON HCL 4 MG/2ML IJ SOLN
INTRAMUSCULAR | Status: AC
  Filled 2018-04-02: qty 2

## 2018-04-02 SURGICAL SUPPLY — 90 items
BONE CANC CHIPS 20CC PCAN1/4 (Bone Implant) ×6 IMPLANT
BONE CANC CHIPS 40CC CAN1/2 (Bone Implant) ×3 IMPLANT
BUR NEURO DRILL SOFT 3.0X3.8M (BURR) ×3 IMPLANT
CANISTER SUCT 1200ML W/VALVE (MISCELLANEOUS) ×6 IMPLANT
CHIPS CANC BONE 20CC PCAN1/4 (Bone Implant) ×2 IMPLANT
CHIPS CANC BONE 40CC CAN1/2 (Bone Implant) ×1 IMPLANT
CHLORAPREP W/TINT 26ML (MISCELLANEOUS) ×12 IMPLANT
CONNECTOR RELINE 20MM OPEN OFF (Connector) ×9 IMPLANT
CONNECTOR RELINE 30MM OPEN OFF (Connector) ×3 IMPLANT
CORD BIP STRL DISP 12FT (MISCELLANEOUS) ×3 IMPLANT
COUNTER NEEDLE 20/40 LG (NEEDLE) ×3 IMPLANT
COVER LIGHT HANDLE STERIS (MISCELLANEOUS) ×12 IMPLANT
COVER WAND RF STERILE (DRAPES) ×3 IMPLANT
CRADLE LAMINECT ARM (MISCELLANEOUS) ×6 IMPLANT
CUP MEDICINE 2OZ PLAST GRAD ST (MISCELLANEOUS) ×3 IMPLANT
DERMABOND ADVANCED (GAUZE/BANDAGES/DRESSINGS) ×4
DERMABOND ADVANCED .7 DNX12 (GAUZE/BANDAGES/DRESSINGS) ×2 IMPLANT
DRAPE C-ARM 42X72 X-RAY (DRAPES) ×6 IMPLANT
DRAPE C-ARMOR (DRAPES) ×6 IMPLANT
DRAPE INCISE IOBAN 66X45 STRL (DRAPES) ×6 IMPLANT
DRAPE LAPAROTOMY 100X77 ABD (DRAPES) ×6 IMPLANT
DRAPE MICROSCOPE SPINE 48X150 (DRAPES) ×3 IMPLANT
DRAPE POUCH INSTRU U-SHP 10X18 (DRAPES) ×3 IMPLANT
DRAPE SURG 17X11 SM STRL (DRAPES) ×24 IMPLANT
DRAPE TABLE BACK 80X90 (DRAPES) ×3 IMPLANT
DRSG OPSITE POSTOP 4X6 (GAUZE/BANDAGES/DRESSINGS) IMPLANT
DRSG TEGADERM 4X10 (GAUZE/BANDAGES/DRESSINGS) ×9 IMPLANT
DRSG TELFA 3X8 NADH (GAUZE/BANDAGES/DRESSINGS) ×9 IMPLANT
ELECT CAUTERY BLADE TIP 2.5 (TIP) ×6
ELECT EZSTD 165MM 6.5IN (MISCELLANEOUS) ×3
ELECT REM PT RETURN 9FT ADLT (ELECTROSURGICAL) ×6
ELECTRODE CAUTERY BLDE TIP 2.5 (TIP) ×2 IMPLANT
ELECTRODE EZSTD 165MM 6.5IN (MISCELLANEOUS) ×1 IMPLANT
ELECTRODE REM PT RTRN 9FT ADLT (ELECTROSURGICAL) ×2 IMPLANT
FEE INTRAOP MONITOR IMPULS NCS (MISCELLANEOUS) IMPLANT
FRAME EYE SHIELD (PROTECTIVE WEAR) ×6 IMPLANT
GAUZE PETRO XEROFOAM 1X8 (MISCELLANEOUS) ×6 IMPLANT
GLOVE BIO SURGEON STRL SZ 6.5 (GLOVE) ×18 IMPLANT
GLOVE BIO SURGEONS STRL SZ 6.5 (GLOVE) ×9
GLOVE BIOGEL PI IND STRL 7.0 (GLOVE) ×8 IMPLANT
GLOVE BIOGEL PI INDICATOR 7.0 (GLOVE) ×16
GLOVE SURG SYN 8.5  E (GLOVE) ×12
GLOVE SURG SYN 8.5 E (GLOVE) ×6 IMPLANT
GOWN SRG XL LVL 3 NONREINFORCE (GOWNS) ×3 IMPLANT
GOWN STRL NON-REIN TWL XL LVL3 (GOWNS) ×6
GOWN STRL REUS W/TWL MED LVL3 (GOWN DISPOSABLE) ×6 IMPLANT
GRADUATE 1200CC STRL 31836 (MISCELLANEOUS) ×3 IMPLANT
HEMOVAC 400CC 10FR (MISCELLANEOUS) ×6 IMPLANT
INTRAOP MONITOR FEE IMPULS NCS (MISCELLANEOUS)
INTRAOP MONITOR FEE IMPULSE (MISCELLANEOUS)
KIT ASP BONE MRW 120CC (KITS) ×6 IMPLANT
KIT DILATOR XLIF 5 (KITS) ×2 IMPLANT
KIT NEEDLE NVM5 EMG ELECT (KITS) ×2 IMPLANT
KIT NEEDLE NVM5 EMG ELECTRODE (KITS) ×1
KIT SPINAL PRONEVIEW (KITS) ×3 IMPLANT
KIT SURGICAL ACCESS MAXCESS 4 (KITS) ×3 IMPLANT
KIT TURNOVER KIT A (KITS) ×3 IMPLANT
KIT XLIF (KITS) ×1
KNIFE BAYONET SHORT DISCETOMY (MISCELLANEOUS) IMPLANT
MARKER SKIN DUAL TIP RULER LAB (MISCELLANEOUS) ×9 IMPLANT
MODULUS XLW 12X22X55MM 10 (Spine Construct) ×3 IMPLANT
NDL SAFETY ECLIPSE 18X1.5 (NEEDLE) ×1 IMPLANT
NEEDLE HYPO 18GX1.5 SHARP (NEEDLE) ×2
NEEDLE HYPO 22GX1.5 SAFETY (NEEDLE) ×3 IMPLANT
PACK LAMINECTOMY NEURO (CUSTOM PROCEDURE TRAY) ×3 IMPLANT
PAD ARMBOARD 7.5X6 YLW CONV (MISCELLANEOUS) ×3 IMPLANT
PENCIL ELECTRO HAND CTR (MISCELLANEOUS) ×3 IMPLANT
PUTTY DBM PROPEL LRG (Putty) ×4 IMPLANT
PUTTY PROPEL LRG (Putty) ×2 IMPLANT
ROD RELINE 5.5X500MM STRAIGHT (Rod) ×3 IMPLANT
SCREW LOCK RELINE 5.5 TULIP (Screw) ×45 IMPLANT
SCREW RELINE 6.5X35 POLYAXIAL (Screw) ×3 IMPLANT
SCREW RELINE-O POLY 6.5X40 (Screw) ×18 IMPLANT
SCREW RELINE-O POLY 7.5X40 (Screw) ×6 IMPLANT
SCREW SPINAL 8.5X100 RELINE (Screw) ×3 IMPLANT
SCREW SPINAL 8.5X80 RELINE (Screw) ×3 IMPLANT
SPOGE SURGIFLO 8M (HEMOSTASIS) ×2
SPONGE DRAIN TRACH 4X4 STRL 2S (GAUZE/BANDAGES/DRESSINGS) ×9 IMPLANT
SPONGE SURGIFLO 8M (HEMOSTASIS) ×1 IMPLANT
SUT DVC VLOC 3-0 CL 6 P-12 (SUTURE) ×3 IMPLANT
SUT ETHILON 2 0 FSLX (SUTURE) ×6 IMPLANT
SUT VIC AB 0 CT1 27 (SUTURE) ×4
SUT VIC AB 0 CT1 27XCR 8 STRN (SUTURE) ×2 IMPLANT
SUT VIC AB 2-0 CT1 18 (SUTURE) ×6 IMPLANT
SYR 30ML LL (SYRINGE) ×6 IMPLANT
TOWEL OR 17X26 4PK STRL BLUE (TOWEL DISPOSABLE) ×9 IMPLANT
TRAY FOLEY MTR SLVR 16FR STAT (SET/KITS/TRAYS/PACK) IMPLANT
TUBING CONNECTING 10 (TUBING) ×6 IMPLANT
TUBING CONNECTING 10' (TUBING) ×3
bmac2-120-01 procedure pack ×3 IMPLANT

## 2018-04-02 NOTE — Anesthesia Postprocedure Evaluation (Signed)
Anesthesia Post Note  Patient: Howard Murphy  Procedure(s) Performed: POSTERIOR LUMBAR FUSION L2-S2, L2-3 XLIF (N/A Back)  Patient location during evaluation: PACU Anesthesia Type: General Level of consciousness: awake and alert Pain management: pain level controlled Vital Signs Assessment: post-procedure vital signs reviewed and stable Respiratory status: spontaneous breathing, nonlabored ventilation, respiratory function stable and patient connected to nasal cannula oxygen Cardiovascular status: blood pressure returned to baseline and stable Postop Assessment: no apparent nausea or vomiting Anesthetic complications: no     Last Vitals:  Vitals:   04/02/18 2227 04/02/18 2304  BP: 103/69 114/72  Pulse: (!) 101 93  Resp: 20 20  Temp: 36.4 C 36.6 C  SpO2: 100% 98%    Last Pain:  Vitals:   04/02/18 2258  TempSrc:   PainSc: 10-Worst pain ever                 Molli Barrows

## 2018-04-02 NOTE — Transfer of Care (Signed)
Immediate Anesthesia Transfer of Care Note  Patient: Howard Murphy  Procedure(s) Performed: POSTERIOR LUMBAR FUSION L2-S2, L2-3 XLIF (N/A Back)  Patient Location: PACU  Anesthesia Type:General  Level of Consciousness: awake, alert , oriented and patient cooperative  Airway & Oxygen Therapy: Patient Spontanous Breathing and Patient connected to face mask oxygen  Post-op Assessment: Report given to RN and Post -op Vital signs reviewed and stable  Post vital signs: Reviewed and stable  Last Vitals:  Vitals Value Taken Time  BP    Temp    Pulse 101 04/02/2018  8:46 PM  Resp    SpO2 96 % 04/02/2018  8:46 PM  Vitals shown include unvalidated device data.  Last Pain:  Vitals:   04/02/18 1017  TempSrc: Oral  PainSc: 7          Complications: No apparent anesthesia complications

## 2018-04-02 NOTE — Anesthesia Procedure Notes (Signed)
Procedure Name: Intubation Date/Time: 04/02/2018 2:59 PM Performed by: Lia Foyer, RN Pre-anesthesia Checklist: Patient identified, Emergency Drugs available, Suction available, Patient being monitored and Timeout performed Patient Re-evaluated:Patient Re-evaluated prior to induction Oxygen Delivery Method: Circle system utilized Preoxygenation: Pre-oxygenation with 100% oxygen Induction Type: IV induction Ventilation: Mask ventilation without difficulty Laryngoscope Size: McGraph and 4 Grade View: Grade I Tube type: Oral Tube size: 7.5 mm Number of attempts: 1 Airway Equipment and Method: Stylet,  LTA kit utilized and Video-laryngoscopy Placement Confirmation: positive ETCO2 and breath sounds checked- equal and bilateral Secured at: 23 cm Tube secured with: Tape Dental Injury: Teeth and Oropharynx as per pre-operative assessment

## 2018-04-02 NOTE — Anesthesia Preprocedure Evaluation (Signed)
Anesthesia Evaluation  Patient identified by MRN, date of birth, ID band Patient awake    Reviewed: Allergy & Precautions, NPO status , Patient's Chart, lab work & pertinent test results  History of Anesthesia Complications Negative for: history of anesthetic complications  Airway Mallampati: III       Dental   Pulmonary neg sleep apnea, neg COPD,           Cardiovascular hypertension, Pt. on medications (-) Past MI and (-) CHF (-) dysrhythmias (-) Valvular Problems/Murmurs     Neuro/Psych neg Seizures    GI/Hepatic Neg liver ROS, GERD  Medicated and Controlled,  Endo/Other  diabetes, Type 2, Oral Hypoglycemic Agents  Renal/GU Renal disease (stones)     Musculoskeletal   Abdominal   Peds  Hematology   Anesthesia Other Findings   Reproductive/Obstetrics                             Anesthesia Physical Anesthesia Plan  ASA: III  Anesthesia Plan: General   Post-op Pain Management:    Induction: Intravenous  PONV Risk Score and Plan: 2 and Dexamethasone and Ondansetron  Airway Management Planned: Oral ETT  Additional Equipment:   Intra-op Plan:   Post-operative Plan:   Informed Consent: I have reviewed the patients History and Physical, chart, labs and discussed the procedure including the risks, benefits and alternatives for the proposed anesthesia with the patient or authorized representative who has indicated his/her understanding and acceptance.     Plan Discussed with:   Anesthesia Plan Comments:         Anesthesia Quick Evaluation

## 2018-04-02 NOTE — Progress Notes (Signed)
I-stat K+ resulted at 3.2 today, Dr. Ronelle Nigh aware; no new orders.

## 2018-04-02 NOTE — H&P (Signed)
I have reviewed and confirmed my history and physical from 03/13/2018 with no additions or changes. Plan for XLIF L2-3 and revision posterior fusion L2-S2.  Risks and benefits reviewed.    Heart sounds normal no MRG. Chest Clear to Auscultation Bilaterally.

## 2018-04-02 NOTE — Anesthesia Post-op Follow-up Note (Signed)
Anesthesia QCDR form completed.        

## 2018-04-02 NOTE — Op Note (Signed)
Indications: 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  He failed conservative management and elected for surgical intervention.  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  Preoperative Diagnosis: neurogenic claudication, pseudoarthrosis Postoperative Diagnosis: same   EBL: 650 ml IVF: 4000 ml Drains: 2 subfascial in posterior incision Disposition: Extubated and Stable to PACU Complications: none  No foley catheter was placed.   Preoperative Note:   Risks of surgery discussed include: infection, bleeding, stroke, coma, death, paralysis, CSF leak, nerve/spinal cord injury, numbness, tingling, weakness, complex regional pain syndrome, recurrent stenosis and/or disc herniation, vascular injury, development of instability, neck/back pain, need for further surgery, persistent symptoms, development of deformity, and the risks of anesthesia. The patient understood these risks and agreed to proceed.  NAME OF ANTERIOR PROCEDURE:               1. Anterior lumbar interbody fusion via a left lateral retroperitoneal approach at L2/3 2. Placement of a Lordotic Modulus  12x22x55 interbody cage, filled with Demineralized Bone Matrix   NAME OF POSTERIOR PROCEDURE: 1. Posterior instrumentation using open Nuvasive Reline Instrumentation 2. Posterolateral fusion, L2-S2      PROCEDURE:  Patient was brought to the operating room, intubated, turned to the lateral position.  All pressure points were checked and double-checked.  The patient was prepped and draped in the standard fashion. Prior to prepping, fluoroscopy was brought in and the patient was positioned with a large bump under the contralateral side between the iliac crest and rib cage, allowing the area between the iliac crest and the lateral aspect of the rib cage to open and increase the ability to reach inferiorly, to facilitate  entry into the disc space.  The incision was marked upon the skin both the location of the disc space as well as the superior most aspect of the iliac crest.  Based on the identification of the disc space an incision was prepared, marked upon the skin and eventually was used for our lateral incision.  The fluoroscopy was turned into a cross table A/P image in order to confirm that the patient's spine remained in a perpendicular trajectory to the floor without rotation.  Once confirming that all the pressure points were checked and double-checked and the patient remained in sturdy position strapped down in this slightly jack-knifed lateral position, the patient was prepped and draped in standard fashion.  The skin was injected with local anesthetic, then incised until the abdominal wall fascia was noted.  I bluntly dissected posteriorly until we were able to identify the posterior musculature near petit's triangle.  At this point, using primarily blunt dissection with our finger aided with a metzenbaum scissor, were able to enter the retroperitoneal cavity.  The retroperitoneal potential space was opened further until palpating out the psoas muscle, the medial aspect of the iliac crest, the medial aspect of the last rib and continued to define the retroperitoneal space with blunt dissection in order to facilitate safe placement of our dilators.    While protecting by dissecting directly onto a finger in the retroperitoneum, the retroperitoneal space was entered safely from the lateral incision and the initial dilator placed onto the muscle belly of the psoas.  While directly stimulating the dilator and after radiographically confirming our location relative to the disc space, I placed the dilator through the psoas.  The dilators were stimulated to ensure remaining safely away from any  of the lumbar plexus nerves; the dilators were repositioned until no pathologic stimulation was appreciated.  Once I had confirmed  the location of our initial dilator radiographically, a K-wire secured the dilator into the L2/3 disc space and confirmed position under A/P and lateral fluoroscopy.  At this point, I dilated up with direct stimulation to confirm lack of pathologic stimulation.  Once all the dilators were in position, I placed in the retractor and secured it onto the table, locked into position and confirmed under A/P and lateral fluoroscopy to confirm our approach angle to the disc space as well as location relative to the disc space.  I then placed the muscle stimulator in through the working channel down to the vertebral body, stimulating the entire lateral surface of the vertebral body and any of the visualized psoas muscle that was adjacent to the retractor, confirming again the safe passage to the psoas before we began performing the discectomy.  At this point, we began our discectomy at L2/3.  The disc was incised laterally throughout the extent of our exposure. Using a combination of pituitary rongeurs, Kerrison rongeurs, rasps, curettes of various sorts, we were able to begin to clean out the disc space.  Once we had cleaned out the majority of the disc space, we then cut the lateral annulus with a cob, breaking the lateral annual attachments on the contralateral side by subtly working the cob through the annulus while using flouroscopy.  Care was taken not to extend further than required after cutting the annular attachments.  After this had been performed, we prepared the endplates for placement of our graft, sized a graft to the disc space by serially dilating up in trial sizes until we confirmed that our graft would be well positioned, allowing distraction while maintaining good grip.  This was confirmed under A/P and lateral fluoroscopy in order to ensure its placement as an eventual trial for placement of our final graft.  We irrigated with bacteriostatic saline.  Once confirmed placement, the Modulus implant  filled with allograft was impacted into position at L2/3.   Through a combination of intradiscal distraction and anterior releasing, we were able to correct the anterior deformity during disc preparation and placement of the graft.          At this point, final radiographs were performed, and we began closure.  The wound was closed using 0 Vicryl interrupted suture in the fascia and 2-0 Vicryl inverted suture were placed in the subcutaneous tissue and dermis. 3-0 monocryl was used for final closure. Dermabond was used to close the skin.      The patient was then repositioned for the posterior instrumentation. All pressure points were checked and double checked. Flouroscopy was used to mark the incision. The patient was prepped and draped in the standard fashion. A full timeout was performed. Preoperative antibiotics were given. The incision was injected with local anesthetic.  The incision was opened with a scalpel, then the soft tissues divided with the Bovie. Self-retaining retractors were placed. The prior implants were identified and exposed. The paraspinus muscles were reflected laterally in subperiosteal fashion until the transverse processes were visible from L2-the sacral ala. Flouroscopy was used to confirm our localization.   The self-retaining retractors were repositioned. We then placed pedicle screws.  At L2 on one side, a starting point was chosen based on anatomic landmarks, then breached with a high speed drill. A pedicle finder probe was used to cannulate the pedicle, then the balltip probe  used to confirm lack of breach. The tract was tapped, re-checked with the balltip probe, then a 6.5 x 40 mm pedicle screw was placed. The procedure was then repeated contralaterally and the same size screw placed.  At S1 on one side, a starting point was chosen based on anatomic landmarks, then breached with a high speed drill. A pedicle finder probe was used to cannulate the pedicle, then the  balltip probe used to confirm lack of breach. The tract was tapped, re-checked with the balltip probe, then a 7.5 x 40 mm pedicle screw was placed. The procedure was then repeated contralaterally and the same size screw placed.  At L3, L4, and L5, the prior cortical tracts were used and implants placed down the same tract.  6.5x40 were placed at L3 bilaterally, right L5, and left L4, and 6.5x58mm was placed left L5.  At this point, we moved to S2 alariliac fixation.  The start points were identified and breached using the high speed drill.  First on the left, the pedicle finder probe was used to cannulate the SI joint into the ilium, confirming with a teardrop view on the flouroscope.  This was felt with a balltip probe, then a 8.5x46mm screw was placed on the left.  The same procedure was performed on the right, with a 8.5x124mm screw placed.  After successful placement of the S2AI screws, fixation was confirmed into the pelvis.  Thus, the construct terminates in the ilium.  At this point, all screws were confirmed with AP and lateral flouroscopy.    Bone marrow was aspirated from the ilium via the same incision, and handed off for processing and concentration.  This concentrate was used to mix with the allograft later in the case.  Rods were measured to length, cut, and shaped. The rods were secured using locking caps to manufacturer's specifications. Final AP and lateral radiographs were taken to confirm placement of instrumentation and appropriate alignment. The wound was copiously irrigated, then the external surfaces of the remaining lamina, facet, and transverse processes from L2 to the pelvis were decorticated. A mixture of allograft and autograft was placed over the decorticated surfaces for arthrodesis.  2 drains were placed subfascially.   After hemostasis, the wound was closed in layers with 0 and 2-0 vicryl. 2-0 nylon was used on the skin.   The patient was then flipped supine and  extubated with incident. All counts were correct times 2 at the end of the case. No immediate complications were noted.   Howard Olp PA assisted in the entire procedure.  Meade Maw MD Neurosurgery

## 2018-04-02 NOTE — OR Nursing (Signed)
6 screws, 2 rods explanted from lumbar spine

## 2018-04-03 ENCOUNTER — Inpatient Hospital Stay: Payer: Medicare Other

## 2018-04-03 LAB — CBC WITH DIFFERENTIAL/PLATELET
Abs Immature Granulocytes: 0.07 10*3/uL (ref 0.00–0.07)
Basophils Absolute: 0 10*3/uL (ref 0.0–0.1)
Basophils Relative: 0 %
Eosinophils Absolute: 0 10*3/uL (ref 0.0–0.5)
Eosinophils Relative: 0 %
HCT: 36.7 % — ABNORMAL LOW (ref 39.0–52.0)
Hemoglobin: 12.4 g/dL — ABNORMAL LOW (ref 13.0–17.0)
Immature Granulocytes: 1 %
Lymphocytes Relative: 12 %
Lymphs Abs: 1.9 10*3/uL (ref 0.7–4.0)
MCH: 30.2 pg (ref 26.0–34.0)
MCHC: 33.8 g/dL (ref 30.0–36.0)
MCV: 89.3 fL (ref 80.0–100.0)
Monocytes Absolute: 1.2 10*3/uL — ABNORMAL HIGH (ref 0.1–1.0)
Monocytes Relative: 8 %
Neutro Abs: 12.1 10*3/uL — ABNORMAL HIGH (ref 1.7–7.7)
Neutrophils Relative %: 79 %
Platelets: 167 10*3/uL (ref 150–400)
RBC: 4.11 MIL/uL — ABNORMAL LOW (ref 4.22–5.81)
RDW: 14.3 % (ref 11.5–15.5)
WBC: 15.2 10*3/uL — ABNORMAL HIGH (ref 4.0–10.5)
nRBC: 0 % (ref 0.0–0.2)

## 2018-04-03 LAB — GLUCOSE, CAPILLARY
GLUCOSE-CAPILLARY: 126 mg/dL — AB (ref 70–99)
GLUCOSE-CAPILLARY: 119 mg/dL — AB (ref 70–99)
Glucose-Capillary: 133 mg/dL — ABNORMAL HIGH (ref 70–99)
Glucose-Capillary: 118 mg/dL — ABNORMAL HIGH (ref 70–99)

## 2018-04-03 MED ORDER — INFLUENZA VAC SPLIT QUAD 0.5 ML IM SUSY: 0.5000 mL | PREFILLED_SYRINGE | INTRAMUSCULAR | Status: DC

## 2018-04-03 MED ORDER — KETOROLAC TROMETHAMINE 15 MG/ML IJ SOLN: 15.0000 mg | Freq: Four times a day (QID) | INTRAMUSCULAR | Status: DC

## 2018-04-03 NOTE — Evaluation (Signed)
Occupational Therapy Evaluation Patient Details Name: Howard Murphy MRN: [id number] DOB: [birth year] Today's Date: 04/03/2018    History of Present Illness admitted for acute hospitalization status post XLIF L2-3 and revision of posterior fusion L2-S2 (10/23).   Clinical Impression   Pt seen for OT evaluation this date, POD#1 from above surgery. Prior to hospital admission, pt was modified independent with mobility (using SPC), ADL, and IADL. However, has been having increased difficulty with all aspects of mobility and ADL due to back pain. Pt lives with fiance in a mobile home with 2 steps to enter and handrails on both sides with fiance able to provide 24/7 assist/support as needed for pt. Pt has had similar surgeries in the past and has all DME/AE equipment needed at home. Currently pt is at [facility] level with  LB ADL tasks. Pt/fiance educated in back precautions with handout provided, self care skills, bed mobility, AE/DME for bathing, dressing, and toileting needs, and home/routines modifications and falls prevention strategies to maximize safety and functional independence while minimizing falls risk and maintaining precautions. Pt verbalized understanding of all education/training provided. Handout provided to support recall and carry over of learned precautions/techniques for bed mobility, functional transfers, and self care skills. No additional skilled OT needs at this time. Will discharge in house. Upon hospital discharge, pt safe to discharge home.      Follow Up Recommendations  No OT follow up    Equipment Recommendations  None recommended by OT    Recommendations for Other Services       Precautions / Restrictions Precautions Precautions: Back Precaution Booklet Issued: Yes (comment) Precaution Comments: spinal precautions handout provided Restrictions Weight Bearing Restrictions: No      Mobility Bed Mobility        General bed mobility comments: deferred; nursing  in room for IV assessment  Transfers         General transfer comment: deferred; nursing in room for IV assessment    Balance                                 ADL either performed or assessed with clinical judgement   ADL Overall ADL's : Needs assistance/impaired                                       General ADL Comments: Min A for LB ADL; fiance able to assist 24/7.     Vision Baseline Vision/History: Wears glasses Wears Glasses: Reading only Patient Visual Report: No change from baseline       Perception     Praxis      Pertinent Vitals/Pain Pain Assessment: 0-10 Pain Score: 8   Pain Location: back Pain Descriptors / Indicators: Radiating(radiates down BLE) Pain Intervention(s): Limited activity within patient's tolerance;Monitored during session;Premedicated before session     Hand Dominance Right   Extremity/Trunk Assessment Upper Extremity Assessment Upper Extremity Assessment: Overall WFL for tasks assessed   Lower Extremity Assessment Lower Extremity Assessment: Defer to PT evaluation       Communication Communication Communication: No difficulties   Cognition Arousal/Alertness: Awake/alert Behavior During Therapy: WFL for tasks assessed/performed Overall Cognitive Status: Within Functional Limits for tasks assessed  General Comments       Exercises Other Exercises Other Exercises:  Other Exercises: Pt/fiance educated in spinal precautions with handout provided. Other Exercises:  Pt/fiance educated in AE for LB dressing tasks. Other Exercises: Pt/fiance educated in compression sock mgt including donning/doffing, wear schedule, and positioning.   Shoulder Instructions      Home Living Family/patient expects to be discharged to:: Private residence Living Arrangements: Spouse/significant other Available Help at Discharge: Family Type of Home: Mobile home Home  Access: Stairs to enter Entrance Stairs-Number of Steps: 2 Entrance Stairs-Rails: Right Home Layout: One level     Bathroom Shower/Tub: Teacher, early years/pre: Handicapped height     Home Equipment: [place] - single point;Walker - 2 wheels;Walker - 4 wheels;Bedside commode          Prior Functioning/Environment Level of Independence: Independent with assistive device(s)        Comments: Mod indep with SPC for ADLS, household and community mobilization; does endorse at least 2 falls within previous six months (due to LEs giving out)        OT Problem List:        OT Treatment/Interventions:      OT Goals(Current goals can be found in the care plan section) Acute Rehab OT Goals Patient Stated Goal: get back to playing card games OT Goal Formulation: All assessment and education complete, DC therapy Potential to Achieve Goals: Good  OT Frequency:     Barriers to D/C:            Co-evaluation              AM-PAC PT "6 Clicks" Daily Activity     Outcome Measure Help from another person eating meals?: None Help from another person taking care of personal grooming?: None Help from another person toileting, which includes using toliet, bedpan, or urinal?: A Little Help from another person bathing (including washing, rinsing, drying)?: A Little Help from another person to put on and taking off regular upper body clothing?: None Help from another person to put on and taking off regular lower body clothing?: A Little 6 Click Score: 21   End of Session    Activity Tolerance: Patient limited by pain Patient left: in bed;with call bell/phone within reach;with bed alarm set;with SCD's reapplied;with family/visitor present;with nursing/sitter in room  OT Visit Diagnosis: Other abnormalities of gait and mobility (R26.89);Pain Pain - Right/Left: Right(both) Pain - part of body: Leg(back )                Time: 7622-6333 OT Time Calculation (min): 16  min Charges:     Jadene Pierini OTS  04/03/2018, 4:36 PM

## 2018-04-03 NOTE — Progress Notes (Signed)
Procedure: Posterior lumbar fusion L2-S2, L2-3 XLIF Procedure date: 04/03/2018 Diagnosis: Lumbar radiculopathy  History: Howard Murphy is POD 1 status post posterior lumbar fusion L2-S2 with L2-3 XLIF.  Lower extremity symptoms that he was experiencing prior to surgery have resolved; however, he is experiencing significant back pain currently rated 8/10.  Currently on PCA for pain management.  Physical Exam: Vitals:   04/03/18 0755 04/03/18 1025  BP: 120/72   Pulse: 79   Resp: 18 18  Temp: 98.3 F (36.8 C)   SpO2: 100% 100%    AA Ox3 Skin: Incisions dressed without any drainage present.  Drain sites intact. Strength: 5 out of 5 throughout lower extremities Sensation: Intact and symmetric throughout lower extremities  Data:  Recent Labs  Lab 04/02/18 1039  NA 140  K 3.2*  GLUCOSE 157*   No results for input(s): AST, ALT, ALKPHOS in the last 168 hours.  Invalid input(s): TBILI   Recent Labs  Lab 04/03/18 0942  WBC 15.2*  HGB 12.4*  HCT 36.7*  PLT 167   No results for input(s): APTT, INR in the last 168 hours.       Other tests/results: no Imaging reviewed  Assessment/Plan:  [name] POD 1 status post posterior lumbar fusion L2-S2 with L2-3 XLIF.  Lower extremity symptoms have resolved following procedure, but he does complain of significant back pain.  Will attempt Toradol injection for potential improvement in pain. He was attempted today but he was unable to complete this due to pain.  X-rays were also attempted today but he was unable to stand for imaging.   - mobilize - pain control - DVT prophylaxis - PTOT  Marin Olp PA-C Department of Neurosurgery

## 2018-04-03 NOTE — Plan of Care (Signed)

## 2018-04-03 NOTE — Progress Notes (Signed)
Patient received from OR to 1A 146. Alert and oriented x 4. Patient oriented to his room, ascom/call bell and staff.  Initial sep up for PCA pump done. No acute distress noted. Will continue to monitor.

## 2018-04-03 NOTE — Evaluation (Signed)
Physical Therapy Evaluation Patient Details Name: FREEDOM PEDDY MRN: [id number] DOB: 1955-12-12 Today's Date: 04/03/2018   History of Present Illness  admitted for acute hospitalization status post XLIF L2-3 and revision of posterior fusion L2-S2 (10/23).  Clinical Impression  Upon evaluation, patient alert and oriented; follows all commands and demonstrates good insight/safety awareness.  Eager for OOB attempts; generally guarded due to pain.  Bilat UE/LE strength and ROM grossly symmetrical and WFL For basic transfers and mobility; no focal weakness, paresthesia or radicular symptoms reported.  Able to complete bed mobility with close sup; sit/stand, basic transfers and gait (200') with RW, cga/min assist.  Reciprocal stepping pattern with decreased cadence/gait speed, but no overt buckling or LOB.  Anticipate mobility to progress well as pain improves. Did encourage OOB to chair/gait efforts 3x/day (per physician order); patient/fiancee voiced understanding. Would benefit from skilled PT to address above deficits and promote optimal return to PLOF; Recommend transition to [facility] upon discharge from acute hospitalization.     Follow Up Recommendations Home health PT    Equipment Recommendations  Rolling walker with 5" wheels    Recommendations for Other Services       Precautions / Restrictions Precautions Precautions: Fall Restrictions Weight Bearing Restrictions: No      Mobility  Bed Mobility Overal bed mobility: Needs Assistance Bed Mobility: Supine to Sit;Sit to Supine     Supine to sit: Supervision Sit to supine: Supervision   General bed mobility comments: heavy use of bedrails to assist; good use of log rolling technique  Transfers Overall transfer level: Needs assistance Equipment used: Rolling walker (2 wheeled) Transfers: Sit to/from Stand Sit to Stand: Min guard;Min assist         General transfer comment: min cuing for hand placement with initial  transfer  Ambulation/Gait Ambulation/Gait assistance: Min guard;Min assist Gait Distance (Feet): 200 Feet Assistive device: Rolling walker (2 wheeled)   Gait velocity: 10' walk time, 10 seconds   General Gait Details: reciprocal stepping pattern with decreased cadence, gait speed; however, no buckling or LOB.  Fair tolerance for postural extension.    Stairs            Wheelchair Mobility    Modified Rankin (Stroke Patients Only)       Balance Overall balance assessment: Needs assistance Sitting-balance support: No upper extremity supported;Feet supported Sitting balance-Leahy Scale: Good     Standing balance support: Bilateral upper extremity supported Standing balance-Leahy Scale: Fair                               Pertinent Vitals/Pain Pain Assessment: Faces Faces Pain Scale: Hurts little more Pain Location: back Pain Descriptors / Indicators: Aching;Grimacing;Guarding Pain Intervention(s): Limited activity within patient's tolerance;Monitored during session;Repositioned    Home Living Family/patient expects to be discharged to:: Private residence Living Arrangements: Spouse/significant other Available Help at Discharge: Family Type of Home: Mobile home Home Access: Stairs to enter Entrance Stairs-Rails: Right Entrance Stairs-Number of Steps: 2 Home Layout: One level Home Equipment: Cane - single point      Prior Function Level of Independence: Independent with assistive device(s)         Comments: Mod indep with SPC for ADLS, household and community mobilization; does endorse at least 2 falls within previous six months (due to LEs giving out)     Hand Dominance   Dominant Hand: Right    Extremity/Trunk Assessment   Upper Extremity Assessment Upper Extremity  Assessment: Overall WFL for tasks assessed    Lower Extremity Assessment Lower Extremity Assessment: Overall WFL for tasks assessed(grossly at least 4/5, symmetrical;  denies paresthesia or radicular symptoms)       Communication   Communication: No difficulties  Cognition Arousal/Alertness: Awake/alert Behavior During Therapy: WFL for tasks assessed/performed Overall Cognitive Status: Within Functional Limits for tasks assessed                                        General Comments      Exercises Other Exercises Other Exercises: Educated in log rolling technique and back precautions; patient voiced understanding.  Good demonstration and integration into functional activities.   Assessment/Plan    PT Assessment Patient needs continued PT services  PT Problem List Decreased strength;Decreased range of motion;Decreased activity tolerance;Decreased balance;Decreased mobility;Decreased coordination;Cardiopulmonary status limiting activity;Decreased skin integrity;Pain       PT Treatment Interventions DME instruction;Gait training;Stair training;Functional mobility training;Therapeutic activities;Therapeutic exercise;Balance training;Patient/family education    PT Goals (Current goals can be found in the Care Plan section)  Acute Rehab PT Goals Patient Stated Goal: to try to move PT Goal Formulation: With patient Time For Goal Achievement: 04/17/18 Potential to Achieve Goals: Good    Frequency 7X/week   Barriers to discharge        Co-evaluation               AM-PAC PT "6 Clicks" Daily Activity  Outcome Measure Difficulty turning over in bed (including adjusting bedclothes, sheets and blankets)?: A Little Difficulty moving from lying on back to sitting on the side of the bed? : A Little Difficulty sitting down on and standing up from a chair with arms (e.g., wheelchair, bedside commode, etc,.)?: Unable Help needed moving to and from a bed to chair (including a wheelchair)?: A Little Help needed walking in hospital room?: A Little Help needed climbing 3-5 steps with a railing? : A Little 6 Click Score: 16     End of Session Equipment Utilized During Treatment: Gait belt;Oxygen Activity Tolerance: Patient tolerated treatment well Patient left: in bed;with call bell/phone within reach;with bed alarm set;with family/visitor present Nurse Communication: Mobility status PT Visit Diagnosis: Muscle weakness (generalized) (M62.81);Difficulty in walking, not elsewhere classified (R26.2);Pain    Time: 1009-1035 PT Time Calculation (min) (ACUTE ONLY): 26 min   Charges:   PT Evaluation $PT Eval Moderate Complexity: 1 Mod PT Treatments $Therapeutic Activity: 8-22 mins       Malikai Gut H. Owens Shark, PT, DPT, NCS 04/03/18, 1:37 PM [phone]

## 2018-04-03 NOTE — Progress Notes (Signed)
PT Cancellation Note  Patient Details Name: Howard Murphy MRN: [id number] DOB: December 29, 1955   Cancelled Treatment:    Reason Eval/Treat Not Completed: Pain limiting ability to participate(Consult received and chart reviewed.  Patient reporting significant pain after recent return from x-ray; additionally breakfast tray just arrived.  Requests therapist re-attempt at later time this AM.  Will continue efforts as appropriate.)  Solina Heron H. Owens Shark, PT, DPT, NCS 04/03/18, 8:45 AM [phone]

## 2018-04-04 ENCOUNTER — Inpatient Hospital Stay: Payer: Medicare Other

## 2018-04-04 LAB — CBC WITH DIFFERENTIAL/PLATELET
Abs Immature Granulocytes: 0.06 10*3/uL (ref 0.00–0.07)
Basophils Absolute: 0 10*3/uL (ref 0.0–0.1)
Basophils Relative: 0 %
Eosinophils Absolute: 0 10*3/uL (ref 0.0–0.5)
Eosinophils Relative: 0 %
HCT: 33.4 % — ABNORMAL LOW (ref 39.0–52.0)
Hemoglobin: 11.3 g/dL — ABNORMAL LOW (ref 13.0–17.0)
Immature Granulocytes: 1 %
Lymphocytes Relative: 20 %
Lymphs Abs: 2.3 10*3/uL (ref 0.7–4.0)
MCH: 30.5 pg (ref 26.0–34.0)
MCHC: 33.8 g/dL (ref 30.0–36.0)
MCV: 90.3 fL (ref 80.0–100.0)
Monocytes Absolute: 1 10*3/uL (ref 0.1–1.0)
Monocytes Relative: 8 %
Neutro Abs: 8.3 10*3/uL — ABNORMAL HIGH (ref 1.7–7.7)
Neutrophils Relative %: 71 %
Platelets: 138 10*3/uL — ABNORMAL LOW (ref 150–400)
RBC: 3.7 MIL/uL — ABNORMAL LOW (ref 4.22–5.81)
RDW: 14.4 % (ref 11.5–15.5)
WBC: 11.8 10*3/uL — ABNORMAL HIGH (ref 4.0–10.5)
nRBC: 0 % (ref 0.0–0.2)

## 2018-04-04 LAB — GLUCOSE, CAPILLARY
GLUCOSE-CAPILLARY: 113 mg/dL — AB (ref 70–99)
Glucose-Capillary: 141 mg/dL — ABNORMAL HIGH (ref 70–99)
Glucose-Capillary: 151 mg/dL — ABNORMAL HIGH (ref 70–99)
Glucose-Capillary: 137 mg/dL — ABNORMAL HIGH (ref 70–99)

## 2018-04-04 MED ORDER — OXYCODONE HCL 5 MG PO TABS
10.0000 mg | ORAL_TABLET | ORAL | Status: DC | PRN
  Administered 2018-04-04 (×2): 15 mg via ORAL
  Administered 2018-04-04: 10 mg via ORAL
  Filled 2018-04-04: qty 3
  Filled 2018-04-04: qty 2
  Filled 2018-04-04: qty 3

## 2018-04-04 MED ORDER — METHOCARBAMOL 1000 MG/10ML IJ SOLN
500.0000 mg | Freq: Four times a day (QID) | INTRAVENOUS | Status: DC
  Filled 2018-04-04 (×4): qty 5

## 2018-04-04 MED ORDER — SODIUM CHLORIDE 0.9 % IV SOLN: INTRAVENOUS | Status: DC

## 2018-04-04 MED ORDER — METHOCARBAMOL 500 MG PO TABS
1000.0000 mg | ORAL_TABLET | Freq: Four times a day (QID) | ORAL | Status: DC
  Administered 2018-04-04 – 2018-04-06 (×8): 1000 mg via ORAL
  Filled 2018-04-04 (×8): qty 2

## 2018-04-04 MED ORDER — OXYCODONE HCL 5 MG PO TABS: 5.0000 mg | ORAL_TABLET | ORAL | Status: DC | PRN

## 2018-04-04 NOTE — Progress Notes (Signed)
Pt BP 89/55. Dr. Duane Boston made aware. Order for normal saline @ 75/hr obtained.

## 2018-04-04 NOTE — Progress Notes (Signed)
Procedure: Posterior lumbar fusion L2-S2, L2-3 XLIF Procedure date: 04/03/2018 Diagnosis: Lumbar radiculopathy  History: POD2: Patient is doing well. Lower extremity symptoms remain resolved. Back pain continues to be reported 8/10. He has been working with PT and OT with positive progression and is able to ambulate. Catheter removed yesterday and he is voiding without issue. Able to tolerate solid diet. Drain output 100.    POD1 [name] is POD 1 status post posterior lumbar fusion L2-S2 with L2-3 XLIF.  Lower extremity symptoms that he was experiencing prior to surgery have resolved; however, he is experiencing significant back pain currently rated 8/10.  Currently on PCA for pain management.  Physical Exam: Vitals:   04/04/18 1000 04/04/18 1410  BP: 123/81 102/67  Pulse: 94 92  Resp:    Temp:  98.8 F (37.1 C)  SpO2:  94%    AA Ox3, appears more comfortabe than yesterday  Skin: Dressings mildly soiled at posterior incision. Clean and dry at lateral incision. No active bleeding. Multiple areas of blistering mainly near adhesive parts of dressings, but also has blistering above lateral site and within the superior and inferior (most prominent) aspect of posterior incision site.Drains intact  Strength: 5/5 throughout lower extremities  Sensation: Intact and symmetric throughout lower extremities  Data:  Recent Labs  Lab 04/02/18 1039  NA 140  K 3.2*  GLUCOSE 157*   No results for input(s): AST, ALT, ALKPHOS in the last 168 hours.  Invalid input(s): TBILI   Recent Labs  Lab 04/03/18 0942 04/04/18 1053  WBC 15.2* 11.8*  HGB 12.4* 11.3*  HCT 36.7* 33.4*  PLT 167 138*   No results for input(s): APTT, INR in the last 168 hours.       Other tests/results:  EXAM: LUMBAR SPINE - 2-3 VIEW  COMPARISON:  Fluoroscopic images of April 02, 2018. CT scan of December 29, 2017.  FINDINGS: Status post surgical posterior fusion extending from L2-S2 with bilateral  intrapedicular screw placement. Interbody fusion of L2-3, L3-4 and L4-5 is noted. Good alignment vertebral bodies is noted.  IMPRESSION: Status post surgical posterior fusion as described above.  Assessment/Plan:  LEXANDER TREMBLAY is POD 2 status post posterior lumbar fusion L2-S2 with L2-3 XLIF. Continues to complain of significant back pain.   - PCA discontinued this morning and he was transitioned to oral medications 5-15mg  oxycodone q3 PRN, in addition to at-home percocet q6. Robaxin increased to 1000mg .  - Output of drains will be monitored separately. - Xrays completed.  - Dressings at incision and drain sites were removed due to blistering. Most blisters broke during removal of dressing and continued to weep. Incision, drain, and blister sites were cleaned and dressed with gauze and paper tape.  - Transition to lighter dressing on blisters tomorrow after weeping has stopped. - H/H: 11.3/33.4 - discontinue monitoring - continue PT - DVT prophylaxis - mobilize  Marin Olp PA-C Department of Neurosurgery

## 2018-04-04 NOTE — Care Management Important Message (Signed)
Important Message  Patient Details  Name: Howard Murphy MRN: [id number] Date of Birth: [birth year]   Medicare Important Message Given:  Yes    Juliann Pulse A Awanda Wilcock 04/04/2018, 10:46 AM

## 2018-04-04 NOTE — Progress Notes (Signed)
Physical Therapy Treatment Patient Details Name: Howard Murphy MRN: [id number] DOB: 12/30/1955 Today's Date: 04/04/2018    History of Present Illness admitted for acute hospitalization status post XLIF L2-3 and revision of posterior fusion L2-S2 (10/23).    PT Comments    Improved activity tolerance and gait performance, completing two full laps around nursing station (one with RW, one without), cga/close sup.  Gradual improvement in cadence and overall gait mechanics.  Patient very motivated; making good progress towards all mobility goals.  Will plan to assess stair negotiation once multiple lines/leads discontinued.     Follow Up Recommendations  Home health PT     Equipment Recommendations       Recommendations for Other Services       Precautions / Restrictions Precautions Precautions: Back Precaution Comments: PCA pump Restrictions Weight Bearing Restrictions: No    Mobility  Bed Mobility Overal bed mobility: Modified Independent             General bed mobility comments: increased time, heavy use of bedrails required  Transfers Overall transfer level: Needs assistance Equipment used: Rolling walker (2 wheeled) Transfers: Sit to/from Stand Sit to Stand: Supervision         General transfer comment: cuing for hand placement; good LE strength/power, good mechanics  Ambulation/Gait Ambulation/Gait assistance: Min guard;Supervision Gait Distance (Feet): 200 Feet Assistive device: Rolling walker (2 wheeled) Gait Pattern/deviations: WFL(Within Functional Limits) Gait velocity: 10' walk time, 9 seconds   General Gait Details: reciprocal stepping pattern with fair cadence, gait speed; improving comfort and confidence.  Good LE strength and control.  Minimal use of walker noted.   Stairs             Wheelchair Mobility    Modified Rankin (Stroke Patients Only)       Balance Overall balance assessment: Needs assistance Sitting-balance  support: Feet supported;Feet unsupported Sitting balance-Leahy Scale: Good     Standing balance support: Bilateral upper extremity supported Standing balance-Leahy Scale: Good                              Cognition Arousal/Alertness: Awake/alert Behavior During Therapy: WFL for tasks assessed/performed Overall Cognitive Status: Within Functional Limits for tasks assessed                                        Exercises Other Exercises Other Exercises: 200' without assist device, cga/close sup-minimal change in gait performance, mechanics without use of assist device; recommend continued training without assist device in subsequent session Other Exercises: Standing LE therex, 1x10, AROM for muscular strength/endurance/balance: heel raises, mini squats, marching, hip flex/ext/abduct/adduct.  Does require bila tUE support (countertop) for activities involving periods of SLS.    General Comments        Pertinent Vitals/Pain Pain Assessment: 0-10 Pain Score: 8  Pain Location: back Pain Descriptors / Indicators: Aching;Guarding Pain Intervention(s): Limited activity within patient's tolerance;PCA encouraged;Monitored during session;Repositioned    Home Living                      Prior Function            PT Goals (current goals can now be found in the care plan section) Acute Rehab PT Goals Patient Stated Goal: get back to playing card games PT Goal Formulation: With patient Time For Goal  Achievement: 04/17/18 Potential to Achieve Goals: Good Progress towards PT goals: Progressing toward goals    Frequency    7X/week      PT Plan Current plan remains appropriate    Co-evaluation              AM-PAC PT "6 Clicks" Daily Activity  Outcome Measure  Difficulty turning over in bed (including adjusting bedclothes, sheets and blankets)?: None Difficulty moving from lying on back to sitting on the side of the bed? :  None Difficulty sitting down on and standing up from a chair with arms (e.g., wheelchair, bedside commode, etc,.)?: A Little Help needed moving to and from a bed to chair (including a wheelchair)?: A Little Help needed walking in hospital room?: A Little Help needed climbing 3-5 steps with a railing? : A Little 6 Click Score: 20    End of Session Equipment Utilized During Treatment: Gait belt;Oxygen Activity Tolerance: Patient tolerated treatment well Patient left: in bed;with call bell/phone within reach;with bed alarm set;with family/visitor present Nurse Communication: Mobility status PT Visit Diagnosis: Muscle weakness (generalized) (M62.81);Difficulty in walking, not elsewhere classified (R26.2);Pain     Time: 7943-2761 PT Time Calculation (min) (ACUTE ONLY): 23 min  Charges:  $Gait Training: 8-22 mins $Therapeutic Exercise: 8-22 mins                    Alexys Gassett H. Owens Shark, PT, DPT, NCS 04/04/18, 11:48 AM [phone]

## 2018-04-05 LAB — GLUCOSE, CAPILLARY
GLUCOSE-CAPILLARY: 136 mg/dL — AB (ref 70–99)
GLUCOSE-CAPILLARY: 135 mg/dL — AB (ref 70–99)
GLUCOSE-CAPILLARY: 124 mg/dL — AB (ref 70–99)
Glucose-Capillary: 164 mg/dL — ABNORMAL HIGH (ref 70–99)

## 2018-04-05 MED ORDER — SENNOSIDES-DOCUSATE SODIUM 8.6-50 MG PO TABS
2.0000 | ORAL_TABLET | Freq: Two times a day (BID) | ORAL | Status: DC
  Administered 2018-04-05 – 2018-04-06 (×3): 2 via ORAL
  Filled 2018-04-05 (×3): qty 2

## 2018-04-05 MED ORDER — CELECOXIB 100 MG PO CAPS
100.0000 mg | ORAL_CAPSULE | Freq: Two times a day (BID) | ORAL | Status: DC
  Administered 2018-04-05 – 2018-04-06 (×3): 100 mg via ORAL
  Filled 2018-04-05 (×5): qty 1

## 2018-04-05 MED ORDER — LACTULOSE 10 GM/15ML PO SOLN: 20.0000 g | Freq: Two times a day (BID) | ORAL | Status: DC | PRN

## 2018-04-05 MED ORDER — OXYCODONE HCL 5 MG PO TABS
5.0000 mg | ORAL_TABLET | ORAL | Status: DC | PRN
  Administered 2018-04-05 (×4): 10 mg via ORAL
  Filled 2018-04-05 (×4): qty 2

## 2018-04-05 MED ORDER — BISACODYL 10 MG RE SUPP
10.0000 mg | Freq: Once | RECTAL | Status: AC
  Administered 2018-04-05: 10 mg via RECTAL
  Filled 2018-04-05: qty 1

## 2018-04-05 MED ORDER — OXYCODONE-ACETAMINOPHEN 5-325 MG PO TABS
1.0000 | ORAL_TABLET | Freq: Four times a day (QID) | ORAL | Status: DC | PRN
  Administered 2018-04-05: 2 via ORAL
  Filled 2018-04-05 (×2): qty 2

## 2018-04-05 MED ORDER — ENOXAPARIN SODIUM 30 MG/0.3ML ~~LOC~~ SOLN
30.0000 mg | Freq: Two times a day (BID) | SUBCUTANEOUS | Status: DC
  Administered 2018-04-05 – 2018-04-06 (×3): 30 mg via SUBCUTANEOUS
  Filled 2018-04-05 (×2): qty 0.3

## 2018-04-05 MED ORDER — ACETAMINOPHEN 325 MG PO TABS
650.0000 mg | ORAL_TABLET | ORAL | Status: DC
  Administered 2018-04-05 – 2018-04-06 (×5): 650 mg via ORAL
  Filled 2018-04-05 (×5): qty 2

## 2018-04-05 MED ORDER — BISACODYL 5 MG PO TBEC
5.0000 mg | DELAYED_RELEASE_TABLET | Freq: Once | ORAL | Status: AC
  Administered 2018-04-05: 5 mg via ORAL
  Filled 2018-04-05: qty 1

## 2018-04-05 MED ORDER — GABAPENTIN 300 MG PO CAPS
300.0000 mg | ORAL_CAPSULE | Freq: Three times a day (TID) | ORAL | Status: DC
  Administered 2018-04-05 – 2018-04-06 (×4): 300 mg via ORAL
  Filled 2018-04-05 (×4): qty 1

## 2018-04-05 NOTE — Progress Notes (Signed)
Pt's dilaudid PCA discontinued. 5cc remaining, wasted in med room in narcotic box with Charlynn Court, RN.

## 2018-04-05 NOTE — Progress Notes (Signed)
Physical Therapy Treatment Patient Details Name: Howard Murphy MRN: [id number] DOB: [birth year] Today's Date: 04/05/2018    History of Present Illness Pt admitted for acute hospitalization status post XLIF L2-3 and revision of posterior fusion L2-S2 (10/23).    PT Comments    Pt presents with min deficits in strength, transfers, mobility, gait, and activity tolerance and is progressing well towards goals.  Pt continues to require increased time and effort with bed mobility tasks but no physical assistance.  Pt showed good eccentric and concentric control during transfers with min verbal cues for sequencing to maintain back precautions.  Pt was able to amb 200' with a RW with SBA and 30' without an AD with CGA with good stability throughout.  Pt ascended and descended 3 steps with one rail x 2 with CGA.  Pt demonstrated good eccentric and concentric control during stair training.   Pt will benefit from HHPT services upon discharge to safely address above deficits for decreased caregiver assistance and eventual return to PLOF.     Follow Up Recommendations  Home health PT     Equipment Recommendations  Rolling walker with 5" wheels    Recommendations for Other Services       Precautions / Restrictions Precautions Precautions: Back Restrictions Weight Bearing Restrictions: No    Mobility  Bed Mobility Overal bed mobility: Modified Independent             General bed mobility comments: increased time, heavy use of bedrails required  Transfers Overall transfer level: Needs assistance Equipment used: Rolling walker (2 wheeled) Transfers: Sit to/from Stand Sit to Stand: Supervision         General transfer comment: Min verbal cues for sequencing to maintain back precautions  Ambulation/Gait Ambulation/Gait assistance: Min guard;Supervision Gait Distance (Feet): 200 Feet Assistive device: Rolling walker (2 wheeled);None Gait Pattern/deviations: Step-through  pattern;Decreased stride length     General Gait Details: Min verbal cues for upright posture and amb closer to the RW with good stability and fair cadence.   Stairs Stairs: Yes Stairs assistance: Min guard Stair Management: One rail Right Number of Stairs: 3 General stair comments: Good control and stability ascending and descending stairs with one rail   Wheelchair Mobility    Modified Rankin (Stroke Patients Only)       Balance Overall balance assessment: Needs assistance Sitting-balance support: Feet supported;Feet unsupported Sitting balance-Leahy Scale: Good     Standing balance support: Bilateral upper extremity supported Standing balance-Leahy Scale: Good                              Cognition Arousal/Alertness: Awake/alert Behavior During Therapy: WFL for tasks assessed/performed Overall Cognitive Status: Within Functional Limits for tasks assessed                                        Exercises Total Joint Exercises Ankle Circles/Pumps: AROM;Both;10 reps Quad Sets: Strengthening;Both;10 reps Gluteal Sets: Strengthening;Both;10 reps Long Arc Quad: AROM;10 reps;Both Knee Flexion: AROM;Both;10 reps Marching in Standing: AROM;Both;10 reps;Standing Other Exercises Other Exercises: Log roll technique training and review Other Exercises: Back precaution review    General Comments        Pertinent Vitals/Pain Pain Assessment: 0-10 Pain Score: 7  Pain Location: back Pain Descriptors / Indicators: Aching;Sore Pain Intervention(s): Monitored during session    Home Living  Prior Function            PT Goals (current goals can now be found in the care plan section) Progress towards PT goals: Progressing toward goals    Frequency    7X/week      PT Plan Current plan remains appropriate    Co-evaluation              AM-PAC PT "6 Clicks" Daily Activity  Outcome Measure                    End of Session Equipment Utilized During Treatment: Gait belt Activity Tolerance: Patient tolerated treatment well Patient left: Other (comment)(Pt left in BR toileting with education to ring call bell when done) Nurse Communication: Mobility status PT Visit Diagnosis: Muscle weakness (generalized) (M62.81);Difficulty in walking, not elsewhere classified (R26.2);Pain     Time: 5284-1324 PT Time Calculation (min) (ACUTE ONLY): 23 min  Charges:  $Gait Training: 8-22 mins $Therapeutic Exercise: 8-22 mins                     D. Scott Jaziyah Gradel PT, DPT 04/05/18, 11:08 AM

## 2018-04-05 NOTE — Progress Notes (Signed)
Procedure: Posterior lumbar fusion L2-S2, L2-3 XLIF Procedure date: 04/03/2018 Diagnosis: Lumbar radiculopathy  History: POD3: Patient reports pain.  Did not receive pain meds last night due to low BP.  Now having some nausea due to lack of BM in the past few days.  Walked multiple times yesterday.   Lower extremity symptoms remain resolved.  Drain output near 0 on drain B, <100 on drain A.  POD2: Patient is doing well. Lower extremity symptoms remain resolved. Back pain continues to be reported 8/10. He has been working with PT and OT with positive progression and is able to ambulate. Catheter removed yesterday and he is voiding without issue. Able to tolerate solid diet. Drain output 100.   POD1 Howard Murphy is POD 1 status post posterior lumbar fusion L2-S2 with L2-3 XLIF.  Lower extremity symptoms that he was experiencing prior to surgery have resolved; however, he is experiencing significant back pain currently rated 8/10.  Currently on PCA for pain management.  Physical Exam: Vitals:   04/05/18 0231 04/05/18 0720  BP: (!) 92/58 113/80  Pulse: 91 (!) 107  Resp:    Temp:  98.3 F (36.8 C)  SpO2: 92% 95%    AA Ox3, appears comfortable  Skin: Dressings dry, intact.  Strength: 5/5 throughout lower extremities  Sensation: Intact and symmetric throughout lower extremities  Data:  Recent Labs  Lab 04/02/18 1039  NA 140  K 3.2*  GLUCOSE 157*   No results for input(s): AST, ALT, ALKPHOS in the last 168 hours.  Invalid input(s): TBILI   Recent Labs  Lab 04/03/18 0942 04/04/18 1053  WBC 15.2* 11.8*  HGB 12.4* 11.3*  HCT 36.7* 33.4*  PLT 167 138*   No results for input(s): APTT, INR in the last 168 hours.       Other tests/results:  EXAM: LUMBAR SPINE - 2-3 VIEW  COMPARISON:  Fluoroscopic images of April 02, 2018. CT scan of December 29, 2017.  FINDINGS: Status post surgical posterior fusion extending from L2-S2 with bilateral intrapedicular screw  placement. Interbody fusion of L2-3, L3-4 and L4-5 is noted. Good alignment vertebral bodies is noted.  IMPRESSION: Status post surgical posterior fusion as described above.  Assessment/Plan:  Howard Murphy is POD 3 status post posterior lumbar fusion L2-S2 with L2-3 XLIF. Continues to complain of significant back pain.   - PT/OT - Discontinue lisinopril - I have adjusted his pain medications - He needs a BM today.  Will order additional meds to help with BM. - Start lovenox - Take down dressings tomorrow and leave undressed after that time.  Meade Maw MD Department of Neurosurgery

## 2018-04-06 ENCOUNTER — Other Ambulatory Visit: Payer: Self-pay | Admitting: Family Medicine

## 2018-04-06 DIAGNOSIS — E1142 Type 2 diabetes mellitus with diabetic polyneuropathy: Secondary | ICD-10-CM

## 2018-04-06 LAB — GLUCOSE, CAPILLARY: GLUCOSE-CAPILLARY: 143 mg/dL — AB (ref 70–99)

## 2018-04-06 MED ORDER — ONDANSETRON 4 MG PO TBDP: 4.0000 mg | ORAL_TABLET | Freq: Three times a day (TID) | ORAL | 0 refills | Status: DC | PRN

## 2018-04-06 MED ORDER — CELECOXIB 100 MG PO CAPS: 100.0000 mg | ORAL_CAPSULE | Freq: Two times a day (BID) | ORAL | 0 refills | Status: DC

## 2018-04-06 MED ORDER — METHOCARBAMOL 500 MG PO TABS: 1000.0000 mg | ORAL_TABLET | Freq: Four times a day (QID) | ORAL | 0 refills | Status: DC | PRN

## 2018-04-06 MED ORDER — OXYCODONE HCL 5 MG PO TABS: 5.0000 mg | ORAL_TABLET | ORAL | 0 refills | Status: DC | PRN

## 2018-04-06 NOTE — Discharge Instructions (Signed)
NEUROSURGERY DISCHARGE INSTRUCTIONS  Admission Diagnosis:  Lumbar Radiculopathy  Discharge Diagnosis: Lumbar radiculopathy  Operative procedure: Posterior lumbar fusion L2-S2, L2-3 XLIF  The following are instructions to help in your recovery once you have been discharged from the hospital. Even if you feel well, it is important that you follow these activity guidelines. If you do not let your neck heal properly from the surgery, you can increase the chance of return of your symptoms and other complications.   What to do after you leave the hospital:  Recommended diet:  Increase protein intake to promote wound healing. You may return to your usual diet. However, you may experience discomfort when swallowing in the first month after your surgery. This is normal. You may find that softer foods are more comfortable for you to swallow. Be sure to stay hydrated.   Recommended activity: No bending, lifting, or twisting (BLT). Avoid lifting objects heavier than 10 pounds (gallon milk jug). Where possible, avoid household activities that involve lifting, bending, reaching, pushing, or pulling such as laundry, vacuuming, grocery shopping, and childcare. Try to arrange for help from friends and family for these activities while your back heals.   Increase physical activity slowly as tolerated. Taking short walks is encouraged, but avoid strenuous exercise. Do not jog, run, bicycle, lift weights, or participate in any other exercises unless specifically allowed by your doctor.   You should not drive until cleared by your doctor.   Until released by your doctor, you should not return to work or school. You should rest at home and let your body heal.   You may shower the day after your surgery. Avoid getting incision wet.  After showering, lightly dab your incision dry. Do not take a tub bath or go swimming until approved by your doctor at your follow-up appointment.   If you smoke, we strongly  recommend that you quit. Smoking has been proven to interfere with normal bone healing and will dramatically reduce the success rate of your surgery. Please contact [facility] (800-QUIT-NOW) and use the resources at www.[facility].com for assistance in stopping smoking.   Medications  Do not restart Aspirin until seven days after surgery You may restart lisinopril in 1 week.   * Do not take anti-inflammatory medications for 3 months after surgery (naproxen [Aleve], ibuprofen [Advil, Motrin], celecoxib [Celebrex], etc.). These medications can prevent your bones from healing properly.   You may restart home medications.   Wound Care Instructions  If you have a dressing on your incision, remove it two days after your surgery. Keep your incision area clean and dry.   If you have staples or stitches on your incision, you should have a follow up scheduled for removal. If you do not have staples or stitches, you will have steri-strips (small pieces of surgical tape) or Dermabond glue. The steri-strips/glue should begin to peel away within about a week (it is fine if the steri-strips fall off before then). If the strips are still in place one week after your surgery, you may gently remove them.    Please Report any of the following: Should you experience any of the following, contact us immediately:   New numbness or weakness   Pain that is progressively getting worse, and is not relieved by your pain medication, muscle relaxers, rest, and warm compresses   Bleeding, redness, swelling, pain, or drainage from surgical incision   Chills or flu-like symptoms   Fever greater than 101.0 F (38.3 C)   Inability to eat,  drink fluids, or take medications   Problems with bowel or bladder functions   Difficulty breathing or shortness of breath   Warmth, tenderness, or swelling in your calf    Additional Follow up appointments During office hours (Monday-Friday 9 am to 5 pm), please call your  physician at [phone]  After hours and weekends, please call the [facility] Operator at  [phone] and ask for the Neurosurgeon On Call   For a life-threatening emergency, call 911

## 2018-04-06 NOTE — Progress Notes (Signed)
Patient is being discharged home. Hemovac and dressing removed by PA. IV removed with cath intact. Reviewed discharge instructions with patient and his wife. Script given to patient. Allowed time for questions.

## 2018-04-06 NOTE — Discharge Summary (Addendum)
Procedure: Posterior lumbar fusion L2-S2, L2-3 XLIF Procedure date: 04/03/2018 Diagnosis: Lumbar radiculopathy  History: POD4: Patient feels much better than yesterday. Radicular symptoms remain resolved. Pain currently 7-8/10, but he hasn't received pain medication this morning. Had a bowel movement and is voiding without issue. Eating without issue. Continues to make progress with PT. Blood pressure has remained stable. Drain output 20.    POD3: Patient reports pain.  Did not receive pain meds last night due to low BP.  Now having some nausea due to lack of BM in the past few days.  Walked multiple times yesterday.   Lower extremity symptoms remain resolved.  Drain output near 0 on drain B, <100 on drain A.  POD2: Patient is doing well. Lower extremity symptoms remain resolved. Back pain continues to be reported 8/10. He has been working with PT and OT with positive progression and is able to ambulate. Catheter removed yesterday and he is voiding without issue. Able to tolerate solid diet. Drain output 100.   POD1 [name] is POD 1 status post posterior lumbar fusion L2-S2 with L2-3 XLIF.  Lower extremity symptoms that he was experiencing prior to surgery have resolved; however, he is experiencing significant back pain currently rated 8/10.  Currently on PCA for pain management.  Physical Exam: Vitals:   04/05/18 2337 04/06/18 0730  BP: 111/74 107/74  Pulse: 88 79  Resp: 18 18  Temp: 98.2 F (36.8 C) 98.1 F (36.7 C)  SpO2: 93% 96%    AA Ox3, appears comfortable  Skin: Dressings dry, intact. No new blistering noted. Blisters intact above lateral incision site, somewhat larger in appearance. Raw skin at previous blister sites, but appears as expected. No active bleeding or drainage.   Strength: 5/5 throughout lower extremities  Sensation: Intact and symmetric throughout lower extremities  Data:  Recent Labs  Lab 04/02/18 1039  NA 140  K 3.2*  GLUCOSE 157*   No  results for input(s): AST, ALT, ALKPHOS in the last 168 hours.  Invalid input(s): TBILI   Recent Labs  Lab 04/03/18 0942 04/04/18 1053  WBC 15.2* 11.8*  HGB 12.4* 11.3*  HCT 36.7* 33.4*  PLT 167 138*   No results for input(s): APTT, INR in the last 168 hours.       Other tests/results:  EXAM: LUMBAR SPINE - 2-3 VIEW  COMPARISON:  Fluoroscopic images of April 02, 2018. CT scan of December 29, 2017.  FINDINGS: Status post surgical posterior fusion extending from L2-S2 with bilateral intrapedicular screw placement. Interbody fusion of L2-3, L3-4 and L4-5 is noted. Good alignment vertebral bodies is noted.  IMPRESSION: Status post surgical posterior fusion as described above.  Assessment/Plan:  Howard Murphy is POD 4 status post posterior lumbar fusion L2-S2 with L2-3 XLIF. Continues to complain of back pain, but feels that it is tolerable with current regimen of pain medication, tylenol, celebrex, robaxin.  - Will continue regimen and have advised to hold lisinopril for 1 week due to episode of low BP.  - Home health set up through [facility].  - Recommend stool softener for constipation.  - Zofran provided for nausea.  - Dressings have been removed.  - Drain removed without issue - Discussed wound care.  - Advised to contact office if any issues or concerns arise. Otherwise, he will follow up in approximately 2 weeks for suture removal and monitoring of progress. Appointment has already been scheduled.    Howard Olp PA Department of Neurosurgery

## 2018-04-07 ENCOUNTER — Telehealth: Payer: Self-pay

## 2018-04-07 NOTE — Telephone Encounter (Signed)
Patient had surgery and was given a script for Oxycodone 5 mg.  Called [facility] in [place] to give permission to fill per patient request.  Patients wife notified.

## 2018-04-08 ENCOUNTER — Other Ambulatory Visit: Payer: Self-pay | Admitting: Family Medicine

## 2018-04-08 MED ORDER — TAMSULOSIN HCL 0.4 MG PO CAPS: 0.4000 mg | ORAL_CAPSULE | Freq: Every day | ORAL | 3 refills | Status: DC

## 2018-04-17 ENCOUNTER — Telehealth: Payer: Self-pay | Admitting: *Deleted

## 2018-04-18 NOTE — Telephone Encounter (Signed)
Spoke with pharmacy to give permission to fill presciption from the surgeon and the pharmacist states that it is an isnsurance issue because he gets Percocet 5/325 mg from [place] and was prescribed oxycodone 5 mg from the surgeon.  Pharmacist states she would not be comfortable filling it even if it was not an insurance issue.  Explained to the pharmacist our policy for surgery patients receiving pain medication and that it is in place so that the patient does not take our medication for the surgical pain and then run out of our medication that was given for the chronic pain.  Will call the patient and inform them that they need to discuss this with the surgeon.

## 2018-04-30 ENCOUNTER — Encounter: Payer: Self-pay | Admitting: Nurse Practitioner

## 2018-04-30 ENCOUNTER — Ambulatory Visit: Payer: Medicare Other | Attending: Nurse Practitioner | Admitting: Nurse Practitioner

## 2018-04-30 ENCOUNTER — Other Ambulatory Visit: Payer: Self-pay

## 2018-04-30 VITALS — BP 109/77 | HR 95 | Temp 97.6°F | Ht 67.0 in | Wt 182.0 lb

## 2018-04-30 DIAGNOSIS — E1142 Type 2 diabetes mellitus with diabetic polyneuropathy: Secondary | ICD-10-CM | POA: Insufficient documentation

## 2018-04-30 DIAGNOSIS — G894 Chronic pain syndrome: Secondary | ICD-10-CM | POA: Diagnosis not present

## 2018-04-30 DIAGNOSIS — M4802 Spinal stenosis, cervical region: Secondary | ICD-10-CM | POA: Insufficient documentation

## 2018-04-30 DIAGNOSIS — M533 Sacrococcygeal disorders, not elsewhere classified: Secondary | ICD-10-CM | POA: Insufficient documentation

## 2018-04-30 DIAGNOSIS — Z888 Allergy status to other drugs, medicaments and biological substances status: Secondary | ICD-10-CM | POA: Diagnosis not present

## 2018-04-30 DIAGNOSIS — M4726 Other spondylosis with radiculopathy, lumbar region: Secondary | ICD-10-CM | POA: Insufficient documentation

## 2018-04-30 DIAGNOSIS — Z79891 Long term (current) use of opiate analgesic: Secondary | ICD-10-CM | POA: Insufficient documentation

## 2018-04-30 DIAGNOSIS — I10 Essential (primary) hypertension: Secondary | ICD-10-CM | POA: Diagnosis not present

## 2018-04-30 DIAGNOSIS — N400 Enlarged prostate without lower urinary tract symptoms: Secondary | ICD-10-CM | POA: Diagnosis not present

## 2018-04-30 DIAGNOSIS — Z87891 Personal history of nicotine dependence: Secondary | ICD-10-CM | POA: Insufficient documentation

## 2018-04-30 DIAGNOSIS — E876 Hypokalemia: Secondary | ICD-10-CM | POA: Diagnosis not present

## 2018-04-30 DIAGNOSIS — M069 Rheumatoid arthritis, unspecified: Secondary | ICD-10-CM | POA: Diagnosis not present

## 2018-04-30 DIAGNOSIS — K219 Gastro-esophageal reflux disease without esophagitis: Secondary | ICD-10-CM | POA: Insufficient documentation

## 2018-04-30 DIAGNOSIS — M48061 Spinal stenosis, lumbar region without neurogenic claudication: Secondary | ICD-10-CM | POA: Insufficient documentation

## 2018-04-30 DIAGNOSIS — M75122 Complete rotator cuff tear or rupture of left shoulder, not specified as traumatic: Secondary | ICD-10-CM | POA: Diagnosis not present

## 2018-04-30 DIAGNOSIS — Z5181 Encounter for therapeutic drug level monitoring: Secondary | ICD-10-CM | POA: Diagnosis not present

## 2018-04-30 DIAGNOSIS — Z8249 Family history of ischemic heart disease and other diseases of the circulatory system: Secondary | ICD-10-CM | POA: Diagnosis not present

## 2018-04-30 DIAGNOSIS — G8929 Other chronic pain: Secondary | ICD-10-CM

## 2018-04-30 DIAGNOSIS — Z79899 Other long term (current) drug therapy: Secondary | ICD-10-CM | POA: Insufficient documentation

## 2018-04-30 DIAGNOSIS — Z9049 Acquired absence of other specified parts of digestive tract: Secondary | ICD-10-CM | POA: Diagnosis not present

## 2018-04-30 DIAGNOSIS — Z9889 Other specified postprocedural states: Secondary | ICD-10-CM | POA: Diagnosis not present

## 2018-04-30 DIAGNOSIS — M47817 Spondylosis without myelopathy or radiculopathy, lumbosacral region: Secondary | ICD-10-CM | POA: Diagnosis not present

## 2018-04-30 DIAGNOSIS — E785 Hyperlipidemia, unspecified: Secondary | ICD-10-CM | POA: Diagnosis not present

## 2018-04-30 DIAGNOSIS — M5416 Radiculopathy, lumbar region: Secondary | ICD-10-CM

## 2018-04-30 MED ORDER — OXYCODONE-ACETAMINOPHEN 5-325 MG PO TABS: 1.0000 | ORAL_TABLET | Freq: Four times a day (QID) | ORAL | 0 refills | Status: DC | PRN

## 2018-04-30 NOTE — Progress Notes (Signed)
Nursing Pain Medication Assessment:  Safety precautions to be maintained throughout the outpatient stay will include: orient to surroundings, keep bed in low position, maintain call bell within reach at all times, provide assistance with transfer out of bed and ambulation.   Medication Inspection Compliance: Pill count conducted under aseptic conditions, in front of the patient. Neither the pills nor the bottle was removed from the patient's sight at any time. Once count was completed pills were immediately returned to the patient in their original bottle.  Medication: Oxycodone/APAP Pill/Patch Count: 0 of 120 pills remain Pill/Patch Appearance: Markings consistent with prescribed medication Bottle Appearance: Standard pharmacy container. Clearly labeled. Filled Date: 09 / 20 / 2019 Last Medication intake:  Today

## 2018-04-30 NOTE — Progress Notes (Signed)
Patient's Name: Howard Murphy  MRN: [id number]  Referring Provider: [facility], [place]: 07-Mar-1956  PCP: [facility], [place]: 04/30/2018  Note by: Vevelyn Francois NP  Service setting: Ambulatory outpatient  Specialty: Interventional Pain Management  Location: [facility] (AMB) Pain Management Facility    Patient type: Established    Primary Reason(s) for Visit: Encounter for prescription drug management. (Level of risk: moderate)  CC: No chief complaint on file.  HPI  Howard Murphy is a 62 y.o. year old, male patient, who comes today for a medication management evaluation. He has Benign fibroma of prostate; Elevated hemoglobin (HCC); GERD (gastroesophageal reflux disease); Hypertension; Hyperlipidemia; History of lumbar facet Synovial cyst, surgically removed.; RA (rheumatoid arthritis) ([place]); SOB (shortness of breath); Chronic abdominal pain (RUQ); Type 2 diabetes mellitus with peripheral neuropathy ([place]); Chest pain; Hypertriglyceridemia; Opiate use (60 MME/Day); Long term prescription opiate use; Long term current use of opiate analgesic; Spondylosis of lumbar spine; Lumbar facet joint syndrome ([place]); Chronic hip pain (Left); Diabetic peripheral neuropathy ([place]); Chronic lumbar radicular pain (S1 Dermatome) (Bilateral) (L>R); Hypokalemia; Complete tear of the shoulder rotator cuff (Left); Chronic shoulder pain (Primary Source of Pain) (Bilateral) (L>R); Diabetes ([place]); Chronic foot pain (Secondary source of pain) (Bilateral) (L>R); Chronic hand pain [facility] source of pain) (Bilateral) (R>L); Lumbar facet hypertrophy; Encounter for therapeutic drug level monitoring; Low testosterone; Occipital pain (Right); Failed back surgical syndrome 4; Chronic low back pain (Primary Source of Pain) (Bilateral) (L>R); Radicular pain of shoulder (Right); Carpal tunnel syndrome (Bilateral); Chronic cervical radicular pain (Right); Abnormal MRI, lumbar spine; Chronic sacroiliac joint pain  (Bilateral) (L>R); Neurogenic pain; Cervical spondylosis; Cervical facet arthropathy; Chronic neck pain (Bilateral) (R>L); Cervical facet syndrome (Bilateral) (R>L); Chronic upper extremity pain (Bilateral) (R>L); Cervical foraminal stenosis;  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, C5-6); Arthropathy of shoulder (Right); Osteoarthritis; 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); Lumbar foraminal stenosis (L4-5) (Left); Grade 1 Anterolisthesis (2 mm) of L4 over L5.; Lumbar facet (L4-5) synovial cyst (6 mm) (Left); Encounter for medication monitoring; Chronic pain syndrome; Spondylolisthesis at L3-L4 level; Benign prostatic hyperplasia; Complete tear of left rotator cuff; Bertolotti's syndrome (L5-S1) (Bilateral); 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); Medication monitoring encounter; Acute appendicitis; Other specified dorsopathies, sacral and sacrococcygeal region; Spondylosis without myelopathy or radiculopathy, lumbosacral region; Chronic bilateral low back pain with bilateral sciatica; and Neurogenic claudication on their problem list. His primarily concern today is the No chief complaint on file.  Pain Assessment: Location: Lower Back Radiating: Denies Onset: More than a month ago Duration: Chronic pain Quality: Stabbing, Constant Severity: 5 /10 (subjective, self-reported pain score)  Note: Reported level is compatible with observation.                         When using our objective Pain Scale, levels between 6 and 10/10 are said to belong in an emergency room, as it progressively worsens from a 6/10, described as severely limiting, requiring emergency care not usually available at an outpatient pain management facility. At a 6/10 level, communication becomes difficult and requires great effort. Assistance to reach the emergency department may be required. Facial flushing and profuse sweating along with potentially dangerous increases in heart rate and blood pressure will be  evident. Effect on ADL: "Right now unable to do nothing" Patient is post op 2 weeks right now  Timing: Constant Modifying factors: Oxycodone BP: 109/77  HR: 95  Howard Murphy was last scheduled for an appointment on 01/28/2018  for medication management. During today's appointment we reviewed Howard Murphy's chronic pain status, as well as his outpatient medication regimen. He is SP lumbar fusion. He admits that he is doing better. He is now able to walk with his cane. He admits that prior to the surgery this was difficult without severe leg pain. He is no longer having leg pain.   The patient  reports that he does not use drugs. His body mass index is 28.51 kg/m.  Further details on both, my assessment(s), as well as the proposed treatment plan, please see below.  Controlled Substance Pharmacotherapy Assessment REMS (Risk Evaluation and Mitigation Strategy)  Analgesic:Oxycodone IR 10 mg 1 tablet by mouth 4 times a day (40 mg/dayof oxycodone) (60 MME/Day) MME/day:54m/day. RJanne Napoleon RN  04/30/2018  9:23 AM  Sign at close encounter Nursing Pain Medication Assessment:  Safety precautions to be maintained throughout the outpatient stay will include: orient to surroundings, keep bed in low position, maintain call bell within reach at all times, provide assistance with transfer out of bed and ambulation.   Medication Inspection Compliance: Pill count conducted under aseptic conditions, in front of the patient. Neither the pills nor the bottle was removed from the patient's sight at any time. Once count was completed pills were immediately returned to the patient in their original bottle.  Medication: Oxycodone/APAP Pill/Patch Count: 0 of 120 pills remain Pill/Patch Appearance: Markings consistent with prescribed medication Bottle Appearance: Standard pharmacy container. Clearly labeled. Filled Date: 09 / 20 / 2019 Last Medication intake:  Today   Pharmacokinetics: Liberation and  absorption (onset of action): WNL Distribution (time to peak effect): WNL Metabolism and excretion (duration of action): WNL         Pharmacodynamics: Desired effects: Analgesia: Howard Murphy >50% benefit. Functional ability: Patient reports that medication allows him to accomplish basic ADLs Clinically meaningful improvement in function (CMIF): Sustained CMIF goals met Perceived effectiveness: Described as relatively effective, allowing for increase in activities of daily living (ADL) Undesirable effects: Side-effects or Adverse reactions: None reported Monitoring: [place] PMP: Online review of the past 13-montheriod conducted. Compliant with practice rules and regulations Last UDS on record: Summary  Date Value Ref Range Status  10/31/2017 FINAL  Final    Comment:    ==================================================================== TOXASSURE SELECT 13 (MW) ==================================================================== Test                             Result       Flag       Units Drug Present and Declared for Prescription Verification   Oxycodone                      342          EXPECTED   ng/mg creat   Oxymorphone                    147          EXPECTED   ng/mg creat   Noroxycodone                   734          EXPECTED   ng/mg creat    Sources of oxycodone include scheduled prescription medications.    Oxymorphone and noroxycodone are expected metabolites of    oxycodone. Oxymorphone is also available as a scheduled    prescription medication. ==================================================================== Test  Result    Flag   Units      Ref Range   Creatinine              98               mg/dL      >=20 ==================================================================== Declared Medications:  The flagging and interpretation on this report are based on the  following declared medications.  Unexpected results may arise from   inaccuracies in the declared medications.  **Note: The testing scope of this panel includes these medications:  Oxycodone  **Note: The testing scope of this panel does not include following  reported medications:  Acetaminophen (Tylenol)  Albuterol (Proventil)  Atorvastatin (Lipitor)  Chlorthalidone  Gabapentin  Lisinopril  Potassium  Pregabalin (Lyrica)  Ranitidine  Sitagliptin (Januvia)  Tamsulosin (Flomax) ==================================================================== For clinical consultation, please call [phone]. ====================================================================    UDS interpretation: Compliant          Medication Assessment Form: Reviewed. Patient indicates being compliant with therapy Treatment compliance: Compliant Risk Assessment Profile: Aberrant behavior: See prior evaluations. None observed or detected today Comorbid factors increasing risk of overdose: See prior notes. No additional risks detected today Opioid risk tool (ORT) (Total Score): 0 Personal History of Substance Abuse (SUD-Substance use disorder):  Alcohol: Negative  Illegal Drugs: Negative  Rx Drugs: Negative  ORT Risk Level calculation: Low Risk Risk of substance use disorder (SUD): Low Opioid Risk Tool - 04/30/18 0921      Family History of Substance Abuse   Alcohol  Negative    Illegal Drugs  Negative    Rx Drugs  Negative      Personal History of Substance Abuse   Alcohol  Negative    Illegal Drugs  Negative    Rx Drugs  Negative      History of Preadolescent Sexual Abuse   History of Preadolescent Sexual Abuse  Negative or Male      Psychological Disease   Psychological Disease  Negative    Depression  Negative      Total Score   Opioid Risk Tool Scoring  0    Opioid Risk Interpretation  Low Risk      ORT Scoring interpretation table:  Score <3 = Low Risk for SUD  Score between 4-7 = Moderate Risk for SUD  Score >8 = High Risk for Opioid Abuse    Risk Mitigation Strategies:  Patient Counseling: Covered Patient-Prescriber Agreement (PPA): Present and active  Notification to other healthcare providers: Done  Pharmacologic Plan: No change in therapy, at this time.             Laboratory Chemistry  Inflammation Markers (CRP: Acute Phase) (ESR: Chronic Phase) Lab Results  Component Value Date   CRP 1.8 11/29/2016   ESRSEDRATE 11 11/29/2016                         Rheumatology Markers No results found for: RF, ANA, LABURIC, URICUR, LYMEIGGIGMAB, LYMEABIGMQN, HLAB27                      Renal Function Markers Lab Results  Component Value Date   BUN 16 03/25/2018   CREATININE 1.04 03/25/2018   BCR 14 03/07/2017   GFRAA >60 03/25/2018   GFRNONAA >60 03/25/2018  Hepatic Function Markers Lab Results  Component Value Date   AST 31 12/28/2017   ALT 31 12/28/2017   ALBUMIN 4.0 12/28/2017   ALKPHOS 44 12/28/2017   LIPASE 100 (H) 12/28/2017                        Electrolytes Lab Results  Component Value Date   NA 140 04/02/2018   K 3.2 (L) 04/02/2018   CL 98 03/25/2018   CALCIUM 9.5 03/25/2018   MG 2.0 11/29/2016                        Neuropathy Markers Lab Results  Component Value Date   BPZWCHEN27 782 11/29/2016   HGBA1C 6.9 (H) 03/07/2017   HIV Non Reactive 12/18/2017                        CNS Tests No results found for: COLORCSF, APPEARCSF, RBCCOUNTCSF, WBCCSF, POLYSCSF, LYMPHSCSF, EOSCSF, PROTEINCSF, GLUCCSF, JCVIRUS, CSFOLI, IGGCSF                      Bone Pathology Markers Lab Results  Component Value Date   25OHVITD1 24 (L) 11/29/2016   25OHVITD2 <1.0 11/29/2016   25OHVITD3 24 11/29/2016                         Coagulation Parameters Lab Results  Component Value Date   INR 1.00 03/25/2018   LABPROT 13.1 03/25/2018   APTT 25 03/25/2018   PLT 138 (L) 04/04/2018                        Cardiovascular Markers Lab Results  Component Value Date   TROPONINI  <0.03 02/18/2017   HGB 11.3 (L) 04/04/2018   HCT 33.4 (L) 04/04/2018                         CA Markers No results found for: CEA, CA125, LABCA2                      Note: Lab results reviewed.  Recent Diagnostic Imaging Results  DG Lumbar Spine 2-3 Views CLINICAL DATA:  Status post surgical posterior fusion.  EXAM: LUMBAR SPINE - 2-3 VIEW  COMPARISON:  Fluoroscopic images of April 02, 2018. CT scan of December 29, 2017.  FINDINGS: Status post surgical posterior fusion extending from L2-S2 with bilateral intrapedicular screw placement. Interbody fusion of L2-3, L3-4 and L4-5 is noted. Good alignment vertebral bodies is noted.  IMPRESSION: Status post surgical posterior fusion as described above.  Electronically Signed   By: Marijo Conception, M.D.   On: 04/04/2018 11:07  Complexity Note: Imaging results reviewed. Results shared with Howard Murphy, using [employer].                         Meds   Current Outpatient Medications:  .  acetaminophen (TYLENOL) 500 MG tablet, Take 1,000 mg by mouth every 6 (six) hours as needed for mild pain. , Disp: , Rfl:  .  albuterol (PROVENTIL HFA;VENTOLIN HFA) 108 (90 Base) MCG/ACT inhaler, Inhale 2 puffs into the lungs every 6 (six) hours as needed for wheezing or shortness of breath., Disp: 1 Inhaler, Rfl: 2 .  atorvastatin (LIPITOR) 40 MG tablet, Take 1 tablet (40 mg  total) by mouth at bedtime., Disp: 30 tablet, Rfl: 5 .  celecoxib (CELEBREX) 100 MG capsule, Take 1 capsule (100 mg total) by mouth 2 (two) times daily., Disp: 60 capsule, Rfl: 0 .  chlorthalidone (HYGROTON) 50 MG tablet, Take 50 mg by mouth daily., Disp: , Rfl:  .  gabapentin (NEURONTIN) 300 MG capsule, Take 300 mg by mouth daily as needed (pain). , Disp: , Rfl:  .  meclizine (ANTIVERT) 25 MG tablet, Take 25 mg by mouth every 6 (six) hours as needed for dizziness., Disp: , Rfl:  .  methocarbamol (ROBAXIN) 500 MG tablet, Take 2 tablets (1,000 mg total) by mouth every 6 (six)  hours as needed for muscle spasms., Disp: 60 tablet, Rfl: 0 .  ondansetron (ZOFRAN ODT) 4 MG disintegrating tablet, Take 1 tablet (4 mg total) by mouth every 8 (eight) hours as needed for nausea or vomiting., Disp: 20 tablet, Rfl: 0 .  ondansetron (ZOFRAN ODT) 4 MG disintegrating tablet, Take 1 tablet (4 mg total) by mouth every 8 (eight) hours as needed for nausea or vomiting., Disp: 20 tablet, Rfl: 0 .  oxyCODONE (OXY IR/ROXICODONE) 5 MG immediate release tablet, Take 1-2 tablets (5-10 mg total) by mouth every 3 (three) hours as needed for severe pain or breakthrough pain., Disp: 30 tablet, Rfl: 0 .  [START ON 07/06/2018] oxyCODONE-acetaminophen (PERCOCET) 5-325 MG tablet, Take 1 tablet by mouth every 6 (six) hours as needed for severe pain. Must last 30 days., Disp: 120 tablet, Rfl: 0 .  potassium chloride (K-DUR) 10 MEQ tablet, Take 1 tablet (10 mEq total) by mouth daily. (Patient taking differently: Take 10 mEq by mouth daily as needed (For low potassium when instructred by his doctor.). ), Disp: 7 tablet, Rfl: 0 .  ranitidine (ZANTAC) 150 MG tablet, Take 150 mg by mouth daily as needed for heartburn. , Disp: , Rfl:  .  sitaGLIPtin (JANUVIA) 50 MG tablet, Take 1 tablet (50 mg total) by mouth daily., Disp: 30 tablet, Rfl: 5 .  tamsulosin (FLOMAX) 0.4 MG CAPS capsule, Take 1 capsule (0.4 mg total) by mouth daily., Disp: 90 capsule, Rfl: 3 .  testosterone cypionate (DEPOTESTOSTERONE CYPIONATE) 200 MG/ML injection, Inject 200 mg into the muscle every 14 (fourteen) days. , Disp: , Rfl: 2 .  tiZANidine (ZANAFLEX) 2 MG tablet, , Disp: , Rfl: 0 .  [START ON 06/06/2018] oxyCODONE-acetaminophen (PERCOCET) 5-325 MG tablet, Take 1 tablet by mouth every 6 (six) hours as needed for severe pain., Disp: 120 tablet, Rfl: 0 .  [START ON 05/07/2018] oxyCODONE-acetaminophen (PERCOCET) 5-325 MG tablet, Take 1 tablet by mouth every 6 (six) hours as needed for severe pain., Disp: 120 tablet, Rfl: 0  ROS  Constitutional:  Denies any fever or chills Gastrointestinal: No reported hemesis, hematochezia, vomiting, or acute GI distress Musculoskeletal: Denies any acute onset joint swelling, redness, loss of ROM, or weakness Neurological: No reported episodes of acute onset apraxia, aphasia, dysarthria, agnosia, amnesia, paralysis, loss of coordination, or loss of consciousness  Allergies  Howard Murphy is allergic to adhesive [tape].  [place]  Drug: Howard Murphy  reports that he does not use drugs. Alcohol:  reports that he does not drink alcohol. Tobacco:  reports that he has never smoked. He quit smokeless tobacco use about 5 years ago.  His smokeless tobacco use included chew. Medical:  has a past medical history of Arthritis, Back ache (05/05/2013), Diabetes mellitus without complication ([place]), GERD (gastroesophageal reflux disease), Headache, History of kidney stones, Hyperlipemia, Hypertension, Kidney stones, Kidney  stones, Nephrolithiasis, and Shortness of breath dyspnea. Surgical: Howard Murphy  has a past surgical history that includes Cholecystectomy (2000); Shoulder surgery (Right, 2016); Colonoscopy with propofol (N/A, 04/11/2015); Esophagogastroduodenoscopy (egd) with propofol (N/A, 04/11/2015); Shoulder surgery (Left, 06/2015); Back surgery (2015); Back surgery (08/09/2016); Cystoscopy with ureteroscopy and stent placement (Right, 08/27/2017); laparoscopic appendectomy (N/A, 12/18/2017); and Appendectomy. Family: family history includes Cancer in his maternal grandmother, mother, sister, and sister; Heart disease in his father; Stroke in his father.  Constitutional Exam  General appearance: Well nourished, well developed, and well hydrated. In no apparent acute distress Vitals:   04/30/18 0913  BP: 109/77  Pulse: 95  Temp: 97.6 F (36.4 C)  SpO2: 97%  Weight: 182 lb (82.6 kg)  Height: 5' 7" (1.702 m)  Psych/Mental status: Alert, oriented x 3 (person, place, & time)       Eyes: PERLA Respiratory: No  evidence of acute respiratory distress  Lumbar Spine Area Exam  Skin & Axial Inspection: healing scar from previous spine surgery detected Alignment: Symmetrical Functional ROM: Unrestricted ROM       Stability: No instability detected Muscle Tone/Strength: Functionally intact. No obvious neuro-muscular anomalies detected. Sensory (Neurological): Unimpaired Palpation: No palpable anomalies        Gait & Posture Assessment  Ambulation: Patient ambulates using a cane Gait: Relatively normal for age and body habitus Posture: WNL   Lower Extremity Exam    Side: Right lower extremity  Side: Left lower extremity  Stability: No instability observed          Stability: No instability observed          Skin & Extremity Inspection: Skin color, temperature, and hair growth are WNL. No peripheral edema or cyanosis. No masses, redness, swelling, asymmetry, or associated skin lesions. No contractures.  Skin & Extremity Inspection: Skin color, temperature, and hair growth are WNL. No peripheral edema or cyanosis. No masses, redness, swelling, asymmetry, or associated skin lesions. No contractures.  Functional ROM: Unrestricted ROM                  Functional ROM: Unrestricted ROM                  Palpation: No palpable anomalies  Palpation: No palpable anomalies   Assessment  Primary Diagnosis & Pertinent Problem List: The primary encounter diagnosis was Spondylosis without myelopathy or radiculopathy, lumbosacral region. Diagnoses of Chronic pain syndrome, Chronic lumbar radicular pain (S1 Dermatome) (Bilateral) (L>R), Chronic sacroiliac joint pain (Bilateral) (L>R), and Long term prescription opiate use were also pertinent to this visit.  Status Diagnosis  Controlled Controlled Resolved 1. Spondylosis without myelopathy or radiculopathy, lumbosacral region   2. Chronic pain syndrome   3. Chronic lumbar radicular pain (S1 Dermatome) (Bilateral) (L>R)   4. Chronic sacroiliac joint pain  (Bilateral) (L>R)   5. Long term prescription opiate use     Problems updated and reviewed during this visit: No problems updated. Plan of Care  Pharmacotherapy (Medications Ordered): Meds ordered this encounter  Medications  . oxyCODONE-acetaminophen (PERCOCET) 5-325 MG tablet    Sig: Take 1 tablet by mouth every 6 (six) hours as needed for severe pain. Must last 30 days.    Dispense:  120 tablet    Refill:  0    Do not place this medication, or any other prescription from our practice, on "Automatic Refill". Patient may have prescription filled one day early if pharmacy is closed on scheduled refill date.    Order Specific Question:  Supervising Provider    Answer:   Milinda Pointer [phone]  . oxyCODONE-acetaminophen (PERCOCET) 5-325 MG tablet    Sig: Take 1 tablet by mouth every 6 (six) hours as needed for severe pain.    Dispense:  120 tablet    Refill:  0    Do not place this medication, or any other prescription from our practice, on "Automatic Refill". Patient may have prescription filled one day early if pharmacy is closed on scheduled refill date.    Order Specific Question:   Supervising Provider    Answer:   Milinda Pointer [phone]  . oxyCODONE-acetaminophen (PERCOCET) 5-325 MG tablet    Sig: Take 1 tablet by mouth every 6 (six) hours as needed for severe pain.    Dispense:  120 tablet    Refill:  0    Do not place this medication, or any other prescription from our practice, on "Automatic Refill". Patient may have prescription filled one day early if pharmacy is closed on scheduled refill date.    Order Specific Question:   Supervising Provider    Answer:   Milinda Pointer [[id number]]   New Prescriptions   No medications on file   Medications administered today: Arney Mayabb. [place] had no medications administered during this visit. Lab-work, procedure(s), and/or referral(s): Orders Placed This Encounter  Procedures  . ToxASSURE Select 13 (MW), Urine    Imaging and/or referral(s): None  Interventional management options: Planned, scheduled, and/or pending:   Not at this time.    Considering:   Diagnostic bilateral cervical facet block Possible bilateral cervical facet radiofrequency ablation  Diagnostic right-sided greater occipital nerve block Possible right-sided greater occipital nerve radiofrequencyablation  Possible right-sided peripheral nerve stimulator trial  Diagnostic right-sided cervical epidural steroid injection  Diagnostic bilateral intra-articular shoulder injection Diagnostic bilateral suprascapular nerve block Possible bilateral suprascapular nerve radiofrequencyablation  Diagnostic bilateral sacroiliac joint block Possible bilateral sacroiliac joint radiofrequencyablation  Diagnostic bilateral lumbar facet block Possible bilateral lumbar facet radiofrequencyablation  Diagnostic left L4-5 transforaminal epiduralsteroid injection  Diagnostic left-sided L4-5 lumbar epidural steroid injection  Diagnostic left intra-articular hip joint injection Possible left hip joint radiofrequencyablation  Diagnostic bilateral median nerve block(carpal tunnel block)  Diagnostic right-sided celiac plexus block   Palliative PRN treatment(s):   None at this time    Provider-requested follow-up: Return in about 3 months (around 07/31/2018) for MedMgmt.  Future Appointments  Date Time Provider [place]  07/31/2018  9:30 AM Vevelyn Francois, NP [facility] None   Primary Care Physician: [facility], [place] Location: [facility] Outpatient Pain Management Facility Note by: Vevelyn Francois NP Date: 04/30/2018; Time: 10:43 AM  Pain Score Disclaimer: We use the NRS-11 scale. This is a self-reported, subjective measurement of pain severity with only modest accuracy. It is used primarily to identify changes within a particular patient. It must be understood that outpatient pain scales are significantly  less accurate that those used for research, where they can be applied under ideal controlled circumstances with minimal exposure to variables. In reality, the score is likely to be a combination of pain intensity and pain affect, where pain affect describes the degree of emotional arousal or changes in action readiness caused by the sensory experience of pain. Factors such as social and work situation, setting, emotional state, anxiety levels, expectation, and prior pain experience may influence pain perception and show large inter-individual differences that may also be affected by time variables.  Patient instructions provided during this appointment: Patient Instructions   ____________________________________________________________________________________________  Medication  Rules  Purpose: To inform patients, and their family members, of our rules and regulations.  Applies to: All patients receiving prescriptions (written or electronic).  Pharmacy of record: Pharmacy where electronic prescriptions will be sent. If written prescriptions are taken to a different pharmacy, please inform the nursing staff. The pharmacy listed in the electronic medical record should be the one where you would like electronic prescriptions to be sent.  Electronic prescriptions: In compliance with the [place] (STOP) Act of 2017 (Session Lanny Cramp [phone]), effective June 11, 2018, all controlled substances must be electronically prescribed. Calling prescriptions to the pharmacy will cease to exist.  Prescription refills: Only during scheduled appointments. Applies to all prescriptions.  NOTE: The following applies primarily to controlled substances (Opioid* Pain Medications).   Patient's responsibilities: 1. Pain Pills: Bring all pain pills to every appointment (except for procedure appointments). 2. Pill Bottles: Bring pills in original pharmacy bottle. Always bring  the newest bottle. Bring bottle, even if empty. 3. Medication refills: You are responsible for knowing and keeping track of what medications you take and those you need refilled. The day before your appointment: write a list of all prescriptions that need to be refilled. The day of the appointment: give the list to the admitting nurse. Prescriptions will be written only during appointments. If you forget a medication: it will not be "Called in", "Faxed", or "electronically sent". You will need to get another appointment to get these prescribed. No early refills. Do not call asking to have your prescription filled early. 4. Prescription Accuracy: You are responsible for carefully inspecting your prescriptions before leaving our office. Have the discharge nurse carefully go over each prescription with you, before taking them home. Make sure that your name is accurately spelled, that your address is correct. Check the name and dose of your medication to make sure it is accurate. Check the number of pills, and the written instructions to make sure they are clear and accurate. Make sure that you are given enough medication to last until your next medication refill appointment. 5. Taking Medication: Take medication as prescribed. When it comes to controlled substances, taking less pills or less frequently than prescribed is permitted and encouraged. Never take more pills than instructed. Never take medication more frequently than prescribed.  6. Inform other Doctors: Always inform, all of your healthcare providers, of all the medications you take. 7. Pain Medication from other Providers: You are not allowed to accept any additional pain medication from any other Doctor or Healthcare provider. There are two exceptions to this rule. (see below) In the event that you require additional pain medication, you are responsible for notifying us, as stated below. 8. Medication Agreement: You are responsible for  carefully reading and following our Medication Agreement. This must be signed before receiving any prescriptions from our practice. Safely store a copy of your signed Agreement. Violations to the Agreement will result in no further prescriptions. (Additional copies of our Medication Agreement are available upon request.) 9. Laws, Rules, & Regulations: All patients are expected to follow all Federal and [employer], [employer], Rules, [employer] [facility] [employer]. Ignorance of the Laws does not constitute a valid excuse. The use of any illegal substances is prohibited. 10. Adopted CDC guidelines & recommendations: Target dosing levels will be at or below 60 MME/day. Use of benzodiazepines** is not recommended.  Exceptions: There are only two exceptions to the rule of not receiving pain medications from other Healthcare Providers. 1. Exception #1 (Emergencies):  In the event of an emergency (i.e.: accident requiring emergency care), you are allowed to receive additional pain medication. However, you are responsible for: As soon as you are able, call our office [phone], at any time of the day or night, and leave a message stating your name, the date and nature of the emergency, and the name and dose of the medication prescribed. In the event that your call is answered by a member of our staff, make sure to document and save the date, time, and the name of the person that took your information.  2. Exception #2 (Planned Surgery): In the event that you are scheduled by another doctor or dentist to have any type of surgery or procedure, you are allowed (for a period no longer than 30 days), to receive additional pain medication, for the acute post-op pain. However, in this case, you are responsible for picking up a copy of our "Post-op Pain Management for Surgeons" handout, and giving it to your surgeon or dentist. This document is available at our office, and does not require an appointment to obtain it. Simply go to our  office during business hours (Monday-Thursday from 8:00 AM to 4:00 PM) (Friday 8:00 AM to 12:00 Noon) or if you have a scheduled appointment with [place], prior to your surgery, and ask for it by name. In addition, you will need to provide [place] with your name, name of your surgeon, type of surgery, and date of procedure or surgery.  *Opioid medications include: morphine, codeine, oxycodone, oxymorphone, hydrocodone, hydromorphone, meperidine, tramadol, tapentadol, buprenorphine, fentanyl, methadone. **Benzodiazepine medications include: diazepam (Valium), alprazolam (Xanax), clonazepam (Klonopine), lorazepam (Ativan), clorazepate (Tranxene), chlordiazepoxide (Librium), estazolam (Prosom), oxazepam (Serax), temazepam (Restoril), triazolam (Halcion) (Last updated: 08/08/2017) ____________________________________________________________________________________________     BMI Assessment: Estimated body mass index is 28.51 kg/m as calculated from the following:   Height as of this encounter: 5' 7" (1.702 m).   Weight as of this encounter: 182 lb (82.6 kg).  BMI interpretation table: BMI level Category Range association with higher incidence of chronic pain  <18 kg/m2 Underweight   18.5-24.9 kg/m2 Ideal body weight   25-29.9 kg/m2 Overweight Increased incidence by 20%  30-34.9 kg/m2 Obese (Class I) Increased incidence by 68%  35-39.9 kg/m2 Severe obesity (Class II) Increased incidence by 136%  >40 kg/m2 Extreme obesity (Class III) Increased incidence by 254%   Patient's current BMI Ideal Body weight  Body mass index is 28.51 kg/m. Ideal body weight: 66.1 kg (145 lb 11.6 oz) Adjusted ideal body weight: 72.7 kg (160 lb 3.8 oz)   BMI Readings from Last 4 Encounters:  04/30/18 28.51 kg/m  04/02/18 29.34 kg/m  03/25/18 29.32 kg/m  02/06/18 30.54 kg/m   Wt Readings from Last 4 Encounters:  04/30/18 182 lb (82.6 kg)  04/02/18 187 lb 4.8 oz (85 kg)  03/25/18 187 lb 3 oz (84.9 kg)  02/06/18 195  lb (88.5 kg)    Oxycodone - apap 5 - 325 mg x 3 months to begin filling on 05/07/18, 06/06/18, and 07/06/18 escribed to your pharmacy.  UDS today

## 2018-04-30 NOTE — Patient Instructions (Addendum)
____________________________________________________________________________________________  Medication Rules  Purpose: To inform patients, and their family members, of our rules and regulations.  Applies to: All patients receiving prescriptions (written or electronic).  Pharmacy of record: Pharmacy where electronic prescriptions will be sent. If written prescriptions are taken to a different pharmacy, please inform the nursing staff. The pharmacy listed in the electronic medical record should be the one where you would like electronic prescriptions to be sent.  Electronic prescriptions: In compliance with the [place] [employer] Opioid Misuse Prevention (STOP) Act of 2017 ([place] Law 2017-74/H243), effective June 11, 2018, all controlled substances must be electronically prescribed. Calling prescriptions to the pharmacy will cease to exist.  Prescription refills: Only during scheduled appointments. Applies to all prescriptions.  NOTE: The following applies primarily to controlled substances (Opioid* Pain Medications).   Patient's responsibilities: 1. Pain Pills: Bring all pain pills to every appointment (except for procedure appointments). 2. Pill Bottles: Bring pills in original pharmacy bottle. Always bring the newest bottle. Bring bottle, even if empty. 3. Medication refills: You are responsible for knowing and keeping track of what medications you take and those you need refilled. The day before your appointment: write a list of all prescriptions that need to be refilled. The day of the appointment: give the list to the admitting nurse. Prescriptions will be written only during appointments. If you forget a medication: it will not be "Called in", "Faxed", or "electronically sent". You will need to get another appointment to get these prescribed. No early refills. Do not call asking to have your prescription filled early. 4. Prescription Accuracy: You are responsible for  carefully inspecting your prescriptions before leaving our office. Have the discharge nurse carefully go over each prescription with you, before taking them home. Make sure that your name is accurately spelled, that your address is correct. Check the name and dose of your medication to make sure it is accurate. Check the number of pills, and the written instructions to make sure they are clear and accurate. Make sure that you are given enough medication to last until your next medication refill appointment. 5. Taking Medication: Take medication as prescribed. When it comes to controlled substances, taking less pills or less frequently than prescribed is permitted and encouraged. Never take more pills than instructed. Never take medication more frequently than prescribed.  6. Inform other Doctors: Always inform, all of your healthcare providers, of all the medications you take. 7. Pain Medication from other Providers: You are not allowed to accept any additional pain medication from any other Doctor or Healthcare provider. There are two exceptions to this rule. (see below) In the event that you require additional pain medication, you are responsible for notifying us, as stated below. 8. Medication Agreement: You are responsible for carefully reading and following our Medication Agreement. This must be signed before receiving any prescriptions from our practice. Safely store a copy of your signed Agreement. Violations to the Agreement will result in no further prescriptions. (Additional copies of our Medication Agreement are available upon request.) 9. Laws, Rules, & Regulations: All patients are expected to follow all Federal and State Laws, Statutes, Rules, & Regulations. Ignorance of the Laws does not constitute a valid excuse. The use of any illegal substances is prohibited. 10. Adopted CDC guidelines & recommendations: Target dosing levels will be at or below 60 MME/day. Use of benzodiazepines** is not  recommended.  Exceptions: There are only two exceptions to the rule of not receiving pain medications from other Healthcare Providers. 1.   Exception #1 (Emergencies): In the event of an emergency (i.e.: accident requiring emergency care), you are allowed to receive additional pain medication. However, you are responsible for: As soon as you are able, call our office [phone], at any time of the day or night, and leave a message stating your name, the date and nature of the emergency, and the name and dose of the medication prescribed. In the event that your call is answered by a member of our staff, make sure to document and save the date, time, and the name of the person that took your information.  2. Exception #2 (Planned Surgery): In the event that you are scheduled by another doctor or dentist to have any type of surgery or procedure, you are allowed (for a period no longer than 30 days), to receive additional pain medication, for the acute post-op pain. However, in this case, you are responsible for picking up a copy of our "Post-op Pain Management for Surgeons" handout, and giving it to your surgeon or dentist. This document is available at our office, and does not require an appointment to obtain it. Simply go to our office during business hours (Monday-Thursday from 8:00 AM to 4:00 PM) (Friday 8:00 AM to 12:00 Noon) or if you have a scheduled appointment with [place], prior to your surgery, and ask for it by name. In addition, you will need to provide [place] with your name, name of your surgeon, type of surgery, and date of procedure or surgery.  *Opioid medications include: morphine, codeine, oxycodone, oxymorphone, hydrocodone, hydromorphone, meperidine, tramadol, tapentadol, buprenorphine, fentanyl, methadone. **Benzodiazepine medications include: diazepam (Valium), alprazolam (Xanax), clonazepam (Klonopine), lorazepam (Ativan), clorazepate (Tranxene), chlordiazepoxide (Librium), estazolam (Prosom),  oxazepam (Serax), temazepam (Restoril), triazolam (Halcion) (Last updated: 08/08/2017) ____________________________________________________________________________________________     BMI Assessment: Estimated body mass index is 28.51 kg/m as calculated from the following:   Height as of this encounter: 5\' 7"  (1.702 m).   Weight as of this encounter: 182 lb (82.6 kg).  BMI interpretation table: BMI level Category Range association with higher incidence of chronic pain  <18 kg/m2 Underweight   18.5-24.9 kg/m2 Ideal body weight   25-29.9 kg/m2 Overweight Increased incidence by 20%  30-34.9 kg/m2 Obese (Class I) Increased incidence by 68%  35-39.9 kg/m2 Severe obesity (Class II) Increased incidence by 136%  >40 kg/m2 Extreme obesity (Class III) Increased incidence by 254%   Patient's current BMI Ideal Body weight  Body mass index is 28.51 kg/m. Ideal body weight: 66.1 kg (145 lb 11.6 oz) Adjusted ideal body weight: 72.7 kg (160 lb 3.8 oz)   BMI Readings from Last 4 Encounters:  04/30/18 28.51 kg/m  04/02/18 29.34 kg/m  03/25/18 29.32 kg/m  02/06/18 30.54 kg/m   Wt Readings from Last 4 Encounters:  04/30/18 182 lb (82.6 kg)  04/02/18 187 lb 4.8 oz (85 kg)  03/25/18 187 lb 3 oz (84.9 kg)  02/06/18 195 lb (88.5 kg)    Oxycodone - apap 5 - 325 mg x 3 months to begin filling on 05/07/18, 06/06/18, and 07/06/18 escribed to your pharmacy.  UDS today

## 2018-05-05 LAB — TOXASSURE SELECT 13 (MW), URINE

## 2018-05-07 NOTE — Progress Notes (Signed)
PMP negative for Benzos

## 2018-05-29 DIAGNOSIS — Z981 Arthrodesis status: Secondary | ICD-10-CM | POA: Diagnosis not present

## 2018-05-29 DIAGNOSIS — M48062 Spinal stenosis, lumbar region with neurogenic claudication: Secondary | ICD-10-CM | POA: Diagnosis not present

## 2018-05-29 DIAGNOSIS — M47816 Spondylosis without myelopathy or radiculopathy, lumbar region: Secondary | ICD-10-CM | POA: Diagnosis not present

## 2018-07-02 DIAGNOSIS — J029 Acute pharyngitis, unspecified: Secondary | ICD-10-CM | POA: Diagnosis not present

## 2018-07-31 ENCOUNTER — Encounter: Payer: Medicare Other | Admitting: Nurse Practitioner

## 2018-08-05 ENCOUNTER — Ambulatory Visit: Payer: Medicare Other | Admitting: Urology

## 2018-08-05 NOTE — Progress Notes (Incomplete)
08/05/2018 10:31 AM   Howard Murphy 07-16-1955 [id number]  Referring provider: Center, [facility] [place], [place] [address]  No chief complaint on file.   HPI: Howard Murphy is a 63 y.o. male Caucasian with nephrolithiasis who presents today for a routine follow up.  He presented to the ED on 08/12/2017 with the complaint of gross hematuria and right flank pain that radiated to the right testicle.  His UA was positive for TNTC RBC's.  His WBC count was 10.0.  His creatinine was 0.84.  His potassium was found to be 2.6.  He states they gave him two potassium pills.    CT Renal stone study performed on 08/12/2017 noted adrenal glands are normal.  On the right, there is a 6 mm stone positioned within the right renal pelvis (series 2, image 37). Associated mild pelviectasis, suggesting that this be at least partially obstructive. Additional punctate nonobstructive calculus within the upper pole the right kidney. Right ureter decompressed distally with no other calculi Identified.  Faint punctate nonobstructive calculi present within the left kidney. No hydronephrosis. No radiopaque calculi seen along the course of the left renal collecting system. No left-sided hydroureter.  Partially distended bladder within normal limits. No layering stones within the bladder lumen.  On 08/15/2017, he was having right flank pain.  He stated that it began one month ago.  8/10 pain.  Nothing helped the pain.  Nothing made the pain worse.  He was having associated frequency, urgency, intermittency, straining to urinate, gross hematuria and a weak urinary stream.  Patient denied any dysuria.  Patient was having nausea, but he denied any fevers, chills or vomiting.  His UA was positive for 3-10 RBC's.    He does have a prior history of stones.  Stone composition is unknown.    In 03/19, he underwent a right ureteroscopy.  His renal pelvic calculus had migrated to the lower  pole.  He underwent basketing of both calculi; the stones were crystalline and broke up easily with the basket.  He had a ureteral stent placed, which was removed on 09/02/2017.  He stated that he had persistent pain while the stent was indwelling, however he had increasing pain on the stent's removal and was seen in the ED on 09/02/2017.  A nonconstrast CT was performed which showed faint punctate material at the UPJ and UVJ, with mild right hydronephrosis and hydroureter.  This was thought to be most likely due to ureteral edema, and was managed conservatively.  When he was seen by Dr. Bernardo Heater on 09/17/2017, he was still experiencing right lower quadrant abdominal pain and occasional nausea, and was using Zofran.  He denied fevers, chills, and gross hematuria.  ***  PMH: Past Medical History:  Diagnosis Date   Arthritis    Osteo vs rheumatoid ?   Back ache 05/05/2013   Diabetes mellitus without complication (HCC)    GERD (gastroesophageal reflux disease)    Headache    History of kidney stones    Hyperlipemia    Hypertension    Kidney stones    Kidney stones    Nephrolithiasis    Shortness of breath dyspnea     Surgical History: Past Surgical History:  Procedure Laterality Date   APPENDECTOMY     BACK SURGERY  2015   x3   BACK SURGERY  08/09/2016   CHOLECYSTECTOMY  2000   COLONOSCOPY WITH PROPOFOL N/A 04/11/2015   Procedure: COLONOSCOPY WITH PROPOFOL;  Surgeon:  Manya Silvas, MD;  Location: [facility] ENDOSCOPY;  Service: Endoscopy;  Laterality: N/A;   CYSTOSCOPY WITH URETEROSCOPY AND STENT PLACEMENT Right 08/27/2017   Procedure: CYSTOSCOPY WITH URETEROSCOPY AND STENT PLACEMENT;  Surgeon: Abbie Sons, MD;  Location: [facility] ORS;  Service: Urology;  Laterality: Right;   ESOPHAGOGASTRODUODENOSCOPY (EGD) WITH PROPOFOL N/A 04/11/2015   Procedure: ESOPHAGOGASTRODUODENOSCOPY (EGD) WITH PROPOFOL;  Surgeon: Manya Silvas, MD;  Location: [facility] ENDOSCOPY;  Service:  Endoscopy;  Laterality: N/A;   LAPAROSCOPIC APPENDECTOMY N/A 12/18/2017   Procedure: APPENDECTOMY LAPAROSCOPIC;  Surgeon: Florene Glen, MD;  Location: [facility] ORS;  Service: General;  Laterality: N/A;   SHOULDER SURGERY Right 2016   SHOULDER SURGERY Left 06/2015    Home Medications:  Allergies as of 08/05/2018      Reactions   Adhesive [tape] Other (See Comments)   Honeycomb dressing, blisters      Medication List       Accurate as of August 05, 2018 10:31 AM. Always use your most recent med list.        acetaminophen 500 MG tablet Commonly known as:  TYLENOL Take 1,000 mg by mouth every 6 (six) hours as needed for mild pain.   albuterol 108 (90 Base) MCG/ACT inhaler Commonly known as:  PROVENTIL HFA;VENTOLIN HFA Inhale 2 puffs into the lungs every 6 (six) hours as needed for wheezing or shortness of breath.   atorvastatin 40 MG tablet Commonly known as:  LIPITOR Take 1 tablet (40 mg total) by mouth at bedtime.   celecoxib 100 MG capsule Commonly known as:  CELEBREX Take 1 capsule (100 mg total) by mouth 2 (two) times daily.   chlorthalidone 50 MG tablet Commonly known as:  HYGROTON Take 50 mg by mouth daily.   gabapentin 300 MG capsule Commonly known as:  NEURONTIN Take 300 mg by mouth daily as needed (pain).   meclizine 25 MG tablet Commonly known as:  ANTIVERT Take 25 mg by mouth every 6 (six) hours as needed for dizziness.   methocarbamol 500 MG tablet Commonly known as:  ROBAXIN Take 2 tablets (1,000 mg total) by mouth every 6 (six) hours as needed for muscle spasms.   ondansetron 4 MG disintegrating tablet Commonly known as:  ZOFRAN ODT Take 1 tablet (4 mg total) by mouth every 8 (eight) hours as needed for nausea or vomiting.   ondansetron 4 MG disintegrating tablet Commonly known as:  ZOFRAN ODT Take 1 tablet (4 mg total) by mouth every 8 (eight) hours as needed for nausea or vomiting.   oxyCODONE 5 MG immediate release tablet Commonly known  as:  Oxy IR/ROXICODONE Take 1-2 tablets (5-10 mg total) by mouth every 3 (three) hours as needed for severe pain or breakthrough pain.   oxyCODONE-acetaminophen 5-325 MG tablet Commonly known as:  PERCOCET Take 1 tablet by mouth every 6 (six) hours as needed for severe pain.   oxyCODONE-acetaminophen 5-325 MG tablet Commonly known as:  PERCOCET Take 1 tablet by mouth every 6 (six) hours as needed for severe pain.   oxyCODONE-acetaminophen 5-325 MG tablet Commonly known as:  PERCOCET Take 1 tablet by mouth every 6 (six) hours as needed for severe pain. Must last 30 days.   potassium chloride 10 MEQ tablet Commonly known as:  K-DUR Take 1 tablet (10 mEq total) by mouth daily.   ranitidine 150 MG tablet Commonly known as:  ZANTAC Take 150 mg by mouth daily as needed for heartburn.   sitaGLIPtin 50 MG tablet Commonly known as:  JANUVIA Take 1 tablet (50 mg total) by mouth daily.   tamsulosin 0.4 MG Caps capsule Commonly known as:  FLOMAX Take 1 capsule (0.4 mg total) by mouth daily.   testosterone cypionate 200 MG/ML injection Commonly known as:  DEPOTESTOSTERONE CYPIONATE Inject 200 mg into the muscle every 14 (fourteen) days.   tiZANidine 2 MG tablet Commonly known as:  ZANAFLEX       Allergies:  Allergies  Allergen Reactions   Adhesive [Tape] Other (See Comments)    Honeycomb dressing, blisters    Family History: Family History  Problem Relation Age of Onset   Cancer Mother        Breast CA   Stroke Father    Heart disease Father    Cancer Sister    Cancer Maternal Grandmother    Cancer Sister    Bladder Cancer Neg Hx    Prostate cancer Neg Hx    Kidney cancer Neg Hx     Social History:  reports that he has never smoked. He quit smokeless tobacco use about 5 years ago.  His smokeless tobacco use included chew. He reports that he does not drink alcohol or use drugs.  ROS:                                         Physical Exam: There were no vitals taken for this visit.  Constitutional: Well nourished. Alert and oriented, No acute distress. HEENT: [place] AT, moist mucus membranes. Trachea midline, no masses. Cardiovascular: No clubbing, cyanosis, or edema. Respiratory: Normal respiratory effort, no increased work of breathing. GI: Abdomen is soft, non tender, non distended, no abdominal masses. Liver and spleen not palpable.  No hernias appreciated.  Stool sample for occult testing is not indicated.   GU: No CVA tenderness.  No bladder fullness or masses.   Skin: No rashes, bruises or suspicious lesions. Lymph: No cervical or inguinal adenopathy. Neurologic: Grossly intact, no focal deficits, moving all 4 extremities. Psychiatric: Normal mood and affect.  Laboratory Data: Lab Results  Component Value Date   WBC 11.8 (H) 04/04/2018   HGB 11.3 (L) 04/04/2018   HCT 33.4 (L) 04/04/2018   MCV 90.3 04/04/2018   PLT 138 (L) 04/04/2018    Lab Results  Component Value Date   CREATININE 1.04 03/25/2018    No results found for: PSA  No results found for: TESTOSTERONE  Lab Results  Component Value Date   HGBA1C 6.9 (H) 03/07/2017    No results found for: TSH     Component Value Date/Time   CHOL 166 03/07/2017 1443   HDL 39 (L) 03/07/2017 1443   CHOLHDL 4.3 03/07/2017 1443   CHOLHDL 5.1 (H) 02/24/2016 1557   VLDL 48 (H) 02/24/2016 1557   LDLCALC 98 03/07/2017 1443    Lab Results  Component Value Date   AST 31 12/28/2017   Lab Results  Component Value Date   ALT 31 12/28/2017   No components found for: ALKALINEPHOPHATASE No components found for: BILIRUBINTOTAL  No results found for: ESTRADIOL  Urinalysis    Component Value Date/Time   COLORURINE YELLOW (A) 03/25/2018 0951   APPEARANCEUR CLEAR (A) 03/25/2018 0951   APPEARANCEUR Clear 08/15/2017 1347   LABSPEC 1.015 03/25/2018 0951   LABSPEC 1.015 03/23/2014 2003   PHURINE 6.0 03/25/2018 0951   GLUCOSEU 50 (A)  03/25/2018 0951   GLUCOSEU 50 mg/dL 03/23/2014 2003  [place] NEGATIVE 03/25/2018 [place] 03/25/2018 0951   BILIRUBINUR Negative 08/15/2017 [place] Negative 03/23/2014 2003   KETONESUR NEGATIVE 03/25/2018 [place] 03/25/2018 0951   UROBILINOGEN 1.0 02/23/2015 2347   NITRITE NEGATIVE 03/25/2018 [place] 03/25/2018 0951   LEUKOCYTESUR Negative 08/15/2017 1347   LEUKOCYTESUR Negative 03/23/2014 2003    I have reviewed the labs.   Pertinent Imaging: CLINICAL DATA:  Initial evaluation for acute right flank pain.  EXAM: CT ABDOMEN AND PELVIS WITHOUT CONTRAST  TECHNIQUE: Multidetector CT imaging of the abdomen and pelvis was performed following the standard protocol without IV contrast.  COMPARISON:  Prior CT from 04/16/2017.  FINDINGS: Lower chest: Mild scattered atelectatic changes within the right lung base. Visualized lungs are otherwise clear.  Hepatobiliary: Hepatic steatosis. Liver otherwise unremarkable. Gallbladder surgically absent. No biliary dilatation.  Pancreas: Pancreas within normal limits.  Spleen: Spleen within normal limits.  Adrenals/Urinary Tract: Adrenal glands are normal.  On the right, there is a 6 mm stone positioned within the right renal pelvis (series 2, image 37). Associated mild pelviectasis, suggesting that this be at least partially obstructive. Additional punctate nonobstructive calculus within the upper pole the right kidney. Right ureter decompressed distally with no other calculi identified.  Faint punctate nonobstructive calculi present within the left kidney. No hydronephrosis. No radiopaque calculi seen along the course of the left renal collecting system. No left-sided hydroureter.  Partially distended bladder within normal limits. No layering stones within the bladder lumen.  Stomach/Bowel: Stomach within normal limits. No evidence for  bowel obstruction. Appendix is normal. No acute inflammatory changes seen about the bowels.  Vascular/Lymphatic: Intra-abdominal aorta of normal caliber. Mild aortic atherosclerosis. No adenopathy.  Reproductive: Prostate within normal limits.  Other: No free air or fluid. Small fat containing paraumbilical and bilateral inguinal hernias without associated inflammation.  Musculoskeletal: No acute osseous abnormality. No worrisome lytic or blastic osseous lesions. Sequelae of prior PLIF at L3-4 and L4-5.  IMPRESSION: 1. 6 mm stone lodged within the right renal pelvis with secondary mild right pelviectasis. 2. Additional punctate nonobstructive right renal nephrolithiasis. 3. No other acute intra-abdominal or pelvic process.   Electronically Signed   By: Jeannine Boga M.D.   On: 08/12/2017 21:59   I have independently reviewed the films.    Assessment & Plan:    1. Right UPJ stone  -explained to the patient that AUA Guidelines for patients with uncomplicated ureteral stones ?10 mm should be offered observation, and those with distal stones of similar size should be offered MET with ?-blockers - he will continue the tamsulosin but he is wanting to move forward with definitive management  - explained that URS would be the first lined therapy if stone(s) do not pass, but ESWL is the procedure with the least morbidity and lowest complication rate, but URS has a greater stone-free rate in a single procedure - he would like to pursue right URS  - patient will be scheduled for right URS/LL/ureteral stent placement  - explained to the patient how the procedure is performed and the risks involved  - informed patient that they will have a stent placed during the procedure and will remain in place after the procedure for a short time.   - stent may be removed in the office with a cystoscope or patient may be instructed to remove the stent themselves by the string  - described  "stent pain" as feelings of needing to urinate/overactive  bladder and a warm, tingling sensation to intense pain in the affected flank  - residual stones within the kidney or ureter may be present after the procedure and may need to have these addressed at a different encounter  - injury to the ureter is the most common intra-operative risk, it may result in an open procedure to correct the defect  - infection and bleeding are also risks  - explained the risks of general anesthesia, such as: MI, CVA, paralysis, coma and/or death.  - advised to contact our office or seek treatment in the ED if becomes febrile or pain/ vomiting are difficult control in order to arrange for emergent/urgent intervention  2. Right hydronephrosis  - obtain RUS to ensure the hydronephrosis has resolved once he has recovered from URS  3. Gross hematuria  - UA today demonstrates 3-10 RBC's.    - continue to monitor the patient's UA after the treatment/passage of the stone to ensure the hematuria has resolved  - if hematuria persists, we will pursue a hematuria workup with CT Urogram and cystoscopy if appropriate.  - CULTURE, URINE COMPREHENSIVE  - Urinalysis, Complete  4. Bilateral renal stones  - punctate at this time no intervention warranted  - will offer a 24 hour metabolic study once he has recovered from URS   No follow-ups on file.  Zara Council, PA-C  [facility] [address], [place], [place] [address] [phone]  I, Adele Schilder, am acting as a Education administrator for [employer], PA-C.   {Add Scribe Attestation Statement}

## 2018-08-06 ENCOUNTER — Encounter: Payer: Self-pay | Admitting: Nurse Practitioner

## 2018-08-06 ENCOUNTER — Other Ambulatory Visit: Payer: Self-pay

## 2018-08-06 ENCOUNTER — Ambulatory Visit: Payer: Medicare Other | Attending: Nurse Practitioner | Admitting: Nurse Practitioner

## 2018-08-06 VITALS — BP 136/84 | HR 80 | Temp 97.8°F | Resp 16 | Ht 67.0 in | Wt 187.0 lb

## 2018-08-06 DIAGNOSIS — G894 Chronic pain syndrome: Secondary | ICD-10-CM | POA: Diagnosis not present

## 2018-08-06 DIAGNOSIS — M47816 Spondylosis without myelopathy or radiculopathy, lumbar region: Secondary | ICD-10-CM | POA: Diagnosis not present

## 2018-08-06 DIAGNOSIS — Z79891 Long term (current) use of opiate analgesic: Secondary | ICD-10-CM | POA: Diagnosis not present

## 2018-08-06 DIAGNOSIS — M47812 Spondylosis without myelopathy or radiculopathy, cervical region: Secondary | ICD-10-CM

## 2018-08-06 DIAGNOSIS — M792 Neuralgia and neuritis, unspecified: Secondary | ICD-10-CM

## 2018-08-06 MED ORDER — OXYCODONE-ACETAMINOPHEN 5-325 MG PO TABS: 1.0000 | ORAL_TABLET | Freq: Four times a day (QID) | ORAL | 0 refills | Status: DC | PRN

## 2018-08-06 NOTE — Patient Instructions (Signed)
____________________________________________________________________________________________  Medication Rules  Purpose: To inform patients, and their family members, of our rules and regulations.  Applies to: All patients receiving prescriptions (written or electronic).  Pharmacy of record: Pharmacy where electronic prescriptions will be sent. If written prescriptions are taken to a different pharmacy, please inform the nursing staff. The pharmacy listed in the electronic medical record should be the one where you would like electronic prescriptions to be sent.  Electronic prescriptions: In compliance with the [place] Strengthen Opioid Misuse Prevention (STOP) Act of 2017 (Session Law 2017-74/H243), effective June 11, 2018, all controlled substances must be electronically prescribed. Calling prescriptions to the pharmacy will cease to exist.  Prescription refills: Only during scheduled appointments. Applies to all prescriptions.  NOTE: The following applies primarily to controlled substances (Opioid* Pain Medications).   Patient's responsibilities: 1. Pain Pills: Bring all pain pills to every appointment (except for procedure appointments). 2. Pill Bottles: Bring pills in original pharmacy bottle. Always bring the newest bottle. Bring bottle, even if empty. 3. Medication refills: You are responsible for knowing and keeping track of what medications you take and those you need refilled. The day before your appointment: write a list of all prescriptions that need to be refilled. The day of the appointment: give the list to the admitting nurse. Prescriptions will be written only during appointments. No prescriptions will be written on procedure days. If you forget a medication: it will not be "Called in", "Faxed", or "electronically sent". You will need to get another appointment to get these prescribed. No early refills. Do not call asking to have your prescription filled  early. 4. Prescription Accuracy: You are responsible for carefully inspecting your prescriptions before leaving our office. Have the discharge nurse carefully go over each prescription with you, before taking them home. Make sure that your name is accurately spelled, that your address is correct. Check the name and dose of your medication to make sure it is accurate. Check the number of pills, and the written instructions to make sure they are clear and accurate. Make sure that you are given enough medication to last until your next medication refill appointment. 5. Taking Medication: Take medication as prescribed. When it comes to controlled substances, taking less pills or less frequently than prescribed is permitted and encouraged. Never take more pills than instructed. Never take medication more frequently than prescribed.  6. Inform other Doctors: Always inform, all of your healthcare providers, of all the medications you take. 7. Pain Medication from other Providers: You are not allowed to accept any additional pain medication from any other Doctor or Healthcare provider. There are two exceptions to this rule. (see below) In the event that you require additional pain medication, you are responsible for notifying us, as stated below. 8. Medication Agreement: You are responsible for carefully reading and following our Medication Agreement. This must be signed before receiving any prescriptions from our practice. Safely store a copy of your signed Agreement. Violations to the Agreement will result in no further prescriptions. (Additional copies of our Medication Agreement are available upon request.) 9. Laws, Rules, & Regulations: All patients are expected to follow all Federal and State Laws, Statutes, Rules, & Regulations. Ignorance of the Laws does not constitute a valid excuse. The use of any illegal substances is prohibited. 10. Adopted CDC guidelines & recommendations: Target dosing levels will be  at or below 60 MME/day. Use of benzodiazepines** is not recommended.  Exceptions: There are only two exceptions to the rule of not   receiving pain medications from other Healthcare Providers. 1. Exception #1 (Emergencies): In the event of an emergency (i.e.: accident requiring emergency care), you are allowed to receive additional pain medication. However, you are responsible for: As soon as you are able, call our office [phone], at any time of the day or night, and leave a message stating your name, the date and nature of the emergency, and the name and dose of the medication prescribed. In the event that your call is answered by a member of our staff, make sure to document and save the date, time, and the name of the person that took your information.  2. Exception #2 (Planned Surgery): In the event that you are scheduled by another doctor or dentist to have any type of surgery or procedure, you are allowed (for a period no longer than 30 days), to receive additional pain medication, for the acute post-op pain. However, in this case, you are responsible for picking up a copy of our "Post-op Pain Management for Surgeons" handout, and giving it to your surgeon or dentist. This document is available at our office, and does not require an appointment to obtain it. Simply go to our office during business hours (Monday-Thursday from 8:00 AM to 4:00 PM) (Friday 8:00 AM to 12:00 Noon) or if you have a scheduled appointment with us, prior to your surgery, and ask for it by name. In addition, you will need to provide us with your name, name of your surgeon, type of surgery, and date of procedure or surgery.  *Opioid medications include: morphine, codeine, oxycodone, oxymorphone, hydrocodone, hydromorphone, meperidine, tramadol, tapentadol, buprenorphine, fentanyl, methadone. **Benzodiazepine medications include: diazepam (Valium), alprazolam (Xanax), clonazepam (Klonopine), lorazepam (Ativan), clorazepate  (Tranxene), chlordiazepoxide (Librium), estazolam (Prosom), oxazepam (Serax), temazepam (Restoril), triazolam (Halcion) (Last updated: 08/08/2017) ____________________________________________________________________________________________    

## 2018-08-06 NOTE — Progress Notes (Signed)
Nursing Pain Medication Assessment:  Safety precautions to be maintained throughout the outpatient stay will include: orient to surroundings, keep bed in low position, maintain call bell within reach at all times, provide assistance with transfer out of bed and ambulation.  Medication Inspection Compliance: Pill count conducted under aseptic conditions, in front of the patient. Neither the pills nor the bottle was removed from the patient's sight at any time. Once count was completed pills were immediately returned to the patient in their original bottle.  Medication: Oxycodone/APAP Pill/Patch Count: 0 of 120 pills remain Pill/Patch Appearance: Markings consistent with prescribed medication Bottle Appearance: Standard pharmacy container. Clearly labeled. Filled Date: 01 / 26 / 2020 Last Medication intake:  Yesterday

## 2018-08-06 NOTE — Progress Notes (Signed)
Patient's Name: Howard Murphy  MRN: [id number]  Referring Provider: [facility], [place]*  DOB: 08/12/1955  PCP: [facility], [place]: 08/06/2018  Note by: Dionisio Findley, NP  Service setting: Ambulatory outpatient  Specialty: Interventional Pain Management  Location: [facility] (AMB) Pain Management Facility    Patient type: Established   HPI  Reason for Visit: Howard Murphy is a 63 y.o. year old, male patient, who comes today with a chief complaint of Back Pain (lower, middle ) Last Appointment: His last appointment at our practice was on 07/31/2018. I last saw him on 07/31/2018.  Pain Assessment: Today, Howard Murphy describes the severity of the Chronic pain as a 5 /10. He indicates the location/referral of the pain to be Back Lower, Mid/denies . Onset was: More than a month ago. The quality of pain is described as Pins and needles, Constant, Discomfort, Numbness. Temporal description, or timing of pain is: Constant. Possible modifying factors: nothing currently. Howard Murphy  height is _0  (1.702 m) and weight is 187 lb (84.8 kg). His oral temperature is 97.8 F (36.6 C). His blood pressure is 136/84 and his pulse is 80. His respiration is 16 and oxygen saturation is 97%.  He feels like he is doing better. He is able to walk since his surgery in October. He admits that he will follow up with Dr Izora Ribas because he is having the numbness and tingling across his back.   Controlled Substance Pharmacotherapy Assessment REMS (Risk Evaluation and Mitigation Strategy)  gesic:Oxycodone IR 10 mg 1 tablet by mouth 4 times a day (40 mg/dayof oxycodone) (60 MME/Day) MME/day:74m/day.  PJanett Billow RN  08/06/2018 10:30 AM  Sign when Signing Visit Nursing Pain Medication Assessment:  Safety precautions to be maintained throughout the outpatient stay will include: orient to surroundings, keep bed in low position, maintain call bell within reach at all times, provide assistance  with transfer out of bed and ambulation.  Medication Inspection Compliance: Pill count conducted under aseptic conditions, in front of the patient. Neither the pills nor the bottle was removed from the patient's sight at any time. Once count was completed pills were immediately returned to the patient in their original bottle.  Medication: Oxycodone/APAP Pill/Patch Count: 0 of 120 pills remain Pill/Patch Appearance: Markings consistent with prescribed medication Bottle Appearance: Standard pharmacy container. Clearly labeled. Filled Date: 01 / 26 / 2020 Last Medication intake:  Yesterday   Pharmacokinetics: Liberation and absorption (onset of action): WNL Distribution (time to peak effect): WNL Metabolism and excretion (duration of action): WNL         Pharmacodynamics: Desired effects: Analgesia: Mr. FColaizzireports >50% benefit. Functional ability: Patient reports that medication allows him to accomplish basic ADLs Clinically meaningful improvement in function (CMIF): Sustained CMIF goals met Perceived effectiveness: Described as relatively effective, allowing for increase in activities of daily living (ADL) Undesirable effects: Side-effects or Adverse reactions: None reported Monitoring: [place] PMP: Online review of the past 167-montheriod conducted. Compliant with practice rules and regulations Last UDS on record: Summary  Date Value Ref Range Status  04/30/2018 FINAL  Final    Comment:    ==================================================================== TOXASSURE SELECT 13 (MW) ==================================================================== Test                             Result       Flag       Units Drug Present and Declared for Prescription Verification  Oxycodone                      2498         EXPECTED   ng/mg creat   Oxymorphone                    1978         EXPECTED   ng/mg creat   Noroxycodone                   3786         EXPECTED   ng/mg creat    Noroxymorphone                 586          EXPECTED   ng/mg creat    Sources of oxycodone are scheduled prescription medications.    Oxymorphone, noroxycodone, and noroxymorphone are expected    metabolites of oxycodone. Oxymorphone is also available as a    scheduled prescription medication. Drug Present not Declared for Prescription Verification   Alprazolam                     89           UNEXPECTED ng/mg creat   Alpha-hydroxyalprazolam        52           UNEXPECTED ng/mg creat    Source of alprazolam is a scheduled prescription medication.    Alpha-hydroxyalprazolam is an expected metabolite of alprazolam. ==================================================================== Test                      Result    Flag   Units      Ref Range   Creatinine              250              mg/dL      >=20 ==================================================================== Declared Medications:  The flagging and interpretation on this report are based on the  following declared medications.  Unexpected results may arise from  inaccuracies in the declared medications.  **Note: The testing scope of this panel includes these medications:  Oxycodone  Oxycodone (Oxycodone Acetaminophen)  **Note: The testing scope of this panel does not include following  reported medications:  Acetaminophen  Acetaminophen (Oxycodone Acetaminophen)  Albuterol  Atorvastatin  Celecoxib  Chlorthalidone  Gabapentin  Meclizine  Methocarbamol  Ondansetron  Potassium  Ranitidine  Sitagliptin  Tamsulosin  Tizanidine ==================================================================== For clinical consultation, please call [phone]. ====================================================================    UDS interpretation: Non-Compliant Patient informed of the CDC guidelines and recommendations to stay away from the concomitant use of benzodiazepines and opioids due to the increased risk of respiratory  depression and death. Medication Assessment Form: Discrepancies found between patient's report and information collected Treatment compliance: Deficiencies noted and steps taken to remind the patient of the seriousness of adequate therapy compliance Risk Assessment Profile: Aberrant behavior: See initial evaluations. None observed or detected today Comorbid factors increasing risk of overdose: See initial evaluation. No additional risks detected today Opioid risk tool (ORT):  Opioid Risk  08/06/2018  Alcohol 0  Illegal Drugs 0  Rx Drugs 0  Alcohol 0  Illegal Drugs 0  Rx Drugs 0  Age between 16-45 years  -  History of Preadolescent Sexual Abuse 0  Psychological Disease 0  Depression 0  Opioid Risk Tool Scoring 0  Opioid Risk  Interpretation Low Risk    ORT Scoring interpretation table:  Score <3 = Low Risk for SUD  Score between 4-7 = Moderate Risk for SUD  Score >8 = High Risk for Opioid Abuse   Risk of substance use disorder (SUD): Low-to-Moderate  Risk Mitigation Strategies:  Patient Counseling: Covered Patient-Prescriber Agreement (PPA): Present and active  Notification to other healthcare providers: Done  Pharmacologic Plan: No change in therapy, at this time.             ROS  Constitutional: Denies any fever or chills Gastrointestinal: No reported hemesis, hematochezia, vomiting, or acute GI distress Musculoskeletal: Denies any acute onset joint swelling, redness, loss of ROM, or weakness Neurological: No reported episodes of acute onset apraxia, aphasia, dysarthria, agnosia, amnesia, paralysis, loss of coordination, or loss of consciousness  Medication Review  acetaminophen, albuterol, atorvastatin, celecoxib, chlorthalidone, gabapentin, meclizine, methocarbamol, ondansetron, oxyCODONE-acetaminophen, potassium chloride, ranitidine, sitaGLIPtin, tamsulosin, and tiZANidine  History Review  Allergy: Howard Murphy is allergic to adhesive [tape]. Drug: Howard Murphy  reports  no history of drug use. Alcohol:  reports no history of alcohol use. Tobacco:  reports that he has never smoked. He quit smokeless tobacco use about 5 years ago.  His smokeless tobacco use included chew. Social: Howard Murphy  reports that he has never smoked. He quit smokeless tobacco use about 5 years ago.  His smokeless tobacco use included chew. He reports that he does not drink alcohol or use drugs. Medical:  has a past medical history of Arthritis, Back ache (05/05/2013), Diabetes mellitus without complication ([place]), GERD (gastroesophageal reflux disease), Headache, History of kidney stones, Hyperlipemia, Hypertension, Kidney stones, Kidney stones, Nephrolithiasis, and Shortness of breath dyspnea. Surgical: Howard Murphy  has a past surgical history that includes Cholecystectomy (2000); Shoulder surgery (Right, 2016); Colonoscopy with propofol (N/A, 04/11/2015); Esophagogastroduodenoscopy (egd) with propofol (N/A, 04/11/2015); Shoulder surgery (Left, 06/2015); Back surgery (2015); Back surgery (08/09/2016); Cystoscopy with ureteroscopy and stent placement (Right, 08/27/2017); laparoscopic appendectomy (N/A, 12/18/2017); Appendectomy; and Back surgery (Bilateral, 2019). Family: family history includes Cancer in his maternal grandmother, mother, sister, and sister; Heart disease in his father; Stroke in his father. Problem List: Howard Murphy has RA (rheumatoid arthritis) ([place]); Chronic abdominal pain (RUQ); Spondylosis of lumbar spine; Lumbar facet joint syndrome ([place]); Chronic hip pain (Left); Diabetic peripheral neuropathy ([place]); Chronic lumbar radicular pain (S1 Dermatome) (Bilateral) (L>R); Chronic shoulder pain (Primary Source of Pain) (Bilateral) (L>R); Diabetes ([place]); Chronic foot pain (Secondary source of pain) (Bilateral) (L>R); Chronic hand pain [facility] source of pain) (Bilateral) (R>L); Lumbar facet hypertrophy; Occipital pain (Right); Failed back surgical syndrome 4; Chronic low back pain (Primary  Source of Pain) (Bilateral) (L>R); Radicular pain of shoulder (Right); Carpal tunnel syndrome (Bilateral); Chronic cervical radicular pain (Right); Chronic sacroiliac joint pain (Bilateral) (L>R); Neurogenic pain; Cervical spondylosis; Cervical facet arthropathy; Chronic neck pain (Bilateral) (R>L); Cervical facet syndrome (Bilateral) (R>L); Chronic upper extremity pain (Bilateral) (R>L);  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  Lab Review  Kidney Function Lab Results  Component Value Date   BUN 16 03/25/2018   CREATININE 1.04 03/25/2018   BCR 14 03/07/2017   GFRAA >60 03/25/2018   GFRNONAA >60 03/25/2018  Liver Function Lab Results  Component Value Date   AST 31  12/28/2017   ALT 31 12/28/2017   ALBUMIN 4.0 12/28/2017  Note: Above Lab results reviewed.  Imaging Review  DG Lumbar Spine 2-3 Views CLINICAL DATA:  Status post surgical posterior fusion.  EXAM: LUMBAR SPINE - 2-3 VIEW  COMPARISON:  Fluoroscopic images of April 02, 2018. CT scan of December 29, 2017.  FINDINGS: Status post surgical posterior fusion extending from L2-S2 with bilateral intrapedicular screw placement. Interbody fusion of L2-3, L3-4 and L4-5 is noted. Good alignment vertebral bodies is noted.  IMPRESSION: Status post surgical posterior fusion as described above.  Electronically Signed   By: Marijo Conception, M.D.   On: 04/04/2018 11:07 Note: Reviewed        Physical Exam  General appearance: Well nourished, well developed, and well hydrated. In no apparent acute distress Mental status: Alert, oriented x 3 (person, place, & time)       Respiratory: No evidence of acute respiratory distress Eyes:  PERLA Vitals: BP 136/84 (BP Location: Right Arm, Patient Position: Sitting, Cuff Size: Normal)   Pulse 80   Temp 97.8 F (36.6 C) (Oral)   Resp 16   Ht _0  (1.702 m)   Wt 187 lb (84.8 kg)   SpO2 97%   BMI 29.29 kg/m  BMI: Estimated body mass index is 29.29 kg/m as calculated from the following:   Height as of this encounter: _1  (1.702 m).   Weight as of this encounter: 187 lb (84.8 kg). Ideal: Ideal body weight: 66.1 kg (145 lb 11.6 oz) Adjusted ideal body weight: 73.6 kg (162 lb 3.8 oz) Lumbar Spine Area Exam  Skin & Axial Inspection: Well healed scar from previous spine surgery detected Alignment: Symmetrical Functional ROM: Unrestricted ROM       Stability: No instability detected Muscle Tone/Strength: Functionally intact. No obvious neuro-muscular anomalies detected. Sensory (Neurological): Unimpaired Palpation: Uncomfortable        Gait & Posture Assessment  Ambulation: Unassisted Gait: Relatively normal for age and body habitus Posture: WNL  Lower Extremity Exam    Side: Right lower extremity  Side: Left lower extremity  Stability: No instability observed          Stability: No instability observed          Skin & Extremity Inspection: Skin color, temperature, and hair growth are WNL. No peripheral edema or cyanosis. No masses, redness, swelling, asymmetry, or associated skin lesions. No contractures.  Skin & Extremity Inspection: Skin color, temperature, and hair growth are WNL. No peripheral edema or cyanosis. No masses, redness, swelling, asymmetry, or associated skin lesions. No contractures.  Functional ROM: Unrestricted ROM                  Functional ROM: Unrestricted ROM                  Muscle Tone/Strength: Functionally intact. No obvious neuro-muscular anomalies detected.  Muscle Tone/Strength: Functionally intact. No obvious neuro-muscular anomalies detected.  Sensory (Neurological): Unimpaired        Sensory (Neurological): Unimpaired             Palpation: No palpable anomalies  Palpation: No palpable anomalies   Assessment   Status Diagnosis  Controlled Controlled Worsening 1. Spondylosis of lumbar spine   2. Cervical spondylosis   3. Neurogenic pain   4. Chronic pain syndrome   5. Long term prescription opiate use      Updated Problems: No problems updated.  Plan of Care  Pharmacotherapy (Medications Ordered): Meds ordered  this encounter  Medications  . oxyCODONE-acetaminophen (PERCOCET) 5-325 MG tablet    Sig: Take 1 tablet by mouth every 6 (six) hours as needed for up to 30 days for severe pain. Must last 30 days.    Dispense:  120 tablet    Refill:  0    Do not place this medication, or any other prescription from our practice, on "Automatic Refill". Patient may have prescription filled one day early if pharmacy is closed on scheduled refill date.    Order Specific Question:   Supervising Provider    Answer:   Milinda Pointer [phone]  . oxyCODONE-acetaminophen (PERCOCET) 5-325 MG tablet    Sig: Take 1 tablet by mouth every 6 (six) hours as needed for up to 30 days for severe pain.    Dispense:  120 tablet    Refill:  0    Do not place this medication, or any other prescription from our practice, on "Automatic Refill". Patient may have prescription filled one day early if pharmacy is closed on scheduled refill date.    Order Specific Question:   Supervising Provider    Answer:   Milinda Pointer [phone]  . oxyCODONE-acetaminophen (PERCOCET) 5-325 MG tablet    Sig: Take 1 tablet by mouth every 6 (six) hours as needed for up to 30 days for severe pain.    Dispense:  120 tablet    Refill:  0    Do not place this medication, or any other prescription from our practice, on "Automatic Refill". Patient may have prescription filled one day early if pharmacy is closed on scheduled refill date.    Order Specific Question:   Supervising Provider    Answer:   Milinda Pointer [[id number]]   Administered  today: Monia Sabal. [place] had no medications administered during this visit.  Orders:  Orders Placed This Encounter  Procedures  . ToxASSURE Select 13 (MW), Urine    Volume: 30 ml(s). Minimum 3 ml of urine is needed. Document temperature of fresh sample. Indications: Long term (current) use of opiate analgesic (M01.027)   Interventional options: Planned follow-up:   Not at this time. Consider Gabapentin Plan: Return in about 3 months (around 11/04/2018) for MedMgmt.   Considering: Diagnostic bilateral cervical facet block Possible bilateral cervical facet radiofrequency ablation  Diagnostic right-sided greater occipital nerve block Possible right-sided greater occipital nerve radiofrequencyablation  Possible right-sided peripheral nerve stimulator trial  Diagnostic right-sided cervical epidural steroid injection  Diagnostic bilateral intra-articular shoulder injection Diagnostic bilateral suprascapular nerve block Possible bilateral suprascapular nerve radiofrequencyablation  Diagnostic bilateral sacroiliac joint block Possible bilateral sacroiliac joint radiofrequencyablation  Diagnostic bilateral lumbar facet block Possible bilateral lumbar facet radiofrequencyablation  Diagnostic left L4-5 transforaminal epiduralsteroid injection  Diagnostic left-sided L4-5 lumbar epidural steroid injection  Diagnostic left intra-articular hip joint injection Possible left hip joint radiofrequencyablation  Diagnostic bilateral median nerve block(carpal tunnel block)  Diagnostic right-sided celiac plexus block   Palliative PRN treatment(s): None at this time   Note by: Dionisio Tayton, NP Date: 08/06/2018; Time: 11:29 AM

## 2018-08-09 LAB — TOXASSURE SELECT 13 (MW), URINE

## 2018-09-09 ENCOUNTER — Ambulatory Visit: Payer: Medicare Other | Admitting: Urology

## 2018-10-06 DIAGNOSIS — E119 Type 2 diabetes mellitus without complications: Secondary | ICD-10-CM | POA: Diagnosis not present

## 2018-10-06 DIAGNOSIS — Z1389 Encounter for screening for other disorder: Secondary | ICD-10-CM | POA: Diagnosis not present

## 2018-10-06 DIAGNOSIS — E1165 Type 2 diabetes mellitus with hyperglycemia: Secondary | ICD-10-CM | POA: Diagnosis not present

## 2018-10-06 DIAGNOSIS — I10 Essential (primary) hypertension: Secondary | ICD-10-CM | POA: Diagnosis not present

## 2018-10-06 DIAGNOSIS — E291 Testicular hypofunction: Secondary | ICD-10-CM | POA: Diagnosis not present

## 2018-10-06 DIAGNOSIS — E785 Hyperlipidemia, unspecified: Secondary | ICD-10-CM | POA: Diagnosis not present

## 2018-10-13 DIAGNOSIS — E1165 Type 2 diabetes mellitus with hyperglycemia: Secondary | ICD-10-CM | POA: Diagnosis not present

## 2018-10-13 DIAGNOSIS — E291 Testicular hypofunction: Secondary | ICD-10-CM | POA: Diagnosis not present

## 2018-10-13 DIAGNOSIS — E119 Type 2 diabetes mellitus without complications: Secondary | ICD-10-CM | POA: Diagnosis not present

## 2018-10-13 DIAGNOSIS — E785 Hyperlipidemia, unspecified: Secondary | ICD-10-CM | POA: Diagnosis not present

## 2018-11-04 ENCOUNTER — Telehealth: Payer: Self-pay | Admitting: Radiology

## 2018-11-04 MED ORDER — TAMSULOSIN HCL 0.4 MG PO CAPS: 0.4000 mg | ORAL_CAPSULE | Freq: Every day | ORAL | 0 refills | Status: AC

## 2018-11-04 MED ORDER — SILODOSIN 8 MG PO CAPS: 8.0000 mg | ORAL_CAPSULE | Freq: Every day | ORAL | 0 refills | Status: DC

## 2018-11-04 NOTE — Telephone Encounter (Signed)
Patient reports pain in right side going into right testicle. Feels like it may be a kidney stone. Denies fever or hematuria. Also asks for script for tamsulosin. Please advise.

## 2018-11-04 NOTE — Progress Notes (Signed)
Pain Management Virtual Encounter Note - Virtual Visit via Telephone Telehealth (real-time audio visits between healthcare provider and patient).  Patient's Phone No. & Preferred Pharmacy:  [phone] (home); [phone] (mobile); (Preferred) [phone] No e-mail address on record  [place], [place] - [address] [place] [address] Phone: [phone] Fax: [place] [place], [place] [address] Phone: [phone] Fax: [phone]   Pre-screening note:  Our staff contacted Howard Murphy and offered him an "in person", "face-to-face" appointment versus a telephone encounter. He indicated preferring the telephone encounter, at this time.  Reason for Virtual Visit: COVID-19*  Social distancing based on CDC and AMA recommendations.   I contacted Howard Murphy on 11/06/2018 at 9:21 AM via telephone.      I clearly identified myself as Gaspar Cola, MD. I verified that I was speaking with the correct person using two identifiers (Name and date of birth: 11-Sep-1955).  Advanced Informed Consent I sought verbal advanced consent from Howard Murphy for virtual visit interactions. I informed Howard Murphy of possible security and privacy concerns, risks, and limitations associated with providing "not-in-person" medical evaluation and management services. I also informed Howard Murphy of the availability of "in-person" appointments. Finally, I informed him that there would be a charge for the virtual visit and that he could be  personally, fully or partially, financially responsible for it. Howard Murphy expressed understanding and agreed to proceed.   Historic Elements   Howard Murphy is a 63 y.o. year old, male patient evaluated today after his last encounter by our practice on 11/05/2018. Mr. Cedillos  has a past medical history of Arthritis,  Back ache (05/05/2013), Diabetes mellitus without complication ([place]), GERD (gastroesophageal reflux disease), Headache, History of kidney stones, Hyperlipemia, Hypertension, Kidney stones, Kidney stones, Nephrolithiasis, and Shortness of breath dyspnea. He also  has a past surgical history that includes Cholecystectomy (2000); Shoulder surgery (Right, 2016); Colonoscopy with propofol (N/A, 04/11/2015); Esophagogastroduodenoscopy (egd) with propofol (N/A, 04/11/2015); Shoulder surgery (Left, 06/2015); Back surgery (2015); Back surgery (08/09/2016); Cystoscopy with ureteroscopy and stent placement (Right, 08/27/2017); laparoscopic appendectomy (N/A, 12/18/2017); Appendectomy; and Back surgery (Bilateral, 2019). Howard Murphy has a current medication list which includes the following prescription(s): albuterol, atorvastatin, celecoxib, chlorthalidone, meclizine, ondansetron, potassium chloride, silodosin, sitagliptin, tamsulosin, gabapentin, lisinopril, oxycodone-acetaminophen, oxycodone-acetaminophen, oxycodone-acetaminophen, and testosterone cypionate. He  reports that he has never smoked. He quit smokeless tobacco use about 5 years ago.  His smokeless tobacco use included chew. He reports that he does not drink alcohol or use drugs. Howard Murphy is allergic to adhesive [tape].   HPI  I last communicated with him on Visit date not found. Today, he is being contacted for medication management.  The patient indicates doing extremely well on his current medication regimen.  He is not experiencing any side effects or adverse reactions.  Unfortunately, he is currently passing a kidney stone.  Today I reviewed his case and it would seem that he still having problems with a burning sensation on his feet secondary to the diabetic peripheral neuropathy.  He indicates that nobody has been treating this and he has not tried any gabapentin or pregabalin, according to him.  Today I will go ahead with a gabapentin trial starting  with 100 mg p.o. at bedtime and slowly increasing it as tolerated.  I will follow-up with him  in about 1 month to see how he is doing with the gabapentin and if necessary we will increase it further at that time.  Pharmacotherapy Assessment  Analgesic: Oxycodone/APAP 5/325 mg 1 tablet by mouth 4 times a day (40 mg/dayof oxycodone) (30 MME/Day) MME/day:30mg /day.  Monitoring: Pharmacotherapy: No side-effects or adverse reactions reported. [place] PMP: PDMP reviewed during this encounter.       Compliance: No problems identified. Effectiveness: Clinically acceptable. Plan: Refer to "POC".  Pertinent Labs  Renal Function Lab Results  Component Value Date   BUN 16 03/25/2018   CREATININE 1.04 03/25/2018   BCR 14 03/07/2017   GFRAA >60 03/25/2018   GFRNONAA >60 03/25/2018   Hepatic Function Lab Results  Component Value Date   AST 31 12/28/2017   ALT 31 12/28/2017   ALBUMIN 4.0 12/28/2017   UDS Summary  Date Value Ref Range Status  08/06/2018 FINAL  Final    Comment:    ==================================================================== TOXASSURE SELECT 13 (MW) ==================================================================== Test                             Result       Flag       Units Drug Present and Declared for Prescription Verification   Oxycodone                      1457         EXPECTED   ng/mg creat   Oxymorphone                    918          EXPECTED   ng/mg creat   Noroxycodone                   2805         EXPECTED   ng/mg creat   Noroxymorphone                 480          EXPECTED   ng/mg creat    Sources of oxycodone are scheduled prescription medications.    Oxymorphone, noroxycodone, and noroxymorphone are expected    metabolites of oxycodone. Oxymorphone is also available as a    scheduled prescription medication. ==================================================================== Test                      Result    Flag   Units      Ref Range    Creatinine              202              mg/dL      >=20 ==================================================================== Declared Medications:  The flagging and interpretation on this report are based on the  following declared medications.  Unexpected results may arise from  inaccuracies in the declared medications.  **Note: The testing scope of this panel includes these medications:  Oxycodone (Percocet)  **Note: The testing scope of this panel does not include following  reported medications:  Acetaminophen  Acetaminophen (Percocet)  Albuterol  Atorvastatin  Celecoxib  Chlorthalidone  Gabapentin (Neurontin)  Meclizine (Antivert)  Methocarbamol (Robaxin)  Ondansetron (Zofran)  Potassium  Ranitidine  Sitagliptin ==================================================================== For clinical consultation, please call [phone]. ====================================================================    Note: Above Lab results reviewed.  Recent imaging  DG Lumbar Spine 2-3 Views CLINICAL DATA:  Status post surgical  posterior fusion.  EXAM: LUMBAR SPINE - 2-3 VIEW  COMPARISON:  Fluoroscopic images of April 02, 2018. CT scan of December 29, 2017.  FINDINGS: Status post surgical posterior fusion extending from L2-S2 with bilateral intrapedicular screw placement. Interbody fusion of L2-3, L3-4 and L4-5 is noted. Good alignment vertebral bodies is noted.  IMPRESSION: Status post surgical posterior fusion as described above.  Electronically Signed   By: Marijo Conception, M.D.   On: 04/04/2018 11:07  Assessment  The primary encounter diagnosis was Chronic pain syndrome. Diagnoses of Chronic low back pain (Primary Source of Pain) (Bilateral) (L>R), Chronic shoulder pain (Secondary area of Pain) (Bilateral) (L>R), Chronic foot pain (Third area of Pain) (Bilateral) (L>R), Chronic hand pain (Fourth area of Pain) (Bilateral) (R>L), Diabetic peripheral neuropathy (HCC),  and Neurogenic pain were also pertinent to this visit.  Plan of Care  I have discontinued Howard Murphy's gabapentin, ranitidine, acetaminophen, methocarbamol, and tiZANidine. I have also changed his oxyCODONE-acetaminophen and oxyCODONE-acetaminophen. Additionally, I am having him start on gabapentin. Lastly, I am having him maintain his sitaGLIPtin, atorvastatin, albuterol, chlorthalidone, potassium chloride, ondansetron, meclizine, celecoxib, tamsulosin, silodosin, lisinopril, testosterone cypionate, and oxyCODONE-acetaminophen.  Pharmacotherapy (Medications Ordered): Meds ordered this encounter  Medications  . oxyCODONE-acetaminophen (PERCOCET) 5-325 MG tablet    Sig: Take 1 tablet by mouth every 6 (six) hours as needed for up to 30 days for severe pain. Must last 30 days    Dispense:  120 tablet    Refill:  0    Chronic Pain. (STOP Act - Not applicable). Fill one day early if closed on scheduled refill date. Do not fill until: 01/05/19. To last until: 02/04/19.  Marland Kitchen oxyCODONE-acetaminophen (PERCOCET) 5-325 MG tablet    Sig: Take 1 tablet by mouth every 6 (six) hours as needed for up to 30 days for severe pain. Must last 30 days    Dispense:  120 tablet    Refill:  0    Chronic Pain. (STOP Act - Not applicable). Fill one day early if closed on scheduled refill date. Do not fill until: 12/06/18. To last until: 01/05/19.  Marland Kitchen oxyCODONE-acetaminophen (PERCOCET) 5-325 MG tablet    Sig: Take 1 tablet by mouth every 6 (six) hours as needed for up to 30 days for severe pain. Must last 30 days    Dispense:  120 tablet    Refill:  0    Chronic Pain. (STOP Act - Not applicable). Fill one day early if closed on scheduled refill date. Do not fill until: 11/06/18. To last until: 12/06/18.  Marland Kitchen gabapentin (NEURONTIN) 100 MG capsule    Sig: Take 1 capsule (100 mg total) by mouth at bedtime for 7 days, THEN 2 capsules (200 mg total) at bedtime for 7 days, THEN 3 capsules (300 mg total) at bedtime for 16  days. Follow written titration schedule.    Dispense:  69 capsule    Refill:  0    Fill one day early if pharmacy is closed on scheduled refill date. May substitute for generic if available.   Orders:  No orders of the defined types were placed in this encounter.  Follow-up plan:   Return in about 3 months (around 01/28/2019) for (3 mo) Med-Mgmt. Follow-up in 1 month for evaluation on the gabapentin trial.   I discussed the assessment and treatment plan with the patient. The patient was provided an opportunity to ask questions and all were answered. The patient agreed with the plan and demonstrated an understanding of  the instructions.  Patient advised to call back or seek an in-person evaluation if the symptoms or condition worsens.  Total duration of non-face-to-face encounter: 15 minutes.  Note by: Gaspar Cola, MD Date: 11/06/2018; Time: 9:21 AM  Note: This dictation was prepared with Dragon dictation. Any transcriptional errors that may result from this process are unintentional.  Disclaimer:  * Given the special circumstances of the COVID-19 pandemic, the federal government has announced that the Office for Civil Rights (OCR) will exercise its enforcement discretion and will not impose penalties on physicians using telehealth in the event of noncompliance with regulatory requirements under the [place] (HIPAA) in connection with the good faith provision of telehealth during the FWYOV-78 national public health emergency. ([place])

## 2018-11-04 NOTE — Telephone Encounter (Signed)
Patient notified to get Rapaflo

## 2018-11-04 NOTE — Addendum Note (Signed)
Addended by: Kyra Manges on: 11/04/2018 04:05 PM   Modules accepted: Orders

## 2018-11-04 NOTE — Telephone Encounter (Signed)
OK to do flomax 0.4mg  nightly x 14 days. Needs to be seen in ER if uncontrolled pain or fever over 101.3.  Thanks Nickolas Madrid, MD 11/04/2018

## 2018-11-04 NOTE — Telephone Encounter (Signed)
Patient notified and wil try Tamsulosin. He will go to ER if pain continues

## 2018-11-04 NOTE — Telephone Encounter (Signed)
Patient called the office back.  He is requesting Rapaflo, not Flomax.  He thinks that the Rapaflo works better. ([facility] in [place])  Please call patient and advise [phone]).

## 2018-11-05 ENCOUNTER — Encounter: Payer: Self-pay | Admitting: Pain Medicine

## 2018-11-05 ENCOUNTER — Telehealth: Payer: Self-pay | Admitting: *Deleted

## 2018-11-05 ENCOUNTER — Telehealth: Payer: Self-pay

## 2018-11-05 NOTE — Telephone Encounter (Signed)
Prior auth forms faxed to [facility] for coverage of Rapaflo. Confirmation received.

## 2018-11-05 NOTE — Telephone Encounter (Signed)
Incoming approval for Sildosin 8mg  capsule.  Ticket # [id number] ID# [id number]

## 2018-11-05 NOTE — Telephone Encounter (Signed)
Attempted to reach patient prior to VV on May 28. Voicemail left to please return the call.

## 2018-11-06 ENCOUNTER — Other Ambulatory Visit: Payer: Self-pay

## 2018-11-06 ENCOUNTER — Ambulatory Visit: Payer: Medicare Other | Attending: Nurse Practitioner | Admitting: Pain Medicine

## 2018-11-06 DIAGNOSIS — M545 Low back pain, unspecified: Secondary | ICD-10-CM

## 2018-11-06 DIAGNOSIS — G5793 Unspecified mononeuropathy of bilateral lower limbs: Secondary | ICD-10-CM

## 2018-11-06 DIAGNOSIS — M25511 Pain in right shoulder: Secondary | ICD-10-CM

## 2018-11-06 DIAGNOSIS — G8929 Other chronic pain: Secondary | ICD-10-CM | POA: Diagnosis not present

## 2018-11-06 DIAGNOSIS — E1142 Type 2 diabetes mellitus with diabetic polyneuropathy: Secondary | ICD-10-CM | POA: Diagnosis not present

## 2018-11-06 DIAGNOSIS — M79641 Pain in right hand: Secondary | ICD-10-CM

## 2018-11-06 DIAGNOSIS — M792 Neuralgia and neuritis, unspecified: Secondary | ICD-10-CM

## 2018-11-06 DIAGNOSIS — M25512 Pain in left shoulder: Secondary | ICD-10-CM

## 2018-11-06 DIAGNOSIS — G894 Chronic pain syndrome: Secondary | ICD-10-CM

## 2018-11-06 DIAGNOSIS — M79642 Pain in left hand: Secondary | ICD-10-CM | POA: Diagnosis not present

## 2018-11-06 MED ORDER — OXYCODONE-ACETAMINOPHEN 5-325 MG PO TABS: 1.0000 | ORAL_TABLET | Freq: Four times a day (QID) | ORAL | 0 refills | Status: DC | PRN

## 2018-11-06 MED ORDER — GABAPENTIN 100 MG PO CAPS: ORAL_CAPSULE | ORAL | 0 refills | Status: DC

## 2018-11-06 NOTE — Patient Instructions (Addendum)
____________________________________________________________________________________________  Medication Rules  Purpose: To inform patients, and their family members, of our rules and regulations.  Applies to: All patients receiving prescriptions (written or electronic).  Pharmacy of record: Pharmacy where electronic prescriptions will be sent. If written prescriptions are taken to a different pharmacy, please inform the nursing staff. The pharmacy listed in the electronic medical record should be the one where you would like electronic prescriptions to be sent.  Electronic prescriptions: In compliance with the [employer]reng[employer] Opioid Misuse Prevention (STOP) Act of 2017 (Session Law 2017-74/H243), effective June 11, 2018, all controlled substances must be electronically prescribed. Calling prescriptions to the pharmacy will cease to exist.  Prescription refills: Only during scheduled appointments. Applies to all prescriptions.  NOTE: The following applies primarily to controlled substances (Opioid* Pain Medications).   Patient's responsibilities: 1. Pain Pills: Bring all pain pills to every appointment (except for procedure appointments). 2. Pill Bottles: Bring pills in original pharmacy bottle. Always bring the newest bottle. Bring bottle, even if empty. 3. Medication refills: You are responsible for knowing and keeping track of what medications you take and those you need refilled. The day before your appointment: write a list of all prescriptions that need to be refilled. The day of the appointment: give the list to the admitting nurse. Prescriptions will be written only during appointments. No prescriptions will be written on procedure days. If you forget a medication: it will not be "Called in", "Faxed", or "electronically sent". You will need to get another appointment to get these prescribed. No early refills. Do not call asking to have your prescription filled  early. 4. Prescription Accuracy: You are responsible for carefully inspecting your prescriptions before leaving our office. Have the discharge nurse carefully go over each prescription with you, before taking them home. Make sure that your name is accurately spelled, that your address is correct. Check the name and dose of your medication to make sure it is accurate. Check the number of pills, and the written instructions to make sure they are clear and accurate. Make sure that you are given enough medication to last until your next medication refill appointment. 5. Taking Medication: Take medication as prescribed. When it comes to controlled substances, taking less pills or less frequently than prescribed is permitted and encouraged. Never take more pills than instructed. Never take medication more frequently than prescribed.  6. Inform other Doctors: Always inform, all of your healthcare providers, of all the medications you take. 7. Pain Medication from other Providers: You are not allowed to accept any additional pain medication from any other Doctor or Healthcare provider. There are two exceptions to this rule. (see below) In the event that you require additional pain medication, you are responsible for notifying us, as stated below. 8. Medication Agreement: You are responsible for carefully reading and following our Medication Agreement. This must be signed before receiving any prescriptions from our practice. Safely store a copy of your signed Agreement. Violations to the Agreement will result in no further prescriptions. (Additional copies of our Medication Agreement are available upon request.) 9. Laws, Rules, & Regulations: All patients are expected to follow all Federal and State Laws, Statutes, Rules, & Regulations. Ignorance of the Laws does not constitute a valid excuse. The use of any illegal substances is prohibited. 10. Adopted CDC guidelines & recommendations: Target dosing levels will be  at or below 60 MME/day. Use of benzodiazepines** is not recommended.  Exceptions: There are only two exceptions to the rule of not   receiving pain medications from other Healthcare Providers. 1. Exception #1 (Emergencies): In the event of an emergency (i.e.: accident requiring emergency care), you are allowed to receive additional pain medication. However, you are responsible for: As soon as you are able, call our office [phone], at any time of the day or night, and leave a message stating your name, the date and nature of the emergency, and the name and dose of the medication prescribed. In the event that your call is answered by a member of our staff, make sure to document and save the date, time, and the name of the person that took your information.  2. Exception #2 (Planned Surgery): In the event that you are scheduled by another doctor or dentist to have any type of surgery or procedure, you are allowed (for a period no longer than 30 days), to receive additional pain medication, for the acute post-op pain. However, in this case, you are responsible for picking up a copy of our "Post-op Pain Management for Surgeons" handout, and giving it to your surgeon or dentist. This document is available at our office, and does not require an appointment to obtain it. Simply go to our office during business hours (Monday-Thursday from 8:00 AM to 4:00 PM) (Friday 8:00 AM to 12:00 Noon) or if you have a scheduled appointment with us, prior to your surgery, and ask for it by name. In addition, you will need to provide us with your name, name of your surgeon, type of surgery, and date of procedure or surgery.  *Opioid medications include: morphine, codeine, oxycodone, oxymorphone, hydrocodone, hydromorphone, meperidine, tramadol, tapentadol, buprenorphine, fentanyl, methadone. **Benzodiazepine medications include: diazepam (Valium), alprazolam (Xanax), clonazepam (Klonopine), lorazepam (Ativan), clorazepate  (Tranxene), chlordiazepoxide (Librium), estazolam (Prosom), oxazepam (Serax), temazepam (Restoril), triazolam (Halcion) (Last updated: 08/08/2017) ____________________________________________________________________________________________   ____________________________________________________________________________________________  Medication Recommendations and Reminders  Applies to: All patients receiving prescriptions (written and/or electronic).  Medication Rules & Regulations: These rules and regulations exist for your safety and that of others. They are not flexible and neither are we. Dismissing or ignoring them will be considered "non-compliance" with medication therapy, resulting in complete and irreversible termination of such therapy. (See document titled "Medication Rules" for more details.) In all conscience, because of safety reasons, we cannot continue providing a therapy where the patient does not follow instructions.  Pharmacy of record:   Definition: This is the pharmacy where your electronic prescriptions will be sent.   We do not endorse any particular pharmacy.  You are not restricted in your choice of pharmacy.  The pharmacy listed in the electronic medical record should be the one where you want electronic prescriptions to be sent.  If you choose to change pharmacy, simply notify our nursing staff of your choice of new pharmacy.  Recommendations:  Keep all of your pain medications in a safe place, under lock and key, even if you live alone.   After you fill your prescription, take 1 week's worth of pills and put them away in a safe place. You should keep a separate, properly labeled bottle for this purpose. The remainder should be kept in the original bottle. Use this as your primary supply, until it runs out. Once it's gone, then you know that you have 1 week's worth of medicine, and it is time to come in for a prescription refill. If you do this correctly, it  is unlikely that you will ever run out of medicine.  To make sure that the above recommendation works,   it is very important that you make sure your medication refill appointments are scheduled at least 1 week before you run out of medicine. To do this in an effective manner, make sure that you do not leave the office without scheduling your next medication management appointment. Always ask the nursing staff to show you in your prescription , when your medication will be running out. Then arrange for the receptionist to get you a return appointment, at least 7 days before you run out of medicine. Do not wait until you have 1 or 2 pills left, to come in. This is very poor planning and does not take into consideration that we may need to cancel appointments due to bad weather, sickness, or emergencies affecting our staff.  "Partial Fill": If for any reason your pharmacy does not have enough pills/tablets to completely fill or refill your prescription, do not allow for a "partial fill". You will need a separate prescription to fill the remaining amount, which we will not provide. If the reason for the partial fill is your insurance, you will need to talk to the pharmacist about payment alternatives for the remaining tablets, but again, do not accept a partial fill.  Prescription refills and/or changes in medication(s):   Prescription refills, and/or changes in dose or medication, will be conducted only during scheduled medication management appointments. (Applies to both, written and electronic prescriptions.)  No refills on procedure days. No medication will be changed or started on procedure days. No changes, adjustments, and/or refills will be conducted on a procedure day. Doing so will interfere with the diagnostic portion of the procedure.  No phone refills. No medications will be "called into the pharmacy".  No Fax refills.  No weekend refills.  No Holliday refills.  No after hours  refills.  Remember:  Business hours are:  Monday to Thursday 8:00 AM to 4:00 PM Provider's Schedule: Crystal King, NP - Appointments are:  Medication management: Monday to Thursday 8:00 AM to 4:00 PM Tynasia Mccaul, MD - Appointments are:  Medication management: Monday and Wednesday 8:00 AM to 4:00 PM Procedure day: Tuesday and Thursday 7:30 AM to 4:00 PM Bilal Lateef, MD - Appointments are:  Medication management: Tuesday and Thursday 8:00 AM to 4:00 PM Procedure day: Monday and Wednesday 7:30 AM to 4:00 PM (Last update: 08/08/2017) ____________________________________________________________________________________________   ____________________________________________________________________________________________  CANNABIDIOL (AKA: CBD Oil or Pills)  Applies to: All patients receiving prescriptions of controlled substances (written and/or electronic).  General Information: Cannabidiol (CBD) was discovered in 1940. It is one of some 113 identified cannabinoids in cannabis (Marijuana) plants, accounting for up to 40% of the plant's extract. As of 2018, preliminary clinical research on cannabidiol included studies of anxiety, cognition, movement disorders, and pain.  Cannabidiol is consummed in multiple ways, including inhalation of cannabis smoke or vapor, as an aerosol spray into the cheek, and by mouth. It may be supplied as CBD oil containing CBD as the active ingredient (no added tetrahydrocannabinol (THC) or terpenes), a full-plant CBD-dominant hemp extract oil, capsules, dried cannabis, or as a liquid solution. CBD is thought not have the same psychoactivity as THC, and may affect the actions of THC. Studies suggest that CBD may interact with different biological targets, including cannabinoid receptors and other neurotransmitter receptors. As of 2018 the mechanism of action for its biological effects has not been determined.  In the [place], cannabidiol has a limited  approval by the Food and Drug Administration (FDA) for treatment of only two types   of epilepsy disorders. The side effects of long-term use of the drug include somnolence, decreased appetite, diarrhea, fatigue, malaise, weakness, sleeping problems, and others.  CBD remains a Schedule I drug prohibited for any use.  Legality: Some manufacturers ship CBD products nationally, an illegal action which the FDA has not enforced in 2018, with CBD remaining the subject of an FDA investigational new drug evaluation, and is not considered legal as a dietary supplement or food ingredient as of December 2018. Federal illegality has made it difficult historically to conduct research on CBD. CBD is openly sold in head shops and health food stores in some states where such sales have not been explicitly legalized.  Warning: Because it is not FDA approved for general use or treatment of pain, it is not required to undergo the same manufacturing controls as prescription drugs.  This means that the available cannabidiol (CBD) may be contaminated with THC.  If this is the case, it will trigger a positive urine drug screen (UDS) test for cannabinoids (Marijuana).  Because a positive UDS for illicit substances is a violation of our medication agreement, your opioid analgesics (pain medicine) may be permanently discontinued. (Last update: 08/29/2017) ____________________________________________________________________________________________   ____________________________________________________________________________________________  Initial Gabapentin Titration  Medication used: Gabapentin (Generic Name) or Neurontin (Brand Name) 100 mg tablets/capsules  Reasons to stop increasing the dose:  Reason 1: You get good relief of symptoms, in which case there is no need to increase the daily dose any further.    Reason 2: You develop some side effects, such as sleeping all of the time, difficulty concentrating, or  becoming disoriented, in which case you need to go down on the dose, to the prior level, where you were not experiencing any side effects. Stay on that dose longer, to allow more time for your body to get use it, before attempting to increase it again.   Steps: Step 1: Start by taking 1 (one) tablet at bedtime x 7 (seven) days.  Step 2: After being on 1 (one) tablet for 7 (seven) days, then increase it to 2 (two) tablets at bedtime for another 7 (seven) days.  Step 3: Next, after being on 2 (two) tablets at bedtime for 7 (seven) days, then increase it to 3 (three) tablets at bedtime, and stay on that dose until you see your doctor.  Reasons to stop increasing the dose: Reason 1: You get good relief of symptoms, in which case there is no need to increase the daily dose any further.  Reason 2: You develop some side effects, such as sleeping all of the time, difficulty concentrating, or becoming disoriented, in which case you need to go down on the dose, to the prior level, where you were not experiencing any side effects. Stay on that dose longer, to allow more time for your body to get use it, before attempting to increase it again.  Endpoint: Once you have reached the maximum dose you can tolerate without side-effects, contact your physician so as to evaluate the results of the regimen.   Questions: Feel free to contact us for any questions or problems at [phone] ____________________________________________________________________________________________

## 2018-11-10 ENCOUNTER — Ambulatory Visit: Payer: Medicare Other | Admitting: Urology

## 2018-12-02 ENCOUNTER — Encounter: Payer: Self-pay | Admitting: Pain Medicine

## 2018-12-03 ENCOUNTER — Other Ambulatory Visit: Payer: Self-pay

## 2018-12-03 ENCOUNTER — Ambulatory Visit: Payer: Medicare Other | Attending: Pain Medicine | Admitting: Pain Medicine

## 2018-12-03 ENCOUNTER — Encounter: Payer: Self-pay | Admitting: Emergency Medicine

## 2018-12-03 ENCOUNTER — Emergency Department
Admission: EM | Admit: 2018-12-03 | Discharge: 2018-12-04 | Disposition: A | Discharge status: Home / Self Care | Payer: Medicare Other | Attending: Emergency Medicine | Admitting: Emergency Medicine

## 2018-12-03 ENCOUNTER — Emergency Department: Payer: Medicare Other

## 2018-12-03 DIAGNOSIS — R1031 Right lower quadrant pain: Secondary | ICD-10-CM | POA: Diagnosis not present

## 2018-12-03 DIAGNOSIS — M792 Neuralgia and neuritis, unspecified: Secondary | ICD-10-CM | POA: Diagnosis not present

## 2018-12-03 DIAGNOSIS — E1142 Type 2 diabetes mellitus with diabetic polyneuropathy: Secondary | ICD-10-CM

## 2018-12-03 DIAGNOSIS — Z9049 Acquired absence of other specified parts of digestive tract: Secondary | ICD-10-CM | POA: Insufficient documentation

## 2018-12-03 DIAGNOSIS — Z79899 Other long term (current) drug therapy: Secondary | ICD-10-CM | POA: Diagnosis not present

## 2018-12-03 DIAGNOSIS — R109 Unspecified abdominal pain: Secondary | ICD-10-CM

## 2018-12-03 DIAGNOSIS — M545 Low back pain, unspecified: Secondary | ICD-10-CM

## 2018-12-03 DIAGNOSIS — G894 Chronic pain syndrome: Secondary | ICD-10-CM | POA: Diagnosis not present

## 2018-12-03 DIAGNOSIS — E119 Type 2 diabetes mellitus without complications: Secondary | ICD-10-CM | POA: Insufficient documentation

## 2018-12-03 DIAGNOSIS — G8929 Other chronic pain: Secondary | ICD-10-CM

## 2018-12-03 DIAGNOSIS — I10 Essential (primary) hypertension: Secondary | ICD-10-CM | POA: Diagnosis not present

## 2018-12-03 DIAGNOSIS — Z7984 Long term (current) use of oral hypoglycemic drugs: Secondary | ICD-10-CM | POA: Insufficient documentation

## 2018-12-03 DIAGNOSIS — Q7649 Other congenital malformations of spine, not associated with scoliosis: Secondary | ICD-10-CM

## 2018-12-03 LAB — COMPREHENSIVE METABOLIC PANEL
ALT: 39 U/L (ref 0–44)
AST: 36 U/L (ref 15–41)
Albumin: 4.4 g/dL (ref 3.5–5.0)
Alkaline Phosphatase: 54 U/L (ref 38–126)
Anion gap: 11 (ref 5–15)
BUN: 17 mg/dL (ref 8–23)
CO2: 26 mmol/L (ref 22–32)
Calcium: 9.8 mg/dL (ref 8.9–10.3)
Chloride: 100 mmol/L (ref 98–111)
Creatinine, Ser: 1.22 mg/dL (ref 0.61–1.24)
GFR calc Af Amer: >60 mL/min (ref >60)
GFR calc non Af Amer: >60 mL/min (ref >60)
Glucose, Bld: 171 mg/dL — ABNORMAL HIGH (ref 70–99)
Potassium: 3.2 mmol/L — ABNORMAL LOW (ref 3.5–5.1)
Sodium: 137 mmol/L (ref 135–145)
Total Bilirubin: 0.6 mg/dL (ref 0.3–1.2)
Total Protein: 7.6 g/dL (ref 6.5–8.1)

## 2018-12-03 LAB — CBC
HCT: 44.9 % (ref 39.0–52.0)
Hemoglobin: 15.1 g/dL (ref 13.0–17.0)
MCH: 28.9 pg (ref 26.0–34.0)
MCHC: 33.6 g/dL (ref 30.0–36.0)
MCV: 85.9 fL (ref 80.0–100.0)
Platelets: 222 10*3/uL (ref 150–400)
RBC: 5.23 MIL/uL (ref 4.22–5.81)
RDW: 14.2 % (ref 11.5–15.5)
WBC: 9.4 10*3/uL (ref 4.0–10.5)
nRBC: 0 % (ref 0.0–0.2)

## 2018-12-03 LAB — URINALYSIS, COMPLETE (UACMP) WITH MICROSCOPIC
Bacteria, UA: NONE SEEN
Bilirubin Urine: NEGATIVE
Glucose, UA: 50 mg/dL — AB
Hgb urine dipstick: NEGATIVE
Ketones, ur: NEGATIVE mg/dL
Leukocytes,Ua: NEGATIVE
Nitrite: NEGATIVE
Protein, ur: NEGATIVE mg/dL
Specific Gravity, Urine: 1.023 (ref 1.005–1.030)
pH: 6 (ref 5.0–8.0)

## 2018-12-03 LAB — LIPASE, BLOOD: Lipase: 46 U/L (ref 11–51)

## 2018-12-03 MED ORDER — GABAPENTIN 300 MG PO CAPS: 300.0000 mg | ORAL_CAPSULE | Freq: Every day | ORAL | 0 refills | Status: DC

## 2018-12-03 MED ORDER — KETOROLAC TROMETHAMINE 30 MG/ML IJ SOLN
30.0000 mg | Freq: Once | INTRAMUSCULAR | Status: AC
  Administered 2018-12-03: 30 mg via INTRAMUSCULAR
  Filled 2018-12-03: qty 1

## 2018-12-03 MED ORDER — SODIUM CHLORIDE 0.9% FLUSH: 3.0000 mL | Freq: Once | INTRAVENOUS | Status: DC

## 2018-12-03 MED ORDER — GABAPENTIN 100 MG PO CAPS: 100.0000 mg | ORAL_CAPSULE | Freq: Three times a day (TID) | ORAL | 0 refills | Status: DC

## 2018-12-03 NOTE — ED Provider Notes (Signed)
[facility] Emergency Department Provider Note  ____________________________________________   First MD Initiated Contact with Patient 12/03/18 2308     (approximate)  I have reviewed the triage vital signs and the nursing notes.   HISTORY  Chief Complaint Abdominal Pain    HPI JUSITN Murphy is a 63 y.o. male with medical history as listed below which notably includes an extensive history of chronic back pain as well as multiple episodes of prior kidney stones.  He sees Dr. Dossie Arbour for his chronic pain.  He presents tonight for evaluation of several days of pain in the right lower quadrant of his abdomen that he feels may be representative of kidney stones.  Last week he had a couple of episodes of gross hematuria.  This is resolved but he has had some persistent pain that he describes as severe.  Nothing in particular makes it better or worse.  He has had some nausea but no vomiting.  He denies fever, sore throat, chest pain, shortness of breath, and he denies any contact with COVID-19 patients.  He has not had any urinary hesitancy or increased frequency and denies dysuria.  He has required urological procedures in the past for his kidney stones but not recently and he has an established urologist in the area.        Past Medical History:  Diagnosis Date   Arthritis    Osteo vs rheumatoid ?   Back ache 05/05/2013   Diabetes mellitus without complication (HCC)    GERD (gastroesophageal reflux disease)    Headache    History of kidney stones    Hyperlipemia    Hypertension    Kidney stones    Kidney stones    Nephrolithiasis    Shortness of breath dyspnea     Patient Active Problem List   Diagnosis Date Noted   Neurogenic claudication 04/02/2018   Chronic bilateral low back pain with bilateral sciatica 03/05/2018   Spondylosis without myelopathy or radiculopathy, lumbosacral region 01/14/2018   Other specified dorsopathies,  sacral and sacrococcygeal region 12/26/2017   Acute appendicitis 12/18/2017   Medication monitoring encounter 03/07/2017   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) 02/28/2017   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   Spondylolisthesis at L3-L4 level 08/03/2016   Chronic pain syndrome 07/09/2016   Encounter for medication monitoring 02/24/2016   Cervical spondylosis 12/17/2015   Cervical facet arthropathy 12/17/2015   Chronic neck pain (Bilateral) (R>L) 12/17/2015   Cervical facet syndrome (Bilateral) (R>L) 12/17/2015   Chronic upper extremity pain (Bilateral) (R>L) 12/17/2015   Cervical foraminal stenosis 12/17/2015    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, C5-6) 12/17/2015   Arthropathy of shoulder (Right) 12/17/2015   Osteoarthritis 12/17/2015   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) 12/17/2015   Lumbar foraminal stenosis (L4-5) (Left) 12/17/2015   Grade 1 Anterolisthesis (2 mm) of L4 over L5. 12/17/2015   Lumbar facet (L4-5) synovial cyst (6 mm) (Left) 12/17/2015   Chronic sacroiliac joint pain (Bilateral) (L>R) 12/08/2015   Neurogenic pain 12/08/2015   Abnormal MRI, lumbar spine 11/21/2015   Radicular pain of shoulder (Right) 10/03/2015   Carpal tunnel syndrome (Bilateral) 10/03/2015   Chronic cervical radicular pain (Right) 10/03/2015   Chronic low back pain (Primary Source of Pain) (Bilateral) (L>R) 09/06/2015   Opiate use (60 MME/Day) 09/05/2015   Long term prescription opiate use 09/05/2015   Long term current use of opiate analgesic 09/05/2015   Spondylosis of lumbar spine 09/05/2015   Lumbar facet joint  syndrome ([place]) 09/05/2015   Chronic hip pain (Left) 09/05/2015   Diabetic peripheral neuropathy ([place]) 09/05/2015   Chronic lumbar radicular pain (S1 Dermatome) (Bilateral) (L>R) 09/05/2015   Hypokalemia 09/05/2015   Chronic shoulder pain (Secondary area of Pain) (Bilateral) (L>R) 09/05/2015   Diabetes  ([place]) 09/05/2015   Chronic foot pain (Third area of Pain) (Bilateral) (L>R) 09/05/2015   Chronic hand pain (Fourth area of Pain) (Bilateral) (R>L) 09/05/2015   Lumbar facet hypertrophy 09/05/2015   Encounter for therapeutic drug level monitoring 09/05/2015   Low testosterone 09/05/2015   Occipital pain (Right) 09/05/2015   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 05/29/2013    Class: History of   Benign fibroma of prostate 05/05/2013   GERD (gastroesophageal reflux disease) 05/05/2013   Benign prostatic hyperplasia 05/05/2013   Hypertension 05/08/2011   Hyperlipidemia 05/08/2011   RA (rheumatoid arthritis) ([place]) 05/08/2011    Past Surgical History:  Procedure Laterality Date   APPENDECTOMY     BACK SURGERY  2015   x3   BACK SURGERY  08/09/2016   BACK SURGERY Bilateral 2019   CHOLECYSTECTOMY  2000   COLONOSCOPY WITH PROPOFOL N/A 04/11/2015   Procedure: COLONOSCOPY WITH PROPOFOL;  Surgeon: Manya Silvas, MD;  Location[facility] ENDOSCOPY;  Service: Endoscopy;  Laterality: N/A;   CYSTOSCOPY WITH URETEROSCOPY AND STENT PLACEMENT Right 08/27/2017   Procedure: CYSTOSCOPY WITH URETEROSCOPY AND STENT PLACEMENT;  Surgeon: Abbie Sons, MD;  Location: [facility] ORS;  Service: Urology;  Laterality: Right;   ESOPHAGOGASTRODUODENOSCOPY (EGD) WITH PROPOFOL N/A 04/11/2015   Procedure: ESOPHAGOGASTRODUODENOSCOPY (EGD) WITH PROPOFOL;  Surgeon: Manya Silvas, MD;  Location: [facility] ENDOSCOPY;  Service: Endoscopy;  Laterality: N/A;   LAPAROSCOPIC  APPENDECTOMY N/A 12/18/2017   Procedure: APPENDECTOMY LAPAROSCOPIC;  Surgeon: Florene Glen, MD;  Location: [facility] ORS;  Service: General;  Laterality: N/A;   SHOULDER SURGERY Right 2016   SHOULDER SURGERY Left 06/2015    Prior to Admission medications   Medication Sig Start Date End Date Taking? Authorizing Provider  albuterol (PROVENTIL HFA;VENTOLIN HFA) 108 (90 Base) MCG/ACT inhaler Inhale 2 puffs into the lungs every 6 (six) hours as needed for wheezing or shortness of breath. 05/29/17   Fisher, Linden Dolin, PA-C  atorvastatin (LIPITOR) 40 MG tablet Take 1 tablet (40 mg total) by mouth at bedtime. 03/08/17   Arnetha Courser, MD  celecoxib (CELEBREX) 100 MG capsule Take 1 capsule (100 mg total) by mouth 2 (two) times daily. 04/06/18   Marin Olp, PA-C  chlorthalidone (HYGROTON) 50 MG tablet Take 50 mg by mouth daily.    [provider]  gabapentin (NEURONTIN) 100 MG capsule Take 1 capsule (100 mg total) by mouth 3 (three) times daily for 30 days. Follow written titration schedule 12/03/18 01/02/19  Milinda Pointer, MD  gabapentin (NEURONTIN) 300 MG capsule Take 1 capsule (300 mg total) by mouth at bedtime. 12/03/18 03/03/19  Milinda Pointer, MD  lisinopril (ZESTRIL) 20 MG tablet Take 20 mg by mouth daily. 09/18/18   [provider]  meclizine (ANTIVERT) 25 MG tablet Take 25 mg by mouth every 6 (six) hours  as needed for dizziness.    [provider]  ondansetron (ZOFRAN ODT) 4 MG disintegrating tablet Take 1 tablet (4 mg total) by mouth every 8 (eight) hours as needed for nausea or vomiting. 12/29/17   Darel Hong, MD  oxyCODONE-acetaminophen (PERCOCET) 5-325 MG tablet Take 1 tablet by mouth every 6 (six) hours as needed for up to 30 days for severe pain. Must last 30 days 01/05/19 02/04/19  Milinda Pointer, MD  oxyCODONE-acetaminophen (PERCOCET) 5-325 MG tablet Take 1 tablet by mouth every 6 (six) hours as needed for up to 30 days for severe pain. Must last 30  days 12/06/18 01/05/19  Milinda Pointer, MD  oxyCODONE-acetaminophen (PERCOCET) 5-325 MG tablet Take 1 tablet by mouth every 6 (six) hours as needed for up to 30 days for severe pain. Must last 30 days 11/06/18 12/06/18  Milinda Pointer, MD  potassium chloride (K-DUR) 10 MEQ tablet Take 1 tablet (10 mEq total) by mouth daily. Patient taking differently: Take 10 mEq by mouth daily as needed (For low potassium when instructred by his doctor.).  08/23/17   Stoioff, Ronda Fairly, MD  silodosin (RAPAFLO) 8 MG CAPS capsule Take 1 capsule (8 mg total) by mouth daily with breakfast. 11/04/18   Billey Co, MD  sitaGLIPtin (JANUVIA) 50 MG tablet Take 1 tablet (50 mg total) by mouth daily. 03/08/17   Arnetha Courser, MD  tamsulosin (FLOMAX) 0.4 MG CAPS capsule Take 1 capsule (0.4 mg total) by mouth daily. 11/04/18   Billey Co, MD  testosterone cypionate (DEPOTESTOSTERONE CYPIONATE) 200 MG/ML injection Inject 200 mg into the muscle every 14 (fourteen) days. 10/07/18   [provider]    Allergies Adhesive [tape]  Family History  Problem Relation Age of Onset   Cancer Mother        Breast CA   Stroke Father    Heart disease Father    Cancer Sister    Cancer Maternal Grandmother    Cancer Sister    Bladder Cancer Neg Hx    Prostate cancer Neg Hx    Kidney cancer Neg Hx     Social History Social History   Tobacco Use   Smoking status: Never Smoker   Smokeless tobacco: Former Systems developer    Types: Chew  Substance Use Topics   Alcohol use: No    Frequency: Never   Drug use: No    Review of Systems Constitutional: No fever/chills Eyes: No visual changes. ENT: No sore throat. Cardiovascular: Denies chest pain. Respiratory: Denies shortness of breath. Gastrointestinal: Right lower quadrant pain as described above.  Nausea, no vomiting.  No diarrhea.  No constipation. Genitourinary: Hematuria about a week ago, now resolved. Musculoskeletal: Negative for neck pain.   Negative for back pain. Integumentary: Negative for rash. Neurological: Negative for headaches, focal weakness or numbness.   ____________________________________________   PHYSICAL EXAM:  VITAL SIGNS: ED Triage Vitals  Enc Vitals Group     BP 12/03/18 2053 140/85     Pulse Rate 12/03/18 2053 86     Resp 12/03/18 2053 18     Temp 12/03/18 2053 98.9 F (37.2 C)     Temp Source 12/03/18 2053 Oral     SpO2 12/03/18 2053 96 %     Weight 12/03/18 2055 88.5 kg (195 lb)     Height 12/03/18 2055 1.702 m (5\' 7" )     Head Circumference --      Peak Flow --      Pain Score --  Pain Loc --      Pain Edu? --      Excl. in [place]? --     Constitutional: Alert and oriented. Well appearing and in no acute distress. Eyes: Conjunctivae are normal.  Head: Atraumatic. Nose: No congestion/rhinnorhea. Mouth/Throat: Mucous membranes are moist. Neck: No stridor.  No meningeal signs.   Cardiovascular: Normal rate, regular rhythm. Good peripheral circulation. Grossly normal heart sounds. Respiratory: Normal respiratory effort.  No retractions. No audible wheezing. Gastrointestinal: Soft and nontender. No distention.  Musculoskeletal: No lower extremity tenderness nor edema. No gross deformities of extremities. Neurologic:  Normal speech and language. No gross focal neurologic deficits are appreciated.  Skin:  Skin is warm, dry and intact. No rash noted. Psychiatric: Mood and affect are normal. Speech and behavior are normal.  ____________________________________________   LABS (all labs ordered are listed, but only abnormal results are displayed)  Labs Reviewed  COMPREHENSIVE METABOLIC PANEL - Abnormal; Notable for the following components:      Result Value   Potassium 3.2 (*)    Glucose, Bld 171 (*)    All other components within normal limits  URINALYSIS, COMPLETE (UACMP) WITH MICROSCOPIC - Abnormal; Notable for the following components:   Color, Urine YELLOW (*)    APPearance CLEAR  (*)    Glucose, UA 50 (*)    All other components within normal limits  LIPASE, BLOOD  CBC   ____________________________________________  EKG  No indication for EKG ____________________________________________  RADIOLOGY   ED MD interpretation:  No acute/emergent abnormalities on CT  Official radiology report(s): Ct Renal Stone Study  Result Date: 12/03/2018 CLINICAL DATA:  Flank pain EXAM: CT ABDOMEN AND PELVIS WITHOUT CONTRAST TECHNIQUE: Multidetector CT imaging of the abdomen and pelvis was performed following the standard protocol without IV contrast. COMPARISON:  CT 12/29/2017, 12/19/1998 FINDINGS: Lower chest: Lung bases demonstrate no acute consolidation or effusion. The heart size is within normal limits. Coronary vascular calcification. Hepatobiliary: No focal liver abnormality is seen. Status post cholecystectomy. No biliary dilatation. Pancreas: Unremarkable. No pancreatic ductal dilatation or surrounding inflammatory changes. Spleen: Normal in size without focal abnormality. Adrenals/Urinary Tract: Adrenal glands are within normal limits. No hydronephrosis. No ureteral stone. The bladder is normal. Stomach/Bowel: Stomach is within normal limits. Status post appendectomy. No evidence of bowel wall thickening, distention, or inflammatory changes. Vascular/Lymphatic: Mild aortic atherosclerosis. No aneurysm. No significantly enlarged lymph nodes. Reproductive: Prostate calcifications. Other: Negative for free air or free fluid. Within the bilateral inguinal canals. Small fat containing umbilical hernia. Musculoskeletal: Postsurgical changes of the lumbar spine from L2 through the mid sacrum with spinal rods, fixating screws and interbody devices L2-L3, L3-L4 and L4-L5. IMPRESSION: 1. No CT evidence for acute intra-abdominal or pelvic abnormality. Negative for hydronephrosis or ureteral stone. 2. Interval operative changes of lumbar spine with hardware not extending from L2 through the  mid sacrum. Electronically Signed   By: Donavan Foil M.D.   On: 12/03/2018 22:31    ____________________________________________   PROCEDURES   Procedure(s) performed (including Critical Care):  Procedures   ____________________________________________   INITIAL IMPRESSION / MDM / [place] / ED COURSE  As part of my medical decision making, I reviewed the following data within the [place] notes reviewed and incorporated, Labs reviewed , Old chart reviewed, Notes from prior ED visits and [place] Controlled Substance Database      *LARRY ALCOCK was evaluated in Emergency Department on 12/04/2018 for the symptoms described in the history of  present illness. He was evaluated in the context of the global COVID-19 pandemic, which necessitated consideration that the patient might be at risk for infection with the SARS-CoV-2 virus that causes COVID-19. Institutional protocols and algorithms that pertain to the evaluation of patients at risk for COVID-19 are in a state of rapid change based on information released by regulatory bodies including the CDC and federal and state organizations. These policies and algorithms were followed during the patient's care in the ED.  Some ED evaluations and interventions may be delayed as a result of limited staffing during the pandemic.*  Differential diagnosis includes, but is not limited to, ureteral colic with either present or recently passed stone, UTI/pyelonephritis, musculoskeletal pain, abdominal hernia, urological/scrotal issue, acute intra-abdominal infection such as diverticulitis.  The patient is well-appearing and in no apparent distress.  Vital signs are stable.  Lab work is essentially within normal limits with a slightly decreased potassium but otherwise normal.  No sign of infection or hematuria on urinalysis.  No leukocytosis, normal lipase.  CT renal protocol is within normal limits with no evidence of any  active or emergent condition.  The patient has a reassuring physical exam with no reproducible tenderness to palpation, no rebound and no guarding.  Given his reassuring evaluation I feel he is appropriate for discharge and outpatient follow-up.  I explained he may have passed a stone but that there is nothing present at this time.  He is comfortable with the plan for outpatient follow-up.  I encouraged him to use his regular pain medication and I am giving him Toradol 30 mg intramuscular prior to his discharge.  I gave my usual customary return precautions and he agrees with the plan.      ____________________________________________  FINAL CLINICAL IMPRESSION(S) / ED DIAGNOSES  Final diagnoses:  Right sided abdominal pain     MEDICATIONS GIVEN DURING THIS VISIT:  Medications  sodium chloride flush (NS) 0.9 % injection 3 mL ( Intravenous Canceled Entry 12/03/18 2203)  ketorolac (TORADOL) 30 MG/ML injection 30 mg (30 mg Intramuscular Given 12/03/18 2357)     ED Discharge Orders    None       Note:  This document was prepared using Dragon voice recognition software and may include unintentional dictation errors.   Hinda Kehr, MD 12/04/18 [phone]

## 2018-12-03 NOTE — ED Triage Notes (Addendum)
Patient coming in for right sides lower abd pain with pain shooting in groin x1 week. Patient states he had hematuria last week has not noticed it again but pain has persisted. Patient has chronic hx of kidney stones. Patient states he has had some nausea but has not vomited.

## 2018-12-03 NOTE — Progress Notes (Signed)
Pain Management Virtual Encounter Note - Virtual Visit via Telephone Telehealth (real-time audio visits between healthcare provider and patient).   Patient's Phone No. & Preferred Pharmacy:  [phone] (home); [phone] (mobile); (Preferred) [phone] No e-mail address on record  [address], [place] [address] Phone: [phone] Fax: [phone]    Pre-screening note:  Our staff contacted Mr. Greth and offered him an "in person", "face-to-face" appointment versus a telephone encounter. He indicated preferring the telephone encounter, at this time.   Reason for Virtual Visit: COVID-19*  Social distancing based on CDC and AMA recommendations.   I contacted [name] on 12/03/2018 via telephone.      I clearly identified myself as Gaspar Cola, MD. I verified that I was speaking with the correct person using two identifiers (Name: Howard Murphy, and date of birth: June 18, 1955).  Advanced Informed Consent I sought verbal advanced consent from [name] for virtual visit interactions. I informed Mr. Pendry of possible security and privacy concerns, risks, and limitations associated with providing "not-in-person" medical evaluation and management services. I also informed Mr. Colee of the availability of "in-person" appointments. Finally, I informed him that there would be a charge for the virtual visit and that he could be  personally, fully or partially, financially responsible for it. Mr. Hannold expressed understanding and agreed to proceed.   Historic Elements   Mr. HILMAR MOLDOVAN is a 64 y.o. year old, male patient evaluated today after his last encounter by our practice on 11/06/2018. Mr. Handley  has a past medical history of Arthritis, Back ache (05/05/2013), Diabetes mellitus without complication ([place]), GERD (gastroesophageal reflux disease), Headache,  History of kidney stones, Hyperlipemia, Hypertension, Kidney stones, Kidney stones, Nephrolithiasis, and Shortness of breath dyspnea. He also  has a past surgical history that includes Cholecystectomy (2000); Shoulder surgery (Right, 2016); Colonoscopy with propofol (N/A, 04/11/2015); Esophagogastroduodenoscopy (egd) with propofol (N/A, 04/11/2015); Shoulder surgery (Left, 06/2015); Back surgery (2015); Back surgery (08/09/2016); Cystoscopy with ureteroscopy and stent placement (Right, 08/27/2017); laparoscopic appendectomy (N/A, 12/18/2017); Appendectomy; and Back surgery (Bilateral, 2019). Mr. Delima has a current medication list which includes the following prescription(s): albuterol, atorvastatin, celecoxib, chlorthalidone, gabapentin, gabapentin, lisinopril, meclizine, ondansetron, oxycodone-acetaminophen, oxycodone-acetaminophen, oxycodone-acetaminophen, potassium chloride, silodosin, sitagliptin, tamsulosin, and testosterone cypionate. He  reports that he has never smoked. He quit smokeless tobacco use about 5 years ago.  His smokeless tobacco use included chew. He reports that he does not drink alcohol or use drugs. Mr. Berkley is allergic to adhesive [tape].   HPI  Today, he is being contacted for medication management.  Today I am contacting the patient to see how he is doing on his gabapentin titration.  The patient refers that he is doing okay with the medicine except for the fact that he gets a little sleepy during the day.  Currently he is up to 300 mg at bedtime.  I have recommended that he take it earlier in the evening so asked to avoid spillover of the sleepiness during the day.  Today I have provided him with a prescription for the 300 mg pill so that he only has to take 1 pill.  I have also provided him with a prescription for some of the 100 mg pills so that he can start titrating some of them during today.  I have instructed him to take it up to 3 times  during the day, but to go up slowly,  adding 1 tablet to the regimen every 7 days.  I have instructed him not to start doing that until his body gets used to the medication and the sleepiness goes away.  He indicates still having quite a bit of low back pain all across his lower back starting in the center and going towards right side.  He has evidence of a Bertolotti syndrome, bilaterally.  He has indicated that he is scheduled to see the neurosurgeon to see if there is anything that can be done about his problem.  Hopefully, they will be able to remove some of the transverse process material so as to avoid bone-on-bone interaction.  Pharmacotherapy Assessment  Analgesic: Oxycodone/APAP 5/325 mg 1 tablet by mouth 4 times a day (40 mg/dayof oxycodone) (30 MME/Day).  The patient should have enough medication to last until 02/04/2019. MME/day:30mg /day.   Monitoring: Pharmacotherapy: No side-effects or adverse reactions reported. [place] PMP: PDMP reviewed during this encounter.       Compliance: No problems identified. Effectiveness: Clinically acceptable. Plan: Refer to "POC".  Pertinent Labs   SAFETY SCREENING Profile Lab Results  Component Value Date   STAPHAUREUS NEGATIVE 03/25/2018   MRSAPCR NEGATIVE 03/25/2018   HIV Non Reactive 12/18/2017   Renal Function Lab Results  Component Value Date   BUN 16 03/25/2018   CREATININE 1.04 03/25/2018   BCR 14 03/07/2017   GFRAA >60 03/25/2018   GFRNONAA >60 03/25/2018   Hepatic Function Lab Results  Component Value Date   AST 31 12/28/2017   ALT 31 12/28/2017   ALBUMIN 4.0 12/28/2017   UDS Summary  Date Value Ref Range Status  08/06/2018 FINAL  Final    Comment:    ==================================================================== TOXASSURE SELECT 13 (MW) ==================================================================== Test                             Result       Flag       Units Drug Present and Declared for Prescription Verification   Oxycodone                       1457         EXPECTED   ng/mg creat   Oxymorphone                    918          EXPECTED   ng/mg creat   Noroxycodone                   2805         EXPECTED   ng/mg creat   Noroxymorphone                 480          EXPECTED   ng/mg creat    Sources of oxycodone are scheduled prescription medications.    Oxymorphone, noroxycodone, and noroxymorphone are expected    metabolites of oxycodone. Oxymorphone is also available as a    scheduled prescription medication. ==================================================================== Test                      Result    Flag   Units      Ref Range   Creatinine              202  mg/dL      >=20 ==================================================================== Declared Medications:  The flagging and interpretation on this report are based on the  following declared medications.  Unexpected results may arise from  inaccuracies in the declared medications.  **Note: The testing scope of this panel includes these medications:  Oxycodone (Percocet)  **Note: The testing scope of this panel does not include following  reported medications:  Acetaminophen  Acetaminophen (Percocet)  Albuterol  Atorvastatin  Celecoxib  Chlorthalidone  Gabapentin (Neurontin)  Meclizine (Antivert)  Methocarbamol (Robaxin)  Ondansetron (Zofran)  Potassium  Ranitidine  Sitagliptin ==================================================================== For clinical consultation, please call [phone]. ====================================================================    Note: Above Lab results reviewed.  Recent imaging  DG Lumbar Spine 2-3 Views CLINICAL DATA:  Status post surgical posterior fusion.  EXAM: LUMBAR SPINE - 2-3 VIEW  COMPARISON:  Fluoroscopic images of April 02, 2018. CT scan of December 29, 2017.  FINDINGS: Status post surgical posterior fusion extending from L2-S2 with bilateral intrapedicular screw  placement. Interbody fusion of L2-3, L3-4 and L4-5 is noted. Good alignment vertebral bodies is noted.  IMPRESSION: Status post surgical posterior fusion as described above.  Electronically Signed   By: Marijo Conception, M.D.   On: 04/04/2018 11:07  Assessment  The primary encounter diagnosis was Chronic pain syndrome. Diagnoses of Bertolotti's syndrome (L5-S1) (Bilateral), Chronic low back pain (Primary Source of Pain) (Bilateral) (L>R), Diabetic peripheral neuropathy ([place]), and Neurogenic pain were also pertinent to this visit.  Plan of Care  I have changed Monia Sabal. Mattson's gabapentin. I am also having him start on gabapentin. Additionally, I am having him maintain his sitaGLIPtin, atorvastatin, albuterol, chlorthalidone, potassium chloride, ondansetron, meclizine, celecoxib, tamsulosin, silodosin, lisinopril, testosterone cypionate, oxyCODONE-acetaminophen, oxyCODONE-acetaminophen, and oxyCODONE-acetaminophen.  Pharmacotherapy (Medications Ordered): Meds ordered this encounter  Medications  . gabapentin (NEURONTIN) 100 MG capsule    Sig: Take 1 capsule (100 mg total) by mouth 3 (three) times daily for 30 days. Follow written titration schedule    Dispense:  90 capsule    Refill:  0    Fill one day early if pharmacy is closed on scheduled refill date. May substitute for generic if available.  . gabapentin (NEURONTIN) 300 MG capsule    Sig: Take 1 capsule (300 mg total) by mouth at bedtime.    Dispense:  90 capsule    Refill:  0    Fill one day early if pharmacy is closed on scheduled refill date. May substitute for generic if available.   Orders:  No orders of the defined types were placed in this encounter.  Follow-up plan:   Return for scheduled appointment.    Recent Visits Date Type Provider Dept  11/06/18 Office Visit Milinda Pointer, MD [facility]  Showing recent visits within past 90 days and meeting all other requirements   Today's Visits Date Type  Provider Dept  12/03/18 Office Visit Milinda Pointer, MD [facility]  Showing today's visits and meeting all other requirements   Future Appointments Date Type Provider Dept  01/28/19 Appointment Milinda Pointer, MD [facility]  Showing future appointments within next 90 days and meeting all other requirements   I discussed the assessment and treatment plan with the patient. The patient was provided an opportunity to ask questions and all were answered. The patient agreed with the plan and demonstrated an understanding of the instructions.  Patient advised to call back or seek an in-person evaluation if the symptoms or condition worsens.  Total duration of non-face-to-face encounter:  12 minutes.  Note by: Gaspar Cola, MD Date: 12/03/2018; Time: 2:51 PM  Note: This dictation was prepared with Dragon dictation. Any transcriptional errors that may result from this process are unintentional.  Disclaimer:  * Given the special circumstances of the COVID-19 pandemic, the federal government has announced that the Office for Civil Rights (OCR) will exercise its enforcement discretion and will not impose penalties on physicians using telehealth in the event of noncompliance with regulatory requirements under the [place] (HIPAA) in connection with the good faith provision of telehealth during the RDEYC-14 national public health emergency. ([place])

## 2018-12-03 NOTE — Discharge Instructions (Signed)
You have been seen in the Emergency Department (ED) for abdominal pain.  Your evaluation did not identify a clear cause of your symptoms but was generally reassuring.  There was no evidence of anything to explain your pain on your CT scan (including no sign of kidney stones at this time).  You may have recently passed a stone and are still experiencing discomfort as a result, but this should improve over time.  Please follow up as instructed above regarding todays emergent visit and the symptoms that are bothering you.  Continue taking your regular pain medication prescribed by Dr. Consuela Mimes as per his instructions/recommendations.  Return to the ED if your abdominal pain worsens or fails to improve, you develop bloody vomiting, bloody diarrhea, you are unable to tolerate fluids due to vomiting, fever greater than 101, or other symptoms that concern you.

## 2018-12-16 NOTE — Progress Notes (Deleted)
12/17/2018 9:06 PM   Howard Murphy Jun 02, 1956 [id number]  Referring provider: [facility] [address],  [place] [address]  No chief complaint on file.   HPI: Howard Murphy is a 63 year old male with a history of nephrolithiasis who presents today for follow up.  History of nephrolithiasis Underwent right URS in 08/2017.  Stone analysis noted composition of 20% CaOxDi, 70% CaOxMono and 10% Ca Phos.  CT Renal stone study on 12/05/2018 noted no CT evidence for acute intra-abdominal or pelvic abnormality.  Negative for hydronephrosis or ureteral stone.  Interval operative changes of lumbar spine with hardware not extending from L2 through the mid sacrum.    PMH: Past Medical History:  Diagnosis Date  . 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  . Back ache 05/05/2013  . Diabetes mellitus without complication ([place])   . GERD (gastroesophageal reflux disease)   . Headache   . History of kidney stones   . Hyperlipemia   . Hypertension   . Kidney stones   . Kidney stones   . Nephrolithiasis   . Shortness of breath dyspnea     Surgical History: Past Surgical History:  Procedure Laterality Date  . APPENDECTOMY    . BACK SURGERY  2015   x3  . BACK SURGERY  08/09/2016  . BACK SURGERY Bilateral 2019  . CHOLECYSTECTOMY  2000  . COLONOSCOPY WITH PROPOFOL N/A 04/11/2015   Procedure: COLONOSCOPY WITH PROPOFOL;  Surgeon: Manya Silvas, MD;  Location: [facility] ENDOSCOPY;  Service: Endoscopy;  Laterality: N/A;  . CYSTOSCOPY WITH URETEROSCOPY AND STENT PLACEMENT Right 08/27/2017   Procedure: CYSTOSCOPY WITH URETEROSCOPY AND STENT PLACEMENT;  Surgeon: Abbie Sons, MD;  Location: [facility] ORS;  Service: Urology;  Laterality: Right;  . ESOPHAGOGASTRODUODENOSCOPY (EGD) WITH PROPOFOL N/A 04/11/2015   Procedure: ESOPHAGOGASTRODUODENOSCOPY (EGD) WITH PROPOFOL;  Surgeon: Manya Silvas, MD;  Location: [facility] ENDOSCOPY;  Service: Endoscopy;  Laterality: N/A;  .  LAPAROSCOPIC APPENDECTOMY N/A 12/18/2017   Procedure: APPENDECTOMY LAPAROSCOPIC;  Surgeon: Florene Glen, MD;  Location: [facility] ORS;  Service: General;  Laterality: N/A;  . SHOULDER SURGERY Right 2016  . SHOULDER SURGERY Left 06/2015    Home Medications:  Allergies as of 12/17/2018      Reactions   Adhesive [tape] Other (See Comments)   Honeycomb dressing, blisters      Medication List       Accurate as of December 16, 2018  9:06 PM. If you have any questions, ask your nurse or doctor.        albuterol 108 (90 Base) MCG/ACT inhaler Commonly known as: VENTOLIN HFA Inhale 2 puffs into the lungs every 6 (six) hours as needed for wheezing or shortness of breath.   atorvastatin 40 MG tablet Commonly known as: LIPITOR Take 1 tablet (40 mg total) by mouth at bedtime.   celecoxib 100 MG capsule Commonly known as: CELEBREX Take 1 capsule (100 mg total) by mouth 2 (two) times daily.   chlorthalidone 50 MG tablet Commonly known as: HYGROTON Take 50 mg by mouth daily.   gabapentin 100 MG capsule Commonly known as: Neurontin Take 1 capsule (100 mg total) by mouth 3 (three) times daily for 30 days. Follow written titration schedule   gabapentin 300 MG capsule Commonly known as: Neurontin Take 1 capsule (300 mg total) by mouth at bedtime.   lisinopril 20 MG tablet Commonly known as: ZESTRIL Take 20 mg by mouth daily.   meclizine 25  MG tablet Commonly known as: ANTIVERT Take 25 mg by mouth every 6 (six) hours as needed for dizziness.   ondansetron 4 MG disintegrating tablet Commonly known as: Zofran ODT Take 1 tablet (4 mg total) by mouth every 8 (eight) hours as needed for nausea or vomiting.   oxyCODONE-acetaminophen 5-325 MG tablet Commonly known as: Percocet Take 1 tablet by mouth every 6 (six) hours as needed for up to 30 days for severe pain. Must last 30 days   oxyCODONE-acetaminophen 5-325 MG tablet Commonly known as: Percocet Take 1 tablet by mouth every 6 (six) hours  as needed for up to 30 days for severe pain. Must last 30 days   oxyCODONE-acetaminophen 5-325 MG tablet Commonly known as: Percocet Take 1 tablet by mouth every 6 (six) hours as needed for up to 30 days for severe pain. Must last 30 days Start taking on: January 05, 2019   potassium chloride 10 MEQ tablet Commonly known as: K-DUR Take 1 tablet (10 mEq total) by mouth daily. What changed:   when to take this  reasons to take this   silodosin 8 MG Caps capsule Commonly known as: RAPAFLO Take 1 capsule (8 mg total) by mouth daily with breakfast.   sitaGLIPtin 50 MG tablet Commonly known as: Januvia Take 1 tablet (50 mg total) by mouth daily.   tamsulosin 0.4 MG Caps capsule Commonly known as: FLOMAX Take 1 capsule (0.4 mg total) by mouth daily.   testosterone cypionate 200 MG/ML injection Commonly known as: DEPOTESTOSTERONE CYPIONATE Inject 200 mg into the muscle every 14 (fourteen) days.       Allergies:  Allergies  Allergen Reactions  . Adhesive [Tape] Other (See Comments)    Honeycomb dressing, blisters    Family History: Family History  Problem Relation Age of Onset  . Cancer Mother        Breast CA  . Stroke Father   . Heart disease Father   . Cancer Sister   . Cancer Maternal Grandmother   . Cancer Sister   . Bladder Cancer Neg Hx   . Prostate cancer Neg Hx   . Kidney cancer Neg Hx     Social History:  reports that he has never smoked. He quit smokeless tobacco use about 5 years ago.  His smokeless tobacco use included chew. He reports that he does not drink alcohol or use drugs.  ROS:                                        Physical Exam: There were no vitals taken for this visit.  Constitutional:  Well nourished. Alert and oriented, No acute distress. HEENT: [place] AT, moist mucus membranes.  Trachea midline, no masses. Cardiovascular: No clubbing, cyanosis, or edema. Respiratory: Normal respiratory effort, no increased work of  breathing. GI: Abdomen is soft, non tender, non distended, no abdominal masses. Liver and spleen not palpable.  No hernias appreciated.  Stool sample for occult testing is not indicated.   GU: No CVA tenderness.  No bladder fullness or masses.  Patient with circumcised/uncircumcised phallus. ***Foreskin easily retracted***  Urethral meatus is patent.  No penile discharge. No penile lesions or rashes. Scrotum without lesions, cysts, rashes and/or edema.  Testicles are located scrotally bilaterally. No masses are appreciated in the testicles. Left and right epididymis are normal. Rectal: Patient with  normal sphincter tone. Anus and perineum without scarring or  rashes. No rectal masses are appreciated. Prostate is approximately *** grams, *** nodules are appreciated. Seminal vesicles are normal. Skin: No rashes, bruises or suspicious lesions. Lymph: No cervical or inguinal adenopathy. Neurologic: Grossly intact, no focal deficits, moving all 4 extremities. Psychiatric: Normal mood and affect.  Laboratory Data: Lab Results  Component Value Date   WBC 9.4 12/03/2018   HGB 15.1 12/03/2018   HCT 44.9 12/03/2018   MCV 85.9 12/03/2018   PLT 222 12/03/2018    Lab Results  Component Value Date   CREATININE 1.22 12/03/2018    No results found for: PSA  No results found for: TESTOSTERONE  Lab Results  Component Value Date   HGBA1C 6.9 (H) 03/07/2017    No results found for: TSH     Component Value Date/Time   CHOL 166 03/07/2017 1443   HDL 39 (L) 03/07/2017 1443   CHOLHDL 4.3 03/07/2017 1443   CHOLHDL 5.1 (H) 02/24/2016 1557   VLDL 48 (H) 02/24/2016 1557   LDLCALC 98 03/07/2017 1443    Lab Results  Component Value Date   AST 36 12/03/2018   Lab Results  Component Value Date   ALT 39 12/03/2018   No components found for: ALKALINEPHOPHATASE No components found for: BILIRUBINTOTAL  No results found for: ESTRADIOL  Urinalysis    Component Value Date/Time   COLORURINE  YELLOW (A) 12/03/2018 2106   APPEARANCEUR CLEAR (A) 12/03/2018 2106   APPEARANCEUR Clear 08/15/2017 1347   LABSPEC 1.023 12/03/2018 2106   LABSPEC 1.015 03/23/2014 2003   PHURINE 6.0 12/03/2018 2106   GLUCOSEU 50 (A) 12/03/2018 2106   GLUCOSEU 50 mg/dL 03/23/2014 2003   HGBUR NEGATIVE 12/03/2018 2106   BILIRUBINUR NEGATIVE 12/03/2018 2106   BILIRUBINUR Negative 08/15/2017 1347   [place] Negative 03/23/2014 2003   [place] NEGATIVE 12/03/2018 2106   PROTEINUR NEGATIVE 12/03/2018 2106   UROBILINOGEN 1.0 02/23/2015 2347   NITRITE NEGATIVE 12/03/2018 2106   LEUKOCYTESUR NEGATIVE 12/03/2018 2106   LEUKOCYTESUR Negative 03/23/2014 2003    I have reviewed the labs.   Pertinent Imaging: *** I have independently reviewed the films.    Assessment & Plan:  ***  1. History of nephrolithiasis ***  No follow-ups on file.  These notes generated with voice recognition software. I apologize for typographical errors.  Zara Council, PA-C  [facility] [address]  [place], [place] [address] [phone]

## 2018-12-17 ENCOUNTER — Ambulatory Visit: Payer: Medicare Other | Admitting: Urology

## 2018-12-18 DIAGNOSIS — E785 Hyperlipidemia, unspecified: Secondary | ICD-10-CM | POA: Diagnosis not present

## 2018-12-18 DIAGNOSIS — E1165 Type 2 diabetes mellitus with hyperglycemia: Secondary | ICD-10-CM | POA: Diagnosis not present

## 2018-12-18 DIAGNOSIS — I10 Essential (primary) hypertension: Secondary | ICD-10-CM | POA: Diagnosis not present

## 2018-12-19 DIAGNOSIS — R5383 Other fatigue: Secondary | ICD-10-CM | POA: Diagnosis not present

## 2019-01-06 ENCOUNTER — Other Ambulatory Visit: Payer: Self-pay | Admitting: Pain Medicine

## 2019-01-06 DIAGNOSIS — M792 Neuralgia and neuritis, unspecified: Secondary | ICD-10-CM

## 2019-01-06 DIAGNOSIS — E1142 Type 2 diabetes mellitus with diabetic polyneuropathy: Secondary | ICD-10-CM

## 2019-01-13 DIAGNOSIS — M1990 Unspecified osteoarthritis, unspecified site: Secondary | ICD-10-CM | POA: Diagnosis not present

## 2019-01-13 DIAGNOSIS — G894 Chronic pain syndrome: Secondary | ICD-10-CM | POA: Diagnosis not present

## 2019-01-13 DIAGNOSIS — E785 Hyperlipidemia, unspecified: Secondary | ICD-10-CM | POA: Diagnosis not present

## 2019-01-19 NOTE — Progress Notes (Deleted)
01/20/2019 11:03 AM   [name] Feb 15, 1956 [id number]  Referring provider: [facility], [facility] [place],  [place] [address]  No chief complaint on file.   HPI: Howard Murphy is a 63 y.o. male Caucasian with nephrolithiasis who presents today for a routine follow up.  He presented to the ED on 08/12/2017 with the complaint of gross hematuria and right flank pain that radiated to the right testicle.  His UA was positive for TNTC RBC's.  His WBC count was 10.0.  His creatinine was 0.84.  His potassium was found to be 2.6.  He states they gave him two potassium pills.    CT Renal stone study performed on 08/12/2017 noted adrenal glands are normal.  On the right, there is a 6 mm stone positioned within the right renal pelvis (series 2, image 37). Associated mild pelviectasis, suggesting that this be at least partially obstructive. Additional punctate nonobstructive calculus within the upper pole the right kidney. Right ureter decompressed distally with no other calculi Identified.  Faint punctate nonobstructive calculi present within the left kidney. No hydronephrosis. No radiopaque calculi seen along the course of the left renal collecting system. No left-sided hydroureter.  Partially distended bladder within normal limits. No layering stones within the bladder lumen.  On 08/15/2017, he was having right flank pain.  He stated that it began one month ago.  8/10 pain.  Nothing helped the pain.  Nothing made the pain worse.  He was having associated frequency, urgency, intermittency, straining to urinate, gross hematuria and a weak urinary stream.  Patient denied any dysuria.  Patient was having nausea, but he denied any fevers, chills or vomiting.  His UA was positive for 3-10 RBC's.    He does have a prior history of stones.  Stone composition is unknown.    In 03/19, he underwent a right ureteroscopy.  His renal pelvic calculus had migrated to the lower  pole.  He underwent basketing of both calculi; the stones were crystalline and broke up easily with the basket.  He had a ureteral stent placed, which was removed on 09/02/2017.  He stated that he had persistent pain while the stent was indwelling, however he had increasing pain on the stent's removal and was seen in the ED on 09/02/2017.  A nonconstrast CT was performed which showed faint punctate material at the UPJ and UVJ, with mild right hydronephrosis and hydroureter.  This was thought to be most likely due to ureteral edema, and was managed conservatively.  When he was seen by Dr. Bernardo Heater on 09/17/2017, he was still experiencing right lower quadrant abdominal pain and occasional nausea, and was using Zofran.  He denied fevers, chills, and gross hematuria.  ***  PMH: Past Medical History:  Diagnosis Date  . 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  . Back ache 05/05/2013  . Diabetes mellitus without complication ([place])   . GERD (gastroesophageal reflux disease)   . Headache   . History of kidney stones   . Hyperlipemia   . Hypertension   . Kidney stones   . Kidney stones   . Nephrolithiasis   . Shortness of breath dyspnea     Surgical History: Past Surgical History:  Procedure Laterality Date  . APPENDECTOMY    . BACK SURGERY  2015   x3  . BACK SURGERY  08/09/2016  . BACK SURGERY Bilateral 2019  . CHOLECYSTECTOMY  2000  . COLONOSCOPY WITH PROPOFOL N/A 04/11/2015  Procedure: COLONOSCOPY WITH PROPOFOL;  Surgeon: Manya Silvas, MD;  Location: [facility] ENDOSCOPY;  Service: Endoscopy;  Laterality: N/A;  . CYSTOSCOPY WITH URETEROSCOPY AND STENT PLACEMENT Right 08/27/2017   Procedure: CYSTOSCOPY WITH URETEROSCOPY AND STENT PLACEMENT;  Surgeon: Abbie Sons, MD;  Location: [facility] ORS;  Service: Urology;  Laterality: Right;  . ESOPHAGOGASTRODUODENOSCOPY (EGD) WITH PROPOFOL N/A 04/11/2015   Procedure: ESOPHAGOGASTRODUODENOSCOPY (EGD) WITH PROPOFOL;  Surgeon: Manya Silvas, MD;  Location:  [facility] ENDOSCOPY;  Service: Endoscopy;  Laterality: N/A;  . LAPAROSCOPIC APPENDECTOMY N/A 12/18/2017   Procedure: APPENDECTOMY LAPAROSCOPIC;  Surgeon: Florene Glen, MD;  Location: [facility] ORS;  Service: General;  Laterality: N/A;  . SHOULDER SURGERY Right 2016  . SHOULDER SURGERY Left 06/2015    Home Medications:  Allergies as of 01/20/2019      Reactions   Adhesive [tape] Other (See Comments)   Honeycomb dressing, blisters      Medication List       Accurate as of January 19, 2019 11:03 AM. If you have any questions, ask your nurse or doctor.        albuterol 108 (90 Base) MCG/ACT inhaler Commonly known as: VENTOLIN HFA Inhale 2 puffs into the lungs every 6 (six) hours as needed for wheezing or shortness of breath.   atorvastatin 40 MG tablet Commonly known as: LIPITOR Take 1 tablet (40 mg total) by mouth at bedtime.   celecoxib 100 MG capsule Commonly known as: CELEBREX Take 1 capsule (100 mg total) by mouth 2 (two) times daily.   chlorthalidone 50 MG tablet Commonly known as: HYGROTON Take 50 mg by mouth daily.   gabapentin 100 MG capsule Commonly known as: Neurontin Take 1 capsule (100 mg total) by mouth 3 (three) times daily for 30 days. Follow written titration schedule   gabapentin 300 MG capsule Commonly known as: Neurontin Take 1 capsule (300 mg total) by mouth at bedtime.   lisinopril 20 MG tablet Commonly known as: ZESTRIL Take 20 mg by mouth daily.   meclizine 25 MG tablet Commonly known as: ANTIVERT Take 25 mg by mouth every 6 (six) hours as needed for dizziness.   ondansetron 4 MG disintegrating tablet Commonly known as: Zofran ODT Take 1 tablet (4 mg total) by mouth every 8 (eight) hours as needed for nausea or vomiting.   oxyCODONE-acetaminophen 5-325 MG tablet Commonly known as: Percocet Take 1 tablet by mouth every 6 (six) hours as needed for up to 30 days for severe pain. Must last 30 days   oxyCODONE-acetaminophen 5-325 MG tablet Commonly  known as: Percocet Take 1 tablet by mouth every 6 (six) hours as needed for up to 30 days for severe pain. Must last 30 days   oxyCODONE-acetaminophen 5-325 MG tablet Commonly known as: Percocet Take 1 tablet by mouth every 6 (six) hours as needed for up to 30 days for severe pain. Must last 30 days   potassium chloride 10 MEQ tablet Commonly known as: K-DUR Take 1 tablet (10 mEq total) by mouth daily. What changed:   when to take this  reasons to take this   silodosin 8 MG Caps capsule Commonly known as: RAPAFLO Take 1 capsule (8 mg total) by mouth daily with breakfast.   sitaGLIPtin 50 MG tablet Commonly known as: Januvia Take 1 tablet (50 mg total) by mouth daily.   tamsulosin 0.4 MG Caps capsule Commonly known as: FLOMAX Take 1 capsule (0.4 mg total) by mouth daily.   testosterone cypionate 200 MG/ML injection Commonly known  as: DEPOTESTOSTERONE CYPIONATE Inject 200 mg into the muscle every 14 (fourteen) days.       Allergies:  Allergies  Allergen Reactions  . Adhesive [Tape] Other (See Comments)    Honeycomb dressing, blisters    Family History: Family History  Problem Relation Age of Onset  . Cancer Mother        Breast CA  . Stroke Father   . Heart disease Father   . Cancer Sister   . Cancer Maternal Grandmother   . Cancer Sister   . Bladder Cancer Neg Hx   . Prostate cancer Neg Hx   . Kidney cancer Neg Hx     Social History:  reports that he has never smoked. He quit smokeless tobacco use about 6 years ago.  His smokeless tobacco use included chew. He reports that he does not drink alcohol or use drugs.  ROS:                                        Physical Exam: There were no vitals taken for this visit.  Constitutional: Well nourished. Alert and oriented, No acute distress. HEENT: [place] AT, moist mucus membranes. Trachea midline, no masses. Cardiovascular: No clubbing, cyanosis, or edema. Respiratory: Normal  respiratory effort, no increased work of breathing. GI: Abdomen is soft, non tender, non distended, no abdominal masses. Liver and spleen not palpable.  No hernias appreciated.  Stool sample for occult testing is not indicated.   GU: No CVA tenderness.  No bladder fullness or masses.   Skin: No rashes, bruises or suspicious lesions. Lymph: No cervical or inguinal adenopathy. Neurologic: Grossly intact, no focal deficits, moving all 4 extremities. Psychiatric: Normal mood and affect.  Laboratory Data: Lab Results  Component Value Date   WBC 9.4 12/03/2018   HGB 15.1 12/03/2018   HCT 44.9 12/03/2018   MCV 85.9 12/03/2018   PLT 222 12/03/2018    Lab Results  Component Value Date   CREATININE 1.22 12/03/2018    No results found for: PSA  No results found for: TESTOSTERONE  Lab Results  Component Value Date   HGBA1C 6.9 (H) 03/07/2017    No results found for: TSH     Component Value Date/Time   CHOL 166 03/07/2017 1443   HDL 39 (L) 03/07/2017 1443   CHOLHDL 4.3 03/07/2017 1443   CHOLHDL 5.1 (H) 02/24/2016 1557   VLDL 48 (H) 02/24/2016 1557   LDLCALC 98 03/07/2017 1443    Lab Results  Component Value Date   AST 36 12/03/2018   Lab Results  Component Value Date   ALT 39 12/03/2018   No components found for: ALKALINEPHOPHATASE No components found for: BILIRUBINTOTAL  No results found for: ESTRADIOL  Urinalysis    Component Value Date/Time   COLORURINE YELLOW (A) 12/03/2018 2106   APPEARANCEUR CLEAR (A) 12/03/2018 2106   APPEARANCEUR Clear 08/15/2017 1347   LABSPEC 1.023 12/03/2018 2106   LABSPEC 1.015 03/23/2014 2003   PHURINE 6.0 12/03/2018 2106   GLUCOSEU 50 (A) 12/03/2018 2106   GLUCOSEU 50 mg/dL 03/23/2014 2003   HGBUR NEGATIVE 12/03/2018 2106   BILIRUBINUR NEGATIVE 12/03/2018 2106   BILIRUBINUR Negative 08/15/2017 [place] Negative 03/23/2014 2003   [place] NEGATIVE 12/03/2018 2106   PROTEINUR NEGATIVE 12/03/2018 2106   UROBILINOGEN  1.0 02/23/2015 2347   NITRITE NEGATIVE 12/03/2018 2106   LEUKOCYTESUR NEGATIVE 12/03/2018 2106  LEUKOCYTESUR Negative 03/23/2014 2003    I have reviewed the labs.   Pertinent Imaging: CLINICAL DATA:  Initial evaluation for acute right flank pain.  EXAM: CT ABDOMEN AND PELVIS WITHOUT CONTRAST  TECHNIQUE: Multidetector CT imaging of the abdomen and pelvis was performed following the standard protocol without IV contrast.  COMPARISON:  Prior CT from 04/16/2017.  FINDINGS: Lower chest: Mild scattered atelectatic changes within the right lung base. Visualized lungs are otherwise clear.  Hepatobiliary: Hepatic steatosis. Liver otherwise unremarkable. Gallbladder surgically absent. No biliary dilatation.  Pancreas: Pancreas within normal limits.  Spleen: Spleen within normal limits.  Adrenals/Urinary Tract: Adrenal glands are normal.  On the right, there is a 6 mm stone positioned within the right renal pelvis (series 2, image 37). Associated mild pelviectasis, suggesting that this be at least partially obstructive. Additional punctate nonobstructive calculus within the upper pole the right kidney. Right ureter decompressed distally with no other calculi identified.  Faint punctate nonobstructive calculi present within the left kidney. No hydronephrosis. No radiopaque calculi seen along the course of the left renal collecting system. No left-sided hydroureter.  Partially distended bladder within normal limits. No layering stones within the bladder lumen.  Stomach/Bowel: Stomach within normal limits. No evidence for bowel obstruction. Appendix is normal. No acute inflammatory changes seen about the bowels.  Vascular/Lymphatic: Intra-abdominal aorta of normal caliber. Mild aortic atherosclerosis. No adenopathy.  Reproductive: Prostate within normal limits.  Other: No free air or fluid. Small fat containing paraumbilical and bilateral inguinal hernias  without associated inflammation.  Musculoskeletal: No acute osseous abnormality. No worrisome lytic or blastic osseous lesions. Sequelae of prior PLIF at L3-4 and L4-5.  IMPRESSION: 1. 6 mm stone lodged within the right renal pelvis with secondary mild right pelviectasis. 2. Additional punctate nonobstructive right renal nephrolithiasis. 3. No other acute intra-abdominal or pelvic process.   Electronically Signed   By: Jeannine Boga M.D.   On: 08/12/2017 21:59   I have independently reviewed the films.    Assessment & Plan:    1. Right UPJ stone  -explained to the patient that AUA Guidelines for patients with uncomplicated ureteral stones ?10 mm should be offered observation, and those with distal stones of similar size should be offered MET with ?-blockers - he will continue the tamsulosin but he is wanting to move forward with definitive management  - explained that URS would be the first lined therapy if stone(s) do not pass, but ESWL is the procedure with the least morbidity and lowest complication rate, but URS has a greater stone-free rate in a single procedure - he would like to pursue right URS  - patient will be scheduled for right URS/LL/ureteral stent placement  - explained to the patient how the procedure is performed and the risks involved  - informed patient that they will have a stent placed during the procedure and will remain in place after the procedure for a short time.   - stent may be removed in the office with a cystoscope or patient may be instructed to remove the stent themselves by the string  - described "stent pain" as feelings of needing to urinate/overactive bladder and a warm, tingling sensation to intense pain in the affected flank  - residual stones within the kidney or ureter may be present after the procedure and may need to have these addressed at a different encounter  - injury to the ureter is the most common intra-operative risk, it may  result in an open procedure to correct  the defect  - infection and bleeding are also risks  - explained the risks of general anesthesia, such as: MI, CVA, paralysis, coma and/or death.  - advised to contact our office or seek treatment in the ED if becomes febrile or pain/ vomiting are difficult control in order to arrange for emergent/urgent intervention  2. Right hydronephrosis  - obtain RUS to ensure the hydronephrosis has resolved once he has recovered from URS  3. Gross hematuria  - UA today demonstrates 3-10 RBC's.    - continue to monitor the patient's UA after the treatment/passage of the stone to ensure the hematuria has resolved  - if hematuria persists, we will pursue a hematuria workup with CT Urogram and cystoscopy if appropriate.  - CULTURE, URINE COMPREHENSIVE  - Urinalysis, Complete  4. Bilateral renal stones  - punctate at this time no intervention warranted  - will offer a 24 hour metabolic study once he has recovered from URS   No follow-ups on file.  Zara Council, PA-C  [facility] [address] [place], [place] [address] [phone]

## 2019-01-20 ENCOUNTER — Ambulatory Visit: Payer: Medicare Other | Admitting: Urology

## 2019-01-27 ENCOUNTER — Encounter: Payer: Self-pay | Admitting: Pain Medicine

## 2019-01-27 NOTE — Progress Notes (Signed)
Pain Management Virtual Encounter Note - Virtual Visit via Telephone Telehealth (real-time audio visits between healthcare provider and patient).   Patient's Phone No. & Preferred Pharmacy:  [phone] (home); [phone] (mobile); (Preferred) [phone] No e-mail address on record  [address], [address] [place] [address] Phone: [phone] Fax: [phone]    Pre-screening note:  Our staff contacted Mr. Voeltz and offered him an "in person", "face-to-face" appointment versus a telephone encounter. He indicated preferring the telephone encounter, at this time.   Reason for Virtual Visit: COVID-19*  Social distancing based on CDC and AMA recommendations.   I contacted [name] on 01/28/2019 via telephone.      I clearly identified myself as Gaspar Cola, MD. I verified that I was speaking with the correct person using two identifiers (Name: RENJI BERWICK, and date of birth: [birth year]).  Advanced Informed Consent I sought verbal advanced consent from [name] for virtual visit interactions. I informed Mr. Kunzler of possible security and privacy concerns, risks, and limitations associated with providing "not-in-person" medical evaluation and management services. I also informed Mr. Gasper of the availability of "in-person" appointments. Finally, I informed him that there would be a charge for the virtual visit and that he could be  personally, fully or partially, financially responsible for it. Mr. Sally expressed understanding and agreed to proceed.   Historic Elements   Mr. AALIJAH LANPHERE is a 63 y.o. year old, male patient evaluated today after his last encounter by our practice on 01/06/2019. Mr. Brodowski  has a past medical history of Arthritis, Back ache (05/05/2013), Diabetes mellitus without complication ([place]), GERD (gastroesophageal reflux disease), Headache,  History of kidney stones, Hyperlipemia, Hypertension, Kidney stones, Kidney stones, Nephrolithiasis, and Shortness of breath dyspnea. He also  has a past surgical history that includes Cholecystectomy (2000); Shoulder surgery (Right, 2016); Colonoscopy with propofol (N/A, 04/11/2015); Esophagogastroduodenoscopy (egd) with propofol (N/A, 04/11/2015); Shoulder surgery (Left, 06/2015); Back surgery (2015); Back surgery (08/09/2016); Cystoscopy with ureteroscopy and stent placement (Right, 08/27/2017); laparoscopic appendectomy (N/A, 12/18/2017); Appendectomy; and Back surgery (Bilateral, 2019). Mr. Geimer has a current medication list which includes the following prescription(s): albuterol, atorvastatin, celecoxib, chlorthalidone, gabapentin, gabapentin, lisinopril, meclizine, ondansetron, potassium chloride, silodosin, sitagliptin, tamsulosin, testosterone cypionate, gabapentin, oxycodone, oxycodone, and oxycodone. He  reports that he has never smoked. He quit smokeless tobacco use about 6 years ago.  His smokeless tobacco use included chew. He reports that he does not drink alcohol or use drugs. Mr. Regina is allergic to adhesive [tape].   HPI  Today, he is being contacted for medication management.  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  Pharmacotherapy Assessment  Analgesic: Oxycodone IR 5 mg, 1 tab PO q 6 hrs (20 mg/dayof oxycodone) MME/day:30mg /day.   Monitoring: Pharmacotherapy: No side-effects or adverse reactions reported. [place] PMP: PDMP reviewed during this encounter.       Compliance: No problems identified. Effectiveness: Clinically acceptable. Plan: Refer to "POC".  UDS:  Summary  Date Value Ref Range Status  08/06/2018 FINAL  Final    Comment:    ==================================================================== TOXASSURE SELECT 13 (MW) ==================================================================== Test                              Result       Flag  Units Drug Present and Declared for Prescription Verification   Oxycodone                      1457         EXPECTED   ng/mg creat   Oxymorphone                    918          EXPECTED   ng/mg creat   Noroxycodone                   2805         EXPECTED   ng/mg creat   Noroxymorphone                 480          EXPECTED   ng/mg creat    Sources of oxycodone are scheduled prescription medications.    Oxymorphone, noroxycodone, and noroxymorphone are expected    metabolites of oxycodone. Oxymorphone is also available as a    scheduled prescription medication. ==================================================================== Test                      Result    Flag   Units      Ref Range   Creatinine              202              mg/dL      >=20 ==================================================================== Declared Medications:  The flagging and interpretation on this report are based on the  following declared medications.  Unexpected results may arise from  inaccuracies in the declared medications.  **Note: The testing scope of this panel includes these medications:  Oxycodone (Percocet)  **Note: The testing scope of this panel does not include following  reported medications:  Acetaminophen  Acetaminophen (Percocet)  Albuterol  Atorvastatin  Celecoxib  Chlorthalidone  Gabapentin (Neurontin)  Meclizine (Antivert)  Methocarbamol (Robaxin)  Ondansetron (Zofran)  Potassium  Ranitidine  Sitagliptin ==================================================================== For clinical consultation, please call [phone]. ====================================================================    Laboratory Chemistry Profile (12 mo)  Renal: 12/03/2018: BUN 17; Creatinine, Ser 1.22  Lab Results  Component Value Date   GFRAA >60 12/03/2018   GFRNONAA >60 12/03/2018   Hepatic: 12/03/2018: Albumin 4.4 Lab Results  Component Value Date   AST 36  12/03/2018   ALT 39 12/03/2018   Other: No results found for requested labs within last 8760 hours. Note: Above Lab results reviewed.  Imaging  Last 90 days:  Ct Renal Stone Study  Result Date: 12/03/2018 CLINICAL DATA:  Flank pain EXAM: CT ABDOMEN AND PELVIS WITHOUT CONTRAST TECHNIQUE: Multidetector CT imaging of the abdomen and pelvis was performed following the standard protocol without IV contrast. COMPARISON:  CT 12/29/2017, 12/19/1998 FINDINGS: Lower chest: Lung bases demonstrate no acute consolidation or effusion. The heart size is within normal limits. Coronary vascular calcification. Hepatobiliary: No focal liver abnormality is seen. Status post cholecystectomy. No biliary dilatation. Pancreas: Unremarkable. No pancreatic ductal dilatation or surrounding inflammatory changes. Spleen: Normal in size without focal abnormality. Adrenals/Urinary Tract: Adrenal glands are within normal limits. No hydronephrosis. No ureteral stone. The bladder is normal. Stomach/Bowel: Stomach is within normal limits. Status post appendectomy. No evidence of bowel wall thickening, distention, or inflammatory changes. Vascular/Lymphatic: Mild aortic atherosclerosis. No aneurysm. No significantly enlarged lymph nodes. Reproductive: Prostate calcifications. Other: Negative for free air or free fluid. Within the bilateral  inguinal canals. Small fat containing umbilical hernia. Musculoskeletal: Postsurgical changes of the lumbar spine from L2 through the mid sacrum with spinal rods, fixating screws and interbody devices L2-L3, L3-L4 and L4-L5. IMPRESSION: 1. No CT evidence for acute intra-abdominal or pelvic abnormality. Negative for hydronephrosis or ureteral stone. 2. Interval operative changes of lumbar spine with hardware not extending from L2 through the mid sacrum. Electronically Signed   By: Donavan Foil M.D.   On: 12/03/2018 22:31    Assessment  The primary encounter diagnosis was Chronic pain syndrome.  Diagnoses of Chronic low back pain (Primary Area of Pain) (Bilateral) (L>R), Chronic shoulder pain (Secondary area of Pain) (Bilateral) (L>R), Chronic foot pain (Third area of Pain) (Bilateral) (L>R), Chronic hand pain (Fourth area of Pain) (Bilateral) (R>L), Neurogenic pain, and Diabetic peripheral neuropathy (HCC) were also pertinent to this visit.  Plan of Care  I have discontinued Shanon Brow C. Medine's oxyCODONE-acetaminophen, oxyCODONE-acetaminophen, and oxyCODONE-acetaminophen. I am also having him start on oxyCODONE, oxyCODONE, oxyCODONE, and gabapentin. Additionally, I am having him maintain his sitaGLIPtin, atorvastatin, albuterol, chlorthalidone, potassium chloride, ondansetron, meclizine, celecoxib, tamsulosin, silodosin, lisinopril, testosterone cypionate, gabapentin, and gabapentin.  Pharmacotherapy (Medications Ordered): Meds ordered this encounter  Medications  . oxyCODONE (OXY IR/ROXICODONE) 5 MG immediate release tablet    Sig: Take 1 tablet (5 mg total) by mouth every 6 (six) hours as needed for severe pain. Must last 30 days.    Dispense:  120 tablet    Refill:  0    Chronic Pain: STOP Act (Not applicable) Fill 1 day early if closed on refill date. Do not fill until: 02/04/2019. To last until: 03/06/2019. Avoid benzodiazepines within 8 hours of opioids  . oxyCODONE (OXY IR/ROXICODONE) 5 MG immediate release tablet    Sig: Take 1 tablet (5 mg total) by mouth every 6 (six) hours as needed for severe pain. Must last 30 days.    Dispense:  120 tablet    Refill:  0    Chronic Pain: STOP Act (Not applicable) Fill 1 day early if closed on refill date. Do not fill until: 03/06/2019. To last until: 04/05/2019. Avoid benzodiazepines within 8 hours of opioids  . oxyCODONE (OXY IR/ROXICODONE) 5 MG immediate release tablet    Sig: Take 1 tablet (5 mg total) by mouth every 6 (six) hours as needed for severe pain. Must last 30 days.    Dispense:  120 tablet    Refill:  0    Chronic Pain: STOP Act  (Not applicable) Fill 1 day early if closed on refill date. Do not fill until: 04/05/2019. To last until: 05/05/2019. Avoid benzodiazepines within 8 hours of opioids  . gabapentin (NEURONTIN) 800 MG tablet    Sig: Take 1 tablet (800 mg total) by mouth every 8 (eight) hours.    Dispense:  90 tablet    Refill:  2    Fill one day early if pharmacy is closed on scheduled refill date. May substitute for generic if available.   Orders:  No orders of the defined types were placed in this encounter.  Follow-up plan:   Return in about 3 months (around 05/04/2019) for (VV), E/M (MM).      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   Considering:   Diagnostic bilateral cervical facet block Possible bilateral cervical facet  radiofrequency ablation  Diagnostic right-sided greater occipital nerve block Possible right-sided greater occipital nerve radiofrequencyablation  Possible right-sided peripheral nerve stimulator trial  Diagnostic right-sided cervical epidural steroid injection  Diagnostic bilateral intra-articular shoulder injection Diagnostic bilateral suprascapular nerve block Possible bilateral suprascapular nerve radiofrequencyablation  Diagnostic bilateral sacroiliac joint block Possible bilateral sacroiliac joint radiofrequencyablation  Diagnostic bilateral lumbar facet block Possible bilateral lumbar facet radiofrequencyablation  Diagnostic left L4-5 transforaminal epiduralsteroid injection  Diagnostic left-sided L4-5 lumbar epidural steroid injection  Diagnostic left intra-articular hip joint injection Possible left hip joint radiofrequencyablation  Diagnostic bilateral median nerve block(carpal tunnel block)  Diagnostic  right-sided celiac plexus block   Palliative PRN treatment(s):   Palliative/Diagnostic Left SI Block (Area of L5 Transverse process spatulation) #2  Palliative IM Toradol/Norflex 60/60 (PRN)     Recent Visits Date Type Provider Dept  12/03/18 Office Visit Milinda Pointer, MD [facility]  11/06/18 Office Visit Milinda Pointer, MD [facility]  Showing recent visits within past 90 days and meeting all other requirements   Today's Visits Date Type Provider Dept  01/28/19 Office Visit Milinda Pointer, MD [facility]  Showing today's visits and meeting all other requirements   Future Appointments No visits were found meeting these conditions.  Showing future appointments within next 90 days and meeting all other requirements   I discussed the assessment and treatment plan with the patient. The patient was provided an opportunity to ask questions and all were answered. The patient agreed with the plan and demonstrated an understanding of the instructions.  Patient advised to call back or seek an in-person evaluation if the symptoms or condition worsens.  Total duration of non-face-to-face encounter: 13 minutes.  Note by: Gaspar Cola, MD Date: 01/28/2019; Time: 11:23 AM  Note: This dictation was prepared with Dragon dictation. Any transcriptional errors that may result from this process are unintentional.  Disclaimer:  * Given the special circumstances of the COVID-19 pandemic, the federal government has announced that the Office for Civil Rights (OCR) will exercise its enforcement discretion and will not impose penalties on physicians using telehealth in the event of noncompliance with regulatory requirements under the [place] (HIPAA) in connection with the good faith provision of telehealth during the BVQXI-50 national public health emergency. ([place])

## 2019-01-28 ENCOUNTER — Other Ambulatory Visit: Payer: Self-pay

## 2019-01-28 ENCOUNTER — Ambulatory Visit: Payer: Medicare Other | Attending: Pain Medicine | Admitting: Pain Medicine

## 2019-01-28 DIAGNOSIS — M79642 Pain in left hand: Secondary | ICD-10-CM | POA: Diagnosis not present

## 2019-01-28 DIAGNOSIS — G894 Chronic pain syndrome: Secondary | ICD-10-CM

## 2019-01-28 DIAGNOSIS — G8929 Other chronic pain: Secondary | ICD-10-CM

## 2019-01-28 DIAGNOSIS — M25511 Pain in right shoulder: Secondary | ICD-10-CM | POA: Diagnosis not present

## 2019-01-28 DIAGNOSIS — E1142 Type 2 diabetes mellitus with diabetic polyneuropathy: Secondary | ICD-10-CM

## 2019-01-28 DIAGNOSIS — M545 Low back pain: Secondary | ICD-10-CM | POA: Diagnosis not present

## 2019-01-28 DIAGNOSIS — M25512 Pain in left shoulder: Secondary | ICD-10-CM

## 2019-01-28 DIAGNOSIS — M79641 Pain in right hand: Secondary | ICD-10-CM

## 2019-01-28 DIAGNOSIS — M792 Neuralgia and neuritis, unspecified: Secondary | ICD-10-CM

## 2019-01-28 DIAGNOSIS — G5793 Unspecified mononeuropathy of bilateral lower limbs: Secondary | ICD-10-CM

## 2019-01-28 MED ORDER — OXYCODONE HCL 5 MG PO TABS: 5.0000 mg | ORAL_TABLET | Freq: Four times a day (QID) | ORAL | 0 refills | Status: DC | PRN

## 2019-01-28 MED ORDER — GABAPENTIN 800 MG PO TABS: 800.0000 mg | ORAL_TABLET | Freq: Three times a day (TID) | ORAL | 2 refills | Status: DC

## 2019-02-04 ENCOUNTER — Telehealth: Payer: Self-pay | Admitting: *Deleted

## 2019-02-04 NOTE — Telephone Encounter (Addendum)
Patients wife called stating she has been waiting at the pharmacy for a nurse to call her back, I explained the nurses are with patients. She has not picked up the meds yet. They would like Nurse to call and confirm that Dr. Dossie Arbour meant to change med for 5.325 to 5. She states she will pick up meds but would still like to speak with Nurse.

## 2019-02-04 NOTE — Telephone Encounter (Signed)
I see in Dr. Adalberto Cole note that the med was changed, but I don't see any notation about why it was changed. Need to ask Dr. Dossie Arbour about this.

## 2019-02-04 NOTE — Telephone Encounter (Signed)
The patients wife called back and said he wants to speak to a nurse about this please. His number is [phone]

## 2019-02-04 NOTE — Telephone Encounter (Signed)
Attempted to call patient back.  Left message. Read Dr Cleda Daub notes and he changed the oxycodone/Acetaminophen to plain oxycodone.

## 2019-02-15 ENCOUNTER — Emergency Department
Admission: EM | Admit: 2019-02-15 | Discharge: 2019-02-15 | Disposition: A | Discharge status: Home / Self Care | Payer: Medicare Other | Attending: Emergency Medicine | Admitting: Emergency Medicine

## 2019-02-15 ENCOUNTER — Emergency Department: Payer: Medicare Other

## 2019-02-15 ENCOUNTER — Encounter: Payer: Self-pay | Admitting: Emergency Medicine

## 2019-02-15 ENCOUNTER — Other Ambulatory Visit: Payer: Self-pay

## 2019-02-15 DIAGNOSIS — R0789 Other chest pain: Secondary | ICD-10-CM | POA: Diagnosis not present

## 2019-02-15 DIAGNOSIS — R0602 Shortness of breath: Secondary | ICD-10-CM | POA: Diagnosis not present

## 2019-02-15 DIAGNOSIS — I10 Essential (primary) hypertension: Secondary | ICD-10-CM | POA: Insufficient documentation

## 2019-02-15 DIAGNOSIS — F419 Anxiety disorder, unspecified: Secondary | ICD-10-CM | POA: Diagnosis not present

## 2019-02-15 DIAGNOSIS — E1140 Type 2 diabetes mellitus with diabetic neuropathy, unspecified: Secondary | ICD-10-CM | POA: Insufficient documentation

## 2019-02-15 DIAGNOSIS — Z87891 Personal history of nicotine dependence: Secondary | ICD-10-CM | POA: Insufficient documentation

## 2019-02-15 DIAGNOSIS — Z79899 Other long term (current) drug therapy: Secondary | ICD-10-CM | POA: Diagnosis not present

## 2019-02-15 DIAGNOSIS — R079 Chest pain, unspecified: Secondary | ICD-10-CM | POA: Diagnosis not present

## 2019-02-15 LAB — CBC
HCT: 48.5 % (ref 39.0–52.0)
Hemoglobin: 15.9 g/dL (ref 13.0–17.0)
MCH: 28.2 pg (ref 26.0–34.0)
MCHC: 32.8 g/dL (ref 30.0–36.0)
MCV: 86 fL (ref 80.0–100.0)
Platelets: 219 10*3/uL (ref 150–400)
RBC: 5.64 MIL/uL (ref 4.22–5.81)
RDW: 14.6 % (ref 11.5–15.5)
WBC: 7.8 10*3/uL (ref 4.0–10.5)
nRBC: 0 % (ref 0.0–0.2)

## 2019-02-15 LAB — BASIC METABOLIC PANEL
Anion gap: 8 (ref 5–15)
BUN: 16 mg/dL (ref 8–23)
CO2: 24 mmol/L (ref 22–32)
Calcium: 9.1 mg/dL (ref 8.9–10.3)
Chloride: 105 mmol/L (ref 98–111)
Creatinine, Ser: 0.95 mg/dL (ref 0.61–1.24)
GFR calc Af Amer: >60 mL/min (ref >60)
GFR calc non Af Amer: >60 mL/min (ref >60)
Glucose, Bld: 194 mg/dL — ABNORMAL HIGH (ref 70–99)
Potassium: 3.7 mmol/L (ref 3.5–5.1)
Sodium: 137 mmol/L (ref 135–145)

## 2019-02-15 LAB — HEPATIC FUNCTION PANEL
ALT: 32 U/L (ref 0–44)
AST: 36 U/L (ref 15–41)
Albumin: 4.1 g/dL (ref 3.5–5.0)
Alkaline Phosphatase: 45 U/L (ref 38–126)
Bilirubin, Direct: 0.2 mg/dL (ref 0.0–0.2)
Indirect Bilirubin: 0.5 mg/dL (ref 0.3–0.9)
Total Bilirubin: 0.7 mg/dL (ref 0.3–1.2)
Total Protein: 7.1 g/dL (ref 6.5–8.1)

## 2019-02-15 LAB — TROPONIN I (HIGH SENSITIVITY)
Troponin I (High Sensitivity): 5 ng/L (ref <18)
Troponin I (High Sensitivity): 4 ng/L (ref <18)

## 2019-02-15 MED ORDER — KETOROLAC TROMETHAMINE 30 MG/ML IJ SOLN
30.0000 mg | Freq: Once | INTRAMUSCULAR | Status: AC
  Administered 2019-02-15: 30 mg via INTRAVENOUS
  Filled 2019-02-15: qty 1

## 2019-02-15 MED ORDER — SODIUM CHLORIDE 0.9% FLUSH: 3.0000 mL | Freq: Once | INTRAVENOUS | Status: DC

## 2019-02-15 MED ORDER — HYDROXYZINE HCL 25 MG PO TABS: 25.0000 mg | ORAL_TABLET | Freq: Three times a day (TID) | ORAL | 0 refills | Status: DC | PRN

## 2019-02-15 MED ORDER — OXYCODONE-ACETAMINOPHEN 5-325 MG PO TABS
1.0000 | ORAL_TABLET | Freq: Once | ORAL | Status: AC
  Administered 2019-02-15: 1 via ORAL
  Filled 2019-02-15: qty 1

## 2019-02-15 MED ORDER — LORAZEPAM 2 MG/ML IJ SOLN
0.5000 mg | Freq: Once | INTRAMUSCULAR | Status: AC
  Administered 2019-02-15: 0.5 mg via INTRAVENOUS
  Filled 2019-02-15: qty 1

## 2019-02-15 NOTE — ED Provider Notes (Signed)
[facility] Emergency Department Provider Note  ____________________________________________   First MD Initiated Contact with Patient 02/15/19 [phone]     (approximate)  I have reviewed the triage vital signs and the nursing notes.   HISTORY  Chief Complaint Chest Pain and Shortness of Breath    HPI Howard Murphy is a 63 y.o. male with diabetes, hypertension, hyperlipidemia who presents with chest pain.  Patient denies having issues with his heart previously.  Last stress test was 8 years ago.  Patient has had intermittent episodes where he feels like he can cannot catch his breath.  These are often triggered by being in close spaces.  He says these can last like for a few days.  However this time it was associated with some chest pressure sensation that he is never had before.  The pressure is moderate, constant since yesterday, nothing makes it better, nothing makes it worse.  Patient does take oxycodone for chronic back pain.  Denies any abdominal pain, vomiting, fevers.   Past Medical History:  Diagnosis Date  . 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  . Back ache 05/05/2013  . Diabetes mellitus without complication ([place])   . GERD (gastroesophageal reflux disease)   . Headache   . History of kidney stones   . Hyperlipemia   . Hypertension   . Kidney stones   . Kidney stones   . Nephrolithiasis   . Shortness of breath dyspnea     Patient Active Problem List   Diagnosis Date Noted  . Neurogenic claudication 04/02/2018  . Chronic bilateral low back pain with bilateral sciatica 03/05/2018  . Spondylosis without myelopathy or radiculopathy, lumbosacral region 01/14/2018  . Other specified dorsopathies, sacral and sacrococcygeal region 12/26/2017  . Acute appendicitis 12/18/2017  . Medication monitoring encounter 03/07/2017  . 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  Spondylolisthesis at L3-L4 level 08/03/2016  . Chronic pain syndrome 07/09/2016  . Encounter for medication monitoring 02/24/2016  . Cervical spondylosis 12/17/2015  . Cervical facet arthropathy 12/17/2015  . Chronic neck pain (Bilateral) (R>L) 12/17/2015  . Cervical facet syndrome (Bilateral) (R>L) 12/17/2015  . Chronic upper extremity pain (Bilateral) (R>L) 12/17/2015  . Cervical foraminal stenosis 12/17/2015  .  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 Arthropathy of shoulder (Right) 12/17/2015  . Osteoarthritis 12/17/2015  . 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 Lumbar foraminal stenosis (L4-5) (Left) 12/17/2015  . Grade 1 Anterolisthesis (2 mm) of L4 over L5. 12/17/2015  . Lumbar facet (L4-5) synovial cyst (6 mm) (Left) 12/17/2015  . Chronic sacroiliac joint pain (Bilateral) (L>R) 12/08/2015  . Neurogenic pain 12/08/2015  . Abnormal MRI, lumbar spine 11/21/2015  . Radicular pain of shoulder (Right) 10/03/2015  . Carpal tunnel syndrome (Bilateral) 10/03/2015  . Chronic cervical radicular pain (Right) 10/03/2015  . Chronic low back pain (Primary Area of Pain) (Bilateral) (L>R) 09/06/2015  . Opiate use (60 MME/Day) 09/05/2015  . Long term prescription opiate use 09/05/2015  . Long term current use of opiate analgesic 09/05/2015  . Spondylosis of lumbar spine 09/05/2015  . Lumbar facet joint syndrome ([place]) 09/05/2015  . Chronic hip pain (Left) 09/05/2015  . Diabetic peripheral neuropathy ([place]) 09/05/2015  . Chronic lumbar radicular pain (S1 Dermatome) (Bilateral) (L>R) 09/05/2015  . Hypokalemia 09/05/2015  . Chronic shoulder pain (Secondary area of Pain) (Bilateral) (L>R) 09/05/2015  . Diabetes ([place]) 09/05/2015  . Chronic foot pain (  Third area of Pain) (Bilateral) (L>R) 09/05/2015  . Chronic hand pain (Fourth area of Pain) (Bilateral) (R>L) 09/05/2015  . Lumbar facet hypertrophy 09/05/2015  . Encounter for therapeutic drug level  monitoring 09/05/2015  . Low testosterone 09/05/2015  . Occipital pain (Right) 09/05/2015  . 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 Hypertriglyceridemia 07/26/2015  . Chronic abdominal pain (RUQ) 07/14/2015  . Type 2 diabetes mellitus with peripheral neuropathy ([place]) 07/14/2015  . Chest pain 07/14/2015  . Complete tear of the shoulder rotator cuff (Left) 05/10/2015  . Complete tear of left rotator cuff 05/10/2015  . SOB (shortness of breath) 03/22/2015  . Elevated hemoglobin ([place]) 07/07/2013  . History of lumbar facet Synovial cyst, surgically removed. 05/29/2013    Class: History of  . 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 GERD (gastroesophageal reflux disease) 05/05/2013  . Benign prostatic hyperplasia 05/05/2013  . Hypertension 05/08/2011  . Hyperlipidemia 05/08/2011  . RA (rheumatoid arthritis) ([place]) 05/08/2011    Past Surgical History:  Procedure Laterality Date  . APPENDECTOMY    . BACK SURGERY  2015   x3  . BACK SURGERY  08/09/2016  . BACK SURGERY Bilateral 2019  . CHOLECYSTECTOMY  2000  . COLONOSCOPY WITH PROPOFOL N/A 04/11/2015   Procedure: COLONOSCOPY WITH PROPOFOL;  Surgeon: Manya Silvas, MD;  [facility] END[facility]COPY;  Service: Endoscopy;  Laterality: N/A;  . CYSTOSCOPY WITH URETEROSCOPY AND STENT PLACEMENT Right 08/27/2017   Procedure: CYSTOSCOPY WITH URETEROSCOPY AND STENT PLACEMENT;  Surgeon: Abbie Sons, MD;  Location: [facility] ORS;  Service: Urology;  Laterality: Right;  . ESOPHAGOGASTRODUODENOSCOPY (EGD) WITH PROPOFOL N/A 04/11/2015   Procedure: ESOPHAGOGASTRODUODENOSCOPY (EGD) WITH PROPOFOL;  Surgeon: Manya Silvas, MD;  Location: [facility] ENDOSCOPY;  Service: Endoscopy;  Laterality: N/A;  . LAPAROSCOPIC APPENDECTOMY N/A 12/18/2017   Procedure: APPENDECTOMY LAPAROSCOPIC;  Surgeon: Florene Glen, MD;  Location: [facility] ORS;  Service: General;  Laterality: N/A;  . SHOULDER SURGERY Right 2016  . SHOULDER SURGERY Left 06/2015    Prior to  Admission medications   Medication Sig Start Date End Date Taking? Authorizing Provider  albuterol (PROVENTIL HFA;VENTOLIN HFA) 108 (90 Base) MCG/ACT inhaler Inhale 2 puffs into the lungs every 6 (six) hours as needed for wheezing or shortness of breath. 05/29/17   Fisher, Linden Dolin, PA-C  atorvastatin (LIPITOR) 40 MG tablet Take 1 tablet (40 mg total) by mouth at bedtime. 03/08/17   Arnetha Courser, MD  celecoxib (CELEBREX) 100 MG capsule Take 1 capsule (100 mg total) by mouth 2 (two) times daily. 04/06/18   Marin Olp, PA-C  chlorthalidone (HYGROTON) 50 MG tablet Take 50 mg by mouth daily.    [provider]  gabapentin (NEURONTIN) 800 MG tablet Take 1 tablet (800 mg total) by mouth every 8 (eight) hours. 01/28/19 04/28/19  Milinda Pointer, MD  lisinopril (ZESTRIL) 20 MG tablet Take 20 mg by mouth daily. 09/18/18   [provider]  meclizine (ANTIVERT) 25 MG tablet Take 25 mg by mouth every 6 (six) hours as needed for dizziness.    [provider]  ondansetron (ZOFRAN ODT) 4 MG disintegrating tablet Take 1 tablet (4 mg total) by mouth every 8 (eight) hours as needed for nausea or vomiting. 12/29/17   Darel Hong, MD  oxyCODONE (OXY IR/ROXICODONE) 5 MG immediate release tablet Take 1 tablet (5 mg total) by mouth every 6 (six) hours as needed for severe pain. Must last 30 days. 02/04/19 03/06/19  Milinda Pointer, MD  oxyCODONE (OXY IR/ROXICODONE) 5  MG immediate release tablet Take 1 tablet (5 mg total) by mouth every 6 (six) hours as needed for severe pain. Must last 30 days. 03/06/19 04/05/19  Milinda Pointer, MD  oxyCODONE (OXY IR/ROXICODONE) 5 MG immediate release tablet Take 1 tablet (5 mg total) by mouth every 6 (six) hours as needed for severe pain. Must last 30 days. 04/05/19 05/05/19  Milinda Pointer, MD  potassium chloride (K-DUR) 10 MEQ tablet Take 1 tablet (10 mEq total) by mouth daily. Patient taking differently: Take 10 mEq by mouth daily as needed (For  low potassium when instructred by his doctor.).  08/23/17   Stoioff, Ronda Fairly, MD  silodosin (RAPAFLO) 8 MG CAPS capsule Take 1 capsule (8 mg total) by mouth daily with breakfast. 11/04/18   Billey Co, MD  sitaGLIPtin (JANUVIA) 50 MG tablet Take 1 tablet (50 mg total) by mouth daily. 03/08/17   Arnetha Courser, MD  tamsulosin (FLOMAX) 0.4 MG CAPS capsule Take 1 capsule (0.4 mg total) by mouth daily. 11/04/18   Billey Co, MD  testosterone cypionate (DEPOTESTOSTERONE CYPIONATE) 200 MG/ML injection Inject 200 mg into the muscle every 14 (fourteen) days. 10/07/18   [provider]    Allergies Adhesive [tape]  Family History  Problem Relation Age of Onset  . Cancer Mother        Breast CA  . Stroke Father   . Heart disease Father   . Cancer Sister   . Cancer Maternal Grandmother   . Cancer Sister   . Bladder Cancer Neg Hx   . Prostate cancer Neg Hx   . Kidney cancer Neg Hx     Social History Social History   Tobacco Use  . Smoking status: Never Smoker  . Smokeless tobacco: Former Systems developer    Types: Chew  Substance Use Topics  . Alcohol use: No    Frequency: Never  . Drug use: No      Review of Systems Constitutional: No fever/chills Eyes: No visual changes. ENT: No sore throat. Cardiovascular: Positive chest pain Respiratory: Positive for shortness of breath Gastrointestinal: No abdominal pain.  No nausea, no vomiting.  No diarrhea.  No constipation. Genitourinary: Negative for dysuria. Musculoskeletal: Negative for back pain. Skin: Negative for rash. Neurological: Negative for headaches, focal weakness or numbness. All other ROS negative ____________________________________________   PHYSICAL EXAM:  VITAL SIGNS: ED Triage Vitals  Enc Vitals Group     BP 02/15/19 0932 (!) 142/85     Pulse Rate 02/15/19 0932 65     Resp 02/15/19 0932 16     Temp 02/15/19 0932 98.2 F (36.8 C)     Temp Source 02/15/19 0932 Oral     SpO2 02/15/19 0932 97 %      Weight 02/15/19 0929 195 lb (88.5 kg)     Height 02/15/19 0929 5\' 7"  (1.702 m)     Head Circumference --      Peak Flow --      Pain Score 02/15/19 0929 10     Pain Loc --      Pain Edu? --      Excl. in [place]? --     Constitutional: Alert and oriented. Well appearing and in no acute distress. Eyes: Conjunctivae are normal. EOMI. Head: Atraumatic. Nose: No congestion/rhinnorhea. Mouth/Throat: Mucous membranes are moist.   Neck: No stridor. Trachea Midline. FROM Cardiovascular: Normal rate, regular rhythm. Grossly normal heart sounds.  Good peripheral circulation. Respiratory: Normal respiratory effort.  No retractions. Lungs CTAB. Gastrointestinal:  Soft and nontender. No distention. No abdominal bruits.  Musculoskeletal: No lower extremity tenderness nor edema.  No joint effusions.  No leg swelling Neurologic:  Normal speech and language. No gross focal neurologic deficits are appreciated.  Skin:  Skin is warm, dry and intact. No rash noted. Psychiatric: Mood and affect are normal. Speech and behavior are normal. GU: Deferred   ____________________________________________   LABS (all labs ordered are listed, but only abnormal results are displayed)  Labs Reviewed  BASIC METABOLIC PANEL - Abnormal; Notable for the following components:      Result Value   Glucose, Bld 194 (*)    All other components within normal limits  CBC  HEPATIC FUNCTION PANEL  TROPONIN I (HIGH SENSITIVITY)  TROPONIN I (HIGH SENSITIVITY)   ____________________________________________   ED ECG REPORT I, Vanessa Archer, the attending physician, personally viewed and interpreted this ECG.  EKG is sinus rate of 67, no ST elevation, no T wave inversion, normal intervals. ____________________________________________  RADIOLOGY Robert Bellow, personally viewed and evaluated these images (plain radiographs) as part of my medical decision making, as well as reviewing the written report by the radiologist.   ED MD interpretation: No pneumonia  Official radiology report(s): Dg Chest 2 View  Result Date: 02/15/2019 CLINICAL DATA:  Chest pain with shortness of breath EXAM: CHEST - 2 VIEW COMPARISON:  05/29/2017 FINDINGS: Normal heart size and mediastinal contours. Chronic elevation of the right diaphragm. There is no edema, consolidation, effusion, or pneumothorax. Cholecystectomy clips. Lower thoracic spondylosis. IMPRESSION: No emergent finding or change from 2018. Electronically Signed   By: Monte Fantasia M.D.   On: 02/15/2019 10:17    ____________________________________________   PROCEDURES  Procedure(s) performed (including Critical Care):  Procedures   ____________________________________________   INITIAL IMPRESSION / ASSESSMENT AND PLAN / ED COURSE   [name] was evaluated in Emergency Department on 02/15/2019 for the symptoms described in the history of present illness. He was evaluated in the context of the global COVID-19 pandemic, which necessitated consideration that the patient might be at risk for infection with the SARS-CoV-2 virus that causes COVID-19. Institutional protocols and algorithms that pertain to the evaluation of patients at risk for COVID-19 are in a state of rapid change based on information released by regulatory bodies including the CDC and federal and state organizations. These policies and algorithms were followed during the patient's care in the ED.    Given patient's risk factors will get cardiac markers to evaluate for ACS.  Perhaps some anxiety contributing although the chest pain is new.  We will give a small dose of Ativan to see if that helps symptoms.  DDx that was also considered d/t potential to cause harm, but was found less likely based on history and physical (as detailed above): -PNA (no fevers, cough but CXR to evaluate) -PNX (reassured with equal b/l breath sounds, CXR to evaluate) -Symptomatic anemia (will get H&H) -Pulmonary  embolism as no sob at rest, not pleuritic in nature, no hypoxia, no other risk factors. -Aortic Dissection as no tearing pain and no radiation to the mid back, pulses equal -Pericarditis no rub on exam, EKG changes or hx to suggest dx -Tamponade (no notable SOB, tachycardic, hypotensive) -Esophageal rupture (no h/o diffuse vomitting/no crepitus)  Labs are reassuring with normal white count no evidence of infection.  No evidence of anemia.  First troponin was 5.  Chest x-ray was unchanged from prior.  Repeat cardiac cardiac markers are negative.  Pt feels  better after the anxiety meds.  Pt had some of his chronic back pain that I had given him oxycodone for.    1:06 PM reevaluated patient is feeling better.  Discussed with patient that he should follow-up with cardiology due to his risk factors for heart disease although I suspect that some of this can be attributed to anxiety.  Will give patient a trial hydroxyzine and patient will follow-up with his PCP to discuss potentially starting an SSRI.  I discussed the provisional nature of ED diagnosis, the treatment so far, the ongoing plan of care, follow up appointments and return precautions with the patient and any family or support people present. They expressed understanding and agreed with the plan, discharged home.    ____________________________________________   FINAL CLINICAL IMPRESSION(S) / ED DIAGNOSES   Final diagnoses:  Chest pain, unspecified type  Anxiety     MEDICATIONS GIVEN DURING THIS VISIT:  Medications  LORazepam (ATIVAN) injection 0.5 mg (0.5 mg Intravenous Given 02/15/19 1014)  oxyCODONE-acetaminophen (PERCOCET/ROXICET) 5-325 MG per tablet 1 tablet (1 tablet Oral Given 02/15/19 1144)  ketorolac (TORADOL) 30 MG/ML injection 30 mg (30 mg Intravenous Given 02/15/19 1145)     ED Discharge Orders         Ordered    hydrOXYzine (ATARAX/VISTARIL) 25 MG tablet  3 times daily PRN     02/15/19 1313           Note:   This document was prepared using Dragon voice recognition software and may include unintentional dictation errors.   Vanessa Cibola, MD 02/15/19 [phone]

## 2019-02-15 NOTE — Discharge Instructions (Addendum)
Your work-up was reassuring.  You no evidence of a heart attack today.  However you should follow-up with cardiology given your risk factors to discuss if you need a repeat stress test.  There could be a component of anxiety with this as well so started you had some hydroxyzine to take as needed for anxiety.  You should follow-up with your primary care doctor.  They may want to start you on an SSRI to help with your anxiety.  Return the ER if you develop worsening shortness of breath, worsening chest pain or any other concerns

## 2019-02-15 NOTE — ED Notes (Signed)
E-signature not working at this time. Pt verbalized understanding of D/C instructions and prescriptions with no further questions at this time. Pt in NAD at time of D/C. Pt ambulatory to lobby at this time.

## 2019-02-15 NOTE — ED Notes (Signed)
Pt states he has been having spells of SHOB/ anxiety have been happening for a while now, especially when he is in a small area. Episodes usually last for a couple of days. Chest pain is new, started a couple of days ago and is staying constant. Pt states CP feels like someone is sitting down on his chest, non-radiating. No hx of heart attack.

## 2019-02-15 NOTE — ED Triage Notes (Signed)
Pt arrived via POV with c/o chest pain and shortness of breath for several days. Pt states the pain is in the center of his chest and is non-radiating.

## 2019-02-19 ENCOUNTER — Encounter: Payer: Self-pay | Admitting: Pain Medicine

## 2019-02-20 DIAGNOSIS — E1165 Type 2 diabetes mellitus with hyperglycemia: Secondary | ICD-10-CM | POA: Diagnosis not present

## 2019-02-20 DIAGNOSIS — M1990 Unspecified osteoarthritis, unspecified site: Secondary | ICD-10-CM | POA: Diagnosis not present

## 2019-02-20 DIAGNOSIS — G894 Chronic pain syndrome: Secondary | ICD-10-CM | POA: Diagnosis not present

## 2019-02-20 DIAGNOSIS — I10 Essential (primary) hypertension: Secondary | ICD-10-CM | POA: Diagnosis not present

## 2019-02-22 NOTE — Progress Notes (Signed)
Pain Management Virtual Encounter Note - Virtual Visit via Telephone Telehealth (real-time audio visits between healthcare provider and patient).   Patient's Phone No. & Preferred Pharmacy:  [phone] (home); [phone] (mobile); (Preferred) [phone] No e-mail address on record  [facility] [address] [place] [address] Phone: [phone] Fax: [phone]    Pre-screening note:  Our staff contacted Mr. Stiltz and offered him an "in person", "face-to-face" appointment versus a telephone encounter. He indicated preferring the telephone encounter, at this time.   Reason for Virtual Visit: COVID-19*  Social distancing based on CDC and AMA recommendations.   I contacted [name] on 02/23/2019 via telephone.      I clearly identified myself as Gaspar Cola, MD. I verified that I was speaking with the correct person using two identifiers (Name: Howard Murphy, and date of birth: 1956-03-20).  Advanced Informed Consent I sought verbal advanced consent from [name] for virtual visit interactions. I informed Mr. Chandler of possible security and privacy concerns, risks, and limitations associated with providing "not-in-person" medical evaluation and management services. I also informed Mr. Strange of the availability of "in-person" appointments. Finally, I informed him that there would be a charge for the virtual visit and that he could be  personally, fully or partially, financially responsible for it. Howard Murphy expressed understanding and agreed to proceed.   Historic Elements   Howard Murphy is a 63 y.o. year old, male patient evaluated today after his last encounter by our practice on 02/04/2019. Howard Murphy  has a past medical history of Arthritis, Back ache (05/05/2013), Diabetes mellitus without complication ([place]), GERD (gastroesophageal reflux disease), Headache,  History of kidney stones, Hyperlipemia, Hypertension, Kidney stones, Kidney stones, Nephrolithiasis, and Shortness of breath dyspnea. He also  has a past surgical history that includes Cholecystectomy (2000); Shoulder surgery (Right, 2016); Colonoscopy with propofol (N/A, 04/11/2015); Esophagogastroduodenoscopy (egd) with propofol (N/A, 04/11/2015); Shoulder surgery (Left, 06/2015); Back surgery (2015); Back surgery (08/09/2016); Cystoscopy with ureteroscopy and stent placement (Right, 08/27/2017); laparoscopic appendectomy (N/A, 12/18/2017); Appendectomy; and Back surgery (Bilateral, 2019). Howard Murphy has a current medication list which includes the following prescription(s): albuterol, atorvastatin, chlorthalidone, hydroxyzine, lisinopril, meclizine, ondansetron, potassium chloride, sitagliptin, tamsulosin, testosterone cypionate, cyclobenzaprine, melatonin, meloxicam, oxycodone hcl, pregabalin, pregabalin, pregabalin, pregabalin, and tizanidine. He  reports that he has never smoked. He quit smokeless tobacco use about 6 years ago.  His smokeless tobacco use included chew. He reports that he does not drink alcohol or use drugs. Howard Murphy is allergic to adhesive [tape].   HPI  Today, he is being contacted for medication management. Has questions about change from oxycodone/APAP 5/325 to oxycodone IR 5. Patient informed that the difference is the tylenol (APAP= abreviation for acetaminophen). Patient informed of the hepatic safety reason involving chronic use of acetaminophen.   Today I spent over 35 minutes talking to this patient about his medications and how to adjust him.  The patient at the beginning was very frustrated and even though I was agreeing with him he simply was not listening to anything that I was saying.  Once he calmed down he was able to understand that I was completely in line with what he was trying to say and I was clearly understanding that his current regimen as it was, was not helping  him.  However the problem was that he was not understanding  that in order to control his pain, because of the nature of its mixed components, he was never going to get adequate relief by taking only 1 type of medication.  Once he understood that I was trying to help him by adding a muscle relaxant, a nonsteroidal anti-inflammatory drug, a membrane stabilizer, and that we had eliminated the APAP from his "Percocet", I was then going to be able to increase the oxycodone component without running into problems with the acetaminophen.  Once he was able to grasp this concept, he was then able to come down to the point where he was not only hearing what I was saying but he was actually listening.  At this point, I will be taking over some of his medications and I will delete the Celebrex, and I will be starting him on meloxicam.  In addition I will be adding to muscle relaxants, Flexeril 10 mg at bedtime to help him sleep and tie tizanidine 4 mg 3 times daily to help with the muscle component of his pain during the day.  In addition to this, I will be slowly increasing his oxycodone from the 20 mg/day to 30 mg/day and later on, if need be, I may increase it further to 40 mg/day.  In addition to this, I will be starting him on a Lyrica trial which I will titrate up as he tolerates. One of the problems that triggered this flareup was the fact that he was very frustrated with his chronic pain and this led him to simply stop taking the gabapentin 800 mg 4 times daily, without titrating it down, which of course resulted in a flareup of his pain.  Because it took a little while for the gabapentin to get out of his system, he was not able to connect the fact that he stopped it abruptly with the increase in the pain that took a couple days to settle in.  Unfortunately, in the past he had taken "some" Lyrica and because it did not give him any significant benefit right away he decided that it was not helping and he stopped  taking it.  This meant that he did not really go through a decent trial on the medicine.  Today I have taken the time to explain to him that the medicine needs to be taken regularly and that it will take 2 to 3 weeks for levels to build up in his system to the point where he will be able to see some benefit.  Unfortunately, he is not a very patient man and therefore I am not sure his he is going to be very compliant.  This remains to be seen.  I will be contacting him again in 30 days to evaluate how he is doing and hopefully he will be doing better and compliant with my instructions.  In any case, I have informed him that if by any chance any of the medications gives him any side effects or adverse reactions, then he should stop them.  However, if it is simply that he feels that they are not effective at their current dose, he was told not to stop but until I could talk to him and determine if we had reached maximum doses on the medicine.  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  Pharmacotherapy Assessment  Analgesic: Oxycodone IR 5 mg,  1 tab PO q 6 hrs (20 mg/dayof oxycodone) today I will be changing this to oxycodone IR 10 mg 1 tab PO q 8 hrs (30 mg/day of oxycodone) MME/day:30mg /day.  Today this will be increased to 45 mg/day.   Monitoring: Pharmacotherapy: No side-effects or adverse reactions reported.  PMP: PDMP reviewed during this encounter.       Compliance: No problems identified. Effectiveness: Clinically acceptable. Plan: Refer to "POC".  UDS:  Summary  Date Value Ref Range Status  08/06/2018 FINAL  Final    Comment:    ==================================================================== TOXASSURE SELECT 13 (MW) ==================================================================== Test                              Result       Flag       Units Drug Present and Declared for Prescription Verification   Oxycodone                      1457         EXPECTED   ng/mg creat   Oxymorphone                    918          EXPECTED   ng/mg creat   Noroxycodone                   2805         EXPECTED   ng/mg creat   Noroxymorphone                 480          EXPECTED   ng/mg creat    Sources of oxycodone are scheduled prescription medications.    Oxymorphone, noroxycodone, and noroxymorphone are expected    metabolites of oxycodone. Oxymorphone is also available as a    scheduled prescription medication. ==================================================================== Test                      Result    Flag   Units      Ref Range   Creatinine              202              mg/dL      >=20 ==================================================================== Declared Medications:  The flagging and interpretation on this report are based on the  following declared medications.  Unexpected results may arise from  inaccuracies in the declared medications.  **Note: The testing scope of this panel includes these medications:  Oxycodone (Percocet)  **Note: The testing scope of this panel does not include following  reported medications:  Acetaminophen  Acetaminophen (Percocet)  Albuterol  Atorvastatin  Celecoxib  Chlorthalidone  Gabapentin (Neurontin)  Meclizine (Antivert)  Methocarbamol (Robaxin)  Ondansetron (Zofran)  Potassium  Ranitidine  Sitagliptin ==================================================================== For clinical consultation, please call [phone]. ====================================================================    Laboratory Chemistry Profile (12 mo)  Renal: 02/15/2019: BUN 16; Creatinine, Ser 0.95  Lab Results  Component Value Date   GFRAA >60 02/15/2019   GFRNONAA >60 02/15/2019   Hepatic: 02/15/2019: Albumin 4.1 Lab Results  Component Value Date   AST 36  02/15/2019   ALT 32 02/15/2019   Other: No results found for requested labs within last 8760 hours. Note: Above Lab results reviewed.  Imaging  Last 90 days:  Dg Chest 2 View  Result Date: 02/15/2019 CLINICAL  DATA:  Chest pain with shortness of breath EXAM: CHEST - 2 VIEW COMPARISON:  05/29/2017 FINDINGS: Normal heart size and mediastinal contours. Chronic elevation of the right diaphragm. There is no edema, consolidation, effusion, or pneumothorax. Cholecystectomy clips. Lower thoracic spondylosis. IMPRESSION: No emergent finding or change from 2018. Electronically Signed   By: Monte Fantasia M.D.   On: 02/15/2019 10:17   Ct Renal Stone Study  Result Date: 12/03/2018 CLINICAL DATA:  Flank pain EXAM: CT ABDOMEN AND PELVIS WITHOUT CONTRAST TECHNIQUE: Multidetector CT imaging of the abdomen and pelvis was performed following the standard protocol without IV contrast. COMPARISON:  CT 12/29/2017, 12/19/1998 FINDINGS: Lower chest: Lung bases demonstrate no acute consolidation or effusion. The heart size is within normal limits. Coronary vascular calcification. Hepatobiliary: No focal liver abnormality is seen. Status post cholecystectomy. No biliary dilatation. Pancreas: Unremarkable. No pancreatic ductal dilatation or surrounding inflammatory changes. Spleen: Normal in size without focal abnormality. Adrenals/Urinary Tract: Adrenal glands are within normal limits. No hydronephrosis. No ureteral stone. The bladder is normal. Stomach/Bowel: Stomach is within normal limits. Status post appendectomy. No evidence of bowel wall thickening, distention, or inflammatory changes. Vascular/Lymphatic: Mild aortic atherosclerosis. No aneurysm. No significantly enlarged lymph nodes. Reproductive: Prostate calcifications. Other: Negative for free air or free fluid. Within the bilateral inguinal canals. Small fat containing umbilical hernia. Musculoskeletal: Postsurgical changes of the lumbar spine from L2 through the mid  sacrum with spinal rods, fixating screws and interbody devices L2-L3, L3-L4 and L4-L5. IMPRESSION: 1. No CT evidence for acute intra-abdominal or pelvic abnormality. Negative for hydronephrosis or ureteral stone. 2. Interval operative changes of lumbar spine with hardware not extending from L2 through the mid sacrum. Electronically Signed   By: Donavan Foil M.D.   On: 12/03/2018 22:31    Assessment  The primary encounter diagnosis was Chronic pain syndrome. 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  Plan of Care  I have discontinued Shanon Brow C. Holton's celecoxib, silodosin, oxyCODONE, oxyCODONE, oxyCODONE, and gabapentin. I am also having him start on Oxycodone HCl, meloxicam, cyclobenzaprine, tiZANidine, pregabalin, pregabalin, pregabalin, pregabalin, and Melatonin. Additionally, I am having him maintain his sitaGLIPtin, atorvastatin, albuterol, chlorthalidone, potassium chloride, ondansetron, meclizine, tamsulosin, lisinopril, testosterone cypionate, and hydrOXYzine.  Pharmacotherapy (Medications Ordered): Meds ordered this encounter  Medications  . Oxycodone HCl 10 MG TABS    Sig: Take 1 tablet (10 mg total) by mouth every 8 (eight) hours. Must last 30 days.    Dispense:  90 tablet    Refill:  0    Chronic Pain. (STOP Act - Not applicable). Fill one day early if closed on scheduled refill date. Do not fill until: 03/02/2019. To last until: 04/01/2019.   . meloxicam (MOBIC) 15 MG tablet    Sig: Take 1 tablet (15 mg total) by mouth daily.    Dispense:  30 tablet    Refill:  0    Do not add this medication to the electronic "Automatic Refill" notification system. Patient may have prescription filled one day early if pharmacy is closed on scheduled refill date.  . cyclobenzaprine (FLEXERIL) 10 MG tablet  Sig: Take 1 tablet (10 mg total) by mouth at bedtime.    Dispense:  30 tablet    Refill:  0    Fill one day early if pharmacy is closed on scheduled refill date. May substitute for generic if available.  Marland Kitchen tiZANidine (ZANAFLEX) 4 MG tablet    Sig: Take 1 tablet (4 mg total) by mouth every 8 (eight) hours as needed for muscle spasms.    Dispense:  90 tablet    Refill:  0    Fill one day early if pharmacy is closed on scheduled refill date. May substitute for generic if available.  . pregabalin (LYRICA) 25 MG capsule    Sig: Take 1 capsule (25 mg total) by mouth 3 (three) times daily for 14 days.    Dispense:  42 capsule    Refill:  0    Fill one day early if pharmacy is closed on scheduled refill date. May substitute for generic if available.  . pregabalin (LYRICA) 50 MG capsule    Sig: Take 1 capsule (50 mg total) by mouth 3 (three) times daily for 14 days.    Dispense:  42 capsule    Refill:  0    Fill one day early if pharmacy is closed on scheduled refill date. May substitute for generic if available.  . pregabalin (LYRICA) 75 MG capsule    Sig: Take 1 capsule (75 mg total) by mouth 3 (three) times daily for 14 days.    Dispense:  42 capsule    Refill:  0    Fill one day early if pharmacy is closed on scheduled refill date. May substitute for generic if available.  . pregabalin (LYRICA) 100 MG capsule    Sig: Take 1 capsule (100 mg total) by mouth 3 (three) times daily for 14 days.    Dispense:  42 capsule    Refill:  0    Fill one day early if pharmacy is closed on scheduled refill date. May substitute for generic if available.   . Melatonin 10 MG CAPS    Sig: Take 20 mg by mouth at bedtime as needed.    Dispense:  30 capsule    Refill:  0    Fill one day early if pharmacy is closed on scheduled refill date. May substitute for generic if available.   Orders:  Orders Placed This Encounter  Procedures  . Ambulatory referral to Neurosurgery    Referral Priority:   Routine    Referral Type:   Surgical    Referral Reason:   Specialty Services Required    Requested Specialty:   Neurosurgery    Number of Visits Requested:   1   Follow-up plan:   Return in about 5 weeks (around 03/30/2019) for (VV), E/M, (MM).      Interventional management options: Planned, scheduled, and/or pending:   Referral to [facility] neurosurgery surgery or neurosurgery for minimally invasive microendoscopic resection of the left L5 transverse process for the treatment of the patient's low back pain associated with Bertolotti's syndrome.   Considering:   Diagnostic bilateral cervical facet block Possible bilateral cervical facet RFA  Diagnostic right greater occipital NB Possible right GON RFA  Possible right GON peripheral nerve stimulator trial  Diagnostic right CESI  Diagnostic bilateral IA shoulder injection Diagnostic bilateral suprascapular NB Possible bilateral suprascapular nerve RFA  Diagnostic bilateral SI joint block Possible bilateral SI joint RFA  Diagnostic bilateral lumbar facet block Possible bilateral lumbar facet RFA  Diagnostic left  L4 TFESI  Diagnostic left L4-5 LESI  Diagnostic left IA hip joint injection Possible left hip joint RFA  Diagnostic bilateral median nerve block(carpal tunnel block)  Diagnostic right CPB   Palliative PRN treatment(s):   Palliative/Diagnostic left SI Block (Area of L5 Transverse process spatulation) #2  Palliative IM Toradol/Norflex 60/60 (PRN)      Recent Visits Date Type Provider Dept  01/28/19 Office Visit Milinda Pointer, [facility]  12/03/18 Office  Visit Milinda Pointer, MD [facility]  Showing recent visits within past 90 days and meeting all other requirements   Today's Visits Date Type Provider Dept  02/23/19 Office Visit Milinda Pointer, MD [facility]  Showing today's visits and meeting all other requirements   Future Appointments Date Type Provider Dept  05/04/19 Appointment Milinda Pointer, MD [facility]  Showing future appointments within next 90 days and meeting all other requirements   I discussed the assessment and treatment plan with the patient. The patient was provided an opportunity to ask questions and all were answered. The patient agreed with the plan and demonstrated an understanding of the instructions.  Patient advised to call back or seek an in-person evaluation if the symptoms or condition worsens.  Total duration of non-face-to-face encounter: 35 minutes.  Note by: Gaspar Cola, MD Date: 02/23/2019; Time: 7:04 PM  Note: This dictation was prepared with Dragon dictation. Any transcriptional errors that may result from this process are unintentional.  Disclaimer:  * Given the special circumstances of the COVID-19 pandemic, the federal government has announced that the Office for Civil Rights (OCR) will exercise its enforcement discretion and will not impose penalties on physicians using telehealth in the event of noncompliance with regulatory requirements under the [place] (HIPAA) in connection with the good faith provision of telehealth during the XX123456 national public health emergency. ([place])

## 2019-02-23 ENCOUNTER — Ambulatory Visit: Payer: Medicare Other | Attending: Pain Medicine | Admitting: Pain Medicine

## 2019-02-23 ENCOUNTER — Other Ambulatory Visit: Payer: Self-pay | Admitting: Pain Medicine

## 2019-02-23 ENCOUNTER — Other Ambulatory Visit: Payer: Self-pay

## 2019-02-23 DIAGNOSIS — G4701 Insomnia due to medical condition: Secondary | ICD-10-CM | POA: Insufficient documentation

## 2019-02-23 DIAGNOSIS — M19011 Primary osteoarthritis, right shoulder: Secondary | ICD-10-CM | POA: Diagnosis not present

## 2019-02-23 DIAGNOSIS — M545 Low back pain, unspecified: Secondary | ICD-10-CM

## 2019-02-23 DIAGNOSIS — M7918 Myalgia, other site: Secondary | ICD-10-CM | POA: Diagnosis not present

## 2019-02-23 DIAGNOSIS — M19012 Primary osteoarthritis, left shoulder: Secondary | ICD-10-CM

## 2019-02-23 DIAGNOSIS — M159 Polyosteoarthritis, unspecified: Secondary | ICD-10-CM

## 2019-02-23 DIAGNOSIS — Q7649 Other congenital malformations of spine, not associated with scoliosis: Secondary | ICD-10-CM

## 2019-02-23 DIAGNOSIS — E1142 Type 2 diabetes mellitus with diabetic polyneuropathy: Secondary | ICD-10-CM | POA: Diagnosis not present

## 2019-02-23 DIAGNOSIS — G5793 Unspecified mononeuropathy of bilateral lower limbs: Secondary | ICD-10-CM

## 2019-02-23 DIAGNOSIS — G8929 Other chronic pain: Secondary | ICD-10-CM

## 2019-02-23 DIAGNOSIS — M47812 Spondylosis without myelopathy or radiculopathy, cervical region: Secondary | ICD-10-CM | POA: Diagnosis not present

## 2019-02-23 DIAGNOSIS — M79641 Pain in right hand: Secondary | ICD-10-CM | POA: Diagnosis not present

## 2019-02-23 DIAGNOSIS — M792 Neuralgia and neuritis, unspecified: Secondary | ICD-10-CM

## 2019-02-23 DIAGNOSIS — M25512 Pain in left shoulder: Secondary | ICD-10-CM

## 2019-02-23 DIAGNOSIS — M79642 Pain in left hand: Secondary | ICD-10-CM

## 2019-02-23 DIAGNOSIS — M48062 Spinal stenosis, lumbar region with neurogenic claudication: Secondary | ICD-10-CM

## 2019-02-23 DIAGNOSIS — M25511 Pain in right shoulder: Secondary | ICD-10-CM

## 2019-02-23 DIAGNOSIS — G894 Chronic pain syndrome: Secondary | ICD-10-CM | POA: Diagnosis not present

## 2019-02-23 DIAGNOSIS — M150 Primary generalized (osteo)arthritis: Secondary | ICD-10-CM | POA: Diagnosis not present

## 2019-02-23 DIAGNOSIS — G9519 Other vascular myelopathies: Secondary | ICD-10-CM

## 2019-02-23 MED ORDER — CYCLOBENZAPRINE HCL 10 MG PO TABS: 10.0000 mg | ORAL_TABLET | Freq: Every day | ORAL | 0 refills | Status: DC

## 2019-02-23 MED ORDER — OXYCODONE HCL 10 MG PO TABS: 10.0000 mg | ORAL_TABLET | Freq: Three times a day (TID) | ORAL | 0 refills | Status: DC

## 2019-02-23 MED ORDER — PREGABALIN 25 MG PO CAPS: 25.0000 mg | ORAL_CAPSULE | Freq: Three times a day (TID) | ORAL | 0 refills | Status: DC

## 2019-02-23 MED ORDER — TIZANIDINE HCL 4 MG PO TABS: 4.0000 mg | ORAL_TABLET | Freq: Three times a day (TID) | ORAL | 0 refills | Status: DC | PRN

## 2019-02-23 MED ORDER — PREGABALIN 50 MG PO CAPS: 50.0000 mg | ORAL_CAPSULE | Freq: Three times a day (TID) | ORAL | 0 refills | Status: DC

## 2019-02-23 MED ORDER — PREGABALIN 100 MG PO CAPS: 100.0000 mg | ORAL_CAPSULE | Freq: Three times a day (TID) | ORAL | 0 refills | Status: DC

## 2019-02-23 MED ORDER — MELATONIN 10 MG PO CAPS: 20.0000 mg | ORAL_CAPSULE | Freq: Every evening | ORAL | 0 refills | Status: DC | PRN

## 2019-02-23 MED ORDER — PREGABALIN 75 MG PO CAPS: 75.0000 mg | ORAL_CAPSULE | Freq: Three times a day (TID) | ORAL | 0 refills | Status: DC

## 2019-02-23 MED ORDER — MELOXICAM 15 MG PO TABS: 15.0000 mg | ORAL_TABLET | Freq: Every day | ORAL | 0 refills | Status: AC

## 2019-02-26 DIAGNOSIS — F419 Anxiety disorder, unspecified: Secondary | ICD-10-CM | POA: Diagnosis not present

## 2019-02-26 DIAGNOSIS — E291 Testicular hypofunction: Secondary | ICD-10-CM | POA: Diagnosis not present

## 2019-02-26 DIAGNOSIS — E785 Hyperlipidemia, unspecified: Secondary | ICD-10-CM | POA: Diagnosis not present

## 2019-02-26 DIAGNOSIS — R531 Weakness: Secondary | ICD-10-CM | POA: Diagnosis not present

## 2019-02-26 DIAGNOSIS — E1165 Type 2 diabetes mellitus with hyperglycemia: Secondary | ICD-10-CM | POA: Diagnosis not present

## 2019-02-26 DIAGNOSIS — R5383 Other fatigue: Secondary | ICD-10-CM | POA: Diagnosis not present

## 2019-03-10 NOTE — Progress Notes (Deleted)
03/11/2019 11:32 AM   Howard Murphy 1955/07/20 [id number]  Referring provider: Center, [facility] [place],  [place] [address]  No chief complaint on file.   HPI: Howard Murphy is a 63 y.o. male with nephrolithiasis who presents today for a routine follow up.  He presented to the ED on 08/12/2017 with the complaint of gross hematuria and right flank pain that radiated to the right testicle.  His UA was positive for TNTC RBC's.  His WBC count was 10.0.  His creatinine was 0.84.  His potassium was found to be 2.6.  He states they gave him two potassium pills.    CT Renal stone study performed on 08/12/2017 noted adrenal glands are normal.  On the right, there is a 6 mm stone positioned within the right renal pelvis (series 2, image 37). Associated mild pelviectasis, suggesting that this be at least partially obstructive. Additional punctate nonobstructive calculus within the upper pole the right kidney. Right ureter decompressed distally with no other calculi Identified.  Faint punctate nonobstructive calculi present within the left kidney. No hydronephrosis. No radiopaque calculi seen along the course of the left renal collecting system. No left-sided hydroureter.  Partially distended bladder within normal limits. No layering stones within the bladder lumen.  On 08/15/2017, he was having right flank pain.  He stated that it began one month ago.  8/10 pain.  Nothing helped the pain.  Nothing made the pain worse.  He was having associated frequency, urgency, intermittency, straining to urinate, gross hematuria and a weak urinary stream.  Patient denied any dysuria.  Patient was having nausea, but he denied any fevers, chills or vomiting.  His UA was positive for 3-10 RBC's.    He does have a prior history of stones.  Stone composition is unknown.    In 03/19, he underwent a right ureteroscopy.  His renal pelvic calculus had migrated to the lower pole.  He  underwent basketing of both calculi; the stones were crystalline and broke up easily with the basket.  He had a ureteral stent placed, which was removed on 09/02/2017.  He stated that he had persistent pain while the stent was indwelling, however he had increasing pain on the stent's removal and was seen in the ED on 09/02/2017.  A nonconstrast CT was performed which showed faint punctate material at the UPJ and UVJ, with mild right hydronephrosis and hydroureter.  This was thought to be most likely due to ureteral edema, and was managed conservatively.  When he was seen by Dr. Bernardo Heater on 09/17/2017, he was still experiencing right lower quadrant abdominal pain and occasional nausea, and was using Zofran.  He denied fevers, chills, and gross hematuria.  ***  PMH: Past Medical History:  Diagnosis Date  . 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  . Back ache 05/05/2013  . Diabetes mellitus without complication ([place])   . GERD (gastroesophageal reflux disease)   . Headache   . History of kidney stones   . Hyperlipemia   . Hypertension   . Kidney stones   . Kidney stones   . Nephrolithiasis   . Shortness of breath dyspnea     Surgical History: Past Surgical History:  Procedure Laterality Date  . APPENDECTOMY    . BACK SURGERY  2015   x3  . BACK SURGERY  08/09/2016  . BACK SURGERY Bilateral 2019  . CHOLECYSTECTOMY  2000  . COLONOSCOPY WITH PROPOFOL N/A 04/11/2015  Procedure: COLONOSCOPY WITH PROPOFOL;  Surgeon: Manya Silvas, MD;  Location: [facility] ENDOSCOPY;  Service: Endoscopy;  Laterality: N/A;  . CYSTOSCOPY WITH URETEROSCOPY AND STENT PLACEMENT Right 08/27/2017   Procedure: CYSTOSCOPY WITH URETEROSCOPY AND STENT PLACEMENT;  Surgeon: Abbie Sons, MD;  Location: [facility] ORS;  Service: Urology;  Laterality: Right;  . ESOPHAGOGASTRODUODENOSCOPY (EGD) WITH PROPOFOL N/A 04/11/2015   Procedure: ESOPHAGOGASTRODUODENOSCOPY (EGD) WITH PROPOFOL;  Surgeon: Manya Silvas, MD;  Location: [facility]  ENDOSCOPY;  Service: Endoscopy;  Laterality: N/A;  . LAPAROSCOPIC APPENDECTOMY N/A 12/18/2017   Procedure: APPENDECTOMY LAPAROSCOPIC;  Surgeon: Florene Glen, MD;  Location: [facility] ORS;  Service: General;  Laterality: N/A;  . SHOULDER SURGERY Right 2016  . SHOULDER SURGERY Left 06/2015    Home Medications:  Allergies as of 03/11/2019      Reactions   Adhesive [tape] Other (See Comments)   Honeycomb dressing, blisters      Medication List       Accurate as of March 10, 2019 11:32 AM. If you have any questions, ask your nurse or doctor.        albuterol 108 (90 Base) MCG/ACT inhaler Commonly known as: VENTOLIN HFA Inhale 2 puffs into the lungs every 6 (six) hours as needed for wheezing or shortness of breath.   atorvastatin 40 MG tablet Commonly known as: LIPITOR Take 1 tablet (40 mg total) by mouth at bedtime.   chlorthalidone 50 MG tablet Commonly known as: HYGROTON Take 50 mg by mouth daily.   cyclobenzaprine 10 MG tablet Commonly known as: FLEXERIL Take 1 tablet (10 mg total) by mouth at bedtime.   hydrOXYzine 25 MG tablet Commonly known as: ATARAX/VISTARIL Take 1 tablet (25 mg total) by mouth 3 (three) times daily as needed for anxiety.   lisinopril 20 MG tablet Commonly known as: ZESTRIL Take 20 mg by mouth daily.   meclizine 25 MG tablet Commonly known as: ANTIVERT Take 25 mg by mouth every 6 (six) hours as needed for dizziness.   Melatonin 10 MG Caps Take 20 mg by mouth at bedtime as needed.   meloxicam 15 MG tablet Commonly known as: MOBIC Take 1 tablet (15 mg total) by mouth daily.   ondansetron 4 MG disintegrating tablet Commonly known as: Zofran ODT Take 1 tablet (4 mg total) by mouth every 8 (eight) hours as needed for nausea or vomiting.   Oxycodone HCl 10 MG Tabs Take 1 tablet (10 mg total) by mouth every 8 (eight) hours. Must last 30 days.   potassium chloride 10 MEQ tablet Commonly known as: K-DUR Take 1 tablet (10 mEq total) by  mouth daily. What changed:   when to take this  reasons to take this   pregabalin 25 MG capsule Commonly known as: Lyrica Take 1 capsule (25 mg total) by mouth 3 (three) times daily for 14 days.   pregabalin 50 MG capsule Commonly known as: Lyrica Take 1 capsule (50 mg total) by mouth 3 (three) times daily for 14 days.   pregabalin 75 MG capsule Commonly known as: Lyrica Take 1 capsule (75 mg total) by mouth 3 (three) times daily for 14 days. Start taking on: March 23, 2019   pregabalin 100 MG capsule Commonly known as: Lyrica Take 1 capsule (100 mg total) by mouth 3 (three) times daily for 14 days. Start taking on: April 06, 2019   sitaGLIPtin 50 MG tablet Commonly known as: Januvia Take 1 tablet (50 mg total) by mouth daily.   tamsulosin 0.4 MG Caps  capsule Commonly known as: FLOMAX Take 1 capsule (0.4 mg total) by mouth daily.   testosterone cypionate 200 MG/ML injection Commonly known as: DEPOTESTOSTERONE CYPIONATE Inject 200 mg into the muscle every 14 (fourteen) days.   tiZANidine 4 MG tablet Commonly known as: Zanaflex Take 1 tablet (4 mg total) by mouth every 8 (eight) hours as needed for muscle spasms.       Allergies:  Allergies  Allergen Reactions  . Adhesive [Tape] Other (See Comments)    Honeycomb dressing, blisters    Family History: Family History  Problem Relation Age of Onset  . Cancer Mother        Breast CA  . Stroke Father   . Heart disease Father   . Cancer Sister   . Cancer Maternal Grandmother   . Cancer Sister   . Bladder Cancer Neg Hx   . Prostate cancer Neg Hx   . Kidney cancer Neg Hx     Social History:  reports that he has never smoked. He quit smokeless tobacco use about 6 years ago.  His smokeless tobacco use included chew. He reports that he does not drink alcohol or use drugs.  ROS:                                        Physical Exam: There were no vitals taken for this visit.   Constitutional: Well nourished. Alert and oriented, No acute distress. HEENT: [place] AT, moist mucus membranes. Trachea midline, no masses. Cardiovascular: No clubbing, cyanosis, or edema. Respiratory: Normal respiratory effort, no increased work of breathing. GI: Abdomen is soft, non tender, non distended, no abdominal masses. Liver and spleen not palpable.  No hernias appreciated.  Stool sample for occult testing is not indicated.   GU: No CVA tenderness.  No bladder fullness or masses.   Skin: No rashes, bruises or suspicious lesions. Lymph: No cervical or inguinal adenopathy. Neurologic: Grossly intact, no focal deficits, moving all 4 extremities. Psychiatric: Normal mood and affect.  Laboratory Data: Lab Results  Component Value Date   WBC 7.8 02/15/2019   HGB 15.9 02/15/2019   HCT 48.5 02/15/2019   MCV 86.0 02/15/2019   PLT 219 02/15/2019    Lab Results  Component Value Date   CREATININE 0.95 02/15/2019    No results found for: PSA  No results found for: TESTOSTERONE  Lab Results  Component Value Date   HGBA1C 6.9 (H) 03/07/2017    No results found for: TSH     Component Value Date/Time   CHOL 166 03/07/2017 1443   HDL 39 (L) 03/07/2017 1443   CHOLHDL 4.3 03/07/2017 1443   CHOLHDL 5.1 (H) 02/24/2016 1557   VLDL 48 (H) 02/24/2016 1557   LDLCALC 98 03/07/2017 1443    Lab Results  Component Value Date   AST 36 02/15/2019   Lab Results  Component Value Date   ALT 32 02/15/2019   No components found for: ALKALINEPHOPHATASE No components found for: BILIRUBINTOTAL  No results found for: ESTRADIOL  Urinalysis    Component Value Date/Time   COLORURINE YELLOW (A) 12/03/2018 2106   APPEARANCEUR CLEAR (A) 12/03/2018 2106   APPEARANCEUR Clear 08/15/2017 1347   LABSPEC 1.023 12/03/2018 2106   LABSPEC 1.015 03/23/2014 2003   PHURINE 6.0 12/03/2018 2106   GLUCOSEU 50 (A) 12/03/2018 2106   GLUCOSEU 50 mg/dL 03/23/2014 2003   [place] NEGATIVE 12/03/2018 2106  BILIRUBINUR NEGATIVE 12/03/2018 2106   BILIRUBINUR Negative 08/15/2017 [place] Negative 03/23/2014 2003   KETONESUR NEGATIVE 12/03/2018 2106   PROTEINUR NEGATIVE 12/03/2018 2106   UROBILINOGEN 1.0 02/23/2015 2347   NITRITE NEGATIVE 12/03/2018 2106   LEUKOCYTESUR NEGATIVE 12/03/2018 2106   LEUKOCYTESUR Negative 03/23/2014 2003    I have reviewed the labs.   Pertinent Imaging: CLINICAL DATA:  Initial evaluation for acute right flank pain.  EXAM: CT ABDOMEN AND PELVIS WITHOUT CONTRAST  TECHNIQUE: Multidetector CT imaging of the abdomen and pelvis was performed following the standard protocol without IV contrast.  COMPARISON:  Prior CT from 04/16/2017.  FINDINGS: Lower chest: Mild scattered atelectatic changes within the right lung base. Visualized lungs are otherwise clear.  Hepatobiliary: Hepatic steatosis. Liver otherwise unremarkable. Gallbladder surgically absent. No biliary dilatation.  Pancreas: Pancreas within normal limits.  Spleen: Spleen within normal limits.  Adrenals/Urinary Tract: Adrenal glands are normal.  On the right, there is a 6 mm stone positioned within the right renal pelvis (series 2, image 37). Associated mild pelviectasis, suggesting that this be at least partially obstructive. Additional punctate nonobstructive calculus within the upper pole the right kidney. Right ureter decompressed distally with no other calculi identified.  Faint punctate nonobstructive calculi present within the left kidney. No hydronephrosis. No radiopaque calculi seen along the course of the left renal collecting system. No left-sided hydroureter.  Partially distended bladder within normal limits. No layering stones within the bladder lumen.  Stomach/Bowel: Stomach within normal limits. No evidence for bowel obstruction. Appendix is normal. No acute inflammatory changes seen about the bowels.  Vascular/Lymphatic: Intra-abdominal aorta of  normal caliber. Mild aortic atherosclerosis. No adenopathy.  Reproductive: Prostate within normal limits.  Other: No free air or fluid. Small fat containing paraumbilical and bilateral inguinal hernias without associated inflammation.  Musculoskeletal: No acute osseous abnormality. No worrisome lytic or blastic osseous lesions. Sequelae of prior PLIF at L3-4 and L4-5.  IMPRESSION: 1. 6 mm stone lodged within the right renal pelvis with secondary mild right pelviectasis. 2. Additional punctate nonobstructive right renal nephrolithiasis. 3. No other acute intra-abdominal or pelvic process.   Electronically Signed   By: Jeannine Boga M.D.   On: 08/12/2017 21:59   I have independently reviewed the films.    Assessment & Plan:    1. Right UPJ stone  -explained to the patient that AUA Guidelines for patients with uncomplicated ureteral stones ?10 mm should be offered observation, and those with distal stones of similar size should be offered MET with ?-blockers - he will continue the tamsulosin but he is wanting to move forward with definitive management  - explained that URS would be the first lined therapy if stone(s) do not pass, but ESWL is the procedure with the least morbidity and lowest complication rate, but URS has a greater stone-free rate in a single procedure - he would like to pursue right URS  - patient will be scheduled for right URS/LL/ureteral stent placement  - explained to the patient how the procedure is performed and the risks involved  - informed patient that they will have a stent placed during the procedure and will remain in place after the procedure for a short time.   - stent may be removed in the office with a cystoscope or patient may be instructed to remove the stent themselves by the string  - described "stent pain" as feelings of needing to urinate/overactive bladder and a warm, tingling sensation to intense pain in the affected flank  -  residual stones within the kidney or ureter may be present after the procedure and may need to have these addressed at a different encounter  - injury to the ureter is the most common intra-operative risk, it may result in an open procedure to correct the defect  - infection and bleeding are also risks  - explained the risks of general anesthesia, such as: MI, CVA, paralysis, coma and/or death.  - advised to contact our office or seek treatment in the ED if becomes febrile or pain/ vomiting are difficult control in order to arrange for emergent/urgent intervention  2. Right hydronephrosis  - obtain RUS to ensure the hydronephrosis has resolved once he has recovered from URS  3. Gross hematuria  - UA today demonstrates 3-10 RBC's.    - continue to monitor the patient's UA after the treatment/passage of the stone to ensure the hematuria has resolved  - if hematuria persists, we will pursue a hematuria workup with CT Urogram and cystoscopy if appropriate.  - CULTURE, URINE COMPREHENSIVE  - Urinalysis, Complete  4. Bilateral renal stones  - punctate at this time no intervention warranted  - will offer a 24 hour metabolic study once he has recovered from URS   No follow-ups on file.  Zara Council, PA-C  [facility] [address], [place], [place] [address] [phone]

## 2019-03-11 ENCOUNTER — Encounter: Payer: Self-pay | Admitting: Urology

## 2019-03-11 ENCOUNTER — Ambulatory Visit: Payer: Medicare Other | Admitting: Urology

## 2019-03-16 DIAGNOSIS — E1165 Type 2 diabetes mellitus with hyperglycemia: Secondary | ICD-10-CM | POA: Diagnosis not present

## 2019-03-16 DIAGNOSIS — I10 Essential (primary) hypertension: Secondary | ICD-10-CM | POA: Diagnosis not present

## 2019-03-23 ENCOUNTER — Other Ambulatory Visit: Payer: Self-pay | Admitting: Pain Medicine

## 2019-03-23 DIAGNOSIS — M19012 Primary osteoarthritis, left shoulder: Secondary | ICD-10-CM

## 2019-03-23 DIAGNOSIS — M47812 Spondylosis without myelopathy or radiculopathy, cervical region: Secondary | ICD-10-CM

## 2019-03-23 DIAGNOSIS — M19011 Primary osteoarthritis, right shoulder: Secondary | ICD-10-CM

## 2019-03-23 DIAGNOSIS — M7918 Myalgia, other site: Secondary | ICD-10-CM

## 2019-03-23 DIAGNOSIS — G8929 Other chronic pain: Secondary | ICD-10-CM

## 2019-03-23 DIAGNOSIS — M545 Low back pain, unspecified: Secondary | ICD-10-CM

## 2019-03-23 DIAGNOSIS — M159 Polyosteoarthritis, unspecified: Secondary | ICD-10-CM

## 2019-03-26 ENCOUNTER — Encounter: Payer: Self-pay | Admitting: Pain Medicine

## 2019-03-29 NOTE — Progress Notes (Signed)
Pain Management Virtual Encounter Note - Virtual Visit via Telephone Telehealth (real-time audio visits between healthcare provider and patient).   Patient's Phone No. & Preferred Pharmacy:  [phone] (home); [phone] (mobile); (Preferred) [phone] No e-mail address on record  [name] [place] [address] Phone: [phone] Fax: [phone]    Pre-screening note:  Our staff contacted [name] and offered him an "in person", "face-to-face" appointment versus a telephone encounter. He indicated preferring the telephone encounter, at this time.   Reason for Virtual Visit: COVID-19*  Social distancing based on CDC and AMA recommendations.   I contacted Howard Murphy on 03/30/2019 via telephone.      I clearly identified myself as Gaspar Cola, MD. I verified that I was speaking with the correct person using two identifiers (Name: BAMIDELE VILLASENOR, and date of birth: 09-17-1955).  Advanced Informed Consent I sought verbal advanced consent from Howard Murphy for virtual visit interactions. I informed Mr. Mitchum of possible security and privacy concerns, risks, and limitations associated with providing "not-in-person" medical evaluation and management services. I also informed Mr. Offer of the availability of "in-person" appointments. Finally, I informed him that there would be a charge for the virtual visit and that he could be  personally, fully or partially, financially responsible for it. Mr. Fine expressed understanding and agreed to proceed.   Historic Elements   Howard Murphy is a 63 y.o. year old, male patient evaluated today after his last encounter by our practice on 03/23/2019. Mr. Joachim  has a past medical history of Arthritis, Back ache (05/05/2013), Diabetes mellitus without complication ([place]), GERD (gastroesophageal reflux disease), Headache,  History of kidney stones, Hyperlipemia, Hypertension, Kidney stones, Kidney stones, Nephrolithiasis, and Shortness of breath dyspnea. He also  has a past surgical history that includes Cholecystectomy (2000); Shoulder surgery (Right, 2016); Colonoscopy with propofol (N/A, 04/11/2015); Esophagogastroduodenoscopy (egd) with propofol (N/A, 04/11/2015); Shoulder surgery (Left, 06/2015); Back surgery (2015); Back surgery (08/09/2016); Cystoscopy with ureteroscopy and stent placement (Right, 08/27/2017); laparoscopic appendectomy (N/A, 12/18/2017); Appendectomy; and Back surgery (Bilateral, 2019). Mr. Murphy has a current medication list which includes the following prescription(s): albuterol, atorvastatin, canagliflozin, chlorthalidone, cholecalciferol, hydroxyzine, lisinopril, meclizine, ondansetron, potassium chloride, tamsulosin, testosterone cypionate, cyclobenzaprine, melatonin, oxycodone hcl, and tizanidine. He  reports that he has never smoked. He quit smokeless tobacco use about 6 years ago.  His smokeless tobacco use included chew. He reports that he does not drink alcohol or use drugs. Mr. Grauberger is allergic to adhesive [tape].   HPI  Today, he is being contacted for medication management.  This by the fact that on his last encounter I spent a significant amount of time talking to him about the medications, he did not follow my instructions.  He did not get any of the Lyrica prescriptions filled.  Apparently the only thing that he was concerned about was the oxycodone, which he did get filled.  He indicates that the Flexeril is giving him a dry mouth and therefore we will discontinue one.  He is taking the Zanaflex and the oxycodone.  He still complains of the oxycodone is not eliminating the pain, but then again he did not get any of the Lyrica filled.  He indicates that he has not been able to see Dr. Cari Caraway to have the transverse process of L5 removed, since he has an outstanding bill with  her  current neuro clinic.  His plan is to get this pain and be able to go back and have the surgery so asked to solve the problem with the transverse process of L5 healing sacral ala and causing the pain.  For now we will simply refill his medications and will see him back in about 90 days from now.  Pharmacotherapy Assessment  Analgesic: Oxycodone IR 10 mg 1 tab PO q 8 hrs (30 mg/day of oxycodone) MME/day:45mg /day.   Monitoring: Pharmacotherapy: No side-effects or adverse reactions reported. [place] PMP: PDMP reviewed during this encounter.       Compliance: No problems identified. Effectiveness: Clinically acceptable. Plan: Refer to "POC".  UDS:  Summary  Date Value Ref Range Status  08/06/2018 FINAL  Final    Comment:    ==================================================================== TOXASSURE SELECT 13 (MW) ==================================================================== Test                             Result       Flag       Units Drug Present and Declared for Prescription Verification   Oxycodone                      1457         EXPECTED   ng/mg creat   Oxymorphone                    918          EXPECTED   ng/mg creat   Noroxycodone                   2805         EXPECTED   ng/mg creat   Noroxymorphone                 480          EXPECTED   ng/mg creat    Sources of oxycodone are scheduled prescription medications.    Oxymorphone, noroxycodone, and noroxymorphone are expected    metabolites of oxycodone. Oxymorphone is also available as a    scheduled prescription medication. ==================================================================== Test                      Result    Flag   Units      Ref Range   Creatinine              202              mg/dL      >=20 ==================================================================== Declared Medications:  The flagging and interpretation on this report are based on the  following declared medications.  Unexpected  results may arise from  inaccuracies in the declared medications.  **Note: The testing scope of this panel includes these medications:  Oxycodone (Percocet)  **Note: The testing scope of this panel does not include following  reported medications:  Acetaminophen  Acetaminophen (Percocet)  Albuterol  Atorvastatin  Celecoxib  Chlorthalidone  Gabapentin (Neurontin)  Meclizine (Antivert)  Methocarbamol (Robaxin)  Ondansetron (Zofran)  Potassium  Ranitidine  Sitagliptin ==================================================================== For clinical consultation, please call [phone]. ====================================================================    Laboratory Chemistry Profile (12 mo)  Renal: 02/15/2019: BUN 16; Creatinine, Ser 0.95  Lab Results  Component Value Date   GFRAA >60 02/15/2019   GFRNONAA >60 02/15/2019   Hepatic: 02/15/2019: Albumin 4.1 Lab Results  Component Value Date   AST  36 02/15/2019   ALT 32 02/15/2019   Other: No results found for requested labs within last 8760 hours. Note: Above Lab results reviewed.  Imaging  Last 90 days:  Dg Chest 2 View  Result Date: 02/15/2019 CLINICAL DATA:  Chest pain with shortness of breath EXAM: CHEST - 2 VIEW COMPARISON:  05/29/2017 FINDINGS: Normal heart size and mediastinal contours. Chronic elevation of the right diaphragm. There is no edema, consolidation, effusion, or pneumothorax. Cholecystectomy clips. Lower thoracic spondylosis. IMPRESSION: No emergent finding or change from 2018. Electronically Signed   By: Monte Fantasia M.D.   On: 02/15/2019 10:17    Assessment  The primary encounter diagnosis was Chronic pain syndrome. Diagnoses of Chronic low back pain (Primary Area of Pain) (Bilateral) (L>R), Chronic foot pain (Third area of Pain) (Bilateral) (L>R), Chronic hand pain (Fourth area of Pain) (Bilateral) (R>L), Chronic musculoskeletal pain, and Insomnia secondary to chronic pain were also pertinent to  this visit.  Plan of Care  I have discontinued Shanon Brow C. Mick's sitaGLIPtin, pregabalin, pregabalin, pregabalin, and pregabalin. I am also having him maintain his atorvastatin, albuterol, chlorthalidone, potassium chloride, ondansetron, meclizine, tamsulosin, lisinopril, testosterone cypionate, hydrOXYzine, Oxycodone HCl, cyclobenzaprine, tiZANidine, Melatonin, cholecalciferol, and canagliflozin.  Pharmacotherapy (Medications Ordered): No orders of the defined types were placed in this encounter.  Orders:  No orders of the defined types were placed in this encounter.  Follow-up plan:   No follow-ups on file.      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  Still pending to see Dr. Cari Caraway (03/30/2019).   Considering:   Diagnostic bilateral cervical facet block Possible bilateral cervical facet RFA  Diagnostic right greater occipital NB Possible right GON RFA  Possible right GON peripheral nerve stimulator trial  Diagnostic right CESI  Diagnostic bilateral IA shoulder injection Diagnostic bilateral suprascapular NB Possible bilateral suprascapular nerve RFA  Diagnostic bilateral SI joint block Possible bilateral SI joint RFA  Diagnostic bilateral lumbar facet block Possible bilateral lumbar facet RFA  Diagnostic left L4 TFESI  Diagnostic left L4-5 LESI  Diagnostic left IA hip joint injection Possible left hip joint RFA  Diagnostic bilateral median nerve block(carpal tunnel block)  Diagnostic right CPB   Palliative PRN treatment(s):   Palliative/Diagnostic left SI Block (Area of L5 Transverse process spatulation) #2  Palliative IM Toradol/Norflex 60/60 (PRN)       Recent Visits Date Type Provider Dept  02/23/19  Office Visit Milinda Pointer, MD [facility]  01/28/19 Office Visit Milinda Pointer, MD [facility]  Showing recent visits within past 90 days and meeting all other requirements   Today's Visits Date Type Provider Dept  03/30/19 Office Visit Milinda Pointer, MD [facility]  Showing today's visits and meeting all other requirements   Future Appointments Date Type Provider Dept  05/04/19 Appointment Milinda Pointer, MD [facility]  Showing future appointments within next 90 days and meeting all other requirements   I discussed the assessment and treatment plan with the patient. The patient was provided an opportunity to ask questions and all were answered. The patient agreed with the plan and demonstrated an understanding of the instructions.  Patient advised to call back or seek an in-person evaluation if the symptoms or condition worsens.  Total duration of non-face-to-face encounter: 15 minutes.  Note by:  Gaspar Cola, MD Date: 03/30/2019; Time: 9:49 AM  Note: This dictation was prepared with Dragon dictation. Any transcriptional errors that may result from this process are unintentional.  Disclaimer:  * Given the special circumstances of the COVID-19 pandemic, the federal government has announced that the Office for Civil Rights (OCR) will exercise its enforcement discretion and will not impose penalties on physicians using telehealth in the event of noncompliance with regulatory requirements under the [place] (HIPAA) in connection with the good faith provision of telehealth during the XX123456 national public health emergency. ([place])

## 2019-03-30 ENCOUNTER — Other Ambulatory Visit: Payer: Self-pay

## 2019-03-30 ENCOUNTER — Ambulatory Visit: Payer: Medicare Other | Attending: Pain Medicine | Admitting: Pain Medicine

## 2019-03-30 DIAGNOSIS — M7918 Myalgia, other site: Secondary | ICD-10-CM

## 2019-03-30 DIAGNOSIS — G5793 Unspecified mononeuropathy of bilateral lower limbs: Secondary | ICD-10-CM | POA: Diagnosis not present

## 2019-03-30 DIAGNOSIS — M79641 Pain in right hand: Secondary | ICD-10-CM

## 2019-03-30 DIAGNOSIS — G8929 Other chronic pain: Secondary | ICD-10-CM | POA: Diagnosis not present

## 2019-03-30 DIAGNOSIS — G894 Chronic pain syndrome: Secondary | ICD-10-CM | POA: Diagnosis not present

## 2019-03-30 DIAGNOSIS — M545 Low back pain: Secondary | ICD-10-CM | POA: Diagnosis not present

## 2019-03-30 DIAGNOSIS — G4701 Insomnia due to medical condition: Secondary | ICD-10-CM | POA: Diagnosis not present

## 2019-03-30 DIAGNOSIS — M79642 Pain in left hand: Secondary | ICD-10-CM | POA: Diagnosis not present

## 2019-03-30 MED ORDER — OXYCODONE HCL 10 MG PO TABS: 10.0000 mg | ORAL_TABLET | Freq: Three times a day (TID) | ORAL | 0 refills | Status: DC

## 2019-03-30 MED ORDER — TIZANIDINE HCL 4 MG PO TABS: 4.0000 mg | ORAL_TABLET | Freq: Three times a day (TID) | ORAL | 5 refills | Status: DC | PRN

## 2019-04-06 ENCOUNTER — Ambulatory Visit: Payer: Medicare Other | Admitting: Pain Medicine

## 2019-04-30 ENCOUNTER — Encounter: Payer: Self-pay | Admitting: Pain Medicine

## 2019-05-03 NOTE — Progress Notes (Signed)
Pain Management Virtual Encounter Note - Virtual Visit via Telephone Telehealth (real-time audio visits between healthcare provider and patient).   Patient's Phone No. & Preferred Pharmacy:  [phone] (home); [phone] (mobile); (Preferred) [phone] No e-mail address on record  [address], [place] [address] Phone: [phone] Fax: [phone]    Pre-screening note:  Our staff contacted Howard Murphy and offered him an "in person", "face-to-face" appointment versus a telephone encounter. He indicated preferring the telephone encounter, at this time.   Reason for Virtual Visit: COVID-19*  Social distancing based on CDC and AMA recommendations.   I contacted Howard Murphy on 05/04/2019 via telephone.      I clearly identified myself as Gaspar Cola, MD. I verified that I was speaking with the correct person using two identifiers (Name: Howard Murphy, and date of birth: [birth year]).  Advanced Informed Consent I sought verbal advanced consent from Howard Murphy for virtual visit interactions. I informed Howard Murphy of possible security and privacy concerns, risks, and limitations associated with providing "not-in-person" medical evaluation and management services. I also informed Howard Murphy of the availability of "in-person" appointments. Finally, I informed him that there would be a charge for the virtual visit and that he could be  personally, fully or partially, financially responsible for it. Howard Murphy expressed understanding and agreed to proceed.   Historic Elements   Howard Murphy is a 63 y.o. year old, male patient evaluated today after his last encounter by our practice on 03/30/2019. Howard Murphy  has a past medical history of Arthritis, Back ache (05/05/2013), Diabetes mellitus without complication ([place]), GERD (gastroesophageal reflux disease), Headache,  History of kidney stones, Hyperlipemia, Hypertension, Kidney stones, Kidney stones, Nephrolithiasis, and Shortness of breath dyspnea. He also  has a past surgical history that includes Cholecystectomy (2000); Shoulder surgery (Right, 2016); Colonoscopy with propofol (N/A, 04/11/2015); Esophagogastroduodenoscopy (egd) with propofol (N/A, 04/11/2015); Shoulder surgery (Left, 06/2015); Back surgery (2015); Back surgery (08/09/2016); Cystoscopy with ureteroscopy and stent placement (Right, 08/27/2017); laparoscopic appendectomy (N/A, 12/18/2017); Appendectomy; and Back surgery (Bilateral, 2019). Howard Murphy has a current medication list which includes the following prescription(s): albuterol, atorvastatin, canagliflozin, chlorthalidone, cholecalciferol, hydroxyzine, lisinopril, meclizine, ondansetron, oxycodone hcl, oxycodone hcl, oxycodone hcl, potassium chloride, tamsulosin, testosterone cypionate, tizanidine, gabapentin, and melatonin. He  reports that he has never smoked. He quit smokeless tobacco use about 6 years ago.  His smokeless tobacco use included chew. He reports that he does not drink alcohol or use drugs. Howard Murphy is allergic to adhesive [tape].   HPI  Today, he is being contacted for medication management.  On 02/23/2019 I went from oxycodone IR 20 mg/day to 30 mg/day.  I also gave him a prescription to get him started on pregabalin (Lyrica) to help him with the neurogenic component of his pain, as well as a prescription for Flexeril to help him with the muscular component of his pain.  In addition, I had referred him to 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  It was scheduled with Dr. Cari Caraway for 03/30/2019.  However, he indicated that he has not been able to see him because he has an outstanding bill at the [facility] and cannot be seen until he settles that bill.  He is not taking the Flexeril that I  prescribed for him because he indicates it gave him dry mouth.  He did not get the pregabalin (Lyrica) that I prescribed filled either.  Actually, he initially said that it did not work for him however, I guess he did not realize that it was a controlled substance and that it appears in the PMP and that I could see that he did not even get it filled.  Review of the electronic medical record with suggest that he still has not seen Dr. Cari Caraway.  Pharmacotherapy Assessment  Analgesic: Oxycodone IR 10 mg 1 tab PO q 8 hrs (30 mg/day of oxycodone) MME/day:45mg /day.   Monitoring: Pharmacotherapy: No side-effects or adverse reactions reported. [place] PMP: PDMP reviewed during this encounter.       Compliance: No problems identified. Effectiveness: Clinically acceptable. Plan: Refer to "POC".  UDS:  Summary  Date Value Ref Range Status  08/06/2018 FINAL  Final    Comment:    ==================================================================== TOXASSURE SELECT 13 (MW) ==================================================================== Test                             Result       Flag       Units Drug Present and Declared for Prescription Verification   Oxycodone                      1457         EXPECTED   ng/mg creat   Oxymorphone                    918          EXPECTED   ng/mg creat   Noroxycodone                   2805         EXPECTED   ng/mg creat   Noroxymorphone                 480          EXPECTED   ng/mg creat    Sources of oxycodone are scheduled prescription medications.    Oxymorphone, noroxycodone, and noroxymorphone are expected    metabolites of oxycodone. Oxymorphone is also available as a    scheduled prescription medication. ==================================================================== Test                      Result    Flag   Units      Ref Range   Creatinine              202              mg/dL       >=20 ==================================================================== Declared Medications:  The flagging and interpretation on this report are based on the  following declared medications.  Unexpected results may arise from  inaccuracies in the declared medications.  **Note: The testing scope of this panel includes these medications:  Oxycodone (Percocet)  **Note: The testing scope of this panel does not include following  reported medications:  Acetaminophen  Acetaminophen (Percocet)  Albuterol  Atorvastatin  Celecoxib  Chlorthalidone  Gabapentin (Neurontin)  Meclizine (Antivert)  Methocarbamol (Robaxin)  Ondansetron (Zofran)  Potassium  Ranitidine  Sitagliptin ==================================================================== For clinical consultation, please call [phone]. ====================================================================    Laboratory Chemistry Profile (12 mo)  Renal: 02/15/2019: BUN 16; Creatinine, Ser 0.95  Lab Results  Component Value Date  GFRAA >60 02/15/2019   GFRNONAA >60 02/15/2019   Hepatic: 02/15/2019: Albumin 4.1 Lab Results  Component Value Date   AST 36 02/15/2019   ALT 32 02/15/2019   Other: No results found for requested labs within last 8760 hours. Note: Above Lab results reviewed.  Imaging  Last 90 days:  Dg Chest 2 View  Result Date: 02/15/2019 CLINICAL DATA:  Chest pain with shortness of breath EXAM: CHEST - 2 VIEW COMPARISON:  05/29/2017 FINDINGS: Normal heart size and mediastinal contours. Chronic elevation of the right diaphragm. There is no edema, consolidation, effusion, or pneumothorax. Cholecystectomy clips. Lower thoracic spondylosis. IMPRESSION: No emergent finding or change from 2018. Electronically Signed   By: Monte Fantasia M.D.   On: 02/15/2019 10:17    Assessment  The primary encounter diagnosis was Chronic pain syndrome. Diagnoses of Chronic low back pain (Primary Area of Pain) (Bilateral) (L>R),  Chronic shoulder pain (Secondary area of Pain) (Bilateral) (L>R), Chronic foot pain (Third area of Pain) (Bilateral) (L>R), Chronic hand pain (Fourth area of Pain) (Bilateral) (R>L), Chronic musculoskeletal pain, and Insomnia secondary to chronic pain were also pertinent to this visit.  Plan of Care  I have discontinued Shanon Brow C. Omary's cyclobenzaprine. I have also changed his Melatonin. Additionally, I am having him start on Oxycodone HCl. Lastly, I am having him maintain his atorvastatin, albuterol, chlorthalidone, potassium chloride, ondansetron, meclizine, tamsulosin, lisinopril, testosterone cypionate, hydrOXYzine, cholecalciferol, canagliflozin, tiZANidine, Oxycodone HCl, gabapentin, and Oxycodone HCl.  Pharmacotherapy (Medications Ordered): Meds ordered this encounter  Medications  . Oxycodone HCl 10 MG TABS    Sig: Take 1 tablet (10 mg total) by mouth every 8 (eight) hours. Must last 30 days.    Dispense:  90 tablet    Refill:  0    Chronic Pain: STOP Act (Not applicable) Fill 1 day early if closed on refill date. Do not fill until: 06/30/2019. To last until: 07/30/2019. Avoid benzodiazepines within 8 hours of opioids  . Oxycodone HCl 10 MG TABS    Sig: Take 1 tablet (10 mg total) by mouth every 8 (eight) hours. Must last 30 days.    Dispense:  90 tablet    Refill:  0    Chronic Pain: STOP Act (Not applicable) Fill 1 day early if closed on refill date. Do not fill until: 07/30/2019. To last until: 08/29/2019. Avoid benzodiazepines within 8 hours of opioids  . Melatonin 10 MG CAPS    Sig: Take 20 mg by mouth at bedtime.    Dispense:  60 capsule    Refill:  5    Fill one day early if pharmacy is closed on scheduled refill date. May substitute for generic if available.   Orders:  No orders of the defined types were placed in this encounter.  Follow-up plan:   Return in about 4 months (around 08/26/2019) for (VV), (MM).      Interventional management options: Planned, scheduled,  and/or pending:      Considering:   Diagnostic bilateral cervical facet block Possible bilateral cervical facet RFA  Diagnostic right greater occipital NB Possible right GON RFA  Possible right GON peripheral nerve stimulator trial  Diagnostic right CESI  Diagnostic bilateral IA shoulder injection Diagnostic bilateral suprascapular NB Possible bilateral suprascapular nerve RFA  Diagnostic bilateral SI joint block Possible bilateral SI joint RFA  Diagnostic bilateral lumbar facet block Possible bilateral lumbar facet RFA  Diagnostic left L4 TFESI  Diagnostic left L4-5 LESI  Diagnostic left IA hip joint injection Possible left hip  joint RFA  Diagnostic bilateral median nerve block(carpal tunnel block)  Diagnostic right CPB   Palliative PRN treatment(s):   Palliative/Diagnostic left SI Block (Area of L5 Transverse process spatulation) #2  Palliative IM Toradol/Norflex 60/60 (PRN)     Recent Visits Date Type Provider Dept  03/30/19 Office Visit Milinda Pointer, MD [facility]  02/23/19 Office Visit Milinda Pointer, MD [facility]  Showing recent visits within past 90 days and meeting all other requirements   Today's Visits Date Type Provider Dept  05/04/19 Telemedicine Milinda Pointer, MD [facility]  Showing today's visits and meeting all other requirements   Future Appointments No visits were found meeting these conditions.  Showing future appointments within next 90 days and meeting all other requirements   I discussed the assessment and treatment plan with the patient. The patient was provided an opportunity to ask questions and all were answered. The patient agreed with the plan and demonstrated an understanding of the instructions.  Patient advised to call back or seek an in-person evaluation if the symptoms or condition worsens.  Total duration of non-face-to-face encounter: 13 minutes.  Note by: Gaspar Cola,  MD Date: 05/04/2019; Time: 8:39 AM  Note: This dictation was prepared with Dragon dictation. Any transcriptional errors that may result from this process are unintentional.  Disclaimer:  * Given the special circumstances of the COVID-19 pandemic, the federal government has announced that the Office for Civil Rights (OCR) will exercise its enforcement discretion and will not impose penalties on physicians using telehealth in the event of noncompliance with regulatory requirements under the [place] (HIPAA) in connection with the good faith provision of telehealth during the XX123456 national public health emergency. ([place])

## 2019-05-04 ENCOUNTER — Other Ambulatory Visit: Payer: Self-pay

## 2019-05-04 ENCOUNTER — Ambulatory Visit: Payer: Medicare Other | Attending: Pain Medicine | Admitting: Pain Medicine

## 2019-05-04 DIAGNOSIS — G4701 Insomnia due to medical condition: Secondary | ICD-10-CM

## 2019-05-04 DIAGNOSIS — M79641 Pain in right hand: Secondary | ICD-10-CM

## 2019-05-04 DIAGNOSIS — M7918 Myalgia, other site: Secondary | ICD-10-CM

## 2019-05-04 DIAGNOSIS — M79642 Pain in left hand: Secondary | ICD-10-CM

## 2019-05-04 DIAGNOSIS — M545 Low back pain: Secondary | ICD-10-CM

## 2019-05-04 DIAGNOSIS — M25511 Pain in right shoulder: Secondary | ICD-10-CM | POA: Diagnosis not present

## 2019-05-04 DIAGNOSIS — G894 Chronic pain syndrome: Secondary | ICD-10-CM

## 2019-05-04 DIAGNOSIS — G8929 Other chronic pain: Secondary | ICD-10-CM | POA: Diagnosis not present

## 2019-05-04 DIAGNOSIS — G5793 Unspecified mononeuropathy of bilateral lower limbs: Secondary | ICD-10-CM

## 2019-05-04 DIAGNOSIS — M25512 Pain in left shoulder: Secondary | ICD-10-CM

## 2019-05-04 MED ORDER — OXYCODONE HCL 10 MG PO TABS: 10.0000 mg | ORAL_TABLET | Freq: Three times a day (TID) | ORAL | 0 refills | Status: DC

## 2019-05-04 MED ORDER — MELATONIN 10 MG PO CAPS: 20.0000 mg | ORAL_CAPSULE | Freq: Every day | ORAL | 5 refills | Status: DC

## 2019-05-11 DIAGNOSIS — E1165 Type 2 diabetes mellitus with hyperglycemia: Secondary | ICD-10-CM | POA: Diagnosis not present

## 2019-05-11 DIAGNOSIS — F419 Anxiety disorder, unspecified: Secondary | ICD-10-CM | POA: Diagnosis not present

## 2019-05-22 ENCOUNTER — Other Ambulatory Visit: Payer: Self-pay

## 2019-05-22 ENCOUNTER — Emergency Department
Admission: EM | Admit: 2019-05-22 | Discharge: 2019-05-22 | Disposition: A | Discharge status: Home / Self Care | Payer: Medicare Other | Attending: Emergency Medicine | Admitting: Emergency Medicine

## 2019-05-22 DIAGNOSIS — Z87891 Personal history of nicotine dependence: Secondary | ICD-10-CM | POA: Insufficient documentation

## 2019-05-22 DIAGNOSIS — F419 Anxiety disorder, unspecified: Secondary | ICD-10-CM | POA: Diagnosis not present

## 2019-05-22 DIAGNOSIS — Z79899 Other long term (current) drug therapy: Secondary | ICD-10-CM | POA: Insufficient documentation

## 2019-05-22 DIAGNOSIS — E119 Type 2 diabetes mellitus without complications: Secondary | ICD-10-CM | POA: Diagnosis not present

## 2019-05-22 DIAGNOSIS — I10 Essential (primary) hypertension: Secondary | ICD-10-CM | POA: Insufficient documentation

## 2019-05-22 MED ORDER — LORAZEPAM 1 MG PO TABS
1.0000 mg | ORAL_TABLET | Freq: Once | ORAL | Status: AC
  Administered 2019-05-22: 1 mg via ORAL
  Filled 2019-05-22: qty 1

## 2019-05-22 NOTE — Discharge Instructions (Addendum)
Follow-up at the [facility] clinic today as planned.  Return to the ER immediately for new, worsening, or persistent severe anxiety, any racing or uncontrollable thoughts, hearing voices, or any thoughts of wanting to hurt yourself or anyone else.  You should also return if you have significant chest pain or shortness of breath.

## 2019-05-22 NOTE — ED Notes (Signed)
He is requesting  "I need some medication to help my anziety  - I feel like everything in closing in on me"   Pt reports that he takes hydroxyzine 25mg  for anxiety  "it is not helping"

## 2019-05-22 NOTE — ED Notes (Signed)
Signature page not available on this computer at time of discharge

## 2019-05-22 NOTE — ED Notes (Signed)
BEHAVIORAL HEALTH ROUNDING Patient sleeping: No. Patient alert and oriented: yes Behavior appropriate: Yes.  ; If no, describe:  Nutrition and fluids offered: yes Toileting and hygiene offered: Yes  Sitter present: q15 minute observations  

## 2019-05-22 NOTE — ED Notes (Signed)
BEHAVIORAL HEALTH ROUNDING Patient sleeping: No. Patient alert and oriented: yes Behavior appropriate: Yes.  ; If no, describe:  Nutrition and fluids offered: yes Toileting and hygiene offered: Yes  Sitter present: q15 minute observations  Law enforcement present: Yes  [employer]   ENVIRONMENTAL ASSESSMENT Potentially harmful objects out of patient reach: Yes.   Personal belongings secured: Yes.   Patient dressed in hospital provided attire only: Yes.   Plastic bags out of patient reach: Yes.   Patient care equipment (cords, cables, call bells, lines, and drains) shortened, removed, or accounted for: Yes.   Equipment and supplies removed from bottom of stretcher: Yes.   Potentially toxic materials out of patient reach: Yes.   Sharps container removed or out of patient reach: Yes.   

## 2019-05-22 NOTE — ED Triage Notes (Signed)
Pt states that he has been having anxiety attacks, states that he called his dr to see if they could adjust his medicine but they haven't called him back. Pt is breathing heavy at this time and states that he is claustrophobic and that he feels like the room is closing in on him and that this has been happening for awhile. Pt is unable to tolerate a mask at this time

## 2019-05-22 NOTE — ED Provider Notes (Signed)
[facility] Emergency Department Provider Note ____________________________________________   First MD Initiated Contact with Patient 05/22/19 1242     (approximate)  I have reviewed the triage vital signs and the nursing notes.   HISTORY  Chief Complaint Anxiety and Shortness of Breath    HPI Howard Murphy is a 63 y.o. male with PMH as noted below who presents with anxiety, gradual onset over the last several weeks, and manifesting in episodes which he refers to his claustrophobia.  The patient states that periodically throughout the day he will feel a sudden sense of anxiety and fear, and feels like the room is closing in on him.  He endorses shortness of breath and chest discomfort when this happens.  He states that he has had difficulty sleeping.  He was prescribed hydroxyzine a few weeks ago but states it is not helping.  He denies any other acute physical symptoms.  He denies any SI or HI, and denies any uncontrollable or invasive thoughts or voices.  Past Medical History:  Diagnosis Date  . 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  . Back ache 05/05/2013  . Diabetes mellitus without complication ([place])   . GERD (gastroesophageal reflux disease)   . Headache   . History of kidney stones   . Hyperlipemia   . Hypertension   . Kidney stones   . Kidney stones   . Nephrolithiasis   . Shortness of breath dyspnea     Patient Active Problem List   Diagnosis Date Noted  . Osteoarthritis involving multiple joints 02/23/2019  . Chronic musculoskeletal pain 02/23/2019  . Insomnia secondary to chronic pain 02/23/2019  . Neurogenic claudication 04/02/2018  . Chronic bilateral low back pain with bilateral sciatica 03/05/2018  . Spondylosis without myelopathy or radiculopathy, lumbosacral region 01/14/2018  . Other specified dorsopathies, sacral and sacrococcygeal region 12/26/2017  . Acute appendicitis 12/18/2017  . Medication monitoring encounter 03/07/2017    . 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 Spondylolisthesis at L3-L4 level 08/03/2016  . Chronic pain syndrome 07/09/2016  . Encounter for medication monitoring 02/24/2016  . Cervical spondylosis 12/17/2015  . Cervical facet arthropathy 12/17/2015  . Chronic neck pain (Bilateral) (R>L) 12/17/2015  . Cervical facet syndrome (Bilateral) (R>L) 12/17/2015  . Chronic upper extremity pain (Bilateral) (R>L) 12/17/2015  . Cervical foraminal stenosis 12/17/2015  .  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 Arthropathy of shoulder (Right) 12/17/2015  . 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 Lumbar foraminal stenosis (L4-5) (Left) 12/17/2015  . Grade 1 Anterolisthesis (2 mm) of L4 over L5. 12/17/2015  . Lumbar facet (L4-5) synovial cyst (6 mm) (Left) 12/17/2015  . Chronic sacroiliac joint pain (Bilateral) (L>R) 12/08/2015  . Neurogenic pain 12/08/2015  . Abnormal MRI, lumbar spine 11/21/2015  . Radicular pain of shoulder (Right) 10/03/2015  . Carpal tunnel syndrome (Bilateral) 10/03/2015  . Chronic cervical radicular pain (Right) 10/03/2015  . Chronic low back pain (Primary Area of Pain) (Bilateral) (L>R) 09/06/2015  . Opiate use (60 MME/Day) 09/05/2015  . Long term prescription opiate use 09/05/2015  . Long term current use of opiate analgesic 09/05/2015  . Spondylosis of lumbar spine 09/05/2015  . Lumbar facet joint syndrome ([place]) 09/05/2015  . Chronic hip pain (Left) 09/05/2015  . Diabetic peripheral neuropathy ([place]) 09/05/2015  . Chronic lumbar radicular pain (S1 Dermatome) (Bilateral) (L>R) 09/05/2015  . Hypokalemia 09/05/2015  . Chronic shoulder pain (  Secondary area of Pain) (Bilateral) (L>R) 09/05/2015  . Diabetes ([place]) 09/05/2015  . Chronic foot pain (Third area of Pain) (Bilateral) (L>R) 09/05/2015  . Chronic hand pain (Fourth area of Pain) (Bilateral) (R>L)  09/05/2015  . Lumbar facet hypertrophy 09/05/2015  . Encounter for therapeutic drug level monitoring 09/05/2015  . Low testosterone 09/05/2015  . Occipital pain (Right) 09/05/2015  . 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 Hypertriglyceridemia 07/26/2015  . Chronic abdominal pain (RUQ) 07/14/2015  . Type 2 diabetes mellitus with peripheral neuropathy ([place]) 07/14/2015  . Chest pain 07/14/2015  . Complete tear of the shoulder rotator cuff (Left) 05/10/2015  . Complete tear of left rotator cuff 05/10/2015  . SOB (shortness of breath) 03/22/2015  . Elevated hemoglobin ([place]) 07/07/2013  . History of lumbar facet Synovial cyst, surgically removed. 05/29/2013    Class: History of  . 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 GERD (gastroesophageal reflux disease) 05/05/2013  . Benign prostatic hyperplasia 05/05/2013  . Hypertension 05/08/2011  . Hyperlipidemia 05/08/2011    Past Surgical History:  Procedure Laterality Date  . APPENDECTOMY    . BACK SURGERY  2015   x3  . BACK SURGERY  08/09/2016  . BACK SURGERY Bilateral 2019  . CHOLECYSTECTOMY  2000  . COLONOSCOPY WITH PROPOFOL N/A 04/11/2015   Procedure: COLONOSCOPY WITH PROPOFOL;  Surgeon: Manya Silvas, MD;  Location: [facility] ENDOSCOPY;  Service: Endoscopy;  Laterality: N/A;  . CYSTOSCOPY WITH URETEROSCOPY AND STENT PLACEMENT Right 08/27/2017   Procedure: CYSTOSCOPY WITH URETEROSCOPY AND STENT PLACEMENT;  Surgeon: Abbie Sons, MD;  Location: [facility] ORS;  Service: Urology;  Laterality: Right;  . ESOPHAGOGASTRODUODENOSCOPY (EGD) WITH PROPOFOL N/A 04/11/2015   Procedure: ESOPHAGOGASTRODUODENOSCOPY (EGD) WITH PROPOFOL;  Surgeon: Manya Silvas, MD;  Location: [facility] ENDOSCOPY;  Service: Endoscopy;  Laterality: N/A;  . LAPAROSCOPIC APPENDECTOMY N/A 12/18/2017   Procedure: APPENDECTOMY LAPAROSCOPIC;  Surgeon: Florene Glen, MD;  Location: [facility] ORS;  Service: General;  Laterality: N/A;  . SHOULDER SURGERY Right 2016  . SHOULDER  SURGERY Left 06/2015    Prior to Admission medications   Medication Sig Start Date End Date Taking? Authorizing Provider  albuterol (PROVENTIL HFA;VENTOLIN HFA) 108 (90 Base) MCG/ACT inhaler Inhale 2 puffs into the lungs every 6 (six) hours as needed for wheezing or shortness of breath. 05/29/17   Fisher, Linden Dolin, PA-C  atorvastatin (LIPITOR) 40 MG tablet Take 1 tablet (40 mg total) by mouth at bedtime. 03/08/17   Arnetha Courser, MD  canagliflozin (INVOKANA) 100 MG TABS tablet Take 100 mg by mouth daily before breakfast.    [provider]  chlorthalidone (HYGROTON) 50 MG tablet Take 50 mg by mouth daily.    [provider]  cholecalciferol (VITAMIN D) 25 MCG (1000 UT) tablet Take 1 tablet by mouth daily. 02/26/19   [provider]  gabapentin (NEURONTIN) 800 MG tablet Take 1,600 mg by mouth 2 (two) times daily. 03/25/19   [provider]  hydrOXYzine (ATARAX/VISTARIL) 25 MG tablet Take 1 tablet (25 mg total) by mouth 3 (three) times daily as needed for anxiety. 02/15/19   Vanessa Boulder, MD  lisinopril (ZESTRIL) 20 MG tablet Take 20 mg by mouth daily. 09/18/18   [provider]  meclizine (ANTIVERT) 25 MG tablet Take 25 mg by mouth every 6 (six) hours as needed for dizziness.    [provider]  Melatonin 10 MG CAPS Take 20 mg by mouth at bedtime. 05/04/19 10/31/19  Milinda Pointer, MD  ondansetron (ZOFRAN ODT) 4 MG disintegrating tablet Take 1 tablet (4 mg total) by mouth every 8 (eight) hours as needed for nausea or vomiting. 12/29/17   Darel Hong, MD  Oxycodone HCl 10 MG TABS Take 1 tablet (10 mg total) by mouth every 8 (eight) hours. Must last 30 days. 05/31/19 06/30/19  Milinda Pointer, MD  Oxycodone HCl 10 MG TABS Take 1 tablet (10 mg total) by mouth every 8 (eight) hours. Must last 30 days. 06/30/19 07/30/19  Milinda Pointer, MD  Oxycodone HCl 10 MG TABS Take 1 tablet (10 mg total) by mouth every 8 (eight) hours. Must last 30 days.  07/30/19 08/29/19  Milinda Pointer, MD  potassium chloride (K-DUR) 10 MEQ tablet Take 1 tablet (10 mEq total) by mouth daily. Patient taking differently: Take 10 mEq by mouth daily as needed (For low potassium when instructred by his doctor.).  08/23/17   Stoioff, Ronda Fairly, MD  tamsulosin (FLOMAX) 0.4 MG CAPS capsule Take 1 capsule (0.4 mg total) by mouth daily. 11/04/18   Billey Co, MD  testosterone cypionate (DEPOTESTOSTERONE CYPIONATE) 200 MG/ML injection Inject 200 mg into the muscle every 14 (fourteen) days. 10/07/18   [provider]  tiZANidine (ZANAFLEX) 4 MG tablet Take 1 tablet (4 mg total) by mouth every 8 (eight) hours as needed for muscle spasms. 04/01/19 09/28/19  Milinda Pointer, MD    Allergies Adhesive [tape]  Family History  Problem Relation Age of Onset  . Cancer Mother        Breast CA  . Stroke Father   . Heart disease Father   . Cancer Sister   . Cancer Maternal Grandmother   . Cancer Sister   . Bladder Cancer Neg Hx   . Prostate cancer Neg Hx   . Kidney cancer Neg Hx     Social History Social History   Tobacco Use  . Smoking status: Never Smoker  . Smokeless tobacco: Former Systems developer    Types: Chew  Substance Use Topics  . Alcohol use: No  . Drug use: No    Review of Systems  Constitutional: No fever/chills. Eyes: No redness. ENT: No sore throat. Cardiovascular: Denies active chest pain. Respiratory: Positive for intermittent shortness of breath. Gastrointestinal: No vomiting. Genitourinary: Negative for dysuria.  Musculoskeletal: Negative for back pain. Skin: Negative for rash. Neurological: Negative for headache.   ____________________________________________   PHYSICAL EXAM:  VITAL SIGNS: ED Triage Vitals  Enc Vitals Group     BP 05/22/19 1142 (!) 141/93     Pulse Rate 05/22/19 1142 (!) 105     Resp 05/22/19 1142 (!) 24     Temp 05/22/19 1142 98.3 F (36.8 C)     Temp Source 05/22/19 1142 Oral     SpO2 05/22/19 1142  97 %     Weight 05/22/19 1143 195 lb (88.5 kg)     Height 05/22/19 1143 5\' 7"  (1.702 m)     Head Circumference --      Peak Flow --      Pain Score 05/22/19 1143 0     Pain Loc --      Pain Edu? --      Excl. in [place]? --     Constitutional: Alert and oriented.  Anxious appearing but in no acute distress. Eyes: Conjunctivae are normal.  Head: Atraumatic. Nose: No congestion/rhinnorhea. Mouth/Throat: Mucous membranes are moist.   Neck: Normal range of motion.  Cardiovascular: Normal rate, regular rhythm. Good peripheral circulation. Respiratory: Normal respiratory effort.  No retractions. Gastrointestinal: No distention.  Musculoskeletal: Extremities warm and well perfused.  Neurologic:  Normal speech and language. No gross focal neurologic deficits are appreciated.  Skin:  Skin is warm and dry. No rash noted. Psychiatric: Anxious appearing.  Speech and behavior are normal.  ____________________________________________   LABS (all labs ordered are listed, but only abnormal results are displayed)  Labs Reviewed - No data to display ____________________________________________  EKG  ED ECG REPORT I, Arta Silence, the attending physician, personally viewed and interpreted this ECG.  Date: 05/22/2019 EKG Time: 1407 Rate: 90 Rhythm: normal sinus rhythm QRS Axis: Left axis Intervals: normal ST/T Wave abnormalities: normal Narrative Interpretation: no evidence of acute ischemia; no significant change when compared to EKG of 02/15/2019  ____________________________________________  RADIOLOGY    ____________________________________________   PROCEDURES  Procedure(s) performed: No  Procedures  Critical Care performed: No ____________________________________________   INITIAL IMPRESSION / ASSESSMENT AND PLAN / ED COURSE  Pertinent labs & imaging results that were available during my care of the patient were reviewed by me and considered in my medical decision  making (see chart for details).  63 year old male with PMH as noted above presents with worsening anxiety over the last several weeks, with recurrent episodes that he refers to as "claustrophobia" but based on his description seem to be akin to panic/anxiety attacks.  He reports some associated chest tightness and shortness of breath when they happen, but is not having that currently.  He was prescribed hydroxyzine from the [place] [facility] where he follows for primary care, but it has not helped.  He has no SI or HI.  On exam the patient is anxious but otherwise well-appearing.  His vital signs are normal.  The physical exam is unremarkable.  EKG shows no ischemic changes.  Overall presentation is consistent with anxiety/panic attacks.  Given the subacute nature of the symptoms and the fact that the physical symptoms seem to be secondary to the anxiety, there is no indication for medical work-up.  I obtained an EKG to rule out any immediate cardiac etiology.  I will give a dose of Ativan and reassess.  The patient actually plans to go to the PMD this afternoon but felt that he needed something more immediate to help with the symptoms.  He has no SI or HI or any indication for emergent psychiatric evaluation.  ----------------------------------------- 3:58 PM on 05/22/2019 -----------------------------------------  The patient was feeling much better after Ativan.  He was discharged with the plan to follow-up with the PMD immediately after the ER visit.  Return precautions were given, and he expressed understanding.  ____________________________________________   FINAL CLINICAL IMPRESSION(S) / ED DIAGNOSES  Final diagnoses:  Anxiety      NEW MEDICATIONS STARTED DURING THIS VISIT:  Discharge Medication List as of 05/22/2019  2:43 PM       Note:  This document was prepared using Dragon voice recognition software and may include unintentional dictation errors.    Arta Silence,  MD 05/22/19 1558

## 2019-06-30 DIAGNOSIS — E785 Hyperlipidemia, unspecified: Secondary | ICD-10-CM | POA: Diagnosis not present

## 2019-06-30 DIAGNOSIS — G894 Chronic pain syndrome: Secondary | ICD-10-CM | POA: Diagnosis not present

## 2019-08-11 DIAGNOSIS — F411 Generalized anxiety disorder: Secondary | ICD-10-CM | POA: Diagnosis not present

## 2019-08-25 ENCOUNTER — Encounter: Payer: Self-pay | Admitting: Pain Medicine

## 2019-08-25 NOTE — Progress Notes (Signed)
Patient: Howard Murphy  Service Category: E/M  Provider: Gaspar Cola, MD  DOB: 1956-05-21  DOS: 08/26/2019  Location: Office  MRN: [id number]  Setting: Ambulatory outpatient  Referring Provider: [facility], [place]*  Type: Established Patient  Specialty: Interventional Pain Management  PCP: [facility], [place]  Location: Remote location  Delivery: TeleHealth     Virtual Encounter - Pain Management PROVIDER NOTE: Information contained herein reflects review and annotations entered in association with encounter. Interpretation of such information and data should be left to medically-trained personnel. Information provided to patient can be located elsewhere in the medical record under "Patient Instructions". Document created using STT-dictation technology, any transcriptional errors that may result from process are unintentional.    Contact & Pharmacy Preferred: [phone] Home: [phone] (home) Mobile: [phone] (mobile) E-mail: No e-mail address on record  [place], [place] [address] Phone: [phone] Fax: [phone]   Pre-screening  Howard Murphy offered "in-person" vs "virtual" encounter. He indicated preferring virtual for this encounter.   Reason COVID-19*  Social distancing based on CDC and AMA recommendations.   I contacted Howard Murphy on 08/26/2019 via telephone.      I clearly identified myself as Gaspar Cola, MD. I verified that I was speaking with the correct person using two identifiers (Name: Howard Murphy, and date of birth: 04/24/1956).  Consent I sought verbal advanced consent from Howard Murphy for virtual visit interactions. I informed Howard Murphy of possible security and privacy concerns, risks, and limitations associated with providing "not-in-person" medical evaluation and management services. I also informed Howard Murphy of  the availability of "in-person" appointments. Finally, I informed him that there would be a charge for the virtual visit and that he could be  personally, fully or partially, financially responsible for it. Howard Murphy expressed understanding and agreed to proceed.   Historic Elements   Howard Murphy is a 64 y.o. year old, male patient evaluated today after his last contact with our practice on 03/30/2019. Howard Murphy  has a past medical history of Arthritis, Back ache (05/05/2013), Diabetes mellitus without complication ([place]), GERD (gastroesophageal reflux disease), Headache, History of kidney stones, Hyperlipemia, Hypertension, Kidney stones, Kidney stones, Nephrolithiasis, and Shortness of breath dyspnea. He also  has a past surgical history that includes Cholecystectomy (2000); Shoulder surgery (Right, 2016); Colonoscopy with propofol (N/A, 04/11/2015); Esophagogastroduodenoscopy (egd) with propofol (N/A, 04/11/2015); Shoulder surgery (Left, 06/2015); Back surgery (2015); Back surgery (08/09/2016); Cystoscopy with ureteroscopy and stent placement (Right, 08/27/2017); laparoscopic appendectomy (N/A, 12/18/2017); Appendectomy; and Back surgery (Bilateral, 2019). Howard Murphy has a current medication list which includes the following prescription(s): albuterol, atorvastatin, canagliflozin, chlorthalidone, cholecalciferol, gabapentin, hydroxyzine, lisinopril, meclizine, [START ON 08/29/2019] melatonin, ondansetron, [START ON 08/29/2019] oxycodone hcl, [START ON 09/28/2019] oxycodone hcl, [START ON 10/28/2019] oxycodone hcl, potassium chloride, tamsulosin, testosterone cypionate, and [START ON 08/29/2019] tizanidine. He  reports that he has never smoked. He quit smokeless tobacco use about 6 years ago.  His smokeless tobacco use included chew. He reports that he does not drink alcohol or use drugs. Howard Murphy is allergic to adhesive [tape].   HPI  Today, he is being contacted for medication management. The  patient indicates doing well with the current medication regimen. No adverse reactions or side effects reported to the medications.   Pharmacotherapy Assessment  Analgesic: Oxycodone IR 10 mg 1 tab  PO q 8 hrs (30 mg/day of oxycodone) MME/day:31m/day.   Monitoring:  PMP: PDMP reviewed during this encounter.       Pharmacotherapy: No side-effects or adverse reactions reported. Compliance: No problems identified. Effectiveness: Clinically acceptable. Plan: Refer to "POC".  UDS:  Summary  Date Value Ref Range Status  08/06/2018 FINAL  Final    Comment:    ==================================================================== TOXASSURE SELECT 13 (MW) ==================================================================== Test                             Result       Flag       Units Drug Present and Declared for Prescription Verification   Oxycodone                      1457         EXPECTED   ng/mg creat   Oxymorphone                    918          EXPECTED   ng/mg creat   Noroxycodone                   2805         EXPECTED   ng/mg creat   Noroxymorphone                 480          EXPECTED   ng/mg creat    Sources of oxycodone are scheduled prescription medications.    Oxymorphone, noroxycodone, and noroxymorphone are expected    metabolites of oxycodone. Oxymorphone is also available as a    scheduled prescription medication. ==================================================================== Test                      Result    Flag   Units      Ref Range   Creatinine              202              mg/dL      >=20 ==================================================================== Declared Medications:  The flagging and interpretation on this report are based on the  following declared medications.  Unexpected results may arise from  inaccuracies in the declared medications.  **Note: The testing scope of this panel includes these medications:  Oxycodone (Percocet)  **Note:  The testing scope of this panel does not include following  reported medications:  Acetaminophen  Acetaminophen (Percocet)  Albuterol  Atorvastatin  Celecoxib  Chlorthalidone  Gabapentin (Neurontin)  Meclizine (Antivert)  Methocarbamol (Robaxin)  Ondansetron (Zofran)  Potassium  Ranitidine  Sitagliptin ==================================================================== For clinical consultation, please call ([phone] ====================================================================    Laboratory Chemistry Profile   Renal Lab Results  Component Value Date   BUN 16 02/15/2019   CREATININE 0.95 02/15/2019   LABCREA 182 02/24/2016   BCR 14 03/07/2017   GFRAA >60 02/15/2019   GFRNONAA >60 02/15/2019    Hepatic Lab Results  Component Value Date   AST 36 02/15/2019   ALT 32 02/15/2019   ALBUMIN 4.1 02/15/2019   ALKPHOS 45 02/15/2019   LIPASE 46 12/03/2018    Electrolytes Lab Results  Component Value Date   NA 137 02/15/2019   K 3.7 02/15/2019   CL 105 02/15/2019   CALCIUM 9.1 02/15/2019   MG 2.0 11/29/2016    Bone Lab Results  Component Value Date   25OHVITD1 24 (L) 11/29/2016   25OHVITD2 <1.0 11/29/2016   25OHVITD3 24 11/29/2016    Inflammation (CRP: Acute Phase) (ESR: Chronic Phase) Lab Results  Component Value Date   CRP 1.8 11/29/2016   ESRSEDRATE 11 11/29/2016      Note: Above Lab results reviewed.  Imaging  DG Chest 2 View CLINICAL DATA:  Chest pain with shortness of breath  EXAM: CHEST - 2 VIEW  COMPARISON:  05/29/2017  FINDINGS: Normal heart size and mediastinal contours. Chronic elevation of the right diaphragm. There is no edema, consolidation, effusion, or pneumothorax. Cholecystectomy clips. Lower thoracic spondylosis.  IMPRESSION: No emergent finding or change from 2018.  Electronically Signed   By: Monte Fantasia M.D.   On: 02/15/2019 10:17  Assessment  The primary encounter diagnosis was Chronic pain syndrome.  Diagnoses of Chronic low back pain (1ry area of Pain) (Bilateral) (L>R) w/o sciatica, Chronic shoulder pain (2ry area of Pain) (Bilateral) (L>R), Chronic foot pain (3ry area of Pain) (Bilateral) (L>R), Chronic hand pain (4th area of Pain) (Bilateral) (R>L), Chronic low back pain (Primary Area of Pain) (Bilateral) (L>R), Chronic musculoskeletal pain, and Insomnia secondary to chronic pain were also pertinent to this visit.  Plan of Care  Problem-specific:  No problem-specific Assessment & Plan notes found for this encounter.  Howard Murphy has a current medication list which includes the following long-term medication(s): albuterol, atorvastatin, chlorthalidone, [START ON 08/29/2019] melatonin, [START ON 08/29/2019] oxycodone hcl, [START ON 09/28/2019] oxycodone hcl, [START ON 10/28/2019] oxycodone hcl, potassium chloride, and [START ON 08/29/2019] tizanidine.  Pharmacotherapy (Medications Ordered): Meds ordered this encounter  Medications  . Oxycodone HCl 10 MG TABS    Sig: Take 1 tablet (10 mg total) by mouth every 8 (eight) hours. Must last 30 days.    Dispense:  90 tablet    Refill:  0    Chronic Pain: STOP Act (Not applicable) Fill 1 day early if closed on refill date. Do not fill until: 08/29/2019. To last until: 09/28/2019. Avoid benzodiazepines within 8 hours of opioids  . Oxycodone HCl 10 MG TABS    Sig: Take 1 tablet (10 mg total) by mouth every 8 (eight) hours. Must last 30 days.    Dispense:  90 tablet    Refill:  0    Chronic Pain: STOP Act (Not applicable) Fill 1 day early if closed on refill date. Do not fill until: 09/28/2019. To last until: 10/28/2019. Avoid benzodiazepines within 8 hours of opioids  . Oxycodone HCl 10 MG TABS    Sig: Take 1 tablet (10 mg total) by mouth every 8 (eight) hours. Must last 30 days.    Dispense:  90 tablet    Refill:  0    Chronic Pain: STOP Act (Not applicable) Fill 1 day early if closed on refill date. Do not fill until: 10/28/2019. To last until:  11/27/2019. Avoid benzodiazepines within 8 hours of opioids  . tiZANidine (ZANAFLEX) 4 MG tablet    Sig: Take 1 tablet (4 mg total) by mouth every 8 (eight) hours as needed for muscle spasms.    Dispense:  90 tablet    Refill:  5    Fill one day early if pharmacy is closed on scheduled refill date. May substitute for generic if available.  . Melatonin 10 MG CAPS    Sig: Take 20 mg by mouth at bedtime.    Dispense:  60 capsule    Refill:  5  Fill one day early if pharmacy is closed on scheduled refill date. May substitute for generic if available.   Orders:  No orders of the defined types were placed in this encounter.  Follow-up plan:   No follow-ups on file.      Interventional management options: Planned, scheduled, and/or pending:      Considering:   Diagnostic bilateral cervical facet block Possible bilateral cervical facet RFA  Diagnostic right greater occipital NB Possible right GON RFA  Possible right GON peripheral nerve stimulator trial  Diagnostic right CESI  Diagnostic bilateral IA shoulder injection Diagnostic bilateral suprascapular NB Possible bilateral suprascapular nerve RFA  Diagnostic bilateral SI joint block Possible bilateral SI joint RFA  Diagnostic bilateral lumbar facet block Possible bilateral lumbar facet RFA  Diagnostic left L4 TFESI  Diagnostic left L4-5 LESI  Diagnostic left IA hip joint injection Possible left hip joint RFA  Diagnostic bilateral median nerve block(carpal tunnel block)  Diagnostic right CPB   Palliative PRN treatment(s):   Palliative/Diagnostic left SI Block (Area of L5 Transverse process spatulation) #2  Palliative IM Toradol/Norflex 60/60 (PRN)      Recent Visits No visits were found meeting these conditions.  Showing recent visits within past 90 days and meeting all other requirements   Today's Visits Date Type Provider Dept  08/26/19 Telemedicine Milinda Pointer, MD [facility]  Showing  today's visits and meeting all other requirements   Future Appointments No visits were found meeting these conditions.  Showing future appointments within next 90 days and meeting all other requirements   I discussed the assessment and treatment plan with the patient. The patient was provided an opportunity to ask questions and all were answered. The patient agreed with the plan and demonstrated an understanding of the instructions.  Patient advised to call back or seek an in-person evaluation if the symptoms or condition worsens.  Duration of encounter: 12 minutes.  Note by: Gaspar Cola, MD Date: 08/26/2019; Time: 8:29 AM

## 2019-08-26 ENCOUNTER — Other Ambulatory Visit: Payer: Self-pay

## 2019-08-26 ENCOUNTER — Ambulatory Visit: Payer: Medicare Other | Attending: Pain Medicine | Admitting: Pain Medicine

## 2019-08-26 DIAGNOSIS — G894 Chronic pain syndrome: Secondary | ICD-10-CM | POA: Diagnosis not present

## 2019-08-26 DIAGNOSIS — M25511 Pain in right shoulder: Secondary | ICD-10-CM | POA: Diagnosis not present

## 2019-08-26 DIAGNOSIS — M25512 Pain in left shoulder: Secondary | ICD-10-CM

## 2019-08-26 DIAGNOSIS — M79641 Pain in right hand: Secondary | ICD-10-CM | POA: Diagnosis not present

## 2019-08-26 DIAGNOSIS — M79642 Pain in left hand: Secondary | ICD-10-CM | POA: Diagnosis not present

## 2019-08-26 DIAGNOSIS — G8929 Other chronic pain: Secondary | ICD-10-CM | POA: Diagnosis not present

## 2019-08-26 DIAGNOSIS — G5793 Unspecified mononeuropathy of bilateral lower limbs: Secondary | ICD-10-CM

## 2019-08-26 DIAGNOSIS — M7918 Myalgia, other site: Secondary | ICD-10-CM

## 2019-08-26 DIAGNOSIS — G4701 Insomnia due to medical condition: Secondary | ICD-10-CM | POA: Diagnosis not present

## 2019-08-26 DIAGNOSIS — M545 Low back pain: Secondary | ICD-10-CM

## 2019-08-26 MED ORDER — OXYCODONE HCL 10 MG PO TABS: 10.0000 mg | ORAL_TABLET | Freq: Three times a day (TID) | ORAL | 0 refills | Status: DC

## 2019-08-26 MED ORDER — TIZANIDINE HCL 4 MG PO TABS: 4.0000 mg | ORAL_TABLET | Freq: Three times a day (TID) | ORAL | 5 refills | Status: DC | PRN

## 2019-08-26 MED ORDER — MELATONIN 10 MG PO CAPS: 20.0000 mg | ORAL_CAPSULE | Freq: Every day | ORAL | 5 refills | Status: DC

## 2019-08-27 ENCOUNTER — Telehealth (HOSPITAL_COMMUNITY): Payer: Self-pay | Admitting: Professional

## 2019-08-31 ENCOUNTER — Telehealth: Payer: Self-pay | Admitting: Pain Medicine

## 2019-08-31 ENCOUNTER — Telehealth: Payer: Self-pay | Admitting: *Deleted

## 2019-08-31 NOTE — Telephone Encounter (Signed)
Patient was only given 15 tablets out of script he just filled. Pharmacy tells him they are out and unable to get enough to fill script. They are going to call back with pharmacy to send new script for the rest of his meds.

## 2019-08-31 NOTE — Telephone Encounter (Signed)
Pharmacy called and stated the [facility] has enough meds to fill a script for this patient.

## 2019-08-31 NOTE — Telephone Encounter (Signed)
Called patient. States he will find out which [facility] has his medication and call us back. I will then ask Dr. Dossie Arbour if he will accommodate.

## 2019-08-31 NOTE — Telephone Encounter (Signed)
Please read this message and let me know what you advise so that I can contact the patient/pharmacy.

## 2019-09-01 ENCOUNTER — Other Ambulatory Visit: Payer: Self-pay | Admitting: Pain Medicine

## 2019-09-01 ENCOUNTER — Telehealth: Payer: Self-pay | Admitting: Pain Medicine

## 2019-09-01 ENCOUNTER — Telehealth: Payer: Self-pay | Admitting: *Deleted

## 2019-09-01 DIAGNOSIS — G894 Chronic pain syndrome: Secondary | ICD-10-CM

## 2019-09-01 MED ORDER — OXYCODONE HCL 10 MG PO TABS: 10.0000 mg | ORAL_TABLET | Freq: Three times a day (TID) | ORAL | 0 refills | Status: DC

## 2019-09-01 NOTE — Telephone Encounter (Signed)
Patient's wife called to check on status of medication. He will be out after today. Please verify that [facility] can fill the script for his pain med and check with Dr. Dossie Arbour. Thank you

## 2019-09-01 NOTE — Progress Notes (Signed)
Patient: Howard Murphy  Service Category: E/M  Provider: Gaspar Cola, MD  DOB: [birth year]  DOS: 09/01/2019  Location: Office  MRN: [id number]  Setting: Ambulatory outpatient  Referring Provider: No ref. provider found  Type: Established Patient  Specialty: Interventional Pain Management  PCP: [facility]  Location: Remote location  Delivery: Telephone encounter     [place] Preferred: [phone] Home: [phone] (home) Mobile: [phone] (mobile) E-mail: No e-mail address on record  [id number] [place], [place] - [address] [place] [address] Phone: [phone] Fax: [phone]    Note: I recently had a medication management encounter with this patient on 08/26/2019 were I conducted a full evaluation of his medication management and I sent his refills to his pharmacy, [facility].  As it has been the case lately, [facility] did not have enough medication to fill his prescription but according to the patient they did not inform him and only gave him a partial fill of 15 pills.  He claims that when he realized that he had only being given a partial field, he went back to the pharmacy and they told him that they would try to get him the rest in the next couple days.  They did not and now they have told the patient that he needs a new prescription for the rest of his pills.  This is happening way too often with [facility].  I have made an attempt to contact her pharmacists to see if something can be done about this.  Unfortunately, I am receiving similar calls up to 2-3 times a day for me to rewrite another prescription to be sent to another one of their store or to complete a partial fill that they have given to the patient.  Because of this, as of now I am informing the patient's that I will no longer be sending prescriptions to [facility] store since they are a very unreliable source of medication.  In all consciousness I cannot  continue to send prescriptions to them to continue doing such a poor job but supplying my patients with the medications that they need.  They are simply not reliable.  For this reason, I have called them and I have canceled all of the prescriptions that I had sent them for this patient and from now on the patient has [facility]  Pharmacotherapy Assessment  Analgesic: Oxycodone IR 10 mg 1 tab PO q 8 hrs (30 mg/day of oxycodone) MME/day:45mg /day.   Monitoring: [place] PMP: PDMP reviewed during this encounter.       Pharmacotherapy: No side-effects or adverse reactions reported. Compliance: No problems identified. Effectiveness: Clinically acceptable. Plan: Refer to "POC".  UDS:  Summary  Date Value Ref Range Status  08/06/2018 FINAL  Final    Comment:    ==================================================================== TOXASSURE SELECT 13 (MW) ==================================================================== Test                             Result       Flag       Units Drug Present and Declared for Prescription Verification   Oxycodone                      1457         EXPECTED   ng/mg creat   Oxymorphone  918          EXPECTED   ng/mg creat   Noroxycodone                   2805         EXPECTED   ng/mg creat   Noroxymorphone                 480          EXPECTED   ng/mg creat    Sources of oxycodone are scheduled prescription medications.    Oxymorphone, noroxycodone, and noroxymorphone are expected    metabolites of oxycodone. Oxymorphone is also available as a    scheduled prescription medication. ==================================================================== Test                      Result    Flag   Units      Ref Range   Creatinine              202              mg/dL      >=20 ==================================================================== Declared Medications:  The flagging and interpretation on this report are  based on the  following declared medications.  Unexpected results may arise from  inaccuracies in the declared medications.  **Note: The testing scope of this panel includes these medications:  Oxycodone (Percocet)  **Note: The testing scope of this panel does not include following  reported medications:  Acetaminophen  Acetaminophen (Percocet)  Albuterol  Atorvastatin  Celecoxib  Chlorthalidone  Gabapentin (Neurontin)  Meclizine (Antivert)  Methocarbamol (Robaxin)  Ondansetron (Zofran)  Potassium  Ranitidine  Sitagliptin ==================================================================== For clinical consultation, please call [phone]. ====================================================================    Pharmacotherapy (Medications Ordered): Meds ordered this encounter  Medications  . Oxycodone HCl 10 MG TABS    Sig: Take 1 tablet (10 mg total) by mouth every 8 (eight) hours. Must last 30 days.    Dispense:  90 tablet    Refill:  0    Chronic Pain: STOP Act (Not applicable) Fill 1 day early if closed on refill date. Do not fill until: 09/03/2019. To last until: 10/03/2019. Avoid benzodiazepines within 8 hours of opioids  . Oxycodone HCl 10 MG TABS    Sig: Take 1 tablet (10 mg total) by mouth every 8 (eight) hours. Must last 30 days.    Dispense:  90 tablet    Refill:  0    Chronic Pain: STOP Act (Not applicable) Fill 1 day early if closed on refill date. Do not fill until: 10/03/2019. To last until: 11/02/2019. Avoid benzodiazepines within 8 hours of opioids  . Oxycodone HCl 10 MG TABS    Sig: Take 1 tablet (10 mg total) by mouth every 8 (eight) hours. Must last 30 days.    Dispense:  90 tablet    Refill:  0    Chronic Pain: STOP Act (Not applicable) Fill 1 day early if closed on refill date. Do not fill until: 11/02/2019. To last until: 12/02/2019. Avoid benzodiazepines within 8 hours of opioids   Follow-up plan:   No follow-ups on file.      Interventional  management options: Planned, scheduled, and/or pending:      Considering:   Diagnostic bilateral cervical facet block Possible bilateral cervical facet RFA  Diagnostic right greater occipital NB Possible right GON RFA  Possible right GON peripheral nerve stimulator trial  Diagnostic right CESI  Diagnostic bilateral IA shoulder injection  Diagnostic bilateral suprascapular NB Possible bilateral suprascapular nerve RFA  Diagnostic bilateral SI joint block Possible bilateral SI joint RFA  Diagnostic bilateral lumbar facet block Possible bilateral lumbar facet RFA  Diagnostic left L4 TFESI  Diagnostic left L4-5 LESI  Diagnostic left IA hip joint injection Possible left hip joint RFA  Diagnostic bilateral median nerve block(carpal tunnel block)  Diagnostic right CPB   Palliative PRN treatment(s):   Palliative/Diagnostic left SI Block (Area of L5 Transverse process spatulation) #2  Palliative IM Toradol/Norflex 60/60 (PRN)     Recent Visits No visits were found meeting these conditions.  Showing recent visits within past 90 days and meeting all other requirements   Future Appointments No visits were found meeting these conditions.  Showing future appointments within next 90 days and meeting all other requirements   Duration of encounter: 6 minutes.  Note by: Gaspar Cola, MD Date: 09/01/2019; Time: 7:10 PM

## 2019-09-01 NOTE — Telephone Encounter (Signed)
I canceled all of his prescriptions at [facility].  I spoke to him on the phone and [facility]  [facility].  Because of the change in the dates, I need you to arrange for his next virtual visit medication management appointment to be before 12/02/2019.  Please go ahead and take care of that.

## 2019-09-01 NOTE — Telephone Encounter (Signed)
I called the patient and his wife. I informed them that I have attempted to reach and discuss this with Dr. Dossie Arbour on at least two occasions. As soon as I know anything I will contact them. He has enough medication to last through tomorrow.

## 2019-09-02 NOTE — Telephone Encounter (Signed)
Juliann Pulse,         Please take care of this for me and message me when completed. Thank you- Anderson Malta

## 2019-09-02 NOTE — Telephone Encounter (Signed)
Done per Oda Kilts.

## 2019-09-07 DIAGNOSIS — I10 Essential (primary) hypertension: Secondary | ICD-10-CM | POA: Diagnosis not present

## 2019-09-07 DIAGNOSIS — E1165 Type 2 diabetes mellitus with hyperglycemia: Secondary | ICD-10-CM | POA: Diagnosis not present

## 2019-09-07 DIAGNOSIS — G629 Polyneuropathy, unspecified: Secondary | ICD-10-CM | POA: Diagnosis not present

## 2019-09-22 ENCOUNTER — Ambulatory Visit (INDEPENDENT_AMBULATORY_CARE_PROVIDER_SITE_OTHER): Payer: Medicare Other | Admitting: Psychiatry

## 2019-09-22 ENCOUNTER — Other Ambulatory Visit: Payer: Self-pay

## 2019-09-22 DIAGNOSIS — I10 Essential (primary) hypertension: Secondary | ICD-10-CM | POA: Diagnosis not present

## 2019-09-22 DIAGNOSIS — R109 Unspecified abdominal pain: Secondary | ICD-10-CM | POA: Diagnosis not present

## 2019-09-22 DIAGNOSIS — Z1389 Encounter for screening for other disorder: Secondary | ICD-10-CM | POA: Diagnosis not present

## 2019-09-22 DIAGNOSIS — E291 Testicular hypofunction: Secondary | ICD-10-CM | POA: Diagnosis not present

## 2019-09-22 DIAGNOSIS — Z125 Encounter for screening for malignant neoplasm of prostate: Secondary | ICD-10-CM | POA: Diagnosis not present

## 2019-09-22 DIAGNOSIS — E119 Type 2 diabetes mellitus without complications: Secondary | ICD-10-CM | POA: Diagnosis not present

## 2019-09-22 DIAGNOSIS — E559 Vitamin D deficiency, unspecified: Secondary | ICD-10-CM | POA: Diagnosis not present

## 2019-09-22 DIAGNOSIS — Z5329 Procedure and treatment not carried out because of patient's decision for other reasons: Secondary | ICD-10-CM

## 2019-09-22 DIAGNOSIS — R42 Dizziness and giddiness: Secondary | ICD-10-CM | POA: Diagnosis not present

## 2019-09-22 DIAGNOSIS — E7800 Pure hypercholesterolemia, unspecified: Secondary | ICD-10-CM | POA: Diagnosis not present

## 2019-09-22 NOTE — Progress Notes (Signed)
When writer called patient at his appointment time he reported that he wanted to cancel.  I have sent a message to the coordinator as well as front desk.

## 2019-09-24 ENCOUNTER — Other Ambulatory Visit: Payer: Self-pay

## 2019-09-24 ENCOUNTER — Emergency Department: Payer: Medicare Other

## 2019-09-24 ENCOUNTER — Emergency Department
Admission: EM | Admit: 2019-09-24 | Discharge: 2019-09-25 | Disposition: A | Discharge status: Home / Self Care | Payer: Medicare Other | Attending: Emergency Medicine | Admitting: Emergency Medicine

## 2019-09-24 DIAGNOSIS — Z87891 Personal history of nicotine dependence: Secondary | ICD-10-CM | POA: Insufficient documentation

## 2019-09-24 DIAGNOSIS — I10 Essential (primary) hypertension: Secondary | ICD-10-CM | POA: Insufficient documentation

## 2019-09-24 DIAGNOSIS — E119 Type 2 diabetes mellitus without complications: Secondary | ICD-10-CM | POA: Insufficient documentation

## 2019-09-24 DIAGNOSIS — Z79899 Other long term (current) drug therapy: Secondary | ICD-10-CM | POA: Insufficient documentation

## 2019-09-24 DIAGNOSIS — R519 Headache, unspecified: Secondary | ICD-10-CM | POA: Insufficient documentation

## 2019-09-24 DIAGNOSIS — R42 Dizziness and giddiness: Secondary | ICD-10-CM | POA: Insufficient documentation

## 2019-09-24 LAB — CBC
HCT: 47 % (ref 39.0–52.0)
Hemoglobin: 16.1 g/dL (ref 13.0–17.0)
MCH: 28.3 pg (ref 26.0–34.0)
MCHC: 34.3 g/dL (ref 30.0–36.0)
MCV: 82.6 fL (ref 80.0–100.0)
Platelets: 245 10*3/uL (ref 150–400)
RBC: 5.69 MIL/uL (ref 4.22–5.81)
RDW: 13.3 % (ref 11.5–15.5)
WBC: 7.7 10*3/uL (ref 4.0–10.5)
nRBC: 0 % (ref 0.0–0.2)

## 2019-09-24 LAB — URINALYSIS, COMPLETE (UACMP) WITH MICROSCOPIC
Bacteria, UA: NONE SEEN
Bilirubin Urine: NEGATIVE
Glucose, UA: 150 mg/dL — AB
Hgb urine dipstick: NEGATIVE
Ketones, ur: NEGATIVE mg/dL
Leukocytes,Ua: NEGATIVE
Nitrite: NEGATIVE
Protein, ur: NEGATIVE mg/dL
Specific Gravity, Urine: 1.021 (ref 1.005–1.030)
pH: 7 (ref 5.0–8.0)

## 2019-09-24 LAB — BASIC METABOLIC PANEL
Anion gap: 12 (ref 5–15)
BUN: 15 mg/dL (ref 8–23)
CO2: 25 mmol/L (ref 22–32)
Calcium: 9.7 mg/dL (ref 8.9–10.3)
Chloride: 98 mmol/L (ref 98–111)
Creatinine, Ser: 0.99 mg/dL (ref 0.61–1.24)
GFR calc Af Amer: >60 mL/min (ref >60)
GFR calc non Af Amer: >60 mL/min (ref >60)
Glucose, Bld: 309 mg/dL — ABNORMAL HIGH (ref 70–99)
Potassium: 3.1 mmol/L — ABNORMAL LOW (ref 3.5–5.1)
Sodium: 135 mmol/L (ref 135–145)

## 2019-09-24 MED ORDER — DIAZEPAM 2 MG PO TABS
2.0000 mg | ORAL_TABLET | Freq: Once | ORAL | Status: AC
  Administered 2019-09-24: 2 mg via ORAL
  Filled 2019-09-24: qty 1

## 2019-09-24 NOTE — ED Triage Notes (Signed)
Pt c/o headache and "dizzy" spells with movement X 1 week. Pt reports hx of vertigo but states this feels different. Pt had been taking meclizine at home without relief. Pt alert and oriented X4, cooperative, RR even and unlabored, color WNL. Pt in NAD.

## 2019-09-24 NOTE — ED Provider Notes (Signed)
[facility] Emergency Department Provider Note   ____________________________________________   I have reviewed the triage vital signs and the nursing notes.   HISTORY  Chief Complaint Dizziness   History limited by: Not Limited   HPI Howard Murphy is a 64 y.o. male who presents to the emergency department today because of concern for dizziness. The patient states that he has a history of vertigo. For the past week however he has been having worsening dizziness. Describes it as the room spinning around.  This has been accompanied by headache.  Headache is located in the left side of his head.  When the dizziness happens it only lasts a couple of minutes.  Does seem to be worse with movement.  Patient however states that it does not remind him exactly of his previous episodes of vertigo.  He has been trying his meclizine at home without any relief.  He denies any recent trauma to his head.  Denies any recent illness or infection.   Records reviewed. Per medical record review patient has a history of HTN, HLD.   Past Medical History:  Diagnosis Date  . 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  . Back ache 05/05/2013  . Diabetes mellitus without complication ([place])   . GERD (gastroesophageal reflux disease)   . Headache   . History of kidney stones   . Hyperlipemia   . Hypertension   . Kidney stones   . Kidney stones   . Nephrolithiasis   . Shortness of breath dyspnea     Patient Active Problem List   Diagnosis Date Noted  . Osteoarthritis involving multiple joints 02/23/2019  . Chronic musculoskeletal pain 02/23/2019  . Insomnia secondary to chronic pain 02/23/2019  . Neurogenic claudication 04/02/2018  . Chronic bilateral low back pain with bilateral sciatica 03/05/2018  . Spondylosis without myelopathy or radiculopathy, lumbosacral region 01/14/2018  . Other specified dorsopathies, sacral and sacrococcygeal region 12/26/2017  . Acute appendicitis  12/18/2017  . Medication monitoring encounter 03/07/2017  . 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 Spondylolisthesis at L3-L4 level 08/03/2016  . Chronic pain syndrome 07/09/2016  . Encounter for medication monitoring 02/24/2016  . Cervical spondylosis 12/17/2015  . Cervical facet arthropathy 12/17/2015  . Chronic neck pain (Bilateral) (R>L) 12/17/2015  . Cervical facet syndrome (Bilateral) (R>L) 12/17/2015  . Chronic upper extremity pain (Bilateral) (R>L) 12/17/2015  . Cervical foraminal stenosis 12/17/2015  .  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 Arthropathy of shoulder (Right) 12/17/2015  . 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 Lumbar foraminal stenosis (L4-5) (Left) 12/17/2015  . Grade 1 Anterolisthesis (2 mm) of L4 over L5. 12/17/2015  . Lumbar facet (L4-5) synovial cyst (6 mm) (Left) 12/17/2015  . Chronic sacroiliac joint pain (Bilateral) (L>R) 12/08/2015  . Neurogenic pain 12/08/2015  . Abnormal MRI, lumbar spine 11/21/2015  . Radicular pain of shoulder (Right) 10/03/2015  . Carpal tunnel syndrome (Bilateral) 10/03/2015  . Chronic cervical radicular pain (Right) 10/03/2015  . Chronic low back pain (1ry area of Pain) (Bilateral) (L>R) w/o sciatica 09/06/2015  . Opiate use (60 MME/Day) 09/05/2015  . Long term prescription opiate use 09/05/2015  . Long term current use of opiate analgesic 09/05/2015  . Spondylosis of lumbar spine 09/05/2015  . Lumbar facet joint syndrome ([place]) 09/05/2015  . Chronic hip pain (Left) 09/05/2015  . Diabetic peripheral neuropathy ([place]) 09/05/2015  . Chronic lumbar radicular pain (  S1 Dermatome) (Bilateral) (L>R) 09/05/2015  . Hypokalemia 09/05/2015  . Chronic shoulder pain (2ry area of Pain) (Bilateral) (L>R) 09/05/2015  . Diabetes ([place]) 09/05/2015  . Chronic foot pain (3ry area of Pain) (Bilateral) (L>R) 09/05/2015  . Chronic  hand pain (4th area of Pain) (Bilateral) (R>L) 09/05/2015  . Lumbar facet hypertrophy 09/05/2015  . Encounter for therapeutic drug level monitoring 09/05/2015  . Low testosterone 09/05/2015  . Occipital pain (Right) 09/05/2015  . 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 Hypertriglyceridemia 07/26/2015  . Chronic abdominal pain (RUQ) 07/14/2015  . Type 2 diabetes mellitus with peripheral neuropathy ([place]) 07/14/2015  . Chest pain 07/14/2015  . Complete tear of the shoulder rotator cuff (Left) 05/10/2015  . Complete tear of left rotator cuff 05/10/2015  . SOB (shortness of breath) 03/22/2015  . Elevated hemoglobin ([place]) 07/07/2013  . History of lumbar facet Synovial cyst, surgically removed. 05/29/2013    Class: History of  . 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 GERD (gastroesophageal reflux disease) 05/05/2013  . Benign prostatic hyperplasia 05/05/2013  . Hypertension 05/08/2011  . Hyperlipidemia 05/08/2011    Past Surgical History:  Procedure Laterality Date  . APPENDECTOMY    . BACK SURGERY  2015   x3  . BACK SURGERY  08/09/2016  . BACK SURGERY Bilateral 2019  . CHOLECYSTECTOMY  2000  . COLONOSCOPY WITH PROPOFOL N/A 04/11/2015   Procedure: COLONOSCOPY WITH PROPOFOL;  Surgeon: Manya Silvas, MD;  Location: [facility] ENDOSCOPY;  Service: Endoscopy;  Laterality: N/A;  . CYSTOSCOPY WITH URETEROSCOPY AND STENT PLACEMENT Right 08/27/2017   Procedure: CYSTOSCOPY WITH URETEROSCOPY AND STENT PLACEMENT;  Surgeon: Abbie Sons, MD;  Location: [facility] ORS;  Service: Urology;  Laterality: Right;  . ESOPHAGOGASTRODUODENOSCOPY (EGD) WITH PROPOFOL N/A 04/11/2015   Procedure: ESOPHAGOGASTRODUODENOSCOPY (EGD) WITH PROPOFOL;  Surgeon: Manya Silvas, MD;  Location: [facility] ENDOSCOPY;  Service: Endoscopy;  Laterality: N/A;  . LAPAROSCOPIC APPENDECTOMY N/A 12/18/2017   Procedure: APPENDECTOMY LAPAROSCOPIC;  Surgeon: Florene Glen, MD;  Location: [facility] ORS;  Service: General;  Laterality:  N/A;  . SHOULDER SURGERY Right 2016  . SHOULDER SURGERY Left 06/2015    Prior to Admission medications   Medication Sig Start Date End Date Taking? Authorizing Provider  albuterol (PROVENTIL HFA;VENTOLIN HFA) 108 (90 Base) MCG/ACT inhaler Inhale 2 puffs into the lungs every 6 (six) hours as needed for wheezing or shortness of breath. 05/29/17   Fisher, Linden Dolin, PA-C  atorvastatin (LIPITOR) 40 MG tablet Take 1 tablet (40 mg total) by mouth at bedtime. 03/08/17   Arnetha Courser, MD  canagliflozin (INVOKANA) 100 MG TABS tablet Take 100 mg by mouth daily before breakfast.    [provider]  chlorthalidone (HYGROTON) 50 MG tablet Take 50 mg by mouth daily.    [provider]  cholecalciferol (VITAMIN D) 25 MCG (1000 UT) tablet Take 1 tablet by mouth daily. 02/26/19   [provider]  gabapentin (NEURONTIN) 800 MG tablet Take 1,600 mg by mouth 2 (two) times daily. 03/25/19   [provider]  hydrOXYzine (ATARAX/VISTARIL) 25 MG tablet Take 1 tablet (25 mg total) by mouth 3 (three) times daily as needed for anxiety. 02/15/19   Vanessa [place], MD  lisinopril (ZESTRIL) 20 MG tablet Take 20 mg by mouth daily. 09/18/18   [provider]  meclizine (ANTIVERT) 25 MG tablet Take 25 mg by mouth every 6 (six) hours as needed for dizziness.    [provider]  Melatonin 10 MG CAPS  Take 20 mg by mouth at bedtime. 08/29/19 02/25/20  Milinda Pointer, MD  ondansetron (ZOFRAN ODT) 4 MG disintegrating tablet Take 1 tablet (4 mg total) by mouth every 8 (eight) hours as needed for nausea or vomiting. 12/29/17   Darel Hong, MD  Oxycodone HCl 10 MG TABS Take 1 tablet (10 mg total) by mouth every 8 (eight) hours. Must last 30 days. 09/03/19 10/03/19  Milinda Pointer, MD  Oxycodone HCl 10 MG TABS Take 1 tablet (10 mg total) by mouth every 8 (eight) hours. Must last 30 days. 10/03/19 11/02/19  Milinda Pointer, MD  Oxycodone HCl 10 MG TABS Take 1 tablet (10 mg total) by  mouth every 8 (eight) hours. Must last 30 days. 11/02/19 12/02/19  Milinda Pointer, MD  potassium chloride (K-DUR) 10 MEQ tablet Take 1 tablet (10 mEq total) by mouth daily. Patient taking differently: Take 10 mEq by mouth daily as needed (For low potassium when instructred by his doctor.).  08/23/17   Stoioff, Ronda Fairly, MD  tamsulosin (FLOMAX) 0.4 MG CAPS capsule Take 1 capsule (0.4 mg total) by mouth daily. 11/04/18   Billey Co, MD  testosterone cypionate (DEPOTESTOSTERONE CYPIONATE) 200 MG/ML injection Inject 200 mg into the muscle every 14 (fourteen) days. 10/07/18   [provider]  tiZANidine (ZANAFLEX) 4 MG tablet Take 1 tablet (4 mg total) by mouth every 8 (eight) hours as needed for muscle spasms. 08/29/19 02/25/20  Milinda Pointer, MD    Allergies Adhesive [tape]  Family History  Problem Relation Age of Onset  . Cancer Mother        Breast CA  . Stroke Father   . Heart disease Father   . Cancer Sister   . Cancer Maternal Grandmother   . Cancer Sister   . Bladder Cancer Neg Hx   . Prostate cancer Neg Hx   . Kidney cancer Neg Hx     Social History Social History   Tobacco Use  . Smoking status: Never Smoker  . Smokeless tobacco: Former Systems developer    Types: Chew  Substance Use Topics  . Alcohol use: No  . Drug use: No    Review of Systems Constitutional: No fever/chills Eyes: Positive for room spinning, some blurriness.  ENT: No sore throat. Cardiovascular: Denies chest pain. Respiratory: Denies shortness of breath. Gastrointestinal: No abdominal pain.  No nausea, no vomiting.  No diarrhea.   Genitourinary: Negative for dysuria. Musculoskeletal: Negative for back pain. Skin: Negative for rash. Neurological: Positive for dizziness. Positive for headache. ____________________________________________   PHYSICAL EXAM:  VITAL SIGNS: ED Triage Vitals [09/24/19 1731]  Enc Vitals Group     BP 108/78     Pulse Rate (!) 103     Resp 18     Temp 98.1 F  (36.7 C)     Temp Source Oral     SpO2 100 %     Weight 185 lb (83.9 kg)     Height 5\' 7"  (1.702 m)     Head Circumference      Peak Flow      Pain Score 8   Constitutional: Alert and oriented.  Eyes: Conjunctivae are normal.  ENT      Head: Normocephalic and atraumatic.      Nose: No congestion/rhinnorhea.      Mouth/Throat: Mucous membranes are moist.      Neck: No stridor. Hematological/Lymphatic/Immunilogical: No cervical lymphadenopathy. Cardiovascular: Normal rate, regular rhythm.  No murmurs, rubs, or gallops.  Respiratory: Normal respiratory effort without  tachypnea nor retractions. Breath sounds are clear and equal bilaterally. No wheezes/rales/rhonchi. Gastrointestinal: Soft and non tender. No rebound. No guarding.  Genitourinary: Deferred Musculoskeletal: Normal range of motion in all extremities. No lower extremity edema. Neurologic:  Normal speech and language. PERRL. EOMI. Strength 5/5 in upper and lower extremities. Sensation intact.  Skin:  Skin is warm, dry and intact. No rash noted. Psychiatric: Mood and affect are normal. Speech and behavior are normal. Patient exhibits appropriate insight and judgment.  ____________________________________________    LABS (pertinent positives/negatives)  CBC wbc 7.7, hgb 16.1, plt 245 BMP wnl except k 3.1, glu 309 UA clear, 0-5 rbc and wbc  ____________________________________________   EKG  I, Nance Pear, attending physician, personally viewed and interpreted this EKG  EKG Time: 1740 Rate: 101 Rhythm: sinus tachycardia Axis: left axis deviation Intervals: qtc 459 QRS: narrow, q waves III ST changes: no st elevation Impression: abnormal ekg  ____________________________________________    RADIOLOGY  CT head No acute abnormality  ____________________________________________   PROCEDURES  Procedures  ____________________________________________   INITIAL IMPRESSION / ASSESSMENT AND PLAN / ED  COURSE  Pertinent labs & imaging results that were available during my care of the patient were reviewed by me and considered in my medical decision making (see chart for details).   Patient presented to the emergency department today because of concerns for dizziness and the sensation of the room spinning.  States he has a history of vertigo but this feels different.  Also having accompanying headache.  Patient did have CT head done without acute abnormality.  He was given Valium however he continued to have symptoms.  Given continued symptoms will check MRI to evaluate for possible stroke.  ____________________________________________   FINAL CLINICAL IMPRESSION(S) / ED DIAGNOSES  Final diagnoses:  Dizziness  Bad headache     Note: This dictation was prepared with Dragon dictation. Any transcriptional errors that result from this process are unintentional     Nance Pear, MD 09/24/19 2257

## 2019-09-24 NOTE — ED Notes (Signed)
Patient transported to CT 

## 2019-09-24 NOTE — ED Notes (Signed)
Patient is waiting for MRI, visitor at bedside

## 2019-09-24 NOTE — ED Notes (Signed)
Patient says he's not feeling any better

## 2019-09-24 NOTE — ED Notes (Signed)
Patient currently speaking with MRI

## 2019-09-25 ENCOUNTER — Emergency Department: Payer: Medicare Other

## 2019-09-25 DIAGNOSIS — R42 Dizziness and giddiness: Secondary | ICD-10-CM | POA: Diagnosis not present

## 2019-09-25 DIAGNOSIS — R519 Headache, unspecified: Secondary | ICD-10-CM | POA: Diagnosis not present

## 2019-09-25 MED ORDER — LORAZEPAM 2 MG/ML IJ SOLN
INTRAMUSCULAR | Status: AC
  Filled 2019-09-25: qty 1

## 2019-09-25 MED ORDER — LORAZEPAM 2 MG/ML IJ SOLN
2.0000 mg | Freq: Once | INTRAMUSCULAR | Status: AC
  Administered 2019-09-25: 01:00:00 2 mg via INTRAMUSCULAR

## 2019-09-25 NOTE — Discharge Instructions (Signed)
Thank you for letting us take care of you in the emergency department today. Your MRI was negative for any acute abnormalities.   Please continue to take any regular, prescribed medications.   Please follow up with: Your primary care doctor to review your ER visit and follow up on your symptoms.   Please return to the ER for any new or worsening symptoms.

## 2019-09-25 NOTE — ED Notes (Signed)
Patient transported to MRI 

## 2019-09-25 NOTE — ED Notes (Signed)
Patient returned from MRI.

## 2019-09-25 NOTE — ED Provider Notes (Signed)
MRI with no acute abnormality. As such, patient stable for discharge as planned. Advised outpatient follow up and given return precautions.    Lilia Pro., MD 09/25/19 [phone]

## 2019-09-28 DIAGNOSIS — E1165 Type 2 diabetes mellitus with hyperglycemia: Secondary | ICD-10-CM | POA: Diagnosis not present

## 2019-09-28 DIAGNOSIS — F411 Generalized anxiety disorder: Secondary | ICD-10-CM | POA: Diagnosis not present

## 2019-11-04 DIAGNOSIS — F411 Generalized anxiety disorder: Secondary | ICD-10-CM | POA: Diagnosis not present

## 2019-11-04 DIAGNOSIS — E1165 Type 2 diabetes mellitus with hyperglycemia: Secondary | ICD-10-CM | POA: Diagnosis not present

## 2019-11-05 DIAGNOSIS — K429 Umbilical hernia without obstruction or gangrene: Secondary | ICD-10-CM | POA: Diagnosis not present

## 2019-11-24 NOTE — Progress Notes (Signed)
Unsuccessful attempt to contact patient for Virtual Visit (Pain Management Telehealth)   Patient provided contact information:  [phone] (home); [phone] (mobile); (Preferred) [phone] No e-mail address on record   Pre-screening:  Our staff was unsuccessful in contacting Mr. Cheatum using the above provided information.   I unsuccessfully attempted to make contact with [name] on 11/25/2019 via telephone. I was unable to complete the virtual encounter due to the contact number not being in service. I was unable to leave a message.  Pharmacotherapy Assessment  Analgesic: Oxycodone IR 10 mg 1 tab PO q 8 hrs (30 mg/day of oxycodone) MME/day:45mg /day.   Follow-up plan:   Reschedule Visit.     Interventional management options: Planned, scheduled, and/or pending:      Considering:   Diagnostic bilateral cervical facet block Possible bilateral cervical facet RFA  Diagnostic right greater occipital NB Possible right GON RFA  Possible right GON peripheral nerve stimulator trial  Diagnostic right CESI  Diagnostic bilateral IA shoulder injection Diagnostic bilateral suprascapular NB Possible bilateral suprascapular nerve RFA  Diagnostic bilateral SI joint block Possible bilateral SI joint RFA  Diagnostic bilateral lumbar facet block Possible bilateral lumbar facet RFA  Diagnostic left L4 TFESI  Diagnostic left L4-5 LESI  Diagnostic left IA hip joint injection Possible left hip joint RFA  Diagnostic bilateral median nerve block(carpal tunnel block)  Diagnostic right CPB   Palliative PRN treatment(s):   Palliative/Diagnostic left SI Block (Area of L5 Transverse process spatulation) #2  Palliative IM Toradol/Norflex 60/60 (PRN)      Recent Visits No visits were found meeting these conditions. Showing recent visits within past 90 days and meeting all other requirements Today's Visits Date Type Provider Dept  11/25/19 Telemedicine Milinda Pointer,  MD [facility]  Showing today's visits and meeting all other requirements Future Appointments No visits were found meeting these conditions. Showing future appointments within next 90 days and meeting all other requirements   Note by: Gaspar Cola, MD Date: 11/25/2019; Time: 1:37 PM

## 2019-11-25 ENCOUNTER — Ambulatory Visit: Payer: Medicare Other | Attending: Pain Medicine | Admitting: Pain Medicine

## 2019-11-25 ENCOUNTER — Other Ambulatory Visit: Payer: Self-pay

## 2019-11-25 DIAGNOSIS — M899 Disorder of bone, unspecified: Secondary | ICD-10-CM | POA: Insufficient documentation

## 2019-11-25 DIAGNOSIS — G894 Chronic pain syndrome: Secondary | ICD-10-CM

## 2019-11-25 DIAGNOSIS — Z79899 Other long term (current) drug therapy: Secondary | ICD-10-CM | POA: Insufficient documentation

## 2019-11-25 DIAGNOSIS — Z789 Other specified health status: Secondary | ICD-10-CM | POA: Insufficient documentation

## 2019-12-02 ENCOUNTER — Telehealth: Payer: Self-pay | Admitting: Pain Medicine

## 2019-12-02 NOTE — Telephone Encounter (Signed)
Called patient to let him know that I have entered the correct phone number and have sent the message to staffing to try and get him in today or tomorrow for medication management.

## 2019-12-02 NOTE — Telephone Encounter (Signed)
The girlfriend called back with the number to call. [phone]

## 2019-12-02 NOTE — Telephone Encounter (Signed)
I attempted the number on file 3 times and have been unsuccessful due to "not in service". Need to discuss with patient. Last visit I see is in March. The one in June looks like Dr. Dossie Arbour was unable to reach. We did receive the UDS today.

## 2019-12-02 NOTE — Telephone Encounter (Signed)
Patient came in today to complete his UDS. He is also out of meds today. He had an appt., but no medications were written. Please check with DR. Dossie Arbour to see if he will write meds for this patient

## 2019-12-03 ENCOUNTER — Ambulatory Visit: Payer: Medicare Other | Attending: Pain Medicine | Admitting: Pain Medicine

## 2019-12-03 ENCOUNTER — Other Ambulatory Visit: Payer: Self-pay

## 2019-12-03 ENCOUNTER — Encounter: Payer: Self-pay | Admitting: Pain Medicine

## 2019-12-03 VITALS — BP 119/84 | HR 83 | Temp 97.0°F | Resp 18 | Ht 67.0 in | Wt 190.0 lb

## 2019-12-03 DIAGNOSIS — G8929 Other chronic pain: Secondary | ICD-10-CM | POA: Diagnosis present

## 2019-12-03 DIAGNOSIS — G894 Chronic pain syndrome: Secondary | ICD-10-CM

## 2019-12-03 DIAGNOSIS — M79641 Pain in right hand: Secondary | ICD-10-CM

## 2019-12-03 DIAGNOSIS — M25511 Pain in right shoulder: Secondary | ICD-10-CM

## 2019-12-03 DIAGNOSIS — Z79899 Other long term (current) drug therapy: Secondary | ICD-10-CM

## 2019-12-03 DIAGNOSIS — M79642 Pain in left hand: Secondary | ICD-10-CM | POA: Diagnosis present

## 2019-12-03 DIAGNOSIS — M545 Low back pain, unspecified: Secondary | ICD-10-CM

## 2019-12-03 DIAGNOSIS — G5793 Unspecified mononeuropathy of bilateral lower limbs: Secondary | ICD-10-CM

## 2019-12-03 DIAGNOSIS — M25512 Pain in left shoulder: Secondary | ICD-10-CM | POA: Diagnosis present

## 2019-12-03 MED ORDER — OXYCODONE HCL 10 MG PO TABS: 10.0000 mg | ORAL_TABLET | Freq: Three times a day (TID) | ORAL | 0 refills | Status: DC

## 2019-12-03 NOTE — Patient Instructions (Signed)

## 2019-12-03 NOTE — Progress Notes (Signed)
PROVIDER NOTE: Information contained herein reflects review and annotations entered in association with encounter. Interpretation of such information and data should be left to medically-trained personnel. Information provided to patient can be located elsewhere in the medical record under "Patient Instructions". Document created using STT-dictation technology, any transcriptional errors that may result from process are unintentional.    Patient: Howard Murphy  Service Category: E/M  Provider: Gaspar Cola, MD  DOB: 1956/04/20  DOS: 12/03/2019  Specialty: Interventional Pain Management  MRN: [id number]  Setting: Ambulatory outpatient  PCP: [facility]  Type: Established Patient    Referring Provider: [facility] [place]*  Location: Office  Delivery: Face-to-face     HPI  Reason for encounter: [name], a 64 y.o. year old male, is here today for evaluation and management of his Chronic pain syndrome [G89.4]. Howard Murphy primary complain today is Back Pain (low) Last encounter: Practice (12/02/2019). My last encounter with him was on 12/02/2019. Pertinent problems: Howard Murphy has History of lumbar facet Synovial cyst, surgically removed.; Chronic abdominal pain (RUQ); Type 2 diabetes mellitus with peripheral neuropathy ([place]); Spondylosis of lumbar spine; Lumbar facet joint syndrome ([place]); Chronic hip pain (Left); Diabetic peripheral neuropathy ([place]); Chronic lumbar radicular pain (S1 Dermatome) (Bilateral) (L>R); Complete tear of the shoulder rotator cuff (Left); Chronic shoulder pain (2ry area of Pain) (Bilateral) (L>R); Chronic foot pain (3ry area of Pain) (Bilateral) (L>R); Chronic hand pain (4th area of Pain) (Bilateral) (R>L); Lumbar facet hypertrophy; Occipital pain (Right); Failed back surgical syndrome 4; Chronic low back pain (1ry area of Pain) (Bilateral) (L>R) w/o sciatica; Radicular pain of shoulder (Right); Carpal tunnel syndrome (Bilateral); Chronic  cervical radicular pain (Right); Abnormal MRI, lumbar spine; Chronic sacroiliac joint pain (Bilateral) (L>R); Neurogenic pain; Cervical spondylosis; Cervical facet arthropathy; Chronic neck pain (Bilateral) (R>L); Cervical facet syndrome (Bilateral) (R>L); Chronic upper extremity pain (Bilateral) (R>L); Cervical foraminal stenosis;  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, C5-6); Arthropathy of shoulder (Right); 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); Lumbar foraminal stenosis (L4-5) (Left); Grade 1 Anterolisthesis (2 mm) of L4 over L5.; Lumbar facet (L4-5) synovial cyst (6 mm) (Left); Chronic pain syndrome; Spondylolisthesis at L3-L4 level; Complete tear of left rotator cuff; Bertolotti's syndrome (L5-S1) (Bilateral); 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 Pain Assessment: Severity of Chronic pain is reported as a 8 /10. Location: Back Lower/radiates across low back. Onset: More than a month ago. Quality: Numbness, Pins and needles. Timing: Constant. Modifying factor(s): denies. Vitals:  height is '5\' 7"'  (1.702 m) and weight is 190 lb (86.2 kg). His temperature is 97 F (36.1 C) (abnormal). His blood pressure is 119/84 and his pulse is 83. His respiration is 18 and oxygen saturation is 97%.   The patient showed up to the office today 40 minutes after his appointment and did not bring any of his pills to be counted.  He also indicates having come by the office about 2 days ago and left a urine specimen for evaluation.  Today I have given him a final warning regarding medication will compliance and I have also explained to him that we cannot keep doing this in terms of his  tardiness.  Today I caught him some slack and I did see him for this appointment, but are rules are that  if the patient does not check in 15 minutes prior to the appointment, they may be canceled and will need to be rescheduled.  Pharmacotherapy Assessment   Analgesic: Oxycodone IR 10 mg 1 tab PO q 8 hrs (30 mg/day of oxycodone) MME/day:66m/day.   Monitoring: [place] PMP: PDMP not reviewed this encounter.       Pharmacotherapy: No side-effects or adverse reactions reported. Compliance: No problems identified. Effectiveness: Clinically acceptable.  TDewayne Shorter RN  12/03/2019  1:50 PM  Signed Nursing Pain Medication Assessment:  Safety precautions to be maintained throughout the outpatient stay will include: orient to surroundings, keep bed in low position, maintain call bell within reach at all times, provide assistance with transfer out of bed and ambulation.  Medication Inspection Compliance: Mr. FSartindid not comply with our request to bring his pills to be counted. He was reminded that bringing the medication bottles, even when empty, is a requirement.  Medication: None brought in. Pill/Patch Count: None available to be counted. Bottle Appearance: No container available. Did not bring bottle(s) to appointment. Filled Date: N/A Last Medication intake:  Today  Reminded to bring to every appointment    UDS:  Summary  Date Value Ref Range Status  08/06/2018 FINAL  Final    Comment:    ==================================================================== TOXASSURE SELECT 13 (MW) ==================================================================== Test                             Result       Flag       Units Drug Present and Declared for Prescription Verification   Oxycodone                      1457         EXPECTED   ng/mg creat   Oxymorphone                    918          EXPECTED   ng/mg creat   Noroxycodone                   2805         EXPECTED   ng/mg creat    Noroxymorphone                 480          EXPECTED   ng/mg creat    Sources of oxycodone are scheduled prescription medications.    Oxymorphone, noroxycodone, and noroxymorphone are expected    metabolites of oxycodone. Oxymorphone is also available as a    scheduled prescription medication. ==================================================================== Test                      Result    Flag   Units      Ref Range   Creatinine              202              mg/dL      >=20 ==================================================================== Declared Medications:  The flagging and interpretation on this report are based on the  following declared medications.  Unexpected results may arise from  inaccuracies in the declared medications.  **Note: The testing scope of this panel includes these medications:  Oxycodone (Percocet)  **Note: The testing scope of this panel does not include following  reported medications:  Acetaminophen  Acetaminophen (  Percocet)  Albuterol  Atorvastatin  Celecoxib  Chlorthalidone  Gabapentin (Neurontin)  Meclizine (Antivert)  Methocarbamol (Robaxin)  Ondansetron (Zofran)  Potassium  Ranitidine  Sitagliptin ==================================================================== For clinical consultation, please call [phone]. ====================================================================      ROS  Constitutional: Denies any fever or chills Gastrointestinal: No reported hemesis, hematochezia, vomiting, or acute GI distress Musculoskeletal: Denies any acute onset joint swelling, redness, loss of ROM, or weakness Neurological: No reported episodes of acute onset apraxia, aphasia, dysarthria, agnosia, amnesia, paralysis, loss of coordination, or loss of consciousness  Medication Review  Melatonin, Oxycodone HCl, albuterol, atorvastatin, canagliflozin, chlorthalidone, cholecalciferol, gabapentin, hydrOXYzine, lisinopril, meclizine,  ondansetron, potassium chloride, tamsulosin, testosterone cypionate, and tiZANidine  History Review  Allergy: Mr. Delbuono is allergic to adhesive [tape]. Drug: Mr. Blagg  reports no history of drug use. Alcohol:  reports no history of alcohol use. Tobacco:  reports that he has never smoked. He quit smokeless tobacco use about 6 years ago.  His smokeless tobacco use included chew. Social: Mr. Takagi  reports that he has never smoked. He quit smokeless tobacco use about 6 years ago.  His smokeless tobacco use included chew. He reports that he does not drink alcohol and does not use drugs. Medical:  has a past medical history of Arthritis, Back ache (05/05/2013), Diabetes mellitus without complication ([place]), GERD (gastroesophageal reflux disease), Headache, History of kidney stones, Hyperlipemia, Hypertension, Kidney stones, Kidney stones, Nephrolithiasis, and Shortness of breath dyspnea. Surgical: Mr. Spanos  has a past surgical history that includes Cholecystectomy (2000); Shoulder surgery (Right, 2016); Colonoscopy with propofol (N/A, 04/11/2015); Esophagogastroduodenoscopy (egd) with propofol (N/A, 04/11/2015); Shoulder surgery (Left, 06/2015); Back surgery (2015); Back surgery (08/09/2016); Cystoscopy with ureteroscopy and stent placement (Right, 08/27/2017); laparoscopic appendectomy (N/A, 12/18/2017); Appendectomy; and Back surgery (Bilateral, 2019). Family: family history includes Cancer in his maternal grandmother, mother, sister, and sister; Heart disease in his father; Stroke in his father.  Laboratory Chemistry Profile   Renal Lab Results  Component Value Date   BUN 15 09/24/2019   CREATININE 0.99 09/24/2019   LABCREA 182 02/24/2016   BCR 14 03/07/2017   GFRAA >60 09/24/2019   GFRNONAA >60 09/24/2019     Hepatic Lab Results  Component Value Date   AST 36 02/15/2019   ALT 32 02/15/2019   ALBUMIN 4.1 02/15/2019   ALKPHOS 45 02/15/2019   LIPASE 46 12/03/2018      Electrolytes Lab Results  Component Value Date   NA 135 09/24/2019   K 3.1 (L) 09/24/2019   CL 98 09/24/2019   CALCIUM 9.7 09/24/2019   MG 2.0 11/29/2016     Bone Lab Results  Component Value Date   25OHVITD1 24 (L) 11/29/2016   25OHVITD2 <1.0 11/29/2016   25OHVITD3 24 11/29/2016     Inflammation (CRP: Acute Phase) (ESR: Chronic Phase) Lab Results  Component Value Date   CRP 1.8 11/29/2016   ESRSEDRATE 11 11/29/2016       Note: Above Lab results reviewed.  Recent Imaging Review     MR BRAIN WO CONTRAST CLINICAL DATA:  Dizziness and headache  EXAM: MRI HEAD WITHOUT CONTRAST  TECHNIQUE: Multiplanar, multiecho pulse sequences of the brain and surrounding structures were obtained without intravenous contrast.  COMPARISON:  Head CT 09/24/2019  FINDINGS: Brain: No acute infarct, acute hemorrhage or extra-axial collection. Mild white matter hyperintensity, most commonly due to chronic ischemic microangiopathy, though not unexpected for age. Normal volume of CSF spaces. No chronic microhemorrhage. Normal midline structures.  Vascular: Normal flow voids.  Skull  and upper cervical spine: Normal marrow signal.  Sinuses/Orbits: Negative.  Other: None  IMPRESSION: 1. No acute intracranial abnormality. 2. Mild white matter hyperintensity, most commonly due to chronic microvascular disease, though not unexpected for age.  Electronically Signed   By: Ulyses Jarred M.D.   On: 09/25/2019 01:52 Note: Reviewed        Physical Exam  General appearance: Well nourished, well developed, and well hydrated. In no apparent acute distress Mental status: Alert, oriented x 3 (person, place, & time)       Respiratory: No evidence of acute respiratory distress Eyes: PERLA Vitals: BP 119/84    Pulse 83    Temp (!) 97 F (36.1 C)    Resp 18    Ht '5\' 7"'  (1.702 m)    Wt 190 lb (86.2 kg)    SpO2 97%    BMI 29.76 kg/m  BMI: Estimated body mass index is 29.76 kg/m as calculated  from the following:   Height as of this encounter: '5\' 7"'  (1.702 m).   Weight as of this encounter: 190 lb (86.2 kg). Ideal: Ideal body weight: 66.1 kg (145 lb 11.6 oz) Adjusted ideal body weight: 74.1 kg (163 lb 6.9 oz)  Assessment   Status Diagnosis  Controlled Controlled Controlled 1. Chronic pain syndrome   2. Chronic low back pain (1ry area of Pain) (Bilateral) (L>R) w/o sciatica   3. Chronic shoulder pain (2ry area of Pain) (Bilateral) (L>R)   4. Chronic foot pain (3ry area of Pain) (Bilateral) (L>R)   5. Chronic hand pain (4th area of Pain) (Bilateral) (R>L)   6. Pharmacologic therapy      Updated Problems: No problems updated.  Plan of Care  Problem-specific:  No problem-specific Assessment & Plan notes found for this encounter.  Mr. NITISH ROES has a current medication list which includes the following long-term medication(s): albuterol, atorvastatin, chlorthalidone, melatonin, potassium chloride, oxycodone hcl, and tizanidine.  Pharmacotherapy (Medications Ordered): Meds ordered this encounter  Medications   Oxycodone HCl 10 MG TABS    Sig: Take 1 tablet (10 mg total) by mouth every 8 (eight) hours. Must last 30 days.    Dispense:  90 tablet    Refill:  0    Chronic Pain: STOP Act (Not applicable) Fill 1 day early if closed on refill date. Do not fill until: 12/03/2019. To last until: 01/02/2020. Avoid benzodiazepines within 8 hours of opioids   Orders:  Orders Placed This Encounter  Procedures   ToxASSURE Select 13 (MW), Urine    Volume: 30 ml(s). Minimum 3 ml of urine is needed. Document temperature of fresh sample. Indications: Long term (current) use of opiate analgesic (O17.711)    Order Specific Question:   Release to patient    Answer:   Immediate   Follow-up plan:   Return in about 27 days (around 12/30/2019) for (F2F), (MM) to evaluate UDS.      Interventional management options: Planned, scheduled, and/or pending:      Considering:    Diagnostic bilateral cervical facet block Possible bilateral cervical facet RFA  Diagnostic right greater occipital NB Possible right GON RFA  Possible right GON peripheral nerve stimulator trial  Diagnostic right CESI  Diagnostic bilateral IA shoulder injection Diagnostic bilateral suprascapular NB Possible bilateral suprascapular nerve RFA  Diagnostic bilateral SI joint block Possible bilateral SI joint RFA  Diagnostic bilateral lumbar facet block Possible bilateral lumbar facet RFA  Diagnostic left L4 TFESI  Diagnostic left L4-5 LESI  Diagnostic left  IA hip joint injection Possible left hip joint RFA  Diagnostic bilateral median nerve block(carpal tunnel block)  Diagnostic right CPB   Palliative PRN treatment(s):   Palliative/Diagnostic left SI Block (Area of L5 Transverse process spatulation) #2  Palliative IM Toradol/Norflex 60/60 (PRN)     Recent Visits No visits were found meeting these conditions. Showing recent visits within past 90 days and meeting all other requirements Today's Visits Date Type Provider Dept  12/03/19 Office Visit Milinda Pointer, MD [facility]  Showing today's visits and meeting all other requirements Future Appointments Date Type Provider Dept  12/30/19 Appointment Milinda Pointer, MD [facility]  02/22/20 Appointment Milinda Pointer, MD [facility]  Showing future appointments within next 90 days and meeting all other requirements  I discussed the assessment and treatment plan with the patient. The patient was provided an opportunity to ask questions and all were answered. The patient agreed with the plan and demonstrated an understanding of the instructions.  Patient advised to call back or seek an in-person evaluation if the symptoms or condition worsens.  Duration of encounter: 30 minutes.  Note by: Gaspar Cola, MD Date: 12/03/2019; Time: 2:19 PM

## 2019-12-03 NOTE — Progress Notes (Signed)
Nursing Pain Medication Assessment:  Safety precautions to be maintained throughout the outpatient stay will include: orient to surroundings, keep bed in low position, maintain call bell within reach at all times, provide assistance with transfer out of bed and ambulation.  Medication Inspection Compliance: Howard Murphy did not comply with our request to bring his pills to be counted. He was reminded that bringing the medication bottles, even when empty, is a requirement.  Medication: None brought in. Pill/Patch Count: None available to be counted. Bottle Appearance: No container available. Did not bring bottle(s) to appointment. Filled Date: N/A Last Medication intake:  Today  Reminded to bring to every appointment

## 2019-12-10 LAB — TOXASSURE SELECT 13 (MW), URINE

## 2019-12-22 ENCOUNTER — Other Ambulatory Visit: Payer: Self-pay

## 2019-12-22 ENCOUNTER — Emergency Department
Admission: EM | Admit: 2019-12-22 | Discharge: 2019-12-22 | Disposition: A | Discharge status: Home / Self Care | Payer: Medicare Other | Attending: Student in an Organized Health Care Education/Training Program | Admitting: Student in an Organized Health Care Education/Training Program

## 2019-12-22 ENCOUNTER — Emergency Department: Payer: Medicare Other

## 2019-12-22 DIAGNOSIS — Z9889 Other specified postprocedural states: Secondary | ICD-10-CM | POA: Insufficient documentation

## 2019-12-22 DIAGNOSIS — Z79899 Other long term (current) drug therapy: Secondary | ICD-10-CM | POA: Diagnosis not present

## 2019-12-22 DIAGNOSIS — E1165 Type 2 diabetes mellitus with hyperglycemia: Secondary | ICD-10-CM | POA: Diagnosis not present

## 2019-12-22 DIAGNOSIS — E1140 Type 2 diabetes mellitus with diabetic neuropathy, unspecified: Secondary | ICD-10-CM | POA: Diagnosis not present

## 2019-12-22 DIAGNOSIS — I700 Atherosclerosis of aorta: Secondary | ICD-10-CM | POA: Diagnosis not present

## 2019-12-22 DIAGNOSIS — M545 Low back pain, unspecified: Secondary | ICD-10-CM

## 2019-12-22 DIAGNOSIS — I10 Essential (primary) hypertension: Secondary | ICD-10-CM | POA: Diagnosis not present

## 2019-12-22 DIAGNOSIS — K4020 Bilateral inguinal hernia, without obstruction or gangrene, not specified as recurrent: Secondary | ICD-10-CM | POA: Diagnosis not present

## 2019-12-22 DIAGNOSIS — G8929 Other chronic pain: Secondary | ICD-10-CM | POA: Diagnosis not present

## 2019-12-22 DIAGNOSIS — N400 Enlarged prostate without lower urinary tract symptoms: Secondary | ICD-10-CM | POA: Diagnosis not present

## 2019-12-22 DIAGNOSIS — E785 Hyperlipidemia, unspecified: Secondary | ICD-10-CM | POA: Diagnosis not present

## 2019-12-22 LAB — URINALYSIS, COMPLETE (UACMP) WITH MICROSCOPIC
Bacteria, UA: NONE SEEN
Bilirubin Urine: NEGATIVE
Glucose, UA: >=500 mg/dL — AB
Hgb urine dipstick: NEGATIVE
Ketones, ur: NEGATIVE mg/dL
Leukocytes,Ua: NEGATIVE
Nitrite: NEGATIVE
Protein, ur: NEGATIVE mg/dL
Specific Gravity, Urine: 1.031 — ABNORMAL HIGH (ref 1.005–1.030)
pH: 5 (ref 5.0–8.0)

## 2019-12-22 LAB — CBC
HCT: 43.2 % (ref 39.0–52.0)
Hemoglobin: 14.8 g/dL (ref 13.0–17.0)
MCH: 29.5 pg (ref 26.0–34.0)
MCHC: 34.3 g/dL (ref 30.0–36.0)
MCV: 86.1 fL (ref 80.0–100.0)
Platelets: 322 10*3/uL (ref 150–400)
RBC: 5.02 MIL/uL (ref 4.22–5.81)
RDW: 13.3 % (ref 11.5–15.5)
WBC: 10.2 10*3/uL (ref 4.0–10.5)
nRBC: 0 % (ref 0.0–0.2)

## 2019-12-22 LAB — COMPREHENSIVE METABOLIC PANEL
ALT: 36 U/L (ref 0–44)
AST: 29 U/L (ref 15–41)
Albumin: 4.3 g/dL (ref 3.5–5.0)
Alkaline Phosphatase: 68 U/L (ref 38–126)
Anion gap: 11 (ref 5–15)
BUN: 16 mg/dL (ref 8–23)
CO2: 29 mmol/L (ref 22–32)
Calcium: 9.6 mg/dL (ref 8.9–10.3)
Chloride: 98 mmol/L (ref 98–111)
Creatinine, Ser: 1.36 mg/dL — ABNORMAL HIGH (ref 0.61–1.24)
GFR calc Af Amer: >60 mL/min (ref >60)
GFR calc non Af Amer: 55 mL/min — ABNORMAL LOW (ref >60)
Glucose, Bld: 203 mg/dL — ABNORMAL HIGH (ref 70–99)
Potassium: 3.4 mmol/L — ABNORMAL LOW (ref 3.5–5.1)
Sodium: 138 mmol/L (ref 135–145)
Total Bilirubin: 0.7 mg/dL (ref 0.3–1.2)
Total Protein: 7.7 g/dL (ref 6.5–8.1)

## 2019-12-22 MED ORDER — HYDROMORPHONE HCL 1 MG/ML IJ SOLN
1.0000 mg | INTRAMUSCULAR | Status: DC | PRN
  Administered 2019-12-22: 1 mg via INTRAMUSCULAR
  Filled 2019-12-22: qty 1

## 2019-12-22 MED ORDER — KETOROLAC TROMETHAMINE 30 MG/ML IJ SOLN
30.0000 mg | Freq: Once | INTRAMUSCULAR | Status: AC
  Administered 2019-12-22: 30 mg via INTRAMUSCULAR
  Filled 2019-12-22: qty 1

## 2019-12-22 MED ORDER — OXYCODONE HCL 5 MG PO TABS
10.0000 mg | ORAL_TABLET | Freq: Once | ORAL | Status: AC
  Administered 2019-12-22: 10 mg via ORAL
  Filled 2019-12-22: qty 2

## 2019-12-22 NOTE — ED Notes (Signed)
Patient transported to CT 

## 2019-12-22 NOTE — ED Provider Notes (Signed)
[facility] Emergency Department Provider Note    First MD Initiated Contact with Patient 12/22/19 [phone]     (approximate)  I have reviewed the triage vital signs and the nursing notes.   HISTORY  Chief Complaint Back Pain    HPI Howard Murphy is a 64 y.o. male with history of chronic back pain for pain clinic status post previous spinal surgeries and injections presents to the ER for several days of right back pain going into his groin that started after the patient was helping move a water heater.  Did not feel any sudden pop.  Is not having any numbness or tingling.  Still able to ambulate.  No fevers.  Has been taken as prescribed home medications without relief.  Does have a history of kidney stones and feels somewhat similar to that.  Denies any loss of control of his bowel or bladder.    Past Medical History:  Diagnosis Date  . 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  . Back ache 05/05/2013  . Diabetes mellitus without complication ([place])   . GERD (gastroesophageal reflux disease)   . Headache   . History of kidney stones   . Hyperlipemia   . Hypertension   . Kidney stones   . Kidney stones   . Nephrolithiasis   . Shortness of breath dyspnea    Family History  Problem Relation Age of Onset  . Cancer Mother        Breast CA  . Stroke Father   . Heart disease Father   . Cancer Sister   . Cancer Maternal Grandmother   . Cancer Sister   . Bladder Cancer Neg Hx   . Prostate cancer Neg Hx   . Kidney cancer Neg Hx    Past Surgical History:  Procedure Laterality Date  . APPENDECTOMY    . BACK SURGERY  2015   x3  . BACK SURGERY  08/09/2016  . BACK SURGERY Bilateral 2019  . CHOLECYSTECTOMY  2000  . COLONOSCOPY WITH PROPOFOL N/A 04/11/2015   Procedure: COLONOSCOPY WITH PROPOFOL;  Surgeon: Manya Silvas, MD;  Location: [facility] ENDOSCOPY;  Service: Endoscopy;  Laterality: N/A;  . CYSTOSCOPY WITH URETEROSCOPY AND STENT PLACEMENT Right 08/27/2017    Procedure: CYSTOSCOPY WITH URETEROSCOPY AND STENT PLACEMENT;  Surgeon: Abbie Sons, MD;  Location: [facility] ORS;  Service: Urology;  Laterality: Right;  . ESOPHAGOGASTRODUODENOSCOPY (EGD) WITH PROPOFOL N/A 04/11/2015   Procedure: ESOPHAGOGASTRODUODENOSCOPY (EGD) WITH PROPOFOL;  Surgeon: Manya Silvas, MD;  Location: [facility] ENDOSCOPY;  Service: Endoscopy;  Laterality: N/A;  . LAPAROSCOPIC APPENDECTOMY N/A 12/18/2017   Procedure: APPENDECTOMY LAPAROSCOPIC;  Surgeon: Florene Glen, MD;  Location: [facility] ORS;  Service: General;  Laterality: N/A;  . SHOULDER SURGERY Right 2016  . SHOULDER SURGERY Left 06/2015   Patient Active Problem List   Diagnosis Date Noted  . Pharmacologic therapy 11/25/2019  . Disorder of skeletal system 11/25/2019  . Problems influencing health status 11/25/2019  . Osteoarthritis involving multiple joints 02/23/2019  . Chronic musculoskeletal pain 02/23/2019  . Insomnia secondary to chronic pain 02/23/2019  . Neurogenic claudication 04/02/2018  . Chronic bilateral low back pain with bilateral sciatica 03/05/2018  . Spondylosis without myelopathy or radiculopathy, lumbosacral region 01/14/2018  . Other specified dorsopathies, sacral and sacrococcygeal region 12/26/2017  . Acute appendicitis 12/18/2017  . Medication monitoring encounter 03/07/2017  . 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 Spondylolisthesis at L3-L4  level 08/03/2016  . Chronic pain syndrome 07/09/2016  . Encounter for medication monitoring 02/24/2016  . Cervical spondylosis 12/17/2015  . Cervical facet arthropathy 12/17/2015  . Chronic neck pain (Bilateral) (R>L) 12/17/2015  . Cervical facet syndrome (Bilateral) (R>L) 12/17/2015  . Chronic upper extremity pain (Bilateral) (R>L) 12/17/2015  . Cervical foraminal stenosis 12/17/2015  .  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 Arthropathy of shoulder  (Right) 12/17/2015  . 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 Lumbar foraminal stenosis (L4-5) (Left) 12/17/2015  . Grade 1 Anterolisthesis (2 mm) of L4 over L5. 12/17/2015  . Lumbar facet (L4-5) synovial cyst (6 mm) (Left) 12/17/2015  . Chronic sacroiliac joint pain (Bilateral) (L>R) 12/08/2015  . Neurogenic pain 12/08/2015  . Abnormal MRI, lumbar spine 11/21/2015  . Radicular pain of shoulder (Right) 10/03/2015  . Carpal tunnel syndrome (Bilateral) 10/03/2015  . Chronic cervical radicular pain (Right) 10/03/2015  . Chronic low back pain (1ry area of Pain) (Bilateral) (L>R) w/o sciatica 09/06/2015  . Opiate use (60 MME/Day) 09/05/2015  . Long term prescription opiate use 09/05/2015  . Long term current use of opiate analgesic 09/05/2015  . Spondylosis of lumbar spine 09/05/2015  . Lumbar facet joint syndrome ([place]) 09/05/2015  . Chronic hip pain (Left) 09/05/2015  . Diabetic peripheral neuropathy ([place]) 09/05/2015  . Chronic lumbar radicular pain (S1 Dermatome) (Bilateral) (L>R) 09/05/2015  . Hypokalemia 09/05/2015  . Chronic shoulder pain (2ry area of Pain) (Bilateral) (L>R) 09/05/2015  . Diabetes ([place]) 09/05/2015  . Chronic foot pain (3ry area of Pain) (Bilateral) (L>R) 09/05/2015  . Chronic hand pain (4th area of Pain) (Bilateral) (R>L) 09/05/2015  . Lumbar facet hypertrophy 09/05/2015  . Encounter for therapeutic drug level monitoring 09/05/2015  . Low testosterone 09/05/2015  . Occipital pain (Right) 09/05/2015  . 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 Hypertriglyceridemia 07/26/2015  . Chronic abdominal pain (RUQ) 07/14/2015  . Type 2 diabetes mellitus with peripheral neuropathy ([place]) 07/14/2015  . Chest pain 07/14/2015  . Complete tear of the shoulder rotator cuff (Left) 05/10/2015  . Complete tear of left rotator cuff 05/10/2015  . SOB (shortness of breath) 03/22/2015  . Elevated hemoglobin ([place]) 07/07/2013  . History of lumbar facet Synovial  cyst, surgically removed. 05/29/2013    Class: History of  . 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 GERD (gastroesophageal reflux disease) 05/05/2013  . Benign prostatic hyperplasia 05/05/2013  . Hypertension 05/08/2011  . Hyperlipidemia 05/08/2011      Prior to Admission medications   Medication Sig Start Date End Date Taking? Authorizing Provider  albuterol (PROVENTIL HFA;VENTOLIN HFA) 108 (90 Base) MCG/ACT inhaler Inhale 2 puffs into the lungs every 6 (six) hours as needed for wheezing or shortness of breath. 05/29/17   Fisher, Linden Dolin, PA-C  atorvastatin (LIPITOR) 40 MG tablet Take 1 tablet (40 mg total) by mouth at bedtime. 03/08/17   Arnetha Courser, MD  canagliflozin (INVOKANA) 100 MG TABS tablet Take 100 mg by mouth daily before breakfast.    [provider]  chlorthalidone (HYGROTON) 50 MG tablet Take 50 mg by mouth daily.    [provider]  cholecalciferol (VITAMIN D) 25 MCG (1000 UT) tablet Take 1 tablet by mouth daily. 02/26/19   [provider]  lisinopril (ZESTRIL) 20 MG tablet Take 20 mg by mouth daily. 09/18/18   [provider]  meclizine (ANTIVERT) 25 MG tablet Take 25 mg by mouth every 6 (six) hours as needed for dizziness.  [provider]  Melatonin 10 MG CAPS Take 20 mg by mouth at bedtime. 08/29/19 02/25/20  Milinda Pointer, MD  ondansetron (ZOFRAN ODT) 4 MG disintegrating tablet Take 1 tablet (4 mg total) by mouth every 8 (eight) hours as needed for nausea or vomiting. 12/29/17   Darel Hong, MD  Oxycodone HCl 10 MG TABS Take 1 tablet (10 mg total) by mouth every 8 (eight) hours. Must last 30 days. 12/03/19 01/02/20  Milinda Pointer, MD  potassium chloride (K-DUR) 10 MEQ tablet Take 1 tablet (10 mEq total) by mouth daily. Patient taking differently: Take 10 mEq by mouth daily as needed (For low potassium when instructred by his doctor.).  08/23/17   Stoioff, Ronda Fairly, MD  tamsulosin (FLOMAX) 0.4 MG CAPS capsule Take 1  capsule (0.4 mg total) by mouth daily. 11/04/18   Billey Co, MD  testosterone cypionate (DEPOTESTOSTERONE CYPIONATE) 200 MG/ML injection Inject 200 mg into the muscle every 14 (fourteen) days. 10/07/18   [provider]    Allergies Adhesive [tape]    Social History Social History   Tobacco Use  . Smoking status: Never Smoker  . Smokeless tobacco: Former Systems developer    Types: Secondary school teacher  . Vaping Use: Never used  Substance Use Topics  . Alcohol use: No  . Drug use: No    Review of Systems Patient denies headaches, rhinorrhea, blurry vision, numbness, shortness of breath, chest pain, edema, cough, abdominal pain, nausea, vomiting, diarrhea, dysuria, fevers, rashes or hallucinations unless otherwise stated above in HPI. ____________________________________________   PHYSICAL EXAM:  VITAL SIGNS: Vitals:   12/22/19 0229 12/22/19 0755  BP: 129/80 (!) 109/59  Pulse: 83 78  Resp: 18 18  Temp: 98 F (36.7 C) 98.2 F (36.8 C)  SpO2: 98% 97%    Constitutional: Alert and oriented.  Eyes: Conjunctivae are normal.  Head: Atraumatic. Nose: No congestion/rhinnorhea. Mouth/Throat: Mucous membranes are moist.   Neck: No stridor. Painless ROM.  Cardiovascular: Normal rate, regular rhythm. Grossly normal heart sounds.  Good peripheral circulation. Respiratory: Normal respiratory effort.  No retractions. Lungs CTAB. Gastrointestinal: Soft and nontender. No distention. No abdominal bruits. + right CVA tenderness. Genitourinary: no inguinal mass or lymphadenopathy Musculoskeletal: No lower extremity tenderness nor edema.  No joint effusions. Neurologic:  Normal speech and language. No gross focal neurologic deficits are appreciated. No facial droop. 5/5 BLE strength.  SILT Skin:  Skin is warm, dry and intact. No rash noted. Psychiatric: Mood and affect are normal. Speech and behavior are normal.  ____________________________________________   LABS (all labs ordered  are listed, but only abnormal results are displayed)  Results for orders placed or performed during the hospital encounter of 12/22/19 (from the past 24 hour(s))  Urinalysis, Complete w Microscopic     Status: Abnormal   Collection Time: 12/22/19  2:31 AM  Result Value Ref Range   Color, Urine YELLOW (A) YELLOW   APPearance CLEAR (A) CLEAR   Specific Gravity, Urine 1.031 (H) 1.005 - 1.030   pH 5.0 5.0 - 8.0   Glucose, UA >=500 (A) NEGATIVE mg/dL   Hgb urine dipstick NEGATIVE NEGATIVE   Bilirubin Urine NEGATIVE NEGATIVE   Ketones, ur NEGATIVE NEGATIVE mg/dL   Protein, ur NEGATIVE NEGATIVE mg/dL   Nitrite NEGATIVE NEGATIVE   Leukocytes,Ua NEGATIVE NEGATIVE   RBC / HPF 0-5 0 - 5 RBC/hpf   WBC, UA 0-5 0 - 5 WBC/hpf   Bacteria, UA NONE SEEN NONE SEEN   Squamous Epithelial / LPF  0-5 0 - 5   Mucus PRESENT   Comprehensive metabolic panel     Status: Abnormal   Collection Time: 12/22/19  2:31 AM  Result Value Ref Range   Sodium 138 135 - 145 mmol/L   Potassium 3.4 (L) 3.5 - 5.1 mmol/L   Chloride 98 98 - 111 mmol/L   CO2 29 22 - 32 mmol/L   Glucose, Bld 203 (H) 70 - 99 mg/dL   BUN 16 8 - 23 mg/dL   Creatinine, Ser 1.36 (H) 0.61 - 1.24 mg/dL   Calcium 9.6 8.9 - 10.3 mg/dL   Total Protein 7.7 6.5 - 8.1 g/dL   Albumin 4.3 3.5 - 5.0 g/dL   AST 29 15 - 41 U/L   ALT 36 0 - 44 U/L   Alkaline Phosphatase 68 38 - 126 U/L   Total Bilirubin 0.7 0.3 - 1.2 mg/dL   GFR calc non Af Amer 55 (L) >60 mL/min   GFR calc Af Amer >60 >60 mL/min   Anion gap 11 5 - 15  CBC     Status: None   Collection Time: 12/22/19  2:31 AM  Result Value Ref Range   WBC 10.2 4.0 - 10.5 K/uL   RBC 5.02 4.22 - 5.81 MIL/uL   Hemoglobin 14.8 13.0 - 17.0 g/dL   HCT 43.2 39 - 52 %   MCV 86.1 80.0 - 100.0 fL   MCH 29.5 26.0 - 34.0 pg   MCHC 34.3 30.0 - 36.0 g/dL   RDW 13.3 11.5 - 15.5 %   Platelets 322 150 - 400 K/uL   nRBC 0.0 0.0 - 0.2 %    ____________________________________________  EKG____________________________________________  RADIOLOGY  I personally reviewed all radiographic images ordered to evaluate for the above acute complaints and reviewed radiology reports and findings.  These findings were personally discussed with the patient.  Please see medical record for radiology report.  ____________________________________________   PROCEDURES  Procedure(s) performed:  Procedures    Critical Care performed: no ____________________________________________   INITIAL IMPRESSION / ASSESSMENT AND PLAN / ED COURSE  Pertinent labs & imaging results that were available during my care of the patient were reviewed by me and considered in my medical decision making (see chart for details).   DDX: Degenerative disc disease, stone, cauda equina, spinal stenosis, sciatica, musculoskeletal strain, hernia  JAMARIUS SAHA is a 64 y.o. who presents to the ED with symptoms as described above.  Patient in no acute distress.  History sounds consistent with musculoskeletal strain given recent heavy lifting.  CT imaging was ordered for the but differential.  He is not febrile otherwise nontoxic not consistent with epidural abscess, cauda equina or critical spinal stenosis.  Does not have any motor or sensory deficits.  Patient given pain medication here in the ER.  His work-up is reassuring.  I do believe he stable and appropriate for outpatient referral.  We discussed signs and symptoms for which she should return to the ER.     The patient was evaluated in Emergency Department today for the symptoms described in the history of present illness. He/she was evaluated in the context of the global COVID-19 pandemic, which necessitated consideration that the patient might be at risk for infection with the SARS-CoV-2 virus that causes COVID-19. Institutional protocols and algorithms that pertain to the evaluation of patients at risk for  COVID-19 are in a state of rapid change based on information released by regulatory bodies including the CDC and federal and state  organizations. These policies and algorithms were followed during the patient's care in the ED.  As part of my medical decision making, I reviewed the following data within the [place] notes reviewed and incorporated, Labs reviewed, notes from prior ED visits and [place] Controlled Substance Database   ____________________________________________   FINAL CLINICAL IMPRESSION(S) / ED DIAGNOSES  Final diagnoses:  Acute right-sided low back pain, unspecified whether sciatica present      NEW MEDICATIONS STARTED DURING THIS VISIT:  New Prescriptions   No medications on file     Note:  This document was prepared using Dragon voice recognition software and may include unintentional dictation errors.    Merlyn Lot, MD 12/22/19 [phone]

## 2019-12-22 NOTE — Discharge Instructions (Signed)

## 2019-12-22 NOTE — ED Triage Notes (Signed)
Lower back pain radiating down into RLQ over last few days. States he lifted a water heater a few days ago and has had pain since. Pt alert and oriented X4, cooperative, RR even and unlabored, color WNL. Pt in NAD.

## 2019-12-23 ENCOUNTER — Emergency Department: Payer: Medicare Other

## 2019-12-23 ENCOUNTER — Other Ambulatory Visit: Payer: Self-pay

## 2019-12-23 ENCOUNTER — Emergency Department
Admission: EM | Admit: 2019-12-23 | Discharge: 2019-12-23 | Disposition: A | Discharge status: Home / Self Care | Payer: Medicare Other | Attending: Emergency Medicine | Admitting: Emergency Medicine

## 2019-12-23 DIAGNOSIS — M545 Low back pain: Secondary | ICD-10-CM | POA: Diagnosis not present

## 2019-12-23 DIAGNOSIS — Z87891 Personal history of nicotine dependence: Secondary | ICD-10-CM | POA: Diagnosis not present

## 2019-12-23 DIAGNOSIS — Z79899 Other long term (current) drug therapy: Secondary | ICD-10-CM | POA: Diagnosis not present

## 2019-12-23 DIAGNOSIS — N50811 Right testicular pain: Secondary | ICD-10-CM | POA: Diagnosis not present

## 2019-12-23 DIAGNOSIS — M48062 Spinal stenosis, lumbar region with neurogenic claudication: Secondary | ICD-10-CM | POA: Insufficient documentation

## 2019-12-23 DIAGNOSIS — N433 Hydrocele, unspecified: Secondary | ICD-10-CM | POA: Diagnosis not present

## 2019-12-23 DIAGNOSIS — E1140 Type 2 diabetes mellitus with diabetic neuropathy, unspecified: Secondary | ICD-10-CM | POA: Diagnosis not present

## 2019-12-23 DIAGNOSIS — M48061 Spinal stenosis, lumbar region without neurogenic claudication: Secondary | ICD-10-CM

## 2019-12-23 DIAGNOSIS — I10 Essential (primary) hypertension: Secondary | ICD-10-CM | POA: Insufficient documentation

## 2019-12-23 DIAGNOSIS — N503 Cyst of epididymis: Secondary | ICD-10-CM | POA: Diagnosis not present

## 2019-12-23 MED ORDER — KETOROLAC TROMETHAMINE 60 MG/2ML IM SOLN
30.0000 mg | Freq: Once | INTRAMUSCULAR | Status: AC
  Administered 2019-12-23: 30 mg via INTRAMUSCULAR
  Filled 2019-12-23: qty 2

## 2019-12-23 MED ORDER — PREDNISONE 10 MG (21) PO TBPK: ORAL_TABLET | ORAL | 0 refills | Status: DC

## 2019-12-23 MED ORDER — CYCLOBENZAPRINE HCL 10 MG PO TABS: 10.0000 mg | ORAL_TABLET | Freq: Three times a day (TID) | ORAL | 0 refills | Status: DC | PRN

## 2019-12-23 MED ORDER — OXYCODONE-ACETAMINOPHEN 5-325 MG PO TABS
1.0000 | ORAL_TABLET | Freq: Once | ORAL | Status: AC
  Administered 2019-12-23: 1 via ORAL
  Filled 2019-12-23: qty 1

## 2019-12-23 MED ORDER — OXYCODONE HCL 5 MG PO TABS
10.0000 mg | ORAL_TABLET | Freq: Once | ORAL | Status: AC
  Administered 2019-12-23: 10 mg via ORAL
  Filled 2019-12-23: qty 2

## 2019-12-23 NOTE — ED Notes (Signed)
See triage note  Presents with right sided lower back pain  States pain is mainly to right side and moves into right anterior hip area  Ambulates with slight limp d/t pain  States he was seen yesterday  But pain is getting worse  Denies any n/v or urinary sx's at present

## 2019-12-23 NOTE — ED Provider Notes (Signed)
[facility] Emergency Department Provider Note ____________________________________________  Time seen: Approximately 7:28 AM  I have reviewed the triage vital signs and the nursing notes.   HISTORY  Chief Complaint Back Pain    HPI Howard Murphy is a 64 y.o. male who presents to the emergency department for evaluation and treatment of right lower back pain that is radiating into his right groin and testicle.  He does have chronic back pain but this is different.  He was evaluated here yesterday for the same and diagnosed with back pain related to muscle strain.  Symptoms did start after he helped move a hot water heater a few days ago.  He reports that his symptoms have gotten worse since yesterday.  He denies any loss of bowel or bladder control.   He states the pain today is mainly in the groin and right testicle.  Medications provided yesterday have not given him much relief.  Past Medical History:  Diagnosis Date  . 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  . Back ache 05/05/2013  . Diabetes mellitus without complication ([place])   . GERD (gastroesophageal reflux disease)   . Headache   . History of kidney stones   . Hyperlipemia   . Hypertension   . Kidney stones   . Kidney stones   . Nephrolithiasis   . Shortness of breath dyspnea     Patient Active Problem List   Diagnosis Date Noted  . Pharmacologic therapy 11/25/2019  . Disorder of skeletal system 11/25/2019  . Problems influencing health status 11/25/2019  . Osteoarthritis involving multiple joints 02/23/2019  . Chronic musculoskeletal pain 02/23/2019  . Insomnia secondary to chronic pain 02/23/2019  . Neurogenic claudication 04/02/2018  . Chronic bilateral low back pain with bilateral sciatica 03/05/2018  . Spondylosis without myelopathy or radiculopathy, lumbosacral region 01/14/2018  . Other specified dorsopathies, sacral and sacrococcygeal region 12/26/2017  . Acute appendicitis 12/18/2017   . Medication monitoring encounter 03/07/2017  . 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 Spondylolisthesis at L3-L4 level 08/03/2016  . Chronic pain syndrome 07/09/2016  . Encounter for medication monitoring 02/24/2016  . Cervical spondylosis 12/17/2015  . Cervical facet arthropathy 12/17/2015  . Chronic neck pain (Bilateral) (R>L) 12/17/2015  . Cervical facet syndrome (Bilateral) (R>L) 12/17/2015  . Chronic upper extremity pain (Bilateral) (R>L) 12/17/2015  . Cervical foraminal stenosis 12/17/2015  .  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 Arthropathy of shoulder (Right) 12/17/2015  . 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 Lumbar foraminal stenosis (L4-5) (Left) 12/17/2015  . Grade 1 Anterolisthesis (2 mm) of L4 over L5. 12/17/2015  . Lumbar facet (L4-5) synovial cyst (6 mm) (Left) 12/17/2015  . Chronic sacroiliac joint pain (Bilateral) (L>R) 12/08/2015  . Neurogenic pain 12/08/2015  . Abnormal MRI, lumbar spine 11/21/2015  . Radicular pain of shoulder (Right) 10/03/2015  . Carpal tunnel syndrome (Bilateral) 10/03/2015  . Chronic cervical radicular pain (Right) 10/03/2015  . Chronic low back pain (1ry area of Pain) (Bilateral) (L>R) w/o sciatica 09/06/2015  . Opiate use (60 MME/Day) 09/05/2015  . Long term prescription opiate use 09/05/2015  . Long term current use of opiate analgesic 09/05/2015  . Spondylosis of lumbar spine 09/05/2015  . Lumbar facet joint syndrome ([place]) 09/05/2015  . Chronic hip pain (Left) 09/05/2015  . Diabetic peripheral neuropathy ([place]) 09/05/2015  . Chronic lumbar radicular pain (S1 Dermatome) (Bilateral) (L>R) 09/05/2015  .  Hypokalemia 09/05/2015  . Chronic shoulder pain (2ry area of Pain) (Bilateral) (L>R) 09/05/2015  . Diabetes ([place]) 09/05/2015  . Chronic foot pain (3ry area of Pain) (Bilateral) (L>R) 09/05/2015  . Chronic hand pain  (4th area of Pain) (Bilateral) (R>L) 09/05/2015  . Lumbar facet hypertrophy 09/05/2015  . Encounter for therapeutic drug level monitoring 09/05/2015  . Low testosterone 09/05/2015  . Occipital pain (Right) 09/05/2015  . 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 Hypertriglyceridemia 07/26/2015  . Chronic abdominal pain (RUQ) 07/14/2015  . Type 2 diabetes mellitus with peripheral neuropathy ([place]) 07/14/2015  . Chest pain 07/14/2015  . Complete tear of the shoulder rotator cuff (Left) 05/10/2015  . Complete tear of left rotator cuff 05/10/2015  . SOB (shortness of breath) 03/22/2015  . Elevated hemoglobin ([place]) 07/07/2013  . History of lumbar facet Synovial cyst, surgically removed. 05/29/2013    Class: History of  . 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 GERD (gastroesophageal reflux disease) 05/05/2013  . Benign prostatic hyperplasia 05/05/2013  . Hypertension 05/08/2011  . Hyperlipidemia 05/08/2011    Past Surgical History:  Procedure Laterality Date  . APPENDECTOMY    . BACK SURGERY  2015   x3  . BACK SURGERY  08/09/2016  . BACK SURGERY Bilateral 2019  . CHOLECYSTECTOMY  2000  . COLONOSCOPY WITH PROPOFOL N/A 04/11/2015   Procedure: COLONOSCOPY WITH PROPOFOL;  Surgeon: Manya Silvas, MD;  Location: [facility] ENDOSCOPY;  Service: Endoscopy;  Laterality: N/A;  . CYSTOSCOPY WITH URETEROSCOPY AND STENT PLACEMENT Right 08/27/2017   Procedure: CYSTOSCOPY WITH URETEROSCOPY AND STENT PLACEMENT;  Surgeon: Abbie Sons, MD;  Location: [facility] ORS;  Service: Urology;  Laterality: Right;  . ESOPHAGOGASTRODUODENOSCOPY (EGD) WITH PROPOFOL N/A 04/11/2015   Procedure: ESOPHAGOGASTRODUODENOSCOPY (EGD) WITH PROPOFOL;  Surgeon: Manya Silvas, MD;  Location: [facility] ENDOSCOPY;  Service: Endoscopy;  Laterality: N/A;  . LAPAROSCOPIC APPENDECTOMY N/A 12/18/2017   Procedure: APPENDECTOMY LAPAROSCOPIC;  Surgeon: Florene Glen, MD;  Location: [facility] ORS;  Service: General;  Laterality: N/A;  .  SHOULDER SURGERY Right 2016  . SHOULDER SURGERY Left 06/2015    Prior to Admission medications   Medication Sig Start Date End Date Taking? Authorizing Provider  albuterol (PROVENTIL HFA;VENTOLIN HFA) 108 (90 Base) MCG/ACT inhaler Inhale 2 puffs into the lungs every 6 (six) hours as needed for wheezing or shortness of breath. 05/29/17   Fisher, Linden Dolin, PA-C  atorvastatin (LIPITOR) 40 MG tablet Take 1 tablet (40 mg total) by mouth at bedtime. 03/08/17   Arnetha Courser, MD  canagliflozin (INVOKANA) 100 MG TABS tablet Take 100 mg by mouth daily before breakfast.    [provider]  chlorthalidone (HYGROTON) 50 MG tablet Take 50 mg by mouth daily.    [provider]  cholecalciferol (VITAMIN D) 25 MCG (1000 UT) tablet Take 1 tablet by mouth daily. 02/26/19   [provider]  cyclobenzaprine (FLEXERIL) 10 MG tablet Take 1 tablet (10 mg total) by mouth 3 (three) times daily as needed for muscle spasms. 12/23/19   Jasslyn Finkel B, FNP  lisinopril (ZESTRIL) 20 MG tablet Take 20 mg by mouth daily. 09/18/18   [provider]  meclizine (ANTIVERT) 25 MG tablet Take 25 mg by mouth every 6 (six) hours as needed for dizziness.    [provider]  Melatonin 10 MG CAPS Take 20 mg by mouth at bedtime. 08/29/19 02/25/20  Milinda Pointer, MD  ondansetron (ZOFRAN ODT) 4 MG disintegrating tablet Take 1 tablet (4 mg total)  by mouth every 8 (eight) hours as needed for nausea or vomiting. 12/29/17   Darel Hong, MD  Oxycodone HCl 10 MG TABS Take 1 tablet (10 mg total) by mouth every 8 (eight) hours. Must last 30 days. 12/03/19 01/02/20  Milinda Pointer, MD  potassium chloride (K-DUR) 10 MEQ tablet Take 1 tablet (10 mEq total) by mouth daily. Patient taking differently: Take 10 mEq by mouth daily as needed (For low potassium when instructred by his doctor.).  08/23/17   Stoioff, Ronda Fairly, MD  predniSONE (STERAPRED UNI-PAK 21 TAB) 10 MG (21) TBPK tablet Take 6 tablets on the  first day and decrease by 1 tablet each day until finished. 12/23/19   Hezekiah Veltre, Johnette Abraham B, FNP  tamsulosin (FLOMAX) 0.4 MG CAPS capsule Take 1 capsule (0.4 mg total) by mouth daily. 11/04/18   Billey Co, MD  testosterone cypionate (DEPOTESTOSTERONE CYPIONATE) 200 MG/ML injection Inject 200 mg into the muscle every 14 (fourteen) days. 10/07/18   [provider]    Allergies Adhesive [tape]  Family History  Problem Relation Age of Onset  . Cancer Mother        Breast CA  . Stroke Father   . Heart disease Father   . Cancer Sister   . Cancer Maternal Grandmother   . Cancer Sister   . Bladder Cancer Neg Hx   . Prostate cancer Neg Hx   . Kidney cancer Neg Hx     Social History Social History   Tobacco Use  . Smoking status: Never Smoker  . Smokeless tobacco: Former Systems developer    Types: Secondary school teacher  . Vaping Use: Never used  Substance Use Topics  . Alcohol use: No  . Drug use: No    Review of Systems Constitutional: Negative for fever. Cardiovascular: Negative for chest pain. Respiratory: Negative for shortness of breath. Musculoskeletal: Positive for right side back pain with radiation into the groin and testicle. Skin: Negative for open wounds or lesions. Neurological: Positive for radiculopathy into the groin and anterior right leg.  ____________________________________________   PHYSICAL EXAM:  VITAL SIGNS: ED Triage Vitals [12/23/19 0239]  Enc Vitals Group     BP 119/69     Pulse Rate 97     Resp 17     Temp (!) 97.4 F (36.3 C)     Temp Source Oral     SpO2 99 %     Weight 190 lb (86.2 kg)     Height 5\' 6"  (1.676 m)     Head Circumference      Peak Flow      Pain Score 10     Pain Loc      Pain Edu?      Excl. in [place]?     Constitutional: Alert and oriented. Well appearing and in no acute distress. Eyes: Conjunctivae are clear without discharge or drainage Head: Atraumatic Neck: Supple. Respiratory: No cough. Respirations are even and  unlabored. Musculoskeletal: Patient able to demonstrate flexion and extension of the right lower extremity.  Straight leg raise is negative on the right and left side.  Ambulates with antalgic gait. Neurologic: Radicular symptoms from the right side back to the groin and anterior right leg. Skin: No rash, lesion, or wound on exposed skin surfaces. Psychiatric: Affect and behavior are appropriate.  ____________________________________________   LABS (all labs ordered are listed, but only abnormal results are displayed)  Labs Reviewed - No data to display ____________________________________________  RADIOLOGY  Ultrasound of the  scrotum is negative for acute findings.  I, Sherrie George, personally viewed and evaluated these images (plain radiographs) as part of my medical decision making, as well as reviewing the written report by the radiologist.  MRI results of the lumbar spine exam show interval decompression and fusion at L2-3 and L5-S1 without evidence of central foraminal work canal stenosis at these levels.  He does have progressive adjacent segmental disease at L1-2 with moderate canal stenosis and mild right foraminal stenosis.  No results found. ____________________________________________   PROCEDURES  Procedures  ____________________________________________   INITIAL IMPRESSION / ASSESSMENT AND PLAN / ED COURSE  Howard Murphy is a 64 y.o. who presents to the emergency department for treatment and evaluation of right-sided back pain with radiation into the groin and right testicle.  See HPI for further details.  He had a CT scan to rule out renal stone yesterday which was negative.  Will do an ultrasound of the right testicle today since he feels that the pain is worse in that area.  Ultrasound of the right testicle is negative for acute findings.  Due to the increase in pain, radiculopathy into the groin and anterior aspect of the right lower extremity plan will be to  perform MRI.  Patient agrees with this plan.  Results discussed with the patient.  He will follow up with both pain management and neurosurgery.  He has a pain management plan with Dr. Dossie Arbour and was advised not to take any additional pain medications unless he is cleared to do so.  He will be given a prescription for prednisone and Flexeril.  He was advised to return to the emergency department for symptoms of change or worsen if he is unable to see his primary care or the neuro specialist.   Medications  oxyCODONE (Oxy IR/ROXICODONE) immediate release tablet 10 mg (10 mg Oral Given 12/23/19 0922)  ketorolac (TORADOL) injection 30 mg (30 mg Intramuscular Given 12/23/19 0923)  oxyCODONE-acetaminophen (PERCOCET/ROXICET) 5-325 MG per tablet 1 tablet (1 tablet Oral Given 12/23/19 1400)    Pertinent labs & imaging results that were available during my care of the patient were reviewed by me and considered in my medical decision making (see chart for details).   _________________________________________   FINAL CLINICAL IMPRESSION(S) / ED DIAGNOSES  Final diagnoses:  Pain in right testicle  Foraminal stenosis of lumbar region    ED Discharge Orders         Ordered    predniSONE (STERAPRED UNI-PAK 21 TAB) 10 MG (21) TBPK tablet     Discontinue  Reprint     12/23/19 1349    cyclobenzaprine (FLEXERIL) 10 MG tablet  3 times daily PRN     Discontinue  Reprint     12/23/19 1349           If controlled substance prescribed during this visit, 12 month history viewed on the [place] prior to issuing an initial prescription for Schedule II or III opiod.   Victorino Dike, FNP 12/24/19 1523    Earleen Newport, MD 12/25/19 1517

## 2019-12-23 NOTE — ED Triage Notes (Signed)
Pt arrives to ED via POV from home with c/o chronic lower back pain. Pt seen yesterday for same and told to come back if pain worsened; pt states "it got worse". No new injury, fall or trauma.

## 2019-12-23 NOTE — ED Notes (Signed)
Po and IM pain meds given   Pt is speaking with MRI at this time

## 2019-12-23 NOTE — Discharge Instructions (Addendum)
Please call pain management and the neurosurgeon to schedule appointments.  Return to the ER for symptoms that change or worsen if unable to schedule an appointment.

## 2019-12-29 NOTE — Progress Notes (Signed)
PROVIDER NOTE: Information contained herein reflects review and annotations entered in association with encounter. Interpretation of such information and data should be left to medically-trained personnel. Information provided to patient can be located elsewhere in the medical record under "Patient Instructions". Document created using STT-dictation technology, any transcriptional errors that may result from process are unintentional.    Patient: Howard Murphy  Service Category: E/M  Provider: Gaspar Cola, MD  DOB: 1956/04/30  DOS: 12/30/2019  Specialty: Interventional Pain Management  MRN: [id number]  Setting: Ambulatory outpatient  PCP: [facility] [place]  Type: Established Patient    Referring Provider: [facility] [place]*  Location: Office  Delivery: Face-to-face     HPI  Reason for encounter: Howard Murphy, a 64 y.o. year old male, is here today for evaluation and management of his Chronic pain syndrome [G89.4]. Howard Murphy primary complain today is Back Pain (lumbar, right is worse.   disc problem?  appt with surgeon tomorrow. ) Last encounter: Practice (12/03/2019). My last encounter with him was on 12/03/2019. Pertinent problems: Howard Murphy has History of lumbar facet Synovial cyst, surgically removed.; Chronic abdominal pain (RUQ); Type 2 diabetes mellitus with peripheral neuropathy ([place]); Spondylosis of lumbar spine; Lumbar facet joint syndrome ([place]); Chronic hip pain (Left); Diabetic peripheral neuropathy ([place]); Chronic lumbar radicular pain (S1 Dermatome) (Bilateral) (L>R); Complete tear of the shoulder rotator cuff (Left); Chronic shoulder pain (2ry area of Pain) (Bilateral) (L>R); Chronic foot pain (3ry area of Pain) (Bilateral) (L>R); Chronic hand pain (4th area of Pain) (Bilateral) (R>L); Lumbar facet hypertrophy; Occipital pain (Right); Failed back surgical syndrome 4; Chronic low back pain (1ry area of Pain) (Bilateral) (L>R) w/o sciatica; Radicular pain of  shoulder (Right); Carpal tunnel syndrome (Bilateral); Chronic cervical radicular pain (Right); Abnormal MRI, lumbar spine (12/23/2019); Chronic sacroiliac joint pain (Bilateral) (L>R); Neurogenic pain; Cervical spondylosis; Cervical facet arthropathy; Chronic neck pain (Bilateral) (R>L); Cervical facet syndrome (Bilateral) (R>L); Chronic upper extremity pain (Bilateral) (R>L); Cervical foraminal stenosis;  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, C5-6); Arthropathy of shoulder (Right); 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); Lumbar foraminal stenosis (L4-5) (Left); Grade 1 Anterolisthesis (2 mm) of L4 over L5.; Lumbar facet (L4-5) synovial cyst (6 mm) (Left); Chronic pain syndrome; Spondylolisthesis at L3-L4 level; Complete tear of left rotator cuff; Bertolotti's syndrome (L5-S1) (Bilateral); 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 Pain Assessment: Severity of Chronic pain is reported as a 10-Worst pain ever/10. Location: Back Lower, Left, Right/down right leg in the back approx to the knee. Onset: More than a month ago. Quality: Constant, Discomfort, Stabbing. Timing: Constant. Modifying factor(s): nothing currently. Vitals:  height is '5\' 6"'  (1.676 m) and weight is 190 lb (86.2 kg). His temporal temperature is 97.2 F (36.2 C) (abnormal). His blood pressure is 151/82 (abnormal) and his pulse is 85. His respiration is 16 and oxygen saturation is 97%.   The patient comes into the clinic today with an acute exacerbation of his low back pain on the right side with pain going into his groin area.  Clearly his cat a right L1/L2  radiculopathy.  A recent MRI of the lumbar spine shows a disc herniation at the L1-2 level with caudal migration of approximately 7 mm.  He is supposed to  see the surgeon tomorrow to set out a date for the surgery. The patient indicates doing well with the current medication regimen. No adverse reactions or side effects reported to the medications.  Today I have provided the patient with the handout to be given to the surgeon to assist him in the postoperative management of his chronic pain.  Pharmacotherapy Assessment   Analgesic: Oxycodone IR 10 mg 1 tab PO q 8 hrs (30 mg/day of oxycodone) MME/day:20m/day.   Monitoring: [place] PMP: PDMP reviewed during this encounter.       Pharmacotherapy: No side-effects or adverse reactions reported. Compliance: No problems identified. Effectiveness: Clinically acceptable.  PJanett Billow RN  12/30/2019  2:23 PM  Sign when Signing Visit Nursing Pain Medication Assessment:  Safety precautions to be maintained throughout the outpatient stay will include: orient to surroundings, keep bed in low position, maintain call bell within reach at all times, provide assistance with transfer out of bed and ambulation.  Medication Inspection Compliance: Pill count conducted under aseptic conditions, in front of the patient. Neither the pills nor the bottle was removed from the patient's sight at any time. Once count was completed pills were immediately returned to the patient in their original bottle.  Medication: Oxycodone IR Pill/Patch Count: 0 of 90 pills remain Pill/Patch Appearance: Markings consistent with prescribed medication Bottle Appearance: Standard pharmacy container. Clearly labeled. Filled Date: 06 / 24 / 2021 Last Medication intake:  Today    UDS:  Summary  Date Value Ref Range Status  12/02/2019 Note  Final    Comment:    ==================================================================== [employer] [facility]  (MW) ==================================================================== Test                             Result       Flag       Units  Drug Present and Declared for Prescription Verification   Oxycodone                      6169         EXPECTED   ng/mg creat   Oxymorphone                    2086         EXPECTED   ng/mg creat   Noroxycodone                   4079         EXPECTED   ng/mg creat   Noroxymorphone                 645          EXPECTED   ng/mg creat    Sources of oxycodone are scheduled prescription medications.    Oxymorphone, noroxycodone, and noroxymorphone are expected    metabolites of oxycodone. Oxymorphone is also available as a    scheduled prescription medication.  ==================================================================== Test                      Result    Flag   Units      Ref Range   Creatinine              141              mg/dL      >=20 ==================================================================== Declared Medications:  The flagging and interpretation on this report are based  on the  following declared medications.  Unexpected results may arise from  inaccuracies in the declared medications.   **Note: The testing scope of this panel includes these medications:   Oxycodone   **Note: The testing scope of this panel does not include the  following reported medications:   Albuterol (Ventolin HFA)  Atorvastatin (Lipitor)  Canagliflozin (Invokana)  Chlorthalidone (Hygroton)  Lisinopril (Zestril)  Meclizine (Antivert)  Melatonin  Ondansetron (Zofran)  Potassium (Klor-Con)  Tamsulosin (Flomax)  Testosterone  Vitamin D ==================================================================== For clinical consultation, please call [phone]. ====================================================================      ROS  Constitutional: Denies any fever or chills Gastrointestinal: No reported hemesis, hematochezia, vomiting, or  acute GI distress Musculoskeletal: Denies any acute onset joint swelling, redness, loss of ROM, or weakness Neurological: No reported episodes of acute onset apraxia, aphasia, dysarthria, agnosia, amnesia, paralysis, loss of coordination, or loss of consciousness  Medication Review  Melatonin, Oxycodone HCl, albuterol, atorvastatin, canagliflozin, chlorthalidone, cholecalciferol, cyclobenzaprine, lisinopril, meclizine, ondansetron, potassium chloride, tamsulosin, and testosterone cypionate  History Review  Allergy: Howard Murphy is allergic to adhesive [tape]. Drug: Howard Murphy  reports no history of drug use. Alcohol:  reports no history of alcohol use. Tobacco:  reports that he has never smoked. He quit smokeless tobacco use about 6 years ago.  His smokeless tobacco use included chew. Social: Howard Murphy  reports that he has never smoked. He quit smokeless tobacco use about 6 years ago.  His smokeless tobacco use included chew. He reports that he does not drink alcohol and does not use drugs. Medical:  has a past medical history of Arthritis, Back ache (05/05/2013), Diabetes mellitus without complication ([place]), GERD (gastroesophageal reflux disease), Headache, History of kidney stones, Hyperlipemia, Hypertension, Kidney stones, Kidney stones, Nephrolithiasis, and Shortness of breath dyspnea. Surgical: Howard Murphy  has a past surgical history that includes Cholecystectomy (2000); Shoulder surgery (Right, 2016); Colonoscopy with propofol (N/A, 04/11/2015); Esophagogastroduodenoscopy (egd) with propofol (N/A, 04/11/2015); Shoulder surgery (Left, 06/2015); Back surgery (2015); Back surgery (08/09/2016); Cystoscopy with ureteroscopy and stent placement (Right, 08/27/2017); laparoscopic appendectomy (N/A, 12/18/2017); Appendectomy; and Back surgery (Bilateral, 2019). Family: family history includes Cancer in his maternal grandmother, mother, sister, and sister; Heart disease in his father; Stroke in his  father.  Laboratory Chemistry Profile   Renal Lab Results  Component Value Date   BUN 16 12/22/2019   CREATININE 1.36 (H) 12/22/2019   LABCREA 182 02/24/2016   BCR 14 03/07/2017   GFRAA >60 12/22/2019   GFRNONAA 55 (L) 12/22/2019     Hepatic Lab Results  Component Value Date   AST 29 12/22/2019   ALT 36 12/22/2019   ALBUMIN 4.3 12/22/2019   ALKPHOS 68 12/22/2019   LIPASE 46 12/03/2018     Electrolytes Lab Results  Component Value Date   NA 138 12/22/2019   K 3.4 (L) 12/22/2019   CL 98 12/22/2019   CALCIUM 9.6 12/22/2019   MG 2.0 11/29/2016     Bone Lab Results  Component Value Date   25OHVITD1 24 (L) 11/29/2016   25OHVITD2 <1.0 11/29/2016   25OHVITD3 24 11/29/2016     Inflammation (CRP: Acute Phase) (ESR: Chronic Phase) Lab Results  Component Value Date   CRP 1.8 11/29/2016   ESRSEDRATE 11 11/29/2016       Note: Above Lab results reviewed.  Recent Imaging Review  MR LUMBAR SPINE WO CONTRAST CLINICAL DATA:  Low back pain with right-sided radiculopathy  EXAM: MRI LUMBAR SPINE WITHOUT CONTRAST  TECHNIQUE: Multiplanar, multisequence MR imaging of  the lumbar spine was performed. No intravenous contrast was administered.  COMPARISON:  X-ray 04/04/2018, MRI 03/12/2018  FINDINGS: Segmentation: Transitional lumbosacral anatomy. In keeping with numbering convention on prior MRI, the lowest well developed disc space is designated as L5-S1.  Alignment:  Trace retrolisthesis L1 on L2, new from prior.  Vertebrae: Posterior interbody fusion extending from L2 through S1 with additional bi-iliac fixation screws. Progressive discogenic endplate marrow changes at the L1-2 level. No evidence of acute fracture or discitis. No suspicious bone lesion.  Conus medullaris and cauda equina: Conus extends to the T12-L1 level. Conus and cauda equina appear normal.  Paraspinal and other soft tissues: Negative.  Disc levels:  T12-L1: Sagittal sequences only. No  significant disc protrusion, foraminal stenosis, or canal stenosis.  L1-L2: Progressive disc height loss with mild diffuse disc bulge. Superimposed small central disc extrusion with 7 mm caudal migration of disc material centrally. Mild bilateral facet hypertrophy. There is moderate canal stenosis and mild right foraminal stenosis, both progressed from prior.  L2-L3: Interval decompression and fusion. No residual foraminal or canal stenosis. Findings improved from prior.  L3-L4: Prior decompression and fusion. No evidence of residual foraminal or canal stenosis. Unchanged.  L4-L5: Prior decompression and fusion. No evidence of residual foraminal or canal stenosis. Unchanged.  L5-S1: Transitional level. Interval fusion. No foraminal or canal stenosis.  IMPRESSION: 1. Interval decompression and fusion at L2-L3 and L5-S1 without evidence of residual foraminal or canal stenosis at these levels. 2. 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 3. Transitional lumbosacral anatomy.  Electronically Signed   By: Davina Poke D.O.   On: 12/23/2019 12:54 US SCROTUM W/DOPPLER CLINICAL DATA:  Acute right testicular pain.  EXAM: SCROTAL ULTRASOUND  DOPPLER ULTRASOUND OF THE TESTICLES  TECHNIQUE: Complete ultrasound examination of the testicles, epididymis, and other scrotal structures was performed. Color and spectral Doppler ultrasound were also utilized to evaluate blood flow to the testicles.  COMPARISON:  September 13, 2006.  FINDINGS: Right testicle  Measurements: 3.7 x 2.3 x 1.4 cm. No mass or microlithiasis visualized.  Left testicle  Measurements: 3.6 x 2.3 x 1.3 cm. No mass or microlithiasis visualized.  Right epididymis:  1.1 cm right epididymal cyst is noted.  Left epididymis:  Normal in size and appearance.  Hydrocele:  Minimal bilateral hydroceles are noted.  Varicocele:  None  visualized.  Pulsed Doppler interrogation of both testes demonstrates normal low resistance arterial and venous waveforms bilaterally.  IMPRESSION: No evidence of testicular mass or torsion. Minimal bilateral hydroceles. Small right epididymal cyst.  Electronically Signed   By: Marijo Conception M.D.   On: 12/23/2019 08:43 Note: Reviewed        Physical Exam  General appearance: Well nourished, well developed, and well hydrated. In no apparent acute distress Mental status: Alert, oriented x 3 (person, place, & time)       Respiratory: No evidence of acute respiratory distress Eyes: PERLA Vitals: BP (!) 151/82 (BP Location: Right Arm, Patient Position: Sitting, Cuff Size: Normal)   Pulse 85   Temp (!) 97.2 F (36.2 C) (Temporal)   Resp 16   Ht '5\' 6"'  (1.676 m)   Wt 190 lb (86.2 kg)   SpO2 97%   BMI 30.67 kg/m  BMI: Estimated body mass index is 30.67 kg/m as calculated from the following:   Height as of this encounter: '5\' 6"'  (1.676 m).   Weight as of this encounter: 190 lb (86.2 kg). Ideal:  Ideal body weight: 63.8 kg (140 lb 10.5 oz) Adjusted ideal body weight: 72.8 kg (160 lb 6.3 oz)  Assessment   Status Diagnosis  Controlled Worsened Worsened 1. Chronic pain syndrome   2. Acute exacerbation of chronic low back pain   3. Chronic low back pain (1ry area of Pain) (Bilateral) (L>R) w/o sciatica   4. Chronic shoulder pain (2ry area of Pain) (Bilateral) (L>R)   5. Chronic foot pain (3ry area of Pain) (Bilateral) (L>R)   6. Chronic hand pain (4th area of Pain) (Bilateral) (R>L)   7. Chronic musculoskeletal pain   8. Grade 1 Anterolisthesis (2 mm) of L4 over L5.   9. Spinal stenosis of lumbar region with neurogenic claudication   10. Lumbar facet (L4-5) synovial cyst (6 mm) (Left)   11. Lumbar facet hypertrophy   12. Lumbar foraminal stenosis (L4-5) (Left)   13. Abnormal MRI, lumbar spine   14. Pharmacologic therapy   15. Opiate use (60 MME/Day)      Updated  Problems: Problem  Acute Exacerbation of Chronic Low Back Pain  Abnormal MRI, lumbar spine (12/23/2019)   FINDINGS: Segmentation: Transitional lumbosacral anatomy. Lowest well developed disc space is designatedL5-S1. Alignment:  Trace retrolisthesis L1 on L2, new  Vertebrae: Posterior interbody fusion extending from L2 through S1 with additional bi-iliac fixation screws. Progressive discogenic endplate marrow changes at the L1-2 level.  Disc levels: L1-2: Progressive disc height loss with mild diffuse disc bulge. Superimposed small central disc extrusion with 7 mm caudal migration of disc material centrally. Mild bilateral facet hypertrophy. There is moderate canal stenosis and mild right foraminal stenosis, both progressed from prior.  IMPRESSION: 1. Decompression and fusion L2-S1 2. Progressive adjacent segment disease at L1-2 with moderate canal stenosis and mild right foraminal stenosis. 3. Transitional lumbosacral anatomy.     Plan of Care  Problem-specific:  No problem-specific Assessment & Plan notes found for this encounter.  Howard Murphy has a current medication list which includes the following long-term medication(s): albuterol, atorvastatin, chlorthalidone, melatonin, [START ON 01/02/2020] oxycodone hcl, [START ON 02/01/2020] oxycodone hcl, [START ON 03/02/2020] oxycodone hcl, and potassium chloride.  Pharmacotherapy (Medications Ordered): Meds ordered this encounter  Medications  . Oxycodone HCl 10 MG TABS    Sig: Take 1 tablet (10 mg total) by mouth every 8 (eight) hours. Must last 30 days.    Dispense:  90 tablet    Refill:  0    Chronic Pain: STOP Act (Not applicable) Fill 1 day early if closed on refill date. Do not fill until: 01/02/2020. To last until: 02/01/2020. Avoid benzodiazepines within 8 hours of opioids  . Oxycodone HCl 10 MG TABS    Sig: Take 1 tablet (10 mg total) by mouth every 8 (eight) hours. Must last 30 days.    Dispense:  90 tablet    Refill:   0    Chronic Pain: STOP Act (Not applicable) Fill 1 day early if closed on refill date. Do not fill until: 02/01/2020. To last until: 03/02/2020. Avoid benzodiazepines within 8 hours of opioids  . Oxycodone HCl 10 MG TABS    Sig: Take 1 tablet (10 mg total) by mouth every 8 (eight) hours. Must last 30 days.    Dispense:  90 tablet    Refill:  0    Chronic Pain: STOP Act (Not applicable) Fill 1 day early if closed on refill date. Do not fill until: 03/02/2020. To last until: 04/01/2020. Avoid benzodiazepines within 8 hours of opioids  .  ketorolac (TORADOL) injection 60 mg  . orphenadrine (NORFLEX) injection 60 mg   Orders:  No orders of the defined types were placed in this encounter.  Follow-up plan:   Return in about 13 weeks (around 03/30/2020) for 20-min, F2F, MM (on eval day).      Interventional management options: Planned, scheduled, and/or pending:      Considering:   Diagnostic bilateral cervical facet block Possible bilateral cervical facet RFA  Diagnostic right greater occipital NB Possible right GON RFA  Possible right GON peripheral nerve stimulator trial  Diagnostic right CESI  Diagnostic bilateral IA shoulder injection Diagnostic bilateral suprascapular NB Possible bilateral suprascapular nerve RFA  Diagnostic bilateral SI joint block Possible bilateral SI joint RFA  Diagnostic bilateral lumbar facet block Possible bilateral lumbar facet RFA  Diagnostic left L4 TFESI  Diagnostic left L4-5 LESI  Diagnostic left IA hip joint injection Possible left hip joint RFA  Diagnostic bilateral median nerve block(carpal tunnel block)  Diagnostic right CPB   Palliative PRN treatment(s):   Palliative/Diagnostic left SI Block (Area of L5 Transverse process spatulation) #2  Palliative IM Toradol/Norflex 60/60 (PRN)      Recent Visits Date Type Provider Dept  12/03/19 Office Visit Milinda Pointer, MD [facility]  Showing recent visits within past 90 days  and meeting all other requirements Today's Visits Date Type Provider Dept  12/30/19 Office Visit Milinda Pointer, MD [facility]  Showing today's visits and meeting all other requirements Future Appointments Date Type Provider Dept  02/22/20 Appointment Milinda Pointer, MD [facility]  03/16/20 Appointment Milinda Pointer, MD [facility]  Showing future appointments within next 90 days and meeting all other requirements  I discussed the assessment and treatment plan with the patient. The patient was provided an opportunity to ask questions and all were answered. The patient agreed with the plan and demonstrated an understanding of the instructions.  Patient advised to call back or seek an in-person evaluation if the symptoms or condition worsens.  Duration of encounter: 30 minutes.  Note by: Gaspar Cola, MD Date: 12/30/2019; Time: 4:41 PM

## 2019-12-30 ENCOUNTER — Other Ambulatory Visit: Payer: Self-pay

## 2019-12-30 ENCOUNTER — Ambulatory Visit: Payer: Medicare Other | Attending: Pain Medicine | Admitting: Pain Medicine

## 2019-12-30 ENCOUNTER — Encounter: Payer: Self-pay | Admitting: Pain Medicine

## 2019-12-30 VITALS — BP 151/82 | HR 85 | Temp 97.2°F | Resp 16 | Ht 66.0 in | Wt 190.0 lb

## 2019-12-30 DIAGNOSIS — G8929 Other chronic pain: Secondary | ICD-10-CM | POA: Diagnosis present

## 2019-12-30 DIAGNOSIS — M25512 Pain in left shoulder: Secondary | ICD-10-CM | POA: Insufficient documentation

## 2019-12-30 DIAGNOSIS — Z79899 Other long term (current) drug therapy: Secondary | ICD-10-CM | POA: Diagnosis present

## 2019-12-30 DIAGNOSIS — M47816 Spondylosis without myelopathy or radiculopathy, lumbar region: Secondary | ICD-10-CM | POA: Diagnosis not present

## 2019-12-30 DIAGNOSIS — G894 Chronic pain syndrome: Secondary | ICD-10-CM | POA: Diagnosis not present

## 2019-12-30 DIAGNOSIS — M48061 Spinal stenosis, lumbar region without neurogenic claudication: Secondary | ICD-10-CM | POA: Diagnosis not present

## 2019-12-30 DIAGNOSIS — R937 Abnormal findings on diagnostic imaging of other parts of musculoskeletal system: Secondary | ICD-10-CM | POA: Diagnosis not present

## 2019-12-30 DIAGNOSIS — F1190 Opioid use, unspecified, uncomplicated: Secondary | ICD-10-CM

## 2019-12-30 DIAGNOSIS — M545 Low back pain: Secondary | ICD-10-CM | POA: Diagnosis not present

## 2019-12-30 DIAGNOSIS — M7138 Other bursal cyst, other site: Secondary | ICD-10-CM

## 2019-12-30 DIAGNOSIS — M7918 Myalgia, other site: Secondary | ICD-10-CM | POA: Diagnosis not present

## 2019-12-30 DIAGNOSIS — M4310 Spondylolisthesis, site unspecified: Secondary | ICD-10-CM

## 2019-12-30 DIAGNOSIS — M48062 Spinal stenosis, lumbar region with neurogenic claudication: Secondary | ICD-10-CM | POA: Diagnosis not present

## 2019-12-30 DIAGNOSIS — G5793 Unspecified mononeuropathy of bilateral lower limbs: Secondary | ICD-10-CM

## 2019-12-30 DIAGNOSIS — M79642 Pain in left hand: Secondary | ICD-10-CM | POA: Diagnosis present

## 2019-12-30 DIAGNOSIS — M25511 Pain in right shoulder: Secondary | ICD-10-CM | POA: Insufficient documentation

## 2019-12-30 DIAGNOSIS — M79641 Pain in right hand: Secondary | ICD-10-CM

## 2019-12-30 MED ORDER — OXYCODONE HCL 10 MG PO TABS: 10.0000 mg | ORAL_TABLET | Freq: Three times a day (TID) | ORAL | 0 refills | Status: DC

## 2019-12-30 MED ORDER — ORPHENADRINE CITRATE 30 MG/ML IJ SOLN
60.0000 mg | Freq: Once | INTRAMUSCULAR | Status: AC
  Administered 2019-12-30: 60 mg via INTRAMUSCULAR

## 2019-12-30 MED ORDER — KETOROLAC TROMETHAMINE 60 MG/2ML IM SOLN
INTRAMUSCULAR | Status: AC
  Filled 2019-12-30: qty 2

## 2019-12-30 MED ORDER — ORPHENADRINE CITRATE 30 MG/ML IJ SOLN
INTRAMUSCULAR | Status: AC
  Filled 2019-12-30: qty 2

## 2019-12-30 MED ORDER — KETOROLAC TROMETHAMINE 60 MG/2ML IM SOLN
60.0000 mg | Freq: Once | INTRAMUSCULAR | Status: AC
  Administered 2019-12-30: 60 mg via INTRAMUSCULAR

## 2019-12-30 NOTE — Patient Instructions (Signed)
____________________________________________________________________________________________  Drug Holidays (Slow)  What is a "Drug Holiday"? Drug Holiday: is the name given to the period of time during which a patient stops taking a medication(s) for the purpose of eliminating tolerance to the drug.  Benefits . Improved effectiveness of opioids. . Decreased opioid dose needed to achieve benefits. . Improved pain with lesser dose.  What is tolerance? Tolerance: is the progressive decreased in effectiveness of a drug due to its repetitive use. With repetitive use, the body gets use to the medication and as a consequence, it loses its effectiveness. This is a common problem seen with opioid pain medications. As a result, a larger dose of the drug is needed to achieve the same effect that used to be obtained with a smaller dose.  How long should a "Drug Holiday" last? You should stay off of the pain medicine for at least 14 consecutive days. (2 weeks)  Should I stop the medicine "cold turkey"? No. You should always coordinate with your Pain Specialist so that he/she can provide you with the correct medication dose to make the transition as smoothly as possible.  How do I stop the medicine? Slowly. You will be instructed to decrease the daily amount of pills that you take by one (1) pill every seven (7) days. This is called a "slow downward taper" of your dose. For example: if you normally take four (4) pills per day, you will be asked to drop this dose to three (3) pills per day for seven (7) days, then to two (2) pills per day for seven (7) days, then to one (1) per day for seven (7) days, and at the end of those last seven (7) days, this is when the "Drug Holiday" would start.   Will I have withdrawals? By doing a "slow downward taper" like this one, it is unlikely that you will experience any significant withdrawal symptoms. Typically, what triggers withdrawals is the sudden stop of a high  dose opioid therapy. Withdrawals can usually be avoided by slowly decreasing the dose over a prolonged period of time. If you do not follow these instructions and decide to stop your medication abruptly, withdrawals may be possible.  What are withdrawals? Withdrawals: refers to the wide range of symptoms that occur after stopping or dramatically reducing opiate drugs after heavy and prolonged use. Withdrawal symptoms do not occur to patients that use low dose opioids, or those who take the medication sporadically. Contrary to benzodiazepine (example: Valium, Xanax, etc.) or alcohol withdrawals ("Delirium Tremens"), opioid withdrawals are not lethal. Withdrawals are the physical manifestation of the body getting rid of the excess receptors.  Expected Symptoms Early symptoms of withdrawal may include: . Agitation . Anxiety . Muscle aches . Increased tearing . Insomnia . Runny nose . Sweating . Yawning  Late symptoms of withdrawal may include: . Abdominal cramping . Diarrhea . Dilated pupils . Goose bumps . Nausea . Vomiting  Will I experience withdrawals? Due to the slow nature of the taper, it is very unlikely that you will experience any.  What is a slow taper? Taper: refers to the gradual decrease in dose.  (Last update: 12/30/2019) ____________________________________________________________________________________________    ____________________________________________________________________________________________  Medication Rules  Purpose: To inform patients, and their family members, of our rules and regulations.  Applies to: All patients receiving prescriptions (written or electronic).  Pharmacy of record: Pharmacy where electronic prescriptions will be sent. If written prescriptions are taken to a different pharmacy, please inform the nursing staff. The pharmacy   listed in the electronic medical record should be the one where you would like electronic prescriptions  to be sent.  Electronic prescriptions: In compliance with the [place] Strengthen Opioid Misuse Prevention (STOP) Act of 2017 (Session Law 2017-74/H243), effective June 11, 2018, all controlled substances must be electronically prescribed. Calling prescriptions to the pharmacy will cease to exist.  Prescription refills: Only during scheduled appointments. Applies to all prescriptions.  NOTE: The following applies primarily to controlled substances (Opioid* Pain Medications).   Type of encounter (visit): For patients receiving controlled substances, face-to-face visits are required. (Not an option or up to the patient.)  Patient's responsibilities: 1. Pain Pills: Bring all pain pills to every appointment (except for procedure appointments). 2. Pill Bottles: Bring pills in original pharmacy bottle. Always bring the newest bottle. Bring bottle, even if empty. 3. Medication refills: You are responsible for knowing and keeping track of what medications you take and those you need refilled. The day before your appointment: write a list of all prescriptions that need to be refilled. The day of the appointment: give the list to the admitting nurse. Prescriptions will be written only during appointments. No prescriptions will be written on procedure days. If you forget a medication: it will not be "Called in", "Faxed", or "electronically sent". You will need to get another appointment to get these prescribed. No early refills. Do not call asking to have your prescription filled early. 4. Prescription Accuracy: You are responsible for carefully inspecting your prescriptions before leaving our office. Have the discharge nurse carefully go over each prescription with you, before taking them home. Make sure that your name is accurately spelled, that your address is correct. Check the name and dose of your medication to make sure it is accurate. Check the number of pills, and the written instructions to  make sure they are clear and accurate. Make sure that you are given enough medication to last until your next medication refill appointment. 5. Taking Medication: Take medication as prescribed. When it comes to controlled substances, taking less pills or less frequently than prescribed is permitted and encouraged. Never take more pills than instructed. Never take medication more frequently than prescribed.  6. Inform other Doctors: Always inform, all of your healthcare providers, of all the medications you take. 7. Pain Medication from other Providers: You are not allowed to accept any additional pain medication from any other Doctor or Healthcare provider. There are two exceptions to this rule. (see below) In the event that you require additional pain medication, you are responsible for notifying us, as stated below. 8. Medication Agreement: You are responsible for carefully reading and following our Medication Agreement. This must be signed before receiving any prescriptions from our practice. Safely store a copy of your signed Agreement. Violations to the Agreement will result in no further prescriptions. (Additional copies of our Medication Agreement are available upon request.) 9. Laws, Rules, & Regulations: All patients are expected to follow all Federal and State Laws, Statutes, Rules, & Regulations. Ignorance of the Laws does not constitute a valid excuse.  10. Illegal drugs and Controlled Substances: The use of illegal substances (including, but not limited to marijuana and its derivatives) and/or the illegal use of any controlled substances is strictly prohibited. Violation of this rule may result in the immediate and permanent discontinuation of any and all prescriptions being written by our practice. The use of any illegal substances is prohibited. 11. Adopted CDC guidelines & recommendations: Target dosing levels will be at or   below 60 MME/day. Use of benzodiazepines** is not  recommended.  Exceptions: There are only two exceptions to the rule of not receiving pain medications from other Healthcare Providers. 1. Exception #1 (Emergencies): In the event of an emergency (i.e.: accident requiring emergency care), you are allowed to receive additional pain medication. However, you are responsible for: As soon as you are able, call our office [phone], at any time of the day or night, and leave a message stating your name, the date and nature of the emergency, and the name and dose of the medication prescribed. In the event that your call is answered by a member of our staff, make sure to document and save the date, time, and the name of the person that took your information.  2. Exception #2 (Planned Surgery): In the event that you are scheduled by another doctor or dentist to have any type of surgery or procedure, you are allowed (for a period no longer than 30 days), to receive additional pain medication, for the acute post-op pain. However, in this case, you are responsible for picking up a copy of our "Post-op Pain Management for Surgeons" handout, and giving it to your surgeon or dentist. This document is available at our office, and does not require an appointment to obtain it. Simply go to our office during business hours (Monday-Thursday from 8:00 AM to 4:00 PM) (Friday 8:00 AM to 12:00 Noon) or if you have a scheduled appointment with us, prior to your surgery, and ask for it by name. In addition, you will need to provide us with your name, name of your surgeon, type of surgery, and date of procedure or surgery.  *Opioid medications include: morphine, codeine, oxycodone, oxymorphone, hydrocodone, hydromorphone, meperidine, tramadol, tapentadol, buprenorphine, fentanyl, methadone. **Benzodiazepine medications include: diazepam (Valium), alprazolam (Xanax), clonazepam (Klonopine), lorazepam (Ativan), clorazepate (Tranxene), chlordiazepoxide (Librium), estazolam (Prosom),  oxazepam (Serax), temazepam (Restoril), triazolam (Halcion) (Last updated: 08/08/2017) ____________________________________________________________________________________________   ____________________________________________________________________________________________  Medication Recommendations and Reminders  Applies to: All patients receiving prescriptions (written and/or electronic).  Medication Rules & Regulations: These rules and regulations exist for your safety and that of others. They are not flexible and neither are we. Dismissing or ignoring them will be considered "non-compliance" with medication therapy, resulting in complete and irreversible termination of such therapy. (See document titled "Medication Rules" for more details.) In all conscience, because of safety reasons, we cannot continue providing a therapy where the patient does not follow instructions.  Pharmacy of record:   Definition: This is the pharmacy where your electronic prescriptions will be sent.   We do not endorse any particular pharmacy, however, we have experienced problems with [facility] not securing enough medication supply for the community.  We do not restrict you in your choice of pharmacy. However, once we write for your prescriptions, we will NOT be re-sending more prescriptions to fix restricted supply problems created by your pharmacy, or your insurance.   The pharmacy listed in the electronic medical record should be the one where you want electronic prescriptions to be sent.  If you choose to change pharmacy, simply notify our nursing staff.  Recommendations:  Keep all of your pain medications in a safe place, under lock and key, even if you live alone. We will NOT replace lost, stolen, or damaged medication.  After you fill your prescription, take 1 week's worth of pills and put them away in a safe place. You should keep a separate, properly labeled bottle for this purpose. The remainder    should be kept in the original bottle. Use this as your primary supply, until it runs out. Once it's gone, then you know that you have 1 week's worth of medicine, and it is time to come in for a prescription refill. If you do this correctly, it is unlikely that you will ever run out of medicine.  To make sure that the above recommendation works, it is very important that you make sure your medication refill appointments are scheduled at least 1 week before you run out of medicine. To do this in an effective manner, make sure that you do not leave the office without scheduling your next medication management appointment. Always ask the nursing staff to show you in your prescription , when your medication will be running out. Then arrange for the receptionist to get you a return appointment, at least 7 days before you run out of medicine. Do not wait until you have 1 or 2 pills left, to come in. This is very poor planning and does not take into consideration that we may need to cancel appointments due to bad weather, sickness, or emergencies affecting our staff.  DO NOT ACCEPT A "Partial Fill": If for any reason your pharmacy does not have enough pills/tablets to completely fill or refill your prescription, do not allow for a "partial fill". The law allows the pharmacy to complete that prescription within 72 hours, without requiring a new prescription. If they do not fill the rest of your prescription within those 72 hours, you will need a separate prescription to fill the remaining amount, which we will NOT provide. If the reason for the partial fill is your insurance, you will need to talk to the pharmacist about payment alternatives for the remaining tablets, but again, DO NOT ACCEPT A PARTIAL FILL, unless you can trust your pharmacist to obtain the remainder of the pills within 72 hours.  Prescription refills and/or changes in medication(s):   Prescription refills, and/or changes in dose or medication,  will be conducted only during scheduled medication management appointments. (Applies to both, written and electronic prescriptions.)  No refills on procedure days. No medication will be changed or started on procedure days. No changes, adjustments, and/or refills will be conducted on a procedure day. Doing so will interfere with the diagnostic portion of the procedure.  No phone refills. No medications will be "called into the pharmacy".  No Fax refills.  No weekend refills.  No Holliday refills.  No after hours refills.  Remember:  Business hours are:  Monday to Thursday 8:00 AM to 4:00 PM Provider's Schedule: Karem Tomaso, MD - Appointments are:  Medication management: Monday and Wednesday 8:00 AM to 4:00 PM Procedure day: Tuesday and Thursday 7:30 AM to 4:00 PM Bilal Lateef, MD - Appointments are:  Medication management: Tuesday and Thursday 8:00 AM to 4:00 PM Procedure day: Monday and Wednesday 7:30 AM to 4:00 PM (Last update: 12/30/2019) ____________________________________________________________________________________________   ____________________________________________________________________________________________  CANNABIDIOL (AKA: CBD Oil or Pills)  Applies to: All patients receiving prescriptions of controlled substances (written and/or electronic).  General Information: Cannabidiol (CBD), a derivative of Marijuana, was discovered in 1940. It is one of some 113 identified cannabinoids in cannabis (Marijuana) plants, accounting for up to 40% of the plant's extract. As of 2018, preliminary clinical research on cannabidiol included studies of anxiety, cognition, movement disorders, and pain.  Cannabidiol is consummed in multiple ways, including inhalation of cannabis smoke or vapor, as an aerosol spray into the cheek, and by mouth. It   may be supplied as CBD oil containing CBD as the active ingredient (no added tetrahydrocannabinol (THC) or terpenes), a full-plant  CBD-dominant hemp extract oil, capsules, dried cannabis, or as a liquid solution. CBD is thought not have the same psychoactivity as THC, and may affect the actions of THC. Studies suggest that CBD may interact with different biological targets, including cannabinoid receptors and other neurotransmitter receptors. As of 2018 the mechanism of action for its biological effects has not been determined.  In the [place], cannabidiol has a limited approval by the Food and Drug Administration (FDA) for treatment of only two types of epilepsy disorders. The side effects of long-term use of the drug include somnolence, decreased appetite, diarrhea, fatigue, malaise, weakness, sleeping problems, and others.  CBD remains a Schedule I drug prohibited for any use.  Legality: Some manufacturers ship CBD products nationally, an illegal action which the FDA has not enforced in 2018, with CBD remaining the subject of an FDA investigational new drug evaluation, and is not considered legal as a dietary supplement or food ingredient as of December 2018. Federal illegality has made it difficult historically to conduct research on CBD. CBD is openly sold in head shops and health food stores in some states where such sales have not been explicitly legalized.  Warning: Because it is not FDA approved for general use or treatment of pain, it is not required to undergo the same manufacturing controls as prescription drugs.  This means that the available cannabidiol (CBD) may be contaminated with THC.  If this is the case, it will trigger a positive urine drug screen (UDS) test for cannabinoids (Marijuana).  Because a positive UDS for illicit substances is a violation of our medication agreement, your opioid analgesics (pain medicine) may be permanently discontinued. (Last update: 12/30/2019) ____________________________________________________________________________________________    

## 2019-12-30 NOTE — Progress Notes (Signed)
Nursing Pain Medication Assessment:  Safety precautions to be maintained throughout the outpatient stay will include: orient to surroundings, keep bed in low position, maintain call bell within reach at all times, provide assistance with transfer out of bed and ambulation.  Medication Inspection Compliance: Pill count conducted under aseptic conditions, in front of the patient. Neither the pills nor the bottle was removed from the patient's sight at any time. Once count was completed pills were immediately returned to the patient in their original bottle.  Medication: Oxycodone IR Pill/Patch Count: 0 of 90 pills remain Pill/Patch Appearance: Markings consistent with prescribed medication Bottle Appearance: Standard pharmacy container. Clearly labeled. Filled Date: 06 / 24 / 2021 Last Medication intake:  Today

## 2019-12-31 DIAGNOSIS — M5136 Other intervertebral disc degeneration, lumbar region: Secondary | ICD-10-CM | POA: Diagnosis not present

## 2019-12-31 DIAGNOSIS — G894 Chronic pain syndrome: Secondary | ICD-10-CM | POA: Diagnosis not present

## 2019-12-31 DIAGNOSIS — M4316 Spondylolisthesis, lumbar region: Secondary | ICD-10-CM | POA: Diagnosis not present

## 2020-01-04 ENCOUNTER — Telehealth: Payer: Self-pay | Admitting: *Deleted

## 2020-01-04 ENCOUNTER — Telehealth: Payer: Self-pay | Admitting: Pain Medicine

## 2020-01-04 NOTE — Telephone Encounter (Signed)
Called and patient and he states that [facility] does not his any Oxy 10mg  and may be a month before they get any. He called around and found that [facility] on [address] has some. Can you please transfer his med for 01/02/20 Oxy 10mg  to [facility] on [address].   Pharm has been changed on his chart.

## 2020-01-04 NOTE — Telephone Encounter (Signed)
Will you do this? 

## 2020-01-04 NOTE — Telephone Encounter (Signed)
Pts wife called and stated that [facility] does not have any of his medication in stock. She called [facility] on [address] and stated they have it in stock and requests the Oxycodone 10mg  be sent there.

## 2020-01-05 ENCOUNTER — Other Ambulatory Visit: Payer: Self-pay

## 2020-01-05 ENCOUNTER — Ambulatory Visit (HOSPITAL_BASED_OUTPATIENT_CLINIC_OR_DEPARTMENT_OTHER): Payer: Medicare Other | Admitting: Pain Medicine

## 2020-01-05 ENCOUNTER — Ambulatory Visit
Admission: RE | Admit: 2020-01-05 | Discharge: 2020-01-05 | Disposition: A | Discharge status: Home / Self Care | Payer: Medicare Other | Source: Ambulatory Visit | Attending: Pain Medicine | Admitting: Pain Medicine

## 2020-01-05 ENCOUNTER — Encounter: Payer: Self-pay | Admitting: Pain Medicine

## 2020-01-05 VITALS — BP 121/90 | HR 108 | Temp 98.5°F | Resp 16 | Ht 66.0 in | Wt 188.0 lb

## 2020-01-05 DIAGNOSIS — M5137 Other intervertebral disc degeneration, lumbosacral region: Secondary | ICD-10-CM | POA: Insufficient documentation

## 2020-01-05 DIAGNOSIS — M545 Low back pain, unspecified: Secondary | ICD-10-CM

## 2020-01-05 DIAGNOSIS — M5388 Other specified dorsopathies, sacral and sacrococcygeal region: Secondary | ICD-10-CM | POA: Diagnosis not present

## 2020-01-05 DIAGNOSIS — R937 Abnormal findings on diagnostic imaging of other parts of musculoskeletal system: Secondary | ICD-10-CM | POA: Diagnosis not present

## 2020-01-05 DIAGNOSIS — M5126 Other intervertebral disc displacement, lumbar region: Secondary | ICD-10-CM | POA: Insufficient documentation

## 2020-01-05 DIAGNOSIS — M533 Sacrococcygeal disorders, not elsewhere classified: Secondary | ICD-10-CM | POA: Diagnosis not present

## 2020-01-05 DIAGNOSIS — G894 Chronic pain syndrome: Secondary | ICD-10-CM

## 2020-01-05 DIAGNOSIS — M9973 Connective tissue and disc stenosis of intervertebral foramina of lumbar region: Secondary | ICD-10-CM | POA: Insufficient documentation

## 2020-01-05 DIAGNOSIS — R1031 Right lower quadrant pain: Secondary | ICD-10-CM | POA: Insufficient documentation

## 2020-01-05 DIAGNOSIS — G8929 Other chronic pain: Secondary | ICD-10-CM | POA: Diagnosis not present

## 2020-01-05 DIAGNOSIS — Q7649 Other congenital malformations of spine, not associated with scoliosis: Secondary | ICD-10-CM | POA: Diagnosis not present

## 2020-01-05 DIAGNOSIS — M5417 Radiculopathy, lumbosacral region: Secondary | ICD-10-CM

## 2020-01-05 DIAGNOSIS — M48061 Spinal stenosis, lumbar region without neurogenic claudication: Secondary | ICD-10-CM | POA: Insufficient documentation

## 2020-01-05 DIAGNOSIS — M961 Postlaminectomy syndrome, not elsewhere classified: Secondary | ICD-10-CM | POA: Insufficient documentation

## 2020-01-05 DIAGNOSIS — M4310 Spondylolisthesis, site unspecified: Secondary | ICD-10-CM | POA: Insufficient documentation

## 2020-01-05 MED ORDER — LIDOCAINE HCL 2 % IJ SOLN
20.0000 mL | Freq: Once | INTRAMUSCULAR | Status: AC
  Administered 2020-01-05: 200 mg
  Filled 2020-01-05: qty 10

## 2020-01-05 MED ORDER — ROPIVACAINE HCL 2 MG/ML IJ SOLN
1.0000 mL | Freq: Once | INTRAMUSCULAR | Status: AC
  Administered 2020-01-05: 1 mL via EPIDURAL

## 2020-01-05 MED ORDER — OXYCODONE HCL 10 MG PO TABS: 10.0000 mg | ORAL_TABLET | Freq: Three times a day (TID) | ORAL | 0 refills | Status: DC

## 2020-01-05 MED ORDER — MIDAZOLAM HCL 5 MG/5ML IJ SOLN
1.0000 mg | INTRAMUSCULAR | Status: DC | PRN
  Administered 2020-01-05: 1 mg via INTRAVENOUS
  Administered 2020-01-05: 2 mg via INTRAVENOUS
  Filled 2020-01-05: qty 5

## 2020-01-05 MED ORDER — ROPIVACAINE HCL 2 MG/ML IJ SOLN
2.0000 mL | Freq: Once | INTRAMUSCULAR | Status: AC
  Administered 2020-01-05: 2 mL via EPIDURAL
  Filled 2020-01-05: qty 10

## 2020-01-05 MED ORDER — SODIUM CHLORIDE (PF) 0.9 % IJ SOLN
INTRAMUSCULAR | Status: AC
  Filled 2020-01-05: qty 10

## 2020-01-05 MED ORDER — SODIUM CHLORIDE 0.9% FLUSH
2.0000 mL | Freq: Once | INTRAVENOUS | Status: AC
  Administered 2020-01-05: 2 mL

## 2020-01-05 MED ORDER — DEXAMETHASONE SODIUM PHOSPHATE 10 MG/ML IJ SOLN
INTRAMUSCULAR | Status: AC
  Filled 2020-01-05: qty 1

## 2020-01-05 MED ORDER — FENTANYL CITRATE (PF) 100 MCG/2ML IJ SOLN
25.0000 ug | INTRAMUSCULAR | Status: DC | PRN
  Administered 2020-01-05: 50 ug via INTRAVENOUS
  Filled 2020-01-05: qty 2

## 2020-01-05 MED ORDER — LACTATED RINGERS IV SOLN
1000.0000 mL | Freq: Once | INTRAVENOUS | Status: AC
  Administered 2020-01-05: 1000 mL via INTRAVENOUS

## 2020-01-05 MED ORDER — DEXAMETHASONE SODIUM PHOSPHATE 10 MG/ML IJ SOLN
10.0000 mg | Freq: Once | INTRAMUSCULAR | Status: AC
  Administered 2020-01-05: 10 mg

## 2020-01-05 MED ORDER — IOHEXOL 180 MG/ML  SOLN
10.0000 mL | Freq: Once | INTRAMUSCULAR | Status: AC
  Administered 2020-01-05: 10 mL via EPIDURAL
  Filled 2020-01-05: qty 20

## 2020-01-05 MED ORDER — SODIUM CHLORIDE 0.9% FLUSH
1.0000 mL | Freq: Once | INTRAVENOUS | Status: AC
  Administered 2020-01-05: 1 mL

## 2020-01-05 MED ORDER — TRIAMCINOLONE ACETONIDE 40 MG/ML IJ SUSP
40.0000 mg | Freq: Once | INTRAMUSCULAR | Status: AC
  Administered 2020-01-05: 40 mg
  Filled 2020-01-05: qty 1

## 2020-01-05 NOTE — Progress Notes (Signed)
Safety precautions to be maintained throughout the outpatient stay will include: orient to surroundings, keep bed in low position, maintain call bell within reach at all times, provide assistance with transfer out of bed and ambulation.  

## 2020-01-05 NOTE — Progress Notes (Signed)
PROVIDER NOTE: Information contained herein reflects review and annotations entered in association with encounter. Interpretation of such information and data should be left to medically-trained personnel. Information provided to patient can be located elsewhere in the medical record under "Patient Instructions". Document created using STT-dictation technology, any transcriptional errors that may result from process are unintentional.    Patient: Howard Murphy  Service Category: Procedure  Provider: Gaspar Cola, MD  DOB: [birth year]  DOS: 01/05/2020  Location: [facility]  MRN: [id number]  Setting: Ambulatory - outpatient  Referring Provider: Milinda Pointer, MD  Type: Established Patient  Specialty: Interventional Pain Management  PCP: [facility]   Primary Reason for Visit: Interventional Pain Management Treatment. CC: Back Pain (low)  Procedure #1:  Anesthesia, Analgesia, Anxiolysis:  Type: Therapeutic Trans-Foraminal Epidural Steroid Injection   #1  Region: Lumbar Level: L1 Paravertebral Laterality: Right Paravertebral   Type: Moderate (Conscious) Sedation combined with Local Anesthesia Indication(s): Analgesia and Anxiety Route: Intravenous (IV) IV Access: Secured Sedation: Meaningful verbal contact was maintained at all times during the procedure  Local Anesthetic: Lidocaine 1-2%  Position: Prone  Procedure #2:    Type: Therapeutic Inter-Laminar Epidural Steroid Injection   #1  Region: Lumbar Level: L1-2 Level. Laterality: Midline             Indications: 1. Lumbosacral radiculopathy at L1 (Right)   2. DDD (degenerative disc disease), lumbosacral   3. Grade 1 Retrolisthesis of L1/L2   4. Lumbar disc extrusion with 7 mm caudal migration (L1-2)   5. Lumbar foraminal stenosis  (Left: L4-5) (Right: L1-2)   6. 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 Groin pain (Right)   8. Acute exacerbation of chronic low back  pain   9. Failed back surgical syndrome 4   10. Abnormal MRI, lumbar spine (12/23/2019)    Pain Score: Pre-procedure: 8 /10 Post-procedure: 0-No pain/10   Pre-op Assessment:  Howard Murphy is a 64 y.o. (year old), male patient, seen today for interventional treatment. He  has a past surgical history that includes Cholecystectomy (2000); Shoulder surgery (Right, 2016); Colonoscopy with propofol (N/A, 04/11/2015); Esophagogastroduodenoscopy (egd) with propofol (N/A, 04/11/2015); Shoulder surgery (Left, 06/2015); Back surgery (2015); Back surgery (08/09/2016); Cystoscopy with ureteroscopy and stent placement (Right, 08/27/2017); laparoscopic appendectomy (N/A, 12/18/2017); Appendectomy; and Back surgery (Bilateral, 2019). Howard Murphy has a current medication list which includes the following prescription(s): albuterol, atorvastatin, canagliflozin, chlorthalidone, cholecalciferol, cyclobenzaprine, lisinopril, meclizine, melatonin, ondansetron, [START ON 02/01/2020] oxycodone hcl, [START ON 03/02/2020] oxycodone hcl, potassium chloride, tamsulosin, testosterone cypionate, and oxycodone hcl, and the following Facility-Administered Medications: fentanyl and midazolam. His primarily concern today is the Back Pain (low)  Initial Vital Signs:  Pulse/HCG Rate: (!) 110ECG Heart Rate: 95 Temp: 98.3 F (36.8 C) Resp: 18 BP: (!) 125/96 SpO2: 97 %  BMI: Estimated body mass index is 30.34 kg/m as calculated from the following:   Height as of this encounter: 5\' 6"  (1.676 m).   Weight as of this encounter: 188 lb (85.3 kg).  Risk Assessment: Allergies: Reviewed. He is allergic to adhesive [tape].  Allergy Precautions: None required Coagulopathies: Reviewed. None identified.  Blood-thinner therapy: None at this time Active Infection(s): Reviewed. None identified. Howard Murphy is afebrile  Site Confirmation: Howard Murphy was asked to confirm the procedure and laterality before marking the site Procedure  checklist: Completed Consent: Before the procedure and under the influence of no sedative(s), amnesic(s), or anxiolytics, the patient was informed of the treatment  options, risks and possible complications. To fulfill our ethical and legal obligations, as recommended by the American Medical Association's Code of Ethics, I have informed the patient of my clinical impression; the nature and purpose of the treatment or procedure; the risks, benefits, and possible complications of the intervention; the alternatives, including doing nothing; the risk(s) and benefit(s) of the alternative treatment(s) or procedure(s); and the risk(s) and benefit(s) of doing nothing. The patient was provided information about the general risks and possible complications associated with the procedure. These may include, but are not limited to: failure to achieve desired goals, infection, bleeding, organ or nerve damage, allergic reactions, paralysis, and death. In addition, the patient was informed of those risks and complications associated to Spine-related procedures, such as failure to decrease pain; infection (i.e.: Meningitis, epidural or intraspinal abscess); bleeding (i.e.: epidural hematoma, subarachnoid hemorrhage, or any other type of intraspinal or peri-dural bleeding); organ or nerve damage (i.e.: Any type of peripheral nerve, nerve root, or spinal cord injury) with subsequent damage to sensory, motor, and/or autonomic systems, resulting in permanent pain, numbness, and/or weakness of one or several areas of the body; allergic reactions; (i.e.: anaphylactic reaction); and/or death. Furthermore, the patient was informed of those risks and complications associated with the medications. These include, but are not limited to: allergic reactions (i.e.: anaphylactic or anaphylactoid reaction(s)); adrenal axis suppression; blood sugar elevation that in diabetics may result in ketoacidosis or comma; water retention that in patients  with history of congestive heart failure may result in shortness of breath, pulmonary edema, and decompensation with resultant heart failure; weight gain; swelling or edema; medication-induced neural toxicity; particulate matter embolism and blood vessel occlusion with resultant organ, and/or nervous system infarction; and/or aseptic necrosis of one or more joints. Finally, the patient was informed that Medicine is not an exact science; therefore, there is also the possibility of unforeseen or unpredictable risks and/or possible complications that may result in a catastrophic outcome. The patient indicated having understood very clearly. We have given the patient no guarantees and we have made no promises. Enough time was given to the patient to ask questions, all of which were answered to the patient's satisfaction. Howard Murphy has indicated that he wanted to continue with the procedure. Attestation: I, the ordering provider, attest that I have discussed with the patient the benefits, risks, side-effects, alternatives, likelihood of achieving goals, and potential problems during recovery for the procedure that I have provided informed consent. Date  Time: 01/05/2020  9:54 AM  Pre-Procedure Preparation:  Monitoring: As per clinic protocol. Respiration, ETCO2, SpO2, BP, heart rate and rhythm monitor placed and checked for adequate function Safety Precautions: Patient was assessed for positional comfort and pressure points before starting the procedure. Time-out: I initiated and conducted the "Time-out" before starting the procedure, as per protocol. The patient was asked to participate by confirming the accuracy of the "Time Out" information. Verification of the correct person, site, and procedure were performed and confirmed by me, the nursing staff, and the patient. "Time-out" conducted as per Joint Commission's Universal Protocol (UP.01.01.01). Time: 1025  Description of Procedure #1:  Target Area: The  inferior and lateral portion of the pedicle, just lateral to a line created by the 6:00 position of the pedicle and the superior articular process of the vertebral body below. On the lateral view, this target lies just posterior to the anterior aspect of the lamina and posterior to the midpoint created between the anterior and the posterior aspect of the neural foramina.  Approach: Posterior paravertebral approach. Area Prepped: Entire Posterior Lumbosacral Region DuraPrep (Iodine Povacrylex [0.7% available iodine] and Isopropyl Alcohol, 74% w/w) Safety Precautions: Aspiration looking for blood return was conducted prior to all injections. At no point did we inject any substances, as a needle was being advanced. No attempts were made at seeking any paresthesias. Safe injection practices and needle disposal techniques used. Medications properly checked for expiration dates. SDV (single dose vial) medications used.  Description of the Procedure: Protocol guidelines were followed. The patient was placed in position over the fluoroscopy table. The target area was identified and the area prepped in the usual manner. Skin & deeper tissues infiltrated with local anesthetic. Appropriate amount of time allowed to pass for local anesthetics to take effect. The procedure needles were then advanced to the target area. Proper needle placement secured. Negative aspiration confirmed. Solution injected in intermittent fashion, asking for systemic symptoms every 0.2cc of injectate. The needles were then removed and the area cleansed, making sure to leave some of the prepping solution back to take advantage of its long term bactericidal properties.  Start Time: 1025 hrs.  Materials:  Needle(s) Type: Spinal Needle Gauge: 22G Length: 3.5-in Medication(s): Please see orders for medications and dosing details.  Description of Procedure #2:  Target Area: The  interlaminar space, initially targeting the lower border of the  superior vertebral body lamina. Approach: Posterior paramedial approach. Area Prepped: Same as above Prepping solution: Same as above Safety Precautions: Same as above  Description of the Procedure: Protocol guidelines were followed. The patient was placed in position over the fluoroscopy table. The target area was identified and the area prepped in the usual manner. Skin & deeper tissues infiltrated with local anesthetic. Appropriate amount of time allowed to pass for local anesthetics to take effect. The procedure needle was introduced through the skin, ipsilateral to the reported pain, and advanced to the target area. Bone was contacted and the needle walked caudad, until the lamina was cleared. The ligamentum flavum was engaged and loss-of-resistance technique used as the epidural needle was advanced. The epidural space was identified using "loss-of-resistance technique" with 2-3 ml of PF-NaCl (0.9% NSS), in a 5cc LOR glass syringe. Proper needle placement secured. Negative aspiration confirmed. Solution injected in intermittent fashion, asking for systemic symptoms every 0.5cc of injectate. The needles were then removed and the area cleansed, making sure to leave some of the prepping solution back to take advantage of its long term bactericidal properties.  Vitals:   01/05/20 1039 01/05/20 1049 01/05/20 1059 01/05/20 1110  BP: (!) 119/93 114/83 120/82 (!) 121/90  Pulse: (!) 108     Resp: 16 16 16 16   Temp:  98.3 F (36.8 C)  98.5 F (36.9 C)  SpO2: 93% 93% 94% 95%  Weight:      Height:        End Time: 1037 hrs.  Materials:  Needle(s) Type: Epidural needle Gauge: 17G Length: 3.5-in Medication(s): Please see orders for medications and dosing details.  Imaging Guidance (Spinal):          Type of Imaging Technique: Fluoroscopy Guidance (Spinal) Indication(s): Assistance in needle guidance and placement for procedures requiring needle placement in or near specific anatomical locations  not easily accessible without such assistance. Exposure Time: Please see nurses notes. Contrast: Before injecting any contrast, we confirmed that the patient did not have an allergy to iodine, shellfish, or radiological contrast. Once satisfactory needle placement was completed at the desired level, radiological contrast was injected.  Contrast injected under live fluoroscopy. No contrast complications. See chart for type and volume of contrast used. Fluoroscopic Guidance: I was personally present during the use of fluoroscopy. "Tunnel Vision Technique" used to obtain the best possible view of the target area. Parallax error corrected before commencing the procedure. "Direction-depth-direction" technique used to introduce the needle under continuous pulsed fluoroscopy. Once target was reached, antero-posterior, oblique, and lateral fluoroscopic projection used confirm needle placement in all planes. Images permanently stored in EMR. Interpretation: I personally interpreted the imaging intraoperatively. Adequate needle placement confirmed in multiple planes. Appropriate spread of contrast into desired area was observed. No evidence of afferent or efferent intravascular uptake. No intrathecal or subarachnoid spread observed. Permanent images saved into the patient's record.  Antibiotic Prophylaxis:   Anti-infectives (From admission, onward)   None     Indication(s): None identified  Post-operative Assessment:  Post-procedure Vital Signs:  Pulse/HCG Rate: (!) 10885 Temp: 98.5 F (36.9 C) Resp: 16 BP: (!) 121/90 SpO2: 95 %  EBL: None  Complications: No immediate post-treatment complications observed by team, or reported by patient.  Note: The patient tolerated the entire procedure well. A repeat set of vitals were taken after the procedure and the patient was kept under observation following institutional policy, for this type of procedure. Post-procedural neurological assessment was performed,  showing return to baseline, prior to discharge. The patient was provided with post-procedure discharge instructions, including a section on how to identify potential problems. Should any problems arise concerning this procedure, the patient was given instructions to immediately contact us, at any time, without hesitation. In any case, we plan to contact the patient by telephone for a follow-up status report regarding this interventional procedure.  Comments:  No additional relevant information.  The patient was reminded to keep an eye on his blood sugar as the steroids will bring it up for at least 2 weeks.  He understood and accepted and he indicated that he would be checking it every day.  Plan of Care  Orders:  Orders Placed This Encounter  Procedures  . Lumbar Epidural Injection    Scheduling Instructions:     Procedure: Interlaminar LESI L1-2     Laterality: Right-sided     Sedation: Patient's choice     Timeframe:  Today    Order Specific Question:   Where will this procedure be performed?    Answer:   [facility]  . Lumbar Transforaminal Epidural    Scheduling Instructions:     Side: Right-sided     Level: L1     Sedation: Patient's choice.     Timeframe: Today    Order Specific Question:   Where will this procedure be performed?    Answer:   [facility]  . DG [facility] C-ARM 1-60 MIN NO REPORT    Intraoperative interpretation by procedural physician at [place].    Standing Status:   Standing    Number of Occurrences:   1    Order Specific Question:   Reason for exam:    Answer:   Assistance in needle guidance and placement for procedures requiring needle placement in or near specific anatomical locations not easily accessible without such assistance.  . Informed Consent Details: Physician/Practitioner Attestation; Transcribe to consent form and obtain patient signature    Provider Attestation: I, [place] Dossie Arbour, MD, (Pain Management  Specialist), the physician/practitioner, attest that I have discussed with the patient the benefits, risks, side effects, alternatives, likelihood of achieving goals and  potential problems during recovery for the procedure that I have provided informed consent.    Scheduling Instructions:     Procedure: Lumbar epidural steroid injection under fluoroscopic guidance     Indications: Low back and/or lower extremity pain secondary to lumbar radiculitis     Note: Always confirm laterality of pain with Howard Murphy, before procedure.     Transcribe to consent form and obtain patient signature.  . Provide equipment / supplies at bedside    Equipment required: Single use, disposable, "Epidural Tray" Epidural Catheter: NOT required    Standing Status:   Standing    Number of Occurrences:   1    Order Specific Question:   Specify    Answer:   Epidural Tray  . Informed Consent Details: Physician/Practitioner Attestation; Transcribe to consent form and obtain patient signature    Provider Attestation: I, [place] Dossie Arbour, MD, (Pain Management Specialist), the physician/practitioner, attest that I have discussed with the patient the benefits, risks, side effects, alternatives, likelihood of achieving goals and potential problems during recovery for the procedure that I have provided informed consent.    Scheduling Instructions:     Procedure: Diagnostic lumbar transforaminal epidural steroid injection under fluoroscopic guidance. (See notes for level and laterality.)     Indication/Reason: Lumbar radiculopathy/radiculitis associated with lumbar stenosis     Note: Always confirm laterality of pain with Howard Murphy, before procedure.     Transcribe to consent form and obtain patient signature.   Chronic Opioid Analgesic:  Oxycodone IR 10 mg 1 tab PO q 8 hrs (30 mg/day of oxycodone) MME/day:45mg /day.   Medications ordered for procedure: Meds ordered this encounter  Medications  . iohexol (OMNIPAQUE)  180 MG/ML injection 10 mL    Must be Myelogram-compatible. If not available, you may substitute with a water-soluble, non-ionic, hypoallergenic, myelogram-compatible radiological contrast medium.  Marland Kitchen lidocaine (XYLOCAINE) 2 % (with pres) injection 400 mg  . lactated ringers infusion 1,000 mL  . midazolam (VERSED) 5 MG/5ML injection 1-2 mg    Make sure Flumazenil is available in the pyxis when using this medication. If oversedation occurs, administer 0.2 mg IV over 15 sec. If after 45 sec no response, administer 0.2 mg again over 1 min; may repeat at 1 min intervals; not to exceed 4 doses (1 mg)  . fentaNYL (SUBLIMAZE) injection 25-50 mcg    Make sure Narcan is available in the pyxis when using this medication. In the event of respiratory depression (RR< 8/min): Titrate NARCAN (naloxone) in increments of 0.1 to 0.2 mg IV at 2-3 minute intervals, until desired degree of reversal.  . sodium chloride flush (NS) 0.9 % injection 2 mL  . ropivacaine (PF) 2 mg/mL (0.2%) (NAROPIN) injection 2 mL  . triamcinolone acetonide (KENALOG-40) injection 40 mg  . sodium chloride flush (NS) 0.9 % injection 1 mL  . ropivacaine (PF) 2 mg/mL (0.2%) (NAROPIN) injection 1 mL  . dexamethasone (DECADRON) injection 10 mg  . Oxycodone HCl 10 MG TABS    Sig: Take 1 tablet (10 mg total) by mouth every 8 (eight) hours. Must last 30 days.    Dispense:  90 tablet    Refill:  0    Chronic Pain: STOP Act (Not applicable) Fill 1 day early if closed on refill date. Do not fill until: 01/02/2020. To last until: 02/01/2020. Avoid benzodiazepines within 8 hours of opioids   Medications administered: We administered iohexol, lidocaine, lactated ringers, midazolam, fentaNYL, sodium chloride flush, ropivacaine (PF) 2 mg/mL (0.2%), triamcinolone acetonide,  sodium chloride flush, ropivacaine (PF) 2 mg/mL (0.2%), and dexamethasone.  See the medical record for exact dosing, route, and time of administration.  Follow-up plan:   Return in  about 2 weeks (around 01/19/2020) for 20-min, F2F, PP-(on eval day).       Interventional management options: Planned, scheduled, and/or pending:      Considering:   Diagnostic bilateral cervical facet block Possible bilateral cervical facet RFA  Diagnostic right greater occipital NB Possible right GON RFA  Possible right GON peripheral nerve stimulator trial  Diagnostic right CESI  Diagnostic bilateral IA shoulder injection Diagnostic bilateral suprascapular NB Possible bilateral suprascapular nerve RFA  Diagnostic bilateral SI joint block Possible bilateral SI joint RFA  Diagnostic bilateral lumbar facet block Possible bilateral lumbar facet RFA  Diagnostic left L4 TFESI  Diagnostic left L4-5 LESI  Diagnostic left IA hip joint injection Possible left hip joint RFA  Diagnostic bilateral median nerve block(carpal tunnel block)  Diagnostic right CPB   Palliative PRN treatment(s):   Palliative/Diagnostic left SI Block (Area of L5 Transverse process spatulation) #2  Palliative IM Toradol/Norflex 60/60 (PRN)       Recent Visits Date Type Provider Dept  12/30/19 Office Visit Milinda Pointer, MD [facility]  12/03/19 Office Visit Milinda Pointer, MD [facility]  Showing recent visits within past 90 days and meeting all other requirements Today's Visits Date Type Provider Dept  01/05/20 Procedure visit Milinda Pointer, MD [facility]  Showing today's visits and meeting all other requirements Future Appointments Date Type Provider Dept  02/03/20 Appointment Milinda Pointer, MD [facility]  02/22/20 Appointment Milinda Pointer, MD [facility]  03/16/20 Appointment Milinda Pointer, MD [facility]  Showing future appointments within next 90 days and meeting all other requirements  Disposition: Discharge home  Discharge (Date  Time): 01/05/2020; 1111 hrs.   Primary Care Physician: [facility] [place] Location: [facility] Note by: Gaspar Cola, MD Date: 01/05/2020; Time: 11:57 AM  Disclaimer:  Medicine is not an exact science. The only guarantee in medicine is that nothing is guaranteed. It is important to note that the decision to proceed with this intervention was based on the information collected from the patient. The Data and conclusions were drawn from the patient's questionnaire, the interview, and the physical examination. Because the information was provided in large part by the patient, it cannot be guaranteed that it has not been purposely or unconsciously manipulated. Every effort has been made to obtain as much relevant data as possible for this evaluation. It is important to note that the conclusions that lead to this procedure are derived in large part from the available data. Always take into account that the treatment will also be dependent on availability of resources and existing treatment guidelines, considered by other Pain Management Practitioners as being common knowledge and practice, at the time of the intervention. For Medico-Legal purposes, it is also important to point out that variation in procedural techniques and pharmacological choices are the acceptable norm. The indications, contraindications, technique, and results of the above procedure should only be interpreted and judged by a Board-Certified Interventional Pain Specialist with extensive familiarity and expertise in the same exact procedure and technique.

## 2020-01-05 NOTE — Patient Instructions (Signed)

## 2020-01-06 ENCOUNTER — Telehealth: Payer: Self-pay | Admitting: *Deleted

## 2020-01-06 NOTE — Telephone Encounter (Signed)
No problems post procedure. 

## 2020-01-11 DIAGNOSIS — M5441 Lumbago with sciatica, right side: Secondary | ICD-10-CM | POA: Diagnosis not present

## 2020-01-11 DIAGNOSIS — G8929 Other chronic pain: Secondary | ICD-10-CM | POA: Diagnosis not present

## 2020-01-19 ENCOUNTER — Telehealth: Payer: Self-pay | Admitting: *Deleted

## 2020-01-19 NOTE — Telephone Encounter (Signed)
Recently had a procedure on 01-05-20. Patient informed he must have a follow-up appt after that procedure before another procedure may be done. Instructed him to complete the Post Procedure Diary and bring it to the follow-up appt.

## 2020-01-20 DIAGNOSIS — F411 Generalized anxiety disorder: Secondary | ICD-10-CM | POA: Diagnosis not present

## 2020-01-20 DIAGNOSIS — E1165 Type 2 diabetes mellitus with hyperglycemia: Secondary | ICD-10-CM | POA: Diagnosis not present

## 2020-01-29 DIAGNOSIS — G8929 Other chronic pain: Secondary | ICD-10-CM | POA: Diagnosis not present

## 2020-01-29 DIAGNOSIS — M5441 Lumbago with sciatica, right side: Secondary | ICD-10-CM | POA: Diagnosis not present

## 2020-02-01 ENCOUNTER — Telehealth: Payer: Self-pay | Admitting: *Deleted

## 2020-02-01 DIAGNOSIS — G894 Chronic pain syndrome: Secondary | ICD-10-CM

## 2020-02-01 DIAGNOSIS — I10 Essential (primary) hypertension: Secondary | ICD-10-CM | POA: Diagnosis not present

## 2020-02-01 DIAGNOSIS — E1165 Type 2 diabetes mellitus with hyperglycemia: Secondary | ICD-10-CM | POA: Diagnosis not present

## 2020-02-01 DIAGNOSIS — E291 Testicular hypofunction: Secondary | ICD-10-CM | POA: Diagnosis not present

## 2020-02-01 MED ORDER — OXYCODONE HCL 10 MG PO TABS: 10.0000 mg | ORAL_TABLET | Freq: Three times a day (TID) | ORAL | 0 refills | Status: DC

## 2020-02-01 NOTE — Telephone Encounter (Signed)
Requested Prescriptions   Signed Prescriptions Disp Refills  . Oxycodone HCl 10 MG TABS 90 tablet 0    Sig: Take 1 tablet (10 mg total) by mouth every 8 (eight) hours. Must last 30 days.    Authorizing Provider: Gillis Santa  . Oxycodone HCl 10 MG TABS 90 tablet 0    Sig: Take 1 tablet (10 mg total) by mouth every 8 (eight) hours. Must last 30 days.    Authorizing Provider: Gillis Santa     PMP checked. Call and cancel previous Rx at [facility]

## 2020-02-01 NOTE — Telephone Encounter (Signed)
Oxycodone 10 mg at [facility] have been cancelled and the Rx have been resent to [facility].

## 2020-02-01 NOTE — Telephone Encounter (Signed)
Spoke with patient and informed him that he has a script that can be filled today according to Epic.  Instructed him to call the pharmacy back and if there was any further problems to notify us.

## 2020-02-01 NOTE — Telephone Encounter (Signed)
Patient has found his medication at [facility]  Needs Rx for today and next month sent there instead of [facility].

## 2020-02-03 ENCOUNTER — Encounter: Payer: Self-pay | Admitting: Pain Medicine

## 2020-02-03 ENCOUNTER — Ambulatory Visit: Payer: Medicare Other | Attending: Pain Medicine | Admitting: Pain Medicine

## 2020-02-03 ENCOUNTER — Other Ambulatory Visit: Payer: Self-pay

## 2020-02-03 VITALS — BP 134/84 | HR 79 | Temp 97.7°F | Resp 16 | Ht 66.0 in | Wt 185.0 lb

## 2020-02-03 DIAGNOSIS — Z79899 Other long term (current) drug therapy: Secondary | ICD-10-CM

## 2020-02-03 DIAGNOSIS — G4701 Insomnia due to medical condition: Secondary | ICD-10-CM | POA: Diagnosis present

## 2020-02-03 DIAGNOSIS — M9973 Connective tissue and disc stenosis of intervertebral foramina of lumbar region: Secondary | ICD-10-CM | POA: Diagnosis not present

## 2020-02-03 DIAGNOSIS — M5417 Radiculopathy, lumbosacral region: Secondary | ICD-10-CM

## 2020-02-03 DIAGNOSIS — M4310 Spondylolisthesis, site unspecified: Secondary | ICD-10-CM

## 2020-02-03 DIAGNOSIS — G8929 Other chronic pain: Secondary | ICD-10-CM

## 2020-02-03 DIAGNOSIS — G894 Chronic pain syndrome: Secondary | ICD-10-CM | POA: Diagnosis present

## 2020-02-03 DIAGNOSIS — Z79891 Long term (current) use of opiate analgesic: Secondary | ICD-10-CM | POA: Diagnosis present

## 2020-02-03 DIAGNOSIS — F1120 Opioid dependence, uncomplicated: Secondary | ICD-10-CM

## 2020-02-03 DIAGNOSIS — M5136 Other intervertebral disc degeneration, lumbar region: Secondary | ICD-10-CM

## 2020-02-03 DIAGNOSIS — M48061 Spinal stenosis, lumbar region without neurogenic claudication: Secondary | ICD-10-CM

## 2020-02-03 DIAGNOSIS — M5126 Other intervertebral disc displacement, lumbar region: Secondary | ICD-10-CM | POA: Diagnosis present

## 2020-02-03 MED ORDER — MORPHINE SULFATE ER 15 MG PO TBCR: 15.0000 mg | EXTENDED_RELEASE_TABLET | Freq: Three times a day (TID) | ORAL | 0 refills | Status: DC

## 2020-02-03 MED ORDER — MELATONIN 10 MG PO CAPS: 20.0000 mg | ORAL_CAPSULE | Freq: Every day | ORAL | 5 refills | Status: DC

## 2020-02-03 NOTE — Progress Notes (Signed)
PROVIDER NOTE: Information contained herein reflects review and annotations entered in association with encounter. Interpretation of such information and data should be left to medically-trained personnel. Information provided to patient can be located elsewhere in the medical record under "Patient Instructions". Document created using STT-dictation technology, any transcriptional errors that may result from process are unintentional.    Patient: Howard Murphy  Service Category: E/M  Provider: Gaspar Cola, MD  DOB: [birth year]  DOS: 02/03/2020  Specialty: Interventional Pain Management  MRN: [id number]  Setting: Ambulatory outpatient  PCP: [facility], [place]  Type: Established Patient    Referring Provider: [facility], [place]*  Location: Office  Delivery: Face-to-face     HPI  Reason for encounter: Howard Murphy, a 64 y.o. year old male, is here today for evaluation and management of his No primary diagnosis found.. Howard Murphy primary complain today is Back Pain (lower) Last encounter: Practice (02/01/2020). My last encounter with him was on 01/05/2020. Pertinent problems: Howard Murphy has History of lumbar facet Synovial cyst, surgically removed.; Chronic abdominal pain (RUQ); Type 2 diabetes mellitus with peripheral neuropathy ([place]); Spondylosis of lumbar spine; Lumbar facet joint syndrome ([place]); Chronic hip pain (Left); Diabetic peripheral neuropathy ([place]); Chronic lumbar radicular pain (S1 Dermatome) (Bilateral) (L>R); Complete tear of the shoulder rotator cuff (Left); Chronic shoulder pain (2ry area of Pain) (Bilateral) (L>R); Chronic foot pain (3ry area of Pain) (Bilateral) (L>R); Chronic hand pain (4th area of Pain) (Bilateral) (R>L); Lumbar facet hypertrophy; Occipital pain (Right); Failed back surgical syndrome 4; Chronic low back pain (1ry area of Pain) (Bilateral) (L>R) w/o sciatica; Radicular pain of shoulder (Right); Carpal tunnel syndrome (Bilateral); Chronic  cervical radicular pain (Right); Abnormal MRI, lumbar spine (12/23/2019); Chronic sacroiliac joint pain (Bilateral) (L>R); Neurogenic pain; Cervical spondylosis; Cervical facet arthropathy; Chronic neck pain (Bilateral) (R>L); Cervical facet syndrome (Bilateral) (R>L); Chronic upper extremity pain (Bilateral) (R>L); Cervical foraminal stenosis;  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, C5-6); Arthropathy of shoulder (Right); 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); Lumbar foraminal stenosis  (Left: L4-5) (Right: L1-2); Grade 1 Anterolisthesis (2 mm) of L4 over L5.; Lumbar facet (L4-5) synovial cyst (6 mm) (Left); Chronic pain syndrome; Spondylolisthesis at L3-L4 level; Complete tear of left rotator cuff; Bertolotti's syndrome (L5-S1) (Bilateral); 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); Other specified dorsopathies, sacral and sacrococcygeal region; Spondylosis without myelopathy or radiculopathy, lumbosacral region; Chronic bilateral low back pain with bilateral sciatica; Neurogenic claudication; Osteoarthritis involving multiple joints; Chronic musculoskeletal pain; Acute exacerbation of chronic low back pain; Grade 1 Retrolisthesis of L1/L2; 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 Pain Assessment: Severity of Chronic pain is reported as a 6 /10. Location: Back Lower, Right/back of right leg to knee. Onset: More than a month ago. Quality: Burning, Stabbing. Timing:  . Modifying factor(s): nothing. Vitals:  height is '5\' 6"'  (1.676 m) and weight is 185 lb (83.9 kg). His temporal temperature is 97.7 F (36.5 C). His blood pressure is 134/84 and his pulse is 79. His respiration is 16 and oxygen saturation is 97%.   The patient returns to the clinic today for follow-up after a right-sided  L1 transforaminal ESI #1+ midline L1-2 LESI #1 under fluoroscopic guidance and IV sedation.  On the day of the procedure the patient indicated that his pain was an 8/10 and after the procedure and  before he was discharged from the clinic he indicated that it was a 0/10.  According to our records the patient was experiencing absolutely no pain upon discharge from the clinic.  However, in the case of this patient in particular, this is not the first time where we get conflicting information from the patient.  Upon evaluation of the same procedure today he claims that he attained only 25% relief of the pain during the first hour after the procedure which is in direct conflict from the information that we obtained.  In addition, he also indicated that this same level of relief persisted for the initial 4 to 6 hours after the procedure.  Interpretation of this data would suggest the following: Because the same 25% relief lasted for a period of 4 to 6 hours, which is past the duration of the IV sedation, this would mean that he did not attain any relief whatsoever with the IV benzodiazepine or the IV opioid.  This by itself should be sufficient evidence to taper the patient off of the opioids since clearly they do not appear to be helping.  This would also mean that the injected area is only associated with 25% of the pain and there is an additional 75% that does not seem to be coming from this area or the area distal to where we injected.  However, my impression is that despite the fact that I keep explained in multiple locations to the patient the importance of providing [place] with accurate, unaltered information, he continues not to do so.  I feel that there is a significant component of symptom exaggeration associated with Mr. Capozzi's case, perhaps as an unconscious attempt to bring up and highlight his condition.  The problem with this is that he is unknowingly making it significantly harder for [place] to narrow down the  problem area.  This in turn could lead to prolongation of his situation due to the fact that he is tearing also away from the area that needs to be treated.  In any case, I believe that unconsciously he is making this very difficult to treat and the diagnostic procedures difficult to interpret.  This patient is currently on oxycodone IR 10 mg 1 tablet p.o. every 8 hours (30 mg/day of oxycodone) (45 MME).  Today we have informed him of the national shortage of this medication and we have decided to switch him to MS Contin.  Because he is taking 45 MME's of oxycodone, today we will be switching him to MS Contin 15 mg p.o. 3 times daily (45 MME).  In addition, interpreting the results of the recent procedure, as best as I can, I believe that based on the pathology seen on his imaging studies and the location of his pain, the previously treated area is the correct one.  Because of this, I have talked to the patient about perhaps repeating the injection for the purpose of comparing the first to the second and determining whether or not further therapies in that area may be of any benefit.  His later MRI confirms that he has a right-sided L1-2 disc extrusion, with migration and decrease in the diameter of the canal.  Should these treatments not improve the condition, I would highly recommend surgical intervention.  The patient understood and accepted.  Post-Procedure Evaluation  Procedure (01/05/2020): Diagnostic/therapeutic right-sided L1 transforaminal ESI #1 + midline L1-2 LESI #1 under fluoroscopic guidance and IV sedation Pre-procedure pain level: 8/10 Post-procedure: 0/10 (100% relief)  Sedation: Sedation provided.  Effectiveness during initial hour after procedure(Ultra-Short Term Relief): 25 %.  Local anesthetic used: Long-acting (4-6 hours) Effectiveness: Defined as any analgesic benefit obtained secondary to the administration of local anesthetics. This carries significant diagnostic value as to the  etiological location, or anatomical origin, of the pain. Duration of benefit is expected to coincide with the duration of the local anesthetic used.  Effectiveness during initial 4-6 hours after procedure(Short-Term Relief): 25 %.  Long-term benefit: Defined as any relief past the pharmacologic duration of the local anesthetics.  Effectiveness past the initial 6 hours after procedure(Long-Term Relief): 0 %.  Current benefits: Defined as benefit that persist at this time.   Analgesia:  Back to baseline Function: Back to baseline ROM: Back to baseline  Pharmacotherapy Assessment   Analgesic: Oxycodone IR 10 mg 1 tab PO q 8 hrs (30 mg/day of oxycodone) MME/day:22m/day.   Monitoring: [place] PMP: PDMP reviewed during this encounter.       Pharmacotherapy: No side-effects or adverse reactions reported. Compliance: No problems identified. Effectiveness: Clinically acceptable.  No notes on file  UDS:  Summary  Date Value Ref Range Status  12/02/2019 Note  Final    Comment:    ==================================================================== [employer] Select 13 (MW) ==================================================================== Test                             Result       Flag       Units  Drug Present and Declared for Prescription Verification   Oxycodone                      6169         EXPECTED   ng/mg creat   Oxymorphone                    2086         EXPECTED   ng/mg creat   Noroxycodone                   4079         EXPECTED   ng/mg creat   Noroxymorphone                 645          EXPECTED   ng/mg creat    Sources of oxycodone are scheduled prescription medications.    Oxymorphone, noroxycodone, and noroxymorphone are expected    metabolites of oxycodone. Oxymorphone is also available as a    scheduled prescription medication.  ==================================================================== Test                      Result    Flag   Units      Ref Range    Creatinine              141              mg/dL      >=20 ==================================================================== Declared Medications:  The flagging and interpretation on this report are based on the  following declared medications.  Unexpected results may arise from  inaccuracies in the declared medications.   **Note: The testing scope of this panel includes these medications:   Oxycodone   **Note: The testing scope of this panel does not include the  following reported medications:   Albuterol (Ventolin HFA)  Atorvastatin (Lipitor)  Canagliflozin (Invokana)  Chlorthalidone (Hygroton)  Lisinopril (Zestril)  Meclizine (Antivert)  Melatonin  Ondansetron (Zofran)  Potassium (Klor-Con)  Tamsulosin (Flomax)  Testosterone  Vitamin D ==================================================================== For clinical consultation, please call [phone]. ====================================================================      ROS  Constitutional: Denies any fever or chills Gastrointestinal: No reported hemesis, hematochezia, vomiting, or acute GI distress Musculoskeletal: Denies any acute onset joint swelling, redness, loss of ROM, or weakness Neurological: No reported episodes of acute onset apraxia, aphasia, dysarthria, agnosia, amnesia, paralysis, loss of coordination, or loss of consciousness  Medication Review  Melatonin, Oxycodone HCl, albuterol, atorvastatin, canagliflozin, chlorthalidone, cholecalciferol, cyclobenzaprine, lisinopril, meclizine, ondansetron, potassium chloride, tamsulosin, and testosterone cypionate  History Review  Allergy: Howard Murphy is allergic to adhesive [tape]. Drug: Howard Murphy  reports no history of drug use. Alcohol:  reports no history of alcohol use. Tobacco:  reports that he has never smoked. He quit smokeless tobacco use about 7 years ago.  His smokeless tobacco use included chew. Social: Howard Murphy  reports that he has  never smoked. He quit smokeless tobacco use about 7 years ago.  His smokeless tobacco use included chew. He reports that he does not drink alcohol and does not use drugs. Medical:  has a past medical history of Arthritis, Back ache (05/05/2013), Diabetes mellitus without complication ([place]), GERD (gastroesophageal reflux disease), Headache, History of kidney stones, Hyperlipemia, Hypertension, Kidney stones, Kidney stones, Nephrolithiasis, and Shortness of breath dyspnea. Surgical: Howard Murphy  has a past surgical history that includes Cholecystectomy (2000); Shoulder surgery (Right, 2016); Colonoscopy with propofol (N/A, 04/11/2015); Esophagogastroduodenoscopy (egd) with propofol (N/A, 04/11/2015); Shoulder surgery (Left, 06/2015); Back surgery (2015); Back surgery (08/09/2016); Cystoscopy with ureteroscopy and stent placement (Right, 08/27/2017); laparoscopic appendectomy (N/A, 12/18/2017); Appendectomy; and Back surgery (Bilateral, 2019). Family: family history includes Cancer in his maternal grandmother, mother, sister, and sister; Heart disease in his father; Stroke in his father.  Laboratory Chemistry Profile   Renal Lab Results  Component Value Date   BUN 16 12/22/2019   CREATININE 1.36 (H) 12/22/2019   LABCREA 182 02/24/2016   BCR 14 03/07/2017   GFRAA >60 12/22/2019   GFRNONAA 55 (L) 12/22/2019     Hepatic Lab Results  Component Value Date   AST 29 12/22/2019   ALT 36 12/22/2019   ALBUMIN 4.3 12/22/2019   ALKPHOS 68 12/22/2019   LIPASE 46 12/03/2018     Electrolytes Lab Results  Component Value Date   NA 138 12/22/2019   K 3.4 (L) 12/22/2019   CL 98 12/22/2019   CALCIUM 9.6 12/22/2019   MG 2.0 11/29/2016     Bone Lab Results  Component Value Date   25OHVITD1 24 (L) 11/29/2016   25OHVITD2 <1.0 11/29/2016   25OHVITD3 24 11/29/2016     Inflammation (CRP: Acute Phase) (ESR: Chronic Phase) Lab Results  Component Value Date   CRP 1.8 11/29/2016   ESRSEDRATE 11  11/29/2016       Note: Above Lab results reviewed.  Recent Imaging Review  DG PAIN CLINIC C-ARM 1-60 MIN NO REPORT Fluoro was used, but no Radiologist interpretation will be provided.  Please refer to "NOTES" tab for provider progress note. Note: Reviewed        Physical Exam  General appearance: Well nourished, well developed, and well hydrated. In no apparent acute distress Mental status: Alert, oriented x 3 (person, place, & time)       Respiratory: No evidence of acute respiratory distress Eyes: PERLA Vitals: BP 134/84 (BP Location: Right Arm, Patient Position: Sitting, Cuff Size: Normal)    Pulse  79    Temp 97.7 F (36.5 C) (Temporal)    Resp 16    Ht '5\' 6"'  (1.676 m)    Wt 185 lb (83.9 kg)    SpO2 97%    BMI 29.86 kg/m  BMI: Estimated body mass index is 29.86 kg/m as calculated from the following:   Height as of this encounter: '5\' 6"'  (1.676 m).   Weight as of this encounter: 185 lb (83.9 kg). Ideal: Ideal body weight: 63.8 kg (140 lb 10.5 oz) Adjusted ideal body weight: 71.8 kg (158 lb 6.3 oz)  Assessment   Status Diagnosis  Controlled Controlled Controlled No diagnosis found.   Updated Problems: No problems updated.  Plan of Care  Problem-specific:  No problem-specific Assessment & Plan notes found for this encounter.  Howard Murphy has a current medication list which includes the following long-term medication(s): albuterol, atorvastatin, chlorthalidone, melatonin, oxycodone hcl, oxycodone hcl, [START ON 03/02/2020] oxycodone hcl, and potassium chloride.  Pharmacotherapy (Medications Ordered): No orders of the defined types were placed in this encounter.  Orders:  No orders of the defined types were placed in this encounter.  Follow-up plan:   No follow-ups on file.      Interventional management options: Planned, scheduled, and/or pending:      Considering:   Diagnostic bilateral cervical facet block Possible bilateral cervical facet RFA   Diagnostic right greater occipital NB Possible right GON RFA  Possible right GON peripheral nerve stimulator trial  Diagnostic right CESI  Diagnostic bilateral IA shoulder injection Diagnostic bilateral suprascapular NB Possible bilateral suprascapular nerve RFA  Diagnostic bilateral SI joint block Possible bilateral SI joint RFA  Diagnostic bilateral lumbar facet block Possible bilateral lumbar facet RFA  Diagnostic left L4 TFESI  Diagnostic left L4-5 LESI  Diagnostic left IA hip joint injection Possible left hip joint RFA  Diagnostic bilateral median nerve block(carpal tunnel block)  Diagnostic right CPB   Palliative PRN treatment(s):   Palliative/Diagnostic left SI Block (Area of L5 Transverse process spatulation) #2  Palliative IM Toradol/Norflex 60/60 (PRN)        Recent Visits Date Type Provider Dept  01/05/20 Procedure visit Milinda Pointer, MD [facility]  12/30/19 Office Visit Milinda Pointer, MD [facility]  12/03/19 Office Visit Milinda Pointer, MD [facility]  Showing recent visits within past 90 days and meeting all other requirements Today's Visits Date Type Provider Dept  02/03/20 Office Visit Milinda Pointer, MD [facility]  Showing today's visits and meeting all other requirements Future Appointments Date Type Provider Dept  03/16/20 Appointment Milinda Pointer, MD [facility]  Showing future appointments within next 90 days and meeting all other requirements  I discussed the assessment and treatment plan with the patient. The patient was provided an opportunity to ask questions and all were answered. The patient agreed with the plan and demonstrated an understanding of the instructions.  Patient advised to call back or seek an in-person evaluation if the symptoms or condition worsens.  Duration of encounter: 35 minutes.  Note by: Gaspar Cola, MD Date: 02/03/2020; Time: 1:43  PM

## 2020-02-03 NOTE — Patient Instructions (Addendum)
____________________________________________________________________________________________  Preparing for Procedure with Sedation  Procedure appointments are limited to planned procedures: . No Prescription Refills. . No disability issues will be discussed. . No medication changes will be discussed.  Instructions: . Oral Intake: Do not eat or drink anything for at least 8 hours prior to your procedure. (Exception: Blood Pressure Medication. See below.) . Transportation: Unless otherwise stated by your physician, you may drive yourself after the procedure. . Blood Pressure Medicine: Do not forget to take your blood pressure medicine with a sip of water the morning of the procedure. If your Diastolic (lower reading)is above 100 mmHg, elective cases will be cancelled/rescheduled. . Blood thinners: These will need to be stopped for procedures. Notify our staff if you are taking any blood thinners. Depending on which one you take, there will be specific instructions on how and when to stop it. . Diabetics on insulin: Notify the staff so that you can be scheduled 1st case in the morning. If your diabetes requires high dose insulin, take only  of your normal insulin dose the morning of the procedure and notify the staff that you have done so. . Preventing infections: Shower with an antibacterial soap the morning of your procedure. . Build-up your immune system: Take 1000 mg of Vitamin C with every meal (3 times a day) the day prior to your procedure. . Antibiotics: Inform the staff if you have a condition or reason that requires you to take antibiotics before dental procedures. . Pregnancy: If you are pregnant, call and cancel the procedure. . Sickness: If you have a cold, fever, or any active infections, call and cancel the procedure. . Arrival: You must be in the facility at least 30 minutes prior to your scheduled procedure. . Children: Do not bring children with you. . Dress appropriately:  Bring dark clothing that you would not mind if they get stained. . Valuables: Do not bring any jewelry or valuables.  Reasons to call and reschedule or cancel your procedure: (Following these recommendations will minimize the risk of a serious complication.) . Surgeries: Avoid having procedures within 2 weeks of any surgery. (Avoid for 2 weeks before or after any surgery). . Flu Shots: Avoid having procedures within 2 weeks of a flu shots or . (Avoid for 2 weeks before or after immunizations). . Barium: Avoid having a procedure within 7-10 days after having had a radiological study involving the use of radiological contrast. (Myelograms, Barium swallow or enema study). . Heart attacks: Avoid any elective procedures or surgeries for the initial 6 months after a "Myocardial Infarction" (Heart Attack). . Blood thinners: It is imperative that you stop these medications before procedures. Let us know if you if you take any blood thinner.  . Infection: Avoid procedures during or within two weeks of an infection (including chest colds or gastrointestinal problems). Symptoms associated with infections include: Localized redness, fever, chills, night sweats or profuse sweating, burning sensation when voiding, cough, congestion, stuffiness, runny nose, sore throat, diarrhea, nausea, vomiting, cold or Flu symptoms, recent or current infections. It is specially important if the infection is over the area that we intend to treat. . Heart and lung problems: Symptoms that may suggest an active cardiopulmonary problem include: cough, chest pain, breathing difficulties or shortness of breath, dizziness, ankle swelling, uncontrolled high or unusually low blood pressure, and/or palpitations. If you are experiencing any of these symptoms, cancel your procedure and contact your primary care physician for an evaluation.  Remember:  Regular Business hours are:    Monday to Thursday 8:00 AM to 4:00 PM  Provider's  Schedule: Milinda Pointer, MD:  Procedure days: Tuesday and Thursday 7:30 AM to 4:00 PM  Gillis Santa, MD:  Procedure days: Monday and Wednesday 7:30 AM to 4:00 PM ____________________________________________________________________________________________   Morphine sustained-release tablets What is this medicine? MORPHINE (MOR feen) is a pain reliever. It is used to treat moderate to severe pain that lasts for more than a few days. This medicine may be used for other purposes; ask your health care provider or pharmacist if you have questions. COMMON BRAND NAME(S): ARYMO ER, MORPHABOND, MS Contin, Oramorph SR What should I tell my health care provider before I take this medicine? They need to know if you have any of these conditions:  brain tumor  drug abuse or addiction  head injury  heart disease  if you often drink alcohol  liver disease  lung or breathing disease, like asthma  problems urinating  seizures  stomach or intestine problems  taken an MAOI like Carbex, Eldepryl, Marplan, Nardil, or Parnate in the last 14 days  an unusual or allergic reaction to morphine, other medications, foods, dyes, or preservatives  pregnant or trying to get pregnant  breast-feeding How should I use this medicine? Take this medicine by mouth with a glass of water. Do not break, crush, or chew the medicine. Do not take a tablet that is not whole. A broken or crushed tablet can be very dangerous. You may get too much medicine. If the medicine upsets your stomach, take it with food or milk. Follow the directions on the prescription label. Take the medicine at the same time each day. Do not take more medicine than you are told to take. A special MedGuide will be given to you by the pharmacist with each prescription and refill. Be sure to read this information carefully each time. Talk to your pediatrician regarding the use of this medicine in children. Special care may be  needed. Overdosage: If you think you have taken too much of this medicine contact a poison control center or emergency room at once. NOTE: This medicine is only for you. Do not share this medicine with others. What if I miss a dose? If you miss a dose, take it as soon as you can. If it is almost time for your next dose, take only that dose. Do not take double or extra doses. What may interact with this medicine? This medicine may interact with the following medications:  alcohol  antihistamines for allergy, cough and cold  atropine  certain medicines for anxiety or sleep  certain medicines for bladder problems like oxybutynin, tolterodine  certain medicines for depression like amitriptyline, fluoxetine, sertraline  certain medicines for Parkinson's disease like benztropine, trihexyphenidyl  certain medicines for seizures like phenobarbital, primidone  certain medicines for stomach problems like dicyclomine, hyoscyamine  certain medicines for travel sickness like scopolamine  cimetidine  general anesthetics like halothane, isoflurane, methoxyflurane, propofol  ipratropium  local anesthetics like lidocaine, pramoxine, tetracaine  MAOIs like Carbex, Eldepryl, Marplan, Nardil, and Parnate  medicines that relax muscles for surgery  other narcotic medicines for pain or cough  phenothiazines like chlorpromazine, mesoridazine, prochlorperazine, thioridazine This list may not describe all possible interactions. Give your health care provider a list of all the medicines, herbs, non-prescription drugs, or dietary supplements you use. Also tell them if you smoke, drink alcohol, or use illegal drugs. Some items may interact with your medicine. What should I watch for while using this medicine?  Tell your health care provider if your pain does not go away, if it gets worse, or if you have new or a different type of pain. You may develop tolerance to this drug. Tolerance means that you  will need a higher dose of the drug for pain relief. Tolerance is normal and is expected if you take this drug for a long time. There are different types of narcotic drugs (opioids) for pain. If you take more than one type at the same time, you may have more side effects. Give your health care provider a list of all drugs you use. He or she will tell you how much drug to take. Do not take more drug than directed. Get emergency help right away if you have problems breathing. Do not suddenly stop taking your drug because you may develop a severe reaction. Your body becomes used to the drug. This does NOT mean you are addicted. Addiction is a behavior related to getting and using a drug for a nonmedical reason. If you have pain, you have a medical reason to take pain drug. Your health care provider will tell you how much drug to take. If your health care provider wants you to stop the drug, the dose will be slowly lowered over time to avoid any side effects. Talk to your health care provider about naloxone and how to get it. Naloxone is an emergency drug used for an opioid overdose. An overdose can happen if you take too much opioid. It can also happen if an opioid is taken with some other drugs or substances, like alcohol. Know the symptoms of an overdose, like trouble breathing, unusually tired or sleepy, or not being able to respond or wake up. Make sure to tell caregivers and close contacts where it is stored. Make sure they know how to use it. After naloxone is given, you must get emergency help right away. Naloxone is a temporary treatment. Repeat doses may be needed. You may get drowsy or dizzy. Do not drive, use machinery, or do anything that needs mental alertness until you know how this drug affects you. Do not stand up or sit up quickly, especially if you are an older patient. This reduces the risk of dizzy or fainting spells. Alcohol may interfere with the effect of this drug. Avoid alcoholic  drinks. This drug will cause constipation. If you do not have a bowel movement for 3 days, call your health care provider. Your mouth may get dry. Chewing sugarless gum or sucking hard candy and drinking plenty of water may help. Contact your health care provider if the problem does not go away or is severe. The tablet shell for some brands of this drug does not dissolve. This is normal. The tablet shell may appear whole in the stool. This is not a cause for concern. What side effects may I notice from receiving this medicine? Side effects that you should report to your doctor or health care professional as soon as possible:  allergic reactions like skin rash, itching or hives, swelling of the face, lips, or tongue  breathing problems  confusion  seizures  signs and symptoms of low blood pressure like dizziness; feeling faint or lightheaded, falls; unusually weak or tired  trouble passing urine or change in the amount of urine Side effects that usually do not require medical attention (report to your doctor or health care professional if they continue or are bothersome):  constipation  dry mouth  nausea, vomiting  tiredness This  list may not describe all possible side effects. Call your doctor for medical advice about side effects. You may report side effects to FDA at 1-800-FDA-1088. Where should I keep my medicine? Keep out of the reach of children. This medicine can be abused. Keep your medicine in a safe place to protect it from theft. Do not share this medicine with anyone. Selling or giving away this medicine is dangerous and is against the law. Store at room temperature between 15 and 30 degrees C (59 and 86 degrees F). Protect from light. This medicine may cause harm and death if it is taken by other adults, children, or pets. Return medicine that has not been used to an official disposal site. Contact the DEA at 410-705-2533 or your city/county government to find a site. If  you cannot return the medicine, flush it down the toilet. Do not use the medicine after the expiration date. NOTE: This sheet is a summary. It may not cover all possible information. If you have questions about this medicine, talk to your doctor, pharmacist, or health care provider.  2020 [employer] Standard (2019-01-05 10:30:38) Preparing for Procedure with Sedation Instructions: . Oral Intake: Do not eat or drink anything for at least 8 hours prior to your procedure. . Transportation: Public transportation is not allowed. Bring an adult driver. The driver must be physically present in our waiting room before any procedure can be started. Marland Kitchen Physical Assistance: Bring an adult capable of physically assisting you, in the event you need help. . Blood Pressure Medicine: Take your blood pressure medicine with a sip of water the morning of the procedure. . Insulin: Take only  of your normal insulin dose. . Preventing infections: Shower with an antibacterial soap the morning of your procedure. . Build-up your immune system: Take 1000 mg of Vitamin C with every meal (3 times a day) the day prior to your procedure. . Pregnancy: If you are pregnant, call and cancel the procedure. . Sickness: If you have a cold, fever, or any active infections, call and cancel the procedure. . Arrival: You must be in the facility at least 30 minutes prior to your scheduled procedure. . Children: Do not bring children with you. . Dress appropriately: Bring dark clothing that you would not mind if they get stained. . Valuables: Do not bring any jewelry or valuables. Procedure appointments are reserved for interventional treatments only. Marland Kitchen No Prescription Refills. . No medication changes will be discussed during procedure appointments. . No disability issues will be discussed.    Morphine sustained-release tablets What is this medicine? MORPHINE (MOR feen) is a pain reliever. It is used to treat moderate to severe  pain that lasts for more than a few days. This medicine may be used for other purposes; ask your health care provider or pharmacist if you have questions. COMMON BRAND NAME(S): ARYMO ER, MORPHABOND, MS Contin, Oramorph SR What should I tell my health care provider before I take this medicine? They need to know if you have any of these conditions: brain tumor drug abuse or addiction head injury heart disease if you often drink alcohol liver disease lung or breathing disease, like asthma problems urinating seizures stomach or intestine problems taken an MAOI like Carbex, Eldepryl, Marplan, Nardil, or Parnate in the last 14 days an unusual or allergic reaction to morphine, other medications, foods, dyes, or preservatives pregnant or trying to get pregnant breast-feeding How should I use this medicine? Take this medicine by mouth with a glass of  water. Do not break, crush, or chew the medicine. Do not take a tablet that is not whole. A broken or crushed tablet can be very dangerous. You may get too much medicine. If the medicine upsets your stomach, take it with food or milk. Follow the directions on the prescription label. Take the medicine at the same time each day. Do not take more medicine than you are told to take. A special MedGuide will be given to you by the pharmacist with each prescription and refill. Be sure to read this information carefully each time. Talk to your pediatrician regarding the use of this medicine in children. Special care may be needed. Overdosage: If you think you have taken too much of this medicine contact a poison control center or emergency room at once. NOTE: This medicine is only for you. Do not share this medicine with others. What if I miss a dose? If you miss a dose, take it as soon as you can. If it is almost time for your next dose, take only that dose. Do not take double or extra doses. What may interact with this medicine? This medicine may interact  with the following medications: alcohol antihistamines for allergy, cough and cold atropine certain medicines for anxiety or sleep certain medicines for bladder problems like oxybutynin, tolterodine certain medicines for depression like amitriptyline, fluoxetine, sertraline certain medicines for Parkinson's disease like benztropine, trihexyphenidyl certain medicines for seizures like phenobarbital, primidone certain medicines for stomach problems like dicyclomine, hyoscyamine certain medicines for travel sickness like scopolamine cimetidine general anesthetics like halothane, isoflurane, methoxyflurane, propofol ipratropium local anesthetics like lidocaine, pramoxine, tetracaine MAOIs like Carbex, Eldepryl, Marplan, Nardil, and Parnate medicines that relax muscles for surgery other narcotic medicines for pain or cough phenothiazines like chlorpromazine, mesoridazine, prochlorperazine, thioridazine This list may not describe all possible interactions. Give your health care provider a list of all the medicines, herbs, non-prescription drugs, or dietary supplements you use. Also tell them if you smoke, drink alcohol, or use illegal drugs. Some items may interact with your medicine. What should I watch for while using this medicine? Tell your health care provider if your pain does not go away, if it gets worse, or if you have new or a different type of pain. You may develop tolerance to this drug. Tolerance means that you will need a higher dose of the drug for pain relief. Tolerance is normal and is expected if you take this drug for a long time. There are different types of narcotic drugs (opioids) for pain. If you take more than one type at the same time, you may have more side effects. Give your health care provider a list of all drugs you use. He or she will tell you how much drug to take. Do not take more drug than directed. Get emergency help right away if you have problems breathing. Do  not suddenly stop taking your drug because you may develop a severe reaction. Your body becomes used to the drug. This does NOT mean you are addicted. Addiction is a behavior related to getting and using a drug for a nonmedical reason. If you have pain, you have a medical reason to take pain drug. Your health care provider will tell you how much drug to take. If your health care provider wants you to stop the drug, the dose will be slowly lowered over time to avoid any side effects. Talk to your health care provider about naloxone and how to get it. Naloxone is an  emergency drug used for an opioid overdose. An overdose can happen if you take too much opioid. It can also happen if an opioid is taken with some other drugs or substances, like alcohol. Know the symptoms of an overdose, like trouble breathing, unusually tired or sleepy, or not being able to respond or wake up. Make sure to tell caregivers and close contacts where it is stored. Make sure they know how to use it. After naloxone is given, you must get emergency help right away. Naloxone is a temporary treatment. Repeat doses may be needed. You may get drowsy or dizzy. Do not drive, use machinery, or do anything that needs mental alertness until you know how this drug affects you. Do not stand up or sit up quickly, especially if you are an older patient. This reduces the risk of dizzy or fainting spells. Alcohol may interfere with the effect of this drug. Avoid alcoholic drinks. This drug will cause constipation. If you do not have a bowel movement for 3 days, call your health care provider. Your mouth may get dry. Chewing sugarless gum or sucking hard candy and drinking plenty of water may help. Contact your health care provider if the problem does not go away or is severe. The tablet shell for some brands of this drug does not dissolve. This is normal. The tablet shell may appear whole in the stool. This is not a cause for concern. What side effects  may I notice from receiving this medicine? Side effects that you should report to your doctor or health care professional as soon as possible: allergic reactions like skin rash, itching or hives, swelling of the face, lips, or tongue breathing problems confusion seizures signs and symptoms of low blood pressure like dizziness; feeling faint or lightheaded, falls; unusually weak or tired trouble passing urine or change in the amount of urine Side effects that usually do not require medical attention (report to your doctor or health care professional if they continue or are bothersome): constipation dry mouth nausea, vomiting tiredness This list may not describe all possible side effects. Call your doctor for medical advice about side effects. You may report side effects to FDA at 1-800-FDA-1088. Where should I keep my medicine? Keep out of the reach of children. This medicine can be abused. Keep your medicine in a safe place to protect it from theft. Do not share this medicine with anyone. Selling or giving away this medicine is dangerous and is against the law. Store at room temperature between 15 and 30 degrees C (59 and 86 degrees F). Protect from light. This medicine may cause harm and death if it is taken by other adults, children, or pets. Return medicine that has not been used to an official disposal site. Contact the DEA at [phone] or your city/county government to find a site. If you cannot return the medicine, flush it down the toilet. Do not use the medicine after the expiration date. NOTE: This sheet is a summary. It may not cover all possible information. If you have questions about this medicine, talk to your doctor, pharmacist, or health care provider.  2020 [employer] Standard (2019-01-05 10:30:38) .

## 2020-02-08 ENCOUNTER — Ambulatory Visit
Admission: RE | Admit: 2020-02-08 | Discharge: 2020-02-08 | Disposition: A | Discharge status: Home / Self Care | Payer: Medicare Other | Source: Ambulatory Visit | Attending: Student in an Organized Health Care Education/Training Program | Admitting: Student in an Organized Health Care Education/Training Program

## 2020-02-08 ENCOUNTER — Telehealth: Payer: Self-pay | Admitting: *Deleted

## 2020-02-08 ENCOUNTER — Encounter: Payer: Self-pay | Admitting: Student in an Organized Health Care Education/Training Program

## 2020-02-08 ENCOUNTER — Other Ambulatory Visit: Payer: Self-pay

## 2020-02-08 ENCOUNTER — Ambulatory Visit (HOSPITAL_BASED_OUTPATIENT_CLINIC_OR_DEPARTMENT_OTHER): Payer: Medicare Other | Admitting: Student in an Organized Health Care Education/Training Program

## 2020-02-08 VITALS — BP 110/72 | HR 92 | Temp 97.5°F | Resp 18 | Ht 66.0 in | Wt 185.0 lb

## 2020-02-08 DIAGNOSIS — G894 Chronic pain syndrome: Secondary | ICD-10-CM

## 2020-02-08 DIAGNOSIS — M5126 Other intervertebral disc displacement, lumbar region: Secondary | ICD-10-CM | POA: Insufficient documentation

## 2020-02-08 DIAGNOSIS — M9973 Connective tissue and disc stenosis of intervertebral foramina of lumbar region: Secondary | ICD-10-CM | POA: Insufficient documentation

## 2020-02-08 DIAGNOSIS — M5417 Radiculopathy, lumbosacral region: Secondary | ICD-10-CM

## 2020-02-08 DIAGNOSIS — M5136 Other intervertebral disc degeneration, lumbar region: Secondary | ICD-10-CM

## 2020-02-08 DIAGNOSIS — M4310 Spondylolisthesis, site unspecified: Secondary | ICD-10-CM | POA: Insufficient documentation

## 2020-02-08 DIAGNOSIS — M48061 Spinal stenosis, lumbar region without neurogenic claudication: Secondary | ICD-10-CM

## 2020-02-08 MED ORDER — FENTANYL CITRATE (PF) 100 MCG/2ML IJ SOLN
25.0000 ug | INTRAMUSCULAR | Status: AC | PRN
  Administered 2020-02-08: 50 ug via INTRAVENOUS
  Administered 2020-02-08: 25 ug via INTRAVENOUS
  Administered 2020-02-08: 50 ug via INTRAVENOUS

## 2020-02-08 MED ORDER — SODIUM CHLORIDE 0.9% FLUSH
2.0000 mL | Freq: Once | INTRAVENOUS | Status: AC
  Administered 2020-02-08: 2 mL

## 2020-02-08 MED ORDER — DEXAMETHASONE SODIUM PHOSPHATE 10 MG/ML IJ SOLN
10.0000 mg | Freq: Once | INTRAMUSCULAR | Status: AC
  Administered 2020-02-08: 10 mg

## 2020-02-08 MED ORDER — LIDOCAINE HCL 2 % IJ SOLN
20.0000 mL | Freq: Once | INTRAMUSCULAR | Status: AC
  Administered 2020-02-08: 400 mg

## 2020-02-08 MED ORDER — LIDOCAINE HCL 2 % IJ SOLN
INTRAMUSCULAR | Status: AC
  Filled 2020-02-08: qty 20

## 2020-02-08 MED ORDER — IOHEXOL 180 MG/ML  SOLN
10.0000 mL | Freq: Once | INTRAMUSCULAR | Status: AC
  Administered 2020-02-08: 10 mL via EPIDURAL

## 2020-02-08 MED ORDER — ROPIVACAINE HCL 2 MG/ML IJ SOLN
INTRAMUSCULAR | Status: AC
  Filled 2020-02-08: qty 10

## 2020-02-08 MED ORDER — ROPIVACAINE HCL 2 MG/ML IJ SOLN
2.0000 mL | Freq: Once | INTRAMUSCULAR | Status: AC
  Administered 2020-02-08: 2 mL via EPIDURAL

## 2020-02-08 MED ORDER — DEXAMETHASONE SODIUM PHOSPHATE 10 MG/ML IJ SOLN
INTRAMUSCULAR | Status: AC
  Filled 2020-02-08: qty 2

## 2020-02-08 MED ORDER — SODIUM CHLORIDE (PF) 0.9 % IJ SOLN
INTRAMUSCULAR | Status: AC
  Filled 2020-02-08: qty 10

## 2020-02-08 MED ORDER — FENTANYL CITRATE (PF) 100 MCG/2ML IJ SOLN
INTRAMUSCULAR | Status: AC
  Filled 2020-02-08: qty 2

## 2020-02-08 MED ORDER — ROPIVACAINE HCL 2 MG/ML IJ SOLN
1.0000 mL | Freq: Once | INTRAMUSCULAR | Status: AC
  Administered 2020-02-08: 1 mL via EPIDURAL

## 2020-02-08 NOTE — Progress Notes (Signed)
Safety precautions to be maintained throughout the outpatient stay will include: orient to surroundings, keep bed in low position, maintain call bell within reach at all times, provide assistance with transfer out of bed and ambulation.  

## 2020-02-08 NOTE — Patient Instructions (Signed)
Pain Management Discharge Instructions  General Discharge Instructions :  If you need to reach your doctor call: Monday-Friday 8:00 am - 4:00 pm at [phone] or toll free [phone].  After clinic hours [phone] to have operator reach doctor.  Bring all of your medication bottles to all your appointments in the pain clinic.  To cancel or reschedule your appointment with Pain Management please remember to call 24 hours in advance to avoid a fee.  Refer to the educational materials which you have been given on: General Risks, I had my Procedure. Discharge Instructions, Post Sedation.  Post Procedure Instructions:  The drugs you were given will stay in your system until tomorrow, so for the next 24 hours you should not drive, make any legal decisions or drink any alcoholic beverages.  You may eat anything you prefer, but it is better to start with liquids then soups and crackers, and gradually work up to solid foods.  Please notify your doctor immediately if you have any unusual bleeding, trouble breathing or pain that is not related to your normal pain.  Depending on the type of procedure that was done, some parts of your body may feel week and/or numb.  This usually clears up by tonight or the next day.  Walk with the use of an assistive device or accompanied by an adult for the 24 hours.  You may use ice on the affected area for the first 24 hours.  Put ice in a Ziploc bag and cover with a towel and place against area 15 minutes on 15 minutes off.  You may switch to heat after 24 hours.Epidural Steroid Injection Patient Information  Description: The epidural space surrounds the nerves as they exit the spinal cord.  In some patients, the nerves can be compressed and inflamed by a bulging disc or a tight spinal canal (spinal stenosis).  By injecting steroids into the epidural space, we can bring irritated nerves into direct contact with a potentially helpful medication.  These  steroids act directly on the irritated nerves and can reduce swelling and inflammation which often leads to decreased pain.  Epidural steroids may be injected anywhere along the spine and from the neck to the low back depending upon the location of your pain.   After numbing the skin with local anesthetic (like Novocaine), a small needle is passed into the epidural space slowly.  You may experience a sensation of pressure while this is being done.  The entire block usually last less than 10 minutes.  Conditions which may be treated by epidural steroids:   Low back and leg pain  Neck and arm pain  Spinal stenosis  Post-laminectomy syndrome  Herpes zoster (shingles) pain  Pain from compression fractures  Preparation for the injection:  1. Do not eat any solid food or dairy products within 8 hours of your appointment.  2. You may drink clear liquids up to 3 hours before appointment.  Clear liquids include water, black coffee, juice or soda.  No milk or cream please. 3. You may take your regular medication, including pain medications, with a sip of water before your appointment  Diabetics should hold regular insulin (if taken separately) and take 1/2 normal NPH dos the morning of the procedure.  Carry some sugar containing items with you to your appointment. 4. A driver must accompany you and be prepared to drive you home after your procedure.  5. Bring all your current medications with your. 6. An IV may be inserted and   sedation may be given at the discretion of the physician.   7. A blood pressure cuff, EKG and other monitors will often be applied during the procedure.  Some patients may need to have extra oxygen administered for a short period. 8. You will be asked to provide medical information, including your allergies, prior to the procedure.  We must know immediately if you are taking blood thinners (like Coumadin/Warfarin)  Or if you are allergic to IV iodine contrast (dye). We must  know if you could possible be pregnant.  Possible side-effects:  Bleeding from needle site  Infection (rare, may require surgery)  Nerve injury (rare)  Numbness & tingling (temporary)  Difficulty urinating (rare, temporary)  Spinal headache ( a headache worse with upright posture)  Light -headedness (temporary)  Pain at injection site (several days)  Decreased blood pressure (temporary)  Weakness in arm/leg (temporary)  Pressure sensation in back/neck (temporary)  Call if you experience:  Fever/chills associated with headache or increased back/neck pain.  Headache worsened by an upright position.  New onset weakness or numbness of an extremity below the injection site  Hives or difficulty breathing (go to the emergency room)  Inflammation or drainage at the infection site  Severe back/neck pain  Any new symptoms which are concerning to you  Please note:  Although the local anesthetic injected can often make your back or neck feel good for several hours after the injection, the pain will likely return.  It takes 3-7 days for steroids to work in the epidural space.  You may not notice any pain relief for at least that one week.  If effective, we will often do a series of three injections spaced 3-6 weeks apart to maximally decrease your pain.  After the initial series, we generally will wait several months before considering a repeat injection of the same type.  If you have any questions, please call [phone] [facility] 

## 2020-02-08 NOTE — Progress Notes (Signed)
PROVIDER NOTE: Information contained herein reflects review and annotations entered in association with encounter. Interpretation of such information and data should be left to medically-trained personnel. Information provided to patient can be located elsewhere in the medical record under "Patient Instructions". Document created using STT-dictation technology, any transcriptional errors that may result from process are unintentional.    Patient: Howard Murphy  Service Category: Procedure  Provider: Gillis Santa, MD  DOB: 08-06-1955  DOS: 02/08/2020  Location: [facility]  MRN: [id number]  Setting: Ambulatory - outpatient  Referring Provider: [facility], [facility]*  Type: Established Patient  Specialty: Interventional Pain Management  PCP: [facility], [facility]   Primary Reason for Visit: Interventional Pain Management Treatment. CC: Back Pain (lumbar right )  Procedure #1:  Anesthesia, Analgesia, Anxiolysis:  Type: Therapeutic Trans-Foraminal Epidural Steroid Injection   #2 (#1 done 01/05/2020) Region: Lumbar Level: L1 Paravertebral Laterality: Right Paravertebral   Type: Moderate (Conscious) Sedation combined with Local Anesthesia Indication(s): Analgesia and Anxiety Route: Intravenous (IV) IV Access: Secured Sedation: Meaningful verbal contact was maintained at all times during the procedure  Local Anesthetic: Lidocaine 1-2%  Position: Prone  Procedure #2:    Type: Therapeutic Inter-Laminar Epidural Steroid Injection   #2 (#1 done 01/05/2020) Region: Lumbar Level: L1-2 Level. Laterality: Midline             Indications: 1. Lumbosacral radiculopathy at L1 (Right)   2. DDD (degenerative disc disease), lumbosacral   3. Grade 1 Retrolisthesis of L1/L2   4. Lumbar disc extrusion with 7 mm caudal migration (L1-2)   5. Lumbar foraminal stenosis  (Left: L4-5) (Right: L1-2)   6. 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 Groin pain (Right)    8. Acute exacerbation of chronic low back pain   9. Failed back surgical syndrome 4   10. Abnormal MRI, lumbar spine (12/23/2019)    Pain Score: Pre-procedure: 6 /10 Post-procedure: 5 /10   Pre-op Assessment:  Howard Murphy is a 64 y.o. (year old), male patient, seen today for interventional treatment. He  has a past surgical history that includes Cholecystectomy (2000); Shoulder surgery (Right, 2016); Colonoscopy with propofol (N/A, 04/11/2015); Esophagogastroduodenoscopy (egd) with propofol (N/A, 04/11/2015); Shoulder surgery (Left, 06/2015); Back surgery (2015); Back surgery (08/09/2016); Cystoscopy with ureteroscopy and stent placement (Right, 08/27/2017); laparoscopic appendectomy (N/A, 12/18/2017); Appendectomy; and Back surgery (Bilateral, 2019). Howard Murphy has a current medication list which includes the following prescription(s): albuterol, atorvastatin, canagliflozin, chlorthalidone, cholecalciferol, cyclobenzaprine, jardiance, lisinopril, meclizine, [START ON 02/25/2020] melatonin, [START ON 03/02/2020] morphine, [START ON 04/01/2020] morphine, [START ON 05/01/2020] morphine, ondansetron, oxycodone hcl, potassium chloride, tamsulosin, and testosterone cypionate. His primarily concern today is the Back Pain (lumbar right )  Initial Vital Signs:  Pulse/HCG Rate: 81ECG Heart Rate: 76 Temp: (!) 97.1 F (36.2 C) Resp: 16 BP: 118/76 SpO2: 98 %  BMI: Estimated body mass index is 29.86 kg/m as calculated from the following:   Height as of this encounter: 5\' 6"  (1.676 m).   Weight as of this encounter: 185 lb (83.9 kg).  Risk Assessment: Allergies: Reviewed. He is allergic to adhesive [tape].  Allergy Precautions: None required Coagulopathies: Reviewed. None identified.  Blood-thinner therapy: None at this time Active Infection(s): Reviewed. None identified. Howard Murphy is afebrile  Site Confirmation: Howard Murphy was asked to confirm the procedure and laterality before marking the  site Procedure checklist: Completed Consent: Before the procedure and under the influence of no sedative(s), amnesic(s), or anxiolytics, the patient was  informed of the treatment options, risks and possible complications. To fulfill our ethical and legal obligations, as recommended by the American Medical Association's Code of Ethics, I have informed the patient of my clinical impression; the nature and purpose of the treatment or procedure; the risks, benefits, and possible complications of the intervention; the alternatives, including doing nothing; the risk(s) and benefit(s) of the alternative treatment(s) or procedure(s); and the risk(s) and benefit(s) of doing nothing. The patient was provided information about the general risks and possible complications associated with the procedure. These may include, but are not limited to: failure to achieve desired goals, infection, bleeding, organ or nerve damage, allergic reactions, paralysis, and death. In addition, the patient was informed of those risks and complications associated to Spine-related procedures, such as failure to decrease pain; infection (i.e.: Meningitis, epidural or intraspinal abscess); bleeding (i.e.: epidural hematoma, subarachnoid hemorrhage, or any other type of intraspinal or peri-dural bleeding); organ or nerve damage (i.e.: Any type of peripheral nerve, nerve root, or spinal cord injury) with subsequent damage to sensory, motor, and/or autonomic systems, resulting in permanent pain, numbness, and/or weakness of one or several areas of the body; allergic reactions; (i.e.: anaphylactic reaction); and/or death. Furthermore, the patient was informed of those risks and complications associated with the medications. These include, but are not limited to: allergic reactions (i.e.: anaphylactic or anaphylactoid reaction(s)); adrenal axis suppression; blood sugar elevation that in diabetics may result in ketoacidosis or comma; water retention  that in patients with history of congestive heart failure may result in shortness of breath, pulmonary edema, and decompensation with resultant heart failure; weight gain; swelling or edema; medication-induced neural toxicity; particulate matter embolism and blood vessel occlusion with resultant organ, and/or nervous system infarction; and/or aseptic necrosis of one or more joints. Finally, the patient was informed that Medicine is not an exact science; therefore, there is also the possibility of unforeseen or unpredictable risks and/or possible complications that may result in a catastrophic outcome. The patient indicated having understood very clearly. We have given the patient no guarantees and we have made no promises. Enough time was given to the patient to ask questions, all of which were answered to the patient's satisfaction. Mr. Sparks has indicated that he wanted to continue with the procedure. Attestation: I, the ordering provider, attest that I have discussed with the patient the benefits, risks, side-effects, alternatives, likelihood of achieving goals, and potential problems during recovery for the procedure that I have provided informed consent. Date  Time: 02/08/2020  9:47 AM  Pre-Procedure Preparation:  Monitoring: As per clinic protocol. Respiration, ETCO2, SpO2, BP, heart rate and rhythm monitor placed and checked for adequate function Safety Precautions: Patient was assessed for positional comfort and pressure points before starting the procedure. Time-out: I initiated and conducted the "Time-out" before starting the procedure, as per protocol. The patient was asked to participate by confirming the accuracy of the "Time Out" information. Verification of the correct person, site, and procedure were performed and confirmed by me, the nursing staff, and the patient. "Time-out" conducted as per Joint Commission's Universal Protocol (UP.01.01.01). Time: 1012  Description of Procedure #1:   Target Area: The inferior and lateral portion of the pedicle, just lateral to a line created by the 6:00 position of the pedicle and the superior articular process of the vertebral body below. On the lateral view, this target lies just posterior to the anterior aspect of the lamina and posterior to the midpoint created between the anterior and the posterior aspect  of the neural foramina. Approach: Posterior paravertebral approach. Area Prepped: Entire Posterior Lumbosacral Region DuraPrep (Iodine Povacrylex [0.7% available iodine] and Isopropyl Alcohol, 74% w/w) Safety Precautions: Aspiration looking for blood return was conducted prior to all injections. At no point did we inject any substances, as a needle was being advanced. No attempts were made at seeking any paresthesias. Safe injection practices and needle disposal techniques used. Medications properly checked for expiration dates. SDV (single dose vial) medications used.  Description of the Procedure: Protocol guidelines were followed. The patient was placed in position over the fluoroscopy table. The target area was identified and the area prepped in the usual manner. Skin & deeper tissues infiltrated with local anesthetic. Appropriate amount of time allowed to pass for local anesthetics to take effect. The procedure needles were then advanced to the target area. Proper needle placement secured. Negative aspiration confirmed. Solution injected in intermittent fashion, asking for systemic symptoms every 0.2cc of injectate. The needles were then removed and the area cleansed, making sure to leave some of the prepping solution back to take advantage of its long term bactericidal properties.  Start Time: 1012 hrs.  Materials:  Needle(s) Type: Spinal Needle Gauge: 22G Length: 5-in Medication(s): Please see orders for medications and dosing details. 3 cc solution made of 1 cc of 0.2% ropivacaine, 1 cc of normal saline, 1 cc of Decadron 10  mg/cc. Description of Procedure #2:  Target Area: The  interlaminar space, initially targeting the lower border of the superior vertebral body lamina. Approach: Posterior paramedial approach. Area Prepped: Same as above Prepping solution: Same as above Safety Precautions: Same as above  Description of the Procedure: Protocol guidelines were followed. The patient was placed in position over the fluoroscopy table. The target area was identified and the area prepped in the usual manner. Skin & deeper tissues infiltrated with local anesthetic. Appropriate amount of time allowed to pass for local anesthetics to take effect. The procedure needle was introduced through the skin, ipsilateral to the reported pain, and advanced to the target area. Bone was contacted and the needle walked caudad, until the lamina was cleared. The ligamentum flavum was engaged and loss-of-resistance technique used as the epidural needle was advanced. The epidural space was identified using "loss-of-resistance technique" with 2-3 ml of PF-NaCl (0.9% NSS), in a 5cc LOR glass syringe. Proper needle placement secured. Negative aspiration confirmed. Solution injected in intermittent fashion, asking for systemic symptoms every 0.5cc of injectate. The needles were then removed and the area cleansed, making sure to leave some of the prepping solution back to take advantage of its long term bactericidal properties.  Vitals:   02/08/20 1028 02/08/20 1038 02/08/20 1048 02/08/20 1056  BP: (!) 133/94 121/77 112/72 110/72  Pulse: 92     Resp: 16 18 16 18   Temp:    (!) 97.5 F (36.4 C)  TempSrc:    Temporal  SpO2: 97% 94% 95% 95%  Weight:      Height:        End Time: 1028 hrs.  Materials:  Needle(s) Type: Epidural needle Gauge: 17G Length: 3.5-in Medication(s): Please see orders for medications and dosing details. 6 cc solution made of 3 cc of normal saline, 2 cc of 0.2% ropivacaine, 1 cc of Decadron 10 mg/cc. Imaging Guidance  (Spinal):          Type of Imaging Technique: Fluoroscopy Guidance (Spinal) Indication(s): Assistance in needle guidance and placement for procedures requiring needle placement in or near specific anatomical locations not  easily accessible without such assistance. Exposure Time: Please see nurses notes. Contrast: Before injecting any contrast, we confirmed that the patient did not have an allergy to iodine, shellfish, or radiological contrast. Once satisfactory needle placement was completed at the desired level, radiological contrast was injected. Contrast injected under live fluoroscopy. No contrast complications. See chart for type and volume of contrast used. Fluoroscopic Guidance: I was personally present during the use of fluoroscopy. "Tunnel Vision Technique" used to obtain the best possible view of the target area. Parallax error corrected before commencing the procedure. "Direction-depth-direction" technique used to introduce the needle under continuous pulsed fluoroscopy. Once target was reached, antero-posterior, oblique, and lateral fluoroscopic projection used confirm needle placement in all planes. Images permanently stored in EMR. Interpretation: I personally interpreted the imaging intraoperatively. Adequate needle placement confirmed in multiple planes. Appropriate spread of contrast into desired area was observed. No evidence of afferent or efferent intravascular uptake. No intrathecal or subarachnoid spread observed. Permanent images saved into the patient's record.  Antibiotic Prophylaxis:   Anti-infectives (From admission, onward)   None     Indication(s): None identified  Post-operative Assessment:  Post-procedure Vital Signs:  Pulse/HCG Rate: 9276 Temp: (!) 97.5 F (36.4 C) Resp: 18 BP: 110/72 SpO2: 95 %  EBL: None  Complications: No immediate post-treatment complications observed by team, or reported by patient.  Note: The patient tolerated the entire procedure  well. A repeat set of vitals were taken after the procedure and the patient was kept under observation following institutional policy, for this type of procedure. Post-procedural neurological assessment was performed, showing return to baseline, prior to discharge. The patient was provided with post-procedure discharge instructions, including a section on how to identify potential problems. Should any problems arise concerning this procedure, the patient was given instructions to immediately contact us, at any time, without hesitation. In any case, we plan to contact the patient by telephone for a follow-up status report regarding this interventional procedure.  Comments:  No additional relevant information.  The patient was reminded to keep an eye on his blood sugar as the steroids will bring it up for at least 2 weeks.  He understood and accepted and he indicated that he would be checking it every day.   5 out of 5 strength bilateral lower extremity: Plantar flexion, dorsiflexion, knee flexion, knee extension.   Plan of Care  Orders:  Orders Placed This Encounter  Procedures  . [facility] C-ARM 1-60 MIN NO REPORT    Intraoperative interpretation by procedural physician at [place].    Standing Status:   Standing    Number of Occurrences:   1    Order Specific Question:   Reason for exam:    Answer:   Assistance in needle guidance and placement for procedures requiring needle placement in or near specific anatomical locations not easily accessible without such assistance.   Chronic Opioid Analgesic:  Oxycodone IR 10 mg 1 tab PO q 8 hrs (30 mg/day of oxycodone) MME/day:45mg /day.   Medications ordered for procedure: Meds ordered this encounter  Medications  . iohexol (OMNIPAQUE) 180 MG/ML injection 10 mL    Must be Myelogram-compatible. If not available, you may substitute with a water-soluble, non-ionic, hypoallergenic, myelogram-compatible radiological contrast medium.   Marland Kitchen lidocaine (XYLOCAINE) 2 % (with pres) injection 400 mg  . fentaNYL (SUBLIMAZE) injection 25-50 mcg    Make sure Narcan is available in the pyxis when using this medication. In the event of respiratory depression (RR< 8/min): Titrate  NARCAN (naloxone) in increments of 0.1 to 0.2 mg IV at 2-3 minute intervals, until desired degree of reversal.  . ropivacaine (PF) 2 mg/mL (0.2%) (NAROPIN) injection 2 mL  . sodium chloride flush (NS) 0.9 % injection 2 mL  . dexamethasone (DECADRON) injection 10 mg  . ropivacaine (PF) 2 mg/mL (0.2%) (NAROPIN) injection 1 mL  . dexamethasone (DECADRON) injection 10 mg   Medications administered: We administered iohexol, lidocaine, fentaNYL, ropivacaine (PF) 2 mg/mL (0.2%), sodium chloride flush, dexamethasone, ropivacaine (PF) 2 mg/mL (0.2%), and dexamethasone.  See the medical record for exact dosing, route, and time of administration.  Follow-up plan:   Return in about 4 weeks (around 03/07/2020) for Post Procedure Evaluation, virtual.    Recent Visits Date Type Provider Dept  02/03/20 Office Visit Milinda Pointer, MD [facility]  01/05/20 Procedure visit Milinda Pointer, MD [facility]  12/30/19 Office Visit Milinda Pointer, MD [facility]  12/03/19 Office Visit Milinda Pointer, MD [facility]  Showing recent visits within past 90 days and meeting all other requirements Today's Visits Date Type Provider Dept  02/08/20 Procedure visit Gillis Santa, MD [facility]  Showing today's visits and meeting all other requirements Future Appointments Date Type Provider Dept  03/07/20 Appointment Gillis Santa, MD [facility]  03/16/20 Appointment Milinda Pointer, MD [facility]  Showing future appointments within next 90 days and meeting all other requirements  Disposition: Discharge home  Discharge (Date  Time): 02/08/2020; 1100 hrs.   Primary Care Physician: [facility], [place] Location: [facility] Note by: Gillis Santa, MD Date: 02/08/2020; Time: 12:55 PM  Disclaimer:  Medicine is not an exact science. The only guarantee in medicine is that nothing is guaranteed. It is important to note that the decision to proceed with this intervention was based on the information collected from the patient. The Data and conclusions were drawn from the patient's questionnaire, the interview, and the physical examination. Because the information was provided in large part by the patient, it cannot be guaranteed that it has not been purposely or unconsciously manipulated. Every effort has been made to obtain as much relevant data as possible for this evaluation. It is important to note that the conclusions that lead to this procedure are derived in large part from the available data. Always take into account that the treatment will also be dependent on availability of resources and existing treatment guidelines, considered by other Pain Management Practitioners as being common knowledge and practice, at the time of the intervention. For Medico-Legal purposes, it is also important to point out that variation in procedural techniques and pharmacological choices are the acceptable norm. The indications, contraindications, technique, and results of the above procedure should only be interpreted and judged by a Board-Certified Interventional Pain Specialist with extensive familiarity and expertise in the same exact procedure and technique.

## 2020-02-09 ENCOUNTER — Telehealth: Payer: Self-pay

## 2020-02-09 NOTE — Telephone Encounter (Signed)
Post procedure phone call.  Patient states he is hurting a little .  Instructed to place heat today and to call us for any further questions or concerns,.

## 2020-02-20 DIAGNOSIS — F1120 Opioid dependence, uncomplicated: Secondary | ICD-10-CM | POA: Insufficient documentation

## 2020-02-22 ENCOUNTER — Ambulatory Visit: Payer: Medicare Other | Admitting: Pain Medicine

## 2020-02-24 ENCOUNTER — Ambulatory Visit: Payer: Medicare Other | Admitting: Pain Medicine

## 2020-03-07 ENCOUNTER — Telehealth: Payer: Medicare Other | Admitting: Student in an Organized Health Care Education/Training Program

## 2020-03-10 DIAGNOSIS — I10 Essential (primary) hypertension: Secondary | ICD-10-CM | POA: Diagnosis not present

## 2020-03-10 DIAGNOSIS — F411 Generalized anxiety disorder: Secondary | ICD-10-CM | POA: Diagnosis not present

## 2020-03-15 NOTE — Progress Notes (Signed)
PROVIDER NOTE: Information contained herein reflects review and annotations entered in association with encounter. Interpretation of such information and data should be left to medically-trained personnel. Information provided to patient can be located elsewhere in the medical record under "Patient Instructions". Document created using STT-dictation technology, any transcriptional errors that may result from process are unintentional.    Patient: Howard Murphy  Service Category: E/M  Provider: Gaspar Cola, MD  DOB: [birth year]  DOS: 03/16/2020  Specialty: Interventional Pain Management  MRN: [id number]  Setting: Ambulatory outpatient  PCP: [facility] [place]  Type: Established Patient    Referring Provider: [facility]  Location: Office  Delivery: Face-to-face     HPI  Mr. WOODLEY PETZOLD, a 64 y.o. year old male, is here today because of his Chronic pain syndrome [G89.4]. Mr. Minus primary complain today is Back Pain Last encounter: My last encounter with him was on 02/03/2020. Pertinent problems: Mr. Eastridge has History of lumbar facet Synovial cyst, surgically removed.; Chronic abdominal pain (RUQ); Type 2 diabetes mellitus with peripheral neuropathy ([place]); Spondylosis of lumbar spine; Lumbar facet joint syndrome ([place]); Chronic hip pain (Left); Diabetic peripheral neuropathy ([place]); Chronic lumbar radicular pain (S1 Dermatome) (Bilateral) (L>R); Complete tear of the shoulder rotator cuff (Left); Chronic shoulder pain (2ry area of Pain) (Bilateral) (L>R); Chronic foot pain (3ry area of Pain) (Bilateral) (L>R); Chronic hand pain (4th area of Pain) (Bilateral) (R>L); Lumbar facet hypertrophy; Occipital pain (Right); Failed back surgical syndrome 4; Chronic low back pain (1ry area of Pain) (Bilateral) (L>R) w/o sciatica; Radicular pain of shoulder (Right); Carpal tunnel syndrome (Bilateral); Chronic cervical radicular pain (Right); Abnormal MRI, lumbar spine (12/23/2019);  Chronic sacroiliac joint pain (Bilateral) (L>R); Neurogenic pain; Cervical spondylosis; Cervical facet arthropathy; Chronic neck pain (Bilateral) (R>L); Cervical facet syndrome (Bilateral) (R>L); Chronic upper extremity pain (Bilateral) (R>L); Cervical foraminal stenosis;  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, C5-6); Arthropathy of shoulder (Right); 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); Lumbar foraminal stenosis  (Left: L4-5) (Right: L1-2); Grade 1 Anterolisthesis (2 mm) of L4 over L5.; Lumbar facet (L4-5) synovial cyst (6 mm) (Left); Chronic pain syndrome; Spondylolisthesis at L3-L4 level; Complete tear of left rotator cuff; Bertolotti's syndrome (L5-S1) (Bilateral); 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); Other specified dorsopathies, sacral and sacrococcygeal region; Spondylosis without myelopathy or radiculopathy, lumbosacral region; Chronic bilateral low back pain with bilateral sciatica; Neurogenic claudication ([place]); Osteoarthritis involving multiple joints; Chronic musculoskeletal pain; Acute exacerbation of chronic low back pain; Grade 1 Retrolisthesis of L1/L2; Lumbar disc extrusion with 7 mm caudal migration (L1-2); Lumbosacral radiculopathy at L1 (Right); Groin pain (Right); 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 Pain Assessment: Severity of Chronic pain is reported as a 7 /10. Location: Back  /Pain radiaties down right leg down to his foot. Onset: More than a month ago. Quality: Aching, Burning, Sharp. Timing: Constant. Modifying factor(s): nothing. Vitals:  height is '5\' 7"'  (1.702 m) and weight is 185 lb (83.9 kg). His temperature is 98.2 F (36.8 C). His blood pressure is 143/92 (abnormal) and his pulse is 105 (abnormal). His oxygen saturation is 96%.   Reason for encounter: post-procedure assessment.  According to the patient he obtained  absolutely no relief whatsoever from the procedure.  In view of the failure of any type of interventional therapies in improving this pain, we will be referring the patient to a neurosurgeon for surgical evaluation and possible removal  of the L1-2 intervertebral disc extrusion.  He reports that he has been seen Dr. Cari Caraway at the [facility] neurosurgery department.  He wants to continue with him.  Because the patient attained 0% relief of the pain when given the IV sedation consisting of fentanyl and Versed, this means that the effectiveness of the opioids and benzodiazepines in controlling this pain appears to be zero.  Nonopioid transferred: Melatonin 10 mg caps, 2 caps p.o. at bedtime (60/month) (08/23/2020) RTCB: 05/31/2020  Post-Procedure Evaluation  Procedure (02/08/2020) by Dr. Holley Raring: Therapeutic/diagnostic right L1 transforaminal ESI #2 + midline L1 to LESI #2 under fluoroscopic guidance and IV sedation Pre-procedure pain level: 6/10 Post-procedure: 5/10 (< 50% relief)  Sedation: Sedation provided.  Effectiveness during initial hour after procedure(Ultra-Short Term Relief): 0 %.  Local anesthetic used: Long-acting (4-6 hours) Effectiveness: Defined as any analgesic benefit obtained secondary to the administration of local anesthetics. This carries significant diagnostic value as to the etiological location, or anatomical origin, of the pain. Duration of benefit is expected to coincide with the duration of the local anesthetic used.  Effectiveness during initial 4-6 hours after procedure(Short-Term Relief): 0 %.  Long-term benefit: Defined as any relief past the pharmacologic duration of the local anesthetics.  Effectiveness past the initial 6 hours after procedure(Long-Term Relief): 0 %.  Current benefits: Defined as benefit that persist at this time.   Analgesia:  No benefit Function: No benefit ROM: No benefit  Pharmacotherapy Assessment   Analgesic: Oxycodone IR 10 mg 1  tab PO q 8 hrs (30 mg/day of oxycodone) MME/day:46m/day.   Monitoring: [place] PMP: PDMP reviewed during this encounter.       Pharmacotherapy: No side-effects or adverse reactions reported. Compliance: No problems identified. Effectiveness: Clinically acceptable.  BChauncey Fischer RN  03/16/2020  8:15 AM  Sign when Signing Visit Safety precautions to be maintained throughout the outpatient stay will include: orient to surroundings, keep bed in low position, maintain call bell within reach at all times, provide assistance with transfer out of bed and ambulation.     UDS:  Summary  Date Value Ref Range Status  12/02/2019 Note  Final    Comment:    ==================================================================== [employer] (MW) ==================================================================== Test                             Result       Flag       Units  Drug Present and Declared for Prescription Verification   Oxycodone                      6169         EXPECTED   ng/mg creat   Oxymorphone                    2086         EXPECTED   ng/mg creat   Noroxycodone                   4079         EXPECTED   ng/mg creat   Noroxymorphone                 645          EXPECTED   ng/mg creat    Sources of oxycodone are scheduled prescription medications.    Oxymorphone, noroxycodone, and noroxymorphone are expected    metabolites of  oxycodone. Oxymorphone is also available as a    scheduled prescription medication.  ==================================================================== Test                      Result    Flag   Units      Ref Range   Creatinine              141              mg/dL      >=20 ==================================================================== Declared Medications:  The flagging and interpretation on this report are based on the  following declared medications.  Unexpected results may arise from  inaccuracies in the declared medications.   **Note:  The testing scope of this panel includes these medications:   Oxycodone   **Note: The testing scope of this panel does not include the  following reported medications:   Albuterol (Ventolin HFA)  Atorvastatin (Lipitor)  Canagliflozin (Invokana)  Chlorthalidone (Hygroton)  Lisinopril (Zestril)  Meclizine (Antivert)  Melatonin  Ondansetron (Zofran)  Potassium (Klor-Con)  Tamsulosin (Flomax)  Testosterone  Vitamin D ==================================================================== For clinical consultation, please call [phone]. ====================================================================      ROS  Constitutional: Denies any fever or chills Gastrointestinal: No reported hemesis, hematochezia, vomiting, or acute GI distress Musculoskeletal: Denies any acute onset joint swelling, redness, loss of ROM, or weakness Neurological: No reported episodes of acute onset apraxia, aphasia, dysarthria, agnosia, amnesia, paralysis, loss of coordination, or loss of consciousness  Medication Review  Melatonin, albuterol, atorvastatin, canagliflozin, chlorthalidone, cholecalciferol, cyclobenzaprine, empagliflozin, lisinopril, meclizine, morphine, ondansetron, potassium chloride, tamsulosin, and testosterone cypionate  History Review  Allergy: Mr. Delfin is allergic to adhesive [tape]. Drug: Mr. Gangemi  reports no history of drug use. Alcohol:  reports no history of alcohol use. Tobacco:  reports that he has never smoked. He quit smokeless tobacco use about 7 years ago.  His smokeless tobacco use included chew. Social: Mr. Richert  reports that he has never smoked. He quit smokeless tobacco use about 7 years ago.  His smokeless tobacco use included chew. He reports that he does not drink alcohol and does not use drugs. Medical:  has a past medical history of Arthritis, Back ache (05/05/2013), Diabetes mellitus without complication ([place]), GERD (gastroesophageal reflux disease),  Headache, History of kidney stones, Hyperlipemia, Hypertension, Kidney stones, Kidney stones, Nephrolithiasis, and Shortness of breath dyspnea. Surgical: Mr. Hoak  has a past surgical history that includes Cholecystectomy (2000); Shoulder surgery (Right, 2016); Colonoscopy with propofol (N/A, 04/11/2015); Esophagogastroduodenoscopy (egd) with propofol (N/A, 04/11/2015); Shoulder surgery (Left, 06/2015); Back surgery (2015); Back surgery (08/09/2016); Cystoscopy with ureteroscopy and stent placement (Right, 08/27/2017); laparoscopic appendectomy (N/A, 12/18/2017); Appendectomy; and Back surgery (Bilateral, 2019). Family: family history includes Cancer in his maternal grandmother, mother, sister, and sister; Heart disease in his father; Stroke in his father.  Laboratory Chemistry Profile   Renal Lab Results  Component Value Date   BUN 16 12/22/2019   CREATININE 1.36 (H) 12/22/2019   LABCREA 182 02/24/2016   BCR 14 03/07/2017   GFRAA >60 12/22/2019   GFRNONAA 55 (L) 12/22/2019     Hepatic Lab Results  Component Value Date   AST 29 12/22/2019   ALT 36 12/22/2019   ALBUMIN 4.3 12/22/2019   ALKPHOS 68 12/22/2019   LIPASE 46 12/03/2018     Electrolytes Lab Results  Component Value Date   NA 138 12/22/2019   K 3.4 (L) 12/22/2019   CL 98 12/22/2019   CALCIUM 9.6 12/22/2019  MG 2.0 11/29/2016     Bone Lab Results  Component Value Date   25OHVITD1 24 (L) 11/29/2016   25OHVITD2 <1.0 11/29/2016   25OHVITD3 24 11/29/2016     Inflammation (CRP: Acute Phase) (ESR: Chronic Phase) Lab Results  Component Value Date   CRP 1.8 11/29/2016   ESRSEDRATE 11 11/29/2016       Note: Above Lab results reviewed.  Recent Imaging Review  DG PAIN CLINIC C-ARM 1-60 MIN NO REPORT Fluoro was used, but no Radiologist interpretation will be provided.  Please refer to "NOTES" tab for provider progress note. Note: Reviewed        Physical Exam  General appearance: Well nourished, well developed,  and well hydrated. In no apparent acute distress Mental status: Alert, oriented x 3 (person, place, & time)       Respiratory: No evidence of acute respiratory distress Eyes: PERLA Vitals: BP (!) 143/92    Pulse (!) 105    Temp 98.2 F (36.8 C)    Ht '5\' 7"'  (1.702 m)    Wt 185 lb (83.9 kg)    SpO2 96%    BMI 28.98 kg/m  BMI: Estimated body mass index is 28.98 kg/m as calculated from the following:   Height as of this encounter: '5\' 7"'  (1.702 m).   Weight as of this encounter: 185 lb (83.9 kg). Ideal: Ideal body weight: 66.1 kg (145 lb 11.6 oz) Adjusted ideal body weight: 73.2 kg (161 lb 6.9 oz)  Assessment   Status Diagnosis  Controlled Controlled Controlled 1. Chronic pain syndrome   2. Chronic bilateral low back pain with bilateral sciatica   3. Chronic neck pain (Bilateral) (R>L)   4. Pharmacologic therapy   5. Abnormal MRI, lumbar spine (12/23/2019)      Updated Problems: No problems updated.  Plan of Care  Problem-specific:  No problem-specific Assessment & Plan notes found for this encounter.  Mr. COLEY KULIKOWSKI has a current medication list which includes the following long-term medication(s): albuterol, atorvastatin, chlorthalidone, melatonin, morphine, [START ON 04/01/2020] morphine, [START ON 05/01/2020] morphine, and potassium chloride.  Pharmacotherapy (Medications Ordered): No orders of the defined types were placed in this encounter.  Orders:  Orders Placed This Encounter  Procedures   Ambulatory referral to Neurosurgery    Referral Priority:   Routine    Referral Type:   Surgical    Referral Reason:   Specialty Services Required    Requested Specialty:   Neurosurgery    Number of Visits Requested:   1   Nursing Instructions:    Please complete this patient's postprocedure evaluation.    Scheduling Instructions:     Please complete this patient's postprocedure evaluation.   Follow-up plan:   Return in about 2 months (around 05/31/2020) for (F2F),  (Med Mgmt).      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   Considering:   Diagnostic bilateral cervical facet block Possible bilateral cervical facet RFA  Diagnostic right greater occipital NB Possible right GON RFA  Possible right GON peripheral nerve stimulator trial  Diagnostic right CESI  Diagnostic bilateral IA shoulder injection Diagnostic bilateral suprascapular NB Possible bilateral suprascapular nerve RFA  Diagnostic bilateral SI joint block Possible bilateral SI joint RFA  Diagnostic bilateral lumbar facet block Possible bilateral lumbar facet RFA  Diagnostic left L4 TFESI  Diagnostic left L4-5 LESI  Diagnostic left IA hip  joint injection Possible left hip joint RFA  Diagnostic bilateral median nerve block(carpal tunnel block)  Diagnostic right CPB   Palliative PRN treatment(s):   Palliative/Diagnostic left SI Block (Area of L5 Transverse process spatulation) #2  Palliative IM Toradol/Norflex 60/60 (PRN)     Recent Visits Date Type Provider Dept  02/08/20 Procedure visit Gillis Santa, MD [facility]  02/03/20 Office Visit Milinda Pointer, MD [facility]  01/05/20 Procedure visit Milinda Pointer, MD [facility]  12/30/19 Office Visit Milinda Pointer, MD [facility]  Showing recent visits within past 90 days and meeting all other requirements Today's Visits Date Type Provider Dept  03/16/20 Office Visit Milinda Pointer, MD [facility]  Showing today's visits and meeting all other requirements Future Appointments Date Type Provider Dept  05/30/20 Appointment Milinda Pointer, MD [facility]  Showing future appointments within next 90 days and meeting all other requirements  I discussed the  assessment and treatment plan with the patient. The patient was provided an opportunity to ask questions and all were answered. The patient agreed with the plan and demonstrated an understanding of the instructions.  Patient advised to call back or seek an in-person evaluation if the symptoms or condition worsens.  Duration of encounter: 27 minutes.  Note by: Gaspar Cola, MD Date: 03/16/2020; Time: 8:23 AM

## 2020-03-16 ENCOUNTER — Other Ambulatory Visit: Payer: Self-pay

## 2020-03-16 ENCOUNTER — Ambulatory Visit: Payer: Medicare Other | Attending: Pain Medicine | Admitting: Pain Medicine

## 2020-03-16 ENCOUNTER — Encounter: Payer: Self-pay | Admitting: Pain Medicine

## 2020-03-16 VITALS — BP 143/92 | HR 105 | Temp 98.2°F | Ht 67.0 in | Wt 185.0 lb

## 2020-03-16 DIAGNOSIS — G894 Chronic pain syndrome: Secondary | ICD-10-CM | POA: Diagnosis not present

## 2020-03-16 DIAGNOSIS — Z79899 Other long term (current) drug therapy: Secondary | ICD-10-CM | POA: Diagnosis not present

## 2020-03-16 DIAGNOSIS — M5441 Lumbago with sciatica, right side: Secondary | ICD-10-CM | POA: Insufficient documentation

## 2020-03-16 DIAGNOSIS — M5442 Lumbago with sciatica, left side: Secondary | ICD-10-CM | POA: Insufficient documentation

## 2020-03-16 DIAGNOSIS — G8929 Other chronic pain: Secondary | ICD-10-CM | POA: Insufficient documentation

## 2020-03-16 DIAGNOSIS — R937 Abnormal findings on diagnostic imaging of other parts of musculoskeletal system: Secondary | ICD-10-CM | POA: Diagnosis not present

## 2020-03-16 DIAGNOSIS — M542 Cervicalgia: Secondary | ICD-10-CM | POA: Diagnosis not present

## 2020-03-16 NOTE — Progress Notes (Signed)
Safety precautions to be maintained throughout the outpatient stay will include: orient to surroundings, keep bed in low position, maintain call bell within reach at all times, provide assistance with transfer out of bed and ambulation.  

## 2020-03-16 NOTE — Patient Instructions (Signed)
____________________________________________________________________________________________  Medication Rules  Purpose: To inform patients, and their family members, of our rules and regulations.  Applies to: All patients receiving prescriptions (written or electronic).  Pharmacy of record: Pharmacy where electronic prescriptions will be sent. If written prescriptions are taken to a different pharmacy, please inform the nursing staff. The pharmacy listed in the electronic medical record should be the one where you would like electronic prescriptions to be sent.  Electronic prescriptions: In compliance with the [place] Strengthen Opioid Misuse Prevention (STOP) Act of 2017 (Session Law 2017-74/H243), effective June 11, 2018, all controlled substances must be electronically prescribed. Calling prescriptions to the pharmacy will cease to exist.  Prescription refills: Only during scheduled appointments. Applies to all prescriptions.  NOTE: The following applies primarily to controlled substances (Opioid* Pain Medications).   Type of encounter (visit): For patients receiving controlled substances, face-to-face visits are required. (Not an option or up to the patient.)  Patient's responsibilities: 1. Pain Pills: Bring all pain pills to every appointment (except for procedure appointments). 2. Pill Bottles: Bring pills in original pharmacy bottle. Always bring the newest bottle. Bring bottle, even if empty. 3. Medication refills: You are responsible for knowing and keeping track of what medications you take and those you need refilled. The day before your appointment: write a list of all prescriptions that need to be refilled. The day of the appointment: give the list to the admitting nurse. Prescriptions will be written only during appointments. No prescriptions will be written on procedure days. If you forget a medication: it will not be "Called in", "Faxed", or "electronically sent".  You will need to get another appointment to get these prescribed. No early refills. Do not call asking to have your prescription filled early. 4. Prescription Accuracy: You are responsible for carefully inspecting your prescriptions before leaving our office. Have the discharge nurse carefully go over each prescription with you, before taking them home. Make sure that your name is accurately spelled, that your address is correct. Check the name and dose of your medication to make sure it is accurate. Check the number of pills, and the written instructions to make sure they are clear and accurate. Make sure that you are given enough medication to last until your next medication refill appointment. 5. Taking Medication: Take medication as prescribed. When it comes to controlled substances, taking less pills or less frequently than prescribed is permitted and encouraged. Never take more pills than instructed. Never take medication more frequently than prescribed.  6. Inform other Doctors: Always inform, all of your healthcare providers, of all the medications you take. 7. Pain Medication from other Providers: You are not allowed to accept any additional pain medication from any other Doctor or Healthcare provider. There are two exceptions to this rule. (see below) In the event that you require additional pain medication, you are responsible for notifying us, as stated below. 8. Medication Agreement: You are responsible for carefully reading and following our Medication Agreement. This must be signed before receiving any prescriptions from our practice. Safely store a copy of your signed Agreement. Violations to the Agreement will result in no further prescriptions. (Additional copies of our Medication Agreement are available upon request.) 9. Laws, Rules, & Regulations: All patients are expected to follow all Federal and State Laws, Statutes, Rules, & Regulations. Ignorance of the Laws does not constitute a  valid excuse.  10. Illegal drugs and Controlled Substances: The use of illegal substances (including, but not limited to marijuana and its   derivatives) and/or the illegal use of any controlled substances is strictly prohibited. Violation of this rule may result in the immediate and permanent discontinuation of any and all prescriptions being written by our practice. The use of any illegal substances is prohibited. 11. Adopted CDC guidelines & recommendations: Target dosing levels will be at or below 60 MME/day. Use of benzodiazepines** is not recommended.  Exceptions: There are only two exceptions to the rule of not receiving pain medications from other Healthcare Providers. 1. Exception #1 (Emergencies): In the event of an emergency (i.e.: accident requiring emergency care), you are allowed to receive additional pain medication. However, you are responsible for: As soon as you are able, call our office [phone], at any time of the day or night, and leave a message stating your name, the date and nature of the emergency, and the name and dose of the medication prescribed. In the event that your call is answered by a member of our staff, make sure to document and save the date, time, and the name of the person that took your information.  2. Exception #2 (Planned Surgery): In the event that you are scheduled by another doctor or dentist to have any type of surgery or procedure, you are allowed (for a period no longer than 30 days), to receive additional pain medication, for the acute post-op pain. However, in this case, you are responsible for picking up a copy of our "Post-op Pain Management for Surgeons" handout, and giving it to your surgeon or dentist. This document is available at our office, and does not require an appointment to obtain it. Simply go to our office during business hours (Monday-Thursday from 8:00 AM to 4:00 PM) (Friday 8:00 AM to 12:00 Noon) or if you have a scheduled appointment  with us, prior to your surgery, and ask for it by name. In addition, you are responsible for: calling our office [phone], at any time of the day or night, and leaving a message stating your name, name of your surgeon, type of surgery, and date of procedure or surgery. Failure to comply with your responsibilities may result in termination of therapy involving the controlled substances.  *Opioid medications include: morphine, codeine, oxycodone, oxymorphone, hydrocodone, hydromorphone, meperidine, tramadol, tapentadol, buprenorphine, fentanyl, methadone. **Benzodiazepine medications include: diazepam (Valium), alprazolam (Xanax), clonazepam (Klonopine), lorazepam (Ativan), clorazepate (Tranxene), chlordiazepoxide (Librium), estazolam (Prosom), oxazepam (Serax), temazepam (Restoril), triazolam (Halcion) (Last updated: 02/16/2020) ____________________________________________________________________________________________   ____________________________________________________________________________________________  Medication Recommendations and Reminders  Applies to: All patients receiving prescriptions (written and/or electronic).  Medication Rules & Regulations: These rules and regulations exist for your safety and that of others. They are not flexible and neither are we. Dismissing or ignoring them will be considered "non-compliance" with medication therapy, resulting in complete and irreversible termination of such therapy. (See document titled "Medication Rules" for more details.) In all conscience, because of safety reasons, we cannot continue providing a therapy where the patient does not follow instructions.  Pharmacy of record:   Definition: This is the pharmacy where your electronic prescriptions will be sent.   We do not endorse any particular pharmacy, however, we have experienced problems with [facility] not securing enough medication supply for the community.  We do not  restrict you in your choice of pharmacy. However, once we write for your prescriptions, we will NOT be re-sending more prescriptions to fix restricted supply problems created by your pharmacy, or your insurance.   The pharmacy listed in the electronic medical record should be the   one where you want electronic prescriptions to be sent.  If you choose to change pharmacy, simply notify our nursing staff.  Recommendations:  Keep all of your pain medications in a safe place, under lock and key, even if you live alone. We will NOT replace lost, stolen, or damaged medication.  After you fill your prescription, take 1 week's worth of pills and put them away in a safe place. You should keep a separate, properly labeled bottle for this purpose. The remainder should be kept in the original bottle. Use this as your primary supply, until it runs out. Once it's gone, then you know that you have 1 week's worth of medicine, and it is time to come in for a prescription refill. If you do this correctly, it is unlikely that you will ever run out of medicine.  To make sure that the above recommendation works, it is very important that you make sure your medication refill appointments are scheduled at least 1 week before you run out of medicine. To do this in an effective manner, make sure that you do not leave the office without scheduling your next medication management appointment. Always ask the nursing staff to show you in your prescription , when your medication will be running out. Then arrange for the receptionist to get you a return appointment, at least 7 days before you run out of medicine. Do not wait until you have 1 or 2 pills left, to come in. This is very poor planning and does not take into consideration that we may need to cancel appointments due to bad weather, sickness, or emergencies affecting our staff.  DO NOT ACCEPT A "Partial Fill": If for any reason your pharmacy does not have enough pills/tablets  to completely fill or refill your prescription, do not allow for a "partial fill". The law allows the pharmacy to complete that prescription within 72 hours, without requiring a new prescription. If they do not fill the rest of your prescription within those 72 hours, you will need a separate prescription to fill the remaining amount, which we will NOT provide. If the reason for the partial fill is your insurance, you will need to talk to the pharmacist about payment alternatives for the remaining tablets, but again, DO NOT ACCEPT A PARTIAL FILL, unless you can trust your pharmacist to obtain the remainder of the pills within 72 hours.  Prescription refills and/or changes in medication(s):   Prescription refills, and/or changes in dose or medication, will be conducted only during scheduled medication management appointments. (Applies to both, written and electronic prescriptions.)  No refills on procedure days. No medication will be changed or started on procedure days. No changes, adjustments, and/or refills will be conducted on a procedure day. Doing so will interfere with the diagnostic portion of the procedure.  No phone refills. No medications will be "called into the pharmacy".  No Fax refills.  No weekend refills.  No Holliday refills.  No after hours refills.  Remember:  Business hours are:  Monday to Thursday 8:00 AM to 4:00 PM Provider's Schedule: Milinda Pointer, MD - Appointments are:  Medication management: Monday and Wednesday 8:00 AM to 4:00 PM Procedure day: Tuesday and Thursday 7:30 AM to 4:00 PM Gillis Santa, MD - Appointments are:  Medication management: Tuesday and Thursday 8:00 AM to 4:00 PM Procedure day: Monday and Wednesday 7:30 AM to 4:00 PM (Last update: 12/30/2019) ____________________________________________________________________________________________   ____________________________________________________________________________________________  CBD  (cannabidiol) WARNING  Applicable to: All individuals currently  taking or considering taking CBD (cannabidiol) and, more important, all patients taking opioid analgesic controlled substances (pain medication). (Example: oxycodone; oxymorphone; hydrocodone; hydromorphone; morphine; methadone; tramadol; tapentadol; fentanyl; buprenorphine; butorphanol; dextromethorphan; meperidine; codeine; etc.)  Legal status: CBD remains a Schedule I drug prohibited for any use. CBD is illegal with one exception. In the [place], CBD has a limited Food and Drug Administration (FDA) approval for the treatment of two specific types of epilepsy disorders. Only one CBD product has been approved by the FDA for this purpose: "Epidiolex". FDA is aware that some companies are marketing products containing cannabis and cannabis-derived compounds in ways that violate the Federal Food, Drug and Cosmetic Act (FD&C Act) and that may put the health and safety of consumers at risk. The FDA, a Federal agency, has not enforced the CBD status since 2018.   Legality: Some manufacturers ship CBD products nationally, which is illegal. Often such products are sold online and are therefore available throughout the country. CBD is openly sold in head shops and health food stores in some states where such sales have not been explicitly legalized. Selling unapproved products with unsubstantiated therapeutic claims is not only a violation of the law, but also can put patients at risk, as these products have not been proven to be safe or effective. Federal illegality makes it difficult to conduct research on CBD.  Reference: "FDA Regulation of Cannabis and Cannabis-Derived Products, Including Cannabidiol (CBD)" - https://www.fda.gov/news-events/public-health-focus/fda-regulation-cannabis-and-cannabis-derived-products-including-cannabidiol-cbd  Warning: CBD is not FDA approved and has not undergo the same manufacturing controls as prescription  drugs.  This means that the purity and safety of available CBD may be questionable. Most of the time, despite manufacturer's claims, it is contaminated with THC (delta-9-tetrahydrocannabinol - the chemical in marijuana responsible for the "HIGH").  When this is the case, the THC contaminant will trigger a positive urine drug screen (UDS) test for Marijuana (carboxy-THC). Because a positive UDS for any illicit substance is a violation of our medication agreement, your opioid analgesics (pain medicine) may be permanently discontinued.  MORE ABOUT CBD  General Information: CBD  is a derivative of the Marijuana (cannabis sativa) plant discovered in 1940. It is one of the 113 identified substances found in Marijuana. It accounts for up to 40% of the plant's extract. As of 2018, preliminary clinical studies on CBD included research for the treatment of anxiety, movement disorders, and pain. CBD is available and consumed in multiple forms, including inhalation of smoke or vapor, as an aerosol spray, and by mouth. It may be supplied as an oil containing CBD, capsules, dried cannabis, or as a liquid solution. CBD is thought not to be as psychoactive as THC (delta-9-tetrahydrocannabinol - the chemical in marijuana responsible for the "HIGH"). Studies suggest that CBD may interact with different biological target receptors in the body, including cannabinoid and other neurotransmitter receptors. As of 2018 the mechanism of action for its biological effects has not been determined.  Side-effects  Adverse reactions: Dry mouth, diarrhea, decreased appetite, fatigue, drowsiness, malaise, weakness, sleep disturbances, and others.  Drug interactions: CBC may interact with other medications such as blood-thinners. (Last update: 01/16/2020) ____________________________________________________________________________________________    

## 2020-03-24 ENCOUNTER — Emergency Department (HOSPITAL_COMMUNITY)
Admission: EM | Admit: 2020-03-24 | Discharge: 2020-03-24 | Disposition: A | Discharge status: Home / Self Care | Payer: Medicare Other | Attending: Emergency Medicine | Admitting: Emergency Medicine

## 2020-03-24 ENCOUNTER — Encounter (HOSPITAL_COMMUNITY): Payer: Self-pay | Admitting: Emergency Medicine

## 2020-03-24 ENCOUNTER — Other Ambulatory Visit: Payer: Self-pay

## 2020-03-24 ENCOUNTER — Emergency Department (HOSPITAL_COMMUNITY): Payer: Medicare Other

## 2020-03-24 ENCOUNTER — Telehealth (HOSPITAL_COMMUNITY): Payer: Self-pay | Admitting: Emergency Medicine

## 2020-03-24 DIAGNOSIS — M5432 Sciatica, left side: Secondary | ICD-10-CM | POA: Diagnosis not present

## 2020-03-24 DIAGNOSIS — M5431 Sciatica, right side: Secondary | ICD-10-CM | POA: Diagnosis not present

## 2020-03-24 DIAGNOSIS — G8929 Other chronic pain: Secondary | ICD-10-CM

## 2020-03-24 DIAGNOSIS — E1140 Type 2 diabetes mellitus with diabetic neuropathy, unspecified: Secondary | ICD-10-CM | POA: Insufficient documentation

## 2020-03-24 DIAGNOSIS — I10 Essential (primary) hypertension: Secondary | ICD-10-CM | POA: Insufficient documentation

## 2020-03-24 DIAGNOSIS — M545 Low back pain, unspecified: Secondary | ICD-10-CM | POA: Diagnosis not present

## 2020-03-24 DIAGNOSIS — M5459 Other low back pain: Secondary | ICD-10-CM | POA: Diagnosis not present

## 2020-03-24 DIAGNOSIS — M5442 Lumbago with sciatica, left side: Secondary | ICD-10-CM

## 2020-03-24 DIAGNOSIS — Z79899 Other long term (current) drug therapy: Secondary | ICD-10-CM | POA: Diagnosis not present

## 2020-03-24 MED ORDER — DEXAMETHASONE SODIUM PHOSPHATE 10 MG/ML IJ SOLN
8.0000 mg | Freq: Once | INTRAMUSCULAR | Status: AC
  Administered 2020-03-24: 8 mg via INTRAMUSCULAR
  Filled 2020-03-24: qty 1

## 2020-03-24 MED ORDER — OXYCODONE-ACETAMINOPHEN 5-325 MG PO TABS
1.0000 | ORAL_TABLET | Freq: Once | ORAL | Status: AC
  Administered 2020-03-24: 1 via ORAL
  Filled 2020-03-24: qty 1

## 2020-03-24 MED ORDER — OXYCODONE-ACETAMINOPHEN 5-325 MG PO TABS
2.0000 | ORAL_TABLET | Freq: Four times a day (QID) | ORAL | 0 refills | Status: DC | PRN
Start: 2020-03-24 — End: 2020-05-30

## 2020-03-24 MED ORDER — OXYCODONE-ACETAMINOPHEN 5-325 MG PO TABS: 2.0000 | ORAL_TABLET | Freq: Four times a day (QID) | ORAL | 0 refills | Status: DC | PRN

## 2020-03-24 MED ORDER — OXYCODONE-ACETAMINOPHEN 5-325 MG PO TABS: 1.0000 | ORAL_TABLET | Freq: Three times a day (TID) | ORAL | 0 refills | Status: DC | PRN

## 2020-03-24 NOTE — ED Provider Notes (Signed)
[facility] EMERGENCY DEPARTMENT Provider Note   CSN: [id number] Arrival date & time: 03/24/20  1519     History Chief Complaint  Patient presents with  . Back Pain    Howard Murphy is a 64 y.o. male w/past medical history of arthritis, diabetes, hyperlipidemia, hypertension, nephrolithiasis, back pain that presents emerged department today for back pain.  Patient states that he has had back pain since July, got MRI then which showed decompression and fusion at L2-L3 and L5-S1 without stenosis.  There was also some disease at L1-L2 with moderate stenosis.  Patient is having tenderness to back, and lumbar region which spans throughout his back.  Is also having numbness and tingling down bilateral legs, states that this is not new.  No urinary incontinence, bowel incontinence, fevers, saddle paresthesias, foot drop, no weakness.  Patient is walking normally.  Patient does have appointment with Ortho surgeon next week, states that he cannot wait for the appointment therefore came here..  Patient goes to pain clinic has been getting morphine, ran out of prescription 3 days ago when he started having pain.  States that pain has worsened over the past 3 days, feels like his previous pain when he got diagnosed with the stenosis in July.  Per chart review patient has had surgery to his back in 2019 due to lumbar spinal stenosis, did have lumbar fusion of L2 S2 in 2019.  No recent trauma.  No recent falls.  HPI     Past Medical History:  Diagnosis Date  . 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  . Back ache 05/05/2013  . Diabetes mellitus without complication ([place])   . GERD (gastroesophageal reflux disease)   . Headache   . History of kidney stones   . Hyperlipemia   . Hypertension   . Kidney stones   . Kidney stones   . Nephrolithiasis   . Shortness of breath dyspnea     Patient Active Problem List   Diagnosis Date Noted  . Uncomplicated opioid dependence ([place])  02/20/2020  . DDD (degenerative disc disease), lumbar 02/03/2020  . Grade 1 Retrolisthesis of L1/L2 01/05/2020  . Lumbar disc extrusion with 7 mm caudal migration (L1-2) 01/05/2020  . Lumbosacral radiculopathy at L1 (Right) 01/05/2020  . Groin pain (Right) 01/05/2020  . Neural foraminal stenosis of lumbar spine (L1-2) (Right) 01/05/2020  . DDD (degenerative disc disease), lumbosacral 01/05/2020  . Acute exacerbation of chronic low back pain 12/30/2019  . Pharmacologic therapy 11/25/2019  . Disorder of skeletal system 11/25/2019  . Problems influencing health status 11/25/2019  . Osteoarthritis involving multiple joints 02/23/2019  . Chronic musculoskeletal pain 02/23/2019  . Insomnia secondary to chronic pain 02/23/2019  . Neurogenic claudication ([place]) 04/02/2018  . Chronic bilateral low back pain with bilateral sciatica 03/05/2018  . Spondylosis without myelopathy or radiculopathy, lumbosacral region 01/14/2018  . Other specified dorsopathies, sacral and sacrococcygeal region 12/26/2017  . Acute appendicitis 12/18/2017  . Medication monitoring encounter 03/07/2017  . 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 Spondylolisthesis at L3-L4 level 08/03/2016  . Chronic pain syndrome 07/09/2016  . Encounter for medication monitoring 02/24/2016  . Cervical spondylosis 12/17/2015  . Cervical facet arthropathy 12/17/2015  . Chronic neck pain (Bilateral) (R>L) 12/17/2015  . Cervical facet syndrome (Bilateral) (R>L) 12/17/2015  . Chronic upper extremity pain (Bilateral) (R>L) 12/17/2015  . Cervical foraminal stenosis 12/17/2015  .  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  Arthropathy of shoulder (Right) 12/17/2015  . 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 Lumbar foraminal stenosis  (Left: L4-5) (Right: L1-2) 12/17/2015  . Grade 1 Anterolisthesis (2 mm) of L4 over L5. 12/17/2015   . Lumbar facet (L4-5) synovial cyst (6 mm) (Left) 12/17/2015  . Chronic sacroiliac joint pain (Bilateral) (L>R) 12/08/2015  . Neurogenic pain 12/08/2015  . Abnormal MRI, lumbar spine (12/23/2019) 11/21/2015  . Radicular pain of shoulder (Right) 10/03/2015  . Carpal tunnel syndrome (Bilateral) 10/03/2015  . Chronic cervical radicular pain (Right) 10/03/2015  . Chronic low back pain (1ry area of Pain) (Bilateral) (L>R) w/o sciatica 09/06/2015  . Opiate use (60 MME/Day) 09/05/2015  . Long term prescription opiate use 09/05/2015  . Long term current use of opiate analgesic 09/05/2015  . Spondylosis of lumbar spine 09/05/2015  . Lumbar facet joint syndrome ([place]) 09/05/2015  . Chronic hip pain (Left) 09/05/2015  . Diabetic peripheral neuropathy ([place]) 09/05/2015  . Chronic lumbar radicular pain (S1 Dermatome) (Bilateral) (L>R) 09/05/2015  . Hypokalemia 09/05/2015  . Chronic shoulder pain (2ry area of Pain) (Bilateral) (L>R) 09/05/2015  . Diabetes ([place]) 09/05/2015  . Chronic foot pain (3ry area of Pain) (Bilateral) (L>R) 09/05/2015  . Chronic hand pain (4th area of Pain) (Bilateral) (R>L) 09/05/2015  . Lumbar facet hypertrophy 09/05/2015  . Encounter for therapeutic drug level monitoring 09/05/2015  . Low testosterone 09/05/2015  . Occipital pain (Right) 09/05/2015  . 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 Hypertriglyceridemia 07/26/2015  . Chronic abdominal pain (RUQ) 07/14/2015  . Type 2 diabetes mellitus with peripheral neuropathy ([place]) 07/14/2015  . Chest pain 07/14/2015  . Complete tear of the shoulder rotator cuff (Left) 05/10/2015  . Complete tear of left rotator cuff 05/10/2015  . SOB (shortness of breath) 03/22/2015  . Elevated hemoglobin ([place]) 07/07/2013  . History of lumbar facet Synovial cyst, surgically removed. 05/29/2013    Class: History of  . 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 GERD (gastroesophageal reflux disease) 05/05/2013  . Benign prostatic  hyperplasia 05/05/2013  . Hypertension 05/08/2011  . Hyperlipidemia 05/08/2011    Past Surgical History:  Procedure Laterality Date  . APPENDECTOMY    . BACK SURGERY  2015   x3  . BACK SURGERY  08/09/2016  . BACK SURGERY Bilateral 2019  . CHOLECYSTECTOMY  2000  . COLONOSCOPY WITH PROPOFOL N/A 04/11/2015   Procedure: COLONOSCOPY WITH PROPOFOL;  Surgeon: Manya Silvas, MD;  Location: [facility] ENDOSCOPY;  Service: Endoscopy;  Laterality: N/A;  . CYSTOSCOPY WITH URETEROSCOPY AND STENT PLACEMENT Right 08/27/2017   Procedure: CYSTOSCOPY WITH URETEROSCOPY AND STENT PLACEMENT;  Surgeon: Abbie Sons, MD;  Location: [facility] ORS;  Service: Urology;  Laterality: Right;  . ESOPHAGOGASTRODUODENOSCOPY (EGD) WITH PROPOFOL N/A 04/11/2015   Procedure: ESOPHAGOGASTRODUODENOSCOPY (EGD) WITH PROPOFOL;  Surgeon: Manya Silvas, MD;  Location[facility] ENDOSCOPY;  Service: Endoscopy;  Laterality: N/A;  . LAPAROSCOPIC APPENDECTOMY N/A 12/18/2017   Procedure: APPENDECTOMY LAPAROSCOPIC;  Surgeon: Florene Glen, MD;  Location: [facility] ORS;  Service: General;  Laterality: N/A;  . SHOULDER SURGERY Right 2016  . SHOULDER SURGERY Left 06/2015       Family History  Problem Relation Age of Onset  . Cancer Mother        Breast CA  . Stroke Father   . Heart disease Father   . Cancer Sister   . Cancer Maternal Grandmother   . Cancer Sister   . Bladder Cancer Neg Hx   . Prostate cancer  Neg Hx   . Kidney cancer Neg Hx     Social History   Tobacco Use  . Smoking status: Never Smoker  . Smokeless tobacco: Former Systems developer    Types: Secondary school teacher  . Vaping Use: Never used  Substance Use Topics  . Alcohol use: No  . Drug use: No    Home Medications Prior to Admission medications   Medication Sig Start Date End Date Taking? Authorizing Provider  albuterol (PROVENTIL HFA;VENTOLIN HFA) 108 (90 Base) MCG/ACT inhaler Inhale 2 puffs into the lungs every 6 (six) hours as needed for wheezing or shortness of  breath. 05/29/17   Fisher, Linden Dolin, PA-C  atorvastatin (LIPITOR) 40 MG tablet Take 1 tablet (40 mg total) by mouth at bedtime. 03/08/17   Arnetha Courser, MD  canagliflozin (INVOKANA) 100 MG TABS tablet Take 100 mg by mouth daily before breakfast.    [provider]  chlorthalidone (HYGROTON) 50 MG tablet Take 50 mg by mouth daily.    [provider]  cholecalciferol (VITAMIN D) 25 MCG (1000 UT) tablet Take 1 tablet by mouth daily. 02/26/19   [provider]  cyclobenzaprine (FLEXERIL) 10 MG tablet Take 1 tablet (10 mg total) by mouth 3 (three) times daily as needed for muscle spasms. 12/23/19   Triplett, Cari B, FNP  JARDIANCE 25 MG TABS tablet Take 25 mg by mouth daily. 02/06/20   [provider]  lisinopril (ZESTRIL) 20 MG tablet Take 20 mg by mouth daily. 09/18/18   [provider]  meclizine (ANTIVERT) 25 MG tablet Take 25 mg by mouth every 6 (six) hours as needed for dizziness.    [provider]  Melatonin 10 MG CAPS Take 20 mg by mouth at bedtime. 02/25/20 08/23/20  Milinda Pointer, MD  morphine (MS CONTIN) 15 MG 12 hr tablet Take 1 tablet (15 mg total) by mouth in the morning, at noon, and at bedtime. Must last 30 days. Do not break tablet 03/02/20 04/01/20  Milinda Pointer, MD  morphine (MS CONTIN) 15 MG 12 hr tablet Take 1 tablet (15 mg total) by mouth in the morning, at noon, and at bedtime. Must last 30 days. Do not break tablet 04/01/20 05/01/20  Milinda Pointer, MD  morphine (MS CONTIN) 15 MG 12 hr tablet Take 1 tablet (15 mg total) by mouth in the morning, at noon, and at bedtime. Must last 30 days. Do not break tablet 05/01/20 05/31/20  Milinda Pointer, MD  ondansetron (ZOFRAN ODT) 4 MG disintegrating tablet Take 1 tablet (4 mg total) by mouth every 8 (eight) hours as needed for nausea or vomiting. 12/29/17   Darel Hong, MD  oxyCODONE-acetaminophen (PERCOCET/ROXICET) 5-325 MG tablet Take 1 tablet by mouth every 8 (eight)  hours as needed for severe pain. 03/24/20   Alfredia Client, PA-C  potassium chloride (K-DUR) 10 MEQ tablet Take 1 tablet (10 mEq total) by mouth daily. Patient taking differently: Take 10 mEq by mouth daily as needed (For low potassium when instructred by his doctor.).  08/23/17   Stoioff, Ronda Fairly, MD  tamsulosin (FLOMAX) 0.4 MG CAPS capsule Take 1 capsule (0.4 mg total) by mouth daily. 11/04/18   Billey Co, MD  testosterone cypionate (DEPOTESTOSTERONE CYPIONATE) 200 MG/ML injection Inject 200 mg into the muscle every 14 (fourteen) days. 10/07/18   [provider]    Allergies    Adhesive [tape]  Review of Systems   Review of Systems  Constitutional: Negative for diaphoresis, fatigue and fever.  Eyes: Negative for visual disturbance.  Respiratory: Negative for shortness of breath.   Cardiovascular: Negative for chest pain.  Gastrointestinal: Negative for nausea and vomiting.  Musculoskeletal: Positive for back pain. Negative for arthralgias, myalgias, neck pain and neck stiffness.  Skin: Negative for color change, pallor, rash and wound.  Neurological: Positive for numbness. Negative for syncope, weakness, light-headedness and headaches.  Psychiatric/Behavioral: Negative for behavioral problems and confusion.    Physical Exam Updated Vital Signs BP 122/78 (BP Location: Left Arm)   Pulse 70   Temp 98.7 F (37.1 C) (Oral)   Resp 15   SpO2 95%   Physical Exam Constitutional:      General: He is not in acute distress.    Appearance: Normal appearance. He is not ill-appearing, toxic-appearing or diaphoretic.  HENT:     Mouth/Throat:     Mouth: Mucous membranes are moist.     Pharynx: Oropharynx is clear.  Eyes:     General: No scleral icterus.    Extraocular Movements: Extraocular movements intact.     Pupils: Pupils are equal, round, and reactive to light.  Cardiovascular:     Rate and Rhythm: Normal rate and regular rhythm.     Pulses: Normal pulses.     Heart  sounds: Normal heart sounds.  Pulmonary:     Effort: Pulmonary effort is normal. No respiratory distress.     Breath sounds: Normal breath sounds. No stridor. No wheezing, rhonchi or rales.  Chest:     Chest wall: No tenderness.  Abdominal:     General: Abdomen is flat. There is no distension.     Palpations: Abdomen is soft.     Tenderness: There is no abdominal tenderness. There is no guarding or rebound.  Musculoskeletal:        General: No swelling or tenderness. Normal range of motion.     Cervical back: Normal range of motion and neck supple. No rigidity.       Back:     Right lower leg: No edema.     Left lower leg: No edema.     Comments: Patient with tenderness to lumbar spine that spans all across back and crosses midline.  No true focal midline tenderness.  No erythema, warmth, rashes noted.  Patient is able to range back in all directions.  No objective numbness to back, groin, lower extremities.  Patient with normal strength throughout bilateral lower extremities.  Skin:    General: Skin is warm and dry.     Capillary Refill: Capillary refill takes less than 2 seconds.     Coloration: Skin is not pale.  Neurological:     General: No focal deficit present.     Mental Status: He is alert and oriented to person, place, and time.     Comments: Alert. Clear speech. No facial droop. CNIII-XII grossly intact. Bilateral upper and lower extremities' sensation grossly intact. 5/5 symmetric strength with grip strength and with plantar and dorsi flexion bilaterally.Negative Romberg sign. Gait is steady and intact    Psychiatric:        Mood and Affect: Mood normal.        Behavior: Behavior normal.     ED Results / Procedures / Treatments   Labs (all labs ordered are listed, but only abnormal results are displayed) Labs Reviewed - No data to display  EKG None  Radiology DG Lumbar Spine Complete  Result Date: 03/24/2020 CLINICAL DATA:  Low back pain and history of prior  surgery,  initial encounter EXAM: LUMBAR SPINE - COMPLETE 4+ VIEW COMPARISON:  04/04/2018 FINDINGS: Postsurgical changes are again seen extending from L2-S2 with posterior fixation and interbody fusion at L2-3, L3-4 and L4-5. Osteophytic changes are seen. No compression deformity is noted. Increase in disc space narrowing at L1-2 is seen. No soft tissue abnormality is noted. IMPRESSION: Slight increase in the degree of degenerative change at L1-2. Postsurgical changes are noted and stable. Electronically Signed   By: Inez Catalina M.D.   On: 03/24/2020 18:34    Procedures Procedures (including critical care time)  Medications Ordered in ED Medications  oxyCODONE-acetaminophen (PERCOCET/ROXICET) 5-325 MG per tablet 1 tablet (1 tablet Oral Given 03/24/20 1745)  dexamethasone (DECADRON) injection 8 mg (8 mg Intramuscular Given 03/24/20 1853)    ED Course  I have reviewed the triage vital signs and the nursing notes.  Pertinent labs & imaging results that were available during my care of the patient were reviewed by me and considered in my medical decision making (see chart for details).    MDM Rules/Calculators/A&P                         Howard Murphy is a 64 y.o. male) past medical history of arthritis, diabetes, hyperlipidemia, hypertension, nephrolithiasis, back pain that presents emerged department today for back pain.  Tenderness spreads throughout back, will obtain imaging at this time since patient does have past history of hardware.  Numbness and tingling is not new, shared decision-making about MRI, do not think this is necessary at this time since patient is not having any red flag symptoms.  Patient is nontoxic appearing, vitals are WNL. Patient has normal neurologic exam,Ambulatory in the ED.  No back pain red flags(Denies saddle anesthesia, incontinence to bowel/bladder, fever, chills, IV drug use, dysuria, or hx of cancer.. No urinary sxs. Considered UTI/pyelonephritis, kidney stone,  aortic aneurysm/dissection, cauda equina or epidural abscess however these do not feel these diagnoses fit clinical picture at this time.  Plain films with no acute disease, pain controlled with Percocet, will give steroids at this time.  Think patient's pain began when he ran out of morphine, coming in for pain control.  Patient states that he feels better with Percocet and steroids, patient to be discharged.  Patient does have appointment with Ortho surgeon next week, did discuss red flag symptoms oF cauda equina in depth, patient expressed understanding for when to come back to the emergency department.  Doubt need for further emergent work up at this time. I explained the diagnosis and have given explicit precautions to return to the ER including for any other new or worsening symptoms. The patient understands and accepts the medical plan as it's been dictated and I have answered their questions. Discharge instructions concerning home care and prescriptions have been given. The patient is STABLE and is discharged to home in good condition   I discussed this case with my attending physician who cosigned this note including patient's presenting symptoms, physical exam, and planned diagnostics and interventions. Attending physician stated agreement with plan or made changes to plan which were implemented.   Final Clinical Impression(s) / ED Diagnoses Final diagnoses:  Chronic bilateral low back pain with bilateral sciatica    Rx / DC Orders ED Discharge Orders         Ordered    oxyCODONE-acetaminophen (PERCOCET/ROXICET) 5-325 MG tablet  Every 8 hours PRN        03/24/20 1902  Alfredia Client, PA-C 03/24/20 1911    Wyvonnia Dusky, MD 03/25/20 1034

## 2020-03-24 NOTE — ED Provider Notes (Signed)
Patient returned to ED as pharmacy never received percocet prescription (I confirmed with pharmacist by phone).  We are having a systems issue with electronic prescriptions not going through.  He was added back into a room so I could provide a printed paper prescription for him.   Wyvonnia Dusky, MD 03/24/20 2214

## 2020-03-24 NOTE — Telephone Encounter (Signed)
Electronic prescription not received by pharmacy.  New encounter for paper prescription.

## 2020-03-24 NOTE — ED Notes (Signed)
Patient transported to X-ray 

## 2020-03-24 NOTE — ED Triage Notes (Signed)
Pt c/o back pain for a while not getting better, denies any urinary symptoms

## 2020-03-24 NOTE — Discharge Instructions (Addendum)
I need you to follow-up with your surgeon, please continue to take NSAIDs such as ibuprofen or naproxen for your pain, if the pain is severe you can take the Percocet prescribed.  Do note that there is Tylenol in your Percocet, do not take more than 4 g a day.  The steroid shot today will increase your blood sugar, keep an eye on this.  If you have any new or worsening concerning symptoms that we spoke about such as numbness and tingling in your groin, complete sensation loss in your legs, your peeing or pooping yourself, you cannot pee or poop please come back to the emergency department. I hope you feel better!  Please use the attached instructions.

## 2020-03-25 ENCOUNTER — Other Ambulatory Visit: Payer: Self-pay | Admitting: Neurosurgery

## 2020-03-25 DIAGNOSIS — M4316 Spondylolisthesis, lumbar region: Secondary | ICD-10-CM

## 2020-03-25 DIAGNOSIS — G894 Chronic pain syndrome: Secondary | ICD-10-CM

## 2020-03-25 DIAGNOSIS — M5136 Other intervertebral disc degeneration, lumbar region: Secondary | ICD-10-CM

## 2020-03-25 DIAGNOSIS — M1990 Unspecified osteoarthritis, unspecified site: Secondary | ICD-10-CM | POA: Diagnosis not present

## 2020-03-25 DIAGNOSIS — E785 Hyperlipidemia, unspecified: Secondary | ICD-10-CM | POA: Diagnosis not present

## 2020-03-31 ENCOUNTER — Telehealth: Payer: Self-pay | Admitting: *Deleted

## 2020-03-31 NOTE — Telephone Encounter (Signed)
Attempted to return call, message left. 

## 2020-04-01 NOTE — Telephone Encounter (Signed)
Patient is scheduled a MRI on 04-19-2020 and states he needs something for claustrophobia.  Will you please order?

## 2020-04-19 ENCOUNTER — Ambulatory Visit
Admission: RE | Admit: 2020-04-19 | Discharge: 2020-04-19 | Disposition: A | Discharge status: Home / Self Care | Payer: Medicare Other | Source: Ambulatory Visit | Attending: Neurosurgery | Admitting: Neurosurgery

## 2020-04-19 ENCOUNTER — Other Ambulatory Visit: Payer: Self-pay

## 2020-04-19 DIAGNOSIS — M47814 Spondylosis without myelopathy or radiculopathy, thoracic region: Secondary | ICD-10-CM | POA: Diagnosis not present

## 2020-04-19 DIAGNOSIS — M5136 Other intervertebral disc degeneration, lumbar region: Secondary | ICD-10-CM | POA: Diagnosis not present

## 2020-04-19 DIAGNOSIS — M4316 Spondylolisthesis, lumbar region: Secondary | ICD-10-CM

## 2020-04-19 DIAGNOSIS — R531 Weakness: Secondary | ICD-10-CM | POA: Diagnosis not present

## 2020-04-19 DIAGNOSIS — M4804 Spinal stenosis, thoracic region: Secondary | ICD-10-CM | POA: Diagnosis not present

## 2020-04-19 DIAGNOSIS — M48061 Spinal stenosis, lumbar region without neurogenic claudication: Secondary | ICD-10-CM | POA: Diagnosis not present

## 2020-04-19 DIAGNOSIS — G894 Chronic pain syndrome: Secondary | ICD-10-CM | POA: Diagnosis not present

## 2020-04-19 DIAGNOSIS — M5124 Other intervertebral disc displacement, thoracic region: Secondary | ICD-10-CM | POA: Diagnosis not present

## 2020-04-19 DIAGNOSIS — M5023 Other cervical disc displacement, cervicothoracic region: Secondary | ICD-10-CM | POA: Diagnosis not present

## 2020-04-25 DIAGNOSIS — E785 Hyperlipidemia, unspecified: Secondary | ICD-10-CM | POA: Diagnosis not present

## 2020-04-25 DIAGNOSIS — E1165 Type 2 diabetes mellitus with hyperglycemia: Secondary | ICD-10-CM | POA: Diagnosis not present

## 2020-04-27 DIAGNOSIS — M4316 Spondylolisthesis, lumbar region: Secondary | ICD-10-CM | POA: Diagnosis not present

## 2020-04-27 DIAGNOSIS — E119 Type 2 diabetes mellitus without complications: Secondary | ICD-10-CM | POA: Diagnosis not present

## 2020-04-27 DIAGNOSIS — M5136 Other intervertebral disc degeneration, lumbar region: Secondary | ICD-10-CM | POA: Diagnosis not present

## 2020-05-23 DIAGNOSIS — I10 Essential (primary) hypertension: Secondary | ICD-10-CM | POA: Diagnosis not present

## 2020-05-23 DIAGNOSIS — E1165 Type 2 diabetes mellitus with hyperglycemia: Secondary | ICD-10-CM | POA: Diagnosis not present

## 2020-05-23 DIAGNOSIS — E785 Hyperlipidemia, unspecified: Secondary | ICD-10-CM | POA: Diagnosis not present

## 2020-05-23 DIAGNOSIS — E291 Testicular hypofunction: Secondary | ICD-10-CM | POA: Diagnosis not present

## 2020-05-23 DIAGNOSIS — Z79899 Other long term (current) drug therapy: Secondary | ICD-10-CM | POA: Diagnosis not present

## 2020-05-23 DIAGNOSIS — F411 Generalized anxiety disorder: Secondary | ICD-10-CM | POA: Diagnosis not present

## 2020-05-29 NOTE — Progress Notes (Signed)
PROVIDER NOTE: Information contained herein reflects review and annotations entered in association with encounter. Interpretation of such information and data should be left to medically-trained personnel. Information provided to patient can be located elsewhere in the medical record under "Patient Instructions". Document created using STT-dictation technology, any transcriptional errors that may result from process are unintentional.    Patient: Howard Murphy  Service Category: E/M  Provider: Gaspar Cola, MD  DOB: 1955-09-23  DOS: 05/30/2020  Specialty: Interventional Pain Management  MRN: [id number]  Setting: Ambulatory outpatient  PCP: [facility]  Type: Established Patient    Referring Provider: [facility]  Location: Office  Delivery: Face-to-face     HPI  [name], a 64 y.o. year old male, is here today because of his Chronic pain syndrome [G89.4]. [name] primary complain today is Back Pain (lower) Last encounter: My last encounter with him was on 03/16/2020. Pertinent problems: [name] has History of lumbar facet Synovial cyst, surgically removed.; Chronic abdominal pain (RUQ); Type 2 diabetes mellitus with peripheral neuropathy ([place]); Spondylosis of lumbar spine; Lumbar facet joint syndrome ([place]); Chronic hip pain (Left); Diabetic peripheral neuropathy ([place]); Chronic lumbar radicular pain (S1 Dermatome) (Bilateral) (L>R); Complete tear of the shoulder rotator cuff (Left); Chronic shoulder pain (2ry area of Pain) (Bilateral) (L>R); Chronic foot pain (3ry area of Pain) (Bilateral) (L>R); Chronic hand pain (4th area of Pain) (Bilateral) (R>L); Lumbar facet hypertrophy; Occipital pain (Right); Failed back surgical syndrome 4; Chronic low back pain (1ry area of Pain) (Bilateral) (L>R) w/o sciatica; Radicular pain of shoulder (Right); Carpal tunnel syndrome (Bilateral); Chronic cervical radicular pain (Right); Abnormal MRI, lumbar spine  (12/23/2019); Chronic sacroiliac joint pain (Bilateral) (L>R); Neurogenic pain; Cervical spondylosis; Cervical facet arthropathy; Chronic neck pain (Bilateral) (R>L); Cervical facet syndrome (Bilateral) (R>L); Chronic upper extremity pain (Bilateral) (R>L); Cervical foraminal stenosis;  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, C5-6); Arthropathy of shoulder (Right); 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); Lumbar foraminal stenosis  (Left: L4-5) (Right: L1-2); Grade 1 Anterolisthesis (2 mm) of L4 over L5.; Lumbar facet (L4-5) synovial cyst (6 mm) (Left); Chronic pain syndrome; Spondylolisthesis at L3-L4 level; Complete tear of left rotator cuff; Bertolotti's syndrome (L5-S1) (Bilateral); 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); Other specified dorsopathies, sacral and sacrococcygeal region; Spondylosis without myelopathy or radiculopathy, lumbosacral region; Chronic bilateral low back pain with bilateral sciatica; Neurogenic claudication ([place]); Osteoarthritis involving multiple joints; Chronic musculoskeletal pain; Acute exacerbation of chronic low back pain; Grade 1 Retrolisthesis of L1/L2; Lumbar disc extrusion with 7 mm caudal migration (L1-2); Lumbosacral radiculopathy at L1 (Right); Groin pain (Right); 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 Pain Assessment: Severity of Chronic pain is reported as a 6 /10. Location: Back Lower/radiates down back of right leg to knee. Onset: More than a month ago. Quality: Aching,Sharp,Stabbing,Throbbing,Dull,Shooting. Timing: Constant. Modifying factor(s): denies. Vitals:  height is '5\' 7"'  (1.702 m) and weight is 176 lb (79.8 kg). His temperature is 97.3 F (36.3 C) (abnormal). His blood pressure is 132/90 and his pulse is 76. His oxygen saturation is 95%.   Reason for encounter: medication management.  The patient  indicates that he is still pending surgery on his lower back by Dr. Cari Caraway at the beginning of the year.  They are trying to bring his hemoglobin A1c down since it was between 9 and 10.  He refers that through diet, he has been  able to bring it down considerably.  Today I asked him if he had a long conversation with Dr. Cari Caraway explaining to him what his expectations were.  I reminded him that the primary concern of the neurosurgeons is with function however, pain relief is not necessarily the primary goal.  He indicated that Dr. Cari Caraway had told him that the surgery may help bring down some of the pain, but he was fairly sure that it would not completely eliminate all of it.  [name] is hoping that after the surgery he can go down on his opioid use however, I do not think that that will be as simple as it sounds since he has been using the MS Contin and he refers that he does not think that it is helping him even at 3 times a day.  He has also confessed that he has taken it 4 times a day, without our permission.  I reminded him that if he takes the medicine in a manner other than prescribed, he is taking risks that he is not aware of.  I reminded him that if he takes this type of medication in a manner other than prescribed, he can end up with respiratory depression and death.  This not the first time that he does this and we have had this conversation before.  Unfortunately, he is rather stubborn.  Because this does not happen all the time, I have not felt the need to completely suspend the therapy however if I see that he is noncompliant with the safety regulations, I may be forced to do this in the future.  Since he indicated that the morphine is not helping, today I have given him the choice to go back to the oxycodone, with the understanding that there may still be a national shortage on this and the one thing that I am not willing to do is to send prescriptions to the pharmacy to then find out  that they will have an adequate supply of the medicine and then have to resend the prescription elsewhere.  The patient was clearly warned that I will not be doing that should he find himself not being able to get his supply of medicine.  I have recommended that he have a long conversation with his pharmacist about this so that they can somehow guarantee and secure a supply of these medicines for him.  He refers that he has talked to the pharmacist and they told him that they have plenty of the "Percocet 10", meaning that they do not have the oxycodone IR, they only have the oxycodone/APAP.  I have given the patient the appropriate warning with regards to the acetaminophen and making sure that he does not take additional acetaminophen back and in the causing hepatotoxicity.  He indicated understanding.  RTCB: 08/29/2020 Nonopioids transferred 03/16/2021: Melatonin  Pharmacotherapy Assessment   Analgesic: Oxycodone IR 10 mg 1 tab PO q 8 hrs (30 mg/day of oxycodone) MME/day:13m/day.   Monitoring: [place] PMP: PDMP reviewed during this encounter.       Pharmacotherapy: No side-effects or adverse reactions reported. Compliance: No problems identified. Effectiveness: Clinically acceptable.  TDewayne Shorter RN  05/30/2020  9:06 AM  Signed Nursing Pain Medication Assessment:  Safety precautions to be maintained throughout the outpatient stay will include: orient to surroundings, keep bed in low position, maintain call bell within reach at all times, provide assistance with transfer out of bed and ambulation.  Medication Inspection Compliance: Pill count conducted under aseptic  conditions, in front of the patient. Neither the pills nor the bottle was removed from the patient's sight at any time. Once count was completed pills were immediately returned to the patient in their original bottle.  Medication: Morphine ER (MSContin) Pill/Patch Count: 0 of 90 pills remain Pill/Patch Appearance: Markings consistent  with prescribed medication Bottle Appearance: Standard pharmacy container. Clearly labeled. Filled Date: 11 /24 / 2021 Last Medication intake:  Ran out of medicine more than 48 hours ago    UDS:  Summary  Date Value Ref Range Status  12/02/2019 Note  Final    Comment:    ==================================================================== [employer] (MW) ==================================================================== Test                             Result       Flag       Units  Drug Present and Declared for Prescription Verification   Oxycodone                      6169         EXPECTED   ng/mg creat   Oxymorphone                    2086         EXPECTED   ng/mg creat   Noroxycodone                   4079         EXPECTED   ng/mg creat   Noroxymorphone                 645          EXPECTED   ng/mg creat    Sources of oxycodone are scheduled prescription medications.    Oxymorphone, noroxycodone, and noroxymorphone are expected    metabolites of oxycodone. Oxymorphone is also available as a    scheduled prescription medication.  ==================================================================== Test                      Result    Flag   Units      Ref Range   Creatinine              141              mg/dL      >=20 ==================================================================== Declared Medications:  The flagging and interpretation on this report are based on the  following declared medications.  Unexpected results may arise from  inaccuracies in the declared medications.   **Note: The testing scope of this panel includes these medications:   Oxycodone   **Note: The testing scope of this panel does not include the  following reported medications:   Albuterol (Ventolin HFA)  Atorvastatin (Lipitor)  Canagliflozin (Invokana)  Chlorthalidone (Hygroton)  Lisinopril (Zestril)  Meclizine (Antivert)  Melatonin  Ondansetron (Zofran)  Potassium  (Klor-Con)  Tamsulosin (Flomax)  Testosterone  Vitamin D ==================================================================== For clinical consultation, please call [phone]. ====================================================================      ROS  Constitutional: Denies any fever or chills Gastrointestinal: No reported hemesis, hematochezia, vomiting, or acute GI distress Musculoskeletal: Denies any acute onset joint swelling, redness, loss of ROM, or weakness Neurological: No reported episodes of acute onset apraxia, aphasia, dysarthria, agnosia, amnesia, paralysis, loss of coordination, or loss of consciousness  Medication Review  Melatonin, albuterol, atorvastatin, chlorthalidone, cholecalciferol, empagliflozin, ibuprofen, lisinopril, meclizine, morphine, ondansetron,  oxyCODONE-acetaminophen, sitaGLIPtin, tamsulosin, and testosterone cypionate  History Review  Allergy: Mr. Kapur is allergic to adhesive [tape]. Drug: Mr. Moradi  reports no history of drug use. Alcohol:  reports no history of alcohol use. Tobacco:  reports that he has never smoked. He quit smokeless tobacco use about 7 years ago.  His smokeless tobacco use included chew. Social: Mr. Jean  reports that he has never smoked. He quit smokeless tobacco use about 7 years ago.  His smokeless tobacco use included chew. He reports that he does not drink alcohol and does not use drugs. Medical:  has a past medical history of Arthritis, Back ache (05/05/2013), Diabetes mellitus without complication ([place]), GERD (gastroesophageal reflux disease), Headache, History of kidney stones, Hyperlipemia, Hypertension, Kidney stones, Kidney stones, Nephrolithiasis, and Shortness of breath dyspnea. Surgical: Mr. Korb  has a past surgical history that includes Cholecystectomy (2000); Shoulder surgery (Right, 2016); Colonoscopy with propofol (N/A, 04/11/2015); Esophagogastroduodenoscopy (egd) with propofol (N/A, 04/11/2015);  Shoulder surgery (Left, 06/2015); Back surgery (2015); Back surgery (08/09/2016); Cystoscopy with ureteroscopy and stent placement (Right, 08/27/2017); laparoscopic appendectomy (N/A, 12/18/2017); Appendectomy; and Back surgery (Bilateral, 2019). Family: family history includes Cancer in his maternal grandmother, mother, sister, and sister; Heart disease in his father; Stroke in his father.  Laboratory Chemistry Profile   Renal Lab Results  Component Value Date   BUN 16 12/22/2019   CREATININE 1.36 (H) 12/22/2019   LABCREA 182 02/24/2016   BCR 14 03/07/2017   GFRAA >60 12/22/2019   GFRNONAA 55 (L) 12/22/2019     Hepatic Lab Results  Component Value Date   AST 29 12/22/2019   ALT 36 12/22/2019   ALBUMIN 4.3 12/22/2019   ALKPHOS 68 12/22/2019   LIPASE 46 12/03/2018     Electrolytes Lab Results  Component Value Date   NA 138 12/22/2019   K 3.4 (L) 12/22/2019   CL 98 12/22/2019   CALCIUM 9.6 12/22/2019   MG 2.0 11/29/2016     Bone Lab Results  Component Value Date   25OHVITD1 24 (L) 11/29/2016   25OHVITD2 <1.0 11/29/2016   25OHVITD3 24 11/29/2016     Inflammation (CRP: Acute Phase) (ESR: Chronic Phase) Lab Results  Component Value Date   CRP 1.8 11/29/2016   ESRSEDRATE 11 11/29/2016       Note: Above Lab results reviewed.  Recent Imaging Review  MR THORACIC SPINE WO CONTRAST CLINICAL DATA:  Chronic mid and low back pain.  EXAM: MRI THORACIC SPINE WITHOUT CONTRAST  TECHNIQUE: Multiplanar, multisequence MR imaging of the thoracic spine was performed. No intravenous contrast was administered.  COMPARISON:  Concurrent CT L-spine.  FINDINGS: Alignment:  Normal.  Vertebrae: Minimal multilevel Modic type 2 endplate degenerative changes. Chronic anterior wedging deformity at the T11 level with approximately 10% height loss. No significant retropulsion. Remaining vertebral body heights are preserved. No bone marrow edema or focal osseous lesion. Multilevel  anterior bridging osteophytosis most prominent at the T7-L1 levels.  Cord:  Normal signal and morphology.  Paraspinal and other soft tissues: Negative.  Disc levels:  Multilevel desiccation however disc spaces are grossly preserved. Multilevel facet hypertrophy.  Small central C7-T1 protrusion partially effacing the ventral CSF containing spaces.  Mild disc bulge and ligamentum flavum thickening with minimal to mild spinal canal narrowing at the T4-5 level.  Minimal disc bulge at the T10-11 and T11-12 levels partially effacing the ventral CSF containing spaces.  Spinal canal and neural foramina are otherwise patent.  IMPRESSION: Mild multilevel spondylosis. Minimal to mild spinal canal  narrowing at the T4-5 level. Patent neural foramen.  Chronic T11 anterior wedging deformity with 10% height loss.  Electronically Signed   By: Primitivo Gauze M.D.   On: 04/19/2020 09:28 CT LUMBAR SPINE WO CONTRAST CLINICAL DATA:  Bilateral upper and lower extremity weakness. Prior lumbar surgery.  EXAM: CT LUMBAR SPINE WITHOUT CONTRAST  TECHNIQUE: Multidetector CT imaging of the lumbar spine was performed without intravenous contrast administration. Multiplanar CT image reconstructions were also generated.  COMPARISON:  Lumbar spine MRI 12/23/2019  FINDINGS: Segmentation: Transitional lumbosacral anatomy with the transitional segment described as a partially sacralized L5 for consistency with the previous MRI report. There is a nearly fully formed L5-S1 disc, and there are no ribs at T12.  Alignment: Unchanged trace retrolisthesis of L1 on L2.  Vertebrae: No acute fracture or suspicious osseous lesion. Prior lumbosacral fusion with pedicle screws in place bilaterally from L2-S1 except for a unilateral left-sided screw at L4. Old right-sided pedicle screw track at L4. Bilateral iliac screws traversing the SI joints. No evidence of screw loosening. Interbody implants with  solid fusion at L2-3, L3-4, and L4-5. Solid posterolateral osseous fusion on the left from L3-S1 and on the right at L2-3 and L4-5.  Paraspinal and other soft tissues: Abdominal aortic atherosclerosis without aneurysm.  Disc levels:  T12-L1: Spondylosis with primarily anterior vertebral spurring. No stenosis.  L1-2: Advanced disc degeneration with severe disc space narrowing, vacuum disc, and prominent degenerative endplate sclerosis. Retrolisthesis with bulging uncovered disc, endplate spurring, and facet and ligamentum flavum hypertrophy result in mild-to-moderate spinal stenosis and mild bilateral neural foraminal stenosis, likely not significantly changed from the prior MRI although the small central pseudo disc extrusion on MRI is not well demonstrated on CT.  L2-3: Previous fusion.  No significant stenosis.  L3-4: Previous posterior decompression infusion.  No stenosis.  L4-5: Previous posterior decompression and fusion.  No stenosis.  L5-S1: Previous fusion.  No stenosis.  IMPRESSION: 1. Solid L2-S1 fusion without residual stenosis. 2. L1-2 adjacent segment disease with mild-to-moderate spinal stenosis. 3. Aortic Atherosclerosis (ICD10-I70.0).  Electronically Signed   By: Logan Bores M.D.   On: 04/19/2020 08:53 Note: Reviewed        Physical Exam  General appearance: Well nourished, well developed, and well hydrated. In no apparent acute distress Mental status: Alert, oriented x 3 (person, place, & time)       Respiratory: No evidence of acute respiratory distress Eyes: PERLA Vitals: BP 132/90   Pulse 76   Temp (!) 97.3 F (36.3 C)   Ht '5\' 7"'  (1.702 m)   Wt 176 lb (79.8 kg)   SpO2 95%   BMI 27.57 kg/m  BMI: Estimated body mass index is 27.57 kg/m as calculated from the following:   Height as of this encounter: '5\' 7"'  (1.702 m).   Weight as of this encounter: 176 lb (79.8 kg). Ideal: Ideal body weight: 66.1 kg (145 lb 11.6 oz) Adjusted ideal body  weight: 71.6 kg (157 lb 13.3 oz)  Assessment   Status Diagnosis  Controlled Controlled Controlled 1. Chronic pain syndrome   2. Chronic low back pain (1ry area of Pain) (Bilateral) (L>R) w/o sciatica   3. Chronic shoulder pain (2ry area of Pain) (Bilateral) (L>R)   4. Chronic foot pain (3ry area of Pain) (Bilateral) (L>R)   5. Chronic hand pain (4th area of Pain) (Bilateral) (R>L)   6. Pharmacologic therapy   7. Uncomplicated opioid dependence ([place])      Updated Problems: No problems updated.  Plan of Care  Problem-specific:  No problem-specific Assessment & Plan notes found for this encounter.  Mr. COBEN GODSHALL has a current medication list which includes the following long-term medication(s): albuterol, atorvastatin, chlorthalidone, melatonin, morphine, [START ON 05/31/2020] oxycodone-acetaminophen, [START ON 06/30/2020] oxycodone-acetaminophen, and [START ON 07/30/2020] oxycodone-acetaminophen.  Pharmacotherapy (Medications Ordered): Meds ordered this encounter  Medications  . oxyCODONE-acetaminophen (PERCOCET) 10-325 MG tablet    Sig: Take 1 tablet by mouth every 8 (eight) hours as needed for pain. Must last 30 days.    Dispense:  90 tablet    Refill:  0    Chronic Pain: STOP Act (Not applicable) Fill 1 day early if closed on refill date. Avoid benzodiazepines within 8 hours of opioids  . oxyCODONE-acetaminophen (PERCOCET) 10-325 MG tablet    Sig: Take 1 tablet by mouth every 8 (eight) hours as needed for pain. Must last 30 days.    Dispense:  90 tablet    Refill:  0    Chronic Pain: STOP Act (Not applicable) Fill 1 day early if closed on refill date. Avoid benzodiazepines within 8 hours of opioids  . oxyCODONE-acetaminophen (PERCOCET) 10-325 MG tablet    Sig: Take 1 tablet by mouth every 8 (eight) hours as needed for pain. Must last 30 days.    Dispense:  90 tablet    Refill:  0    Chronic Pain: STOP Act (Not applicable) Fill 1 day early if closed on refill date. Avoid  benzodiazepines within 8 hours of opioids   Orders:  No orders of the defined types were placed in this encounter.  Follow-up plan:   Return in about 13 weeks (around 08/29/2020) for (F2F), (Med Mgmt).      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   Considering:   Diagnostic bilateral cervical facet block Possible bilateral cervical facet RFA  Diagnostic right greater occipital NB Possible right GON RFA  Possible right GON peripheral nerve stimulator trial  Diagnostic right CESI  Diagnostic bilateral IA shoulder injection Diagnostic bilateral suprascapular NB Possible bilateral suprascapular nerve RFA  Diagnostic bilateral SI joint block Possible bilateral SI joint RFA  Diagnostic bilateral lumbar facet block Possible bilateral lumbar facet RFA  Diagnostic left L4 TFESI  Diagnostic left L4-5 LESI  Diagnostic left IA hip joint injection Possible left hip joint RFA  Diagnostic bilateral median nerve block(carpal tunnel block)  Diagnostic right CPB   Palliative PRN treatment(s):   Palliative/Diagnostic left SI Block (Area of L5 Transverse process spatulation) #2  Palliative IM Toradol/Norflex 60/60 (PRN)      Recent Visits Date Type Provider Dept  03/16/20 Office Visit Milinda Pointer, MD [facility]  Showing recent visits within past 90 days and meeting all other requirements Today's Visits Date Type Provider Dept  05/30/20 Office Visit Milinda Pointer, MD [facility]  Showing today's visits and meeting all other requirements Future Appointments No visits were found meeting these conditions. Showing future appointments within next 90 days and meeting all other requirements  I discussed the assessment and treatment plan with the patient. The patient was  provided an opportunity to ask questions and all were answered. The patient agreed with the plan and demonstrated an understanding of the instructions.  Patient advised to call back or seek an in-person evaluation if the symptoms or condition worsens.  Duration of encounter: 32 minutes.  Note by: Kathlen Brunswick  Dossie Arbour, MD Date: 05/30/2020; Time: 9:38 AM

## 2020-05-30 ENCOUNTER — Other Ambulatory Visit: Payer: Self-pay

## 2020-05-30 ENCOUNTER — Encounter: Payer: Self-pay | Admitting: Pain Medicine

## 2020-05-30 ENCOUNTER — Ambulatory Visit: Payer: Medicare Other | Attending: Pain Medicine | Admitting: Pain Medicine

## 2020-05-30 VITALS — BP 132/90 | HR 76 | Temp 97.3°F | Ht 67.0 in | Wt 176.0 lb

## 2020-05-30 DIAGNOSIS — M545 Low back pain, unspecified: Secondary | ICD-10-CM | POA: Diagnosis not present

## 2020-05-30 DIAGNOSIS — M79642 Pain in left hand: Secondary | ICD-10-CM | POA: Diagnosis present

## 2020-05-30 DIAGNOSIS — G8929 Other chronic pain: Secondary | ICD-10-CM | POA: Insufficient documentation

## 2020-05-30 DIAGNOSIS — M25512 Pain in left shoulder: Secondary | ICD-10-CM | POA: Insufficient documentation

## 2020-05-30 DIAGNOSIS — F1120 Opioid dependence, uncomplicated: Secondary | ICD-10-CM

## 2020-05-30 DIAGNOSIS — Z79899 Other long term (current) drug therapy: Secondary | ICD-10-CM | POA: Diagnosis present

## 2020-05-30 DIAGNOSIS — M25511 Pain in right shoulder: Secondary | ICD-10-CM | POA: Insufficient documentation

## 2020-05-30 DIAGNOSIS — G5793 Unspecified mononeuropathy of bilateral lower limbs: Secondary | ICD-10-CM | POA: Insufficient documentation

## 2020-05-30 DIAGNOSIS — M79641 Pain in right hand: Secondary | ICD-10-CM | POA: Insufficient documentation

## 2020-05-30 DIAGNOSIS — G894 Chronic pain syndrome: Secondary | ICD-10-CM | POA: Diagnosis present

## 2020-05-30 MED ORDER — OXYCODONE-ACETAMINOPHEN 10-325 MG PO TABS
1.0000 | ORAL_TABLET | Freq: Three times a day (TID) | ORAL | 0 refills | Status: DC | PRN
Start: 2020-06-30 — End: 2020-08-29

## 2020-05-30 MED ORDER — OXYCODONE-ACETAMINOPHEN 10-325 MG PO TABS
1.0000 | ORAL_TABLET | Freq: Three times a day (TID) | ORAL | 0 refills | Status: DC | PRN
Start: 2020-07-30 — End: 2020-08-29

## 2020-05-30 MED ORDER — OXYCODONE-ACETAMINOPHEN 10-325 MG PO TABS: 1.0000 | ORAL_TABLET | Freq: Three times a day (TID) | ORAL | 0 refills | Status: DC | PRN

## 2020-05-30 NOTE — Progress Notes (Signed)
Nursing Pain Medication Assessment:  Safety precautions to be maintained throughout the outpatient stay will include: orient to surroundings, keep bed in low position, maintain call bell within reach at all times, provide assistance with transfer out of bed and ambulation.  Medication Inspection Compliance: Pill count conducted under aseptic conditions, in front of the patient. Neither the pills nor the bottle was removed from the patient's sight at any time. Once count was completed pills were immediately returned to the patient in their original bottle.  Medication: Morphine ER (MSContin) Pill/Patch Count: 0 of 90 pills remain Pill/Patch Appearance: Markings consistent with prescribed medication Bottle Appearance: Standard pharmacy container. Clearly labeled. Filled Date: 11 /24 / 2021 Last Medication intake:  Ran out of medicine more than 48 hours ago

## 2020-05-30 NOTE — Patient Instructions (Signed)
____________________________________________________________________________________________  Medication Rules  Purpose: To inform patients, and their family members, of our rules and regulations.  Applies to: All patients receiving prescriptions (written or electronic).  Pharmacy of record: Pharmacy where electronic prescriptions will be sent. If written prescriptions are taken to a different pharmacy, please inform the nursing staff. The pharmacy listed in the electronic medical record should be the one where you would like electronic prescriptions to be sent.  Electronic prescriptions: In compliance with the [place] Strengthen Opioid Misuse Prevention (STOP) Act of 2017 (Session Law 2017-74/H243), effective June 11, 2018, all controlled substances must be electronically prescribed. Calling prescriptions to the pharmacy will cease to exist.  Prescription refills: Only during scheduled appointments. Applies to all prescriptions.  NOTE: The following applies primarily to controlled substances (Opioid* Pain Medications).   Type of encounter (visit): For patients receiving controlled substances, face-to-face visits are required. (Not an option or up to the patient.)  Patient's responsibilities: 1. Pain Pills: Bring all pain pills to every appointment (except for procedure appointments). 2. Pill Bottles: Bring pills in original pharmacy bottle. Always bring the newest bottle. Bring bottle, even if empty. 3. Medication refills: You are responsible for knowing and keeping track of what medications you take and those you need refilled. The day before your appointment: write a list of all prescriptions that need to be refilled. The day of the appointment: give the list to the admitting nurse. Prescriptions will be written only during appointments. No prescriptions will be written on procedure days. If you forget a medication: it will not be "Called in", "Faxed", or "electronically sent".  You will need to get another appointment to get these prescribed. No early refills. Do not call asking to have your prescription filled early. 4. Prescription Accuracy: You are responsible for carefully inspecting your prescriptions before leaving our office. Have the discharge nurse carefully go over each prescription with you, before taking them home. Make sure that your name is accurately spelled, that your address is correct. Check the name and dose of your medication to make sure it is accurate. Check the number of pills, and the written instructions to make sure they are clear and accurate. Make sure that you are given enough medication to last until your next medication refill appointment. 5. Taking Medication: Take medication as prescribed. When it comes to controlled substances, taking less pills or less frequently than prescribed is permitted and encouraged. Never take more pills than instructed. Never take medication more frequently than prescribed.  6. Inform other Doctors: Always inform, all of your healthcare providers, of all the medications you take. 7. Pain Medication from other Providers: You are not allowed to accept any additional pain medication from any other Doctor or Healthcare provider. There are two exceptions to this rule. (see below) In the event that you require additional pain medication, you are responsible for notifying us, as stated below. 8. Cough Medicine: Often these contain an opioid, such as codeine or hydrocodone. Never accept or take cough medicine containing these opioids if you are already taking an opioid* medication. The combination may cause respiratory failure and death. 9. Medication Agreement: You are responsible for carefully reading and following our Medication Agreement. This must be signed before receiving any prescriptions from our practice. Safely store a copy of your signed Agreement. Violations to the Agreement will result in no further prescriptions.  (Additional copies of our Medication Agreement are available upon request.) 10. Laws, Rules, & Regulations: All patients are expected to follow all   Federal and State Laws, Statutes, Rules, & Regulations. Ignorance of the Laws does not constitute a valid excuse.  11. Illegal drugs and Controlled Substances: The use of illegal substances (including, but not limited to marijuana and its derivatives) and/or the illegal use of any controlled substances is strictly prohibited. Violation of this rule may result in the immediate and permanent discontinuation of any and all prescriptions being written by our practice. The use of any illegal substances is prohibited. 12. Adopted CDC guidelines & recommendations: Target dosing levels will be at or below 60 MME/day. Use of benzodiazepines** is not recommended.  Exceptions: There are only two exceptions to the rule of not receiving pain medications from other Healthcare Providers. 1. Exception #1 (Emergencies): In the event of an emergency (i.e.: accident requiring emergency care), you are allowed to receive additional pain medication. However, you are responsible for: As soon as you are able, call our office [phone], at any time of the day or night, and leave a message stating your name, the date and nature of the emergency, and the name and dose of the medication prescribed. In the event that your call is answered by a member of our staff, make sure to document and save the date, time, and the name of the person that took your information.  2. Exception #2 (Planned Surgery): In the event that you are scheduled by another doctor or dentist to have any type of surgery or procedure, you are allowed (for a period no longer than 30 days), to receive additional pain medication, for the acute post-op pain. However, in this case, you are responsible for picking up a copy of our "Post-op Pain Management for Surgeons" handout, and giving it to your surgeon or dentist. This  document is available at our office, and does not require an appointment to obtain it. Simply go to our office during business hours (Monday-Thursday from 8:00 AM to 4:00 PM) (Friday 8:00 AM to 12:00 Noon) or if you have a scheduled appointment with us, prior to your surgery, and ask for it by name. In addition, you are responsible for: calling our office [phone], at any time of the day or night, and leaving a message stating your name, name of your surgeon, type of surgery, and date of procedure or surgery. Failure to comply with your responsibilities may result in termination of therapy involving the controlled substances.  *Opioid medications include: morphine, codeine, oxycodone, oxymorphone, hydrocodone, hydromorphone, meperidine, tramadol, tapentadol, buprenorphine, fentanyl, methadone. **Benzodiazepine medications include: diazepam (Valium), alprazolam (Xanax), clonazepam (Klonopine), lorazepam (Ativan), clorazepate (Tranxene), chlordiazepoxide (Librium), estazolam (Prosom), oxazepam (Serax), temazepam (Restoril), triazolam (Halcion) (Last updated: 05/09/2020) ____________________________________________________________________________________________   ____________________________________________________________________________________________  Medication Recommendations and Reminders  Applies to: All patients receiving prescriptions (written and/or electronic).  Medication Rules & Regulations: These rules and regulations exist for your safety and that of others. They are not flexible and neither are we. Dismissing or ignoring them will be considered "non-compliance" with medication therapy, resulting in complete and irreversible termination of such therapy. (See document titled "Medication Rules" for more details.) In all conscience, because of safety reasons, we cannot continue providing a therapy where the patient does not follow instructions.  Pharmacy of record:   Definition:  This is the pharmacy where your electronic prescriptions will be sent.   We do not endorse any particular pharmacy, however, we have experienced problems with [facility] not securing enough medication supply for the community.  We do not restrict you in your choice of pharmacy. However,   once we write for your prescriptions, we will NOT be re-sending more prescriptions to fix restricted supply problems created by your pharmacy, or your insurance.   The pharmacy listed in the electronic medical record should be the one where you want electronic prescriptions to be sent.  If you choose to change pharmacy, simply notify our nursing staff.  Recommendations:  Keep all of your pain medications in a safe place, under lock and key, even if you live alone. We will NOT replace lost, stolen, or damaged medication.  After you fill your prescription, take 1 week's worth of pills and put them away in a safe place. You should keep a separate, properly labeled bottle for this purpose. The remainder should be kept in the original bottle. Use this as your primary supply, until it runs out. Once it's gone, then you know that you have 1 week's worth of medicine, and it is time to come in for a prescription refill. If you do this correctly, it is unlikely that you will ever run out of medicine.  To make sure that the above recommendation works, it is very important that you make sure your medication refill appointments are scheduled at least 1 week before you run out of medicine. To do this in an effective manner, make sure that you do not leave the office without scheduling your next medication management appointment. Always ask the nursing staff to show you in your prescription , when your medication will be running out. Then arrange for the receptionist to get you a return appointment, at least 7 days before you run out of medicine. Do not wait until you have 1 or 2 pills left, to come in. This is very poor planning and  does not take into consideration that we may need to cancel appointments due to bad weather, sickness, or emergencies affecting our staff.  DO NOT ACCEPT A "Partial Fill": If for any reason your pharmacy does not have enough pills/tablets to completely fill or refill your prescription, do not allow for a "partial fill". The law allows the pharmacy to complete that prescription within 72 hours, without requiring a new prescription. If they do not fill the rest of your prescription within those 72 hours, you will need a separate prescription to fill the remaining amount, which we will NOT provide. If the reason for the partial fill is your insurance, you will need to talk to the pharmacist about payment alternatives for the remaining tablets, but again, DO NOT ACCEPT A PARTIAL FILL, unless you can trust your pharmacist to obtain the remainder of the pills within 72 hours.  Prescription refills and/or changes in medication(s):   Prescription refills, and/or changes in dose or medication, will be conducted only during scheduled medication management appointments. (Applies to both, written and electronic prescriptions.)  No refills on procedure days. No medication will be changed or started on procedure days. No changes, adjustments, and/or refills will be conducted on a procedure day. Doing so will interfere with the diagnostic portion of the procedure.  No phone refills. No medications will be "called into the pharmacy".  No Fax refills.  No weekend refills.  No Holliday refills.  No after hours refills.  Remember:  Business hours are:  Monday to Thursday 8:00 AM to 4:00 PM Provider's Schedule: Ernestina Joe, MD - Appointments are:  Medication management: Monday and Wednesday 8:00 AM to 4:00 PM Procedure day: Tuesday and Thursday 7:30 AM to 4:00 PM Bilal Lateef, MD - Appointments are:    Medication management: Tuesday and Thursday 8:00 AM to 4:00 PM Procedure day: Monday and Wednesday  7:30 AM to 4:00 PM (Last update: 12/30/2019) ____________________________________________________________________________________________   ____________________________________________________________________________________________  CBD (cannabidiol) WARNING  Applicable to: All individuals currently taking or considering taking CBD (cannabidiol) and, more important, all patients taking opioid analgesic controlled substances (pain medication). (Example: oxycodone; oxymorphone; hydrocodone; hydromorphone; morphine; methadone; tramadol; tapentadol; fentanyl; buprenorphine; butorphanol; dextromethorphan; meperidine; codeine; etc.)  Legal status: CBD remains a Schedule I drug prohibited for any use. CBD is illegal with one exception. In the [place], CBD has a limited Food and Drug Administration (FDA) approval for the treatment of two specific types of epilepsy disorders. Only one CBD product has been approved by the FDA for this purpose: "Epidiolex". FDA is aware that some companies are marketing products containing cannabis and cannabis-derived compounds in ways that violate the Federal Food, Drug and Cosmetic Act (FD&C Act) and that may put the health and safety of consumers at risk. The FDA, a Federal agency, has not enforced the CBD status since 2018.   Legality: Some manufacturers ship CBD products nationally, which is illegal. Often such products are sold online and are therefore available throughout the country. CBD is openly sold in head shops and health food stores in some states where such sales have not been explicitly legalized. Selling unapproved products with unsubstantiated therapeutic claims is not only a violation of the law, but also can put patients at risk, as these products have not been proven to be safe or effective. Federal illegality makes it difficult to conduct research on CBD.  Reference: "FDA Regulation of Cannabis and Cannabis-Derived Products, Including Cannabidiol  (CBD)" - https://www.fda.gov/news-events/public-health-focus/fda-regulation-cannabis-and-cannabis-derived-products-including-cannabidiol-cbd  Warning: CBD is not FDA approved and has not undergo the same manufacturing controls as prescription drugs.  This means that the purity and safety of available CBD may be questionable. Most of the time, despite manufacturer's claims, it is contaminated with THC (delta-9-tetrahydrocannabinol - the chemical in marijuana responsible for the "HIGH").  When this is the case, the THC contaminant will trigger a positive urine drug screen (UDS) test for Marijuana (carboxy-THC). Because a positive UDS for any illicit substance is a violation of our medication agreement, your opioid analgesics (pain medicine) may be permanently discontinued.  MORE ABOUT CBD  General Information: CBD  is a derivative of the Marijuana (cannabis sativa) plant discovered in 1940. It is one of the 113 identified substances found in Marijuana. It accounts for up to 40% of the plant's extract. As of 2018, preliminary clinical studies on CBD included research for the treatment of anxiety, movement disorders, and pain. CBD is available and consumed in multiple forms, including inhalation of smoke or vapor, as an aerosol spray, and by mouth. It may be supplied as an oil containing CBD, capsules, dried cannabis, or as a liquid solution. CBD is thought not to be as psychoactive as THC (delta-9-tetrahydrocannabinol - the chemical in marijuana responsible for the "HIGH"). Studies suggest that CBD may interact with different biological target receptors in the body, including cannabinoid and other neurotransmitter receptors. As of 2018 the mechanism of action for its biological effects has not been determined.  Side-effects  Adverse reactions: Dry mouth, diarrhea, decreased appetite, fatigue, drowsiness, malaise, weakness, sleep disturbances, and others.  Drug interactions: CBC may interact with other  medications such as blood-thinners. (Last update: 01/16/2020) ____________________________________________________________________________________________    

## 2020-07-07 ENCOUNTER — Other Ambulatory Visit: Payer: Medicare Other

## 2020-07-07 DIAGNOSIS — Z20822 Contact with and (suspected) exposure to covid-19: Secondary | ICD-10-CM

## 2020-07-08 LAB — SARS-COV-2, NAA 2 DAY TAT

## 2020-07-08 LAB — NOVEL CORONAVIRUS, NAA: SARS-CoV-2, NAA: NOT DETECTED

## 2020-07-09 ENCOUNTER — Telehealth: Payer: Self-pay

## 2020-07-09 NOTE — Telephone Encounter (Signed)
Called and informed patient that test for Covid 19 was NEGATIVE. Discussed signs and symptoms of Covid 19 : fever, chills, respiratory symptoms, cough, ENT symptoms, sore throat, SOB, muscle pain, diarrhea, headache, loss of taste/smell, close exposure to COVID-19 patient. Pt instructed to call PCP if they develop the above signs and sx. Pt also instructed to call 911 if having respiratory issues/distress.  Pt verbalized understanding. Spoke with Orson Eva.

## 2020-07-29 ENCOUNTER — Emergency Department: Payer: Medicare (Managed Care)

## 2020-07-29 ENCOUNTER — Encounter: Payer: Self-pay | Admitting: Emergency Medicine

## 2020-07-29 ENCOUNTER — Emergency Department
Admission: EM | Admit: 2020-07-29 | Discharge: 2020-07-29 | Disposition: A | Discharge status: Home / Self Care | Payer: Medicare (Managed Care) | Attending: Emergency Medicine | Admitting: Emergency Medicine

## 2020-07-29 ENCOUNTER — Other Ambulatory Visit: Payer: Self-pay

## 2020-07-29 DIAGNOSIS — M79602 Pain in left arm: Secondary | ICD-10-CM | POA: Insufficient documentation

## 2020-07-29 DIAGNOSIS — R072 Precordial pain: Secondary | ICD-10-CM | POA: Insufficient documentation

## 2020-07-29 DIAGNOSIS — Z5321 Procedure and treatment not carried out due to patient leaving prior to being seen by health care provider: Secondary | ICD-10-CM | POA: Insufficient documentation

## 2020-07-29 LAB — BASIC METABOLIC PANEL
Anion gap: 10 (ref 5–15)
BUN: 20 mg/dL (ref 8–23)
CO2: 27 mmol/L (ref 22–32)
Calcium: 9.4 mg/dL (ref 8.9–10.3)
Chloride: 99 mmol/L (ref 98–111)
Creatinine, Ser: 0.96 mg/dL (ref 0.61–1.24)
GFR, Estimated: >60 mL/min (ref >60)
Glucose, Bld: 110 mg/dL — ABNORMAL HIGH (ref 70–99)
Potassium: 3.5 mmol/L (ref 3.5–5.1)
Sodium: 136 mmol/L (ref 135–145)

## 2020-07-29 LAB — CBC
HCT: 44.2 % (ref 39.0–52.0)
Hemoglobin: 15.2 g/dL (ref 13.0–17.0)
MCH: 28.9 pg (ref 26.0–34.0)
MCHC: 34.4 g/dL (ref 30.0–36.0)
MCV: 84 fL (ref 80.0–100.0)
Platelets: 219 10*3/uL (ref 150–400)
RBC: 5.26 MIL/uL (ref 4.22–5.81)
RDW: 15.1 % (ref 11.5–15.5)
WBC: 8.5 10*3/uL (ref 4.0–10.5)
nRBC: 0 % (ref 0.0–0.2)

## 2020-07-29 LAB — TROPONIN I (HIGH SENSITIVITY): Troponin I (High Sensitivity): 3 ng/L (ref <18)

## 2020-07-29 NOTE — ED Notes (Signed)
Pt called x3 for troponin 2nd draw and to update vitals.  No response by patient and patient unable to be found in the lobby.  Pt did not inform RN that he was LWBS.

## 2020-07-29 NOTE — ED Triage Notes (Signed)
Pt to ED via POV with c/o substernal CP that radiates to his L arm since yesterday. Pt states feels like someone is sitting on his chest at this time.

## 2020-07-29 NOTE — ED Notes (Signed)
Pt called x2 for repeat trop and updated vitals.  No response by patient and patient unable to be found in the lobby at this time.

## 2020-07-29 NOTE — ED Notes (Signed)
Pt called x1 for 2nd troponin redraw as well as Vitals to be updated.  No response by patient, and unable to find patient in lobby or outside.

## 2020-08-01 ENCOUNTER — Telehealth: Payer: Self-pay | Admitting: Pain Medicine

## 2020-08-01 NOTE — Telephone Encounter (Signed)
Patient needs prior auth for pain medication. Please notify Kieth Brightly (fiance) when this is completed.

## 2020-08-02 NOTE — Telephone Encounter (Signed)
Called Mr Howard Murphy to make him aware that his medication has been approved. Patient with understanding.

## 2020-08-17 ENCOUNTER — Other Ambulatory Visit: Payer: Self-pay | Admitting: Neurosurgery

## 2020-08-28 NOTE — Progress Notes (Signed)
PROVIDER NOTE: Information contained herein reflects review and annotations entered in association with encounter. Interpretation of such information and data should be left to medically-trained personnel. Information provided to patient can be located elsewhere in the medical record under "Patient Instructions". Document created using STT-dictation technology, any transcriptional errors that may result from process are unintentional.    Patient: Howard Murphy  Service Category: E/M  Provider: Gaspar Cola, MD  DOB: January 14, 1956  DOS: 08/29/2020  Specialty: Interventional Pain Management  MRN: [id number]  Setting: Ambulatory outpatient  PCP: [facility] [place]  Type: Established Patient    Referring Provider: [facility]  Location: Office  Delivery: Face-to-face     HPI  [name], a 65 y.o. year old male, is here today because of his Chronic pain syndrome [G89.4]. [name] primary complain today is Back Pain (low) Last encounter: My last encounter with him was on 08/01/2020. Pertinent problems: [name] has History of lumbar facet Synovial cyst, surgically removed.; Chronic abdominal pain (RUQ); Type 2 diabetes mellitus with peripheral neuropathy ([place]); Spondylosis of lumbar spine; Lumbar facet joint syndrome ([place]); Chronic hip pain (Left); Diabetic peripheral neuropathy ([place]); Chronic lumbar radicular pain (S1 Dermatome) (Bilateral) (L>R); Complete tear of the shoulder rotator cuff (Left); Chronic shoulder pain (2ry area of Pain) (Bilateral) (L>R); Chronic foot pain (3ry area of Pain) (Bilateral) (L>R); Chronic hand pain (4th area of Pain) (Bilateral) (R>L); Lumbar facet hypertrophy; Occipital pain (Right); Failed back surgical syndrome 4; Chronic low back pain (1ry area of Pain) (Bilateral) (L>R) w/o sciatica; Radicular pain of shoulder (Right); Carpal tunnel syndrome (Bilateral); Chronic cervical radicular pain (Right); Abnormal MRI, lumbar spine  (12/23/2019); Chronic sacroiliac joint pain (Bilateral) (L>R); Neurogenic pain; Cervical spondylosis; Cervical facet arthropathy; Chronic neck pain (Bilateral) (R>L); Cervical facet syndrome (Bilateral) (R>L); Chronic upper extremity pain (Bilateral) (R>L); Cervical foraminal stenosis;  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, C5-6); Arthropathy of shoulder (Right); 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); Lumbar foraminal stenosis  (Left: L4-5) (Right: L1-2); Grade 1 Anterolisthesis (2 mm) of L4 over L5.; Lumbar facet (L4-5) synovial cyst (6 mm) (Left); Chronic pain syndrome; Spondylolisthesis at L3-L4 level; Complete tear of left rotator cuff; Bertolotti's syndrome (L5-S1) (Bilateral); 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); Other specified dorsopathies, sacral and sacrococcygeal region; Spondylosis without myelopathy or radiculopathy, lumbosacral region; Chronic bilateral low back pain with bilateral sciatica; Neurogenic claudication ([place]); Osteoarthritis involving multiple joints; Chronic musculoskeletal pain; Acute exacerbation of chronic low back pain; Grade 1 Retrolisthesis of L1/L2; Lumbar disc extrusion with 7 mm caudal migration (L1-2); Lumbosacral radiculopathy at L1 (Right); Groin pain (Right); 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 Pain Assessment: Severity of Chronic pain is reported as a 8 /10. Location: Back Lower/right grion. Onset: More than a month ago. Quality: Aching,Constant. Timing: Constant. Modifying factor(s): medications. Vitals:  height is '5\' 7"'  (1.702 m) and weight is 180 lb (81.6 kg). His temporal temperature is 97.2 F (36.2 C) (abnormal). His blood pressure is 118/71 and his pulse is 80. His respiration is 16 and oxygen saturation is 96%.   Reason for encounter: medication management.   The patient indicates doing well with the  current medication regimen. No adverse reactions or side effects reported to the medications.  Today the patient comes in indicating that he is pending to have back surgery by Dr. Cari Caraway around September 14, 2020, as soon as his hemoglobin A1c  decreases.  Today he comes in indicating that he would like to increase his pain medication to every 6 hours until he has the surgery.  Today we had a long talk about this and I have agreed to do that, as long as he understands that after his back surgery, then we will be lowering his oxycodone and we will no longer be prescribing 10 mg every 8 hours.  He understood and agreed.  RTCB:  11/27/2020 Nonopioids transferred 03/16/2021: Melatonin  Pharmacotherapy Assessment   Analgesic: Oxycodone IR 10 mg 1 tab PO q 8 hrs (30 mg/day of oxycodone) MME/day:61m/day.   Monitoring: [place] PMP: PDMP reviewed during this encounter.       Pharmacotherapy: No side-effects or adverse reactions reported. Compliance: No problems identified. Effectiveness: Clinically acceptable.  SHart Rochester RN  08/29/2020 10:24 AM  Sign when Signing Visit Nursing Pain Medication Assessment:  Safety precautions to be maintained throughout the outpatient stay will include: orient to surroundings, keep bed in low position, maintain call bell within reach at all times, provide assistance with transfer out of bed and ambulation.  Medication Inspection Compliance: bottle was empty  Medication: Oxycodone IR Pill/Patch Count: 0 of 90 pills remain Pill/Patch Appearance: bottle empty Bottle Appearance: Standard pharmacy container. Clearly labeled. Filled Date: 02 / 22 / 2022 Last Medication intake:  Today    UDS:  Summary  Date Value Ref Range Status  12/02/2019 Note  Final    Comment:    ==================================================================== [employer] 13 (MW) ==================================================================== Test                              Result       Flag       Units  Drug Present and Declared for Prescription Verification   Oxycodone                      6169         EXPECTED   ng/mg creat   Oxymorphone                    2086         EXPECTED   ng/mg creat   Noroxycodone                   4079         EXPECTED   ng/mg creat   Noroxymorphone                 645          EXPECTED   ng/mg creat    Sources of oxycodone are scheduled prescription medications.    Oxymorphone, noroxycodone, and noroxymorphone are expected    metabolites of oxycodone. Oxymorphone is also available as a    scheduled prescription medication.  ==================================================================== Test                      Result    Flag   Units      Ref Range   Creatinine              141              mg/dL      >=20 ==================================================================== Declared Medications:  The flagging and interpretation on this report are based on the  following declared medications.  Unexpected results may arise from  inaccuracies in the declared medications.   **  Note: The testing scope of this panel includes these medications:   Oxycodone   **Note: The testing scope of this panel does not include the  following reported medications:   Albuterol (Ventolin HFA)  Atorvastatin (Lipitor)  Canagliflozin (Invokana)  Chlorthalidone (Hygroton)  Lisinopril (Zestril)  Meclizine (Antivert)  Melatonin  Ondansetron (Zofran)  Potassium (Klor-Con)  Tamsulosin (Flomax)  Testosterone  Vitamin D ==================================================================== For clinical consultation, please call [phone]. ====================================================================      ROS  Constitutional: Denies any fever or chills Gastrointestinal: No reported hemesis, hematochezia, vomiting, or acute GI distress Musculoskeletal: Denies any acute onset joint swelling, redness, loss of ROM, or  weakness Neurological: No reported episodes of acute onset apraxia, aphasia, dysarthria, agnosia, amnesia, paralysis, loss of coordination, or loss of consciousness  Medication Review  atorvastatin, chlorthalidone, empagliflozin, ibuprofen, lisinopril, meclizine, oxyCODONE-acetaminophen, sitaGLIPtin, tamsulosin, testosterone cypionate, and zolpidem  History Review  Allergy: [name] is allergic to adhesive [tape]. Drug: [name]  reports no history of drug use. Alcohol:  reports no history of alcohol use. Tobacco:  reports that he has never smoked. He quit smokeless tobacco use about 7 years ago.  His smokeless tobacco use included chew. Social: [name]  reports that he has never smoked. He quit smokeless tobacco use about 7 years ago.  His smokeless tobacco use included chew. He reports that he does not drink alcohol and does not use drugs. Medical:  has a past medical history of Arthritis, Back ache (05/05/2013), Diabetes mellitus without complication ([place]), GERD (gastroesophageal reflux disease), Headache, History of kidney stones, Hyperlipemia, Hypertension, Kidney stones, Kidney stones, Nephrolithiasis, and Shortness of breath dyspnea. Surgical: [name]  has a past surgical history that includes Cholecystectomy (2000); Shoulder surgery (Right, 2016); Colonoscopy with propofol (N/A, 04/11/2015); Esophagogastroduodenoscopy (egd) with propofol (N/A, 04/11/2015); Shoulder surgery (Left, 06/2015); Back surgery (2015); Back surgery (08/09/2016); Cystoscopy with ureteroscopy and stent placement (Right, 08/27/2017); laparoscopic appendectomy (N/A, 12/18/2017); Appendectomy; and Back surgery (Bilateral, 2019). Family: family history includes Cancer in his maternal grandmother, mother, sister, and sister; Heart disease in his father; Stroke in his father.  Laboratory Chemistry Profile   Renal Lab Results  Component Value Date   BUN 20 07/29/2020   CREATININE 0.96 07/29/2020   LABCREA  182 02/24/2016   BCR 14 03/07/2017   GFRAA >60 12/22/2019   GFRNONAA >60 07/29/2020     Hepatic Lab Results  Component Value Date   AST 29 12/22/2019   ALT 36 12/22/2019   ALBUMIN 4.3 12/22/2019   ALKPHOS 68 12/22/2019   LIPASE 46 12/03/2018     Electrolytes Lab Results  Component Value Date   NA 136 07/29/2020   K 3.5 07/29/2020   CL 99 07/29/2020   CALCIUM 9.4 07/29/2020   MG 2.0 11/29/2016     Bone Lab Results  Component Value Date   25OHVITD1 24 (L) 11/29/2016   25OHVITD2 <1.0 11/29/2016   25OHVITD3 24 11/29/2016     Inflammation (CRP: Acute Phase) (ESR: Chronic Phase) Lab Results  Component Value Date   CRP 1.8 11/29/2016   ESRSEDRATE 11 11/29/2016       Note: Above Lab results reviewed.  Recent Imaging Review  DG Chest 2 View CLINICAL DATA:  Chest pain.  Shortness of breath.  EXAM: CHEST - 2 VIEW  COMPARISON:  February 15, 2019.  FINDINGS: The heart size and mediastinal contours are within normal limits. Both lungs are clear. No pneumothorax or pleural effusion is noted. Stable elevated right hemidiaphragm. The visualized skeletal structures  are unremarkable.  IMPRESSION: No active cardiopulmonary disease.  Electronically Signed   By: Marijo Conception M.D.   On: 07/29/2020 16:41 Note: Reviewed        Physical Exam  General appearance: Well nourished, well developed, and well hydrated. In no apparent acute distress Mental status: Alert, oriented x 3 (person, place, & time)       Respiratory: No evidence of acute respiratory distress Eyes: PERLA Vitals: BP 118/71 (BP Location: Right Arm, Patient Position: Sitting, Cuff Size: Normal)   Pulse 80   Temp (!) 97.2 F (36.2 C) (Temporal)   Resp 16   Ht '5\' 7"'  (1.702 m)   Wt 180 lb (81.6 kg)   SpO2 96%   BMI 28.19 kg/m  BMI: Estimated body mass index is 28.19 kg/m as calculated from the following:   Height as of this encounter: '5\' 7"'  (1.702 m).   Weight as of this encounter: 180 lb (81.6  kg). Ideal: Ideal body weight: 66.1 kg (145 lb 11.6 oz) Adjusted ideal body weight: 72.3 kg (159 lb 6.9 oz)  Assessment   Status Diagnosis  Controlled Controlled Controlled 1. Chronic pain syndrome   2. Chronic low back pain (1ry area of Pain) (Bilateral) (L>R) w/o sciatica   3. Chronic shoulder pain (2ry area of Pain) (Bilateral) (L>R)   4. Chronic foot pain (3ry area of Pain) (Bilateral) (L>R)   5. Chronic hand pain (4th area of Pain) (Bilateral) (R>L)   6. Pharmacologic therapy   7. Long term prescription opiate use   8. Uncomplicated opioid dependence ([place])      Updated Problems: No problems updated.  Plan of Care  Problem-specific:  No problem-specific Assessment & Plan notes found for this encounter.  [name] has a current medication list which includes the following long-term medication(s): atorvastatin, chlorthalidone, oxycodone-acetaminophen, [START ON 09/28/2020] oxycodone-acetaminophen, [START ON 10/28/2020] oxycodone-acetaminophen, and zolpidem.  Pharmacotherapy (Medications Ordered): Meds ordered this encounter  Medications  . oxyCODONE-acetaminophen (PERCOCET) 10-325 MG tablet    Sig: Take 1 tablet by mouth every 6 (six) hours as needed for pain. Must last 30 days.    Dispense:  120 tablet    Refill:  0    Not a duplicate. Do NOT delete! Chronic Pain: STOP Act NOT applicable. Fill 1 day early if closed on refill date. Avoid benzodiazepines within 8 hours of opioids. Do not send refill requests.  Marland Kitchen oxyCODONE-acetaminophen (PERCOCET) 10-325 MG tablet    Sig: Take 1 tablet by mouth every 8 (eight) hours as needed for pain. Must last 30 days.    Dispense:  90 tablet    Refill:  0    Not a duplicate. Do NOT delete! Chronic Pain: STOP Act NOT applicable. Fill 1 day early if closed on refill date. Avoid benzodiazepines within 8 hours of opioids. Do not send refill requests.  Marland Kitchen oxyCODONE-acetaminophen (PERCOCET) 10-325 MG tablet    Sig: Take 1 tablet by mouth  every 8 (eight) hours as needed for pain. Must last 30 days.    Dispense:  90 tablet    Refill:  0    Not a duplicate. Do NOT delete! Chronic Pain: STOP Act NOT applicable. Fill 1 day early if closed on refill date. Avoid benzodiazepines within 8 hours of opioids. Do not send refill requests.   Orders:  No orders of the defined types were placed in this encounter.  Follow-up plan:   Return in about 3 months (around 11/27/2020) for (F2F), (MM).  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   Considering:   Diagnostic bilateral cervical facet block Possible bilateral cervical facet RFA  Diagnostic right greater occipital NB Possible right GON RFA  Possible right GON peripheral nerve stimulator trial  Diagnostic right CESI  Diagnostic bilateral IA shoulder injection Diagnostic bilateral suprascapular NB Possible bilateral suprascapular nerve RFA  Diagnostic bilateral SI joint block Possible bilateral SI joint RFA  Diagnostic bilateral lumbar facet block Possible bilateral lumbar facet RFA  Diagnostic left L4 TFESI  Diagnostic left L4-5 LESI  Diagnostic left IA hip joint injection Possible left hip joint RFA  Diagnostic bilateral median nerve block(carpal tunnel block)  Diagnostic right CPB   Palliative PRN treatment(s):   Palliative/Diagnostic left SI Block (Area of L5 Transverse process spatulation) #2  Palliative IM Toradol/Norflex 60/60 (PRN)       Recent Visits No visits were found meeting these conditions. Showing recent visits within past 90 days and meeting all other requirements Today's Visits Date Type Provider Dept  08/29/20 Office Visit Milinda Pointer, MD [facility]  Showing today's visits and meeting all other requirements Future Appointments Date Type  Provider Dept  11/23/20 Appointment Milinda Pointer, MD [facility]  Showing future appointments within next 90 days and meeting all other requirements  I discussed the assessment and treatment plan with the patient. The patient was provided an opportunity to ask questions and all were answered. The patient agreed with the plan and demonstrated an understanding of the instructions.  Patient advised to call back or seek an in-person evaluation if the symptoms or condition worsens.  Duration of encounter: 30 minutes.  Note by: Gaspar Cola, MD Date: 08/29/2020; Time: 10:26 AM

## 2020-08-29 ENCOUNTER — Ambulatory Visit: Payer: Medicare (Managed Care) | Attending: Pain Medicine | Admitting: Pain Medicine

## 2020-08-29 ENCOUNTER — Encounter: Payer: Self-pay | Admitting: Pain Medicine

## 2020-08-29 ENCOUNTER — Other Ambulatory Visit: Payer: Self-pay

## 2020-08-29 VITALS — BP 118/71 | HR 80 | Temp 97.2°F | Resp 16 | Ht 67.0 in | Wt 180.0 lb

## 2020-08-29 DIAGNOSIS — F1120 Opioid dependence, uncomplicated: Secondary | ICD-10-CM | POA: Insufficient documentation

## 2020-08-29 DIAGNOSIS — M25511 Pain in right shoulder: Secondary | ICD-10-CM | POA: Diagnosis present

## 2020-08-29 DIAGNOSIS — M25512 Pain in left shoulder: Secondary | ICD-10-CM | POA: Insufficient documentation

## 2020-08-29 DIAGNOSIS — G5793 Unspecified mononeuropathy of bilateral lower limbs: Secondary | ICD-10-CM | POA: Insufficient documentation

## 2020-08-29 DIAGNOSIS — M79642 Pain in left hand: Secondary | ICD-10-CM | POA: Diagnosis present

## 2020-08-29 DIAGNOSIS — M79641 Pain in right hand: Secondary | ICD-10-CM | POA: Insufficient documentation

## 2020-08-29 DIAGNOSIS — G8929 Other chronic pain: Secondary | ICD-10-CM | POA: Insufficient documentation

## 2020-08-29 DIAGNOSIS — M545 Low back pain, unspecified: Secondary | ICD-10-CM | POA: Diagnosis not present

## 2020-08-29 DIAGNOSIS — G894 Chronic pain syndrome: Secondary | ICD-10-CM | POA: Insufficient documentation

## 2020-08-29 DIAGNOSIS — Z79899 Other long term (current) drug therapy: Secondary | ICD-10-CM | POA: Insufficient documentation

## 2020-08-29 DIAGNOSIS — Z79891 Long term (current) use of opiate analgesic: Secondary | ICD-10-CM | POA: Insufficient documentation

## 2020-08-29 MED ORDER — OXYCODONE-ACETAMINOPHEN 10-325 MG PO TABS: 1.0000 | ORAL_TABLET | Freq: Three times a day (TID) | ORAL | 0 refills | Status: DC | PRN

## 2020-08-29 MED ORDER — OXYCODONE-ACETAMINOPHEN 10-325 MG PO TABS: 1.0000 | ORAL_TABLET | Freq: Four times a day (QID) | ORAL | 0 refills | Status: DC | PRN

## 2020-08-29 NOTE — Progress Notes (Signed)
Nursing Pain Medication Assessment:  Safety precautions to be maintained throughout the outpatient stay will include: orient to surroundings, keep bed in low position, maintain call bell within reach at all times, provide assistance with transfer out of bed and ambulation.  Medication Inspection Compliance: bottle was empty  Medication: Oxycodone IR Pill/Patch Count: 0 of 90 pills remain Pill/Patch Appearance: bottle empty Bottle Appearance: Standard pharmacy container. Clearly labeled. Filled Date: 02 / 22 / 2022 Last Medication intake:  Today

## 2020-09-02 ENCOUNTER — Encounter
Admission: RE | Admit: 2020-09-02 | Discharge: 2020-09-02 | Disposition: A | Discharge status: Home / Self Care | Payer: Medicare (Managed Care) | Source: Ambulatory Visit | Attending: Neurosurgery | Admitting: Neurosurgery

## 2020-09-02 ENCOUNTER — Other Ambulatory Visit: Payer: Self-pay

## 2020-09-02 DIAGNOSIS — Z01812 Encounter for preprocedural laboratory examination: Secondary | ICD-10-CM | POA: Insufficient documentation

## 2020-09-02 LAB — SURGICAL PCR SCREEN
MRSA, PCR: NEGATIVE
Staphylococcus aureus: NEGATIVE

## 2020-09-02 LAB — URINALYSIS, ROUTINE W REFLEX MICROSCOPIC
Bacteria, UA: NONE SEEN
Bilirubin Urine: NEGATIVE
Glucose, UA: >=500 mg/dL — AB
Hgb urine dipstick: NEGATIVE
Ketones, ur: NEGATIVE mg/dL
Leukocytes,Ua: NEGATIVE
Nitrite: NEGATIVE
Protein, ur: NEGATIVE mg/dL
Specific Gravity, Urine: 1.018 (ref 1.005–1.030)
Squamous Epithelial / HPF: NONE SEEN (ref 0–5)
pH: 7 (ref 5.0–8.0)

## 2020-09-02 LAB — CBC
HCT: 49.7 % (ref 39.0–52.0)
Hemoglobin: 17.1 g/dL — ABNORMAL HIGH (ref 13.0–17.0)
MCH: 30.6 pg (ref 26.0–34.0)
MCHC: 34.4 g/dL (ref 30.0–36.0)
MCV: 89.1 fL (ref 80.0–100.0)
Platelets: 223 10*3/uL (ref 150–400)
RBC: 5.58 MIL/uL (ref 4.22–5.81)
RDW: 15.2 % (ref 11.5–15.5)
WBC: 10.1 10*3/uL (ref 4.0–10.5)
nRBC: 0 % (ref 0.0–0.2)

## 2020-09-02 LAB — BASIC METABOLIC PANEL
Anion gap: 9 (ref 5–15)
BUN: 17 mg/dL (ref 8–23)
CO2: 26 mmol/L (ref 22–32)
Calcium: 9.4 mg/dL (ref 8.9–10.3)
Chloride: 100 mmol/L (ref 98–111)
Creatinine, Ser: 0.86 mg/dL (ref 0.61–1.24)
GFR, Estimated: >60 mL/min (ref >60)
Glucose, Bld: 120 mg/dL — ABNORMAL HIGH (ref 70–99)
Potassium: 3.4 mmol/L — ABNORMAL LOW (ref 3.5–5.1)
Sodium: 135 mmol/L (ref 135–145)

## 2020-09-02 LAB — APTT: aPTT: 28 seconds (ref 24–36)

## 2020-09-02 LAB — TYPE AND SCREEN
ABO/RH(D): A POS
Antibody Screen: NEGATIVE

## 2020-09-02 LAB — PROTIME-INR
INR: 1.1 (ref 0.8–1.2)
Prothrombin Time: 13.3 seconds (ref 11.4–15.2)

## 2020-09-02 NOTE — Patient Instructions (Signed)
Your procedure is scheduled on: Wednesday September 14, 2020 Report to the Registration Desk on the 1st floor of the [facility]. To find out your arrival time, please call [phone] between 1PM - 3PM on: Tuesday September 13, 2020  REMEMBER: Instructions that are not followed completely may result in serious medical risk, up to and including death; or upon the discretion of your surgeon and anesthesiologist your surgery may need to be rescheduled.  Do not eat food after midnight the night before surgery.  No gum chewing, lozengers or hard candies.  You may however, drink CLEAR liquids up to 2 hours before you are scheduled to arrive for your surgery. Do not drink anything within 2 hours of your scheduled arrival time.  Clear liquids include: - water   Type 1 and Type 2 diabetics should only drink water.  TAKE THESE MEDICATIONS THE MORNING OF SURGERY WITH A SIP OF WATER: PAIN PILL IF NEEDED TAMSULOSIN  One week prior to surgery: Stop Anti-inflammatories (NSAIDS) such as Advil, Aleve, Ibuprofen, Motrin, Naproxen, Naprosyn and ASPIRIN OR  Aspirin based products such as Excedrin, Goodys Powder, BC Powder.    USE TYLENOL IF NEEDED Stop ANY OVER THE COUNTER supplements  until after surgery.  No Alcohol for 24 hours before or after surgery.  No Smoking including e-cigarettes for 24 hours prior to surgery.  No chewable tobacco products for at least 6 hours prior to surgery.  No nicotine patches on the day of surgery.  Do not use any "recreational" drugs for at least a week prior to your surgery.  Please be advised that the combination of cocaine and anesthesia may have negative outcomes, up to and including death. If you test positive for cocaine, your surgery will be cancelled.  On the morning of surgery brush your teeth with toothpaste and water, you may rinse your mouth with mouthwash if you wish. Do not swallow any toothpaste or mouthwash.  Do not wear jewelry, make-up, hairpins, clips  or nail polish.  Do not wear lotions, powders, or perfumes OR DEODORANT    Do not shave body from the neck down 48 hours prior to surgery just in case you cut yourself which could leave a site for infection.  Also, freshly shaved skin may become irritated if using the CHG soap.  Contact lenses, hearing aids and dentures may not be worn into surgery.  Do not bring valuables to the hospital. [facility] is not responsible for any missing/lost belongings or valuables.   Use CHG Soap as directed on instruction sheet.  Notify your doctor if there is any change in your medical condition (cold, fever, infection).  Wear comfortable clothing (specific to your surgery type) to the hospital.  Plan for stool softeners for home use; pain medications have a tendency to cause constipation. You can also help prevent constipation by eating foods high in fiber such as fruits and vegetables and drinking plenty of fluids as your diet allows.  After surgery, you can help prevent lung complications by doing breathing exercises.  Take deep breaths and cough every 1-2 hours. Your doctor may order a device called an Incentive Spirometer to help you take deep breaths. When coughing or sneezing, hold a pillow firmly against your incision with both hands. This is called "splinting." Doing this helps protect your incision. It also decreases belly discomfort.  If you are being admitted to the hospital overnight, [place]  If you are being discharged the day  of surgery, you will not be allowed to drive home. You will need a responsible adult (18 years or older) to drive you home and stay with you that night.   Please call the [facility] at [phone] if you have any questions about these instructions.  Surgery Visitation Policy:  Patients undergoing a surgery or procedure may have one family member or support person with them as long as that person is not  COVID-19 positive or experiencing its symptoms.  That person may remain in the waiting area during the procedure.  Inpatient Visitation:    Visiting hours are 7 a.m. to 8 p.m. Inpatients will be allowed two visitors daily. The visitors may change each day during the patient's stay. No visitors under the age of 8. Any visitor under the age of 59 must be accompanied by an adult. The visitor must pass COVID-19 screenings, use hand sanitizer when entering and exiting the patient's room and wear a mask at all times, including in the patient's room. Patients must also wear a mask when staff or their visitor are in the room. Masking is required regardless of vaccination status.

## 2020-09-12 ENCOUNTER — Other Ambulatory Visit
Admission: RE | Admit: 2020-09-12 | Discharge: 2020-09-12 | Disposition: A | Discharge status: Home / Self Care | Payer: Medicare (Managed Care) | Source: Ambulatory Visit | Attending: Neurosurgery | Admitting: Neurosurgery

## 2020-09-12 ENCOUNTER — Other Ambulatory Visit: Payer: Self-pay

## 2020-09-12 DIAGNOSIS — Z20822 Contact with and (suspected) exposure to covid-19: Secondary | ICD-10-CM | POA: Insufficient documentation

## 2020-09-12 DIAGNOSIS — Z01812 Encounter for preprocedural laboratory examination: Secondary | ICD-10-CM | POA: Insufficient documentation

## 2020-09-12 LAB — SARS CORONAVIRUS 2 (TAT 6-24 HRS): SARS Coronavirus 2: NEGATIVE

## 2020-09-14 ENCOUNTER — Other Ambulatory Visit: Payer: Self-pay

## 2020-09-14 ENCOUNTER — Encounter: Payer: Self-pay | Admitting: Neurosurgery

## 2020-09-14 ENCOUNTER — Encounter
Admission: RE | Disposition: A | Discharge status: Home / Self Care | Payer: Self-pay | Source: Home / Self Care | Attending: Neurosurgery

## 2020-09-14 ENCOUNTER — Inpatient Hospital Stay
Admission: RE | Admit: 2020-09-14 | Discharge: 2020-09-17 | DRG: 460 | Disposition: A | Discharge status: Home / Self Care | Payer: Medicare (Managed Care) | Attending: Neurosurgery | Admitting: Neurosurgery

## 2020-09-14 ENCOUNTER — Inpatient Hospital Stay: Payer: Medicare (Managed Care) | Admitting: Anesthesiology

## 2020-09-14 ENCOUNTER — Inpatient Hospital Stay: Payer: Medicare (Managed Care)

## 2020-09-14 DIAGNOSIS — Z7984 Long term (current) use of oral hypoglycemic drugs: Secondary | ICD-10-CM

## 2020-09-14 DIAGNOSIS — E7800 Pure hypercholesterolemia, unspecified: Secondary | ICD-10-CM | POA: Diagnosis present

## 2020-09-14 DIAGNOSIS — Z20822 Contact with and (suspected) exposure to covid-19: Secondary | ICD-10-CM | POA: Diagnosis present

## 2020-09-14 DIAGNOSIS — G4700 Insomnia, unspecified: Secondary | ICD-10-CM | POA: Diagnosis present

## 2020-09-14 DIAGNOSIS — E119 Type 2 diabetes mellitus without complications: Secondary | ICD-10-CM | POA: Diagnosis present

## 2020-09-14 DIAGNOSIS — M5136 Other intervertebral disc degeneration, lumbar region: Secondary | ICD-10-CM

## 2020-09-14 DIAGNOSIS — Z91048 Other nonmedicinal substance allergy status: Secondary | ICD-10-CM

## 2020-09-14 DIAGNOSIS — G8929 Other chronic pain: Secondary | ICD-10-CM | POA: Diagnosis present

## 2020-09-14 DIAGNOSIS — M069 Rheumatoid arthritis, unspecified: Secondary | ICD-10-CM | POA: Diagnosis present

## 2020-09-14 DIAGNOSIS — D72829 Elevated white blood cell count, unspecified: Secondary | ICD-10-CM | POA: Diagnosis present

## 2020-09-14 DIAGNOSIS — I959 Hypotension, unspecified: Secondary | ICD-10-CM | POA: Diagnosis present

## 2020-09-14 DIAGNOSIS — M4326 Fusion of spine, lumbar region: Secondary | ICD-10-CM

## 2020-09-14 DIAGNOSIS — E876 Hypokalemia: Secondary | ICD-10-CM | POA: Diagnosis present

## 2020-09-14 DIAGNOSIS — I10 Essential (primary) hypertension: Secondary | ICD-10-CM | POA: Diagnosis present

## 2020-09-14 DIAGNOSIS — Z79899 Other long term (current) drug therapy: Secondary | ICD-10-CM | POA: Diagnosis not present

## 2020-09-14 DIAGNOSIS — M4316 Spondylolisthesis, lumbar region: Principal | ICD-10-CM

## 2020-09-14 DIAGNOSIS — N400 Enlarged prostate without lower urinary tract symptoms: Secondary | ICD-10-CM | POA: Diagnosis present

## 2020-09-14 DIAGNOSIS — Z419 Encounter for procedure for purposes other than remedying health state, unspecified: Secondary | ICD-10-CM

## 2020-09-14 HISTORY — PX: POSTERIOR LUMBAR FUSION 4 LEVEL: SHX6037

## 2020-09-14 LAB — GLUCOSE, CAPILLARY
Glucose-Capillary: 149 mg/dL — ABNORMAL HIGH (ref 70–99)
Glucose-Capillary: 166 mg/dL — ABNORMAL HIGH (ref 70–99)
Glucose-Capillary: 147 mg/dL — ABNORMAL HIGH (ref 70–99)

## 2020-09-14 SURGERY — POSTERIOR LUMBAR FUSION 4 LEVEL
Anesthesia: General

## 2020-09-14 MED ORDER — CEFAZOLIN SODIUM-DEXTROSE 2-4 GM/100ML-% IV SOLN
2.0000 g | Freq: Once | INTRAVENOUS | Status: AC
  Administered 2020-09-14: 2 g via INTRAVENOUS

## 2020-09-14 MED ORDER — MECLIZINE HCL 25 MG PO TABS
25.0000 mg | ORAL_TABLET | Freq: Four times a day (QID) | ORAL | Status: DC | PRN
  Filled 2020-09-14: qty 1

## 2020-09-14 MED ORDER — BUPIVACAINE-EPINEPHRINE 0.5% -1:200000 IJ SOLN
INTRAMUSCULAR | Status: DC | PRN
  Administered 2020-09-14: 10 mL

## 2020-09-14 MED ORDER — ENOXAPARIN SODIUM 40 MG/0.4ML ~~LOC~~ SOLN
40.0000 mg | SUBCUTANEOUS | Status: DC
  Administered 2020-09-15 – 2020-09-17 (×3): 40 mg via SUBCUTANEOUS
  Filled 2020-09-14 (×3): qty 0.4

## 2020-09-14 MED ORDER — CHLORHEXIDINE GLUCONATE 0.12 % MT SOLN: 15.0000 mL | Freq: Once | OROMUCOSAL | Status: AC

## 2020-09-14 MED ORDER — LINAGLIPTIN 5 MG PO TABS
5.0000 mg | ORAL_TABLET | Freq: Every day | ORAL | Status: DC
  Administered 2020-09-15 – 2020-09-17 (×3): 5 mg via ORAL
  Filled 2020-09-14 (×3): qty 1

## 2020-09-14 MED ORDER — EPHEDRINE SULFATE 50 MG/ML IJ SOLN
INTRAMUSCULAR | Status: DC | PRN
  Administered 2020-09-14: 10 mg via INTRAVENOUS
  Administered 2020-09-14: 15 mg via INTRAVENOUS
  Administered 2020-09-14: 10 mg via INTRAVENOUS

## 2020-09-14 MED ORDER — KETAMINE HCL 50 MG/5ML IJ SOSY
PREFILLED_SYRINGE | INTRAMUSCULAR | Status: AC
  Filled 2020-09-14: qty 5

## 2020-09-14 MED ORDER — ONDANSETRON HCL 4 MG/2ML IJ SOLN
INTRAMUSCULAR | Status: DC | PRN
  Administered 2020-09-14: 4 mg via INTRAVENOUS

## 2020-09-14 MED ORDER — SEVOFLURANE IN SOLN
RESPIRATORY_TRACT | Status: AC
  Filled 2020-09-14: qty 250

## 2020-09-14 MED ORDER — FAMOTIDINE 20 MG PO TABS
ORAL_TABLET | ORAL | Status: AC
  Administered 2020-09-14: 20 mg via ORAL
  Filled 2020-09-14: qty 1

## 2020-09-14 MED ORDER — FENTANYL CITRATE (PF) 100 MCG/2ML IJ SOLN
INTRAMUSCULAR | Status: DC | PRN
  Administered 2020-09-14 (×4): 50 ug via INTRAVENOUS

## 2020-09-14 MED ORDER — HYDROMORPHONE HCL 1 MG/ML IJ SOLN
0.5000 mg | INTRAMUSCULAR | Status: AC | PRN
  Administered 2020-09-14 (×2): 0.5 mg via INTRAVENOUS

## 2020-09-14 MED ORDER — KETAMINE HCL 10 MG/ML IJ SOLN
INTRAMUSCULAR | Status: DC | PRN
  Administered 2020-09-14: 20 mg via INTRAVENOUS
  Administered 2020-09-14: 30 mg via INTRAVENOUS

## 2020-09-14 MED ORDER — ACETAMINOPHEN 10 MG/ML IV SOLN
INTRAVENOUS | Status: DC | PRN
  Administered 2020-09-14: 1000 mg via INTRAVENOUS

## 2020-09-14 MED ORDER — GABAPENTIN 300 MG PO CAPS
300.0000 mg | ORAL_CAPSULE | Freq: Three times a day (TID) | ORAL | Status: DC
  Administered 2020-09-15 – 2020-09-17 (×7): 300 mg via ORAL
  Filled 2020-09-14 (×7): qty 1

## 2020-09-14 MED ORDER — ONDANSETRON HCL 4 MG/2ML IJ SOLN: 4.0000 mg | Freq: Four times a day (QID) | INTRAMUSCULAR | Status: DC | PRN

## 2020-09-14 MED ORDER — PROMETHAZINE HCL 25 MG/ML IJ SOLN
INTRAMUSCULAR | Status: AC
  Filled 2020-09-14: qty 1

## 2020-09-14 MED ORDER — VANCOMYCIN HCL 1250 MG/250ML IV SOLN
1250.0000 mg | Freq: Once | INTRAVENOUS | Status: DC
  Filled 2020-09-14: qty 250

## 2020-09-14 MED ORDER — TAMSULOSIN HCL 0.4 MG PO CAPS
0.4000 mg | ORAL_CAPSULE | Freq: Every day | ORAL | Status: DC
  Administered 2020-09-14 – 2020-09-17 (×4): 0.4 mg via ORAL
  Filled 2020-09-14 (×4): qty 1

## 2020-09-14 MED ORDER — OXYCODONE HCL 5 MG PO TABS
10.0000 mg | ORAL_TABLET | ORAL | Status: DC | PRN
  Administered 2020-09-14 – 2020-09-15 (×3): 10 mg via ORAL
  Filled 2020-09-14 (×2): qty 2

## 2020-09-14 MED ORDER — ACETAMINOPHEN 325 MG PO TABS: 650.0000 mg | ORAL_TABLET | ORAL | Status: DC | PRN

## 2020-09-14 MED ORDER — OXYCODONE HCL 5 MG PO TABS
ORAL_TABLET | ORAL | Status: AC
  Filled 2020-09-14: qty 2

## 2020-09-14 MED ORDER — PHENYLEPHRINE HCL (PRESSORS) 10 MG/ML IV SOLN
INTRAVENOUS | Status: DC | PRN
  Administered 2020-09-14: 100 ug via INTRAVENOUS
  Administered 2020-09-14: 200 ug via INTRAVENOUS
  Administered 2020-09-14 (×2): 100 ug via INTRAVENOUS

## 2020-09-14 MED ORDER — SODIUM CHLORIDE 0.9% FLUSH
3.0000 mL | Freq: Two times a day (BID) | INTRAVENOUS | Status: DC
  Administered 2020-09-15 – 2020-09-17 (×4): 3 mL via INTRAVENOUS

## 2020-09-14 MED ORDER — MIDAZOLAM HCL 2 MG/2ML IJ SOLN
INTRAMUSCULAR | Status: AC
  Filled 2020-09-14: qty 2

## 2020-09-14 MED ORDER — LACTATED RINGERS IV SOLN: INTRAVENOUS | Status: DC | PRN

## 2020-09-14 MED ORDER — ACETAMINOPHEN 500 MG PO TABS
1000.0000 mg | ORAL_TABLET | Freq: Four times a day (QID) | ORAL | Status: DC
  Filled 2020-09-14: qty 2

## 2020-09-14 MED ORDER — CHLORHEXIDINE GLUCONATE 0.12 % MT SOLN
OROMUCOSAL | Status: AC
  Filled 2020-09-14: qty 15

## 2020-09-14 MED ORDER — KETOROLAC TROMETHAMINE 15 MG/ML IJ SOLN
7.5000 mg | Freq: Four times a day (QID) | INTRAMUSCULAR | Status: AC
  Administered 2020-09-14 – 2020-09-15 (×3): 7.5 mg via INTRAVENOUS
  Filled 2020-09-14 (×2): qty 1

## 2020-09-14 MED ORDER — CEFAZOLIN SODIUM-DEXTROSE 2-4 GM/100ML-% IV SOLN
INTRAVENOUS | Status: AC
  Filled 2020-09-14: qty 100

## 2020-09-14 MED ORDER — HYDROMORPHONE HCL 1 MG/ML IJ SOLN
INTRAMUSCULAR | Status: AC
  Filled 2020-09-14: qty 1

## 2020-09-14 MED ORDER — CHLORTHALIDONE 25 MG PO TABS
50.0000 mg | ORAL_TABLET | Freq: Every day | ORAL | Status: DC
  Administered 2020-09-15 – 2020-09-17 (×3): 50 mg via ORAL
  Filled 2020-09-14 (×3): qty 2

## 2020-09-14 MED ORDER — FENTANYL CITRATE (PF) 100 MCG/2ML IJ SOLN
25.0000 ug | INTRAMUSCULAR | Status: DC | PRN
  Administered 2020-09-14 (×2): 50 ug via INTRAVENOUS

## 2020-09-14 MED ORDER — FAMOTIDINE 20 MG PO TABS: 20.0000 mg | ORAL_TABLET | Freq: Once | ORAL | Status: AC

## 2020-09-14 MED ORDER — FENTANYL CITRATE (PF) 100 MCG/2ML IJ SOLN
INTRAMUSCULAR | Status: AC
  Filled 2020-09-14: qty 2

## 2020-09-14 MED ORDER — METHOCARBAMOL 500 MG PO TABS
500.0000 mg | ORAL_TABLET | Freq: Four times a day (QID) | ORAL | Status: DC | PRN
  Filled 2020-09-14: qty 1

## 2020-09-14 MED ORDER — ONDANSETRON HCL 4 MG PO TABS: 4.0000 mg | ORAL_TABLET | Freq: Four times a day (QID) | ORAL | Status: DC | PRN

## 2020-09-14 MED ORDER — VANCOMYCIN HCL IN DEXTROSE 1-5 GM/200ML-% IV SOLN: 1000.0000 mg | Freq: Once | INTRAVENOUS | Status: DC

## 2020-09-14 MED ORDER — INSULIN ASPART 100 UNIT/ML ~~LOC~~ SOLN: 0.0000 [IU] | Freq: Every day | SUBCUTANEOUS | Status: DC

## 2020-09-14 MED ORDER — SUCCINYLCHOLINE CHLORIDE 20 MG/ML IJ SOLN
INTRAMUSCULAR | Status: DC | PRN
  Administered 2020-09-14: 100 mg via INTRAVENOUS

## 2020-09-14 MED ORDER — OXYCODONE HCL 5 MG PO TABS: 5.0000 mg | ORAL_TABLET | Freq: Once | ORAL | Status: DC | PRN

## 2020-09-14 MED ORDER — HYDROMORPHONE HCL 1 MG/ML IJ SOLN
INTRAMUSCULAR | Status: DC | PRN
  Administered 2020-09-14: .25 mg via INTRAVENOUS
  Administered 2020-09-14: .5 mg via INTRAVENOUS
  Administered 2020-09-14: .25 mg via INTRAVENOUS

## 2020-09-14 MED ORDER — OXYCODONE HCL 5 MG PO TABS
5.0000 mg | ORAL_TABLET | ORAL | Status: DC | PRN
  Administered 2020-09-16 – 2020-09-17 (×5): 10 mg via ORAL
  Filled 2020-09-14 (×5): qty 2

## 2020-09-14 MED ORDER — SENNOSIDES-DOCUSATE SODIUM 8.6-50 MG PO TABS
1.0000 | ORAL_TABLET | Freq: Two times a day (BID) | ORAL | Status: DC
  Administered 2020-09-14 – 2020-09-17 (×6): 1 via ORAL
  Filled 2020-09-14 (×6): qty 1

## 2020-09-14 MED ORDER — EMPAGLIFLOZIN 25 MG PO TABS
25.0000 mg | ORAL_TABLET | Freq: Every day | ORAL | Status: DC
  Administered 2020-09-15 – 2020-09-17 (×3): 25 mg via ORAL
  Filled 2020-09-14 (×3): qty 1

## 2020-09-14 MED ORDER — SODIUM CHLORIDE FLUSH 0.9 % IV SOLN
INTRAVENOUS | Status: AC
  Filled 2020-09-14: qty 10

## 2020-09-14 MED ORDER — LABETALOL HCL 5 MG/ML IV SOLN
5.0000 mg | INTRAVENOUS | Status: DC | PRN
  Filled 2020-09-14: qty 4

## 2020-09-14 MED ORDER — HYDROMORPHONE HCL 1 MG/ML IJ SOLN
0.5000 mg | INTRAMUSCULAR | Status: DC | PRN
  Administered 2020-09-14 – 2020-09-15 (×3): 0.5 mg via INTRAVENOUS
  Filled 2020-09-14 (×3): qty 1

## 2020-09-14 MED ORDER — ORAL CARE MOUTH RINSE
15.0000 mL | Freq: Once | OROMUCOSAL | Status: AC
  Administered 2020-09-14: 15 mL via OROMUCOSAL

## 2020-09-14 MED ORDER — PHENOL 1.4 % MT LIQD
1.0000 | OROMUCOSAL | Status: DC | PRN
  Filled 2020-09-14: qty 177

## 2020-09-14 MED ORDER — POTASSIUM CHLORIDE IN NACL 20-0.9 MEQ/L-% IV SOLN
INTRAVENOUS | Status: DC
  Filled 2020-09-14 (×8): qty 1000

## 2020-09-14 MED ORDER — PROPOFOL 10 MG/ML IV BOLUS
INTRAVENOUS | Status: DC | PRN
  Administered 2020-09-14: 150 mg via INTRAVENOUS
  Administered 2020-09-14 (×2): 50 mg via INTRAVENOUS

## 2020-09-14 MED ORDER — HYDROMORPHONE HCL 1 MG/ML IJ SOLN
INTRAMUSCULAR | Status: AC
  Administered 2020-09-14: 0.5 mg via INTRAVENOUS
  Filled 2020-09-14: qty 1

## 2020-09-14 MED ORDER — KETOROLAC TROMETHAMINE 15 MG/ML IJ SOLN
INTRAMUSCULAR | Status: AC
  Filled 2020-09-14: qty 1

## 2020-09-14 MED ORDER — ACETAMINOPHEN 650 MG RE SUPP: 650.0000 mg | RECTAL | Status: DC | PRN

## 2020-09-14 MED ORDER — LIDOCAINE HCL (CARDIAC) PF 100 MG/5ML IV SOSY
PREFILLED_SYRINGE | INTRAVENOUS | Status: DC | PRN
  Administered 2020-09-14: 80 mg via INTRAVENOUS

## 2020-09-14 MED ORDER — MIDAZOLAM HCL 2 MG/2ML IJ SOLN
INTRAMUSCULAR | Status: DC | PRN
  Administered 2020-09-14 (×2): 1 mg via INTRAVENOUS

## 2020-09-14 MED ORDER — ZOLPIDEM TARTRATE 5 MG PO TABS
5.0000 mg | ORAL_TABLET | Freq: Every day | ORAL | Status: DC
  Administered 2020-09-14 – 2020-09-16 (×3): 5 mg via ORAL
  Filled 2020-09-14 (×3): qty 1

## 2020-09-14 MED ORDER — SODIUM CHLORIDE 0.9 % IV SOLN: INTRAVENOUS | Status: DC

## 2020-09-14 MED ORDER — DEXMEDETOMIDINE HCL 200 MCG/2ML IV SOLN
INTRAVENOUS | Status: DC | PRN
  Administered 2020-09-14: 24 ug via INTRAVENOUS

## 2020-09-14 MED ORDER — SODIUM CHLORIDE 0.9 % IV SOLN: 250.0000 mL | INTRAVENOUS | Status: DC

## 2020-09-14 MED ORDER — THROMBIN 5000 UNITS EX SOLR
CUTANEOUS | Status: DC | PRN
  Administered 2020-09-14: 5000 [IU] via TOPICAL

## 2020-09-14 MED ORDER — PHENYLEPHRINE HCL-NACL 20-0.9 MG/250ML-% IV SOLN
INTRAVENOUS | Status: DC | PRN
  Administered 2020-09-14: 30 ug/min via INTRAVENOUS

## 2020-09-14 MED ORDER — DEXAMETHASONE SODIUM PHOSPHATE 10 MG/ML IJ SOLN
INTRAMUSCULAR | Status: DC | PRN
  Administered 2020-09-14: 5 mg via INTRAVENOUS

## 2020-09-14 MED ORDER — ACETAMINOPHEN 10 MG/ML IV SOLN
INTRAVENOUS | Status: AC
  Filled 2020-09-14: qty 100

## 2020-09-14 MED ORDER — METHOCARBAMOL 1000 MG/10ML IJ SOLN
500.0000 mg | Freq: Four times a day (QID) | INTRAVENOUS | Status: DC | PRN
  Administered 2020-09-14: 500 mg via INTRAVENOUS
  Filled 2020-09-14: qty 5

## 2020-09-14 MED ORDER — GABAPENTIN 300 MG PO CAPS
ORAL_CAPSULE | ORAL | Status: AC
  Administered 2020-09-14: 300 mg via ORAL
  Filled 2020-09-14: qty 1

## 2020-09-14 MED ORDER — CELECOXIB 200 MG PO CAPS
200.0000 mg | ORAL_CAPSULE | Freq: Two times a day (BID) | ORAL | Status: DC
  Administered 2020-09-15 – 2020-09-17 (×4): 200 mg via ORAL
  Filled 2020-09-14 (×4): qty 1

## 2020-09-14 MED ORDER — PROMETHAZINE HCL 25 MG/ML IJ SOLN
6.2500 mg | INTRAMUSCULAR | Status: AC | PRN
  Administered 2020-09-14: 12.5 mg via INTRAVENOUS

## 2020-09-14 MED ORDER — ATORVASTATIN CALCIUM 20 MG PO TABS
40.0000 mg | ORAL_TABLET | Freq: Every day | ORAL | Status: DC
  Administered 2020-09-14 – 2020-09-16 (×3): 40 mg via ORAL
  Filled 2020-09-14 (×3): qty 2

## 2020-09-14 MED ORDER — BISACODYL 5 MG PO TBEC
5.0000 mg | DELAYED_RELEASE_TABLET | Freq: Every day | ORAL | Status: DC | PRN
  Administered 2020-09-16: 5 mg via ORAL
  Filled 2020-09-14: qty 1

## 2020-09-14 MED ORDER — FENTANYL CITRATE (PF) 100 MCG/2ML IJ SOLN
INTRAMUSCULAR | Status: AC
  Administered 2020-09-14: 50 ug via INTRAVENOUS
  Filled 2020-09-14: qty 2

## 2020-09-14 MED ORDER — INSULIN ASPART 100 UNIT/ML ~~LOC~~ SOLN
0.0000 [IU] | Freq: Three times a day (TID) | SUBCUTANEOUS | Status: DC
  Administered 2020-09-15: 1 [IU] via SUBCUTANEOUS
  Administered 2020-09-16: 2 [IU] via SUBCUTANEOUS
  Administered 2020-09-17: 1 [IU] via SUBCUTANEOUS
  Filled 2020-09-14 (×3): qty 1

## 2020-09-14 MED ORDER — OXYCODONE HCL 5 MG/5ML PO SOLN: 5.0000 mg | Freq: Once | ORAL | Status: DC | PRN

## 2020-09-14 MED ORDER — SODIUM CHLORIDE 0.9% FLUSH
3.0000 mL | INTRAVENOUS | Status: DC | PRN
  Administered 2020-09-16: 3 mL via INTRAVENOUS

## 2020-09-14 MED ORDER — MEPERIDINE HCL 50 MG/ML IJ SOLN: 6.2500 mg | INTRAMUSCULAR | Status: DC | PRN

## 2020-09-14 MED ORDER — VANCOMYCIN HCL IN DEXTROSE 1-5 GM/200ML-% IV SOLN
INTRAVENOUS | Status: AC
  Administered 2020-09-14: 1000 mg
  Filled 2020-09-14: qty 200

## 2020-09-14 MED ORDER — LISINOPRIL 20 MG PO TABS
20.0000 mg | ORAL_TABLET | Freq: Every day | ORAL | Status: DC
  Filled 2020-09-14 (×2): qty 1

## 2020-09-14 MED ORDER — TESTOSTERONE CYPIONATE 200 MG/ML IM SOLN
200.0000 mg | INTRAMUSCULAR | Status: DC
  Administered 2020-09-14: 200 mg via INTRAMUSCULAR

## 2020-09-14 MED ORDER — MENTHOL 3 MG MT LOZG
1.0000 | LOZENGE | OROMUCOSAL | Status: DC | PRN
  Filled 2020-09-14: qty 9

## 2020-09-14 SURGICAL SUPPLY — 84 items
BLADE BOVIE TIP EXT 4 (BLADE) ×2 IMPLANT
BONE CANC CHIPS 40CC CAN1/2 (Bone Implant) ×6 IMPLANT
BUR NEURO DRILL SOFT 3.0X3.8M (BURR) ×2 IMPLANT
CANISTER SUCT 1200ML W/VALVE (MISCELLANEOUS) IMPLANT
CHIPS CANC BONE 40CC CAN1/2 (Bone Implant) ×3 IMPLANT
CHLORAPREP W/TINT 26 (MISCELLANEOUS) ×4 IMPLANT
CONNECTOR RELINE ROTATE 5-6MM (Connector) ×4 IMPLANT
CORD BIP STRL DISP 12FT (MISCELLANEOUS) ×2 IMPLANT
COUNTER NEEDLE 20/40 LG (NEEDLE) ×2 IMPLANT
COVER BACK TABLE REUSABLE LG (DRAPES) ×2 IMPLANT
COVER WAND RF STERILE (DRAPES) ×2 IMPLANT
CUP MEDICINE 2OZ PLAST GRAD ST (MISCELLANEOUS) ×2 IMPLANT
DERMABOND ADVANCED (GAUZE/BANDAGES/DRESSINGS) ×1
DERMABOND ADVANCED .7 DNX12 (GAUZE/BANDAGES/DRESSINGS) ×1 IMPLANT
DRAPE C ARM PK CFD 31 SPINE (DRAPES) ×4 IMPLANT
DRAPE C-ARMOR (DRAPES) ×2 IMPLANT
DRAPE INCISE IOBAN 66X45 STRL (DRAPES) ×2 IMPLANT
DRAPE LAPAROTOMY 100X77 ABD (DRAPES) ×2 IMPLANT
DRAPE MICROSCOPE SPINE 48X150 (DRAPES) IMPLANT
DRAPE SURG 17X11 SM STRL (DRAPES) ×8 IMPLANT
DRSG OPSITE POSTOP 4X6 (GAUZE/BANDAGES/DRESSINGS) IMPLANT
DRSG TEGADERM 2-3/8X2-3/4 SM (GAUZE/BANDAGES/DRESSINGS) IMPLANT
DRSG TEGADERM 4X4.75 (GAUZE/BANDAGES/DRESSINGS) IMPLANT
DRSG TEGADERM 6X8 (GAUZE/BANDAGES/DRESSINGS) IMPLANT
DRSG TELFA 3X8 NADH (GAUZE/BANDAGES/DRESSINGS) IMPLANT
DRSG TELFA 4X3 1S NADH ST (GAUZE/BANDAGES/DRESSINGS) IMPLANT
ELECT CAUTERY BLADE TIP 2.5 (TIP) ×2
ELECT EZSTD 165MM 6.5IN (MISCELLANEOUS) ×2
ELECT REM PT RETURN 9FT ADLT (ELECTROSURGICAL) ×2
ELECTRODE CAUTERY BLDE TIP 2.5 (TIP) ×1 IMPLANT
ELECTRODE EZSTD 165MM 6.5IN (MISCELLANEOUS) ×1 IMPLANT
ELECTRODE REM PT RTRN 9FT ADLT (ELECTROSURGICAL) ×1 IMPLANT
FEE INTRAOP MONITOR IMPULS NCS (MISCELLANEOUS) IMPLANT
GLOVE SURG SYN 7.0 (GLOVE) IMPLANT
GLOVE SURG SYN 8.5  E (GLOVE) ×6
GLOVE SURG SYN 8.5 E (GLOVE) ×6 IMPLANT
GLOVE SURG UNDER POLY LF SZ7 (GLOVE) IMPLANT
GOWN SRG XL LVL 3 NONREINFORCE (GOWNS) ×1 IMPLANT
GOWN STRL NON-REIN TWL XL LVL3 (GOWNS) ×1
GOWN STRL REUS W/ TWL XL LVL3 (GOWN DISPOSABLE) ×1 IMPLANT
GOWN STRL REUS W/TWL XL LVL3 (GOWN DISPOSABLE) ×1
GRADUATE 1200CC STRL 31836 (MISCELLANEOUS) ×2 IMPLANT
HEMOVAC 400CC 10FR (MISCELLANEOUS) ×4 IMPLANT
INTRAOP MONITOR FEE IMPULS NCS (MISCELLANEOUS)
INTRAOP MONITOR FEE IMPULSE (MISCELLANEOUS)
IRRIGATION SURGIPHOR STRL (IV SOLUTION) ×2 IMPLANT
KIT ASPI BONE MRW 60ML (KITS) ×2 IMPLANT
KIT INFUSE LRG II (Orthopedic Implant) ×2 IMPLANT
KIT SPINAL PRONEVIEW (KITS) ×2 IMPLANT
KIT TURNOVER KIT A (KITS) ×2 IMPLANT
KNIFE BAYONET SHORT DISCETOMY (MISCELLANEOUS) IMPLANT
MANIFOLD NEPTUNE II (INSTRUMENTS) ×2 IMPLANT
MARKER SKIN DUAL TIP RULER LAB (MISCELLANEOUS) ×2 IMPLANT
NDL SAFETY ECLIPSE 18X1.5 (NEEDLE) ×1 IMPLANT
NEEDLE HYPO 18GX1.5 SHARP (NEEDLE) ×1
NEEDLE HYPO 22GX1.5 SAFETY (NEEDLE) ×2 IMPLANT
PACK LAMINECTOMY NEURO (CUSTOM PROCEDURE TRAY) ×2 IMPLANT
PAD ARMBOARD 7.5X6 YLW CONV (MISCELLANEOUS) ×2 IMPLANT
PENCIL ELECTRO HAND CTR (MISCELLANEOUS) ×2 IMPLANT
PREVENA INCISION MGT 90 150 (MISCELLANEOUS) ×2 IMPLANT
PUTTY DBX 10CC (Bone Implant) ×4 IMPLANT
ROD RELINE TI LATERAL MED OFF (Rod) ×2 IMPLANT
ROD RELINE-O 5.5X300 STRT (Rod) ×1 IMPLANT
ROD RELINE-O 5.5X300MM STRT (Rod) ×1 IMPLANT
SCREW LOCK RELINE 5.5 TULIP (Screw) ×20 IMPLANT
SCREW LOCK RELINE CLOSED TULIP (Screw) ×4 IMPLANT
SCREW RELINE-O POLY 6.5X45 (Screw) ×16 IMPLANT
SPOGE SURGIFLO 8M (HEMOSTASIS) ×1
SPONGE GAUZE 2X2 8PLY STRL LF (GAUZE/BANDAGES/DRESSINGS) IMPLANT
SPONGE SURGIFLO 8M (HEMOSTASIS) ×1 IMPLANT
STAPLER SKIN PROX 35W (STAPLE) ×2 IMPLANT
SUT DVC VLOC 3-0 CL 6 P-12 (SUTURE) ×2 IMPLANT
SUT ETHILON 3-0 FS-10 30 BLK (SUTURE)
SUT V-LOC 90 ABS DVC 3-0 CL (SUTURE) ×4 IMPLANT
SUT VIC AB 0 CT1 18XCR BRD 8 (SUTURE) ×4 IMPLANT
SUT VIC AB 0 CT1 27 (SUTURE) ×1
SUT VIC AB 0 CT1 27XCR 8 STRN (SUTURE) ×1 IMPLANT
SUT VIC AB 0 CT1 8-18 (SUTURE) ×4
SUT VIC AB 2-0 CT1 18 (SUTURE) ×12 IMPLANT
SUTURE EHLN 3-0 FS-10 30 BLK (SUTURE) IMPLANT
SYR 30ML LL (SYRINGE) ×4 IMPLANT
TOWEL OR 17X26 4PK STRL BLUE (TOWEL DISPOSABLE) ×6 IMPLANT
TRAY FOLEY MTR SLVR 16FR STAT (SET/KITS/TRAYS/PACK) ×2 IMPLANT
TUBING CONNECTING 10 (TUBING) ×2 IMPLANT

## 2020-09-14 NOTE — Op Note (Signed)
Indications: Howard Murphy is a 65 yo male who presented with lumbar adjacent segment disease with spondylolisthesis.  He attempted and failed conservative management without improvement.  Findings: adjacent segment disease  Preoperative Diagnosis: Lumbar adjacent segment disease with spondylolisthesis Postoperative Diagnosis: same   EBL: 200 ml IVF: 1500 ml Drains: 2 placed Disposition: Extubated and Stable to PACU Complications: none  A foley catheter was placed.   Preoperative Note:   Risks of surgery discussed include: infection, bleeding, stroke, coma, death, paralysis, CSF leak, nerve/spinal cord injury, numbness, tingling, weakness, complex regional pain syndrome, recurrent stenosis and/or disc herniation, vascular injury, development of instability, neck/back pain, need for further surgery, persistent symptoms, development of deformity, and the risks of anesthesia. The patient understood these risks and agreed to proceed.  Operative Note:  1. Exploration of prior fusion 2. Posterolateral arthrodesis T10 to L2 3. Posterior segmental instrumentation T10 to L2 4. Lumbar decompression including central decompression, bilateral medial facetectomies, and bilateral foraminotomies at L1/2 5. Harvesting of autograft via the same incision 6. Bone Marrow aspiration    The patient was brought to the Operating Room, intubated and turned into the prone position. All pressure points were checked and double checked. The prior incision was marked. The patient was prepped and draped in the standard fashion. A full timeout was performed. Preoperative antibiotics were given. The incision was injected with local anesthetic.  The incision was opened with a scalpel, then the soft tissues divided with the Bovie. Self-retaining retractors were placed. The paraspinus muscles were reflected laterally in subperiosteal fashion until the transverse processes were visible. The prior L2 and L3 pedicle screws  were identified on each side, and the transverse processes there exposed.  An area between L2 and L3 on the rod was identified for placement of a rod to rod connector.   Via a separate incision, the left iliac crest was accessed and 60 ml of bone marrow aspirated.  This was handed off the table for concentration and used later in the case for autograft.  The prior implants were used for localization.  We then continued superior exposure up to T10.    The self-retaining retractors were repositioned. We then placed pedicle screws.   At T10 on one side, a starting point was chosen based on anatomic landmarks, then breached with a high speed drill. A pedicle finder probe was used to cannulate the pedicle, then the balltip probe used to confirm lack of breach. The tract was tapped, re-checked with the balltip probe, then a 6.5 x 45 mm pedicle screw was placed. The procedure was then repeated contralaterally and the same size screw placed.  At T11 on one side, a starting point was chosen based on anatomic landmarks, then breached with a high speed drill. A pedicle finder probe was used to cannulate the pedicle, then the balltip probe used to confirm lack of breach. The tract was tapped, re-checked with the balltip probe, then a 6.5 x 45 mm pedicle screw was placed. The procedure was then repeated contralaterally and the same size screw placed.  At T12 on one side, a starting point was chosen based on anatomic landmarks, then breached with a high speed drill. A pedicle finder probe was used to cannulate the pedicle, then the balltip probe used to confirm lack of breach. The tract was tapped, re-checked with the balltip probe, then a 6.5 x 45 mm pedicle screw was placed. The procedure was then repeated contralaterally and the same size screw placed.  At L1  on one side, a starting point was chosen based on anatomic landmarks, then breached with a high speed drill. A pedicle finder probe was used to cannulate  the pedicle, then the balltip probe used to confirm lack of breach. The tract was tapped, re-checked with the balltip probe, then a 6.5 x 45 mm pedicle screw was placed. The procedure was then repeated contralaterally and the same size screw placed.  After placement of pedicle screws, we moved to decompression.  To decompress the neural elements at L1/2, the drill was used to extend the laminoforaminotomy laterally until the traversing and exiting nerve roots were fully decompressed.  Additionally, the drill was used to removed the base of the spinous process and the contralateral lamina until the contralateral medial facet was identified and palpated. Using a [facility] and curettes, the ligamentum flavum at L1/2 was dissected completely free of the dura. Using punches and curettes, the ligamentum flavum was removed in its entirety at L1/2 for a complete decompression from facet to facet.  The contralateral L2 nerve root was palpated, and the Kerrison punch used to remove the remainder of the medial facet and foramen until the dural sac and nerve roots were complete decompressed.  Rods were measured to length, cut, and shaped. The rods were secured using locking caps to manufacturer's specifications. Each rod was secured to the T10-L1 pedicle screws, and then to a side-to-side rod connector between L2 and L3.    Final AP and lateral radiographs were taken to confirm placement of instrumentation and appropriate alignment. The wound was copiously irrigated, then the external surfaces of the remaining lamina, facet, and transverse processes from T10 to L2 were decorticated. A mixture of allograft and autograft was placed over the decorticated surfaces for arthrodesis. BMP was also utilized.   2 drains were placed subfascially.   After hemostasis, the wound was closed in layers with 0 and 2-0 vicryl. 3-0 monocryl and a wound vac were applied to the incision.  The patient was then flipped supine and  extubated with incident. All counts were correct times 2 at the end of the case. No immediate complications were noted.  Liliane Bade PA assisted in the entire case.  Meade Maw MD

## 2020-09-14 NOTE — Anesthesia Procedure Notes (Signed)
Procedure Name: Intubation Date/Time: 09/14/2020 12:57 PM Performed by: Justus Memory, CRNA Pre-anesthesia Checklist: Patient identified, Patient being monitored, Timeout performed, Emergency Drugs available and Suction available Patient Re-evaluated:Patient Re-evaluated prior to induction Oxygen Delivery Method: Circle system utilized Preoxygenation: Pre-oxygenation with 100% oxygen Induction Type: IV induction Ventilation: Mask ventilation without difficulty Laryngoscope Size: Mac and 3 Grade View: Grade I Tube type: Oral Tube size: 7.0 mm Number of attempts: 1 Airway Equipment and Method: Stylet and Video-laryngoscopy Placement Confirmation: ETT inserted through vocal cords under direct vision,  positive ETCO2 and breath sounds checked- equal and bilateral Secured at: 21 cm Tube secured with: Tape Dental Injury: Teeth and Oropharynx as per pre-operative assessment  Difficulty Due To: Difficulty was anticipated and Difficult Airway- due to anterior larynx Future Recommendations: Recommend- induction with short-acting agent, and alternative techniques readily available

## 2020-09-14 NOTE — Anesthesia Preprocedure Evaluation (Signed)
Anesthesia Evaluation  Patient identified by MRN, date of birth, ID band Patient awake    Reviewed: Allergy & Precautions, NPO status , Patient's Chart, lab work & pertinent test results  History of Anesthesia Complications Negative for: history of anesthetic complications  Airway Mallampati: II  TM Distance: >3 FB Neck ROM: Full    Dental  (+) Poor Dentition   Pulmonary neg pulmonary ROS, neg sleep apnea, neg COPD,    breath sounds clear to auscultation- rhonchi (-) wheezing      Cardiovascular Exercise Tolerance: Good hypertension, Pt. on medications (-) CAD, (-) Past MI, (-) Cardiac Stents and (-) CABG  Rhythm:Regular Rate:Normal - Systolic murmurs and - Diastolic murmurs    Neuro/Psych  Headaches, neg Seizures negative psych ROS   GI/Hepatic Neg liver ROS, GERD  ,  Endo/Other  diabetes, Oral Hypoglycemic Agents  Renal/GU Renal disease: hx of nephrolithiasis.     Musculoskeletal  (+) Arthritis ,   Abdominal (+) - obese,   Peds  Hematology negative hematology ROS (+)   Anesthesia Other Findings Past Medical History: No date: Arthritis     Comment:  Osteo vs rheumatoid ? 05/05/2013: Back ache No date: Diabetes mellitus without complication (HCC) No date: GERD (gastroesophageal reflux disease) No date: Headache     Comment:  MIGRAINES No date: History of kidney stones No date: Hyperlipemia No date: Hypertension No date: Kidney stones No date: Kidney stones No date: Nephrolithiasis No date: Shortness of breath dyspnea   Reproductive/Obstetrics                            Anesthesia Physical Anesthesia Plan  ASA: II  Anesthesia Plan: General   Post-op Pain Management:    Induction: Intravenous  PONV Risk Score and Plan: 1 and Ondansetron and Dexamethasone  Airway Management Planned: Oral ETT  Additional Equipment:   Intra-op Plan:   Post-operative Plan: Extubation in  OR  Informed Consent: I have reviewed the patients History and Physical, chart, labs and discussed the procedure including the risks, benefits and alternatives for the proposed anesthesia with the patient or authorized representative who has indicated his/her understanding and acceptance.     Dental advisory given  Plan Discussed with: CRNA and Anesthesiologist  Anesthesia Plan Comments:         Anesthesia Quick Evaluation

## 2020-09-14 NOTE — H&P (Signed)
History of Present Illness: 09/14/2020 Mr. Kidane presents today for surgical intervention with continued symptoms.  08/12/2020  Mr. Leichter presents back to see me.  He has continued severe back pain and has failed conservative management.   04/27/20 Mr. Elmquist returns to see me. He continues to have severe back pain and symptoms. He is walking somewhat better. He is back on his diabetes medications.  03/25/2020 Mr. Oxley returns to see me. He went to the emergency department last night. He is having severe back pain. He is having some tingling down his legs.  He went to physical therapy on 2 occasions without improvement. He also had an epidural steroid injection x2 which did not help.  12/31/2019 Mr. Dusten Ellinwood is here today with a chief complaint of low back pain/numbness/tingling. He reports that the pain radiates down the back of his right thigh and to his right groin. The prednisone taper did not provide any relief. He states his back bothers him more than his leg.  He had back surgery approximately 2 years ago, after which some of his leg symptoms improved but he is continued to have back pain. He also has some numbness in his buttocks. That does not really bother him. His back pain is his worst issue. Nothing has really helped his pain. He worsened enough to go to the emergency department approximately 2 weeks ago with severe pain into his right groin. He denies weakness or bowel or bladder dysfunction.  Conservative measures:  Physical therapy: has not participated Multimodal medical therapy including regular antiinflammatories: oxycodone, prednisone, cyclobenzaprine Injections: has tried epidural steroid injections in the past (most recently in 2019)  Past Surgery: multiple lumbar surgeries, most recently L2-S2, L2-L3 XLIF on 04/03/18  Lennie Hummer has no symptoms of cervical myelopathy.  The symptoms are causing a significant impact on the patient's life.   Review of  Systems:  A 10 point review of systems is negative, except for the pertinent positives and negatives detailed in the HPI.  Past Medical History: Past Medical History:  Diagnosis Date  . Benign fibroma of prostate  . Benign prostatic hyperplasia  . Bertolotti's syndrome  . Carpal tunnel syndrome  . Diabetes mellitus type 2, uncomplicated (CMS-HCC)  . Dyspnea  . GERD (gastroesophageal reflux disease)  . Headache  . Hypercholesteremia  . Hypertension  . Kidney stones  . Nephrolithiasis  . RA (rheumatoid arthritis) (CMS-HCC)  . Rheumatoid arthritis (CMS-HCC)   Past Surgical History: Past Surgical History:  Procedure Laterality Date  . APPENDECTOMY 12/18/2017  . ARTHROSCOPIC ROTATOR CUFF REPAIR 01/16/2015  . back surgery 08/09/2016  . back surgery 2015  x3  . CHOLECYSTECTOMY 2002  . COLONOSCOPY 04/11/2015  Adenomatous Polyp  . cystoscopy with ureteroscopy and stent placement (right) [id number]  . EGD 04/11/2015  No repeat per RTE  . Esophagogastroduodenoscopy 04/11/2015  . Posterior lumbar fusion L2-S2, L2-3 XLIF 04/02/2018  Dr Meade Maw at [facility]  . right shoulder surgery 2016  . shoulder surgery Left 06/2015  . SPINE SURGERY    Allergies  Allergen Reactions  . Adhesive [Tape] Other (See Comments)    Honeycomb dressing, blisters    Current Meds  Medication Sig  . atorvastatin (LIPITOR) 40 MG tablet Take 1 tablet (40 mg total) by mouth at bedtime.  . chlorthalidone (HYGROTON) 50 MG tablet Take 50 mg by mouth daily.  Marland Kitchen ibuprofen (ADVIL) 800 MG tablet Take 800 mg by mouth 3 (three) times daily.  Marland Kitchen JANUVIA 100 MG tablet Take 100 mg  by mouth daily.  Marland Kitchen JARDIANCE 25 MG TABS tablet Take 25 mg by mouth daily.  Marland Kitchen lisinopril (ZESTRIL) 20 MG tablet Take 20 mg by mouth daily.  . meclizine (ANTIVERT) 25 MG tablet Take 25 mg by mouth every 6 (six) hours as needed for dizziness.  Marland Kitchen oxyCODONE-acetaminophen (PERCOCET) 10-325 MG tablet Take 1 tablet by mouth every 6 (six) hours  as needed for pain. Must last 30 days.  . tamsulosin (FLOMAX) 0.4 MG CAPS capsule Take 1 capsule (0.4 mg total) by mouth daily.  Marland Kitchen testosterone cypionate (DEPOTESTOSTERONE CYPIONATE) 200 MG/ML injection Inject 200 mg into the muscle every 7 (seven) days.  Marland Kitchen zolpidem (AMBIEN) 10 MG tablet Take 10 mg by mouth at bedtime.  . [DISCONTINUED] oxyCODONE-acetaminophen (PERCOCET) 10-325 MG tablet Take 1 tablet by mouth every 8 (eight) hours as needed for pain. Must last 30 days.    Social History: Social History   Tobacco Use  . Smoking status: Never Smoker  . Smokeless tobacco: Former Systems developer  Types: Chew  Substance Use Topics  . Alcohol use: No  . Drug use: No   Family Medical History: Family History  Problem Relation Age of Onset  . Breast cancer Mother  . Coronary Artery Disease (Blocked arteries around heart) Father  . Stroke Father   Physical Examination:  Vitals:   09/14/20 1216  BP: 121/69  Pulse: 80  Resp: 14  Temp: (!) 97.5 F (36.4 C)  SpO2: 95%   Heart sounds normal no MRG. Chest Clear to Auscultation Bilaterally.   General: Patient is well developed, well nourished, calm, collected, and in no apparent distress. Attention to examination is appropriate.  Psychiatric: Patient is non-anxious.  Head: Pupils equal, round, and reactive to light.  ENT: Oral mucosa appears well hydrated.  Neck: Supple. Full range of motion.  Respiratory: Patient is breathing without any difficulty.  Extremities: No edema.  Vascular: Palpable dorsal pedal pulses.  Skin: On exposed skin, there are no abnormal skin lesions.  NEUROLOGICAL:   Awake, alert, oriented to person, place, and time. Speech is clear and fluent. Fund of knowledge is appropriate.   Cranial Nerves: Pupils equal round and reactive to light. Facial tone is symmetric. Facial sensation is symmetric. Shoulder shrug is symmetric. Tongue protrusion is midline. There is no pronator drift.  ROM of spine:  Diminished. Strength: Side Biceps Triceps Deltoid Interossei Grip Wrist Ext. Wrist Flex.  R 5 5 5 5 5 5 5   L 5 5 5 5 5 5 5    Side Iliopsoas Quads Hamstring PF DF EHL  R 5 5 5 5 5 5   L 5 5 5 5 5 5    Reflexes are 1+ and symmetric at the biceps, triceps, brachioradialis, patella and achilles. Hoffman's is absent. Clonus is not present. Toes are down-going.  Bilateral upper and lower extremity sensation is intact to light touch.  Gait is antalgic.No evidence of dysmetria noted.  Medical Decision Making  Imaging: MRI L spine 12/23/2019  T12-L1: Sagittal sequences only. No significant disc protrusion,  foraminal stenosis, or canal stenosis.   L1-L2: Progressive disc height loss with mild diffuse disc bulge.  Superimposed small central disc extrusion with 7 mm caudal migration  of disc material centrally. Mild bilateral facet hypertrophy. There  is moderate canal stenosis and mild right foraminal stenosis, both  progressed from prior.   L2-L3: Interval decompression and fusion. No residual foraminal or  canal stenosis. Findings improved from prior.   L3-L4: Prior decompression and fusion. No evidence of  residual  foraminal or canal stenosis. Unchanged.   L4-L5: Prior decompression and fusion. No evidence of residual  foraminal or canal stenosis. Unchanged.   L5-S1: Transitional level. Interval fusion. No foraminal or canal  stenosis.   IMPRESSION:  1. Interval decompression and fusion at L2-L3 and L5-S1 without  evidence of residual foraminal or canal stenosis at these levels.  2. Progressive adjacent segment disease at L1-L2 with moderate canal  stenosis and mild right foraminal stenosis.  3. Transitional lumbosacral anatomy.   Electronically Signed  By: Davina Poke D.O.  On: 12/23/2019 12:54  CT L spine 04/19/2020 IMPRESSION:  1. Solid L2-S1 fusion without residual stenosis.  2. L1-2 adjacent segment disease with mild-to-moderate spinal  stenosis.  3. Aortic  Atherosclerosis (ICD10-I70.0).   Electronically Signed  By: Logan Bores M.D.  On: 04/19/2020 08:53  MRI T spine 04/19/2020 IMPRESSION:  Mild multilevel spondylosis. Minimal to mild spinal canal narrowing  at the T4-5 level. Patent neural foramen.   Chronic T11 anterior wedging deformity with 10% height loss.   Electronically Signed  By: Primitivo Gauze M.D.  On: 04/19/2020 09:28  I have personally reviewed the images and agree with the above interpretation.  Assessment and Plan: Mr. Earll is a pleasant 65 y.o. male with adjacent segment disease with spondylolisthesis at L1-2. He has moderate stenosis at L1-2.   He continues to have worsening issues. He failed conservative management.   I have recommended T10 the pelvis posterior spinal fusion with L1-2 decompression. I will attach to his prior implants, as his prior fusion is solid.   Meade Maw MD, [facility] Department of Neurosurgery

## 2020-09-14 NOTE — Progress Notes (Signed)
Pt received from PACU, s/p open T10 pelvis post spinal fusion L1-2 spinal decompression, bone marrow aspiration. Indwelling catheter in place draining clear yellow urine, Prevena woundvac in place dressing CDI, 2 hemovacs in place dressing CDI. Patient settled, report given to oncoming RN.

## 2020-09-14 NOTE — Consult Note (Signed)
Pharmacy Antibiotic Note  Howard Murphy is a 65 y.o. male admitted on 09/14/2020 for T10 the pelvis posterior spinal fusion with L1-2 decompression. Pharmacy has been consulted for vancomycin and cefazolin dosing for neurosurgery surgical prophylaxis   Allergies have been reviewed   Plan:  Cefazolin 2 gram (pt weight < 120 kg) x 1 30 mins before incision  Vancomycin 15 mg/kg (1250 mg) x 1 60 mins prior to surgical incision  Height: 5\' 7"  (170.2 cm) Weight: 80.3 kg (177 lb 0.5 oz) IBW/kg (Calculated) : 66.1  Temp (24hrs), Avg:97.5 F (36.4 C), Min:97.5 F (36.4 C), Max:97.5 F (36.4 C)  No results for input(s): WBC, CREATININE, LATICACIDVEN, VANCOTROUGH, VANCOPEAK, VANCORANDOM, GENTTROUGH, GENTPEAK, GENTRANDOM, TOBRATROUGH, TOBRAPEAK, TOBRARND, AMIKACINPEAK, AMIKACINTROU, AMIKACIN in the last 168 hours.  Estimated Creatinine Clearance: 87 mL/min (by C-G formula based on SCr of 0.86 mg/dL).    Allergies  Allergen Reactions  . Adhesive [Tape] Other (See Comments)    Honeycomb dressing, blisters     Thank you for allowing pharmacy to be a part of this patient's care.  Dorothe Pea, PharmD, BCPS Clinical Pharmacist  09/14/2020 1:43 PM

## 2020-09-14 NOTE — Transfer of Care (Signed)
Immediate Anesthesia Transfer of Care Note  Patient: Howard Murphy  Procedure(s) Performed: OPEN T10-PELVIS POSTERIOR SPINAL FUSION (HARDWARE ALREADY PRESENT AT L2-S2); L1-2 POSTERIOR SPINAL DECOMPRESSION (N/A ) BONE MARROW ASPIRATION FOR SPINE FUSION ONLY (BMAC) (N/A )  Patient Location: PACU  Anesthesia Type:General  Level of Consciousness: drowsy  Airway & Oxygen Therapy: Patient Spontanous Breathing and Patient connected to face mask oxygen  Post-op Assessment: Report given to RN and Post -op Vital signs reviewed and stable  Post vital signs: Reviewed and stable  Last Vitals:  Vitals Value Taken Time  BP 101/54 09/14/20 1625  Temp    Pulse 86 09/14/20 1628  Resp 18 09/14/20 1628  SpO2 97 % 09/14/20 1628  Vitals shown include unvalidated device data.  Last Pain:  Vitals:   09/14/20 1216  TempSrc: Oral  PainSc: 8          Complications: No complications documented.

## 2020-09-14 NOTE — Consult Note (Signed)
[facility] Medical Consultation  Howard Murphy NTZ:[id number] DOB: [birth year] DOA: 09/14/2020 PCP: [facility]   Requesting physician: Dr. Cari Caraway Date of consultation: 09/14/20 Reason for consultation: medical management of chronic conditions  Impression/Recommendations Active Problems:   Lumbar adjacent segment disease with spondylolisthesis    Spondylolisthesis of L1-L2 -s/p surgery today 4/6.  Management per neurosurgery.  Hypotension - in post-op setting.  Likely due to anesthesia.  Hold antihypertensives.  Maintain MAP >=65 with IV fluids as needed.   Hypokalemia - mild, K 3.4.  Will recheck with AM labs and replace if needed.    Type 2 Diabetes - poorly controlled.  Most recent A1c 7.4% on 08/01/20.  Home regimen is Jardiance 25 mg daily, Tradjenta 5 mg daily.  Hold oral agents for now. Sliding scale Novolog coverage for any hyperglycemia.  Essential Hypertension - home regimen is chlorthalidone 50 mg daily, lisinopril 20 mg daily.  Pt is hypotensive in post-op setting with BP 99/56.  Will hold antihypertensives for now.  As needed labetalol.  Expect we can resume home meds tomorrow.  Monitor BP.  Dyslipidemia - continue home Lipitor  Insomnia - continue home Ambien  BPH - continue home Flomax  Dizziness - continue home meclizine PRN  Chronic Pain - on chronic opiates.  Pain control per neurosurgery post-operatively.   I will followup again tomorrow. Please contact me if I can be of assistance in the meanwhile. Thank you for this consultation.  Chief Complaint: n/a  HPI:  65 year old male with past medical history of type 2 diabetes, hypertension, GERD, chronic pain due to cervical and lumbar spine issues, insomnia hyperlipidemia who was admitted by neurosurgery earlier today and underwent lumbar surgery for spondylolisthesis.  We are asked to consult for medical management of patient's chronic issues including diabetes and  hypertension.  Patient seen this afternoon following surgery.  He remains drowsy / sedated but responds briefly. Currently pain controlled.  Denies recent acute illnesses, fever, chills, CP, SOB, cough, N/V.  Review of Systems:  Limited due to pt sedated.  As in HPI.  Past Medical History:  Diagnosis Date  . 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  . Back ache 05/05/2013  . Diabetes mellitus without complication ([place])   . GERD (gastroesophageal reflux disease)   . Headache    MIGRAINES  . History of kidney stones   . Hyperlipemia   . Hypertension   . Kidney stones   . Kidney stones   . Nephrolithiasis   . Shortness of breath dyspnea    Past Surgical History:  Procedure Laterality Date  . APPENDECTOMY    . BACK SURGERY  2015   x3  . BACK SURGERY  08/09/2016  . BACK SURGERY Bilateral 2019  . CHOLECYSTECTOMY  2000  . COLONOSCOPY WITH PROPOFOL N/A 04/11/2015   Procedure: COLONOSCOPY WITH PROPOFOL;  Surgeon: Manya Silvas, MD;  Location: [facility] ENDOSCOPY;  Service: Endoscopy;  Laterality: N/A;  . CYSTOSCOPY WITH URETEROSCOPY AND STENT PLACEMENT Right 08/27/2017   Procedure: CYSTOSCOPY WITH URETEROSCOPY AND STENT PLACEMENT;  Surgeon: Abbie Sons, MD;  Location: [facility] ORS;  Service: Urology;  Laterality: Right;  . ESOPHAGOGASTRODUODENOSCOPY (EGD) WITH PROPOFOL N/A 04/11/2015   Procedure: ESOPHAGOGASTRODUODENOSCOPY (EGD) WITH PROPOFOL;  Surgeon: Manya Silvas, MD;  Location: [facility] ENDOSCOPY;  Service: Endoscopy;  Laterality: N/A;  . LAPAROSCOPIC APPENDECTOMY N/A 12/18/2017   Procedure: APPENDECTOMY LAPAROSCOPIC;  Surgeon: Florene Glen, MD;  Location: [facility] ORS;  Service: General;  Laterality: N/A;  .  SHOULDER SURGERY Right 2016   X2  . SHOULDER SURGERY Left 06/2015   Social History:  reports that he has never smoked. He quit smokeless tobacco use about 7 years ago.  His smokeless tobacco use included chew. He reports that he does not drink alcohol and does not use  drugs.  Allergies  Allergen Reactions  . Adhesive [Tape] Other (See Comments)    Honeycomb dressing, blisters   Family History  Problem Relation Age of Onset  . Cancer Mother        Breast CA  . Stroke Father   . Heart disease Father   . Cancer Sister   . Cancer Maternal Grandmother   . Cancer Sister   . Bladder Cancer Neg Hx   . Prostate cancer Neg Hx   . Kidney cancer Neg Hx     Prior to Admission medications   Medication Sig Start Date End Date Taking? Authorizing Provider  atorvastatin (LIPITOR) 40 MG tablet Take 1 tablet (40 mg total) by mouth at bedtime. 03/08/17  Yes Lada, Satira Anis, MD  chlorthalidone (HYGROTON) 50 MG tablet Take 50 mg by mouth daily.   Yes [provider]  ibuprofen (ADVIL) 800 MG tablet Take 800 mg by mouth 3 (three) times daily. 05/24/20  Yes [provider]  JANUVIA 100 MG tablet Take 100 mg by mouth daily. 05/26/20  Yes [provider]  JARDIANCE 25 MG TABS tablet Take 25 mg by mouth daily. 02/06/20  Yes [provider]  lisinopril (ZESTRIL) 20 MG tablet Take 20 mg by mouth daily. 09/18/18  Yes [provider]  meclizine (ANTIVERT) 25 MG tablet Take 25 mg by mouth every 6 (six) hours as needed for dizziness.   Yes [provider]  oxyCODONE-acetaminophen (PERCOCET) 10-325 MG tablet Take 1 tablet by mouth every 6 (six) hours as needed for pain. Must last 30 days. 08/29/20 09/28/20 Yes Milinda Pointer, MD  tamsulosin (FLOMAX) 0.4 MG CAPS capsule Take 1 capsule (0.4 mg total) by mouth daily. 11/04/18  Yes Billey Co, MD  testosterone cypionate (DEPOTESTOSTERONE CYPIONATE) 200 MG/ML injection Inject 200 mg into the muscle every 7 (seven) days. 10/07/18  Yes [provider]  zolpidem (AMBIEN) 10 MG tablet Take 10 mg by mouth at bedtime.   Yes [provider]  oxyCODONE-acetaminophen (PERCOCET) 10-325 MG tablet Take 1 tablet by mouth every 8 (eight) hours as needed for pain. Must last 30  days. 09/28/20 10/28/20  Milinda Pointer, MD  oxyCODONE-acetaminophen (PERCOCET) 10-325 MG tablet Take 1 tablet by mouth every 8 (eight) hours as needed for pain. Must last 30 days. 10/28/20 11/27/20  Milinda Pointer, MD   Physical Exam: Blood pressure (!) 99/56, pulse 97, temperature 97.8 F (36.6 C), resp. rate 17, height 5\' 7"  (1.702 m), weight 80.3 kg, SpO2 96 %. Vitals:   09/14/20 1630 09/14/20 1639  BP: (!) 99/56   Pulse: 93 97  Resp: 15 17  Temp:    SpO2: 97% 96%     General:  No acute distress, sleeping, arouses  Eyes: clear conjunctiva, pupils small and equal  ENT: moist mucus membranes, hearing grossly normal  Cardiovascular: RRR, normal S1-S2  Respiratory: CTAB without wheezes or rhonchi  Abdomen: non-tender, non-distended  Skin: dry, intact, normal temp  Musculoskeletal: grossly normal appearance  Psychiatric: normal mood, congruent affect  Neurologic: normal speech, grossly non-focal exam  Labs on Admission:  Basic Metabolic Panel: No results for input(s): NA, K, CL, CO2, GLUCOSE, BUN, CREATININE, CALCIUM, MG,  PHOS in the last 168 hours. Liver Function Tests: No results for input(s): AST, ALT, ALKPHOS, BILITOT, PROT, ALBUMIN in the last 168 hours. No results for input(s): LIPASE, AMYLASE in the last 168 hours. No results for input(s): AMMONIA in the last 168 hours. CBC: No results for input(s): WBC, NEUTROABS, HGB, HCT, MCV, PLT in the last 168 hours. Cardiac Enzymes: No results for input(s): CKTOTAL, CKMB, CKMBINDEX, TROPONINI in the last 168 hours. BNP: Invalid input(s): POCBNP CBG: Recent Labs  Lab 09/14/20 1203 09/14/20 1627  GLUCAP 149* 166*    Radiological Exams on Admission: DG Lumbar Spine 2-3 Views  Result Date: 09/14/2020 CLINICAL DATA:  Spinal surgery. EXAM: LUMBAR SPINE - 2-3 VIEW; DG C-ARM 1-60 MIN COMPARISON:  03/24/2020 FINDINGS: Intraoperative fluoroscopic images of the thoracolumbar spine were obtained. Fluoroscopy time is 4.5  seconds with a dose of 1.9 mGy. Previous surgical fusion at L2 through S1. New bilateral pedicle screws and rods at T10, T11, T12 and L1. IMPRESSION: New surgical fusion at T10 through L1. Electronically Signed   By: Markus Daft M.D.   On: 09/14/2020 15:51   DG C-Arm 1-60 Min  Result Date: 09/14/2020 CLINICAL DATA:  Spinal surgery. EXAM: LUMBAR SPINE - 2-3 VIEW; DG C-ARM 1-60 MIN COMPARISON:  03/24/2020 FINDINGS: Intraoperative fluoroscopic images of the thoracolumbar spine were obtained. Fluoroscopy time is 4.5 seconds with a dose of 1.9 mGy. Previous surgical fusion at L2 through S1. New bilateral pedicle screws and rods at T10, T11, T12 and L1. IMPRESSION: New surgical fusion at T10 through L1. Electronically Signed   By: Markus Daft M.D.   On: 09/14/2020 15:51   DG C-Arm 1-60 Min-No Report  Result Date: 09/14/2020 Fluoroscopy was utilized by the requesting physician.  No radiographic interpretation.      Time spent: 35 minutes   Ezekiel Slocumb, DO [facility]  If 7PM-7AM, please contact night-coverage [email or web address] 09/14/2020, 4:44 PM

## 2020-09-15 ENCOUNTER — Inpatient Hospital Stay: Payer: Medicare (Managed Care)

## 2020-09-15 ENCOUNTER — Encounter: Payer: Self-pay | Admitting: Neurosurgery

## 2020-09-15 DIAGNOSIS — I10 Essential (primary) hypertension: Secondary | ICD-10-CM

## 2020-09-15 LAB — BASIC METABOLIC PANEL
Anion gap: 9 (ref 5–15)
BUN: 20 mg/dL (ref 8–23)
CO2: 25 mmol/L (ref 22–32)
Calcium: 8.4 mg/dL — ABNORMAL LOW (ref 8.9–10.3)
Chloride: 102 mmol/L (ref 98–111)
Creatinine, Ser: 0.99 mg/dL (ref 0.61–1.24)
GFR, Estimated: >60 mL/min (ref >60)
Glucose, Bld: 128 mg/dL — ABNORMAL HIGH (ref 70–99)
Potassium: 4 mmol/L (ref 3.5–5.1)
Sodium: 136 mmol/L (ref 135–145)

## 2020-09-15 LAB — CBC
HCT: 45.2 % (ref 39.0–52.0)
Hemoglobin: 15.1 g/dL (ref 13.0–17.0)
MCH: 30 pg (ref 26.0–34.0)
MCHC: 33.4 g/dL (ref 30.0–36.0)
MCV: 89.9 fL (ref 80.0–100.0)
Platelets: 223 10*3/uL (ref 150–400)
RBC: 5.03 MIL/uL (ref 4.22–5.81)
RDW: 14.5 % (ref 11.5–15.5)
WBC: 15.5 10*3/uL — ABNORMAL HIGH (ref 4.0–10.5)
nRBC: 0 % (ref 0.0–0.2)

## 2020-09-15 LAB — GLUCOSE, CAPILLARY
Glucose-Capillary: 113 mg/dL — ABNORMAL HIGH (ref 70–99)
Glucose-Capillary: 129 mg/dL — ABNORMAL HIGH (ref 70–99)
Glucose-Capillary: 131 mg/dL — ABNORMAL HIGH (ref 70–99)
Glucose-Capillary: 114 mg/dL — ABNORMAL HIGH (ref 70–99)

## 2020-09-15 MED ORDER — METHOCARBAMOL 500 MG PO TABS
500.0000 mg | ORAL_TABLET | Freq: Four times a day (QID) | ORAL | Status: DC | PRN
  Administered 2020-09-15 (×2): 500 mg via ORAL
  Administered 2020-09-16 – 2020-09-17 (×3): 1000 mg via ORAL
  Filled 2020-09-15 (×2): qty 2
  Filled 2020-09-15 (×2): qty 1
  Filled 2020-09-15: qty 2

## 2020-09-15 MED ORDER — OXYCODONE HCL 5 MG PO TABS
10.0000 mg | ORAL_TABLET | ORAL | Status: DC | PRN
Start: 2020-09-15 — End: 2020-09-15
  Administered 2020-09-15 (×3): 15 mg via ORAL
  Filled 2020-09-15 (×3): qty 3

## 2020-09-15 MED ORDER — METHOCARBAMOL 1000 MG/10ML IJ SOLN
500.0000 mg | Freq: Four times a day (QID) | INTRAVENOUS | Status: DC | PRN
  Filled 2020-09-15: qty 5

## 2020-09-15 MED ORDER — HYDROMORPHONE HCL 2 MG PO TABS
4.0000 mg | ORAL_TABLET | ORAL | Status: DC | PRN
  Administered 2020-09-15 – 2020-09-17 (×10): 4 mg via ORAL
  Filled 2020-09-15 (×10): qty 2

## 2020-09-15 MED ORDER — ACETAMINOPHEN 500 MG PO TABS
1000.0000 mg | ORAL_TABLET | Freq: Four times a day (QID) | ORAL | Status: DC
  Administered 2020-09-15 – 2020-09-17 (×8): 1000 mg via ORAL
  Filled 2020-09-15 (×9): qty 2

## 2020-09-15 NOTE — Progress Notes (Signed)
PT Cancellation Note  Patient Details Name: Howard Murphy MRN: [id number] DOB: Oct 08, 1955   Cancelled Treatment:      PT attempt. Pt was standing in bathroom using urinal upon arriving. He requested not to have 2nd/PM session for PT until MD comes back to access spine. Per RN, pt does have some swelling concerns. PT will return in morning and continue to advance pt per POC.   Willette Pa 09/15/2020, 3:58 PM

## 2020-09-15 NOTE — Evaluation (Signed)
Occupational Therapy Evaluation Patient Details Name: Howard Murphy MRN: [id number] DOB: 03/22/1956 Today's Date: 09/15/2020    History of Present Illness Howard Murphy is here for adjacent segment disease with spondylolisthesis.  He underwent T10-pelvis PSF with extension from his prior L2-pelvis fusion, as well as L1-2 decompression.   Clinical Impression   Pt seen for OT evaluation this date, POD#1 from above surgery. Pt was independent in all ADLs prior to surgery, however occasionally receiving assist from fiance for LB dressing/bathing d/t back pain. Pt is eager to return to PLOF with less pain and improved safety and independence. Pt currently requires MAX assist for LB dressing while in seated position due to pain and SUPERVISION for standing grooming tasks and functional mobility of short household distances w/out AD/DME. Pt able to recall 1/3 back precautions at start of session. Pt re-educated on back precautions and how to implement during ADLs and functional mobility. At end of session, pt able to recall 3/3 back precautions. This date, pt required MIN verbal cues to adhere to precautions during functional mobility and ADLs. Pt would benefit from additional instruction in self care skills and techniques to help maintain precautions, with or without assistive devices, to support recall and carryover prior to discharge. Recommend no OT follow-up and supervision/assistance from family upon discharge.      Follow Up Recommendations  Supervision - Intermittent    Equipment Recommendations  3 in 1 bedside commode       Precautions / Restrictions Precautions Precautions: Fall;Back Precaution Booklet Issued: No Required Braces or Orthoses: Spinal Brace Spinal Brace: Thoracolumbosacral orthotic;Applied in sitting position Restrictions Weight Bearing Restrictions: No Other Position/Activity Restrictions: Per MD, able to walk to/from bathroom w/out donning brace      Mobility Bed  Mobility Overal bed mobility: Needs Assistance Bed Mobility: Sidelying to Sit;Rolling;Sit to Sidelying Rolling: Supervision Sidelying to sit: Min guard Supine to sit: Min assist Sit to supine: Supervision Sit to sidelying: Min guard General bed mobility comments: Cues for log rolling during supine>sit transfer    Transfers Overall transfer level: Needs assistance Equipment used: None Transfers: Sit to/from Stand Sit to Stand: Supervision         General transfer comment: Required increased time/effort. Supervision for line management    Balance Overall balance assessment: Needs assistance Sitting-balance support: Bilateral upper extremity supported;Feet supported Sitting balance-Leahy Scale: Good Sitting balance - Comments: Good sitting balance at EOB, however pt unable to participate in LB dressing d/t back pain   Standing balance support: No upper extremity supported;During functional activity Standing balance-Leahy Scale: Good Standing balance comment: Supervision for standing ADLs                           ADL either performed or assessed with clinical judgement   ADL Overall ADL's : Needs assistance/impaired     Grooming: Wash/dry face;Oral care;Supervision/safety;Cueing for compensatory techniques;Standing Grooming Details (indicate cue type and reason): MIN verbal cues for implementing compensatory strategies during standing grooming tasks             Lower Body Dressing: Maximal assistance;Cueing for compensatory techniques;Sitting/lateral leans Lower Body Dressing Details (indicate cue type and reason): MAX A to don/doff socks. Pt educated on figure-4 position for LB dressing, however pt unable to demonstrate position d/t back pain             Functional mobility during ADLs: Supervision/safety  Pertinent Vitals/Pain Pain Assessment: 0-10 Pain Score: 9  Pain Location: Surgical site Pain Descriptors / Indicators:  Discomfort;Sore;Operative site guarding Pain Intervention(s): Limited activity within patient's tolerance;Monitored during session;Repositioned;Relaxation     Hand Dominance     Extremity/Trunk Assessment Upper Extremity Assessment Upper Extremity Assessment: Overall WFL for tasks assessed   Lower Extremity Assessment Lower Extremity Assessment: Defer to PT evaluation   Cervical / Trunk Assessment Cervical / Trunk Assessment: Normal   Communication Communication Communication: HOH   Cognition Arousal/Alertness: Awake/alert Behavior During Therapy: WFL for tasks assessed/performed Overall Cognitive Status: Within Functional Limits for tasks assessed                                 General Comments: Pt pleasant and agreeable throughout. Pt able to recall 1/3 back precautions at beginning of session and 3/3 at end of session. MIN verbal cues to adhere to precautions during functional mobility and ADLs      Exercises Other Exercises Other Exercises: handout provided        Home Living Family/patient expects to be discharged to:: Private residence Living Arrangements: Spouse/significant other ([name])) Available Help at Discharge: Family;Available 24 hours/day Type of Home: House Home Access: Stairs to enter [employer] of Steps: 2-3 Entrance Stairs-Rails: Right;Left;Can reach both Home Layout: One level     Bathroom Shower/Tub: Teacher, early years/pre: Standard     Home Equipment: Environmental consultant - 2 wheels;Cane - single point;Wheelchair - manual          Prior Functioning/Environment Level of Independence: Needs assistance    ADL's / Homemaking Assistance Needed: Pt typically independent with ADLs, however intermittently recieving assistance for LB dressing/bathing d/t back pain. Pt is a retired Games developer and occassionally works. Pt's fiance assists with IADLs.            OT Problem List: Decreased range of motion;Decreased  activity tolerance;Impaired balance (sitting and/or standing);Decreased knowledge of precautions;Pain      OT Treatment/Interventions: Self-care/ADL training;Therapeutic exercise;Therapeutic activities;Patient/family education;Balance training;DME and/or AE instruction    OT Goals(Current goals can be found in the care plan section) Acute Rehab OT Goals Patient Stated Goal: to return home OT Goal Formulation: With patient/family Time For Goal Achievement: 09/29/20 Potential to Achieve Goals: Good ADL Goals Pt Will Perform Lower Body Dressing: with modified independence;sit to/from stand Pt Will Transfer to Toilet: with modified independence;ambulating;regular height toilet Pt Will Perform Toileting - Clothing Manipulation and hygiene: with modified independence;sitting/lateral leans  OT Frequency: Min 1X/week    AM-PAC OT "6 Clicks" Daily Activity     Outcome Measure Help from another person eating meals?: None Help from another person taking care of personal grooming?: A Little Help from another person toileting, which includes using toliet, bedpan, or urinal?: A Little Help from another person bathing (including washing, rinsing, drying)?: A Little Help from another person to put on and taking off regular upper body clothing?: A Little Help from another person to put on and taking off regular lower body clothing?: A Lot 6 Click Score: 18   End of Session Nurse Communication: Mobility status  Activity Tolerance: Patient tolerated treatment well Patient left: in bed;with call bell/phone within reach;with bed alarm set;with family/visitor present  OT Visit Diagnosis: Unsteadiness on feet (R26.81);Pain Pain - Right/Left:  (back) Pain - part of body:  (back)                Time: 3536-1443  OT Time Calculation (min): 24 min Charges:  OT General Charges $OT Visit: 1 Visit OT Evaluation $OT Eval Moderate Complexity: 1 Mod OT Treatments $Self Care/Home Management : 8-22 mins  Fredirick Maudlin, OTR/L [place]

## 2020-09-15 NOTE — Progress Notes (Addendum)
PROGRESS NOTE    Howard Murphy   ERD:[id number]  DOB: 17-Nov-1955  PCP: [facility] [place]    DOA: 09/14/2020 LOS: 1   Brief Narrative   65 year old male with past medical history of type 2 diabetes, hypertension, GERD, chronic pain due to cervical and lumbar spine issues, insomnia hyperlipidemia who was admitted by neurosurgery 09/14/20 and underwent T10-pelvis PSF with extension from his prior L2-pelvis fusion, as well as L1-2 decompression.  TRH consulted for medical management of patient's chronic conditions including diabetes and hypertension.  Assessment & Plan   Active Problems:   Essential hypertension   Type 2 diabetes mellitus (HCC)   Lumbar adjacent segment disease with spondylolisthesis   Spondylolisthesis of L1-L2 -s/p surgery today 4/6.  Management per neurosurgery.  Hypotension - in post-op setting.  Likely due to anesthesia.  Hold antihypertensives.  Maintain MAP >=65 with IV fluids as needed.   Hypokalemia - mild, K 3.4.  Will recheck with AM labs and replace if needed.    Leukocytosis - post-op day 1 wbc 15.5k, likely reactive.  Monitor for s/sx's of infection.  Monitor CBC.  Type 2 Diabetes - poorly controlled, recent A1c 7.4% on 08/01/20.  Home regimen is Jardiance 25 mg daily, Tradjenta 5 mg daily.  Hold oral agents for now. Sliding scale Novolog coverage for any hyperglycemia. --CBG's are at goal  Essential Hypertension - home regimen is chlorthalidone 50 mg daily, lisinopril 20 mg daily.  Pt is hypotensive in post-op setting with BP 99/56.  Will hold antihypertensives for now.  As needed labetalol.  Expect we can resume home meds tomorrow.  Monitor BP. --BP's low normal with meds held, continue PRN only for now  Dyslipidemia - continue home Lipitor  Insomnia - continue home Ambien  BPH - continue home Flomax  Dizziness - continue home meclizine PRN  Chronic Pain - on chronic opiates.  Pain control per neurosurgery  post-operatively.   I will followup again tomorrow. Please contact me if I can be of assistance in the meanwhile. Thank you for this consultation.   Patient BMI: Body mass index is 27.73 kg/m.   DVT prophylaxis: enoxaparin (LOVENOX) injection 40 mg Start: 09/15/20 0800 SCD's Start: 09/14/20 1844 Place TED hose Start: 09/14/20 1219   Diet:  Diet Orders (From admission, onward)    Start     Ordered   09/15/20 0803  Diet Carb Modified Fluid consistency: Thin; Room service appropriate? Yes  Diet effective now       Question Answer Comment  Diet-HS Snack? Nothing   Calorie Level Medium 1600-2000   Fluid consistency: Thin   Room service appropriate? Yes      09/15/20 0802            Code Status: Full Code    Subjective 09/15/20    POD 1 - Pt seen this AM. Reports uncontrolled pain, has not been below an 8 or 9 in severity despite medications.  No acute acute complaints.    Disposition Plan & Communication   Status is: Inpatient  Inpatient status as per primary team, appropriate due to severity of illness requiring IV medications, is post-op day 1 from major spine surgery.  Dispo: The patient is from: home              Anticipated d/c is to: home   Difficult to place patient no   Consults, Procedures, Significant Events   Consultants:   Hospitalist service, Neurosurgery is primary  Procedures:  Surgery 4/6 - T10-pelvis PSF with extension from his prior L2-pelvis fusion, as well as L1-2 decompression  Antimicrobials:  Anti-infectives (From admission, onward)   Start     Dose/Rate Route Frequency Ordered Stop   09/14/20 1300  vancomycin (VANCOCIN) IVPB 1000 mg/200 mL premix  Status:  Discontinued        1,000 mg 200 mL/hr over 60 Minutes Intravenous  Once 09/14/20 1237 09/14/20 1815   09/14/20 1245  ceFAZolin (ANCEF) IVPB 2g/100 mL premix        2 g 200 mL/hr over 30 Minutes Intravenous  Once 09/14/20 1231 09/14/20 1310   09/14/20 1245  vancomycin  (VANCOREADY) IVPB 1250 mg/250 mL  Status:  Discontinued        1,250 mg 166.7 mL/hr over 90 Minutes Intravenous  Once 09/14/20 1231 09/14/20 1237   09/14/20 1225  vancomycin (VANCOCIN) 1-5 GM/200ML-% IVPB       Note to Pharmacy: [facility]   : cabinet override      09/14/20 1225 09/14/20 1230   09/14/20 1225  ceFAZolin (ANCEF) 2-4 GM/100ML-% IVPB       Note to Pharmacy: [facility]   : cabinet override      09/14/20 1225 09/14/20 1315        Micro    Objective   Vitals:   09/15/20 0755 09/15/20 1026 09/15/20 1204 09/15/20 1557  BP: 117/76  103/63 127/68  Pulse: 88  96 (!) 108  Resp: 16  16 18   Temp: 98.7 F (37.1 C)  97.8 F (36.6 C) 97.7 F (36.5 C)  TempSrc:      SpO2: 98% 94% 94% 99%  Weight:      Height:        Intake/Output Summary (Last 24 hours) at 09/15/2020 1613 Last data filed at 09/15/2020 1500 Gross per 24 hour  Intake 1938.89 ml  Output 2773 ml  Net -834.11 ml   Filed Weights   09/14/20 1216  Weight: 80.3 kg    Physical Exam:  General exam: awake, alert, appears uncomfortable due to pain HEENT: moist mucus membranes, hearing grossly normal  Respiratory system: CTAB, normal respiratory effort, on room air. Cardiovascular system: normal S1/S2, RRR, no JVD, murmurs, rubs, gallops, no pedal edema.   Gastrointestinal system: soft, NT, ND,  Hypoactive bowel sounds. Central nervous system: A&O x3. no gross focal neurologic deficits, normal speech   Labs   Data Reviewed: I have personally reviewed following labs and imaging studies  CBC: Recent Labs  Lab 09/15/20 0936  WBC 15.5*  HGB 15.1  HCT 45.2  MCV 89.9  PLT 161   Basic Metabolic Panel: Recent Labs  Lab 09/15/20 0936  NA 136  K 4.0  CL 102  CO2 25  GLUCOSE 128*  BUN 20  CREATININE 0.99  CALCIUM 8.4*   GFR: Estimated Creatinine Clearance: 75.5 mL/min (by C-G formula based on SCr of 0.99 mg/dL). Liver Function Tests: No results for input(s): AST, ALT, ALKPHOS, BILITOT,  PROT, ALBUMIN in the last 168 hours. No results for input(s): LIPASE, AMYLASE in the last 168 hours. No results for input(s): AMMONIA in the last 168 hours. Coagulation Profile: No results for input(s): INR, PROTIME in the last 168 hours. Cardiac Enzymes: No results for input(s): CKTOTAL, CKMB, CKMBINDEX, TROPONINI in the last 168 hours. BNP (last 3 results) No results for input(s): PROBNP in the last 8760 hours. HbA1C: No results for input(s): HGBA1C in the last 72 hours. CBG: Recent Labs  Lab 09/14/20 1627 09/14/20  2025 09/15/20 0758 09/15/20 1206 09/15/20 1602  GLUCAP 166* 147* 113* 129* 131*   Lipid Profile: No results for input(s): CHOL, HDL, LDLCALC, TRIG, CHOLHDL, LDLDIRECT in the last 72 hours. Thyroid Function Tests: No results for input(s): TSH, T4TOTAL, FREET4, T3FREE, THYROIDAB in the last 72 hours. Anemia Panel: No results for input(s): VITAMINB12, FOLATE, FERRITIN, TIBC, IRON, RETICCTPCT in the last 72 hours. Sepsis Labs: No results for input(s): PROCALCITON, LATICACIDVEN in the last 168 hours.  Recent Results (from the past 240 hour(s))  SARS CORONAVIRUS 2 (TAT 6-24 HRS) Nasopharyngeal Nasopharyngeal Swab     Status: None   Collection Time: 09/12/20  8:30 AM   Specimen: Nasopharyngeal Swab  Result Value Ref Range Status   SARS Coronavirus 2 NEGATIVE NEGATIVE Final    Comment: (NOTE) SARS-CoV-2 target nucleic acids are NOT DETECTED.  The SARS-CoV-2 RNA is generally detectable in upper and lower respiratory specimens during the acute phase of infection. Negative results do not preclude SARS-CoV-2 infection, do not rule out co-infections with other pathogens, and should not be used as the sole basis for treatment or other patient management decisions. Negative results must be combined with clinical observations, patient history, and epidemiological information. The expected result is Negative.  Fact Sheet for  Patients: SugarRoll.be  Fact Sheet for Healthcare Providers: https://www.woods-mathews.com/  This test is not yet approved or cleared by the [place] FDA and  has been authorized for detection and/or diagnosis of SARS-CoV-2 by FDA under an Emergency Use Authorization (EUA). This EUA will remain  in effect (meaning this test can be used) for the duration of the COVID-19 declaration under Se ction 564(b)(1) of the Act, 21 U.S.C. section 360bbb-3(b)(1), unless the authorization is terminated or revoked sooner.  Performed at [facility], [place] [address], [place], [place] [address]       Imaging Studies   DG Lumbar Spine 2-3 Views  Result Date: 09/15/2020 CLINICAL DATA:  Lumbar fusion EXAM: LUMBAR SPINE - 2-3 VIEW COMPARISON:  03/24/2020 FINDINGS: Bilateral pedicle screw fusion extending from T10 through the iliac bone bilaterally. Previously there is fusion L2 through the iliac bone which is unchanged. New fusion extends from T10 through L2. Hardware in satisfactory position. Interbody fusion L2-3, L3-4, L4-5. Extensive disc degeneration at L1-2.  Negative for fracture. IMPRESSION: Extension of previous fusion which now extends from T10 through the iliac bone. Hardware in satisfactory position. Electronically Signed   By: Franchot Gallo M.D.   On: 09/15/2020 15:05   DG Lumbar Spine 2-3 Views  Result Date: 09/14/2020 CLINICAL DATA:  Spinal surgery. EXAM: LUMBAR SPINE - 2-3 VIEW; DG C-ARM 1-60 MIN COMPARISON:  03/24/2020 FINDINGS: Intraoperative fluoroscopic images of the thoracolumbar spine were obtained. Fluoroscopy time is 4.5 seconds with a dose of 1.9 mGy. Previous surgical fusion at L2 through S1. New bilateral pedicle screws and rods at T10, T11, T12 and L1. IMPRESSION: New surgical fusion at T10 through L1. Electronically Signed   By: Markus Daft M.D.   On: 09/14/2020 15:51   DG C-Arm 1-60 Min  Result Date: 09/14/2020 CLINICAL DATA:   Spinal surgery. EXAM: LUMBAR SPINE - 2-3 VIEW; DG C-ARM 1-60 MIN COMPARISON:  03/24/2020 FINDINGS: Intraoperative fluoroscopic images of the thoracolumbar spine were obtained. Fluoroscopy time is 4.5 seconds with a dose of 1.9 mGy. Previous surgical fusion at L2 through S1. New bilateral pedicle screws and rods at T10, T11, T12 and L1. IMPRESSION: New surgical fusion at T10 through L1. Electronically Signed   By: Quita Skye  Anselm Pancoast M.D.   On: 09/14/2020 15:51   DG C-Arm 1-60 Min-No Report  Result Date: 09/14/2020 Fluoroscopy was utilized by the requesting physician.  No radiographic interpretation.     Medications   Scheduled Meds: . acetaminophen  1,000 mg Oral Q6H  . atorvastatin  40 mg Oral QHS  . celecoxib  200 mg Oral Q12H  . chlorthalidone  50 mg Oral Daily  . empagliflozin  25 mg Oral Daily  . enoxaparin (LOVENOX) injection  40 mg Subcutaneous Q24H  . gabapentin  300 mg Oral TID  . insulin aspart  0-5 Units Subcutaneous QHS  . insulin aspart  0-9 Units Subcutaneous TID WC  . linagliptin  5 mg Oral Daily  . lisinopril  20 mg Oral Daily  . senna-docusate  1 tablet Oral BID  . sodium chloride flush  3 mL Intravenous Q12H  . tamsulosin  0.4 mg Oral Daily  . testosterone cypionate  200 mg Intramuscular Q7 days  . zolpidem  5 mg Oral QHS   Continuous Infusions: . sodium chloride 10 mL/hr at 09/14/20 1721  . sodium chloride    . 0.9 % NaCl with KCl 20 mEq / L 100 mL/hr at 09/15/20 0651  . methocarbamol (ROBAXIN) IV         LOS: 1 day    Time spent: 25 minutes with > 50% spent at bedside and in coordination of care.    Ezekiel Slocumb, DO [facility]  09/15/2020, 4:13 PM      If 7PM-7AM, please contact night-coverage. How to contact the [facility] Attending or Consulting provider [place] or covering provider during after hours [place], for this patient?    1. Check the care team in [facility] and look for a) attending/consulting [facility] provider listed and b) the [facility] team  listed 2. Log into [email or web address] and use [place]'s universal password to access. If you do not have the password, please contact the hospital operator. 3. Locate the [facility] provider you are looking for under [facility] and page to a number that you can be directly reached. 4. If you still have difficulty reaching the provider, please page the [facility] (Director on Call) for the Hospitalists listed on amion for assistance.

## 2020-09-15 NOTE — Progress Notes (Addendum)
    Attending Progress Note  History: Howard Murphy is here for adjacent segment disease with spondylolisthesis.  He underwent T10-pelvis PSF with extension from his prior L2-pelvis fusion, as well as L1-2 decompression.  POD1: Expected pain overnight.  RLE pain is improved.    Physical Exam: Vitals:   09/15/20 0047 09/15/20 0419  BP: 109/77 114/75  Pulse: 94 88  Resp: 17 18  Temp: 98.3 F (36.8 C) 98.3 F (36.8 C)  SpO2: 96% 97%    AA Ox3 CNI  Strength:5/5 throughout BLE SILT Dressing intact  Data:  No results for input(s): NA, K, CL, CO2, BUN, CREATININE, LABGLOM, GLUCOSE, CALCIUM in the last 168 hours. No results for input(s): AST, ALT, ALKPHOS in the last 168 hours.  Invalid input(s): TBILI   No results for input(s): WBC, HGB, HCT, PLT in the last 168 hours. No results for input(s): APTT, INR in the last 168 hours.       Other tests/results: films pending  Assessment/Plan:  Howard Murphy is doing well after extension of fusion to T10 with L1-2 decompression.  - mobilize - pain control - change to oral meds only - DVT prophylaxis - PTOT - Appreciate medicine consultation for diabetes management. Defer to Dr. Arbutus Ped and partners regarding ongoing diabetic management.  Blood sugars currently well controlled.  Meade Maw MD, [place] Department of Neurosurgery

## 2020-09-15 NOTE — Progress Notes (Signed)
Note written for [employer]. Patient did not ambulate on POD #1 due to patient's pain but did dangle on the side of the bed and stood up x1.

## 2020-09-15 NOTE — Progress Notes (Signed)
Met with the patient and his S/O in the room, He stated that he has a Wheelchair, RW and cane at home and does not need additional DME He does not want or need [place] services, he stated if he needs PT he will go outpatient as he has before, he has a PCP and can get his medications with no problem,  NO additional needs

## 2020-09-15 NOTE — Progress Notes (Signed)
Orthopedic Tech Progress Note Patient Details:  Howard Murphy [birth year] [id number] Ordered brace Patient ID: Howard Murphy, male   DOB: 1955-12-29, 66 y.o.   MRN: [id number]   Ellouise Newer 09/15/2020, 8:35 AM

## 2020-09-15 NOTE — Evaluation (Signed)
Physical Therapy Evaluation Patient Details Name: Howard Murphy MRN: [id number] DOB: June 03, 1956 Today's Date: 09/15/2020   History of Present Illness  Howard Murphy is here for adjacent segment disease with spondylolisthesis.  He underwent T10-pelvis PSF with extension from his prior L2-pelvis fusion, as well as L1-2 decompression.  Clinical Impression  Patient received in bed, reports he got up and stood for a little bit. He is agreeable to PT assessment, reports 9/10 pain in back. Requires cues for log rolling. Min assist needed for supine to sit. Performed sit to stand with supervision and ambulated 400 feet 150 feet holding to IV pole, rest of ambulation without UE support. Patient is demonstrating good functional mobility, supportive family. Assist for donning brace needed. Patient has had multiple surgeries so is knowledgeable about precautions and brace wear. Patient will continue to benefit from skilled PT while here to improve mobility and strength.        Follow Up Recommendations Follow surgeon's recommendation for DC plan and follow-up therapies    Equipment Recommendations  3in1 (PT)    Recommendations for Other Services       Precautions / Restrictions Precautions Precautions: Fall;Back Precaution Booklet Issued: No Required Braces or Orthoses: Spinal Brace Restrictions Weight Bearing Restrictions: No      Mobility  Bed Mobility Overal bed mobility: Needs Assistance Bed Mobility: Supine to Sit;Sit to Supine     Supine to sit: Min assist Sit to supine: Supervision   General bed mobility comments: Cues for log rolling, difficult for him to do with drains, wound vac    Transfers Overall transfer level: Modified independent Equipment used: None                Ambulation/Gait Ambulation/Gait assistance: Min guard Gait Distance (Feet): 400 Feet Assistive device: IV Pole;None Gait Pattern/deviations: Step-through pattern Gait velocity: WNL   General  Gait Details: Ambulated 1 lap around nurses station with IV pole, then 1 lap without any AD. Steady, no significant limitations  Stairs            Wheelchair Mobility    Modified Rankin (Stroke Patients Only)       Balance Overall balance assessment: Needs assistance Sitting-balance support: Feet supported Sitting balance-Leahy Scale: Normal     Standing balance support: No upper extremity supported;During functional activity Standing balance-Leahy Scale: Good Standing balance comment: supervision to min guard for safety                             Pertinent Vitals/Pain Pain Assessment: 0-10 Pain Score: 9  Pain Descriptors / Indicators: Discomfort;Sore Pain Intervention(s): Limited activity within patient's tolerance;Monitored during session;Premedicated before session    [place] expects to be discharged to:: Private residence Living Arrangements: Spouse/significant other Available Help at Discharge: Family;Available 24 hours/day Type of Home: House Home Access: Stairs to enter Entrance Stairs-Rails: Right;Left;Can reach both Entrance Stairs-Number of Steps: 2-3 Home Layout: One level Home Equipment: Walker - 2 wheels;Cane - single point;Wheelchair - manual      Prior Function Level of Independence: Independent               Hand Dominance        Extremity/Trunk Assessment   Upper Extremity Assessment Upper Extremity Assessment: Overall WFL for tasks assessed    Lower Extremity Assessment Lower Extremity Assessment: Overall WFL for tasks assessed    Cervical / Trunk Assessment Cervical / Trunk Assessment: Normal  Communication  Communication: HOH  Cognition Arousal/Alertness: Awake/alert Behavior During Therapy: WFL for tasks assessed/performed Overall Cognitive Status: Within Functional Limits for tasks assessed                                        General Comments      Exercises      Assessment/Plan    PT Assessment Patient needs continued PT services  PT Problem List Decreased mobility;Decreased activity tolerance;Pain       PT Treatment Interventions Gait training;Stair training;Functional mobility training;Therapeutic activities;Patient/family education    PT Goals (Current goals can be found in the Care Plan section)  Acute Rehab PT Goals Patient Stated Goal: to return home PT Goal Formulation: With patient/family Time For Goal Achievement: 09/19/20 Potential to Achieve Goals: Good    Frequency BID   Barriers to discharge        Co-evaluation               AM-PAC PT "6 Clicks" Mobility  Outcome Measure Help needed turning from your back to your side while in a flat bed without using bedrails?: A Lot Help needed moving from lying on your back to sitting on the side of a flat bed without using bedrails?: A Little Help needed moving to and from a bed to a chair (including a wheelchair)?: A Little Help needed standing up from a chair using your arms (e.g., wheelchair or bedside chair)?: A Little Help needed to walk in hospital room?: A Little Help needed climbing 3-5 steps with a railing? : A Little 6 Click Score: 17    End of Session   Activity Tolerance: Patient tolerated treatment well Patient left: in bed;with call bell/phone within reach;with family/visitor present Nurse Communication: Mobility status PT Visit Diagnosis: Other abnormalities of gait and mobility (R26.89);Pain;History of falling (Z91.81) Pain - part of body:  (back)    Time: 9604-5409 PT Time Calculation (min) (ACUTE ONLY): 35 min   Charges:   PT Evaluation $PT Eval Moderate Complexity: 1 Mod PT Treatments $Gait Training: 8-22 mins        Sylar Voong, PT, GCS 09/15/20,10:41 AM

## 2020-09-16 LAB — CBC
HCT: 41.6 % (ref 39.0–52.0)
Hemoglobin: 13.9 g/dL (ref 13.0–17.0)
MCH: 30.3 pg (ref 26.0–34.0)
MCHC: 33.4 g/dL (ref 30.0–36.0)
MCV: 90.6 fL (ref 80.0–100.0)
Platelets: 206 10*3/uL (ref 150–400)
RBC: 4.59 MIL/uL (ref 4.22–5.81)
RDW: 14.3 % (ref 11.5–15.5)
WBC: 16.2 10*3/uL — ABNORMAL HIGH (ref 4.0–10.5)
nRBC: 0 % (ref 0.0–0.2)

## 2020-09-16 LAB — GLUCOSE, CAPILLARY
Glucose-Capillary: 111 mg/dL — ABNORMAL HIGH (ref 70–99)
Glucose-Capillary: 164 mg/dL — ABNORMAL HIGH (ref 70–99)
Glucose-Capillary: 115 mg/dL — ABNORMAL HIGH (ref 70–99)
Glucose-Capillary: 146 mg/dL — ABNORMAL HIGH (ref 70–99)

## 2020-09-16 NOTE — Progress Notes (Signed)
PROGRESS NOTE    Howard Murphy   QJJ:[id number]  DOB: 09-May-1956  PCP: [facility] [place]    DOA: 09/14/2020 LOS: 2   Brief Narrative   65 year old male with past medical history of type 2 diabetes, hypertension, GERD, chronic pain due to cervical and lumbar spine issues, insomnia hyperlipidemia who was admitted by neurosurgery 09/14/20 and underwent T10-pelvis PSF with extension from his prior L2-pelvis fusion, as well as L1-2 decompression.  TRH consulted for medical management of patient's chronic conditions including diabetes and hypertension.  Assessment & Plan   Active Problems:   Essential hypertension   Type 2 diabetes mellitus (HCC)   Lumbar adjacent segment disease with spondylolisthesis   Spondylolisthesis of L1-L2 -s/p surgery today 4/6.  Management per neurosurgery.  Hypotension - in post-op setting.  Likely due to anesthesia.  Hold antihypertensives.  Maintain MAP >=65 with IV fluids as needed.   Hypokalemia - mild, K 3.4.  Will recheck with AM labs and replace if needed.    Leukocytosis - post-op day 1 wbc 15.5k, likely reactive.  Monitor for s/sx's of infection.  Monitor CBC.  Type 2 Diabetes - poorly controlled, recent A1c 7.4% on 08/01/20.  Home regimen is Jardiance 25 mg daily, Tradjenta 5 mg daily.  Hold oral agents for now. Sliding scale Novolog coverage for any hyperglycemia. --CBG's are at goal  Essential Hypertension - home regimen is chlorthalidone 50 mg daily, lisinopril 20 mg daily.  Pt was hypotensive in post-op setting with BP 99/56.   --BP's remain soft, hypotensive earlier this AM --Continue holding antihypertensives  Dyslipidemia - continue home Lipitor  Insomnia - continue home Ambien  BPH - continue home Flomax  Dizziness - continue home meclizine PRN  Chronic Pain - on chronic opiates.  Pain control per neurosurgery post-operatively.   I will followup again tomorrow. Please contact me if I can be of assistance in  the meanwhile. Thank you for this consultation.   Patient BMI: Body mass index is 27.73 kg/m.   DVT prophylaxis: enoxaparin (LOVENOX) injection 40 mg Start: 09/15/20 0800 SCD's Start: 09/14/20 1844 Place TED hose Start: 09/14/20 1219   Diet:  Diet Orders (From admission, onward)    Start     Ordered   09/15/20 0803  Diet Carb Modified Fluid consistency: Thin; Room service appropriate? Yes  Diet effective now       Question Answer Comment  Diet-HS Snack? Nothing   Calorie Level Medium 1600-2000   Fluid consistency: Thin   Room service appropriate? Yes      09/15/20 0802            Code Status: Full Code    Subjective 09/16/20    POD 2 - Pt seen this AM, wife at bedside. Still having significant pain, but somewhat improved with change to oral Dilaudid.  No other acute acute complaints.    Disposition Plan & Communication   Status is: Inpatient  Inpatient status as per primary team, appropriate due to severity of illness requiring IV medications, is post-op day 1 from major spine surgery.  Dispo: The patient is from: home              Anticipated d/c is to: home   Difficult to place patient no   Consults, Procedures, Significant Events   Consultants:   Hospitalist service, Neurosurgery is primary  Procedures:   Surgery 4/6 - T10-pelvis PSF with extension from his prior L2-pelvis fusion, as well as L1-2 decompression  Antimicrobials:  Anti-infectives (From admission, onward)   Start     Dose/Rate Route Frequency Ordered Stop   09/14/20 1300  vancomycin (VANCOCIN) IVPB 1000 mg/200 mL premix  Status:  Discontinued        1,000 mg 200 mL/hr over 60 Minutes Intravenous  Once 09/14/20 1237 09/14/20 1815   09/14/20 1245  ceFAZolin (ANCEF) IVPB 2g/100 mL premix        2 g 200 mL/hr over 30 Minutes Intravenous  Once 09/14/20 1231 09/14/20 1310   09/14/20 1245  vancomycin (VANCOREADY) IVPB 1250 mg/250 mL  Status:  Discontinued        1,250 mg 166.7 mL/hr over 90  Minutes Intravenous  Once 09/14/20 1231 09/14/20 1237   09/14/20 1225  vancomycin (VANCOCIN) 1-5 GM/200ML-% IVPB       Note to Pharmacy: Rivka Spring   : cabinet override      09/14/20 1225 09/14/20 1230   09/14/20 1225  ceFAZolin (ANCEF) 2-4 GM/100ML-% IVPB       Note to Pharmacy: Rivka Spring   : cabinet override      09/14/20 1225 09/14/20 1315        Micro    Objective   Vitals:   09/16/20 0900 09/16/20 0929 09/16/20 1220 09/16/20 1602  BP:  119/77 104/85 100/65  Pulse:  95 92 93  Resp:  16 16 16   Temp: 98.2 F (36.8 C) 98.2 F (36.8 C) 98.4 F (36.9 C) 98 F (36.7 C)  TempSrc:  Oral    SpO2:  91% 93% 91%  Weight:      Height:        Intake/Output Summary (Last 24 hours) at 09/16/2020 1730 Last data filed at 09/16/2020 1353 Gross per 24 hour  Intake 240 ml  Output 880 ml  Net -640 ml   Filed Weights   09/14/20 1216  Weight: 80.3 kg    Physical Exam:  General exam: awake, alert, appears uncomfortable due to pain  Respiratory system: CTAB, normal respiratory effort, on room air. Cardiovascular system: normal S1/S2, RRR, no JVD, murmurs, rubs, gallops, no pedal edema.   Central nervous system: A&O x3. no gross focal neurologic deficits, normal speech   Labs   Data Reviewed: I have personally reviewed following labs and imaging studies  CBC: Recent Labs  Lab 09/15/20 0936 09/16/20 0503  WBC 15.5* 16.2*  HGB 15.1 13.9  HCT 45.2 41.6  MCV 89.9 90.6  PLT 223 557   Basic Metabolic Panel: Recent Labs  Lab 09/15/20 0936  NA 136  K 4.0  CL 102  CO2 25  GLUCOSE 128*  BUN 20  CREATININE 0.99  CALCIUM 8.4*   GFR: Estimated Creatinine Clearance: 75.5 mL/min (by C-G formula based on SCr of 0.99 mg/dL). Liver Function Tests: No results for input(s): AST, ALT, ALKPHOS, BILITOT, PROT, ALBUMIN in the last 168 hours. No results for input(s): LIPASE, AMYLASE in the last 168 hours. No results for input(s): AMMONIA in the last 168  hours. Coagulation Profile: No results for input(s): INR, PROTIME in the last 168 hours. Cardiac Enzymes: No results for input(s): CKTOTAL, CKMB, CKMBINDEX, TROPONINI in the last 168 hours. BNP (last 3 results) No results for input(s): PROBNP in the last 8760 hours. HbA1C: No results for input(s): HGBA1C in the last 72 hours. CBG: Recent Labs  Lab 09/15/20 1602 09/15/20 2051 09/16/20 0731 09/16/20 1221 09/16/20 1634  GLUCAP 131* 114* 111* 164* 115*   Lipid Profile: No results for input(s): CHOL, HDL, LDLCALC, TRIG, CHOLHDL,  LDLDIRECT in the last 72 hours. Thyroid Function Tests: No results for input(s): TSH, T4TOTAL, FREET4, T3FREE, THYROIDAB in the last 72 hours. Anemia Panel: No results for input(s): VITAMINB12, FOLATE, FERRITIN, TIBC, IRON, RETICCTPCT in the last 72 hours. Sepsis Labs: No results for input(s): PROCALCITON, LATICACIDVEN in the last 168 hours.  Recent Results (from the past 240 hour(s))  SARS CORONAVIRUS 2 (TAT 6-24 HRS) Nasopharyngeal Nasopharyngeal Swab     Status: None   Collection Time: 09/12/20  8:30 AM   Specimen: Nasopharyngeal Swab  Result Value Ref Range Status   SARS Coronavirus 2 NEGATIVE NEGATIVE Final    Comment: (NOTE) SARS-CoV-2 target nucleic acids are NOT DETECTED.  The SARS-CoV-2 RNA is generally detectable in upper and lower respiratory specimens during the acute phase of infection. Negative results do not preclude SARS-CoV-2 infection, do not rule out co-infections with other pathogens, and should not be used as the sole basis for treatment or other patient management decisions. Negative results must be combined with clinical observations, patient history, and epidemiological information. The expected result is Negative.  Fact Sheet for Patients: SugarRoll.be  Fact Sheet for Healthcare Providers: https://www.woods-mathews.com/  This test is not yet approved or cleared by the [place]  FDA and  has been authorized for detection and/or diagnosis of SARS-CoV-2 by FDA under an Emergency Use Authorization (EUA). This EUA will remain  in effect (meaning this test can be used) for the duration of the COVID-19 declaration under Se ction 564(b)(1) of the Act, 21 U.S.C. section 360bbb-3(b)(1), unless the authorization is terminated or revoked sooner.  Performed at [facility], [place] [address] [place], [place] [address]       Imaging Studies   DG Lumbar Spine 2-3 Views  Result Date: 09/15/2020 CLINICAL DATA:  Lumbar fusion EXAM: LUMBAR SPINE - 2-3 VIEW COMPARISON:  03/24/2020 FINDINGS: Bilateral pedicle screw fusion extending from T10 through the iliac bone bilaterally. Previously there is fusion L2 through the iliac bone which is unchanged. New fusion extends from T10 through L2. Hardware in satisfactory position. Interbody fusion L2-3, L3-4, L4-5. Extensive disc degeneration at L1-2.  Negative for fracture. IMPRESSION: Extension of previous fusion which now extends from T10 through the iliac bone. Hardware in satisfactory position. Electronically Signed   By: Franchot Gallo M.D.   On: 09/15/2020 15:05     Medications   Scheduled Meds: . acetaminophen  1,000 mg Oral Q6H  . atorvastatin  40 mg Oral QHS  . celecoxib  200 mg Oral Q12H  . chlorthalidone  50 mg Oral Daily  . empagliflozin  25 mg Oral Daily  . enoxaparin (LOVENOX) injection  40 mg Subcutaneous Q24H  . gabapentin  300 mg Oral TID  . insulin aspart  0-5 Units Subcutaneous QHS  . insulin aspart  0-9 Units Subcutaneous TID WC  . linagliptin  5 mg Oral Daily  . lisinopril  20 mg Oral Daily  . senna-docusate  1 tablet Oral BID  . sodium chloride flush  3 mL Intravenous Q12H  . tamsulosin  0.4 mg Oral Daily  . testosterone cypionate  200 mg Intramuscular Q7 days  . zolpidem  5 mg Oral QHS   Continuous Infusions: . sodium chloride 10 mL/hr at 09/14/20 1721  . sodium chloride    . 0.9 % NaCl with KCl 20  mEq / L 100 mL/hr at 09/15/20 0651  . methocarbamol (ROBAXIN) IV         LOS: 2 days    Time spent:  20 minutes    Ezekiel Slocumb, DO [facility]  09/16/2020, 5:30 PM      If 7PM-7AM, please contact night-coverage. How to contact the [facility] Attending or Consulting provider [place] or covering provider during after hours [place], for this patient?    1. Check the care team in [facility] and look for a) attending/consulting TRH provider listed and b) the [facility] team listed 2. Log into [email or web address] and use [place]'s universal password to access. If you do not have the password, please contact the hospital operator. 3. Locate the [facility] provider you are looking for under [facility] and page to a number that you can be directly reached. 4. If you still have difficulty reaching the provider, please page the [facility] (Director on Call) for the Hospitalists listed on amion for assistance.

## 2020-09-16 NOTE — Progress Notes (Signed)
Secure Chat message sent to Dr. Izora Ribas:  "Pt has a swollen area around his incision near a Hemovac site that is extremely sensitive to touch. Prevena wound vac also has an air leak that is not resolved by reinforcing dressing. Is anyone available to come take a look at it? He is also still complaining of pain 12/10 with the current medications. Thank you"  Dr. Izora Ribas arrived at bedside and assessed pts incision and wound vac. Pain medications adjusted and administered. RN will continue to monitor and update MD as necessary.

## 2020-09-16 NOTE — Care Management Important Message (Signed)
Important Message  Patient Details  Name: Howard Murphy MRN: [id number] Date of Birth: [birth year]   Medicare Important Message Given:  N/A - LOS <3 / Initial given by admissions  Initial Medicare IM reviewed with patient by Malachy Moan, Patient Access Associate on 09/15/2020 at 11:15am.   Dannette Barbara 09/16/2020, 8:36 AM

## 2020-09-16 NOTE — Progress Notes (Signed)
R drain removed.

## 2020-09-16 NOTE — Progress Notes (Signed)
    Attending Progress Note  History: SAHID BORBA is here for adjacent segment disease with spondylolisthesis.  He underwent T10-pelvis PSF with extension from his prior L2-pelvis fusion, as well as L1-2 decompression.  POD2: Much better pain control overnight after switching to hydromorphone.  Has begun slowly mobilizing.  POD1: Expected pain overnight.  RLE pain is improved.    Physical Exam: Vitals:   09/15/20 2019 09/16/20 0449  BP: 113/78 (!) 87/66  Pulse: 99 85  Resp: 18 18  Temp: 98.1 F (36.7 C) 98.7 F (37.1 C)  SpO2: 93% 92%    AA Ox3 CNI  Strength:5/5 throughout BLE SILT Dressing intact  Drains 80 and 190  Data:  Recent Labs  Lab 09/15/20 0936  NA 136  K 4.0  CL 102  CO2 25  BUN 20  CREATININE 0.99  GLUCOSE 128*  CALCIUM 8.4*   No results for input(s): AST, ALT, ALKPHOS in the last 168 hours.  Invalid input(s): TBILI   Recent Labs  Lab 09/15/20 0936 09/16/20 0503  WBC 15.5* 16.2*  HGB 15.1 13.9  HCT 45.2 41.6  PLT 223 206   No results for input(s): APTT, INR in the last 168 hours.       Other tests/results: films reviewed - good placement of implants and alignment  Assessment/Plan:  [name] is doing well after extension of fusion to T10 with L1-2 decompression.  - mobilize - pain control - continue current regimen - DVT prophylaxis - PTOT - Appreciate medicine consultation for diabetes management. Defer to Dr. Arbutus Ped and partners regarding ongoing diabetic management.  Blood sugars currently well controlled. - monitor drain output and name as L and R.  Will try to remove one drain this PM.  Meade Maw MD, [facility] Department of Neurosurgery

## 2020-09-16 NOTE — Progress Notes (Signed)
Physical Therapy Treatment Patient Details Name: Howard Murphy MRN: [id number] DOB: 27-Sep-1955 Today's Date: 09/16/2020    History of Present Illness Howard Murphy is here for adjacent segment disease with spondylolisthesis.  He underwent T10-pelvis PSF with extension from his prior L2-pelvis fusion, as well as L1-2 decompression.    PT Comments    Pt was supine in bed upon arriving. Easily able to exit L side of bed, stand, and ambulate without assistive device without safety concern. Was able to safely ascend/descend 4 stair without assistance. Overall pt is progressing extremely well with PT. Recommend outpatient PT is pt agreeable.     Follow Up Recommendations  Follow surgeon's recommendation for DC plan and follow-up therapies;Outpatient PT     Equipment Recommendations  3in1 (PT)       Precautions / Restrictions Precautions Precautions: Fall;Back Precaution Booklet Issued: No Required Braces or Orthoses: Spinal Brace Spinal Brace: Thoracolumbosacral orthotic;Applied in sitting position Restrictions Weight Bearing Restrictions: No    Mobility  Bed Mobility Overal bed mobility: Needs Assistance Bed Mobility: Sidelying to Sit;Rolling;Sit to Sidelying Rolling: Supervision Sidelying to sit: Supervision Supine to sit: Supervision Sit to supine: Supervision Sit to sidelying: Supervision      Transfers Overall transfer level: Needs assistance Equipment used: None Transfers: Sit to/from Stand Sit to Stand: Supervision            Ambulation/Gait Ambulation/Gait assistance: Supervision Gait Distance (Feet): 400 Feet Assistive device: None Gait Pattern/deviations: Step-through pattern Gait velocity: WNL   General Gait Details: pt ambulated 2 laps around unit and performed stairs without difficulty or safety concerns   Stairs Stairs: Yes Stairs assistance: Supervision Stair Management: Two rails;Alternating pattern Number of Stairs: 4 General stair  comments: safely able to ascend/descend stairs       Balance Overall balance assessment: Needs assistance Sitting-balance support: Bilateral upper extremity supported;Feet supported Sitting balance-Leahy Scale: Good     Standing balance support: During functional activity;No upper extremity supported Standing balance-Leahy Scale: Good         Cognition Arousal/Alertness: Awake/alert Behavior During Therapy: WFL for tasks assessed/performed Overall Cognitive Status: Within Functional Limits for tasks assessed      General Comments: Pt is A and O x 4. C/o pain but was able to perform all desired task without assistance       Pertinent Vitals/Pain Pain Assessment: 0-10 Pain Score: 9  Pain Location: Surgical site Pain Descriptors / Indicators: Discomfort;Sore;Operative site guarding Pain Intervention(s): Limited activity within patient's tolerance;Monitored during session;Premedicated before session;Repositioned;Ice applied           PT Goals (current goals can now be found in the care plan section) Acute Rehab PT Goals Patient Stated Goal: to return home Progress towards PT goals: Progressing toward goals    Frequency    7X/week      PT Plan Frequency needs to be updated       AM-PAC PT "6 Clicks" Mobility   Outcome Measure  Help needed turning from your back to your side while in a flat bed without using bedrails?: None Help needed moving from lying on your back to sitting on the side of a flat bed without using bedrails?: None Help needed moving to and from a bed to a chair (including a wheelchair)?: None Help needed standing up from a chair using your arms (e.g., wheelchair or bedside chair)?: None Help needed to walk in hospital room?: None Help needed climbing 3-5 steps with a railing? : None 6 Click Score: 24  End of Session Equipment Utilized During Treatment: Back brace Activity Tolerance: Patient tolerated treatment well Patient left: in  chair;with call bell/phone within reach;with chair alarm set;with family/visitor present Nurse Communication: Mobility status PT Visit Diagnosis: Other abnormalities of gait and mobility (R26.89);Pain;History of falling (Z91.81)     Time: 1150-1210 PT Time Calculation (min) (ACUTE ONLY): 20 min  Charges:  $Gait Training: 8-22 mins                     Julaine Fusi PTA 09/16/20, 2:04 PM

## 2020-09-17 LAB — GLUCOSE, CAPILLARY: Glucose-Capillary: 146 mg/dL — ABNORMAL HIGH (ref 70–99)

## 2020-09-17 MED ORDER — HYDROMORPHONE HCL 4 MG PO TABS: 4.0000 mg | ORAL_TABLET | ORAL | 0 refills | Status: DC | PRN

## 2020-09-17 MED ORDER — METHOCARBAMOL 500 MG PO TABS: 500.0000 mg | ORAL_TABLET | Freq: Four times a day (QID) | ORAL | 0 refills | Status: DC | PRN

## 2020-09-17 MED ORDER — ACETAMINOPHEN 500 MG PO TABS: 1000.0000 mg | ORAL_TABLET | Freq: Four times a day (QID) | ORAL | 0 refills | Status: AC

## 2020-09-17 MED ORDER — CELECOXIB 200 MG PO CAPS: 200.0000 mg | ORAL_CAPSULE | Freq: Two times a day (BID) | ORAL | 0 refills | Status: DC

## 2020-09-17 NOTE — Progress Notes (Signed)
Physical Therapy Treatment Patient Details Name: Howard Murphy MRN: [id number] DOB: 11/24/1955 Today's Date: 09/17/2020    History of Present Illness HANCEL ION is here for adjacent segment disease with spondylolisthesis.  He underwent T10-pelvis PSF with extension from his prior L2-pelvis fusion, as well as L1-2 decompression.    PT Comments    Pt was A and O x 4 and agreeable to PT session. Easily able to perform all desired task safely. I'ly placed LSO back brace without assistance. He has safely demonstrated all functional task to DC home once deemed medically stable. He was in recliner post session with call bell in reach. Will continue to follow per current POC.   Follow Up Recommendations  Follow surgeon's recommendation for DC plan and follow-up therapies;Outpatient PT     Equipment Recommendations  None recommended by PT       Precautions / Restrictions Precautions Precautions: Fall;Back Precaution Booklet Issued: No Required Braces or Orthoses: Spinal Brace Spinal Brace: Thoracolumbosacral orthotic;Applied in sitting position Restrictions Weight Bearing Restrictions: No    Mobility  Bed Mobility Overal bed mobility: Modified Independent     Transfers Overall transfer level: Modified independent     Ambulation/Gait Ambulation/Gait assistance: Modified independent (Device/Increase time)          Balance Overall balance assessment: Needs assistance Sitting-balance support: Bilateral upper extremity supported;Feet supported Sitting balance-Leahy Scale: Good     Standing balance support: During functional activity;No upper extremity supported Standing balance-Leahy Scale: Good         Cognition Arousal/Alertness: Awake/alert Behavior During Therapy: WFL for tasks assessed/performed Overall Cognitive Status: Within Functional Limits for tasks assessed      General Comments: Pt is A and O x 4. C/o pain but was able to perform all desired task  without assistance             Pertinent Vitals/Pain Pain Assessment: 0-10 Pain Score: 7  Pain Location: back Pain Descriptors / Indicators: Discomfort;Sore;Operative site guarding Pain Intervention(s): Limited activity within patient's tolerance;Monitored during session;Premedicated before session;Repositioned           PT Goals (current goals can now be found in the care plan section) Acute Rehab PT Goals Patient Stated Goal: to return home Progress towards PT goals: Progressing toward goals    Frequency    7X/week      PT Plan Current plan remains appropriate    AM-PAC PT "6 Clicks" Mobility   Outcome Measure  Help needed turning from your back to your side while in a flat bed without using bedrails?: None Help needed moving from lying on your back to sitting on the side of a flat bed without using bedrails?: None Help needed moving to and from a bed to a chair (including a wheelchair)?: None Help needed standing up from a chair using your arms (e.g., wheelchair or bedside chair)?: None Help needed to walk in hospital room?: None Help needed climbing 3-5 steps with a railing? : None 6 Click Score: 24    End of Session Equipment Utilized During Treatment: Back brace Activity Tolerance: Patient tolerated treatment well Patient left: in chair;with call bell/phone within reach;with chair alarm set;with family/visitor present Nurse Communication: Mobility status PT Visit Diagnosis: Other abnormalities of gait and mobility (R26.89);Pain;History of falling (Z91.81)     Time: 3007-6226 PT Time Calculation (min) (ACUTE ONLY): 10 min  Charges:  $Gait Training: 8-22 mins  Julaine Fusi PTA 09/17/20, 9:45 AM

## 2020-09-17 NOTE — Discharge Instructions (Addendum)
  Your surgeon has performed an operation on your lumbar spine (low back) to relieve pressure on one or more nerves. Many times, patients feel better immediately after surgery and can "overdo it." Even if you feel well, it is important that you follow these activity guidelines. If you do not let your back heal properly from the surgery, you can increase the chance of a disc herniation and/or return of your symptoms. The following are instructions to help in your recovery once you have been discharged from the hospital.  * Do not take anti-inflammatory medications for 3 months after surgery (naproxen [Aleve], ibuprofen [Advil, Motrin], etc.) Celebrex is ok to take.  Activity    No bending, lifting, or twisting ("BLT"). Avoid lifting objects heavier than 10 pounds (gallon milk jug).  Where possible, avoid household activities that involve lifting, bending, pushing, or pulling such as laundry, vacuuming, grocery shopping, and childcare. Try to arrange for help from friends and family for these activities while your back heals.  Increase physical activity slowly as tolerated.  Taking short walks is encouraged, but avoid strenuous exercise. Do not jog, run, bicycle, lift weights, or participate in any other exercises unless specifically allowed by your doctor. Avoid prolonged sitting, including car rides.  Talk to your doctor before resuming sexual activity.  You should not drive until cleared by your doctor.  Until released by your doctor, you should not return to work or school.  You should rest at home and let your body heal.   You may shower four days after your surgery.  After showering, lightly dab your incision dry. Do not take a tub bath or go swimming for 3 weeks, or until approved by your doctor at your follow-up appointment.  If you smoke, we strongly recommend that you quit.  Smoking has been proven to interfere with normal healing in your back and will dramatically reduce the success rate  of your surgery. Please contact [facility] (800-QUIT-NOW) and use the resources at www.[facility].com for assistance in stopping smoking.  Surgical Incision   Ok to clean with mild soap and warm water, but do not scrub.  Do not submerge incision in water for 3 weeks.   Remove dressing on Monday April 11.   Diet            You may return to your usual diet. Be sure to stay hydrated.  When to Contact us  Although your surgery and recovery will likely be uneventful, you may have some residual numbness, aches, and pains in your back and/or legs. This is normal and should improve in the next few weeks.  However, should you experience any of the following, contact us immediately: . New numbness or weakness . Pain that is progressively getting worse, and is not relieved by your pain medications or rest . Bleeding, redness, swelling, pain, or drainage from surgical incision . Chills or flu-like symptoms . Fever greater than 101.0 F (38.3 C) . Problems with bowel or bladder functions . Difficulty breathing or shortness of breath . Warmth, tenderness, or swelling in your calf  Contact Information . During office hours (Monday-Friday 9 am to 5 pm), please call your physician at [phone] . After hours and weekends, please call [phone] and speak with the answering service, who will contact the doctor on call.  If that fails, call the [facility] Operator at [phone] and ask for the Neurosurgery Resident On Call  . For a life-threatening emergency, call 911

## 2020-09-17 NOTE — Progress Notes (Signed)
    Attending Progress Note  History: RUEBEN KASSIM is here for adjacent segment disease with spondylolisthesis.  He underwent T10-pelvis PSF with extension from his prior L2-pelvis fusion, as well as L1-2 decompression.  POD3: Doing well.  Sitting in chair.  Tolerating PO.   POD2: Much better pain control overnight after switching to hydromorphone.  Has begun slowly mobilizing.  POD1: Expected pain overnight.  RLE pain is improved.    Physical Exam: Vitals:   09/17/20 0450 09/17/20 0747  BP: 115/78 113/78  Pulse: 83 84  Resp: 16 18  Temp: 98.2 F (36.8 C) 98.4 F (36.9 C)  SpO2: 95% 96%    AA Ox3 CNI  Strength:5/5 throughout BLE SILT Dressing intact  Drain 105, 30 O/N  Data:  Recent Labs  Lab 09/15/20 0936  NA 136  K 4.0  CL 102  CO2 25  BUN 20  CREATININE 0.99  GLUCOSE 128*  CALCIUM 8.4*   No results for input(s): AST, ALT, ALKPHOS in the last 168 hours.  Invalid input(s): TBILI   Recent Labs  Lab 09/15/20 0936 09/16/20 0503  WBC 15.5* 16.2*  HGB 15.1 13.9  HCT 45.2 41.6  PLT 223 206   No results for input(s): APTT, INR in the last 168 hours.       Other tests/results: films reviewed - good placement of implants and alignment  Assessment/Plan:  [name] is doing well after extension of fusion to T10 with L1-2 decompression.  - mobilize - pain control - continue current regimen - DVT prophylaxis - PTOT - Appreciate medicine consultation for diabetes management. Defer to Dr. Arbutus Ped and partners regarding ongoing diabetic management.  Blood sugars currently well controlled. - Will remove drain and take down dressing today.  Meade Maw MD, [facility] Department of Neurosurgery

## 2020-09-17 NOTE — Plan of Care (Signed)
Patient had an uneventful shift. No changes in neurological and neurovascular assessments. Pain controlled with PRN medications. Hemovac drained 30 cc  over shift .Denies any needs at this time. All Safety measures maintained. Care continues.  Problem: Education: Goal: Knowledge of General Education information will improve Description: Including pain rating scale, medication(s)/side effects and non-pharmacologic comfort measures Outcome: Progressing   Problem: Health Behavior/Discharge Planning: Goal: Ability to manage health-related needs will improve Outcome: Progressing   Problem: Clinical Measurements: Goal: Ability to maintain clinical measurements within normal limits will improve Outcome: Progressing Goal: Will remain free from infection Outcome: Progressing Goal: Diagnostic test results will improve Outcome: Progressing Goal: Respiratory complications will improve Outcome: Progressing Goal: Cardiovascular complication will be avoided Outcome: Progressing   Problem: Activity: Goal: Risk for activity intolerance will decrease Outcome: Progressing   Problem: Nutrition: Goal: Adequate nutrition will be maintained Outcome: Progressing   Problem: Coping: Goal: Level of anxiety will decrease Outcome: Progressing   Problem: Elimination: Goal: Will not experience complications related to bowel motility Outcome: Progressing Goal: Will not experience complications related to urinary retention Outcome: Progressing   Problem: Pain Managment: Goal: General experience of comfort will improve Outcome: Progressing   Problem: Safety: Goal: Ability to remain free from injury will improve Outcome: Progressing   Problem: Skin Integrity: Goal: Risk for impaired skin integrity will decrease Outcome: Progressing

## 2020-09-17 NOTE — Discharge Summary (Signed)
Physician Discharge Summary  Patient ID: Howard Murphy MRN: [id number] DOB/AGE: 1955/09/20 65 y.o.  Admit date: 09/14/2020 Discharge date: 09/17/2020  Admission Diagnoses: lumbar adjacent segment disease with spondylolisthesis  Discharge Diagnoses:  Active Problems:   Essential hypertension   Type 2 diabetes mellitus (HCC)   Lumbar adjacent segment disease with spondylolisthesis   Discharged Condition: good  Hospital Course: [name] was admitted for surgical intervention.  He tolerated this very well, but required change in medications to achieve adequate pain control.  On POD3, his drains were all removed and he was stable for discharge.  Consults: Medicine for sugar control  Significant Diagnostic Studies: radiology: Surgical xrays show good placement of implants  Treatments: surgery: extension of fusion to T10, L1-2 decompression  Discharge Exam: Blood pressure 113/78, pulse 84, temperature 98.4 F (36.9 C), temperature source Oral, resp. rate 18, height 5\' 7"  (1.702 m), weight 80.3 kg, SpO2 96 %. General appearance: alert and cooperative 5/5 throughout Incision c/d/i  Disposition: Discharge disposition: 01-Home or Self Care       Discharge Instructions    Ambulatory referral to Physical Therapy   Complete by: As directed    Incentive spirometry RT   Complete by: As directed      Allergies as of 09/17/2020      Reactions   Adhesive [tape] Other (See Comments)   Honeycomb dressing, blisters      Medication List    STOP taking these medications   ibuprofen 800 MG tablet Commonly known as: ADVIL     TAKE these medications   acetaminophen 500 MG tablet Commonly known as: TYLENOL Take 2 tablets (1,000 mg total) by mouth every 6 (six) hours.   atorvastatin 40 MG tablet Commonly known as: LIPITOR Take 1 tablet (40 mg total) by mouth at bedtime.   celecoxib 200 MG capsule Commonly known as: CELEBREX Take 1 capsule (200 mg total) by mouth every 12  (twelve) hours.   chlorthalidone 50 MG tablet Commonly known as: HYGROTON Take 50 mg by mouth daily.   HYDROmorphone 4 MG tablet Commonly known as: DILAUDID Take 1 tablet (4 mg total) by mouth every 3 (three) hours as needed for severe pain.   Januvia 100 MG tablet Generic drug: sitaGLIPtin Take 100 mg by mouth daily.   Jardiance 25 MG Tabs tablet Generic drug: empagliflozin Take 25 mg by mouth daily.   lisinopril 20 MG tablet Commonly known as: ZESTRIL Take 20 mg by mouth daily.   meclizine 25 MG tablet Commonly known as: ANTIVERT Take 25 mg by mouth every 6 (six) hours as needed for dizziness.   methocarbamol 500 MG tablet Commonly known as: ROBAXIN Take 1-2 tablets (500-1,000 mg total) by mouth every 6 (six) hours as needed for muscle spasms.   oxyCODONE-acetaminophen 10-325 MG tablet Commonly known as: Percocet Take 1 tablet by mouth every 6 (six) hours as needed for pain. Must last 30 days.   oxyCODONE-acetaminophen 10-325 MG tablet Commonly known as: Percocet Take 1 tablet by mouth every 8 (eight) hours as needed for pain. Must last 30 days. Start taking on: September 28, 2020   oxyCODONE-acetaminophen 10-325 MG tablet Commonly known as: Percocet Take 1 tablet by mouth every 8 (eight) hours as needed for pain. Must last 30 days. Start taking on: Oct 28, 2020   tamsulosin 0.4 MG Caps capsule Commonly known as: FLOMAX Take 1 capsule (0.4 mg total) by mouth daily.   testosterone cypionate 200 MG/ML injection Commonly known as: DEPOTESTOSTERONE CYPIONATE Inject 200  mg into the muscle every 7 (seven) days.   zolpidem 10 MG tablet Commonly known as: AMBIEN Take 10 mg by mouth at bedtime.       Follow-up Information    Meade Maw, MD Follow up in 2 week(s).   Specialty: Neurosurgery Why: already scheduled Contact information: [place] [address] [phone]               Signed: Meade Maw 09/17/2020, 11:00  AM

## 2020-09-17 NOTE — Progress Notes (Signed)
   09/17/20 1125  Vitals  Temp 97.7 F (36.5 C)  Temp Source Oral  BP 114/69  MAP (mmHg) 84  BP Location Left Arm  BP Method Automatic  Patient Position (if appropriate) Sitting  Pulse Rate 88  Pulse Rate Source Monitor  Resp 17  MEWS COLOR  MEWS Score Color Green  Oxygen Therapy  SpO2 96 %  O2 Device Room Air    Pt ready for discharge. Discharge instructions provided and all questions answered at this time. Pt provided paper prescription for medications not sent directly to pharmacy and follow up appointment has been scheduled. Pt's mode of transport at discharge is wheelchair and he is accompanied by his spouse.

## 2020-09-21 NOTE — Anesthesia Postprocedure Evaluation (Signed)
Anesthesia Post Note  Patient: Howard Murphy  Procedure(s) Performed: OPEN T10-PELVIS POSTERIOR SPINAL FUSION (HARDWARE ALREADY PRESENT AT L2-S2); L1-2 POSTERIOR SPINAL DECOMPRESSION (N/A ) BONE MARROW ASPIRATION FOR SPINE FUSION ONLY (BMAC) (N/A )  Patient location during evaluation: PACU Anesthesia Type: General Level of consciousness: awake and alert Pain management: pain level controlled Vital Signs Assessment: post-procedure vital signs reviewed and stable Respiratory status: spontaneous breathing, nonlabored ventilation and respiratory function stable Cardiovascular status: blood pressure returned to baseline and stable Postop Assessment: no apparent nausea or vomiting Anesthetic complications: no   No complications documented.   Last Vitals:  Vitals:   09/17/20 1125 09/17/20 1125  BP: 114/69 114/69  Pulse: 88 88  Resp: 17 17  Temp: 36.5 C 36.5 C  SpO2: 96% 96%    Last Pain:  Vitals:   09/17/20 1125  TempSrc: Oral  PainSc:                  Tera Mater

## 2020-09-24 ENCOUNTER — Other Ambulatory Visit: Payer: Self-pay

## 2020-09-24 ENCOUNTER — Inpatient Hospital Stay
Admission: EM | Admit: 2020-09-24 | Discharge: 2020-09-27 | DRG: 316 | Disposition: A | Discharge status: Home / Self Care | Payer: Medicare (Managed Care) | Attending: Internal Medicine | Admitting: Internal Medicine

## 2020-09-24 DIAGNOSIS — K219 Gastro-esophageal reflux disease without esophagitis: Secondary | ICD-10-CM | POA: Diagnosis present

## 2020-09-24 DIAGNOSIS — F1120 Opioid dependence, uncomplicated: Secondary | ICD-10-CM

## 2020-09-24 DIAGNOSIS — M159 Polyosteoarthritis, unspecified: Secondary | ICD-10-CM | POA: Diagnosis present

## 2020-09-24 DIAGNOSIS — M545 Low back pain, unspecified: Secondary | ICD-10-CM | POA: Diagnosis not present

## 2020-09-24 DIAGNOSIS — R55 Syncope and collapse: Secondary | ICD-10-CM | POA: Diagnosis not present

## 2020-09-24 DIAGNOSIS — Z87442 Personal history of urinary calculi: Secondary | ICD-10-CM

## 2020-09-24 DIAGNOSIS — E785 Hyperlipidemia, unspecified: Secondary | ICD-10-CM | POA: Diagnosis not present

## 2020-09-24 DIAGNOSIS — I959 Hypotension, unspecified: Secondary | ICD-10-CM | POA: Diagnosis not present

## 2020-09-24 DIAGNOSIS — E119 Type 2 diabetes mellitus without complications: Secondary | ICD-10-CM | POA: Diagnosis not present

## 2020-09-24 DIAGNOSIS — M4326 Fusion of spine, lumbar region: Secondary | ICD-10-CM

## 2020-09-24 DIAGNOSIS — M79641 Pain in right hand: Secondary | ICD-10-CM

## 2020-09-24 DIAGNOSIS — M79642 Pain in left hand: Secondary | ICD-10-CM

## 2020-09-24 DIAGNOSIS — Z9889 Other specified postprocedural states: Secondary | ICD-10-CM

## 2020-09-24 DIAGNOSIS — D649 Anemia, unspecified: Secondary | ICD-10-CM | POA: Diagnosis present

## 2020-09-24 DIAGNOSIS — Z981 Arthrodesis status: Secondary | ICD-10-CM

## 2020-09-24 DIAGNOSIS — E876 Hypokalemia: Secondary | ICD-10-CM | POA: Diagnosis present

## 2020-09-24 DIAGNOSIS — I10 Essential (primary) hypertension: Secondary | ICD-10-CM | POA: Diagnosis present

## 2020-09-24 DIAGNOSIS — Z20822 Contact with and (suspected) exposure to covid-19: Secondary | ICD-10-CM | POA: Diagnosis present

## 2020-09-24 DIAGNOSIS — M4316 Spondylolisthesis, lumbar region: Secondary | ICD-10-CM

## 2020-09-24 DIAGNOSIS — G8929 Other chronic pain: Secondary | ICD-10-CM | POA: Diagnosis present

## 2020-09-24 DIAGNOSIS — G894 Chronic pain syndrome: Secondary | ICD-10-CM

## 2020-09-24 DIAGNOSIS — Z79899 Other long term (current) drug therapy: Secondary | ICD-10-CM

## 2020-09-24 DIAGNOSIS — Z87891 Personal history of nicotine dependence: Secondary | ICD-10-CM

## 2020-09-24 DIAGNOSIS — Z79891 Long term (current) use of opiate analgesic: Secondary | ICD-10-CM

## 2020-09-24 DIAGNOSIS — G5793 Unspecified mononeuropathy of bilateral lower limbs: Secondary | ICD-10-CM

## 2020-09-24 DIAGNOSIS — M5136 Other intervertebral disc degeneration, lumbar region: Secondary | ICD-10-CM

## 2020-09-24 DIAGNOSIS — Z91048 Other nonmedicinal substance allergy status: Secondary | ICD-10-CM

## 2020-09-24 DIAGNOSIS — Z8249 Family history of ischemic heart disease and other diseases of the circulatory system: Secondary | ICD-10-CM

## 2020-09-24 LAB — CBC WITH DIFFERENTIAL/PLATELET
Abs Immature Granulocytes: 0.05 10*3/uL (ref 0.00–0.07)
Basophils Absolute: 0 10*3/uL (ref 0.0–0.1)
Basophils Relative: 0 %
Eosinophils Absolute: 0.1 10*3/uL (ref 0.0–0.5)
Eosinophils Relative: 1 %
HCT: 37.4 % — ABNORMAL LOW (ref 39.0–52.0)
Hemoglobin: 12 g/dL — ABNORMAL LOW (ref 13.0–17.0)
Immature Granulocytes: 0 %
Lymphocytes Relative: 18 %
Lymphs Abs: 2.2 10*3/uL (ref 0.7–4.0)
MCH: 29.6 pg (ref 26.0–34.0)
MCHC: 32.1 g/dL (ref 30.0–36.0)
MCV: 92.3 fL (ref 80.0–100.0)
Monocytes Absolute: 1.2 10*3/uL — ABNORMAL HIGH (ref 0.1–1.0)
Monocytes Relative: 10 %
Neutro Abs: 8.3 10*3/uL — ABNORMAL HIGH (ref 1.7–7.7)
Neutrophils Relative %: 71 %
Platelets: 383 10*3/uL (ref 150–400)
RBC: 4.05 MIL/uL — ABNORMAL LOW (ref 4.22–5.81)
RDW: 14 % (ref 11.5–15.5)
WBC: 11.9 10*3/uL — ABNORMAL HIGH (ref 4.0–10.5)
nRBC: 0 % (ref 0.0–0.2)

## 2020-09-24 LAB — LACTIC ACID, PLASMA: Lactic Acid, Venous: 1.8 mmol/L (ref 0.5–1.9)

## 2020-09-24 LAB — URINALYSIS, COMPLETE (UACMP) WITH MICROSCOPIC
Bacteria, UA: NONE SEEN
Bilirubin Urine: NEGATIVE
Glucose, UA: >=500 mg/dL — AB
Hgb urine dipstick: NEGATIVE
Ketones, ur: NEGATIVE mg/dL
Leukocytes,Ua: NEGATIVE
Nitrite: NEGATIVE
Protein, ur: NEGATIVE mg/dL
Specific Gravity, Urine: 1.003 — ABNORMAL LOW (ref 1.005–1.030)
Squamous Epithelial / HPF: NONE SEEN (ref 0–5)
pH: 7 (ref 5.0–8.0)

## 2020-09-24 LAB — TROPONIN I (HIGH SENSITIVITY)
Troponin I (High Sensitivity): 4 ng/L (ref <18)
Troponin I (High Sensitivity): 5 ng/L (ref <18)

## 2020-09-24 LAB — BASIC METABOLIC PANEL
Anion gap: 7 (ref 5–15)
BUN: 17 mg/dL (ref 8–23)
CO2: 23 mmol/L (ref 22–32)
Calcium: 8.4 mg/dL — ABNORMAL LOW (ref 8.9–10.3)
Chloride: 108 mmol/L (ref 98–111)
Creatinine, Ser: 1.4 mg/dL — ABNORMAL HIGH (ref 0.61–1.24)
GFR, Estimated: 56 mL/min — ABNORMAL LOW (ref >60)
Glucose, Bld: 136 mg/dL — ABNORMAL HIGH (ref 70–99)
Potassium: 3.3 mmol/L — ABNORMAL LOW (ref 3.5–5.1)
Sodium: 138 mmol/L (ref 135–145)

## 2020-09-24 MED ORDER — SODIUM CHLORIDE 0.9 % IV BOLUS
500.0000 mL | Freq: Once | INTRAVENOUS | Status: AC
  Administered 2020-09-25: 500 mL via INTRAVENOUS

## 2020-09-24 MED ORDER — OXYCODONE-ACETAMINOPHEN 5-325 MG PO TABS
1.0000 | ORAL_TABLET | Freq: Once | ORAL | Status: AC
Start: 2020-09-24 — End: 2020-09-25
  Administered 2020-09-25: 1 via ORAL
  Filled 2020-09-24: qty 1

## 2020-09-24 MED ORDER — SODIUM CHLORIDE 0.9 % IV BOLUS
1000.0000 mL | Freq: Once | INTRAVENOUS | Status: AC
  Administered 2020-09-24: 1000 mL via INTRAVENOUS

## 2020-09-24 MED ORDER — OXYCODONE-ACETAMINOPHEN 5-325 MG PO TABS
1.0000 | ORAL_TABLET | Freq: Once | ORAL | Status: AC
Start: 2020-09-24 — End: 2020-09-24
  Administered 2020-09-24: 1 via ORAL
  Filled 2020-09-24: qty 1

## 2020-09-24 MED ORDER — HYDROMORPHONE HCL 1 MG/ML IJ SOLN
1.0000 mg | Freq: Once | INTRAMUSCULAR | Status: AC
  Administered 2020-09-24: 1 mg via INTRAVENOUS
  Filled 2020-09-24: qty 1

## 2020-09-24 NOTE — ED Triage Notes (Signed)
Pt had back surgery a week ago, today pt got up from using to toilet and had LOC. Pt denies injury to head, c/o back pain, weakness, and dizziness upon standing. Pt is AOX4, appears tired. BP was 82/50 with EMS, liter of IVF was given en route. BGL 202.

## 2020-09-24 NOTE — ED Notes (Signed)
Report received from [facility]

## 2020-09-24 NOTE — ED Provider Notes (Signed)
[facility] Emergency Department Provider Note  ____________________________________________   Event Date/Time   First MD Initiated Contact with Patient 09/24/20 1901     (approximate)  I have reviewed the triage vital signs and the nursing notes.   HISTORY  Chief Complaint Loss of Consciousness  HPI Howard Murphy is a 65 y.o. male presents to the ED via EMS from home after reported near syncopal episode.  The patient is 10 days status post an open T10 to posterior pelvic spinal fusion.  He describes walking to the restroom with his fiance standing by, when he felt lightheaded and apparently passed out.  He was unresponsive according to report by EMS for less than a minute.  Patient denies any head injury related to the fall.  He presents with EMS reporting blood pressure 82/50.  Pulse 74.  O2 sat 96% on room air.   Past Medical History:  Diagnosis Date  . 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  . Back ache 05/05/2013  . Diabetes mellitus without complication ([place])   . GERD (gastroesophageal reflux disease)   . Headache    MIGRAINES  . History of kidney stones   . Hyperlipemia   . Hypertension   . Kidney stones   . Kidney stones   . Nephrolithiasis   . Shortness of breath dyspnea     Patient Active Problem List   Diagnosis Date Noted  . Lumbar adjacent segment disease with spondylolisthesis 09/14/2020  . Uncomplicated opioid dependence ([place]) 02/20/2020  . DDD (degenerative disc disease), lumbar 02/03/2020  . Grade 1 Retrolisthesis of L1/L2 01/05/2020  . Lumbar disc extrusion with 7 mm caudal migration (L1-2) 01/05/2020  . Lumbosacral radiculopathy at L1 (Right) 01/05/2020  . Groin pain (Right) 01/05/2020  . Neural foraminal stenosis of lumbar spine (L1-2) (Right) 01/05/2020  . DDD (degenerative disc disease), lumbosacral 01/05/2020  . Acute exacerbation of chronic low back pain 12/30/2019  . Pharmacologic therapy 11/25/2019  . Disorder of  skeletal system 11/25/2019  . Problems influencing health status 11/25/2019  . Osteoarthritis involving multiple joints 02/23/2019  . Chronic musculoskeletal pain 02/23/2019  . Insomnia secondary to chronic pain 02/23/2019  . Neurogenic claudication ([place]) 04/02/2018  . Chronic bilateral low back pain with bilateral sciatica 03/05/2018  . Spondylosis without myelopathy or radiculopathy, lumbosacral region 01/14/2018  . Other specified dorsopathies, sacral and sacrococcygeal region 12/26/2017  . Acute appendicitis 12/18/2017  . Medication monitoring encounter 03/07/2017  . 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 Spondylolisthesis at L3-L4 level 08/03/2016  . Chronic pain syndrome 07/09/2016  . Encounter for medication monitoring 02/24/2016  . Cervical spondylosis 12/17/2015  . Cervical facet arthropathy 12/17/2015  . Chronic neck pain (Bilateral) (R>L) 12/17/2015  . Cervical facet syndrome (Bilateral) (R>L) 12/17/2015  . Chronic upper extremity pain (Bilateral) (R>L) 12/17/2015  . Cervical foraminal stenosis 12/17/2015  .  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 Arthropathy of shoulder (Right) 12/17/2015  . 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 Lumbar foraminal stenosis  (Left: L4-5) (Right: L1-2) 12/17/2015  . Grade 1 Anterolisthesis (2 mm) of L4 over L5. 12/17/2015  . Lumbar facet (L4-5) synovial cyst (6 mm) (Left) 12/17/2015  . Chronic sacroiliac joint pain (Bilateral) (L>R) 12/08/2015  . Neurogenic pain 12/08/2015  . Abnormal MRI, lumbar spine (12/23/2019) 11/21/2015  . Radicular pain of shoulder (Right) 10/03/2015  . Carpal tunnel syndrome (Bilateral) 10/03/2015  . Chronic  cervical radicular pain (Right) 10/03/2015  . Chronic low back pain (1ry area of Pain) (Bilateral) (L>R) w/o sciatica 09/06/2015  . Opiate use (60 MME/Day) 09/05/2015  . Long term  prescription opiate use 09/05/2015  . Long term current use of opiate analgesic 09/05/2015  . Spondylosis of lumbar spine 09/05/2015  . Lumbar facet joint syndrome ([place]) 09/05/2015  . Chronic hip pain (Left) 09/05/2015  . Diabetic peripheral neuropathy ([place]) 09/05/2015  . Chronic lumbar radicular pain (S1 Dermatome) (Bilateral) (L>R) 09/05/2015  . Hypokalemia 09/05/2015  . Chronic shoulder pain (2ry area of Pain) (Bilateral) (L>R) 09/05/2015  . Diabetes ([place]) 09/05/2015  . Chronic foot pain (3ry area of Pain) (Bilateral) (L>R) 09/05/2015  . Chronic hand pain (4th area of Pain) (Bilateral) (R>L) 09/05/2015  . Lumbar facet hypertrophy 09/05/2015  . Encounter for therapeutic drug level monitoring 09/05/2015  . Low testosterone 09/05/2015  . Occipital pain (Right) 09/05/2015  . 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 Hypertriglyceridemia 07/26/2015  . Chronic abdominal pain (RUQ) 07/14/2015  . Type 2 diabetes mellitus ([place]) 07/14/2015  . Chest pain 07/14/2015  . Complete tear of the shoulder rotator cuff (Left) 05/10/2015  . Complete tear of left rotator cuff 05/10/2015  . SOB (shortness of breath) 03/22/2015  . Elevated hemoglobin ([place]) 07/07/2013  . History of lumbar facet Synovial cyst, surgically removed. 05/29/2013    Class: History of  . 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 GERD (gastroesophageal reflux disease) 05/05/2013  . Benign prostatic hyperplasia 05/05/2013  . Essential hypertension 05/08/2011  . Hyperlipidemia 05/08/2011    Past Surgical History:  Procedure Laterality Date  . APPENDECTOMY    . BACK SURGERY  2015   x3  . BACK SURGERY  08/09/2016  . BACK SURGERY Bilateral 2019  . CHOLECYSTECTOMY  2000  . COLONOSCOPY WITH PROPOFOL N/A 04/11/2015   Procedure: COLONOSCOPY WITH PROPOFOL;  Surgeon: Manya Silvas, MD;  Location: [facility] ENDOSCOPY;  Service: Endoscopy;  Laterality: N/A;  . CYSTOSCOPY WITH URETEROSCOPY AND STENT PLACEMENT Right 08/27/2017    Procedure: CYSTOSCOPY WITH URETEROSCOPY AND STENT PLACEMENT;  Surgeon: Abbie Sons, MD;  Location: [facility] ORS;  Service: Urology;  Laterality: Right;  . ESOPHAGOGASTRODUODENOSCOPY (EGD) WITH PROPOFOL N/A 04/11/2015   Procedure: ESOPHAGOGASTRODUODENOSCOPY (EGD) WITH PROPOFOL;  Surgeon: Manya Silvas, MD;  Location: [facility] ENDOSCOPY;  Service: Endoscopy;  Laterality: N/A;  . LAPAROSCOPIC APPENDECTOMY N/A 12/18/2017   Procedure: APPENDECTOMY LAPAROSCOPIC;  Surgeon: Florene Glen, MD;  Location: [facility] ORS;  Service: General;  Laterality: N/A;  . POSTERIOR LUMBAR FUSION 4 LEVEL N/A 09/14/2020   Procedure: OPEN T10-PELVIS POSTERIOR SPINAL FUSION (HARDWARE ALREADY PRESENT AT L2-S2); L1-2 POSTERIOR SPINAL DECOMPRESSION;  Surgeon: Meade Maw, MD;  Location: [facility] ORS;  Service: Neurosurgery;  Laterality: N/A;  . SHOULDER SURGERY Right 2016   X2  . SHOULDER SURGERY Left 06/2015    Prior to Admission medications   Medication Sig Start Date End Date Taking? Authorizing Provider  acetaminophen (TYLENOL) 500 MG tablet Take 2 tablets (1,000 mg total) by mouth every 6 (six) hours. 09/17/20   Meade Maw, MD  atorvastatin (LIPITOR) 40 MG tablet Take 1 tablet (40 mg total) by mouth at bedtime. 03/08/17   Arnetha Courser, MD  celecoxib (CELEBREX) 200 MG capsule Take 1 capsule (200 mg total) by mouth every 12 (twelve) hours. 09/17/20 10/17/20  Meade Maw, MD  chlorthalidone (HYGROTON) 50 MG tablet Take 50 mg by mouth daily.    [provider]  HYDROmorphone (DILAUDID) 4 MG  tablet Take 1 tablet (4 mg total) by mouth every 3 (three) hours as needed for severe pain. 09/17/20   Meade Maw, MD  JANUVIA 100 MG tablet Take 100 mg by mouth daily. 05/26/20   [provider]  JARDIANCE 25 MG TABS tablet Take 25 mg by mouth daily. 02/06/20   [provider]  lisinopril (ZESTRIL) 20 MG tablet Take 20 mg by mouth daily. 09/18/18   [provider]  meclizine (ANTIVERT)  25 MG tablet Take 25 mg by mouth every 6 (six) hours as needed for dizziness.    [provider]  methocarbamol (ROBAXIN) 500 MG tablet Take 1-2 tablets (500-1,000 mg total) by mouth every 6 (six) hours as needed for muscle spasms. 09/17/20   Meade Maw, MD  oxyCODONE-acetaminophen (PERCOCET) 10-325 MG tablet Take 1 tablet by mouth every 6 (six) hours as needed for pain. Must last 30 days. 08/29/20 09/28/20  Milinda Pointer, MD  oxyCODONE-acetaminophen (PERCOCET) 10-325 MG tablet Take 1 tablet by mouth every 8 (eight) hours as needed for pain. Must last 30 days. 09/28/20 10/28/20  Milinda Pointer, MD  oxyCODONE-acetaminophen (PERCOCET) 10-325 MG tablet Take 1 tablet by mouth every 8 (eight) hours as needed for pain. Must last 30 days. 10/28/20 11/27/20  Milinda Pointer, MD  tamsulosin (FLOMAX) 0.4 MG CAPS capsule Take 1 capsule (0.4 mg total) by mouth daily. 11/04/18   Billey Co, MD  testosterone cypionate (DEPOTESTOSTERONE CYPIONATE) 200 MG/ML injection Inject 200 mg into the muscle every 7 (seven) days. 10/07/18   [provider]  zolpidem (AMBIEN) 10 MG tablet Take 10 mg by mouth at bedtime.    [provider]    Allergies Adhesive [tape]  Family History  Problem Relation Age of Onset  . Cancer Mother        Breast CA  . Stroke Father   . Heart disease Father   . Cancer Sister   . Cancer Maternal Grandmother   . Cancer Sister   . Bladder Cancer Neg Hx   . Prostate cancer Neg Hx   . Kidney cancer Neg Hx     Social History Social History   Tobacco Use  . Smoking status: Never Smoker  . Smokeless tobacco: Former Systems developer    Types: Secondary school teacher  . Vaping Use: Never used  Substance Use Topics  . Alcohol use: No  . Drug use: No    Review of Systems  Constitutional: No fever/chills Eyes: No visual changes. ENT: No sore throat. Cardiovascular: Denies chest pain. Respiratory: Denies shortness of breath. Gastrointestinal: No abdominal  pain.  No nausea, no vomiting.  No diarrhea.  No constipation. Genitourinary: Negative for dysuria. Musculoskeletal: Positive for post-op back pain. Skin: Negative for rash. Neurological: Negative for headaches, focal weakness or numbness. ____________________________________________   PHYSICAL EXAM:  VITAL SIGNS: ED Triage Vitals  Enc Vitals Group     BP      Pulse      Resp      Temp      Temp src      SpO2      Weight      Height      Head Circumference      Peak Flow      Pain Score      Pain Loc      Pain Edu?      Excl. in [place]?    Constitutional: Alert and oriented. Well appearing and in no acute distress. Eyes: Conjunctivae are  normal. PERRL. EOMI. Head: Atraumatic. Nose: No congestion/rhinnorhea. Mouth/Throat: Mucous membranes are moist.  Oropharynx non-erythematous. Neck: No stridor.  No cervical spine tenderness to palpation. Cardiovascular: Normal rate, regular rhythm. Grossly normal heart sounds.  Good peripheral circulation. Respiratory: Normal respiratory effort.  No retractions. Lungs CTAB. Gastrointestinal: Soft and nontender. No distention. No abdominal bruits. No CVA tenderness. Musculoskeletal: Normal spinal alignment without midline tenderness, spasm, deformity, or step-off.  Patient with a well healing central spinal scar from T9-L5.  No signs of infection noted.  No wound dehiscence is appreciated.  No lower extremity tenderness nor edema.  No joint effusions. Neurologic:  Normal speech and language. No gross focal neurologic deficits are appreciated.  Normal toe dorsiflexion foot eversion on exam. Skin:  Skin is warm, dry and intact. No rash noted. Psychiatric: Mood and affect are normal. Speech and behavior are normal.  ____________________________________________   LABS (all labs ordered are listed, but only abnormal results are displayed)  Labs Reviewed  BASIC METABOLIC PANEL - Abnormal; Notable for the following components:      Result Value    Potassium 3.3 (*)    Glucose, Bld 136 (*)    Creatinine, Ser 1.40 (*)    Calcium 8.4 (*)    GFR, Estimated 56 (*)    All other components within normal limits  CBC WITH DIFFERENTIAL/PLATELET - Abnormal; Notable for the following components:   WBC 11.9 (*)    RBC 4.05 (*)    Hemoglobin 12.0 (*)    HCT 37.4 (*)    Neutro Abs 8.3 (*)    Monocytes Absolute 1.2 (*)    All other components within normal limits  URINALYSIS, COMPLETE (UACMP) WITH MICROSCOPIC - Abnormal; Notable for the following components:   Color, Urine STRAW (*)    APPearance CLEAR (*)    Specific Gravity, Urine 1.003 (*)    Glucose, UA >=500 (*)    All other components within normal limits  LACTIC ACID, PLASMA  LACTIC ACID, PLASMA  TROPONIN I (HIGH SENSITIVITY)   ____________________________________________  EKG  ____________________________________________  RADIOLOGY I, Melvenia Needles, personally viewed and evaluated these images (plain radiographs) as part of my medical decision making, as well as reviewing the written report by the radiologist.  ED MD interpretation:    Official radiology report(s): No results found.  ____________________________________________   PROCEDURES  Procedure(s) performed (including Critical Care):  Procedures NS 1000 ml bolus  ____________________________________________   INITIAL IMPRESSION / ASSESSMENT AND PLAN / ED COURSE  As part of my medical decision making, I reviewed the following data within the [place] History obtained from family, Old chart reviewed and Notes from prior ED visits  DDX: vasovagal syncope, ACS, hypovolemia, electrolyte abnormality  Patient is 10 days postop from a neurosurgical procedure, fusion of T10 to the sacrum.  He experienced a syncopal episode today without subsequent head injury.  He presented to the ED for evaluation was found to be hypotensive but stable without tachycardia.  Labs are reassuring at  this time.  Patient will be managed by my attending physician for final disposition determine once all labs and appropriate testing are available. ____________________________________________   FINAL CLINICAL IMPRESSION(S) / ED DIAGNOSES  Final diagnoses:  Syncope, unspecified syncope type  Post-operative state     ED Discharge Orders    None      *Please note:  Howard Murphy was evaluated in Emergency Department on 09/24/2020 for the symptoms described in the history of present illness. He  was evaluated in the context of the global COVID-19 pandemic, which necessitated consideration that the patient might be at risk for infection with the SARS-CoV-2 virus that causes COVID-19. Institutional protocols and algorithms that pertain to the evaluation of patients at risk for COVID-19 are in a state of rapid change based on information released by regulatory bodies including the CDC and federal and state organizations. These policies and algorithms were followed during the patient's care in the ED.  Some ED evaluations and interventions may be delayed as a result of limited staffing during and the pandemic.*   Note:  This document was prepared using Dragon voice recognition software and may include unintentional dictation errors.    Melvenia Needles, PA-C 09/24/20 2128    Arta Silence, MD 09/25/20 Judithann Sauger    Arta Silence, MD 09/25/20 0020

## 2020-09-24 NOTE — ED Provider Notes (Signed)
ED ECG REPORT I, Howard Murphy, the attending physician, personally viewed and interpreted this ECG.  Date: 09/24/2020 EKG Time: 1914 Rate: 62 Rhythm: normal sinus rhythm QRS Axis: normal Intervals: normal ST/T Wave abnormalities: normal Narrative Interpretation: no evidence of acute ischemia    Howard Silence, MD 09/24/20 2135

## 2020-09-25 ENCOUNTER — Observation Stay: Payer: Medicare (Managed Care)

## 2020-09-25 ENCOUNTER — Encounter: Payer: Self-pay | Admitting: Family Medicine

## 2020-09-25 ENCOUNTER — Observation Stay
Admit: 2020-09-25 | Discharge: 2020-09-25 | Disposition: A | Discharge status: Home / Self Care | Payer: Medicare (Managed Care) | Attending: Family Medicine | Admitting: Family Medicine

## 2020-09-25 DIAGNOSIS — M4326 Fusion of spine, lumbar region: Secondary | ICD-10-CM

## 2020-09-25 DIAGNOSIS — R55 Syncope and collapse: Secondary | ICD-10-CM | POA: Diagnosis not present

## 2020-09-25 LAB — GLUCOSE, CAPILLARY
Glucose-Capillary: 89 mg/dL (ref 70–99)
Glucose-Capillary: 112 mg/dL — ABNORMAL HIGH (ref 70–99)

## 2020-09-25 LAB — CBC
HCT: 39.3 % (ref 39.0–52.0)
HCT: 40.5 % (ref 39.0–52.0)
Hemoglobin: 13.2 g/dL (ref 13.0–17.0)
Hemoglobin: 13.6 g/dL (ref 13.0–17.0)
MCH: 30.5 pg (ref 26.0–34.0)
MCH: 30 pg (ref 26.0–34.0)
MCHC: 33.6 g/dL (ref 30.0–36.0)
MCHC: 33.6 g/dL (ref 30.0–36.0)
MCV: 90.8 fL (ref 80.0–100.0)
MCV: 89.2 fL (ref 80.0–100.0)
Platelets: 400 10*3/uL (ref 150–400)
Platelets: 456 10*3/uL — ABNORMAL HIGH (ref 150–400)
RBC: 4.33 MIL/uL (ref 4.22–5.81)
RBC: 4.54 MIL/uL (ref 4.22–5.81)
RDW: 14 % (ref 11.5–15.5)
RDW: 13.9 % (ref 11.5–15.5)
WBC: 11.8 10*3/uL — ABNORMAL HIGH (ref 4.0–10.5)
WBC: 12.7 10*3/uL — ABNORMAL HIGH (ref 4.0–10.5)
nRBC: 0 % (ref 0.0–0.2)
nRBC: 0 % (ref 0.0–0.2)

## 2020-09-25 LAB — CBG MONITORING, ED
Glucose-Capillary: 147 mg/dL — ABNORMAL HIGH (ref 70–99)
Glucose-Capillary: 120 mg/dL — ABNORMAL HIGH (ref 70–99)
Glucose-Capillary: 111 mg/dL — ABNORMAL HIGH (ref 70–99)

## 2020-09-25 LAB — ECHOCARDIOGRAM COMPLETE
AR max vel: 1.76 cm2
AV Peak grad: 13.8 mmHg
Ao pk vel: 1.86 m/s
Area-P 1/2: 4.86 cm2
Height: 67 in
S' Lateral: 3.04 cm
Weight: 2880 oz

## 2020-09-25 LAB — BASIC METABOLIC PANEL
Anion gap: 7 (ref 5–15)
BUN: 14 mg/dL (ref 8–23)
CO2: 23 mmol/L (ref 22–32)
Calcium: 8.4 mg/dL — ABNORMAL LOW (ref 8.9–10.3)
Chloride: 110 mmol/L (ref 98–111)
Creatinine, Ser: 1.03 mg/dL (ref 0.61–1.24)
GFR, Estimated: >60 mL/min (ref >60)
Glucose, Bld: 151 mg/dL — ABNORMAL HIGH (ref 70–99)
Potassium: 4 mmol/L (ref 3.5–5.1)
Sodium: 140 mmol/L (ref 135–145)

## 2020-09-25 LAB — LACTIC ACID, PLASMA: Lactic Acid, Venous: 1 mmol/L (ref 0.5–1.9)

## 2020-09-25 LAB — RESP PANEL BY RT-PCR (FLU A&B, COVID) ARPGX2
Influenza A by PCR: NEGATIVE
Influenza B by PCR: NEGATIVE
SARS Coronavirus 2 by RT PCR: NEGATIVE

## 2020-09-25 LAB — MAGNESIUM: Magnesium: 1.8 mg/dL (ref 1.7–2.4)

## 2020-09-25 LAB — HIV ANTIBODY (ROUTINE TESTING W REFLEX): HIV Screen 4th Generation wRfx: NONREACTIVE

## 2020-09-25 LAB — SEDIMENTATION RATE: Sed Rate: 63 mm/hr — ABNORMAL HIGH (ref 0–20)

## 2020-09-25 LAB — C-REACTIVE PROTEIN: CRP: 3 mg/dL — ABNORMAL HIGH (ref <1.0)

## 2020-09-25 MED ORDER — TAMSULOSIN HCL 0.4 MG PO CAPS
0.4000 mg | ORAL_CAPSULE | Freq: Every day | ORAL | Status: DC
  Administered 2020-09-25 – 2020-09-27 (×3): 0.4 mg via ORAL
  Filled 2020-09-25 (×4): qty 1

## 2020-09-25 MED ORDER — CELECOXIB 200 MG PO CAPS: 200.0000 mg | ORAL_CAPSULE | Freq: Two times a day (BID) | ORAL | Status: DC

## 2020-09-25 MED ORDER — SODIUM CHLORIDE 0.9% FLUSH
3.0000 mL | Freq: Two times a day (BID) | INTRAVENOUS | Status: DC
  Administered 2020-09-25 – 2020-09-26 (×3): 3 mL via INTRAVENOUS

## 2020-09-25 MED ORDER — LIDOCAINE 5 % EX PTCH
1.0000 | MEDICATED_PATCH | CUTANEOUS | Status: DC
  Administered 2020-09-25 – 2020-09-27 (×3): 1 via TRANSDERMAL
  Filled 2020-09-25 (×3): qty 1

## 2020-09-25 MED ORDER — ZOLPIDEM TARTRATE 5 MG PO TABS
5.0000 mg | ORAL_TABLET | Freq: Every day | ORAL | Status: DC
  Administered 2020-09-25 – 2020-09-26 (×3): 5 mg via ORAL
  Filled 2020-09-25 (×3): qty 1

## 2020-09-25 MED ORDER — MECLIZINE HCL 25 MG PO TABS: 25.0000 mg | ORAL_TABLET | Freq: Four times a day (QID) | ORAL | Status: DC | PRN

## 2020-09-25 MED ORDER — MAGNESIUM HYDROXIDE 400 MG/5ML PO SUSP: 30.0000 mL | Freq: Every day | ORAL | Status: DC | PRN

## 2020-09-25 MED ORDER — CHLORTHALIDONE 25 MG PO TABS: 50.0000 mg | ORAL_TABLET | Freq: Every day | ORAL | Status: DC

## 2020-09-25 MED ORDER — ONDANSETRON HCL 4 MG PO TABS: 4.0000 mg | ORAL_TABLET | Freq: Four times a day (QID) | ORAL | Status: DC | PRN

## 2020-09-25 MED ORDER — POTASSIUM CHLORIDE IN NACL 20-0.9 MEQ/L-% IV SOLN
INTRAVENOUS | Status: DC
  Filled 2020-09-25: qty 1000

## 2020-09-25 MED ORDER — POTASSIUM CHLORIDE IN NACL 20-0.9 MEQ/L-% IV SOLN
INTRAVENOUS | Status: AC
  Filled 2020-09-25: qty 1000

## 2020-09-25 MED ORDER — ZOLPIDEM TARTRATE 5 MG PO TABS: 5.0000 mg | ORAL_TABLET | Freq: Every day | ORAL | Status: DC

## 2020-09-25 MED ORDER — OXYCODONE HCL 5 MG PO TABS
5.0000 mg | ORAL_TABLET | Freq: Four times a day (QID) | ORAL | Status: DC | PRN
  Administered 2020-09-25 – 2020-09-26 (×3): 5 mg via ORAL
  Filled 2020-09-25 (×3): qty 1

## 2020-09-25 MED ORDER — ACETAMINOPHEN 650 MG RE SUPP: 650.0000 mg | Freq: Four times a day (QID) | RECTAL | Status: DC | PRN

## 2020-09-25 MED ORDER — OXYCODONE-ACETAMINOPHEN 7.5-325 MG PO TABS: 1.0000 | ORAL_TABLET | Freq: Four times a day (QID) | ORAL | Status: DC | PRN

## 2020-09-25 MED ORDER — HYDROMORPHONE HCL 2 MG PO TABS
4.0000 mg | ORAL_TABLET | ORAL | Status: DC | PRN
Start: 2020-09-25 — End: 2020-09-25
  Administered 2020-09-25 (×2): 4 mg via ORAL
  Filled 2020-09-25 (×2): qty 2

## 2020-09-25 MED ORDER — MORPHINE SULFATE (PF) 2 MG/ML IV SOLN
2.0000 mg | INTRAVENOUS | Status: DC | PRN
  Administered 2020-09-25 (×2): 2 mg via INTRAVENOUS
  Filled 2020-09-25 (×2): qty 1

## 2020-09-25 MED ORDER — TRAZODONE HCL 50 MG PO TABS
25.0000 mg | ORAL_TABLET | Freq: Every evening | ORAL | Status: DC | PRN
  Administered 2020-09-25: 25 mg via ORAL
  Filled 2020-09-25 (×2): qty 1

## 2020-09-25 MED ORDER — POTASSIUM CHLORIDE 20 MEQ PO PACK
40.0000 meq | PACK | Freq: Once | ORAL | Status: AC
  Administered 2020-09-25: 40 meq via ORAL
  Filled 2020-09-25: qty 2

## 2020-09-25 MED ORDER — EMPAGLIFLOZIN 25 MG PO TABS
25.0000 mg | ORAL_TABLET | Freq: Every day | ORAL | Status: DC
  Administered 2020-09-25 – 2020-09-27 (×2): 25 mg via ORAL
  Filled 2020-09-25 (×3): qty 1

## 2020-09-25 MED ORDER — METHOCARBAMOL 500 MG PO TABS
500.0000 mg | ORAL_TABLET | Freq: Four times a day (QID) | ORAL | Status: DC | PRN
  Administered 2020-09-25: 500 mg via ORAL
  Filled 2020-09-25 (×3): qty 1

## 2020-09-25 MED ORDER — ATORVASTATIN CALCIUM 20 MG PO TABS
40.0000 mg | ORAL_TABLET | Freq: Every day | ORAL | Status: DC
  Administered 2020-09-25 – 2020-09-26 (×2): 40 mg via ORAL
  Filled 2020-09-25 (×2): qty 2

## 2020-09-25 MED ORDER — OXYCODONE-ACETAMINOPHEN 5-325 MG PO TABS
1.0000 | ORAL_TABLET | Freq: Four times a day (QID) | ORAL | Status: DC | PRN
  Administered 2020-09-25 – 2020-09-27 (×2): 1 via ORAL
  Filled 2020-09-25 (×2): qty 1

## 2020-09-25 MED ORDER — ONDANSETRON HCL 4 MG/2ML IJ SOLN: 4.0000 mg | Freq: Four times a day (QID) | INTRAMUSCULAR | Status: DC | PRN

## 2020-09-25 MED ORDER — LORAZEPAM BOLUS VIA INFUSION: 0.5000 mg | Freq: Once | INTRAVENOUS | Status: DC

## 2020-09-25 MED ORDER — CELECOXIB 200 MG PO CAPS
200.0000 mg | ORAL_CAPSULE | Freq: Two times a day (BID) | ORAL | Status: DC
  Administered 2020-09-25 – 2020-09-26 (×3): 200 mg via ORAL
  Filled 2020-09-25 (×5): qty 1

## 2020-09-25 MED ORDER — HYDROMORPHONE HCL 1 MG/ML IJ SOLN
2.0000 mg | INTRAMUSCULAR | Status: DC | PRN
  Administered 2020-09-25 – 2020-09-27 (×11): 2 mg via INTRAVENOUS
  Filled 2020-09-25 (×11): qty 2

## 2020-09-25 MED ORDER — ENOXAPARIN SODIUM 40 MG/0.4ML ~~LOC~~ SOLN
40.0000 mg | SUBCUTANEOUS | Status: DC
  Administered 2020-09-25: 40 mg via SUBCUTANEOUS
  Filled 2020-09-25: qty 0.4

## 2020-09-25 MED ORDER — LORAZEPAM 2 MG/ML IJ SOLN
0.5000 mg | Freq: Once | INTRAMUSCULAR | Status: AC
  Administered 2020-09-25: 0.5 mg via INTRAVENOUS
  Filled 2020-09-25: qty 1

## 2020-09-25 MED ORDER — LISINOPRIL 10 MG PO TABS
20.0000 mg | ORAL_TABLET | Freq: Every day | ORAL | Status: DC
  Administered 2020-09-25: 20 mg via ORAL
  Filled 2020-09-25: qty 2

## 2020-09-25 MED ORDER — LINAGLIPTIN 5 MG PO TABS
5.0000 mg | ORAL_TABLET | Freq: Every day | ORAL | Status: DC
  Administered 2020-09-25 – 2020-09-27 (×2): 5 mg via ORAL
  Filled 2020-09-25 (×3): qty 1

## 2020-09-25 MED ORDER — ACETAMINOPHEN 325 MG PO TABS: 650.0000 mg | ORAL_TABLET | Freq: Four times a day (QID) | ORAL | Status: DC | PRN

## 2020-09-25 MED ORDER — FENTANYL 50 MCG/HR TD PT72
1.0000 | MEDICATED_PATCH | TRANSDERMAL | Status: DC
Start: 2020-09-25 — End: 2020-09-26
  Administered 2020-09-25: 1 via TRANSDERMAL
  Filled 2020-09-25: qty 1

## 2020-09-25 MED ORDER — INSULIN ASPART 100 UNIT/ML ~~LOC~~ SOLN
0.0000 [IU] | Freq: Three times a day (TID) | SUBCUTANEOUS | Status: DC
  Administered 2020-09-26: 1 [IU] via SUBCUTANEOUS
  Filled 2020-09-25: qty 1

## 2020-09-25 MED ORDER — FENTANYL CITRATE (PF) 100 MCG/2ML IJ SOLN
25.0000 ug | INTRAMUSCULAR | Status: DC | PRN
  Administered 2020-09-25: 25 ug via INTRAVENOUS
  Filled 2020-09-25: qty 2

## 2020-09-25 MED ORDER — CYCLOBENZAPRINE HCL 5 MG PO TABS
7.5000 mg | ORAL_TABLET | Freq: Three times a day (TID) | ORAL | Status: DC
  Administered 2020-09-25 – 2020-09-27 (×7): 7.5 mg via ORAL
  Filled 2020-09-25 (×11): qty 1.5

## 2020-09-25 NOTE — Consult Note (Signed)
Referring Physician:  No referring provider defined for this encounter.  Primary Physician:  Center, [facility]  Chief Complaint:  Syncope, back pain  History of Present Illness: 09/25/2020 Howard Murphy is a 65 y.o. male who presents with the chief complaint of syncope.  He is POD11 from extension of fusion to T10 with L1-2 decompression.  He and his fiance have noted some swelling in his back.    He was getting up to go to the restroom when this occurred.  He does note some pain in his testicles.  He was evaluated in the ER, where some lab abnormalities were noted.  He has elevated WBC, but it is lower than it was 9 days ago.  Review of Systems:  A 10 point review of systems is negative, except for the pertinent positives and negatives detailed in the HPI.  Past Medical History: Past Medical History:  Diagnosis Date  . 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  . Back ache 05/05/2013  . Diabetes mellitus without complication ([place])   . GERD (gastroesophageal reflux disease)   . Headache    MIGRAINES  . History of kidney stones   . Hyperlipemia   . Hypertension   . Kidney stones   . Kidney stones   . Nephrolithiasis   . Shortness of breath dyspnea     Past Surgical History: Past Surgical History:  Procedure Laterality Date  . APPENDECTOMY    . BACK SURGERY  2015   x3  . BACK SURGERY  08/09/2016  . BACK SURGERY Bilateral 2019  . CHOLECYSTECTOMY  2000  . COLONOSCOPY WITH PROPOFOL N/A 04/11/2015   Procedure: COLONOSCOPY WITH PROPOFOL;  Surgeon: Manya Silvas, MD;  Location: [facility] ENDOSCOPY;  Service: Endoscopy;  Laterality: N/A;  . CYSTOSCOPY WITH URETEROSCOPY AND STENT PLACEMENT Right 08/27/2017   Procedure: CYSTOSCOPY WITH URETEROSCOPY AND STENT PLACEMENT;  Surgeon: Abbie Sons, MD;  Location: [facility] ORS;  Service: Urology;  Laterality: Right;  . ESOPHAGOGASTRODUODENOSCOPY (EGD) WITH PROPOFOL N/A 04/11/2015   Procedure: ESOPHAGOGASTRODUODENOSCOPY (EGD)  WITH PROPOFOL;  Surgeon: Manya Silvas, MD;  Location: [facility] ENDOSCOPY;  Service: Endoscopy;  Laterality: N/A;  . LAPAROSCOPIC APPENDECTOMY N/A 12/18/2017   Procedure: APPENDECTOMY LAPAROSCOPIC;  Surgeon: Florene Glen, MD;  Location: [facility] ORS;  Service: General;  Laterality: N/A;  . POSTERIOR LUMBAR FUSION 4 LEVEL N/A 09/14/2020   Procedure: OPEN T10-PELVIS POSTERIOR SPINAL FUSION (HARDWARE ALREADY PRESENT AT L2-S2); L1-2 POSTERIOR SPINAL DECOMPRESSION;  Surgeon: Meade Maw, MD;  Location: [facility] ORS;  Service: Neurosurgery;  Laterality: N/A;  . SHOULDER SURGERY Right 2016   X2  . SHOULDER SURGERY Left 06/2015    Allergies: Allergies as of 09/24/2020 - Review Complete 09/24/2020  Allergen Reaction Noted  . Adhesive [tape] Other (See Comments) 04/04/2018    Medications:  Current Facility-Administered Medications:  .  0.9 % NaCl with KCl 20 mEq/ L  infusion, , Intravenous, Continuous, Domenic Polite, MD, Last Rate: 75 mL/hr at 09/25/20 1127, New Bag at 09/25/20 1127 .  acetaminophen (TYLENOL) tablet 650 mg, 650 mg, Oral, Q6H PRN **OR** acetaminophen (TYLENOL) suppository 650 mg, 650 mg, Rectal, Q6H PRN, Mansy, Jan A, MD .  atorvastatin (LIPITOR) tablet 40 mg, 40 mg, Oral, QHS, Mansy, Jan A, MD .  celecoxib (CELEBREX) capsule 200 mg, 200 mg, Oral, Q12H, Oswald Hillock, RPH, 200 mg at 09/25/20 0951 .  cyclobenzaprine (FLEXERIL) tablet 7.5 mg, 7.5 mg, Oral, TID, Domenic Polite, MD, 7.5 mg at 09/25/20 1118 .  empagliflozin (JARDIANCE) tablet 25 mg, 25 mg, Oral, Daily, Mansy, Jan A, MD, 25 mg at 09/25/20 0951 .  enoxaparin (LOVENOX) injection 40 mg, 40 mg, Subcutaneous, Q24H, Mansy, Jan A, MD, 40 mg at 09/25/20 0953 .  fentaNYL (DURAGESIC) 50 MCG/HR 1 patch, 1 patch, Transdermal, Q72H, Domenic Polite, MD .  HYDROmorphone (DILAUDID) injection 2 mg, 2 mg, Intravenous, Q4H PRN, Domenic Polite, MD, 2 mg at 09/25/20 1121 .  insulin aspart (novoLOG) injection 0-9 Units, 0-9 Units,  Subcutaneous, TID PC & HS, Mansy, Jan A, MD .  lidocaine (LIDODERM) 5 % 1 patch, 1 patch, Transdermal, Q24H, Mansy, Jan A, MD, 1 patch at 09/25/20 0549 .  linagliptin (TRADJENTA) tablet 5 mg, 5 mg, Oral, Daily, Mansy, Jan A, MD, 5 mg at 09/25/20 0951 .  magnesium hydroxide (MILK OF MAGNESIA) suspension 30 mL, 30 mL, Oral, Daily PRN, Mansy, Jan A, MD .  ondansetron (ZOFRAN) tablet 4 mg, 4 mg, Oral, Q6H PRN **OR** ondansetron (ZOFRAN) injection 4 mg, 4 mg, Intravenous, Q6H PRN, Mansy, Jan A, MD .  oxyCODONE-acetaminophen (PERCOCET/ROXICET) 5-325 MG per tablet 1 tablet, 1 tablet, Oral, Q6H PRN **AND** oxyCODONE (Oxy IR/ROXICODONE) immediate release tablet 5 mg, 5 mg, Oral, Q6H PRN, Domenic Polite, MD .  sodium chloride flush (NS) 0.9 % injection 3 mL, 3 mL, Intravenous, Q12H, Mansy, Jan A, MD .  tamsulosin [facility]) capsule 0.4 mg, 0.4 mg, Oral, Daily, Mansy, Jan A, MD, 0.4 mg at 09/25/20 0952 .  traZODone (DESYREL) tablet 25 mg, 25 mg, Oral, QHS PRN, Mansy, Jan A, MD .  zolpidem (AMBIEN) tablet 5 mg, 5 mg, Oral, QHS, Mansy, Jan A, MD, 5 mg at 09/25/20 0211  Current Outpatient Medications:  .  acetaminophen (TYLENOL) 500 MG tablet, Take 2 tablets (1,000 mg total) by mouth every 6 (six) hours., Disp: 30 tablet, Rfl: 0 .  atorvastatin (LIPITOR) 40 MG tablet, Take 1 tablet (40 mg total) by mouth at bedtime., Disp: 30 tablet, Rfl: 5 .  celecoxib (CELEBREX) 200 MG capsule, Take 1 capsule (200 mg total) by mouth every 12 (twelve) hours., Disp: 60 capsule, Rfl: 0 .  chlorthalidone (HYGROTON) 50 MG tablet, Take 50 mg by mouth daily., Disp: , Rfl:  .  HYDROmorphone (DILAUDID) 4 MG tablet, Take 1 tablet (4 mg total) by mouth every 3 (three) hours as needed for severe pain., Disp: 30 tablet, Rfl: 0 .  JANUVIA 100 MG tablet, Take 100 mg by mouth daily., Disp: , Rfl:  .  JARDIANCE 25 MG TABS tablet, Take 25 mg by mouth daily., Disp: , Rfl:  .  lisinopril (ZESTRIL) 20 MG tablet, Take 20 mg by mouth daily., Disp: ,  Rfl:  .  methocarbamol (ROBAXIN) 500 MG tablet, Take 1-2 tablets (500-1,000 mg total) by mouth every 6 (six) hours as needed for muscle spasms., Disp: 120 tablet, Rfl: 0 .  oxyCODONE-acetaminophen (PERCOCET) 10-325 MG tablet, Take 1 tablet by mouth every 6 (six) hours as needed for pain. Must last 30 days., Disp: 120 tablet, Rfl: 0 .  tamsulosin (FLOMAX) 0.4 MG CAPS capsule, Take 1 capsule (0.4 mg total) by mouth daily., Disp: 14 capsule, Rfl: 0 .  zolpidem (AMBIEN) 10 MG tablet, Take 10 mg by mouth at bedtime., Disp: , Rfl:  .  meclizine (ANTIVERT) 25 MG tablet, Take 25 mg by mouth every 6 (six) hours as needed for dizziness., Disp: , Rfl:  .  [START ON 09/28/2020] oxyCODONE-acetaminophen (PERCOCET) 10-325 MG tablet, Take 1 tablet by mouth every 8 (eight) hours  as needed for pain. Must last 30 days., Disp: 90 tablet, Rfl: 0 .  [START ON 10/28/2020] oxyCODONE-acetaminophen (PERCOCET) 10-325 MG tablet, Take 1 tablet by mouth every 8 (eight) hours as needed for pain. Must last 30 days., Disp: 90 tablet, Rfl: 0 .  testosterone cypionate (DEPOTESTOSTERONE CYPIONATE) 200 MG/ML injection, Inject 200 mg into the muscle every 7 (seven) days., Disp: , Rfl:    Social History: Social History   Tobacco Use  . Smoking status: Never Smoker  . Smokeless tobacco: Former Systems developer    Types: Secondary school teacher  . Vaping Use: Never used  Substance Use Topics  . Alcohol use: No  . Drug use: No    Family Medical History: Family History  Problem Relation Age of Onset  . Cancer Mother        Breast CA  . Stroke Father   . Heart disease Father   . Cancer Sister   . Cancer Maternal Grandmother   . Cancer Sister   . Bladder Cancer Neg Hx   . Prostate cancer Neg Hx   . Kidney cancer Neg Hx     Physical Examination: Vitals:   09/25/20 0900 09/25/20 0904  BP: 112/68   Pulse: 78 84  Resp: 18 18  Temp:    SpO2: 92% 96%     General: Patient is well developed, well nourished, calm, collected, and in mild to  moderate distress.  Psychiatric: Patient is non-anxious.  Head:  Pupils equal, round, and reactive to light.  ENT:  Oral mucosa appears well hydrated.  Neck:   Supple.  Full range of motion.  Respiratory: Patient is breathing without any difficulty.  Extremities: No edema.  Vascular: Palpable pulses in dorsal pedal vessels.  Skin:   On exposed skin, there are no abnormal skin lesions.  NEUROLOGICAL:  General: In no acute distress.   Awake, alert, oriented to person, place, and time.  Pupils equal round and reactive to light.  Facial tone is symmetric.  Tongue protrusion is midline.  There is no pronator drift.   Strength: Side Biceps Triceps Deltoid Interossei Grip Wrist Ext. Wrist Flex.  R 5 5 5 5 5 5 5   L 5 5 5 5 5 5 5    Side Iliopsoas Quads Hamstring PF DF EHL  R 5 5 5 5 5 5   L 5 5 5 5 5 5     Bilateral upper and lower extremity sensation is intact to light touch. Reflexes are 1+ and symmetric at the biceps, triceps, brachioradialis, patella and achilles. Hoffman's is absent.  Clonus is not present.  Gait is untested.  Incision is c/d/i without drainage.  There is substantial soft tissue swelling bilaterally. I cannot appreciate an obvious seroma, but cannot exclude it either.  Imaging: Pending  I have personally reviewed the images and agree with the above interpretation.  Labs: CBC Latest Ref Rng & Units 09/25/2020 09/24/2020 09/16/2020  WBC 4.0 - 10.5 K/uL 11.8(H) 11.9(H) 16.2(H)  Hemoglobin 13.0 - 17.0 g/dL 13.2 12.0(L) 13.9  Hematocrit 39.0 - 52.0 % 39.3 37.4(L) 41.6  Platelets 150 - 400 K/uL 400 383 206       Assessment and Plan: Mr. Beever is a pleasant 65 y.o. male POD11 from extension of fusion who presents with syncope, swelling in his back, and continued pain.  I have some concern for seroma or fluid collection given the swelling.  I have sent off inflammatory markers and ordered MRI scan and imaging, as his fall may have  injured his  implants.  If there is a fluid collection, I may aspirate and send for culture to determine whether this represents a wound infection.  At this point, I cannot rule infection out.  Appreciate medicine involvement. Will defer next steps until imaging is obtained.   [place] Alkire K. Izora Ribas MD, [place] [facility]

## 2020-09-25 NOTE — ED Notes (Signed)
Pt. asked this student RN to look at his back for drainage from inscision. No drainage was noted. Bandage clean, dry, and intact.

## 2020-09-25 NOTE — ED Notes (Signed)
Pt reports pain still a 10, bed adjusted to attempt to make pt more comfortable. Pt states he will give the ambien more time to see if it helps him relax/sleep and if unsuccessful will call out to try PRN dilaudid.

## 2020-09-25 NOTE — ED Notes (Signed)
Pt's significant other Howard Murphy # [phone]

## 2020-09-25 NOTE — ED Notes (Signed)
Dr. Mansy at bedside. 

## 2020-09-25 NOTE — H&P (Addendum)
[place]   PATIENT NAME: Howard Murphy    MR#:  [id number]  DATE OF BIRTH:  May 10, 1956  DATE OF ADMISSION:  09/24/2020  PRIMARY CARE PHYSICIAN: [facility], [facility]   Patient is coming from: Home  REQUESTING/REFERRING PHYSICIAN: Arta Silence, MD CHIEF COMPLAINT:   Chief Complaint  Patient presents with  . Loss of Consciousness    HISTORY OF PRESENT ILLNESS:  Howard Murphy is a 65 y.o. Caucasian male with medical history significant for multiple medical problems including type 2 diabetes mellitus, GERD, hypertension, dyslipidemia and nephrolithiasis, who just underwent C-spine fusion about 9 days ago by Dr. Izora Ribas and presents to the emergency room with acute onset of syncope.  The patient was apparently getting up to go to the bathroom when this happened.  It was witnessed by his wife who denied any seizures.  He denied any headache or dizziness or blurred vision or chest tightness palpitations.  No paresthesias or focal muscle weakness.  He admits to mild cough without wheezing or hemoptysis.  No fever or chills.  He has occasional postoperative pain.  ED Course: When he came to the ER vital signs were within normal.  Labs revealed mild hypokalemia at 3.3 and creatinine 1.4 up from 0.99 on 09/15/2020 and mild leukocytosis on his CBC with mild anemia.  High-sensitivity troponin I was 4 and later 5.  Lactic acid was 1.8.  Respiratory panel is currently pending.  UA showed more than 500 glucose EKG as reviewed by me : Showed normal sinus rhythm with a rate of 62.  The patient was given 1 mg of IV Dilaudid, p.o. Percocet and 1 L bolus of IV normal saline.  He was admitted to an observation medical monitored bed for further evaluation and management. PAST MEDICAL HISTORY:   Past Medical History:  Diagnosis Date  . 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  . Back ache 05/05/2013  . Diabetes mellitus without complication ([place])   . GERD (gastroesophageal reflux  disease)   . Headache    MIGRAINES  . History of kidney stones   . Hyperlipemia   . Hypertension   . Kidney stones   . Kidney stones   . Nephrolithiasis   . Shortness of breath dyspnea     PAST SURGICAL HISTORY:   Past Surgical History:  Procedure Laterality Date  . APPENDECTOMY    . BACK SURGERY  2015   x3  . BACK SURGERY  08/09/2016  . BACK SURGERY Bilateral 2019  . CHOLECYSTECTOMY  2000  . COLONOSCOPY WITH PROPOFOL N/A 04/11/2015   Procedure: COLONOSCOPY WITH PROPOFOL;  Surgeon: Manya Silvas, MD;  Location: [facility] ENDOSCOPY;  Service: Endoscopy;  Laterality: N/A;  . CYSTOSCOPY WITH URETEROSCOPY AND STENT PLACEMENT Right 08/27/2017   Procedure: CYSTOSCOPY WITH URETEROSCOPY AND STENT PLACEMENT;  Surgeon: Abbie Sons, MD;  Location: [facility] ORS;  Service: Urology;  Laterality: Right;  . ESOPHAGOGASTRODUODENOSCOPY (EGD) WITH PROPOFOL N/A 04/11/2015   Procedure: ESOPHAGOGASTRODUODENOSCOPY (EGD) WITH PROPOFOL;  Surgeon: Manya Silvas, MD;  Location: [facility] ENDOSCOPY;  Service: Endoscopy;  Laterality: N/A;  . LAPAROSCOPIC APPENDECTOMY N/A 12/18/2017   Procedure: APPENDECTOMY LAPAROSCOPIC;  Surgeon: Florene Glen, MD;  Location: [facility] ORS;  Service: General;  Laterality: N/A;  . POSTERIOR LUMBAR FUSION 4 LEVEL N/A 09/14/2020   Procedure: OPEN T10-PELVIS POSTERIOR SPINAL FUSION (HARDWARE ALREADY PRESENT AT L2-S2); L1-2 POSTERIOR SPINAL DECOMPRESSION;  Surgeon: Meade Maw, MD;  Location: [facility] ORS;  Service: Neurosurgery;  Laterality:  N/A;  . SHOULDER SURGERY Right 2016   X2  . SHOULDER SURGERY Left 06/2015    SOCIAL HISTORY:   Social History   Tobacco Use  . Smoking status: Never Smoker  . Smokeless tobacco: Former Systems developer    Types: Chew  Substance Use Topics  . Alcohol use: No    FAMILY HISTORY:   Family History  Problem Relation Age of Onset  . Cancer Mother        Breast CA  . Stroke Father   . Heart disease Father   . Cancer Sister   . Cancer Maternal  Grandmother   . Cancer Sister   . Bladder Cancer Neg Hx   . Prostate cancer Neg Hx   . Kidney cancer Neg Hx     DRUG ALLERGIES:   Allergies  Allergen Reactions  . Adhesive [Tape] Other (See Comments)    Honeycomb dressing, blisters    REVIEW OF SYSTEMS:   ROS As per history of present illness. All pertinent systems were reviewed above. Constitutional, HEENT, cardiovascular, respiratory, GI, GU, musculoskeletal, neuro, psychiatric, endocrine, integumentary and hematologic systems were reviewed and are otherwise negative/unremarkable except for positive findings mentioned above in the HPI.   MEDICATIONS AT HOME:   Prior to Admission medications   Medication Sig Start Date End Date Taking? Authorizing Provider  acetaminophen (TYLENOL) 500 MG tablet Take 2 tablets (1,000 mg total) by mouth every 6 (six) hours. 09/17/20   Meade Maw, MD  atorvastatin (LIPITOR) 40 MG tablet Take 1 tablet (40 mg total) by mouth at bedtime. 03/08/17   Arnetha Courser, MD  celecoxib (CELEBREX) 200 MG capsule Take 1 capsule (200 mg total) by mouth every 12 (twelve) hours. 09/17/20 10/17/20  Meade Maw, MD  chlorthalidone (HYGROTON) 50 MG tablet Take 50 mg by mouth daily.    [provider]  HYDROmorphone (DILAUDID) 4 MG tablet Take 1 tablet (4 mg total) by mouth every 3 (three) hours as needed for severe pain. 09/17/20   Meade Maw, MD  JANUVIA 100 MG tablet Take 100 mg by mouth daily. 05/26/20   [provider]  JARDIANCE 25 MG TABS tablet Take 25 mg by mouth daily. 02/06/20   [provider]  lisinopril (ZESTRIL) 20 MG tablet Take 20 mg by mouth daily. 09/18/18   [provider]  meclizine (ANTIVERT) 25 MG tablet Take 25 mg by mouth every 6 (six) hours as needed for dizziness.    [provider]  methocarbamol (ROBAXIN) 500 MG tablet Take 1-2 tablets (500-1,000 mg total) by mouth every 6 (six) hours as needed for muscle spasms. 09/17/20   Meade Maw, MD  oxyCODONE-acetaminophen (PERCOCET) 10-325 MG tablet Take 1 tablet by mouth every 6 (six) hours as needed for pain. Must last 30 days. 08/29/20 09/28/20  Milinda Pointer, MD  oxyCODONE-acetaminophen (PERCOCET) 10-325 MG tablet Take 1 tablet by mouth every 8 (eight) hours as needed for pain. Must last 30 days. 09/28/20 10/28/20  Milinda Pointer, MD  oxyCODONE-acetaminophen (PERCOCET) 10-325 MG tablet Take 1 tablet by mouth every 8 (eight) hours as needed for pain. Must last 30 days. 10/28/20 11/27/20  Milinda Pointer, MD  tamsulosin (FLOMAX) 0.4 MG CAPS capsule Take 1 capsule (0.4 mg total) by mouth daily. 11/04/18   Billey Co, MD  testosterone cypionate (DEPOTESTOSTERONE CYPIONATE) 200 MG/ML injection Inject 200 mg into the muscle every 7 (seven) days. 10/07/18   [provider]  zolpidem (AMBIEN) 10 MG tablet Take 10 mg by mouth at  bedtime.    [provider]      VITAL SIGNS:  Blood pressure 101/63, pulse 70, temperature 98.7 F (37.1 C), temperature source Oral, resp. rate 20, height 5\' 7"  (1.702 m), weight 81.6 kg, SpO2 94 %.  PHYSICAL EXAMINATION:  Physical Exam  GENERAL:  65 y.o.-year-old Caucasian male patient lying in the bed with no acute distress.  EYES: Pupils equal, round, reactive to light and accommodation. No scleral icterus. Extraocular muscles intact.  HEENT: Head atraumatic, normocephalic. Oropharynx and nasopharynx clear.  NECK:  Supple, no jugular venous distention. No thyroid enlargement, no tenderness.  LUNGS: Normal breath sounds bilaterally, no wheezing, rales,rhonchi or crepitation. No use of accessory muscles of respiration.  CARDIOVASCULAR: Regular rate and rhythm, S1, S2 normal. No murmurs, rubs, or gallops.  ABDOMEN: Soft, nondistended, nontender. Bowel sounds present. No organomegaly or mass.  EXTREMITIES: No pedal edema, cyanosis, or clubbing.  NEUROLOGIC: Cranial nerves II through XII are intact. Muscle strength 5/5 in all  extremities. Sensation intact. Gait not checked.  PSYCHIATRIC: The patient is alert and oriented x 3.  Normal affect and good eye contact. SKIN: No obvious rash, lesion, or ulcer.   LABORATORY PANEL:   CBC Recent Labs  Lab 09/24/20 1922  WBC 11.9*  HGB 12.0*  HCT 37.4*  PLT 383   ------------------------------------------------------------------------------------------------------------------  Chemistries  Recent Labs  Lab 09/24/20 1922  NA 138  K 3.3*  CL 108  CO2 23  GLUCOSE 136*  BUN 17  CREATININE 1.40*  CALCIUM 8.4*   ------------------------------------------------------------------------------------------------------------------  Cardiac Enzymes No results for input(s): TROPONINI in the last 168 hours. ------------------------------------------------------------------------------------------------------------------  RADIOLOGY:  No results found.    IMPRESSION AND PLAN:  Active Problems:   Syncope  1.  Syncope. - The patient will be admitted to an observation medically monitored bed.   -Will check orthostatics q 12 hours.   - Will obtain a bilateral carotid Doppler and 2D echo.   - The patient will be gently hydrated with IV normal saline and monitored for arrhythmias. Differential diagnosis would include neurally mediated syncope, cardiogenic, arrhythmias related,  orthostatic hypotension and less likely hypoglycemia.  2.  Low back pain status post T10 fusion and L1-L2 decompression. -Pain management will be provided. -If it is more than what is expected postoperatively, neurosurgical consultation can be obtained.  3.  Dyslipidemia. - We will continue statin therapy.  4.  Type 2 diabetes mellitus. - The patient will be placed on CKD coverage with NovoLog and will continue his Januvia and Jardiance.  DVT prophylaxis: Lovenox. Code Status: full code.  Family Communication:  The plan of care was discussed in details with the patient (and his wife  who was with him in the room). I answered all questions. The patient agreed to proceed with the above mentioned plan. Further management will depend upon hospital course. Disposition Plan: Back to previous home environment Consults called: none. All the records are reviewed and case discussed with ED provider.  Status is: Observation  The patient remains OBS appropriate and will d/c before 2 midnights.  Dispo: The patient is from: Home              Anticipated d/c is to: Home              Patient currently is not medically stable to d/c.   Difficult to place patient No   TOTAL TIME TAKING CARE OF THIS PATIENT: 55 minutes.    Christel Mormon M.D on 09/25/2020 at 12:02 AM  [facility]   From 7 PM-7 AM, contact night-coverage [email or web address]  CC: Primary care physician; Center, [facility]

## 2020-09-25 NOTE — Progress Notes (Signed)
PT Cancellation Note  Patient Details Name: Howard Murphy MRN: [id number] DOB: Jun 17, 1955   Cancelled Treatment:    Reason Eval/Treat Not Completed: Other (comment). Pt declined PT this AM; reporting pain greater than 10/10 in his back, per RN had morphine less than 30 minutes ago. Pt encouraged to attempt to move/reposition in bed to see if that could help with the pain, pt declined. Pt to re-attempt as able, RN notified of pt status and pain levels.   Lieutenant Diego PT, DPT 9:39 AM,09/25/20

## 2020-09-25 NOTE — Progress Notes (Signed)
*  PRELIMINARY RESULTS* Echocardiogram 2D Echocardiogram has been performed.  Claretta Fraise 09/25/2020, 10:07 AM

## 2020-09-25 NOTE — Progress Notes (Signed)
PROGRESS NOTE    RALIEGH SCOBIE  YOV:[id number] DOB: 1955-11-10 DOA: 09/24/2020 PCP: [facility]  Brief Narrative: 65 year old male with history of chronic low back pain, history of multiple back surgeries, type 2 diabetes mellitus, nephrolithiasis, hypertension, dyslipidemia underwent lumbar spinal decompression by Dr. Cari Caraway on 4/6, subsequently discharged home on 4/9 on oral Dilaudid in addition to his chronic Percocets. -Patient reports severe 10/10 pain since then, barely able to move, yesterday he tried to walk a few steps to the bathroom, subsequently was found down by his fiance and brought to the ED 4/16.  -The ED was noted to be hypotensive with SBP of 82 have mild hypokalemia, mild leukocytosis, WBC of 11.8   Assessment & Plan:   Syncope -Most likely secondary to hypotension, no events on telemetry -Blood pressure improved after fluid bolus -He is on high-dose chlorthalidone and lisinopril at baseline which will be held -Follow-up echo, monitor on telemetry today -PT OT, ambulate as tolerated  Severe low back pain, Fall/syncope Recent lumbar decompression 4/6 Chronic back pain with multiple surgeries -Continues to be in excruciating pain reportedly for 8 to 9 days now -Neurosurgery Dr. Cari Caraway following, MRI LS spine ordered -Start fentanyl patch, continue Percocet for moderate pain and add IV Dilaudid for severe pain -Scheduled Flexeril, Celebrex, continue with lidocaine patch -PT eval  Type 2 diabetes mellitus -CBGs are stable, continue Januvia and Jardiance  DVT prophylaxis: Hold Lovenox pending MRI Code Status: Full code Family Communication: No family at bedside Disposition Plan:  Status is: Observation  The patient will require care spanning > 2 midnights and should be moved to inpatient because: Inpatient level of care appropriate due to severity of illness  Dispo: The patient is from: Home              Anticipated d/c is to: Home               Patient currently is not medically stable to d/c.   Difficult to place patient No  Consultants:   Neurosurgery Dr. Cari Caraway   Procedures:   Antimicrobials:    Subjective: -Complains of severe 10 out of 10 mid and low back pain since his surgery on 4/6, discharged home on 4/9, has been taking Percocet and 4 mg of oral Dilaudid round-the-clock with minimal relief  Objective: Vitals:   09/25/20 0800 09/25/20 0815 09/25/20 0900 09/25/20 0904  BP: 119/79  112/68   Pulse: 83 80 78 84  Resp: (!) 25 20 18 18   Temp:      TempSrc:      SpO2: 95% 96% 92% 96%  Weight:      Height:        Intake/Output Summary (Last 24 hours) at 09/25/2020 1140 Last data filed at 09/25/2020 1129 Gross per 24 hour  Intake 1500.02 ml  Output 600 ml  Net 900.02 ml   Filed Weights   09/24/20 1912  Weight: 81.6 kg    Examination:  General exam: Ill-appearing male laying in stretcher, awake alert oriented x3, very uncomfortable appearing HEENT: No JVD CVS: S1-S2, regular rhythm, tachycardic Lungs: Poor air movement, otherwise clear Abdomen: Soft, mildly distended, nontender, bowel sounds present Extremities: No edema Neuro: Moves all extremities, no localizing signs   Data Reviewed:   CBC: Recent Labs  Lab 09/24/20 1922 09/25/20 0500  WBC 11.9* 11.8*  NEUTROABS 8.3*  --   HGB 12.0* 13.2  HCT 37.4* 39.3  MCV 92.3 90.8  PLT 383 741   Basic Metabolic  Panel: Recent Labs  Lab 09/24/20 1922 09/25/20 0500 09/25/20 0506  NA 138 140  --   K 3.3* 4.0  --   CL 108 110  --   CO2 23 23  --   GLUCOSE 136* 151*  --   BUN 17 14  --   CREATININE 1.40* 1.03  --   CALCIUM 8.4* 8.4*  --   MG  --   --  1.8   GFR: Estimated Creatinine Clearance: 73.1 mL/min (by C-G formula based on SCr of 1.03 mg/dL). Liver Function Tests: No results for input(s): AST, ALT, ALKPHOS, BILITOT, PROT, ALBUMIN in the last 168 hours. No results for input(s): LIPASE, AMYLASE in the last 168 hours. No  results for input(s): AMMONIA in the last 168 hours. Coagulation Profile: No results for input(s): INR, PROTIME in the last 168 hours. Cardiac Enzymes: No results for input(s): CKTOTAL, CKMB, CKMBINDEX, TROPONINI in the last 168 hours. BNP (last 3 results) No results for input(s): PROBNP in the last 8760 hours. HbA1C: No results for input(s): HGBA1C in the last 72 hours. CBG: Recent Labs  Lab 09/25/20 0505 09/25/20 0825  GLUCAP 147* 120*   Lipid Profile: No results for input(s): CHOL, HDL, LDLCALC, TRIG, CHOLHDL, LDLDIRECT in the last 72 hours. Thyroid Function Tests: No results for input(s): TSH, T4TOTAL, FREET4, T3FREE, THYROIDAB in the last 72 hours. Anemia Panel: No results for input(s): VITAMINB12, FOLATE, FERRITIN, TIBC, IRON, RETICCTPCT in the last 72 hours. Urine analysis:    Component Value Date/Time   COLORURINE STRAW (A) 09/24/2020 1922   APPEARANCEUR CLEAR (A) 09/24/2020 1922   APPEARANCEUR Clear 08/15/2017 1347   LABSPEC 1.003 (L) 09/24/2020 1922   LABSPEC 1.015 03/23/2014 2003   PHURINE 7.0 09/24/2020 1922   GLUCOSEU >=500 (A) 09/24/2020 1922   GLUCOSEU 50 mg/dL 03/23/2014 2003   HGBUR NEGATIVE 09/24/2020 [place] NEGATIVE 09/24/2020 1922   BILIRUBINUR Negative 08/15/2017 1347   BILIRUBINUR Negative 03/23/2014 2003   KETONESUR NEGATIVE 09/24/2020 [place] NEGATIVE 09/24/2020 1922   UROBILINOGEN 1.0 02/23/2015 2347   NITRITE NEGATIVE 09/24/2020 1922   LEUKOCYTESUR NEGATIVE 09/24/2020 1922   LEUKOCYTESUR Negative 03/23/2014 2003   Sepsis Labs: @LABRCNTIP (procalcitonin:4,lacticidven:4)  ) Recent Results (from the past 240 hour(s))  Resp Panel by RT-PCR (Flu A&B, Covid) Nasopharyngeal Swab     Status: None   Collection Time: 09/25/20 12:38 AM   Specimen: Nasopharyngeal Swab; Nasopharyngeal(NP) swabs in vial transport medium  Result Value Ref Range Status   SARS Coronavirus 2 by RT PCR NEGATIVE NEGATIVE Final    Comment:  (NOTE) SARS-CoV-2 target nucleic acids are NOT DETECTED.  The SARS-CoV-2 RNA is generally detectable in upper respiratory specimens during the acute phase of infection. The lowest concentration of SARS-CoV-2 viral copies this assay can detect is 138 copies/mL. A negative result does not preclude SARS-Cov-2 infection and should not be used as the sole basis for treatment or other patient management decisions. A negative result may occur with  improper specimen collection/handling, submission of specimen other than nasopharyngeal swab, presence of viral mutation(s) within the areas targeted by this assay, and inadequate number of viral copies(<138 copies/mL). A negative result must be combined with clinical observations, patient history, and epidemiological information. The expected result is Negative.  Fact Sheet for Patients:  EntrepreneurPulse.com.au  Fact Sheet for Healthcare Providers:  IncredibleEmployment.be  This test is no t yet approved or cleared by the [place] FDA and  has been authorized for detection and/or diagnosis of  SARS-CoV-2 by FDA under an Emergency Use Authorization (EUA). This EUA will remain  in effect (meaning this test can be used) for the duration of the COVID-19 declaration under Section 564(b)(1) of the Act, 21 U.S.C.section 360bbb-3(b)(1), unless the authorization is terminated  or revoked sooner.       Influenza A by PCR NEGATIVE NEGATIVE Final   Influenza B by PCR NEGATIVE NEGATIVE Final    Comment: (NOTE) The Xpert Xpress SARS-CoV-2/FLU/RSV plus assay is intended as an aid in the diagnosis of influenza from Nasopharyngeal swab specimens and should not be used as a sole basis for treatment. Nasal washings and aspirates are unacceptable for Xpert Xpress SARS-CoV-2/FLU/RSV testing.  Fact Sheet for Patients: EntrepreneurPulse.com.au  Fact Sheet for Healthcare  Providers: IncredibleEmployment.be  This test is not yet approved or cleared by the [place] FDA and has been authorized for detection and/or diagnosis of SARS-CoV-2 by FDA under an Emergency Use Authorization (EUA). This EUA will remain in effect (meaning this test can be used) for the duration of the COVID-19 declaration under Section 564(b)(1) of the Act, 21 U.S.C. section 360bbb-3(b)(1), unless the authorization is terminated or revoked.  Performed at [facility], [address] [place], [place] [address]      Scheduled Meds: . atorvastatin  40 mg Oral QHS  . celecoxib  200 mg Oral Q12H  . cyclobenzaprine  7.5 mg Oral TID  . empagliflozin  25 mg Oral Daily  . enoxaparin (LOVENOX) injection  40 mg Subcutaneous Q24H  . fentaNYL  1 patch Transdermal Q72H  . insulin aspart  0-9 Units Subcutaneous TID PC & HS  . lidocaine  1 patch Transdermal Q24H  . linagliptin  5 mg Oral Daily  . sodium chloride flush  3 mL Intravenous Q12H  . tamsulosin  0.4 mg Oral Daily  . zolpidem  5 mg Oral QHS   Continuous Infusions: . 0.9 % NaCl with KCl 20 mEq / L 75 mL/hr at 09/25/20 1127     LOS: 0 days    Time spent: 82min  Domenic Polite, MD [facility]  09/25/2020, 11:40 AM

## 2020-09-25 NOTE — ED Notes (Signed)
Pt transported to Room 155

## 2020-09-25 NOTE — ED Notes (Signed)
Informed Rn bed [phone]

## 2020-09-25 NOTE — ED Notes (Signed)
Pt states he is unable to do orthostatic vitals at this time d/t back pain

## 2020-09-25 NOTE — ED Notes (Signed)
ECHO at bedside.

## 2020-09-25 NOTE — ED Notes (Signed)
Pt given meal tray.

## 2020-09-26 ENCOUNTER — Encounter: Payer: Self-pay | Admitting: Family Medicine

## 2020-09-26 ENCOUNTER — Observation Stay: Payer: Medicare (Managed Care)

## 2020-09-26 DIAGNOSIS — Z9889 Other specified postprocedural states: Secondary | ICD-10-CM | POA: Diagnosis not present

## 2020-09-26 DIAGNOSIS — R55 Syncope and collapse: Secondary | ICD-10-CM | POA: Diagnosis present

## 2020-09-26 DIAGNOSIS — Z7984 Long term (current) use of oral hypoglycemic drugs: Secondary | ICD-10-CM | POA: Diagnosis not present

## 2020-09-26 DIAGNOSIS — I10 Essential (primary) hypertension: Secondary | ICD-10-CM | POA: Diagnosis present

## 2020-09-26 DIAGNOSIS — Y92410 Unspecified street and highway as the place of occurrence of the external cause: Secondary | ICD-10-CM | POA: Diagnosis not present

## 2020-09-26 DIAGNOSIS — E119 Type 2 diabetes mellitus without complications: Secondary | ICD-10-CM | POA: Diagnosis present

## 2020-09-26 DIAGNOSIS — Z87442 Personal history of urinary calculi: Secondary | ICD-10-CM | POA: Diagnosis not present

## 2020-09-26 DIAGNOSIS — Z8249 Family history of ischemic heart disease and other diseases of the circulatory system: Secondary | ICD-10-CM | POA: Diagnosis not present

## 2020-09-26 DIAGNOSIS — E785 Hyperlipidemia, unspecified: Secondary | ICD-10-CM | POA: Diagnosis present

## 2020-09-26 DIAGNOSIS — M545 Low back pain, unspecified: Secondary | ICD-10-CM | POA: Diagnosis present

## 2020-09-26 DIAGNOSIS — Z79899 Other long term (current) drug therapy: Secondary | ICD-10-CM | POA: Diagnosis not present

## 2020-09-26 DIAGNOSIS — Z79891 Long term (current) use of opiate analgesic: Secondary | ICD-10-CM | POA: Diagnosis not present

## 2020-09-26 DIAGNOSIS — Z91048 Other nonmedicinal substance allergy status: Secondary | ICD-10-CM | POA: Diagnosis not present

## 2020-09-26 DIAGNOSIS — Z981 Arthrodesis status: Secondary | ICD-10-CM | POA: Diagnosis not present

## 2020-09-26 DIAGNOSIS — E876 Hypokalemia: Secondary | ICD-10-CM | POA: Diagnosis present

## 2020-09-26 DIAGNOSIS — I959 Hypotension, unspecified: Secondary | ICD-10-CM | POA: Diagnosis present

## 2020-09-26 DIAGNOSIS — G8929 Other chronic pain: Secondary | ICD-10-CM | POA: Diagnosis present

## 2020-09-26 DIAGNOSIS — K219 Gastro-esophageal reflux disease without esophagitis: Secondary | ICD-10-CM | POA: Diagnosis present

## 2020-09-26 DIAGNOSIS — M159 Polyosteoarthritis, unspecified: Secondary | ICD-10-CM | POA: Diagnosis present

## 2020-09-26 DIAGNOSIS — M4326 Fusion of spine, lumbar region: Secondary | ICD-10-CM | POA: Diagnosis not present

## 2020-09-26 DIAGNOSIS — Z20822 Contact with and (suspected) exposure to covid-19: Secondary | ICD-10-CM | POA: Diagnosis present

## 2020-09-26 DIAGNOSIS — Z87891 Personal history of nicotine dependence: Secondary | ICD-10-CM | POA: Diagnosis not present

## 2020-09-26 DIAGNOSIS — D649 Anemia, unspecified: Secondary | ICD-10-CM | POA: Diagnosis present

## 2020-09-26 DIAGNOSIS — G894 Chronic pain syndrome: Secondary | ICD-10-CM | POA: Diagnosis not present

## 2020-09-26 LAB — BASIC METABOLIC PANEL
Anion gap: 8 (ref 5–15)
BUN: 18 mg/dL (ref 8–23)
CO2: 24 mmol/L (ref 22–32)
Calcium: 8.8 mg/dL — ABNORMAL LOW (ref 8.9–10.3)
Chloride: 104 mmol/L (ref 98–111)
Creatinine, Ser: 0.87 mg/dL (ref 0.61–1.24)
GFR, Estimated: >60 mL/min (ref >60)
Glucose, Bld: 102 mg/dL — ABNORMAL HIGH (ref 70–99)
Potassium: 3.4 mmol/L — ABNORMAL LOW (ref 3.5–5.1)
Sodium: 136 mmol/L (ref 135–145)

## 2020-09-26 LAB — HEMOGLOBIN A1C
Hgb A1c MFr Bld: 6 % — ABNORMAL HIGH (ref 4.8–5.6)
Mean Plasma Glucose: 126 mg/dL

## 2020-09-26 LAB — CBC
HCT: 41 % (ref 39.0–52.0)
Hemoglobin: 13.6 g/dL (ref 13.0–17.0)
MCH: 29.6 pg (ref 26.0–34.0)
MCHC: 33.2 g/dL (ref 30.0–36.0)
MCV: 89.3 fL (ref 80.0–100.0)
Platelets: 437 10*3/uL — ABNORMAL HIGH (ref 150–400)
RBC: 4.59 MIL/uL (ref 4.22–5.81)
RDW: 14 % (ref 11.5–15.5)
WBC: 11.2 10*3/uL — ABNORMAL HIGH (ref 4.0–10.5)
nRBC: 0 % (ref 0.0–0.2)

## 2020-09-26 LAB — GLUCOSE, CAPILLARY
Glucose-Capillary: 99 mg/dL (ref 70–99)
Glucose-Capillary: 93 mg/dL (ref 70–99)
Glucose-Capillary: 130 mg/dL — ABNORMAL HIGH (ref 70–99)
Glucose-Capillary: 127 mg/dL — ABNORMAL HIGH (ref 70–99)

## 2020-09-26 LAB — C-REACTIVE PROTEIN: CRP: 3.2 mg/dL — ABNORMAL HIGH (ref <1.0)

## 2020-09-26 MED ORDER — CELECOXIB 200 MG PO CAPS
400.0000 mg | ORAL_CAPSULE | Freq: Two times a day (BID) | ORAL | Status: DC
  Administered 2020-09-26 – 2020-09-27 (×2): 400 mg via ORAL
  Filled 2020-09-26 (×2): qty 2

## 2020-09-26 MED ORDER — IOHEXOL 300 MG/ML  SOLN
100.0000 mL | Freq: Once | INTRAMUSCULAR | Status: AC | PRN
  Administered 2020-09-26: 100 mL via INTRAVENOUS

## 2020-09-26 MED ORDER — FENTANYL 25 MCG/HR TD PT72
1.0000 | MEDICATED_PATCH | TRANSDERMAL | Status: DC
  Administered 2020-09-26: 1 via TRANSDERMAL
  Filled 2020-09-26: qty 1

## 2020-09-26 MED ORDER — CELECOXIB 200 MG PO CAPS
200.0000 mg | ORAL_CAPSULE | Freq: Once | ORAL | Status: AC
  Administered 2020-09-26: 200 mg via ORAL
  Filled 2020-09-26: qty 1

## 2020-09-26 MED ORDER — PANTOPRAZOLE SODIUM 40 MG PO TBEC
40.0000 mg | DELAYED_RELEASE_TABLET | Freq: Every day | ORAL | Status: DC
  Administered 2020-09-26 – 2020-09-27 (×2): 40 mg via ORAL
  Filled 2020-09-26 (×2): qty 1

## 2020-09-26 MED ORDER — POTASSIUM CHLORIDE CRYS ER 20 MEQ PO TBCR
40.0000 meq | EXTENDED_RELEASE_TABLET | Freq: Once | ORAL | Status: AC
  Administered 2020-09-26: 40 meq via ORAL
  Filled 2020-09-26 (×2): qty 2

## 2020-09-26 NOTE — Evaluation (Signed)
Occupational Therapy Evaluation Patient Details Name: Howard Murphy MRN: [id number] DOB: 1956/03/26 Today's Date: 09/26/2020    History of Present Illness Per MD notes: Pt is a 65 year old male with history of chronic low back pain, history of multiple back surgeries, type 2 diabetes mellitus, nephrolithiasis, hypertension, dyslipidemia who underwent lumbar spinal decompression and T10 fusion on 4/6, subsequently discharged home on 4/9.  MD assessment includes: syncope with fall and severe low back pain.   Clinical Impression   Patient presenting with decreased I in self care, balance, functional mobility/transfer, endurance, and safety awareness.  Patient reports living at home with significant other PTA. He has been using RW at home for functional mobility and was independent with self care tasks prior to surgery. Pt has been needing assist with LB self care once returning home. OT continued education on back precautions as well as figure four position for LB self care. Pt returning demonstration with min guard. Pt reports 8/10 pain while in bed and while ambulating 150' with min guard. Patient will benefit from acute OT to increase overall independence in the areas of ADLs, functional mobility, and safety awareness in order to safely discharge home with family.    Follow Up Recommendations  Supervision - Intermittent;No OT follow up    Equipment Recommendations  None recommended by OT       Precautions / Restrictions Precautions Precautions: Fall;Back Restrictions Other Position/Activity Restrictions: Per Dr. Izora Ribas pt does not require his TLSO to participate with rehab services      Mobility Bed Mobility Overal bed mobility: Needs Assistance Bed Mobility: Rolling;Sidelying to Sit;Sit to Sidelying Rolling: Supervision Sidelying to sit: Supervision Supine to sit: Min assist Sit to supine: Mod assist Sit to sidelying: Supervision General bed mobility comments: cuing for log  roll technique and pt needing assist with trunk for supine>sit and assist with B LEs sit >supine/.    Transfers Overall transfer level: Needs assistance Equipment used: Rolling walker (2 wheeled) Transfers: Sit to/from [facility] Sit to Stand: Min guard Stand pivot transfers: Min guard       General transfer comment: Min verbal cues for hand placement    Balance Overall balance assessment: Needs assistance Sitting-balance support: Bilateral upper extremity supported;Feet supported Sitting balance-Leahy Scale: Good     Standing balance support: During functional activity;Bilateral upper extremity supported Standing balance-Leahy Scale: Fair Standing balance comment: Min lean on the RW for support                           ADL either performed or assessed with clinical judgement   ADL Overall ADL's : Needs assistance/impaired     Grooming: Wash/dry face;Oral care;Supervision/safety;Standing               Lower Body Dressing: Minimal assistance Lower Body Dressing Details (indicate cue type and reason): cuing for figure four position and able to demonstrate to therapist. Toilet Transfer: Min Fish farm manager Details (indicate cue type and reason): simulated         Functional mobility during ADLs: Min guard;Rolling walker       Vision Patient Visual Report: No change from baseline              Pertinent Vitals/Pain Pain Assessment: No/denies pain Pain Score: 8  Pain Location: Low back Pain Descriptors / Indicators: Sore;Aching;Discomfort Pain Intervention(s): Limited activity within patient's tolerance;Monitored during session;Repositioned     Hand Dominance Right   Extremity/Trunk Assessment Upper Extremity  Assessment Upper Extremity Assessment: Overall WFL for tasks assessed   Lower Extremity Assessment Lower Extremity Assessment: Overall WFL for tasks assessed       Communication  Communication Communication: No difficulties   Cognition Arousal/Alertness: Awake/alert Behavior During Therapy: WFL for tasks assessed/performed Overall Cognitive Status: Within Functional Limits for tasks assessed                                 General Comments: Pt is A and Ox 4. Pleasant and cooperative.   General Comments       Exercises Other Exercises Other Exercises: Log roll training/review        Home Living Family/patient expects to be discharged to:: Private residence Living Arrangements: Spouse/significant other Available Help at Discharge: Family;Available 24 hours/day Type of Home: House Home Access: Stairs to enter [employer] of Steps: 3 Entrance Stairs-Rails: Right;Left;Can reach both Home Layout: One level     Bathroom Shower/Tub: Teacher, early years/pre: Standard     Home Equipment: Environmental consultant - 2 wheels;Cane - single point;Wheelchair - manual          Prior Functioning/Environment Level of Independence: Needs assistance  Gait / Transfers Assistance Needed: mod I with SPC at home ADL's / Homemaking Assistance Needed: Pt reports being I in self care tasks but has needed assist with LB self care secondary to back pain. Communication / Swallowing Assistance Needed: Pt typically independent with ADLs, however intermittently recieving assistance for LB dressing/bathing d/t back pain. Pt is a retired Games developer and occassionally works. Pt's fiance assists with IADLs. Comments: He is a retired Games developer and woks occasionally.        OT Problem List: Decreased range of motion;Decreased activity tolerance;Impaired balance (sitting and/or standing);Decreased knowledge of precautions;Pain;Decreased knowledge of use of DME or AE;Decreased safety awareness      OT Treatment/Interventions: Self-care/ADL training;Therapeutic exercise;Therapeutic activities;Patient/family education;Balance training;DME and/or AE instruction    OT  Goals(Current goals can be found in the care plan section) Acute Rehab OT Goals Patient Stated Goal: Decreased back pain OT Goal Formulation: With patient/family Time For Goal Achievement: 10/10/20 Potential to Achieve Goals: Good ADL Goals Pt Will Perform Grooming: with modified independence;standing Pt Will Perform Lower Body Dressing: with modified independence;sit to/from stand Pt Will Transfer to Toilet: with modified independence;ambulating Pt Will Perform Toileting - Clothing Manipulation and hygiene: with modified independence;sit to/from stand  OT Frequency: Min 1X/week   Barriers to D/C:    none known at this time          AM-PAC OT "6 Clicks" Daily Activity     Outcome Measure Help from another person eating meals?: None Help from another person taking care of personal grooming?: A Little Help from another person toileting, which includes using toliet, bedpan, or urinal?: A Little Help from another person bathing (including washing, rinsing, drying)?: A Little Help from another person to put on and taking off regular upper body clothing?: A Little Help from another person to put on and taking off regular lower body clothing?: A Little 6 Click Score: 19   End of Session Equipment Utilized During Treatment: Rolling walker Nurse Communication: Mobility status  Activity Tolerance: Patient tolerated treatment well Patient left: in bed;with call bell/phone within reach;with bed alarm set  OT Visit Diagnosis: Unsteadiness on feet (R26.81);Pain Pain - Right/Left:  (back)                Time:  8685-4883 OT Time Calculation (min): 22 min Charges:  OT General Charges $OT Visit: 1 Visit OT Evaluation $OT Eval Low Complexity: 1 Low OT Treatments $Self Care/Home Management : 8-22 mins  Darleen Crocker, MS, OTR/L , CBIS ascom [phone]  09/26/20, 4:28 PM

## 2020-09-26 NOTE — Progress Notes (Addendum)
Progress Note   Date: 09/26/2020  Mr Perleberg is POD#12 from Extension of fusion to T10, L1/2 decompression  Subjective: He continues to have back pain today but denies any leg pain or numbness. He was NPO for possible surgery today and has had CT of the spine completed.     Vital Signs: Temp:  [97.9 F (36.6 C)-98.4 F (36.9 C)] 97.9 F (36.6 C) (04/18 0832) Pulse Rate:  [65-87] 72 (04/18 0832) Resp:  [16-20] 17 (04/18 0832) BP: (97-121)/(47-79) 103/69 (04/18 0832) SpO2:  [94 %-99 %] 97 % (04/18 0832) Temp (24hrs), Avg:98.1 F (36.7 C), Min:97.9 F (36.6 C), Max:98.4 F (36.9 C)  Weight: 81.6 kg @IOLAST2SHIFTS @  Problem List Patient Active Problem List   Diagnosis Date Noted  . Syncope 09/24/2020  . Lumbar adjacent segment disease with spondylolisthesis 09/14/2020  . Uncomplicated opioid dependence ([place]) 02/20/2020  . DDD (degenerative disc disease), lumbar 02/03/2020  . Grade 1 Retrolisthesis of L1/L2 01/05/2020  . Lumbar disc extrusion with 7 mm caudal migration (L1-2) 01/05/2020  . Lumbosacral radiculopathy at L1 (Right) 01/05/2020  . Groin pain (Right) 01/05/2020  . Neural foraminal stenosis of lumbar spine (L1-2) (Right) 01/05/2020  . DDD (degenerative disc disease), lumbosacral 01/05/2020  . Acute exacerbation of chronic low back pain 12/30/2019  . Pharmacologic therapy 11/25/2019  . Disorder of skeletal system 11/25/2019  . Problems influencing health status 11/25/2019  . Osteoarthritis involving multiple joints 02/23/2019  . Chronic musculoskeletal pain 02/23/2019  . Insomnia secondary to chronic pain 02/23/2019  . Neurogenic claudication ([place]) 04/02/2018  . Chronic bilateral low back pain with bilateral sciatica 03/05/2018  . Spondylosis without myelopathy or radiculopathy, lumbosacral region 01/14/2018  . Other specified dorsopathies, sacral and sacrococcygeal region 12/26/2017  . Acute appendicitis 12/18/2017  . Medication monitoring encounter  03/07/2017  . 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 Spondylolisthesis at L3-L4 level 08/03/2016  . Chronic pain syndrome 07/09/2016  . Encounter for medication monitoring 02/24/2016  . Cervical spondylosis 12/17/2015  . Cervical facet arthropathy 12/17/2015  . Chronic neck pain (Bilateral) (R>L) 12/17/2015  . Cervical facet syndrome (Bilateral) (R>L) 12/17/2015  . Chronic upper extremity pain (Bilateral) (R>L) 12/17/2015  . Cervical foraminal stenosis 12/17/2015  .  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 Arthropathy of shoulder (Right) 12/17/2015  . 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 Lumbar foraminal stenosis  (Left: L4-5) (Right: L1-2) 12/17/2015  . Grade 1 Anterolisthesis (2 mm) of L4 over L5. 12/17/2015  . Lumbar facet (L4-5) synovial cyst (6 mm) (Left) 12/17/2015  . Chronic sacroiliac joint pain (Bilateral) (L>R) 12/08/2015  . Neurogenic pain 12/08/2015  . Abnormal MRI, lumbar spine (12/23/2019) 11/21/2015  . Radicular pain of shoulder (Right) 10/03/2015  . Carpal tunnel syndrome (Bilateral) 10/03/2015  . Chronic cervical radicular pain (Right) 10/03/2015  . Chronic low back pain (1ry area of Pain) (Bilateral) (L>R) w/o sciatica 09/06/2015  . Opiate use (60 MME/Day) 09/05/2015  . Long term prescription opiate use 09/05/2015  . Long term current use of opiate analgesic 09/05/2015  . Spondylosis of lumbar spine 09/05/2015  . Lumbar facet joint syndrome ([place]) 09/05/2015  . Chronic hip pain (Left) 09/05/2015  . Diabetic peripheral neuropathy ([place]) 09/05/2015  . Chronic lumbar radicular pain (S1 Dermatome) (Bilateral) (L>R) 09/05/2015  . Hypokalemia 09/05/2015  . Chronic shoulder pain (2ry area of Pain) (Bilateral) (L>R) 09/05/2015  . Diabetes ([place]) 09/05/2015  . Chronic foot pain (3ry  area of Pain) (Bilateral) (L>R) 09/05/2015  . Chronic hand pain (4th area  of Pain) (Bilateral) (R>L) 09/05/2015  . Lumbar facet hypertrophy 09/05/2015  . Encounter for therapeutic drug level monitoring 09/05/2015  . Low testosterone 09/05/2015  . Occipital pain (Right) 09/05/2015  . 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 Hypertriglyceridemia 07/26/2015  . Chronic abdominal pain (RUQ) 07/14/2015  . Type 2 diabetes mellitus ([place]) 07/14/2015  . Chest pain 07/14/2015  . Complete tear of the shoulder rotator cuff (Left) 05/10/2015  . Complete tear of left rotator cuff 05/10/2015  . SOB (shortness of breath) 03/22/2015  . Elevated hemoglobin ([place]) 07/07/2013  . History of lumbar facet Synovial cyst, surgically removed. 05/29/2013  . 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 GERD (gastroesophageal reflux disease) 05/05/2013  . Benign prostatic hyperplasia 05/05/2013  . Essential hypertension 05/08/2011  . Hyperlipidemia 05/08/2011    Medications: Scheduled Meds: . atorvastatin  40 mg Oral QHS  . celecoxib  200 mg Oral Once  . celecoxib  400 mg Oral Q12H  . cyclobenzaprine  7.5 mg Oral TID  . empagliflozin  25 mg Oral Daily  . fentaNYL  1 patch Transdermal Q72H  . insulin aspart  0-9 Units Subcutaneous TID PC & HS  . lidocaine  1 patch Transdermal Q24H  . linagliptin  5 mg Oral Daily  . pantoprazole  40 mg Oral Daily  . sodium chloride flush  3 mL Intravenous Q12H  . tamsulosin  0.4 mg Oral Daily  . zolpidem  5 mg Oral QHS   Continuous Infusions: PRN Meds:.acetaminophen **OR** acetaminophen, HYDROmorphone (DILAUDID) injection, magnesium hydroxide, ondansetron **OR** ondansetron (ZOFRAN) IV, oxyCODONE-acetaminophen **AND** oxyCODONE, traZODone  Labs:  Lab Results  Component Value Date   WBC 11.2 (H) 09/26/2020   WBC 12.7 (H) 09/25/2020   HCT 41.0 09/26/2020   HCT 40.5 09/25/2020   HCT 49.4 07/15/2015   HCT 55.9 (H) 03/23/2014   HCT 45.5 05/23/2012   PLT 437 (H) 09/26/2020   PLT 456 (H) 09/25/2020   PLT 300 07/15/2015   PLT 218  03/23/2014   PLT 246 05/23/2012    Lab Results  Component Value Date   INR 1.1 09/02/2020   APTT 28 09/02/2020   Lab Results  Component Value Date   NA 136 09/26/2020   NA 140 09/25/2020   NA 142 03/07/2017   NA 138 11/29/2016   NA 135 (L) 03/23/2014   NA 139 05/23/2012   K 3.4 (L) 09/26/2020   K 4.0 09/25/2020   K 2.8 (L) 03/23/2014   K 3.2 (L) 05/23/2012   BUN 18 09/26/2020   BUN 14 09/25/2020   BUN 14 03/07/2017   BUN 12 11/29/2016   BUN 16 03/23/2014   BUN 8 05/23/2012    Lab Results  Component Value Date   MG 1.8 09/25/2020   Exam: Back: Incision well approximated with some eschar, no drainage Neuro: 5/5 in bilateral lower extremities Sensation intact in lower extremities    CT Lumbar Spine: Interval extension of the patient's posterior fusion superiorly to the T11 level. Expected postoperative changes in the posterior spinal soft tissues without evidence of an abscess.   Assessment/Plan: Mr Dombek is POD#12 from Extension of fusion to T10, L1/2 decompression  - No surgery needed at this time, patient is OK to eat - Recommend PT/OT - continued pain control  Deetta Perla, MD

## 2020-09-26 NOTE — Evaluation (Signed)
Physical Therapy Evaluation Patient Details Name: Howard Murphy MRN: [id number] DOB: 11-24-1955 Today's Date: 09/26/2020   History of Present Illness  Per MD notes: Pt is a 65 year old male with history of chronic low back pain, history of multiple back surgeries, type 2 diabetes mellitus, nephrolithiasis, hypertension, dyslipidemia who underwent lumbar spinal decompression and T10 fusion on 4/6, subsequently discharged home on 4/9.  MD assessment includes: syncope with fall and severe low back pain.    Clinical Impression  Pt was pleasant and motivated to participate during the session despite reporting severe low back pain.  Pt did not have his TLSO with him this admission with Dr. Joretta Bachelor contacted and stated pt does not require his brace to participate with PT services.  Pt required extra time and effort during log roll training but did not require physical assistance.  Pt was generally steady with transfers and gait with min cues for proper sequencing and was able to ascend/descend 4 steps with B rails with cues for hand placement on rails and use of RW for last step descending.  Pt will benefit from OPPT upon discharge to safely address deficits listed in patient problem list for decreased caregiver assistance and eventual return to PLOF.       Follow Up Recommendations Outpatient PT;Follow surgeon's recommendation for DC plan and follow-up therapies    Equipment Recommendations  None recommended by PT    Recommendations for Other Services       Precautions / Restrictions Precautions Precautions: Fall;Back Restrictions Other Position/Activity Restrictions: Per Dr. Izora Ribas pt does not require his TLSO to participate with rehab services      Mobility  Bed Mobility Overal bed mobility: Needs Assistance Bed Mobility: Rolling;Sidelying to Sit;Sit to Sidelying Rolling: Supervision Sidelying to sit: Supervision     Sit to sidelying: Supervision General bed mobility comments:  Log roll training/review with min verbal cues for sequencing    Transfers Overall transfer level: Needs assistance Equipment used: Rolling walker (2 wheeled) Transfers: Sit to/from Stand Sit to Stand: Min guard         General transfer comment: Min verbal cues for hand placement  Ambulation/Gait Ambulation/Gait assistance: Min guard Gait Distance (Feet): 100 Feet Assistive device: Rolling walker (2 wheeled) Gait Pattern/deviations: Step-through pattern Gait velocity: decreased   General Gait Details: Mildy decreased cadence but steady without LOB  Stairs Stairs: Yes Stairs assistance: Min guard Stair Management: Two rails;Alternating pattern Number of Stairs: 4 General stair comments: Slow cadence but steady with min verbal cues for proper hand placement on rails and management of RW for the last step while descending  Wheelchair Mobility    Modified Rankin (Stroke Patients Only)       Balance Overall balance assessment: Needs assistance Sitting-balance support: Bilateral upper extremity supported;Feet supported Sitting balance-Leahy Scale: Good     Standing balance support: During functional activity;Bilateral upper extremity supported Standing balance-Leahy Scale: Good Standing balance comment: Min lean on the RW for support                             Pertinent Vitals/Pain Pain Assessment: 0-10 Pain Score: 10-Worst pain ever Pain Location: Low back Pain Descriptors / Indicators: Sore;Aching Pain Intervention(s): Premedicated before session;Monitored during session;Repositioned    Home Living Family/patient expects to be discharged to:: Private residence Living Arrangements: Spouse/significant other Available Help at Discharge: Family;Available 24 hours/day Type of Home: House Home Access: Stairs to enter Entrance Stairs-Rails: Right;Left;Can reach both Entrance  Stairs-Number of Steps: 3 Home Layout: One level Home Equipment: [place] - 2  wheels;Cane - single point;Wheelchair - manual      Prior Function Level of Independence: Needs assistance      ADL's / Homemaking Assistance Needed: Mod Ind amb with a SPC, no other fall Hx other than current fall caused by syncope        Hand Dominance        Extremity/Trunk Assessment   Upper Extremity Assessment Upper Extremity Assessment: Overall WFL for tasks assessed    Lower Extremity Assessment Lower Extremity Assessment: Overall WFL for tasks assessed       Communication   Communication: No difficulties  Cognition Arousal/Alertness: Awake/alert Behavior During Therapy: WFL for tasks assessed/performed Overall Cognitive Status: Within Functional Limits for tasks assessed                                        General Comments      Exercises Other Exercises Other Exercises: Log roll training/review   Assessment/Plan    PT Assessment Patient needs continued PT services  PT Problem List Decreased mobility;Decreased activity tolerance;Pain;Decreased balance       PT Treatment Interventions Gait training;Stair training;Functional mobility training;Therapeutic activities;Patient/family education;DME instruction;Therapeutic exercise;Balance training    PT Goals (Current goals can be found in the Care Plan section)  Acute Rehab PT Goals Patient Stated Goal: Decreased back pain PT Goal Formulation: With patient Time For Goal Achievement: 10/09/20 Potential to Achieve Goals: Fair    Frequency 7X/week   Barriers to discharge        Co-evaluation               AM-PAC PT "6 Clicks" Mobility  Outcome Measure Help needed turning from your back to your side while in a flat bed without using bedrails?: A Little Help needed moving from lying on your back to sitting on the side of a flat bed without using bedrails?: A Little Help needed moving to and from a bed to a chair (including a wheelchair)?: A Little Help needed standing up  from a chair using your arms (e.g., wheelchair or bedside chair)?: A Little Help needed to walk in hospital room?: A Little Help needed climbing 3-5 steps with a railing? : A Little 6 Click Score: 18    End of Session Equipment Utilized During Treatment: Gait belt Activity Tolerance: Patient tolerated treatment well;No increased pain Patient left: in bed;with call bell/phone within reach;with bed alarm set Nurse Communication: Mobility status PT Visit Diagnosis: Pain;Difficulty in walking, not elsewhere classified (R26.2) Pain - part of body:  (low back pain)    Time: 4098-1191 PT Time Calculation (min) (ACUTE ONLY): 37 min   Charges:   PT Evaluation $PT Eval Moderate Complexity: 1 Mod PT Treatments $Gait Training: 8-22 mins        D. Royetta Asal PT, DPT 09/26/20, 2:21 PM

## 2020-09-26 NOTE — Progress Notes (Signed)
PROGRESS NOTE    Howard Murphy  TTS:[id number] DOB: [birth year] DOA: 09/24/2020 [facility]CP: [facility]  Brief Narrative: 65 year old male with history of chronic low back pain, history of multiple back surgeries, type 2 diabetes mellitus, nephrolithiasis, hypertension, dyslipidemia underwent lumbar spinal decompression by Dr. Cari Caraway on 4/6, subsequently discharged home on 4/9 on oral Dilaudid in addition to his chronic Percocets. -Patient reports severe 10/10 pain since then, barely able to move, yesterday he tried to walk a few steps to the bathroom, subsequently was found down by his fiance and brought to the ED 4/16.  -The ED was noted to be hypotensive with SBP of 82 have mild hypokalemia, mild leukocytosis, WBC of 11.8   Assessment & Plan:   Syncope -Most likely secondary to hypotension, pain etc, no events on telemetry -Blood pressure improved after fluid bolus -He is on high-dose chlorthalidone and lisinopril at baseline which are on hold -2D echo was unremarkable -Physical therapy today, increase activity -Ambulate as tolerated  Severe low back pain, Fall/syncope Recent lumbar decompression 4/6 Chronic back pain with multiple surgeries -Neurosurgery Dr. Cari Caraway following, he was unable to have an MRI yesterday, CT notable for expected postop findings -Starting to improve -Continue fentanyl patch will decrease dose, increase Celebrex, continue lidocaine patch and scheduled Flexeril -PRN Dilaudid for severe pain only -Increase activity, physical therapy today  Type 2 diabetes mellitus -CBGs are stable, continue Januvia and Jardiance  DVT prophylaxis: Hold Lovenox pending MRI Code Status: Full code Family Communication: No family at bedside Disposition Plan:  Status is: Observation  The patient will require care spanning > 2 midnights and should be moved to inpatient because: Inpatient level of care appropriate due to severity of illness  Dispo: The  patient is from: Home              Anticipated d/c is to: Home              Patient currently is not medically stable to d/c.   Difficult to place patient No  Consultants:   Neurosurgery Dr. Cari Caraway   Procedures:   Antimicrobials:    Subjective: -Feels a little better today, was unable to have his MRI done yesterday, the board/bed in the scanner was too stiff and patient was unable to lay on this -He feels like this regimen of medications is helping him  Objective: Vitals:   09/26/20 0419 09/26/20 0422 09/26/20 0424 09/26/20 0832  BP: 97/63 101/71 104/70 103/69  Pulse: 69 75 87 72  Resp: 16 19 18 17   Temp: 98.2 F (36.8 C) 98.2 F (36.8 C) 98.1 F (36.7 C) 97.9 F (36.6 C)  TempSrc: Oral Oral Oral   SpO2: 94% 94% 96% 97%  Weight:      Height:        Intake/Output Summary (Last 24 hours) at 09/26/2020 1140 Last data filed at 09/26/2020 0843 Gross per 24 hour  Intake 223.55 ml  Output 800 ml  Net -576.45 ml   Filed Weights   09/24/20 1912  Weight: 81.6 kg    Examination:  General exam: Chronically ill-appearing male laying in bed, AAOx3, appears slightly more comfortable than yesterday HEENT: No JVD CVS: S1-S2, regular rhythm Lungs: Poor air movement at the bases, otherwise clear Abdomen: Soft, nontender, nondistended, bowel sounds present Back with extensive linear surgical wound healing well with scab Neuro: Moves all extremities, no localizing signs Extremities: No edema  Data Reviewed:   CBC: Recent Labs  Lab 09/24/20 1922 09/25/20 0500  09/25/20 1227 09/26/20 0401  WBC 11.9* 11.8* 12.7* 11.2*  NEUTROABS 8.3*  --   --   --   HGB 12.0* 13.2 13.6 13.6  HCT 37.4* 39.3 40.5 41.0  MCV 92.3 90.8 89.2 89.3  PLT 383 400 456* 431*   Basic Metabolic Panel: Recent Labs  Lab 09/24/20 1922 09/25/20 0500 09/25/20 0506 09/26/20 0401  NA 138 140  --  136  K 3.3* 4.0  --  3.4*  CL 108 110  --  104  CO2 23 23  --  24  GLUCOSE 136* 151*  --  102*   BUN 17 14  --  18  CREATININE 1.40* 1.03  --  0.87  CALCIUM 8.4* 8.4*  --  8.8*  MG  --   --  1.8  --    GFR: Estimated Creatinine Clearance: 86.6 mL/min (by C-G formula based on SCr of 0.87 mg/dL). Liver Function Tests: No results for input(s): AST, ALT, ALKPHOS, BILITOT, PROT, ALBUMIN in the last 168 hours. No results for input(s): LIPASE, AMYLASE in the last 168 hours. No results for input(s): AMMONIA in the last 168 hours. Coagulation Profile: No results for input(s): INR, PROTIME in the last 168 hours. Cardiac Enzymes: No results for input(s): CKTOTAL, CKMB, CKMBINDEX, TROPONINI in the last 168 hours. BNP (last 3 results) No results for input(s): PROBNP in the last 8760 hours. HbA1C: Recent Labs    09/25/20 0506  HGBA1C 6.0*   CBG: Recent Labs  Lab 09/25/20 0825 09/25/20 1222 09/25/20 1730 09/25/20 2045 09/26/20 0831  GLUCAP 120* 111* 89 112* 99   Lipid Profile: No results for input(s): CHOL, HDL, LDLCALC, TRIG, CHOLHDL, LDLDIRECT in the last 72 hours. Thyroid Function Tests: No results for input(s): TSH, T4TOTAL, FREET4, T3FREE, THYROIDAB in the last 72 hours. Anemia Panel: No results for input(s): VITAMINB12, FOLATE, FERRITIN, TIBC, IRON, RETICCTPCT in the last 72 hours. Urine analysis:    Component Value Date/Time   COLORURINE STRAW (A) 09/24/2020 1922   APPEARANCEUR CLEAR (A) 09/24/2020 1922   APPEARANCEUR Clear 08/15/2017 1347   LABSPEC 1.003 (L) 09/24/2020 1922   LABSPEC 1.015 03/23/2014 2003   PHURINE 7.0 09/24/2020 1922   GLUCOSEU >=500 (A) 09/24/2020 1922   GLUCOSEU 50 mg/dL 03/23/2014 2003   HGBUR NEGATIVE 09/24/2020 [place] NEGATIVE 09/24/2020 1922   BILIRUBINUR Negative 08/15/2017 1347   BILIRUBINUR Negative 03/23/2014 2003   KETONESUR NEGATIVE 09/24/2020 [place] NEGATIVE 09/24/2020 1922   UROBILINOGEN 1.0 02/23/2015 2347   NITRITE NEGATIVE 09/24/2020 1922   LEUKOCYTESUR NEGATIVE 09/24/2020 1922   LEUKOCYTESUR Negative  03/23/2014 2003   Sepsis Labs: @LABRCNTIP (procalcitonin:4,lacticidven:4)  ) Recent Results (from the past 240 hour(s))  Resp Panel by RT-PCR (Flu A&B, Covid) Nasopharyngeal Swab     Status: None   Collection Time: 09/25/20 12:38 AM   Specimen: Nasopharyngeal Swab; Nasopharyngeal(NP) swabs in vial transport medium  Result Value Ref Range Status   SARS Coronavirus 2 by RT PCR NEGATIVE NEGATIVE Final    Comment: (NOTE) SARS-CoV-2 target nucleic acids are NOT DETECTED.  The SARS-CoV-2 RNA is generally detectable in upper respiratory specimens during the acute phase of infection. The lowest concentration of SARS-CoV-2 viral copies this assay can detect is 138 copies/mL. A negative result does not preclude SARS-Cov-2 infection and should not be used as the sole basis for treatment or other patient management decisions. A negative result may occur with  improper specimen collection/handling, submission of specimen other than nasopharyngeal swab, presence of viral  mutation(s) within the areas targeted by this assay, and inadequate number of viral copies(<138 copies/mL). A negative result must be combined with clinical observations, patient history, and epidemiological information. The expected result is Negative.  Fact Sheet for Patients:  EntrepreneurPulse.com.au  Fact Sheet for Healthcare Providers:  IncredibleEmployment.be  This test is no t yet approved or cleared by the [place] FDA and  has been authorized for detection and/or diagnosis of SARS-CoV-2 by FDA under an Emergency Use Authorization (EUA). This EUA will remain  in effect (meaning this test can be used) for the duration of the COVID-19 declaration under Section 564(b)(1) of the Act, 21 U.S.C.section 360bbb-3(b)(1), unless the authorization is terminated  or revoked sooner.       Influenza A by PCR NEGATIVE NEGATIVE Final   Influenza B by PCR NEGATIVE NEGATIVE Final     Comment: (NOTE) The Xpert Xpress SARS-CoV-2/FLU/RSV plus assay is intended as an aid in the diagnosis of influenza from Nasopharyngeal swab specimens and should not be used as a sole basis for treatment. Nasal washings and aspirates are unacceptable for Xpert Xpress SARS-CoV-2/FLU/RSV testing.  Fact Sheet for Patients: EntrepreneurPulse.com.au  Fact Sheet for Healthcare Providers: IncredibleEmployment.be  This test is not yet approved or cleared by the [place] FDA and has been authorized for detection and/or diagnosis of SARS-CoV-2 by FDA under an Emergency Use Authorization (EUA). This EUA will remain in effect (meaning this test can be used) for the duration of the COVID-19 declaration under Section 564(b)(1) of the Act, 21 U.S.C. section 360bbb-3(b)(1), unless the authorization is terminated or revoked.  Performed at [facility], [address] [place], [place] [address]      Scheduled Meds: . atorvastatin  40 mg Oral QHS  . celecoxib  200 mg Oral Once  . celecoxib  400 mg Oral Q12H  . cyclobenzaprine  7.5 mg Oral TID  . empagliflozin  25 mg Oral Daily  . fentaNYL  1 patch Transdermal Q72H  . insulin aspart  0-9 Units Subcutaneous TID PC & HS  . lidocaine  1 patch Transdermal Q24H  . linagliptin  5 mg Oral Daily  . sodium chloride flush  3 mL Intravenous Q12H  . tamsulosin  0.4 mg Oral Daily  . zolpidem  5 mg Oral QHS   Continuous Infusions:    LOS: 0 days   Time spent: 64min  Domenic Polite, MD [facility]  09/26/2020, 11:40 AM

## 2020-09-26 NOTE — Plan of Care (Signed)
  Problem: Education: Goal: Knowledge of General Education information will improve Description: Including pain rating scale, medication(s)/side effects and non-pharmacologic comfort measures Outcome: Progressing   Problem: Clinical Measurements: Goal: Will remain free from infection Outcome: Progressing   Problem: Activity: Goal: Risk for activity intolerance will decrease Outcome: Progressing   Problem: Coping: Goal: Level of anxiety will decrease Outcome: Progressing   Problem: Safety: Goal: Ability to remain free from injury will improve Outcome: Progressing   Problem: Pain Managment: Goal: General experience of comfort will improve Outcome: Progressing   Problem: Skin Integrity: Goal: Risk for impaired skin integrity will decrease Outcome: Progressing

## 2020-09-27 ENCOUNTER — Emergency Department: Payer: Medicare (Managed Care)

## 2020-09-27 ENCOUNTER — Encounter: Payer: Self-pay | Admitting: Emergency Medicine

## 2020-09-27 ENCOUNTER — Other Ambulatory Visit: Payer: Self-pay

## 2020-09-27 ENCOUNTER — Emergency Department
Admission: EM | Admit: 2020-09-27 | Discharge: 2020-09-27 | Disposition: A | Discharge status: Home / Self Care | Payer: Medicare (Managed Care) | Attending: Emergency Medicine | Admitting: Emergency Medicine

## 2020-09-27 DIAGNOSIS — I10 Essential (primary) hypertension: Secondary | ICD-10-CM | POA: Insufficient documentation

## 2020-09-27 DIAGNOSIS — E119 Type 2 diabetes mellitus without complications: Secondary | ICD-10-CM | POA: Insufficient documentation

## 2020-09-27 DIAGNOSIS — Z9889 Other specified postprocedural states: Secondary | ICD-10-CM

## 2020-09-27 DIAGNOSIS — Z7984 Long term (current) use of oral hypoglycemic drugs: Secondary | ICD-10-CM | POA: Insufficient documentation

## 2020-09-27 DIAGNOSIS — Z981 Arthrodesis status: Secondary | ICD-10-CM | POA: Insufficient documentation

## 2020-09-27 DIAGNOSIS — M545 Low back pain, unspecified: Secondary | ICD-10-CM | POA: Insufficient documentation

## 2020-09-27 DIAGNOSIS — Z79899 Other long term (current) drug therapy: Secondary | ICD-10-CM | POA: Insufficient documentation

## 2020-09-27 DIAGNOSIS — G894 Chronic pain syndrome: Secondary | ICD-10-CM

## 2020-09-27 DIAGNOSIS — Y92410 Unspecified street and highway as the place of occurrence of the external cause: Secondary | ICD-10-CM | POA: Insufficient documentation

## 2020-09-27 LAB — CBC
HCT: 39.8 % (ref 39.0–52.0)
Hemoglobin: 13.4 g/dL (ref 13.0–17.0)
MCH: 30.1 pg (ref 26.0–34.0)
MCHC: 33.7 g/dL (ref 30.0–36.0)
MCV: 89.4 fL (ref 80.0–100.0)
Platelets: 417 10*3/uL — ABNORMAL HIGH (ref 150–400)
RBC: 4.45 MIL/uL (ref 4.22–5.81)
RDW: 13.7 % (ref 11.5–15.5)
WBC: 10.4 10*3/uL (ref 4.0–10.5)
nRBC: 0 % (ref 0.0–0.2)

## 2020-09-27 LAB — BASIC METABOLIC PANEL
Anion gap: 9 (ref 5–15)
BUN: 24 mg/dL — ABNORMAL HIGH (ref 8–23)
CO2: 26 mmol/L (ref 22–32)
Calcium: 9 mg/dL (ref 8.9–10.3)
Chloride: 104 mmol/L (ref 98–111)
Creatinine, Ser: 0.84 mg/dL (ref 0.61–1.24)
GFR, Estimated: >60 mL/min (ref >60)
Glucose, Bld: 132 mg/dL — ABNORMAL HIGH (ref 70–99)
Potassium: 3.7 mmol/L (ref 3.5–5.1)
Sodium: 139 mmol/L (ref 135–145)

## 2020-09-27 LAB — GLUCOSE, CAPILLARY
Glucose-Capillary: 153 mg/dL — ABNORMAL HIGH (ref 70–99)
Glucose-Capillary: 113 mg/dL — ABNORMAL HIGH (ref 70–99)

## 2020-09-27 MED ORDER — SENNOSIDES-DOCUSATE SODIUM 8.6-50 MG PO TABS: 1.0000 | ORAL_TABLET | Freq: Two times a day (BID) | ORAL | 0 refills | Status: AC

## 2020-09-27 MED ORDER — LIDOCAINE 5 % EX PTCH: 1.0000 | MEDICATED_PATCH | CUTANEOUS | 0 refills | Status: DC

## 2020-09-27 MED ORDER — OXYCODONE-ACETAMINOPHEN 10-325 MG PO TABS: 1.0000 | ORAL_TABLET | Freq: Four times a day (QID) | ORAL | 0 refills | Status: DC | PRN

## 2020-09-27 MED ORDER — HYDROMORPHONE HCL 4 MG PO TABS: 4.0000 mg | ORAL_TABLET | Freq: Four times a day (QID) | ORAL | 0 refills | Status: DC | PRN

## 2020-09-27 MED ORDER — CYCLOBENZAPRINE HCL 5 MG PO TABS: 5.0000 mg | ORAL_TABLET | Freq: Three times a day (TID) | ORAL | 0 refills | Status: AC

## 2020-09-27 MED ORDER — SENNOSIDES-DOCUSATE SODIUM 8.6-50 MG PO TABS: 1.0000 | ORAL_TABLET | Freq: Two times a day (BID) | ORAL | Status: DC

## 2020-09-27 MED ORDER — UNABLE TO FIND
0 refills | Status: DC
Start: 2020-09-27 — End: 2022-05-23

## 2020-09-27 MED ORDER — OMEPRAZOLE MAGNESIUM 20 MG PO TBEC: 20.0000 mg | DELAYED_RELEASE_TABLET | Freq: Every day | ORAL | 0 refills | Status: AC

## 2020-09-27 MED ORDER — FENTANYL 25 MCG/HR TD PT72: 1.0000 | MEDICATED_PATCH | TRANSDERMAL | 0 refills | Status: AC

## 2020-09-27 MED ORDER — CELECOXIB 200 MG PO CAPS: 200.0000 mg | ORAL_CAPSULE | Freq: Two times a day (BID) | ORAL | 0 refills | Status: AC

## 2020-09-27 MED ORDER — HYDROMORPHONE HCL 1 MG/ML IJ SOLN
1.0000 mg | Freq: Once | INTRAMUSCULAR | Status: AC
  Administered 2020-09-27: 1 mg via INTRAMUSCULAR
  Filled 2020-09-27: qty 1

## 2020-09-27 NOTE — Progress Notes (Signed)
Progress Note   Date: 09/27/2020  Howard Murphy is POD#13 from Extension of fusion to T10, L1/2 decompression  Subjective: He continues to have back pain today but denies any leg pain or numbness.  He does not note any drainage from the incision.  He is tolerating a diet.    Vital Signs: Temp:  [97.7 F (36.5 C)-98.2 F (36.8 C)] 97.7 F (36.5 C) (04/19 0339) Pulse Rate:  [64-73] 64 (04/19 0339) Resp:  [16-19] 18 (04/19 0339) BP: (100-115)/(65-72) 100/68 (04/19 0339) SpO2:  [96 %-97 %] 97 % (04/19 0339) Weight:  [81.7 kg] 81.7 kg (04/19 0339) Temp (24hrs), Avg:98 F (36.7 C), Min:97.7 F (36.5 C), Max:98.2 F (36.8 C)  Weight: 81.7 kg   Problem List Patient Active Problem List   Diagnosis Date Noted  . Syncope 09/24/2020  . Lumbar adjacent segment disease with spondylolisthesis 09/14/2020  . Uncomplicated opioid dependence ([place]) 02/20/2020  . DDD (degenerative disc disease), lumbar 02/03/2020  . Grade 1 Retrolisthesis of L1/L2 01/05/2020  . Lumbar disc extrusion with 7 mm caudal migration (L1-2) 01/05/2020  . Lumbosacral radiculopathy at L1 (Right) 01/05/2020  . Groin pain (Right) 01/05/2020  . Neural foraminal stenosis of lumbar spine (L1-2) (Right) 01/05/2020  . DDD (degenerative disc disease), lumbosacral 01/05/2020  . Acute exacerbation of chronic low back pain 12/30/2019  . Pharmacologic therapy 11/25/2019  . Disorder of skeletal system 11/25/2019  . Problems influencing health status 11/25/2019  . Osteoarthritis involving multiple joints 02/23/2019  . Chronic musculoskeletal pain 02/23/2019  . Insomnia secondary to chronic pain 02/23/2019  . Neurogenic claudication ([place]) 04/02/2018  . Chronic bilateral low back pain with bilateral sciatica 03/05/2018  . Spondylosis without myelopathy or radiculopathy, lumbosacral region 01/14/2018  . Other specified dorsopathies, sacral and sacrococcygeal region 12/26/2017  . Acute appendicitis 12/18/2017  . Medication  monitoring encounter 03/07/2017  . 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 Spondylolisthesis at L3-L4 level 08/03/2016  . Chronic pain syndrome 07/09/2016  . Encounter for medication monitoring 02/24/2016  . Cervical spondylosis 12/17/2015  . Cervical facet arthropathy 12/17/2015  . Chronic neck pain (Bilateral) (R>L) 12/17/2015  . Cervical facet syndrome (Bilateral) (R>L) 12/17/2015  . Chronic upper extremity pain (Bilateral) (R>L) 12/17/2015  . Cervical foraminal stenosis 12/17/2015  .  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 Arthropathy of shoulder (Right) 12/17/2015  . 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 Lumbar foraminal stenosis  (Left: L4-5) (Right: L1-2) 12/17/2015  . Grade 1 Anterolisthesis (2 mm) of L4 over L5. 12/17/2015  . Lumbar facet (L4-5) synovial cyst (6 mm) (Left) 12/17/2015  . Chronic sacroiliac joint pain (Bilateral) (L>R) 12/08/2015  . Neurogenic pain 12/08/2015  . Abnormal MRI, lumbar spine (12/23/2019) 11/21/2015  . Radicular pain of shoulder (Right) 10/03/2015  . Carpal tunnel syndrome (Bilateral) 10/03/2015  . Chronic cervical radicular pain (Right) 10/03/2015  . Chronic low back pain (1ry area of Pain) (Bilateral) (L>R) w/o sciatica 09/06/2015  . Opiate use (60 MME/Day) 09/05/2015  . Long term prescription opiate use 09/05/2015  . Long term current use of opiate analgesic 09/05/2015  . Spondylosis of lumbar spine 09/05/2015  . Lumbar facet joint syndrome ([place]) 09/05/2015  . Chronic hip pain (Left) 09/05/2015  . Diabetic peripheral neuropathy ([place]) 09/05/2015  . Chronic lumbar radicular pain (S1 Dermatome) (Bilateral) (L>R) 09/05/2015  . Hypokalemia 09/05/2015  . Chronic shoulder pain (2ry area of Pain) (Bilateral) (L>R) 09/05/2015  . Diabetes (  [place]) 09/05/2015  . Chronic foot pain (3ry area of Pain) (Bilateral) (L>R) 09/05/2015  .  Chronic hand pain (4th area of Pain) (Bilateral) (R>L) 09/05/2015  . Lumbar facet hypertrophy 09/05/2015  . Encounter for therapeutic drug level monitoring 09/05/2015  . Low testosterone 09/05/2015  . Occipital pain (Right) 09/05/2015  . 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 Hypertriglyceridemia 07/26/2015  . Chronic abdominal pain (RUQ) 07/14/2015  . Type 2 diabetes mellitus ([place]) 07/14/2015  . Chest pain 07/14/2015  . Complete tear of the shoulder rotator cuff (Left) 05/10/2015  . Complete tear of left rotator cuff 05/10/2015  . SOB (shortness of breath) 03/22/2015  . Elevated hemoglobin ([place]) 07/07/2013  . History of lumbar facet Synovial cyst, surgically removed. 05/29/2013  . 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 GERD (gastroesophageal reflux disease) 05/05/2013  . Benign prostatic hyperplasia 05/05/2013  . Essential hypertension 05/08/2011  . Hyperlipidemia 05/08/2011    Medications: Scheduled Meds: . atorvastatin  40 mg Oral QHS  . celecoxib  400 mg Oral Q12H  . cyclobenzaprine  7.5 mg Oral TID  . empagliflozin  25 mg Oral Daily  . fentaNYL  1 patch Transdermal Q72H  . insulin aspart  0-9 Units Subcutaneous TID PC & HS  . lidocaine  1 patch Transdermal Q24H  . linagliptin  5 mg Oral Daily  . pantoprazole  40 mg Oral Daily  . sodium chloride flush  3 mL Intravenous Q12H  . tamsulosin  0.4 mg Oral Daily  . zolpidem  5 mg Oral QHS   Continuous Infusions: PRN Meds:.acetaminophen **OR** acetaminophen, HYDROmorphone (DILAUDID) injection, magnesium hydroxide, ondansetron **OR** ondansetron (ZOFRAN) IV, oxyCODONE-acetaminophen **AND** oxyCODONE, traZODone  Labs:  Lab Results  Component Value Date   WBC 10.4 09/27/2020   WBC 11.2 (H) 09/26/2020   HCT 39.8 09/27/2020   HCT 41.0 09/26/2020   HCT 49.4 07/15/2015   HCT 55.9 (H) 03/23/2014   HCT 45.5 05/23/2012   PLT 417 (H) 09/27/2020   PLT 437 (H) 09/26/2020   PLT 300 07/15/2015   PLT 218 03/23/2014   PLT  246 05/23/2012    Lab Results  Component Value Date   INR 1.1 09/02/2020   APTT 28 09/02/2020    Lab Results  Component Value Date   NA 139 09/27/2020   NA 136 09/26/2020   NA 142 03/07/2017   NA 138 11/29/2016   NA 135 (L) 03/23/2014   NA 139 05/23/2012   K 3.7 09/27/2020   K 3.4 (L) 09/26/2020   K 2.8 (L) 03/23/2014   K 3.2 (L) 05/23/2012   BUN 24 (H) 09/27/2020   BUN 18 09/26/2020   BUN 14 03/07/2017   BUN 12 11/29/2016   BUN 16 03/23/2014   BUN 8 05/23/2012    Lab Results  Component Value Date   MG 1.8 09/25/2020   Exam: Back: Incision well approximated with some eschar, no drainage, Lidoderm patch on Neuro: 5/5 in bilateral lower extremities Sensation intact in lower extremities    CT Lumbar Spine: Interval extension of the patient's posterior fusion superiorly to the T11 level. Expected postoperative changes in the posterior spinal soft tissues without evidence of an abscess.   Assessment/Plan: Howard Murphy is POD#13 from Extension of fusion to T10, L1/2 decompression  - No surgery needed at this time based on CT findings - Recommend PT/OT - continued pain control -Okay for discharge from a surgery standpoint when medically ready  Deetta Perla, MD
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
## 2020-09-27 NOTE — Discharge Instructions (Addendum)
Please continue with normal postoperative restrictions.  Continue with current pain regimen.  Return to the ER for any numbness, tingling, weakness, fevers, worsening symptoms or any changes in your health

## 2020-09-27 NOTE — Plan of Care (Signed)
Pt

## 2020-09-27 NOTE — TOC Initial Note (Signed)
Transition of Care [facility]) - Initial/Assessment Note    Patient Details  Name: Howard Murphy MRN: [id number] Date of Birth: 14-Jul-1955  Transition of [facility]) CM/SW Contact:    Su Hilt, RN Phone Number: 09/27/2020, 11:04 AM  Clinical Narrative:       Met with the patient at the bedside, he lives at home with his wife He has a RW, WC and cane at home, does [facility], has appointment fro Dr next week for follow up, no additional needs            Expected Discharge Plan: Home/Self Care Barriers to Discharge: Barriers Resolved   Patient Goals and CMS Choice        Expected Discharge Plan and Services Expected Discharge Plan: Home/Self Care   Discharge Planning Services: CM Consult   Living arrangements for the past 2 months: Single Family Home Expected Discharge Date: 09/27/20                                    Prior Living Arrangements/Services Living arrangements for the past 2 months: Single Family Home Lives with:: Spouse              Current home services:  ([facility], [place], [facility])    Activities of Daily Living Home Assistive Devices/Equipment: Kasandra Knudsen (specify quad or straight) ADL Screening (condition at time of admission) Patient's cognitive ability adequate to safely complete daily activities?: Yes Is the patient deaf or have difficulty hearing?: No Does the patient have difficulty seeing, even when wearing glasses/contacts?: No Does the patient have difficulty concentrating, remembering, or making decisions?: No Patient able to express need for assistance with ADLs?: Yes Does the patient have difficulty dressing or bathing?: No Independently performs ADLs?: Yes (appropriate for developmental age) Does the patient have difficulty walking or climbing stairs?: Yes Weakness of Legs: None Weakness of Arms/Hands: None  Permission Sought/Granted                  Emotional Assessment              Admission diagnosis:   Syncope [R55] Post-operative state [Z98.890] Fusion of lumbar spine [M43.26] Syncope, unspecified syncope type [R55] Patient Active Problem List   Diagnosis Date Noted  . Post-operative state   . Syncope 09/24/2020  . Lumbar adjacent segment disease with spondylolisthesis 09/14/2020  . Uncomplicated opioid dependence ([place]) 02/20/2020  . DDD (degenerative disc disease), lumbar 02/03/2020  . Grade 1 Retrolisthesis of L1/L2 01/05/2020  . Lumbar disc extrusion with 7 mm caudal migration (L1-2) 01/05/2020  . Lumbosacral radiculopathy at L1 (Right) 01/05/2020  . Groin pain (Right) 01/05/2020  . Neural foraminal stenosis of lumbar spine (L1-2) (Right) 01/05/2020  . DDD (degenerative disc disease), lumbosacral 01/05/2020  . Acute exacerbation of chronic low back pain 12/30/2019  . Pharmacologic therapy 11/25/2019  . Disorder of skeletal system 11/25/2019  . Problems influencing health status 11/25/2019  . Osteoarthritis involving multiple joints 02/23/2019  . Chronic musculoskeletal pain 02/23/2019  . Insomnia secondary to chronic pain 02/23/2019  . Neurogenic claudication ([place]) 04/02/2018  . Chronic bilateral low back pain with bilateral sciatica 03/05/2018  . Spondylosis without myelopathy or radiculopathy, lumbosacral region 01/14/2018  . Other specified dorsopathies, sacral and sacrococcygeal region 12/26/2017  . Acute appendicitis 12/18/2017  . Medication monitoring encounter 03/07/2017  . 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  Spondylolisthesis at L3-L4 level 08/03/2016  . Chronic pain syndrome 07/09/2016  . Encounter for medication monitoring 02/24/2016  . Cervical spondylosis 12/17/2015  . Cervical facet arthropathy 12/17/2015  . Chronic neck pain (Bilateral) (R>L) 12/17/2015  . Cervical facet syndrome (Bilateral) (R>L) 12/17/2015  . Chronic upper extremity pain (Bilateral) (R>L) 12/17/2015  .  Cervical foraminal stenosis 12/17/2015  .  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 Arthropathy of shoulder (Right) 12/17/2015  . 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 Lumbar foraminal stenosis  (Left: L4-5) (Right: L1-2) 12/17/2015  . Grade 1 Anterolisthesis (2 mm) of L4 over L5. 12/17/2015  . Lumbar facet (L4-5) synovial cyst (6 mm) (Left) 12/17/2015  . Chronic sacroiliac joint pain (Bilateral) (L>R) 12/08/2015  . Neurogenic pain 12/08/2015  . Abnormal MRI, lumbar spine (12/23/2019) 11/21/2015  . Radicular pain of shoulder (Right) 10/03/2015  . Carpal tunnel syndrome (Bilateral) 10/03/2015  . Chronic cervical radicular pain (Right) 10/03/2015  . Chronic low back pain (1ry area of Pain) (Bilateral) (L>R) w/o sciatica 09/06/2015  . Opiate use (60 MME/Day) 09/05/2015  . Long term prescription opiate use 09/05/2015  . Long term current use of opiate analgesic 09/05/2015  . Spondylosis of lumbar spine 09/05/2015  . Lumbar facet joint syndrome ([place]) 09/05/2015  . Chronic hip pain (Left) 09/05/2015  . Diabetic peripheral neuropathy ([place]) 09/05/2015  . Chronic lumbar radicular pain (S1 Dermatome) (Bilateral) (L>R) 09/05/2015  . Hypokalemia 09/05/2015  . Chronic shoulder pain (2ry area of Pain) (Bilateral) (L>R) 09/05/2015  . Diabetes ([place]) 09/05/2015  . Chronic foot pain (3ry area of Pain) (Bilateral) (L>R) 09/05/2015  . Chronic hand pain (4th area of Pain) (Bilateral) (R>L) 09/05/2015  . Lumbar facet hypertrophy 09/05/2015  . Encounter for therapeutic drug level monitoring 09/05/2015  . Low testosterone 09/05/2015  . Occipital pain (Right) 09/05/2015  . 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 Hypertriglyceridemia 07/26/2015  . Chronic abdominal pain (RUQ) 07/14/2015  . Type 2 diabetes mellitus ([place]) 07/14/2015  . Chest pain 07/14/2015  . Complete tear of the shoulder rotator cuff (Left) 05/10/2015  . Complete tear of left rotator cuff  05/10/2015  . SOB (shortness of breath) 03/22/2015  . Elevated hemoglobin ([place]) 07/07/2013  . History of lumbar facet Synovial cyst, surgically removed. 05/29/2013    Class: History of  . 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 GERD (gastroesophageal reflux disease) 05/05/2013  . Benign prostatic hyperplasia 05/05/2013  . Essential hypertension 05/08/2011  . Hyperlipidemia 05/08/2011   PCP:  [facility], [place]:   [facility] [address] [place] [address] Phone: [phone] Fax: [phone]  [id number] [place] [place] [address] [id number]: [phone]: [phone]    Social Determinants of Health (SDOH) Interventions    Readmission Risk Interventions No flowsheet data found.

## 2020-09-27 NOTE — ED Notes (Signed)
Patient transported to X-ray 
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
## 2020-09-27 NOTE — Discharge Summary (Signed)
Physician Discharge Summary  Howard Murphy XBM:[id number] DOB: [birth year] DOA: 09/24/2020  PCP: [facility] [place] date: 09/24/2020 Discharge date: 09/27/2020  Time spent: 35 minutes  Recommendations for Outpatient Follow-up:  1. Advised not to resume Percocet until he is done with his Dilaudid Po PRN 2. NSg Dr.Yarbrough in 10days 3. Outpatient PT   Discharge Diagnoses:  Active Problems:   Syncope   Post-operative state Severe low back pain Recent lumbar decompression Chronic low back pain Chronic narcotic dependence Type 2 diabetes mellitus  Discharge Condition: Improving  Diet recommendation: Diabetic  Filed Weights   09/24/20 1912 09/27/20 0339  Weight: 81.6 kg 81.7 kg    History of present illness:  65 year old male with history of chronic low back pain, history of multiple back surgeries, type 2 diabetes mellitus, nephrolithiasis, hypertension, dyslipidemia underwent lumbar spinal decompression by Dr. Cari Caraway on 4/6, subsequently discharged home on 4/9 on oral Dilaudid in addition to his chronic Percocets. -Patient reports severe 10/10 pain since then, barely able to move, yesterday he tried to walk a few steps to the bathroom, subsequently was found down by his fiance and brought to the ED 4/16.  -The ED was noted to be hypotensive with SBP of 82 have mild hypokalemia, mild leukocytosis, WBC of 11.8   Hospital Course:   Syncope -Most likely secondary to hypotension, pain etc, no events on telemetry -Blood pressure improved after fluid bolus -He is on high-dose chlorthalidone and lisinopril at baseline which have been discontinued -2D echo was unremarkable -Physical therapy eval completed, ambulating independently now without symptoms  Severe low back pain, Fall/syncope Recent lumbar decompression 4/6 Chronic back pain with multiple surgeries -Neurosurgery Dr. Cari Caraway following, he was unable to have an MRI , CT notable for expected  postop findings -Clinically improving on low-dose fentanyl patch, Flexeril, Celebrex, lidocaine patch and IV Dilaudid -He also takes chronic Percocet 10/325 at baseline for his low back pain -Discharged home on the above regimen, last week he was discharged by his surgeon on Dilaudid 4 mg every 3 hours as needed, this was changed to 4-8mg  every 6 hours as needed for 1 week, low-dose fentanyl was prescribed for 4 doses i.e. 12 days only and patient was educated that these escalations are short-term only in the setting of his postop pain -He will eventually just need to be on his basal Percocet -Ambulating much better now, PT recommended outpatient therapy -Follow-up with neurosurgeon Dr. Cari Caraway  Type 2 diabetes mellitus -CBGs are stable, continue Januvia and Jardiance  Discharge Exam: Vitals:   09/27/20 0339 09/27/20 0832  BP: 100/68 108/73  Pulse: 64 74  Resp: 18 19  Temp: 97.7 F (36.5 C) 97.6 F (36.4 C)  SpO2: 97% 96%    General: AAOx3 Cardiovascular: S1-S2, regular rate rhythm Respiratory: Clear  Discharge Instructions    Allergies as of 09/27/2020      Reactions   Adhesive [tape] Other (See Comments)   Honeycomb dressing, blisters      Medication List    STOP taking these medications   chlorthalidone 50 MG tablet Commonly known as: HYGROTON   lisinopril 20 MG tablet Commonly known as: ZESTRIL   methocarbamol 500 MG tablet Commonly known as: ROBAXIN     TAKE these medications   acetaminophen 500 MG tablet Commonly known as: TYLENOL Take 2 tablets (1,000 mg total) by mouth every 6 (six) hours.   atorvastatin 40 MG tablet Commonly known as: LIPITOR Take 1 tablet (40 mg total) by mouth at  bedtime.   celecoxib 200 MG capsule Commonly known as: CELEBREX Take 1 capsule (200 mg total) by mouth 2 (two) times daily for 10 days. What changed: when to take this   cyclobenzaprine 5 MG tablet Commonly known as: FLEXERIL Take 1 tablet (5 mg total) by mouth  3 (three) times daily for 15 days.   fentaNYL 25 MCG/HR Commonly known as: [place] 1 patch onto the skin every 3 (three) days for 12 days. Start taking on: September 29, 2020   HYDROmorphone 4 MG tablet Commonly known as: DILAUDID Take 1-2 tablets (4-8 mg total) by mouth every 6 (six) hours as needed for severe pain. What changed:   how much to take  when to take this   Januvia 100 MG tablet Generic drug: sitaGLIPtin Take 100 mg by mouth daily.   Jardiance 25 MG Tabs tablet Generic drug: empagliflozin Take 25 mg by mouth daily.   lidocaine 5 % Commonly known as: LIDODERM Place 1 patch onto the skin daily. Remove & Discard patch within 12 hours or as directed by MD Start taking on: September 28, 2020   meclizine 25 MG tablet Commonly known as: ANTIVERT Take 25 mg by mouth every 6 (six) hours as needed for dizziness.   omeprazole 20 MG tablet Commonly known as: PriLOSEC OTC Take 1 tablet (20 mg total) by mouth daily for 20 days.   oxyCODONE-acetaminophen 10-325 MG tablet Commonly known as: Percocet Take 1 tablet by mouth every 6 (six) hours as needed for pain (Restart this only after you are out of Dilaudid). Restart this only after you are out of Dilaudid Start taking on: Oct 12, 2020 What changed:   reasons to take this  additional instructions  These instructions start on Oct 12, 2020. If you are unsure what to do until then, ask your doctor or other care provider.  Another medication with the same name was removed. Continue taking this medication, and follow the directions you see here.   senna-docusate 8.6-50 MG tablet Commonly known as: Senokot-S Take 1 tablet by mouth 2 (two) times daily.   tamsulosin 0.4 MG Caps capsule Commonly known as: FLOMAX Take 1 capsule (0.4 mg total) by mouth daily.   testosterone cypionate 200 MG/ML injection Commonly known as: DEPOTESTOSTERONE CYPIONATE Inject 200 mg into the muscle every 7 (seven) days.   zolpidem 10 MG  tablet Commonly known as: AMBIEN Take 10 mg by mouth at bedtime.      Allergies  Allergen Reactions  . Adhesive [Tape] Other (See Comments)    Honeycomb dressing, blisters    Follow-up [place], [facility]. Schedule an appointment as soon as possible for a visit in 1 week(s).   Specialty: General Practice Contact information: [place] [facility] [address] [phone]        Meade Maw, MD. Go in 1 week(s).   Specialty: Neurosurgery Contact information: [place] [address] [phone]                The results of significant diagnostics from this hospitalization (including imaging, microbiology, ancillary and laboratory) are listed below for reference.    Significant Diagnostic Studies: DG Thoracic Spine 2 View  Result Date: 09/25/2020 CLINICAL DATA:  Syncope. EXAM: THORACIC SPINE 2 VIEWS COMPARISON:  None. FINDINGS: There is no evidence of thoracic spine fracture. Alignment is normal. Degenerative joint changes of the lower thoracic spine are identified. Patient status post prior rod placement of the lower  thoracic spine. IMPRESSION: No acute fracture or dislocation. Electronically Signed   By: Abelardo Diesel M.D.   On: 09/25/2020 12:42   DG Lumbar Spine 2-3 Views  Result Date: 09/25/2020 CLINICAL DATA:  Syncope. EXAM: LUMBAR SPINE - 2-3 VIEW COMPARISON:  September 15, 2020 FINDINGS: There is no evidence of lumbar spine fracture. Patient is status post prior fixation of thoracolumbar spine with multiple spinal rods and intervertebral cages unchanged compared prior exam. IMPRESSION: No acute fracture or dislocation. Electronically Signed   By: Abelardo Diesel M.D.   On: 09/25/2020 12:43   DG Lumbar Spine 2-3 Views  Result Date: 09/15/2020 CLINICAL DATA:  Lumbar fusion EXAM: LUMBAR SPINE - 2-3 VIEW COMPARISON:  03/24/2020 FINDINGS: Bilateral pedicle screw fusion extending from T10 through the iliac bone  bilaterally. Previously there is fusion L2 through the iliac bone which is unchanged. New fusion extends from T10 through L2. Hardware in satisfactory position. Interbody fusion L2-3, L3-4, L4-5. Extensive disc degeneration at L1-2.  Negative for fracture. IMPRESSION: Extension of previous fusion which now extends from T10 through the iliac bone. Hardware in satisfactory position. Electronically Signed   By: Franchot Gallo M.D.   On: 09/15/2020 15:05   DG Lumbar Spine 2-3 Views  Result Date: 09/14/2020 CLINICAL DATA:  Spinal surgery. EXAM: LUMBAR SPINE - 2-3 VIEW; DG C-ARM 1-60 MIN COMPARISON:  03/24/2020 FINDINGS: Intraoperative fluoroscopic images of the thoracolumbar spine were obtained. Fluoroscopy time is 4.5 seconds with a dose of 1.9 mGy. Previous surgical fusion at L2 through S1. New bilateral pedicle screws and rods at T10, T11, T12 and L1. IMPRESSION: New surgical fusion at T10 through L1. Electronically Signed   By: Markus Daft M.D.   On: 09/14/2020 15:51   CT THORACIC SPINE W CONTRAST  Result Date: 09/26/2020 CLINICAL DATA:  Syncope, swelling in the back, and pain with leukocytosis. Recent thoracolumbar fusion. EXAM: CT LUMBAR SPINE WITH CONTRAST; CT THORACIC SPINE WITH CONTRAST TECHNIQUE: Multidetector CT images of thoracic and lumbar spine was performed according to the standard protocol following intravenous contrast administration. CONTRAST:  147mL OMNIPAQUE IOHEXOL 300 MG/ML  SOLN COMPARISON:  Thoracic spine MRI 04/19/2020. Lumbar spine CT 04/19/2020. Lumbar spine MRI 12/23/2019. FINDINGS: CT THORACIC SPINE FINDINGS Alignment: Trace retrolisthesis of T11 on T12. Vertebrae: No acute fracture or suspicious osseous lesion. Unchanged mild chronic anterior wedging of the T11 vertebral body. Interval extension of the patient's posterior lumbar fusion superiorly to the T11 level. Bilateral pedicle screws at T11 and T12 appear well-positioned without evidence of loosening. Paraspinal and other soft  tissues: Postoperative changes in the posterior lower thoracic soft tissues with bone graft material bilaterally and minimal gas. No unexpected fluid collection. Disc levels: Prominent anterior vertebral osteophytes from T7 to L1. No evidence of significant spinal stenosis on this non myelographic examination. Asymmetrically severe facet arthrosis on the left from T5-6 to T8-9 resulting in moderate left neural foraminal stenosis at T5-6 and mild stenosis at the other levels. Mild right neural foraminal stenosis at T6-7 due to facet arthrosis. CT LUMBAR SPINE FINDINGS Segmentation: There is transitional lumbosacral anatomy. Based on the concurrent thoracic spine CT, the transitional segment is a partially lumbarized S1. Note that this numbering differs from that used on the prior lumbar spine CT and lumbar spine MRI from 2021 which did not include concurrent thoracic spine imaging. Alignment: Unchanged minimal retrolisthesis of L2 on L3. Vertebrae: Interval superior extension of the patient's prior posterior lumbar fusion. New pedicle screws are in place bilaterally at L1 and  L2 and appear well-positioned without evidence of loosening. Pedicle screws remain in place bilaterally from L3-S2 except at the L5 level where the right-sided screw had been previously removed. Bilateral iliac screws remain in place traversing the SI joints. Interbody implants remain in place at L3-4, L4-5, and L5-S1 with evidence of solid interbody fusion at each level. No acute fracture or suspicious osseous lesion. Paraspinal and other soft tissues: Postoperative changes throughout the posterior lumbar soft tissues with new bone graft material in place in the upper lumbar spine. No unexpected fluid collection. Abdominal aortic atherosclerosis. Disc levels: L1-2: Interval posterior fusion. Unchanged spondylosis without evidence of significant stenosis. L2-3: Unchanged advanced disc degeneration asymmetrically worse on the right with severe  disc space narrowing, prominent degenerative sclerosis, and vacuum disc. Interval right laminectomy. Unchanged mild bilateral neural foraminal stenosis due to spurring. L3-4 through S1-2: Previous fusion with similar appearance to the prior CT. No evidence of significant stenosis. IMPRESSION: 1. Transitional lumbosacral anatomy. Based on the current thoracic spine CT, the transitional segment is referred to as a partially lumbarized S1 which differs from the numbering used on the prior lumbar spine CT and MRI. 2. Interval extension of the patient's posterior fusion superiorly to the T11 level. Expected postoperative changes in the posterior spinal soft tissues without evidence of an abscess. 3. Aortic Atherosclerosis (ICD10-I70.0). Electronically Signed   By: Logan Bores M.D.   On: 09/26/2020 11:01   CT LUMBAR SPINE W CONTRAST  Result Date: 09/26/2020 CLINICAL DATA:  Syncope, swelling in the back, and pain with leukocytosis. Recent thoracolumbar fusion. EXAM: CT LUMBAR SPINE WITH CONTRAST; CT THORACIC SPINE WITH CONTRAST TECHNIQUE: Multidetector CT images of thoracic and lumbar spine was performed according to the standard protocol following intravenous contrast administration. CONTRAST:  173mL OMNIPAQUE IOHEXOL 300 MG/ML  SOLN COMPARISON:  Thoracic spine MRI 04/19/2020. Lumbar spine CT 04/19/2020. Lumbar spine MRI 12/23/2019. FINDINGS: CT THORACIC SPINE FINDINGS Alignment: Trace retrolisthesis of T11 on T12. Vertebrae: No acute fracture or suspicious osseous lesion. Unchanged mild chronic anterior wedging of the T11 vertebral body. Interval extension of the patient's posterior lumbar fusion superiorly to the T11 level. Bilateral pedicle screws at T11 and T12 appear well-positioned without evidence of loosening. Paraspinal and other soft tissues: Postoperative changes in the posterior lower thoracic soft tissues with bone graft material bilaterally and minimal gas. No unexpected fluid collection. Disc levels:  Prominent anterior vertebral osteophytes from T7 to L1. No evidence of significant spinal stenosis on this non myelographic examination. Asymmetrically severe facet arthrosis on the left from T5-6 to T8-9 resulting in moderate left neural foraminal stenosis at T5-6 and mild stenosis at the other levels. Mild right neural foraminal stenosis at T6-7 due to facet arthrosis. CT LUMBAR SPINE FINDINGS Segmentation: There is transitional lumbosacral anatomy. Based on the concurrent thoracic spine CT, the transitional segment is a partially lumbarized S1. Note that this numbering differs from that used on the prior lumbar spine CT and lumbar spine MRI from 2021 which did not include concurrent thoracic spine imaging. Alignment: Unchanged minimal retrolisthesis of L2 on L3. Vertebrae: Interval superior extension of the patient's prior posterior lumbar fusion. New pedicle screws are in place bilaterally at L1 and L2 and appear well-positioned without evidence of loosening. Pedicle screws remain in place bilaterally from L3-S2 except at the L5 level where the right-sided screw had been previously removed. Bilateral iliac screws remain in place traversing the SI joints. Interbody implants remain in place at L3-4, L4-5, and L5-S1 with evidence of solid  interbody fusion at each level. No acute fracture or suspicious osseous lesion. Paraspinal and other soft tissues: Postoperative changes throughout the posterior lumbar soft tissues with new bone graft material in place in the upper lumbar spine. No unexpected fluid collection. Abdominal aortic atherosclerosis. Disc levels: L1-2: Interval posterior fusion. Unchanged spondylosis without evidence of significant stenosis. L2-3: Unchanged advanced disc degeneration asymmetrically worse on the right with severe disc space narrowing, prominent degenerative sclerosis, and vacuum disc. Interval right laminectomy. Unchanged mild bilateral neural foraminal stenosis due to spurring. L3-4  through S1-2: Previous fusion with similar appearance to the prior CT. No evidence of significant stenosis. IMPRESSION: 1. Transitional lumbosacral anatomy. Based on the current thoracic spine CT, the transitional segment is referred to as a partially lumbarized S1 which differs from the numbering used on the prior lumbar spine CT and MRI. 2. Interval extension of the patient's posterior fusion superiorly to the T11 level. Expected postoperative changes in the posterior spinal soft tissues without evidence of an abscess. 3. Aortic Atherosclerosis (ICD10-I70.0). Electronically Signed   By: Logan Bores M.D.   On: 09/26/2020 11:01   DG C-Arm 1-60 Min  Result Date: 09/14/2020 CLINICAL DATA:  Spinal surgery. EXAM: LUMBAR SPINE - 2-3 VIEW; DG C-ARM 1-60 MIN COMPARISON:  03/24/2020 FINDINGS: Intraoperative fluoroscopic images of the thoracolumbar spine were obtained. Fluoroscopy time is 4.5 seconds with a dose of 1.9 mGy. Previous surgical fusion at L2 through S1. New bilateral pedicle screws and rods at T10, T11, T12 and L1. IMPRESSION: New surgical fusion at T10 through L1. Electronically Signed   By: Markus Daft M.D.   On: 09/14/2020 15:51   DG C-Arm 1-60 Min-No Report  Result Date: 09/14/2020 Fluoroscopy was utilized by the requesting physician.  No radiographic interpretation.   ECHOCARDIOGRAM COMPLETE  Result Date: 09/25/2020    ECHOCARDIOGRAM REPORT   Patient Name:   Howard Murphy Date of Exam: 09/25/2020 Medical Rec #:  [id number]       Height:       67.0 in Accession #:    [id number]      Weight:       180.0 lb Date of Birth:  Jun 16, 1955        BSA:          1.934 m Patient Age:    8 years        BP:           109/73 mmHg Patient Gender: M               HR:           75 bpm. Exam Location:  [facility] Procedure: 2D Echo and Strain Analysis Indications:     Syncope R55  History:         Patient has no prior history of Echocardiogram examinations.  Sonographer:     [place] Referring Phys:  [id number]  [place] Diagnosing Phys: Dorris Carnes MD IMPRESSIONS  1. Left ventricular ejection fraction, by estimation, is 60 to 65%. The left ventricle has normal function. The left ventricle has no regional wall motion abnormalities. Left ventricular diastolic parameters were normal.  2. Right ventricular systolic function is normal. The right ventricular size is normal.  3. The mitral valve is normal in structure. Trivial mitral valve regurgitation.  4. The aortic valve is normal in structure. Aortic valve regurgitation is not visualized. FINDINGS  Left Ventricle: Left ventricular ejection fraction, by estimation, is 60 to 65%. The left ventricle has  normal function. The left ventricle has no regional wall motion abnormalities. The left ventricular internal cavity size was normal in size. There is  no left ventricular hypertrophy. Left ventricular diastolic parameters were normal. Right Ventricle: The right ventricular size is normal. Right vetricular wall thickness was not assessed. Right ventricular systolic function is normal. Left Atrium: Left atrial size was normal in size. Right Atrium: Right atrial size was normal in size. Pericardium: There is no evidence of pericardial effusion. Mitral Valve: The mitral valve is normal in structure. Trivial mitral valve regurgitation. Tricuspid Valve: The tricuspid valve is normal in structure. Tricuspid valve regurgitation is trivial. Aortic Valve: The aortic valve is normal in structure. Aortic valve regurgitation is not visualized. Aortic valve peak gradient measures 13.8 mmHg. Pulmonic Valve: The pulmonic valve was normal in structure. Pulmonic valve regurgitation is not visualized. Aorta: The aortic root and ascending aorta are structurally normal, with no evidence of dilitation. IAS/Shunts: The interatrial septum was not assessed.  LEFT VENTRICLE PLAX 2D LVIDd:         4.45 cm  Diastology LVIDs:         3.04 cm  LV e' medial:    9.14 cm/s LV PW:         0.97 cm  LV E/e'  medial:  11.5 LV IVS:        1.26 cm  LV e' lateral:   11.90 cm/s LVOT diam:     1.80 cm  LV E/e' lateral: 8.8 LV SV:         62 LV SV Index:   32       2D Longitudinal Strain LVOT Area:     2.54 cm 2D Strain GLS Avg:     18.8 %  RIGHT VENTRICLE RV Basal diam:  2.80 cm RV S prime:     16.20 cm/s TAPSE (M-mode): 1.7 cm LEFT ATRIUM             Index LA diam:        3.80 cm 1.97 cm/m LA Vol (A2C):   56.2 ml 29.06 ml/m LA Vol (A4C):   38.6 ml 19.96 ml/m LA Biplane Vol: 50.2 ml 25.96 ml/m  AORTIC VALVE                PULMONIC VALVE AV Area (Vmax): 1.76 cm    PV Vmax:       1.21 m/s AV Vmax:        186.00 cm/s PV Peak grad:  5.9 mmHg AV Peak Grad:   13.8 mmHg LVOT Vmax:      129.00 cm/s LVOT Vmean:     80.500 cm/s LVOT VTI:       0.242 m  AORTA Ao Root diam: 2.80 cm Ao Asc diam:  3.00 cm MITRAL VALVE                TRICUSPID VALVE MV Area (PHT): 4.86 cm     TV Peak grad:   21.7 mmHg MV Decel Time: 156 msec     TV Vmax:        2.33 m/s MV E velocity: 105.00 cm/s MV A velocity: 84.00 cm/s   SHUNTS MV E/A ratio:  1.25         Systemic VTI:  0.24 m                             Systemic Diam: 1.80 cm Dorris Carnes MD Electronically signed  by Dorris Carnes MD Signature Date/Time: 09/25/2020/12:14:45 PM    Final     Microbiology: Recent Results (from the past 240 hour(s))  Resp Panel by RT-PCR (Flu A&B, Covid) Nasopharyngeal Swab     Status: None   Collection Time: 09/25/20 12:38 AM   Specimen: Nasopharyngeal Swab; Nasopharyngeal(NP) swabs in vial transport medium  Result Value Ref Range Status   SARS Coronavirus 2 by RT PCR NEGATIVE NEGATIVE Final    Comment: (NOTE) SARS-CoV-2 target nucleic acids are NOT DETECTED.  The SARS-CoV-2 RNA is generally detectable in upper respiratory specimens during the acute phase of infection. The lowest concentration of SARS-CoV-2 viral copies this assay can detect is 138 copies/mL. A negative result does not preclude SARS-Cov-2 infection and should not be used as the sole basis  for treatment or other patient management decisions. A negative result may occur with  improper specimen collection/handling, submission of specimen other than nasopharyngeal swab, presence of viral mutation(s) within the areas targeted by this assay, and inadequate number of viral copies(<138 copies/mL). A negative result must be combined with clinical observations, patient history, and epidemiological information. The expected result is Negative.  Fact Sheet for Patients:  EntrepreneurPulse.com.au  Fact Sheet for Healthcare Providers:  IncredibleEmployment.be  This test is no t yet approved or cleared by the [place] FDA and  has been authorized for detection and/or diagnosis of SARS-CoV-2 by FDA under an Emergency Use Authorization (EUA). This EUA will remain  in effect (meaning this test can be used) for the duration of the COVID-19 declaration under Section 564(b)(1) of the Act, 21 U.S.C.section 360bbb-3(b)(1), unless the authorization is terminated  or revoked sooner.       Influenza A by PCR NEGATIVE NEGATIVE Final   Influenza B by PCR NEGATIVE NEGATIVE Final    Comment: (NOTE) The Xpert Xpress SARS-CoV-2/FLU/RSV plus assay is intended as an aid in the diagnosis of influenza from Nasopharyngeal swab specimens and should not be used as a sole basis for treatment. Nasal washings and aspirates are unacceptable for Xpert Xpress SARS-CoV-2/FLU/RSV testing.  Fact Sheet for Patients: EntrepreneurPulse.com.au  Fact Sheet for Healthcare Providers: IncredibleEmployment.be  This test is not yet approved or cleared by the [place] FDA and has been authorized for detection and/or diagnosis of SARS-CoV-2 by FDA under an Emergency Use Authorization (EUA). This EUA will remain in effect (meaning this test can be used) for the duration of the COVID-19 declaration under Section 564(b)(1) of the Act, 21  U.S.C. section 360bbb-3(b)(1), unless the authorization is terminated or revoked.  Performed at [facility], [address] [place], [place] [address]      Labs: Basic Metabolic Panel: Recent Labs  Lab 09/24/20 1922 09/25/20 0500 09/25/20 0506 09/26/20 0401 09/27/20 0545  NA 138 140  --  136 139  K 3.3* 4.0  --  3.4* 3.7  CL 108 110  --  104 104  CO2 23 23  --  24 26  GLUCOSE 136* 151*  --  102* 132*  BUN 17 14  --  18 24*  CREATININE 1.40* 1.03  --  0.87 0.84  CALCIUM 8.4* 8.4*  --  8.8* 9.0  MG  --   --  1.8  --   --    Liver Function Tests: No results for input(s): AST, ALT, ALKPHOS, BILITOT, PROT, ALBUMIN in the last 168 hours. No results for input(s): LIPASE, AMYLASE in the last 168 hours. No results for input(s): AMMONIA in the last 168 hours. CBC:  Recent Labs  Lab 09/24/20 1922 09/25/20 0500 09/25/20 1227 09/26/20 0401 09/27/20 0545  WBC 11.9* 11.8* 12.7* 11.2* 10.4  NEUTROABS 8.3*  --   --   --   --   HGB 12.0* 13.2 13.6 13.6 13.4  HCT 37.4* 39.3 40.5 41.0 39.8  MCV 92.3 90.8 89.2 89.3 89.4  PLT 383 400 456* 437* 417*   Cardiac Enzymes: No results for input(s): CKTOTAL, CKMB, CKMBINDEX, TROPONINI in the last 168 hours. BNP: BNP (last 3 results) No results for input(s): BNP in the last 8760 hours.  ProBNP (last 3 results) No results for input(s): PROBNP in the last 8760 hours.  CBG: Recent Labs  Lab 09/26/20 0831 09/26/20 1215 09/26/20 1700 09/26/20 2116 09/27/20 0833  GLUCAP 99 93 130* 127* 153*       Signed:  Domenic Polite MD.  [facility] 09/27/2020, 10:52 AM

## 2020-10-21 ENCOUNTER — Encounter: Payer: Self-pay | Admitting: Emergency Medicine

## 2020-10-21 ENCOUNTER — Other Ambulatory Visit: Payer: Self-pay

## 2020-10-21 ENCOUNTER — Inpatient Hospital Stay
Admission: EM | Admit: 2020-10-21 | Discharge: 2020-10-27 | DRG: 857 | Disposition: A | Discharge status: Home / Self Care | Payer: Medicare (Managed Care) | Attending: Internal Medicine | Admitting: Internal Medicine

## 2020-10-21 DIAGNOSIS — Z87891 Personal history of nicotine dependence: Secondary | ICD-10-CM

## 2020-10-21 DIAGNOSIS — Z7984 Long term (current) use of oral hypoglycemic drugs: Secondary | ICD-10-CM

## 2020-10-21 DIAGNOSIS — Y838 Other surgical procedures as the cause of abnormal reaction of the patient, or of later complication, without mention of misadventure at the time of the procedure: Secondary | ICD-10-CM | POA: Diagnosis present

## 2020-10-21 DIAGNOSIS — E785 Hyperlipidemia, unspecified: Secondary | ICD-10-CM | POA: Diagnosis present

## 2020-10-21 DIAGNOSIS — N400 Enlarged prostate without lower urinary tract symptoms: Secondary | ICD-10-CM | POA: Diagnosis present

## 2020-10-21 DIAGNOSIS — E876 Hypokalemia: Secondary | ICD-10-CM | POA: Diagnosis present

## 2020-10-21 DIAGNOSIS — Z87442 Personal history of urinary calculi: Secondary | ICD-10-CM

## 2020-10-21 DIAGNOSIS — Z9049 Acquired absence of other specified parts of digestive tract: Secondary | ICD-10-CM

## 2020-10-21 DIAGNOSIS — Z8249 Family history of ischemic heart disease and other diseases of the circulatory system: Secondary | ICD-10-CM

## 2020-10-21 DIAGNOSIS — M5489 Other dorsalgia: Secondary | ICD-10-CM | POA: Diagnosis not present

## 2020-10-21 DIAGNOSIS — M545 Low back pain, unspecified: Secondary | ICD-10-CM | POA: Diagnosis present

## 2020-10-21 DIAGNOSIS — Z981 Arthrodesis status: Secondary | ICD-10-CM

## 2020-10-21 DIAGNOSIS — T8141XA Infection following a procedure, superficial incisional surgical site, initial encounter: Principal | ICD-10-CM | POA: Diagnosis present

## 2020-10-21 DIAGNOSIS — K219 Gastro-esophageal reflux disease without esophagitis: Secondary | ICD-10-CM | POA: Diagnosis present

## 2020-10-21 DIAGNOSIS — G43909 Migraine, unspecified, not intractable, without status migrainosus: Secondary | ICD-10-CM | POA: Diagnosis present

## 2020-10-21 DIAGNOSIS — T8149XA Infection following a procedure, other surgical site, initial encounter: Secondary | ICD-10-CM | POA: Diagnosis present

## 2020-10-21 DIAGNOSIS — Z9109 Other allergy status, other than to drugs and biological substances: Secondary | ICD-10-CM

## 2020-10-21 DIAGNOSIS — E119 Type 2 diabetes mellitus without complications: Secondary | ICD-10-CM | POA: Diagnosis present

## 2020-10-21 DIAGNOSIS — Z20822 Contact with and (suspected) exposure to covid-19: Secondary | ICD-10-CM | POA: Diagnosis present

## 2020-10-21 DIAGNOSIS — L03312 Cellulitis of back [any part except buttock]: Secondary | ICD-10-CM | POA: Diagnosis present

## 2020-10-21 DIAGNOSIS — I10 Essential (primary) hypertension: Secondary | ICD-10-CM | POA: Diagnosis present

## 2020-10-21 DIAGNOSIS — T8142XA Infection following a procedure, deep incisional surgical site, initial encounter: Secondary | ICD-10-CM

## 2020-10-21 DIAGNOSIS — Z79899 Other long term (current) drug therapy: Secondary | ICD-10-CM

## 2020-10-21 LAB — BASIC METABOLIC PANEL
Anion gap: 9 (ref 5–15)
BUN: 16 mg/dL (ref 8–23)
CO2: 28 mmol/L (ref 22–32)
Calcium: 8.9 mg/dL (ref 8.9–10.3)
Chloride: 99 mmol/L (ref 98–111)
Creatinine, Ser: 0.98 mg/dL (ref 0.61–1.24)
GFR, Estimated: >60 mL/min (ref >60)
Glucose, Bld: 176 mg/dL — ABNORMAL HIGH (ref 70–99)
Potassium: 3.6 mmol/L (ref 3.5–5.1)
Sodium: 136 mmol/L (ref 135–145)

## 2020-10-21 LAB — CBC WITH DIFFERENTIAL/PLATELET
Abs Immature Granulocytes: 0.03 10*3/uL (ref 0.00–0.07)
Basophils Absolute: 0.1 10*3/uL (ref 0.0–0.1)
Basophils Relative: 1 %
Eosinophils Absolute: 0.3 10*3/uL (ref 0.0–0.5)
Eosinophils Relative: 3 %
HCT: 43.1 % (ref 39.0–52.0)
Hemoglobin: 13.9 g/dL (ref 13.0–17.0)
Immature Granulocytes: 0 %
Lymphocytes Relative: 36 %
Lymphs Abs: 3 10*3/uL (ref 0.7–4.0)
MCH: 28.5 pg (ref 26.0–34.0)
MCHC: 32.3 g/dL (ref 30.0–36.0)
MCV: 88.3 fL (ref 80.0–100.0)
Monocytes Absolute: 0.7 10*3/uL (ref 0.1–1.0)
Monocytes Relative: 8 %
Neutro Abs: 4.5 10*3/uL (ref 1.7–7.7)
Neutrophils Relative %: 52 %
Platelets: 215 10*3/uL (ref 150–400)
RBC: 4.88 MIL/uL (ref 4.22–5.81)
RDW: 13.5 % (ref 11.5–15.5)
WBC: 8.6 10*3/uL (ref 4.0–10.5)
nRBC: 0 % (ref 0.0–0.2)

## 2020-10-21 MED ORDER — MORPHINE SULFATE (PF) 4 MG/ML IV SOLN
4.0000 mg | Freq: Once | INTRAVENOUS | Status: DC
  Filled 2020-10-21: qty 1

## 2020-10-21 MED ORDER — ONDANSETRON HCL 4 MG/2ML IJ SOLN
4.0000 mg | Freq: Once | INTRAMUSCULAR | Status: AC
  Administered 2020-10-22: 4 mg via INTRAVENOUS
  Filled 2020-10-21: qty 2

## 2020-10-21 NOTE — Progress Notes (Signed)
Pt called into answering service earlier tonight with concern for wound drainage. Based on description, the patient probably has a wound infection and will require a washout.   Plan: - labs including CRP and ESR, UA - CT t/l spine without.  - MRI t/l spine with and without.  - admit medicine - NPO after midnight for possible washout tomorrow

## 2020-10-21 NOTE — ED Triage Notes (Signed)
Pt had titanium placed in his back on April 6th, He states that his girlfriend noticed redness and puss coming out of his back last night. He states that he is having pain.

## 2020-10-22 ENCOUNTER — Emergency Department: Payer: Medicare (Managed Care)

## 2020-10-22 ENCOUNTER — Encounter: Payer: Self-pay | Admitting: Internal Medicine

## 2020-10-22 ENCOUNTER — Inpatient Hospital Stay: Payer: Medicare (Managed Care) | Admitting: Certified Registered"

## 2020-10-22 ENCOUNTER — Inpatient Hospital Stay: Payer: Medicare (Managed Care)

## 2020-10-22 ENCOUNTER — Encounter
Admission: EM | Disposition: A | Discharge status: Home / Self Care | Payer: Self-pay | Source: Home / Self Care | Attending: Hospitalist

## 2020-10-22 DIAGNOSIS — Z7984 Long term (current) use of oral hypoglycemic drugs: Secondary | ICD-10-CM | POA: Diagnosis not present

## 2020-10-22 DIAGNOSIS — I10 Essential (primary) hypertension: Secondary | ICD-10-CM | POA: Diagnosis present

## 2020-10-22 DIAGNOSIS — L03312 Cellulitis of back [any part except buttock]: Secondary | ICD-10-CM | POA: Diagnosis present

## 2020-10-22 DIAGNOSIS — Z9109 Other allergy status, other than to drugs and biological substances: Secondary | ICD-10-CM | POA: Diagnosis not present

## 2020-10-22 DIAGNOSIS — E785 Hyperlipidemia, unspecified: Secondary | ICD-10-CM | POA: Diagnosis present

## 2020-10-22 DIAGNOSIS — E782 Mixed hyperlipidemia: Secondary | ICD-10-CM | POA: Diagnosis not present

## 2020-10-22 DIAGNOSIS — T8142XA Infection following a procedure, deep incisional surgical site, initial encounter: Secondary | ICD-10-CM | POA: Diagnosis not present

## 2020-10-22 DIAGNOSIS — K219 Gastro-esophageal reflux disease without esophagitis: Secondary | ICD-10-CM | POA: Diagnosis present

## 2020-10-22 DIAGNOSIS — M5489 Other dorsalgia: Secondary | ICD-10-CM | POA: Diagnosis present

## 2020-10-22 DIAGNOSIS — E876 Hypokalemia: Secondary | ICD-10-CM | POA: Diagnosis present

## 2020-10-22 DIAGNOSIS — E119 Type 2 diabetes mellitus without complications: Secondary | ICD-10-CM | POA: Diagnosis present

## 2020-10-22 DIAGNOSIS — Z79899 Other long term (current) drug therapy: Secondary | ICD-10-CM | POA: Diagnosis not present

## 2020-10-22 DIAGNOSIS — Y838 Other surgical procedures as the cause of abnormal reaction of the patient, or of later complication, without mention of misadventure at the time of the procedure: Secondary | ICD-10-CM | POA: Diagnosis present

## 2020-10-22 DIAGNOSIS — N400 Enlarged prostate without lower urinary tract symptoms: Secondary | ICD-10-CM

## 2020-10-22 DIAGNOSIS — Z8249 Family history of ischemic heart disease and other diseases of the circulatory system: Secondary | ICD-10-CM | POA: Diagnosis not present

## 2020-10-22 DIAGNOSIS — T8149XA Infection following a procedure, other surgical site, initial encounter: Secondary | ICD-10-CM | POA: Diagnosis not present

## 2020-10-22 DIAGNOSIS — G43909 Migraine, unspecified, not intractable, without status migrainosus: Secondary | ICD-10-CM | POA: Diagnosis present

## 2020-10-22 DIAGNOSIS — Z20822 Contact with and (suspected) exposure to covid-19: Secondary | ICD-10-CM | POA: Diagnosis present

## 2020-10-22 DIAGNOSIS — Z87442 Personal history of urinary calculi: Secondary | ICD-10-CM | POA: Diagnosis not present

## 2020-10-22 DIAGNOSIS — Z981 Arthrodesis status: Secondary | ICD-10-CM | POA: Diagnosis not present

## 2020-10-22 DIAGNOSIS — M545 Low back pain, unspecified: Secondary | ICD-10-CM

## 2020-10-22 DIAGNOSIS — Z9049 Acquired absence of other specified parts of digestive tract: Secondary | ICD-10-CM | POA: Diagnosis not present

## 2020-10-22 DIAGNOSIS — Z87891 Personal history of nicotine dependence: Secondary | ICD-10-CM | POA: Diagnosis not present

## 2020-10-22 DIAGNOSIS — T8141XA Infection following a procedure, superficial incisional surgical site, initial encounter: Secondary | ICD-10-CM | POA: Diagnosis present

## 2020-10-22 HISTORY — PX: INCISION AND DRAINAGE: SHX5863

## 2020-10-22 LAB — CBC WITH DIFFERENTIAL/PLATELET
Abs Immature Granulocytes: 0.04 10*3/uL (ref 0.00–0.07)
Basophils Absolute: 0.1 10*3/uL (ref 0.0–0.1)
Basophils Relative: 1 %
Eosinophils Absolute: 0.3 10*3/uL (ref 0.0–0.5)
Eosinophils Relative: 3 %
HCT: 42.3 % (ref 39.0–52.0)
Hemoglobin: 13.9 g/dL (ref 13.0–17.0)
Immature Granulocytes: 0 %
Lymphocytes Relative: 30 %
Lymphs Abs: 2.8 10*3/uL (ref 0.7–4.0)
MCH: 28.7 pg (ref 26.0–34.0)
MCHC: 32.9 g/dL (ref 30.0–36.0)
MCV: 87.2 fL (ref 80.0–100.0)
Monocytes Absolute: 0.8 10*3/uL (ref 0.1–1.0)
Monocytes Relative: 9 %
Neutro Abs: 5.3 10*3/uL (ref 1.7–7.7)
Neutrophils Relative %: 57 %
Platelets: 211 10*3/uL (ref 150–400)
RBC: 4.85 MIL/uL (ref 4.22–5.81)
RDW: 13.4 % (ref 11.5–15.5)
WBC: 9.3 10*3/uL (ref 4.0–10.5)
nRBC: 0 % (ref 0.0–0.2)

## 2020-10-22 LAB — MAGNESIUM: Magnesium: 1.6 mg/dL — ABNORMAL LOW (ref 1.7–2.4)

## 2020-10-22 LAB — RESP PANEL BY RT-PCR (FLU A&B, COVID) ARPGX2
Influenza A by PCR: NEGATIVE
Influenza B by PCR: NEGATIVE
SARS Coronavirus 2 by RT PCR: NEGATIVE

## 2020-10-22 LAB — GLUCOSE, CAPILLARY
Glucose-Capillary: 129 mg/dL — ABNORMAL HIGH (ref 70–99)
Glucose-Capillary: 147 mg/dL — ABNORMAL HIGH (ref 70–99)
Glucose-Capillary: 245 mg/dL — ABNORMAL HIGH (ref 70–99)
Glucose-Capillary: 217 mg/dL — ABNORMAL HIGH (ref 70–99)

## 2020-10-22 LAB — BASIC METABOLIC PANEL
Anion gap: 8 (ref 5–15)
BUN: 12 mg/dL (ref 8–23)
CO2: 30 mmol/L (ref 22–32)
Calcium: 8.8 mg/dL — ABNORMAL LOW (ref 8.9–10.3)
Chloride: 99 mmol/L (ref 98–111)
Creatinine, Ser: 0.8 mg/dL (ref 0.61–1.24)
GFR, Estimated: >60 mL/min (ref >60)
Glucose, Bld: 127 mg/dL — ABNORMAL HIGH (ref 70–99)
Potassium: 3.3 mmol/L — ABNORMAL LOW (ref 3.5–5.1)
Sodium: 137 mmol/L (ref 135–145)

## 2020-10-22 LAB — C-REACTIVE PROTEIN: CRP: 2.4 mg/dL — ABNORMAL HIGH (ref <1.0)

## 2020-10-22 LAB — SEDIMENTATION RATE: Sed Rate: 10 mm/hr (ref 0–20)

## 2020-10-22 SURGERY — INCISION AND DRAINAGE
Anesthesia: General | Site: Back

## 2020-10-22 MED ORDER — VANCOMYCIN HCL 1750 MG/350ML IV SOLN
1750.0000 mg | Freq: Once | INTRAVENOUS | Status: AC
  Administered 2020-10-22: 1750 mg via INTRAVENOUS
  Filled 2020-10-22: qty 350

## 2020-10-22 MED ORDER — HYDROMORPHONE HCL 1 MG/ML IJ SOLN
INTRAMUSCULAR | Status: AC
  Filled 2020-10-22: qty 1

## 2020-10-22 MED ORDER — OMEPRAZOLE MAGNESIUM 20 MG PO TBEC: 20.0000 mg | DELAYED_RELEASE_TABLET | Freq: Every day | ORAL | Status: DC

## 2020-10-22 MED ORDER — GADOBUTROL 1 MMOL/ML IV SOLN
7.0000 mL | Freq: Once | INTRAVENOUS | Status: AC | PRN
  Administered 2020-10-22: 7.5 mL via INTRAVENOUS

## 2020-10-22 MED ORDER — OXYCODONE-ACETAMINOPHEN 5-325 MG PO TABS
1.0000 | ORAL_TABLET | Freq: Four times a day (QID) | ORAL | Status: DC | PRN
  Administered 2020-10-22 – 2020-10-24 (×6): 1 via ORAL
  Filled 2020-10-22 (×6): qty 1

## 2020-10-22 MED ORDER — ACETAMINOPHEN 325 MG PO TABS
650.0000 mg | ORAL_TABLET | Freq: Four times a day (QID) | ORAL | Status: DC | PRN
  Administered 2020-10-22: 650 mg via ORAL
  Filled 2020-10-22: qty 2

## 2020-10-22 MED ORDER — ROCURONIUM BROMIDE 100 MG/10ML IV SOLN
INTRAVENOUS | Status: DC | PRN
  Administered 2020-10-22: 50 mg via INTRAVENOUS

## 2020-10-22 MED ORDER — HYDROMORPHONE HCL 1 MG/ML IJ SOLN
INTRAMUSCULAR | Status: AC
  Administered 2020-10-22: 0.5 mg via INTRAVENOUS
  Filled 2020-10-22: qty 1

## 2020-10-22 MED ORDER — FENTANYL CITRATE (PF) 100 MCG/2ML IJ SOLN
INTRAMUSCULAR | Status: DC | PRN
  Administered 2020-10-22 (×4): 50 ug via INTRAVENOUS

## 2020-10-22 MED ORDER — MIDAZOLAM HCL 2 MG/2ML IJ SOLN
INTRAMUSCULAR | Status: AC
  Filled 2020-10-22: qty 2

## 2020-10-22 MED ORDER — LIDOCAINE HCL (PF) 2 % IJ SOLN
INTRAMUSCULAR | Status: AC
  Filled 2020-10-22: qty 5

## 2020-10-22 MED ORDER — POTASSIUM CHLORIDE 10 MEQ/100ML IV SOLN
10.0000 meq | INTRAVENOUS | Status: AC
  Administered 2020-10-22 (×2): 10 meq via INTRAVENOUS
  Filled 2020-10-22 (×2): qty 100

## 2020-10-22 MED ORDER — FENTANYL CITRATE (PF) 100 MCG/2ML IJ SOLN
INTRAMUSCULAR | Status: AC
  Filled 2020-10-22: qty 2

## 2020-10-22 MED ORDER — TAMSULOSIN HCL 0.4 MG PO CAPS
0.4000 mg | ORAL_CAPSULE | Freq: Every day | ORAL | Status: DC
  Administered 2020-10-23 – 2020-10-27 (×5): 0.4 mg via ORAL
  Filled 2020-10-22 (×5): qty 1

## 2020-10-22 MED ORDER — DEXTROSE 5 % IV SOLN
INTRAVENOUS | Status: DC | PRN
  Administered 2020-10-22: 2 g via INTRAVENOUS

## 2020-10-22 MED ORDER — MIDAZOLAM HCL 2 MG/2ML IJ SOLN
INTRAMUSCULAR | Status: DC | PRN
  Administered 2020-10-22: 2 mg via INTRAVENOUS

## 2020-10-22 MED ORDER — MAGNESIUM SULFATE 2 GM/50ML IV SOLN
2.0000 g | Freq: Once | INTRAVENOUS | Status: AC
  Administered 2020-10-22: 2 g via INTRAVENOUS
  Filled 2020-10-22: qty 50

## 2020-10-22 MED ORDER — SODIUM CHLORIDE 0.9 % IV SOLN: INTRAVENOUS | Status: DC | PRN

## 2020-10-22 MED ORDER — CEFAZOLIN SODIUM-DEXTROSE 2-3 GM-%(50ML) IV SOLR
INTRAVENOUS | Status: DC | PRN
  Administered 2020-10-22: 2 g via INTRAVENOUS

## 2020-10-22 MED ORDER — FENTANYL CITRATE (PF) 100 MCG/2ML IJ SOLN
INTRAMUSCULAR | Status: AC
  Administered 2020-10-22: 50 ug
  Filled 2020-10-22: qty 2

## 2020-10-22 MED ORDER — VANCOMYCIN HCL 1250 MG/250ML IV SOLN
1250.0000 mg | Freq: Two times a day (BID) | INTRAVENOUS | Status: DC
  Administered 2020-10-23 – 2020-10-25 (×5): 1250 mg via INTRAVENOUS
  Filled 2020-10-22 (×6): qty 250

## 2020-10-22 MED ORDER — HYDROMORPHONE HCL 1 MG/ML IJ SOLN
0.5000 mg | INTRAMUSCULAR | Status: DC | PRN
  Administered 2020-10-22 (×3): 0.5 mg via INTRAVENOUS

## 2020-10-22 MED ORDER — PANTOPRAZOLE SODIUM 40 MG PO TBEC
40.0000 mg | DELAYED_RELEASE_TABLET | Freq: Every day | ORAL | Status: DC
  Administered 2020-10-23 – 2020-10-27 (×5): 40 mg via ORAL
  Filled 2020-10-22 (×5): qty 1

## 2020-10-22 MED ORDER — INSULIN ASPART 100 UNIT/ML IJ SOLN
0.0000 [IU] | Freq: Four times a day (QID) | INTRAMUSCULAR | Status: DC
  Administered 2020-10-22: 1 [IU] via SUBCUTANEOUS
  Administered 2020-10-22: 3 [IU] via SUBCUTANEOUS
  Filled 2020-10-22 (×2): qty 1

## 2020-10-22 MED ORDER — SODIUM CHLORIDE 0.9 % IV SOLN
2.0000 g | Freq: Three times a day (TID) | INTRAVENOUS | Status: DC
  Administered 2020-10-22 – 2020-10-27 (×15): 2 g via INTRAVENOUS
  Filled 2020-10-22 (×18): qty 2

## 2020-10-22 MED ORDER — ATORVASTATIN CALCIUM 20 MG PO TABS
40.0000 mg | ORAL_TABLET | Freq: Every day | ORAL | Status: DC
  Administered 2020-10-22 – 2020-10-26 (×5): 40 mg via ORAL
  Filled 2020-10-22 (×5): qty 2

## 2020-10-22 MED ORDER — ONDANSETRON HCL 4 MG/2ML IJ SOLN: 4.0000 mg | Freq: Four times a day (QID) | INTRAMUSCULAR | Status: DC | PRN

## 2020-10-22 MED ORDER — HYDROMORPHONE HCL 1 MG/ML IJ SOLN
0.5000 mg | INTRAMUSCULAR | Status: DC | PRN
  Administered 2020-10-22 – 2020-10-24 (×17): 0.5 mg via INTRAVENOUS
  Filled 2020-10-22 (×18): qty 1

## 2020-10-22 MED ORDER — NALOXONE HCL 2 MG/2ML IJ SOSY
0.4000 mg | PREFILLED_SYRINGE | INTRAMUSCULAR | Status: DC | PRN
  Filled 2020-10-22: qty 2

## 2020-10-22 MED ORDER — ENOXAPARIN SODIUM 40 MG/0.4ML IJ SOSY
40.0000 mg | PREFILLED_SYRINGE | INTRAMUSCULAR | Status: DC
  Administered 2020-10-23 – 2020-10-27 (×5): 40 mg via SUBCUTANEOUS
  Filled 2020-10-22 (×5): qty 0.4

## 2020-10-22 MED ORDER — INSULIN ASPART 100 UNIT/ML IJ SOLN
0.0000 [IU] | Freq: Every day | INTRAMUSCULAR | Status: DC
  Administered 2020-10-22: 2 [IU] via SUBCUTANEOUS
  Filled 2020-10-22: qty 1

## 2020-10-22 MED ORDER — HYDROMORPHONE HCL 2 MG PO TABS
4.0000 mg | ORAL_TABLET | ORAL | Status: DC | PRN
  Administered 2020-10-22 – 2020-10-27 (×18): 4 mg via ORAL
  Filled 2020-10-22 (×20): qty 2

## 2020-10-22 MED ORDER — DEXAMETHASONE SODIUM PHOSPHATE 10 MG/ML IJ SOLN
INTRAMUSCULAR | Status: DC | PRN
  Administered 2020-10-22: 5 mg via INTRAVENOUS

## 2020-10-22 MED ORDER — METHOCARBAMOL 500 MG PO TABS
500.0000 mg | ORAL_TABLET | Freq: Four times a day (QID) | ORAL | Status: DC | PRN
  Administered 2020-10-22 – 2020-10-25 (×9): 1000 mg via ORAL
  Administered 2020-10-26: 500 mg via ORAL
  Filled 2020-10-22 (×8): qty 2
  Filled 2020-10-22: qty 1
  Filled 2020-10-22 (×2): qty 2

## 2020-10-22 MED ORDER — OXYCODONE HCL 5 MG PO TABS
5.0000 mg | ORAL_TABLET | Freq: Four times a day (QID) | ORAL | Status: DC | PRN
  Administered 2020-10-24 (×2): 5 mg via ORAL
  Filled 2020-10-22 (×2): qty 1

## 2020-10-22 MED ORDER — METHOCARBAMOL 1000 MG/10ML IJ SOLN
500.0000 mg | Freq: Once | INTRAVENOUS | Status: AC
  Administered 2020-10-22: 500 mg via INTRAVENOUS
  Filled 2020-10-22: qty 5

## 2020-10-22 MED ORDER — ZOLPIDEM TARTRATE 5 MG PO TABS
5.0000 mg | ORAL_TABLET | Freq: Every evening | ORAL | Status: DC | PRN
  Administered 2020-10-22 – 2020-10-26 (×4): 5 mg via ORAL
  Filled 2020-10-22 (×4): qty 1

## 2020-10-22 MED ORDER — VANCOMYCIN HCL 1250 MG/250ML IV SOLN
1250.0000 mg | Freq: Two times a day (BID) | INTRAVENOUS | Status: DC
  Filled 2020-10-22 (×2): qty 250

## 2020-10-22 MED ORDER — ROCURONIUM BROMIDE 10 MG/ML (PF) SYRINGE
PREFILLED_SYRINGE | INTRAVENOUS | Status: AC
  Filled 2020-10-22: qty 10

## 2020-10-22 MED ORDER — KETAMINE HCL 50 MG/ML IJ SOLN
INTRAMUSCULAR | Status: DC | PRN
  Administered 2020-10-22: 30 mg via INTRAMUSCULAR

## 2020-10-22 MED ORDER — INSULIN ASPART 100 UNIT/ML IJ SOLN
0.0000 [IU] | Freq: Three times a day (TID) | INTRAMUSCULAR | Status: DC
  Administered 2020-10-23: 3 [IU] via SUBCUTANEOUS
  Administered 2020-10-23 – 2020-10-24 (×3): 2 [IU] via SUBCUTANEOUS
  Administered 2020-10-25 (×2): 1 [IU] via SUBCUTANEOUS
  Administered 2020-10-26: 3 [IU] via SUBCUTANEOUS
  Administered 2020-10-26 (×2): 2 [IU] via SUBCUTANEOUS
  Administered 2020-10-27: 3 [IU] via SUBCUTANEOUS
  Filled 2020-10-22 (×10): qty 1

## 2020-10-22 MED ORDER — FENTANYL CITRATE (PF) 100 MCG/2ML IJ SOLN
25.0000 ug | INTRAMUSCULAR | Status: DC | PRN
  Administered 2020-10-22 (×2): 50 ug via INTRAVENOUS

## 2020-10-22 MED ORDER — POTASSIUM CHLORIDE CRYS ER 20 MEQ PO TBCR: 40.0000 meq | EXTENDED_RELEASE_TABLET | Freq: Once | ORAL | Status: DC

## 2020-10-22 MED ORDER — VANCOMYCIN HCL 1000 MG IV SOLR
INTRAVENOUS | Status: DC | PRN
  Administered 2020-10-22 (×2): 1000 mg

## 2020-10-22 MED ORDER — SUGAMMADEX SODIUM 200 MG/2ML IV SOLN
INTRAVENOUS | Status: DC | PRN
  Administered 2020-10-22: 200 mg via INTRAVENOUS

## 2020-10-22 MED ORDER — CELECOXIB 200 MG PO CAPS
200.0000 mg | ORAL_CAPSULE | Freq: Two times a day (BID) | ORAL | Status: DC
  Administered 2020-10-22 – 2020-10-27 (×10): 200 mg via ORAL
  Filled 2020-10-22 (×11): qty 1

## 2020-10-22 MED ORDER — POTASSIUM CHLORIDE 10 MEQ/100ML IV SOLN
10.0000 meq | INTRAVENOUS | Status: AC
  Administered 2020-10-22: 10 meq via INTRAVENOUS
  Filled 2020-10-22: qty 100

## 2020-10-22 MED ORDER — LIDOCAINE HCL (CARDIAC) PF 100 MG/5ML IV SOSY
PREFILLED_SYRINGE | INTRAVENOUS | Status: DC | PRN
  Administered 2020-10-22: 40 mg via INTRAVENOUS

## 2020-10-22 MED ORDER — ONDANSETRON HCL 4 MG/2ML IJ SOLN
INTRAMUSCULAR | Status: AC
  Filled 2020-10-22: qty 2

## 2020-10-22 MED ORDER — PROPOFOL 10 MG/ML IV BOLUS
INTRAVENOUS | Status: DC | PRN
  Administered 2020-10-22: 140 mg via INTRAVENOUS

## 2020-10-22 MED ORDER — LACTATED RINGERS IV SOLN: INTRAVENOUS | Status: AC

## 2020-10-22 MED ORDER — PROPOFOL 10 MG/ML IV BOLUS
INTRAVENOUS | Status: AC
  Filled 2020-10-22: qty 20

## 2020-10-22 MED ORDER — DEXAMETHASONE SODIUM PHOSPHATE 10 MG/ML IJ SOLN
INTRAMUSCULAR | Status: AC
  Filled 2020-10-22: qty 1

## 2020-10-22 MED ORDER — ONDANSETRON HCL 4 MG/2ML IJ SOLN
INTRAMUSCULAR | Status: DC | PRN
  Administered 2020-10-22: 4 mg via INTRAVENOUS

## 2020-10-22 MED ORDER — ONDANSETRON HCL 4 MG/2ML IJ SOLN
4.0000 mg | Freq: Once | INTRAMUSCULAR | Status: DC | PRN
Start: 2020-10-22 — End: 2020-10-22

## 2020-10-22 MED ORDER — HYDROMORPHONE HCL 1 MG/ML IJ SOLN
1.0000 mg | Freq: Once | INTRAMUSCULAR | Status: AC
  Administered 2020-10-22: 1 mg via INTRAVENOUS
  Filled 2020-10-22: qty 1

## 2020-10-22 MED ORDER — INSULIN ASPART 100 UNIT/ML IJ SOLN
0.0000 [IU] | Freq: Three times a day (TID) | INTRAMUSCULAR | Status: DC
Start: 2020-10-23 — End: 2020-10-22

## 2020-10-22 MED ORDER — KETAMINE HCL 50 MG/ML IJ SOLN
INTRAMUSCULAR | Status: AC
  Filled 2020-10-22: qty 1

## 2020-10-22 MED ORDER — KETOROLAC TROMETHAMINE 15 MG/ML IJ SOLN
15.0000 mg | Freq: Once | INTRAMUSCULAR | Status: AC
  Administered 2020-10-22: 15 mg via INTRAVENOUS
  Filled 2020-10-22: qty 1

## 2020-10-22 MED ORDER — LORAZEPAM 2 MG/ML IJ SOLN
1.0000 mg | Freq: Once | INTRAMUSCULAR | Status: AC | PRN
  Administered 2020-10-22: 1 mg via INTRAVENOUS
  Filled 2020-10-22: qty 1

## 2020-10-22 SURGICAL SUPPLY — 77 items
BUR NEURO DRILL SOFT 3.0X3.8M (BURR) IMPLANT
CHLORAPREP W/TINT 26 (MISCELLANEOUS) ×3 IMPLANT
CORD BIP STRL DISP 12FT (MISCELLANEOUS) ×3 IMPLANT
COUNTER NEEDLE 20/40 LG (NEEDLE) ×3 IMPLANT
COVER BACK TABLE REUSABLE LG (DRAPES) ×3 IMPLANT
COVER WAND RF STERILE (DRAPES) ×3 IMPLANT
CUP MEDICINE 2OZ PLAST GRAD ST (MISCELLANEOUS) ×3 IMPLANT
DERMABOND ADVANCED (GAUZE/BANDAGES/DRESSINGS) ×1
DERMABOND ADVANCED .7 DNX12 (GAUZE/BANDAGES/DRESSINGS) ×2 IMPLANT
DRAPE C-ARMOR (DRAPES) IMPLANT
DRAPE INCISE IOBAN 66X45 STRL (DRAPES) ×3 IMPLANT
DRAPE LAPAROTOMY 100X77 ABD (DRAPES) ×6 IMPLANT
DRAPE MICROSCOPE SPINE 48X150 (DRAPES) IMPLANT
DRAPE SURG 17X11 SM STRL (DRAPES) ×12 IMPLANT
DRSG OPSITE POSTOP 4X6 (GAUZE/BANDAGES/DRESSINGS) IMPLANT
DRSG TEGADERM 2-3/8X2-3/4 SM (GAUZE/BANDAGES/DRESSINGS) IMPLANT
DRSG TEGADERM 2X2.25 PEDS (GAUZE/BANDAGES/DRESSINGS) ×9 IMPLANT
DRSG TEGADERM 4X4.75 (GAUZE/BANDAGES/DRESSINGS) IMPLANT
DRSG TEGADERM 6X8 (GAUZE/BANDAGES/DRESSINGS) IMPLANT
DRSG TELFA 3X8 NADH (GAUZE/BANDAGES/DRESSINGS) IMPLANT
DRSG TELFA 4X3 1S NADH ST (GAUZE/BANDAGES/DRESSINGS) IMPLANT
ELECT CAUTERY BLADE TIP 2.5 (TIP) ×6
ELECT EZSTD 165MM 6.5IN (MISCELLANEOUS) ×3
ELECT REM PT RETURN 9FT ADLT (ELECTROSURGICAL) ×6
ELECTRODE CAUTERY BLDE TIP 2.5 (TIP) ×4 IMPLANT
ELECTRODE EZSTD 165MM 6.5IN (MISCELLANEOUS) ×2 IMPLANT
ELECTRODE REM PT RTRN 9FT ADLT (ELECTROSURGICAL) ×4 IMPLANT
FEE INTRAOP CADWELL SUPPLY NCS (MISCELLANEOUS) ×2 IMPLANT
FEE INTRAOP MONITOR IMPULS NCS (MISCELLANEOUS) IMPLANT
GLOVE SURG SYN 8.5  E (GLOVE) ×2
GLOVE SURG SYN 8.5 E (GLOVE) ×4 IMPLANT
GOWN SRG XL LVL 3 NONREINFORCE (GOWNS) ×4 IMPLANT
GOWN STRL NON-REIN TWL XL LVL3 (GOWNS) ×2
GOWN STRL REUS W/ TWL XL LVL3 (GOWN DISPOSABLE) ×2 IMPLANT
GOWN STRL REUS W/TWL XL LVL3 (GOWN DISPOSABLE) ×1
GRADUATE 1200CC STRL 31836 (MISCELLANEOUS) ×3 IMPLANT
HANDLE YANKAUER SUCT BULB TIP (MISCELLANEOUS) ×3 IMPLANT
HEMOVAC 400CC 10FR (MISCELLANEOUS) ×9 IMPLANT
INTRAOP CADWELL SUPPLY FEE NCS (MISCELLANEOUS) ×2
INTRAOP DISP SUPPLY FEE NCS (MISCELLANEOUS) ×1
INTRAOP MONITOR FEE IMPULS NCS (MISCELLANEOUS)
INTRAOP MONITOR FEE IMPULSE (MISCELLANEOUS)
KIT PREVENA INCISION MGT20CM45 (CANNISTER) ×3 IMPLANT
KIT SPINAL PRONEVIEW (KITS) ×3 IMPLANT
KIT TURNOVER KIT A (KITS) ×3 IMPLANT
KNIFE BAYONET SHORT DISCETOMY (MISCELLANEOUS) IMPLANT
MANIFOLD NEPTUNE II (INSTRUMENTS) ×3 IMPLANT
MARKER SKIN DUAL TIP RULER LAB (MISCELLANEOUS) ×3 IMPLANT
NDL SAFETY ECLIPSE 18X1.5 (NEEDLE) ×2 IMPLANT
NEEDLE HYPO 18GX1.5 SHARP (NEEDLE) ×1
NEEDLE HYPO 22GX1.5 SAFETY (NEEDLE) ×3 IMPLANT
NS IRRIG 1000ML POUR BTL (IV SOLUTION) ×9 IMPLANT
NS IRRIG 500ML POUR BTL (IV SOLUTION) ×6 IMPLANT
PACK LAMINECTOMY NEURO (CUSTOM PROCEDURE TRAY) ×3 IMPLANT
PAD ARMBOARD 7.5X6 YLW CONV (MISCELLANEOUS) ×9 IMPLANT
PENCIL ELECTRO HAND CTR (MISCELLANEOUS) ×3 IMPLANT
SPOGE SURGIFLO 8M (HEMOSTASIS) ×1
SPONGE DRAIN TRACH 4X4 STRL 2S (GAUZE/BANDAGES/DRESSINGS) ×9 IMPLANT
SPONGE GAUZE 2X2 8PLY STRL LF (GAUZE/BANDAGES/DRESSINGS) IMPLANT
SPONGE LAP 4X18 RFD (DISPOSABLE) ×3 IMPLANT
SPONGE SURGIFLO 8M (HEMOSTASIS) ×2 IMPLANT
STAPLER SKIN PROX 35W (STAPLE) IMPLANT
SUCTION FRAZIER HANDLE 10FR (MISCELLANEOUS) ×1
SUCTION TUBE FRAZIER 10FR DISP (MISCELLANEOUS) ×2 IMPLANT
SUT DVC VLOC 3-0 CL 6 P-12 (SUTURE) IMPLANT
SUT ETHILON 3-0 FS-10 30 BLK (SUTURE) ×12
SUT VIC AB 0 CT1 27 (SUTURE) ×1
SUT VIC AB 0 CT1 27XCR 8 STRN (SUTURE) ×2 IMPLANT
SUT VIC AB 0 CT1 36 (SUTURE) ×9 IMPLANT
SUT VIC AB 2-0 CT1 18 (SUTURE) ×9 IMPLANT
SUT VIC AB 2-0 CT1 36 (SUTURE) ×9 IMPLANT
SUT VICRYL 3-0 CR8 SH (SUTURE) ×3 IMPLANT
SUTURE EHLN 3-0 FS-10 30 BLK (SUTURE) ×8 IMPLANT
SWAB CULTURE AMIES ANAERIB BLU (MISCELLANEOUS) ×6 IMPLANT
SYR 30ML LL (SYRINGE) ×6 IMPLANT
TOWEL OR 17X26 4PK STRL BLUE (TOWEL DISPOSABLE) ×9 IMPLANT
TUBING CONNECTING 10 (TUBING) ×3 IMPLANT

## 2020-10-22 NOTE — Anesthesia Preprocedure Evaluation (Signed)
Anesthesia Evaluation  Patient identified by MRN, date of birth, ID band Patient awake    Reviewed: Allergy & Precautions, H&P , NPO status , Patient's Chart, lab work & pertinent test results, reviewed documented beta blocker date and time   Airway Mallampati: III  TM Distance: >3 FB Neck ROM: full    Dental  (+) Teeth Intact   Pulmonary neg pulmonary ROS, shortness of breath,    Pulmonary exam normal        Cardiovascular Exercise Tolerance: Poor hypertension, On Medications negative cardio ROS Normal cardiovascular exam Rhythm:regular Rate:Normal     Neuro/Psych  Headaches,  Neuromuscular disease negative neurological ROS  negative psych ROS   GI/Hepatic negative GI ROS, Neg liver ROS, GERD  Medicated,  Endo/Other  negative endocrine ROSdiabetes  Renal/GU Renal diseasenegative Renal ROS  negative genitourinary   Musculoskeletal   Abdominal   Peds  Hematology negative hematology ROS (+)   Anesthesia Other Findings Past Medical History: No date: Arthritis     Comment:  Osteo vs rheumatoid ? 05/05/2013: Back ache No date: Diabetes mellitus without complication (HCC) No date: GERD (gastroesophageal reflux disease) No date: Headache     Comment:  MIGRAINES No date: History of kidney stones No date: Hyperlipemia No date: Hypertension No date: Kidney stones No date: Kidney stones No date: Nephrolithiasis No date: Shortness of breath dyspnea Past Surgical History: No date: APPENDECTOMY 2015: BACK SURGERY     Comment:  x3 08/09/2016: BACK SURGERY 2019: BACK SURGERY; Bilateral 2000: CHOLECYSTECTOMY 04/11/2015: COLONOSCOPY WITH PROPOFOL; N/A     Comment:  Procedure: COLONOSCOPY WITH PROPOFOL;  Surgeon: Manya Silvas, MD;  Location: [facility] ENDOSCOPY;  Service:               Endoscopy;  Laterality: N/A; 08/27/2017: CYSTOSCOPY WITH URETEROSCOPY AND STENT PLACEMENT; Right     Comment:  Procedure:  CYSTOSCOPY WITH URETEROSCOPY AND STENT               PLACEMENT;  Surgeon: Abbie Sons, MD;  Location:               [facility] ORS;  Service: Urology;  Laterality: Right; 04/11/2015: ESOPHAGOGASTRODUODENOSCOPY (EGD) WITH PROPOFOL; N/A     Comment:  Procedure: ESOPHAGOGASTRODUODENOSCOPY (EGD) WITH               PROPOFOL;  Surgeon: Manya Silvas, MD;  Location: [facility]              ENDOSCOPY;  Service: Endoscopy;  Laterality: N/A; 12/18/2017: LAPAROSCOPIC APPENDECTOMY; N/A     Comment:  Procedure: APPENDECTOMY LAPAROSCOPIC;  Surgeon: Florene Glen, MD;  Location: [facility] ORS;  Service: General;                Laterality: N/A; 09/14/2020: POSTERIOR LUMBAR FUSION 4 LEVEL; N/A     Comment:  Procedure: OPEN T10-PELVIS POSTERIOR SPINAL FUSION               (HARDWARE ALREADY PRESENT AT L2-S2); L1-2 POSTERIOR               SPINAL DECOMPRESSION;  Surgeon: Meade Maw, MD;                Location: [facility] ORS;  Service: Neurosurgery;  Laterality:               N/A; 2016:  SHOULDER SURGERY; Right     Comment:  X2 06/2015: SHOULDER SURGERY; Left BMI    Body Mass Index: 28.82 kg/m     Reproductive/Obstetrics negative OB ROS                             Anesthesia Physical Anesthesia Plan  ASA: III and emergent  Anesthesia Plan: General ETT   Post-op Pain Management:    Induction:   PONV Risk Score and Plan: 3  Airway Management Planned:   Additional Equipment:   Intra-op Plan:   Post-operative Plan:   Informed Consent: I have reviewed the patients History and Physical, chart, labs and discussed the procedure including the risks, benefits and alternatives for the proposed anesthesia with the patient or authorized representative who has indicated his/her understanding and acceptance.     Dental Advisory Given  Plan Discussed with: CRNA  Anesthesia Plan Comments:         Anesthesia Quick Evaluation

## 2020-10-22 NOTE — Progress Notes (Signed)
Pt return to room via bed from PACU A&Ox4.  VSS. Bedside report received from PACU RN.  Hemovac x 3 and wound vac C/D/I  to back.  Rates pain 8/10.  Will review post op orders and administer pain med and monitor for relief.  All personal items and call bell within reach.

## 2020-10-22 NOTE — Anesthesia Procedure Notes (Signed)
Procedure Name: Intubation Date/Time: 10/22/2020 11:16 AM Performed by: Orion Crook, CRNA Pre-anesthesia Checklist: Patient identified, Emergency Drugs available, Suction available, Patient being monitored and Timeout performed Patient Re-evaluated:Patient Re-evaluated prior to induction Oxygen Delivery Method: Circle system utilized Preoxygenation: Pre-oxygenation with 100% oxygen Induction Type: IV induction Ventilation: Mask ventilation without difficulty Laryngoscope Size: McGraph and 4 Grade View: Grade I Tube type: Oral Tube size: 7.5 mm Number of attempts: 1 Airway Equipment and Method: Stylet Placement Confirmation: ETT inserted through vocal cords under direct vision,  positive ETCO2 and breath sounds checked- equal and bilateral Secured at: 23 cm Tube secured with: Tape Dental Injury: Teeth and Oropharynx as per pre-operative assessment

## 2020-10-22 NOTE — H&P (Signed)
History and Physical    PLEASE NOTE THAT DRAGON DICTATION SOFTWARE WAS USED IN THE CONSTRUCTION OF THIS NOTE.   MADEX SEALS SNK:[id number] DOB: 1955-12-30 DOA: 10/21/2020  [facility]P: [facility] [place] Patient coming from: home   I have personally briefly reviewed patient's old medical records in [place]  Chief Complaint: Low back incisional redness  HPI: Howard Murphy is a 65 y.o. male with medical history significant for lumbar spinal stenosis status post recent open T10 -pelvis posterior spinal fusion and L1-L2 posterior spinal decompression, hyperlipidemia, type 2 diabetes mellitus, who is admitted to [facility] on 10/21/2020 with concern for thoracic/lumbar wound infection of midline incisional surgical site after presenting from home to [facility] ED complaining of incisional surgical site erythema.   In the setting of lumbar spinal stenosis, the patient underwent open T10 -pelvis posterior spinal fusion and L1-L2 posterior spinal decompression on 09/14/2020 with Dr. Izora Ribas of neurosurgery.  The patient reports that this occurred approximately 2 years after preceding lumbar spine surgery, also performed by Dr. Izora Ribas.  Patient reports approximately 2 weeks of progressive erythema, tenderness, swelling associated with thoracic/lumbar midline incisional surgical site.  In the interval, the patient followed up in clinic with Dr. Izora Ribas on 10/13/2020, at which time he conveys that he was not experiencing any significant drainage from the site.  Conservative measures were elected, however, in the interval since this outpatient follow-up, the patient reports further progression of the incisional site erythema, swelling, tenderness as well as interval development of drainage, that has been intermittently purulent in nature.  The patient denies any new numbness or paresthesias involving the lower extremities, and denies any saddle anesthesia, new onset urinary  retention or fecal incontinence.  Denies any associated subjective fever, chills, rigors, or generalized myalgias.  The setting of the above, the patient contacted Dr Izora Ribas on 10/21/2020, with ensuing instructions to present to [facility] ED for further evaluation and management of suspected incisional surgical site infection.   The patient denies any recent chest pain, palpitations, diaphoresis, dizziness, presyncope, or syncope.  He also denies any recent shortness of breath, orthopnea, PND, or worsening of peripheral edema.  Denies any known underlying history of coronary artery disease or congestive heart failure.  He confirms that he is on no blood thinners as an outpatient, including no aspirin.  Denies any recent headache, neck stiffness, rhinitis, rhinorrhea, abdominal pain, diarrhea, rash.  He also denies any recent dysuria, gross hematuria, or change in urinary urgency/frequency.  No known recent COVID-19 exposures.     ED Course:  Vital signs in the ED were notable for the following: Temperature max 97.8, heart rate 80, blood pressure 124/75, respiratory rate 20.  Labs were notable for the following: BMP was notable for the following: Sodium 136, bicarbonate 28, creatinine 0.98, glucose 176.  ESR, CRP, and urinalysis have been ordered, with results currently pending.  CBC notable for white blood cell count 8600, hemoglobin 13.9, platelets 215.  Screening nasopharyngeal COVID-19/influenza PCR were checked in the ED this evening, with results currently pending.  Blood cultures x2 were also collected.   Patient's case was discussed with Dr. Izora Ribas of neurosurgery, who will formally consult.  He requested further imaging of the thoracic/lumbar spine with CT of the thoracic/lumbar spine without contrast as well as MRI of the thoracic/lumbar spine with and without contrast. He anticipates taking patient to OR later today (10/22/20 for washout procedure, and requested that patient be kept n.p.o. in  anticipation of this.  additionally, he requested that no antibiotics be given preoperatively unless the patient becomes septic.  He also asked that CRP, ESR, and urinalysis be checked preoperatively, and requested admission to the hospital service with plan for symptomatic/supportive management overnight leading up to anticipated washout procedure.   Of note, CT thoracic/lumbar spine without contrast and MRI of the thoracic/lumbar spine with and without contrast have been ordered, with results currently pending.  While in the ED, the following were administered: Dilaudid 1 mg IV x1 and Zofran 4 mg IV x1.     Review of Systems: As per HPI otherwise 10 point review of systems negative.   Past Medical History:  Diagnosis Date  . 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  . Back ache 05/05/2013  . Diabetes mellitus without complication ([place])   . GERD (gastroesophageal reflux disease)   . Headache    MIGRAINES  . History of kidney stones   . Hyperlipemia   . Hypertension   . Kidney stones   . Kidney stones   . Nephrolithiasis   . Shortness of breath dyspnea     Past Surgical History:  Procedure Laterality Date  . APPENDECTOMY    . BACK SURGERY  2015   x3  . BACK SURGERY  08/09/2016  . BACK SURGERY Bilateral 2019  . CHOLECYSTECTOMY  2000  . COLONOSCOPY WITH PROPOFOL N/A 04/11/2015   Procedure: COLONOSCOPY WITH PROPOFOL;  Surgeon: Manya Silvas, MD;  Location: [facility] ENDOSCOPY;  Service: Endoscopy;  Laterality: N/A;  . CYSTOSCOPY WITH URETEROSCOPY AND STENT PLACEMENT Right 08/27/2017   Procedure: CYSTOSCOPY WITH URETEROSCOPY AND STENT PLACEMENT;  Surgeon: Abbie Sons, MD;  Location: [facility] ORS;  Service: Urology;  Laterality: Right;  . ESOPHAGOGASTRODUODENOSCOPY (EGD) WITH PROPOFOL N/A 04/11/2015   Procedure: ESOPHAGOGASTRODUODENOSCOPY (EGD) WITH PROPOFOL;  Surgeon: Manya Silvas, MD;  Location[facility] ENDOSCOPY;  Service: Endoscopy;  Laterality: N/A;  . LAPAROSCOPIC APPENDECTOMY N/A  12/18/2017   Procedure: APPENDECTOMY LAPAROSCOPIC;  Surgeon: Florene Glen, MD;  Location: [facility] ORS;  Service: General;  Laterality: N/A;  . POSTERIOR LUMBAR FUSION 4 LEVEL N/A 09/14/2020   Procedure: OPEN T10-PELVIS POSTERIOR SPINAL FUSION (HARDWARE ALREADY PRESENT AT L2-S2); L1-2 POSTERIOR SPINAL DECOMPRESSION;  Surgeon: Meade Maw, MD;  Location: [facility] ORS;  Service: Neurosurgery;  Laterality: N/A;  . SHOULDER SURGERY Right 2016   X2  . SHOULDER SURGERY Left 06/2015    Social History:  reports that he has never smoked. He quit smokeless tobacco use about 7 years ago.  His smokeless tobacco use included chew. He reports that he does not drink alcohol and does not use drugs.   Allergies  Allergen Reactions  . Adhesive [Tape] Other (See Comments)    Honeycomb dressing, blisters    Family History  Problem Relation Age of Onset  . Cancer Mother        Breast CA  . Stroke Father   . Heart disease Father   . Cancer Sister   . Cancer Maternal Grandmother   . Cancer Sister   . Bladder Cancer Neg Hx   . Prostate cancer Neg Hx   . Kidney cancer Neg Hx      Prior to Admission medications   Medication Sig Start Date End Date Taking? Authorizing Provider  acetaminophen (TYLENOL) 500 MG tablet Take 2 tablets (1,000 mg total) by mouth every 6 (six) hours. 09/17/20   Meade Maw, MD  atorvastatin (LIPITOR) 40 MG tablet Take 1 tablet (40 mg total) by mouth at bedtime.  03/08/17   Lada, Satira Anis, MD  HYDROmorphone (DILAUDID) 4 MG tablet Take 1-2 tablets (4-8 mg total) by mouth every 6 (six) hours as needed for severe pain. 09/27/20   Domenic Polite, MD  JANUVIA 100 MG tablet Take 100 mg by mouth daily. 05/26/20   [provider]  JARDIANCE 25 MG TABS tablet Take 25 mg by mouth daily. 02/06/20   [provider]  lidocaine (LIDODERM) 5 % Place 1 patch onto the skin daily. Remove & Discard patch within 12 hours or as directed by MD 09/28/20   Domenic Polite, MD   meclizine (ANTIVERT) 25 MG tablet Take 25 mg by mouth every 6 (six) hours as needed for dizziness.    [provider]  omeprazole (PRILOSEC OTC) 20 MG tablet Take 1 tablet (20 mg total) by mouth daily for 20 days. 09/27/20 10/17/20  Domenic Polite, MD  oxyCODONE-acetaminophen (PERCOCET) 10-325 MG tablet Take 1 tablet by mouth every 6 (six) hours as needed for pain (Restart this only after you are out of Dilaudid). Restart this only after you are out of Dilaudid 10/12/20 11/11/20  Domenic Polite, MD  senna-docusate (SENOKOT-S) 8.6-50 MG tablet Take 1 tablet by mouth 2 (two) times daily. 09/27/20   Domenic Polite, MD  tamsulosin (FLOMAX) 0.4 MG CAPS capsule Take 1 capsule (0.4 mg total) by mouth daily. 11/04/18   Billey Co, MD  testosterone cypionate (DEPOTESTOSTERONE CYPIONATE) 200 MG/ML injection Inject 200 mg into the muscle every 7 (seven) days. 10/07/18   [provider]  UNABLE TO FIND Outpatient physical therapy 09/27/20   Domenic Polite, MD  zolpidem (AMBIEN) 10 MG tablet Take 10 mg by mouth at bedtime.    [provider]     Objective    Physical Exam: Vitals:   10/21/20 2117  BP: 124/75  Pulse: 80  Resp: 20  Temp: 97.8 F (36.6 C)  TempSrc: Oral  Weight: 83.5 kg  Height: '5\' 7"'  (1.702 m)    General: appears to be stated age; alert, oriented Skin: warm, dry, no rash Head:  AT/[place]:  Oral mucosa membranes appear dry, normal dentition Neck: supple; trachea midline Heart:  RRR; did not appreciate any M/R/G Lungs: CTAB, did not appreciate any wheezes, rales, or rhonchi Abdomen: + BS; soft, ND, NT Vascular: 2+ pedal pulses b/l; 2+ radial pulses b/l Back: T/L midline incisional site associated with erythema, tenderness and swelling Extremities: no peripheral edema, no muscle wasting Neuro: strength and sensation intact in upper and lower extremities b/l    Labs on Admission: I have personally reviewed following labs and imaging  studies  CBC: Recent Labs  Lab 10/21/20 2122  WBC 8.6  NEUTROABS 4.5  HGB 13.9  HCT 43.1  MCV 88.3  PLT 268   Basic Metabolic Panel: Recent Labs  Lab 10/21/20 2122  NA 136  K 3.6  CL 99  CO2 28  GLUCOSE 176*  BUN 16  CREATININE 0.98  CALCIUM 8.9   GFR: Estimated Creatinine Clearance: 77.7 mL/min (by C-G formula based on SCr of 0.98 mg/dL). Liver Function Tests: No results for input(s): AST, ALT, ALKPHOS, BILITOT, PROT, ALBUMIN in the last 168 hours. No results for input(s): LIPASE, AMYLASE in the last 168 hours. No results for input(s): AMMONIA in the last 168 hours. Coagulation Profile: No results for input(s): INR, PROTIME in the last 168 hours. Cardiac Enzymes: No results for input(s): CKTOTAL, CKMB, CKMBINDEX, TROPONINI in the last 168 hours. BNP (last 3 results) No results for  input(s): PROBNP in the last 8760 hours. HbA1C: No results for input(s): HGBA1C in the last 72 hours. CBG: No results for input(s): GLUCAP in the last 168 hours. Lipid Profile: No results for input(s): CHOL, HDL, LDLCALC, TRIG, CHOLHDL, LDLDIRECT in the last 72 hours. Thyroid Function Tests: No results for input(s): TSH, T4TOTAL, FREET4, T3FREE, THYROIDAB in the last 72 hours. Anemia Panel: No results for input(s): VITAMINB12, FOLATE, FERRITIN, TIBC, IRON, RETICCTPCT in the last 72 hours. Urine analysis:    Component Value Date/Time   COLORURINE STRAW (A) 09/24/2020 1922   APPEARANCEUR CLEAR (A) 09/24/2020 1922   APPEARANCEUR Clear 08/15/2017 1347   LABSPEC 1.003 (L) 09/24/2020 1922   LABSPEC 1.015 03/23/2014 2003   PHURINE 7.0 09/24/2020 1922   GLUCOSEU >=500 (A) 09/24/2020 1922   GLUCOSEU 50 mg/dL 03/23/2014 2003   HGBUR NEGATIVE 09/24/2020 [place] NEGATIVE 09/24/2020 1922   BILIRUBINUR Negative 08/15/2017 1347   BILIRUBINUR Negative 03/23/2014 2003   KETONESUR NEGATIVE 09/24/2020 [place] NEGATIVE 09/24/2020 1922   UROBILINOGEN 1.0 02/23/2015 2347    NITRITE NEGATIVE 09/24/2020 1922   LEUKOCYTESUR NEGATIVE 09/24/2020 1922   LEUKOCYTESUR Negative 03/23/2014 2003    Radiological Exams on Admission: No results found.    Assessment/Plan   Howard Murphy is a 65 y.o. male with medical history significant for lumbar spinal stenosis status post recent open T10 -pelvis posterior spinal fusion and L1-L2 posterior spinal decompression, hyperlipidemia, type 2 diabetes mellitus, who is admitted to [facility] on 10/21/2020 with concern for thoracic/lumbar wound infection of midline incisional surgical site after presenting from home to [facility] ED complaining of incisional surgical site erythema.    Principal Problem:   Wound infection after surgery Active Problems:   GERD (gastroesophageal reflux disease)   Hyperlipidemia   Type 2 diabetes mellitus (HCC)   Benign prostatic hyperplasia   Low back pain    #) thoracic/lumbar wound infection of midline incisional surgical site: Following lumbar spinal stenosis status post recent open T10 -pelvis posterior spinal fusion and L1-L2 posterior spinal decompression performed on 09/14/2020, patient presents with approximately 2 weeks of progressive incisional surgical site erythema, swelling, tenderness, with more recent development of purulent drainage.   Patient's case was discussed with Dr. Izora Ribas of neurosurgery, who will formally consult. He anticipates taking patient to OR later today (10/22/20 for washout procedure, and requested that patient be kept n.p.o. in anticipation of this. He  additionally, he requested that no antibiotics be given preoperatively unless the patient becomes septic.  Additionally, he requested that the following preoperative labs and imaging be obtained: CRP, ESR, urinalysis, CT of the thoracic/lumbar spine without contrast as well as MRI of the thoracic/lumbar spine with and without contrast.  Of note, the requested labs and imaging of been ordered, with  results currently pending.  No SIRS criteria are met at this time and therefore the patient does not meet criteria for sepsis.  He appears hemodynamically stable.  Blood cultures x2 were checked in the ED.   Patient confirms that he is on no blood thinners at home, including no aspirin. no evidence of acute MI or acutely decompensated heart failure.  Therefore, no absolute contraindication to proceeding with proposed surgical procedure. Lyndel Safe Score for this patient in the context of anticipated aforementioned washout procedure conveys a 0.09 % perioperative risk for significant cardiac event. Will obtain preoperative EKG.   Plan: Neurosurgery formally consulted, as above.  N.p.o. Will follow for results of ESR, CRP, urinalysis,  and CT of the thoracic/lumbar spine as well as MRI of the thoracic/lumbar spine, as further detailed above.  Preoperative EKG x1.  As needed IV Dilaudid.  As needed IV Zofran.  Gentle interval IV fluids in the form of lactated Ringer's at 100 cc/h x 10 hours.  Add on serum magnesium level.  Follow-up results of blood cultures x2.  Refraining from preoperative antibiotics, per request of neurosurgery.  Repeat CBC with differential in the morning.      #) Hyperlipidemia: On high intensity atorvastatin as an outpatient.  Plan: In the setting of current n.p.o. status, will hold home statin for now.      #) GERD: On omeprazole as an outpatient.  Plan: In the context of current n.p.o. status, hold home PPI for now.       #) Type 2 diabetes mellitus: Noninsulin-dependent.  He is on Januvia and Empagliflozin as an outpatient, with presenting blood sugar per presenting BMP noted to be 176.  Plan: Hold home Januvia and Empagliflozin during this hospitalization.  I have ordered Accu-Cheks every 6 hours with sliding scale insulin.      #) Benign prostatic hyperplasia: On Flomax as an outpatient.  In setting of current n.p.o. status, will hold home Flomax for now.   Monitor strict I's and O's and daily weights.  Repeat BMP in the morning.      DVT prophylaxis: scd's  Code Status: Full code Family Communication: The patient's case was discussed with his wife, who was present at bedside Disposition Plan: Per Rounding Team Consults called: Case was discussed with Dr. Izora Ribas of neurosurgery, as further detailed above Admission status: Inpatient; [place].     Of note, this patient was added by me to the following Admit List/Treatment Team: [facility].      PLEASE NOTE THAT DRAGON DICTATION SOFTWARE WAS USED IN THE CONSTRUCTION OF THIS NOTE.   [place] [facility] Pager [phone] From [place]  Otherwise, please contact night-coverage  [email or web address] Password [facility]   10/22/2020, 12:41 AM

## 2020-10-22 NOTE — Progress Notes (Signed)
PROGRESS NOTE    [name]  [id number] DOB: 05/20/1956 DOA: 10/21/2020 PCP: [facility]  [id number]/[id number]   Assessment & Plan:   Principal Problem:   Wound infection after surgery Active Problems:   GERD (gastroesophageal reflux disease)   Hyperlipidemia   Type 2 diabetes mellitus ([place])   Benign prostatic hyperplasia   Low back pain   Howard Murphy is a 65 y.o. male with medical history significant for lumbar spinal stenosis status post recent open T10 -pelvis posterior spinal fusion and L1-L2 posterior spinal decompression, hyperlipidemia, type 2 diabetes mellitus, who is admitted to [facility] on 10/21/2020 with concern for thoracic/lumbar wound infection of midline incisional surgical site after presenting from home to [facility] ED complaining of incisional surgical site erythema.   In the setting of lumbar spinal stenosis, the patient underwent open T10 -pelvis posterior spinal fusion and L1-L2 posterior spinal decompression on 09/14/2020 with Dr. Izora Ribas of neurosurgery.  The patient reports that this occurred approximately 2 years after preceding lumbar spine surgery, also performed by Dr. Izora Ribas.  Patient reports approximately 2 weeks of progressive erythema, tenderness, swelling associated with thoracic/lumbar midline incisional surgical site.  In the interval, the patient followed up in clinic with Dr. Izora Ribas on 10/13/2020, at which time he conveys that he was not experiencing any significant drainage from the site.  Conservative measures were elected, however, in the interval since this outpatient follow-up, the patient reports further progression of the incisional site erythema, swelling, tenderness as well as interval development of drainage, that has been intermittently purulent in nature.  The patient denies any new numbness or paresthesias involving the lower extremities, and denies any saddle anesthesia, new onset urinary retention  or fecal incontinence.  Denies any associated subjective fever, chills, rigors, or generalized myalgias.  The setting of the above, the patient contacted Dr Izora Ribas on 10/21/2020, with ensuing instructions to present to [facility] ED for further evaluation and management of suspected incisional surgical site infection.    # thoracic/lumbar wound infection of midline incisional surgical site: Following lumbar spinal stenosis status post recent open T10 -pelvis posterior spinal fusion and L1-L2 posterior spinal decompression performed on 09/14/2020, patient presents with approximately 2 weeks of progressive incisional surgical site erythema, swelling, tenderness, with more recent development of purulent drainage.  Plan: --OR I/D today with Dr. Izora Ribas --start vanc/cefepime after surgery --ID consult, per Dr. Izora Ribas --pain control  # DM2, non-insulin-dependent, well controlled --Hold home Januvia and Empagliflozin while inpatient --ACHS and SSI  # HLD --resume home statin  # GERD --resume home PPI  # BPH --resume home Flomax   DVT prophylaxis: Lovenox SQ Code Status: Full code  Family Communication: wife updated at bedside today Level of care: Med-Surg Dispo:   The patient is from: home Anticipated d/c is to: home Anticipated d/c date is: 2-3 days Patient currently is not medically ready to d/c due to: post-I/D, ID consult planned   Subjective and Interval History:  Pt went for OR I/D of posterior lumbar today.  Pt was seen afterwards, and complained of severe leg cramps.     Objective: Vitals:   10/22/20 1338 10/22/20 1353 10/22/20 1410 10/22/20 1749  BP: (!) 122/54  115/70 116/70  Pulse: 86 91 100 (!) 104  Resp: 17 19  16   Temp: 97.7 F (36.5 C)   98.1 F (36.7 C)  TempSrc:      SpO2: 95% 97% 95% 94%  Weight:      Height:  Intake/Output Summary (Last 24 hours) at 10/22/2020 1804 Last data filed at 10/22/2020 1800 Gross per 24 hour  Intake 1250.5 ml  Output  425 ml  Net 825.5 ml   Filed Weights   10/21/20 2117  Weight: 83.5 kg    Examination:   Constitutional: In mild distress due to severe leg cramps, AAOx3 HEENT: conjunctivae and lids normal, EOMI CV: No cyanosis.   RESP: normal respiratory effort, on RA Extremities: No effusions, edema in BLE SKIN: surgical dressing over mid back, with drains outputting serosanguinous fluids Neuro: II - XII grossly intact.   Psych: Normal mood and affect.  Appropriate judgement and reason   Data Reviewed: I have personally reviewed following labs and imaging studies  CBC: Recent Labs  Lab 10/21/20 2122 10/22/20 0455  WBC 8.6 9.3  NEUTROABS 4.5 5.3  HGB 13.9 13.9  HCT 43.1 42.3  MCV 88.3 87.2  PLT 215 [id number]   Basic Metabolic Panel: Recent Labs  Lab 10/21/20 2122 10/22/20 0455  NA 136 137  K 3.6 3.3*  CL 99 99  CO2 28 30  GLUCOSE 176* 127*  BUN 16 12  CREATININE 0.98 0.80  CALCIUM 8.9 8.8*  MG  --  1.6*   GFR: Estimated Creatinine Clearance: 95.2 mL/min (by C-G formula based on SCr of 0.8 mg/dL). Liver Function Tests: No results for input(s): AST, ALT, ALKPHOS, BILITOT, PROT, ALBUMIN in the last 168 hours. No results for input(s): LIPASE, AMYLASE in the last 168 hours. No results for input(s): AMMONIA in the last 168 hours. Coagulation Profile: No results for input(s): INR, PROTIME in the last 168 hours. Cardiac Enzymes: No results for input(s): CKTOTAL, CKMB, CKMBINDEX, TROPONINI in the last 168 hours. BNP (last 3 results) No results for input(s): PROBNP in the last 8760 hours. HbA1C: No results for input(s): HGBA1C in the last 72 hours. CBG: Recent Labs  Lab 10/22/20 0653 10/22/20 1257 10/22/20 1751  GLUCAP 129* 147* 245*   Lipid Profile: No results for input(s): CHOL, HDL, LDLCALC, TRIG, CHOLHDL, LDLDIRECT in the last 72 hours. Thyroid Function Tests: No results for input(s): TSH, T4TOTAL, FREET4, T3FREE, THYROIDAB in the last 72 hours. Anemia Panel: No  results for input(s): VITAMINB12, FOLATE, FERRITIN, TIBC, IRON, RETICCTPCT in the last 72 hours. Sepsis Labs: No results for input(s): PROCALCITON, LATICACIDVEN in the last 168 hours.  Recent Results (from the past 240 hour(s))  Blood culture (routine x 2)     Status: None (Preliminary result)   Collection Time: 10/22/20 12:20 AM   Specimen: BLOOD  Result Value Ref Range Status   Specimen Description BLOOD BLOOD RIGHT FOREARM  Final   Special Requests   Final    BOTTLES DRAWN AEROBIC AND ANAEROBIC Blood Culture results may not be optimal due to an inadequate volume of blood received in culture bottles   Culture   Final    NO GROWTH < 12 HOURS Performed at [facility], [address], [place], [place] [address]    Report Status PENDING  Incomplete  Blood culture (routine x 2)     Status: None (Preliminary result)   Collection Time: 10/22/20 12:20 AM   Specimen: BLOOD  Result Value Ref Range Status   Specimen Description BLOOD RIGHT ANTECUBITAL  Final   Special Requests   Final    BOTTLES DRAWN AEROBIC AND ANAEROBIC Blood Culture results may not be optimal due to an inadequate volume of blood received in culture bottles   Culture   Final    NO  GROWTH < 12 HOURS Performed at [facility], [address] [place], [place] [address]    Report Status PENDING  Incomplete  Resp Panel by RT-PCR (Flu A&B, Covid) Nasopharyngeal Swab     Status: None   Collection Time: 10/22/20 12:20 AM   Specimen: Nasopharyngeal Swab; Nasopharyngeal(NP) swabs in vial transport medium  Result Value Ref Range Status   SARS Coronavirus 2 by RT PCR NEGATIVE NEGATIVE Final    Comment: (NOTE) SARS-CoV-2 target nucleic acids are NOT DETECTED.  The SARS-CoV-2 RNA is generally detectable in upper respiratory specimens during the acute phase of infection. The lowest concentration of SARS-CoV-2 viral copies this assay can detect is 138 copies/mL. A negative result does not preclude  SARS-Cov-2 infection and should not be used as the sole basis for treatment or other patient management decisions. A negative result may occur with  improper specimen collection/handling, submission of specimen other than nasopharyngeal swab, presence of viral mutation(s) within the areas targeted by this assay, and inadequate number of viral copies(<138 copies/mL). A negative result must be combined with clinical observations, patient history, and epidemiological information. The expected result is Negative.  Fact Sheet for Patients:  EntrepreneurPulse.com.au  Fact Sheet for Healthcare Providers:  IncredibleEmployment.be  This test is no t yet approved or cleared by the [place] FDA and  has been authorized for detection and/or diagnosis of SARS-CoV-2 by FDA under an Emergency Use Authorization (EUA). This EUA will remain  in effect (meaning this test can be used) for the duration of the COVID-19 declaration under Section 564(b)(1) of the Act, 21 U.S.C.section 360bbb-3(b)(1), unless the authorization is terminated  or revoked sooner.       Influenza A by PCR NEGATIVE NEGATIVE Final   Influenza B by PCR NEGATIVE NEGATIVE Final    Comment: (NOTE) The Xpert Xpress SARS-CoV-2/FLU/RSV plus assay is intended as an aid in the diagnosis of influenza from Nasopharyngeal swab specimens and should not be used as a sole basis for treatment. Nasal washings and aspirates are unacceptable for Xpert Xpress SARS-CoV-2/FLU/RSV testing.  Fact Sheet for Patients: EntrepreneurPulse.com.au  Fact Sheet for Healthcare Providers: IncredibleEmployment.be  This test is not yet approved or cleared by the [place] FDA and has been authorized for detection and/or diagnosis of SARS-CoV-2 by FDA under an Emergency Use Authorization (EUA). This EUA will remain in effect (meaning this test can be used) for the duration of  the COVID-19 declaration under Section 564(b)(1) of the Act, 21 U.S.C. section 360bbb-3(b)(1), unless the authorization is terminated or revoked.  Performed at [facility], [address] [place],  [address]       Radiology Studies: CT Thoracic Spine Wo Contrast  Result Date: 10/22/2020 CLINICAL DATA:  Initial evaluation for low back pain, infection or inflammation suspected. EXAM: CT THORACIC AND LUMBAR SPINE WITHOUT CONTRAST TECHNIQUE: Multidetector CT imaging of the thoracic and lumbar spine was performed without contrast. Multiplanar CT image reconstructions were also generated. COMPARISON:  Prior study from 09/26/2020. FINDINGS: CT THORACIC SPINE FINDINGS Alignment: Trace retrolisthesis of T11 on T12, stable. Alignment otherwise normal. Vertebrae: No acute or interval fracture. No discrete or worrisome osseous lesions. Mild chronic anterior wedging of the T11 vertebral body, stable. Posterior fusion extending from T11 through the sacrum. Hardware remains well positioned without evidence for loosening or other complication. Paraspinal and other soft tissues: Mild postoperative stranding present within the posterior paraspinous soft tissues extending from T8-9 inferiorly. No discrete collections evident by CT. Remainder of the paraspinous soft  tissues otherwise unremarkable. Partially visualized lungs are clear. Disc levels: Anterior bridging osteophytes extending from T7 through L1. No new or progressive spinal stenosis. Left-sided facet arthrosis at T5-6-T8-9 with resultant moderate left foraminal stenosis at T5-6, with more mild narrowing at the remaining levels. Mild right foraminal narrowing at T6-7 due to facet arthrosis. Appearance is stable. CT LUMBAR SPINE FINDINGS Segmentation: Transitional lumbosacral anatomy with a lumbarized S1 segment. Same numbering system employed as on previous exam. Alignment: Mild retrolisthesis of L2 on L3, stable. Alignment otherwise normal.  Vertebrae: Posterior fusion extending from T11 through the sacrum. Hardware in stable position and is well aligned without loosening or other complication. Sequelae of prior screw removal on the right again noted at L5. Bilateral iliac screws remain in place traversing both SI joints. Interbody implants in place at L3-4, L4-5, and L5-S1, stable in position. Vertebral body height maintained without fracture. No discrete or worrisome osseous lesions. Paraspinal and other soft tissues: Postoperative changes within the posterior paraspinous soft tissues with expected postoperative stranding. No visible collections evident by CT. Aortic atherosclerosis noted. Disc levels: L1-2: Posterior fusion. Residual disc bulge with facet hypertrophy without significant stenosis. L2-3: Advanced degenerative intervertebral disc space narrowing with disc bulge, disc desiccation, and reactive endplate change, worse on the right. Associated degenerative sclerosis. Prior right laminectomy. Residual bilateral facet arthrosis. No spinal stenosis. Mild bilateral neural foraminal stenosis due to spurring, stable. L3-4 through S1-2. Previous fusion with similar appearance to previous exam. No significant residual stenosis. IMPRESSION: 1. Postoperative changes from prior posterior fusion from T11 through the sacrum, stable from previous without complication. Similar postoperative changes within the posterior paraspinous soft tissues without visible collection by CT. 2. 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 3. Aortic Atherosclerosis (ICD10-I70.0). Electronically Signed   By: Jeannine Boga M.D.   On: 10/22/2020 01:09   CT Lumbar Spine Wo Contrast  Result Date: 10/22/2020 CLINICAL DATA:  Initial evaluation for low back pain, infection or inflammation suspected. EXAM: CT THORACIC AND LUMBAR SPINE WITHOUT CONTRAST TECHNIQUE: Multidetector CT imaging of the thoracic and lumbar spine  was performed without contrast. Multiplanar CT image reconstructions were also generated. COMPARISON:  Prior study from 09/26/2020. FINDINGS: CT THORACIC SPINE FINDINGS Alignment: Trace retrolisthesis of T11 on T12, stable. Alignment otherwise normal. Vertebrae: No acute or interval fracture. No discrete or worrisome osseous lesions. Mild chronic anterior wedging of the T11 vertebral body, stable. Posterior fusion extending from T11 through the sacrum. Hardware remains well positioned without evidence for loosening or other complication. Paraspinal and other soft tissues: Mild postoperative stranding present within the posterior paraspinous soft tissues extending from T8-9 inferiorly. No discrete collections evident by CT. Remainder of the paraspinous soft tissues otherwise unremarkable. Partially visualized lungs are clear. Disc levels: Anterior bridging osteophytes extending from T7 through L1. No new or progressive spinal stenosis. Left-sided facet arthrosis at T5-6-T8-9 with resultant moderate left foraminal stenosis at T5-6, with more mild narrowing at the remaining levels. Mild right foraminal narrowing at T6-7 due to facet arthrosis. Appearance is stable. CT LUMBAR SPINE FINDINGS Segmentation: Transitional lumbosacral anatomy with a lumbarized S1 segment. Same numbering system employed as on previous exam. Alignment: Mild retrolisthesis of L2 on L3, stable. Alignment otherwise normal. Vertebrae: Posterior fusion extending from T11 through the sacrum. Hardware in stable position and is well aligned without loosening or other complication. Sequelae of prior screw removal on the right again noted at L5. Bilateral iliac screws remain in place traversing both SI joints.  Interbody implants in place at L3-4, L4-5, and L5-S1, stable in position. Vertebral body height maintained without fracture. No discrete or worrisome osseous lesions. Paraspinal and other soft tissues: Postoperative changes within the posterior  paraspinous soft tissues with expected postoperative stranding. No visible collections evident by CT. Aortic atherosclerosis noted. Disc levels: L1-2: Posterior fusion. Residual disc bulge with facet hypertrophy without significant stenosis. L2-3: Advanced degenerative intervertebral disc space narrowing with disc bulge, disc desiccation, and reactive endplate change, worse on the right. Associated degenerative sclerosis. Prior right laminectomy. Residual bilateral facet arthrosis. No spinal stenosis. Mild bilateral neural foraminal stenosis due to spurring, stable. L3-4 through S1-2. Previous fusion with similar appearance to previous exam. No significant residual stenosis. IMPRESSION: 1. Postoperative changes from prior posterior fusion from T11 through the sacrum, stable from previous without complication. Similar postoperative changes within the posterior paraspinous soft tissues without visible collection by CT. 2. 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 3. Aortic Atherosclerosis (ICD10-I70.0). Electronically Signed   By: Jeannine Boga M.D.   On: 10/22/2020 01:09   MR THORACIC SPINE W WO CONTRAST  Result Date: 10/22/2020 CLINICAL DATA:  Initial evaluation for acute back pain, concern for midline wound infection, redness and drainage. EXAM: MRI THORACIC AND LUMBAR SPINE WITHOUT AND WITH CONTRAST TECHNIQUE: Multiplanar and multiecho pulse sequences of the thoracic and lumbar spine were obtained without and with intravenous contrast. CONTRAST:  7.10mL GADAVIST GADOBUTROL 1 MMOL/ML IV SOLN COMPARISON:  Prior CT from earlier the same day as well as previous studies. FINDINGS: MRI THORACIC SPINE FINDINGS Alignment: Trace retrolisthesis of T11 on T12. Underlying trace dextroscoliosis. Vertebrae: Mild chronic wedging of T11 again noted, stable. Vertebral body height otherwise maintained without acute or subacute fracture. Susceptibility artifact related  to prior posterior fusion extending from T11 through the sacrum. Hardware better evaluated on prior CT. Underlying bone marrow signal intensity within normal limits. No worrisome osseous lesions. No findings to suggest osteomyelitis discitis or septic arthritis. Cord: Evaluation of the distal cord limited by adjacent susceptibility effect. Visualized portions are normal without signal abnormality. No epidural abscess or other collection. Paraspinal and other soft tissues: Postoperative changes from recent fusion present within the lower posterior paraspinous soft tissues. Small collection measuring 5 x 8 x 21 mm seen along the midline incision at the level of T11 (series 22, image 34). No significant subfascial extension. Finding favored to reflect a small postoperative seroma. Superimposed infection not excluded, and could be considered in the correct clinical setting. No other loculated collections. Visualized lungs are grossly clear. Visualized visceral structures unremarkable. Disc levels: C7-T1: Small central disc protrusion indents the ventral thecal sac without significant spinal stenosis (series 22, image 2). No other significant disc pathology seen within the thoracic spine. No spinal stenosis. She left-sided facet arthrosis at T5-6 through T8-9 with resultant moderate left foraminal stenosis at T5-6, with more mild narrowing at the remaining levels again noted. Mild right foraminal narrowing at T6-7 due to facet arthropathy. MRI LUMBAR SPINE FINDINGS Segmentation: Transitional lumbosacral anatomy with a lumbarized S1 segment. Vertebral body count is made from the dens, with same numbering system utilized as on previous exams. Alignment: 3-4 mm retrolisthesis of L2 on L3, stable. Alignment otherwise normal with preservation of the normal lumbar lordosis. Vertebrae: Extensive susceptibility artifact related to prior fusion at T11 through the sacrum. Interbody devices in place at L3-4, L4-5, and L5-S1.  Hardware better evaluated on prior CT. Vertebral body height maintained without acute fracture. Underlying bone  marrow signal intensity within normal limits. No worrisome osseous lesions. Heterogeneous signal abnormality about the L2-3 interspace felt to be Howard Murphy consistent with reactive discogenic change. No other signal abnormality. No findings to suggest osteomyelitis discitis or septic arthritis. Conus medullaris: Extends to the L1-2 level and appears normal. No epidural collections. Paraspinal and other soft tissues: Extensive postoperative changes present throughout the lower posterior paraspinous musculature and soft tissues. Small collection at the right laminectomy site at L2-3 measures 2.5 x 2.4 cm (series 28, image 5). No significant regional mass effect or surrounding edema. Finding favored to reflect a benign postoperative seroma. No other discrete or loculated collections. Moderate distension of the partially visualized urinary bladder noted. Retroaortic left renal vein noted. Disc levels: L1-2: Seen only on sagittal projection. Minimal annular disc bulge. Prior posterior fusion. No stenosis. L2-3: Retrolisthesis. Advanced degenerative intervertebral disc space narrowing with disc desiccation, diffuse disc bulge, and reactive endplate change, worse on the right. Prior posterior fusion with right laminectomy. No significant residual spinal stenosis. Foramina appear patent. L3-4: Prior posterior and interbody fusion. Bilateral facet hypertrophy. No significant residual spinal stenosis. Foramina appear patent. L4-5: Prior posterior and interbody fusion with posterior decompression. No residual spinal stenosis. Foramina appear patent. L5-S1: Prior posterior and interbody fusion with posterior decompression. No residual canal or foraminal stenosis. S1-2: Negative interspace. Prior posterior fusion. No residual spinal stenosis. Foramina appear patent. IMPRESSION: 1. Postoperative changes from prior fusion at  T11 through the sacrum. Extensive postoperative changes throughout the posterior paraspinous soft tissues with superimposed small 5 x 8 x 21 mm collection along the midline incision at the level of T11. Finding favored to reflect a small postoperative seroma, although superimposed infection not excluded, and could be considered in the correct clinical setting. 2. Additional 2.5 x 2.4 cm collection along the right laminectomy site at L2-3, also favored to reflect a postoperative seroma. No significant mass effect. 3. No other evidence for acute infection within the thoracic or lumbar spine. 4. Extensive postoperative changes from prior fusion at T11 through the sacrum. No residual or recurrent spinal stenosis. Electronically Signed   By: Jeannine Boga M.D.   On: 10/22/2020 03:16   MR Lumbar Spine W Wo Contrast  Result Date: 10/22/2020 CLINICAL DATA:  Initial evaluation for acute back pain, concern for midline wound infection, redness and drainage. EXAM: MRI THORACIC AND LUMBAR SPINE WITHOUT AND WITH CONTRAST TECHNIQUE: Multiplanar and multiecho pulse sequences of the thoracic and lumbar spine were obtained without and with intravenous contrast. CONTRAST:  7.28mL GADAVIST GADOBUTROL 1 MMOL/ML IV SOLN COMPARISON:  Prior CT from earlier the same day as well as previous studies. FINDINGS: MRI THORACIC SPINE FINDINGS Alignment: Trace retrolisthesis of T11 on T12. Underlying trace dextroscoliosis. Vertebrae: Mild chronic wedging of T11 again noted, stable. Vertebral body height otherwise maintained without acute or subacute fracture. Susceptibility artifact related to prior posterior fusion extending from T11 through the sacrum. Hardware better evaluated on prior CT. Underlying bone marrow signal intensity within normal limits. No worrisome osseous lesions. No findings to suggest osteomyelitis discitis or septic arthritis. Cord: Evaluation of the distal cord limited by adjacent susceptibility effect. Visualized  portions are normal without signal abnormality. No epidural abscess or other collection. Paraspinal and other soft tissues: Postoperative changes from recent fusion present within the lower posterior paraspinous soft tissues. Small collection measuring 5 x 8 x 21 mm seen along the midline incision at the level of T11 (series 22, image 34). No significant subfascial extension. Finding favored to reflect  a small postoperative seroma. Superimposed infection not excluded, and could be considered in the correct clinical setting. No other loculated collections. Visualized lungs are grossly clear. Visualized visceral structures unremarkable. Disc levels: C7-T1: Small central disc protrusion indents the ventral thecal sac without significant spinal stenosis (series 22, image 2). No other significant disc pathology seen within the thoracic spine. No spinal stenosis. She left-sided facet arthrosis at T5-6 through T8-9 with resultant moderate left foraminal stenosis at T5-6, with more mild narrowing at the remaining levels again noted. Mild right foraminal narrowing at T6-7 due to facet arthropathy. MRI LUMBAR SPINE FINDINGS Segmentation: Transitional lumbosacral anatomy with a lumbarized S1 segment. Vertebral body count is made from the dens, with same numbering system utilized as on previous exams. Alignment: 3-4 mm retrolisthesis of L2 on L3, stable. Alignment otherwise normal with preservation of the normal lumbar lordosis. Vertebrae: Extensive susceptibility artifact related to prior fusion at T11 through the sacrum. Interbody devices in place at L3-4, L4-5, and L5-S1. Hardware better evaluated on prior CT. Vertebral body height maintained without acute fracture. Underlying bone marrow signal intensity within normal limits. No worrisome osseous lesions. Heterogeneous signal abnormality about the L2-3 interspace felt to be Howard Murphy consistent with reactive discogenic change. No other signal abnormality. No findings to suggest  osteomyelitis discitis or septic arthritis. Conus medullaris: Extends to the L1-2 level and appears normal. No epidural collections. Paraspinal and other soft tissues: Extensive postoperative changes present throughout the lower posterior paraspinous musculature and soft tissues. Small collection at the right laminectomy site at L2-3 measures 2.5 x 2.4 cm (series 28, image 5). No significant regional mass effect or surrounding edema. Finding favored to reflect a benign postoperative seroma. No other discrete or loculated collections. Moderate distension of the partially visualized urinary bladder noted. Retroaortic left renal vein noted. Disc levels: L1-2: Seen only on sagittal projection. Minimal annular disc bulge. Prior posterior fusion. No stenosis. L2-3: Retrolisthesis. Advanced degenerative intervertebral disc space narrowing with disc desiccation, diffuse disc bulge, and reactive endplate change, worse on the right. Prior posterior fusion with right laminectomy. No significant residual spinal stenosis. Foramina appear patent. L3-4: Prior posterior and interbody fusion. Bilateral facet hypertrophy. No significant residual spinal stenosis. Foramina appear patent. L4-5: Prior posterior and interbody fusion with posterior decompression. No residual spinal stenosis. Foramina appear patent. L5-S1: Prior posterior and interbody fusion with posterior decompression. No residual canal or foraminal stenosis. S1-2: Negative interspace. Prior posterior fusion. No residual spinal stenosis. Foramina appear patent. IMPRESSION: 1. Postoperative changes from prior fusion at T11 through the sacrum. Extensive postoperative changes throughout the posterior paraspinous soft tissues with superimposed small 5 x 8 x 21 mm collection along the midline incision at the level of T11. Finding favored to reflect a small postoperative seroma, although superimposed infection not excluded, and could be considered in the correct clinical  setting. 2. Additional 2.5 x 2.4 cm collection along the right laminectomy site at L2-3, also favored to reflect a postoperative seroma. No significant mass effect. 3. No other evidence for acute infection within the thoracic or lumbar spine. 4. Extensive postoperative changes from prior fusion at T11 through the sacrum. No residual or recurrent spinal stenosis. Electronically Signed   By: Jeannine Boga M.D.   On: 10/22/2020 03:16     Scheduled Meds: . celecoxib  200 mg Oral BID  . insulin aspart  0-9 Units Subcutaneous Q6H   Continuous Infusions: . ceFEPime (MAXIPIME) IV 2 g (10/22/20 1741)  . potassium chloride    . [START  ON 10/23/2020] vancomycin    . vancomycin       LOS: 0 days   No Charge note.   Enzo Bi, MD [facility] If 7PM-7AM, please contact night-coverage 10/22/2020, 6:04 PM

## 2020-10-22 NOTE — ED Notes (Signed)
Pt transported to inpatient bed via ED stretcher by this RN. Ambulated into room with steady gait and independently. AOx4, talking in full sentences with regular and unlabored breathing. Inpatient tech made aware of pt arrival to room.

## 2020-10-22 NOTE — Transfer of Care (Signed)
Immediate Anesthesia Transfer of Care Note  Patient: Howard Murphy  Procedure(s) Performed: INCISION AND DRAINAGE of POSTERIOR LUMBAR (N/A Back)  Patient Location: PACU  Anesthesia Type:General  Level of Consciousness: drowsy and patient cooperative  Airway & Oxygen Therapy: Patient Spontanous Breathing and Patient connected to nasal cannula oxygen  Post-op Assessment: Report given to RN and Post -op Vital signs reviewed and stable  Post vital signs: Reviewed and stable  Last Vitals:  Vitals Value Taken Time  BP 146/71 10/22/20 1253  Temp    Pulse 81 10/22/20 1259  Resp 12 10/22/20 1259  SpO2 99 % 10/22/20 1259  Vitals shown include unvalidated device data.  Last Pain:  Vitals:   10/22/20 0854  TempSrc:   PainSc: 7          Complications: No complications documented.

## 2020-10-22 NOTE — ED Provider Notes (Signed)
[facility] Emergency Department Provider Note  ____________________________________________  Time seen: Approximately 12:14 AM  I have reviewed the triage vital signs and the nursing notes.   HISTORY  Chief Complaint infection   HPI Howard Murphy is a 65 y.o. male status post lumbar spinal fusion by Dr. Izora Ribas on 10/13/2020 who presents for concerns of postop infection.  Patient noticed worsening pain over the last couple of days on his mid back.  He felt like he had a fever last night.  Has had chills most of the day today.  His wife noticed some pus draining from the surgical site today.  His pain is constant, moderate, severe with ambulation, located at the surgical site.  He denies any saddle anesthesia, numbness or weakness of his lower extremities, urinary or bowel incontinence or retention.   Past Medical History:  Diagnosis Date  . 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  . Back ache 05/05/2013  . Diabetes mellitus without complication ([place])   . GERD (gastroesophageal reflux disease)   . Headache    MIGRAINES  . History of kidney stones   . Hyperlipemia   . Hypertension   . Kidney stones   . Kidney stones   . Nephrolithiasis   . Shortness of breath dyspnea     Patient Active Problem List   Diagnosis Date Noted  . Wound infection after surgery 10/22/2020  . Low back pain 10/22/2020  . Post-operative state   . Syncope 09/24/2020  . Lumbar adjacent segment disease with spondylolisthesis 09/14/2020  . Uncomplicated opioid dependence ([place]) 02/20/2020  . DDD (degenerative disc disease), lumbar 02/03/2020  . Grade 1 Retrolisthesis of L1/L2 01/05/2020  . Lumbar disc extrusion with 7 mm caudal migration (L1-2) 01/05/2020  . Lumbosacral radiculopathy at L1 (Right) 01/05/2020  . Groin pain (Right) 01/05/2020  . Neural foraminal stenosis of lumbar spine (L1-2) (Right) 01/05/2020  . DDD (degenerative disc disease), lumbosacral 01/05/2020  . Acute  exacerbation of chronic low back pain 12/30/2019  . Pharmacologic therapy 11/25/2019  . Disorder of skeletal system 11/25/2019  . Problems influencing health status 11/25/2019  . Osteoarthritis involving multiple joints 02/23/2019  . Chronic musculoskeletal pain 02/23/2019  . Insomnia secondary to chronic pain 02/23/2019  . Neurogenic claudication ([place]) 04/02/2018  . Chronic bilateral low back pain with bilateral sciatica 03/05/2018  . Spondylosis without myelopathy or radiculopathy, lumbosacral region 01/14/2018  . Other specified dorsopathies, sacral and sacrococcygeal region 12/26/2017  . Acute appendicitis 12/18/2017  . Medication monitoring encounter 03/07/2017  . 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 Spondylolisthesis at L3-L4 level 08/03/2016  . Chronic pain syndrome 07/09/2016  . Encounter for medication monitoring 02/24/2016  . Cervical spondylosis 12/17/2015  . Cervical facet arthropathy 12/17/2015  . Chronic neck pain (Bilateral) (R>L) 12/17/2015  . Cervical facet syndrome (Bilateral) (R>L) 12/17/2015  . Chronic upper extremity pain (Bilateral) (R>L) 12/17/2015  . Cervical foraminal stenosis 12/17/2015  .  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 Arthropathy of shoulder (Right) 12/17/2015  . 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 Lumbar foraminal stenosis  (Left: L4-5) (Right: L1-2) 12/17/2015  . Grade 1 Anterolisthesis (2 mm) of L4 over L5. 12/17/2015  . Lumbar facet (L4-5) synovial cyst (6 mm) (Left) 12/17/2015  . Chronic sacroiliac joint pain (Bilateral) (L>R) 12/08/2015  . Neurogenic pain 12/08/2015  . Abnormal MRI, lumbar spine (12/23/2019) 11/21/2015  . Radicular pain of shoulder (  Right) 10/03/2015  . Carpal tunnel syndrome (Bilateral) 10/03/2015  . Chronic cervical radicular pain (Right) 10/03/2015  . Chronic low back pain (1ry area of Pain)  (Bilateral) (L>R) w/o sciatica 09/06/2015  . Opiate use (60 MME/Day) 09/05/2015  . Long term prescription opiate use 09/05/2015  . Long term current use of opiate analgesic 09/05/2015  . Spondylosis of lumbar spine 09/05/2015  . Lumbar facet joint syndrome ([place]) 09/05/2015  . Chronic hip pain (Left) 09/05/2015  . Diabetic peripheral neuropathy ([place]) 09/05/2015  . Chronic lumbar radicular pain (S1 Dermatome) (Bilateral) (L>R) 09/05/2015  . Hypokalemia 09/05/2015  . Chronic shoulder pain (2ry area of Pain) (Bilateral) (L>R) 09/05/2015  . Diabetes ([place]) 09/05/2015  . Chronic foot pain (3ry area of Pain) (Bilateral) (L>R) 09/05/2015  . Chronic hand pain (4th area of Pain) (Bilateral) (R>L) 09/05/2015  . Lumbar facet hypertrophy 09/05/2015  . Encounter for therapeutic drug level monitoring 09/05/2015  . Low testosterone 09/05/2015  . Occipital pain (Right) 09/05/2015  . 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 Hypertriglyceridemia 07/26/2015  . Chronic abdominal pain (RUQ) 07/14/2015  . Type 2 diabetes mellitus ([place]) 07/14/2015  . Chest pain 07/14/2015  . Complete tear of the shoulder rotator cuff (Left) 05/10/2015  . Complete tear of left rotator cuff 05/10/2015  . SOB (shortness of breath) 03/22/2015  . Elevated hemoglobin ([place]) 07/07/2013  . History of lumbar facet Synovial cyst, surgically removed. 05/29/2013    Class: History of  . 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 GERD (gastroesophageal reflux disease) 05/05/2013  . Benign prostatic hyperplasia 05/05/2013  . Essential hypertension 05/08/2011  . Hyperlipidemia 05/08/2011    Past Surgical History:  Procedure Laterality Date  . APPENDECTOMY    . BACK SURGERY  2015   x3  . BACK SURGERY  08/09/2016  . BACK SURGERY Bilateral 2019  . CHOLECYSTECTOMY  2000  . COLONOSCOPY WITH PROPOFOL N/A 04/11/2015   Procedure: COLONOSCOPY WITH PROPOFOL;  Surgeon: Manya Silvas, MD;  Location: [facility] ENDOSCOPY;  Service:  Endoscopy;  Laterality: N/A;  . CYSTOSCOPY WITH URETEROSCOPY AND STENT PLACEMENT Right 08/27/2017   Procedure: CYSTOSCOPY WITH URETEROSCOPY AND STENT PLACEMENT;  Surgeon: Abbie Sons, MD;  Location: [facility] ORS;  Service: Urology;  Laterality: Right;  . ESOPHAGOGASTRODUODENOSCOPY (EGD) WITH PROPOFOL N/A 04/11/2015   Procedure: ESOPHAGOGASTRODUODENOSCOPY (EGD) WITH PROPOFOL;  Surgeon: Manya Silvas, MD;  Location: [facility] ENDOSCOPY;  Service: Endoscopy;  Laterality: N/A;  . LAPAROSCOPIC APPENDECTOMY N/A 12/18/2017   Procedure: APPENDECTOMY LAPAROSCOPIC;  Surgeon: Florene Glen, MD;  Location: [facility] ORS;  Service: General;  Laterality: N/A;  . POSTERIOR LUMBAR FUSION 4 LEVEL N/A 09/14/2020   Procedure: OPEN T10-PELVIS POSTERIOR SPINAL FUSION (HARDWARE ALREADY PRESENT AT L2-S2); L1-2 POSTERIOR SPINAL DECOMPRESSION;  Surgeon: Meade Maw, MD;  Location: [facility] ORS;  Service: Neurosurgery;  Laterality: N/A;  . SHOULDER SURGERY Right 2016   X2  . SHOULDER SURGERY Left 06/2015    Prior to Admission medications   Medication Sig Start Date End Date Taking? Authorizing Provider  acetaminophen (TYLENOL) 500 MG tablet Take 2 tablets (1,000 mg total) by mouth every 6 (six) hours. 09/17/20   Meade Maw, MD  atorvastatin (LIPITOR) 40 MG tablet Take 1 tablet (40 mg total) by mouth at bedtime. 03/08/17   Lada, Satira Anis, MD  HYDROmorphone (DILAUDID) 4 MG tablet Take 1-2 tablets (4-8 mg total) by mouth every 6 (six) hours as needed for severe pain. 09/27/20   Domenic Polite, MD  JANUVIA 100 MG tablet Take  100 mg by mouth daily. 05/26/20   [provider]  JARDIANCE 25 MG TABS tablet Take 25 mg by mouth daily. 02/06/20   [provider]  lidocaine (LIDODERM) 5 % Place 1 patch onto the skin daily. Remove & Discard patch within 12 hours or as directed by MD 09/28/20   Domenic Polite, MD  meclizine (ANTIVERT) 25 MG tablet Take 25 mg by mouth every 6 (six) hours as needed for dizziness.     [provider]  omeprazole (PRILOSEC OTC) 20 MG tablet Take 1 tablet (20 mg total) by mouth daily for 20 days. 09/27/20 10/17/20  Domenic Polite, MD  oxyCODONE-acetaminophen (PERCOCET) 10-325 MG tablet Take 1 tablet by mouth every 6 (six) hours as needed for pain (Restart this only after you are out of Dilaudid). Restart this only after you are out of Dilaudid 10/12/20 11/11/20  Domenic Polite, MD  senna-docusate (SENOKOT-S) 8.6-50 MG tablet Take 1 tablet by mouth 2 (two) times daily. 09/27/20   Domenic Polite, MD  tamsulosin (FLOMAX) 0.4 MG CAPS capsule Take 1 capsule (0.4 mg total) by mouth daily. 11/04/18   Billey Co, MD  testosterone cypionate (DEPOTESTOSTERONE CYPIONATE) 200 MG/ML injection Inject 200 mg into the muscle every 7 (seven) days. 10/07/18   [provider]  UNABLE TO FIND Outpatient physical therapy 09/27/20   Domenic Polite, MD  zolpidem (AMBIEN) 10 MG tablet Take 10 mg by mouth at bedtime.    [provider]    Allergies Adhesive [tape]  Family History  Problem Relation Age of Onset  . Cancer Mother        Breast CA  . Stroke Father   . Heart disease Father   . Cancer Sister   . Cancer Maternal Grandmother   . Cancer Sister   . Bladder Cancer Neg Hx   . Prostate cancer Neg Hx   . Kidney cancer Neg Hx     Social History Social History   Tobacco Use  . Smoking status: Never Smoker  . Smokeless tobacco: Former Systems developer    Types: Secondary school teacher  . Vaping Use: Never used  Substance Use Topics  . Alcohol use: No  . Drug use: No    Review of Systems  Constitutional: Negative for fever. + chills Eyes: Negative for visual changes. ENT: Negative for sore throat. Neck: No neck pain  Cardiovascular: Negative for chest pain. Respiratory: Negative for shortness of breath. Gastrointestinal: Negative for abdominal pain, vomiting or diarrhea. Genitourinary: Negative for dysuria. Musculoskeletal: + back pain. Skin: Negative for  rash. Neurological: Negative for headaches, weakness or numbness. Psych: No SI or HI  ____________________________________________   PHYSICAL EXAM:  VITAL SIGNS: ED Triage Vitals [10/21/20 2117]  Enc Vitals Group     BP 124/75     Pulse Rate 80     Resp 20     Temp 97.8 F (36.6 C)     Temp Source Oral     SpO2      Weight 184 lb (83.5 kg)     Height 5\' 7"  (1.702 m)     Head Circumference      Peak Flow      Pain Score 7     Pain Loc      Pain Edu?      Excl. in [place]?     Constitutional: Alert and oriented. Well appearing and in no apparent distress. HEENT:      Head: Normocephalic and atraumatic.  Eyes: Conjunctivae are normal. Sclera is non-icteric.       Mouth/Throat: Mucous membranes are moist.       Neck: Supple with no signs of meningismus. Cardiovascular: Regular rate and rhythm. No murmurs, gallops, or rubs. 2+ symmetrical distal pulses are present in all extremities. No JVD. Respiratory: Normal respiratory effort. Lungs are clear to auscultation bilaterally.  Gastrointestinal: Soft, non tender. Musculoskeletal: Surgical scar over the lower thoracic upper lumbar area with surrounding erythema and warmth, there is a small dehisced area on the bottom part of the incision which is where his wife saw pus earlier today.  There is no pus at this time.  Patient is tender throughout the region on palpation, no fluctuance palpable. Neurologic: Normal speech and language. Face is symmetric. Moving all extremities. Normal strength and sensation of bilateral lower extremity with normal DTRs bilaterally Skin: Skin is warm, dry and intact. No rash noted. Psychiatric: Mood and affect are normal. Speech and behavior are normal.  ____________________________________________   LABS (all labs ordered are listed, but only abnormal results are displayed)  Labs Reviewed  BASIC METABOLIC PANEL - Abnormal; Notable for the following components:      Result Value   Glucose, Bld  176 (*)    All other components within normal limits  C-REACTIVE PROTEIN - Abnormal; Notable for the following components:   CRP 2.4 (*)    All other components within normal limits  BASIC METABOLIC PANEL - Abnormal; Notable for the following components:   Potassium 3.3 (*)    Glucose, Bld 127 (*)    Calcium 8.8 (*)    All other components within normal limits  MAGNESIUM - Abnormal; Notable for the following components:   Magnesium 1.6 (*)    All other components within normal limits  RESP PANEL BY RT-PCR (FLU A&B, COVID) ARPGX2  CULTURE, BLOOD (ROUTINE X 2)  CULTURE, BLOOD (ROUTINE X 2)  CBC WITH DIFFERENTIAL/PLATELET  SEDIMENTATION RATE  CBC WITH DIFFERENTIAL/PLATELET  URINALYSIS, COMPLETE (UACMP) WITH MICROSCOPIC   ____________________________________________  EKG  none  ____________________________________________  RADIOLOGY  I have personally reviewed the images performed during this visit and I agree with the Radiologist's read.   Interpretation by Radiologist:  CT Thoracic Spine Wo Contrast  Result Date: 10/22/2020 CLINICAL DATA:  Initial evaluation for low back pain, infection or inflammation suspected. EXAM: CT THORACIC AND LUMBAR SPINE WITHOUT CONTRAST TECHNIQUE: Multidetector CT imaging of the thoracic and lumbar spine was performed without contrast. Multiplanar CT image reconstructions were also generated. COMPARISON:  Prior study from 09/26/2020. FINDINGS: CT THORACIC SPINE FINDINGS Alignment: Trace retrolisthesis of T11 on T12, stable. Alignment otherwise normal. Vertebrae: No acute or interval fracture. No discrete or worrisome osseous lesions. Mild chronic anterior wedging of the T11 vertebral body, stable. Posterior fusion extending from T11 through the sacrum. Hardware remains well positioned without evidence for loosening or other complication. Paraspinal and other soft tissues: Mild postoperative stranding present within the posterior paraspinous soft tissues  extending from T8-9 inferiorly. No discrete collections evident by CT. Remainder of the paraspinous soft tissues otherwise unremarkable. Partially visualized lungs are clear. Disc levels: Anterior bridging osteophytes extending from T7 through L1. No new or progressive spinal stenosis. Left-sided facet arthrosis at T5-6-T8-9 with resultant moderate left foraminal stenosis at T5-6, with more mild narrowing at the remaining levels. Mild right foraminal narrowing at T6-7 due to facet arthrosis. Appearance is stable. CT LUMBAR SPINE FINDINGS Segmentation: Transitional lumbosacral anatomy with a lumbarized S1 segment. Same numbering system employed as  on previous exam. Alignment: Mild retrolisthesis of L2 on L3, stable. Alignment otherwise normal. Vertebrae: Posterior fusion extending from T11 through the sacrum. Hardware in stable position and is well aligned without loosening or other complication. Sequelae of prior screw removal on the right again noted at L5. Bilateral iliac screws remain in place traversing both SI joints. Interbody implants in place at L3-4, L4-5, and L5-S1, stable in position. Vertebral body height maintained without fracture. No discrete or worrisome osseous lesions. Paraspinal and other soft tissues: Postoperative changes within the posterior paraspinous soft tissues with expected postoperative stranding. No visible collections evident by CT. Aortic atherosclerosis noted. Disc levels: L1-2: Posterior fusion. Residual disc bulge with facet hypertrophy without significant stenosis. L2-3: Advanced degenerative intervertebral disc space narrowing with disc bulge, disc desiccation, and reactive endplate change, worse on the right. Associated degenerative sclerosis. Prior right laminectomy. Residual bilateral facet arthrosis. No spinal stenosis. Mild bilateral neural foraminal stenosis due to spurring, stable. L3-4 through S1-2. Previous fusion with similar appearance to previous exam. No significant  residual stenosis. IMPRESSION: 1. Postoperative changes from prior posterior fusion from T11 through the sacrum, stable from previous without complication. Similar postoperative changes within the posterior paraspinous soft tissues without visible collection by CT. 2. 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 3. Aortic Atherosclerosis (ICD10-I70.0). Electronically Signed   By: Jeannine Boga M.D.   On: 10/22/2020 01:09   CT Lumbar Spine Wo Contrast  Result Date: 10/22/2020 CLINICAL DATA:  Initial evaluation for low back pain, infection or inflammation suspected. EXAM: CT THORACIC AND LUMBAR SPINE WITHOUT CONTRAST TECHNIQUE: Multidetector CT imaging of the thoracic and lumbar spine was performed without contrast. Multiplanar CT image reconstructions were also generated. COMPARISON:  Prior study from 09/26/2020. FINDINGS: CT THORACIC SPINE FINDINGS Alignment: Trace retrolisthesis of T11 on T12, stable. Alignment otherwise normal. Vertebrae: No acute or interval fracture. No discrete or worrisome osseous lesions. Mild chronic anterior wedging of the T11 vertebral body, stable. Posterior fusion extending from T11 through the sacrum. Hardware remains well positioned without evidence for loosening or other complication. Paraspinal and other soft tissues: Mild postoperative stranding present within the posterior paraspinous soft tissues extending from T8-9 inferiorly. No discrete collections evident by CT. Remainder of the paraspinous soft tissues otherwise unremarkable. Partially visualized lungs are clear. Disc levels: Anterior bridging osteophytes extending from T7 through L1. No new or progressive spinal stenosis. Left-sided facet arthrosis at T5-6-T8-9 with resultant moderate left foraminal stenosis at T5-6, with more mild narrowing at the remaining levels. Mild right foraminal narrowing at T6-7 due to facet arthrosis. Appearance is stable. CT LUMBAR  SPINE FINDINGS Segmentation: Transitional lumbosacral anatomy with a lumbarized S1 segment. Same numbering system employed as on previous exam. Alignment: Mild retrolisthesis of L2 on L3, stable. Alignment otherwise normal. Vertebrae: Posterior fusion extending from T11 through the sacrum. Hardware in stable position and is well aligned without loosening or other complication. Sequelae of prior screw removal on the right again noted at L5. Bilateral iliac screws remain in place traversing both SI joints. Interbody implants in place at L3-4, L4-5, and L5-S1, stable in position. Vertebral body height maintained without fracture. No discrete or worrisome osseous lesions. Paraspinal and other soft tissues: Postoperative changes within the posterior paraspinous soft tissues with expected postoperative stranding. No visible collections evident by CT. Aortic atherosclerosis noted. Disc levels: L1-2: Posterior fusion. Residual disc bulge with facet hypertrophy without significant stenosis. L2-3: Advanced degenerative intervertebral disc space narrowing with disc bulge, disc desiccation,  and reactive endplate change, worse on the right. Associated degenerative sclerosis. Prior right laminectomy. Residual bilateral facet arthrosis. No spinal stenosis. Mild bilateral neural foraminal stenosis due to spurring, stable. L3-4 through S1-2. Previous fusion with similar appearance to previous exam. No significant residual stenosis. IMPRESSION: 1. Postoperative changes from prior posterior fusion from T11 through the sacrum, stable from previous without complication. Similar postoperative changes within the posterior paraspinous soft tissues without visible collection by CT. 2. 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 3. Aortic Atherosclerosis (ICD10-I70.0). Electronically Signed   By: Jeannine Boga M.D.   On: 10/22/2020 01:09   MR THORACIC SPINE W WO CONTRAST  Result  Date: 10/22/2020 CLINICAL DATA:  Initial evaluation for acute back pain, concern for midline wound infection, redness and drainage. EXAM: MRI THORACIC AND LUMBAR SPINE WITHOUT AND WITH CONTRAST TECHNIQUE: Multiplanar and multiecho pulse sequences of the thoracic and lumbar spine were obtained without and with intravenous contrast. CONTRAST:  7.15mL GADAVIST GADOBUTROL 1 MMOL/ML IV SOLN COMPARISON:  Prior CT from earlier the same day as well as previous studies. FINDINGS: MRI THORACIC SPINE FINDINGS Alignment: Trace retrolisthesis of T11 on T12. Underlying trace dextroscoliosis. Vertebrae: Mild chronic wedging of T11 again noted, stable. Vertebral body height otherwise maintained without acute or subacute fracture. Susceptibility artifact related to prior posterior fusion extending from T11 through the sacrum. Hardware better evaluated on prior CT. Underlying bone marrow signal intensity within normal limits. No worrisome osseous lesions. No findings to suggest osteomyelitis discitis or septic arthritis. Cord: Evaluation of the distal cord limited by adjacent susceptibility effect. Visualized portions are normal without signal abnormality. No epidural abscess or other collection. Paraspinal and other soft tissues: Postoperative changes from recent fusion present within the lower posterior paraspinous soft tissues. Small collection measuring 5 x 8 x 21 mm seen along the midline incision at the level of T11 (series 22, image 34). No significant subfascial extension. Finding favored to reflect a small postoperative seroma. Superimposed infection not excluded, and could be considered in the correct clinical setting. No other loculated collections. Visualized lungs are grossly clear. Visualized visceral structures unremarkable. Disc levels: C7-T1: Small central disc protrusion indents the ventral thecal sac without significant spinal stenosis (series 22, image 2). No other significant disc pathology seen within the  thoracic spine. No spinal stenosis. She left-sided facet arthrosis at T5-6 through T8-9 with resultant moderate left foraminal stenosis at T5-6, with more mild narrowing at the remaining levels again noted. Mild right foraminal narrowing at T6-7 due to facet arthropathy. MRI LUMBAR SPINE FINDINGS Segmentation: Transitional lumbosacral anatomy with a lumbarized S1 segment. Vertebral body count is made from the dens, with same numbering system utilized as on previous exams. Alignment: 3-4 mm retrolisthesis of L2 on L3, stable. Alignment otherwise normal with preservation of the normal lumbar lordosis. Vertebrae: Extensive susceptibility artifact related to prior fusion at T11 through the sacrum. Interbody devices in place at L3-4, L4-5, and L5-S1. Hardware better evaluated on prior CT. Vertebral body height maintained without acute fracture. Underlying bone marrow signal intensity within normal limits. No worrisome osseous lesions. Heterogeneous signal abnormality about the L2-3 interspace felt to be most consistent with reactive discogenic change. No other signal abnormality. No findings to suggest osteomyelitis discitis or septic arthritis. Conus medullaris: Extends to the L1-2 level and appears normal. No epidural collections. Paraspinal and other soft tissues: Extensive postoperative changes present throughout the lower posterior paraspinous musculature and soft tissues. Small collection at the right laminectomy site  at L2-3 measures 2.5 x 2.4 cm (series 28, image 5). No significant regional mass effect or surrounding edema. Finding favored to reflect a benign postoperative seroma. No other discrete or loculated collections. Moderate distension of the partially visualized urinary bladder noted. Retroaortic left renal vein noted. Disc levels: L1-2: Seen only on sagittal projection. Minimal annular disc bulge. Prior posterior fusion. No stenosis. L2-3: Retrolisthesis. Advanced degenerative intervertebral disc space  narrowing with disc desiccation, diffuse disc bulge, and reactive endplate change, worse on the right. Prior posterior fusion with right laminectomy. No significant residual spinal stenosis. Foramina appear patent. L3-4: Prior posterior and interbody fusion. Bilateral facet hypertrophy. No significant residual spinal stenosis. Foramina appear patent. L4-5: Prior posterior and interbody fusion with posterior decompression. No residual spinal stenosis. Foramina appear patent. L5-S1: Prior posterior and interbody fusion with posterior decompression. No residual canal or foraminal stenosis. S1-2: Negative interspace. Prior posterior fusion. No residual spinal stenosis. Foramina appear patent. IMPRESSION: 1. Postoperative changes from prior fusion at T11 through the sacrum. Extensive postoperative changes throughout the posterior paraspinous soft tissues with superimposed small 5 x 8 x 21 mm collection along the midline incision at the level of T11. Finding favored to reflect a small postoperative seroma, although superimposed infection not excluded, and could be considered in the correct clinical setting. 2. Additional 2.5 x 2.4 cm collection along the right laminectomy site at L2-3, also favored to reflect a postoperative seroma. No significant mass effect. 3. No other evidence for acute infection within the thoracic or lumbar spine. 4. Extensive postoperative changes from prior fusion at T11 through the sacrum. No residual or recurrent spinal stenosis. Electronically Signed   By: Jeannine Boga M.D.   On: 10/22/2020 03:16   MR Lumbar Spine W Wo Contrast  Result Date: 10/22/2020 CLINICAL DATA:  Initial evaluation for acute back pain, concern for midline wound infection, redness and drainage. EXAM: MRI THORACIC AND LUMBAR SPINE WITHOUT AND WITH CONTRAST TECHNIQUE: Multiplanar and multiecho pulse sequences of the thoracic and lumbar spine were obtained without and with intravenous contrast. CONTRAST:  7.90mL  GADAVIST GADOBUTROL 1 MMOL/ML IV SOLN COMPARISON:  Prior CT from earlier the same day as well as previous studies. FINDINGS: MRI THORACIC SPINE FINDINGS Alignment: Trace retrolisthesis of T11 on T12. Underlying trace dextroscoliosis. Vertebrae: Mild chronic wedging of T11 again noted, stable. Vertebral body height otherwise maintained without acute or subacute fracture. Susceptibility artifact related to prior posterior fusion extending from T11 through the sacrum. Hardware better evaluated on prior CT. Underlying bone marrow signal intensity within normal limits. No worrisome osseous lesions. No findings to suggest osteomyelitis discitis or septic arthritis. Cord: Evaluation of the distal cord limited by adjacent susceptibility effect. Visualized portions are normal without signal abnormality. No epidural abscess or other collection. Paraspinal and other soft tissues: Postoperative changes from recent fusion present within the lower posterior paraspinous soft tissues. Small collection measuring 5 x 8 x 21 mm seen along the midline incision at the level of T11 (series 22, image 34). No significant subfascial extension. Finding favored to reflect a small postoperative seroma. Superimposed infection not excluded, and could be considered in the correct clinical setting. No other loculated collections. Visualized lungs are grossly clear. Visualized visceral structures unremarkable. Disc levels: C7-T1: Small central disc protrusion indents the ventral thecal sac without significant spinal stenosis (series 22, image 2). No other significant disc pathology seen within the thoracic spine. No spinal stenosis. She left-sided facet arthrosis at T5-6 through T8-9 with resultant moderate left foraminal stenosis  at T5-6, with more mild narrowing at the remaining levels again noted. Mild right foraminal narrowing at T6-7 due to facet arthropathy. MRI LUMBAR SPINE FINDINGS Segmentation: Transitional lumbosacral anatomy with a  lumbarized S1 segment. Vertebral body count is made from the dens, with same numbering system utilized as on previous exams. Alignment: 3-4 mm retrolisthesis of L2 on L3, stable. Alignment otherwise normal with preservation of the normal lumbar lordosis. Vertebrae: Extensive susceptibility artifact related to prior fusion at T11 through the sacrum. Interbody devices in place at L3-4, L4-5, and L5-S1. Hardware better evaluated on prior CT. Vertebral body height maintained without acute fracture. Underlying bone marrow signal intensity within normal limits. No worrisome osseous lesions. Heterogeneous signal abnormality about the L2-3 interspace felt to be most consistent with reactive discogenic change. No other signal abnormality. No findings to suggest osteomyelitis discitis or septic arthritis. Conus medullaris: Extends to the L1-2 level and appears normal. No epidural collections. Paraspinal and other soft tissues: Extensive postoperative changes present throughout the lower posterior paraspinous musculature and soft tissues. Small collection at the right laminectomy site at L2-3 measures 2.5 x 2.4 cm (series 28, image 5). No significant regional mass effect or surrounding edema. Finding favored to reflect a benign postoperative seroma. No other discrete or loculated collections. Moderate distension of the partially visualized urinary bladder noted. Retroaortic left renal vein noted. Disc levels: L1-2: Seen only on sagittal projection. Minimal annular disc bulge. Prior posterior fusion. No stenosis. L2-3: Retrolisthesis. Advanced degenerative intervertebral disc space narrowing with disc desiccation, diffuse disc bulge, and reactive endplate change, worse on the right. Prior posterior fusion with right laminectomy. No significant residual spinal stenosis. Foramina appear patent. L3-4: Prior posterior and interbody fusion. Bilateral facet hypertrophy. No significant residual spinal stenosis. Foramina appear patent.  L4-5: Prior posterior and interbody fusion with posterior decompression. No residual spinal stenosis. Foramina appear patent. L5-S1: Prior posterior and interbody fusion with posterior decompression. No residual canal or foraminal stenosis. S1-2: Negative interspace. Prior posterior fusion. No residual spinal stenosis. Foramina appear patent. IMPRESSION: 1. Postoperative changes from prior fusion at T11 through the sacrum. Extensive postoperative changes throughout the posterior paraspinous soft tissues with superimposed small 5 x 8 x 21 mm collection along the midline incision at the level of T11. Finding favored to reflect a small postoperative seroma, although superimposed infection not excluded, and could be considered in the correct clinical setting. 2. Additional 2.5 x 2.4 cm collection along the right laminectomy site at L2-3, also favored to reflect a postoperative seroma. No significant mass effect. 3. No other evidence for acute infection within the thoracic or lumbar spine. 4. Extensive postoperative changes from prior fusion at T11 through the sacrum. No residual or recurrent spinal stenosis. Electronically Signed   By: Jeannine Boga M.D.   On: 10/22/2020 03:16      ____________________________________________   PROCEDURES  Procedure(s) performed:yes .1-3 Lead EKG Interpretation Performed by: Rudene Re, MD Authorized by: Rudene Re, MD     Interpretation: non-specific     ECG rate assessment: normal     Rhythm: sinus rhythm     Ectopy: none     Conduction: normal     Critical Care performed:  None ____________________________________________   INITIAL IMPRESSION / ASSESSMENT AND PLAN / ED COURSE   65 y.o. male status post lumbar spinal fusion by Dr. Izora Ribas on 10/13/2020 who presents for concerns of postop infection.  Patient with small amount of cellulitis surrounding the surgical incision.  According to his wife there was  pus coming out of a part of  the scar that is dehisced, no pus seen now.  Patient is diffusely tender to palpation throughout the surgical area with no fluctuance palpable. No signs of cauda equina on history and PE.  Discussed the case with patient's neurosurgeon who recommended CT of the thoracic and lumbar spine, MRI with contrast of both, labs and admission to the hospitalist service.  Recommended against antibiotics unless patient was septic.  At this time there is no signs of sepsis with no fever, no tachycardia, no tachypnea, no leukocytosis.  We will send blood cultures.  IV morphine as needed for pain.  Will discuss to hospitalist for admission.  History gathered from patient and his wife was at bedside.  Old medical records review including notes from patient's neurosurgeon.  Patient placed on telemetry for monitoring of cardiorespiratory status       _____________________________________________ Please note:  Patient was evaluated in Emergency Department today for the symptoms described in the history of present illness. Patient was evaluated in the context of the global COVID-19 pandemic, which necessitated consideration that the patient might be at risk for infection with the SARS-CoV-2 virus that causes COVID-19. Institutional protocols and algorithms that pertain to the evaluation of patients at risk for COVID-19 are in a state of rapid change based on information released by regulatory bodies including the CDC and federal and state organizations. These policies and algorithms were followed during the patient's care in the ED.  Some ED evaluations and interventions may be delayed as a result of limited staffing during the pandemic.   [place] Controlled Substance Database was reviewed by me. ____________________________________________   FINAL CLINICAL IMPRESSION(S) / ED DIAGNOSES   Final diagnoses:  Infection of deep incisional surgical site after procedure, initial encounter      NEW MEDICATIONS STARTED DURING  THIS VISIT:  ED Discharge Orders    None       Note:  This document was prepared using Dragon voice recognition software and may include unintentional dictation errors.    Rudene Re, MD 10/22/20 [phone]

## 2020-10-22 NOTE — Consult Note (Signed)
Referring Physician:  No referring provider defined for this encounter.  Primary Physician:  [facility]  Chief Complaint:  Wound drainage  History of Present Illness: 10/22/2020 Howard Murphy is a 65 y.o. male who presents with the chief complaint of wound drainage. He developed this on 10/20/20. He has had worsening back discomfort and pain around his incision.  He called into the answering service last night, and was instructed by me to present to the ER.  He was stable in ER, admitted and kept NPO for washout.  Review of Systems:  A 10 point review of systems is negative, except for the pertinent positives and negatives detailed in the HPI.  Past Medical History: Past Medical History:  Diagnosis Date  . 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  . Back ache 05/05/2013  . Diabetes mellitus without complication ([place])   . GERD (gastroesophageal reflux disease)   . Headache    MIGRAINES  . History of kidney stones   . Hyperlipemia   . Hypertension   . Kidney stones   . Kidney stones   . Nephrolithiasis   . Shortness of breath dyspnea     Past Surgical History: Past Surgical History:  Procedure Laterality Date  . APPENDECTOMY    . BACK SURGERY  2015   x3  . BACK SURGERY  08/09/2016  . BACK SURGERY Bilateral 2019  . CHOLECYSTECTOMY  2000  . COLONOSCOPY WITH PROPOFOL N/A 04/11/2015   Procedure: COLONOSCOPY WITH PROPOFOL;  Surgeon: Manya Silvas, MD;  Location: [facility] ENDOSCOPY;  Service: Endoscopy;  Laterality: N/A;  . CYSTOSCOPY WITH URETEROSCOPY AND STENT PLACEMENT Right 08/27/2017   Procedure: CYSTOSCOPY WITH URETEROSCOPY AND STENT PLACEMENT;  Surgeon: Abbie Sons, MD;  Location: [facility] ORS;  Service: Urology;  Laterality: Right;  . ESOPHAGOGASTRODUODENOSCOPY (EGD) WITH PROPOFOL N/A 04/11/2015   Procedure: ESOPHAGOGASTRODUODENOSCOPY (EGD) WITH PROPOFOL;  Surgeon: Manya Silvas, MD;  Location: [facility] ENDOSCOPY;  Service: Endoscopy;  Laterality:  N/A;  . LAPAROSCOPIC APPENDECTOMY N/A 12/18/2017   Procedure: APPENDECTOMY LAPAROSCOPIC;  Surgeon: Florene Glen, MD;  Location: [facility] ORS;  Service: General;  Laterality: N/A;  . POSTERIOR LUMBAR FUSION 4 LEVEL N/A 09/14/2020   Procedure: OPEN T10-PELVIS POSTERIOR SPINAL FUSION (HARDWARE ALREADY PRESENT AT L2-S2); L1-2 POSTERIOR SPINAL DECOMPRESSION;  Surgeon: Meade Maw, MD;  Location: [facility] ORS;  Service: Neurosurgery;  Laterality: N/A;  . SHOULDER SURGERY Right 2016   X2  . SHOULDER SURGERY Left 06/2015    Allergies: Allergies as of 10/21/2020 - Review Complete 10/21/2020  Allergen Reaction Noted  . Adhesive [tape] Other (See Comments) 04/04/2018    Medications:  Current Facility-Administered Medications:  .  HYDROmorphone (DILAUDID) injection 0.5 mg, 0.5 mg, Intravenous, Q2H PRN, Howerter, Justin B, DO, 0.5 mg at 10/22/20 0854 .  insulin aspart (novoLOG) injection 0-9 Units, 0-9 Units, Subcutaneous, Q6H, Howerter, Justin B, DO, 1 Units at 10/22/20 0727 .  lactated ringers infusion, , Intravenous, Continuous, Howerter, Justin B, DO, Stopped at 10/22/20 0345 .  naloxone [facility]) injection 0.4 mg, 0.4 mg, Intravenous, PRN, Howerter, Justin B, DO .  ondansetron (ZOFRAN) injection 4 mg, 4 mg, Intravenous, Q6H PRN, Howerter, Ethelda Chick, DO   Social History: Social History   Tobacco Use  . Smoking status: Never Smoker  . Smokeless tobacco: Former Systems developer    Types: Secondary school teacher  . Vaping Use: Never used  Substance Use Topics  . Alcohol use: No  . Drug use: No  Family Medical History: Family History  Problem Relation Age of Onset  . Cancer Mother        Breast CA  . Stroke Father   . Heart disease Father   . Cancer Sister   . Cancer Maternal Grandmother   . Cancer Sister   . Bladder Cancer Neg Hx   . Prostate cancer Neg Hx   . Kidney cancer Neg Hx     Physical Examination: Vitals:   10/22/20 0319 10/22/20 0810  BP: 114/78 114/75  Pulse: 73 68  Resp: 18  16  Temp: 98.3 F (36.8 C) 98.7 F (37.1 C)  SpO2: 95% 95%   Heart sounds normal no MRG. Chest Clear to Auscultation Bilaterally.   General: Patient is well developed, well nourished, calm, collected, and in no apparent distress.  Psychiatric: Patient is non-anxious.  Head:  Pupils equal, round, and reactive to light.  ENT:  Oral mucosa appears well hydrated.  Neck:   Supple.  Full range of motion.  Respiratory: Patient is breathing without any difficulty.  Extremities: No edema.  Vascular: Palpable pulses in dorsal pedal vessels.  Skin:   On exposed skin, there are no abnormal skin lesions.  NEUROLOGICAL:  General: In no acute distress.   Awake, alert, oriented to person, place, and time.  Pupils equal round and reactive to light.  Facial tone is symmetric.  Tongue protrusion is midline.  There is no pronator drift.   Strength: Side Biceps Triceps Deltoid Interossei Grip Wrist Ext. Wrist Flex.  R 5 5 5 5 5 5 5   L 5 5 5 5 5 5 5    Side Iliopsoas Quads Hamstring PF DF EHL  R 5 5 5 5 5 5   L 5 5 5 5 5 5     Bilateral upper and lower extremity sensation is intact to light touch. Reflexes are 2+ and symmetric at the biceps, triceps, brachioradialis, patella and achilles. Hoffman's is absent.  Clonus is not present.  Toes are down-going.    Gait is untested.  Incision has an area of breakdown in the middle of the incision. There is erythema and induration.  Imaging: MRI TL spine 10/22/20 IMPRESSION: 1. Postoperative changes from prior fusion at T11 through the sacrum. Extensive postoperative changes throughout the posterior paraspinous soft tissues with superimposed small 5 x 8 x 21 mm collection along the midline incision at the level of T11. Finding favored to reflect a small postoperative seroma, although superimposed infection not excluded, and could be considered in the correct clinical setting. 2. Additional 2.5 x 2.4 cm collection along the right  laminectomy site at L2-3, also favored to reflect a postoperative seroma. No significant mass effect. 3. No other evidence for acute infection within the thoracic or lumbar spine. 4. Extensive postoperative changes from prior fusion at T11 through the sacrum. No residual or recurrent spinal stenosis.   Electronically Signed   By: Jeannine Boga M.D.   On: 10/22/2020 03:16  CT TL spine 10/22/20 IMPRESSION: 1. Postoperative changes from prior posterior fusion from T11 through the sacrum, stable from previous without complication. Similar postoperative changes within the posterior paraspinous soft tissues without visible collection by CT. 2. 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 3. Aortic Atherosclerosis (ICD10-I70.0).   Electronically Signed   By: Jeannine Boga M.D.   On: 10/22/2020 01:09  I have personally reviewed the images and agree with the above interpretation.  Labs: CBC Latest Ref Rng & Units 10/22/2020 10/21/2020  09/27/2020  WBC 4.0 - 10.5 K/uL 9.3 8.6 10.4  Hemoglobin 13.0 - 17.0 g/dL 13.9 13.9 13.4  Hematocrit 39.0 - 52.0 % 42.3 43.1 39.8  Platelets 150 - 400 K/uL 211 215 417(H)    Assessment and Plan: Mr. Mcmanaman is a pleasant 65 y.o. male with surgical site infection.  - plan for washout  I discussed the planned procedure at length with the patient, including the risks, benefits, alternatives, and indications. The risks discussed include but are not limited to bleeding, infection, need for reoperation, spinal fluid leak, stroke, vision loss, anesthetic complication, coma, paralysis, and even death. I also described in detail that improvement was not guaranteed.  The patient expressed understanding of these risks, and asked that we proceed with surgery. I described the surgery in layman's terms, and gave ample opportunity for questions, which were answered to the best of my ability.  He  will have cultures taken, then start antiobiotics.  I will consult ID after surgery.  Appreciate medicine help with medical issues and diabetes.  Darcus Edds K. Izora Ribas MD, [facility] of [facility]

## 2020-10-22 NOTE — ED Notes (Signed)
Late entry - comprehensive note - assumed care of pt upon being roomed. Pt assisted in changing to gown by RN and visitor at bedside. Surgical incision noted on mid to lower back with scabbing and an approximately half inch to inch diameter open section with pustulant coruscations around the edges. Pt medicated per [facility], stated "morphine doesn't work only dilaudid." CT completed. Medicated with ativan per [facility] for claustrophobia at MRI scanner. Awaiting inpatient ready bed at this time. Pt aware of plan for admission and surgical washout. AOx4, breathing regular and unlabored. Pt at MRI at this time.

## 2020-10-22 NOTE — Op Note (Addendum)
Indications: MR. Reha is a 65 yo male who is 4.5 weeks s/p extension of fusion.  He presented with wound drainage concerning for infection.  Findings: possible infection, but no frank pus  Preoperative Diagnosis: concern for wound infection Postoperative Diagnosis: same   EBL: 100 ml IVF: 900 ml Drains: 3 placed Disposition: Extubated and Stable to PACU Complications: none  No foley catheter was placed.   Preoperative Note:   Risks of surgery discussed include: infection, bleeding, stroke, coma, death, paralysis, CSF leak, nerve/spinal cord injury, numbness, tingling, weakness, complex regional pain syndrome, recurrent stenosis and/or disc herniation, vascular injury, development of instability, neck/back pain, need for further surgery, persistent symptoms, development of deformity, and the risks of anesthesia. The patient understood these risks and agreed to proceed.  Operative Note:  1. Irrigation and debridement of surgical wound   The patient was brought to the Operating Room, intubated and turned into the prone position. All pressure points were checked and double checked. Flouroscopy was used to mark the incision. The patient was prepped and draped in the standard fashion. A full timeout was performed. Preoperative antibiotics were held, then given later after cultures were taken.  The prior incision was opened with a scalpel, then the soft tissues divided with the Bovie. Self-retaining retractors were placed. The paraspinus muscles were reflected laterally in subperiosteal fashion until the implants were visible.  No frank pus was expressed from the incision.  Both epifascial and subfascial cultures were taken.    At this point, I excisionally debrided some deep tissue below the fascia until healthy bleeding tissue was noted.  Additionally, I debrided an area of skin that was not healing well.  We then irrigated with 4L of irrigation.  2 Deep drains were placed.   Vancomycin powder was placed below the fascia.  The wound was then closed in layers.  A superficial drain was placed.  An incisional wound vac was placed.  The patient was then flipped supine and extubated with incident. All counts were correct times 2 at the end of the case. No immediate complications were noted.  I performed the procedure without assistance.

## 2020-10-22 NOTE — Consult Note (Signed)
Pharmacy Antibiotic Note  Howard Murphy is a 65 y.o. male admitted on 10/21/2020 with suspected wound infection 4.5 s/p orthopedic fusion surgery. Pharmacy has been consulted for Vancomycin dosing. Patient also on cefepime 2 gram Q8H 5/14 I&D of surgical wound. Vancomycin 2 gram cement beads inserted  Plan:  Vancomcyin 1750 mg LD x 1   Initiate Vancomycin maintenance 1250 mg Q12H. Goal AUC 400-550  Expected AUC 548  Scr used: 0.8  Follow up I&D cultures  Monitor renal function daily  Vancomycin levels at steady state if warranted   Height: 5\' 7"  (170.2 cm) Weight: 83.5 kg (184 lb) IBW/kg (Calculated) : 66.1  Temp (24hrs), Avg:98 F (36.7 C), Min:97.5 F (36.4 C), Max:98.7 F (37.1 C)  Recent Labs  Lab 10/21/20 2122 10/22/20 0455  WBC 8.6 9.3  CREATININE 0.98 0.80    Estimated Creatinine Clearance: 95.2 mL/min (by C-G formula based on SCr of 0.8 mg/dL).    Allergies  Allergen Reactions  . Adhesive [Tape] Other (See Comments)    Honeycomb dressing, blisters    Antimicrobials this admission: 5/14 cefepime>>  5/14 vancomycin >>   Dose adjustments this admission: n/a  Microbiology results: 5/14: Wound culture: sent 5/14 BCx: sent  Thank you for allowing pharmacy to be a part of this patient's care.  Dorothe Pea, PharmD, BCPS 10/22/2020 3:24 PM

## 2020-10-22 NOTE — Progress Notes (Signed)
Brief note regarding plan, with full H&P to follow:  65 year old male with history of type 2 diabetes mellitus underwent open T10-pelvis posterior spinal fusion as well as L1-L2 posterior spinal decompression on 09/14/2020, who is admitted with concern for associated midline wound infection of the associated incisional surgical site and presenting with progressive surgical site pain, erythema, swelling, and some purulent drainage.  Dr. Izora Ribas of neurosurgery has been consulted and asked that the patient be made n.p.o., in anticipation of washout of this incisional surgical site later today (10/22/20).  He also requested further laboratory evaluation via CRP ESR, and urinalysis, in addition to further imaging with CT of the thoracic/lumbar spine without contrast as well as MRI of the thoracic/lumbar spine with and without contrast, and requested that no antibiotics be given preoperatively unless the patient becomes septic.  The above requested labs and imaging of been ordered, with results pending.  Admitted to the hospitalist service, with plan for symptomatic/supportive management leading up to anticipated washout, as above.  As needed IV Dilaudid.     Babs Bertin, DO Hospitalist

## 2020-10-23 ENCOUNTER — Other Ambulatory Visit: Payer: Self-pay

## 2020-10-23 LAB — CBC
HCT: 41.8 % (ref 39.0–52.0)
Hemoglobin: 13.7 g/dL (ref 13.0–17.0)
MCH: 28.4 pg (ref 26.0–34.0)
MCHC: 32.8 g/dL (ref 30.0–36.0)
MCV: 86.5 fL (ref 80.0–100.0)
Platelets: 231 10*3/uL (ref 150–400)
RBC: 4.83 MIL/uL (ref 4.22–5.81)
RDW: 13.2 % (ref 11.5–15.5)
WBC: 16.5 10*3/uL — ABNORMAL HIGH (ref 4.0–10.5)
nRBC: 0 % (ref 0.0–0.2)

## 2020-10-23 LAB — GLUCOSE, CAPILLARY
Glucose-Capillary: 142 mg/dL — ABNORMAL HIGH (ref 70–99)
Glucose-Capillary: 161 mg/dL — ABNORMAL HIGH (ref 70–99)
Glucose-Capillary: 190 mg/dL — ABNORMAL HIGH (ref 70–99)
Glucose-Capillary: 147 mg/dL — ABNORMAL HIGH (ref 70–99)

## 2020-10-23 LAB — BASIC METABOLIC PANEL
Anion gap: 10 (ref 5–15)
BUN: 20 mg/dL (ref 8–23)
CO2: 24 mmol/L (ref 22–32)
Calcium: 8.9 mg/dL (ref 8.9–10.3)
Chloride: 103 mmol/L (ref 98–111)
Creatinine, Ser: 0.71 mg/dL (ref 0.61–1.24)
GFR, Estimated: >60 mL/min (ref >60)
Glucose, Bld: 185 mg/dL — ABNORMAL HIGH (ref 70–99)
Potassium: 3.9 mmol/L (ref 3.5–5.1)
Sodium: 137 mmol/L (ref 135–145)

## 2020-10-23 LAB — MAGNESIUM: Magnesium: 1.9 mg/dL (ref 1.7–2.4)

## 2020-10-23 NOTE — Progress Notes (Signed)
    Attending Progress Note 09/14/20 - Extension of Fusion to T10, L1-2 decompression 10/22/20 - washout and reclosure  History: Howard Murphy is here for possible wound infection.  POD1: Feeling better than yesterday.  Pain control adequate.  Physical Exam: Vitals:   10/23/20 0403 10/23/20 0749  BP: (!) 106/56 114/80  Pulse: 83 78  Resp: 16 14  Temp: 98.2 F (36.8 C) 98.8 F (37.1 C)  SpO2: 94% 97%    AA Ox3 CNI  Strength:5/5 throughout BLE  Data:  Recent Labs  Lab 10/21/20 2122 10/22/20 0455 10/23/20 0456  NA 136 137 137  K 3.6 3.3* 3.9  CL 99 99 103  CO2 28 30 24   BUN 16 12 20   CREATININE 0.98 0.80 0.71  GLUCOSE 176* 127* 185*  CALCIUM 8.9 8.8* 8.9   No results for input(s): AST, ALT, ALKPHOS in the last 168 hours.  Invalid input(s): TBILI   Recent Labs  Lab 10/21/20 2122 10/22/20 0455 10/23/20 0456  WBC 8.6 9.3 16.5*  HGB 13.9 13.9 13.7  HCT 43.1 42.3 41.8  PLT 215 211 231   No results for input(s): APTT, INR in the last 168 hours.       Other tests/results: Cultures pending  Assessment/Plan:  Howard Murphy is stable after washout and reclosure  - mobilize - pain control - DVT prophylaxis - PTOT - Await culture results - appreciate ID consultation - continue abx pending culture results. - Continue drains - hopefully will remove 1 deep drain tomorrow   Meade Maw MD, [facility] Department of Neurosurgery

## 2020-10-23 NOTE — Plan of Care (Signed)
  Problem: Activity: Goal: Risk for activity intolerance will decrease Outcome: Progressing   Problem: Coping: Goal: Level of anxiety will decrease Outcome: Progressing   Problem: Pain Managment: Goal: General experience of comfort will improve Outcome: Progressing   Problem: Safety: Goal: Ability to remain free from injury will improve Outcome: Progressing   Problem: Skin Integrity: Goal: Risk for impaired skin integrity will decrease Outcome: Progressing   

## 2020-10-23 NOTE — Progress Notes (Signed)
PROGRESS NOTE    Howard Murphy  [id number] DOB: Oct 15, 1955 DOA: 10/21/2020 PCP: [facility]  [id number]/[id number]-AA   Assessment & Plan:   Principal Problem:   Wound infection after surgery Active Problems:   GERD (gastroesophageal reflux disease)   Hyperlipidemia   Type 2 diabetes mellitus ([place])   Benign prostatic hyperplasia   Low back pain   Howard Murphy is a 65 y.o. male with medical history significant for lumbar spinal stenosis status post recent open T10 -pelvis posterior spinal fusion and L1-L2 posterior spinal decompression, hyperlipidemia, type 2 diabetes mellitus, who is admitted to [facility] on 10/21/2020 with concern for thoracic/lumbar wound infection of midline incisional surgical site after presenting from home to [facility] ED complaining of incisional surgical site erythema.   In the setting of lumbar spinal stenosis, the patient underwent open T10 -pelvis posterior spinal fusion and L1-L2 posterior spinal decompression on 09/14/2020 with Howard Murphy of neurosurgery.  The patient reports that this occurred approximately 2 years after preceding lumbar spine surgery, also performed by Howard Murphy.  Patient reports approximately 2 weeks of progressive erythema, tenderness, swelling associated with thoracic/lumbar midline incisional surgical site.  In the interval, the patient followed up in clinic with Howard Murphy on 10/13/2020, at which time he conveys that he was not experiencing any significant drainage from the site.  Conservative measures were elected, however, in the interval since this outpatient follow-up, the patient reports further progression of the incisional site erythema, swelling, tenderness as well as interval development of drainage, that has been intermittently purulent in nature.  The patient denies any new numbness or paresthesias involving the lower extremities, and denies any saddle anesthesia, new onset urinary retention  or fecal incontinence.  Denies any associated subjective fever, chills, rigors, or generalized myalgias.  The setting of the above, the patient contacted Dr Izora Murphy on 10/21/2020, with ensuing instructions to present to [facility] ED for further evaluation and management of suspected incisional surgical site infection.    # thoracic/lumbar wound infection of midline incisional surgical site:  Following lumbar spinal stenosis status post recent open T10 -pelvis posterior spinal fusion and L1-L2 posterior spinal decompression performed on 09/14/2020, patient presents with approximately 2 weeks of progressive incisional surgical site erythema, swelling, tenderness, with more recent development of purulent drainage.  --OR I/D on 5/14 with Howard Murphy Plan: --cont empiric vanc/cefepime --ID to see pt on Monday --f/u OR culture --monitor drain output --pain control  # DM2, non-insulin-dependent, well controlled --Hold home Januvia and Empagliflozin while inpatient --SSI for now  # HLD --cont home statin  # GERD --cont home PPI  # BPH --cont home Flomax   DVT prophylaxis: Lovenox SQ Code Status: Full code  Family Communication: wife updated at bedside today Level of care: Med-Surg Dispo:   The patient is from: home Anticipated d/c is to: home Anticipated d/c date is: 2-3 days Patient currently is not medically ready to d/c due to: post-I/D, ID consult planned   Subjective and Interval History:  Muscle cramp resolved.  Pt was out of bed for a shower today.  Noted swelling around the incision site, tender.   Objective: Vitals:   10/23/20 0403 10/23/20 0500 10/23/20 0749 10/23/20 1202  BP: (!) 106/56  114/80 (!) 115/54  Pulse: 83  78 90  Resp: 16  14 18   Temp: 98.2 F (36.8 C)  98.8 F (37.1 C) 98.5 F (36.9 C)  TempSrc:      SpO2: 94%  97%  94%  Weight:  81.5 kg    Height:        Intake/Output Summary (Last 24 hours) at 10/23/2020 1447 Last data filed at 10/23/2020  1400 Gross per 24 hour  Intake 1650 ml  Output 700 ml  Net 950 ml   Filed Weights   10/21/20 2117 10/23/20 0500  Weight: 83.5 kg 81.5 kg    Examination:   Constitutional: NAD, AAOx3 HEENT: conjunctivae and lids normal, EOMI CV: No cyanosis.   RESP: normal respiratory effort, on RA Extremities: No effusions, edema in BLE SKIN: warm, dry MSK: surgical dressing over mid back, areas around the dressing raised with swelling, tender to palpation.  3 drains present with mostly sanguinous drainage Neuro: II - XII grossly intact.   Psych: Normal mood and affect.  Appropriate judgement and reason   Data Reviewed: I have personally reviewed following labs and imaging studies  CBC: Recent Labs  Lab 10/21/20 2122 10/22/20 0455 10/23/20 0456  WBC 8.6 9.3 16.5*  NEUTROABS 4.5 5.3  --   HGB 13.9 13.9 13.7  HCT 43.1 42.3 41.8  MCV 88.3 87.2 86.5  PLT 215 211 [id number]   Basic Metabolic Panel: Recent Labs  Lab 10/21/20 2122 10/22/20 0455 10/23/20 0456  NA 136 137 137  K 3.6 3.3* 3.9  CL 99 99 103  CO2 28 30 24   GLUCOSE 176* 127* 185*  BUN 16 12 20   CREATININE 0.98 0.80 0.71  CALCIUM 8.9 8.8* 8.9  MG  --  1.6* 1.9   GFR: Estimated Creatinine Clearance: 94.1 mL/min (by C-G formula based on SCr of 0.71 mg/dL). Liver Function Tests: No results for input(s): AST, ALT, ALKPHOS, BILITOT, PROT, ALBUMIN in the last 168 hours. No results for input(s): LIPASE, AMYLASE in the last 168 hours. No results for input(s): AMMONIA in the last 168 hours. Coagulation Profile: No results for input(s): INR, PROTIME in the last 168 hours. Cardiac Enzymes: No results for input(s): CKTOTAL, CKMB, CKMBINDEX, TROPONINI in the last 168 hours. BNP (last 3 results) No results for input(s): PROBNP in the last 8760 hours. HbA1C: No results for input(s): HGBA1C in the last 72 hours. CBG: Recent Labs  Lab 10/22/20 1257 10/22/20 1751 10/22/20 2204 10/23/20 0751 10/23/20 1200  GLUCAP 147* 245* 217*  142* 161*   Lipid Profile: No results for input(s): CHOL, HDL, LDLCALC, TRIG, CHOLHDL, LDLDIRECT in the last 72 hours. Thyroid Function Tests: No results for input(s): TSH, T4TOTAL, FREET4, T3FREE, THYROIDAB in the last 72 hours. Anemia Panel: No results for input(s): VITAMINB12, FOLATE, FERRITIN, TIBC, IRON, RETICCTPCT in the last 72 hours. Sepsis Labs: No results for input(s): PROCALCITON, LATICACIDVEN in the last 168 hours.  Recent Results (from the past 240 hour(s))  Blood culture (routine x 2)     Status: None (Preliminary result)   Collection Time: 10/22/20 12:20 AM   Specimen: BLOOD  Result Value Ref Range Status   Specimen Description BLOOD BLOOD RIGHT FOREARM  Final   Special Requests   Final    BOTTLES DRAWN AEROBIC AND ANAEROBIC Blood Culture results may not be optimal due to an inadequate volume of blood received in culture bottles   Culture   Final    NO GROWTH 1 DAY Performed at [facility], [address] [place], [place] [address]    Report Status PENDING  Incomplete  Blood culture (routine x 2)     Status: None (Preliminary result)   Collection Time: 10/22/20 12:20 AM   Specimen: BLOOD  Result  Value Ref Range Status   Specimen Description BLOOD RIGHT ANTECUBITAL  Final   Special Requests   Final    BOTTLES DRAWN AEROBIC AND ANAEROBIC Blood Culture results may not be optimal due to an inadequate volume of blood received in culture bottles   Culture   Final    NO GROWTH 1 DAY Performed at [facility], [address] [place], [place] [address]    Report Status PENDING  Incomplete  Resp Panel by RT-PCR (Flu A&B, Covid) Nasopharyngeal Swab     Status: None   Collection Time: 10/22/20 12:20 AM   Specimen: Nasopharyngeal Swab; Nasopharyngeal(NP) swabs in vial transport medium  Result Value Ref Range Status   SARS Coronavirus 2 by RT PCR NEGATIVE NEGATIVE Final    Comment: (NOTE) SARS-CoV-2 target nucleic acids are NOT DETECTED.  The  SARS-CoV-2 RNA is generally detectable in upper respiratory specimens during the acute phase of infection. The lowest concentration of SARS-CoV-2 viral copies this assay can detect is 138 copies/mL. A negative result does not preclude SARS-Cov-2 infection and should not be used as the sole basis for treatment or other patient management decisions. A negative result may occur with  improper specimen collection/handling, submission of specimen other than nasopharyngeal swab, presence of viral mutation(s) within the areas targeted by this assay, and inadequate number of viral copies(<138 copies/mL). A negative result must be combined with clinical observations, patient history, and epidemiological information. The expected result is Negative.  Fact Sheet for Patients:  EntrepreneurPulse.com.au  Fact Sheet for Healthcare Providers:  IncredibleEmployment.be  This test is no t yet approved or cleared by the [place] FDA and  has been authorized for detection and/or diagnosis of SARS-CoV-2 by FDA under an Emergency Use Authorization (EUA). This EUA will remain  in effect (meaning this test can be used) for the duration of the COVID-19 declaration under Section 564(b)(1) of the Act, 21 U.S.C.section 360bbb-3(b)(1), unless the authorization is terminated  or revoked sooner.       Influenza A by PCR NEGATIVE NEGATIVE Final   Influenza B by PCR NEGATIVE NEGATIVE Final    Comment: (NOTE) The Xpert Xpress SARS-CoV-2/FLU/RSV plus assay is intended as an aid in the diagnosis of influenza from Nasopharyngeal swab specimens and should not be used as a sole basis for treatment. Nasal washings and aspirates are unacceptable for Xpert Xpress SARS-CoV-2/FLU/RSV testing.  Fact Sheet for Patients: EntrepreneurPulse.com.au  Fact Sheet for Healthcare Providers: IncredibleEmployment.be  This test is not yet approved or  cleared by the [place] FDA and has been authorized for detection and/or diagnosis of SARS-CoV-2 by FDA under an Emergency Use Authorization (EUA). This EUA will remain in effect (meaning this test can be used) for the duration of the COVID-19 declaration under Section 564(b)(1) of the Act, 21 U.S.C. section 360bbb-3(b)(1), unless the authorization is terminated or revoked.  Performed at [facility], [address] [place], [place] [address]   Aerobic/Anaerobic Culture w Gram Stain (surgical/deep wound)     Status: None (Preliminary result)   Collection Time: 10/22/20 11:46 AM   Specimen: PATH Disc; Tissue  Result Value Ref Range Status   Specimen Description   Final    WOUND Performed at [facility], [address] [place], [place] [address]    Special Requests LUMBAR SUBFASCIAL FLUID ID A  Final   Gram Stain PENDING  Incomplete   Culture   Final    NO GROWTH < 24 HOURS Performed at [facility], [address], [place], [place] [address]    Report Status PENDING  Incomplete  Aerobic/Anaerobic Culture w Gram Stain (surgical/deep wound)     Status: None (Preliminary result)   Collection Time: 10/22/20 11:48 AM   Specimen: PATH Disc; Tissue  Result Value Ref Range Status   Specimen Description   Final    WOUND Performed at [facility], [address] [place], [place] [address]    Special Requests LUMBAR EPIFASCIAL FLUID ID B  Final   Gram Stain PENDING  Incomplete   Culture   Final    NO GROWTH < 24 HOURS Performed at [facility], [place] [address], [place], [place] [address]    Report Status PENDING  Incomplete      Radiology Studies: CT Thoracic Spine Wo Contrast  Result Date: 10/22/2020 CLINICAL DATA:  Initial evaluation for low back pain, infection or inflammation suspected. EXAM: CT THORACIC AND LUMBAR SPINE WITHOUT CONTRAST TECHNIQUE: Multidetector CT imaging of the thoracic and lumbar spine was performed without  contrast. Multiplanar CT image reconstructions were also generated. COMPARISON:  Prior study from 09/26/2020. FINDINGS: CT THORACIC SPINE FINDINGS Alignment: Trace retrolisthesis of T11 on T12, stable. Alignment otherwise normal. Vertebrae: No acute or interval fracture. No discrete or worrisome osseous lesions. Mild chronic anterior wedging of the T11 vertebral body, stable. Posterior fusion extending from T11 through the sacrum. Hardware remains well positioned without evidence for loosening or other complication. Paraspinal and other soft tissues: Mild postoperative stranding present within the posterior paraspinous soft tissues extending from T8-9 inferiorly. No discrete collections evident by CT. Remainder of the paraspinous soft tissues otherwise unremarkable. Partially visualized lungs are clear. Disc levels: Anterior bridging osteophytes extending from T7 through L1. No new or progressive spinal stenosis. Left-sided facet arthrosis at T5-6-T8-9 with resultant moderate left foraminal stenosis at T5-6, with more mild narrowing at the remaining levels. Mild right foraminal narrowing at T6-7 due to facet arthrosis. Appearance is stable. CT LUMBAR SPINE FINDINGS Segmentation: Transitional lumbosacral anatomy with a lumbarized S1 segment. Same numbering system employed as on previous exam. Alignment: Mild retrolisthesis of L2 on L3, stable. Alignment otherwise normal. Vertebrae: Posterior fusion extending from T11 through the sacrum. Hardware in stable position and is well aligned without loosening or other complication. Sequelae of prior screw removal on the right again noted at L5. Bilateral iliac screws remain in place traversing both SI joints. Interbody implants in place at L3-4, L4-5, and L5-S1, stable in position. Vertebral body height maintained without fracture. No discrete or worrisome osseous lesions. Paraspinal and other soft tissues: Postoperative changes within the posterior paraspinous soft tissues  with expected postoperative stranding. No visible collections evident by CT. Aortic atherosclerosis noted. Disc levels: L1-2: Posterior fusion. Residual disc bulge with facet hypertrophy without significant stenosis. L2-3: Advanced degenerative intervertebral disc space narrowing with disc bulge, disc desiccation, and reactive endplate change, worse on the right. Associated degenerative sclerosis. Prior right laminectomy. Residual bilateral facet arthrosis. No spinal stenosis. Mild bilateral neural foraminal stenosis due to spurring, stable. L3-4 through S1-2. Previous fusion with similar appearance to previous exam. No significant residual stenosis. IMPRESSION: 1. Postoperative changes from prior posterior fusion from T11 through the sacrum, stable from previous without complication. Similar postoperative changes within the posterior paraspinous soft tissues without visible collection by CT. 2. 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 3. Aortic Atherosclerosis (ICD10-I70.0). Electronically Signed   By: Jeannine Boga M.D.   On: 10/22/2020 01:09  CT Lumbar Spine Wo Contrast  Result Date: 10/22/2020 CLINICAL DATA:  Initial evaluation for low back pain, infection or inflammation suspected. EXAM: CT THORACIC AND LUMBAR SPINE WITHOUT CONTRAST TECHNIQUE: Multidetector CT imaging of the thoracic and lumbar spine was performed without contrast. Multiplanar CT image reconstructions were also generated. COMPARISON:  Prior study from 09/26/2020. FINDINGS: CT THORACIC SPINE FINDINGS Alignment: Trace retrolisthesis of T11 on T12, stable. Alignment otherwise normal. Vertebrae: No acute or interval fracture. No discrete or worrisome osseous lesions. Mild chronic anterior wedging of the T11 vertebral body, stable. Posterior fusion extending from T11 through the sacrum. Hardware remains well positioned without evidence for loosening or other complication. Paraspinal  and other soft tissues: Mild postoperative stranding present within the posterior paraspinous soft tissues extending from T8-9 inferiorly. No discrete collections evident by CT. Remainder of the paraspinous soft tissues otherwise unremarkable. Partially visualized lungs are clear. Disc levels: Anterior bridging osteophytes extending from T7 through L1. No new or progressive spinal stenosis. Left-sided facet arthrosis at T5-6-T8-9 with resultant moderate left foraminal stenosis at T5-6, with more mild narrowing at the remaining levels. Mild right foraminal narrowing at T6-7 due to facet arthrosis. Appearance is stable. CT LUMBAR SPINE FINDINGS Segmentation: Transitional lumbosacral anatomy with a lumbarized S1 segment. Same numbering system employed as on previous exam. Alignment: Mild retrolisthesis of L2 on L3, stable. Alignment otherwise normal. Vertebrae: Posterior fusion extending from T11 through the sacrum. Hardware in stable position and is well aligned without loosening or other complication. Sequelae of prior screw removal on the right again noted at L5. Bilateral iliac screws remain in place traversing both SI joints. Interbody implants in place at L3-4, L4-5, and L5-S1, stable in position. Vertebral body height maintained without fracture. No discrete or worrisome osseous lesions. Paraspinal and other soft tissues: Postoperative changes within the posterior paraspinous soft tissues with expected postoperative stranding. No visible collections evident by CT. Aortic atherosclerosis noted. Disc levels: L1-2: Posterior fusion. Residual disc bulge with facet hypertrophy without significant stenosis. L2-3: Advanced degenerative intervertebral disc space narrowing with disc bulge, disc desiccation, and reactive endplate change, worse on the right. Associated degenerative sclerosis. Prior right laminectomy. Residual bilateral facet arthrosis. No spinal stenosis. Mild bilateral neural foraminal stenosis due to  spurring, stable. L3-4 through S1-2. Previous fusion with similar appearance to previous exam. No significant residual stenosis. IMPRESSION: 1. Postoperative changes from prior posterior fusion from T11 through the sacrum, stable from previous without complication. Similar postoperative changes within the posterior paraspinous soft tissues without visible collection by CT. 2. 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 3. Aortic Atherosclerosis (ICD10-I70.0). Electronically Signed   By: Jeannine Boga M.D.   On: 10/22/2020 01:09   MR THORACIC SPINE W WO CONTRAST  Result Date: 10/22/2020 CLINICAL DATA:  Initial evaluation for acute back pain, concern for midline wound infection, redness and drainage. EXAM: MRI THORACIC AND LUMBAR SPINE WITHOUT AND WITH CONTRAST TECHNIQUE: Multiplanar and multiecho pulse sequences of the thoracic and lumbar spine were obtained without and with intravenous contrast. CONTRAST:  7.31mL GADAVIST GADOBUTROL 1 MMOL/ML IV SOLN COMPARISON:  Prior CT from earlier the same day as well as previous studies. FINDINGS: MRI THORACIC SPINE FINDINGS Alignment: Trace retrolisthesis of T11 on T12. Underlying trace dextroscoliosis. Vertebrae: Mild chronic wedging of T11 again noted, stable. Vertebral body height otherwise maintained without acute or subacute fracture. Susceptibility artifact related to prior posterior fusion extending from T11 through the sacrum. Hardware better evaluated on prior CT. Underlying bone  marrow signal intensity within normal limits. No worrisome osseous lesions. No findings to suggest osteomyelitis discitis or septic arthritis. Cord: Evaluation of the distal cord limited by adjacent susceptibility effect. Visualized portions are normal without signal abnormality. No epidural abscess or other collection. Paraspinal and other soft tissues: Postoperative changes from recent fusion present within the lower posterior  paraspinous soft tissues. Small collection measuring 5 x 8 x 21 mm seen along the midline incision at the level of T11 (series 22, image 34). No significant subfascial extension. Finding favored to reflect a small postoperative seroma. Superimposed infection not excluded, and could be considered in the correct clinical setting. No other loculated collections. Visualized lungs are grossly clear. Visualized visceral structures unremarkable. Disc levels: C7-T1: Small central disc protrusion indents the ventral thecal sac without significant spinal stenosis (series 22, image 2). No other significant disc pathology seen within the thoracic spine. No spinal stenosis. She left-sided facet arthrosis at T5-6 through T8-9 with resultant moderate left foraminal stenosis at T5-6, with more mild narrowing at the remaining levels again noted. Mild right foraminal narrowing at T6-7 due to facet arthropathy. MRI LUMBAR SPINE FINDINGS Segmentation: Transitional lumbosacral anatomy with a lumbarized S1 segment. Vertebral body count is made from the dens, with same numbering system utilized as on previous exams. Alignment: 3-4 mm retrolisthesis of L2 on L3, stable. Alignment otherwise normal with preservation of the normal lumbar lordosis. Vertebrae: Extensive susceptibility artifact related to prior fusion at T11 through the sacrum. Interbody devices in place at L3-4, L4-5, and L5-S1. Hardware better evaluated on prior CT. Vertebral body height maintained without acute fracture. Underlying bone marrow signal intensity within normal limits. No worrisome osseous lesions. Heterogeneous signal abnormality about the L2-3 interspace felt to be most consistent with reactive discogenic change. No other signal abnormality. No findings to suggest osteomyelitis discitis or septic arthritis. Conus medullaris: Extends to the L1-2 level and appears normal. No epidural collections. Paraspinal and other soft tissues: Extensive postoperative changes  present throughout the lower posterior paraspinous musculature and soft tissues. Small collection at the right laminectomy site at L2-3 measures 2.5 x 2.4 cm (series 28, image 5). No significant regional mass effect or surrounding edema. Finding favored to reflect a benign postoperative seroma. No other discrete or loculated collections. Moderate distension of the partially visualized urinary bladder noted. Retroaortic left renal vein noted. Disc levels: L1-2: Seen only on sagittal projection. Minimal annular disc bulge. Prior posterior fusion. No stenosis. L2-3: Retrolisthesis. Advanced degenerative intervertebral disc space narrowing with disc desiccation, diffuse disc bulge, and reactive endplate change, worse on the right. Prior posterior fusion with right laminectomy. No significant residual spinal stenosis. Foramina appear patent. L3-4: Prior posterior and interbody fusion. Bilateral facet hypertrophy. No significant residual spinal stenosis. Foramina appear patent. L4-5: Prior posterior and interbody fusion with posterior decompression. No residual spinal stenosis. Foramina appear patent. L5-S1: Prior posterior and interbody fusion with posterior decompression. No residual canal or foraminal stenosis. S1-2: Negative interspace. Prior posterior fusion. No residual spinal stenosis. Foramina appear patent. IMPRESSION: 1. Postoperative changes from prior fusion at T11 through the sacrum. Extensive postoperative changes throughout the posterior paraspinous soft tissues with superimposed small 5 x 8 x 21 mm collection along the midline incision at the level of T11. Finding favored to reflect a small postoperative seroma, although superimposed infection not excluded, and could be considered in the correct clinical setting. 2. Additional 2.5 x 2.4 cm collection along the right laminectomy site at L2-3, also favored to reflect a postoperative seroma.  No significant mass effect. 3. No other evidence for acute infection  within the thoracic or lumbar spine. 4. Extensive postoperative changes from prior fusion at T11 through the sacrum. No residual or recurrent spinal stenosis. Electronically Signed   By: Jeannine Boga M.D.   On: 10/22/2020 03:16   MR Lumbar Spine W Wo Contrast  Result Date: 10/22/2020 CLINICAL DATA:  Initial evaluation for acute back pain, concern for midline wound infection, redness and drainage. EXAM: MRI THORACIC AND LUMBAR SPINE WITHOUT AND WITH CONTRAST TECHNIQUE: Multiplanar and multiecho pulse sequences of the thoracic and lumbar spine were obtained without and with intravenous contrast. CONTRAST:  7.56mL GADAVIST GADOBUTROL 1 MMOL/ML IV SOLN COMPARISON:  Prior CT from earlier the same day as well as previous studies. FINDINGS: MRI THORACIC SPINE FINDINGS Alignment: Trace retrolisthesis of T11 on T12. Underlying trace dextroscoliosis. Vertebrae: Mild chronic wedging of T11 again noted, stable. Vertebral body height otherwise maintained without acute or subacute fracture. Susceptibility artifact related to prior posterior fusion extending from T11 through the sacrum. Hardware better evaluated on prior CT. Underlying bone marrow signal intensity within normal limits. No worrisome osseous lesions. No findings to suggest osteomyelitis discitis or septic arthritis. Cord: Evaluation of the distal cord limited by adjacent susceptibility effect. Visualized portions are normal without signal abnormality. No epidural abscess or other collection. Paraspinal and other soft tissues: Postoperative changes from recent fusion present within the lower posterior paraspinous soft tissues. Small collection measuring 5 x 8 x 21 mm seen along the midline incision at the level of T11 (series 22, image 34). No significant subfascial extension. Finding favored to reflect a small postoperative seroma. Superimposed infection not excluded, and could be considered in the correct clinical setting. No other loculated collections.  Visualized lungs are grossly clear. Visualized visceral structures unremarkable. Disc levels: C7-T1: Small central disc protrusion indents the ventral thecal sac without significant spinal stenosis (series 22, image 2). No other significant disc pathology seen within the thoracic spine. No spinal stenosis. She left-sided facet arthrosis at T5-6 through T8-9 with resultant moderate left foraminal stenosis at T5-6, with more mild narrowing at the remaining levels again noted. Mild right foraminal narrowing at T6-7 due to facet arthropathy. MRI LUMBAR SPINE FINDINGS Segmentation: Transitional lumbosacral anatomy with a lumbarized S1 segment. Vertebral body count is made from the dens, with same numbering system utilized as on previous exams. Alignment: 3-4 mm retrolisthesis of L2 on L3, stable. Alignment otherwise normal with preservation of the normal lumbar lordosis. Vertebrae: Extensive susceptibility artifact related to prior fusion at T11 through the sacrum. Interbody devices in place at L3-4, L4-5, and L5-S1. Hardware better evaluated on prior CT. Vertebral body height maintained without acute fracture. Underlying bone marrow signal intensity within normal limits. No worrisome osseous lesions. Heterogeneous signal abnormality about the L2-3 interspace felt to be most consistent with reactive discogenic change. No other signal abnormality. No findings to suggest osteomyelitis discitis or septic arthritis. Conus medullaris: Extends to the L1-2 level and appears normal. No epidural collections. Paraspinal and other soft tissues: Extensive postoperative changes present throughout the lower posterior paraspinous musculature and soft tissues. Small collection at the right laminectomy site at L2-3 measures 2.5 x 2.4 cm (series 28, image 5). No significant regional mass effect or surrounding edema. Finding favored to reflect a benign postoperative seroma. No other discrete or loculated collections. Moderate distension of  the partially visualized urinary bladder noted. Retroaortic left renal vein noted. Disc levels: L1-2: Seen only on sagittal projection. Minimal annular disc  bulge. Prior posterior fusion. No stenosis. L2-3: Retrolisthesis. Advanced degenerative intervertebral disc space narrowing with disc desiccation, diffuse disc bulge, and reactive endplate change, worse on the right. Prior posterior fusion with right laminectomy. No significant residual spinal stenosis. Foramina appear patent. L3-4: Prior posterior and interbody fusion. Bilateral facet hypertrophy. No significant residual spinal stenosis. Foramina appear patent. L4-5: Prior posterior and interbody fusion with posterior decompression. No residual spinal stenosis. Foramina appear patent. L5-S1: Prior posterior and interbody fusion with posterior decompression. No residual canal or foraminal stenosis. S1-2: Negative interspace. Prior posterior fusion. No residual spinal stenosis. Foramina appear patent. IMPRESSION: 1. Postoperative changes from prior fusion at T11 through the sacrum. Extensive postoperative changes throughout the posterior paraspinous soft tissues with superimposed small 5 x 8 x 21 mm collection along the midline incision at the level of T11. Finding favored to reflect a small postoperative seroma, although superimposed infection not excluded, and could be considered in the correct clinical setting. 2. Additional 2.5 x 2.4 cm collection along the right laminectomy site at L2-3, also favored to reflect a postoperative seroma. No significant mass effect. 3. No other evidence for acute infection within the thoracic or lumbar spine. 4. Extensive postoperative changes from prior fusion at T11 through the sacrum. No residual or recurrent spinal stenosis. Electronically Signed   By: Jeannine Boga M.D.   On: 10/22/2020 03:16     Scheduled Meds: . atorvastatin  40 mg Oral QHS  . celecoxib  200 mg Oral BID  . enoxaparin (LOVENOX) injection  40  mg Subcutaneous Q24H  . insulin aspart  0-15 Units Subcutaneous TID WC  . insulin aspart  0-5 Units Subcutaneous QHS  . pantoprazole  40 mg Oral Daily  . tamsulosin  0.4 mg Oral Daily   Continuous Infusions: . ceFEPime (MAXIPIME) IV 2 g (10/23/20 0823)  . vancomycin 1,250 mg (10/23/20 0534)     LOS: 1 day     Enzo Bi, MD [facility] If 7PM-7AM, please contact night-coverage 10/23/2020, 2:47 PM

## 2020-10-23 NOTE — Plan of Care (Signed)

## 2020-10-23 NOTE — Anesthesia Postprocedure Evaluation (Signed)
Anesthesia Post Note  Patient: Howard Murphy  Procedure(s) Performed: INCISION AND DRAINAGE of POSTERIOR LUMBAR (N/A Back)  Patient location during evaluation: PACU Anesthesia Type: General Level of consciousness: awake and alert Pain management: pain level controlled Vital Signs Assessment: post-procedure vital signs reviewed and stable Respiratory status: spontaneous breathing, nonlabored ventilation, respiratory function stable and patient connected to nasal cannula oxygen Cardiovascular status: blood pressure returned to baseline and stable Postop Assessment: no apparent nausea or vomiting Anesthetic complications: no   No complications documented.   Last Vitals:  Vitals:   10/23/20 0403 10/23/20 0749  BP: (!) 106/56 114/80  Pulse: 83 78  Resp: 16 14  Temp: 36.8 C 37.1 C  SpO2: 94% 97%    Last Pain:  Vitals:   10/23/20 1443  TempSrc:   PainSc: 3                  Molli Barrows

## 2020-10-24 ENCOUNTER — Telehealth: Payer: Self-pay | Admitting: Pain Medicine

## 2020-10-24 ENCOUNTER — Encounter: Payer: Self-pay | Admitting: Neurosurgery

## 2020-10-24 LAB — CBC
HCT: 38.2 % — ABNORMAL LOW (ref 39.0–52.0)
Hemoglobin: 12.5 g/dL — ABNORMAL LOW (ref 13.0–17.0)
MCH: 28.4 pg (ref 26.0–34.0)
MCHC: 32.7 g/dL (ref 30.0–36.0)
MCV: 86.8 fL (ref 80.0–100.0)
Platelets: 212 10*3/uL (ref 150–400)
RBC: 4.4 MIL/uL (ref 4.22–5.81)
RDW: 13.4 % (ref 11.5–15.5)
WBC: 12.5 10*3/uL — ABNORMAL HIGH (ref 4.0–10.5)
nRBC: 0 % (ref 0.0–0.2)

## 2020-10-24 LAB — BASIC METABOLIC PANEL
Anion gap: 8 (ref 5–15)
BUN: 18 mg/dL (ref 8–23)
CO2: 25 mmol/L (ref 22–32)
Calcium: 8.5 mg/dL — ABNORMAL LOW (ref 8.9–10.3)
Chloride: 104 mmol/L (ref 98–111)
Creatinine, Ser: 0.72 mg/dL (ref 0.61–1.24)
GFR, Estimated: >60 mL/min (ref >60)
Glucose, Bld: 200 mg/dL — ABNORMAL HIGH (ref 70–99)
Potassium: 3.2 mmol/L — ABNORMAL LOW (ref 3.5–5.1)
Sodium: 137 mmol/L (ref 135–145)

## 2020-10-24 LAB — MAGNESIUM: Magnesium: 1.8 mg/dL (ref 1.7–2.4)

## 2020-10-24 LAB — GLUCOSE, CAPILLARY
Glucose-Capillary: 105 mg/dL — ABNORMAL HIGH (ref 70–99)
Glucose-Capillary: 142 mg/dL — ABNORMAL HIGH (ref 70–99)
Glucose-Capillary: 143 mg/dL — ABNORMAL HIGH (ref 70–99)
Glucose-Capillary: 130 mg/dL — ABNORMAL HIGH (ref 70–99)

## 2020-10-24 LAB — VANCOMYCIN, PEAK: Vancomycin Pk: 44 ug/mL — ABNORMAL HIGH (ref 30–40)

## 2020-10-24 MED ORDER — POTASSIUM CHLORIDE CRYS ER 20 MEQ PO TBCR
40.0000 meq | EXTENDED_RELEASE_TABLET | Freq: Once | ORAL | Status: AC
  Administered 2020-10-24: 40 meq via ORAL
  Filled 2020-10-24: qty 2

## 2020-10-24 MED ORDER — OXYCODONE-ACETAMINOPHEN 5-325 MG PO TABS
2.0000 | ORAL_TABLET | Freq: Four times a day (QID) | ORAL | Status: DC | PRN
Start: 2020-10-24 — End: 2020-10-27
  Administered 2020-10-24 – 2020-10-27 (×10): 2 via ORAL
  Filled 2020-10-24 (×12): qty 2

## 2020-10-24 MED ORDER — POLYETHYLENE GLYCOL 3350 17 G PO PACK
34.0000 g | PACK | Freq: Two times a day (BID) | ORAL | Status: DC
  Administered 2020-10-25: 34 g via ORAL
  Filled 2020-10-24 (×4): qty 2

## 2020-10-24 NOTE — Progress Notes (Addendum)
PROGRESS NOTE    [name]  TOI:[id number] DOB: 1956-05-09 DOA: 10/21/2020 PCP: [facility]  [id number]   Assessment & Plan:   Principal Problem:   Wound infection after surgery Active Problems:   GERD (gastroesophageal reflux disease)   Hyperlipidemia   Type 2 diabetes mellitus ([place])   Benign prostatic hyperplasia   Low back pain   Howard Murphy is a 65 y.o. male with medical history significant for lumbar spinal stenosis status post recent open T10 -pelvis posterior spinal fusion and L1-L2 posterior spinal decompression, hyperlipidemia, type 2 diabetes mellitus, who is admitted to [facility] on 10/21/2020 with concern for thoracic/lumbar wound infection of midline incisional surgical site after presenting from home to [facility] ED complaining of incisional surgical site erythema.   In the setting of lumbar spinal stenosis, the patient underwent open T10 -pelvis posterior spinal fusion and L1-L2 posterior spinal decompression on 09/14/2020 with Dr. Izora Ribas of neurosurgery.  The patient reports that this occurred approximately 2 years after preceding lumbar spine surgery, also performed by Dr. Izora Ribas.  Patient reports approximately 2 weeks of progressive erythema, tenderness, swelling associated with thoracic/lumbar midline incisional surgical site.  In the interval, the patient followed up in clinic with Dr. Izora Ribas on 10/13/2020, at which time he conveys that he was not experiencing any significant drainage from the site.  Conservative measures were elected, however, in the interval since this outpatient follow-up, the patient reports further progression of the incisional site erythema, swelling, tenderness as well as interval development of drainage, that has been intermittently purulent in nature.  The patient denies any new numbness or paresthesias involving the lower extremities, and denies any saddle anesthesia, new onset urinary retention  or fecal incontinence.  Denies any associated subjective fever, chills, rigors, or generalized myalgias.  The setting of the above, the patient contacted Dr Izora Ribas on 10/21/2020, with ensuing instructions to present to [facility] ED for further evaluation and management of suspected incisional surgical site infection.    # thoracic/lumbar wound infection of midline incisional surgical site:  Following lumbar spinal stenosis status post recent open T10 -pelvis posterior spinal fusion and L1-L2 posterior spinal decompression performed on 09/14/2020, patient presents with approximately 2 weeks of progressive incisional surgical site erythema, swelling, tenderness, with more recent development of purulent drainage.  --OR I/D on 5/14 with Dr. Izora Ribas Plan: --cont empiric vanc/cefepime --ID to see pt today --f/u OR culture --Monitor drain output --cont current pain regimen (d/c IV dilaudid).  # DM2, non-insulin-dependent, well controlled --Hold home Januvia and Empagliflozin while inpatient --SSI for now  # HLD --cont home statin  # GERD --cont home PPI  # BPH --cont home Flomax  # Hypokalemia --Monitor and replete PRN   DVT prophylaxis: Lovenox SQ Code Status: Full code  Family Communication:  Level of care: Med-Surg Dispo:   The patient is from: home Anticipated d/c is to: home Anticipated d/c date is: 2-3 days Patient currently is not medically ready to d/c due to: pending ID consult and neurosurgery management of the drains.   Subjective and Interval History:  Pt reported after a drain was removed, had had some bleeding which has since stopped.  Has required a lot of pain meds.   Objective: Vitals:   10/24/20 0458 10/24/20 0743 10/24/20 1148 10/24/20 1608  BP: 126/79 119/71 130/84 120/74  Pulse: 78 79 87 86  Resp: 16 18 18 20   Temp: 98.5 F (36.9 C) 98.1 F (36.7 C) 98.9 F (37.2 C)  97.8 F (36.6 C)  TempSrc:      SpO2: 95% 98% 96% 97%  Weight:      Height:         Intake/Output Summary (Last 24 hours) at 10/24/2020 1700 Last data filed at 10/24/2020 1407 Gross per 24 hour  Intake 360 ml  Output 1135 ml  Net -775 ml   Filed Weights   10/21/20 2117 10/23/20 0500  Weight: 83.5 kg 81.5 kg    Examination:   Constitutional: NAD, AAOx3 HEENT: conjunctivae and lids normal, EOMI CV: No cyanosis.   RESP: normal respiratory effort, on RA Extremities: No effusions, edema in BLE SKIN: warm, dry MSK: drain outputting mostly sanguinous fluids Neuro: II - XII grossly intact.   Psych: Normal mood and affect.  Appropriate judgement and reason    Data Reviewed: I have personally reviewed following labs and imaging studies  CBC: Recent Labs  Lab 10/21/20 2122 10/22/20 0455 10/23/20 0456 10/24/20 0512  WBC 8.6 9.3 16.5* 12.5*  NEUTROABS 4.5 5.3  --   --   HGB 13.9 13.9 13.7 12.5*  HCT 43.1 42.3 41.8 38.2*  MCV 88.3 87.2 86.5 86.8  PLT 215 211 231 034   Basic Metabolic Panel: Recent Labs  Lab 10/21/20 2122 10/22/20 0455 10/23/20 0456 10/24/20 0512  NA 136 137 137 137  K 3.6 3.3* 3.9 3.2*  CL 99 99 103 104  CO2 28 30 24 25   GLUCOSE 176* 127* 185* 200*  BUN 16 12 20 18   CREATININE 0.98 0.80 0.71 0.72  CALCIUM 8.9 8.8* 8.9 8.5*  MG  --  1.6* 1.9 1.8   GFR: Estimated Creatinine Clearance: 94.1 mL/min (by C-G formula based on SCr of 0.72 mg/dL). Liver Function Tests: No results for input(s): AST, ALT, ALKPHOS, BILITOT, PROT, ALBUMIN in the last 168 hours. No results for input(s): LIPASE, AMYLASE in the last 168 hours. No results for input(s): AMMONIA in the last 168 hours. Coagulation Profile: No results for input(s): INR, PROTIME in the last 168 hours. Cardiac Enzymes: No results for input(s): CKTOTAL, CKMB, CKMBINDEX, TROPONINI in the last 168 hours. BNP (last 3 results) No results for input(s): PROBNP in the last 8760 hours. HbA1C: No results for input(s): HGBA1C in the last 72 hours. CBG: Recent Labs  Lab 10/23/20 1621  10/23/20 2120 10/24/20 0745 10/24/20 1203 10/24/20 1610  GLUCAP 190* 147* 105* 142* 143*   Lipid Profile: No results for input(s): CHOL, HDL, LDLCALC, TRIG, CHOLHDL, LDLDIRECT in the last 72 hours. Thyroid Function Tests: No results for input(s): TSH, T4TOTAL, FREET4, T3FREE, THYROIDAB in the last 72 hours. Anemia Panel: No results for input(s): VITAMINB12, FOLATE, FERRITIN, TIBC, IRON, RETICCTPCT in the last 72 hours. Sepsis Labs: No results for input(s): PROCALCITON, LATICACIDVEN in the last 168 hours.  Recent Results (from the past 240 hour(s))  Blood culture (routine x 2)     Status: None (Preliminary result)   Collection Time: 10/22/20 12:20 AM   Specimen: BLOOD  Result Value Ref Range Status   Specimen Description BLOOD BLOOD RIGHT FOREARM  Final   Special Requests   Final    BOTTLES DRAWN AEROBIC AND ANAEROBIC Blood Culture results may not be optimal due to an inadequate volume of blood received in culture bottles   Culture   Final    NO GROWTH 2 DAYS Performed at [facility], [address], [place], [place] [address]    Report Status PENDING  Incomplete  Blood culture (routine x 2)  Status: None (Preliminary result)   Collection Time: 10/22/20 12:20 AM   Specimen: BLOOD  Result Value Ref Range Status   Specimen Description BLOOD RIGHT ANTECUBITAL  Final   Special Requests   Final    BOTTLES DRAWN AEROBIC AND ANAEROBIC Blood Culture results may not be optimal due to an inadequate volume of blood received in culture bottles   Culture   Final    NO GROWTH 2 DAYS Performed at [facility], [address] [place], [place] [address]    Report Status PENDING  Incomplete  Resp Panel by RT-PCR (Flu A&B, Covid) Nasopharyngeal Swab     Status: None   Collection Time: 10/22/20 12:20 AM   Specimen: Nasopharyngeal Swab; Nasopharyngeal(NP) swabs in vial transport medium  Result Value Ref Range Status   SARS Coronavirus 2 by RT PCR NEGATIVE NEGATIVE  Final    Comment: (NOTE) SARS-CoV-2 target nucleic acids are NOT DETECTED.  The SARS-CoV-2 RNA is generally detectable in upper respiratory specimens during the acute phase of infection. The lowest concentration of SARS-CoV-2 viral copies this assay can detect is 138 copies/mL. A negative result does not preclude SARS-Cov-2 infection and should not be used as the sole basis for treatment or other patient management decisions. A negative result may occur with  improper specimen collection/handling, submission of specimen other than nasopharyngeal swab, presence of viral mutation(s) within the areas targeted by this assay, and inadequate number of viral copies(<138 copies/mL). A negative result must be combined with clinical observations, patient history, and epidemiological information. The expected result is Negative.  Fact Sheet for Patients:  EntrepreneurPulse.com.au  Fact Sheet for Healthcare Providers:  IncredibleEmployment.be  This test is no t yet approved or cleared by the [place] FDA and  has been authorized for detection and/or diagnosis of SARS-CoV-2 by FDA under an Emergency Use Authorization (EUA). This EUA will remain  in effect (meaning this test can be used) for the duration of the COVID-19 declaration under Section 564(b)(1) of the Act, 21 U.S.C.section 360bbb-3(b)(1), unless the authorization is terminated  or revoked sooner.       Influenza A by PCR NEGATIVE NEGATIVE Final   Influenza B by PCR NEGATIVE NEGATIVE Final    Comment: (NOTE) The Xpert Xpress SARS-CoV-2/FLU/RSV plus assay is intended as an aid in the diagnosis of influenza from Nasopharyngeal swab specimens and should not be used as a sole basis for treatment. Nasal washings and aspirates are unacceptable for Xpert Xpress SARS-CoV-2/FLU/RSV testing.  Fact Sheet for Patients: EntrepreneurPulse.com.au  Fact Sheet for Healthcare  Providers: IncredibleEmployment.be  This test is not yet approved or cleared by the [place] FDA and has been authorized for detection and/or diagnosis of SARS-CoV-2 by FDA under an Emergency Use Authorization (EUA). This EUA will remain in effect (meaning this test can be used) for the duration of the COVID-19 declaration under Section 564(b)(1) of the Act, 21 U.S.C. section 360bbb-3(b)(1), unless the authorization is terminated or revoked.  Performed at [facility], [place], [place] [address]   Aerobic/Anaerobic Culture w Gram Stain (surgical/deep wound)     Status: None (Preliminary result)   Collection Time: 10/22/20 11:46 AM   Specimen: PATH Disc; Tissue  Result Value Ref Range Status   Specimen Description   Final    WOUND Performed at [facility], [address] [place], [place] [address]    Special Requests LUMBAR SUBFASCIAL FLUID ID A  Final   Gram Stain NO WBC SEEN NO ORGANISMS  SEEN   Final   Culture   Final    NO GROWTH 2 DAYS Performed at [facility], [place] [address] [place], [place] [address]    Report Status PENDING  Incomplete  Aerobic/Anaerobic Culture w Gram Stain (surgical/deep wound)     Status: None (Preliminary result)   Collection Time: 10/22/20 11:48 AM   Specimen: PATH Disc; Tissue  Result Value Ref Range Status   Specimen Description   Final    WOUND Performed at [facility], [address] [place], [place] [address]    Special Requests LUMBAR EPIFASCIAL FLUID ID B  Final   Gram Stain NO WBC SEEN NO ORGANISMS SEEN   Final   Culture   Final    NO GROWTH 2 DAYS Performed at [facility], [place] [address], [place], [place] [address]    Report Status PENDING  Incomplete      Radiology Studies: No results found.   Scheduled Meds: . atorvastatin  40 mg Oral QHS  . celecoxib  200 mg Oral BID  . enoxaparin (LOVENOX) injection  40 mg Subcutaneous Q24H  .  insulin aspart  0-15 Units Subcutaneous TID WC  . insulin aspart  0-5 Units Subcutaneous QHS  . pantoprazole  40 mg Oral Daily  . tamsulosin  0.4 mg Oral Daily   Continuous Infusions: . ceFEPime (MAXIPIME) IV 2 g (10/24/20 0835)  . vancomycin 1,250 mg (10/24/20 0529)     LOS: 2 days     Enzo Bi, MD [facility] If 7PM-7AM, please contact night-coverage 10/24/2020, 5:00 PM

## 2020-10-24 NOTE — Progress Notes (Signed)
    Attending Progress Note 09/14/20 - Extension of Fusion to T10, L1-2 decompression 10/22/20 - washout and reclosure  History: Howard Murphy is here for possible wound infection after prior lumbar spine surgery.  POD2: Patient with back pain today. Drains with minimal output (Drain #2 none). Cultures no growth to date. On broad specturm antibiotics  POD1: Feeling better than yesterday.  Pain control adequate.  Physical Exam: Vitals:   10/24/20 0458 10/24/20 0743  BP: 126/79 119/71  Pulse: 78 79  Resp: 16 18  Temp: 98.5 F (36.9 C) 98.1 F (36.7 C)  SpO2: 95% 98%    Awake, Alert Strength:5/5 throughout BLE Sensation intact throughout  Wound vac to suction Drain#1 - 65 Drain #2 - 0 Drain #3 - 10   Data:  Recent Labs  Lab 10/22/20 0455 10/23/20 0456 10/24/20 0512  NA 137 137 137  K 3.3* 3.9 3.2*  CL 99 103 104  CO2 30 24 25   BUN 12 20 18   CREATININE 0.80 0.71 0.72  GLUCOSE 127* 185* 200*  CALCIUM 8.8* 8.9 8.5*   No results for input(s): AST, ALT, ALKPHOS in the last 168 hours.  Invalid input(s): TBILI   Recent Labs  Lab 10/22/20 0455 10/23/20 0456 10/24/20 0512  WBC 9.3 16.5* 12.5*  HGB 13.9 13.7 12.5*  HCT 42.3 41.8 38.2*  PLT 211 231 212   No results for input(s): APTT, INR in the last 168 hours.       Other tests/results: Cultures NGTD  Assessment/Plan:  Howard Murphy is stable after washout and reclosure  - mobilize - pain control - DVT prophylaxis started - PTOT - Await culture results - appreciate ID consultation - continue abx pending culture results. - Continue drains #1, 3 - Drain #2 removed   Deetta Perla, MD Department of Neurosurgery

## 2020-10-24 NOTE — Telephone Encounter (Signed)
Patient had to have surgery again on his back, which got infected and he had to go back in for emergency surgery. Would like to keep his meds at the level he was on until he gets in to talk with Dr. Dossie Arbour. He is currenty still in [place]

## 2020-10-24 NOTE — Consult Note (Signed)
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 postoperative changes within the posterior paraspinous soft tissues without visible collection by CT. 2. 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 3. Aortic Atherosclerosis (ICD10-I70.0). Electronically Signed   By: Jeannine Boga M.D.   On: 10/22/2020 01:09   CT THORACIC SPINE W CONTRAST  Result Date: 09/26/2020 CLINICAL DATA:  Syncope, swelling in the back, and pain with leukocytosis. Recent thoracolumbar fusion. EXAM: CT LUMBAR SPINE WITH CONTRAST; CT  THORACIC SPINE WITH CONTRAST TECHNIQUE: Multidetector CT images of thoracic and lumbar spine was performed according to the standard protocol following intravenous contrast administration. CONTRAST:  158m OMNIPAQUE IOHEXOL 300 MG/ML  SOLN COMPARISON:  Thoracic spine MRI 04/19/2020. Lumbar spine CT 04/19/2020. Lumbar spine MRI 12/23/2019. FINDINGS: CT THORACIC SPINE FINDINGS Alignment: Trace retrolisthesis of T11 on T12. Vertebrae: No acute fracture or suspicious osseous lesion. Unchanged mild chronic anterior wedging of the T11 vertebral body. Interval extension of the patient's posterior lumbar fusion superiorly to the T11 level. Bilateral pedicle screws at T11 and T12 appear well-positioned without evidence of loosening. Paraspinal and other soft tissues: Postoperative changes in the posterior lower thoracic soft tissues with bone graft material bilaterally and minimal gas. No unexpected fluid collection. Disc levels: Prominent anterior vertebral osteophytes from T7 to L1. No evidence of significant spinal stenosis on this non myelographic examination. Asymmetrically severe facet arthrosis on the left from T5-6 to T8-9 resulting in moderate left neural foraminal stenosis at T5-6 and mild stenosis at the other levels. Mild right neural foraminal stenosis at T6-7 due to facet arthrosis. CT LUMBAR SPINE FINDINGS Segmentation: There is transitional lumbosacral anatomy. Based on the concurrent thoracic spine CT, the transitional segment is a partially lumbarized S1. Note that this numbering differs from that used on the prior lumbar spine CT and lumbar spine MRI from 2021 which did not include concurrent thoracic spine imaging. Alignment: Unchanged minimal retrolisthesis of L2 on L3. Vertebrae: Interval superior extension of the patient's prior posterior lumbar fusion. New pedicle screws are in place bilaterally at L1 and L2 and appear well-positioned without evidence of loosening. Pedicle screws remain in place  bilaterally from L3-S2 except at the L5 level where the right-sided screw had been previously removed. Bilateral iliac screws remain in place traversing the SI joints. Interbody implants remain in place at L3-4, L4-5, and L5-S1 with evidence of solid interbody fusion at each level. No acute fracture  or suspicious osseous lesion. Paraspinal and other soft tissues: Postoperative changes throughout the posterior lumbar soft tissues with new bone graft material in place in the upper lumbar spine. No unexpected fluid collection. Abdominal aortic atherosclerosis. Disc levels: L1-2: Interval posterior fusion. Unchanged spondylosis without evidence of significant stenosis. L2-3: Unchanged advanced disc degeneration asymmetrically worse on the right with severe disc space narrowing, prominent degenerative sclerosis, and vacuum disc. Interval right laminectomy. Unchanged mild bilateral neural foraminal stenosis due to spurring. L3-4 through S1-2: Previous fusion with similar appearance to the prior CT. No evidence of significant stenosis. IMPRESSION: 1. Transitional lumbosacral anatomy. Based on the current thoracic spine CT, the transitional segment is referred to as a partially lumbarized S1 which differs from the numbering used on the prior lumbar spine CT and MRI. 2. Interval extension of the patient's posterior fusion superiorly to the T11 level. Expected postoperative changes in the posterior spinal soft tissues without evidence of an abscess. 3. Aortic Atherosclerosis (ICD10-I70.0). Electronically Signed   By: Logan Bores M.D.   On: 09/26/2020 11:01   CT Lumbar Spine Wo Contrast  Result Date: 10/22/2020 CLINICAL DATA:  Initial evaluation for low back pain, infection or inflammation suspected. EXAM: CT THORACIC AND LUMBAR SPINE WITHOUT CONTRAST TECHNIQUE: Multidetector CT imaging of the thoracic and lumbar spine was performed without contrast. Multiplanar CT image reconstructions were also generated. COMPARISON:   Prior study from 09/26/2020. FINDINGS: CT THORACIC SPINE FINDINGS Alignment: Trace retrolisthesis of T11 on T12, stable. Alignment otherwise normal. Vertebrae: No acute or interval fracture. No discrete or worrisome osseous lesions. Mild chronic anterior wedging of the T11 vertebral body, stable. Posterior fusion extending from T11 through the sacrum. Hardware remains well positioned without evidence for loosening or other complication. Paraspinal and other soft tissues: Mild postoperative stranding present within the posterior paraspinous soft tissues extending from T8-9 inferiorly. No discrete collections evident by CT. Remainder of the paraspinous soft tissues otherwise unremarkable. Partially visualized lungs are clear. Disc levels: Anterior bridging osteophytes extending from T7 through L1. No new or progressive spinal stenosis. Left-sided facet arthrosis at T5-6-T8-9 with resultant moderate left foraminal stenosis at T5-6, with more mild narrowing at the remaining levels. Mild right foraminal narrowing at T6-7 due to facet arthrosis. Appearance is stable. CT LUMBAR SPINE FINDINGS Segmentation: Transitional lumbosacral anatomy with a lumbarized S1 segment. Same numbering system employed as on previous exam. Alignment: Mild retrolisthesis of L2 on L3, stable. Alignment otherwise normal. Vertebrae: Posterior fusion extending from T11 through the sacrum. Hardware in stable position and is well aligned without loosening or other complication. Sequelae of prior screw removal on the right again noted at L5. Bilateral iliac screws remain in place traversing both SI joints. Interbody implants in place at L3-4, L4-5, and L5-S1, stable in position. Vertebral body height maintained without fracture. No discrete or worrisome osseous lesions. Paraspinal and other soft tissues: Postoperative changes within the posterior paraspinous soft tissues with expected postoperative stranding. No visible collections evident by CT.  Aortic atherosclerosis noted. Disc levels: L1-2: Posterior fusion. Residual disc bulge with facet hypertrophy without significant stenosis. L2-3: Advanced degenerative intervertebral disc space narrowing with disc bulge, disc desiccation, and reactive endplate change, worse on the right. Associated degenerative sclerosis. Prior right laminectomy. Residual bilateral facet arthrosis. No spinal stenosis. Mild bilateral neural foraminal stenosis due to spurring, stable. L3-4 through S1-2. Previous fusion with similar appearance to previous exam. No significant residual stenosis. IMPRESSION: 1. Postoperative changes from prior posterior fusion from T11 through the sacrum, stable from previous  without complication. Similar postoperative changes within the posterior paraspinous soft tissues without visible collection by CT. 2. 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 3. Aortic Atherosclerosis (ICD10-I70.0). Electronically Signed   By: Jeannine Boga M.D.   On: 10/22/2020 01:09   CT LUMBAR SPINE W CONTRAST  Result Date: 09/26/2020 CLINICAL DATA:  Syncope, swelling in the back, and pain with leukocytosis. Recent thoracolumbar fusion. EXAM: CT LUMBAR SPINE WITH CONTRAST; CT THORACIC SPINE WITH CONTRAST TECHNIQUE: Multidetector CT images of thoracic and lumbar spine was performed according to the standard protocol following intravenous contrast administration. CONTRAST:  152m OMNIPAQUE IOHEXOL 300 MG/ML  SOLN COMPARISON:  Thoracic spine MRI 04/19/2020. Lumbar spine CT 04/19/2020. Lumbar spine MRI 12/23/2019. FINDINGS: CT THORACIC SPINE FINDINGS Alignment: Trace retrolisthesis of T11 on T12. Vertebrae: No acute fracture or suspicious osseous lesion. Unchanged mild chronic anterior wedging of the T11 vertebral body. Interval extension of the patient's posterior lumbar fusion superiorly to the T11 level. Bilateral pedicle screws at T11 and T12 appear  well-positioned without evidence of loosening. Paraspinal and other soft tissues: Postoperative changes in the posterior lower thoracic soft tissues with bone graft material bilaterally and minimal gas. No unexpected fluid collection. Disc levels: Prominent anterior vertebral osteophytes from T7 to L1. No evidence of significant spinal stenosis on this non myelographic examination. Asymmetrically severe facet arthrosis on the left from T5-6 to T8-9 resulting in moderate left neural foraminal stenosis at T5-6 and mild stenosis at the other levels. Mild right neural foraminal stenosis at T6-7 due to facet arthrosis. CT LUMBAR SPINE FINDINGS Segmentation: There is transitional lumbosacral anatomy. Based on the concurrent thoracic spine CT, the transitional segment is a partially lumbarized S1. Note that this numbering differs from that used on the prior lumbar spine CT and lumbar spine MRI from 2021 which did not include concurrent thoracic spine imaging. Alignment: Unchanged minimal retrolisthesis of L2 on L3. Vertebrae: Interval superior extension of the patient's prior posterior lumbar fusion. New pedicle screws are in place bilaterally at L1 and L2 and appear well-positioned without evidence of loosening. Pedicle screws remain in place bilaterally from L3-S2 except at the L5 level where the right-sided screw had been previously removed. Bilateral iliac screws remain in place traversing the SI joints. Interbody implants remain in place at L3-4, L4-5, and L5-S1 with evidence of solid interbody fusion at each level. No acute fracture or suspicious osseous lesion. Paraspinal and other soft tissues: Postoperative changes throughout the posterior lumbar soft tissues with new bone graft material in place in the upper lumbar spine. No unexpected fluid collection. Abdominal aortic atherosclerosis. Disc levels: L1-2: Interval posterior fusion. Unchanged spondylosis without evidence of significant stenosis. L2-3: Unchanged  advanced disc degeneration asymmetrically worse on the right with severe disc space narrowing, prominent degenerative sclerosis, and vacuum disc. Interval right laminectomy. Unchanged mild bilateral neural foraminal stenosis due to spurring. L3-4 through S1-2: Previous fusion with similar appearance to the prior CT. No evidence of significant stenosis. IMPRESSION: 1. Transitional lumbosacral anatomy. Based on the current thoracic spine CT, the transitional segment is referred to as a partially lumbarized S1 which differs from the numbering used on the prior lumbar spine CT and MRI. 2. Interval extension of the patient's posterior fusion superiorly to the T11 level. Expected postoperative changes in the posterior spinal soft tissues without evidence of an abscess. 3. Aortic Atherosclerosis (ICD10-I70.0). Electronically Signed   By: ALogan BoresM.D.   On: 09/26/2020 11:01   MR THORACIC SPINE W  WO CONTRAST  Result Date: 10/22/2020 CLINICAL DATA:  Initial evaluation for acute back pain, concern for midline wound infection, redness and drainage. EXAM: MRI THORACIC AND LUMBAR SPINE WITHOUT AND WITH CONTRAST TECHNIQUE: Multiplanar and multiecho pulse sequences of the thoracic and lumbar spine were obtained without and with intravenous contrast. CONTRAST:  7.66m GADAVIST GADOBUTROL 1 MMOL/ML IV SOLN COMPARISON:  Prior CT from earlier the same day as well as previous studies. FINDINGS: MRI THORACIC SPINE FINDINGS Alignment: Trace retrolisthesis of T11 on T12. Underlying trace dextroscoliosis. Vertebrae: Mild chronic wedging of T11 again noted, stable. Vertebral body height otherwise maintained without acute or subacute fracture. Susceptibility artifact related to prior posterior fusion extending from T11 through the sacrum. Hardware better evaluated on prior CT. Underlying bone marrow signal intensity within normal limits. No worrisome osseous lesions. No findings to suggest osteomyelitis discitis or septic arthritis.  Cord: Evaluation of the distal cord limited by adjacent susceptibility effect. Visualized portions are normal without signal abnormality. No epidural abscess or other collection. Paraspinal and other soft tissues: Postoperative changes from recent fusion present within the lower posterior paraspinous soft tissues. Small collection measuring 5 x 8 x 21 mm seen along the midline incision at the level of T11 (series 22, image 34). No significant subfascial extension. Finding favored to reflect a small postoperative seroma. Superimposed infection not excluded, and could be considered in the correct clinical setting. No other loculated collections. Visualized lungs are grossly clear. Visualized visceral structures unremarkable. Disc levels: C7-T1: Small central disc protrusion indents the ventral thecal sac without significant spinal stenosis (series 22, image 2). No other significant disc pathology seen within the thoracic spine. No spinal stenosis. She left-sided facet arthrosis at T5-6 through T8-9 with resultant moderate left foraminal stenosis at T5-6, with more mild narrowing at the remaining levels again noted. Mild right foraminal narrowing at T6-7 due to facet arthropathy. MRI LUMBAR SPINE FINDINGS Segmentation: Transitional lumbosacral anatomy with a lumbarized S1 segment. Vertebral body count is made from the dens, with same numbering system utilized as on previous exams. Alignment: 3-4 mm retrolisthesis of L2 on L3, stable. Alignment otherwise normal with preservation of the normal lumbar lordosis. Vertebrae: Extensive susceptibility artifact related to prior fusion at T11 through the sacrum. Interbody devices in place at L3-4, L4-5, and L5-S1. Hardware better evaluated on prior CT. Vertebral body height maintained without acute fracture. Underlying bone marrow signal intensity within normal limits. No worrisome osseous lesions. Heterogeneous signal abnormality about the L2-3 interspace felt to be most  consistent with reactive discogenic change. No other signal abnormality. No findings to suggest osteomyelitis discitis or septic arthritis. Conus medullaris: Extends to the L1-2 level and appears normal. No epidural collections. Paraspinal and other soft tissues: Extensive postoperative changes present throughout the lower posterior paraspinous musculature and soft tissues. Small collection at the right laminectomy site at L2-3 measures 2.5 x 2.4 cm (series 28, image 5). No significant regional mass effect or surrounding edema. Finding favored to reflect a benign postoperative seroma. No other discrete or loculated collections. Moderate distension of the partially visualized urinary bladder noted. Retroaortic left renal vein noted. Disc levels: L1-2: Seen only on sagittal projection. Minimal annular disc bulge. Prior posterior fusion. No stenosis. L2-3: Retrolisthesis. Advanced degenerative intervertebral disc space narrowing with disc desiccation, diffuse disc bulge, and reactive endplate change, worse on the right. Prior posterior fusion with right laminectomy. No significant residual spinal stenosis. Foramina appear patent. L3-4: Prior posterior and interbody fusion. Bilateral facet hypertrophy. No significant residual spinal stenosis.  Foramina appear patent. L4-5: Prior posterior and interbody fusion with posterior decompression. No residual spinal stenosis. Foramina appear patent. L5-S1: Prior posterior and interbody fusion with posterior decompression. No residual canal or foraminal stenosis. S1-2: Negative interspace. Prior posterior fusion. No residual spinal stenosis. Foramina appear patent. IMPRESSION: 1. Postoperative changes from prior fusion at T11 through the sacrum. Extensive postoperative changes throughout the posterior paraspinous soft tissues with superimposed small 5 x 8 x 21 mm collection along the midline incision at the level of T11. Finding favored to reflect a small postoperative seroma,  although superimposed infection not excluded, and could be considered in the correct clinical setting. 2. Additional 2.5 x 2.4 cm collection along the right laminectomy site at L2-3, also favored to reflect a postoperative seroma. No significant mass effect. 3. No other evidence for acute infection within the thoracic or lumbar spine. 4. Extensive postoperative changes from prior fusion at T11 through the sacrum. No residual or recurrent spinal stenosis. Electronically Signed   By: Jeannine Boga M.D.   On: 10/22/2020 03:16   MR Lumbar Spine W Wo Contrast  Result Date: 10/22/2020 CLINICAL DATA:  Initial evaluation for acute back pain, concern for midline wound infection, redness and drainage. EXAM: MRI THORACIC AND LUMBAR SPINE WITHOUT AND WITH CONTRAST TECHNIQUE: Multiplanar and multiecho pulse sequences of the thoracic and lumbar spine were obtained without and with intravenous contrast. CONTRAST:  7.84m GADAVIST GADOBUTROL 1 MMOL/ML IV SOLN COMPARISON:  Prior CT from earlier the same day as well as previous studies. FINDINGS: MRI THORACIC SPINE FINDINGS Alignment: Trace retrolisthesis of T11 on T12. Underlying trace dextroscoliosis. Vertebrae: Mild chronic wedging of T11 again noted, stable. Vertebral body height otherwise maintained without acute or subacute fracture. Susceptibility artifact related to prior posterior fusion extending from T11 through the sacrum. Hardware better evaluated on prior CT. Underlying bone marrow signal intensity within normal limits. No worrisome osseous lesions. No findings to suggest osteomyelitis discitis or septic arthritis. Cord: Evaluation of the distal cord limited by adjacent susceptibility effect. Visualized portions are normal without signal abnormality. No epidural abscess or other collection. Paraspinal and other soft tissues: Postoperative changes from recent fusion present within the lower posterior paraspinous soft tissues. Small collection measuring 5 x 8 x  21 mm seen along the midline incision at the level of T11 (series 22, image 34). No significant subfascial extension. Finding favored to reflect a small postoperative seroma. Superimposed infection not excluded, and could be considered in the correct clinical setting. No other loculated collections. Visualized lungs are grossly clear. Visualized visceral structures unremarkable. Disc levels: C7-T1: Small central disc protrusion indents the ventral thecal sac without significant spinal stenosis (series 22, image 2). No other significant disc pathology seen within the thoracic spine. No spinal stenosis. She left-sided facet arthrosis at T5-6 through T8-9 with resultant moderate left foraminal stenosis at T5-6, with more mild narrowing at the remaining levels again noted. Mild right foraminal narrowing at T6-7 due to facet arthropathy. MRI LUMBAR SPINE FINDINGS Segmentation: Transitional lumbosacral anatomy with a lumbarized S1 segment. Vertebral body count is made from the dens, with same numbering system utilized as on previous exams. Alignment: 3-4 mm retrolisthesis of L2 on L3, stable. Alignment otherwise normal with preservation of the normal lumbar lordosis. Vertebrae: Extensive susceptibility artifact related to prior fusion at T11 through the sacrum. Interbody devices in place at L3-4, L4-5, and L5-S1. Hardware better evaluated on prior CT. Vertebral body height maintained without acute fracture. Underlying bone marrow signal intensity within normal limits.  No worrisome osseous lesions. Heterogeneous signal abnormality about the L2-3 interspace felt to be most consistent with reactive discogenic change. No other signal abnormality. No findings to suggest osteomyelitis discitis or septic arthritis. Conus medullaris: Extends to the L1-2 level and appears normal. No epidural collections. Paraspinal and other soft tissues: Extensive postoperative changes present throughout the lower posterior paraspinous  musculature and soft tissues. Small collection at the right laminectomy site at L2-3 measures 2.5 x 2.4 cm (series 28, image 5). No significant regional mass effect or surrounding edema. Finding favored to reflect a benign postoperative seroma. No other discrete or loculated collections. Moderate distension of the partially visualized urinary bladder noted. Retroaortic left renal vein noted. Disc levels: L1-2: Seen only on sagittal projection. Minimal annular disc bulge. Prior posterior fusion. No stenosis. L2-3: Retrolisthesis. Advanced degenerative intervertebral disc space narrowing with disc desiccation, diffuse disc bulge, and reactive endplate change, worse on the right. Prior posterior fusion with right laminectomy. No significant residual spinal stenosis. Foramina appear patent. L3-4: Prior posterior and interbody fusion. Bilateral facet hypertrophy. No significant residual spinal stenosis. Foramina appear patent. L4-5: Prior posterior and interbody fusion with posterior decompression. No residual spinal stenosis. Foramina appear patent. L5-S1: Prior posterior and interbody fusion with posterior decompression. No residual canal or foraminal stenosis. S1-2: Negative interspace. Prior posterior fusion. No residual spinal stenosis. Foramina appear patent. IMPRESSION: 1. Postoperative changes from prior fusion at T11 through the sacrum. Extensive postoperative changes throughout the posterior paraspinous soft tissues with superimposed small 5 x 8 x 21 mm collection along the midline incision at the level of T11. Finding favored to reflect a small postoperative seroma, although superimposed infection not excluded, and could be considered in the correct clinical setting. 2. Additional 2.5 x 2.4 cm collection along the right laminectomy site at L2-3, also favored to reflect a postoperative seroma. No significant mass effect. 3. No other evidence for acute infection within the thoracic or lumbar spine. 4. Extensive  postoperative changes from prior fusion at T11 through the sacrum. No residual or recurrent spinal stenosis. Electronically Signed   By: Jeannine Boga M.D.   On: 10/22/2020 03:16   ECHOCARDIOGRAM COMPLETE  Result Date: 09/25/2020    ECHOCARDIOGRAM REPORT   Patient Name:   SILAS MUFF Date of Exam: 09/25/2020 Medical Rec #:  [id number]       Height:       67.0 in Accession #:    [id number]      Weight:       180.0 lb Date of Birth:  1955/09/19        BSA:          1.934 m Patient Age:    60 years        BP:           109/73 mmHg Patient Gender: M               HR:           75 bpm. Exam Location:  [facility] Procedure: 2D Echo and Strain Analysis Indications:     Syncope R55  History:         Patient has no prior history of Echocardiogram examinations.  Sonographer:     Bradley Referring Phys:  [id number] Willard Diagnosing Phys: Dorris Carnes MD IMPRESSIONS  1. Left ventricular ejection fraction, by estimation, is 60 to 65%. The left ventricle has normal function. The left ventricle has no regional wall motion abnormalities. Left ventricular diastolic  parameters were normal.  2. Right ventricular systolic function is normal. The right ventricular size is normal.  3. The mitral valve is normal in structure. Trivial mitral valve regurgitation.  4. The aortic valve is normal in structure. Aortic valve regurgitation is not visualized. FINDINGS  Left Ventricle: Left ventricular ejection fraction, by estimation, is 60 to 65%. The left ventricle has normal function. The left ventricle has no regional wall motion abnormalities. The left ventricular internal cavity size was normal in size. There is  no left ventricular hypertrophy. Left ventricular diastolic parameters were normal. Right Ventricle: The right ventricular size is normal. Right vetricular wall thickness was not assessed. Right ventricular systolic function is normal. Left Atrium: Left atrial size was normal in size. Right Atrium: Right atrial  size was normal in size. Pericardium: There is no evidence of pericardial effusion. Mitral Valve: The mitral valve is normal in structure. Trivial mitral valve regurgitation. Tricuspid Valve: The tricuspid valve is normal in structure. Tricuspid valve regurgitation is trivial. Aortic Valve: The aortic valve is normal in structure. Aortic valve regurgitation is not visualized. Aortic valve peak gradient measures 13.8 mmHg. Pulmonic Valve: The pulmonic valve was normal in structure. Pulmonic valve regurgitation is not visualized. Aorta: The aortic root and ascending aorta are structurally normal, with no evidence of dilitation. IAS/Shunts: The interatrial septum was not assessed.  LEFT VENTRICLE PLAX 2D LVIDd:         4.45 cm  Diastology LVIDs:         3.04 cm  LV e' medial:    9.14 cm/s LV PW:         0.97 cm  LV E/e' medial:  11.5 LV IVS:        1.26 cm  LV e' lateral:   11.90 cm/s LVOT diam:     1.80 cm  LV E/e' lateral: 8.8 LV SV:         62 LV SV Index:   32       2D Longitudinal Strain LVOT Area:     2.54 cm 2D Strain GLS Avg:     18.8 %  RIGHT VENTRICLE RV Basal diam:  2.80 cm RV S prime:     16.20 cm/s TAPSE (M-mode): 1.7 cm LEFT ATRIUM             Index LA diam:        3.80 cm 1.97 cm/m LA Vol (A2C):   56.2 ml 29.06 ml/m LA Vol (A4C):   38.6 ml 19.96 ml/m LA Biplane Vol: 50.2 ml 25.96 ml/m  AORTIC VALVE                PULMONIC VALVE AV Area (Vmax): 1.76 cm    PV Vmax:       1.21 m/s AV Vmax:        186.00 cm/s PV Peak grad:  5.9 mmHg AV Peak Grad:   13.8 mmHg LVOT Vmax:      129.00 cm/s LVOT Vmean:     80.500 cm/s LVOT VTI:       0.242 m  AORTA Ao Root diam: 2.80 cm Ao Asc diam:  3.00 cm MITRAL VALVE                TRICUSPID VALVE MV Area (PHT): 4.86 cm     TV Peak grad:   21.7 mmHg MV Decel Time: 156 msec     TV Vmax:        2.33 m/s MV E velocity: 105.00 cm/s  MV A velocity: 84.00 cm/s   SHUNTS MV E/A ratio:  1.25         Systemic VTI:  0.24 m                             Systemic Diam: 1.80 cm Dorris Carnes MD Electronically signed by Dorris Carnes MD Signature Date/Time: 09/25/2020/12:14:45 PM    Final     Assessment:   ARDIAN HABERLAND is a 65 y.o. male with recent (4/6)  spinal fusion and decompression in the thoracic and lumbar region admitted May 13 with some increasing redness and mild drainage from the site.  MRI showed possible small seromas although infection could not be ruled out.  White count was normal and sed rate and CRP were relatively stable.  He underwent a washout with no findings of pus.  Cultures are negative to date apparently were done prior to antibiotics.  Recommendations Continue current antibiotics. If cultures remain negative we can consider transitioning to treatment with oral antibiotics for postop skin and soft tissue infection without any deep involvement.  Thank you very much for allowing me to participate in the care of this patient. Please call with questions.   Cheral Marker. Ola Spurr, MD

## 2020-10-24 NOTE — Consult Note (Addendum)
Pharmacy Antibiotic Note  Howard Murphy is a 65 y.o. male admitted on 10/21/2020 with suspected wound infection 4.5 s/p orthopedic fusion surgery. Pharmacy has been consulted for Vancomycin dosing.  5/14 I&D of surgical wound. Vancomycin 2 gram cement beads inserted  Day 3 vancomycin and cefepime  Plan:  Vancomcyin 1750 mg LD x 1, followed by vancomycin maintenance 1250 mg Q12H. Goal AUC 400-550  Expected AUC 548  Scr used: 0.8 Vancomycin peak ordered for 1930 and vancomycin trough ordered for 0430 Patient is also ordered cefepime 2g IV q8h Monitor clinical picture and renal function  F/U C&S, abx deescalation / LOT    Height: 5\' 7"  (170.2 cm) Weight: 81.5 kg (179 lb 10.8 oz) IBW/kg (Calculated) : 66.1  Temp (24hrs), Avg:98.4 F (36.9 C), Min:97.8 F (36.6 C), Max:98.9 F (37.2 C)  Recent Labs  Lab 10/21/20 2122 10/22/20 0455 10/23/20 0456 10/24/20 0512  WBC 8.6 9.3 16.5* 12.5*  CREATININE 0.98 0.80 0.71 0.72    Estimated Creatinine Clearance: 94.1 mL/min (by C-G formula based on SCr of 0.72 mg/dL).    Allergies  Allergen Reactions  . Adhesive [Tape] Other (See Comments)    Honeycomb dressing, blisters    Antimicrobials this admission: 5/14 cefepime>>  5/14 vancomycin >>   Dose adjustments this admission: n/a  Microbiology results: 5/14: Wound culture: NGx2D 5/14 BCx: NGx2D  Thank you for allowing pharmacy to be a part of this patient's care.  Darnelle Bos, PharmD 10/24/2020 3:27 PM

## 2020-10-25 LAB — CBC
HCT: 37.4 % — ABNORMAL LOW (ref 39.0–52.0)
Hemoglobin: 12.3 g/dL — ABNORMAL LOW (ref 13.0–17.0)
MCH: 28.6 pg (ref 26.0–34.0)
MCHC: 32.9 g/dL (ref 30.0–36.0)
MCV: 87 fL (ref 80.0–100.0)
Platelets: 205 10*3/uL (ref 150–400)
RBC: 4.3 MIL/uL (ref 4.22–5.81)
RDW: 13.7 % (ref 11.5–15.5)
WBC: 9.6 10*3/uL (ref 4.0–10.5)
nRBC: 0 % (ref 0.0–0.2)

## 2020-10-25 LAB — GLUCOSE, CAPILLARY
Glucose-Capillary: 128 mg/dL — ABNORMAL HIGH (ref 70–99)
Glucose-Capillary: 131 mg/dL — ABNORMAL HIGH (ref 70–99)
Glucose-Capillary: 115 mg/dL — ABNORMAL HIGH (ref 70–99)
Glucose-Capillary: 169 mg/dL — ABNORMAL HIGH (ref 70–99)

## 2020-10-25 LAB — BASIC METABOLIC PANEL
Anion gap: 7 (ref 5–15)
BUN: 14 mg/dL (ref 8–23)
CO2: 25 mmol/L (ref 22–32)
Calcium: 8.5 mg/dL — ABNORMAL LOW (ref 8.9–10.3)
Chloride: 105 mmol/L (ref 98–111)
Creatinine, Ser: 0.67 mg/dL (ref 0.61–1.24)
GFR, Estimated: >60 mL/min (ref >60)
Glucose, Bld: 140 mg/dL — ABNORMAL HIGH (ref 70–99)
Potassium: 3.4 mmol/L — ABNORMAL LOW (ref 3.5–5.1)
Sodium: 137 mmol/L (ref 135–145)

## 2020-10-25 LAB — VANCOMYCIN, TROUGH: Vancomycin Tr: 12 ug/mL — ABNORMAL LOW (ref 15–20)

## 2020-10-25 LAB — MAGNESIUM: Magnesium: 1.8 mg/dL (ref 1.7–2.4)

## 2020-10-25 MED ORDER — VANCOMYCIN HCL 1250 MG/250ML IV SOLN
1250.0000 mg | Freq: Two times a day (BID) | INTRAVENOUS | Status: DC
  Administered 2020-10-25 – 2020-10-27 (×4): 1250 mg via INTRAVENOUS
  Filled 2020-10-25 (×6): qty 250

## 2020-10-25 MED ORDER — VANCOMYCIN HCL 750 MG/150ML IV SOLN
750.0000 mg | Freq: Three times a day (TID) | INTRAVENOUS | Status: DC
  Filled 2020-10-25 (×3): qty 150

## 2020-10-25 MED ORDER — POTASSIUM CHLORIDE CRYS ER 20 MEQ PO TBCR
40.0000 meq | EXTENDED_RELEASE_TABLET | ORAL | Status: AC
  Administered 2020-10-25 (×2): 40 meq via ORAL
  Filled 2020-10-25 (×2): qty 2

## 2020-10-25 NOTE — Care Management Important Message (Signed)
Important Message  Patient Details  Name: Howard Murphy MRN: [id number] Date of Birth: 1956-05-23   Medicare Important Message Given:  Yes     Juliann Pulse A Li Bobo 10/25/2020, 2:53 PM

## 2020-10-25 NOTE — TOC Progression Note (Signed)
Transition of Care [facility]) - Progression Note    Patient Details  Name: Howard Murphy MRN: [id number] Date of Birth: June 02, 1956  Transition of Care [facility]) CM/SW [place], RN Phone Number: 10/25/2020, 10:28 AM  Clinical Narrative:   Met with the patient in the room to discuss DC plan and needs He lives at home with his girlfriend and stated that she helps when needed, he has all the DME he needs and has no needs at this time         Expected Discharge Plan and Services                                                 Social Determinants of Health (SDOH) Interventions    Readmission Risk Interventions No flowsheet data found.

## 2020-10-25 NOTE — Progress Notes (Signed)
PROGRESS NOTE    ELIJIAH MICKLEY  HYQ:[id number] DOB: 10-02-1955 DOA: 10/21/2020 PCP: [facility]  [id number]-[id number]   Assessment & Plan:   Principal Problem:   Wound infection after surgery Active Problems:   GERD (gastroesophageal reflux disease)   Hyperlipidemia   Type 2 diabetes mellitus ([place])   Benign prostatic hyperplasia   Low back pain   QUASHAWN JEWKES is a 65 y.o. male with medical history significant for lumbar spinal stenosis status post recent open T10 -pelvis posterior spinal fusion and L1-L2 posterior spinal decompression, hyperlipidemia, type 2 diabetes mellitus, who is admitted to [facility] on 10/21/2020 with concern for thoracic/lumbar wound infection of midline incisional surgical site after presenting from home to [facility] ED complaining of incisional surgical site erythema.   In the setting of lumbar spinal stenosis, the patient underwent open T10 -pelvis posterior spinal fusion and L1-L2 posterior spinal decompression on 09/14/2020 with Dr. Izora Ribas of neurosurgery.  The patient reports that this occurred approximately 2 years after preceding lumbar spine surgery, also performed by Dr. Izora Ribas.  Patient reports approximately 2 weeks of progressive erythema, tenderness, swelling associated with thoracic/lumbar midline incisional surgical site.  In the interval, the patient followed up in clinic with Dr. Izora Ribas on 10/13/2020, at which time he conveys that he was not experiencing any significant drainage from the site.  Conservative measures were elected, however, in the interval since this outpatient follow-up, the patient reports further progression of the incisional site erythema, swelling, tenderness as well as interval development of drainage, that has been intermittently purulent in nature.  The patient denies any new numbness or paresthesias involving the lower extremities, and denies any saddle anesthesia, new onset urinary retention  or fecal incontinence.  Denies any associated subjective fever, chills, rigors, or generalized myalgias.  The setting of the above, the patient contacted Dr Izora Ribas on 10/21/2020, with ensuing instructions to present to [facility] ED for further evaluation and management of suspected incisional surgical site infection.    # thoracic/lumbar wound infection of midline incisional surgical site:  Following lumbar spinal stenosis status post recent open T10 -pelvis posterior spinal fusion and L1-L2 posterior spinal decompression performed on 09/14/2020, patient presents with approximately 2 weeks of progressive incisional surgical site erythema, swelling, tenderness, with more recent development of drainage.  --MRI showed possible small seromas although infectioncould not be ruled out. White count was normal and sed rate and CRP were relatively stable.  --OR I/D on 5/14 with Dr. Izora Ribas, with no findings of pus. Cultures are negative to date(were done prior to antibiotics).  (Query whether erythema and edema around original surgical site was due to allergic rxn to suture/sking closing material?) --empiric vanc/cefepime started only after OR cultures obtained. --ID consulted. Plan: --cont vanc/cefepime for now, per ID --drain management per neurosurgery.  --cont current pain regimen (IV dilaudid d/c'ed).  # DM2, non-insulin-dependent, well controlled --Hold home Januvia and Empagliflozin while inpatient --SSI for now  # HLD --cont home statin  # GERD --cont home PPI  # BPH --cont home Flomax  # Hypokalemia, persistent --unclear etiology, required daily repletion so far. --Monitor and replete PRN with oral potassium   DVT prophylaxis: Lovenox SQ Code Status: Full code  Family Communication: wife updated at bedside today Level of care: Med-Surg Dispo:   The patient is from: home Anticipated d/c is to: home Anticipated d/c date is: 1-2 days Patient currently is not medically ready to  d/c due to: pending ID consult and neurosurgery management  of the drains.   Subjective and Interval History:  Pt reported wound looking better and swelling reduced, but still sore.  Still on a lot of opioids pain meds.  Had BM yesterday.   Objective: Vitals:   10/25/20 0352 10/25/20 0753 10/25/20 1621 10/25/20 2006  BP: 120/75 117/70 123/71 119/70  Pulse: 72 70 69 (!) 59  Resp: 18 16 16 17   Temp: 98.2 F (36.8 C) 98.1 F (36.7 C) 98.7 F (37.1 C) 98.6 F (37 C)  TempSrc: Oral Oral  Oral  SpO2: 97% 97% 96% 97%  Weight:      Height:        Intake/Output Summary (Last 24 hours) at 10/25/2020 2038 Last data filed at 10/25/2020 1348 Gross per 24 hour  Intake 0 ml  Output 20 ml  Net -20 ml   Filed Weights   10/21/20 2117 10/23/20 0500  Weight: 83.5 kg 81.5 kg    Examination:   Constitutional: NAD, AAOx3 HEENT: conjunctivae and lids normal, EOMI CV: No cyanosis.   RESP: normal respiratory effort, on RA Extremities: No effusions, edema in BLE MSK: swelling around surgical dressing reduced.  Drain present. SKIN: warm, dry Neuro: II - XII grossly intact.   Psych: Normal mood and affect.  Appropriate judgement and reason   Photo of wife's photo, taken 1-2 weeks after initial surgery     Data Reviewed: I have personally reviewed following labs and imaging studies  CBC: Recent Labs  Lab 10/21/20 2122 10/22/20 0455 10/23/20 0456 10/24/20 0512 10/25/20 0545  WBC 8.6 9.3 16.5* 12.5* 9.6  NEUTROABS 4.5 5.3  --   --   --   HGB 13.9 13.9 13.7 12.5* 12.3*  HCT 43.1 42.3 41.8 38.2* 37.4*  MCV 88.3 87.2 86.5 86.8 87.0  PLT 215 211 231 212 660   Basic Metabolic Panel: Recent Labs  Lab 10/21/20 2122 10/22/20 0455 10/23/20 0456 10/24/20 0512 10/25/20 0545  NA 136 137 137 137 137  K 3.6 3.3* 3.9 3.2* 3.4*  CL 99 99 103 104 105  CO2 28 30 24 25 25   GLUCOSE 176* 127* 185* 200* 140*  BUN 16 12 20 18 14   CREATININE 0.98 0.80 0.71 0.72 0.67  CALCIUM 8.9 8.8* 8.9  8.5* 8.5*  MG  --  1.6* 1.9 1.8 1.8   GFR: Estimated Creatinine Clearance: 94.1 mL/min (by C-G formula based on SCr of 0.67 mg/dL). Liver Function Tests: No results for input(s): AST, ALT, ALKPHOS, BILITOT, PROT, ALBUMIN in the last 168 hours. No results for input(s): LIPASE, AMYLASE in the last 168 hours. No results for input(s): AMMONIA in the last 168 hours. Coagulation Profile: No results for input(s): INR, PROTIME in the last 168 hours. Cardiac Enzymes: No results for input(s): CKTOTAL, CKMB, CKMBINDEX, TROPONINI in the last 168 hours. BNP (last 3 results) No results for input(s): PROBNP in the last 8760 hours. HbA1C: No results for input(s): HGBA1C in the last 72 hours. CBG: Recent Labs  Lab 10/24/20 1610 10/24/20 2012 10/25/20 0827 10/25/20 1213 10/25/20 1623  GLUCAP 143* 130* 128* 131* 115*   Lipid Profile: No results for input(s): CHOL, HDL, LDLCALC, TRIG, CHOLHDL, LDLDIRECT in the last 72 hours. Thyroid Function Tests: No results for input(s): TSH, T4TOTAL, FREET4, T3FREE, THYROIDAB in the last 72 hours. Anemia Panel: No results for input(s): VITAMINB12, FOLATE, FERRITIN, TIBC, IRON, RETICCTPCT in the last 72 hours. Sepsis Labs: No results for input(s): PROCALCITON, LATICACIDVEN in the last 168 hours.  Recent Results (from the past 240  hour(s))  Blood culture (routine x 2)     Status: None (Preliminary result)   Collection Time: 10/22/20 12:20 AM   Specimen: BLOOD  Result Value Ref Range Status   Specimen Description BLOOD BLOOD RIGHT FOREARM  Final   Special Requests   Final    BOTTLES DRAWN AEROBIC AND ANAEROBIC Blood Culture results may not be optimal due to an inadequate volume of blood received in culture bottles   Culture   Final    NO GROWTH 3 DAYS Performed at [facility], [address] [place], [place] [address]    Report Status PENDING  Incomplete  Blood culture (routine x 2)     Status: None (Preliminary result)   Collection Time:  10/22/20 12:20 AM   Specimen: BLOOD  Result Value Ref Range Status   Specimen Description BLOOD RIGHT ANTECUBITAL  Final   Special Requests   Final    BOTTLES DRAWN AEROBIC AND ANAEROBIC Blood Culture results may not be optimal due to an inadequate volume of blood received in culture bottles   Culture   Final    NO GROWTH 3 DAYS Performed at [facility], [address] [place], [place] [address]    Report Status PENDING  Incomplete  Resp Panel by RT-PCR (Flu A&B, Covid) Nasopharyngeal Swab     Status: None   Collection Time: 10/22/20 12:20 AM   Specimen: Nasopharyngeal Swab; Nasopharyngeal(NP) swabs in vial transport medium  Result Value Ref Range Status   SARS Coronavirus 2 by RT PCR NEGATIVE NEGATIVE Final    Comment: (NOTE) SARS-CoV-2 target nucleic acids are NOT DETECTED.  The SARS-CoV-2 RNA is generally detectable in upper respiratory specimens during the acute phase of infection. The lowest concentration of SARS-CoV-2 viral copies this assay can detect is 138 copies/mL. A negative result does not preclude SARS-Cov-2 infection and should not be used as the sole basis for treatment or other patient management decisions. A negative result may occur with  improper specimen collection/handling, submission of specimen other than nasopharyngeal swab, presence of viral mutation(s) within the areas targeted by this assay, and inadequate number of viral copies(<138 copies/mL). A negative result must be combined with clinical observations, patient history, and epidemiological information. The expected result is Negative.  Fact Sheet for Patients:  EntrepreneurPulse.com.au  Fact Sheet for Healthcare Providers:  IncredibleEmployment.be  This test is no t yet approved or cleared by the [place] FDA and  has been authorized for detection and/or diagnosis of SARS-CoV-2 by FDA under an Emergency Use Authorization (EUA). This EUA will  remain  in effect (meaning this test can be used) for the duration of the COVID-19 declaration under Section 564(b)(1) of the Act, 21 U.S.C.section 360bbb-3(b)(1), unless the authorization is terminated  or revoked sooner.       Influenza A by PCR NEGATIVE NEGATIVE Final   Influenza B by PCR NEGATIVE NEGATIVE Final    Comment: (NOTE) The Xpert Xpress SARS-CoV-2/FLU/RSV plus assay is intended as an aid in the diagnosis of influenza from Nasopharyngeal swab specimens and should not be used as a sole basis for treatment. Nasal washings and aspirates are unacceptable for Xpert Xpress SARS-CoV-2/FLU/RSV testing.  Fact Sheet for Patients: EntrepreneurPulse.com.au  Fact Sheet for Healthcare Providers: IncredibleEmployment.be  This test is not yet approved or cleared by the [place] FDA and has been authorized for detection and/or diagnosis of SARS-CoV-2 by FDA under an Emergency Use Authorization (EUA). This EUA will remain in effect (meaning this test can be  used) for the duration of the COVID-19 declaration under Section 564(b)(1) of the Act, 21 U.S.C. section 360bbb-3(b)(1), unless the authorization is terminated or revoked.  Performed at [facility], [place], [place] [address]   Aerobic/Anaerobic Culture w Gram Stain (surgical/deep wound)     Status: None (Preliminary result)   Collection Time: 10/22/20 11:46 AM   Specimen: PATH Disc; Tissue  Result Value Ref Range Status   Specimen Description   Final    WOUND Performed at [facility], [address] [place], [place] [address]    Special Requests LUMBAR SUBFASCIAL FLUID ID A  Final   Gram Stain NO WBC SEEN NO ORGANISMS SEEN   Final   Culture   Final    NO GROWTH 3 DAYS NO ANAEROBES ISOLATED; CULTURE IN PROGRESS FOR 5 DAYS Performed at [facility], [place] [address], [place], [place] [address]    Report Status PENDING  Incomplete   Aerobic/Anaerobic Culture w Gram Stain (surgical/deep wound)     Status: None (Preliminary result)   Collection Time: 10/22/20 11:48 AM   Specimen: PATH Disc; Tissue  Result Value Ref Range Status   Specimen Description   Final    WOUND Performed at [facility], [address] [place], [place] [address]    Special Requests LUMBAR EPIFASCIAL FLUID ID B  Final   Gram Stain NO WBC SEEN NO ORGANISMS SEEN   Final   Culture   Final    NO GROWTH 3 DAYS NO ANAEROBES ISOLATED; CULTURE IN PROGRESS FOR 5 DAYS Performed at [facility] [address], [place], [place] [address]    Report Status PENDING  Incomplete      Radiology Studies: No results found.   Scheduled Meds: . atorvastatin  40 mg Oral QHS  . celecoxib  200 mg Oral BID  . enoxaparin (LOVENOX) injection  40 mg Subcutaneous Q24H  . insulin aspart  0-15 Units Subcutaneous TID WC  . insulin aspart  0-5 Units Subcutaneous QHS  . pantoprazole  40 mg Oral Daily  . polyethylene glycol  34 g Oral BID  . tamsulosin  0.4 mg Oral Daily   Continuous Infusions: . ceFEPime (MAXIPIME) IV 2 g (10/25/20 1506)  . vancomycin 1,250 mg (10/25/20 1826)     LOS: 3 days     Enzo Bi, MD [facility] If 7PM-7AM, please contact night-coverage 10/25/2020, 8:38 PM

## 2020-10-25 NOTE — Progress Notes (Signed)
    Attending Progress Note 09/14/20 - Extension of Fusion to T10, L1-2 decompression 10/22/20 - washout and reclosure  History: LARUE LIGHTNER is here for possible wound infection after prior lumbar spine surgery.  POD3: Patient says drains uncomfortable but he has been getting up to bathroom and moving. ID saw patient and cultures no growth, remains on broad spectrum Abx. Drains with continued output  POD2: Patient with back pain today. Drains with minimal output (Drain #2 none). Cultures no growth to date. On broad specturm antibiotics  POD1: Feeling better than yesterday.  Pain control adequate.  Physical Exam: Vitals:   10/25/20 0352 10/25/20 0753  BP: 120/75 117/70  Pulse: 72 70  Resp: 18 16  Temp: 98.2 F (36.8 C) 98.1 F (36.7 C)  SpO2: 97% 97%    Awake, Alert Strength:5/5 throughout BLE Sensation intact throughout  Wound vac removed Drain#1 - 13 Drain #3 - 23   Data:  Recent Labs  Lab 10/23/20 0456 10/24/20 0512 10/25/20 0545  NA 137 137 137  K 3.9 3.2* 3.4*  CL 103 104 105  CO2 24 25 25   BUN 20 18 14   CREATININE 0.71 0.72 0.67  GLUCOSE 185* 200* 140*  CALCIUM 8.9 8.5* 8.5*   No results for input(s): AST, ALT, ALKPHOS in the last 168 hours.  Invalid input(s): TBILI   Recent Labs  Lab 10/23/20 0456 10/24/20 0512 10/25/20 0545  WBC 16.5* 12.5* 9.6  HGB 13.7 12.5* 12.3*  HCT 41.8 38.2* 37.4*  PLT 231 212 205   No results for input(s): APTT, INR in the last 168 hours.       Other tests/results: Cultures NGTD  Assessment/Plan:  DAINEL ARCIDIACONO is stable after washout and reclosure  - mobilize - pain control - DVT prophylaxis started - PTOT - Await culture results - appreciate ID consultation - continue abx pending culture results. - Continue drains #1, 3 - Drain #2 removed   Deetta Perla, MD Department of Neurosurgery

## 2020-10-25 NOTE — Progress Notes (Signed)
[facility] INFECTIOUS DISEASE PROGRESS NOTE Date of Admission:  10/21/2020     ID: Howard Murphy is a 65 y.o. male with wound infection  Principal Problem:   Wound infection after surgery Active Problems:   GERD (gastroesophageal reflux disease)   Hyperlipidemia   Type 2 diabetes mellitus (HCC)   Benign prostatic hyperplasia   Low back pain   Subjective: No fevers, wbc nml.Is up and about withdrains still in place   ROS  Eleven systems are reviewed and negative except per hpi  Medications:  Antibiotics Given (last 72 hours)    Date/Time Action Medication Dose Rate   10/22/20 1150 Given   vancomycin (VANCOCIN) powder 1,000 mg    10/22/20 1152 Given   vancomycin (VANCOCIN) powder 1,000 mg    10/22/20 1741 New Bag/Given   ceFEPIme (MAXIPIME) 2 g in sodium chloride 0.9 % 100 mL IVPB 2 g 200 mL/hr   10/22/20 1834 New Bag/Given   vancomycin (VANCOREADY) IVPB 1750 mg/350 mL 1,750 mg 175 mL/hr   10/23/20 0024 New Bag/Given   ceFEPIme (MAXIPIME) 2 g in sodium chloride 0.9 % 100 mL IVPB 2 g 200 mL/hr   10/23/20 0534 New Bag/Given   vancomycin (VANCOREADY) IVPB 1250 mg/250 mL 1,250 mg 166.7 mL/hr   10/23/20 0823 New Bag/Given   ceFEPIme (MAXIPIME) 2 g in sodium chloride 0.9 % 100 mL IVPB 2 g 200 mL/hr   10/23/20 1624 New Bag/Given   ceFEPIme (MAXIPIME) 2 g in sodium chloride 0.9 % 100 mL IVPB 2 g 200 mL/hr   10/23/20 1723 New Bag/Given   vancomycin (VANCOREADY) IVPB 1250 mg/250 mL 1,250 mg 166.7 mL/hr   10/24/20 0014 New Bag/Given   ceFEPIme (MAXIPIME) 2 g in sodium chloride 0.9 % 100 mL IVPB 2 g 200 mL/hr   10/24/20 0529 New Bag/Given   vancomycin (VANCOREADY) IVPB 1250 mg/250 mL 1,250 mg 166.7 mL/hr   10/24/20 0835 New Bag/Given   ceFEPIme (MAXIPIME) 2 g in sodium chloride 0.9 % 100 mL IVPB 2 g 200 mL/hr   10/24/20 1659 New Bag/Given   ceFEPIme (MAXIPIME) 2 g in sodium chloride 0.9 % 100 mL IVPB 2 g 200 mL/hr   10/24/20 1837 New Bag/Given   vancomycin (VANCOREADY) IVPB  1250 mg/250 mL 1,250 mg 166.7 mL/hr   10/24/20 2331 New Bag/Given   ceFEPIme (MAXIPIME) 2 g in sodium chloride 0.9 % 100 mL IVPB 2 g 200 mL/hr   10/25/20 0559 New Bag/Given   vancomycin (VANCOREADY) IVPB 1250 mg/250 mL 1,250 mg 166.7 mL/hr   10/25/20 6962 New Bag/Given   ceFEPIme (MAXIPIME) 2 g in sodium chloride 0.9 % 100 mL IVPB 2 g 200 mL/hr     . atorvastatin  40 mg Oral QHS  . celecoxib  200 mg Oral BID  . enoxaparin (LOVENOX) injection  40 mg Subcutaneous Q24H  . insulin aspart  0-15 Units Subcutaneous TID WC  . insulin aspart  0-5 Units Subcutaneous QHS  . pantoprazole  40 mg Oral Daily  . polyethylene glycol  34 g Oral BID  . potassium chloride  40 mEq Oral Q4H  . tamsulosin  0.4 mg Oral Daily    Objective: Vital signs in last 24 hours: Temp:  [97.8 F (36.6 C)-98.9 F (37.2 C)] 98.1 F (36.7 C) (05/17 0753) Pulse Rate:  [70-87] 70 (05/17 0753) Resp:  [16-20] 16 (05/17 0753) BP: (117-130)/(70-84) 117/70 (05/17 0753) SpO2:  [96 %-97 %] 97 % (05/17 0753) Constitutional: He is oriented to person, place, and  time. He appears well-developed and well-nourished. No distress.  HENT:  Mouth/Throat: Oropharynx is clear and moist. No oropharyngeal exudate.  Cardiovascular: Normal rate, regular rhythm and normal heart sounds. Exam reveals no gallop and no friction rub.  No murmur heard.  Pulmonary/Chest: Effort normal and breath sounds normal. No respiratory distress. He has no wheezes.  Abdominal: Soft. Bowel sounds are normal. He exhibits no distension. There is no tenderness.  Lymphadenopathy: He has no cervical adenopathy.  Neurological: He is alert and oriented to person, place, and time.  Skin: Psychiatric: He has a normal mood and affect. His behavior is normal.        Lab Results Recent Labs    10/24/20 0512 10/25/20 0545  WBC 12.5* 9.6  HGB 12.5* 12.3*  HCT 38.2* 37.4*  NA 137 137  K 3.2* 3.4*  CL 104 105  CO2 25 25  BUN 18 14  CREATININE 0.72 0.67     Microbiology: Results for orders placed or performed during the hospital encounter of 10/21/20  Blood culture (routine x 2)     Status: None (Preliminary result)   Collection Time: 10/22/20 12:20 AM   Specimen: BLOOD  Result Value Ref Range Status   Specimen Description BLOOD BLOOD RIGHT FOREARM  Final   Special Requests   Final    BOTTLES DRAWN AEROBIC AND ANAEROBIC Blood Culture results may not be optimal due to an inadequate volume of blood received in culture bottles   Culture   Final    NO GROWTH 2 DAYS Performed at [facility], [address], [place], [place] [address]    Report Status PENDING  Incomplete  Blood culture (routine x 2)     Status: None (Preliminary result)   Collection Time: 10/22/20 12:20 AM   Specimen: BLOOD  Result Value Ref Range Status   Specimen Description BLOOD RIGHT ANTECUBITAL  Final   Special Requests   Final    BOTTLES DRAWN AEROBIC AND ANAEROBIC Blood Culture results may not be optimal due to an inadequate volume of blood received in culture bottles   Culture   Final    NO GROWTH 2 DAYS Performed at [facility], [address] [place], [place] [address]    Report Status PENDING  Incomplete  Resp Panel by RT-PCR (Flu A&B, Covid) Nasopharyngeal Swab     Status: None   Collection Time: 10/22/20 12:20 AM   Specimen: Nasopharyngeal Swab; Nasopharyngeal(NP) swabs in vial transport medium  Result Value Ref Range Status   SARS Coronavirus 2 by RT PCR NEGATIVE NEGATIVE Final    Comment: (NOTE) SARS-CoV-2 target nucleic acids are NOT DETECTED.  The SARS-CoV-2 RNA is generally detectable in upper respiratory specimens during the acute phase of infection. The lowest concentration of SARS-CoV-2 viral copies this assay can detect is 138 copies/mL. A negative result does not preclude SARS-Cov-2 infection and should not be used as the sole basis for treatment or other patient management decisions. A negative result may occur with   improper specimen collection/handling, submission of specimen other than nasopharyngeal swab, presence of viral mutation(s) within the areas targeted by this assay, and inadequate number of viral copies(<138 copies/mL). A negative result must be combined with clinical observations, patient history, and epidemiological information. The expected result is Negative.  Fact Sheet for Patients:  EntrepreneurPulse.com.au  Fact Sheet for Healthcare Providers:  IncredibleEmployment.be  This test is no t yet approved or cleared by the [place] FDA and  has been authorized for detection and/or  diagnosis of SARS-CoV-2 by FDA under an Emergency Use Authorization (EUA). This EUA will remain  in effect (meaning this test can be used) for the duration of the COVID-19 declaration under Section 564(b)(1) of the Act, 21 U.S.C.section 360bbb-3(b)(1), unless the authorization is terminated  or revoked sooner.       Influenza A by PCR NEGATIVE NEGATIVE Final   Influenza B by PCR NEGATIVE NEGATIVE Final    Comment: (NOTE) The Xpert Xpress SARS-CoV-2/FLU/RSV plus assay is intended as an aid in the diagnosis of influenza from Nasopharyngeal swab specimens and should not be used as a sole basis for treatment. Nasal washings and aspirates are unacceptable for Xpert Xpress SARS-CoV-2/FLU/RSV testing.  Fact Sheet for Patients: EntrepreneurPulse.com.au  Fact Sheet for Healthcare Providers: IncredibleEmployment.be  This test is not yet approved or cleared by the [place] FDA and has been authorized for detection and/or diagnosis of SARS-CoV-2 by FDA under an Emergency Use Authorization (EUA). This EUA will remain in effect (meaning this test can be used) for the duration of the COVID-19 declaration under Section 564(b)(1) of the Act, 21 U.S.C. section 360bbb-3(b)(1), unless the authorization is terminated  or revoked.  Performed at [facility], [place], [place] [address]   Aerobic/Anaerobic Culture w Gram Stain (surgical/deep wound)     Status: None (Preliminary result)   Collection Time: 10/22/20 11:46 AM   Specimen: PATH Disc; Tissue  Result Value Ref Range Status   Specimen Description   Final    WOUND Performed at [facility], [address] [place], [place] [address]    Special Requests LUMBAR SUBFASCIAL FLUID ID A  Final   Gram Stain NO WBC SEEN NO ORGANISMS SEEN   Final   Culture   Final    NO GROWTH 3 DAYS NO ANAEROBES ISOLATED; CULTURE IN PROGRESS FOR 5 DAYS Performed at [facility], [place] [address] [place], [place] [address]    Report Status PENDING  Incomplete  Aerobic/Anaerobic Culture w Gram Stain (surgical/deep wound)     Status: None (Preliminary result)   Collection Time: 10/22/20 11:48 AM   Specimen: PATH Disc; Tissue  Result Value Ref Range Status   Specimen Description   Final    WOUND Performed at [facility], [address] [place], [place] [address]    Special Requests LUMBAR EPIFASCIAL FLUID ID B  Final   Gram Stain NO WBC SEEN NO ORGANISMS SEEN   Final   Culture   Final    NO GROWTH 3 DAYS NO ANAEROBES ISOLATED; CULTURE IN PROGRESS FOR 5 DAYS Performed at [facility] [address], [place], [place] [address]    Report Status PENDING  Incomplete    Studies/Results: No results found.  Assessment/Plan: DEMARIS LEAVELL is a 65 y.o. male with recent (4/6)  spinal fusion and decompression in the thoracic and lumbar region admitted May 13 with some increasing redness and mild drainage from the site.  MRI showed possible small seromas although infection could not be ruled out.  White count was normal and sed rate and CRP were relatively stable.  He underwent a washout with no findings of pus.  Cultures are negative to date (were done prior to antibiotics)  5/17 improving. Cx  neg Recommendations Continue current antibiotics. If cultures remain negative we can consider transitioning to treatment with oral antibiotics for postop skin and soft tissue infection without any deep involvement. Thank you very much for the consult. Will follow  with you.  Howard Murphy   10/25/2020, 10:47 AM

## 2020-10-25 NOTE — Consult Note (Signed)
Pharmacy Antibiotic Note  Howard Murphy is a 65 y.o. male admitted on 10/21/2020 with suspected wound infection 4.5 s/p orthopedic fusion surgery. Pharmacy has been consulted for Vancomycin dosing.  5/14 I&D of surgical wound. Vancomycin 2 gram cement beads inserted  Day 4 vancomycin and cefepime  Plan: Vancomycin peak: 44 and vancomycin trough: 12 (drawn late) on vancomycin 1750 mg q12h Calculated AUC: 567.1   Decrease vancomycin dose to 750 mg IV q8h. Goal AUC 400-550  New expected AUC 509.7   Expected Cmax: 32.2, and Cmin: 12.8 Patient is also ordered cefepime 2g IV q8h Monitor clinical picture, renal function, vancomycin levels as needed   F/U C&S, abx deescalation / LOT    Height: 5\' 7"  (170.2 cm) Weight: 81.5 kg (179 lb 10.8 oz) IBW/kg (Calculated) : 66.1  Temp (24hrs), Avg:98.1 F (36.7 C), Min:97.8 F (36.6 C), Max:98.2 F (36.8 C)  Recent Labs  Lab 10/21/20 2122 10/22/20 0455 10/23/20 0456 10/24/20 0512 10/24/20 1953 10/25/20 0545  WBC 8.6 9.3 16.5* 12.5*  --  9.6  CREATININE 0.98 0.80 0.71 0.72  --  0.67  VANCOTROUGH  --   --   --   --   --  12*  VANCOPEAK  --   --   --   --  44*  --     Estimated Creatinine Clearance: 94.1 mL/min (by C-G formula based on SCr of 0.67 mg/dL).    Allergies  Allergen Reactions  . Adhesive [Tape] Other (See Comments)    Honeycomb dressing, blisters    Antimicrobials this admission: 5/14 cefepime>>  5/14 vancomycin >>   Dose adjustments this admission: n/a  Microbiology results: 5/14: Wound culture: NGx3D 5/14 BCx: NGx2D  Thank you for allowing pharmacy to be a part of this patient's care.  Darnelle Bos, PharmD 10/25/2020 1:58 PM

## 2020-10-25 NOTE — Progress Notes (Addendum)
Pharmacy - Vancomycin dosing  Update to previous vancomycin dosing note  Patient on vancomycin 1250mg  IV q12h (dosing at 5a/5p) Vancomycin dose given at 18:37 Peak = 44 mcg/mL @ 19:53 Trough = 12 mcg/ml @ 0545  A:    5/16 5p dose administration delayed by 1.5h, end of infusion at would have been at 20:07, meaning the 19:53 peak was drawn while vancomycin infusing??  Since dose given late last evening, technically trough this morning not late.   Plan:  Resume vancomycin 1250mg  IV q12h as trough appears to be within desirable range.  The peak is invalid based on timing (vancomycin infusing at draw time)  Monitor renal function  Doreene Eland, PharmD, BCPS.   Work Cell: [phone] 10/25/2020 4:23 PM

## 2020-10-26 LAB — BASIC METABOLIC PANEL
Anion gap: 5 (ref 5–15)
BUN: 13 mg/dL (ref 8–23)
CO2: 26 mmol/L (ref 22–32)
Calcium: 8.8 mg/dL — ABNORMAL LOW (ref 8.9–10.3)
Chloride: 107 mmol/L (ref 98–111)
Creatinine, Ser: 0.69 mg/dL (ref 0.61–1.24)
GFR, Estimated: >60 mL/min (ref >60)
Glucose, Bld: 142 mg/dL — ABNORMAL HIGH (ref 70–99)
Potassium: 3.8 mmol/L (ref 3.5–5.1)
Sodium: 138 mmol/L (ref 135–145)

## 2020-10-26 LAB — CBC
HCT: 38.9 % — ABNORMAL LOW (ref 39.0–52.0)
Hemoglobin: 12.9 g/dL — ABNORMAL LOW (ref 13.0–17.0)
MCH: 28.3 pg (ref 26.0–34.0)
MCHC: 33.2 g/dL (ref 30.0–36.0)
MCV: 85.3 fL (ref 80.0–100.0)
Platelets: 227 10*3/uL (ref 150–400)
RBC: 4.56 MIL/uL (ref 4.22–5.81)
RDW: 13.6 % (ref 11.5–15.5)
WBC: 9 10*3/uL (ref 4.0–10.5)
nRBC: 0 % (ref 0.0–0.2)

## 2020-10-26 LAB — GLUCOSE, CAPILLARY
Glucose-Capillary: 126 mg/dL — ABNORMAL HIGH (ref 70–99)
Glucose-Capillary: 177 mg/dL — ABNORMAL HIGH (ref 70–99)
Glucose-Capillary: 148 mg/dL — ABNORMAL HIGH (ref 70–99)
Glucose-Capillary: 167 mg/dL — ABNORMAL HIGH (ref 70–99)

## 2020-10-26 LAB — MAGNESIUM: Magnesium: 1.8 mg/dL (ref 1.7–2.4)

## 2020-10-26 MED ORDER — SODIUM CHLORIDE 0.9 % IV SOLN
INTRAVENOUS | Status: DC | PRN
  Administered 2020-10-26: 500 mL via INTRAVENOUS

## 2020-10-26 NOTE — Progress Notes (Signed)
    Attending Progress Note 09/14/20 - Extension of Fusion to T10, L1-2 decompression 10/22/20 - washout and reclosure  History: SYMON NORWOOD is here for possible wound infection after prior lumbar spine surgery.  POD4: Doing well. Pain 7/10.  Drains put out little overnight.  POD3: Patient says drains uncomfortable but he has been getting up to bathroom and moving. ID saw patient and cultures no growth, remains on broad spectrum Abx. Drains with continued output  POD2: Patient with back pain today. Drains with minimal output (Drain #2 none). Cultures no growth to date. On broad specturm antibiotics  POD1: Feeling better than yesterday.  Pain control adequate.  Physical Exam: Vitals:   10/26/20 0052 10/26/20 0547  BP: 132/79 138/78  Pulse: 64 (!) 54  Resp: 16 16  Temp: 98.4 F (36.9 C) 98.6 F (37 C)  SpO2: 97% 97%    Awake, Alert Strength:5/5 throughout BLE Sensation intact throughout  Wound vac off. Incision c/d/i  Drain 1 removed  Data:  Recent Labs  Lab 10/24/20 0512 10/25/20 0545 10/26/20 0520  NA 137 137 138  K 3.2* 3.4* 3.8  CL 104 105 107  CO2 25 25 26   BUN 18 14 13   CREATININE 0.72 0.67 0.69  GLUCOSE 200* 140* 142*  CALCIUM 8.5* 8.5* 8.8*   No results for input(s): AST, ALT, ALKPHOS in the last 168 hours.  Invalid input(s): TBILI   Recent Labs  Lab 10/24/20 0512 10/25/20 0545 10/26/20 0520  WBC 12.5* 9.6 9.0  HGB 12.5* 12.3* 12.9*  HCT 38.2* 37.4* 38.9*  PLT 212 205 227   No results for input(s): APTT, INR in the last 168 hours.       Other tests/results: Cultures NGTD  Assessment/Plan:  JAICOB DIA is stable after washout and reclosure  - mobilize - pain control - DVT prophylaxis started - PTOT - Await culture results - appreciate ID consultation - continue abx pending culture results. - Continue drains #3 - Drains #1 and #2 removed   Meade Maw, MD Department of Neurosurgery

## 2020-10-26 NOTE — Progress Notes (Signed)
[facility] INFECTIOUS DISEASE PROGRESS NOTE Date of Admission:  10/21/2020     ID: Howard Murphy is a 65 y.o. male with wound infection  Principal Problem:   Wound infection after surgery Active Problems:   GERD (gastroesophageal reflux disease)   Hyperlipidemia   Type 2 diabetes mellitus (HCC)   Benign prostatic hyperplasia   Low back pain   Subjective: No fevers, wbc nml.Is up and about withdrains still in place   ROS  Eleven systems are reviewed and negative except per hpi  Medications:  Antibiotics Given (last 72 hours)    Date/Time Action Medication Dose Rate   10/23/20 1624 New Bag/Given   ceFEPIme (MAXIPIME) 2 g in sodium chloride 0.9 % 100 mL IVPB 2 g 200 mL/hr   10/23/20 1723 New Bag/Given   vancomycin (VANCOREADY) IVPB 1250 mg/250 mL 1,250 mg 166.7 mL/hr   10/24/20 0014 New Bag/Given   ceFEPIme (MAXIPIME) 2 g in sodium chloride 0.9 % 100 mL IVPB 2 g 200 mL/hr   10/24/20 0529 New Bag/Given   vancomycin (VANCOREADY) IVPB 1250 mg/250 mL 1,250 mg 166.7 mL/hr   10/24/20 0835 New Bag/Given   ceFEPIme (MAXIPIME) 2 g in sodium chloride 0.9 % 100 mL IVPB 2 g 200 mL/hr   10/24/20 1659 New Bag/Given   ceFEPIme (MAXIPIME) 2 g in sodium chloride 0.9 % 100 mL IVPB 2 g 200 mL/hr   10/24/20 1837 New Bag/Given   vancomycin (VANCOREADY) IVPB 1250 mg/250 mL 1,250 mg 166.7 mL/hr   10/24/20 2331 New Bag/Given   ceFEPIme (MAXIPIME) 2 g in sodium chloride 0.9 % 100 mL IVPB 2 g 200 mL/hr   10/25/20 0559 New Bag/Given   vancomycin (VANCOREADY) IVPB 1250 mg/250 mL 1,250 mg 166.7 mL/hr   10/25/20 6387 New Bag/Given   ceFEPIme (MAXIPIME) 2 g in sodium chloride 0.9 % 100 mL IVPB 2 g 200 mL/hr   10/25/20 1506 New Bag/Given   ceFEPIme (MAXIPIME) 2 g in sodium chloride 0.9 % 100 mL IVPB 2 g 200 mL/hr   10/25/20 1826 New Bag/Given   vancomycin (VANCOREADY) IVPB 1250 mg/250 mL 1,250 mg 166.7 mL/hr   10/25/20 2321 New Bag/Given   ceFEPIme (MAXIPIME) 2 g in sodium chloride 0.9 % 100 mL  IVPB 2 g 200 mL/hr   10/26/20 0516 New Bag/Given   vancomycin (VANCOREADY) IVPB 1250 mg/250 mL 1,250 mg 166.7 mL/hr   10/26/20 0904 New Bag/Given   ceFEPIme (MAXIPIME) 2 g in sodium chloride 0.9 % 100 mL IVPB 2 g 200 mL/hr     . atorvastatin  40 mg Oral QHS  . celecoxib  200 mg Oral BID  . enoxaparin (LOVENOX) injection  40 mg Subcutaneous Q24H  . insulin aspart  0-15 Units Subcutaneous TID WC  . insulin aspart  0-5 Units Subcutaneous QHS  . pantoprazole  40 mg Oral Daily  . polyethylene glycol  34 g Oral BID  . tamsulosin  0.4 mg Oral Daily    Objective: Vital signs in last 24 hours: Temp:  [98.3 F (36.8 C)-98.8 F (37.1 C)] 98.3 F (36.8 C) (05/18 1143) Pulse Rate:  [54-78] 78 (05/18 1143) Resp:  [16-18] 18 (05/18 1143) BP: (119-138)/(70-79) 125/79 (05/18 1143) SpO2:  [95 %-97 %] 96 % (05/18 1143) FiO2 (%):  [0 %] 0 % (05/18 0547) Constitutional: He is oriented to person, place, and time. He appears well-developed and well-nourished. No distress.  HENT:  Mouth/Throat: Oropharynx is clear and moist. No oropharyngeal exudate.  Cardiovascular: Normal rate, regular rhythm  and normal heart sounds. Exam reveals no gallop and no friction rub.  No murmur heard.  Pulmonary/Chest: Effort normal and breath sounds normal. No respiratory distress. He has no wheezes.  Abdominal: Soft. Bowel sounds are normal. He exhibits no distension. There is no tenderness.  Lymphadenopathy: He has no cervical adenopathy.  Neurological: He is alert and oriented to person, place, and time.  Skin: Psychiatric: He has a normal mood and affect. His behavior is normal.        Lab Results Recent Labs    10/25/20 0545 10/26/20 0520  WBC 9.6 9.0  HGB 12.3* 12.9*  HCT 37.4* 38.9*  NA 137 138  K 3.4* 3.8  CL 105 107  CO2 25 26  BUN 14 13  CREATININE 0.67 0.69    Microbiology: Results for orders placed or performed during the hospital encounter of 10/21/20  Blood culture (routine x 2)      Status: None (Preliminary result)   Collection Time: 10/22/20 12:20 AM   Specimen: BLOOD  Result Value Ref Range Status   Specimen Description BLOOD BLOOD RIGHT FOREARM  Final   Special Requests   Final    BOTTLES DRAWN AEROBIC AND ANAEROBIC Blood Culture results may not be optimal due to an inadequate volume of blood received in culture bottles   Culture   Final    NO GROWTH 3 DAYS Performed at [facility], [address], [place], [place] [address]    Report Status PENDING  Incomplete  Blood culture (routine x 2)     Status: None (Preliminary result)   Collection Time: 10/22/20 12:20 AM   Specimen: BLOOD  Result Value Ref Range Status   Specimen Description BLOOD RIGHT ANTECUBITAL  Final   Special Requests   Final    BOTTLES DRAWN AEROBIC AND ANAEROBIC Blood Culture results may not be optimal due to an inadequate volume of blood received in culture bottles   Culture   Final    NO GROWTH 3 DAYS Performed at [facility], [address], [place], [place] [address]    Report Status PENDING  Incomplete  Resp Panel by RT-PCR (Flu A&B, Covid) Nasopharyngeal Swab     Status: None   Collection Time: 10/22/20 12:20 AM   Specimen: Nasopharyngeal Swab; Nasopharyngeal(NP) swabs in vial transport medium  Result Value Ref Range Status   SARS Coronavirus 2 by RT PCR NEGATIVE NEGATIVE Final    Comment: (NOTE) SARS-CoV-2 target nucleic acids are NOT DETECTED.  The SARS-CoV-2 RNA is generally detectable in upper respiratory specimens during the acute phase of infection. The lowest concentration of SARS-CoV-2 viral copies this assay can detect is 138 copies/mL. A negative result does not preclude SARS-Cov-2 infection and should not be used as the sole basis for treatment or other patient management decisions. A negative result may occur with  improper specimen collection/handling, submission of specimen other than nasopharyngeal swab, presence of viral mutation(s) within  the areas targeted by this assay, and inadequate number of viral copies(<138 copies/mL). A negative result must be combined with clinical observations, patient history, and epidemiological information. The expected result is Negative.  Fact Sheet for Patients:  EntrepreneurPulse.com.au  Fact Sheet for Healthcare Providers:  IncredibleEmployment.be  This test is no t yet approved or cleared by the [place] FDA and  has been authorized for detection and/or diagnosis of SARS-CoV-2 by FDA under an Emergency Use Authorization (EUA). This EUA will remain  in effect (meaning this test can be used) for the  duration of the COVID-19 declaration under Section 564(b)(1) of the Act, 21 U.S.C.section 360bbb-3(b)(1), unless the authorization is terminated  or revoked sooner.       Influenza A by PCR NEGATIVE NEGATIVE Final   Influenza B by PCR NEGATIVE NEGATIVE Final    Comment: (NOTE) The Xpert Xpress SARS-CoV-2/FLU/RSV plus assay is intended as an aid in the diagnosis of influenza from Nasopharyngeal swab specimens and should not be used as a sole basis for treatment. Nasal washings and aspirates are unacceptable for Xpert Xpress SARS-CoV-2/FLU/RSV testing.  Fact Sheet for Patients: EntrepreneurPulse.com.au  Fact Sheet for Healthcare Providers: IncredibleEmployment.be  This test is not yet approved or cleared by the [place] FDA and has been authorized for detection and/or diagnosis of SARS-CoV-2 by FDA under an Emergency Use Authorization (EUA). This EUA will remain in effect (meaning this test can be used) for the duration of the COVID-19 declaration under Section 564(b)(1) of the Act, 21 U.S.C. section 360bbb-3(b)(1), unless the authorization is terminated or revoked.  Performed at [facility], [place], [place] [address]   Aerobic/Anaerobic Culture w Gram Stain  (surgical/deep wound)     Status: None (Preliminary result)   Collection Time: 10/22/20 11:46 AM   Specimen: PATH Disc; Tissue  Result Value Ref Range Status   Specimen Description   Final    WOUND Performed at [facility], [address] [place], [place] [address]    Special Requests LUMBAR SUBFASCIAL FLUID ID A  Final   Gram Stain NO WBC SEEN NO ORGANISMS SEEN   Final   Culture   Final    NO GROWTH 3 DAYS NO ANAEROBES ISOLATED; CULTURE IN PROGRESS FOR 5 DAYS Performed at [facility], [place] [address] [place], [place] [address]    Report Status PENDING  Incomplete  Aerobic/Anaerobic Culture w Gram Stain (surgical/deep wound)     Status: None (Preliminary result)   Collection Time: 10/22/20 11:48 AM   Specimen: PATH Disc; Tissue  Result Value Ref Range Status   Specimen Description   Final    WOUND Performed at [facility], [address] [place], [place] [address]    Special Requests LUMBAR EPIFASCIAL FLUID ID B  Final   Gram Stain NO WBC SEEN NO ORGANISMS SEEN   Final   Culture   Final    NO GROWTH 3 DAYS NO ANAEROBES ISOLATED; CULTURE IN PROGRESS FOR 5 DAYS Performed at [facility] [address], [place], [place] [address]    Report Status PENDING  Incomplete    Studies/Results: No results found.  Assessment/Plan: ACEYN KATHOL is a 65 y.o. male with recent (4/6)  spinal fusion and decompression in the thoracic and lumbar region admitted May 13 with some increasing redness and mild drainage from the site.  MRI showed possible small seromas although infection could not be ruled out.  White count was normal and sed rate and CRP were relatively stable.  He underwent a washout with no findings of pus.  Cultures are negative to date (were done prior to antibiotics)  5/17 improving. Cx neg 5/18 no fevers, cx neg  Recommendations Continue current antibiotics while inpatient If cultures remain negative we can consider transitioning  to treatment with oral antibiotics for postop skin and soft tissue infection without any deep involvement.  If stable can dc on doxy 100 bid and cipro 500 bid for 7 more days with close followup for wound check with neurosurgery.  Thank  you very much for the consult. Will follow with you.  Howard Murphy   10/26/2020, 2:42 PM

## 2020-10-26 NOTE — Progress Notes (Signed)
PROGRESS NOTE    Howard Murphy  SVX:[id number] DOB: 01-25-1956 DOA: 10/21/2020 PCP: [facility] [place]    Brief Narrative:  stenosis status post recent open T10-pelvis posterior spinal fusion and L1-L2 posterior spinal decompression,hyperlipidemia, type 2 diabetes mellitus,who is admitted to [facility] on5/13/2022with concern for thoracic/lumbar wound infection of midline incisional surgical siteafter presenting from home to [facility] ED complaining of incisional surgical site erythema.  In the setting of lumbar spinal stenosis, the patient underwent open T10-pelvis posterior spinal fusion and L1-L2 posterior spinal decompression on 09/14/2020 with Dr.Yarbrough ofneurosurgery.Patient reports approximately 2 weeks of progressive erythema, tenderness, swelling associated with thoracic/lumbar midline incisional surgical site.In the interval, the patient followed up in clinic with Dr. Izora Ribas on5/10/2020,at which time he conveys that he was not experiencing any significant drainage from the site.Conservative measures were elected,however, in the interval since this outpatient follow-up, the patient reports further progression of the incisional site erythema, swelling, tenderness as well as interval development of drainage, that has been intermittently purulent in nature.The patient denies any new numbness or paresthesias involving the lower extremities, and denies any saddle anesthesia, new onset urinary retention or fecal incontinence. Denies any associated subjective fever, chills, rigors, or generalized myalgias. The setting of the above,the patient contactedDr Izora Ribas on5/13/2022, with ensuing instructions to present to [facility] ED for further evaluation and management of suspected incisional surgical site infection.   Assessment & Plan:   Principal Problem:   Wound infection after surgery Active Problems:   GERD (gastroesophageal reflux  disease)   Hyperlipidemia   Type 2 diabetes mellitus (HCC)   Benign prostatic hyperplasia   Low back pain   #1.  Wound infection in the thoracic/lumbar area surgical incision site. MRI showed a possible seromas, I&D was performed by Dr. Izora Ribas on 5/14, evidence of pus. Currently culture from I&D is still negative after 3 days.  Patient was followed by ID, still on vancomycin and cefepime. Anticipating oral antibiotics sometime discharge.  2.  Type 2 diabetes. Continue home medicines.        DVT prophylaxis: Lovenox Code Status: Full Family Communication:  Disposition Plan:  .   Status is: Inpatient  Remains inpatient appropriate because:Inpatient level of care appropriate due to severity of illness   Dispo: The patient is from: Home              Anticipated d/c is to: Home              Patient currently is not medically stable to d/c.   Difficult to place patient No        I/O last 3 completed shifts: In: 700 [IV Piggyback:700] Out: 38 [Urine:750; Drains:20] Total I/O In: 86 [P.O.:240; IV Piggyback:100] Out: -      Consultants:   Neurosurgery, ID.  Procedures: Incision and drain.  Antimicrobials: Vancomycin and cefepime.  Subjective: Patient doing well today, no signal pain in the back. No fever chills. No short of breath or cough. No dysuria hematuria. No abdominal pain or nausea vomiting.  Objective: Vitals:   10/25/20 2039 10/26/20 0052 10/26/20 0547 10/26/20 0804  BP:  132/79 138/78 121/74  Pulse: 70 64 (!) 54 78  Resp:  16 16 16   Temp:  98.4 F (36.9 C) 98.6 F (37 C) 98.8 F (37.1 C)  TempSrc:  Oral Oral Oral  SpO2:  97% 97% 95%  Weight:      Height:        Intake/Output Summary (Last 24 hours) at 10/26/2020  [place] filed at 10/26/2020 1031 Gross per 24 hour  Intake 1040 ml  Output 450 ml  Net 590 ml   Filed Weights   10/21/20 2117 10/23/20 0500  Weight: 83.5 kg 81.5 kg    Examination:  General exam:  Appears calm and comfortable  Respiratory system: Clear to auscultation. Respiratory effort normal. Cardiovascular system: S1 & S2 heard, RRR. No JVD, murmurs, rubs, gallops or clicks. No pedal edema. Gastrointestinal system: Abdomen is nondistended, soft and nontender. No organomegaly or masses felt. Normal bowel sounds heard. Central nervous system: Alert and oriented. No focal neurological deficits. Extremities: Symmetric 5 x 5 power. Skin: No rashes, lesions or ulcers Psychiatry: Judgement and insight appear normal. Mood & affect appropriate.     Data Reviewed: I have personally reviewed following labs and imaging studies  CBC: Recent Labs  Lab 10/21/20 2122 10/22/20 0455 10/23/20 0456 10/24/20 0512 10/25/20 0545 10/26/20 0520  WBC 8.6 9.3 16.5* 12.5* 9.6 9.0  NEUTROABS 4.5 5.3  --   --   --   --   HGB 13.9 13.9 13.7 12.5* 12.3* 12.9*  HCT 43.1 42.3 41.8 38.2* 37.4* 38.9*  MCV 88.3 87.2 86.5 86.8 87.0 85.3  PLT 215 211 231 212 205 [id number]   Basic Metabolic Panel: Recent Labs  Lab 10/22/20 0455 10/23/20 0456 10/24/20 0512 10/25/20 0545 10/26/20 0520  NA 137 137 137 137 138  K 3.3* 3.9 3.2* 3.4* 3.8  CL 99 103 104 105 107  CO2 30 24 25 25 26   GLUCOSE 127* 185* 200* 140* 142*  BUN 12 20 18 14 13   CREATININE 0.80 0.71 0.72 0.67 0.69  CALCIUM 8.8* 8.9 8.5* 8.5* 8.8*  MG 1.6* 1.9 1.8 1.8 1.8   GFR: Estimated Creatinine Clearance: 94.1 mL/min (by C-G formula based on SCr of 0.69 mg/dL). Liver Function Tests: No results for input(s): AST, ALT, ALKPHOS, BILITOT, PROT, ALBUMIN in the last 168 hours. No results for input(s): LIPASE, AMYLASE in the last 168 hours. No results for input(s): AMMONIA in the last 168 hours. Coagulation Profile: No results for input(s): INR, PROTIME in the last 168 hours. Cardiac Enzymes: No results for input(s): CKTOTAL, CKMB, CKMBINDEX, TROPONINI in the last 168 hours. BNP (last 3 results) No results for input(s): PROBNP in the last 8760  hours. HbA1C: No results for input(s): HGBA1C in the last 72 hours. CBG: Recent Labs  Lab 10/25/20 0827 10/25/20 1213 10/25/20 1623 10/25/20 2125 10/26/20 0804  GLUCAP 128* 131* 115* 169* 126*   Lipid Profile: No results for input(s): CHOL, HDL, LDLCALC, TRIG, CHOLHDL, LDLDIRECT in the last 72 hours. Thyroid Function Tests: No results for input(s): TSH, T4TOTAL, FREET4, T3FREE, THYROIDAB in the last 72 hours. Anemia Panel: No results for input(s): VITAMINB12, FOLATE, FERRITIN, TIBC, IRON, RETICCTPCT in the last 72 hours. Sepsis Labs: No results for input(s): PROCALCITON, LATICACIDVEN in the last 168 hours.  Recent Results (from the past 240 hour(s))  Blood culture (routine x 2)     Status: None (Preliminary result)   Collection Time: 10/22/20 12:20 AM   Specimen: BLOOD  Result Value Ref Range Status   Specimen Description BLOOD BLOOD RIGHT FOREARM  Final   Special Requests   Final    BOTTLES DRAWN AEROBIC AND ANAEROBIC Blood Culture results may not be optimal due to an inadequate volume of blood received in culture bottles   Culture   Final    NO GROWTH 3 DAYS Performed at [facility], [address] [place],  [place] [address]    Report Status PENDING  Incomplete  Blood culture (routine x 2)     Status: None (Preliminary result)   Collection Time: 10/22/20 12:20 AM   Specimen: BLOOD  Result Value Ref Range Status   Specimen Description BLOOD RIGHT ANTECUBITAL  Final   Special Requests   Final    BOTTLES DRAWN AEROBIC AND ANAEROBIC Blood Culture results may not be optimal due to an inadequate volume of blood received in culture bottles   Culture   Final    NO GROWTH 3 DAYS Performed at [facility], [address] [place], [place] [address]    Report Status PENDING  Incomplete  Resp Panel by RT-PCR (Flu A&B, Covid) Nasopharyngeal Swab     Status: None   Collection Time: 10/22/20 12:20 AM   Specimen: Nasopharyngeal Swab; Nasopharyngeal(NP) swabs  in vial transport medium  Result Value Ref Range Status   SARS Coronavirus 2 by RT PCR NEGATIVE NEGATIVE Final    Comment: (NOTE) SARS-CoV-2 target nucleic acids are NOT DETECTED.  The SARS-CoV-2 RNA is generally detectable in upper respiratory specimens during the acute phase of infection. The lowest concentration of SARS-CoV-2 viral copies this assay can detect is 138 copies/mL. A negative result does not preclude SARS-Cov-2 infection and should not be used as the sole basis for treatment or other patient management decisions. A negative result may occur with  improper specimen collection/handling, submission of specimen other than nasopharyngeal swab, presence of viral mutation(s) within the areas targeted by this assay, and inadequate number of viral copies(<138 copies/mL). A negative result must be combined with clinical observations, patient history, and epidemiological information. The expected result is Negative.  Fact Sheet for Patients:  EntrepreneurPulse.com.au  Fact Sheet for Healthcare Providers:  IncredibleEmployment.be  This test is no t yet approved or cleared by the [place] FDA and  has been authorized for detection and/or diagnosis of SARS-CoV-2 by FDA under an Emergency Use Authorization (EUA). This EUA will remain  in effect (meaning this test can be used) for the duration of the COVID-19 declaration under Section 564(b)(1) of the Act, 21 U.S.C.section 360bbb-3(b)(1), unless the authorization is terminated  or revoked sooner.       Influenza A by PCR NEGATIVE NEGATIVE Final   Influenza B by PCR NEGATIVE NEGATIVE Final    Comment: (NOTE) The Xpert Xpress SARS-CoV-2/FLU/RSV plus assay is intended as an aid in the diagnosis of influenza from Nasopharyngeal swab specimens and should not be used as a sole basis for treatment. Nasal washings and aspirates are unacceptable for Xpert Xpress  SARS-CoV-2/FLU/RSV testing.  Fact Sheet for Patients: EntrepreneurPulse.com.au  Fact Sheet for Healthcare Providers: IncredibleEmployment.be  This test is not yet approved or cleared by the [place] FDA and has been authorized for detection and/or diagnosis of SARS-CoV-2 by FDA under an Emergency Use Authorization (EUA). This EUA will remain in effect (meaning this test can be used) for the duration of the COVID-19 declaration under Section 564(b)(1) of the Act, 21 U.S.C. section 360bbb-3(b)(1), unless the authorization is terminated or revoked.  Performed at [facility], [address] [place], [place] [address]   Aerobic/Anaerobic Culture w Gram Stain (surgical/deep wound)     Status: None (Preliminary result)   Collection Time: 10/22/20 11:46 AM   Specimen: PATH Disc; Tissue  Result Value Ref Range Status   Specimen Description   Final    WOUND Performed at [facility], [address], [place], [place] [address]  Special Requests LUMBAR SUBFASCIAL FLUID ID A  Final   Gram Stain NO WBC SEEN NO ORGANISMS SEEN   Final   Culture   Final    NO GROWTH 3 DAYS NO ANAEROBES ISOLATED; CULTURE IN PROGRESS FOR 5 DAYS Performed at [place] [place] [address], [place], [place] [address]    Report Status PENDING  Incomplete  Aerobic/Anaerobic Culture w Gram Stain (surgical/deep wound)     Status: None (Preliminary result)   Collection Time: 10/22/20 11:48 AM   Specimen: PATH Disc; Tissue  Result Value Ref Range Status   Specimen Description   Final    WOUND Performed at [facility], [address] [place], [place] [address]    Special Requests LUMBAR EPIFASCIAL FLUID ID B  Final   Gram Stain NO WBC SEEN NO ORGANISMS SEEN   Final   Culture   Final    NO GROWTH 3 DAYS NO ANAEROBES ISOLATED; CULTURE IN PROGRESS FOR 5 DAYS Performed at [place] [address] [place], [place] [address]     Report Status PENDING  Incomplete         Radiology Studies: No results found.      Scheduled Meds: . atorvastatin  40 mg Oral QHS  . celecoxib  200 mg Oral BID  . enoxaparin (LOVENOX) injection  40 mg Subcutaneous Q24H  . insulin aspart  0-15 Units Subcutaneous TID WC  . insulin aspart  0-5 Units Subcutaneous QHS  . pantoprazole  40 mg Oral Daily  . polyethylene glycol  34 g Oral BID  . tamsulosin  0.4 mg Oral Daily   Continuous Infusions: . sodium chloride 10 mL/hr at 10/26/20 0900  . ceFEPime (MAXIPIME) IV 2 g (10/26/20 0904)  . vancomycin 1,250 mg (10/26/20 0516)     LOS: 4 days    Time spent: 28 minutes    Sharen Hones, MD [facility]   To contact the attending provider between 7A-7P or the covering provider during after hours 7P-7A, please log into the web site [email or web address] and access using universal Export password for that web site. If you do not have the password, please call the hospital operator.  10/26/2020, 10:58 AM

## 2020-10-27 LAB — BASIC METABOLIC PANEL
Anion gap: 6 (ref 5–15)
BUN: 16 mg/dL (ref 8–23)
CO2: 26 mmol/L (ref 22–32)
Calcium: 8.7 mg/dL — ABNORMAL LOW (ref 8.9–10.3)
Chloride: 106 mmol/L (ref 98–111)
Creatinine, Ser: 0.82 mg/dL (ref 0.61–1.24)
GFR, Estimated: >60 mL/min (ref >60)
Glucose, Bld: 100 mg/dL — ABNORMAL HIGH (ref 70–99)
Potassium: 4.1 mmol/L (ref 3.5–5.1)
Sodium: 138 mmol/L (ref 135–145)

## 2020-10-27 LAB — CULTURE, BLOOD (ROUTINE X 2)
Culture: NO GROWTH
Culture: NO GROWTH

## 2020-10-27 LAB — GLUCOSE, CAPILLARY: Glucose-Capillary: 189 mg/dL — ABNORMAL HIGH (ref 70–99)

## 2020-10-27 LAB — CBC
HCT: 39.4 % (ref 39.0–52.0)
Hemoglobin: 12.7 g/dL — ABNORMAL LOW (ref 13.0–17.0)
MCH: 28.3 pg (ref 26.0–34.0)
MCHC: 32.2 g/dL (ref 30.0–36.0)
MCV: 87.8 fL (ref 80.0–100.0)
Platelets: 238 10*3/uL (ref 150–400)
RBC: 4.49 MIL/uL (ref 4.22–5.81)
RDW: 13.5 % (ref 11.5–15.5)
WBC: 9.2 10*3/uL (ref 4.0–10.5)
nRBC: 0 % (ref 0.0–0.2)

## 2020-10-27 LAB — MAGNESIUM: Magnesium: 2 mg/dL (ref 1.7–2.4)

## 2020-10-27 MED ORDER — CIPROFLOXACIN HCL 500 MG PO TABS: 500.0000 mg | ORAL_TABLET | Freq: Two times a day (BID) | ORAL | 0 refills | Status: AC

## 2020-10-27 MED ORDER — HYDROMORPHONE HCL 4 MG PO TABS: 4.0000 mg | ORAL_TABLET | Freq: Four times a day (QID) | ORAL | 0 refills | Status: DC | PRN

## 2020-10-27 MED ORDER — DOXYCYCLINE MONOHYDRATE 100 MG PO TABS: 100.0000 mg | ORAL_TABLET | Freq: Two times a day (BID) | ORAL | 0 refills | Status: AC

## 2020-10-27 NOTE — Discharge Summary (Signed)
Physician Discharge Summary  Patient ID: Howard Murphy MRN: [id number] DOB/AGE: [birth year] 65 y.o.  Admit date: 10/21/2020 Discharge date: 10/27/2020  Admission Diagnoses:  Discharge Diagnoses:  Principal Problem:   Wound infection after surgery Active Problems:   GERD (gastroesophageal reflux disease)   Hyperlipidemia   Type 2 diabetes mellitus (HCC)   Benign prostatic hyperplasia   Low back pain   Discharged Condition: good  Hospital Course:  MOHD CLEMONS a 65 y.o.malewith medical history significant forlumbar spinal stenosis status post recent open T10-pelvis posterior spinal fusion and L1-L2 posterior spinal decompression,hyperlipidemia, type 2 diabetes mellitus,who is admitted to [facility] on5/13/2022with concern for thoracic/lumbar wound infection of midline incisional surgical siteafter presenting from home to [facility] ED complaining of incisional surgical site erythema.  In the setting of lumbar spinal stenosis, the patient underwent open T10-pelvis posterior spinal fusion and L1-L2 posterior spinal decompression on 09/14/2020 with Dr.Yarbrough ofneurosurgery.The patient reports that this occurred approximately 2 years after preceding lumbar spine surgery,also performed by Dr.Yarbrough.Patient reports approximately 2 weeks of progressive erythema, tenderness, swelling associated with thoracic/lumbar midline incisional surgical site.In the interval, the patient followed up in clinic with Dr. Izora Ribas on5/10/2020,at which time he conveys that he was not experiencing any significant drainage from the site.Conservative measures were elected,however, in the interval since this outpatient follow-up, the patient reports further progression of the incisional site erythema, swelling, tenderness as well as interval development of drainage, that has been intermittently purulent in nature.The patient denies any new numbness or paresthesias  involving the lower extremities, and denies any saddle anesthesia, new onset urinary retention or fecal incontinence. Denies any associated subjective fever, chills, rigors, or generalized myalgias. The setting of the above,the patient contactedDr Izora Ribas on5/13/2022, with ensuing instructions to present to [facility] ED for further evaluation and management of suspected incisional surgical site infection.    #1.  Wound infection in the thoracic/lumbar area surgical incision site. MRI showed a possible seromas, I&D was performed by Dr. Izora Ribas on 5/14, evidence of pus. Currently culture from I&D is still negative.  Patient was followed by ID,  on vancomycin and cefepime.  Will change to oral doxycycline and Cipro for 7 days per recommendation from ID. Patient was also taking pain medicine chronically, due to new spinal surgery, he will need better pain control.  I prescribed a few tablets of Dilaudid in the place of a Percocet.  Patient was advised not taking any Percocet or while on Dilaudid.  Patient be followed by his pain doctor in the near future.  2.  Type 2 diabetes. Continue home medicines.    Consults: ID and Neurosurgery.  Significant Diagnostic Studies:  MRI THORACIC AND LUMBAR SPINE WITHOUT AND WITH CONTRAST  TECHNIQUE: Multiplanar and multiecho pulse sequences of the thoracic and lumbar spine were obtained without and with intravenous contrast.  CONTRAST:  7.78mL GADAVIST GADOBUTROL 1 MMOL/ML IV SOLN  COMPARISON:  Prior CT from earlier the same day as well as previous studies.  FINDINGS: MRI THORACIC SPINE FINDINGS  Alignment: Trace retrolisthesis of T11 on T12. Underlying trace dextroscoliosis.  Vertebrae: Mild chronic wedging of T11 again noted, stable. Vertebral body height otherwise maintained without acute or subacute fracture. Susceptibility artifact related to prior posterior fusion extending from T11 through the sacrum. Hardware better evaluated  on prior CT. Underlying bone marrow signal intensity within normal limits. No worrisome osseous lesions. No findings to suggest osteomyelitis discitis or septic arthritis.  Cord: Evaluation of the distal cord limited by adjacent susceptibility effect.  Visualized portions are normal without signal abnormality. No epidural abscess or other collection.  Paraspinal and other soft tissues: Postoperative changes from recent fusion present within the lower posterior paraspinous soft tissues. Small collection measuring 5 x 8 x 21 mm seen along the midline incision at the level of T11 (series 22, image 34). No significant subfascial extension. Finding favored to reflect a small postoperative seroma. Superimposed infection not excluded, and could be considered in the correct clinical setting. No other loculated collections. Visualized lungs are grossly clear. Visualized visceral structures unremarkable.  Disc levels:  C7-T1: Small central disc protrusion indents the ventral thecal sac without significant spinal stenosis (series 22, image 2).  No other significant disc pathology seen within the thoracic spine. No spinal stenosis. She left-sided facet arthrosis at T5-6 through T8-9 with resultant moderate left foraminal stenosis at T5-6, with more mild narrowing at the remaining levels again noted. Mild right foraminal narrowing at T6-7 due to facet arthropathy.  MRI LUMBAR SPINE FINDINGS  Segmentation: Transitional lumbosacral anatomy with a lumbarized S1 segment. Vertebral body count is made from the dens, with same numbering system utilized as on previous exams.  Alignment: 3-4 mm retrolisthesis of L2 on L3, stable. Alignment otherwise normal with preservation of the normal lumbar lordosis.  Vertebrae: Extensive susceptibility artifact related to prior fusion at T11 through the sacrum. Interbody devices in place at L3-4, L4-5, and L5-S1. Hardware better evaluated on prior CT.  Vertebral body height maintained without acute fracture. Underlying bone marrow signal intensity within normal limits. No worrisome osseous lesions. Heterogeneous signal abnormality about the L2-3 interspace felt to be most consistent with reactive discogenic change. No other signal abnormality. No findings to suggest osteomyelitis discitis or septic arthritis.  Conus medullaris: Extends to the L1-2 level and appears normal. No epidural collections.  Paraspinal and other soft tissues: Extensive postoperative changes present throughout the lower posterior paraspinous musculature and soft tissues. Small collection at the right laminectomy site at L2-3 measures 2.5 x 2.4 cm (series 28, image 5). No significant regional mass effect or surrounding edema. Finding favored to reflect a benign postoperative seroma. No other discrete or loculated collections. Moderate distension of the partially visualized urinary bladder noted. Retroaortic left renal vein noted.  Disc levels:  L1-2: Seen only on sagittal projection. Minimal annular disc bulge. Prior posterior fusion. No stenosis.  L2-3: Retrolisthesis. Advanced degenerative intervertebral disc space narrowing with disc desiccation, diffuse disc bulge, and reactive endplate change, worse on the right. Prior posterior fusion with right laminectomy. No significant residual spinal stenosis. Foramina appear patent.  L3-4: Prior posterior and interbody fusion. Bilateral facet hypertrophy. No significant residual spinal stenosis. Foramina appear patent.  L4-5: Prior posterior and interbody fusion with posterior decompression. No residual spinal stenosis. Foramina appear patent.  L5-S1: Prior posterior and interbody fusion with posterior decompression. No residual canal or foraminal stenosis.  S1-2: Negative interspace. Prior posterior fusion. No residual spinal stenosis. Foramina appear patent.  IMPRESSION: 1. Postoperative  changes from prior fusion at T11 through the sacrum. Extensive postoperative changes throughout the posterior paraspinous soft tissues with superimposed small 5 x 8 x 21 mm collection along the midline incision at the level of T11. Finding favored to reflect a small postoperative seroma, although superimposed infection not excluded, and could be considered in the correct clinical setting. 2. Additional 2.5 x 2.4 cm collection along the right laminectomy site at L2-3, also favored to reflect a postoperative seroma. No significant mass effect. 3. No other evidence for acute infection within  the thoracic or lumbar spine. 4. Extensive postoperative changes from prior fusion at T11 through the sacrum. No residual or recurrent spinal stenosis.   Electronically Signed   By: Jeannine Boga M.D.   On: 10/22/2020 03:16   MRI THORACIC AND LUMBAR SPINE WITHOUT AND WITH CONTRAST  TECHNIQUE: Multiplanar and multiecho pulse sequences of the thoracic and lumbar spine were obtained without and with intravenous contrast.  CONTRAST:  7.38mL GADAVIST GADOBUTROL 1 MMOL/ML IV SOLN  COMPARISON:  Prior CT from earlier the same day as well as previous studies.  FINDINGS: MRI THORACIC SPINE FINDINGS  Alignment: Trace retrolisthesis of T11 on T12. Underlying trace dextroscoliosis.  Vertebrae: Mild chronic wedging of T11 again noted, stable. Vertebral body height otherwise maintained without acute or subacute fracture. Susceptibility artifact related to prior posterior fusion extending from T11 through the sacrum. Hardware better evaluated on prior CT. Underlying bone marrow signal intensity within normal limits. No worrisome osseous lesions. No findings to suggest osteomyelitis discitis or septic arthritis.  Cord: Evaluation of the distal cord limited by adjacent susceptibility effect. Visualized portions are normal without signal abnormality. No epidural abscess or other  collection.  Paraspinal and other soft tissues: Postoperative changes from recent fusion present within the lower posterior paraspinous soft tissues. Small collection measuring 5 x 8 x 21 mm seen along the midline incision at the level of T11 (series 22, image 34). No significant subfascial extension. Finding favored to reflect a small postoperative seroma. Superimposed infection not excluded, and could be considered in the correct clinical setting. No other loculated collections. Visualized lungs are grossly clear. Visualized visceral structures unremarkable.  Disc levels:  C7-T1: Small central disc protrusion indents the ventral thecal sac without significant spinal stenosis (series 22, image 2).  No other significant disc pathology seen within the thoracic spine. No spinal stenosis. She left-sided facet arthrosis at T5-6 through T8-9 with resultant moderate left foraminal stenosis at T5-6, with more mild narrowing at the remaining levels again noted. Mild right foraminal narrowing at T6-7 due to facet arthropathy.  MRI LUMBAR SPINE FINDINGS  Segmentation: Transitional lumbosacral anatomy with a lumbarized S1 segment. Vertebral body count is made from the dens, with same numbering system utilized as on previous exams.  Alignment: 3-4 mm retrolisthesis of L2 on L3, stable. Alignment otherwise normal with preservation of the normal lumbar lordosis.  Vertebrae: Extensive susceptibility artifact related to prior fusion at T11 through the sacrum. Interbody devices in place at L3-4, L4-5, and L5-S1. Hardware better evaluated on prior CT. Vertebral body height maintained without acute fracture. Underlying bone marrow signal intensity within normal limits. No worrisome osseous lesions. Heterogeneous signal abnormality about the L2-3 interspace felt to be most consistent with reactive discogenic change. No other signal abnormality. No findings to suggest osteomyelitis discitis  or septic arthritis.  Conus medullaris: Extends to the L1-2 level and appears normal. No epidural collections.  Paraspinal and other soft tissues: Extensive postoperative changes present throughout the lower posterior paraspinous musculature and soft tissues. Small collection at the right laminectomy site at L2-3 measures 2.5 x 2.4 cm (series 28, image 5). No significant regional mass effect or surrounding edema. Finding favored to reflect a benign postoperative seroma. No other discrete or loculated collections. Moderate distension of the partially visualized urinary bladder noted. Retroaortic left renal vein noted.  Disc levels:  L1-2: Seen only on sagittal projection. Minimal annular disc bulge. Prior posterior fusion. No stenosis.  L2-3: Retrolisthesis. Advanced degenerative intervertebral disc space narrowing with disc desiccation, diffuse disc  bulge, and reactive endplate change, worse on the right. Prior posterior fusion with right laminectomy. No significant residual spinal stenosis. Foramina appear patent.  L3-4: Prior posterior and interbody fusion. Bilateral facet hypertrophy. No significant residual spinal stenosis. Foramina appear patent.  L4-5: Prior posterior and interbody fusion with posterior decompression. No residual spinal stenosis. Foramina appear patent.  L5-S1: Prior posterior and interbody fusion with posterior decompression. No residual canal or foraminal stenosis.  S1-2: Negative interspace. Prior posterior fusion. No residual spinal stenosis. Foramina appear patent.  IMPRESSION: 1. Postoperative changes from prior fusion at T11 through the sacrum. Extensive postoperative changes throughout the posterior paraspinous soft tissues with superimposed small 5 x 8 x 21 mm collection along the midline incision at the level of T11. Finding favored to reflect a small postoperative seroma, although superimposed infection not excluded, and could  be considered in the correct clinical setting. 2. Additional 2.5 x 2.4 cm collection along the right laminectomy site at L2-3, also favored to reflect a postoperative seroma. No significant mass effect. 3. No other evidence for acute infection within the thoracic or lumbar spine. 4. Extensive postoperative changes from prior fusion at T11 through the sacrum. No residual or recurrent spinal stenosis.   Electronically Signed   By: Rise Mu M.D.   On: 10/22/2020 03:16   Treatments: Vancomycin and cefepime.  Discharge Exam: Blood pressure 134/73, pulse 81, temperature 98.4 F (36.9 C), resp. rate 20, height 5\' 7"  (1.702 m), weight 81.5 kg, SpO2 98 %. General appearance: alert and cooperative Resp: clear to auscultation bilaterally Cardio: regular rate and rhythm, S1, S2 normal, no murmur, click, rub or gallop GI: soft, non-tender; bowel sounds normal; no masses,  no organomegaly Extremities: extremities normal, atraumatic, no cyanosis or edema  Disposition: Discharge disposition: 01-Home or Self Care       Discharge Instructions    Diet - low sodium heart healthy   Complete by: As directed    Discharge wound care:   Complete by: As directed    Follow with neurosurgery   Increase activity slowly   Complete by: As directed      Allergies as of 10/27/2020      Reactions   Adhesive [tape] Other (See Comments)   Honeycomb dressing, blisters      Medication List    TAKE these medications   acetaminophen 500 MG tablet Commonly known as: TYLENOL Take 2 tablets (1,000 mg total) by mouth every 6 (six) hours.   atorvastatin 40 MG tablet Commonly known as: LIPITOR Take 1 tablet (40 mg total) by mouth at bedtime.   ciprofloxacin 500 MG tablet Commonly known as: Cipro Take 1 tablet (500 mg total) by mouth 2 (two) times daily for 7 days.   cyclobenzaprine 5 MG tablet Commonly known as: FLEXERIL Take by mouth.   doxycycline 100 MG tablet Commonly known  as: ADOXA Take 1 tablet (100 mg total) by mouth 2 (two) times daily for 7 days.   HYDROmorphone 4 MG tablet Commonly known as: DILAUDID Take 1-2 tablets (4-8 mg total) by mouth every 6 (six) hours as needed for severe pain. Do not take percocet when taking dilaudid What changed: additional instructions   Januvia 100 MG tablet Generic drug: sitaGLIPtin Take 100 mg by mouth daily.   Jardiance 25 MG Tabs tablet Generic drug: empagliflozin Take 25 mg by mouth daily.   lidocaine 5 % Commonly known as: LIDODERM Place 1 patch onto the skin daily. Remove & Discard patch within 12 hours or as  directed by MD   meclizine 25 MG tablet Commonly known as: ANTIVERT Take 25 mg by mouth every 6 (six) hours as needed for dizziness.   omeprazole 20 MG tablet Commonly known as: PriLOSEC OTC Take 1 tablet (20 mg total) by mouth daily for 20 days.   oxyCODONE-acetaminophen 10-325 MG tablet Commonly known as: Percocet Take 1 tablet by mouth every 6 (six) hours as needed for pain (Restart this only after you are out of Dilaudid). Restart this only after you are out of Dilaudid   senna-docusate 8.6-50 MG tablet Commonly known as: Senokot-S Take 1 tablet by mouth 2 (two) times daily.   tamsulosin 0.4 MG Caps capsule Commonly known as: FLOMAX Take 1 capsule (0.4 mg total) by mouth daily.   testosterone cypionate 200 MG/ML injection Commonly known as: DEPOTESTOSTERONE CYPIONATE Inject 200 mg into the muscle every 7 (seven) days.   UNABLE TO FIND Outpatient physical therapy   zolpidem 10 MG tablet Commonly known as: AMBIEN Take 10 mg by mouth at bedtime.            Discharge Care Instructions  (From admission, onward)         Start     Ordered   10/27/20 0000  Discharge wound care:       Comments: Follow with neurosurgery   10/27/20 0951          Follow-up Information    Center, [facility] Follow up in 1 week(s).   Specialty: General Practice Contact  information: [address] [place] [address] [phone]        Venetia Night, MD Follow up in 2 week(s).   Specialty: Neurosurgery Contact information: [address] [place] [address] [phone]              32 minutes Signed: Marrion Coy 10/27/2020, 9:57 AM

## 2020-10-27 NOTE — Progress Notes (Addendum)
Patient being discharged home, discharge instructions and documentation provided to patient, patient verbalized understanding. PIV removed, hemovac removed by provider. Patient waiting to be picked up.  Patient waiting for transport, wife waiting in private vehicle.

## 2020-10-27 NOTE — Progress Notes (Signed)
    Attending Progress Note 09/14/20 - Extension of Fusion to T10, L1-2 decompression 10/22/20 - washout and reclosure  History: Howard Murphy is here for possible wound infection after prior lumbar spine surgery.  POD5: No surgical issues  POD4: Doing well. Pain 7/10.  Drains put out little overnight.  POD3: Patient says drains uncomfortable but he has been getting up to bathroom and moving. ID saw patient and cultures no growth, remains on broad spectrum Abx. Drains with continued output  POD2: Patient with back pain today. Drains with minimal output (Drain #2 none). Cultures no growth to date. On broad specturm antibiotics  POD1: Feeling better than yesterday.  Pain control adequate.  Physical Exam: Vitals:   10/27/20 0122 10/27/20 0349  BP: 122/81 127/81  Pulse: 68 70  Resp:  18  Temp:  98.1 F (36.7 C)  SpO2:  99%    Awake, Alert Strength:5/5 throughout BLE Sensation intact throughout  Wound vac off. Incision c/d/i  Drain 3 removed  Data:  Recent Labs  Lab 10/25/20 0545 10/26/20 0520 10/27/20 0522  NA 137 138 138  K 3.4* 3.8 4.1  CL 105 107 106  CO2 25 26 26   BUN 14 13 16   CREATININE 0.67 0.69 0.82  GLUCOSE 140* 142* 100*  CALCIUM 8.5* 8.8* 8.7*   No results for input(s): AST, ALT, ALKPHOS in the last 168 hours.  Invalid input(s): TBILI   Recent Labs  Lab 10/25/20 0545 10/26/20 0520 10/27/20 0522  WBC 9.6 9.0 9.2  HGB 12.3* 12.9* 12.7*  HCT 37.4* 38.9* 39.4  PLT 205 227 238   No results for input(s): APTT, INR in the last 168 hours.       Other tests/results: Cultures NGTD  Assessment/Plan:  Howard Murphy is stable after washout and reclosure  - mobilize - pain control - DVT prophylaxis started - PTOT - Await culture results - appreciate ID consultation - continue abx pending culture results per ID - All drains removed.  He is now ok for discharge from my standpoint.  We will arrange follow up for wound care.  Meade Maw, MD Department of Neurosurgery

## 2020-10-28 LAB — AEROBIC/ANAEROBIC CULTURE W GRAM STAIN (SURGICAL/DEEP WOUND)
Culture: NO GROWTH
Culture: NO GROWTH
Gram Stain: NONE SEEN
Gram Stain: NONE SEEN

## 2020-11-20 NOTE — Progress Notes (Signed)
PROVIDER NOTE: Information contained herein reflects review and annotations entered in association with encounter. Interpretation of such information and data should be left to medically-trained personnel. Information provided to patient can be located elsewhere in the medical record under "Patient Instructions". Document created using STT-dictation technology, any transcriptional errors that may result from process are unintentional.    Patient: Howard Murphy  Service Category: E/M  Provider: Gaspar Cola, MD  DOB: Aug 31, 1955  DOS: 11/23/2020  Specialty: Interventional Pain Management  MRN: [id number]  Setting: Ambulatory outpatient  PCP: [facility]  Type: Established Patient    Referring Provider: [facility]  Location: Office  Delivery: Face-to-face     HPI  Howard Murphy, a 65 y.o. year old male, is here today because of his Chronic pain syndrome [G89.4]. Howard Murphy primary complain today is Back Pain Last encounter: My last encounter with him was on 10/24/2020. Pertinent problems: Howard Murphy has History of lumbar facet Synovial cyst, surgically removed.; Chronic abdominal pain (RUQ); Type 2 diabetes mellitus ([place]); Spondylosis of lumbar spine; Lumbar facet joint syndrome ([place]); Chronic hip pain (Left); Diabetic peripheral neuropathy ([place]); Chronic lumbar radicular pain (S1 Dermatome) (Bilateral) (L>R); Complete tear of the shoulder rotator cuff (Left); Chronic shoulder pain (2ry area of Pain) (Bilateral) (L>R); Chronic foot pain (3ry area of Pain) (Bilateral) (L>R); Chronic hand pain (4th area of Pain) (Bilateral) (R>L); Lumbar facet hypertrophy; Occipital pain (Right); Failed back surgical syndrome 4; Chronic low back pain (1ry area of Pain) (Bilateral) (L>R) w/o sciatica; Radicular pain of shoulder (Right); Carpal tunnel syndrome (Bilateral); Chronic cervical radicular pain (Right); Abnormal MRI, lumbar spine (12/23/2019); Chronic sacroiliac joint  pain (Bilateral) (L>R); Neurogenic pain; Cervical spondylosis; Cervical facet arthropathy; Chronic neck pain (Bilateral) (R>L); Cervical facet syndrome (Bilateral) (R>L); Chronic upper extremity pain (Bilateral) (R>L); Cervical foraminal stenosis;  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, C5-6); Arthropathy of shoulder (Right); 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); Lumbar foraminal stenosis  (Left: L4-5) (Right: L1-2); Grade 1 Anterolisthesis (2 mm) of L4 over L5.; Lumbar facet (L4-5) synovial cyst (6 mm) (Left); Chronic pain syndrome; Spondylolisthesis at L3-L4 level; Complete tear of left rotator cuff; Bertolotti's syndrome (L5-S1) (Bilateral); 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); Other specified dorsopathies, sacral and sacrococcygeal region; Spondylosis without myelopathy or radiculopathy, lumbosacral region; Chronic bilateral low back pain with bilateral sciatica; Neurogenic claudication ([place]); Osteoarthritis involving multiple joints; Chronic musculoskeletal pain; Acute exacerbation of chronic low back pain; Grade 1 Retrolisthesis of L1/L2; Lumbar disc extrusion with 7 mm caudal migration (L1-2); Lumbosacral radiculopathy at L1 (Right); Groin pain (Right); 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 Pain Assessment: Severity of Chronic pain is reported as a 7 /10. Location: Back Left, Right/Denies. Onset:  . Quality: Sharp, Stabbing, Throbbing, Aching. Timing:  . Modifying factor(s): nothing. Vitals:  height is _0  (1.702 m) and weight is 187 lb (84.8 kg). His temperature is 97 F (36.1 C) (abnormal). His blood pressure is 113/89 and his pulse is 71. His respiration is 15 and oxygen saturation is 94%.   Reason for encounter: medication  management.   The patient indicates doing well with the current medication regimen. No adverse reactions or side effects reported to the medications.   PMP revealed that the patient obtain hydromorphone 4 mg tablets from Dr. Cari Caraway on 09/17/2020,  from Domenic Polite, MD (hospitalist at [facility] in [place]) on 09/27/2020, and from Sharen Hones, MD (internal medicine) on 02/27/2021, in addition to the oxycodone/APAP 10/325 that he takes regularly from our practice.  His last medication refill was on 10/28/2020.  The patient had an open T10 through pelvis posterior spinal fusion by Dr. Cari Caraway on 09/14/2020.  On 10/22/2020 he underwent an incision and drainage of a posterior lumbar wound infection.  On 08/29/2020 I had talked to this patient about his medications and we had agreed that after the back surgery from Dr. Cari Caraway, we would begin a downward taper of his opioids for the purpose of a drug holiday.  Today he comes in indicating that he feels he needs an increase since the medication does not seem to be lasting 8 hours.  Today I had a conversation with the patient regarding this and due to the fact that he did have some complications with a wound infection, and will allow him to use the medication every 6 hours for the next 41 days, but after that we will resume the plan stated on 08/29/2020.  His pill count today is 12/90 from the last prescription I have given him.  RTCB: 03/04/2021 Nonopioids transferred 03/16/2021: Melatonin  Pharmacotherapy Assessment  Analgesic: Oxycodone IR 10 mg 1 tab PO q 8 hrs (30 mg/day of oxycodone) MME/day: 45 mg/day.   Monitoring: [place] PMP: PDMP reviewed during this encounter.       Pharmacotherapy: No side-effects or adverse reactions reported. Compliance: No problems identified. Effectiveness: Clinically acceptable.  Chauncey Fischer, RN  11/23/2020  9:22 AM  Sign when Signing Visit   Nursing Pain Medication Assessment:  Safety precautions to be  maintained throughout the outpatient stay will include: orient to surroundings, keep bed in low position, maintain call bell within reach at all times, provide assistance with transfer out of bed and ambulation.  Medication Inspection Compliance: Pill count conducted under aseptic conditions, in front of the patient. Neither the pills nor the bottle was removed from the patient's sight at any time. Once count was completed pills were immediately returned to the patient in their original bottle.  Medication: Oxycodone/APAP Pill/Patch Count:  12 of 90 pills remain Pill/Patch Appearance: Markings consistent with prescribed medication Bottle Appearance: Standard pharmacy container. Clearly labeled. Filled Date: 5 / 20 / 2022 Last Medication intake:  Today    UDS:  Summary  Date Value Ref Range Status  12/02/2019 Note  Final    Comment:    ==================================================================== [employer] Select 13 (MW) ==================================================================== Test                             Result       Flag       Units  Drug Present and Declared for Prescription Verification   Oxycodone                      6169         EXPECTED   ng/mg creat   Oxymorphone                    2086         EXPECTED   ng/mg creat   Noroxycodone                   4079         EXPECTED   ng/mg creat   Noroxymorphone  645          EXPECTED   ng/mg creat    Sources of oxycodone are scheduled prescription medications.    Oxymorphone, noroxycodone, and noroxymorphone are expected    metabolites of oxycodone. Oxymorphone is also available as a    scheduled prescription medication.  ==================================================================== Test                      Result    Flag   Units      Ref Range   Creatinine              141              mg/dL      >=20 ==================================================================== Declared  Medications:  The flagging and interpretation on this report are based on the  following declared medications.  Unexpected results may arise from  inaccuracies in the declared medications.   **Note: The testing scope of this panel includes these medications:   Oxycodone   **Note: The testing scope of this panel does not include the  following reported medications:   Albuterol (Ventolin HFA)  Atorvastatin (Lipitor)  Canagliflozin (Invokana)  Chlorthalidone (Hygroton)  Lisinopril (Zestril)  Meclizine (Antivert)  Melatonin  Ondansetron (Zofran)  Potassium (Klor-Con)  Tamsulosin (Flomax)  Testosterone  Vitamin D ==================================================================== For clinical consultation, please call [phone]. ====================================================================      ROS  Constitutional: Denies any fever or chills Gastrointestinal: No reported hemesis, hematochezia, vomiting, or acute GI distress Musculoskeletal: Denies any acute onset joint swelling, redness, loss of ROM, or weakness Neurological: No reported episodes of acute onset apraxia, aphasia, dysarthria, agnosia, amnesia, paralysis, loss of coordination, or loss of consciousness  Medication Review  UNABLE TO FIND, acetaminophen, atorvastatin, cyclobenzaprine, empagliflozin, meclizine, omeprazole, oxyCODONE-acetaminophen, senna-docusate, sitaGLIPtin, tamsulosin, testosterone cypionate, and zolpidem  History Review  Allergy: Howard Murphy is allergic to adhesive [tape]. Drug: Howard Murphy  reports no history of drug use. Alcohol:  reports no history of alcohol use. Tobacco:  reports that he has never smoked. He quit smokeless tobacco use about 7 years ago.  His smokeless tobacco use included chew. Social: Howard Murphy  reports that he has never smoked. He quit smokeless tobacco use about 7 years ago.  His smokeless tobacco use included chew. He reports that he does not drink alcohol  and does not use drugs. Medical:  has a past medical history of Arthritis, Back ache (05/05/2013), Diabetes mellitus without complication ([place]), GERD (gastroesophageal reflux disease), Headache, History of kidney stones, Hyperlipemia, Hypertension, Kidney stones, Kidney stones, Nephrolithiasis, and Shortness of breath dyspnea. Surgical: Howard Murphy  has a past surgical history that includes Cholecystectomy (2000); Shoulder surgery (Right, 2016); Colonoscopy with propofol (N/A, 04/11/2015); Esophagogastroduodenoscopy (egd) with propofol (N/A, 04/11/2015); Shoulder surgery (Left, 06/2015); Back surgery (2015); Back surgery (08/09/2016); Cystoscopy with ureteroscopy and stent placement (Right, 08/27/2017); laparoscopic appendectomy (N/A, 12/18/2017); Appendectomy; Back surgery (Bilateral, 2019); Posterior lumbar fusion 4 level (N/A, 09/14/2020); and Incision and drainage (N/A, 10/22/2020). Family: family history includes Cancer in his maternal grandmother, mother, sister, and sister; Heart disease in his father; Stroke in his father.  Laboratory Chemistry Profile   Renal Lab Results  Component Value Date   BUN 16 10/27/2020   CREATININE 0.82 10/27/2020   LABCREA 182 02/24/2016   BCR 14 03/07/2017   GFRAA >60 12/22/2019   GFRNONAA >60 10/27/2020     Hepatic Lab Results  Component Value Date   AST 29 12/22/2019   ALT 36 12/22/2019  ALBUMIN 4.3 12/22/2019   ALKPHOS 68 12/22/2019   LIPASE 46 12/03/2018     Electrolytes Lab Results  Component Value Date   NA 138 10/27/2020   K 4.1 10/27/2020   CL 106 10/27/2020   CALCIUM 8.7 (L) 10/27/2020   MG 2.0 10/27/2020     Bone Lab Results  Component Value Date   25OHVITD1 24 (L) 11/29/2016   25OHVITD2 <1.0 11/29/2016   25OHVITD3 24 11/29/2016     Inflammation (CRP: Acute Phase) (ESR: Chronic Phase) Lab Results  Component Value Date   CRP 2.4 (H) 10/22/2020   ESRSEDRATE 10 10/22/2020   LATICACIDVEN 1.0 09/25/2020       Note: Above Lab  results reviewed.  Recent Imaging Review  MR THORACIC SPINE W WO CONTRAST CLINICAL DATA:  Initial evaluation for acute back pain, concern for midline wound infection, redness and drainage.  EXAM: MRI THORACIC AND LUMBAR SPINE WITHOUT AND WITH CONTRAST  TECHNIQUE: Multiplanar and multiecho pulse sequences of the thoracic and lumbar spine were obtained without and with intravenous contrast.  CONTRAST:  7.41m GADAVIST GADOBUTROL 1 MMOL/ML IV SOLN  COMPARISON:  Prior CT from earlier the same day as well as previous studies.  FINDINGS: MRI THORACIC SPINE FINDINGS  Alignment: Trace retrolisthesis of T11 on T12. Underlying trace dextroscoliosis.  Vertebrae: Mild chronic wedging of T11 again noted, stable. Vertebral body height otherwise maintained without acute or subacute fracture. Susceptibility artifact related to prior posterior fusion extending from T11 through the sacrum. Hardware better evaluated on prior CT. Underlying bone marrow signal intensity within normal limits. No worrisome osseous lesions. No findings to suggest osteomyelitis discitis or septic arthritis.  Cord: Evaluation of the distal cord limited by adjacent susceptibility effect. Visualized portions are normal without signal abnormality. No epidural abscess or other collection.  Paraspinal and other soft tissues: Postoperative changes from recent fusion present within the lower posterior paraspinous soft tissues. Small collection measuring 5 x 8 x 21 mm seen along the midline incision at the level of T11 (series 22, image 34). No significant subfascial extension. Finding favored to reflect a small postoperative seroma. Superimposed infection not excluded, and could be considered in the correct clinical setting. No other loculated collections. Visualized lungs are grossly clear. Visualized visceral structures unremarkable.  Disc levels:  C7-T1: Small central disc protrusion indents the ventral thecal  sac without significant spinal stenosis (series 22, image 2).  No other significant disc pathology seen within the thoracic spine. No spinal stenosis. She left-sided facet arthrosis at T5-6 through T8-9 with resultant moderate left foraminal stenosis at T5-6, with more mild narrowing at the remaining levels again noted. Mild right foraminal narrowing at T6-7 due to facet arthropathy.  MRI LUMBAR SPINE FINDINGS  Segmentation: Transitional lumbosacral anatomy with a lumbarized S1 segment. Vertebral body count is made from the dens, with same numbering system utilized as on previous exams.  Alignment: 3-4 mm retrolisthesis of L2 on L3, stable. Alignment otherwise normal with preservation of the normal lumbar lordosis.  Vertebrae: Extensive susceptibility artifact related to prior fusion at T11 through the sacrum. Interbody devices in place at L3-4, L4-5, and L5-S1. Hardware better evaluated on prior CT. Vertebral body height maintained without acute fracture. Underlying bone marrow signal intensity within normal limits. No worrisome osseous lesions. Heterogeneous signal abnormality about the L2-3 interspace felt to be most consistent with reactive discogenic change. No other signal abnormality. No findings to suggest osteomyelitis discitis or septic arthritis.  Conus medullaris: Extends to the L1-2 level and appears  normal. No epidural collections.  Paraspinal and other soft tissues: Extensive postoperative changes present throughout the lower posterior paraspinous musculature and soft tissues. Small collection at the right laminectomy site at L2-3 measures 2.5 x 2.4 cm (series 28, image 5). No significant regional mass effect or surrounding edema. Finding favored to reflect a benign postoperative seroma. No other discrete or loculated collections. Moderate distension of the partially visualized urinary bladder noted. Retroaortic left renal vein noted.  Disc levels:  L1-2: Seen  only on sagittal projection. Minimal annular disc bulge. Prior posterior fusion. No stenosis.  L2-3: Retrolisthesis. Advanced degenerative intervertebral disc space narrowing with disc desiccation, diffuse disc bulge, and reactive endplate change, worse on the right. Prior posterior fusion with right laminectomy. No significant residual spinal stenosis. Foramina appear patent.  L3-4: Prior posterior and interbody fusion. Bilateral facet hypertrophy. No significant residual spinal stenosis. Foramina appear patent.  L4-5: Prior posterior and interbody fusion with posterior decompression. No residual spinal stenosis. Foramina appear patent.  L5-S1: Prior posterior and interbody fusion with posterior decompression. No residual canal or foraminal stenosis.  S1-2: Negative interspace. Prior posterior fusion. No residual spinal stenosis. Foramina appear patent.  IMPRESSION: 1. Postoperative changes from prior fusion at T11 through the sacrum. Extensive postoperative changes throughout the posterior paraspinous soft tissues with superimposed small 5 x 8 x 21 mm collection along the midline incision at the level of T11. Finding favored to reflect a small postoperative seroma, although superimposed infection not excluded, and could be considered in the correct clinical setting. 2. Additional 2.5 x 2.4 cm collection along the right laminectomy site at L2-3, also favored to reflect a postoperative seroma. No significant mass effect. 3. No other evidence for acute infection within the thoracic or lumbar spine. 4. Extensive postoperative changes from prior fusion at T11 through the sacrum. No residual or recurrent spinal stenosis.  Electronically Signed   By: Jeannine Boga M.D.   On: 10/22/2020 03:16 MR Lumbar Spine W Wo Contrast CLINICAL DATA:  Initial evaluation for acute back pain, concern for midline wound infection, redness and drainage.  EXAM: MRI THORACIC AND LUMBAR  SPINE WITHOUT AND WITH CONTRAST  TECHNIQUE: Multiplanar and multiecho pulse sequences of the thoracic and lumbar spine were obtained without and with intravenous contrast.  CONTRAST:  7.67m GADAVIST GADOBUTROL 1 MMOL/ML IV SOLN  COMPARISON:  Prior CT from earlier the same day as well as previous studies.  FINDINGS: MRI THORACIC SPINE FINDINGS  Alignment: Trace retrolisthesis of T11 on T12. Underlying trace dextroscoliosis.  Vertebrae: Mild chronic wedging of T11 again noted, stable. Vertebral body height otherwise maintained without acute or subacute fracture. Susceptibility artifact related to prior posterior fusion extending from T11 through the sacrum. Hardware better evaluated on prior CT. Underlying bone marrow signal intensity within normal limits. No worrisome osseous lesions. No findings to suggest osteomyelitis discitis or septic arthritis.  Cord: Evaluation of the distal cord limited by adjacent susceptibility effect. Visualized portions are normal without signal abnormality. No epidural abscess or other collection.  Paraspinal and other soft tissues: Postoperative changes from recent fusion present within the lower posterior paraspinous soft tissues. Small collection measuring 5 x 8 x 21 mm seen along the midline incision at the level of T11 (series 22, image 34). No significant subfascial extension. Finding favored to reflect a small postoperative seroma. Superimposed infection not excluded, and could be considered in the correct clinical setting. No other loculated collections. Visualized lungs are grossly clear. Visualized visceral structures unremarkable.  Disc levels:  C7-T1:  Small central disc protrusion indents the ventral thecal sac without significant spinal stenosis (series 22, image 2).  No other significant disc pathology seen within the thoracic spine. No spinal stenosis. She left-sided facet arthrosis at T5-6 through T8-9 with resultant moderate  left foraminal stenosis at T5-6, with more mild narrowing at the remaining levels again noted. Mild right foraminal narrowing at T6-7 due to facet arthropathy.  MRI LUMBAR SPINE FINDINGS  Segmentation: Transitional lumbosacral anatomy with a lumbarized S1 segment. Vertebral body count is made from the dens, with same numbering system utilized as on previous exams.  Alignment: 3-4 mm retrolisthesis of L2 on L3, stable. Alignment otherwise normal with preservation of the normal lumbar lordosis.  Vertebrae: Extensive susceptibility artifact related to prior fusion at T11 through the sacrum. Interbody devices in place at L3-4, L4-5, and L5-S1. Hardware better evaluated on prior CT. Vertebral body height maintained without acute fracture. Underlying bone marrow signal intensity within normal limits. No worrisome osseous lesions. Heterogeneous signal abnormality about the L2-3 interspace felt to be most consistent with reactive discogenic change. No other signal abnormality. No findings to suggest osteomyelitis discitis or septic arthritis.  Conus medullaris: Extends to the L1-2 level and appears normal. No epidural collections.  Paraspinal and other soft tissues: Extensive postoperative changes present throughout the lower posterior paraspinous musculature and soft tissues. Small collection at the right laminectomy site at L2-3 measures 2.5 x 2.4 cm (series 28, image 5). No significant regional mass effect or surrounding edema. Finding favored to reflect a benign postoperative seroma. No other discrete or loculated collections. Moderate distension of the partially visualized urinary bladder noted. Retroaortic left renal vein noted.  Disc levels:  L1-2: Seen only on sagittal projection. Minimal annular disc bulge. Prior posterior fusion. No stenosis.  L2-3: Retrolisthesis. Advanced degenerative intervertebral disc space narrowing with disc desiccation, diffuse disc bulge,  and reactive endplate change, worse on the right. Prior posterior fusion with right laminectomy. No significant residual spinal stenosis. Foramina appear patent.  L3-4: Prior posterior and interbody fusion. Bilateral facet hypertrophy. No significant residual spinal stenosis. Foramina appear patent.  L4-5: Prior posterior and interbody fusion with posterior decompression. No residual spinal stenosis. Foramina appear patent.  L5-S1: Prior posterior and interbody fusion with posterior decompression. No residual canal or foraminal stenosis.  S1-2: Negative interspace. Prior posterior fusion. No residual spinal stenosis. Foramina appear patent.  IMPRESSION: 1. Postoperative changes from prior fusion at T11 through the sacrum. Extensive postoperative changes throughout the posterior paraspinous soft tissues with superimposed small 5 x 8 x 21 mm collection along the midline incision at the level of T11. Finding favored to reflect a small postoperative seroma, although superimposed infection not excluded, and could be considered in the correct clinical setting. 2. Additional 2.5 x 2.4 cm collection along the right laminectomy site at L2-3, also favored to reflect a postoperative seroma. No significant mass effect. 3. No other evidence for acute infection within the thoracic or lumbar spine. 4. Extensive postoperative changes from prior fusion at T11 through the sacrum. No residual or recurrent spinal stenosis.  Electronically Signed   By: Jeannine Boga M.D.   On: 10/22/2020 03:16 CT Thoracic Spine Wo Contrast CLINICAL DATA:  Initial evaluation for low back pain, infection or inflammation suspected.  EXAM: CT THORACIC AND LUMBAR SPINE WITHOUT CONTRAST  TECHNIQUE: Multidetector CT imaging of the thoracic and lumbar spine was performed without contrast. Multiplanar CT image reconstructions were also generated.  COMPARISON:  Prior study from 09/26/2020.  FINDINGS: CT  THORACIC SPINE FINDINGS  Alignment: Trace retrolisthesis of T11 on T12, stable. Alignment otherwise normal.  Vertebrae: No acute or interval fracture. No discrete or worrisome osseous lesions. Mild chronic anterior wedging of the T11 vertebral body, stable. Posterior fusion extending from T11 through the sacrum. Hardware remains well positioned without evidence for loosening or other complication.  Paraspinal and other soft tissues: Mild postoperative stranding present within the posterior paraspinous soft tissues extending from T8-9 inferiorly. No discrete collections evident by CT. Remainder of the paraspinous soft tissues otherwise unremarkable. Partially visualized lungs are clear.  Disc levels: Anterior bridging osteophytes extending from T7 through L1. No new or progressive spinal stenosis. Left-sided facet arthrosis at T5-6-T8-9 with resultant moderate left foraminal stenosis at T5-6, with more mild narrowing at the remaining levels. Mild right foraminal narrowing at T6-7 due to facet arthrosis. Appearance is stable.  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 Same numbering system employed as on previous exam.  Alignment: Mild retrolisthesis of L2 on L3, stable. Alignment otherwise normal.  Vertebrae: Posterior fusion extending from T11 through the sacrum. Hardware in stable position and is well aligned without loosening or other complication. Sequelae of prior screw removal on the right again noted at L5. Bilateral iliac screws remain in place traversing both SI joints. Interbody implants in place at L3-4, L4-5, and L5-S1, stable in position. Vertebral body height maintained without fracture. No discrete or worrisome osseous lesions.  Paraspinal and other soft tissues: Postoperative changes within the posterior paraspinous soft tissues with expected postoperative stranding. No visible collections evident by  CT. Aortic atherosclerosis noted.  Disc levels:  L1-2: Posterior fusion. Residual disc bulge with facet hypertrophy without significant stenosis.  L2-3: Advanced degenerative intervertebral disc space narrowing with disc bulge, disc desiccation, and reactive endplate change, worse on the right. Associated degenerative sclerosis. Prior right laminectomy. Residual bilateral facet arthrosis. No spinal stenosis. Mild bilateral neural foraminal stenosis due to spurring, stable.  L3-4 through S1-2. Previous fusion with similar appearance to previous exam. No significant residual stenosis.  IMPRESSION: 1. Postoperative changes from prior posterior fusion from T11 through the sacrum, stable from previous without complication. Similar postoperative changes within the posterior paraspinous soft tissues without visible collection by CT. 2. 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 3. Aortic Atherosclerosis (ICD10-I70.0).  Electronically Signed   By: Jeannine Boga M.D.   On: 10/22/2020 01:09 CT Lumbar Spine Wo Contrast CLINICAL DATA:  Initial evaluation for low back pain, infection or inflammation suspected.  EXAM: CT THORACIC AND LUMBAR SPINE WITHOUT CONTRAST  TECHNIQUE: Multidetector CT imaging of the thoracic and lumbar spine was performed without contrast. Multiplanar CT image reconstructions were also generated.  COMPARISON:  Prior study from 09/26/2020.  FINDINGS: CT THORACIC SPINE FINDINGS  Alignment: Trace retrolisthesis of T11 on T12, stable. Alignment otherwise normal.  Vertebrae: No acute or interval fracture. No discrete or worrisome osseous lesions. Mild chronic anterior wedging of the T11 vertebral body, stable. Posterior fusion extending from T11 through the sacrum. Hardware remains well positioned without evidence for loosening or other complication.  Paraspinal and other soft tissues: Mild  postoperative stranding present within the posterior paraspinous soft tissues extending from T8-9 inferiorly. No discrete collections evident by CT. Remainder of the paraspinous soft tissues otherwise unremarkable. Partially visualized lungs are clear.  Disc levels: Anterior bridging osteophytes extending from T7 through L1. No new or progressive spinal stenosis. Left-sided facet arthrosis at T5-6-T8-9 with resultant moderate  left foraminal stenosis at T5-6, with more mild narrowing at the remaining levels. Mild right foraminal narrowing at T6-7 due to facet arthrosis. Appearance is stable.  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 Same numbering system employed as on previous exam.  Alignment: Mild retrolisthesis of L2 on L3, stable. Alignment otherwise normal.  Vertebrae: Posterior fusion extending from T11 through the sacrum. Hardware in stable position and is well aligned without loosening or other complication. Sequelae of prior screw removal on the right again noted at L5. Bilateral iliac screws remain in place traversing both SI joints. Interbody implants in place at L3-4, L4-5, and L5-S1, stable in position. Vertebral body height maintained without fracture. No discrete or worrisome osseous lesions.  Paraspinal and other soft tissues: Postoperative changes within the posterior paraspinous soft tissues with expected postoperative stranding. No visible collections evident by CT. Aortic atherosclerosis noted.  Disc levels:  L1-2: Posterior fusion. Residual disc bulge with facet hypertrophy without significant stenosis.  L2-3: Advanced degenerative intervertebral disc space narrowing with disc bulge, disc desiccation, and reactive endplate change, worse on the right. Associated degenerative sclerosis. Prior right laminectomy. Residual bilateral facet arthrosis. No spinal stenosis. Mild bilateral neural  foraminal stenosis due to spurring, stable.  L3-4 through S1-2. Previous fusion with similar appearance to previous exam. No significant residual stenosis.  IMPRESSION: 1. Postoperative changes from prior posterior fusion from T11 through the sacrum, stable from previous without complication. Similar postoperative changes within the posterior paraspinous soft tissues without visible collection by CT. 2. 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 3. Aortic Atherosclerosis (ICD10-I70.0).  Electronically Signed   By: Jeannine Boga M.D.   On: 10/22/2020 01:09 Note: Reviewed        Physical Exam  General appearance: Well nourished, well developed, and well hydrated. In no apparent acute distress Mental status: Alert, oriented x 3 (person, place, & time)       Respiratory: No evidence of acute respiratory distress Eyes: PERLA Vitals: BP 113/89   Pulse 71   Temp (!) 97 F (36.1 C)   Resp 15   Ht _0  (1.702 m)   Wt 187 lb (84.8 kg)   SpO2 94%   BMI 29.29 kg/m  BMI: Estimated body mass index is 29.29 kg/m as calculated from the following:   Height as of this encounter: _1  (1.702 m).   Weight as of this encounter: 187 lb (84.8 kg). Ideal: Ideal body weight: 66.1 kg (145 lb 11.6 oz) Adjusted ideal body weight: 73.6 kg (162 lb 3.8 oz)  Assessment   Status Diagnosis  Controlled Controlled Controlled 1. Chronic pain syndrome   2. Chronic low back pain (1ry area of Pain) (Bilateral) (L>R) w/o sciatica   3. Chronic shoulder pain (2ry area of Pain) (Bilateral) (L>R)   4. Chronic foot pain (3ry area of Pain) (Bilateral) (L>R)   5. Chronic hand pain (4th area of Pain) (Bilateral) (R>L)   6. Pharmacologic therapy   7. Chronic use of opiate for therapeutic purpose   8. 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 Wound infection following a procedure, other surgical site, sequela      Updated Problems: Problem   Wound infection following a procedure, other surgical site, sequela   Surgical drainage by Dr. Cari Caraway on 10/22/2020.    Status post lumbar spinal fusion (09/14/2020)  Wound Infection After Surgery (Resolved)    Plan of Care  Problem-specific:  No problem-specific Assessment & Plan notes found for this encounter.  Howard Murphy has a current medication list which includes the following long-term medication(s): atorvastatin, [START ON 12/04/2020] oxycodone-acetaminophen, [START ON 01/03/2021] oxycodone-acetaminophen, [START ON 02/02/2021] oxycodone-acetaminophen, oxycodone-acetaminophen, zolpidem, and omeprazole.  Pharmacotherapy (Medications Ordered): Meds ordered this encounter  Medications   oxyCODONE-acetaminophen (PERCOCET) 10-325 MG tablet    Sig: Take 1 tablet by mouth every 6 (six) hours as needed for pain. Must last 30 days.    Dispense:  120 tablet    Refill:  0    Not a duplicate. Do NOT delete! Dispense 1 day early if closed on refill date. Avoid benzodiazepines within 8 hours of opioids. Do not send refill requests.   oxyCODONE-acetaminophen (PERCOCET) 10-325 MG tablet    Sig: Take 1 tablet by mouth every 8 (eight) hours as needed for pain. Must last 30 days.    Dispense:  90 tablet    Refill:  0    Not a duplicate. Do NOT delete! Dispense 1 day early if closed on refill date. Avoid benzodiazepines within 8 hours of opioids. Do not send refill requests.   oxyCODONE-acetaminophen (PERCOCET) 10-325 MG tablet    Sig: Take 1 tablet by mouth every 8 (eight) hours as needed for pain. Must last 30 days.    Dispense:  90 tablet    Refill:  0    Not a duplicate. Do NOT delete! Dispense 1 day early if closed on refill date. Avoid benzodiazepines within 8 hours of opioids. Do not send refill requests.   oxyCODONE-acetaminophen (PERCOCET) 10-325 MG tablet    Sig: Take 1 tablet by mouth every 6 (six) hours as needed for up to 11 days for pain. Must last 30 days.    Dispense:  44  tablet    Refill:  0    Not a duplicate. Do NOT delete! Dispense 1 day early if closed on refill date. Avoid benzodiazepines within 8 hours of opioids. Do not send refill requests.   ketorolac (TORADOL) injection 60 mg   orphenadrine (NORFLEX) injection 60 mg    Orders:  Orders Placed This Encounter  Procedures   ToxASSURE Select 13 (MW), Urine    Volume: 30 ml(s). Minimum 3 ml of urine is needed. Document temperature of fresh sample. Indications: Long term (current) use of opiate analgesic (O87.867)    Order Specific Question:   Release to patient    Answer:   Immediate    Follow-up plan:   Return in about 3 months (around 03/04/2021) for evaluation day (F2F) (MM).     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   Considering:   Diagnostic bilateral cervical facet block  Possible bilateral cervical facet RFA  Diagnostic right greater occipital NB  Possible right GON RFA  Possible right GON peripheral nerve stimulator trial  Diagnostic right CESI  Diagnostic bilateral IA shoulder injection  Diagnostic bilateral suprascapular NB  Possible bilateral suprascapular nerve RFA  Diagnostic bilateral SI joint block  Possible bilateral SI joint RFA  Diagnostic bilateral lumbar facet block  Possible bilateral lumbar facet RFA  Diagnostic left L4 TFESI  Diagnostic left L4-5 LESI  Diagnostic left IA hip joint injection  Possible left hip joint RFA  Diagnostic bilateral median nerve block (carpal tunnel block)  Diagnostic right CPB    Palliative PRN treatment(s):   Palliative/Diagnostic left SI Block (Area of  L5 Transverse process spatulation) #2  Palliative IM Toradol/Norflex 60/60 (PRN)        Recent Visits Date Type Provider Dept  08/29/20 Office Visit Milinda Pointer, MD [facility]  Showing recent visits within past 90 days and meeting all other requirements Today's Visits Date Type Provider Dept  11/23/20 Office Visit Milinda Pointer, MD [facility]  Showing today's visits and meeting all other requirements Future Appointments No visits were found meeting these conditions. Showing future appointments within next 90 days and meeting all other requirements I discussed the assessment and treatment plan with the patient. The patient was provided an opportunity to ask questions and all were answered. The patient agreed with the plan and demonstrated an understanding of the instructions.  Patient advised to call back or seek an in-person evaluation if the symptoms or condition worsens.  Duration of encounter: 30 minutes.  Note by: Gaspar Cola, MD Date: 11/23/2020; Time: 10:36 AM

## 2020-11-22 DIAGNOSIS — Z79891 Long term (current) use of opiate analgesic: Secondary | ICD-10-CM | POA: Insufficient documentation

## 2020-11-22 NOTE — Patient Instructions (Signed)
____________________________________________________________________________________________  Medication Rules  Purpose: To inform patients, and their family members, of our rules and regulations.  Applies to: All patients receiving prescriptions (written or electronic).  Pharmacy of record: Pharmacy where electronic prescriptions will be sent. If written prescriptions are taken to a different pharmacy, please inform the nursing staff. The pharmacy listed in the electronic medical record should be the one where you would like electronic prescriptions to be sent.  Electronic prescriptions: In compliance with the [place] Strengthen Opioid Misuse Prevention (STOP) Act of 2017 (Session Law 2017-74/H243), effective June 11, 2018, all controlled substances must be electronically prescribed. Calling prescriptions to the pharmacy will cease to exist.  Prescription refills: Only during scheduled appointments. Applies to all prescriptions.  NOTE: The following applies primarily to controlled substances (Opioid* Pain Medications).   Type of encounter (visit): For patients receiving controlled substances, face-to-face visits are required. (Not an option or up to the patient.)  Patient's responsibilities: Pain Pills: Bring all pain pills to every appointment (except for procedure appointments). Pill Bottles: Bring pills in original pharmacy bottle. Always bring the newest bottle. Bring bottle, even if empty. Medication refills: You are responsible for knowing and keeping track of what medications you take and those you need refilled. The day before your appointment: write a list of all prescriptions that need to be refilled. The day of the appointment: give the list to the admitting nurse. Prescriptions will be written only during appointments. No prescriptions will be written on procedure days. If you forget a medication: it will not be "Called in", "Faxed", or "electronically sent". You will  need to get another appointment to get these prescribed. No early refills. Do not call asking to have your prescription filled early. Prescription Accuracy: You are responsible for carefully inspecting your prescriptions before leaving our office. Have the discharge nurse carefully go over each prescription with you, before taking them home. Make sure that your name is accurately spelled, that your address is correct. Check the name and dose of your medication to make sure it is accurate. Check the number of pills, and the written instructions to make sure they are clear and accurate. Make sure that you are given enough medication to last until your next medication refill appointment. Taking Medication: Take medication as prescribed. When it comes to controlled substances, taking less pills or less frequently than prescribed is permitted and encouraged. Never take more pills than instructed. Never take medication more frequently than prescribed.  Inform other Doctors: Always inform, all of your healthcare providers, of all the medications you take. Pain Medication from other Providers: You are not allowed to accept any additional pain medication from any other Doctor or Healthcare provider. There are two exceptions to this rule. (see below) In the event that you require additional pain medication, you are responsible for notifying us, as stated below. Cough Medicine: Often these contain an opioid, such as codeine or hydrocodone. Never accept or take cough medicine containing these opioids if you are already taking an opioid* medication. The combination may cause respiratory failure and death. Medication Agreement: You are responsible for carefully reading and following our Medication Agreement. This must be signed before receiving any prescriptions from our practice. Safely store a copy of your signed Agreement. Violations to the Agreement will result in no further prescriptions. (Additional copies of our  Medication Agreement are available upon request.) Laws, Rules, & Regulations: All patients are expected to follow all Federal and State Laws, Statutes, Rules, & Regulations. Ignorance of   the Laws does not constitute a valid excuse.  Illegal drugs and Controlled Substances: The use of illegal substances (including, but not limited to marijuana and its derivatives) and/or the illegal use of any controlled substances is strictly prohibited. Violation of this rule may result in the immediate and permanent discontinuation of any and all prescriptions being written by our practice. The use of any illegal substances is prohibited. Adopted CDC guidelines & recommendations: Target dosing levels will be at or below 60 MME/day. Use of benzodiazepines** is not recommended.  Exceptions: There are only two exceptions to the rule of not receiving pain medications from other Healthcare Providers. Exception #1 (Emergencies): In the event of an emergency (i.e.: accident requiring emergency care), you are allowed to receive additional pain medication. However, you are responsible for: As soon as you are able, call our office [phone], at any time of the day or night, and leave a message stating your name, the date and nature of the emergency, and the name and dose of the medication prescribed. In the event that your call is answered by a member of our staff, make sure to document and save the date, time, and the name of the person that took your information.  Exception #2 (Planned Surgery): In the event that you are scheduled by another doctor or dentist to have any type of surgery or procedure, you are allowed (for a period no longer than 30 days), to receive additional pain medication, for the acute post-op pain. However, in this case, you are responsible for picking up a copy of our "Post-op Pain Management for Surgeons" handout, and giving it to your surgeon or dentist. This document is available at our office, and  does not require an appointment to obtain it. Simply go to our office during business hours (Monday-Thursday from 8:00 AM to 4:00 PM) (Friday 8:00 AM to 12:00 Noon) or if you have a scheduled appointment with us, prior to your surgery, and ask for it by name. In addition, you are responsible for: calling our office [phone], at any time of the day or night, and leaving a message stating your name, name of your surgeon, type of surgery, and date of procedure or surgery. Failure to comply with your responsibilities may result in termination of therapy involving the controlled substances.  *Opioid medications include: morphine, codeine, oxycodone, oxymorphone, hydrocodone, hydromorphone, meperidine, tramadol, tapentadol, buprenorphine, fentanyl, methadone. **Benzodiazepine medications include: diazepam (Valium), alprazolam (Xanax), clonazepam (Klonopine), lorazepam (Ativan), clorazepate (Tranxene), chlordiazepoxide (Librium), estazolam (Prosom), oxazepam (Serax), temazepam (Restoril), triazolam (Halcion) (Last updated: 05/09/2020) ____________________________________________________________________________________________  ____________________________________________________________________________________________  Medication Recommendations and Reminders  Applies to: All patients receiving prescriptions (written and/or electronic).  Medication Rules & Regulations: These rules and regulations exist for your safety and that of others. They are not flexible and neither are we. Dismissing or ignoring them will be considered "non-compliance" with medication therapy, resulting in complete and irreversible termination of such therapy. (See document titled "Medication Rules" for more details.) In all conscience, because of safety reasons, we cannot continue providing a therapy where the patient does not follow instructions.  Pharmacy of record:  Definition: This is the pharmacy where your electronic  prescriptions will be sent.  We do not endorse any particular pharmacy, however, we have experienced problems with [facility] not securing enough medication supply for the community. We do not restrict you in your choice of pharmacy. However, once we write for your prescriptions, we will NOT be re-sending more prescriptions to fix restricted supply problems   created by your pharmacy, or your insurance.  The pharmacy listed in the electronic medical record should be the one where you want electronic prescriptions to be sent. If you choose to change pharmacy, simply notify our nursing staff.  Recommendations: Keep all of your pain medications in a safe place, under lock and key, even if you live alone. We will NOT replace lost, stolen, or damaged medication. After you fill your prescription, take 1 week's worth of pills and put them away in a safe place. You should keep a separate, properly labeled bottle for this purpose. The remainder should be kept in the original bottle. Use this as your primary supply, until it runs out. Once it's gone, then you know that you have 1 week's worth of medicine, and it is time to come in for a prescription refill. If you do this correctly, it is unlikely that you will ever run out of medicine. To make sure that the above recommendation works, it is very important that you make sure your medication refill appointments are scheduled at least 1 week before you run out of medicine. To do this in an effective manner, make sure that you do not leave the office without scheduling your next medication management appointment. Always ask the nursing staff to show you in your prescription , when your medication will be running out. Then arrange for the receptionist to get you a return appointment, at least 7 days before you run out of medicine. Do not wait until you have 1 or 2 pills left, to come in. This is very poor planning and does not take into consideration that we may need to  cancel appointments due to bad weather, sickness, or emergencies affecting our staff. DO NOT ACCEPT A "Partial Fill": If for any reason your pharmacy does not have enough pills/tablets to completely fill or refill your prescription, do not allow for a "partial fill". The law allows the pharmacy to complete that prescription within 72 hours, without requiring a new prescription. If they do not fill the rest of your prescription within those 72 hours, you will need a separate prescription to fill the remaining amount, which we will NOT provide. If the reason for the partial fill is your insurance, you will need to talk to the pharmacist about payment alternatives for the remaining tablets, but again, DO NOT ACCEPT A PARTIAL FILL, unless you can trust your pharmacist to obtain the remainder of the pills within 72 hours.  Prescription refills and/or changes in medication(s):  Prescription refills, and/or changes in dose or medication, will be conducted only during scheduled medication management appointments. (Applies to both, written and electronic prescriptions.) No refills on procedure days. No medication will be changed or started on procedure days. No changes, adjustments, and/or refills will be conducted on a procedure day. Doing so will interfere with the diagnostic portion of the procedure. No phone refills. No medications will be "called into the pharmacy". No Fax refills. No weekend refills. No Holliday refills. No after hours refills.  Remember:  Business hours are:  Monday to Thursday 8:00 AM to 4:00 PM Provider's Schedule: Domenico Achord, MD - Appointments are:  Medication management: Monday and Wednesday 8:00 AM to 4:00 PM Procedure day: Tuesday and Thursday 7:30 AM to 4:00 PM Bilal Lateef, MD - Appointments are:  Medication management: Tuesday and Thursday 8:00 AM to 4:00 PM Procedure day: Monday and Wednesday 7:30 AM to 4:00 PM (Last update:  12/30/2019) ____________________________________________________________________________________________  ____________________________________________________________________________________________  CBD (cannabidiol) WARNING    Applicable to: All individuals currently taking or considering taking CBD (cannabidiol) and, more important, all patients taking opioid analgesic controlled substances (pain medication). (Example: oxycodone; oxymorphone; hydrocodone; hydromorphone; morphine; methadone; tramadol; tapentadol; fentanyl; buprenorphine; butorphanol; dextromethorphan; meperidine; codeine; etc.)  Legal status: CBD remains a Schedule I drug prohibited for any use. CBD is illegal with one exception. In the [place], CBD has a limited Food and Drug Administration (FDA) approval for the treatment of two specific types of epilepsy disorders. Only one CBD product has been approved by the FDA for this purpose: "Epidiolex". FDA is aware that some companies are marketing products containing cannabis and cannabis-derived compounds in ways that violate the Federal Food, Drug and Cosmetic Act (FD&C Act) and that may put the health and safety of consumers at risk. The FDA, a Federal agency, has not enforced the CBD status since 2018.   Legality: Some manufacturers ship CBD products nationally, which is illegal. Often such products are sold online and are therefore available throughout the country. CBD is openly sold in head shops and health food stores in some states where such sales have not been explicitly legalized. Selling unapproved products with unsubstantiated therapeutic claims is not only a violation of the law, but also can put patients at risk, as these products have not been proven to be safe or effective. Federal illegality makes it difficult to conduct research on CBD.  Reference: "FDA Regulation of Cannabis and Cannabis-Derived Products, Including Cannabidiol (CBD)" -  https://www.fda.gov/news-events/public-health-focus/fda-regulation-cannabis-and-cannabis-derived-products-including-cannabidiol-cbd  Warning: CBD is not FDA approved and has not undergo the same manufacturing controls as prescription drugs.  This means that the purity and safety of available CBD may be questionable. Most of the time, despite manufacturer's claims, it is contaminated with THC (delta-9-tetrahydrocannabinol - the chemical in marijuana responsible for the "HIGH").  When this is the case, the THC contaminant will trigger a positive urine drug screen (UDS) test for Marijuana (carboxy-THC). Because a positive UDS for any illicit substance is a violation of our medication agreement, your opioid analgesics (pain medicine) may be permanently discontinued.  MORE ABOUT CBD  General Information: CBD  is a derivative of the Marijuana (cannabis sativa) plant discovered in 1940. It is one of the 113 identified substances found in Marijuana. It accounts for up to 40% of the plant's extract. As of 2018, preliminary clinical studies on CBD included research for the treatment of anxiety, movement disorders, and pain. CBD is available and consumed in multiple forms, including inhalation of smoke or vapor, as an aerosol spray, and by mouth. It may be supplied as an oil containing CBD, capsules, dried cannabis, or as a liquid solution. CBD is thought not to be as psychoactive as THC (delta-9-tetrahydrocannabinol - the chemical in marijuana responsible for the "HIGH"). Studies suggest that CBD may interact with different biological target receptors in the body, including cannabinoid and other neurotransmitter receptors. As of 2018 the mechanism of action for its biological effects has not been determined.  Side-effects  Adverse reactions: Dry mouth, diarrhea, decreased appetite, fatigue, drowsiness, malaise, weakness, sleep disturbances, and others.  Drug interactions: CBC may interact with other medications  such as blood-thinners. (Last update: 01/16/2020) ____________________________________________________________________________________________  ____________________________________________________________________________________________  Drug Holidays (Slow)  What is a "Drug Holiday"? Drug Holiday: is the name given to the period of time during which a patient stops taking a medication(s) for the purpose of eliminating tolerance to the drug.  Benefits Improved effectiveness of opioids. Decreased opioid dose needed to achieve benefits. Improved pain with lesser dose.    What is tolerance? Tolerance: is the progressive decreased in effectiveness of a drug due to its repetitive use. With repetitive use, the body gets use to the medication and as a consequence, it loses its effectiveness. This is a common problem seen with opioid pain medications. As a result, a larger dose of the drug is needed to achieve the same effect that used to be obtained with a smaller dose.  How long should a "Drug Holiday" last? You should stay off of the pain medicine for at least 14 consecutive days. (2 weeks)  Should I stop the medicine "cold turkey"? No. You should always coordinate with your Pain Specialist so that he/she can provide you with the correct medication dose to make the transition as smoothly as possible.  How do I stop the medicine? Slowly. You will be instructed to decrease the daily amount of pills that you take by one (1) pill every seven (7) days. This is called a "slow downward taper" of your dose. For example: if you normally take four (4) pills per day, you will be asked to drop this dose to three (3) pills per day for seven (7) days, then to two (2) pills per day for seven (7) days, then to one (1) per day for seven (7) days, and at the end of those last seven (7) days, this is when the "Drug Holiday" would start.   Will I have withdrawals? By doing a "slow downward taper" like this one, it  is unlikely that you will experience any significant withdrawal symptoms. Typically, what triggers withdrawals is the sudden stop of a high dose opioid therapy. Withdrawals can usually be avoided by slowly decreasing the dose over a prolonged period of time. If you do not follow these instructions and decide to stop your medication abruptly, withdrawals may be possible.  What are withdrawals? Withdrawals: refers to the wide range of symptoms that occur after stopping or dramatically reducing opiate drugs after heavy and prolonged use. Withdrawal symptoms do not occur to patients that use low dose opioids, or those who take the medication sporadically. Contrary to benzodiazepine (example: Valium, Xanax, etc.) or alcohol withdrawals ("Delirium Tremens"), opioid withdrawals are not lethal. Withdrawals are the physical manifestation of the body getting rid of the excess receptors.  Expected Symptoms Early symptoms of withdrawal may include: Agitation Anxiety Muscle aches Increased tearing Insomnia Runny nose Sweating Yawning  Late symptoms of withdrawal may include: Abdominal cramping Diarrhea Dilated pupils Goose bumps Nausea Vomiting  Will I experience withdrawals? Due to the slow nature of the taper, it is very unlikely that you will experience any.  What is a slow taper? Taper: refers to the gradual decrease in dose.  (Last update: 12/30/2019) ____________________________________________________________________________________________    

## 2020-11-23 ENCOUNTER — Ambulatory Visit: Payer: Medicare HMO | Attending: Pain Medicine | Admitting: Pain Medicine

## 2020-11-23 ENCOUNTER — Other Ambulatory Visit: Payer: Self-pay

## 2020-11-23 ENCOUNTER — Encounter: Payer: Self-pay | Admitting: Pain Medicine

## 2020-11-23 VITALS — BP 113/89 | HR 71 | Temp 97.0°F | Resp 15 | Ht 67.0 in | Wt 187.0 lb

## 2020-11-23 DIAGNOSIS — M79641 Pain in right hand: Secondary | ICD-10-CM | POA: Diagnosis present

## 2020-11-23 DIAGNOSIS — Z981 Arthrodesis status: Secondary | ICD-10-CM | POA: Diagnosis present

## 2020-11-23 DIAGNOSIS — G5793 Unspecified mononeuropathy of bilateral lower limbs: Secondary | ICD-10-CM | POA: Diagnosis not present

## 2020-11-23 DIAGNOSIS — G894 Chronic pain syndrome: Secondary | ICD-10-CM | POA: Insufficient documentation

## 2020-11-23 DIAGNOSIS — Z79891 Long term (current) use of opiate analgesic: Secondary | ICD-10-CM | POA: Diagnosis present

## 2020-11-23 DIAGNOSIS — M25511 Pain in right shoulder: Secondary | ICD-10-CM | POA: Insufficient documentation

## 2020-11-23 DIAGNOSIS — M545 Low back pain, unspecified: Secondary | ICD-10-CM | POA: Insufficient documentation

## 2020-11-23 DIAGNOSIS — G8929 Other chronic pain: Secondary | ICD-10-CM

## 2020-11-23 DIAGNOSIS — T8149XS Infection following a procedure, other surgical site, sequela: Secondary | ICD-10-CM

## 2020-11-23 DIAGNOSIS — M79642 Pain in left hand: Secondary | ICD-10-CM | POA: Diagnosis present

## 2020-11-23 DIAGNOSIS — M25512 Pain in left shoulder: Secondary | ICD-10-CM | POA: Insufficient documentation

## 2020-11-23 DIAGNOSIS — Z79899 Other long term (current) drug therapy: Secondary | ICD-10-CM | POA: Insufficient documentation

## 2020-11-23 MED ORDER — KETOROLAC TROMETHAMINE 60 MG/2ML IM SOLN
60.0000 mg | Freq: Once | INTRAMUSCULAR | Status: AC
Start: 2020-11-23 — End: 2020-11-23
  Administered 2020-11-23: 60 mg via INTRAMUSCULAR
  Filled 2020-11-23: qty 2

## 2020-11-23 MED ORDER — OXYCODONE-ACETAMINOPHEN 10-325 MG PO TABS: 1.0000 | ORAL_TABLET | Freq: Four times a day (QID) | ORAL | 0 refills | Status: DC | PRN

## 2020-11-23 MED ORDER — OXYCODONE-ACETAMINOPHEN 10-325 MG PO TABS: 1.0000 | ORAL_TABLET | Freq: Three times a day (TID) | ORAL | 0 refills | Status: DC | PRN

## 2020-11-23 MED ORDER — ORPHENADRINE CITRATE 30 MG/ML IJ SOLN
60.0000 mg | Freq: Once | INTRAMUSCULAR | Status: AC
Start: 2020-11-23 — End: 2020-11-23
  Administered 2020-11-23: 60 mg via INTRAMUSCULAR
  Filled 2020-11-23: qty 2

## 2020-11-23 NOTE — Progress Notes (Signed)
   Nursing Pain Medication Assessment:  Safety precautions to be maintained throughout the outpatient stay will include: orient to surroundings, keep bed in low position, maintain call bell within reach at all times, provide assistance with transfer out of bed and ambulation.  Medication Inspection Compliance: Pill count conducted under aseptic conditions, in front of the patient. Neither the pills nor the bottle was removed from the patient's sight at any time. Once count was completed pills were immediately returned to the patient in their original bottle.  Medication: Oxycodone/APAP Pill/Patch Count:  12 of 90 pills remain Pill/Patch Appearance: Markings consistent with prescribed medication Bottle Appearance: Standard pharmacy container. Clearly labeled. Filled Date: 5 / 20 / 2022 Last Medication intake:  Today

## 2020-11-24 ENCOUNTER — Telehealth: Payer: Self-pay | Admitting: *Deleted

## 2020-11-24 NOTE — Telephone Encounter (Signed)
[facility] called in to check on Rx that was sent in on yesterday to last 11 days.  It also had must last 30 days.  Pharmacist will remove that portion.  Patient has a next fill on 12/04/20.  She also asked about the increase in the medication and I told her that it was d/t a surgery that was then complicated by a wound infection.  The dosing should go back down shortly once healing has taken place.

## 2020-11-30 LAB — TOXASSURE SELECT 13 (MW), URINE

## 2021-02-01 ENCOUNTER — Other Ambulatory Visit: Payer: Self-pay | Admitting: Pain Medicine

## 2021-02-01 ENCOUNTER — Telehealth: Payer: Self-pay | Admitting: *Deleted

## 2021-02-01 ENCOUNTER — Telehealth: Payer: Self-pay

## 2021-02-01 DIAGNOSIS — M25511 Pain in right shoulder: Secondary | ICD-10-CM

## 2021-02-01 DIAGNOSIS — G8929 Other chronic pain: Secondary | ICD-10-CM

## 2021-02-01 DIAGNOSIS — G5793 Unspecified mononeuropathy of bilateral lower limbs: Secondary | ICD-10-CM

## 2021-02-01 DIAGNOSIS — Z79891 Long term (current) use of opiate analgesic: Secondary | ICD-10-CM

## 2021-02-01 DIAGNOSIS — Z79899 Other long term (current) drug therapy: Secondary | ICD-10-CM

## 2021-02-01 DIAGNOSIS — M545 Low back pain, unspecified: Secondary | ICD-10-CM

## 2021-02-01 DIAGNOSIS — G894 Chronic pain syndrome: Secondary | ICD-10-CM

## 2021-02-01 DIAGNOSIS — M79641 Pain in right hand: Secondary | ICD-10-CM

## 2021-02-01 MED ORDER — OXYCODONE-ACETAMINOPHEN 10-325 MG PO TABS: 1.0000 | ORAL_TABLET | Freq: Three times a day (TID) | ORAL | 0 refills | Status: DC | PRN

## 2021-02-01 NOTE — Telephone Encounter (Signed)
Called and notified patient that med is due to be picked up on 02/02/2021. Patient with understanding.

## 2021-02-01 NOTE — Telephone Encounter (Signed)
Called to [facility].  The Rx in question has been deactivated because there were 4 sent with the same written date. I have sent an inbox to [facility] as well as left him a note to please resend Rx to fill on tomorrow.

## 2021-02-01 NOTE — Telephone Encounter (Signed)
Pt wants to know if Rx is ready for pick up

## 2021-02-01 NOTE — Telephone Encounter (Signed)
Called patient to let him know that [facility] has resent the Rx to fill on 02/01/21.  Also reminded of appt for 03/01/21 @ 0840 MM.

## 2021-02-01 NOTE — Telephone Encounter (Signed)
Called patient back to let him know what has happened with Rx and that I have a message into FN to see if he will resend the message.

## 2021-02-01 NOTE — Telephone Encounter (Signed)
Speaking with [facility].  They are saying that the Rx for oxycodone - apap 10-'325mg'$  to pickup on 02/02/21 has been deactivated and the reason is that there werer 4 Rx's with the same written on date and there may only be 3 with the same written date.  Patient d/t pickup tomorrow.  Will notify [facility] about this.

## 2021-02-01 NOTE — Telephone Encounter (Signed)
Patient called in to check on his medications.  States he called [facility] and they said they did not have an Rx for him.  I can see where there is an RX to pick up tomorrow.  Will call [facility] once they are open today to confirm.

## 2021-02-01 NOTE — Progress Notes (Signed)
The patient's pharmacy cancel the fourth prescription.

## 2021-02-25 NOTE — Progress Notes (Signed)
PROVIDER NOTE: Information contained herein reflects review and annotations entered in association with encounter. Interpretation of such information and data should be left to medically-trained personnel. Information provided to patient can be located elsewhere in the medical record under "Patient Instructions". Document created using STT-dictation technology, any transcriptional errors that may result from process are unintentional.    Patient: Howard Murphy  Service Category: E/M  Provider: Gaspar Cola, MD  DOB: [birth year]  DOS: 03/01/2021  Specialty: Interventional Pain Management  MRN: [id number]  Setting: Ambulatory outpatient  PCP: [facility] [place]  Type: Established Patient    Referring Provider: [facility]  Location: Office  Delivery: Face-to-face     HPI  Howard Murphy, a 65 y.o. year old male, is here today because of his Chronic bilateral low back pain without sciatica [M54.50, G89.29]. Howard Murphy primary complain today is Back Pain Last encounter: My last encounter with him was on 11/23/2020. Pertinent problems: Howard Murphy has History of lumbar facet Synovial cyst, surgically removed.; Chronic abdominal pain (RUQ); Type 2 diabetes mellitus ([place]); Spondylosis of lumbar spine; Lumbar facet joint syndrome ([place]); Chronic hip pain (Left); Diabetic peripheral neuropathy ([place]); Chronic lumbar radicular pain (S1 Dermatome) (Bilateral) (L>R); Complete tear of the shoulder rotator cuff (Left); Chronic shoulder pain (2ry area of Pain) (Bilateral) (L>R); Chronic foot pain (3ry area of Pain) (Bilateral) (L>R); Chronic hand pain (4th area of Pain) (Bilateral) (R>L); Lumbar facet hypertrophy; Occipital pain (Right); Failed back surgical syndrome 4; Chronic low back pain (1ry area of Pain) (Bilateral) (L>R) w/o sciatica; Radicular pain of shoulder (Right); Carpal tunnel syndrome (Bilateral); Chronic cervical radicular pain (Right); Abnormal MRI, lumbar spine  (12/23/2019); Chronic sacroiliac joint pain (Bilateral) (L>R); Neurogenic pain; Cervical spondylosis; Cervical facet arthropathy; Chronic neck pain (Bilateral) (R>L); Cervical facet syndrome (Bilateral) (R>L); Chronic upper extremity pain (Bilateral) (R>L); Cervical foraminal stenosis;  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, C5-6); Arthropathy of shoulder (Right); 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); Lumbar foraminal stenosis  (Left: L4-5) (Right: L1-2); Grade 1 Anterolisthesis (2 mm) of L4 over L5.; Lumbar facet (L4-5) synovial cyst (6 mm) (Left); Chronic pain syndrome; Spondylolisthesis at L3-L4 level; Complete tear of left rotator cuff; Bertolotti's syndrome (L5-S1) (Bilateral); 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); Other specified dorsopathies, sacral and sacrococcygeal region; Spondylosis without myelopathy or radiculopathy, lumbosacral region; Chronic bilateral low back pain with bilateral sciatica; Neurogenic claudication ([place]); Osteoarthritis involving multiple joints; Chronic musculoskeletal pain; Acute exacerbation of chronic low back pain; Grade 1 Retrolisthesis of L1/L2; Lumbar disc extrusion with 7 mm caudal migration (L1-2); Lumbosacral radiculopathy at L1 (Right); Groin pain (Right); 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 Pain Assessment: Severity of Chronic pain is reported as a 3 /10. Location: Back  /down back of legs bilateral to above knees. Onset:  . Quality: Tingling, Discomfort, Aching, Constant (Hurts to touch it). Timing:  . Modifying factor(s): back brace, medications,rest. Vitals:  height is '5\' 7"'  (1.702 m) and weight is 170 lb (77.1 kg). His temperature is 96.6 F (35.9 C) (abnormal). His blood pressure is 111/85  and his pulse is 73. His respiration is 18 and oxygen saturation is 100%.   Reason for encounter: medication management.   The patient indicates doing well with the current medication regimen. No adverse reactions or side effects reported to the  medications.   RTCB: 06/02/2021 Nonopioids transferred 03/16/2021: Melatonin  Pharmacotherapy Assessment  Analgesic: Oxycodone IR 10 mg 1 tab PO q 8 hrs (30 mg/day of oxycodone) MME/day: 45 mg/day.   Monitoring: [place] PMP: PDMP reviewed during this encounter.       Pharmacotherapy: No side-effects or adverse reactions reported. Compliance: No problems identified. Effectiveness: Clinically acceptable.  Ignatius Specking, RN  03/01/2021  8:55 AM  Sign when Signing Visit Nursing Pain Medication Assessment:  Safety precautions to be maintained throughout the outpatient stay will include: orient to surroundings, keep bed in low position, maintain call bell within reach at all times, provide assistance with transfer out of bed and ambulation.  Medication Inspection Compliance: Pill count conducted under aseptic conditions, in front of the patient. Neither the pills nor the bottle was removed from the patient's sight at any time. Once count was completed pills were immediately returned to the patient in their original bottle.  Medication: Oxycodone/APAP Pill/Patch Count:  0 of 90 pills remain Pill/Patch Appearance: Markings consistent with prescribed medication Bottle Appearance: Standard pharmacy container. Clearly labeled. Filled Date: 8 / 25 / 2022 Last Medication intake:  Today     UDS:  Summary  Date Value Ref Range Status  11/23/2020 Note  Final    Comment:    ==================================================================== [employer] [facility] 13 (MW) ==================================================================== Test                             Result       Flag       Units  Drug Present and Declared for Prescription Verification    Oxycodone                      2848         EXPECTED   ng/mg creat   Oxymorphone                    7410         EXPECTED   ng/mg creat   Noroxycodone                   4432         EXPECTED   ng/mg creat   Noroxymorphone                 1613         EXPECTED   ng/mg creat    Sources of oxycodone are scheduled prescription medications.    Oxymorphone, noroxycodone, and noroxymorphone are expected    metabolites of oxycodone. Oxymorphone is also available as a    scheduled prescription medication.  ==================================================================== Test                      Result    Flag   Units      Ref Range   Creatinine              104              mg/dL      >=20 ==================================================================== Declared Medications:  The flagging and interpretation on this report are based on the  following declared medications.  Unexpected results may arise from  inaccuracies in the declared medications.   **Note: The testing scope of this panel includes these medications:   Oxycodone (Percocet)   **Note: The testing scope of this panel does not include the  following  reported medications:   Acetaminophen (Tylenol)  Acetaminophen (Percocet)  Atorvastatin (Lipitor)  Cyclobenzaprine (Flexeril)  Empagliflozin (Jardiance)  Meclizine (Antivert)  Omeprazole (Prilosec)  Sennosides (Senokot)  Sitagliptin (Januvia)  Tamsulosin (Flomax)  Testosterone  Zolpidem (Ambien) ==================================================================== For clinical consultation, please call [phone]. ====================================================================      ROS  Constitutional: Denies any fever or chills Gastrointestinal: No reported hemesis, hematochezia, vomiting, or acute GI distress Musculoskeletal: Denies any acute onset joint swelling, redness, loss of ROM, or weakness Neurological: No reported episodes of acute onset  apraxia, aphasia, dysarthria, agnosia, amnesia, paralysis, loss of coordination, or loss of consciousness  Medication Review  UNABLE TO FIND, acetaminophen, atorvastatin, cyclobenzaprine, empagliflozin, meclizine, omeprazole, oxyCODONE-acetaminophen, senna-docusate, sitaGLIPtin, tamsulosin, testosterone cypionate, and zolpidem  History Review  Allergy: Howard Murphy is allergic to adhesive [tape]. Drug: Howard Murphy  reports no history of drug use. Alcohol:  reports no history of alcohol use. Tobacco:  reports that he has never smoked. He quit smokeless tobacco use about 8 years ago.  His smokeless tobacco use included chew. Social: Howard Murphy  reports that he has never smoked. He quit smokeless tobacco use about 8 years ago.  His smokeless tobacco use included chew. He reports that he does not drink alcohol and does not use drugs. Medical:  has a past medical history of Arthritis, Back ache (05/05/2013), Diabetes mellitus without complication ([place]), GERD (gastroesophageal reflux disease), Headache, History of kidney stones, Hyperlipemia, Hypertension, Kidney stones, Kidney stones, Nephrolithiasis, and Shortness of breath dyspnea. Surgical: Howard Murphy  has a past surgical history that includes Cholecystectomy (2000); Shoulder surgery (Right, 2016); Colonoscopy with propofol (N/A, 04/11/2015); Esophagogastroduodenoscopy (egd) with propofol (N/A, 04/11/2015); Shoulder surgery (Left, 06/2015); Back surgery (2015); Back surgery (08/09/2016); Cystoscopy with ureteroscopy and stent placement (Right, 08/27/2017); laparoscopic appendectomy (N/A, 12/18/2017); Appendectomy; Back surgery (Bilateral, 2019); Posterior lumbar fusion 4 level (N/A, 09/14/2020); and Incision and drainage (N/A, 10/22/2020). Family: family history includes Cancer in his maternal grandmother, mother, sister, and sister; Heart disease in his father; Stroke in his father.  Laboratory Chemistry Profile   Renal Lab Results  Component Value  Date   BUN 16 10/27/2020   CREATININE 0.82 10/27/2020   LABCREA 182 02/24/2016   BCR 14 03/07/2017   GFRAA >60 12/22/2019   GFRNONAA >60 10/27/2020    Hepatic Lab Results  Component Value Date   AST 29 12/22/2019   ALT 36 12/22/2019   ALBUMIN 4.3 12/22/2019   ALKPHOS 68 12/22/2019   LIPASE 46 12/03/2018    Electrolytes Lab Results  Component Value Date   NA 138 10/27/2020   K 4.1 10/27/2020   CL 106 10/27/2020   CALCIUM 8.7 (L) 10/27/2020   MG 2.0 10/27/2020    Bone Lab Results  Component Value Date   25OHVITD1 24 (L) 11/29/2016   25OHVITD2 <1.0 11/29/2016   25OHVITD3 24 11/29/2016    Inflammation (CRP: Acute Phase) (ESR: Chronic Phase) Lab Results  Component Value Date   CRP 2.4 (H) 10/22/2020   ESRSEDRATE 10 10/22/2020   LATICACIDVEN 1.0 09/25/2020         Note: Above Lab results reviewed.  Recent Imaging Review  MR THORACIC SPINE W WO CONTRAST CLINICAL DATA:  Initial evaluation for acute back pain, concern for midline wound infection, redness and drainage.  EXAM: MRI THORACIC AND LUMBAR SPINE WITHOUT AND WITH CONTRAST  TECHNIQUE: Multiplanar and multiecho pulse sequences of the thoracic and lumbar spine were obtained without and with intravenous contrast.  CONTRAST:  7.83m GADAVIST GADOBUTROL 1 MMOL/ML IV  SOLN  COMPARISON:  Prior CT from earlier the same day as well as previous studies.  FINDINGS: MRI THORACIC SPINE FINDINGS  Alignment: Trace retrolisthesis of T11 on T12. Underlying trace dextroscoliosis.  Vertebrae: Mild chronic wedging of T11 again noted, stable. Vertebral body height otherwise maintained without acute or subacute fracture. Susceptibility artifact related to prior posterior fusion extending from T11 through the sacrum. Hardware better evaluated on prior CT. Underlying bone marrow signal intensity within normal limits. No worrisome osseous lesions. No findings to suggest osteomyelitis discitis or septic arthritis.  Cord:  Evaluation of the distal cord limited by adjacent susceptibility effect. Visualized portions are normal without signal abnormality. No epidural abscess or other collection.  Paraspinal and other soft tissues: Postoperative changes from recent fusion present within the lower posterior paraspinous soft tissues. Small collection measuring 5 x 8 x 21 mm seen along the midline incision at the level of T11 (series 22, image 34). No significant subfascial extension. Finding favored to reflect a small postoperative seroma. Superimposed infection not excluded, and could be considered in the correct clinical setting. No other loculated collections. Visualized lungs are grossly clear. Visualized visceral structures unremarkable.  Disc levels:  C7-T1: Small central disc protrusion indents the ventral thecal sac without significant spinal stenosis (series 22, image 2).  No other significant disc pathology seen within the thoracic spine. No spinal stenosis. She left-sided facet arthrosis at T5-6 through T8-9 with resultant moderate left foraminal stenosis at T5-6, with more mild narrowing at the remaining levels again noted. Mild right foraminal narrowing at T6-7 due to facet arthropathy.  MRI LUMBAR SPINE FINDINGS  Segmentation: Transitional lumbosacral anatomy with a lumbarized S1 segment. Vertebral body count is made from the dens, with same numbering system utilized as on previous exams.  Alignment: 3-4 mm retrolisthesis of L2 on L3, stable. Alignment otherwise normal with preservation of the normal lumbar lordosis.  Vertebrae: Extensive susceptibility artifact related to prior fusion at T11 through the sacrum. Interbody devices in place at L3-4, L4-5, and L5-S1. Hardware better evaluated on prior CT. Vertebral body height maintained without acute fracture. Underlying bone marrow signal intensity within normal limits. No worrisome osseous lesions. Heterogeneous signal abnormality about  the L2-3 interspace felt to be most consistent with reactive discogenic change. No other signal abnormality. No findings to suggest osteomyelitis discitis or septic arthritis.  Conus medullaris: Extends to the L1-2 level and appears normal. No epidural collections.  Paraspinal and other soft tissues: Extensive postoperative changes present throughout the lower posterior paraspinous musculature and soft tissues. Small collection at the right laminectomy site at L2-3 measures 2.5 x 2.4 cm (series 28, image 5). No significant regional mass effect or surrounding edema. Finding favored to reflect a benign postoperative seroma. No other discrete or loculated collections. Moderate distension of the partially visualized urinary bladder noted. Retroaortic left renal vein noted.  Disc levels:  L1-2: Seen only on sagittal projection. Minimal annular disc bulge. Prior posterior fusion. No stenosis.  L2-3: Retrolisthesis. Advanced degenerative intervertebral disc space narrowing with disc desiccation, diffuse disc bulge, and reactive endplate change, worse on the right. Prior posterior fusion with right laminectomy. No significant residual spinal stenosis. Foramina appear patent.  L3-4: Prior posterior and interbody fusion. Bilateral facet hypertrophy. No significant residual spinal stenosis. Foramina appear patent.  L4-5: Prior posterior and interbody fusion with posterior decompression. No residual spinal stenosis. Foramina appear patent.  L5-S1: Prior posterior and interbody fusion with posterior decompression. No residual canal or foraminal stenosis.  S1-2: Negative interspace. Prior  posterior fusion. No residual spinal stenosis. Foramina appear patent.  IMPRESSION: 1. Postoperative changes from prior fusion at T11 through the sacrum. Extensive postoperative changes throughout the posterior paraspinous soft tissues with superimposed small 5 x 8 x 21 mm collection along the midline  incision at the level of T11. Finding favored to reflect a small postoperative seroma, although superimposed infection not excluded, and could be considered in the correct clinical setting. 2. Additional 2.5 x 2.4 cm collection along the right laminectomy site at L2-3, also favored to reflect a postoperative seroma. No significant mass effect. 3. No other evidence for acute infection within the thoracic or lumbar spine. 4. Extensive postoperative changes from prior fusion at T11 through the sacrum. No residual or recurrent spinal stenosis.  Electronically Signed   By: Jeannine Boga M.D.   On: 10/22/2020 03:16 MR Lumbar Spine W Wo Contrast CLINICAL DATA:  Initial evaluation for acute back pain, concern for midline wound infection, redness and drainage.  EXAM: MRI THORACIC AND LUMBAR SPINE WITHOUT AND WITH CONTRAST  TECHNIQUE: Multiplanar and multiecho pulse sequences of the thoracic and lumbar spine were obtained without and with intravenous contrast.  CONTRAST:  7.94m GADAVIST GADOBUTROL 1 MMOL/ML IV SOLN  COMPARISON:  Prior CT from earlier the same day as well as previous studies.  FINDINGS: MRI THORACIC SPINE FINDINGS  Alignment: Trace retrolisthesis of T11 on T12. Underlying trace dextroscoliosis.  Vertebrae: Mild chronic wedging of T11 again noted, stable. Vertebral body height otherwise maintained without acute or subacute fracture. Susceptibility artifact related to prior posterior fusion extending from T11 through the sacrum. Hardware better evaluated on prior CT. Underlying bone marrow signal intensity within normal limits. No worrisome osseous lesions. No findings to suggest osteomyelitis discitis or septic arthritis.  Cord: Evaluation of the distal cord limited by adjacent susceptibility effect. Visualized portions are normal without signal abnormality. No epidural abscess or other collection.  Paraspinal and other soft tissues: Postoperative changes  from recent fusion present within the lower posterior paraspinous soft tissues. Small collection measuring 5 x 8 x 21 mm seen along the midline incision at the level of T11 (series 22, image 34). No significant subfascial extension. Finding favored to reflect a small postoperative seroma. Superimposed infection not excluded, and could be considered in the correct clinical setting. No other loculated collections. Visualized lungs are grossly clear. Visualized visceral structures unremarkable.  Disc levels:  C7-T1: Small central disc protrusion indents the ventral thecal sac without significant spinal stenosis (series 22, image 2).  No other significant disc pathology seen within the thoracic spine. No spinal stenosis. She left-sided facet arthrosis at T5-6 through T8-9 with resultant moderate left foraminal stenosis at T5-6, with more mild narrowing at the remaining levels again noted. Mild right foraminal narrowing at T6-7 due to facet arthropathy.  MRI LUMBAR SPINE FINDINGS  Segmentation: Transitional lumbosacral anatomy with a lumbarized S1 segment. Vertebral body count is made from the dens, with same numbering system utilized as on previous exams.  Alignment: 3-4 mm retrolisthesis of L2 on L3, stable. Alignment otherwise normal with preservation of the normal lumbar lordosis.  Vertebrae: Extensive susceptibility artifact related to prior fusion at T11 through the sacrum. Interbody devices in place at L3-4, L4-5, and L5-S1. Hardware better evaluated on prior CT. Vertebral body height maintained without acute fracture. Underlying bone marrow signal intensity within normal limits. No worrisome osseous lesions. Heterogeneous signal abnormality about the L2-3 interspace felt to be most consistent with reactive discogenic change. No other signal abnormality. No findings  to suggest osteomyelitis discitis or septic arthritis.  Conus medullaris: Extends to the L1-2 level and  appears normal. No epidural collections.  Paraspinal and other soft tissues: Extensive postoperative changes present throughout the lower posterior paraspinous musculature and soft tissues. Small collection at the right laminectomy site at L2-3 measures 2.5 x 2.4 cm (series 28, image 5). No significant regional mass effect or surrounding edema. Finding favored to reflect a benign postoperative seroma. No other discrete or loculated collections. Moderate distension of the partially visualized urinary bladder noted. Retroaortic left renal vein noted.  Disc levels:  L1-2: Seen only on sagittal projection. Minimal annular disc bulge. Prior posterior fusion. No stenosis.  L2-3: Retrolisthesis. Advanced degenerative intervertebral disc space narrowing with disc desiccation, diffuse disc bulge, and reactive endplate change, worse on the right. Prior posterior fusion with right laminectomy. No significant residual spinal stenosis. Foramina appear patent.  L3-4: Prior posterior and interbody fusion. Bilateral facet hypertrophy. No significant residual spinal stenosis. Foramina appear patent.  L4-5: Prior posterior and interbody fusion with posterior decompression. No residual spinal stenosis. Foramina appear patent.  L5-S1: Prior posterior and interbody fusion with posterior decompression. No residual canal or foraminal stenosis.  S1-2: Negative interspace. Prior posterior fusion. No residual spinal stenosis. Foramina appear patent.  IMPRESSION: 1. Postoperative changes from prior fusion at T11 through the sacrum. Extensive postoperative changes throughout the posterior paraspinous soft tissues with superimposed small 5 x 8 x 21 mm collection along the midline incision at the level of T11. Finding favored to reflect a small postoperative seroma, although superimposed infection not excluded, and could be considered in the correct clinical setting. 2. Additional 2.5 x 2.4 cm  collection along the right laminectomy site at L2-3, also favored to reflect a postoperative seroma. No significant mass effect. 3. No other evidence for acute infection within the thoracic or lumbar spine. 4. Extensive postoperative changes from prior fusion at T11 through the sacrum. No residual or recurrent spinal stenosis.  Electronically Signed   By: Jeannine Boga M.D.   On: 10/22/2020 03:16 CT Thoracic Spine Wo Contrast CLINICAL DATA:  Initial evaluation for low back pain, infection or inflammation suspected.  EXAM: CT THORACIC AND LUMBAR SPINE WITHOUT CONTRAST  TECHNIQUE: Multidetector CT imaging of the thoracic and lumbar spine was performed without contrast. Multiplanar CT image reconstructions were also generated.  COMPARISON:  Prior study from 09/26/2020.  FINDINGS: CT THORACIC SPINE FINDINGS  Alignment: Trace retrolisthesis of T11 on T12, stable. Alignment otherwise normal.  Vertebrae: No acute or interval fracture. No discrete or worrisome osseous lesions. Mild chronic anterior wedging of the T11 vertebral body, stable. Posterior fusion extending from T11 through the sacrum. Hardware remains well positioned without evidence for loosening or other complication.  Paraspinal and other soft tissues: Mild postoperative stranding present within the posterior paraspinous soft tissues extending from T8-9 inferiorly. No discrete collections evident by CT. Remainder of the paraspinous soft tissues otherwise unremarkable. Partially visualized lungs are clear.  Disc levels: Anterior bridging osteophytes extending from T7 through L1. No new or progressive spinal stenosis. Left-sided facet arthrosis at T5-6-T8-9 with resultant moderate left foraminal stenosis at T5-6, with more mild narrowing at the remaining levels. Mild right foraminal narrowing at T6-7 due to facet arthrosis. Appearance is stable.  CT LUMBAR SPINE FINDINGS  Segmentation: Transitional  lumbosacral anatomy with a lumbarized S1 segment. Same numbering system employed as on previous exam.  Alignment: Mild retrolisthesis of L2 on L3, stable. Alignment otherwise normal.  Vertebrae: Posterior fusion extending from T11  through the sacrum. Hardware in stable position and is well aligned without loosening or other complication. Sequelae of prior screw removal on the right again noted at L5. Bilateral iliac screws remain in place traversing both SI joints. Interbody implants in place at L3-4, L4-5, and L5-S1, stable in position. Vertebral body height maintained without fracture. No discrete or worrisome osseous lesions.  Paraspinal and other soft tissues: Postoperative changes within the posterior paraspinous soft tissues with expected postoperative stranding. No visible collections evident by CT. Aortic atherosclerosis noted.  Disc levels:  L1-2: Posterior fusion. Residual disc bulge with facet hypertrophy without significant stenosis.  L2-3: Advanced degenerative intervertebral disc space narrowing with disc bulge, disc desiccation, and reactive endplate change, worse on the right. Associated degenerative sclerosis. Prior right laminectomy. Residual bilateral facet arthrosis. No spinal stenosis. Mild bilateral neural foraminal stenosis due to spurring, stable.  L3-4 through S1-2. Previous fusion with similar appearance to previous exam. No significant residual stenosis.  IMPRESSION: 1. Postoperative changes from prior posterior fusion from T11 through the sacrum, stable from previous without complication. Similar postoperative changes within the posterior paraspinous soft tissues without visible collection by CT. 2. 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 3. Aortic Atherosclerosis (ICD10-I70.0).  Electronically Signed   By: Jeannine Boga M.D.   On: 10/22/2020 01:09 CT Lumbar Spine Wo  Contrast CLINICAL DATA:  Initial evaluation for low back pain, infection or inflammation suspected.  EXAM: CT THORACIC AND LUMBAR SPINE WITHOUT CONTRAST  TECHNIQUE: Multidetector CT imaging of the thoracic and lumbar spine was performed without contrast. Multiplanar CT image reconstructions were also generated.  COMPARISON:  Prior study from 09/26/2020.  FINDINGS: CT THORACIC SPINE FINDINGS  Alignment: Trace retrolisthesis of T11 on T12, stable. Alignment otherwise normal.  Vertebrae: No acute or interval fracture. No discrete or worrisome osseous lesions. Mild chronic anterior wedging of the T11 vertebral body, stable. Posterior fusion extending from T11 through the sacrum. Hardware remains well positioned without evidence for loosening or other complication.  Paraspinal and other soft tissues: Mild postoperative stranding present within the posterior paraspinous soft tissues extending from T8-9 inferiorly. No discrete collections evident by CT. Remainder of the paraspinous soft tissues otherwise unremarkable. Partially visualized lungs are clear.  Disc levels: Anterior bridging osteophytes extending from T7 through L1. No new or progressive spinal stenosis. Left-sided facet arthrosis at T5-6-T8-9 with resultant moderate left foraminal stenosis at T5-6, with more mild narrowing at the remaining levels. Mild right foraminal narrowing at T6-7 due to facet arthrosis. Appearance is stable.  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 Same numbering system employed as on previous exam.  Alignment: Mild retrolisthesis of L2 on L3, stable. Alignment otherwise normal.  Vertebrae: Posterior fusion extending from T11 through the sacrum. Hardware in stable position and is well aligned without loosening or other complication. Sequelae of prior screw removal on the right again noted at L5. Bilateral iliac screws remain  in place traversing both SI joints. Interbody implants in place at L3-4, L4-5, and L5-S1, stable in position. Vertebral body height maintained without fracture. No discrete or worrisome osseous lesions.  Paraspinal and other soft tissues: Postoperative changes within the posterior paraspinous soft tissues with expected postoperative stranding. No visible collections evident by CT. Aortic atherosclerosis noted.  Disc levels:  L1-2: Posterior fusion. Residual disc bulge with facet hypertrophy without significant stenosis.  L2-3: Advanced degenerative intervertebral disc space narrowing with disc bulge, disc desiccation, and reactive endplate  change, worse on the right. Associated degenerative sclerosis. Prior right laminectomy. Residual bilateral facet arthrosis. No spinal stenosis. Mild bilateral neural foraminal stenosis due to spurring, stable.  L3-4 through S1-2. Previous fusion with similar appearance to previous exam. No significant residual stenosis.  IMPRESSION: 1. Postoperative changes from prior posterior fusion from T11 through the sacrum, stable from previous without complication. Similar postoperative changes within the posterior paraspinous soft tissues without visible collection by CT. 2. 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 3. Aortic Atherosclerosis (ICD10-I70.0).  Electronically Signed   By: Jeannine Boga M.D.   On: 10/22/2020 01:09 Note: Reviewed        Physical Exam  General appearance: Well nourished, well developed, and well hydrated. In no apparent acute distress Mental status: Alert, oriented x 3 (person, place, & time)       Respiratory: No evidence of acute respiratory distress Eyes: PERLA Vitals: BP 111/85   Pulse 73   Temp (!) 96.6 F (35.9 C)   Resp 18   Ht '5\' 7"'  (1.702 m)   Wt 170 lb (77.1 kg)   SpO2 100%   BMI 26.63 kg/m  BMI: Estimated body mass index is 26.63 kg/m as  calculated from the following:   Height as of this encounter: '5\' 7"'  (1.702 m).   Weight as of this encounter: 170 lb (77.1 kg). Ideal: Ideal body weight: 66.1 kg (145 lb 11.6 oz) Adjusted ideal body weight: 70.5 kg (155 lb 6.9 oz)  Assessment   Status Diagnosis  Controlled Controlled Controlled 1. Chronic low back pain (1ry area of Pain) (Bilateral) (L>R) w/o sciatica   2. Chronic shoulder pain (2ry area of Pain) (Bilateral) (L>R)   3. Chronic foot pain (3ry area of Pain) (Bilateral) (L>R)   4. Chronic hand pain (4th area of Pain) (Bilateral) (R>L)   5. Failed back surgical syndrome 4   6. Chronic pain syndrome   7. Pharmacologic therapy   8. Chronic use of opiate for therapeutic purpose   9. Encounter for medication management      Updated Problems: No problems updated.  Plan of Care  Problem-specific:  No problem-specific Assessment & Plan notes found for this encounter.  Howard Murphy has a current medication list which includes the following long-term medication(s): atorvastatin, oxycodone-acetaminophen, [START ON 04/03/2021] oxycodone-acetaminophen, [START ON 05/03/2021] oxycodone-acetaminophen, zolpidem, and omeprazole.  Pharmacotherapy (Medications Ordered): Meds ordered this encounter  Medications   oxyCODONE-acetaminophen (PERCOCET) 10-325 MG tablet    Sig: Take 1 tablet by mouth every 8 (eight) hours as needed for pain. Must last 30 days.    Dispense:  90 tablet    Refill:  0    Not a duplicate. Do NOT delete! Dispense 1 day early if closed on fill date. Warn: no CNS-depressants within 8 hrs of opioid. Do not send refill request. Renewal requires appointment. No partial fills allowed   oxyCODONE-acetaminophen (PERCOCET) 10-325 MG tablet    Sig: Take 1 tablet by mouth every 8 (eight) hours as needed for pain. Must last 30 days.    Dispense:  90 tablet    Refill:  0    Not a duplicate. Do NOT delete! Dispense 1 day early if closed on fill date. Warn: no  CNS-depressants within 8 hrs of opioid. Do not send refill request. Renewal requires appointment. No partial fills allowed   oxyCODONE-acetaminophen (PERCOCET) 10-325 MG tablet    Sig: Take 1 tablet by mouth every 8 (eight) hours as needed  for pain. Must last 30 days.    Dispense:  90 tablet    Refill:  0    Not a duplicate. Do NOT delete! Dispense 1 day early if closed on fill date. Warn: no CNS-depressants within 8 hrs of opioid. Do not send refill request. Renewal requires appointment. No partial fills allowed    Orders:  No orders of the defined types were placed in this encounter.  Follow-up plan:   Return in about 3 months (around 06/02/2021) for Eval-day (M,W), (F2F), (MM).     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   Considering:   Diagnostic bilateral cervical facet block  Possible bilateral cervical facet RFA  Diagnostic right greater occipital NB  Possible right GON RFA  Possible right GON peripheral nerve stimulator trial  Diagnostic right CESI  Diagnostic bilateral IA shoulder injection  Diagnostic bilateral suprascapular NB  Possible bilateral suprascapular nerve RFA  Diagnostic bilateral SI joint block  Possible bilateral SI joint RFA  Diagnostic bilateral lumbar facet block  Possible bilateral lumbar facet RFA  Diagnostic left L4 TFESI  Diagnostic left L4-5 LESI  Diagnostic left IA hip joint injection  Possible left hip joint RFA  Diagnostic bilateral median nerve block (carpal tunnel block)  Diagnostic right CPB    Palliative PRN treatment(s):   Palliative/Diagnostic left SI Block (Area of L5 Transverse process spatulation) #2  Palliative IM Toradol/Norflex 60/60 (PRN)     Recent Visits Date Type Provider Dept  03/01/21 Office Visit Milinda Pointer, MD [facility]  Showing recent visits within past 90 days and meeting all other requirements Future Appointments Date Type Provider Dept  05/29/21 Appointment Milinda Pointer, MD [facility]  Showing future appointments within next 90 days and meeting all other requirements I discussed the assessment and treatment plan with the patient. The patient was provided an opportunity to ask questions and all were answered. The patient agreed with the plan and demonstrated an understanding of the instructions.  Patient advised to call back or seek an in-person evaluation if the symptoms or condition worsens.  Duration of encounter: 30 minutes.  Note by: Gaspar Cola, MD Date: 03/01/2021; Time: 1:18 PM

## 2021-03-01 ENCOUNTER — Ambulatory Visit: Payer: Medicare HMO | Attending: Pain Medicine | Admitting: Pain Medicine

## 2021-03-01 ENCOUNTER — Other Ambulatory Visit: Payer: Self-pay

## 2021-03-01 ENCOUNTER — Encounter: Payer: Self-pay | Admitting: Pain Medicine

## 2021-03-01 VITALS — BP 111/85 | HR 73 | Temp 96.6°F | Resp 18 | Ht 67.0 in | Wt 170.0 lb

## 2021-03-01 DIAGNOSIS — M79641 Pain in right hand: Secondary | ICD-10-CM | POA: Insufficient documentation

## 2021-03-01 DIAGNOSIS — Z79891 Long term (current) use of opiate analgesic: Secondary | ICD-10-CM | POA: Diagnosis present

## 2021-03-01 DIAGNOSIS — G5793 Unspecified mononeuropathy of bilateral lower limbs: Secondary | ICD-10-CM | POA: Insufficient documentation

## 2021-03-01 DIAGNOSIS — G8929 Other chronic pain: Secondary | ICD-10-CM | POA: Insufficient documentation

## 2021-03-01 DIAGNOSIS — M545 Low back pain, unspecified: Secondary | ICD-10-CM | POA: Diagnosis not present

## 2021-03-01 DIAGNOSIS — M961 Postlaminectomy syndrome, not elsewhere classified: Secondary | ICD-10-CM | POA: Insufficient documentation

## 2021-03-01 DIAGNOSIS — M25512 Pain in left shoulder: Secondary | ICD-10-CM | POA: Insufficient documentation

## 2021-03-01 DIAGNOSIS — Z79899 Other long term (current) drug therapy: Secondary | ICD-10-CM | POA: Diagnosis present

## 2021-03-01 DIAGNOSIS — M25511 Pain in right shoulder: Secondary | ICD-10-CM | POA: Insufficient documentation

## 2021-03-01 DIAGNOSIS — G894 Chronic pain syndrome: Secondary | ICD-10-CM | POA: Diagnosis present

## 2021-03-01 DIAGNOSIS — M79642 Pain in left hand: Secondary | ICD-10-CM | POA: Insufficient documentation

## 2021-03-01 MED ORDER — OXYCODONE-ACETAMINOPHEN 10-325 MG PO TABS: 1.0000 | ORAL_TABLET | Freq: Three times a day (TID) | ORAL | 0 refills | Status: DC | PRN

## 2021-03-01 NOTE — Progress Notes (Signed)
Nursing Pain Medication Assessment:  Safety precautions to be maintained throughout the outpatient stay will include: orient to surroundings, keep bed in low position, maintain call bell within reach at all times, provide assistance with transfer out of bed and ambulation.  Medication Inspection Compliance: Pill count conducted under aseptic conditions, in front of the patient. Neither the pills nor the bottle was removed from the patient's sight at any time. Once count was completed pills were immediately returned to the patient in their original bottle.  Medication: Oxycodone/APAP Pill/Patch Count:  0 of 90 pills remain Pill/Patch Appearance: Markings consistent with prescribed medication Bottle Appearance: Standard pharmacy container. Clearly labeled. Filled Date: 8 / 25 / 2022 Last Medication intake:  Today

## 2021-03-01 NOTE — Patient Instructions (Signed)
____________________________________________________________________________________________  Medication Rules  Purpose: To inform patients, and their family members, of our rules and regulations.  Applies to: All patients receiving prescriptions (written or electronic).  Pharmacy of record: Pharmacy where electronic prescriptions will be sent. If written prescriptions are taken to a different pharmacy, please inform the nursing staff. The pharmacy listed in the electronic medical record should be the one where you would like electronic prescriptions to be sent.  Electronic prescriptions: In compliance with the [place] Strengthen Opioid Misuse Prevention (STOP) Act of 2017 (Session Law 2017-74/H243), effective June 11, 2018, all controlled substances must be electronically prescribed. Calling prescriptions to the pharmacy will cease to exist.  Prescription refills: Only during scheduled appointments. Applies to all prescriptions.  NOTE: The following applies primarily to controlled substances (Opioid* Pain Medications).   Type of encounter (visit): For patients receiving controlled substances, face-to-face visits are required. (Not an option or up to the patient.)  Patient's responsibilities: Pain Pills: Bring all pain pills to every appointment (except for procedure appointments). Pill Bottles: Bring pills in original pharmacy bottle. Always bring the newest bottle. Bring bottle, even if empty. Medication refills: You are responsible for knowing and keeping track of what medications you take and those you need refilled. The day before your appointment: write a list of all prescriptions that need to be refilled. The day of the appointment: give the list to the admitting nurse. Prescriptions will be written only during appointments. No prescriptions will be written on procedure days. If you forget a medication: it will not be "Called in", "Faxed", or "electronically sent". You will  need to get another appointment to get these prescribed. No early refills. Do not call asking to have your prescription filled early. Prescription Accuracy: You are responsible for carefully inspecting your prescriptions before leaving our office. Have the discharge nurse carefully go over each prescription with you, before taking them home. Make sure that your name is accurately spelled, that your address is correct. Check the name and dose of your medication to make sure it is accurate. Check the number of pills, and the written instructions to make sure they are clear and accurate. Make sure that you are given enough medication to last until your next medication refill appointment. Taking Medication: Take medication as prescribed. When it comes to controlled substances, taking less pills or less frequently than prescribed is permitted and encouraged. Never take more pills than instructed. Never take medication more frequently than prescribed.  Inform other Doctors: Always inform, all of your healthcare providers, of all the medications you take. Pain Medication from other Providers: You are not allowed to accept any additional pain medication from any other Doctor or Healthcare provider. There are two exceptions to this rule. (see below) In the event that you require additional pain medication, you are responsible for notifying us, as stated below. Cough Medicine: Often these contain an opioid, such as codeine or hydrocodone. Never accept or take cough medicine containing these opioids if you are already taking an opioid* medication. The combination may cause respiratory failure and death. Medication Agreement: You are responsible for carefully reading and following our Medication Agreement. This must be signed before receiving any prescriptions from our practice. Safely store a copy of your signed Agreement. Violations to the Agreement will result in no further prescriptions. (Additional copies of our  Medication Agreement are available upon request.) Laws, Rules, & Regulations: All patients are expected to follow all Federal and State Laws, Statutes, Rules, & Regulations. Ignorance of   the Laws does not constitute a valid excuse.  Illegal drugs and Controlled Substances: The use of illegal substances (including, but not limited to marijuana and its derivatives) and/or the illegal use of any controlled substances is strictly prohibited. Violation of this rule may result in the immediate and permanent discontinuation of any and all prescriptions being written by our practice. The use of any illegal substances is prohibited. Adopted CDC guidelines & recommendations: Target dosing levels will be at or below 60 MME/day. Use of benzodiazepines** is not recommended.  Exceptions: There are only two exceptions to the rule of not receiving pain medications from other Healthcare Providers. Exception #1 (Emergencies): In the event of an emergency (i.e.: accident requiring emergency care), you are allowed to receive additional pain medication. However, you are responsible for: As soon as you are able, call our office [phone], at any time of the day or night, and leave a message stating your name, the date and nature of the emergency, and the name and dose of the medication prescribed. In the event that your call is answered by a member of our staff, make sure to document and save the date, time, and the name of the person that took your information.  Exception #2 (Planned Surgery): In the event that you are scheduled by another doctor or dentist to have any type of surgery or procedure, you are allowed (for a period no longer than 30 days), to receive additional pain medication, for the acute post-op pain. However, in this case, you are responsible for picking up a copy of our "Post-op Pain Management for Surgeons" handout, and giving it to your surgeon or dentist. This document is available at our office, and  does not require an appointment to obtain it. Simply go to our office during business hours (Monday-Thursday from 8:00 AM to 4:00 PM) (Friday 8:00 AM to 12:00 Noon) or if you have a scheduled appointment with us, prior to your surgery, and ask for it by name. In addition, you are responsible for: calling our office [phone], at any time of the day or night, and leaving a message stating your name, name of your surgeon, type of surgery, and date of procedure or surgery. Failure to comply with your responsibilities may result in termination of therapy involving the controlled substances.  *Opioid medications include: morphine, codeine, oxycodone, oxymorphone, hydrocodone, hydromorphone, meperidine, tramadol, tapentadol, buprenorphine, fentanyl, methadone. **Benzodiazepine medications include: diazepam (Valium), alprazolam (Xanax), clonazepam (Klonopine), lorazepam (Ativan), clorazepate (Tranxene), chlordiazepoxide (Librium), estazolam (Prosom), oxazepam (Serax), temazepam (Restoril), triazolam (Halcion) (Last updated: 05/09/2020) ____________________________________________________________________________________________  ____________________________________________________________________________________________  Medication Recommendations and Reminders  Applies to: All patients receiving prescriptions (written and/or electronic).  Medication Rules & Regulations: These rules and regulations exist for your safety and that of others. They are not flexible and neither are we. Dismissing or ignoring them will be considered "non-compliance" with medication therapy, resulting in complete and irreversible termination of such therapy. (See document titled "Medication Rules" for more details.) In all conscience, because of safety reasons, we cannot continue providing a therapy where the patient does not follow instructions.  Pharmacy of record:  Definition: This is the pharmacy where your electronic  prescriptions will be sent.  We do not endorse any particular pharmacy, however, we have experienced problems with [facility] not securing enough medication supply for the community. We do not restrict you in your choice of pharmacy. However, once we write for your prescriptions, we will NOT be re-sending more prescriptions to fix restricted supply problems   created by your pharmacy, or your insurance.  The pharmacy listed in the electronic medical record should be the one where you want electronic prescriptions to be sent. If you choose to change pharmacy, simply notify our nursing staff.  Recommendations: Keep all of your pain medications in a safe place, under lock and key, even if you live alone. We will NOT replace lost, stolen, or damaged medication. After you fill your prescription, take 1 week's worth of pills and put them away in a safe place. You should keep a separate, properly labeled bottle for this purpose. The remainder should be kept in the original bottle. Use this as your primary supply, until it runs out. Once it's gone, then you know that you have 1 week's worth of medicine, and it is time to come in for a prescription refill. If you do this correctly, it is unlikely that you will ever run out of medicine. To make sure that the above recommendation works, it is very important that you make sure your medication refill appointments are scheduled at least 1 week before you run out of medicine. To do this in an effective manner, make sure that you do not leave the office without scheduling your next medication management appointment. Always ask the nursing staff to show you in your prescription , when your medication will be running out. Then arrange for the receptionist to get you a return appointment, at least 7 days before you run out of medicine. Do not wait until you have 1 or 2 pills left, to come in. This is very poor planning and does not take into consideration that we may need to  cancel appointments due to bad weather, sickness, or emergencies affecting our staff. DO NOT ACCEPT A "Partial Fill": If for any reason your pharmacy does not have enough pills/tablets to completely fill or refill your prescription, do not allow for a "partial fill". The law allows the pharmacy to complete that prescription within 72 hours, without requiring a new prescription. If they do not fill the rest of your prescription within those 72 hours, you will need a separate prescription to fill the remaining amount, which we will NOT provide. If the reason for the partial fill is your insurance, you will need to talk to the pharmacist about payment alternatives for the remaining tablets, but again, DO NOT ACCEPT A PARTIAL FILL, unless you can trust your pharmacist to obtain the remainder of the pills within 72 hours.  Prescription refills and/or changes in medication(s):  Prescription refills, and/or changes in dose or medication, will be conducted only during scheduled medication management appointments. (Applies to both, written and electronic prescriptions.) No refills on procedure days. No medication will be changed or started on procedure days. No changes, adjustments, and/or refills will be conducted on a procedure day. Doing so will interfere with the diagnostic portion of the procedure. No phone refills. No medications will be "called into the pharmacy". No Fax refills. No weekend refills. No Holliday refills. No after hours refills.  Remember:  Business hours are:  Monday to Thursday 8:00 AM to 4:00 PM Provider's Schedule: Torie Priebe, MD - Appointments are:  Medication management: Monday and Wednesday 8:00 AM to 4:00 PM Procedure day: Tuesday and Thursday 7:30 AM to 4:00 PM Bilal Lateef, MD - Appointments are:  Medication management: Tuesday and Thursday 8:00 AM to 4:00 PM Procedure day: Monday and Wednesday 7:30 AM to 4:00 PM (Last update:  12/30/2019) ____________________________________________________________________________________________  ____________________________________________________________________________________________  CBD (cannabidiol) WARNING    Applicable to: All individuals currently taking or considering taking CBD (cannabidiol) and, more important, all patients taking opioid analgesic controlled substances (pain medication). (Example: oxycodone; oxymorphone; hydrocodone; hydromorphone; morphine; methadone; tramadol; tapentadol; fentanyl; buprenorphine; butorphanol; dextromethorphan; meperidine; codeine; etc.)  Legal status: CBD remains a Schedule I drug prohibited for any use. CBD is illegal with one exception. In the [place], CBD has a limited Food and Drug Administration (FDA) approval for the treatment of two specific types of epilepsy disorders. Only one CBD product has been approved by the FDA for this purpose: "Epidiolex". FDA is aware that some companies are marketing products containing cannabis and cannabis-derived compounds in ways that violate the Federal Food, Drug and Cosmetic Act (FD&C Act) and that may put the health and safety of consumers at risk. The FDA, a Federal agency, has not enforced the CBD status since 2018.   Legality: Some manufacturers ship CBD products nationally, which is illegal. Often such products are sold online and are therefore available throughout the country. CBD is openly sold in head shops and health food stores in some states where such sales have not been explicitly legalized. Selling unapproved products with unsubstantiated therapeutic claims is not only a violation of the law, but also can put patients at risk, as these products have not been proven to be safe or effective. Federal illegality makes it difficult to conduct research on CBD.  Reference: "FDA Regulation of Cannabis and Cannabis-Derived Products, Including Cannabidiol (CBD)" -  https://www.fda.gov/news-events/public-health-focus/fda-regulation-cannabis-and-cannabis-derived-products-including-cannabidiol-cbd  Warning: CBD is not FDA approved and has not undergo the same manufacturing controls as prescription drugs.  This means that the purity and safety of available CBD may be questionable. Most of the time, despite manufacturer's claims, it is contaminated with THC (delta-9-tetrahydrocannabinol - the chemical in marijuana responsible for the "HIGH").  When this is the case, the THC contaminant will trigger a positive urine drug screen (UDS) test for Marijuana (carboxy-THC). Because a positive UDS for any illicit substance is a violation of our medication agreement, your opioid analgesics (pain medicine) may be permanently discontinued.  MORE ABOUT CBD  General Information: CBD  is a derivative of the Marijuana (cannabis sativa) plant discovered in 1940. It is one of the 113 identified substances found in Marijuana. It accounts for up to 40% of the plant's extract. As of 2018, preliminary clinical studies on CBD included research for the treatment of anxiety, movement disorders, and pain. CBD is available and consumed in multiple forms, including inhalation of smoke or vapor, as an aerosol spray, and by mouth. It may be supplied as an oil containing CBD, capsules, dried cannabis, or as a liquid solution. CBD is thought not to be as psychoactive as THC (delta-9-tetrahydrocannabinol - the chemical in marijuana responsible for the "HIGH"). Studies suggest that CBD may interact with different biological target receptors in the body, including cannabinoid and other neurotransmitter receptors. As of 2018 the mechanism of action for its biological effects has not been determined.  Side-effects  Adverse reactions: Dry mouth, diarrhea, decreased appetite, fatigue, drowsiness, malaise, weakness, sleep disturbances, and others.  Drug interactions: CBC may interact with other medications  such as blood-thinners. (Last update: 01/16/2020) ____________________________________________________________________________________________  ____________________________________________________________________________________________  Drug Holidays (Slow)  What is a "Drug Holiday"? Drug Holiday: is the name given to the period of time during which a patient stops taking a medication(s) for the purpose of eliminating tolerance to the drug.  Benefits Improved effectiveness of opioids. Decreased opioid dose needed to achieve benefits. Improved pain with lesser dose.    What is tolerance? Tolerance: is the progressive decreased in effectiveness of a drug due to its repetitive use. With repetitive use, the body gets use to the medication and as a consequence, it loses its effectiveness. This is a common problem seen with opioid pain medications. As a result, a larger dose of the drug is needed to achieve the same effect that used to be obtained with a smaller dose.  How long should a "Drug Holiday" last? You should stay off of the pain medicine for at least 14 consecutive days. (2 weeks)  Should I stop the medicine "cold turkey"? No. You should always coordinate with your Pain Specialist so that he/she can provide you with the correct medication dose to make the transition as smoothly as possible.  How do I stop the medicine? Slowly. You will be instructed to decrease the daily amount of pills that you take by one (1) pill every seven (7) days. This is called a "slow downward taper" of your dose. For example: if you normally take four (4) pills per day, you will be asked to drop this dose to three (3) pills per day for seven (7) days, then to two (2) pills per day for seven (7) days, then to one (1) per day for seven (7) days, and at the end of those last seven (7) days, this is when the "Drug Holiday" would start.   Will I have withdrawals? By doing a "slow downward taper" like this one, it  is unlikely that you will experience any significant withdrawal symptoms. Typically, what triggers withdrawals is the sudden stop of a high dose opioid therapy. Withdrawals can usually be avoided by slowly decreasing the dose over a prolonged period of time. If you do not follow these instructions and decide to stop your medication abruptly, withdrawals may be possible.  What are withdrawals? Withdrawals: refers to the wide range of symptoms that occur after stopping or dramatically reducing opiate drugs after heavy and prolonged use. Withdrawal symptoms do not occur to patients that use low dose opioids, or those who take the medication sporadically. Contrary to benzodiazepine (example: Valium, Xanax, etc.) or alcohol withdrawals ("Delirium Tremens"), opioid withdrawals are not lethal. Withdrawals are the physical manifestation of the body getting rid of the excess receptors.  Expected Symptoms Early symptoms of withdrawal may include: Agitation Anxiety Muscle aches Increased tearing Insomnia Runny nose Sweating Yawning  Late symptoms of withdrawal may include: Abdominal cramping Diarrhea Dilated pupils Goose bumps Nausea Vomiting  Will I experience withdrawals? Due to the slow nature of the taper, it is very unlikely that you will experience any.  What is a slow taper? Taper: refers to the gradual decrease in dose.  (Last update: 12/30/2019) ____________________________________________________________________________________________    

## 2021-05-26 NOTE — Progress Notes (Addendum)
PROVIDER NOTE: Information contained herein reflects review and annotations entered in association with encounter. Interpretation of such information and data should be left to medically-trained personnel. Information provided to patient can be located elsewhere in the medical record under "Patient Instructions". Document created using STT-dictation technology, any transcriptional errors that may result from process are unintentional.    Patient: Howard Murphy  Service Category: E/M  Provider: Gaspar Cola, MD  DOB: 10-27-1955  DOS: 05/29/2021  Specialty: Interventional Pain Management  MRN: [id number]  Setting: Ambulatory outpatient  PCP: [facility]  Type: Established Patient    Referring Provider: [facility]  Location: Office  Delivery: Face-to-face     HPI  Howard Murphy, a 65 y.o. year old male, is here today because of his Chronic bilateral low back pain without sciatica [M54.50, G89.29]. Howard Murphy primary complain today is Back Pain (lower) Last encounter: My last encounter with him was on 03/01/2021. Pertinent problems: Howard Murphy has History of lumbar facet Synovial cyst, surgically removed.; Chronic abdominal pain (RUQ); Type 2 diabetes mellitus ([place]); Spondylosis of lumbar spine; Lumbar facet joint syndrome ([place]); Chronic hip pain (Left); Diabetic peripheral neuropathy ([place]); Chronic lumbar radicular pain (S1 Dermatome) (Bilateral) (L>R); Complete tear of the shoulder rotator cuff (Left); Chronic shoulder pain (2ry area of Pain) (Bilateral) (L>R); Chronic foot pain (3ry area of Pain) (Bilateral) (L>R); Chronic hand pain (4th area of Pain) (Bilateral) (R>L); Lumbar facet hypertrophy; Occipital pain (Right); Failed back surgical syndrome 4; Chronic low back pain (1ry area of Pain) (Bilateral) (L>R) w/o sciatica; Radicular pain of shoulder (Right); Carpal tunnel syndrome (Bilateral); Chronic cervical radicular pain (Right); Abnormal MRI, lumbar  spine (12/23/2019); Chronic sacroiliac joint pain (Bilateral) (L>R); Neurogenic pain; Cervical spondylosis; Cervical facet arthropathy; Chronic neck pain (Bilateral) (R>L); Cervical facet syndrome (Bilateral) (R>L); Chronic upper extremity pain (Bilateral) (R>L); Cervical foraminal stenosis;  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, C5-6); Arthropathy of shoulder (Right); 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); Lumbar foraminal stenosis  (Left: L4-5) (Right: L1-2); Grade 1 Anterolisthesis (2 mm) of L4 over L5.; Lumbar facet (L4-5) synovial cyst (6 mm) (Left); Chronic pain syndrome; Spondylolisthesis at L3-L4 level; Complete tear of left rotator cuff; Bertolotti's syndrome (L5-S1) (Bilateral); 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); Other specified dorsopathies, sacral and sacrococcygeal region; Spondylosis without myelopathy or radiculopathy, lumbosacral region; Chronic bilateral low back pain with bilateral sciatica; Neurogenic claudication ([place]); Osteoarthritis involving multiple joints; Chronic musculoskeletal pain; Acute exacerbation of chronic low back pain; Grade 1 Retrolisthesis of L1/L2; Lumbar disc extrusion with 7 mm caudal migration (L1-2); Lumbosacral radiculopathy at L1 (Right); Groin pain (Right); 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 Pain Assessment: Severity of Chronic pain is reported as a 6 /10. Location: Back Right, Left/pain radiatie little bit. Onset: More than a month ago. Quality: Burning, Constant. Timing: Constant. Modifying factor(s): meds, heating pad. Vitals:  height is '5\' 6"'  (1.676 m) and weight is 170 lb (77.1 kg). His temperature is 97 F (36.1 C) (abnormal).  His blood pressure is 128/80 and his pulse is 64. His oxygen saturation is 96%.   Reason for encounter: medication management.   The patient indicates doing well with the current medication regimen. No adverse reactions or side effects reported to the medications.  The patient had  his back surgery done on 09/14/2020 and 10/22/2020 by Dr. Cari Caraway.  We had previously talked about going down on the opioids and eventually stopping them.  Today he was reminded that this is still are planned and that we will begin that decrease on his next visit.  This had already been informed to the patient to be the plan on 08/29/2020.  The patient indicates still having a little bit of pain in the right lower back that he thinks may be associated with his Bertolotti syndrome.  He indicated that he has an upcoming appointment with Dr. Cari Caraway where he wants to go over what is of the may be done surgically to "fix the problem".  Today I had the opportunity to communicate with Dr. Cari Caraway and he clarified that he is fused at L5-S1 and its not likely that he will be benefiting from anything else done in that area.  He commented that perhaps a spinal cord stimulator could be an alternative.  RTCB: 08/31/2021 Nonopioids transferred 03/16/2021: Melatonin  Pharmacotherapy Assessment  Analgesic: Oxycodone IR 10 mg 1 tab PO q 8 hrs (30 mg/day of oxycodone) MME/day: 45 mg/day.   Monitoring: [place] PMP: PDMP reviewed during this encounter.       Pharmacotherapy: No side-effects or adverse reactions reported. Compliance: No problems identified. Effectiveness: Clinically acceptable.  Chauncey Fischer, RN  05/29/2021  8:51 AM  Signed Nursing Pain Medication Assessment:  Safety precautions to be maintained throughout the outpatient stay will include: orient to surroundings, keep bed in low position, maintain call bell within reach at all times, provide assistance with transfer out of bed and ambulation.  Medication Inspection  Compliance: Pill count conducted under aseptic conditions, in front of the patient. Neither the pills nor the bottle was removed from the patient's sight at any time. Once count was completed pills were immediately returned to the patient in their original bottle.  Medication: Oxycodone/APAP Pill/Patch Count:  11 of 90 pills remain Pill/Patch Appearance: Markings consistent with prescribed medication Bottle Appearance: Standard pharmacy container. Clearly labeled. Filled Date: 25 / 23 / 2022 Last Medication intake:  TodaySafety precautions to be maintained throughout the outpatient stay will include: orient to surroundings, keep bed in low position, maintain call bell within reach at all times, provide assistance with transfer out of bed and ambulation.     UDS:  Summary  Date Value Ref Range Status  11/23/2020 Note  Final    Comment:    ==================================================================== [employer] (MW) ==================================================================== Test                             Result       Flag       Units  Drug Present and Declared for Prescription Verification   Oxycodone                      2848         EXPECTED   ng/mg creat   Oxymorphone                    7410         EXPECTED   ng/mg creat   Noroxycodone                   4432         EXPECTED   ng/mg creat   Noroxymorphone  1613         EXPECTED   ng/mg creat    Sources of oxycodone are scheduled prescription medications.    Oxymorphone, noroxycodone, and noroxymorphone are expected    metabolites of oxycodone. Oxymorphone is also available as a    scheduled prescription medication.  ==================================================================== Test                      Result    Flag   Units      Ref Range   Creatinine              104              mg/dL      >=20 ==================================================================== Declared  Medications:  The flagging and interpretation on this report are based on the  following declared medications.  Unexpected results may arise from  inaccuracies in the declared medications.   **Note: The testing scope of this panel includes these medications:   Oxycodone (Percocet)   **Note: The testing scope of this panel does not include the  following reported medications:   Acetaminophen (Tylenol)  Acetaminophen (Percocet)  Atorvastatin (Lipitor)  Cyclobenzaprine (Flexeril)  Empagliflozin (Jardiance)  Meclizine (Antivert)  Omeprazole (Prilosec)  Sennosides (Senokot)  Sitagliptin (Januvia)  Tamsulosin (Flomax)  Testosterone  Zolpidem (Ambien) ==================================================================== For clinical consultation, please call [phone]. ====================================================================      ROS  Constitutional: Denies any fever or chills Gastrointestinal: No reported hemesis, hematochezia, vomiting, or acute GI distress Musculoskeletal: Denies any acute onset joint swelling, redness, loss of ROM, or weakness Neurological: No reported episodes of acute onset apraxia, aphasia, dysarthria, agnosia, amnesia, paralysis, loss of coordination, or loss of consciousness  Medication Review  UNABLE TO FIND, acetaminophen, atorvastatin, cyclobenzaprine, empagliflozin, meclizine, omeprazole, oxyCODONE-acetaminophen, senna-docusate, sitaGLIPtin, tamsulosin, testosterone cypionate, and zolpidem  History Review  Allergy: Howard Murphy is allergic to adhesive [tape]. Drug: Howard Murphy  reports no history of drug use. Alcohol:  reports no history of alcohol use. Tobacco:  reports that he has never smoked. He quit smokeless tobacco use about 8 years ago.  His smokeless tobacco use included chew. Social: Howard Murphy  reports that he has never smoked. He quit smokeless tobacco use about 8 years ago.  His smokeless tobacco use included chew. He  reports that he does not drink alcohol and does not use drugs. Medical:  has a past medical history of Arthritis, Back ache (05/05/2013), Diabetes mellitus without complication ([place]), GERD (gastroesophageal reflux disease), Headache, History of kidney stones, Hyperlipemia, Hypertension, Kidney stones, Kidney stones, Nephrolithiasis, and Shortness of breath dyspnea. Surgical: Howard Murphy  has a past surgical history that includes Cholecystectomy (2000); Shoulder surgery (Right, 2016); Colonoscopy with propofol (N/A, 04/11/2015); Esophagogastroduodenoscopy (egd) with propofol (N/A, 04/11/2015); Shoulder surgery (Left, 06/2015); Back surgery (2015); Back surgery (08/09/2016); Cystoscopy with ureteroscopy and stent placement (Right, 08/27/2017); laparoscopic appendectomy (N/A, 12/18/2017); Appendectomy; Back surgery (Bilateral, 2019); Posterior lumbar fusion 4 level (N/A, 09/14/2020); and Incision and drainage (N/A, 10/22/2020). Family: family history includes Cancer in his maternal grandmother, mother, sister, and sister; Heart disease in his father; Stroke in his father.  Laboratory Chemistry Profile   Renal Lab Results  Component Value Date   BUN 16 10/27/2020   CREATININE 0.82 10/27/2020   LABCREA 182 02/24/2016   BCR 14 03/07/2017   GFRAA >60 12/22/2019   GFRNONAA >60 10/27/2020    Hepatic Lab Results  Component Value Date   AST 29 12/22/2019   ALT 36 12/22/2019  ALBUMIN 4.3 12/22/2019   ALKPHOS 68 12/22/2019   LIPASE 46 12/03/2018    Electrolytes Lab Results  Component Value Date   NA 138 10/27/2020   K 4.1 10/27/2020   CL 106 10/27/2020   CALCIUM 8.7 (L) 10/27/2020   MG 2.0 10/27/2020    Bone Lab Results  Component Value Date   25OHVITD1 24 (L) 11/29/2016   25OHVITD2 <1.0 11/29/2016   25OHVITD3 24 11/29/2016    Inflammation (CRP: Acute Phase) (ESR: Chronic Phase) Lab Results  Component Value Date   CRP 2.4 (H) 10/22/2020   ESRSEDRATE 10 10/22/2020   LATICACIDVEN 1.0  09/25/2020         Note: Above Lab results reviewed.  Recent Imaging Review  MR THORACIC SPINE W WO CONTRAST CLINICAL DATA:  Initial evaluation for acute back pain, concern for midline wound infection, redness and drainage.  EXAM: MRI THORACIC AND LUMBAR SPINE WITHOUT AND WITH CONTRAST  TECHNIQUE: Multiplanar and multiecho pulse sequences of the thoracic and lumbar spine were obtained without and with intravenous contrast.  CONTRAST:  7.58m GADAVIST GADOBUTROL 1 MMOL/ML IV SOLN  COMPARISON:  Prior CT from earlier the same day as well as previous studies.  FINDINGS: MRI THORACIC SPINE FINDINGS  Alignment: Trace retrolisthesis of T11 on T12. Underlying trace dextroscoliosis.  Vertebrae: Mild chronic wedging of T11 again noted, stable. Vertebral body height otherwise maintained without acute or subacute fracture. Susceptibility artifact related to prior posterior fusion extending from T11 through the sacrum. Hardware better evaluated on prior CT. Underlying bone marrow signal intensity within normal limits. No worrisome osseous lesions. No findings to suggest osteomyelitis discitis or septic arthritis.  Cord: Evaluation of the distal cord limited by adjacent susceptibility effect. Visualized portions are normal without signal abnormality. No epidural abscess or other collection.  Paraspinal and other soft tissues: Postoperative changes from recent fusion present within the lower posterior paraspinous soft tissues. Small collection measuring 5 x 8 x 21 mm seen along the midline incision at the level of T11 (series 22, image 34). No significant subfascial extension. Finding favored to reflect a small postoperative seroma. Superimposed infection not excluded, and could be considered in the correct clinical setting. No other loculated collections. Visualized lungs are grossly clear. Visualized visceral structures unremarkable.  Disc levels:  C7-T1: Small central disc  protrusion indents the ventral thecal sac without significant spinal stenosis (series 22, image 2).  No other significant disc pathology seen within the thoracic spine. No spinal stenosis. She left-sided facet arthrosis at T5-6 through T8-9 with resultant moderate left foraminal stenosis at T5-6, with more mild narrowing at the remaining levels again noted. Mild right foraminal narrowing at T6-7 due to facet arthropathy.  MRI LUMBAR SPINE FINDINGS  Segmentation: Transitional lumbosacral anatomy with a lumbarized S1 segment. Vertebral body count is made from the dens, with same numbering system utilized as on previous exams.  Alignment: 3-4 mm retrolisthesis of L2 on L3, stable. Alignment otherwise normal with preservation of the normal lumbar lordosis.  Vertebrae: Extensive susceptibility artifact related to prior fusion at T11 through the sacrum. Interbody devices in place at L3-4, L4-5, and L5-S1. Hardware better evaluated on prior CT. Vertebral body height maintained without acute fracture. Underlying bone marrow signal intensity within normal limits. No worrisome osseous lesions. Heterogeneous signal abnormality about the L2-3 interspace felt to be most consistent with reactive discogenic change. No other signal abnormality. No findings to suggest osteomyelitis discitis or septic arthritis.  Conus medullaris: Extends to the L1-2 level and appears normal.  No epidural collections.  Paraspinal and other soft tissues: Extensive postoperative changes present throughout the lower posterior paraspinous musculature and soft tissues. Small collection at the right laminectomy site at L2-3 measures 2.5 x 2.4 cm (series 28, image 5). No significant regional mass effect or surrounding edema. Finding favored to reflect a benign postoperative seroma. No other discrete or loculated collections. Moderate distension of the partially visualized urinary bladder noted. Retroaortic left renal  vein noted.  Disc levels:  L1-2: Seen only on sagittal projection. Minimal annular disc bulge. Prior posterior fusion. No stenosis.  L2-3: Retrolisthesis. Advanced degenerative intervertebral disc space narrowing with disc desiccation, diffuse disc bulge, and reactive endplate change, worse on the right. Prior posterior fusion with right laminectomy. No significant residual spinal stenosis. Foramina appear patent.  L3-4: Prior posterior and interbody fusion. Bilateral facet hypertrophy. No significant residual spinal stenosis. Foramina appear patent.  L4-5: Prior posterior and interbody fusion with posterior decompression. No residual spinal stenosis. Foramina appear patent.  L5-S1: Prior posterior and interbody fusion with posterior decompression. No residual canal or foraminal stenosis.  S1-2: Negative interspace. Prior posterior fusion. No residual spinal stenosis. Foramina appear patent.  IMPRESSION: 1. Postoperative changes from prior fusion at T11 through the sacrum. Extensive postoperative changes throughout the posterior paraspinous soft tissues with superimposed small 5 x 8 x 21 mm collection along the midline incision at the level of T11. Finding favored to reflect a small postoperative seroma, although superimposed infection not excluded, and could be considered in the correct clinical setting. 2. Additional 2.5 x 2.4 cm collection along the right laminectomy site at L2-3, also favored to reflect a postoperative seroma. No significant mass effect. 3. No other evidence for acute infection within the thoracic or lumbar spine. 4. Extensive postoperative changes from prior fusion at T11 through the sacrum. No residual or recurrent spinal stenosis.  Electronically Signed   By: Jeannine Boga M.D.   On: 10/22/2020 03:16 MR Lumbar Spine W Wo Contrast CLINICAL DATA:  Initial evaluation for acute back pain, concern for midline wound infection, redness and  drainage.  EXAM: MRI THORACIC AND LUMBAR SPINE WITHOUT AND WITH CONTRAST  TECHNIQUE: Multiplanar and multiecho pulse sequences of the thoracic and lumbar spine were obtained without and with intravenous contrast.  CONTRAST:  7.69m GADAVIST GADOBUTROL 1 MMOL/ML IV SOLN  COMPARISON:  Prior CT from earlier the same day as well as previous studies.  FINDINGS: MRI THORACIC SPINE FINDINGS  Alignment: Trace retrolisthesis of T11 on T12. Underlying trace dextroscoliosis.  Vertebrae: Mild chronic wedging of T11 again noted, stable. Vertebral body height otherwise maintained without acute or subacute fracture. Susceptibility artifact related to prior posterior fusion extending from T11 through the sacrum. Hardware better evaluated on prior CT. Underlying bone marrow signal intensity within normal limits. No worrisome osseous lesions. No findings to suggest osteomyelitis discitis or septic arthritis.  Cord: Evaluation of the distal cord limited by adjacent susceptibility effect. Visualized portions are normal without signal abnormality. No epidural abscess or other collection.  Paraspinal and other soft tissues: Postoperative changes from recent fusion present within the lower posterior paraspinous soft tissues. Small collection measuring 5 x 8 x 21 mm seen along the midline incision at the level of T11 (series 22, image 34). No significant subfascial extension. Finding favored to reflect a small postoperative seroma. Superimposed infection not excluded, and could be considered in the correct clinical setting. No other loculated collections. Visualized lungs are grossly clear. Visualized visceral structures unremarkable.  Disc levels:  C7-T1: Small  central disc protrusion indents the ventral thecal sac without significant spinal stenosis (series 22, image 2).  No other significant disc pathology seen within the thoracic spine. No spinal stenosis. She left-sided facet arthrosis at  T5-6 through T8-9 with resultant moderate left foraminal stenosis at T5-6, with more mild narrowing at the remaining levels again noted. Mild right foraminal narrowing at T6-7 due to facet arthropathy.  MRI LUMBAR SPINE FINDINGS  Segmentation: Transitional lumbosacral anatomy with a lumbarized S1 segment. Vertebral body count is made from the dens, with same numbering system utilized as on previous exams.  Alignment: 3-4 mm retrolisthesis of L2 on L3, stable. Alignment otherwise normal with preservation of the normal lumbar lordosis.  Vertebrae: Extensive susceptibility artifact related to prior fusion at T11 through the sacrum. Interbody devices in place at L3-4, L4-5, and L5-S1. Hardware better evaluated on prior CT. Vertebral body height maintained without acute fracture. Underlying bone marrow signal intensity within normal limits. No worrisome osseous lesions. Heterogeneous signal abnormality about the L2-3 interspace felt to be most consistent with reactive discogenic change. No other signal abnormality. No findings to suggest osteomyelitis discitis or septic arthritis.  Conus medullaris: Extends to the L1-2 level and appears normal. No epidural collections.  Paraspinal and other soft tissues: Extensive postoperative changes present throughout the lower posterior paraspinous musculature and soft tissues. Small collection at the right laminectomy site at L2-3 measures 2.5 x 2.4 cm (series 28, image 5). No significant regional mass effect or surrounding edema. Finding favored to reflect a benign postoperative seroma. No other discrete or loculated collections. Moderate distension of the partially visualized urinary bladder noted. Retroaortic left renal vein noted.  Disc levels:  L1-2: Seen only on sagittal projection. Minimal annular disc bulge. Prior posterior fusion. No stenosis.  L2-3: Retrolisthesis. Advanced degenerative intervertebral disc space narrowing with disc  desiccation, diffuse disc bulge, and reactive endplate change, worse on the right. Prior posterior fusion with right laminectomy. No significant residual spinal stenosis. Foramina appear patent.  L3-4: Prior posterior and interbody fusion. Bilateral facet hypertrophy. No significant residual spinal stenosis. Foramina appear patent.  L4-5: Prior posterior and interbody fusion with posterior decompression. No residual spinal stenosis. Foramina appear patent.  L5-S1: Prior posterior and interbody fusion with posterior decompression. No residual canal or foraminal stenosis.  S1-2: Negative interspace. Prior posterior fusion. No residual spinal stenosis. Foramina appear patent.  IMPRESSION: 1. Postoperative changes from prior fusion at T11 through the sacrum. Extensive postoperative changes throughout the posterior paraspinous soft tissues with superimposed small 5 x 8 x 21 mm collection along the midline incision at the level of T11. Finding favored to reflect a small postoperative seroma, although superimposed infection not excluded, and could be considered in the correct clinical setting. 2. Additional 2.5 x 2.4 cm collection along the right laminectomy site at L2-3, also favored to reflect a postoperative seroma. No significant mass effect. 3. No other evidence for acute infection within the thoracic or lumbar spine. 4. Extensive postoperative changes from prior fusion at T11 through the sacrum. No residual or recurrent spinal stenosis.  Electronically Signed   By: Jeannine Boga M.D.   On: 10/22/2020 03:16 CT Thoracic Spine Wo Contrast CLINICAL DATA:  Initial evaluation for low back pain, infection or inflammation suspected.  EXAM: CT THORACIC AND LUMBAR SPINE WITHOUT CONTRAST  TECHNIQUE: Multidetector CT imaging of the thoracic and lumbar spine was performed without contrast. Multiplanar CT image reconstructions were also generated.  COMPARISON:  Prior study  from 09/26/2020.  FINDINGS: CT THORACIC  SPINE FINDINGS  Alignment: Trace retrolisthesis of T11 on T12, stable. Alignment otherwise normal.  Vertebrae: No acute or interval fracture. No discrete or worrisome osseous lesions. Mild chronic anterior wedging of the T11 vertebral body, stable. Posterior fusion extending from T11 through the sacrum. Hardware remains well positioned without evidence for loosening or other complication.  Paraspinal and other soft tissues: Mild postoperative stranding present within the posterior paraspinous soft tissues extending from T8-9 inferiorly. No discrete collections evident by CT. Remainder of the paraspinous soft tissues otherwise unremarkable. Partially visualized lungs are clear.  Disc levels: Anterior bridging osteophytes extending from T7 through L1. No new or progressive spinal stenosis. Left-sided facet arthrosis at T5-6-T8-9 with resultant moderate left foraminal stenosis at T5-6, with more mild narrowing at the remaining levels. Mild right foraminal narrowing at T6-7 due to facet arthrosis. Appearance is stable.  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 Same numbering system employed as on previous exam.  Alignment: Mild retrolisthesis of L2 on L3, stable. Alignment otherwise normal.  Vertebrae: Posterior fusion extending from T11 through the sacrum. Hardware in stable position and is well aligned without loosening or other complication. Sequelae of prior screw removal on the right again noted at L5. Bilateral iliac screws remain in place traversing both SI joints. Interbody implants in place at L3-4, L4-5, and L5-S1, stable in position. Vertebral body height maintained without fracture. No discrete or worrisome osseous lesions.  Paraspinal and other soft tissues: Postoperative changes within the posterior paraspinous soft tissues with expected postoperative stranding.  No visible collections evident by CT. Aortic atherosclerosis noted.  Disc levels:  L1-2: Posterior fusion. Residual disc bulge with facet hypertrophy without significant stenosis.  L2-3: Advanced degenerative intervertebral disc space narrowing with disc bulge, disc desiccation, and reactive endplate change, worse on the right. Associated degenerative sclerosis. Prior right laminectomy. Residual bilateral facet arthrosis. No spinal stenosis. Mild bilateral neural foraminal stenosis due to spurring, stable.  L3-4 through S1-2. Previous fusion with similar appearance to previous exam. No significant residual stenosis.  IMPRESSION: 1. Postoperative changes from prior posterior fusion from T11 through the sacrum, stable from previous without complication. Similar postoperative changes within the posterior paraspinous soft tissues without visible collection by CT. 2. 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 3. Aortic Atherosclerosis (ICD10-I70.0).  Electronically Signed   By: Jeannine Boga M.D.   On: 10/22/2020 01:09 CT Lumbar Spine Wo Contrast CLINICAL DATA:  Initial evaluation for low back pain, infection or inflammation suspected.  EXAM: CT THORACIC AND LUMBAR SPINE WITHOUT CONTRAST  TECHNIQUE: Multidetector CT imaging of the thoracic and lumbar spine was performed without contrast. Multiplanar CT image reconstructions were also generated.  COMPARISON:  Prior study from 09/26/2020.  FINDINGS: CT THORACIC SPINE FINDINGS  Alignment: Trace retrolisthesis of T11 on T12, stable. Alignment otherwise normal.  Vertebrae: No acute or interval fracture. No discrete or worrisome osseous lesions. Mild chronic anterior wedging of the T11 vertebral body, stable. Posterior fusion extending from T11 through the sacrum. Hardware remains well positioned without evidence for loosening or other complication.  Paraspinal  and other soft tissues: Mild postoperative stranding present within the posterior paraspinous soft tissues extending from T8-9 inferiorly. No discrete collections evident by CT. Remainder of the paraspinous soft tissues otherwise unremarkable. Partially visualized lungs are clear.  Disc levels: Anterior bridging osteophytes extending from T7 through L1. No new or progressive spinal stenosis. Left-sided facet arthrosis at T5-6-T8-9 with resultant moderate left  foraminal stenosis at T5-6, with more mild narrowing at the remaining levels. Mild right foraminal narrowing at T6-7 due to facet arthrosis. Appearance is stable.  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 Same numbering system employed as on previous exam.  Alignment: Mild retrolisthesis of L2 on L3, stable. Alignment otherwise normal.  Vertebrae: Posterior fusion extending from T11 through the sacrum. Hardware in stable position and is well aligned without loosening or other complication. Sequelae of prior screw removal on the right again noted at L5. Bilateral iliac screws remain in place traversing both SI joints. Interbody implants in place at L3-4, L4-5, and L5-S1, stable in position. Vertebral body height maintained without fracture. No discrete or worrisome osseous lesions.  Paraspinal and other soft tissues: Postoperative changes within the posterior paraspinous soft tissues with expected postoperative stranding. No visible collections evident by CT. Aortic atherosclerosis noted.  Disc levels:  L1-2: Posterior fusion. Residual disc bulge with facet hypertrophy without significant stenosis.  L2-3: Advanced degenerative intervertebral disc space narrowing with disc bulge, disc desiccation, and reactive endplate change, worse on the right. Associated degenerative sclerosis. Prior right laminectomy. Residual bilateral facet arthrosis. No spinal  stenosis. Mild bilateral neural foraminal stenosis due to spurring, stable.  L3-4 through S1-2. Previous fusion with similar appearance to previous exam. No significant residual stenosis.  IMPRESSION: 1. Postoperative changes from prior posterior fusion from T11 through the sacrum, stable from previous without complication. Similar postoperative changes within the posterior paraspinous soft tissues without visible collection by CT. 2. 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 3. Aortic Atherosclerosis (ICD10-I70.0).  Electronically Signed   By: Jeannine Boga M.D.   On: 10/22/2020 01:09 Note: Reviewed        Physical Exam  General appearance: Well nourished, well developed, and well hydrated. In no apparent acute distress Mental status: Alert, oriented x 3 (person, place, & time)       Respiratory: No evidence of acute respiratory distress Eyes: PERLA Vitals: BP 128/80    Pulse 64    Temp (!) 97 F (36.1 C)    Ht '5\' 6"'  (1.676 m)    Wt 170 lb (77.1 kg)    SpO2 96%    BMI 27.44 kg/m  BMI: Estimated body mass index is 27.44 kg/m as calculated from the following:   Height as of this encounter: '5\' 6"'  (1.676 m).   Weight as of this encounter: 170 lb (77.1 kg). Ideal: Ideal body weight: 63.8 kg (140 lb 10.5 oz) Adjusted ideal body weight: 69.1 kg (152 lb 6.3 oz)  Assessment   Status Diagnosis  Controlled Controlled Controlled 1. Chronic low back pain (1ry area of Pain) (Bilateral) (L>R) w/o sciatica   2. Chronic shoulder pain (2ry area of Pain) (Bilateral) (L>R)   3. Chronic foot pain (3ry area of Pain) (Bilateral) (L>R)   4. Chronic hand pain (4th area of Pain) (Bilateral) (R>L)   5. Failed back surgical syndrome 4   6. Chronic pain syndrome   7. Pharmacologic therapy   8. Chronic use of opiate for therapeutic purpose   9. 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  Howard Murphy has a current  medication list which includes the following long-term medication(s): atorvastatin, [START ON 06/02/2021] oxycodone-acetaminophen, [START ON 07/02/2021] oxycodone-acetaminophen, [START ON 08/01/2021] oxycodone-acetaminophen, zolpidem, and omeprazole.  Pharmacotherapy (Medications Ordered): Meds ordered this encounter  Medications   oxyCODONE-acetaminophen (PERCOCET) 10-325 MG tablet    Sig: Take 1 tablet by mouth every 8 (eight) hours as needed for pain. Must last 30 days.    Dispense:  90 tablet    Refill:  0    DO NOT: delete (not duplicate); no partial-fill (will deny script to complete), no refill request (F/U required). DISPENSE: 1 day early if closed on fill date. WARN: No CNS-depressants within 8 hrs of med.   oxyCODONE-acetaminophen (PERCOCET) 10-325 MG tablet    Sig: Take 1 tablet by mouth every 8 (eight) hours as needed for pain. Must last 30 days.    Dispense:  90 tablet    Refill:  0    DO NOT: delete (not duplicate); no partial-fill (will deny script to complete), no refill request (F/U required). DISPENSE: 1 day early if closed on fill date. WARN: No CNS-depressants within 8 hrs of med.   oxyCODONE-acetaminophen (PERCOCET) 10-325 MG tablet    Sig: Take 1 tablet by mouth every 8 (eight) hours as needed for pain. Must last 30 days.    Dispense:  90 tablet    Refill:  0    DO NOT: delete (not duplicate); no partial-fill (will deny script to complete), no refill request (F/U required). DISPENSE: 1 day early if closed on fill date. WARN: No CNS-depressants within 8 hrs of med.   Orders:  No orders of the defined types were placed in this encounter.  Follow-up plan:   Return in about 3 months (around  08/31/2021) for Eval-day (M,W), (F2F), (MM).     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   Considering:   Diagnostic bilateral cervical facet block  Possible bilateral cervical facet RFA  Diagnostic right greater occipital NB  Possible right GON RFA  Possible right GON peripheral nerve stimulator trial  Diagnostic right CESI  Diagnostic bilateral IA shoulder injection  Diagnostic bilateral suprascapular NB  Possible bilateral suprascapular nerve RFA  Diagnostic bilateral SI joint block  Possible bilateral SI joint RFA  Diagnostic bilateral lumbar facet block  Possible bilateral lumbar facet RFA  Diagnostic left L4 TFESI  Diagnostic left L4-5 LESI  Diagnostic left IA hip joint injection  Possible left hip joint RFA  Diagnostic bilateral median nerve block (carpal tunnel block)  Diagnostic right CPB    Palliative PRN treatment(s):   Palliative/Diagnostic left SI Block (Area of L5 Transverse process spatulation) #2  Palliative IM Toradol/Norflex 60/60 (PRN)     Recent Visits Date Type Provider Dept  03/01/21 Office Visit Milinda Pointer, MD [facility]  Showing recent visits within past 90 days and meeting all other requirements Today's Visits Date Type Provider Dept  05/29/21 Office Visit Milinda Pointer, MD [facility]  Showing today's visits and meeting all other requirements Future Appointments No visits were found meeting these conditions. Showing future appointments within next 90 days and meeting all other requirements  I discussed the assessment and treatment plan with the patient. The patient was provided an opportunity to ask questions and all were answered. The patient agreed with the plan and demonstrated an understanding of the instructions.  Patient  advised to call back or seek an in-person  evaluation if the symptoms or condition worsens.  Duration of encounter: 30 minutes.  Note by: Gaspar Cola, MD Date: 05/29/2021; Time: 9:42 AM

## 2021-05-29 ENCOUNTER — Other Ambulatory Visit: Payer: Self-pay

## 2021-05-29 ENCOUNTER — Encounter: Payer: Self-pay | Admitting: Pain Medicine

## 2021-05-29 ENCOUNTER — Ambulatory Visit: Payer: Medicare HMO | Attending: Pain Medicine | Admitting: Pain Medicine

## 2021-05-29 VITALS — BP 128/80 | HR 64 | Temp 97.0°F | Ht 66.0 in | Wt 170.0 lb

## 2021-05-29 DIAGNOSIS — M79641 Pain in right hand: Secondary | ICD-10-CM | POA: Diagnosis not present

## 2021-05-29 DIAGNOSIS — Z79899 Other long term (current) drug therapy: Secondary | ICD-10-CM

## 2021-05-29 DIAGNOSIS — Q7649 Other congenital malformations of spine, not associated with scoliosis: Secondary | ICD-10-CM | POA: Insufficient documentation

## 2021-05-29 DIAGNOSIS — M79642 Pain in left hand: Secondary | ICD-10-CM | POA: Insufficient documentation

## 2021-05-29 DIAGNOSIS — Z79891 Long term (current) use of opiate analgesic: Secondary | ICD-10-CM | POA: Insufficient documentation

## 2021-05-29 DIAGNOSIS — M25511 Pain in right shoulder: Secondary | ICD-10-CM

## 2021-05-29 DIAGNOSIS — M961 Postlaminectomy syndrome, not elsewhere classified: Secondary | ICD-10-CM | POA: Diagnosis present

## 2021-05-29 DIAGNOSIS — M25512 Pain in left shoulder: Secondary | ICD-10-CM | POA: Insufficient documentation

## 2021-05-29 DIAGNOSIS — M545 Low back pain, unspecified: Secondary | ICD-10-CM

## 2021-05-29 DIAGNOSIS — G5793 Unspecified mononeuropathy of bilateral lower limbs: Secondary | ICD-10-CM

## 2021-05-29 DIAGNOSIS — G894 Chronic pain syndrome: Secondary | ICD-10-CM | POA: Insufficient documentation

## 2021-05-29 DIAGNOSIS — G8929 Other chronic pain: Secondary | ICD-10-CM | POA: Insufficient documentation

## 2021-05-29 MED ORDER — OXYCODONE-ACETAMINOPHEN 10-325 MG PO TABS: 1.0000 | ORAL_TABLET | Freq: Three times a day (TID) | ORAL | 0 refills | Status: DC | PRN

## 2021-05-29 MED ORDER — OXYCODONE-ACETAMINOPHEN 10-325 MG PO TABS: 1.0000 | ORAL_TABLET | Freq: Three times a day (TID) | ORAL | 0 refills | Status: AC | PRN

## 2021-05-29 MED ORDER — OXYCODONE-ACETAMINOPHEN 10-325 MG PO TABS
1.0000 | ORAL_TABLET | Freq: Three times a day (TID) | ORAL | 0 refills | Status: DC | PRN
Start: 2021-07-02 — End: 2021-08-12

## 2021-05-29 NOTE — Progress Notes (Signed)
Nursing Pain Medication Assessment:  Safety precautions to be maintained throughout the outpatient stay will include: orient to surroundings, keep bed in low position, maintain call bell within reach at all times, provide assistance with transfer out of bed and ambulation.  Medication Inspection Compliance: Pill count conducted under aseptic conditions, in front of the patient. Neither the pills nor the bottle was removed from the patient's sight at any time. Once count was completed pills were immediately returned to the patient in their original bottle.  Medication: Oxycodone/APAP Pill/Patch Count:  11 of 90 pills remain Pill/Patch Appearance: Markings consistent with prescribed medication Bottle Appearance: Standard pharmacy container. Clearly labeled. Filled Date: 4 / 23 / 2022 Last Medication intake:  TodaySafety precautions to be maintained throughout the outpatient stay will include: orient to surroundings, keep bed in low position, maintain call bell within reach at all times, provide assistance with transfer out of bed and ambulation.

## 2021-05-29 NOTE — Patient Instructions (Signed)
____________________________________________________________________________________________ ° °Medication Rules ° °Purpose: To inform patients, and their family members, of our rules and regulations. ° °Applies to: All patients receiving prescriptions (written or electronic). ° °Pharmacy of record: Pharmacy where electronic prescriptions will be sent. If written prescriptions are taken to a different pharmacy, please inform the nursing staff. The pharmacy listed in the electronic medical record should be the one where you would like electronic prescriptions to be sent. ° °Electronic prescriptions: In compliance with the [place] Strengthen Opioid Misuse Prevention (STOP) Act of 2017 (Session Law 2017-74/H243), effective June 11, 2018, all controlled substances must be electronically prescribed. Calling prescriptions to the pharmacy will cease to exist. ° °Prescription refills: Only during scheduled appointments. Applies to all prescriptions. ° °NOTE: The following applies primarily to controlled substances (Opioid* Pain Medications).  ° °Type of encounter (visit): For patients receiving controlled substances, face-to-face visits are required. (Not an option or up to the patient.) ° °Patient's responsibilities: °Pain Pills: Bring all pain pills to every appointment (except for procedure appointments). °Pill Bottles: Bring pills in original pharmacy bottle. Always bring the newest bottle. Bring bottle, even if empty. °Medication refills: You are responsible for knowing and keeping track of what medications you take and those you need refilled. °The day before your appointment: write a list of all prescriptions that need to be refilled. °The day of the appointment: give the list to the admitting nurse. Prescriptions will be written only during appointments. No prescriptions will be written on procedure days. °If you forget a medication: it will not be "Called in", "Faxed", or "electronically sent". You will  need to get another appointment to get these prescribed. °No early refills. Do not call asking to have your prescription filled early. °Prescription Accuracy: You are responsible for carefully inspecting your prescriptions before leaving our office. Have the discharge nurse carefully go over each prescription with you, before taking them home. Make sure that your name is accurately spelled, that your address is correct. Check the name and dose of your medication to make sure it is accurate. Check the number of pills, and the written instructions to make sure they are clear and accurate. Make sure that you are given enough medication to last until your next medication refill appointment. °Taking Medication: Take medication as prescribed. When it comes to controlled substances, taking less pills or less frequently than prescribed is permitted and encouraged. °Never take more pills than instructed. °Never take medication more frequently than prescribed.  °Inform other Doctors: Always inform, all of your healthcare providers, of all the medications you take. °Pain Medication from other Providers: You are not allowed to accept any additional pain medication from any other Doctor or Healthcare provider. There are two exceptions to this rule. (see below) In the event that you require additional pain medication, you are responsible for notifying us, as stated below. °Cough Medicine: Often these contain an opioid, such as codeine or hydrocodone. Never accept or take cough medicine containing these opioids if you are already taking an opioid* medication. The combination may cause respiratory failure and death. °Medication Agreement: You are responsible for carefully reading and following our Medication Agreement. This must be signed before receiving any prescriptions from our practice. Safely store a copy of your signed Agreement. Violations to the Agreement will result in no further prescriptions. (Additional copies of our  Medication Agreement are available upon request.) °Laws, Rules, & Regulations: All patients are expected to follow all Federal and State Laws, Statutes, Rules, & Regulations. Ignorance of   the Laws does not constitute a valid excuse.  °Illegal drugs and Controlled Substances: The use of illegal substances (including, but not limited to marijuana and its derivatives) and/or the illegal use of any controlled substances is strictly prohibited. Violation of this rule may result in the immediate and permanent discontinuation of any and all prescriptions being written by our practice. The use of any illegal substances is prohibited. °Adopted CDC guidelines & recommendations: Target dosing levels will be at or below 60 MME/day. Use of benzodiazepines** is not recommended. ° °Exceptions: There are only two exceptions to the rule of not receiving pain medications from other Healthcare Providers. °Exception #1 (Emergencies): In the event of an emergency (i.e.: accident requiring emergency care), you are allowed to receive additional pain medication. However, you are responsible for: As soon as you are able, call our office [phone], at any time of the day or night, and leave a message stating your name, the date and nature of the emergency, and the name and dose of the medication prescribed. In the event that your call is answered by a member of our staff, make sure to document and save the date, time, and the name of the person that took your information.  °Exception #2 (Planned Surgery): In the event that you are scheduled by another doctor or dentist to have any type of surgery or procedure, you are allowed (for a period no longer than 30 days), to receive additional pain medication, for the acute post-op pain. However, in this case, you are responsible for picking up a copy of our "Post-op Pain Management for Surgeons" handout, and giving it to your surgeon or dentist. This document is available at our office, and  does not require an appointment to obtain it. Simply go to our office during business hours (Monday-Thursday from 8:00 AM to 4:00 PM) (Friday 8:00 AM to 12:00 Noon) or if you have a scheduled appointment with us, prior to your surgery, and ask for it by name. In addition, you are responsible for: calling our office [phone], at any time of the day or night, and leaving a message stating your name, name of your surgeon, type of surgery, and date of procedure or surgery. Failure to comply with your responsibilities may result in termination of therapy involving the controlled substances. °Medication Agreement Violation. Following the above rules, including your responsibilities will help you in avoiding a Medication Agreement Violation (“Breaking your Pain Medication Contract”). ° °*Opioid medications include: morphine, codeine, oxycodone, oxymorphone, hydrocodone, hydromorphone, meperidine, tramadol, tapentadol, buprenorphine, fentanyl, methadone. °**Benzodiazepine medications include: diazepam (Valium), alprazolam (Xanax), clonazepam (Klonopine), lorazepam (Ativan), clorazepate (Tranxene), chlordiazepoxide (Librium), estazolam (Prosom), oxazepam (Serax), temazepam (Restoril), triazolam (Halcion) °(Last updated: 03/08/2021) °____________________________________________________________________________________________ ° ____________________________________________________________________________________________ ° °Medication Recommendations and Reminders ° °Applies to: All patients receiving prescriptions (written and/or electronic). ° °Medication Rules & Regulations: These rules and regulations exist for your safety and that of others. They are not flexible and neither are we. Dismissing or ignoring them will be considered "non-compliance" with medication therapy, resulting in complete and irreversible termination of such therapy. (See document titled "Medication Rules" for more details.) In all conscience,  because of safety reasons, we cannot continue providing a therapy where the patient does not follow instructions. ° °Pharmacy of record:  °Definition: This is the pharmacy where your electronic prescriptions will be sent.  °We do not endorse any particular pharmacy, however, we have experienced problems with [facility] not securing enough medication supply for the community. °We do not restrict you   in your choice of pharmacy. However, once we write for your prescriptions, we will NOT be re-sending more prescriptions to fix restricted supply problems created by your pharmacy, or your insurance.  °The pharmacy listed in the electronic medical record should be the one where you want electronic prescriptions to be sent. °If you choose to change pharmacy, simply notify our nursing staff. ° °Recommendations: °Keep all of your pain medications in a safe place, under lock and key, even if you live alone. We will NOT replace lost, stolen, or damaged medication. °After you fill your prescription, take 1 week's worth of pills and put them away in a safe place. You should keep a separate, properly labeled bottle for this purpose. The remainder should be kept in the original bottle. Use this as your primary supply, until it runs out. Once it's gone, then you know that you have 1 week's worth of medicine, and it is time to come in for a prescription refill. If you do this correctly, it is unlikely that you will ever run out of medicine. °To make sure that the above recommendation works, it is very important that you make sure your medication refill appointments are scheduled at least 1 week before you run out of medicine. To do this in an effective manner, make sure that you do not leave the office without scheduling your next medication management appointment. Always ask the nursing staff to show you in your prescription , when your medication will be running out. Then arrange for the receptionist to get you a return appointment,  at least 7 days before you run out of medicine. Do not wait until you have 1 or 2 pills left, to come in. This is very poor planning and does not take into consideration that we may need to cancel appointments due to bad weather, sickness, or emergencies affecting our staff. °DO NOT ACCEPT A "Partial Fill": If for any reason your pharmacy does not have enough pills/tablets to completely fill or refill your prescription, do not allow for a "partial fill". The law allows the pharmacy to complete that prescription within 72 hours, without requiring a new prescription. If they do not fill the rest of your prescription within those 72 hours, you will need a separate prescription to fill the remaining amount, which we will NOT provide. If the reason for the partial fill is your insurance, you will need to talk to the pharmacist about payment alternatives for the remaining tablets, but again, DO NOT ACCEPT A PARTIAL FILL, unless you can trust your pharmacist to obtain the remainder of the pills within 72 hours. ° °Prescription refills and/or changes in medication(s):  °Prescription refills, and/or changes in dose or medication, will be conducted only during scheduled medication management appointments. (Applies to both, written and electronic prescriptions.) °No refills on procedure days. No medication will be changed or started on procedure days. No changes, adjustments, and/or refills will be conducted on a procedure day. Doing so will interfere with the diagnostic portion of the procedure. °No phone refills. No medications will be "called into the pharmacy". °No Fax refills. °No weekend refills. °No Holliday refills. °No after hours refills. ° °Remember:  °Business hours are:  °Monday to Thursday 8:00 AM to 4:00 PM °Provider's Schedule: °Saki Legore, MD - Appointments are:  °Medication management: Monday and Wednesday 8:00 AM to 4:00 PM °Procedure day: Tuesday and Thursday 7:30 AM to 4:00 PM °Bilal Lateef, MD -  Appointments are:  °Medication management: Tuesday and Thursday 8:00   AM to 4:00 PM Procedure day: Monday and Wednesday 7:30 AM to 4:00 PM (Last update: 12/30/2019) ____________________________________________________________________________________________

## 2021-07-25 ENCOUNTER — Other Ambulatory Visit: Payer: Self-pay

## 2021-07-25 ENCOUNTER — Emergency Department: Payer: Medicare HMO

## 2021-07-25 ENCOUNTER — Emergency Department
Admission: EM | Admit: 2021-07-25 | Discharge: 2021-07-25 | Disposition: A | Discharge status: Home / Self Care | Payer: Medicare HMO | Attending: Emergency Medicine | Admitting: Emergency Medicine

## 2021-07-25 DIAGNOSIS — M549 Dorsalgia, unspecified: Secondary | ICD-10-CM | POA: Diagnosis present

## 2021-07-25 DIAGNOSIS — M5416 Radiculopathy, lumbar region: Secondary | ICD-10-CM | POA: Diagnosis not present

## 2021-07-25 DIAGNOSIS — E119 Type 2 diabetes mellitus without complications: Secondary | ICD-10-CM | POA: Insufficient documentation

## 2021-07-25 DIAGNOSIS — W010XXA Fall on same level from slipping, tripping and stumbling without subsequent striking against object, initial encounter: Secondary | ICD-10-CM | POA: Diagnosis not present

## 2021-07-25 MED ORDER — KETOROLAC TROMETHAMINE 60 MG/2ML IM SOLN
30.0000 mg | Freq: Once | INTRAMUSCULAR | Status: AC
  Administered 2021-07-25: 30 mg via INTRAMUSCULAR
  Filled 2021-07-25: qty 2

## 2021-07-25 MED ORDER — OXYCODONE HCL 5 MG PO TABS
10.0000 mg | ORAL_TABLET | Freq: Once | ORAL | Status: AC
  Administered 2021-07-25: 10 mg via ORAL
  Filled 2021-07-25: qty 2

## 2021-07-25 NOTE — ED Triage Notes (Signed)
Pt states he fell out of the back of his pick up truck yesterday landing on his back, pt c/o lower back pain with some tingling down the back of the right leg, hx of  back surgery

## 2021-07-25 NOTE — ED Provider Triage Note (Signed)
Emergency Medicine Provider Triage Evaluation Note  MARLYN RABINE, a 66 y.o. male  was evaluated in triage.  Pt complains of acute low back pain.  Patient reports mechanical fall off the back of his truck yesterday, where he landed on his back.  Denies any head injury or LOC.  He presents with acute low back pain with some referral down the right buttocks.  He denies any bladder or bowel incontinence, foot drop, or saddle anesthesia.  Review of Systems  Positive: Back pain Negative: LOC  Physical Exam  There were no vitals taken for this visit. Gen:   Awake, no distress   Resp:  Normal effort  MSK:   Moves extremities without difficulty  Other:  NEURO: LE DTRs 2+  Medical Decision Making  Medically screening exam initiated at 12:59 PM.  Appropriate orders placed.  [name] was informed that the remainder of the evaluation will be completed by another provider, this initial triage assessment does not replace that evaluation, and the importance of remaining in the ED until their evaluation is complete.  Patient to the ED for evaluation of acute low back pain after mechanical fall yesterday.  Patient with a history of extensive back surgery presents noting some back pain and RLE radiculopathy.   Melvenia Needles, PA-C 07/25/21 1318

## 2021-07-25 NOTE — ED Provider Notes (Signed)
[facility] Provider Note    Event Date/Time   First MD Initiated Contact with Patient 07/25/21 1406     (approximate)   History   Fall   HPI  Howard Murphy is a 66 y.o. male with history of diabetes, chronic back pain and as listed in EMR presents to the emergency department for treatment and evaluation of back pain that radiates into his right leg after he fell yesterday. While trying to get in the back of his truck, his hand slipped and he landed on his back. Back surgery about 1 1/2 years ago.      Physical Exam   Triage Vital Signs: ED Triage Vitals [07/25/21 1302]  Enc Vitals Group     BP 126/81     Pulse Rate 84     Resp 17     Temp 98.1 F (36.7 C)     Temp Source Oral     SpO2 96 %     Weight 185 lb (83.9 kg)     Height 5\' 7"  (1.702 m)     Head Circumference      Peak Flow      Pain Score 10     Pain Loc      Pain Edu?      Excl. in [place]?     Most recent vital signs: Vitals:   07/25/21 1302  BP: 126/81  Pulse: 84  Resp: 17  Temp: 98.1 F (36.7 C)  SpO2: 96%    General: Awake, no distress.  CV:  Good peripheral perfusion.  Resp:  Normal effort.  Abd:  No distention.  Other:  Neuro exam unremarkable   ED Results / Procedures / Treatments   Labs (all labs ordered are listed, but only abnormal results are displayed) Labs Reviewed - No data to display   EKG     RADIOLOGY  Image and radiology report reviewed by me.  Image of the lumbar spine is negative for acute concerns.  PROCEDURES:  Critical Care performed: No  Procedures   MEDICATIONS ORDERED IN ED: Medications  oxyCODONE (Oxy IR/ROXICODONE) immediate release tablet 10 mg (10 mg Oral Given 07/25/21 1435)  ketorolac (TORADOL) injection 30 mg (30 mg Intramuscular Given 07/25/21 1435)     IMPRESSION / MDM / ASSESSMENT AND PLAN / ED COURSE   I have reviewed the triage note.  Differential diagnosis includes, but is not limited to, acute on  chronic pain, compression fracture, disruption of lumbar fusion.  66 year old male presenting to the emergency department for evaluation after a fall yesterday.  See HPI for further details.  Exam is overall reassuring.  X-ray is negative for acute abnormalities.  Plan will be to give him Roxicodone and IM Toradol.  Patient states that he is under a pain management contract therefore no additional controlled substances will be prescribed today.  Patient was also offered prednisone for the radicular symptoms, however he states that it never worked prior to his surgery and does not really want to take it.  He was also offered muscle relaxers but states that they do not really work either.  He has an appointment with his pain management provider next week.  Patient was advised to follow-up with his primary care or neurologist if pain does not improve over the next week or so.  He was encouraged to return to the emergency department for symptoms of change or worsen if he is unable to be seen.  FINAL CLINICAL IMPRESSION(S) / ED DIAGNOSES   Final diagnoses:  Lumbar radiculopathy, acute     Rx / DC Orders   ED Discharge Orders     None        Note:  This document was prepared using Dragon voice recognition software and may include unintentional dictation errors.   Victorino Dike, FNP 07/25/21 1521    Naaman Plummer, MD 07/25/21 1622

## 2021-07-25 NOTE — ED Notes (Signed)
See triage note  presents with back pain  states fell from truck yetserday

## 2021-07-25 NOTE — Discharge Instructions (Signed)
Please follow-up with primary care or your neurosurgeon if back pain continues for more than a week or sooner if symptoms change or worsen.  Return to the emergency department for symptoms of concern if unable to schedule an appointment.

## 2021-08-12 NOTE — Progress Notes (Deleted)
No show to appointment.

## 2021-08-14 ENCOUNTER — Encounter: Payer: Medicare HMO | Admitting: Pain Medicine

## 2021-08-14 DIAGNOSIS — G894 Chronic pain syndrome: Secondary | ICD-10-CM

## 2021-08-14 DIAGNOSIS — M961 Postlaminectomy syndrome, not elsewhere classified: Secondary | ICD-10-CM

## 2021-08-14 DIAGNOSIS — Z79891 Long term (current) use of opiate analgesic: Secondary | ICD-10-CM

## 2021-08-14 DIAGNOSIS — G5793 Unspecified mononeuropathy of bilateral lower limbs: Secondary | ICD-10-CM

## 2021-08-14 DIAGNOSIS — G8929 Other chronic pain: Secondary | ICD-10-CM

## 2021-08-14 DIAGNOSIS — Z79899 Other long term (current) drug therapy: Secondary | ICD-10-CM

## 2021-08-14 DIAGNOSIS — M79641 Pain in right hand: Secondary | ICD-10-CM

## 2021-08-28 ENCOUNTER — Encounter: Payer: Medicare HMO | Admitting: Pain Medicine

## 2021-09-02 ENCOUNTER — Emergency Department
Admission: EM | Admit: 2021-09-02 | Discharge: 2021-09-02 | Disposition: A | Discharge status: Home / Self Care | Payer: Medicare HMO | Attending: Emergency Medicine | Admitting: Emergency Medicine

## 2021-09-02 ENCOUNTER — Other Ambulatory Visit: Payer: Self-pay

## 2021-09-02 ENCOUNTER — Encounter: Payer: Self-pay | Admitting: Emergency Medicine

## 2021-09-02 DIAGNOSIS — I10 Essential (primary) hypertension: Secondary | ICD-10-CM | POA: Insufficient documentation

## 2021-09-02 DIAGNOSIS — M545 Low back pain, unspecified: Secondary | ICD-10-CM | POA: Insufficient documentation

## 2021-09-02 DIAGNOSIS — M549 Dorsalgia, unspecified: Secondary | ICD-10-CM

## 2021-09-02 DIAGNOSIS — R109 Unspecified abdominal pain: Secondary | ICD-10-CM | POA: Diagnosis not present

## 2021-09-02 DIAGNOSIS — E1140 Type 2 diabetes mellitus with diabetic neuropathy, unspecified: Secondary | ICD-10-CM | POA: Diagnosis not present

## 2021-09-02 LAB — URINALYSIS, ROUTINE W REFLEX MICROSCOPIC
Bacteria, UA: NONE SEEN
Bilirubin Urine: NEGATIVE
Glucose, UA: >=500 mg/dL — AB
Hgb urine dipstick: NEGATIVE
Ketones, ur: NEGATIVE mg/dL
Leukocytes,Ua: NEGATIVE
Nitrite: NEGATIVE
Protein, ur: NEGATIVE mg/dL
Specific Gravity, Urine: 1.031 — ABNORMAL HIGH (ref 1.005–1.030)
pH: 6 (ref 5.0–8.0)

## 2021-09-02 LAB — CBC
HCT: 52.7 % — ABNORMAL HIGH (ref 39.0–52.0)
Hemoglobin: 17 g/dL (ref 13.0–17.0)
MCH: 27.9 pg (ref 26.0–34.0)
MCHC: 32.3 g/dL (ref 30.0–36.0)
MCV: 86.4 fL (ref 80.0–100.0)
Platelets: 238 10*3/uL (ref 150–400)
RBC: 6.1 MIL/uL — ABNORMAL HIGH (ref 4.22–5.81)
RDW: 14.8 % (ref 11.5–15.5)
WBC: 7.5 10*3/uL (ref 4.0–10.5)
nRBC: 0 % (ref 0.0–0.2)

## 2021-09-02 LAB — BASIC METABOLIC PANEL
Anion gap: 9 (ref 5–15)
BUN: 20 mg/dL (ref 8–23)
CO2: 27 mmol/L (ref 22–32)
Calcium: 9.4 mg/dL (ref 8.9–10.3)
Chloride: 101 mmol/L (ref 98–111)
Creatinine, Ser: 0.9 mg/dL (ref 0.61–1.24)
GFR, Estimated: >60 mL/min (ref >60)
Glucose, Bld: 151 mg/dL — ABNORMAL HIGH (ref 70–99)
Potassium: 3.3 mmol/L — ABNORMAL LOW (ref 3.5–5.1)
Sodium: 137 mmol/L (ref 135–145)

## 2021-09-02 MED ORDER — ETODOLAC 400 MG PO TABS: 400.0000 mg | ORAL_TABLET | Freq: Two times a day (BID) | ORAL | 0 refills | Status: AC

## 2021-09-02 MED ORDER — HYDROCODONE-ACETAMINOPHEN 5-325 MG PO TABS
1.0000 | ORAL_TABLET | Freq: Once | ORAL | Status: AC
  Administered 2021-09-02: 1 via ORAL
  Filled 2021-09-02: qty 1

## 2021-09-02 MED ORDER — HYDROCODONE-ACETAMINOPHEN 5-325 MG PO TABS: 1.0000 | ORAL_TABLET | Freq: Four times a day (QID) | ORAL | 0 refills | Status: AC | PRN

## 2021-09-02 MED ORDER — METHOCARBAMOL 500 MG PO TABS
1000.0000 mg | ORAL_TABLET | Freq: Once | ORAL | Status: AC
  Administered 2021-09-02: 1000 mg via ORAL
  Filled 2021-09-02: qty 2

## 2021-09-02 MED ORDER — METHOCARBAMOL 500 MG PO TABS: ORAL_TABLET | ORAL | 0 refills | Status: AC

## 2021-09-02 MED ORDER — KETOROLAC TROMETHAMINE 30 MG/ML IJ SOLN
30.0000 mg | Freq: Once | INTRAMUSCULAR | Status: AC
  Administered 2021-09-02: 30 mg via INTRAMUSCULAR
  Filled 2021-09-02: qty 1

## 2021-09-02 NOTE — Discharge Instructions (Addendum)
Follow-up with your primary care provider if any continued problems.  You may continue to use warm moist heat or use ice packs to your back as needed for discomfort.  3 prescriptions were sent to the pharmacy.  Do not take the muscle relaxant and pain medication together and plan on driving or operating machinery as it could cause drowsiness and increase your risk for injury.  The etodolac is 400 mg twice daily with food and should help with pain and inflammation.  If any severe worsening of your symptoms such as incontinence of bowel or bladder return to the emergency department immediately. ?

## 2021-09-02 NOTE — ED Provider Notes (Signed)
? ?[facility] ?Provider Note ? ? ? Event Date/Time  ? First MD Initiated Contact with Patient 09/02/21 1041   ?  (approximate) ? ? ?History  ? ?Flank Pain ? ? ?HPI ? ?Howard Murphy is a 66 y.o. male   presents to the ED with complaint of right flank pain for couple days without history of injury.  Patient denies any urinary symptoms or recent event that would have caused an injury to his back.  Patient does have a history of kidney stones and also has had chronic back pain due to surgery in the lumbar area.  Patient denies any incontinence of bowel or bladder and continues to ambulate without any assistance.  Patient has a history of chronic hip pain, lumbar facet joint syndrome, diabetic peripheral neuropathy, chronic lumbar radicular pain bilaterally, diabetes, lumbar surgery, degenerative disc disease lumbar, GERD, hypertension, chronic muscle skeletal pain.  Currently rates his pain as a 10/10. ? ?  ? ? ?Physical Exam  ? ?Triage Vital Signs: ?ED Triage Vitals  ?Enc Vitals Group  ?   BP 09/02/21 1032 114/83  ?   Pulse Rate 09/02/21 1032 80  ?   Resp 09/02/21 1032 18  ?   Temp 09/02/21 1032 98.4 ?F (36.9 ?C)  ?   Temp src --   ?   SpO2 09/02/21 1032 96 %  ?   Weight 09/02/21 1032 180 lb (81.6 kg)  ?   Height 09/02/21 1032 '5\' 7"'$  (1.702 m)  ?   Head Circumference --   ?   Peak Flow --   ?   Pain Score 09/02/21 1032 10  ?   Pain Loc --   ?   Pain Edu? --   ?   Excl. in [place]? --   ? ? ?Most recent vital signs: ?Vitals:  ? 09/02/21 1032  ?BP: 114/83  ?Pulse: 80  ?Resp: 18  ?Temp: 98.4 ?F (36.9 ?C)  ?SpO2: 96%  ? ? ? ?General: Awake, no distress.  ?CV:  Good peripheral perfusion.  Heart regular rate and rhythm. ?Resp:  Normal effort.  Clear bilaterally. ?Abd:  No distention.  Soft, nontender bowel sounds normoactive x4 quadrants. ?Other:  Examination of the back there is well-healed surgical scars along the lumbar spine.  No soft tissue edema noted.  No CVA tenderness.  Range of motion is slow and  guarded secondary to increased pain and patient had some mild difficulty getting from a supine position to sitting.  There is tenderness on palpation of the right lumbar paravertebral muscles.  Straight leg raises are negative, good muscle strength lower extremities bilaterally at 5/5.  Patient is able to bear weight and ambulate without any assistance. ? ? ?ED Results / Procedures / Treatments  ? ?Labs ?(all labs ordered are listed, but only abnormal results are displayed) ?Labs Reviewed  ?URINALYSIS, ROUTINE W REFLEX MICROSCOPIC - Abnormal; Notable for the following components:  ?    Result Value  ? Color, Urine YELLOW (*)   ? APPearance CLEAR (*)   ? Specific Gravity, Urine 1.031 (*)   ? Glucose, UA >=500 (*)   ? All other components within normal limits  ?BASIC METABOLIC PANEL - Abnormal; Notable for the following components:  ? Potassium 3.3 (*)   ? Glucose, Bld 151 (*)   ? All other components within normal limits  ?CBC - Abnormal; Notable for the following components:  ? RBC 6.10 (*)   ? HCT 52.7 (*)   ?  All other components within normal limits  ? ? ? ? ? ?PROCEDURES: ? ?Critical Care performed:  ? ?Procedures ? ? ?MEDICATIONS ORDERED IN ED: ?Medications  ?ketorolac (TORADOL) 30 MG/ML injection 30 mg (30 mg Intramuscular Given 09/02/21 1202)  ?methocarbamol (ROBAXIN) tablet 1,000 mg (1,000 mg Oral Given 09/02/21 1201)  ?HYDROcodone-acetaminophen (NORCO/VICODIN) 5-325 MG per tablet 1 tablet (1 tablet Oral Given 09/02/21 1201)  ? ? ? ?IMPRESSION / MDM / ASSESSMENT AND PLAN / ED COURSE  ?I reviewed the triage vital signs and the nursing notes. ? ? ?Differential diagnosis includes, but is not limited to, lumbar pain, lumbar strain, left flank pain due to urolithiasis. ? ?66 year old male presents to the ED with complaint of right flank pain without urinary symptoms or history of injury.  Patient has a history both of urolithiasis and chronic lumbar pain.  Urinalysis was clear.  Physical exam is more suspicious for  muscle skeletal pain and patient was given an injection of Toradol, hydrocodone and methocarbamol 1000 mg p.o. while in the emergency department.  A prescription for methocarbamol was sent to the pharmacy along with etodolac 400 mg and hydrocodone.  Patient is instructed to use ice or moist compresses to the area as needed and follow-up with his PCP if any continued problems or not improving. ? ? ?FINAL CLINICAL IMPRESSION(S) / ED DIAGNOSES  ? ?Final diagnoses:  ?Musculoskeletal back pain  ? ? ? ?Rx / DC Orders  ? ?ED Discharge Orders   ? ?      Ordered  ?  methocarbamol (ROBAXIN) 500 MG tablet       ? 09/02/21 1158  ?  HYDROcodone-acetaminophen (NORCO/VICODIN) 5-325 MG tablet  Every 6 hours PRN       ? 09/02/21 1158  ?  etodolac (LODINE) 400 MG tablet  2 times daily       ? 09/02/21 1158  ? ?  ?  ? ?  ? ? ? ?Note:  This document was prepared using Dragon voice recognition software and may include unintentional dictation errors. ?  ?Johnn Hai, PA-C ?09/02/21 1547 ? ?  ?Delman Kitten, MD ?09/02/21 1551 ? ?

## 2021-09-02 NOTE — ED Triage Notes (Signed)
Pt via POV from home. Pt c/o R flank pain for a couple of days. Denies any NVD. Denies any urinary symptoms. Pt has a hx of kidney stones. Pt is A&OX4 and NAD ?

## 2021-09-02 NOTE — ED Notes (Signed)
Patient declined discharge vital signs. 

## 2021-09-02 NOTE — ED Notes (Signed)
Patient states his ride is out in the parking lot. ?

## 2021-09-25 NOTE — Progress Notes (Incomplete)
09/25/21 ?7:58 PM  ? ?[name] ?23-Jul-1955 ?[id number] ? ?Referring provider:  ?[facility] ?[address] ?[place],  [place] [id number] ?No chief complaint on file. ? ? ?Urological history  ?Right renal pelvic stone with renal colic ?- S/p ureteroscopy in 08/2017 ?- CT renal stone study on 01/09/2020 visualized no renal or ureteral stones  ? ?HPI: ?Howard Murphy is a 66 y.o.male who presents today for further evaluation of prostate pain.  ? ? ? ? ? ?PMH: ?Past Medical History:  ?Diagnosis Date  ? Arthritis   ? Osteo vs rheumatoid ?  ? Back ache 05/05/2013  ? Diabetes mellitus without complication ([place])   ? GERD (gastroesophageal reflux disease)   ? Headache   ? MIGRAINES  ? History of kidney stones   ? Hyperlipemia   ? Hypertension   ? Kidney stones   ? Kidney stones   ? Nephrolithiasis   ? Shortness of breath dyspnea   ? ? ?Surgical History: ?Past Surgical History:  ?Procedure Laterality Date  ? APPENDECTOMY    ? BACK SURGERY  2015  ? x3  ? BACK SURGERY  08/09/2016  ? BACK SURGERY Bilateral 2019  ? CHOLECYSTECTOMY  2000  ? COLONOSCOPY WITH PROPOFOL N/A 04/11/2015  ? Procedure: COLONOSCOPY WITH PROPOFOL;  Surgeon: Manya Silvas, MD;  Location: [facility] ENDOSCOPY;  Service: Endoscopy;  Laterality: N/A;  ? CYSTOSCOPY WITH URETEROSCOPY AND STENT PLACEMENT Right 08/27/2017  ? Procedure: CYSTOSCOPY WITH URETEROSCOPY AND STENT PLACEMENT;  Surgeon: Abbie Sons, MD;  Location: [facility] ORS;  Service: Urology;  Laterality: Right;  ? ESOPHAGOGASTRODUODENOSCOPY (EGD) WITH PROPOFOL N/A 04/11/2015  ? Procedure: ESOPHAGOGASTRODUODENOSCOPY (EGD) WITH PROPOFOL;  Surgeon: Manya Silvas, MD;  Location: [facility] ENDOSCOPY;  Service: Endoscopy;  Laterality: N/A;  ? INCISION AND DRAINAGE N/A 10/22/2020  ? Procedure: INCISION AND DRAINAGE of POSTERIOR LUMBAR;  Surgeon: Meade Maw, MD;  Location: [facility] ORS;  Service: Neurosurgery;  Laterality: N/A;  ? LAPAROSCOPIC APPENDECTOMY N/A 12/18/2017  ? Procedure:  APPENDECTOMY LAPAROSCOPIC;  Surgeon: Florene Glen, MD;  Location: [facility] ORS;  Service: General;  Laterality: N/A;  ? POSTERIOR LUMBAR FUSION 4 LEVEL N/A 09/14/2020  ? Procedure: OPEN T10-PELVIS POSTERIOR SPINAL FUSION (HARDWARE ALREADY PRESENT AT L2-S2); L1-2 POSTERIOR SPINAL DECOMPRESSION;  Surgeon: Meade Maw, MD;  Location: [facility] ORS;  Service: Neurosurgery;  Laterality: N/A;  ? SHOULDER SURGERY Right 2016  ? X2  ? SHOULDER SURGERY Left 06/2015  ? ? ?Home Medications:  ?Allergies as of 09/28/2021   ? ?   Reactions  ? Adhesive [tape] Other (See Comments)  ? Honeycomb dressing, blisters  ? ?  ? ?  ?Medication List  ?  ? ?  ? Accurate as of September 25, 2021  7:58 PM. If you have any questions, ask your nurse or doctor.  ?  ?  ? ?  ? ?acetaminophen 500 MG tablet ?Commonly known as: TYLENOL ?Take 2 tablets (1,000 mg total) by mouth every 6 (six) hours. ?  ?atorvastatin 40 MG tablet ?Commonly known as: LIPITOR ?Take 1 tablet (40 mg total) by mouth at bedtime. ?  ?cyclobenzaprine 5 MG tablet ?Commonly known as: FLEXERIL ?Take by mouth. ?  ?etodolac 400 MG tablet ?Commonly known as: LODINE ?Take 1 tablet (400 mg total) by mouth 2 (two) times daily. ?  ?HYDROcodone-acetaminophen 5-325 MG tablet ?Commonly known as: NORCO/VICODIN ?Take 1 tablet by mouth every 6 (six) hours as needed for moderate pain. ?  ?Januvia 100 MG tablet ?Generic drug: sitaGLIPtin ?  Take 100 mg by mouth daily. ?  ?Jardiance 25 MG Tabs tablet ?Generic drug: empagliflozin ?Take 25 mg by mouth daily. ?  ?meclizine 25 MG tablet ?Commonly known as: ANTIVERT ?Take 25 mg by mouth every 6 (six) hours as needed for dizziness. ?  ?methocarbamol 500 MG tablet ?Commonly known as: Robaxin ?1-2 tablets every 6 hours prn muscles spasms ?  ?omeprazole 20 MG tablet ?Commonly known as: PriLOSEC OTC ?Take 1 tablet (20 mg total) by mouth daily for 20 days. ?  ?oxyCODONE-acetaminophen 10-325 MG tablet ?Commonly known as: Percocet ?Take 1 tablet by mouth every 8  (eight) hours as needed for pain. Must last 30 days. ?  ?senna-docusate 8.6-50 MG tablet ?Commonly known as: Senokot-S ?Take 1 tablet by mouth 2 (two) times daily. ?  ?tamsulosin 0.4 MG Caps capsule ?Commonly known as: FLOMAX ?Take 1 capsule (0.4 mg total) by mouth daily. ?  ?testosterone cypionate 200 MG/ML injection ?Commonly known as: DEPOTESTOSTERONE CYPIONATE ?Inject 200 mg into the muscle every 7 (seven) days. ?  ?UNABLE TO FIND ?Outpatient physical therapy ?  ?zolpidem 10 MG tablet ?Commonly known as: AMBIEN ?Take 10 mg by mouth at bedtime. ?  ? ?  ? ? ?Allergies:  ?Allergies  ?Allergen Reactions  ? Adhesive [Tape] Other (See Comments)  ?  Honeycomb dressing, blisters  ? ? ?Family History: ?Family History  ?Problem Relation Age of Onset  ? Cancer Mother   ?     Breast CA  ? Stroke Father   ? Heart disease Father   ? Cancer Sister   ? Cancer Maternal Grandmother   ? Cancer Sister   ? Bladder Cancer Neg Hx   ? Prostate cancer Neg Hx   ? Kidney cancer Neg Hx   ? ? ?Social History:  reports that he has never smoked. He quit smokeless tobacco use about 8 years ago.  His smokeless tobacco use included chew. He reports that he does not drink alcohol and does not use drugs. ? ? ?Physical Exam: ?There were no vitals taken for this visit.  ?Constitutional:  Alert and oriented, No acute distress. ?HEENT: [place] AT, moist mucus membranes.  Trachea midline, no masses. ?Cardiovascular: No clubbing, cyanosis, or edema. ?Respiratory: Normal respiratory effort, no increased work of breathing. ?Skin: No rashes, bruises or suspicious lesions. ?Neurologic: Grossly intact, no focal deficits, moving all 4 extremities. ?Psychiatric: Normal mood and affect. ? ?Laboratory Data: ? ?Lab Results  ?Component Value Date  ? CREATININE 0.90 09/02/2021  ? ?Lab Results  ?Component Value Date  ? HGBA1C 6.0 (H) 09/25/2020  ? ?Component ?    Latest Ref Rng 09/02/2021  ?Glucose ?    70 - 99 mg/dL 151 (H)   ?BUN ?    8 - 23 mg/dL 20   ?Creatinine ?     0.61 - 1.24 mg/dL 0.90   ?Sodium ?    135 - 145 mmol/L 137   ?Potassium ?    3.5 - 5.1 mmol/L 3.3 (L)   ?Chloride ?    98 - 111 mmol/L 101   ?CO2 ?    22 - 32 mmol/L 27   ?Calcium ?    8.9 - 10.3 mg/dL 9.4   ?Anion gap ?    5 - 15  9   ?GFR, Estimated ?    >60 mL/min >60   ?  ?(H) High ?(L) Low ?Component ?    Latest Ref Rng 09/02/2021  ?WBC ?    4.0 - 10.5 K/uL 7.5   ?  RBC ?    4.22 - 5.81 MIL/uL 6.10 (H)   ?Hemoglobin ?    13.0 - 17.0 g/dL 17.0   ?HCT ?    39.0 - 52.0 % 52.7 (H)   ?MCV ?    80.0 - 100.0 fL 86.4   ?MCH ?    26.0 - 34.0 pg 27.9   ?MCHC ?    30.0 - 36.0 g/dL 32.3   ?RDW ?    11.5 - 15.5 % 14.8   ?Platelets ?    150 - 400 K/uL 238   ?Neutrophils ?    %   ?NEUT# ?    1.7 - 7.7 K/uL   ?Lymphocytes ?    %   ?Lymphocyte # ?    0.7 - 4.0 K/uL   ?Monocytes Relative ?    %   ?Monocyte # ?    0.1 - 1.0 K/uL   ?Eosinophil ?    %   ?Eosinophils Absolute ?    0.0 - 0.5 K/uL   ?Basophil ?    %   ?Basophils Absolute ?    0.0 - 0.1 K/uL   ?Sodium ?    135 - 145 mmol/L   ?Potassium ?    3.5 - 5.1 mmol/L   ?Glucose ?    70 - 99 mg/dL   ?nRBC ?    0.0 - 0.2 % 0.0   ?Immature Granulocytes ?    %   ?Abs Immature Granulocytes ?    0.00 - 0.07 K/uL   ?  ?(H) High ? ? ?Urinalysis ? ? ?Pertinent Imaging: ? ? ?Assessment & Plan:   ? ? ?No follow-ups on file. ? ?[facility] ?[address] ?[place], [place] [address] ?[phone] ? ?I,Kailey Littlejohn,acting as a Education administrator for [employer], PA-C.,have documented all relevant documentation on the behalf of [place], PA-C,as directed by  [facility], PA-C while in the presence of SHANNON MCGOWAN, PA-C. ? ? ?

## 2021-09-28 ENCOUNTER — Encounter: Payer: Self-pay | Admitting: Urology

## 2021-09-28 ENCOUNTER — Ambulatory Visit: Payer: Medicare HMO | Admitting: Urology

## 2021-09-28 ENCOUNTER — Telehealth: Payer: Self-pay

## 2021-09-28 NOTE — Telephone Encounter (Signed)
Patient fiance penny gomez called regarding an appointment for patient. Pt was scheduled today. Pt no showed for appointment. Patient was offered an appointment with Dr. Bernardo Heater in which he declined and stated he will go somewhere else. ?

## 2021-12-28 ENCOUNTER — Emergency Department
Admission: EM | Admit: 2021-12-28 | Discharge: 2021-12-28 | Disposition: A | Discharge status: Home / Self Care | Payer: Medicare HMO | Attending: Emergency Medicine | Admitting: Emergency Medicine

## 2021-12-28 DIAGNOSIS — M5441 Lumbago with sciatica, right side: Secondary | ICD-10-CM | POA: Insufficient documentation

## 2021-12-28 DIAGNOSIS — M545 Low back pain, unspecified: Secondary | ICD-10-CM | POA: Diagnosis present

## 2021-12-28 MED ORDER — OXYCODONE-ACETAMINOPHEN 5-325 MG PO TABS
3.0000 | ORAL_TABLET | Freq: Once | ORAL | Status: AC
  Administered 2021-12-28: 3 via ORAL
  Filled 2021-12-28: qty 3

## 2021-12-28 NOTE — ED Triage Notes (Signed)
See paper chart for downtime 

## 2021-12-28 NOTE — ED Provider Notes (Addendum)
[facility] Provider Note    Event Date/Time   First MD Initiated Contact with Patient 12/28/21 0601     (approximate)   History   Back Pain   HPI  Howard Murphy is a 66 y.o. male with a past medical history that includes chronic back problems status post prior surgeries by Dr. Izora Ribas.  He presents for evaluation of about a month of ongoing lower back pain that radiates down the back of his right upper leg.  He has not followed up in clinic.  He said that he has always had some numbness at the top of his right buttocks "for years", but that has not gotten any worse.  He denies any urinary symptoms; he takes tamsulosin at baseline for some urinary hesitancy but that has not gotten any worse.  He is able to urinate and he has not had any urinary incontinence.  He has also not had any bowel retention or incontinence.  He is able to ambulate, just hurts to do so.  He has had no fever, chest pain, shortness of breath, nausea, and vomiting.  No recent trauma or injury of which she is aware.  He reports that what convinced him to come in to the emergency department tonight was that he noticed he has a bruise on the lower inner part of his right thigh.  He does not remember having any injury, so he does not know if it is related to his back pain.  He has no history of blood clots in the legs of the lungs.  No recent immobilizations or surgeries.  No edema of the leg.  The pain is no different than it has been and seems to radiate from his lower back.   Physical Exam   Triage Vital Signs: ED Triage Vitals [12/28/21 0115]  Enc Vitals Group     BP (!) 146/81     Pulse Rate 89     Resp 20     Temp 98.4 F (36.9 C)     Temp Source Oral     SpO2 94 %     Weight      Height      Head Circumference      Peak Flow      Pain Score      Pain Loc      Pain Edu?      Excl. in [place]?     Most recent vital signs: Vitals:   12/28/21 0607 12/28/21 0740  BP: 136/90  122/65  Pulse: 68 66  Resp: 17 18  Temp:    SpO2: 98% 96%     General: Awake, no distress.  CV:  Good peripheral perfusion. Normal cap refill. Resp:  Normal effort.  Abd:  No distention.  Other:  No palpable induration or reproducible tenderness to palpation along the venous distribution particularly in the popliteal fossa.  No peripheral edema and no visible size difference between right and left legs.  He has the ecchymosis shown in the photo below.  It is neither tender nor indurated.  It is not warm and appears most consistent with a mild trauma, but there is no large hematoma beneath it nor evidence of any active extravasation.  It is isolated as demonstrated in the photo below and does not extend all the way up from his proximal thigh.      ED Results / Procedures / Treatments   Labs (all labs ordered are listed, but only abnormal  results are displayed) Labs Reviewed - No data to display     MEDICATIONS ORDERED IN ED: Medications  oxyCODONE-acetaminophen (PERCOCET/ROXICET) 5-325 MG per tablet 3 tablet (3 tablets Oral Given 12/28/21 0740)     IMPRESSION / MDM / ASSESSMENT AND PLAN / ED COURSE  I reviewed the triage vital signs and the nursing notes.                              Differential diagnosis includes, but is not limited to, acute on chronic low back pain with sciatica, cauda equina syndrome, lumbar radiculopathy, DVT, intramuscular hematoma.  Patient's presentation is most consistent with exacerbation of chronic illness.  The patient's vital signs are stable and there is no sign of systemic illness.  He has been having the symptoms in his back and radiating down his right leg for about a month.  He came in because of the presence of a bruise on his right lower inner thigh, but after careful consideration and examination, I do not think that the 2 things are related.  I think he most likely bumped his leg on something and caused a traumatic bruise, but there is  no large hematoma.  He has not been having any symptoms of possible acute blood loss such as lightheadedness or near syncope.  He has no induration or evidence of infection.  He also has no warning signs or symptoms of a neurosurgical emergency like cauda equina syndrome.  I talked it over with him and we decided outpatient follow-up is appropriate.  I sent a message through [facility] to Dr. Izora Ribas to make him aware of the patient's visit.  No role for imaging in the emergency department tonight.  I suggested that if the patient has not heard from Dr. Rhea Bleacher clinic within a couple of days he could call to schedule follow-up appointment.  I gave my usual and customary return precautions and he understands and agrees with the plan.   Clinical Course as of 12/28/21 0831  Thu Dec 28, 2021  0830 Of note, the patient takes regular doses of oxycodone 15 mg by mouth at home and he has been waiting in the emergency department long enough that he missed 2 separate doses.  I ordered Percocet 3 tablets to equal the same amount of oxycodone that he is used to as well as having the added benefit of a total of 975 mg of Tylenol. [CF]    Clinical Course User Index [CF] Hinda Kehr, MD     FINAL CLINICAL IMPRESSION(S) / ED DIAGNOSES   Final diagnoses:  Acute bilateral low back pain with right-sided sciatica     Rx / DC Orders   ED Discharge Orders     None        Note:  This document was prepared using Dragon voice recognition software and may include unintentional dictation errors.   Hinda Kehr, MD 12/28/21 0830    Hinda Kehr, MD 12/28/21 [phone]

## 2021-12-28 NOTE — ED Notes (Signed)
Patient resting comfortably in stretcher at this time. Visitor at bedside. Respirations even and unlabored with NAD noted. No needs expressed to RN at this time.

## 2021-12-29 ENCOUNTER — Telehealth: Payer: Self-pay

## 2021-12-29 NOTE — Telephone Encounter (Signed)
We have received a referral on this patient from the ER for acute bilateral low back pain. Upon review he hasn't had a MRI since May 2022 and I dont see any mention about PT or ESIs. You can offer him an appt with Danielle if he would like.

## 2021-12-29 NOTE — Telephone Encounter (Signed)
Left message to call back  

## 2022-01-01 NOTE — Telephone Encounter (Signed)
Left message to call back  

## 2022-02-26 ENCOUNTER — Other Ambulatory Visit: Payer: Self-pay | Admitting: Anesthesiology

## 2022-02-26 DIAGNOSIS — M961 Postlaminectomy syndrome, not elsewhere classified: Secondary | ICD-10-CM

## 2022-03-24 ENCOUNTER — Ambulatory Visit
Admission: RE | Admit: 2022-03-24 | Discharge: 2022-03-24 | Disposition: A | Discharge status: Home / Self Care | Payer: Medicare HMO | Source: Ambulatory Visit | Attending: Anesthesiology | Admitting: Anesthesiology

## 2022-03-24 DIAGNOSIS — M961 Postlaminectomy syndrome, not elsewhere classified: Secondary | ICD-10-CM

## 2022-03-24 MED ORDER — GADOPICLENOL 0.5 MMOL/ML IV SOLN
7.5000 mL | Freq: Once | INTRAVENOUS | Status: AC | PRN
  Administered 2022-03-24: 7.5 mL via INTRAVENOUS

## 2022-05-23 ENCOUNTER — Emergency Department: Payer: Medicare HMO

## 2022-05-23 ENCOUNTER — Inpatient Hospital Stay
Admission: EM | Admit: 2022-05-23 | Discharge: 2022-06-11 | DRG: 853 | Disposition: E | Payer: Medicare HMO | Attending: Pulmonary Disease | Admitting: Pulmonary Disease

## 2022-05-23 ENCOUNTER — Other Ambulatory Visit: Payer: Self-pay

## 2022-05-23 ENCOUNTER — Encounter: Admission: EM | Disposition: E | Payer: Self-pay | Source: Home / Self Care | Attending: Pulmonary Disease

## 2022-05-23 DIAGNOSIS — J9601 Acute respiratory failure with hypoxia: Secondary | ICD-10-CM | POA: Diagnosis not present

## 2022-05-23 DIAGNOSIS — E1165 Type 2 diabetes mellitus with hyperglycemia: Secondary | ICD-10-CM | POA: Diagnosis present

## 2022-05-23 DIAGNOSIS — R6521 Severe sepsis with septic shock: Secondary | ICD-10-CM | POA: Diagnosis present

## 2022-05-23 DIAGNOSIS — Z823 Family history of stroke: Secondary | ICD-10-CM

## 2022-05-23 DIAGNOSIS — K660 Peritoneal adhesions (postprocedural) (postinfection): Secondary | ICD-10-CM | POA: Diagnosis present

## 2022-05-23 DIAGNOSIS — K219 Gastro-esophageal reflux disease without esophagitis: Secondary | ICD-10-CM | POA: Diagnosis present

## 2022-05-23 DIAGNOSIS — I748 Embolism and thrombosis of other arteries: Secondary | ICD-10-CM | POA: Diagnosis not present

## 2022-05-23 DIAGNOSIS — E872 Acidosis, unspecified: Secondary | ICD-10-CM | POA: Diagnosis present

## 2022-05-23 DIAGNOSIS — K551 Chronic vascular disorders of intestine: Secondary | ICD-10-CM | POA: Diagnosis not present

## 2022-05-23 DIAGNOSIS — I10 Essential (primary) hypertension: Secondary | ICD-10-CM | POA: Diagnosis present

## 2022-05-23 DIAGNOSIS — M1990 Unspecified osteoarthritis, unspecified site: Secondary | ICD-10-CM | POA: Diagnosis present

## 2022-05-23 DIAGNOSIS — Z7901 Long term (current) use of anticoagulants: Secondary | ICD-10-CM

## 2022-05-23 DIAGNOSIS — Z79899 Other long term (current) drug therapy: Secondary | ICD-10-CM | POA: Diagnosis not present

## 2022-05-23 DIAGNOSIS — E876 Hypokalemia: Secondary | ICD-10-CM | POA: Diagnosis present

## 2022-05-23 DIAGNOSIS — D62 Acute posthemorrhagic anemia: Secondary | ICD-10-CM | POA: Diagnosis not present

## 2022-05-23 DIAGNOSIS — Z803 Family history of malignant neoplasm of breast: Secondary | ICD-10-CM

## 2022-05-23 DIAGNOSIS — A419 Sepsis, unspecified organism: Principal | ICD-10-CM | POA: Diagnosis present

## 2022-05-23 DIAGNOSIS — E781 Pure hyperglyceridemia: Secondary | ICD-10-CM | POA: Diagnosis present

## 2022-05-23 DIAGNOSIS — Z7984 Long term (current) use of oral hypoglycemic drugs: Secondary | ICD-10-CM

## 2022-05-23 DIAGNOSIS — Z8249 Family history of ischemic heart disease and other diseases of the circulatory system: Secondary | ICD-10-CM

## 2022-05-23 DIAGNOSIS — E785 Hyperlipidemia, unspecified: Secondary | ICD-10-CM

## 2022-05-23 DIAGNOSIS — Z515 Encounter for palliative care: Secondary | ICD-10-CM | POA: Diagnosis not present

## 2022-05-23 DIAGNOSIS — N400 Enlarged prostate without lower urinary tract symptoms: Secondary | ICD-10-CM | POA: Diagnosis present

## 2022-05-23 DIAGNOSIS — G43909 Migraine, unspecified, not intractable, without status migrainosus: Secondary | ICD-10-CM | POA: Diagnosis present

## 2022-05-23 DIAGNOSIS — G894 Chronic pain syndrome: Secondary | ICD-10-CM | POA: Diagnosis present

## 2022-05-23 DIAGNOSIS — E1142 Type 2 diabetes mellitus with diabetic polyneuropathy: Secondary | ICD-10-CM | POA: Diagnosis present

## 2022-05-23 DIAGNOSIS — R6510 Systemic inflammatory response syndrome (SIRS) of non-infectious origin without acute organ dysfunction: Secondary | ICD-10-CM | POA: Diagnosis not present

## 2022-05-23 DIAGNOSIS — Z66 Do not resuscitate: Secondary | ICD-10-CM | POA: Diagnosis not present

## 2022-05-23 DIAGNOSIS — E119 Type 2 diabetes mellitus without complications: Secondary | ICD-10-CM

## 2022-05-23 DIAGNOSIS — K55069 Acute infarction of intestine, part and extent unspecified: Secondary | ICD-10-CM | POA: Diagnosis not present

## 2022-05-23 DIAGNOSIS — K55029 Acute infarction of small intestine, extent unspecified: Secondary | ICD-10-CM | POA: Diagnosis present

## 2022-05-23 DIAGNOSIS — Z79890 Hormone replacement therapy (postmenopausal): Secondary | ICD-10-CM

## 2022-05-23 DIAGNOSIS — R7989 Other specified abnormal findings of blood chemistry: Secondary | ICD-10-CM

## 2022-05-23 DIAGNOSIS — K55059 Acute (reversible) ischemia of intestine, part and extent unspecified: Secondary | ICD-10-CM | POA: Diagnosis not present

## 2022-05-23 DIAGNOSIS — Z981 Arthrodesis status: Secondary | ICD-10-CM

## 2022-05-23 DIAGNOSIS — I2489 Other forms of acute ischemic heart disease: Secondary | ICD-10-CM | POA: Diagnosis present

## 2022-05-23 DIAGNOSIS — Z7189 Other specified counseling: Secondary | ICD-10-CM | POA: Diagnosis not present

## 2022-05-23 DIAGNOSIS — Z888 Allergy status to other drugs, medicaments and biological substances status: Secondary | ICD-10-CM

## 2022-05-23 DIAGNOSIS — R1084 Generalized abdominal pain: Secondary | ICD-10-CM | POA: Diagnosis present

## 2022-05-23 HISTORY — PX: VISCERAL ANGIOGRAPHY: CATH118276

## 2022-05-23 LAB — CBC WITH DIFFERENTIAL/PLATELET
Abs Immature Granulocytes: 0.2 10*3/uL — ABNORMAL HIGH (ref 0.00–0.07)
Basophils Absolute: 0.1 10*3/uL (ref 0.0–0.1)
Basophils Relative: 0 %
Eosinophils Absolute: 0 10*3/uL (ref 0.0–0.5)
Eosinophils Relative: 0 %
HCT: 41.9 % (ref 39.0–52.0)
Hemoglobin: 13.6 g/dL (ref 13.0–17.0)
Immature Granulocytes: 1 %
Lymphocytes Relative: 11 %
Lymphs Abs: 2.1 10*3/uL (ref 0.7–4.0)
MCH: 28.9 pg (ref 26.0–34.0)
MCHC: 32.5 g/dL (ref 30.0–36.0)
MCV: 89 fL (ref 80.0–100.0)
Monocytes Absolute: 0.8 10*3/uL (ref 0.1–1.0)
Monocytes Relative: 4 %
Neutro Abs: 15.5 10*3/uL — ABNORMAL HIGH (ref 1.7–7.7)
Neutrophils Relative %: 84 %
Platelets: 350 10*3/uL (ref 150–400)
RBC: 4.71 MIL/uL (ref 4.22–5.81)
RDW: 13.7 % (ref 11.5–15.5)
WBC: 18.7 10*3/uL — ABNORMAL HIGH (ref 4.0–10.5)
nRBC: 0 % (ref 0.0–0.2)

## 2022-05-23 LAB — URINALYSIS, ROUTINE W REFLEX MICROSCOPIC
Bacteria, UA: NONE SEEN
Bilirubin Urine: NEGATIVE
Glucose, UA: >=500 mg/dL — AB
Hgb urine dipstick: NEGATIVE
Ketones, ur: 5 mg/dL — AB
Leukocytes,Ua: NEGATIVE
Nitrite: NEGATIVE
Protein, ur: NEGATIVE mg/dL
Specific Gravity, Urine: >1.046 — ABNORMAL HIGH (ref 1.005–1.030)
pH: 6 (ref 5.0–8.0)

## 2022-05-23 LAB — COMPREHENSIVE METABOLIC PANEL
ALT: 22 U/L (ref 0–44)
AST: 33 U/L (ref 15–41)
Albumin: 3.8 g/dL (ref 3.5–5.0)
Alkaline Phosphatase: 53 U/L (ref 38–126)
Anion gap: 13 (ref 5–15)
BUN: 18 mg/dL (ref 8–23)
CO2: 22 mmol/L (ref 22–32)
Calcium: 9 mg/dL (ref 8.9–10.3)
Chloride: 100 mmol/L (ref 98–111)
Creatinine, Ser: 1 mg/dL (ref 0.61–1.24)
GFR, Estimated: >60 mL/min (ref >60)
Glucose, Bld: 374 mg/dL — ABNORMAL HIGH (ref 70–99)
Potassium: 2.7 mmol/L — CL (ref 3.5–5.1)
Sodium: 135 mmol/L (ref 135–145)
Total Bilirubin: 0.8 mg/dL (ref 0.3–1.2)
Total Protein: 6.9 g/dL (ref 6.5–8.1)

## 2022-05-23 LAB — GLUCOSE, CAPILLARY
Glucose-Capillary: 350 mg/dL — ABNORMAL HIGH (ref 70–99)
Glucose-Capillary: 350 mg/dL — ABNORMAL HIGH (ref 70–99)

## 2022-05-23 LAB — HEPARIN LEVEL (UNFRACTIONATED): Heparin Unfractionated: 0.2 IU/mL — ABNORMAL LOW (ref 0.30–0.70)

## 2022-05-23 LAB — LACTIC ACID, PLASMA
Lactic Acid, Venous: >9 mmol/L (ref 0.5–1.9)
Lactic Acid, Venous: >9 mmol/L (ref 0.5–1.9)
Lactic Acid, Venous: 6.1 mmol/L (ref 0.5–1.9)
Lactic Acid, Venous: 4.6 mmol/L (ref 0.5–1.9)

## 2022-05-23 LAB — PROTIME-INR
INR: 1.2 (ref 0.8–1.2)
Prothrombin Time: 15.1 seconds (ref 11.4–15.2)

## 2022-05-23 LAB — MAGNESIUM: Magnesium: 1.3 mg/dL — ABNORMAL LOW (ref 1.7–2.4)

## 2022-05-23 LAB — LIPASE, BLOOD: Lipase: 43 U/L (ref 11–51)

## 2022-05-23 LAB — CBG MONITORING, ED
Glucose-Capillary: 340 mg/dL — ABNORMAL HIGH (ref 70–99)
Glucose-Capillary: 338 mg/dL — ABNORMAL HIGH (ref 70–99)

## 2022-05-23 LAB — PHOSPHORUS: Phosphorus: 2.3 mg/dL — ABNORMAL LOW (ref 2.5–4.6)

## 2022-05-23 LAB — APTT: aPTT: 22 seconds — ABNORMAL LOW (ref 24–36)

## 2022-05-23 LAB — PROCALCITONIN: Procalcitonin: <0.1 ng/mL

## 2022-05-23 SURGERY — VISCERAL ANGIOGRAPHY
Anesthesia: Moderate Sedation

## 2022-05-23 MED ORDER — LISINOPRIL 20 MG PO TABS
20.0000 mg | ORAL_TABLET | Freq: Every day | ORAL | Status: DC
  Administered 2022-05-23: 20 mg via ORAL
  Filled 2022-05-23: qty 2

## 2022-05-23 MED ORDER — HYDROMORPHONE HCL 1 MG/ML IJ SOLN: 1.0000 mg | INTRAMUSCULAR | Status: DC | PRN

## 2022-05-23 MED ORDER — POTASSIUM PHOSPHATES 15 MMOLE/5ML IV SOLN
15.0000 mmol | Freq: Once | INTRAVENOUS | Status: AC
  Administered 2022-05-23: 15 mmol via INTRAVENOUS
  Filled 2022-05-23: qty 5

## 2022-05-23 MED ORDER — CEFAZOLIN SODIUM-DEXTROSE 2-4 GM/100ML-% IV SOLN
INTRAVENOUS | Status: AC
  Filled 2022-05-23: qty 100

## 2022-05-23 MED ORDER — HYDROMORPHONE HCL 1 MG/ML IJ SOLN
1.0000 mg | Freq: Once | INTRAMUSCULAR | Status: AC
  Administered 2022-05-23: 1 mg via INTRAVENOUS
  Filled 2022-05-23: qty 1

## 2022-05-23 MED ORDER — FENTANYL CITRATE (PF) 250 MCG/5ML IJ SOLN
INTRAMUSCULAR | Status: AC
  Filled 2022-05-23: qty 5

## 2022-05-23 MED ORDER — METHYLPREDNISOLONE SODIUM SUCC 125 MG IJ SOLR: 125.0000 mg | Freq: Once | INTRAMUSCULAR | Status: DC | PRN

## 2022-05-23 MED ORDER — FAMOTIDINE 20 MG PO TABS: 40.0000 mg | ORAL_TABLET | Freq: Once | ORAL | Status: DC | PRN

## 2022-05-23 MED ORDER — INSULIN ASPART 100 UNIT/ML IJ SOLN
0.0000 [IU] | Freq: Three times a day (TID) | INTRAMUSCULAR | Status: DC
  Administered 2022-05-23: 7 [IU] via SUBCUTANEOUS
  Filled 2022-05-23 (×2): qty 1

## 2022-05-23 MED ORDER — MIDAZOLAM HCL 5 MG/5ML IJ SOLN
INTRAMUSCULAR | Status: AC
  Filled 2022-05-23: qty 5

## 2022-05-23 MED ORDER — HYDROMORPHONE HCL 1 MG/ML IJ SOLN
INTRAMUSCULAR | Status: AC
  Administered 2022-05-23: 1 mg
  Filled 2022-05-23: qty 1

## 2022-05-23 MED ORDER — IODIXANOL 320 MG/ML IV SOLN
INTRAVENOUS | Status: DC | PRN
  Administered 2022-05-23: 70 mL

## 2022-05-23 MED ORDER — FENTANYL CITRATE PF 50 MCG/ML IJ SOSY
50.0000 ug | PREFILLED_SYRINGE | Freq: Once | INTRAMUSCULAR | Status: AC
  Administered 2022-05-24: 50 ug via INTRAVENOUS
  Filled 2022-05-23: qty 1

## 2022-05-23 MED ORDER — GABAPENTIN 400 MG PO CAPS
800.0000 mg | ORAL_CAPSULE | Freq: Every day | ORAL | Status: DC
  Administered 2022-05-23: 800 mg via ORAL
  Filled 2022-05-23: qty 2

## 2022-05-23 MED ORDER — NALOXONE HCL 0.4 MG/ML IJ SOLN: 0.4000 mg | INTRAMUSCULAR | Status: DC | PRN

## 2022-05-23 MED ORDER — ONDANSETRON HCL 4 MG/2ML IJ SOLN
4.0000 mg | Freq: Once | INTRAMUSCULAR | Status: AC
  Administered 2022-05-23: 4 mg via INTRAVENOUS
  Filled 2022-05-23: qty 2

## 2022-05-23 MED ORDER — HEPARIN (PORCINE) 25000 UT/250ML-% IV SOLN
1500.0000 [IU]/h | INTRAVENOUS | Status: DC
  Administered 2022-05-23 (×2): 1300 [IU]/h via INTRAVENOUS
  Filled 2022-05-23 (×2): qty 250

## 2022-05-23 MED ORDER — DROPERIDOL 2.5 MG/ML IJ SOLN
2.5000 mg | Freq: Once | INTRAMUSCULAR | Status: AC
  Administered 2022-05-23: 2.5 mg via INTRAVENOUS
  Filled 2022-05-23: qty 2

## 2022-05-23 MED ORDER — MAGNESIUM SULFATE 2 GM/50ML IV SOLN: 2.0000 g | Freq: Once | INTRAVENOUS | Status: DC

## 2022-05-23 MED ORDER — HEPARIN SODIUM (PORCINE) 1000 UNIT/ML IJ SOLN
INTRAMUSCULAR | Status: DC | PRN
  Administered 2022-05-23: 3000 [IU] via INTRAVENOUS

## 2022-05-23 MED ORDER — KETAMINE HCL 50 MG/5ML IJ SOSY
0.3000 mg/kg | PREFILLED_SYRINGE | Freq: Once | INTRAMUSCULAR | Status: AC
  Administered 2022-05-23: 24 mg via INTRAVENOUS
  Filled 2022-05-23: qty 5

## 2022-05-23 MED ORDER — POTASSIUM CHLORIDE 10 MEQ/100ML IV SOLN
10.0000 meq | INTRAVENOUS | Status: AC
  Administered 2022-05-23 (×2): 10 meq via INTRAVENOUS
  Filled 2022-05-23 (×2): qty 100

## 2022-05-23 MED ORDER — HYDROMORPHONE HCL 1 MG/ML IJ SOLN
INTRAMUSCULAR | Status: AC
  Filled 2022-05-23: qty 1

## 2022-05-23 MED ORDER — MAGNESIUM SULFATE 2 GM/50ML IV SOLN
2.0000 g | Freq: Once | INTRAVENOUS | Status: AC
Start: 2022-05-23 — End: 2022-05-23
  Administered 2022-05-23: 2 g via INTRAVENOUS
  Filled 2022-05-23: qty 50

## 2022-05-23 MED ORDER — DIPHENHYDRAMINE HCL 50 MG/ML IJ SOLN: 50.0000 mg | Freq: Once | INTRAMUSCULAR | Status: DC | PRN

## 2022-05-23 MED ORDER — SODIUM CHLORIDE 0.9 % IV SOLN: INTRAVENOUS | Status: DC

## 2022-05-23 MED ORDER — ONDANSETRON HCL 4 MG/2ML IJ SOLN
4.0000 mg | Freq: Three times a day (TID) | INTRAMUSCULAR | Status: DC | PRN
  Filled 2022-05-23: qty 2

## 2022-05-23 MED ORDER — FENTANYL CITRATE (PF) 100 MCG/2ML IJ SOLN
INTRAMUSCULAR | Status: DC | PRN
  Administered 2022-05-23 (×3): 50 ug via INTRAVENOUS

## 2022-05-23 MED ORDER — MIDAZOLAM HCL 2 MG/2ML IJ SOLN
INTRAMUSCULAR | Status: DC | PRN
  Administered 2022-05-23: 1 mg via INTRAVENOUS
  Administered 2022-05-23 (×2): 2 mg via INTRAVENOUS

## 2022-05-23 MED ORDER — ONDANSETRON HCL 4 MG/2ML IJ SOLN
4.0000 mg | Freq: Four times a day (QID) | INTRAMUSCULAR | Status: DC | PRN
  Administered 2022-05-23: 4 mg via INTRAVENOUS

## 2022-05-23 MED ORDER — POTASSIUM CHLORIDE CRYS ER 20 MEQ PO TBCR
40.0000 meq | EXTENDED_RELEASE_TABLET | ORAL | Status: AC
  Filled 2022-05-23: qty 2

## 2022-05-23 MED ORDER — LACTATED RINGERS IV BOLUS
1000.0000 mL | Freq: Once | INTRAVENOUS | Status: AC
  Administered 2022-05-23: 1000 mL via INTRAVENOUS

## 2022-05-23 MED ORDER — HYDROMORPHONE HCL 1 MG/ML IJ SOLN: 1.0000 mg | Freq: Once | INTRAMUSCULAR | Status: DC | PRN

## 2022-05-23 MED ORDER — CEFAZOLIN SODIUM-DEXTROSE 2-4 GM/100ML-% IV SOLN: 2.0000 g | INTRAVENOUS | Status: DC

## 2022-05-23 MED ORDER — TAMSULOSIN HCL 0.4 MG PO CAPS
0.4000 mg | ORAL_CAPSULE | Freq: Every day | ORAL | Status: DC
  Administered 2022-05-23: 0.4 mg via ORAL
  Filled 2022-05-23 (×2): qty 1

## 2022-05-23 MED ORDER — HEPARIN SODIUM (PORCINE) 1000 UNIT/ML IJ SOLN
INTRAMUSCULAR | Status: AC
  Filled 2022-05-23: qty 10

## 2022-05-23 MED ORDER — OXYCODONE HCL 5 MG PO TABS
15.0000 mg | ORAL_TABLET | ORAL | Status: DC | PRN
  Administered 2022-05-23: 15 mg via ORAL
  Filled 2022-05-23: qty 3

## 2022-05-23 MED ORDER — HEPARIN (PORCINE) 25000 UT/250ML-% IV SOLN: 1500.0000 [IU]/h | INTRAVENOUS | Status: DC

## 2022-05-23 MED ORDER — HEPARIN BOLUS VIA INFUSION
5000.0000 [IU] | Freq: Once | INTRAVENOUS | Status: AC
  Administered 2022-05-23: 5000 [IU] via INTRAVENOUS
  Filled 2022-05-23: qty 5000

## 2022-05-23 MED ORDER — HYDROMORPHONE HCL 1 MG/ML IJ SOLN
INTRAMUSCULAR | Status: AC
  Administered 2022-05-23: 1 mg via INTRAVENOUS
  Filled 2022-05-23: qty 1

## 2022-05-23 MED ORDER — MIDAZOLAM HCL 2 MG/ML PO SYRP: 8.0000 mg | ORAL_SOLUTION | Freq: Once | ORAL | Status: DC | PRN

## 2022-05-23 MED ORDER — ZOLPIDEM TARTRATE 5 MG PO TABS
10.0000 mg | ORAL_TABLET | Freq: Every day | ORAL | Status: DC
  Administered 2022-05-23: 10 mg via ORAL
  Filled 2022-05-23: qty 2

## 2022-05-23 MED ORDER — INSULIN ASPART 100 UNIT/ML IJ SOLN
0.0000 [IU] | Freq: Every day | INTRAMUSCULAR | Status: DC
  Administered 2022-05-23: 4 [IU] via SUBCUTANEOUS
  Filled 2022-05-23: qty 1

## 2022-05-23 MED ORDER — CEFAZOLIN SODIUM-DEXTROSE 1-4 GM/50ML-% IV SOLN
INTRAVENOUS | Status: DC | PRN
  Administered 2022-05-23: 2 g via INTRAVENOUS

## 2022-05-23 MED ORDER — ACETAMINOPHEN 325 MG PO TABS
650.0000 mg | ORAL_TABLET | Freq: Four times a day (QID) | ORAL | Status: DC | PRN
  Filled 2022-05-23: qty 2

## 2022-05-23 MED ORDER — IOHEXOL 300 MG/ML  SOLN
100.0000 mL | Freq: Once | INTRAMUSCULAR | Status: AC | PRN
  Administered 2022-05-23: 100 mL via INTRAVENOUS

## 2022-05-23 MED ORDER — HYDROMORPHONE HCL 1 MG/ML IJ SOLN
INTRAMUSCULAR | Status: AC
  Filled 2022-05-23: qty 0.5

## 2022-05-23 MED ORDER — PIPERACILLIN-TAZOBACTAM 3.375 G IVPB 30 MIN
3.3750 g | Freq: Once | INTRAVENOUS | Status: AC
Start: 2022-05-23 — End: 2022-05-23
  Administered 2022-05-23: 3.375 g via INTRAVENOUS
  Filled 2022-05-23: qty 50

## 2022-05-23 MED ORDER — HYDROMORPHONE HCL 1 MG/ML IJ SOLN
1.0000 mg | INTRAMUSCULAR | Status: DC | PRN
  Administered 2022-05-23 – 2022-05-24 (×4): 1 mg via INTRAVENOUS
  Filled 2022-05-23 (×4): qty 1

## 2022-05-23 MED ORDER — ALTEPLASE 2 MG IJ SOLR
INTRAMUSCULAR | Status: DC | PRN
  Administered 2022-05-23: 6 mg

## 2022-05-23 MED ORDER — HEPARIN BOLUS VIA INFUSION
1200.0000 [IU] | Freq: Once | INTRAVENOUS | Status: AC
  Administered 2022-05-23: 1200 [IU] via INTRAVENOUS
  Filled 2022-05-23: qty 1200

## 2022-05-23 MED ORDER — HYDRALAZINE HCL 20 MG/ML IJ SOLN: 5.0000 mg | INTRAMUSCULAR | Status: DC | PRN

## 2022-05-23 MED ORDER — IOHEXOL 350 MG/ML SOLN
75.0000 mL | Freq: Once | INTRAVENOUS | Status: AC | PRN
  Administered 2022-05-23: 75 mL via INTRAVENOUS

## 2022-05-23 MED ORDER — HYDROMORPHONE HCL 1 MG/ML IJ SOLN
INTRAMUSCULAR | Status: DC | PRN
  Administered 2022-05-23 (×2): 1 mg via INTRAVENOUS

## 2022-05-23 MED ORDER — PANTOPRAZOLE SODIUM 20 MG PO TBEC
20.0000 mg | DELAYED_RELEASE_TABLET | Freq: Every day | ORAL | Status: DC
  Administered 2022-05-23: 20 mg via ORAL
  Filled 2022-05-23 (×2): qty 1

## 2022-05-23 SURGICAL SUPPLY — 27 items
BALLN ULTRVRSE 2X100X150 (BALLOONS) ×1
BALLN ULTRVRSE 3X100X150 (BALLOONS) ×1
BALLOON ULTRVRSE 2X100X150 (BALLOONS) IMPLANT
BALLOON ULTRVRSE 3X100X150 (BALLOONS) IMPLANT
CANISTER PENUMBRA ENGINE (MISCELLANEOUS) IMPLANT
CATH BEACON 5 .035 100 C2 TIP (CATHETERS) IMPLANT
CATH INDIGO SEP 7 (CATHETERS) IMPLANT
CATH LIGHTNING BOLT 7 130 (CATHETERS) IMPLANT
CATH OMNI FLUSH 5F 65CM (CATHETERS) IMPLANT
DEVICE SAFEGUARD 24CM (GAUZE/BANDAGES/DRESSINGS) IMPLANT
DEVICE STARCLOSE SE CLOSURE (Vascular Products) IMPLANT
DEVICE TORQUE .025-.038 (MISCELLANEOUS) IMPLANT
GLIDEWIRE ADV .035X180CM (WIRE) IMPLANT
GLIDEWIRE STIFF .35X180X3 HYDR (WIRE) IMPLANT
GUIDEWIRE PFTE-COATED .018X300 (WIRE) IMPLANT
KIT ENCORE 26 ADVANTAGE (KITS) IMPLANT
NDL ENTRY 21GA 7CM ECHOTIP (NEEDLE) IMPLANT
NEEDLE ENTRY 21GA 7CM ECHOTIP (NEEDLE) ×1 IMPLANT
PACK ANGIOGRAPHY (CUSTOM PROCEDURE TRAY) ×1 IMPLANT
SET INTRO CAPELLA COAXIAL (SET/KITS/TRAYS/PACK) IMPLANT
SHEATH BRITE TIP 5FRX11 (SHEATH) IMPLANT
SHEATH FLEXOR ANSEL2 7FRX45 (SHEATH) IMPLANT
STENT LIFESTREAM 8X37X80 (Permanent Stent) IMPLANT
SYR MEDRAD MARK 7 150ML (SYRINGE) IMPLANT
TUBING CONTRAST HIGH PRESS 72 (TUBING) IMPLANT
WIRE GUIDERIGHT .035X150 (WIRE) IMPLANT
WIRE SUPRACORE 300CM (MISCELLANEOUS) IMPLANT
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
## 2022-05-23 NOTE — Progress Notes (Signed)
Education patient not to move affect leg post surgery. Patient/family verbalized understanding.
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
## 2022-05-23 NOTE — ED Notes (Signed)
MD notified of critical lab. Ultrasound IV placed and stat CT angio ordered.

## 2022-05-23 NOTE — ED Notes (Signed)
EDP at bedside examining pt. Pt will not stay still.

## 2022-05-23 NOTE — Consult Note (Signed)
[name] for heparin infusion Indication:  Superior mesenteric artery thrombosis  Allergies  Allergen Reactions   Adhesive [Tape] Other (See Comments)    Honeycomb dressing, blisters    Patient Measurements: Height: '5\' 7"'$  (170.2 cm) (from 3/23 visit) Weight: 80.3 kg (177 lb) IBW/kg (Calculated) : 66.1 Heparin Dosing Weight: 80.3 kg  Vital Signs: Temp: 98.2 F (36.8 C) (12/13 2054) Temp Source: Oral (12/13 2054) BP: 146/105 (12/13 2152) Pulse Rate: 125 (12/13 2054)  Labs: Recent Labs    06/02/2022 0941 05/22/2022 1450 05/22/2022 2137  HGB 13.6  --   --   HCT 41.9  --   --   PLT 350  --   --   APTT  --  22*  --   LABPROT  --  15.1  --   INR  --  1.2  --   HEPARINUNFRC  --   --  0.20*  CREATININE 1.00  --   --      Estimated Creatinine Clearance: 73.8 mL/min (by C-G formula based on SCr of 1 mg/dL).   Medical History: Past Medical History:  Diagnosis Date   Arthritis    Osteo vs rheumatoid ?   Back ache 05/05/2013   Diabetes mellitus without complication (HCC)    GERD (gastroesophageal reflux disease)    Headache    MIGRAINES   History of kidney stones    Hyperlipemia    Hypertension    Kidney stones    Kidney stones    Nephrolithiasis    Shortness of breath dyspnea     Medications:  No home anticoagulation per pharmacist review  Assessment: 66yo male presented to ED with severe abdominal pain.  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 No chronic anticoagulation discovered PTA  Baseline Labs: aPTT 22s, INR 1.2  Goal of Therapy:  Heparin level 0.3-0.7 units/ml Monitor platelets by anticoagulation protocol: Yes   Plan: heparin level subtherapeutic ---Give 1200 units IV heparin bolus x 1 ---increase heparin infusion rate to 1500 units/hr ---Check anti-Xa level in 6 hours after rate change ---Continue to monitor H&H and platelets  Dallie Piles, PharmD 05/30/2022,10:22 PM

## 2022-05-23 NOTE — ED Notes (Signed)
Pt to xray now. Provider wants xray first.

## 2022-05-23 NOTE — ED Notes (Signed)
Blue top sent to lab for baseline clotting studies before heparin. Vascular MD saw pt at bedside.

## 2022-05-23 NOTE — ED Notes (Signed)
Redraw of lav and lt green labs. Last set drawn was lost in tube system.

## 2022-05-23 NOTE — Op Note (Signed)
[facility]  Percutaneous Study/Intervention Procedural Note   Date: 05/22/2022  Surgeon(s): [name], MD  Assistants: none  Pre-operative Diagnosis: 1.  Acute Mesenteric ischemia with SMA embolus    Post-operative diagnosis:  Same  Procedure(s) Performed:             1.  Ultrasound guidance for vascular access right femoral artery              2.  Catheter placement into SMA from right femoral approach             3.  Aortogram and selective angiogram of the SMA  4.  Mechanical thrombectomy of the SMA with the penumbra CAT 7 lightning device to the SMA and multiple primary SMA branches             5.  Stent to the proximal SMA with 8 mm diameter x 87 mm length balloon expandable stent  6.  Angioplasty of 2 primary SMA branches 1 with a 2 mm balloon and 1 with a 2 and a 3 mm balloon             7.  StarClose closure device right femoral artery  Contrast: 70 cc  Fluoro time: 18.7 minutes  EBL: 150 cc  Anesthesia: Approximately 67 minutes of Moderate conscious sedation using 5 mg of Versed and 150 mcg of Fentanyl              Indications:  Patient is a 66 y.o. male who has symptoms consistent with mesenteric ischemia. The patient has a CT scan showing an SMA embolus. The patient is brought in for angiography for further evaluation and potential treatment. Risks and benefits are discussed and informed consent is obtained  Procedure:  The patient was identified and appropriate procedural time out was performed.  The patient was then placed supine on the table and prepped and draped in the usual sterile fashion. Moderate conscious sedation was administered during a face to face encounter with the patient throughout the procedure with my supervision of the RN administering medicines and monitoring the patient's vital signs, pulse oximetry, telemetry and mental status throughout from the start of the procedure until the patient was taken to the recovery room.  Ultrasound was used to evaluate the right common femoral artery.  It was patent .  A digital ultrasound image was acquired.  A Seldinger needle was used to access the right common femoral artery under direct ultrasound guidance and a permanent image was performed.  A 0.035 J wire was advanced without resistance and a 5Fr sheath was placed.  Pigtail catheter was placed into the aorta. We transitioned to the lateral projection to image the celiac and SMA. The lateral image demonstrated what appeared to be a fairly normal celiac artery but the SMA was occluded with clear evidence of thrombus and no flow.  The patient was given 3000 units of IV heparin. We upsized to a 7 Fr Ansell sheath.  A C2 catheter was used to selectively cannulate the SMA and selective imaging was performed.  This demonstrated an occlusion of the SMA with clear thrombus.  There is essentially no flow distally in any of the distal branches and the thrombus extended to the origin of the SMA.  I got an advantage wire out into the distal SMA and one of the primary branches.  I then placed the penumbra 7 lightening device into the SMA and exchanged the advantage wire for a separator and performed  extensive thrombectomy of the main SMA and 3 primary SMA branches with the lightening 7 penumbra thrombectomy device.  A large amount of thrombus was removed, but there remains some thrombus in the proximal SMA and I elected to treat this with a covered stent.  I then used a 8 mm diameter x 37 mm length balloon expandable stent to perform treatment of the proximal SMA. I inflated the balloon to 12 atm. On completion angiogram following this, less than 10% residual stenosis was identified in the proximal SMA with resolution of the stent.  At this point, the 2 initial branches including the middle colic branch of the SMA were now widely patent.  I then got out into the small bowel branches and the largest of which was cannulated with a 0.018 advantage wire.  I  took the penumbra CAT 7 lightning device out into this primary branch perform thrombectomy with minimal improvement.  I then treated this vessel with a 2 and a 3 mm diameter angioplasty balloon inflated to 10 atm for 1 minute.  Completion imaging now showed this branch to be open but there was still a large number of branches with absent flow.  A secondary SMA branch was then cannulated with a 0.018 advantage wire and then treated with thrombectomy with the CAT 7 lightning device initially and then with a 2 mm diameter angioplasty balloon.  At this point, there were really no other sizable branches that we could treat and we had improved the flow is much as possible from an endovascular standpoint. At this point, I elected to terminate the procedure. The diagnostic catheter was removed. StarClose closure device was deployed in usual fashion with excellent hemostatic result. The patient was taken to the recovery room in stable condition having tolerated the procedure well.     Findings: Complete thrombosis of the SMA with minimal flow seen in any SMA branches on initial imaging with some improvement after endovascular intervention.  Disposition: Patient was taken to the recovery room in stable condition having tolerated the procedure well.  Complications:  None  [name] 06/07/2022 4:58 PM   This note was created with Dragon Medical transcription system. Any errors in dictation are purely unintentional.

## 2022-05-23 NOTE — ED Notes (Signed)
Pt back from xray. Called CT to come now, Dr Starleen Blue said for pt to go to CT now due to severe distress. CT called and on way.

## 2022-05-23 NOTE — Progress Notes (Signed)
Called to let Dr. Lucky Cowboy know of pt's HR in the 140's and BP over 514'U systolic , 047'V diastolic and that pt had attempted to urinate but was only able to get out about 30cc. No new orders regarding HR and BP but verbal for urethral cath.  In and out cath attempted by Grier Rocher with this RN as witness, unable to pass prostate, pt requested catheter removal and to try urinal again. Foreskin returned to normal position and urinal in place. Will continue to monitor .

## 2022-05-23 NOTE — Consult Note (Signed)
Howard Murphy  MRN : [id number]  Howard Murphy is a 66 y.o. ([birth year]) male who presents with chief complaint of  Chief Complaint  Patient presents with   Abdominal Pain  .  History of Present Illness: I am asked to see the patient by Dr. Starleen Blue for evaluation of acute mesenteric ischemia.  Patient began having abdominal pain about 8 to 10 hours ago and this was prompt and acute without an inciting event.  He has severe, diffuse abdominal pain.  He has had a workup by the emergency department and undergone a CT angiogram of the abdomen pelvis which I have independently reviewed which demonstrates a clear SMA embolus without significant atherosclerotic disease.  If you are doing good his lactate is elevated but there was not obvious signs of bowel gangrene on the CT scan.  General surgery's been consulted as well but we are consulted for further evaluation and treatment of his acute mesenteric ischemia.  Current Facility-Administered Medications  Medication Dose Route Frequency Provider Last Rate Last Admin   acetaminophen (TYLENOL) tablet 650 mg  650 mg Oral Q6H PRN Ivor Costa, MD       hydrALAZINE (APRESOLINE) injection 5 mg  5 mg Intravenous Q2H PRN Ivor Costa, MD       HYDROmorphone (DILAUDID) injection 1 mg  1 mg Intravenous Q3H PRN Ivor Costa, MD       magnesium sulfate IVPB 2 g 50 mL  2 g Intravenous Once Rada Hay, MD 50 mL/hr at 06/05/2022 1421 2 g at 06/09/2022 1421   ondansetron (ZOFRAN) injection 4 mg  4 mg Intravenous Q8H PRN Ivor Costa, MD       piperacillin-tazobactam (ZOSYN) IVPB 3.375 g  3.375 g Intravenous Once Rada Hay, MD 100 mL/hr at 05/17/2022 1422 3.375 g at 06/07/2022 1422   potassium chloride SA (KLOR-CON M) CR tablet 40 mEq  40 mEq Oral Q4H Ivor Costa, MD       Current Outpatient Medications  Medication Sig Dispense Refill   celecoxib (CELEBREX) 200 MG capsule Take 200 mg by mouth 2 (two) times daily.      chlorthalidone (HYGROTON) 50 MG tablet Take 50 mg by mouth daily.     gabapentin (NEURONTIN) 800 MG tablet Take 800 mg by mouth at bedtime.     lisinopril (ZESTRIL) 20 MG tablet Take 20 mg by mouth daily.     naloxone (NARCAN) nasal spray 4 mg/0.1 mL SMARTSIG:Both Nares     naproxen (NAPROSYN) 500 MG tablet SMARTSIG:1 Tablet(s) By Mouth Every 12 Hours     ondansetron (ZOFRAN-ODT) 8 MG disintegrating tablet Take 8 mg by mouth every 8 (eight) hours as needed.     oxyCODONE (ROXICODONE) 15 MG immediate release tablet Take 15 mg by mouth every 4 (four) hours as needed.     testosterone cypionate (DEPOTESTOSTERONE CYPIONATE) 200 MG/ML injection Inject 200 mg into the muscle every 7 (seven) days.     acetaminophen (TYLENOL) 500 MG tablet Take 2 tablets (1,000 mg total) by mouth every 6 (six) hours. 30 tablet 0   atorvastatin (LIPITOR) 40 MG tablet Take 1 tablet (40 mg total) by mouth at bedtime. (Patient not taking: Reported on 05/22/2022) 30 tablet 5   cyclobenzaprine (FLEXERIL) 5 MG tablet Take by mouth.     etodolac (LODINE) 400 MG tablet Take 1 tablet (400 mg total) by mouth 2 (two) times daily. (Patient not taking: Reported on 06/01/2022) 14 tablet 0   HYDROcodone-acetaminophen (NORCO/VICODIN)  5-325 MG tablet Take 1 tablet by mouth every 6 (six) hours as needed for moderate pain. 15 tablet 0   JANUVIA 100 MG tablet Take 100 mg by mouth daily.     JARDIANCE 25 MG TABS tablet Take 25 mg by mouth daily.     meclizine (ANTIVERT) 25 MG tablet Take 25 mg by mouth every 6 (six) hours as needed for dizziness.     methocarbamol (ROBAXIN) 500 MG tablet 1-2 tablets every 6 hours prn muscles spasms (Patient not taking: Reported on 05/27/2022) 20 tablet 0   omeprazole (PRILOSEC OTC) 20 MG tablet Take 1 tablet (20 mg total) by mouth daily for 20 days. 20 tablet 0   oxyCODONE-acetaminophen (PERCOCET) 10-325 MG tablet Take 1 tablet by mouth every 8 (eight) hours as needed for pain. Must last 30 days. 90 tablet 0    senna-docusate (SENOKOT-S) 8.6-50 MG tablet Take 1 tablet by mouth 2 (two) times daily. 30 tablet 0   tamsulosin (FLOMAX) 0.4 MG CAPS capsule Take 1 capsule (0.4 mg total) by mouth daily. 14 capsule 0   zolpidem (AMBIEN) 10 MG tablet Take 10 mg by mouth at bedtime.      Past Medical History:  Diagnosis Date   Arthritis    Osteo vs rheumatoid ?   Back ache 05/05/2013   Diabetes mellitus without complication (HCC)    GERD (gastroesophageal reflux disease)    Headache    MIGRAINES   History of kidney stones    Hyperlipemia    Hypertension    Kidney stones    Kidney stones    Nephrolithiasis    Shortness of breath dyspnea     Past Surgical History:  Procedure Laterality Date   APPENDECTOMY     BACK SURGERY  2015   x3   BACK SURGERY  08/09/2016   BACK SURGERY Bilateral 2019   CHOLECYSTECTOMY  2000   COLONOSCOPY WITH PROPOFOL N/A 04/11/2015   Procedure: COLONOSCOPY WITH PROPOFOL;  Surgeon: Manya Silvas, MD;  Location: [facility];  Service: Endoscopy;  Laterality: N/A;   CYSTOSCOPY WITH URETEROSCOPY AND STENT PLACEMENT Right 08/27/2017   Procedure: CYSTOSCOPY WITH URETEROSCOPY AND STENT PLACEMENT;  Surgeon: Abbie Sons, MD;  Location: [facility];  Service: Urology;  Laterality: Right;   ESOPHAGOGASTRODUODENOSCOPY (EGD) WITH PROPOFOL N/A 04/11/2015   Procedure: ESOPHAGOGASTRODUODENOSCOPY (EGD) WITH PROPOFOL;  Surgeon: Manya Silvas, MD;  Location: [facility];  Service: Endoscopy;  Laterality: N/A;   INCISION AND DRAINAGE N/A 10/22/2020   Procedure: INCISION AND DRAINAGE of POSTERIOR LUMBAR;  Surgeon: Meade Maw, MD;  Location: [facility];  Service: Neurosurgery;  Laterality: N/A;   LAPAROSCOPIC APPENDECTOMY N/A 12/18/2017   Procedure: APPENDECTOMY LAPAROSCOPIC;  Surgeon: Florene Glen, MD;  Location: [facility];  Service: General;  Laterality: N/A;   POSTERIOR LUMBAR FUSION 4 LEVEL N/A 09/14/2020   Procedure: OPEN T10-PELVIS POSTERIOR SPINAL FUSION (HARDWARE  ALREADY PRESENT AT L2-S2); L1-2 POSTERIOR SPINAL DECOMPRESSION;  Surgeon: Meade Maw, MD;  Location: [facility];  Service: Neurosurgery;  Laterality: N/A;   SHOULDER SURGERY Right 2016   X2   SHOULDER SURGERY Left 06/2015     Social History   Tobacco Use   Smoking status: Never   Smokeless tobacco: Former    Types: Chew    Quit date: 01/18/2013  Vaping Use   Vaping Use: Never used  Substance Use Topics   Alcohol use: No   Drug use: No    Family History  Problem Relation Age of Onset  Cancer Mother        Breast CA   Stroke Father    Heart disease Father    Cancer Sister    Cancer Maternal Grandmother    Cancer Sister    Bladder Cancer Neg Hx    Prostate cancer Neg Hx    Kidney cancer Neg Hx     Allergies  Allergen Reactions   Adhesive [Tape] Other (See Comments)    Honeycomb dressing, blisters     REVIEW OF SYSTEMS (Negative unless checked)  Constitutional: '[]'$ Weight loss  '[]'$ Fever  '[]'$ Chills Cardiac: '[]'$ Chest pain   '[]'$ Chest pressure   '[]'$ Palpitations   '[]'$ Shortness of breath when laying flat   '[]'$ Shortness of breath at rest   '[]'$ Shortness of breath with exertion. Vascular:  '[]'$ Pain in legs with walking   '[]'$ Pain in legs at rest   '[]'$ Pain in legs when laying flat   '[]'$ Claudication   '[]'$ Pain in feet when walking  '[]'$ Pain in feet at rest  '[]'$ Pain in feet when laying flat   '[]'$ History of DVT   '[]'$ Phlebitis   '[]'$ Swelling in legs   '[]'$ Varicose veins   '[]'$ Non-healing ulcers Pulmonary:   '[]'$ Uses home oxygen   '[]'$ Productive cough   '[]'$ Hemoptysis   '[]'$ Wheeze  '[]'$ COPD   '[]'$ Asthma Neurologic:  '[]'$ Dizziness  '[]'$ Blackouts   '[]'$ Seizures   '[]'$ History of stroke   '[]'$ History of TIA  '[]'$ Aphasia   '[]'$ Temporary blindness   '[]'$ Dysphagia   '[]'$ Weakness or numbness in arms   '[]'$ Weakness or numbness in legs Musculoskeletal:  '[x]'$ Arthritis   '[]'$ Joint swelling   '[]'$ Joint pain   '[]'$ Low back pain Hematologic:  '[]'$ Easy bruising  '[]'$ Easy bleeding   '[]'$ Hypercoagulable state   '[]'$ Anemic  '[]'$ Hepatitis Gastrointestinal:  '[]'$ Blood in stool    '[]'$ Vomiting blood  '[x]'$ Gastroesophageal reflux/heartburn   '[]'$ Difficulty swallowing. X for abdominal pain Genitourinary:  '[]'$ Chronic kidney disease   '[]'$ Difficult urination  '[]'$ Frequent urination  '[]'$ Burning with urination   '[]'$ Blood in urine Skin:  '[]'$ Rashes   '[]'$ Ulcers   '[]'$ Wounds Psychological:  '[]'$ History of anxiety   '[]'$  History of major depression.  Physical Examination  Vitals:   05/22/2022 0834 06/02/2022 1052 05/19/2022 1219 06/07/2022 1412  BP: (!) 153/81  (!) 184/86   Pulse:   (!) 104   Resp:   (!) 22   Temp:   97.6 F (36.4 C)   TempSrc:   Oral   SpO2:   96%   Weight:  80.3 kg    Height:    '5\' 7"'$  (1.702 m)   Body mass index is 27.72 kg/m. Gen:  WD/WN, NAD Head: [place]/AT, No temporalis wasting.  Ear/Nose/Throat: Hearing grossly intact, nares w/o erythema or drainage, oropharynx w/o Erythema/Exudate Eyes: Sclera non-icteric, conjunctiva clear Neck: Trachea midline.  No JVD.  Pulmonary:  Good air movement, respirations not labored, equal bilaterally.  Cardiac: Irregular and tachycardic Vascular:  Vessel Right Left  Radial Palpable Palpable                          PT Palpable Palpable  DP Palpable Palpable   Gastrointestinal: soft, diffusely tender Musculoskeletal: M/S 5/5 throughout.  Extremities without ischemic changes.  No deformity or atrophy. No edema. Neurologic: Sensation grossly intact in extremities.  Symmetrical.  Speech is fluent. Motor exam as listed above. Psychiatric: Judgment intact, Mood & affect appropriate for pt's clinical situation. Dermatologic: No rashes or ulcers noted.  No cellulitis or open wounds.      CBC Lab Results  Component Value Date  WBC 18.7 (H) 05/29/2022   HGB 13.6 05/19/2022   HCT 41.9 05/18/2022   MCV 89.0 05/20/2022   PLT 350 05/21/2022    BMET    Component Value Date/Time   NA 135 06/01/2022 0941   NA 142 03/07/2017 1443   NA 135 (L) 03/23/2014 2002   K 2.7 (LL) 06/07/2022 0941   K 2.8 (L) 03/23/2014 2002   CL 100  06/08/2022 0941   CL 98 03/23/2014 2002   CO2 22 05/30/2022 0941   CO2 28 03/23/2014 2002   GLUCOSE 374 (H) 06/10/2022 0941   GLUCOSE 181 (H) 03/23/2014 2002   BUN 18 05/22/2022 0941   BUN 14 03/07/2017 1443   BUN 16 03/23/2014 2002   CREATININE 1.00 05/13/2022 0941   CREATININE 0.95 02/24/2016 1557   CALCIUM 9.0 05/22/2022 0941   CALCIUM 8.4 (L) 03/23/2014 2002   GFRNONAA >60 05/15/2022 0941   GFRNONAA 87 02/24/2016 1557   GFRAA >60 12/22/2019 0231   GFRAA >89 02/24/2016 1557   Estimated Creatinine Clearance: 73.8 mL/min (by C-G formula based on SCr of 1 mg/dL).  COAG Lab Results  Component Value Date   INR 1.1 09/02/2020   INR 1.00 03/25/2018    Radiology CT Angio Abd/Pel w/ and/or w/o  Result Date: 06/10/2022 CLINICAL DATA:  Mesenteric ischemia, abdominal pain, elevated lactate EXAM: CTA ABDOMEN AND PELVIS WITHOUT AND WITH CONTRAST TECHNIQUE: Multidetector CT imaging of the abdomen and pelvis was performed using the standard protocol during bolus administration of intravenous contrast. Multiplanar reconstructed images and MIPs were obtained and reviewed to evaluate the vascular anatomy. RADIATION DOSE REDUCTION: This exam was performed according to the departmental dose-optimization program which includes automated exposure control, adjustment of the mA and/or kV according to patient size and/or use of iterative reconstruction technique. CONTRAST:  79m OMNIPAQUE IOHEXOL 350 MG/ML SOLN COMPARISON:  CT 12/22/2019 FINDINGS: VASCULAR Aorta: Minimal calcified aortic atheromatous plaque. No aneurysm, dissection, or stenosis. Celiac: Patent without evidence of aneurysm, dissection, vasculitis or significant stenosis. SMA: There is occlusive thrombus in the SMA extending throughout most of its length from just beyond the origin, with limited segmental areas of collateral reconstitution. The thrombus extends into multiple SMA branches. No definite lesion is identified in the native vessel,  suggesting possible embolic origin. Renals: Single left renal artery, widely patent. Duplicated right renal arteries, superior dominant, both patent. IMA: Patent without evidence of aneurysm, dissection, vasculitis or significant stenosis. Inflow: Minimal nonocclusive atheromatous plaque. No aneurysm or stenosis. Proximal Outflow: Bilateral common femoral and visualized portions of the superficial and profunda femoral arteries are patent without evidence of aneurysm, dissection, vasculitis or significant stenosis. Veins: Patent hepatic veins, portal vein, SM V. no portal venous gas. Some of the mesenteric branches from small-bowel are incompletely opacified probably secondary to low flow due to occluded SMA inflow. Iliac venous system, IVC, and renal veins unremarkable. Review of the MIP images confirms the above findings. NON-VASCULAR Lower chest: Coronary calcifications. No pleural or pericardial effusion. Visualized lung bases clear. Hepatobiliary: No focal liver abnormality is seen. Status post cholecystectomy. No biliary dilatation. Pancreas: Unremarkable. No pancreatic ductal dilatation or surrounding inflammatory changes. Spleen: Normal in size without focal abnormality. Adrenals/Urinary Tract: Adrenal glands are unremarkable. Kidneys are normal, without renal calculi, focal lesion, or hydronephrosis. Bladder is unremarkable. Stomach/Bowel: Stomach is partially distended by ingested material, unremarkable. Small bowel is nondilated, with 2 loops demonstrating circumferential persistent wall thickening in the anterior mid abdomen (Im109 and 116,Se5 and 12) . No dilatation or pneumatosis.  The colon is incompletely distended by gas and fecal material, unremarkable. Lymphatic: No abdominal or pelvic adenopathy. Reproductive: Prostate enlargement with central coarse calcifications. Other: No ascites.  No free air. Musculoskeletal: Small paraumbilical hernia containing only mesenteric fat. Focal skin lesion left  anterior pelvic body wall with regional skin thickening and subcutaneous inflammatory/edematous change. Thoracolumbar fusion hardware extending T10 to across the sacroiliac joints bilaterally, appearing intact without surrounding lucency. No acute finding. IMPRESSION: 1. 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 Urgent vascular consultation recommended. 2. Two loops of persistent circumferential small bowel wall thickening in the anterior mid abdomen, without evidence of obstruction or pneumatosis. Critical Value/emergent results were called by telephone at the time of interpretation on 05/13/2022 at 2:24 pm to provider [facility] , who verbally acknowledged these results. Electronically Signed   By: Lucrezia Europe M.D.   On: 06/05/2022 14:26   CT ABDOMEN PELVIS W CONTRAST  Result Date: 06/04/2022 CLINICAL DATA:  Acute nonlocalized abdominal pain EXAM: CT ABDOMEN AND PELVIS WITH CONTRAST TECHNIQUE: Multidetector CT imaging of the abdomen and pelvis was performed using the standard protocol following bolus administration of intravenous contrast. RADIATION DOSE REDUCTION: This exam was performed according to the departmental dose-optimization program which includes automated exposure control, adjustment of the mA and/or kV according to patient size and/or use of iterative reconstruction technique. CONTRAST:  134m OMNIPAQUE IOHEXOL 300 MG/ML  SOLN COMPARISON:  CT December 22, 2019. FINDINGS: Lower chest: No acute abnormality. Hepatobiliary: No suspicious hepatic lesion. Gallbladder surgically absent. No biliary ductal dilation. Pancreas: No pancreatic ductal dilation or evidence of acute inflammation. Spleen: No splenomegaly or focal splenic lesion. Adrenals/Urinary Tract: Bilateral adrenal glands appear normal. No hydronephrosis. Symmetric excretion of contrast into the collecting systems bilaterally.  Probable radiopaque contrast material layering in the urinary bladder. Stomach/Bowel: Stomach is unremarkable for degree of distension. Duodenal diverticulum. No pathologic dilation of small or large bowel. The appendix is surgically absent. Scattered left-sided colonic diverticulosis without evidence of acute diverticulitis. Vascular/Lymphatic: Aortic atherosclerosis. No pathologically enlarged abdominal or pelvic lymph nodes. Reproductive: Dystrophic calcifications in the prostate gland. Other: Small fat containing supraumbilical ventral hernia. Musculoskeletal: Posterior fusion hardware extending from T10 through the sacrum is well as through the bilateral sacroiliac joints. No acute osseous abnormality. IMPRESSION: 1. No acute abnormality in the abdomen or pelvis. 2. Scattered left-sided colonic diverticulosis without evidence of acute diverticulitis. 3. Small fat containing supraumbilical ventral hernia. 4.  Aortic Atherosclerosis (ICD10-I70.0). Electronically Signed   By: JDahlia BailiffM.D.   On: 05/16/2022 09:38   DG Chest Portable 1 View  Result Date: 05/29/2022 CLINICAL DATA:  Generalized abdominal pain. EXAM: PORTABLE CHEST 1 VIEW COMPARISON:  07/29/2020 FINDINGS: Stable elevation of the right diaphragm. No focal consolidation. No pleural effusion or pneumothorax. Heart and mediastinal contours are unremarkable. No acute osseous abnormality. IMPRESSION: No active disease. Electronically Signed   By: HKathreen DevoidM.D.   On: 06/10/2022 09:19      Assessment/Plan 1. Acute mesenteric ischemia with SMA embolus.  The patient has clear symptoms referable to the ischemia with severe pain and elevated lactate.  Patient needs to be manually heparinized and we will take out angiography and try to debulk the thrombus and open the SMA.  General surgery has been consulted but it current does not need immediate laparotomy and will hopefully we can avoid bowel gangrene with prompt revascularization.  This is a  critical and life involving situational  2.  Diabetes. blood glucose control important in reducing the progression of atherosclerotic disease. Also, involved in wound healing. On appropriate medications. 3. Hypertension. blood pressure control important in reducing the progression of atherosclerotic disease. On appropriate oral medications.    [name], MD  05/25/2022 2:50 PM    This Murphy was created with Dragon medical transcription system.  Any error is purely unintentional

## 2022-05-23 NOTE — ED Notes (Signed)
Pt moaning in pain, rolling in bed and whimpering. Brought in wheelchair from triage. ED tech drawing labs now. Pt has EMS IV.

## 2022-05-23 NOTE — ED Notes (Signed)
Called lab per EDP request for missing labs. Lab says they have not received. Looked in tube station, not there. Calling back to have them look for bloodwork that was sent before pt went to xray.

## 2022-05-23 NOTE — ED Notes (Signed)
Called lab back. They looked everywhere and cannot find missing tubes. Labs will have to be redrawn.

## 2022-05-23 NOTE — Progress Notes (Signed)
       CROSS COVER NOTE  NAME: Howard Murphy MRN: [id number] DOB : 06/27/1955 ATTENDING PHYSICIAN: Ivor Costa, MD    Date of Service   05/17/2022   HPI/Events of Note   RN reporting unrelieved pain, no pain meds given since 2106. Morphine ordered q1H PRN. RN requesting PCA due to acuity of her assignment.  Interventions   Assessment/Plan: Fentanyl 46mg Narcan X        To reach the provider On-Call:   7AM- 7PM see care teams to locate the attending and reach out to them via [email or web address] 7PM-7AM contact night-coverage If you still have difficulty reaching the appropriate provider, please page the [facility](Director on Call) for [facility] on amion for assistance  This document was prepared using DSet designersoftware and may include unintentional dictation errors.  KNeomia GlassDNP, MBA, FNP-BC Nurse Practitioner [facility]ager ([phone]

## 2022-05-23 NOTE — Consult Note (Signed)
[facility] for heparin infusion Indication:  Superior mesenteric artery thrombosis  Allergies  Allergen Reactions   Adhesive [Tape] Other (See Comments)    Honeycomb dressing, blisters    Patient Measurements: Height: '5\' 7"'$  (170.2 cm) (from 3/23 visit) Weight: 80.3 kg (177 lb) IBW/kg (Calculated) : 66.1 Heparin Dosing Weight: 80.3 kg  Vital Signs: Temp: 97.6 F (36.4 C) (12/13 1219) Temp Source: Oral (12/13 1219) BP: 184/86 (12/13 1219) Pulse Rate: 104 (12/13 1219)  Labs: Recent Labs    05/20/2022 0941  HGB 13.6  HCT 41.9  PLT 350  CREATININE 1.00    Estimated Creatinine Clearance: 73.8 mL/min (by C-G formula based on SCr of 1 mg/dL).   Medical History: Past Medical History:  Diagnosis Date   Arthritis    Osteo vs rheumatoid ?   Back ache 05/05/2013   Diabetes mellitus without complication (HCC)    GERD (gastroesophageal reflux disease)    Headache    MIGRAINES   History of kidney stones    Hyperlipemia    Hypertension    Kidney stones    Kidney stones    Nephrolithiasis    Shortness of breath dyspnea     Medications:  No home anticoagulation per pharmacist review  Assessment: 66yo male presented to ED with severe abdominal pain.  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  Goal of Therapy:  Heparin level 0.3-0.7 units/ml Monitor platelets by anticoagulation protocol: Yes   Plan:  Give 5000 units bolus x 1 Start heparin infusion at 1300 units/hr Check anti-Xa level in 6 hours and daily while on heparin Continue to monitor H&H and platelets  Lorin Picket, PharmD 05/27/2022,2:47 PM

## 2022-05-23 NOTE — ED Provider Notes (Addendum)
[facility] Provider Note    Event Date/Time   First MD Initiated Contact with Patient 05/15/2022 [phone]     (approximate)   History   Abdominal Pain   HPI  Howard Murphy is a 66 y.o. male past medical history of hypertension hyperlipidemia kidney stones diabetes who presents because of abdominal pain.  Symptoms started acutely around 5 AM today.  Patient [name] is generalized lower abdominal pain that is constant.  Did have BM at the onset of pain that was normal has had several episodes of emesis.  Denies urinary symptoms blood in his urine no history of similar pain.   Patient has had history of cholecystectomy and appendectomy.    Past Medical History:  Diagnosis Date   Arthritis    Osteo vs rheumatoid ?   Back ache 05/05/2013   Diabetes mellitus without complication (HCC)    GERD (gastroesophageal reflux disease)    Headache    MIGRAINES   History of kidney stones    Hyperlipemia    Hypertension    Kidney stones    Kidney stones    Nephrolithiasis    Shortness of breath dyspnea     Patient Active Problem List   Diagnosis Date Noted   Wound infection following a procedure, other surgical site, sequela 11/23/2020   Status post lumbar spinal fusion (09/14/2020) 11/23/2020   Chronic use of opiate for therapeutic purpose 11/22/2020   Lumbar adjacent segment disease with spondylolisthesis 38/25/0539   Uncomplicated opioid dependence ([place]) 02/20/2020   DDD (degenerative disc disease), lumbar 02/03/2020   Grade 1 Retrolisthesis of L1/L2 01/05/2020   Lumbar disc extrusion with 7 mm caudal migration (L1-2) 01/05/2020   Lumbosacral radiculopathy at L1 (Right) 01/05/2020   Groin pain (Right) 01/05/2020   Neural foraminal stenosis of lumbar spine (L1-2) (Right) 01/05/2020   DDD (degenerative disc disease), lumbosacral 01/05/2020   Acute exacerbation of chronic low back pain 12/30/2019   Pharmacologic therapy 11/25/2019   Disorder of skeletal system  11/25/2019   Problems influencing health status 11/25/2019   Osteoarthritis involving multiple joints 02/23/2019   Chronic musculoskeletal pain 02/23/2019   Insomnia secondary to chronic pain 02/23/2019   Neurogenic claudication 04/02/2018   Chronic bilateral low back pain with bilateral sciatica 03/05/2018   Spondylosis without myelopathy or radiculopathy, lumbosacral region 01/14/2018   Other specified dorsopathies, sacral and sacrococcygeal region 12/26/2017   Acute appendicitis 12/18/2017   Medication monitoring encounter 03/07/2017   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) 02/28/2017   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   Spondylolisthesis at L3-L4 level 08/03/2016   Chronic pain syndrome 07/09/2016   Encounter for medication monitoring 02/24/2016   Cervical spondylosis 12/17/2015   Cervical facet arthropathy 12/17/2015   Chronic neck pain (Bilateral) (R>L) 12/17/2015   Cervical facet syndrome (Bilateral) (R>L) 12/17/2015   Chronic upper extremity pain (Bilateral) (R>L) 12/17/2015   Cervical foraminal stenosis 12/17/2015    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, C5-6) 12/17/2015   Arthropathy of shoulder (Right) 12/17/2015   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) 12/17/2015   Lumbar foraminal stenosis  (Left: L4-5) (Right: L1-2) 12/17/2015   Grade 1 Anterolisthesis (2 mm) of L4 over L5. 12/17/2015   Lumbar facet (L4-5) synovial cyst (6 mm) (Left) 12/17/2015   Chronic sacroiliac joint pain (Bilateral) (L>R) 12/08/2015   Neurogenic pain 12/08/2015   Abnormal MRI, lumbar spine (12/23/2019) 11/21/2015   Radicular pain of shoulder (Right) 10/03/2015   Carpal tunnel syndrome (Bilateral) 10/03/2015   Chronic cervical  radicular pain (Right) 10/03/2015   Chronic low back pain (1ry area of Pain) (Bilateral) (L>R) w/o sciatica 09/06/2015   Opiate use (60 MME/Day) 09/05/2015   Long term prescription opiate use 09/05/2015   Long term current use of opiate  analgesic 09/05/2015   Spondylosis of lumbar spine 09/05/2015   Lumbar facet joint syndrome ([place]) 09/05/2015   Chronic hip pain (Left) 09/05/2015   Diabetic peripheral neuropathy ([place]) 09/05/2015   Chronic lumbar radicular pain (S1 Dermatome) (Bilateral) (L>R) 09/05/2015   Hypokalemia 09/05/2015   Chronic shoulder pain (2ry area of Pain) (Bilateral) (L>R) 09/05/2015   Diabetes ([place]) 09/05/2015   Chronic foot pain (3ry area of Pain) (Bilateral) (L>R) 09/05/2015   Chronic hand pain (4th area of Pain) (Bilateral) (R>L) 09/05/2015   Lumbar facet hypertrophy 09/05/2015   Encounter for therapeutic drug level monitoring 09/05/2015   Low testosterone 09/05/2015   Occipital pain (Right) 09/05/2015   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 05/29/2013    Class: History of   Benign fibroma of prostate 05/05/2013   GERD (gastroesophageal reflux disease) 05/05/2013   Benign prostatic hyperplasia 05/05/2013   Essential hypertension 05/08/2011   Hyperlipidemia 05/08/2011     Physical Exam  Triage Vital Signs: ED Triage Vitals  Enc Vitals Group     BP 06/06/2022 0834 (!) 153/81     Pulse Rate 06/06/2022 0832 98     Resp --      Temp 05/14/2022 0832 (!) 97.5 F (36.4 C)     Temp Source 06/03/2022 0832 Oral     SpO2 06/03/2022 0832 90 %     Weight --      Height --      Head Circumference --      Peak Flow --      Pain Score 05/19/2022 0833 10     Pain Loc --      Pain Edu? --      Excl. in [place]? --     Most recent vital signs: Vitals:   05/30/2022 0834 06/06/2022 1219  BP: (!) 153/81 (!) 184/86  Pulse:   (!) 104  Resp:  (!) 22  Temp:  97.6 F (36.4 C)  SpO2:  96%     General: Awake, patient is writhing, uncomfortable. CV:  Good peripheral perfusion.  Resp:  Normal effort.  Abd:  Abdomen is diffusely tender with voluntary guarding throughout, there is a umbilical hernia that reducible, soft without overlying skin changes  Neuro:             Awake, Alert, Oriented x 3  Other:     ED Results / Procedures / Treatments  Labs (all labs ordered are listed, but only abnormal results are displayed) Labs Reviewed  COMPREHENSIVE METABOLIC PANEL - Abnormal; Notable for the following components:      Result Value   Potassium 2.7 (*)    Glucose, Bld 374 (*)    All other components within normal limits  URINALYSIS, ROUTINE W REFLEX MICROSCOPIC - Abnormal; Notable for the following components:   Color, Urine YELLOW (*)    APPearance CLEAR (*)  Specific Gravity, Urine >1.046 (*)    Glucose, UA >=500 (*)    Ketones, ur 5 (*)    All other components within normal limits  CBC WITH DIFFERENTIAL/PLATELET - Abnormal; Notable for the following components:   WBC 18.7 (*)    Neutro Abs 15.5 (*)    Abs Immature Granulocytes 0.20 (*)    All other components within normal limits  MAGNESIUM - Abnormal; Notable for the following components:   Magnesium 1.3 (*)    All other components within normal limits  LACTIC ACID, PLASMA - Abnormal; Notable for the following components:   Lactic Acid, Venous >9.0 (*)    All other components within normal limits  LIPASE, BLOOD  LACTIC ACID, PLASMA     EKG  EKG interpretation performed by myself: sinus ach, nml axis, nml intervals, no acute ischemic changes   RADIOLOGY I reviewed and interpreted the CXR which does not show any acute cardiopulmonary process    PROCEDURES:  Critical Care performed: No  .Critical Care  Performed by: Rada Hay, MD Authorized by: Rada Hay, MD   Critical care provider statement:    Critical care  time (minutes):  120   Critical care was time spent personally by me on the following activities:  Development of treatment plan with patient or surrogate, discussions with consultants, evaluation of patient's response to treatment, examination of patient, ordering and review of laboratory studies, ordering and review of radiographic studies, ordering and performing treatments and interventions, pulse oximetry, re-evaluation of patient's condition and review of old charts   The patient is on the cardiac monitor to evaluate for evidence of arrhythmia and/or significant heart rate changes.   MEDICATIONS ORDERED IN ED: Medications  magnesium sulfate IVPB 2 g 50 mL (2 g Intravenous New Bag/Given 05/18/2022 1421)  piperacillin-tazobactam (ZOSYN) IVPB 3.375 g (3.375 g Intravenous New Bag/Given 05/27/2022 1422)  HYDROmorphone (DILAUDID) injection 1 mg (1 mg Intravenous Given 06/04/2022 0847)  ondansetron (ZOFRAN) injection 4 mg (4 mg Intravenous Given 05/22/2022 0847)  iohexol (OMNIPAQUE) 300 MG/ML solution 100 mL (100 mLs Intravenous Contrast Given 06/05/2022 0925)  HYDROmorphone (DILAUDID) injection 1 mg (1 mg Intravenous Given 05/17/2022 0934)  ketamine 50 mg in normal saline 5 mL (10 mg/mL) syringe (24 mg Intravenous Given 05/22/2022 1156)  droperidol (INAPSINE) 2.5 MG/ML injection 2.5 mg (2.5 mg Intravenous Given 06/08/2022 1055)  lactated ringers bolus 1,000 mL (0 mLs Intravenous Stopped 06/04/2022 1251)  potassium chloride 10 mEq in 100 mL IVPB (0 mEq Intravenous Stopped 06/08/2022 1412)  lactated ringers bolus 1,000 mL (1,000 mLs Intravenous New Bag/Given 06/06/2022 1421)  HYDROmorphone (DILAUDID) injection 1 mg (1 mg Intravenous Given 05/25/2022 1334)  iohexol (OMNIPAQUE) 350 MG/ML injection 75 mL (75 mLs Intravenous Contrast Given 06/03/2022 1355)     IMPRESSION / MDM / ASSESSMENT AND PLAN / ED COURSE  I reviewed the triage vital signs and the nursing notes.                              Patient's presentation  is most consistent with acute presentation with potential threat to life or bodily function.  Differential diagnosis includes, but is not limited to, SBO, perforated viscus, volvulus, incarcerated hernia, kidney stone, AAA  The patient is a 66 year old male presents with acute onset of abdominal pain about 4 hours ago.  Pain is constant in the lower abdomen associate with nausea and vomiting.  On arrival to the ED patient  is in quite a bit of distress he is writhing around appears to have difficulty sitting still.  Vitals are notable for hypertension otherwise reassuring.  Patient's abdomen is diffusely tender with voluntary guarding.  Plan to obtain upright chest x-ray to evaluate for free air and CT of abdomen pelvis.  Will treat pain with IV Dilaudid and nausea with Zofran.    Upright chest x-ray shows no free air.  CT abdomen pelvis is essentially negative.  Patient still quite uncomfortable on repeat assessment.  He was received droperidol multiple doses of IV opiates and I even administered pain dose ketamine.  Still very uncomfortable.  Patient says pain is rating to the back.  Given we have no source of pain I did check lactate to screen for mesenteric ischemia although would expect to see some abnormal bowel wall thickening on CT but lactate is over 9.  This is quite concerning for mesenteric ischemia.  I thus ordered a CT angio and called radiology to get him over stat.  Will give empiric Zosyn.  I received a call by radiology that patient has a large SMA embolus.  Apparently the rest of his bowel overall looks okay.  I called Dr. Christian Mate with general surgery he suggest calling vascular and have a page out to Dr. Lucky Cowboy.  Informed patient and family of the results.   FINAL CLINICAL IMPRESSION(S) / ED DIAGNOSES   Final diagnoses:  Acute mesenteric ischemia ([place])     Rx / DC Orders   ED Discharge Orders     None        Note:  This document was prepared using Dragon voice  recognition software and may include unintentional dictation errors.   Rada Hay, MD 05/18/2022 1422    Rada Hay, MD 06/05/2022 [phone]

## 2022-05-23 NOTE — Progress Notes (Signed)
   05/12/2022 2054  Vitals  Temp 98.2 F (36.8 C)  Temp Source Oral  BP (!) 193/104  MAP (mmHg) 122  BP Location Left Arm  BP Method Automatic  Patient Position (if appropriate) Lying  Pulse Rate (!) 125  Pulse Rate Source Dinamap  ECG Heart Rate (!) 127  Resp (!) 24  Level of Consciousness  Level of Consciousness Alert  MEWS COLOR  MEWS Score Color Yellow  Oxygen Therapy  SpO2 94 %  O2 Device Room Air  Pain Assessment  Pain Scale 0-10  Pain Score 10  Faces Pain Scale 10  Pain Type Surgical pain  Pain Location Abdomen  Pain Orientation Anterior;Lower  Pain Frequency Constant  Pain Onset On-going  Patients Stated Pain Goal 0  Pain Intervention(s) Medication (See eMAR)  MEWS Score  MEWS Temp 0  MEWS Systolic 0  MEWS Pulse 2  MEWS RR 1  MEWS LOC 0  MEWS Score 3  Provider Notification  Provider Name/Title dew  Date Provider Notified 06/04/2022  Time Provider Notified 2125  Method of Notification Page;Call  Notification Reason Other (Comment) (request pain medication)  Provider response No new orders (to call floor coverage if pain continues)  Date of Provider Response 06/07/2022  Time of Provider Response 2126

## 2022-05-23 NOTE — H&P (Addendum)
History and Physical    Howard Murphy WNI:[id number] DOB: 24-Oct-1955 DOA: 05/12/2022  Referring MD/NP/PA:   PCP: [facility]   Patient coming from:  The patient is coming from home.  At baseline, pt is independent for most of ADL.        Chief Complaint: Abdominal pain  HPI: Howard Murphy is a 66 y.o. male with medical history significant of hypertension, hyperlipidemia, diabetes mellitus, kidney stone, migraine, chronic pain syndrome, who presents with abdominal pain.  Patient states that his abdominal pain started in the early morning at about 5 AM, which is located in the middle abdomen, constant, severe, sharp, 10 out of 10 in severity, radiating to the back.  Associated with nonbilious nonbloody vomiting, multiple times.  No diarrhea, denies fever or chills.  Patient has chronic mild shortness breath which has not changed.  Denies cough, chest pain, symptoms of UTI.  Patient is very uncomfortable and restless due to pain during the interview.  Data reviewed independently and ED Course: pt was found to have lactic acid> 9.0, WBC 18.7, negative urinalysis, liver function normal, lipase 43, GFR> 60, potassium 2.7, magnesium 1.3, phosph 2.3. Temperature normal, blood pressure 153/81, 184/86, heart rate 104, RR 24, oxygen saturation 96% on room air.  Patient is admitted to PCU as inpatient.  Dr. Lucky Cowboy of [facility] is urgently consulted by EDP. Also consulted Dr. Christian Mate of surgery.  CTA of abd/pelvis: 1. 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 Urgent vascular consultation recommended. 2. Two loops of persistent circumferential small bowel wall thickening in the anterior mid abdomen, without evidence of obstruction or pneumatosis.  CT-abd/pelvis: 1. No acute abnormality in the abdomen or pelvis. 2. Scattered left-sided colonic diverticulosis without evidence  of acute diverticulitis. 3. Small fat containing supraumbilical ventral hernia. 4.  Aortic Atherosclerosis (ICD10-I70.0).   EKG: I have personally reviewed.  Sinus rhythm, QTc 466, poor R wave progression, possible left atrial enlargement   Review of Systems:   General: no fevers, chills, no body weight gain, has fatigue HEENT: no blurry vision, hearing changes or sore throat Respiratory: no dyspnea, coughing, wheezing CV: no chest pain, no palpitations GI: has nausea, vomiting, abdominal pain, no diarrhea, constipation GU: no dysuria, burning on urination, increased urinary frequency, hematuria  Ext: no leg edema Neuro: no unilateral weakness, numbness, or tingling, no vision change or hearing loss Skin: no rash, no skin tear. MSK: No muscle spasm, no deformity, no limitation of range of movement in spin Heme: No easy bruising.  Travel history: No recent long distant travel.   Allergy:  Allergies  Allergen Reactions   Adhesive [Tape] Other (See Comments)    Honeycomb dressing, blisters    Past Medical History:  Diagnosis Date   Arthritis    Osteo vs rheumatoid ?   Back ache 05/05/2013   Diabetes mellitus without complication (HCC)    GERD (gastroesophageal reflux disease)    Headache    MIGRAINES   History of kidney stones    Hyperlipemia    Hypertension    Kidney stones    Kidney stones    Nephrolithiasis    Shortness of breath dyspnea     Past Surgical History:  Procedure Laterality Date   APPENDECTOMY     BACK SURGERY  2015   x3   BACK SURGERY  08/09/2016   BACK SURGERY Bilateral 2019   CHOLECYSTECTOMY  2000   COLONOSCOPY WITH  PROPOFOL N/A 04/11/2015   Procedure: COLONOSCOPY WITH PROPOFOL;  Surgeon: Manya Silvas, MD;  Location: [facility] ENDOSCOPY;  Service: Endoscopy;  Laterality: N/A;   CYSTOSCOPY WITH URETEROSCOPY AND STENT PLACEMENT Right 08/27/2017   Procedure: CYSTOSCOPY WITH URETEROSCOPY AND STENT PLACEMENT;  Surgeon: Abbie Sons, MD;   Location: [facility] ORS;  Service: Urology;  Laterality: Right;   ESOPHAGOGASTRODUODENOSCOPY (EGD) WITH PROPOFOL N/A 04/11/2015   Procedure: ESOPHAGOGASTRODUODENOSCOPY (EGD) WITH PROPOFOL;  Surgeon: Manya Silvas, MD;  Location: [facility] ENDOSCOPY;  Service: Endoscopy;  Laterality: N/A;   INCISION AND DRAINAGE N/A 10/22/2020   Procedure: INCISION AND DRAINAGE of POSTERIOR LUMBAR;  Surgeon: Meade Maw, MD;  Location: [facility] ORS;  Service: Neurosurgery;  Laterality: N/A;   LAPAROSCOPIC APPENDECTOMY N/A 12/18/2017   Procedure: APPENDECTOMY LAPAROSCOPIC;  Surgeon: Florene Glen, MD;  Location: [facility] ORS;  Service: General;  Laterality: N/A;   POSTERIOR LUMBAR FUSION 4 LEVEL N/A 09/14/2020   Procedure: OPEN T10-PELVIS POSTERIOR SPINAL FUSION (HARDWARE ALREADY PRESENT AT L2-S2); L1-2 POSTERIOR SPINAL DECOMPRESSION;  Surgeon: Meade Maw, MD;  Location: [facility] ORS;  Service: Neurosurgery;  Laterality: N/A;   SHOULDER SURGERY Right 2016   X2   SHOULDER SURGERY Left 06/2015    Social History:  reports that he has never smoked. He quit smokeless tobacco use about 9 years ago.  His smokeless tobacco use included chew. He reports that he does not drink alcohol and does not use drugs.  Family History:  Family History  Problem Relation Age of Onset   Cancer Mother        Breast CA   Stroke Father    Heart disease Father    Cancer Sister    Cancer Maternal Grandmother    Cancer Sister    Bladder Cancer Neg Hx    Prostate cancer Neg Hx    Kidney cancer Neg Hx      Prior to Admission medications   Medication Sig Start Date End Date Taking? Authorizing Provider  celecoxib (CELEBREX) 200 MG capsule Take 200 mg by mouth 2 (two) times daily. 05/22/22  Yes [provider]  chlorthalidone (HYGROTON) 50 MG tablet Take 50 mg by mouth daily. 04/11/22  Yes [provider]  gabapentin (NEURONTIN) 800 MG tablet Take 800 mg by mouth at bedtime. 05/17/22  Yes [provider]   lisinopril (ZESTRIL) 20 MG tablet Take 20 mg by mouth daily. 04/10/22  Yes [provider]  naloxone Karma Greaser) nasal spray 4 mg/0.1 mL SMARTSIG:Both Nares 04/24/22  Yes [provider]  naproxen (NAPROSYN) 500 MG tablet SMARTSIG:1 Tablet(s) By Mouth Every 12 Hours 04/18/22  Yes [provider]  ondansetron (ZOFRAN-ODT) 8 MG disintegrating tablet Take 8 mg by mouth every 8 (eight) hours as needed. 05/11/22  Yes [provider]  oxyCODONE (ROXICODONE) 15 MG immediate release tablet Take 15 mg by mouth every 4 (four) hours as needed. 05/22/22  Yes [provider]  testosterone cypionate (DEPOTESTOSTERONE CYPIONATE) 200 MG/ML injection Inject 200 mg into the muscle every 7 (seven) days. 10/07/18  Yes [provider]  acetaminophen (TYLENOL) 500 MG tablet Take 2 tablets (1,000 mg total) by mouth every 6 (six) hours. 09/17/20   Meade Maw, MD  atorvastatin (LIPITOR) 40 MG tablet Take 1 tablet (40 mg total) by mouth at bedtime. Patient not taking: Reported on 05/12/2022 03/08/17   Arnetha Courser, MD  cyclobenzaprine (FLEXERIL) 5 MG tablet Take by mouth. 09/27/20   [provider]  etodolac (LODINE) 400 MG tablet Take 1  tablet (400 mg total) by mouth 2 (two) times daily. Patient not taking: Reported on 05/20/2022 09/02/21   Johnn Hai, PA-C  HYDROcodone-acetaminophen (NORCO/VICODIN) 5-325 MG tablet Take 1 tablet by mouth every 6 (six) hours as needed for moderate pain. 09/02/21 09/02/22  Johnn Hai, PA-C  JANUVIA 100 MG tablet Take 100 mg by mouth daily. 05/26/20   [provider]  JARDIANCE 25 MG TABS tablet Take 25 mg by mouth daily. 02/06/20   [provider]  meclizine (ANTIVERT) 25 MG tablet Take 25 mg by mouth every 6 (six) hours as needed for dizziness.    [provider]  methocarbamol (ROBAXIN) 500 MG tablet 1-2 tablets every 6 hours prn muscles spasms Patient not taking: Reported on 05/29/2022  09/02/21   Johnn Hai, PA-C  omeprazole (PRILOSEC OTC) 20 MG tablet Take 1 tablet (20 mg total) by mouth daily for 20 days. 09/27/20 10/17/20  Domenic Polite, MD  oxyCODONE-acetaminophen (PERCOCET) 10-325 MG tablet Take 1 tablet by mouth every 8 (eight) hours as needed for pain. Must last 30 days. 08/01/21 08/31/21  Milinda Pointer, MD  senna-docusate (SENOKOT-S) 8.6-50 MG tablet Take 1 tablet by mouth 2 (two) times daily. 09/27/20   Domenic Polite, MD  tamsulosin (FLOMAX) 0.4 MG CAPS capsule Take 1 capsule (0.4 mg total) by mouth daily. 11/04/18   Billey Co, MD  zolpidem (AMBIEN) 10 MG tablet Take 10 mg by mouth at bedtime.    [provider]    Physical Exam: Vitals:   05/31/2022 1646 05/12/2022 1651 05/31/2022 1704 05/22/2022 1715  BP: (!) 156/82 (!) 174/119 (!) 173/109 (!) 171/100  Pulse: (!) 139 (!) 145 (!) 129 (!) 128  Resp: (!) 27 (!) 32 (!) 30 (!) 24  Temp:      TempSrc:      SpO2: 100% 100% 94% 95%  Weight:      Height:       General: Not in acute distress HEENT:       Eyes: PERRL, EOMI, no scleral icterus.       ENT: No discharge from the ears and nose, no pharynx injection, no tonsillar enlargement.        Neck: No JVD, no bruit, no mass felt. Heme: No neck lymph node enlargement. Cardiac: S1/S2, RRR, No murmurs, No gallops or rubs. Respiratory: No rales, wheezing, rhonchi or rubs. GI: Soft, nondistended, has tenderness in cental abdomen, no rebound pain, no organomegaly, BS present. GU: No hematuria Ext: No pitting leg edema bilaterally. 1+DP/PT pulse bilaterally. Musculoskeletal: No joint deformities, No joint redness or warmth, no limitation of ROM in spin. Skin: No rashes.  Neuro: Alert, oriented X3, cranial nerves II-XII grossly intact, moves all extremities normally. Psych: Patient is not psychotic, no suicidal or hemocidal ideation.  Labs on Admission: I have personally reviewed following labs and imaging studies  CBC: Recent Labs  Lab  05/28/2022 0941  WBC 18.7*  NEUTROABS 15.5*  HGB 13.6  HCT 41.9  MCV 89.0  PLT 902   Basic Metabolic Panel: Recent Labs  Lab 05/22/2022 0940 05/20/2022 0941  NA  --  135  K  --  2.7*  CL  --  100  CO2  --  22  GLUCOSE  --  374*  BUN  --  18  CREATININE  --  1.00  CALCIUM  --  9.0  MG 1.3*  --   PHOS 2.3*  --    GFR: Estimated Creatinine Clearance: 73.8 mL/min (by C-G  formula based on SCr of 1 mg/dL). Liver Function Tests: Recent Labs  Lab 05/22/2022 0941  AST 33  ALT 22  ALKPHOS 53  BILITOT 0.8  PROT 6.9  ALBUMIN 3.8   Recent Labs  Lab 05/22/2022 0941  LIPASE 43   No results for input(s): "AMMONIA" in the last 168 hours. Coagulation Profile: Recent Labs  Lab 06/06/2022 1450  INR 1.2   Cardiac Enzymes: No results for input(s): "CKTOTAL", "CKMB", "CKMBINDEX", "TROPONINI" in the last 168 hours. BNP (last 3 results) No results for input(s): "PROBNP" in the last 8760 hours. HbA1C: No results for input(s): "HGBA1C" in the last 72 hours. CBG: Recent Labs  Lab 06/09/2022 1504 06/09/2022 1529  GLUCAP 340* 338*   Lipid Profile: No results for input(s): "CHOL", "HDL", "LDLCALC", "TRIG", "CHOLHDL", "LDLDIRECT" in the last 72 hours. Thyroid Function Tests: No results for input(s): "TSH", "T4TOTAL", "FREET4", "T3FREE", "THYROIDAB" in the last 72 hours. Anemia Panel: No results for input(s): "VITAMINB12", "FOLATE", "FERRITIN", "TIBC", "IRON", "RETICCTPCT" in the last 72 hours. Urine analysis:    Component Value Date/Time   COLORURINE YELLOW (A) 05/19/2022 0950   APPEARANCEUR CLEAR (A) 06/07/2022 0950   APPEARANCEUR Clear 08/15/2017 1347   LABSPEC >1.046 (H) 06/08/2022 0950   LABSPEC 1.015 03/23/2014 2003   PHURINE 6.0 06/01/2022 0950   GLUCOSEU >=500 (A) 05/17/2022 0950   GLUCOSEU 50 mg/dL 03/23/2014 2003   HGBUR NEGATIVE 05/16/2022 0950   BILIRUBINUR NEGATIVE 05/20/2022 0950   BILIRUBINUR Negative 08/15/2017 1347   BILIRUBINUR Negative 03/23/2014 2003   KETONESUR  5 (A) 05/22/2022 0950   PROTEINUR NEGATIVE 05/11/2022 0950   UROBILINOGEN 1.0 02/23/2015 2347   NITRITE NEGATIVE 05/19/2022 0950   LEUKOCYTESUR NEGATIVE 06/07/2022 0950   LEUKOCYTESUR Negative 03/23/2014 2003   Sepsis Labs: '@LABRCNTIP'$ (procalcitonin:4,lacticidven:4) )No results found for this or any previous visit (from the past 240 hour(s)).   Radiological Exams on Admission: PERIPHERAL VASCULAR CATHETERIZATION  Result Date: 05/12/2022 See surgical note for result.  CT Angio Abd/Pel w/ and/or w/o  Result Date: 05/18/2022 CLINICAL DATA:  Mesenteric ischemia, abdominal pain, elevated lactate EXAM: CTA ABDOMEN AND PELVIS WITHOUT AND WITH CONTRAST TECHNIQUE: Multidetector CT imaging of the abdomen and pelvis was performed using the standard protocol during bolus administration of intravenous contrast. Multiplanar reconstructed images and MIPs were obtained and reviewed to evaluate the vascular anatomy. RADIATION DOSE REDUCTION: This exam was performed according to the departmental dose-optimization program which includes automated exposure control, adjustment of the mA and/or kV according to patient size and/or use of iterative reconstruction technique. CONTRAST:  15m OMNIPAQUE IOHEXOL 350 MG/ML SOLN COMPARISON:  CT 12/22/2019 FINDINGS: VASCULAR Aorta: Minimal calcified aortic atheromatous plaque. No aneurysm, dissection, or stenosis. Celiac: Patent without evidence of aneurysm, dissection, vasculitis or significant stenosis. SMA: There is occlusive thrombus in the SMA extending throughout most of its length from just beyond the origin, with limited segmental areas of collateral reconstitution. The thrombus extends into multiple SMA branches. No definite lesion is identified in the native vessel, suggesting possible embolic origin. Renals: Single left renal artery, widely patent. Duplicated right renal arteries, superior dominant, both patent. IMA: Patent without evidence of aneurysm, dissection,  vasculitis or significant stenosis. Inflow: Minimal nonocclusive atheromatous plaque. No aneurysm or stenosis. Proximal Outflow: Bilateral common femoral and visualized portions of the superficial and profunda femoral arteries are patent without evidence of aneurysm, dissection, vasculitis or significant stenosis. Veins: Patent hepatic veins, portal vein, SM V. no portal venous gas. Some of the mesenteric branches from small-bowel are incompletely opacified  probably secondary to low flow due to occluded SMA inflow. Iliac venous system, IVC, and renal veins unremarkable. Review of the MIP images confirms the above findings. NON-VASCULAR Lower chest: Coronary calcifications. No pleural or pericardial effusion. Visualized lung bases clear. Hepatobiliary: No focal liver abnormality is seen. Status post cholecystectomy. No biliary dilatation. Pancreas: Unremarkable. No pancreatic ductal dilatation or surrounding inflammatory changes. Spleen: Normal in size without focal abnormality. Adrenals/Urinary Tract: Adrenal glands are unremarkable. Kidneys are normal, without renal calculi, focal lesion, or hydronephrosis. Bladder is unremarkable. Stomach/Bowel: Stomach is partially distended by ingested material, unremarkable. Small bowel is nondilated, with 2 loops demonstrating circumferential persistent wall thickening in the anterior mid abdomen (Im109 and 116,Se5 and 12) . No dilatation or pneumatosis. The colon is incompletely distended by gas and fecal material, unremarkable. Lymphatic: No abdominal or pelvic adenopathy. Reproductive: Prostate enlargement with central coarse calcifications. Other: No ascites.  No free air. Musculoskeletal: Small paraumbilical hernia containing only mesenteric fat. Focal skin lesion left anterior pelvic body wall with regional skin thickening and subcutaneous inflammatory/edematous change. Thoracolumbar fusion hardware extending T10 to across the sacroiliac joints bilaterally, appearing  intact without surrounding lucency. No acute finding. IMPRESSION: 1. 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 Urgent vascular consultation recommended. 2. Two loops of persistent circumferential small bowel wall thickening in the anterior mid abdomen, without evidence of obstruction or pneumatosis. Critical Value/emergent results were called by telephone at the time of interpretation on 05/27/2022 at 2:24 pm to provider [facility] , who verbally acknowledged these results. Electronically Signed   By: Lucrezia Europe M.D.   On: 06/03/2022 14:26   CT ABDOMEN PELVIS W CONTRAST  Result Date: 05/17/2022 CLINICAL DATA:  Acute nonlocalized abdominal pain EXAM: CT ABDOMEN AND PELVIS WITH CONTRAST TECHNIQUE: Multidetector CT imaging of the abdomen and pelvis was performed using the standard protocol following bolus administration of intravenous contrast. RADIATION DOSE REDUCTION: This exam was performed according to the departmental dose-optimization program which includes automated exposure control, adjustment of the mA and/or kV according to patient size and/or use of iterative reconstruction technique. CONTRAST:  164m OMNIPAQUE IOHEXOL 300 MG/ML  SOLN COMPARISON:  CT December 22, 2019. FINDINGS: Lower chest: No acute abnormality. Hepatobiliary: No suspicious hepatic lesion. Gallbladder surgically absent. No biliary ductal dilation. Pancreas: No pancreatic ductal dilation or evidence of acute inflammation. Spleen: No splenomegaly or focal splenic lesion. Adrenals/Urinary Tract: Bilateral adrenal glands appear normal. No hydronephrosis. Symmetric excretion of contrast into the collecting systems bilaterally. Probable radiopaque contrast material layering in the urinary bladder. Stomach/Bowel: Stomach is unremarkable for degree of distension. Duodenal diverticulum. No pathologic dilation of small or large bowel. The  appendix is surgically absent. Scattered left-sided colonic diverticulosis without evidence of acute diverticulitis. Vascular/Lymphatic: Aortic atherosclerosis. No pathologically enlarged abdominal or pelvic lymph nodes. Reproductive: Dystrophic calcifications in the prostate gland. Other: Small fat containing supraumbilical ventral hernia. Musculoskeletal: Posterior fusion hardware extending from T10 through the sacrum is well as through the bilateral sacroiliac joints. No acute osseous abnormality. IMPRESSION: 1. No acute abnormality in the abdomen or pelvis. 2. Scattered left-sided colonic diverticulosis without evidence of acute diverticulitis. 3. Small fat containing supraumbilical ventral hernia. 4.  Aortic Atherosclerosis (ICD10-I70.0). Electronically Signed   By: JDahlia BailiffM.D.   On: 06/07/2022 09:38   DG Chest Portable 1 View  Result Date: 05/14/2022 CLINICAL DATA:  Generalized abdominal pain. EXAM: PORTABLE CHEST 1 VIEW COMPARISON:  07/29/2020 FINDINGS: Stable elevation of  the right diaphragm. No focal consolidation. No pleural effusion or pneumothorax. Heart and mediastinal contours are unremarkable. No acute osseous abnormality. IMPRESSION: No active disease. Electronically Signed   By: Kathreen Devoid M.D.   On: 05/15/2022 09:19      Assessment/Plan Principal Problem:   Superior mesenteric artery thrombosis (HCC) Active Problems:   Acute mesenteric ischemia (HCC)   SIRS (systemic inflammatory response syndrome) (HCC)   Essential hypertension   Chronic pain syndrome   Hyperlipidemia   Elevated lactic acid level   Diabetes mellitus without complication (HCC)   Hypophosphatemia   Hypokalemia   Hypomagnesemia   Assessment and Plan:  Superior mesenteric artery thrombosis [facility]): CTA showed occlusive thrombus/embolus in the SMA extending throughout its length into multiple branches, with limited segmental areas of collateral reconstitution. Two loops of persistent circumferential  small bowel wall thickening in the anterior mid abdomen, without evidence of obstruction or pneumatosis. Pt is getting testosterone injection, which likely increased risk of thrombus formation. Dr. Lucky Cowboy of [facility] is urgently consulted by EDP.   -will admit to PCU as inpt -IV Heparin started in ED -prn dilaudid and oxycodone for pain -prn zofran -IVF: 2L of LR, then 125 cc/h of NS  SIRS (systemic inflammatory response syndrome) ([place]): WBC 18.7, heart rate 104, RR 24.  No fever.  Lactic acid >9.0.  No source of infection identified.  Chest x-ray negative.  Urinalysis negative.  Likely secondary to SMA thrombus formation. -Patient received 1 dose of Zosyn in ED --> will hold off of further antibiotics -Check procalcitonin level -Trend lactic acid level -IV fluid as above  Essential hypertension -Continue home lisinopril -Hold Hygroton since patient need IV fluid -IV hydralazine as needed  Chronic pain syndrome -Continue home as needed oxycodone 15 mg every 4 hours  Hyperlipidemia: Patient is currently not taking Lipitor -Follow-up with PCP  Elevated lactic acid level: Lactic acid> 9.0.  Likely due to bowel ischemia. -IV fluid as above -Trend lactic acid level  Diabetes mellitus without complication [facility]): Recent A1c 6.0, well-controlled.  Patient is taking Jardiance and Januvia at home -Sliding scale insulin  Hypokalemia, hypomagnesemia and hypophosphatemia: Potassium 2.7, magnesium 1.3, phosphorus 2.3 -Repleted all three     DVT ppx: on IV Heparin    Code Status: Full code  Family Communication:  Yes, patient's daughter and fianc   at bed side.   Disposition Plan:  Anticipate discharge back to previous environment  Consults called: Dr. Lucky Cowboy of [facility] and Dr. Christian Mate of surgery.  Admission status and Level of care: Progressive:   as inpt       Dispo: The patient is from: Home              Anticipated d/c is to: Home              Anticipated d/c date is: 2 days               Patient currently is not medically stable to d/c.    Severity of Illness:  The appropriate patient status for this patient is INPATIENT. Inpatient status is judged to be reasonable and necessary in order to provide the required intensity of service to ensure the patient's safety. The patient's presenting symptoms, physical exam findings, and initial radiographic and laboratory data in the context of their chronic comorbidities is felt to place them at high risk for further clinical deterioration. Furthermore, it is not anticipated that the patient will be medically stable for discharge from the hospital within  2 midnights of admission.   * I certify that at the point of admission it is my clinical judgment that the patient will require inpatient hospital care spanning beyond 2 midnights from the point of admission due to high intensity of service, high risk for further deterioration and high frequency of surveillance required.*       Date of Service 05/19/2022    Ivor Costa [facility]   If 7PM-7AM, please contact night-coverage [email or web address] 06/02/2022, 6:13 PM

## 2022-05-23 NOTE — ED Notes (Signed)
Pt back from CT

## 2022-05-23 NOTE — ED Triage Notes (Signed)
First Nurse note: Generalized ABD pain started this morning at 0500. 2 episodes of vomiting prior to EMS arrival.  151/67 94 HR  97% CO2:23  RR: 29   50 mcg of fentanyl  Pt took his own oxy at 0500.  18G R FA

## 2022-05-23 NOTE — Consult Note (Signed)
[facility] SURGICAL CONSULTATION NOTE (initial) - cpt: 99254   HISTORY OF PRESENT ILLNESS (HPI):  66 y.o. male presented to [facility] ED today for evaluation of 8+ hours of acute onset of diffuse abdominal pain. Patient reports no known provocation event, no relief in the emergency room.  Normal CT scan with elevated lactate and pain out of proportion to exam provoked CT angiogram which revealed clear SMA embolus.  Dr. Laurence Compton was consulted and underwent angiographic attempt to clear the SMA. He was able to get the proximal SMA cleaned out and open, but he had a lot of thrombus and embolization to distal branches of the SMA.  Therefore, he remains at high risk to have at least significant areas of patchy necrosis with the distal images of the SMA on angiogram.  Current plan is to continue anticoagulation for now and observe closely, as he is suspected he may ultimately need some areas of small bowel resection.   Surgery is consulted by ED physician Dr. Starleen Blue in this context for evaluation and management of thromboembolic event to SMA.  PAST MEDICAL HISTORY (PMH):  Past Medical History:  Diagnosis Date   Arthritis    Osteo vs rheumatoid ?   Back ache 05/05/2013   Diabetes mellitus without complication (HCC)    GERD (gastroesophageal reflux disease)    Headache    MIGRAINES   History of kidney stones    Hyperlipemia    Hypertension    Kidney stones    Kidney stones    Nephrolithiasis    Shortness of breath dyspnea      PAST SURGICAL HISTORY ([place]):  Past Surgical History:  Procedure Laterality Date   APPENDECTOMY     BACK SURGERY  2015   x3   BACK SURGERY  08/09/2016   BACK SURGERY Bilateral 2019   CHOLECYSTECTOMY  2000   COLONOSCOPY WITH PROPOFOL N/A 04/11/2015   Procedure: COLONOSCOPY WITH PROPOFOL;  Surgeon: Manya Silvas, MD;  Location: [place];  Service: Endoscopy;  Laterality: N/A;   CYSTOSCOPY WITH URETEROSCOPY AND STENT PLACEMENT Right 08/27/2017    Procedure: CYSTOSCOPY WITH URETEROSCOPY AND STENT PLACEMENT;  Surgeon: Abbie Sons, MD;  Location: [facility];  Service: Urology;  Laterality: Right;   ESOPHAGOGASTRODUODENOSCOPY (EGD) WITH PROPOFOL N/A 04/11/2015   Procedure: ESOPHAGOGASTRODUODENOSCOPY (EGD) WITH PROPOFOL;  Surgeon: Manya Silvas, MD;  Location: [facility] ENDOS[facility];  Service: Endoscopy;  Laterality: N/A;   INCISION AND DRAINAGE N/A 10/22/2020   Procedure: INCISION AND DRAINAGE of POSTERIOR LUMBAR;  Surgeon: Meade Maw, MD;  Location: [facility];  Service: Neurosurgery;  Laterality: N/A;   LAPAROSCOPIC APPENDECTOMY N/A 12/18/2017   Procedure: APPENDECTOMY LAPAROSCOPIC;  Surgeon: Florene Glen, MD;  Location: [facility];  Service: General;  Laterality: N/A;   POSTERIOR LUMBAR FUSION 4 LEVEL N/A 09/14/2020   Procedure: OPEN T10-PELVIS POSTERIOR SPINAL FUSION (HARDWARE ALREADY PRESENT AT L2-S2); L1-2 POSTERIOR SPINAL DECOMPRESSION;  Surgeon: Meade Maw, MD;  Location: [facility];  Service: Neurosurgery;  Laterality: N/A;   SHOULDER SURGERY Right 2016   X2   SHOULDER SURGERY Left 06/2015     MEDICATIONS:  Prior to Admission medications   Medication Sig Start Date End Date Taking? Authorizing Provider  acetaminophen (TYLENOL) 500 MG tablet Take 2 tablets (1,000 mg total) by mouth every 6 (six) hours. 09/17/20  Yes Meade Maw, MD  celecoxib (CELEBREX) 200 MG capsule Take 200 mg by mouth 2 (two) times daily. 05/22/22  Yes [provider]  chlorthalidone (HYGROTON) 50 MG tablet Take  50 mg by mouth daily. 04/11/22  Yes [provider]  gabapentin (NEURONTIN) 800 MG tablet Take 800 mg by mouth at bedtime. 05/17/22  Yes [provider]  JANUVIA 100 MG tablet Take 100 mg by mouth daily. 05/26/20  Yes [provider]  JARDIANCE 25 MG TABS tablet Take 25 mg by mouth daily. 02/06/20  Yes [provider]  lisinopril (ZESTRIL) 20 MG tablet Take 20 mg by mouth daily. 04/10/22  Yes  [provider]  naloxone Karma Greaser) nasal spray 4 mg/0.1 mL SMARTSIG:Both Nares 04/24/22  Yes [provider]  naproxen (NAPROSYN) 500 MG tablet SMARTSIG:1 Tablet(s) By Mouth Every 12 Hours 04/18/22  Yes [provider]  ondansetron (ZOFRAN-ODT) 8 MG disintegrating tablet Take 8 mg by mouth every 8 (eight) hours as needed. 05/11/22  Yes [provider]  oxyCODONE (ROXICODONE) 15 MG immediate release tablet Take 15 mg by mouth every 4 (four) hours as needed. 05/22/22  Yes [provider]  senna-docusate (SENOKOT-S) 8.6-50 MG tablet Take 1 tablet by mouth 2 (two) times daily. 09/27/20  Yes Domenic Polite, MD  tamsulosin (FLOMAX) 0.4 MG CAPS capsule Take 1 capsule (0.4 mg total) by mouth daily. 11/04/18  Yes Billey Co, MD  testosterone cypionate (DEPOTESTOSTERONE CYPIONATE) 200 MG/ML injection Inject 200 mg into the muscle every 7 (seven) days. 10/07/18  Yes [provider]  zolpidem (AMBIEN) 10 MG tablet Take 10 mg by mouth at bedtime.   Yes [provider]  atorvastatin (LIPITOR) 40 MG tablet Take 1 tablet (40 mg total) by mouth at bedtime. Patient not taking: Reported on 06/10/2022 03/08/17   Arnetha Courser, MD  cyclobenzaprine (FLEXERIL) 5 MG tablet Take by mouth. Patient not taking: Reported on 06/06/2022 09/27/20   [provider]  etodolac (LODINE) 400 MG tablet Take 1 tablet (400 mg total) by mouth 2 (two) times daily. Patient not taking: Reported on 05/11/2022 09/02/21   Johnn Hai, PA-C  HYDROcodone-acetaminophen (NORCO/VICODIN) 5-325 MG tablet Take 1 tablet by mouth every 6 (six) hours as needed for moderate pain. Patient not taking: Reported on 05/13/2022 09/02/21 09/02/22  Johnn Hai, PA-C  meclizine (ANTIVERT) 25 MG tablet Take 25 mg by mouth every 6 (six) hours as needed for dizziness. Patient not taking: Reported on 05/28/2022    [provider]  methocarbamol (ROBAXIN) 500 MG tablet 1-2  tablets every 6 hours prn muscles spasms Patient not taking: Reported on 05/17/2022 09/02/21   Johnn Hai, PA-C  omeprazole (PRILOSEC OTC) 20 MG tablet Take 1 tablet (20 mg total) by mouth daily for 20 days. 09/27/20 10/17/20  Domenic Polite, MD  oxyCODONE-acetaminophen (PERCOCET) 10-325 MG tablet Take 1 tablet by mouth every 8 (eight) hours as needed for pain. Must last 30 days. 08/01/21 08/31/21  Milinda Pointer, MD     ALLERGIES:  Allergies  Allergen Reactions   Adhesive [Tape] Other (See Comments)    Honeycomb dressing, blisters     SOCIAL HISTORY:  Social History   Socioeconomic History   Marital status: Single    Spouse name: Not on file   Number of children: Not on file   Years of education: Not on file   Highest education level: Not on file  Occupational History   Not on file  Tobacco Use   Smoking status: Never   Smokeless tobacco: Former    Types: Chew    Quit date: 01/18/2013  Vaping Use   Vaping Use: Never used  Substance and Sexual  Activity   Alcohol use: No   Drug use: No   Sexual activity: Yes  Other Topics Concern   Not on file  Social History Narrative   Not on file   Social Determinants of Health   Financial Resource Strain: Not on file  Food Insecurity: Not on file  Transportation Needs: Not on file  Physical Activity: Not on file  Stress: Not on file  Social Connections: Not on file  Intimate Partner Violence: Not on file     FAMILY HISTORY:  Family History  Problem Relation Age of Onset   Cancer Mother        Breast CA   Stroke Father    Heart disease Father    Cancer Sister    Cancer Maternal Grandmother    Cancer Sister    Bladder Cancer Neg Hx    Prostate cancer Neg Hx    Kidney cancer Neg Hx       REVIEW OF SYSTEMS:  ROS  VITAL SIGNS:  Temp:  [97.5 F (36.4 C)-97.6 F (36.4 C)] 97.6 F (36.4 C) (12/13 1219) Pulse Rate:  [0-145] 145 (12/13 1651) Resp:  [16-37] 32 (12/13 1651) BP: (138-199)/(60-119) 174/119 (12/13  1651) SpO2:  [90 %-100 %] 100 % (12/13 1651) Weight:  [80.3 kg] 80.3 kg (12/13 1052)     Height: '5\' 7"'$  (170.2 cm) (from 3/23 visit) Weight: 80.3 kg     INTAKE/OUTPUT:  No intake/output data recorded.  PHYSICAL EXAM:  Physical Exam Blood pressure (!) 174/119, pulse (!) 145, temperature 97.6 F (36.4 C), temperature source Oral, resp. rate (!) 32, height '5\' 7"'$  (1.702 m), weight 80.3 kg, SpO2 100 %. Last Weight  Most recent update: 05/22/2022 10:52 AM    Weight  80.3 kg (177 lb)             CONSTITUTIONAL: Well developed, and nourished, appropriately responsive and aware in distress.   EYES: Sclera non-icteric.   EARS, NOSE, MOUTH AND THROAT:  The oropharynx is clear. Oral mucosa is pink and moist.    Hearing is intact to voice.  NECK: Trachea is midline, and there is no jugular venous distension.  LYMPH NODES:  Lymph nodes in the neck are not enlarged. RESPIRATORY:  Lungs are clear, and breath sounds are equal bilaterally. Normal respiratory effort without pathologic use of accessory muscles. CARDIOVASCULAR: Heart is regular in rate and rhythm. GI: The abdomen is notable for umbilical hernia, reducible diffuse tenderness with voluntary guarding, without evidence of rebound or rigidity. Nondistended. There were no palpable masses.  MUSCULOSKELETAL:  Symmetrical muscle tone appreciated in all four extremities.    SKIN: Skin turgor is normal. No pathologic skin lesions appreciated.  NEUROLOGIC:  Motor and sensation appear grossly normal.  Cranial nerves are grossly without defect. PSYCH:  Alert and oriented to person, place and time. Affect is appropriate for situation.  Data Reviewed I have personally reviewed what is currently available of the patient's imaging, recent labs and medical records.    Labs:     Latest Ref Rng & Units 05/25/2022    9:41 AM 09/02/2021   10:34 AM 10/27/2020    5:22 AM  CBC  WBC 4.0 - 10.5 K/uL 18.7  7.5  9.2   Hemoglobin 13.0 - 17.0 g/dL 13.6  17.0   12.7   Hematocrit 39.0 - 52.0 % 41.9  52.7  39.4   Platelets 150 - 400 K/uL 350  238  238       Latest Ref Rng &  Units 06/06/2022    9:41 AM 09/02/2021   10:34 AM 10/27/2020    5:22 AM  CMP  Glucose 70 - 99 mg/dL 374  151  100   BUN 8 - 23 mg/dL '18  20  16   '$ Creatinine 0.61 - 1.24 mg/dL 1.00  0.90  0.82   Sodium 135 - 145 mmol/L 135  137  138   Potassium 3.5 - 5.1 mmol/L 2.7  3.3  4.1   Chloride 98 - 111 mmol/L 100  101  106   CO2 22 - 32 mmol/L '22  27  26   '$ Calcium 8.9 - 10.3 mg/dL 9.0  9.4  8.7   Total Protein 6.5 - 8.1 g/dL 6.9     Total Bilirubin 0.3 - 1.2 mg/dL 0.8     Alkaline Phos 38 - 126 U/L 53     AST 15 - 41 U/L 33     ALT 0 - 44 U/L 22        Imaging studies:   Last 24 hrs: PERIPHERAL VASCULAR CATHETERIZATION  Result Date: 05/11/2022 See surgical note for result.  CT Angio Abd/Pel w/ and/or w/o  Result Date: 06/10/2022 CLINICAL DATA:  Mesenteric ischemia, abdominal pain, elevated lactate EXAM: CTA ABDOMEN AND PELVIS WITHOUT AND WITH CONTRAST TECHNIQUE: Multidetector CT imaging of the abdomen and pelvis was performed using the standard protocol during bolus administration of intravenous contrast. Multiplanar reconstructed images and MIPs were obtained and reviewed to evaluate the vascular anatomy. RADIATION DOSE REDUCTION: This exam was performed according to the departmental dose-optimization program which includes automated exposure control, adjustment of the mA and/or kV according to patient size and/or use of iterative reconstruction technique. CONTRAST:  8m OMNIPAQUE IOHEXOL 350 MG/ML SOLN COMPARISON:  CT 12/22/2019 FINDINGS: VASCULAR Aorta: Minimal calcified aortic atheromatous plaque. No aneurysm, dissection, or stenosis. Celiac: Patent without evidence of aneurysm, dissection, vasculitis or significant stenosis. SMA: There is occlusive thrombus in the SMA extending throughout most of its length from just beyond the origin, with limited segmental areas of  collateral reconstitution. The thrombus extends into multiple SMA branches. No definite lesion is identified in the native vessel, suggesting possible embolic origin. Renals: Single left renal artery, widely patent. Duplicated right renal arteries, superior dominant, both patent. IMA: Patent without evidence of aneurysm, dissection, vasculitis or significant stenosis. Inflow: Minimal nonocclusive atheromatous plaque. No aneurysm or stenosis. Proximal Outflow: Bilateral common femoral and visualized portions of the superficial and profunda femoral arteries are patent without evidence of aneurysm, dissection, vasculitis or significant stenosis. Veins: Patent hepatic veins, portal vein, SM V. no portal venous gas. Some of the mesenteric branches from small-bowel are incompletely opacified probably secondary to low flow due to occluded SMA inflow. Iliac venous system, IVC, and renal veins unremarkable. Review of the MIP images confirms the above findings. NON-VASCULAR Lower chest: Coronary calcifications. No pleural or pericardial effusion. Visualized lung bases clear. Hepatobiliary: No focal liver abnormality is seen. Status post cholecystectomy. No biliary dilatation. Pancreas: Unremarkable. No pancreatic ductal dilatation or surrounding inflammatory changes. Spleen: Normal in size without focal abnormality. Adrenals/Urinary Tract: Adrenal glands are unremarkable. Kidneys are normal, without renal calculi, focal lesion, or hydronephrosis. Bladder is unremarkable. Stomach/Bowel: Stomach is partially distended by ingested material, unremarkable. Small bowel is nondilated, with 2 loops demonstrating circumferential persistent wall thickening in the anterior mid abdomen (Im109 and 116,Se5 and 12) . No dilatation or pneumatosis. The colon is incompletely distended by gas and fecal material, unremarkable. Lymphatic: No abdominal or pelvic adenopathy. Reproductive:  Prostate enlargement with central coarse calcifications.  Other: No ascites.  No free air. Musculoskeletal: Small paraumbilical hernia containing only mesenteric fat. Focal skin lesion left anterior pelvic body wall with regional skin thickening and subcutaneous inflammatory/edematous change. Thoracolumbar fusion hardware extending T10 to across the sacroiliac joints bilaterally, appearing intact without surrounding lucency. No acute finding. IMPRESSION: 1. 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 Urgent vascular consultation recommended. 2. Two loops of persistent circumferential small bowel wall thickening in the anterior mid abdomen, without evidence of obstruction or pneumatosis. Critical Value/emergent results were called by telephone at the time of interpretation on 06/09/2022 at 2:24 pm to provider [facility] , who verbally acknowledged these results. Electronically Signed   By: Lucrezia Europe M.D.   On: 06/08/2022 14:26   CT ABDOMEN PELVIS W CONTRAST  Result Date: 05/25/2022 CLINICAL DATA:  Acute nonlocalized abdominal pain EXAM: CT ABDOMEN AND PELVIS WITH CONTRAST TECHNIQUE: Multidetector CT imaging of the abdomen and pelvis was performed using the standard protocol following bolus administration of intravenous contrast. RADIATION DOSE REDUCTION: This exam was performed according to the departmental dose-optimization program which includes automated exposure control, adjustment of the mA and/or kV according to patient size and/or use of iterative reconstruction technique. CONTRAST:  179m OMNIPAQUE IOHEXOL 300 MG/ML  SOLN COMPARISON:  CT December 22, 2019. FINDINGS: Lower chest: No acute abnormality. Hepatobiliary: No suspicious hepatic lesion. Gallbladder surgically absent. No biliary ductal dilation. Pancreas: No pancreatic ductal dilation or evidence of acute inflammation. Spleen: No splenomegaly or focal splenic lesion. Adrenals/Urinary Tract:  Bilateral adrenal glands appear normal. No hydronephrosis. Symmetric excretion of contrast into the collecting systems bilaterally. Probable radiopaque contrast material layering in the urinary bladder. Stomach/Bowel: Stomach is unremarkable for degree of distension. Duodenal diverticulum. No pathologic dilation of small or large bowel. The appendix is surgically absent. Scattered left-sided colonic diverticulosis without evidence of acute diverticulitis. Vascular/Lymphatic: Aortic atherosclerosis. No pathologically enlarged abdominal or pelvic lymph nodes. Reproductive: Dystrophic calcifications in the prostate gland. Other: Small fat containing supraumbilical ventral hernia. Musculoskeletal: Posterior fusion hardware extending from T10 through the sacrum is well as through the bilateral sacroiliac joints. No acute osseous abnormality. IMPRESSION: 1. No acute abnormality in the abdomen or pelvis. 2. Scattered left-sided colonic diverticulosis without evidence of acute diverticulitis. 3. Small fat containing supraumbilical ventral hernia. 4.  Aortic Atherosclerosis (ICD10-I70.0). Electronically Signed   By: JDahlia BailiffM.D.   On: 06/01/2022 09:38   DG Chest Portable 1 View  Result Date: 05/30/2022 CLINICAL DATA:  Generalized abdominal pain. EXAM: PORTABLE CHEST 1 VIEW COMPARISON:  07/29/2020 FINDINGS: Stable elevation of the right diaphragm. No focal consolidation. No pleural effusion or pneumothorax. Heart and mediastinal contours are unremarkable. No acute osseous abnormality. IMPRESSION: No active disease. Electronically Signed   By: HKathreen DevoidM.D.   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   SIRS (systemic inflammatory response syndrome) ([place] 05/15/2022   Hypomagnesemia 05/19/2022    Elevated lactic acid level 06/04/2022   Diabetes mellitus without complication ([place] 131/51/7616  Wound infection following a procedure, other surgical site, sequela 11/23/2020   Status post lumbar spinal fusion (09/14/2020) 11/23/2020   Chronic use of opiate for therapeutic purpose 11/22/2020   Lumbar adjacent segment disease with spondylolisthesis  29/52/8413   Uncomplicated opioid dependence ([place]) 02/20/2020   DDD (degenerative disc disease), lumbar 02/03/2020   Grade 1 Retrolisthesis of L1/L2 01/05/2020   Lumbar disc extrusion with 7 mm caudal migration (L1-2) 01/05/2020   Lumbosacral radiculopathy at L1 (Right) 01/05/2020   Groin pain (Right) 01/05/2020   Neural foraminal stenosis of lumbar spine (L1-2) (Right) 01/05/2020   DDD (degenerative disc disease), lumbosacral 01/05/2020   Acute exacerbation of chronic low back pain 12/30/2019   Pharmacologic therapy 11/25/2019   Disorder of skeletal system 11/25/2019   Problems influencing health status 11/25/2019   Osteoarthritis involving multiple joints 02/23/2019   Chronic musculoskeletal pain 02/23/2019   Insomnia secondary to chronic pain 02/23/2019   Neurogenic claudication 04/02/2018   Chronic bilateral low back pain with bilateral sciatica 03/05/2018   Spondylosis without myelopathy or radiculopathy, lumbosacral region 01/14/2018   Other specified dorsopathies, sacral and sacrococcygeal region 12/26/2017   Acute appendicitis 12/18/2017   Medication monitoring encounter 03/07/2017   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) 02/28/2017   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   Spondylolisthesis at L3-L4 level 08/03/2016   Chronic pain syndrome 07/09/2016   Encounter for medication monitoring 02/24/2016   Cervical spondylosis 12/17/2015   Cervical facet arthropathy 12/17/2015   Chronic neck pain (Bilateral) (R>L) 12/17/2015   Cervical facet syndrome (Bilateral) (R>L) 12/17/2015   Chronic upper extremity pain  (Bilateral) (R>L) 12/17/2015   Cervical foraminal stenosis 12/17/2015    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, C5-6) 12/17/2015   Arthropathy of shoulder (Right) 12/17/2015   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) 12/17/2015   Lumbar foraminal stenosis  (Left: L4-5) (Right: L1-2) 12/17/2015   Grade 1 Anterolisthesis (2 mm) of L4 over L5. 12/17/2015   Lumbar facet (L4-5) synovial cyst (6 mm) (Left) 12/17/2015   Chronic sacroiliac joint pain (Bilateral) (L>R) 12/08/2015   Neurogenic pain 12/08/2015   Abnormal MRI, lumbar spine (12/23/2019) 11/21/2015   Radicular pain of shoulder (Right) 10/03/2015   Carpal tunnel syndrome (Bilateral) 10/03/2015   Chronic cervical radicular pain (Right) 10/03/2015   Chronic low back pain (1ry area of Pain) (Bilateral) (L>R) w/o sciatica 09/06/2015   Opiate use (60 MME/Day) 09/05/2015   Long term prescription opiate use 09/05/2015   Long term current use of opiate analgesic 09/05/2015   Spondylosis of lumbar spine 09/05/2015   Lumbar facet joint syndrome ([place]) 09/05/2015   Chronic hip pain (Left) 09/05/2015   Diabetic peripheral neuropathy ([place]) 09/05/2015   Chronic lumbar radicular pain (S1 Dermatome) (Bilateral) (L>R) 09/05/2015   Hypokalemia 09/05/2015   Chronic shoulder pain (2ry area of Pain) (Bilateral) (L>R) 09/05/2015   Diabetes ([place]) 09/05/2015   Chronic foot pain (3ry area of Pain) (Bilateral) (L>R) 09/05/2015   Chronic hand pain (4th area of Pain) (Bilateral) (R>L) 09/05/2015   Lumbar facet hypertrophy 09/05/2015   Encounter for therapeutic drug level monitoring 09/05/2015   Low testosterone 09/05/2015   Occipital pain (Right) 09/05/2015   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 05/29/2013    Class: History of   Benign fibroma of prostate 05/05/2013   GERD (gastroesophageal reflux disease) 05/05/2013  Benign prostatic hyperplasia 05/05/2013   Essential hypertension 05/08/2011   Hyperlipidemia 05/08/2011    -Agree with current regimen of anticoagulation, serial exams and labs.  -General surgery to follow, closely in anticipation of potential need for evaluation of viability of small bowel/partial small bowel resection.     Thank you for the opportunity to participate in this patient's care.   -- [name], M.D., FACS 05/22/2022, 4:59 PM

## 2022-05-24 ENCOUNTER — Inpatient Hospital Stay: Payer: Medicare HMO

## 2022-05-24 ENCOUNTER — Inpatient Hospital Stay: Payer: Medicare HMO | Admitting: Certified Registered"

## 2022-05-24 ENCOUNTER — Encounter: Admission: EM | Disposition: E | Payer: Self-pay | Source: Home / Self Care | Attending: Pulmonary Disease

## 2022-05-24 ENCOUNTER — Inpatient Hospital Stay: Payer: Self-pay

## 2022-05-24 ENCOUNTER — Encounter: Payer: Self-pay | Admitting: Vascular Surgery

## 2022-05-24 DIAGNOSIS — K55069 Acute infarction of intestine, part and extent unspecified: Secondary | ICD-10-CM | POA: Diagnosis not present

## 2022-05-24 DIAGNOSIS — K55029 Acute infarction of small intestine, extent unspecified: Secondary | ICD-10-CM

## 2022-05-24 DIAGNOSIS — K55059 Acute (reversible) ischemia of intestine, part and extent unspecified: Secondary | ICD-10-CM | POA: Diagnosis not present

## 2022-05-24 HISTORY — PX: LAPAROTOMY: SHX154

## 2022-05-24 LAB — COMPREHENSIVE METABOLIC PANEL
ALT: 19 U/L (ref 0–44)
AST: 37 U/L (ref 15–41)
Albumin: 3.2 g/dL — ABNORMAL LOW (ref 3.5–5.0)
Alkaline Phosphatase: 34 U/L — ABNORMAL LOW (ref 38–126)
Anion gap: 4 — ABNORMAL LOW (ref 5–15)
BUN: 26 mg/dL — ABNORMAL HIGH (ref 8–23)
CO2: 24 mmol/L (ref 22–32)
Calcium: 8.3 mg/dL — ABNORMAL LOW (ref 8.9–10.3)
Chloride: 110 mmol/L (ref 98–111)
Creatinine, Ser: 0.98 mg/dL (ref 0.61–1.24)
GFR, Estimated: >60 mL/min (ref >60)
Glucose, Bld: 301 mg/dL — ABNORMAL HIGH (ref 70–99)
Potassium: 3.7 mmol/L (ref 3.5–5.1)
Sodium: 138 mmol/L (ref 135–145)
Total Bilirubin: 1.3 mg/dL — ABNORMAL HIGH (ref 0.3–1.2)
Total Protein: 5.5 g/dL — ABNORMAL LOW (ref 6.5–8.1)

## 2022-05-24 LAB — BASIC METABOLIC PANEL
Anion gap: 12 (ref 5–15)
BUN: 21 mg/dL (ref 8–23)
CO2: 22 mmol/L (ref 22–32)
Calcium: 8.1 mg/dL — ABNORMAL LOW (ref 8.9–10.3)
Chloride: 104 mmol/L (ref 98–111)
Creatinine, Ser: 1.06 mg/dL (ref 0.61–1.24)
GFR, Estimated: >60 mL/min (ref >60)
Glucose, Bld: 347 mg/dL — ABNORMAL HIGH (ref 70–99)
Potassium: 3.5 mmol/L (ref 3.5–5.1)
Sodium: 138 mmol/L (ref 135–145)

## 2022-05-24 LAB — CBC
HCT: 38.7 % — ABNORMAL LOW (ref 39.0–52.0)
Hemoglobin: 12.9 g/dL — ABNORMAL LOW (ref 13.0–17.0)
MCH: 29.3 pg (ref 26.0–34.0)
MCHC: 33.3 g/dL (ref 30.0–36.0)
MCV: 87.8 fL (ref 80.0–100.0)
Platelets: 309 10*3/uL (ref 150–400)
RBC: 4.41 MIL/uL (ref 4.22–5.81)
RDW: 14 % (ref 11.5–15.5)
WBC: 22.4 10*3/uL — ABNORMAL HIGH (ref 4.0–10.5)
nRBC: 0.1 % (ref 0.0–0.2)

## 2022-05-24 LAB — BLOOD GAS, ARTERIAL
Acid-Base Excess: 1.5 mmol/L (ref 0.0–2.0)
Acid-base deficit: 0.5 mmol/L (ref 0.0–2.0)
Bicarbonate: 24.2 mmol/L (ref 20.0–28.0)
Bicarbonate: 25.9 mmol/L (ref 20.0–28.0)
FIO2: 40 %
MECHVT: 550 mL
O2 Saturation: 99.2 %
O2 Saturation: 98.3 %
PEEP: 6 cmH2O
Patient temperature: 37
Patient temperature: 37
RATE: 12 resp/min
pCO2 arterial: 39 mmHg (ref 32–48)
pCO2 arterial: 39 mmHg (ref 32–48)
pH, Arterial: 7.4 (ref 7.35–7.45)
pH, Arterial: 7.43 (ref 7.35–7.45)
pO2, Arterial: 348 mmHg — ABNORMAL HIGH (ref 83–108)
pO2, Arterial: 106 mmHg (ref 83–108)

## 2022-05-24 LAB — TYPE AND SCREEN
ABO/RH(D): A POS
Antibody Screen: NEGATIVE

## 2022-05-24 LAB — CBC WITH DIFFERENTIAL/PLATELET
Abs Immature Granulocytes: 0.09 10*3/uL — ABNORMAL HIGH (ref 0.00–0.07)
Basophils Absolute: 0 10*3/uL (ref 0.0–0.1)
Basophils Relative: 0 %
Eosinophils Absolute: 0.3 10*3/uL (ref 0.0–0.5)
Eosinophils Relative: 2 %
HCT: 33.2 % — ABNORMAL LOW (ref 39.0–52.0)
Hemoglobin: 11.1 g/dL — ABNORMAL LOW (ref 13.0–17.0)
Immature Granulocytes: 0 %
Lymphocytes Relative: 6 %
Lymphs Abs: 1.3 10*3/uL (ref 0.7–4.0)
MCH: 29.7 pg (ref 26.0–34.0)
MCHC: 33.4 g/dL (ref 30.0–36.0)
MCV: 88.8 fL (ref 80.0–100.0)
Monocytes Absolute: 1.2 10*3/uL — ABNORMAL HIGH (ref 0.1–1.0)
Monocytes Relative: 6 %
Neutro Abs: 17.4 10*3/uL — ABNORMAL HIGH (ref 1.7–7.7)
Neutrophils Relative %: 86 %
Platelets: 211 10*3/uL (ref 150–400)
RBC: 3.74 MIL/uL — ABNORMAL LOW (ref 4.22–5.81)
RDW: 14.3 % (ref 11.5–15.5)
WBC: 20.4 10*3/uL — ABNORMAL HIGH (ref 4.0–10.5)
nRBC: 0 % (ref 0.0–0.2)

## 2022-05-24 LAB — LACTIC ACID, PLASMA
Lactic Acid, Venous: 3.4 mmol/L (ref 0.5–1.9)
Lactic Acid, Venous: 3.5 mmol/L (ref 0.5–1.9)
Lactic Acid, Venous: 2.1 mmol/L (ref 0.5–1.9)
Lactic Acid, Venous: 1.7 mmol/L (ref 0.5–1.9)

## 2022-05-24 LAB — PROCALCITONIN: Procalcitonin: 6.85 ng/mL

## 2022-05-24 LAB — MAGNESIUM
Magnesium: 1.7 mg/dL (ref 1.7–2.4)
Magnesium: 1.6 mg/dL — ABNORMAL LOW (ref 1.7–2.4)

## 2022-05-24 LAB — PHOSPHORUS
Phosphorus: 3.4 mg/dL (ref 2.5–4.6)
Phosphorus: 3.2 mg/dL (ref 2.5–4.6)

## 2022-05-24 LAB — GLUCOSE, CAPILLARY
Glucose-Capillary: 351 mg/dL — ABNORMAL HIGH (ref 70–99)
Glucose-Capillary: 338 mg/dL — ABNORMAL HIGH (ref 70–99)
Glucose-Capillary: 288 mg/dL — ABNORMAL HIGH (ref 70–99)
Glucose-Capillary: 243 mg/dL — ABNORMAL HIGH (ref 70–99)
Glucose-Capillary: 225 mg/dL — ABNORMAL HIGH (ref 70–99)
Glucose-Capillary: 209 mg/dL — ABNORMAL HIGH (ref 70–99)

## 2022-05-24 LAB — HEMOGLOBIN A1C
Hgb A1c MFr Bld: 8.5 % — ABNORMAL HIGH (ref 4.8–5.6)
Mean Plasma Glucose: 197 mg/dL

## 2022-05-24 LAB — TROPONIN I (HIGH SENSITIVITY)
Troponin I (High Sensitivity): 41 ng/L — ABNORMAL HIGH (ref <18)
Troponin I (High Sensitivity): 37 ng/L — ABNORMAL HIGH (ref <18)

## 2022-05-24 LAB — MRSA NEXT GEN BY PCR, NASAL: MRSA by PCR Next Gen: DETECTED — AB

## 2022-05-24 LAB — HIV ANTIBODY (ROUTINE TESTING W REFLEX): HIV Screen 4th Generation wRfx: NONREACTIVE

## 2022-05-24 LAB — HEPARIN LEVEL (UNFRACTIONATED)
Heparin Unfractionated: 0.6 IU/mL (ref 0.30–0.70)
Heparin Unfractionated: 0.49 IU/mL (ref 0.30–0.70)
Heparin Unfractionated: 0.45 IU/mL (ref 0.30–0.70)

## 2022-05-24 SURGERY — LAPAROTOMY, EXPLORATORY
Anesthesia: General

## 2022-05-24 MED ORDER — POLYETHYLENE GLYCOL 3350 17 G PO PACK
17.0000 g | PACK | Freq: Every day | ORAL | Status: DC
  Administered 2022-05-25: 17 g
  Filled 2022-05-24: qty 1

## 2022-05-24 MED ORDER — MIDAZOLAM HCL 2 MG/2ML IJ SOLN
1.0000 mg | INTRAMUSCULAR | Status: DC | PRN
  Administered 2022-05-24 (×2): 2 mg via INTRAVENOUS
  Filled 2022-05-24 (×2): qty 2

## 2022-05-24 MED ORDER — PROPOFOL 1000 MG/100ML IV EMUL
5.0000 ug/kg/min | INTRAVENOUS | Status: DC
  Administered 2022-05-24: 80 ug/kg/min via INTRAVENOUS
  Administered 2022-05-24: 25 ug/kg/min via INTRAVENOUS
  Administered 2022-05-25 (×2): 35 ug/kg/min via INTRAVENOUS
  Administered 2022-05-25: 50 ug/kg/min via INTRAVENOUS
  Filled 2022-05-24 (×5): qty 100

## 2022-05-24 MED ORDER — MIDAZOLAM HCL 2 MG/2ML IJ SOLN
INTRAMUSCULAR | Status: AC
  Filled 2022-05-24: qty 2

## 2022-05-24 MED ORDER — PIPERACILLIN-TAZOBACTAM 3.375 G IVPB 30 MIN
3.3750 g | Freq: Three times a day (TID) | INTRAVENOUS | Status: DC
  Administered 2022-05-24 – 2022-05-25 (×4): 3.375 g via INTRAVENOUS
  Filled 2022-05-24 (×7): qty 50

## 2022-05-24 MED ORDER — MUPIROCIN 2 % EX OINT
1.0000 | TOPICAL_OINTMENT | Freq: Two times a day (BID) | CUTANEOUS | Status: DC
  Administered 2022-05-24 – 2022-05-25 (×3): 1 via NASAL
  Filled 2022-05-24: qty 22

## 2022-05-24 MED ORDER — FENTANYL CITRATE PF 50 MCG/ML IJ SOSY
25.0000 ug | PREFILLED_SYRINGE | Freq: Once | INTRAMUSCULAR | Status: AC
  Administered 2022-05-24: 25 ug via INTRAVENOUS

## 2022-05-24 MED ORDER — ROCURONIUM BROMIDE 100 MG/10ML IV SOLN
INTRAVENOUS | Status: DC | PRN
  Administered 2022-05-24: 60 mg via INTRAVENOUS
  Administered 2022-05-24: 40 mg via INTRAVENOUS

## 2022-05-24 MED ORDER — LORAZEPAM 2 MG/ML IJ SOLN
0.5000 mg | Freq: Once | INTRAMUSCULAR | Status: AC
  Administered 2022-05-24: 0.5 mg via INTRAVENOUS
  Filled 2022-05-24: qty 1

## 2022-05-24 MED ORDER — DOCUSATE SODIUM 50 MG/5ML PO LIQD
100.0000 mg | Freq: Two times a day (BID) | ORAL | Status: DC
  Administered 2022-05-25: 100 mg
  Filled 2022-05-24: qty 10

## 2022-05-24 MED ORDER — SODIUM CHLORIDE 0.9% FLUSH: 9.0000 mL | INTRAVENOUS | Status: DC | PRN

## 2022-05-24 MED ORDER — PANTOPRAZOLE SODIUM 40 MG IV SOLR
40.0000 mg | INTRAVENOUS | Status: DC
  Administered 2022-05-24 – 2022-05-25 (×2): 40 mg via INTRAVENOUS
  Filled 2022-05-24 (×2): qty 10

## 2022-05-24 MED ORDER — FENTANYL 2500MCG IN NS 250ML (10MCG/ML) PREMIX INFUSION
INTRAVENOUS | Status: AC
  Filled 2022-05-24: qty 250

## 2022-05-24 MED ORDER — FENTANYL CITRATE (PF) 100 MCG/2ML IJ SOLN
INTRAMUSCULAR | Status: DC | PRN
  Administered 2022-05-24: 100 ug via INTRAVENOUS
  Administered 2022-05-24: 50 ug via INTRAVENOUS
  Administered 2022-05-24: 100 ug via INTRAVENOUS

## 2022-05-24 MED ORDER — INSULIN ASPART 100 UNIT/ML IJ SOLN
0.0000 [IU] | INTRAMUSCULAR | Status: DC
  Administered 2022-05-24 (×3): 5 [IU] via SUBCUTANEOUS
  Administered 2022-05-25 (×3): 3 [IU] via SUBCUTANEOUS
  Filled 2022-05-24 (×6): qty 1

## 2022-05-24 MED ORDER — NALOXONE HCL 0.4 MG/ML IJ SOLN: 0.4000 mg | INTRAMUSCULAR | Status: DC | PRN

## 2022-05-24 MED ORDER — LACTATED RINGERS IV BOLUS
1000.0000 mL | Freq: Once | INTRAVENOUS | Status: AC
  Administered 2022-05-24: 1000 mL via INTRAVENOUS

## 2022-05-24 MED ORDER — CALCIUM CHLORIDE 10 % IV SOLN
INTRAVENOUS | Status: AC
  Filled 2022-05-24: qty 10

## 2022-05-24 MED ORDER — NOREPINEPHRINE 4 MG/250ML-% IV SOLN
INTRAVENOUS | Status: AC
  Filled 2022-05-24: qty 250

## 2022-05-24 MED ORDER — MIDAZOLAM HCL 2 MG/2ML IJ SOLN
INTRAMUSCULAR | Status: DC | PRN
  Administered 2022-05-24 (×2): 2 mg via INTRAVENOUS

## 2022-05-24 MED ORDER — PIPERACILLIN-TAZOBACTAM 3.375 G IVPB
INTRAVENOUS | Status: AC
  Filled 2022-05-24: qty 50

## 2022-05-24 MED ORDER — FENTANYL BOLUS VIA INFUSION
25.0000 ug | INTRAVENOUS | Status: DC | PRN
  Administered 2022-05-24: 50 ug via INTRAVENOUS

## 2022-05-24 MED ORDER — ALBUMIN HUMAN 5 % IV SOLN
25.0000 g | Freq: Once | INTRAVENOUS | Status: AC
  Administered 2022-05-25: 25 g via INTRAVENOUS
  Filled 2022-05-24: qty 500

## 2022-05-24 MED ORDER — CHLORHEXIDINE GLUCONATE 0.12 % MT SOLN
15.0000 mL | Freq: Once | OROMUCOSAL | Status: AC
  Administered 2022-05-24: 15 mL via OROMUCOSAL
  Filled 2022-05-24: qty 15

## 2022-05-24 MED ORDER — ALBUMIN HUMAN 5 % IV SOLN: INTRAVENOUS | Status: DC | PRN

## 2022-05-24 MED ORDER — INSULIN GLARGINE-YFGN 100 UNIT/ML ~~LOC~~ SOLN
10.0000 [IU] | Freq: Every day | SUBCUTANEOUS | Status: DC
  Filled 2022-05-24 (×2): qty 0.1

## 2022-05-24 MED ORDER — ACETAMINOPHEN 650 MG RE SUPP
650.0000 mg | RECTAL | Status: DC | PRN
  Administered 2022-05-24 (×2): 650 mg via RECTAL
  Filled 2022-05-24 (×2): qty 1

## 2022-05-24 MED ORDER — SUCCINYLCHOLINE CHLORIDE 200 MG/10ML IV SOSY
PREFILLED_SYRINGE | INTRAVENOUS | Status: DC | PRN
  Administered 2022-05-24: 60 mg via INTRAVENOUS

## 2022-05-24 MED ORDER — ACETAMINOPHEN 325 MG PO TABS: 650.0000 mg | ORAL_TABLET | Freq: Four times a day (QID) | ORAL | Status: DC | PRN

## 2022-05-24 MED ORDER — VASOPRESSIN 20 UNIT/ML IV SOLN
INTRAVENOUS | Status: DC | PRN
  Administered 2022-05-24 (×2): 2 [IU] via INTRAVENOUS

## 2022-05-24 MED ORDER — DIPHENHYDRAMINE HCL 50 MG/ML IJ SOLN: 12.5000 mg | Freq: Four times a day (QID) | INTRAMUSCULAR | Status: DC | PRN

## 2022-05-24 MED ORDER — PHENYLEPHRINE 80 MCG/ML (10ML) SYRINGE FOR IV PUSH (FOR BLOOD PRESSURE SUPPORT)
PREFILLED_SYRINGE | INTRAVENOUS | Status: DC | PRN
  Administered 2022-05-24 (×2): 160 ug via INTRAVENOUS

## 2022-05-24 MED ORDER — LIDOCAINE HCL (CARDIAC) PF 100 MG/5ML IV SOSY
PREFILLED_SYRINGE | INTRAVENOUS | Status: DC | PRN
  Administered 2022-05-24: 80 mg via INTRAVENOUS

## 2022-05-24 MED ORDER — BUPIVACAINE-EPINEPHRINE (PF) 0.25% -1:200000 IJ SOLN
INTRAMUSCULAR | Status: AC
  Filled 2022-05-24: qty 30

## 2022-05-24 MED ORDER — NOREPINEPHRINE 16 MG/250ML-% IV SOLN
0.0000 ug/min | INTRAVENOUS | Status: DC
  Administered 2022-05-24: 2 ug/min via INTRAVENOUS
  Filled 2022-05-24: qty 250

## 2022-05-24 MED ORDER — HEPARIN (PORCINE) 25000 UT/250ML-% IV SOLN
1500.0000 [IU]/h | INTRAVENOUS | Status: DC
  Administered 2022-05-24 – 2022-05-25 (×2): 1500 [IU]/h via INTRAVENOUS
  Filled 2022-05-24 (×2): qty 250

## 2022-05-24 MED ORDER — CALCIUM CHLORIDE 10 % IV SOLN
INTRAVENOUS | Status: DC | PRN
  Administered 2022-05-24 (×2): 500 mg via INTRAVENOUS

## 2022-05-24 MED ORDER — FENTANYL 2500MCG IN NS 250ML (10MCG/ML) PREMIX INFUSION
25.0000 ug/h | INTRAVENOUS | Status: DC
  Administered 2022-05-24 – 2022-05-25 (×4): 200 ug/h via INTRAVENOUS
  Filled 2022-05-24 (×3): qty 250

## 2022-05-24 MED ORDER — HYDROMORPHONE 1 MG/ML IV SOLN
INTRAVENOUS | Status: DC
  Administered 2022-05-24: 30 mg via INTRAVENOUS
  Filled 2022-05-24 (×3): qty 30

## 2022-05-24 MED ORDER — ORAL CARE MOUTH RINSE
15.0000 mL | OROMUCOSAL | Status: DC
  Administered 2022-05-24 – 2022-05-25 (×11): 15 mL via OROMUCOSAL

## 2022-05-24 MED ORDER — CHLORHEXIDINE GLUCONATE CLOTH 2 % EX PADS
6.0000 | MEDICATED_PAD | Freq: Every day | CUTANEOUS | Status: DC
  Administered 2022-05-24 – 2022-05-25 (×2): 6 via TOPICAL

## 2022-05-24 MED ORDER — ORAL CARE MOUTH RINSE: 15.0000 mL | OROMUCOSAL | Status: DC | PRN

## 2022-05-24 MED ORDER — INSULIN ASPART 100 UNIT/ML IJ SOLN
10.0000 [IU] | Freq: Once | INTRAMUSCULAR | Status: AC
  Administered 2022-05-24: 10 [IU] via SUBCUTANEOUS

## 2022-05-24 MED ORDER — INSULIN ASPART 100 UNIT/ML IJ SOLN
INTRAMUSCULAR | Status: AC
  Filled 2022-05-24: qty 1

## 2022-05-24 MED ORDER — DIPHENHYDRAMINE HCL 12.5 MG/5ML PO ELIX: 12.5000 mg | ORAL_SOLUTION | Freq: Four times a day (QID) | ORAL | Status: DC | PRN

## 2022-05-24 MED ORDER — MAGNESIUM SULFATE 2 GM/50ML IV SOLN
2.0000 g | Freq: Once | INTRAVENOUS | Status: AC
  Administered 2022-05-24: 2 g via INTRAVENOUS
  Filled 2022-05-24: qty 50

## 2022-05-24 MED ORDER — FENTANYL CITRATE (PF) 250 MCG/5ML IJ SOLN
INTRAMUSCULAR | Status: AC
  Filled 2022-05-24: qty 5

## 2022-05-24 MED ORDER — ALBUMIN HUMAN 5 % IV SOLN
INTRAVENOUS | Status: AC
  Filled 2022-05-24: qty 500

## 2022-05-24 MED ORDER — LACTATED RINGERS IV SOLN: INTRAVENOUS | Status: DC | PRN

## 2022-05-24 MED ORDER — NOREPINEPHRINE 4 MG/250ML-% IV SOLN
INTRAVENOUS | Status: DC | PRN
  Administered 2022-05-24: 8 ug/min via INTRAVENOUS

## 2022-05-24 MED ORDER — EPINEPHRINE PF 1 MG/ML IJ SOLN
INTRAMUSCULAR | Status: DC | PRN
  Administered 2022-05-24: .02 mg via INTRAVENOUS
  Administered 2022-05-24: .01 mg via INTRAVENOUS
  Administered 2022-05-24: .02 mg via INTRAVENOUS

## 2022-05-24 MED ORDER — ETOMIDATE 2 MG/ML IV SOLN
INTRAVENOUS | Status: DC | PRN
  Administered 2022-05-24: 18 mg via INTRAVENOUS

## 2022-05-24 SURGICAL SUPPLY — 38 items
APPLIER CLIP 11 MED OPEN (CLIP)
APPLIER CLIP 13 LRG OPEN (CLIP)
BLADE CLIPPER SURG (BLADE) IMPLANT
CANISTER WOUND CARE 500ML ATS (WOUND CARE) IMPLANT
CHLORAPREP W/TINT 26 (MISCELLANEOUS) ×1 IMPLANT
CLIP APPLIE 11 MED OPEN (CLIP) IMPLANT
CLIP APPLIE 13 LRG OPEN (CLIP) IMPLANT
COVER BACK TABLE REUSABLE LG (DRAPES) ×1 IMPLANT
DRAPE C-SECTION (MISCELLANEOUS) ×1 IMPLANT
ELECT BLADE 6.5 EXT (BLADE) ×1 IMPLANT
ELECT CAUTERY BLADE 6.4 (BLADE) ×1 IMPLANT
ELECT REM PT RETURN 9FT ADLT (ELECTROSURGICAL) ×1
ELECTRODE REM PT RTRN 9FT ADLT (ELECTROSURGICAL) ×1 IMPLANT
GAUZE 4X4 16PLY ~~LOC~~+RFID DBL (SPONGE) ×1 IMPLANT
GLOVE ORTHO TXT STRL SZ7.5 (GLOVE) ×2 IMPLANT
GOWN STRL REUS W/ TWL LRG LVL3 (GOWN DISPOSABLE) ×2 IMPLANT
GOWN STRL REUS W/ TWL XL LVL3 (GOWN DISPOSABLE) ×2 IMPLANT
GOWN STRL REUS W/TWL LRG LVL3 (GOWN DISPOSABLE) ×2
GOWN STRL REUS W/TWL XL LVL3 (GOWN DISPOSABLE) ×1
HANDLE YANKAUER SUCT BULB TIP (MISCELLANEOUS) IMPLANT
LIGASURE IMPACT 36 18CM CVD LR (INSTRUMENTS) IMPLANT
MANIFOLD NEPTUNE II (INSTRUMENTS) ×1 IMPLANT
NS IRRIG 1000ML POUR BTL (IV SOLUTION) ×1 IMPLANT
PACK BASIN MAJOR ARMC (MISCELLANEOUS) ×1 IMPLANT
PACK COLON CLEAN CLOSURE (MISCELLANEOUS) IMPLANT
RELOAD LINEAR CUT PROX 55 BLUE (ENDOMECHANICALS) ×1 IMPLANT
RELOAD STAPLE 55 3.8 BLU REG (ENDOMECHANICALS) IMPLANT
SPONGE ABDOMINAL VAC ABTHERA (MISCELLANEOUS) IMPLANT
SPONGE T-LAP 18X18 ~~LOC~~+RFID (SPONGE) ×4 IMPLANT
STAPLER PROXIMATE 55 BLUE (STAPLE) IMPLANT
STAPLER SKIN PROX 35W (STAPLE) ×1 IMPLANT
SUT PDS AB 1 CT  36 (SUTURE)
SUT PDS AB 1 CT 36 (SUTURE) ×3 IMPLANT
SUT SILK 3-0 (SUTURE) ×1 IMPLANT
SUT VICRYL 2-0 54IN ABS (SUTURE) IMPLANT
TRAP FLUID SMOKE EVACUATOR (MISCELLANEOUS) ×1 IMPLANT
TRAY FOLEY MTR SLVR 16FR STAT (SET/KITS/TRAYS/PACK) ×1 IMPLANT
WATER STERILE IRR 500ML POUR (IV SOLUTION) ×1 IMPLANT

## 2022-05-24 NOTE — Anesthesia Procedure Notes (Signed)
Arterial Line Insertion Start/End12/24/2023 10:32 AM, 05/13/2022 10:35 AM Performed by: Molli Barrows, MD, Esaw Grandchild, CRNA, CRNA  Patient location: OR. Preanesthetic checklist: patient identified, IV checked, site marked, risks and benefits discussed, surgical consent, monitors and equipment checked, pre-op evaluation, timeout performed and anesthesia consent Lidocaine 1% used for infiltration and patient sedated Right, radial was placed Catheter size: 20 G Hand hygiene performed  and Seldinger technique used Allen's test indicative of satisfactory collateral circulation Attempts: 1 Procedure performed without using ultrasound guided technique. Following insertion, Biopatch and dressing applied. Post procedure assessment: normal  Patient tolerated the procedure well with no immediate complications.

## 2022-05-24 NOTE — Consult Note (Signed)
NAME:  Howard Murphy, MRN:  [id number], DOB:  02/20/1956, LOS: 1 ADMISSION DATE:  06/04/2022, CONSULTATION DATE: 06/07/2022 REFERRING MD: Dr. Christian Mate, CHIEF COMPLAINT: Abdominal Pain    History of Present Illness:  This is a 66 yo male who presented to [facility] ER via EMS on 12/13 with acute generalized abdominal pain and 2 episodes of vomiting the morning of 12/13.  ED Course Upon arrival to the ER pt had severe abdominal pain with nausea/vomiting.  CT Abd Pelvis negative for acute abnormality.  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  Pt received empiric zosyn. General Surgeon Dr. Christian Mate and Vascular Surgeon Dr. Lucky Cowboy  consulted by ER physician. Pt transported for emergent thrombectomy with stent placement per vascular surgery recommendations.  Pt subsequently admitted to the progressive care unit postop per hospitalist team. See detailed hospital course under significant events   Significant labs: K+ 2.7/glucose 374/phosphorous 2.3/magnesium 1.3/lactic acid >9.0/wbc 18.7 CXR negative for acute abnormality  EKG: Sinus tachycardia, heart rate 101, no ST abnormalities   CT Abd Pelvis W Contrast revealed no acute abnormality in the abdomen or pelvis. Scattered left-sided colonic diverticulosis without evidence of acute diverticulitis. Small fat containing supraumbilical ventral hernia. Aortic Atherosclerosis (ICD10-I70.0).  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 Urgent vascular consultation recommended. Two loops of persistent circumferential small bowel wall thickening in the anterior mid abdomen, without evidence of obstruction or pneumatosis.  Pertinent  Medical History  Arthritis  Type II Diabetes Mellitus GERD Headache Kidney Stones HTN HLD Nephrolithiasis  Dyspnea    Significant Hospital Events: Including procedures, antibiotic start and stop dates in addition to other pertinent events   12/13: Pt admitted to the progressive care unit with acute mesenteric ischemia with SMA embolus s/p thrombectomy with stent placement  12/14: Overnight pt noted to have uncontrolled abdominal pain despite PCA with associated tachycardia, low-grade fever, and leukocytosis concerning for infarcted bowel.  Pt transported to OR for exploratory laparotomy per General Surgery  12/14: Pt transferred to ICU mechanically intubated post exploratory laparotomy, resection of portion of jejunum, and placement of ABThera woundvac  Micro Data:  MRSA PCR 12/14>>   Antimicrobials   Cefazolin 12/13 x1 dose  Zosyn 12/14>>  Interim History / Subjective:  Pt arrived to ICU postop mechanically intubated.  Pt hypertensive sbp 190's and tachycardic hr 150-160 despite fentanyl gtt and prn versed.  Propofol gtt added with improvement in vital signs   Objective   Blood pressure (!) 118/91, pulse (!) 153, temperature 100 F (37.8 C), temperature source Temporal, resp. rate (!) 33, height _0  (1.702 m), weight 80.3 kg, SpO2 94 %.        Intake/Output Summary (Last 24 hours) at 06/05/2022 1220 Last data filed at 05/15/2022 1216 Gross per 24 hour  Intake 6249.66 ml  Output 2555 ml  Net 3694.66 ml   Filed Weights   06/07/2022 1052 05/21/2022 0913  Weight: 80.3 kg 80.3 kg    Examination: General: Acutely-ill appearing male, mechanically intubated  HENT: Supple, no JVD  Lungs: Faint rhonchi throughout, even, non labored Cardiovascular: Sinus tachycardia, s1s2, no m/r/g, 2+ radial/2+ distal pulses, no edema  Abdomen: No audible BS, midline abdominal wound vac dry/intact see below   Extremities: Normal bulk and tone  Neuro: Sedated, withdraws and opens eyes to stimulation, PERRL GU: Indwelling foley catheter draining yellow urine  Resolved Hospital Problem list     Assessment &  Plan:  Intraoperative mechanical intubation  - Full vent support for now: vent settings reviewed and established  - Continue lung protective strategies  - SBT once all parameters met  - VAP bundle implemented - Intermittent ABG's and CXR's  - Pulmonary toilet   Sepsis with septic shock  Mildly elevated troponin likely demand ischemia  Hx: HTN and HLD  - Continuous telemetry monitoring  - Trend troponin's until peaked  - IV fluid resuscitation and prn levophed gtt to maintain map >65 - Hold outpatient antihypertensives for now   Acute mesenteric ischemia with SMA embolus s/p thrombectomy with stent placement  - Vascular surgery consulted appreciate input  - Heparin gtt resumed post exploratory laparotomy per General Surgery recommendations~monitor for s/sx of bleeding and transfuse for hgb <7  Hypomagnesia  Lactic acidosis  - Trend BMP and lactic acid  - Replace electrolytes as indicated  - Monitor UOP   Ischemic bowel s/p bowel resection  - Trend WBC and monitor fever curve  - Trend PCT  - Follow cultures  - Continue abx therapy as outlined above - Monitor woundvac output    GERD - IV PPI   Type II diabetes mellitus  - Hemoglobin A1c pending - CBG's q4hrs  - SSI changed to moderate scale; continue scheduled semglee  - Diabetes coordinator consulted appreciate input   Sedation needs during mechanical intubation  Postop pain  - Maintain RASS goal -1 for now - PAD protocol to maintain RASS goal: Propofol/fentanyl gtts and prn versed  - WUA daily   Best Practice (right click and "Reselect all SmartList Selections" daily)   Diet/type: NPO DVT prophylaxis: systemic heparin GI prophylaxis: PPI Lines: Central line and Arterial Line; PICC line placement pending and once placed will remove central line Foley:  Yes, and it is still needed Code Status:  full code Last date of multidisciplinary goals of care discussion [N/A]  Labs   CBC: Recent Labs  Lab  06/06/2022 0941 05/16/2022 0217  WBC 18.7* 22.4*  NEUTROABS 15.5*  --   HGB 13.6 12.9*  HCT 41.9 38.7*  MCV 89.0 87.8  PLT 350 258    Basic Metabolic Panel: Recent Labs  Lab 06/09/2022 0940 06/07/2022 0941 06/03/2022 0217  NA  --  135 138  K  --  2.7* 3.5  CL  --  100 104  CO2  --  22 22  GLUCOSE  --  374* 347*  BUN  --  18 21  CREATININE  --  1.00 1.06  CALCIUM  --  9.0 8.1*  MG 1.3*  --  1.7  PHOS 2.3*  --  3.4   GFR: Estimated Creatinine Clearance: 69.6 mL/min (by C-G formula based on SCr of 1.06 mg/dL). Recent Labs  Lab 05/12/2022 0940 05/20/2022 0941 06/09/2022 1240 05/11/2022 1415 05/27/2022 1950 05/22/2022 2137 05/12/2022 0217  PROCALCITON <0.10  --   --   --   --   --   --   WBC  --  18.7*  --   --   --   --  22.4*  LATICACIDVEN  --   --    < > >9.0* 6.1* 4.6* 3.4*   < > = values in this interval not displayed.    Liver Function Tests: Recent Labs  Lab 06/09/2022 0941  AST 33  ALT 22  ALKPHOS 53  BILITOT 0.8  PROT 6.9  ALBUMIN 3.8   Recent Labs  Lab 05/28/2022  0941  LIPASE 43   No results for input(s): "AMMONIA" in the last 168 hours.  ABG    Component Value Date/Time   PHART 7.4 05/22/2022 1119   PCO2ART 39 05/17/2022 1119   PO2ART 348 (H) 05/14/2022 1119   HCO3 24.2 06/07/2022 1119   ACIDBASEDEF 0.5 05/29/2022 1119   O2SAT 99.2 05/22/2022 1119     Coagulation Profile: Recent Labs  Lab 05/28/2022 1450  INR 1.2    Cardiac Enzymes: No results for input(s): "CKTOTAL", "CKMB", "CKMBINDEX", "TROPONINI" in the last 168 hours.  HbA1C: Hgb A1c MFr Bld  Date/Time Value Ref Range Status  09/25/2020 05:06 AM 6.0 (H) 4.8 - 5.6 % Final    Comment:    (NOTE)         Prediabetes: 5.7 - 6.4         Diabetes: >6.4         Glycemic control for adults with diabetes: <7.0   03/07/2017 02:43 PM 6.9 (H) 4.8 - 5.6 % Final    Comment:             Prediabetes: 5.7 - 6.4          Diabetes: >6.4          Glycemic control for adults with diabetes: <7.0      CBG: Recent Labs  Lab 05/27/2022 1529 05/17/2022 1903 06/01/2022 2150 05/14/2022 0921 06/10/2022 0952  GLUCAP 338* 350* 350* 351* 338*    Review of Systems:   Unable to assess pt mechanically intubated   Past Medical History:  He,  has a past medical history of Arthritis, Back ache (05/05/2013), Diabetes mellitus without complication ([place]), GERD (gastroesophageal reflux disease), Headache, History of kidney stones, Hyperlipemia, Hypertension, Kidney stones, Kidney stones, Nephrolithiasis, and Shortness of breath dyspnea.   Surgical History:   Past Surgical History:  Procedure Laterality Date   APPENDECTOMY     BACK SURGERY  2015   x3   BACK SURGERY  08/09/2016   BACK SURGERY Bilateral 2019   CHOLECYSTECTOMY  2000   COLONOSCOPY WITH PROPOFOL N/A 04/11/2015   Procedure: COLONOSCOPY WITH PROPOFOL;  Surgeon: Manya Silvas, MD;  Location: [facility] ENDOSCOPY;  Service: Endoscopy;  Laterality: N/A;   CYSTOSCOPY WITH URETEROSCOPY AND STENT PLACEMENT Right 08/27/2017   Procedure: CYSTOSCOPY WITH URETEROSCOPY AND STENT PLACEMENT;  Surgeon: Abbie Sons, MD;  Location: [facility] ORS;  Service: Urology;  Laterality: Right;   ESOPHAGOGASTRODUODENOSCOPY (EGD) WITH PROPOFOL N/A 04/11/2015   Procedure: ESOPHAGOGASTRODUODENOSCOPY (EGD) WITH PROPOFOL;  Surgeon: Manya Silvas, MD;  Location[facility];  Service: Endoscopy;  Laterality: N/A;   INCISION AND DRAINAGE N/A 10/22/2020   Procedure: INCISION AND DRAINAGE of POSTERIOR LUMBAR;  Surgeon: Meade Maw, MD;  Location: [facility] ORS;  Service: Neurosurgery;  Laterality: N/A;   LAPAROSCOPIC APPENDECTOMY N/A 12/18/2017   Procedure: APPENDECTOMY LAPAROSCOPIC;  Surgeon: Florene Glen, MD;  Location: [facility] ORS;  Service: General;  Laterality: N/A;   POSTERIOR LUMBAR FUSION 4 LEVEL N/A 09/14/2020   Procedure: OPEN T10-PELVIS POSTERIOR SPINAL FUSION (HARDWARE ALREADY PRESENT AT L2-S2); L1-2 POSTERIOR SPINAL DECOMPRESSION;  Surgeon: Meade Maw, MD;  Location: [facility] ORS;  Service: Neurosurgery;  Laterality: N/A;   SHOULDER SURGERY Right 2016   X2   SHOULDER SURGERY Left 06/2015   VISCERAL ANGIOGRAPHY N/A 06/04/2022   Procedure: VISCERAL ANGIOGRAPHY;  Surgeon: Algernon Huxley, MD;  Location: [place] [facility];  Service: Cardiovascular;  Laterality: N/A;     Social History:   reports that he  has never smoked. He quit smokeless tobacco use about 9 years ago.  His smokeless tobacco use included chew. He reports that he does not drink alcohol and does not use drugs.   Family History:  His family history includes Cancer in his maternal grandmother, mother, sister, and sister; Heart disease in his father; Stroke in his father. There is no history of Bladder Cancer, Prostate cancer, or Kidney cancer.   Allergies Allergies  Allergen Reactions   Adhesive [Tape] Other (See Comments)    Honeycomb dressing, blisters     Home Medications  Prior to Admission medications   Medication Sig Start Date End Date Taking? Authorizing Provider  acetaminophen (TYLENOL) 500 MG tablet Take 2 tablets (1,000 mg total) by mouth every 6 (six) hours. 09/17/20  Yes Meade Maw, MD  celecoxib (CELEBREX) 200 MG capsule Take 200 mg by mouth 2 (two) times daily. 05/22/22  Yes [provider]  chlorthalidone (HYGROTON) 50 MG tablet Take 50 mg by mouth daily. 04/11/22  Yes [provider]  gabapentin (NEURONTIN) 800 MG tablet Take 800 mg by mouth at bedtime. 05/17/22  Yes [provider]  JANUVIA 100 MG tablet Take 100 mg by mouth daily. 05/26/20  Yes [provider]  JARDIANCE 25 MG TABS tablet Take 25 mg by mouth daily. 02/06/20  Yes [provider]  lisinopril (ZESTRIL) 20 MG tablet Take 20 mg by mouth daily. 04/10/22  Yes [provider]  naloxone Karma Greaser) nasal spray 4 mg/0.1 mL SMARTSIG:Both Nares 04/24/22  Yes [provider]  naproxen (NAPROSYN) 500 MG tablet SMARTSIG:1 Tablet(s) By  Mouth Every 12 Hours 04/18/22  Yes [provider]  ondansetron (ZOFRAN-ODT) 8 MG disintegrating tablet Take 8 mg by mouth every 8 (eight) hours as needed. 05/11/22  Yes [provider]  oxyCODONE (ROXICODONE) 15 MG immediate release tablet Take 15 mg by mouth every 4 (four) hours as needed. 05/22/22  Yes [provider]  senna-docusate (SENOKOT-S) 8.6-50 MG tablet Take 1 tablet by mouth 2 (two) times daily. 09/27/20  Yes Domenic Polite, MD  tamsulosin (FLOMAX) 0.4 MG CAPS capsule Take 1 capsule (0.4 mg total) by mouth daily. 11/04/18  Yes Billey Co, MD  testosterone cypionate (DEPOTESTOSTERONE CYPIONATE) 200 MG/ML injection Inject 200 mg into the muscle every 7 (seven) days. 10/07/18  Yes [provider]  zolpidem (AMBIEN) 10 MG tablet Take 10 mg by mouth at bedtime.   Yes [provider]  atorvastatin (LIPITOR) 40 MG tablet Take 1 tablet (40 mg total) by mouth at bedtime. Patient not taking: Reported on 05/28/2022 03/08/17   Arnetha Courser, MD  cyclobenzaprine (FLEXERIL) 5 MG tablet Take by mouth. Patient not taking: Reported on 05/31/2022 09/27/20   [provider]  etodolac (LODINE) 400 MG tablet Take 1 tablet (400 mg total) by mouth 2 (two) times daily. Patient not taking: Reported on 06/05/2022 09/02/21   Johnn Hai, PA-C  HYDROcodone-acetaminophen (NORCO/VICODIN) 5-325 MG tablet Take 1 tablet by mouth every 6 (six) hours as needed for moderate pain. Patient not taking: Reported on 05/13/2022 09/02/21 09/02/22  Johnn Hai, PA-C  meclizine (ANTIVERT) 25 MG tablet Take 25 mg by mouth every 6 (six) hours as needed for dizziness. Patient not taking: Reported on 06/08/2022    [provider]  methocarbamol (ROBAXIN) 500 MG tablet 1-2 tablets every 6 hours prn muscles spasms Patient not taking: Reported on 06/10/2022 09/02/21   Johnn Hai, PA-C  omeprazole (PRILOSEC OTC) 20 MG tablet Take  1 tablet (20 mg total) by  mouth daily for 20 days. 09/27/20 10/17/20  Domenic Polite, MD  oxyCODONE-acetaminophen (PERCOCET) 10-325 MG tablet Take 1 tablet by mouth every 8 (eight) hours as needed for pain. Must last 30 days. 08/01/21 08/31/21  Milinda Pointer, MD     Critical care time: 60 minutes      Donell Beers, [place] Pager [phone] (please enter 7 digits) PCCM Consult Pager [phone] (please enter 7 digits)

## 2022-05-24 NOTE — Transfer of Care (Signed)
Immediate Anesthesia Transfer of Care Note  Patient: Howard Murphy  Procedure(s) Performed: EXPLORATORY LAPAROTOMY  Patient Location: ICU  Anesthesia Type:General  Level of Consciousness: Patient remains intubated per anesthesia plan  Airway & Oxygen Therapy: Patient remains intubated per anesthesia plan and Patient placed on Ventilator (see vital sign flow sheet for setting)  Post-op Assessment: Report given to RN and Post -op Vital signs reviewed and stable  Post vital signs: Reviewed and stable  Last Vitals:  Vitals Value Taken Time  BP 147/88 06/07/2022 1218  Temp    Pulse 158 06/02/2022 1229  Resp 18 06/09/2022 1229  SpO2 97 % 05/19/2022 1229  Vitals shown include unvalidated device data.  Last Pain:  Vitals:   06/09/2022 0913  TempSrc: Temporal  PainSc:       Patients Stated Pain Goal: 0 (44/36/01 6580)  Complications: No notable events documented.

## 2022-05-24 NOTE — Progress Notes (Addendum)
PROGRESS NOTE    Howard Murphy  NTI:[id number] DOB: 26-Nov-1955 DOA: 05/18/2022 PCP: [facility]    Brief Narrative:  66 y.o. male with medical history significant of hypertension, hyperlipidemia, diabetes mellitus, kidney stone, migraine, chronic pain syndrome, who presents with abdominal pain.   Patient states that his abdominal pain started in the early morning at about 5 AM, which is located in the middle abdomen, constant, severe, sharp, 10 out of 10 in severity, radiating to the back.  Associated with nonbilious nonbloody vomiting, multiple times.  No diarrhea, denies fever or chills.  Patient has chronic mild shortness breath which has not changed.  Denies cough, chest pain, symptoms of UTI.  Patient is very uncomfortable and restless due to pain during the interview.  Imaging concerning for acute mesenteric ischemia.  Patient with large SMA thrombus noted on angiography.  Taken to vascular lab with vascular surgery on 12/13.  Intraoperative findings include complete thrombosis of SMA with minimal flow in any branches.  Treated with mechanical thrombectomy and stent placement as well as angioplasty.  Tolerated procedure well.  Continue to have significant pain postoperatively.  Was started on Dilaudid PCA.  Despite this continue to endorse 10 out of 10 abdominal pain.  Lactic acid downtrending.  Seen by general surgery on 12/14.  Taken emergently to operating room for exploratory laparotomy given concern for acute mesenteric ischemia and likely infarcted bowel.  Operative results pending at time of this note.  Addendum: Patient remained mechanically ventilated after operating room.  Care to be assumed by PCCM team.  [facility] hospitalist service to sign off at this time.  Please contact our team when patient stable for transfer back to general medical service.  Discussed with ICU NP Dana    Assessment & Plan:   Principal Problem:   Superior mesenteric artery thrombosis  ([place]) Active Problems:   Acute mesenteric ischemia (HCC)   SIRS (systemic inflammatory response syndrome) (HCC)   Essential hypertension   Chronic pain syndrome   Hyperlipidemia   Elevated lactic acid level   Diabetes mellitus without complication (HCC)   Hypophosphatemia   Hypokalemia   Hypomagnesemia  Superior mesenteric artery thrombosis (HCC)  CTA showed occlusive thrombus/embolus in the SMA extending throughout its length into multiple branches, with limited segmental areas of collateral reconstitution. Two loops of persistent circumferential small bowel wall thickening in the anterior mid abdomen, without evidence of obstruction or pneumatosis. Pt is getting testosterone injection, which likely increased risk of thrombus formation. Dr. Lucky Cowboy of [facility] is urgently consulted by EDP.  Status post angiography with angioplasty and stent placement.  Continue to have pain.  Now status post exploratory laparotomy given concern for bowel infarction on 12/14. Plan: Hold heparin Multimodal pain control Restart antibiotics IV fluids N.p.o.  SIRS (systemic inflammatory response syndrome) (HCC)  WBC 18.7, heart rate 104, RR 24.  No fever.  Lactic acid >9.0.  No source of infection identified.  Chest x-ray negative.  Urinalysis negative.  Likely secondary to SMA thrombus formation. -Patient received 1 dose of Zosyn in ED Plan: Empiric antibiotics restarted as above  Essential hypertension Blood pressure medications on hold for now   Chronic pain syndrome Started on Dilaudid PCA   Hyperlipidemia: Patient is currently not taking Lipitor -Follow-up with PCP   Elevated lactic acid level:  Lactic acid> 9.0.  Likely due to bowel ischemia.  Subsequently downtrending    Diabetes mellitus without complication ([place])  Recent A1c 6.0, well-controlled.  Patient had hyperglycemia, likely stress-induced.  Initiate Semglee 10 units daily.  Sensitive sliding scale   Hypokalemia, hypomagnesemia and  hypophosphatemia Daily labs.  Monitor and replace as necessary.  DVT prophylaxis: SCD Code Status: Full Family Communication: Family member at bedside 12/14 Disposition Plan: Status is: Inpatient Remains inpatient appropriate because: SMA thrombosis, acute mesenteric ischemia.   Level of care: ICU  Consultants:  General surgery  Procedures:  Exploratory laparotomy 12/14  Antimicrobials: Zosyn   Subjective: Seen and examined.  In significant pain this morning.  Endorses 10/10 abdominal pain.  On Dilaudid PCA.  Ill-appearing.  Going to operating room today.  Objective: Vitals:   05/31/2022 2337 05/17/2022 0026 05/18/2022 0333 05/28/2022 0913  BP: (!) 154/106 (!) 157/107 117/74 (!) 118/91  Pulse: (!) 136   (!) 153  Resp: (!) 25 (!) 22 (!) 23 (!) 33  Temp: (!) 100.5 F (38.1 C) 97.8 F (36.6 C) 98 F (36.7 C) 100 F (37.8 C)  TempSrc:  Oral Oral Temporal  SpO2: 97% 100% 94% 94%  Weight:    80.3 kg  Height:    '5\' 7"'$  (1.702 m)    Intake/Output Summary (Last 24 hours) at 05/19/2022 1246 Last data filed at 05/14/2022 1216 Gross per 24 hour  Intake 6249.66 ml  Output 2555 ml  Net 3694.66 ml   Filed Weights   05/16/2022 1052 05/19/2022 0913  Weight: 80.3 kg 80.3 kg    Examination:  General exam: Acutely ill-appearing Respiratory system: Tachypneic.  Increased work of breathing.  Room air Cardiovascular system: Tachycardic, regular rhythm, no murmurs Gastrointestinal system: Distended, diffuse tender to palpation, decreased bowel sounds Central nervous system: Alert and oriented. No focal neurological deficits. Extremities: Symmetric 5 x 5 power. Skin: No rashes, lesions or ulcers Psychiatry: Judgement and insight appear normal. Mood & affect appropriate.     Data Reviewed: I have personally reviewed following labs and imaging studies  CBC: Recent Labs  Lab 06/06/2022 0941 06/04/2022 0217  WBC 18.7* 22.4*  NEUTROABS 15.5*  --   HGB 13.6 12.9*  HCT 41.9 38.7*  MCV  89.0 87.8  PLT 350 716   Basic Metabolic Panel: Recent Labs  Lab 05/14/2022 0940 05/14/2022 0941 05/14/2022 0217  NA  --  135 138  K  --  2.7* 3.5  CL  --  100 104  CO2  --  22 22  GLUCOSE  --  374* 347*  BUN  --  18 21  CREATININE  --  1.00 1.06  CALCIUM  --  9.0 8.1*  MG 1.3*  --  1.7  PHOS 2.3*  --  3.4   GFR: Estimated Creatinine Clearance: 69.6 mL/min (by C-G formula based on SCr of 1.06 mg/dL). Liver Function Tests: Recent Labs  Lab 05/18/2022 0941  AST 33  ALT 22  ALKPHOS 53  BILITOT 0.8  PROT 6.9  ALBUMIN 3.8   Recent Labs  Lab 06/01/2022 0941  LIPASE 43   No results for input(s): "AMMONIA" in the last 168 hours. Coagulation Profile: Recent Labs  Lab 05/31/2022 1450  INR 1.2   Cardiac Enzymes: No results for input(s): "CKTOTAL", "CKMB", "CKMBINDEX", "TROPONINI" in the last 168 hours. BNP (last 3 results) No results for input(s): "PROBNP" in the last 8760 hours. HbA1C: No results for input(s): "HGBA1C" in the last 72 hours. CBG: Recent Labs  Lab 05/22/2022 1529 05/18/2022 1903 05/16/2022 2150 06/09/2022 0921 06/08/2022 0952  GLUCAP 338* 350* 350* 351* 338*   Lipid Profile: No results for input(s): "CHOL", "HDL", "LDLCALC", "TRIG", "CHOLHDL", "LDLDIRECT" in  the last 72 hours. Thyroid Function Tests: No results for input(s): "TSH", "T4TOTAL", "FREET4", "T3FREE", "THYROIDAB" in the last 72 hours. Anemia Panel: No results for input(s): "VITAMINB12", "FOLATE", "FERRITIN", "TIBC", "IRON", "RETICCTPCT" in the last 72 hours. Sepsis Labs: Recent Labs  Lab 06/08/2022 0940 05/17/2022 1240 06/08/2022 1415 05/11/2022 1950 05/25/2022 2137 05/17/2022 0217  PROCALCITON <0.10  --   --   --   --   --   LATICACIDVEN  --    < > >9.0* 6.1* 4.6* 3.4*   < > = values in this interval not displayed.    No results found for this or any previous visit (from the past 240 hour(s)).       Radiology Studies: [place] EKG SITE RITE  Result Date: 05/15/2022 If Site Rite image not attached,  placement could not be confirmed due to current cardiac rhythm.  DG Abd Portable 1V  Result Date: 06/07/2022 CLINICAL DATA:  Abdominal pain and emesis EXAM: PORTABLE ABDOMEN - 1 VIEW COMPARISON:  CTA from earlier in the same day. FINDINGS: Scattered large and small bowel gas is noted. Extensive postsurgical changes are noted in the thoracolumbar spine stable from prior exams. Contrast is noted within the bladder related to the recent CT. No free air is noted. IMPRESSION: No acute abnormality noted. Electronically Signed   By: Inez Catalina M.D.   On: 05/21/2022 02:03   PERIPHERAL VASCULAR CATHETERIZATION  Result Date: 05/13/2022 See surgical note for result.  CT Angio Abd/Pel w/ and/or w/o  Result Date: 05/20/2022 CLINICAL DATA:  Mesenteric ischemia, abdominal pain, elevated lactate EXAM: CTA ABDOMEN AND PELVIS WITHOUT AND WITH CONTRAST TECHNIQUE: Multidetector CT imaging of the abdomen and pelvis was performed using the standard protocol during bolus administration of intravenous contrast. Multiplanar reconstructed images and MIPs were obtained and reviewed to evaluate the vascular anatomy. RADIATION DOSE REDUCTION: This exam was performed according to the departmental dose-optimization program which includes automated exposure control, adjustment of the mA and/or kV according to patient size and/or use of iterative reconstruction technique. CONTRAST:  34m OMNIPAQUE IOHEXOL 350 MG/ML SOLN COMPARISON:  CT 12/22/2019 FINDINGS: VASCULAR Aorta: Minimal calcified aortic atheromatous plaque. No aneurysm, dissection, or stenosis. Celiac: Patent without evidence of aneurysm, dissection, vasculitis or significant stenosis. SMA: There is occlusive thrombus in the SMA extending throughout most of its length from just beyond the origin, with limited segmental areas of collateral reconstitution. The thrombus extends into multiple SMA branches. No definite lesion is identified in the native vessel, suggesting  possible embolic origin. Renals: Single left renal artery, widely patent. Duplicated right renal arteries, superior dominant, both patent. IMA: Patent without evidence of aneurysm, dissection, vasculitis or significant stenosis. Inflow: Minimal nonocclusive atheromatous plaque. No aneurysm or stenosis. Proximal Outflow: Bilateral common femoral and visualized portions of the superficial and profunda femoral arteries are patent without evidence of aneurysm, dissection, vasculitis or significant stenosis. Veins: Patent hepatic veins, portal vein, SM V. no portal venous gas. Some of the mesenteric branches from small-bowel are incompletely opacified probably secondary to low flow due to occluded SMA inflow. Iliac venous system, IVC, and renal veins unremarkable. Review of the MIP images confirms the above findings. NON-VASCULAR Lower chest: Coronary calcifications. No pleural or pericardial effusion. Visualized lung bases clear. Hepatobiliary: No focal liver abnormality is seen. Status post cholecystectomy. No biliary dilatation. Pancreas: Unremarkable. No pancreatic ductal dilatation or surrounding inflammatory changes. Spleen: Normal in size without focal abnormality. Adrenals/Urinary Tract: Adrenal glands are unremarkable. Kidneys are normal, without renal calculi, focal lesion, or  hydronephrosis. Bladder is unremarkable. Stomach/Bowel: Stomach is partially distended by ingested material, unremarkable. Small bowel is nondilated, with 2 loops demonstrating circumferential persistent wall thickening in the anterior mid abdomen (Im109 and 116,Se5 and 12) . No dilatation or pneumatosis. The colon is incompletely distended by gas and fecal material, unremarkable. Lymphatic: No abdominal or pelvic adenopathy. Reproductive: Prostate enlargement with central coarse calcifications. Other: No ascites.  No free air. Musculoskeletal: Small paraumbilical hernia containing only mesenteric fat. Focal skin lesion left anterior  pelvic body wall with regional skin thickening and subcutaneous inflammatory/edematous change. Thoracolumbar fusion hardware extending T10 to across the sacroiliac joints bilaterally, appearing intact without surrounding lucency. No acute finding. IMPRESSION: 1. 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 Urgent vascular consultation recommended. 2. Two loops of persistent circumferential small bowel wall thickening in the anterior mid abdomen, without evidence of obstruction or pneumatosis. Critical Value/emergent results were called by telephone at the time of interpretation on 06/02/2022 at 2:24 pm to provider [facility] , who verbally acknowledged these results. Electronically Signed   By: Lucrezia Europe M.D.   On: 05/21/2022 14:26   CT ABDOMEN PELVIS W CONTRAST  Result Date: 06/06/2022 CLINICAL DATA:  Acute nonlocalized abdominal pain EXAM: CT ABDOMEN AND PELVIS WITH CONTRAST TECHNIQUE: Multidetector CT imaging of the abdomen and pelvis was performed using the standard protocol following bolus administration of intravenous contrast. RADIATION DOSE REDUCTION: This exam was performed according to the departmental dose-optimization program which includes automated exposure control, adjustment of the mA and/or kV according to patient size and/or use of iterative reconstruction technique. CONTRAST:  132m OMNIPAQUE IOHEXOL 300 MG/ML  SOLN COMPARISON:  CT December 22, 2019. FINDINGS: Lower chest: No acute abnormality. Hepatobiliary: No suspicious hepatic lesion. Gallbladder surgically absent. No biliary ductal dilation. Pancreas: No pancreatic ductal dilation or evidence of acute inflammation. Spleen: No splenomegaly or focal splenic lesion. Adrenals/Urinary Tract: Bilateral adrenal glands appear normal. No hydronephrosis. Symmetric excretion of contrast into the collecting systems bilaterally. Probable  radiopaque contrast material layering in the urinary bladder. Stomach/Bowel: Stomach is unremarkable for degree of distension. Duodenal diverticulum. No pathologic dilation of small or large bowel. The appendix is surgically absent. Scattered left-sided colonic diverticulosis without evidence of acute diverticulitis. Vascular/Lymphatic: Aortic atherosclerosis. No pathologically enlarged abdominal or pelvic lymph nodes. Reproductive: Dystrophic calcifications in the prostate gland. Other: Small fat containing supraumbilical ventral hernia. Musculoskeletal: Posterior fusion hardware extending from T10 through the sacrum is well as through the bilateral sacroiliac joints. No acute osseous abnormality. IMPRESSION: 1. No acute abnormality in the abdomen or pelvis. 2. Scattered left-sided colonic diverticulosis without evidence of acute diverticulitis. 3. Small fat containing supraumbilical ventral hernia. 4.  Aortic Atherosclerosis (ICD10-I70.0). Electronically Signed   By: JDahlia BailiffM.D.   On: 06/05/2022 09:38   DG Chest Portable 1 View  Result Date: 05/22/2022 CLINICAL DATA:  Generalized abdominal pain. EXAM: PORTABLE CHEST 1 VIEW COMPARISON:  07/29/2020 FINDINGS: Stable elevation of the right diaphragm. No focal consolidation. No pleural effusion or pneumothorax. Heart and mediastinal contours are unremarkable. No acute osseous abnormality. IMPRESSION: No active disease. Electronically Signed   By: HKathreen DevoidM.D.   On: 05/30/2022 09:19        Scheduled Meds:  Chlorhexidine Gluconate Cloth  6 each Topical Daily   docusate  100 mg Per Tube BID   gabapentin  800 mg Oral QHS   insulin aspart       insulin aspart  0-5 Units Subcutaneous QHS   insulin aspart  0-9 Units Subcutaneous TID WC   insulin glargine-yfgn  10 Units Subcutaneous Daily   lisinopril  20 mg Oral Daily   polyethylene glycol  17 g Per Tube Daily   tamsulosin  0.4 mg Oral Daily   Continuous Infusions:  sodium chloride 125  mL/hr at 06/10/2022 0023   fentaNYL infusion INTRAVENOUS 200 mcg/hr (05/15/2022 1225)   heparin     magnesium sulfate bolus IVPB     piperacillin-tazobactam       LOS: 1 day      Sidney Ace, MD [facility]   If 7PM-7AM, please contact night-coverage  06/05/2022, 12:46 PM

## 2022-05-24 NOTE — Progress Notes (Signed)
[facility]  Daily Progress Note   Subjective  -   Patient seen while in the operating room with Dr. Christian Mate performing laparotomy.  Has areas of irreversible ischemia with large areas of questionable bowel.  Partial resection and abdomen left open.     Objective Vitals:   05/16/2022 1400 05/17/2022 1415 05/20/2022 1430 06/08/2022 1445  BP: 101/72 102/68 96/70 101/75  Pulse: (!) 137 (!) 132 (!) 134 (!) 138  Resp: '16 17 18 16  '$ Temp:      TempSrc:      SpO2: 96% 97% 98% 97%  Weight:      Height:        Intake/Output Summary (Last 24 hours) at 06/04/2022 1531 Last data filed at 05/16/2022 1318 Gross per 24 hour  Intake 4296.55 ml  Output 2805 ml  Net 1491.55 ml     CV  tachycardic GI        bowel as described above.  Laboratory CBC    Component Value Date/Time   WBC 20.4 (H) 05/16/2022 1240   HGB 11.1 (L) 06/01/2022 1240   HGB 16.9 07/15/2015 1113   HCT 33.2 (L) 05/11/2022 1240   HCT 49.4 07/15/2015 1113   PLT 211 05/31/2022 1240   PLT 300 07/15/2015 1113    BMET    Component Value Date/Time   NA 138 05/12/2022 1240   NA 142 03/07/2017 1443   NA 135 (L) 03/23/2014 2002   K 3.7 05/21/2022 1240   K 2.8 (L) 03/23/2014 2002   CL 110 05/29/2022 1240   CL 98 03/23/2014 2002   CO2 24 06/02/2022 1240   CO2 28 03/23/2014 2002   GLUCOSE 301 (H) 05/13/2022 1240   GLUCOSE 181 (H) 03/23/2014 2002   BUN 26 (H) 06/06/2022 1240   BUN 14 03/07/2017 1443   BUN 16 03/23/2014 2002   CREATININE 0.98 05/14/2022 1240   CREATININE 0.95 02/24/2016 1557   CALCIUM 8.3 (L) 05/12/2022 1240   CALCIUM 8.4 (L) 03/23/2014 2002   GFRNONAA >60 05/20/2022 1240   GFRNONAA 87 02/24/2016 1557   GFRAA >60 12/22/2019 0231   GFRAA >89 02/24/2016 1557    Assessment/Planning: POD #1 s/p SMA thrombectomy and stenting  Seen while in OR and clearly had areas of irreversible ischemia and some areas of questionable viability.  Appreciate and agree with Dr. Forest Becker  management at this point.   This point, not much further can be done to perfusion.  No real options for procedural revascularization at this point. Would try to continue anticoagulation as tolerated to help optimize perfusion to the marginal areas of bowel.  Howard Murphy  06/03/2022, 3:31 PM

## 2022-05-24 NOTE — Progress Notes (Signed)
ANTICOAGULATION CONSULT NOTE  Pharmacy Consult for heparin infusion Indication:  Superior mesenteric artery thrombosis  Allergies  Allergen Reactions   Adhesive [Tape] Other (See Comments)    Honeycomb dressing, blisters    Patient Measurements: Height: '5\' 7"'$  (170.2 cm) Weight: 80.3 kg (177 lb 0.5 oz) IBW/kg (Calculated) : 66.1 Heparin Dosing Weight: 80.3 kg  Vital Signs: Temp: 100 F (37.8 C) (12/14 0913) Temp Source: Temporal (12/14 0913) BP: 118/91 (12/14 0913) Pulse Rate: 153 (12/14 0913)  Labs: Recent Labs    06/05/2022 0941 05/20/2022 1450 05/29/2022 2137 05/27/2022 0217 06/07/2022 0922  HGB 13.6  --   --  12.9*  --   HCT 41.9  --   --  38.7*  --   PLT 350  --   --  309  --   APTT  --  22*  --   --   --   LABPROT  --  15.1  --   --   --   INR  --  1.2  --   --   --   HEPARINUNFRC  --   --  0.20* 0.60 0.49  CREATININE 1.00  --   --  1.06  --      Estimated Creatinine Clearance: 69.6 mL/min (by C-G formula based on SCr of 1.06 mg/dL).   Medical History: Past Medical History:  Diagnosis Date   Arthritis    Osteo vs rheumatoid ?   Back ache 05/05/2013   Diabetes mellitus without complication (HCC)    GERD (gastroesophageal reflux disease)    Headache    MIGRAINES   History of kidney stones    Hyperlipemia    Hypertension    Kidney stones    Kidney stones    Nephrolithiasis    Shortness of breath dyspnea     Medications:  No home anticoagulation per pharmacist review  Assessment: 66yo male presented to ED with severe abdominal pain.  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 No chronic anticoagulation discovered PTA  Baseline Labs: aPTT 22s, INR 1.2  Goal of Therapy:  Heparin level 0.3-0.7 units/ml Monitor platelets by anticoagulation protocol: Yes   12/14 @ 0217 HL=0.60 , therapeutic x 1 at 1500un/hr 12/14 @ 0922 HL=0.49, therapeutic x 2  Plan: heparin level is therapeutic x 2 ---Noted heparin infusion HELD at [id number] for  surgical intervention. Will follow up and resume after surgery per surgeon instructions. ---Continue to monitor H&H and platelets  Leandro Berkowitz Rodriguez-Guzman PharmD, BCPS 05/15/2022 10:01 AM

## 2022-05-24 NOTE — Anesthesia Preprocedure Evaluation (Signed)
Anesthesia Evaluation  Patient identified by MRN, date of birth, ID band Patient awake    Reviewed: Allergy & Precautions, H&P , NPO status , Patient's Chart, lab work & pertinent test results, reviewed documented beta blocker date and time   Airway Mallampati: III  TM Distance: >3 FB Neck ROM: full    Dental  (+) Teeth Intact   Pulmonary shortness of breath, at rest and lying   Pulmonary exam normal        Cardiovascular Exercise Tolerance: Poor hypertension, On Medications + Peripheral Vascular Disease  Normal cardiovascular exam Rhythm:regular Rate:Normal     Neuro/Psych  Headaches  Neuromuscular disease  negative psych ROS   GI/Hepatic Neg liver ROS,GERD  Medicated,,  Endo/Other  negative endocrine ROSdiabetes, Poorly Controlled, Type 2    Renal/GU Renal disease  negative genitourinary   Musculoskeletal   Abdominal   Peds  Hematology negative hematology ROS (+)   Anesthesia Other Findings Past Medical History: No date: Arthritis     Comment:  Osteo vs rheumatoid ? 05/05/2013: Back ache No date: Diabetes mellitus without complication (HCC) No date: GERD (gastroesophageal reflux disease) No date: Headache     Comment:  MIGRAINES No date: History of kidney stones No date: Hyperlipemia No date: Hypertension No date: Kidney stones No date: Kidney stones No date: Nephrolithiasis No date: Shortness of breath dyspnea Past Surgical History: No date: APPENDECTOMY 2015: BACK SURGERY     Comment:  x3 08/09/2016: BACK SURGERY 2019: BACK SURGERY; Bilateral 2000: CHOLECYSTECTOMY 04/11/2015: COLONOSCOPY WITH PROPOFOL; N/A     Comment:  Procedure: COLONOSCOPY WITH PROPOFOL;  Surgeon: Manya Silvas, MD;  Location: [facility] ENDOSCOPY;  Service:               Endoscopy;  Laterality: N/A; 08/27/2017: CYSTOSCOPY WITH URETEROSCOPY AND STENT PLACEMENT; Right     Comment:  Procedure: CYSTOSCOPY WITH  URETEROSCOPY AND STENT               PLACEMENT;  Surgeon: Abbie Sons, MD;  Location:               [facility] ORS;  Service: Urology;  Laterality: Right; 04/11/2015: ESOPHAGOGASTRODUODENOSCOPY (EGD) WITH PROPOFOL; N/A     Comment:  Procedure: ESOPHAGOGASTRODUODENOSCOPY (EGD) WITH               PROPOFOL;  Surgeon: Manya Silvas, MD;  Location: [facility]              ENDOSCOPY;  Service: Endoscopy;  Laterality: N/A; 10/22/2020: INCISION AND DRAINAGE; N/A     Comment:  Procedure: INCISION AND DRAINAGE of POSTERIOR LUMBAR;                Surgeon: Meade Maw, MD;  Location: [facility] ORS;                Service: Neurosurgery;  Laterality: N/A; 12/18/2017: LAPAROSCOPIC APPENDECTOMY; N/A     Comment:  Procedure: APPENDECTOMY LAPAROSCOPIC;  Surgeon: Florene Glen, MD;  Location: [facility] ORS;  Service: General;                Laterality: N/A; 09/14/2020: POSTERIOR LUMBAR FUSION 4 LEVEL; N/A     Comment:  Procedure: OPEN T10-PELVIS POSTERIOR SPINAL FUSION               (HARDWARE ALREADY PRESENT AT L2-S2); L1-2 POSTERIOR  SPINAL DECOMPRESSION;  Surgeon: Meade Maw, MD;                Location: [facility] ORS;  Service: Neurosurgery;  Laterality:               N/A; 2016: SHOULDER SURGERY; Right     Comment:  X2 06/2015: SHOULDER SURGERY; Left 06/10/2022: VISCERAL ANGIOGRAPHY; N/A     Comment:  Procedure: VISCERAL ANGIOGRAPHY;  Surgeon: Algernon Huxley,              MD;  Location: [facility];  Service:               Cardiovascular;  Laterality: N/A; BMI    Body Mass Index: 27.73 kg/m     Reproductive/Obstetrics negative OB ROS                             Anesthesia Physical Anesthesia Plan  ASA: 4 and emergent  Anesthesia Plan: General ETT   Post-op Pain Management:    Induction:   PONV Risk Score and Plan:   Airway Management Planned:   Additional Equipment:   Intra-op Plan:   Post-operative Plan:   Informed  Consent: I have reviewed the patients History and Physical, chart, labs and discussed the procedure including the risks, benefits and alternatives for the proposed anesthesia with the patient or authorized representative who has indicated his/her understanding and acceptance.     Dental Advisory Given  Plan Discussed with: CRNA  Anesthesia Plan Comments: (As discussed with family, pt is in extremis with tachypnea and tachycardia with ongoing mesenteric ischemia at high risk for above procedure.  There is a high probability for post op vent support and contingent on findings, pt is at high perioperative risk. They voice understanding. ja)       Anesthesia Quick Evaluation

## 2022-05-24 NOTE — Progress Notes (Signed)
eLink Physician-Brief Progress Note Patient Name: Howard Murphy DOB: Jun 25, 1955 MRN: [id number]   Date of Service  06/04/2022  HPI/Events of Note  Patient s/p exploratory laparotomy for ischemic bowel, now with persistent post-op sinus tachy cardia, fluid balance is about even, patient does not appear to be in pain.  eICU Interventions  Albumin 5 % 500 ml iv fluid bolus x 1 ordered.        Howard Murphy 06/09/2022, 11:44 PM

## 2022-05-24 NOTE — H&P (View-Only) (Signed)
[place] Hospital Day(s): 1.   Interval History: Patient seen and examined. Continues to have significant abdominal pain overnight. Did have fever to 100.57F at 2330. Remains significantly tachycardic to 120-140 bpm. Leukocytosis worsening to 22.4K. Renal function normal; sCr - 1.06. No significant electrolyte derangements. Lactic acidosis is improving; down to 3.4 (peaked at >9.0). He continues to have significant abdominal pain; generalized; this continues to be out of proportion. He is NPO  Review of Systems:  Constitutional: + fever, denied chills  HEENT: denies cough or congestion  Respiratory: denies any shortness of breath  Cardiovascular: denies chest pain or palpitations  Gastrointestinal: + abdominal pain, N/V Musculoskeletal: denies pain, decreased motor or sensation  Vital signs in last 24 hours: [min-max] current  Temp:  [97.5 F (36.4 C)-100.5 F (38.1 C)] 98 F (36.7 C) (12/14 0333) Pulse Rate:  [0-145] 136 (12/13 2337) Resp:  [15-37] 23 (12/14 0333) BP: (117-199)/(60-119) 117/74 (12/14 0333) SpO2:  [90 %-100 %] 94 % (12/14 0333) Weight:  [80.3 kg] 80.3 kg (12/13 1052)     Height: '5\' 7"'$  (170.2 cm) (from 3/23 visit) Weight: 80.3 kg    Intake/Output last 2 shifts:  12/13 0701 - 12/14 0700 In: 4199.7 [I.V.:1949.7; IV Piggyback:2250] Out: 39 [Urine:680; Blood:200]   Physical Exam:  Constitutional: alert, cooperative and no distress  HENT: normocephalic without obvious abnormality  Eyes: PERRL, EOM's grossly intact and symmetric  Respiratory: breathing non-labored at rest; on [place] Cardiovascular: regular rate and sinus rhythm  Gastrointestinal: Abdomen remains markedly distended, still with significant abdominal pain which is diffuse, I am concerned that he is peritonitic.  Musculoskeletal: no edema or wounds, motor and sensation grossly intact, NT    Labs:     Latest Ref Rng & Units 06/06/2022    2:17 AM 05/31/2022     9:41 AM 09/02/2021   10:34 AM  CBC  WBC 4.0 - 10.5 K/uL 22.4  18.7  7.5   Hemoglobin 13.0 - 17.0 g/dL 12.9  13.6  17.0   Hematocrit 39.0 - 52.0 % 38.7  41.9  52.7   Platelets 150 - 400 K/uL 309  350  238       Latest Ref Rng & Units 06/09/2022    2:17 AM 06/10/2022    9:41 AM 09/02/2021   10:34 AM  CMP  Glucose 70 - 99 mg/dL 347  374  151   BUN 8 - 23 mg/dL '21  18  20   '$ Creatinine 0.61 - 1.24 mg/dL 1.06  1.00  0.90   Sodium 135 - 145 mmol/L 138  135  137   Potassium 3.5 - 5.1 mmol/L 3.5  2.7  3.3   Chloride 98 - 111 mmol/L 104  100  101   CO2 22 - 32 mmol/L '22  22  27   '$ Calcium 8.9 - 10.3 mg/dL 8.1  9.0  9.4   Total Protein 6.5 - 8.1 g/dL  6.9    Total Bilirubin 0.3 - 1.2 mg/dL  0.8    Alkaline Phos 38 - 126 U/L  53    AST 15 - 41 U/L  33    ALT 0 - 44 U/L  22       Imaging studies: No new pertinent imaging studies   Assessment/Plan:  66 y.o. 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   - Unfortunately, he continues have significant abdominal pain  with fever and worsening leukocytosis. I am concerned that he has ischemic or infarcted bowel. As such, we will plan to proceed emergently to the OR for laparotomy. I had an extensive discussion with the patient and the family regarding the risks and potential outcomes including need for bowel resection, need for open abdomen, need for multiple procedures, need for mechanical ventilation, and the possibility that the small bowel is no-salvageable, and death. They are understand   - All risks, benefits, and alternatives to above procedure(s) were discussed with the patient and his family, all of his questions were answered to his expressed satisfaction, patient expresses he wishes to proceed, and informed consent was obtained.    - NPO + IVF Resuscitation - Restart IV Abx (Zosyn) - Stop Heparin - Monitor abdominal  examination; on-going bowel function - Pain control prn; antiemetics prn - further management per primary service; we will of course follow    All of the above findings and recommendations were discussed with the patient, patient's family, and the medical team, and all of patient's and family's questions were answered to their expressed satisfaction.  -- Edison Simon, PA-C [facility] 05/15/2022, 7:23 AM M-F: 7am - 4pm

## 2022-05-24 NOTE — Progress Notes (Signed)
ANTICOAGULATION CONSULT NOTE  Pharmacy Consult for heparin infusion Indication:  Superior mesenteric artery thrombosis  Allergies  Allergen Reactions   Adhesive [Tape] Other (See Comments)    Honeycomb dressing, blisters    Patient Measurements: Height: '5\' 7"'$  (170.2 cm) Weight: 80.3 kg (177 lb 0.5 oz) IBW/kg (Calculated) : 66.1 Heparin Dosing Weight: 80.3 kg  Vital Signs: Temp: 99.2 F (37.3 C) (12/14 1230) Temp Source: Oral (12/14 1230) BP: 101/75 (12/14 1445) Pulse Rate: 138 (12/14 1445)  Labs: Recent Labs    06/06/2022 0941 05/21/2022 1450 06/05/2022 2137 05/15/2022 0217 06/05/2022 0922 05/20/2022 1240  HGB 13.6  --   --  12.9*  --  11.1*  HCT 41.9  --   --  38.7*  --  33.2*  PLT 350  --   --  309  --  211  APTT  --  22*  --   --   --   --   LABPROT  --  15.1  --   --   --   --   INR  --  1.2  --   --   --   --   HEPARINUNFRC  --   --  0.20* 0.60 0.49  --   CREATININE 1.00  --   --  1.06  --  0.98  TROPONINIHS  --   --   --   --   --  41*     Estimated Creatinine Clearance: 75.3 mL/min (by C-G formula based on SCr of 0.98 mg/dL).   Medical History: Past Medical History:  Diagnosis Date   Arthritis    Osteo vs rheumatoid ?   Back ache 05/05/2013   Diabetes mellitus without complication (HCC)    GERD (gastroesophageal reflux disease)    Headache    MIGRAINES   History of kidney stones    Hyperlipemia    Hypertension    Kidney stones    Kidney stones    Nephrolithiasis    Shortness of breath dyspnea     Medications:  No home anticoagulation per pharmacist review  Assessment: 66yo male presented to ED with severe abdominal pain.  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 No chronic anticoagulation discovered PTA  Baseline Labs: aPTT 22s, INR 1.2  Goal of Therapy:  Heparin level 0.3-0.7 units/ml Monitor platelets by anticoagulation protocol: Yes   12/14 @ 0217 HL=0.60 , therapeutic x 1 at 1500un/hr 12/14 @ 0922 HL=0.49, therapeutic  x 2  Plan:  - Will resume heparin infusion post-procedure at 1500 units with no bolus. - Will recheck HL in 6 hours after restart - Continue to monitor H&H and platelets  Pearla Dubonnet, PharmD Clinical Pharmacist 06/09/2022 2:57 PM

## 2022-05-24 NOTE — Progress Notes (Signed)
ANTICOAGULATION CONSULT NOTE  Pharmacy Consult for heparin infusion Indication:  Superior mesenteric artery thrombosis  Allergies  Allergen Reactions   Adhesive [Tape] Other (See Comments)    Honeycomb dressing, blisters    Patient Measurements: Height: '5\' 7"'$  (170.2 cm) Weight: 80.3 kg (177 lb 0.5 oz) IBW/kg (Calculated) : 66.1 Heparin Dosing Weight: 80.3 kg  Vital Signs: Temp: 99.2 F (37.3 C) (12/14 1230) Temp Source: Oral (12/14 1230) BP: 101/75 (12/14 1445) Pulse Rate: 138 (12/14 1445)  Labs: Recent Labs    05/11/2022 0941 05/12/2022 1450 05/22/2022 2137 05/21/2022 0217 06/01/2022 0922 06/10/2022 1240  HGB 13.6  --   --  12.9*  --  11.1*  HCT 41.9  --   --  38.7*  --  33.2*  PLT 350  --   --  309  --  211  APTT  --  22*  --   --   --   --   LABPROT  --  15.1  --   --   --   --   INR  --  1.2  --   --   --   --   HEPARINUNFRC  --   --  0.20* 0.60 0.49  --   CREATININE 1.00  --   --  1.06  --  0.98  TROPONINIHS  --   --   --   --   --  41*     Estimated Creatinine Clearance: 75.3 mL/min (by C-G formula based on SCr of 0.98 mg/dL).   Medical History: Past Medical History:  Diagnosis Date   Arthritis    Osteo vs rheumatoid ?   Back ache 05/05/2013   Diabetes mellitus without complication (HCC)    GERD (gastroesophageal reflux disease)    Headache    MIGRAINES   History of kidney stones    Hyperlipemia    Hypertension    Kidney stones    Kidney stones    Nephrolithiasis    Shortness of breath dyspnea     Medications:  No home anticoagulation per pharmacist review  Assessment: 66yo male presented to ED with severe abdominal pain.  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 No chronic anticoagulation discovered PTA  Baseline Labs: aPTT 22s, INR 1.2  Goal of Therapy:  Heparin level 0.3-0.7 units/ml Monitor platelets by anticoagulation protocol: Yes    Plan: heparin level remains therapeutic - continue  heparin infusion at 1500  units/hr  - Will recheck heparin level once daily while therapeutic: next level 12/15 am   - Continue to monitor H&H and platelets  Dallie Piles, PharmD Clinical Pharmacist 05/29/2022 3:50 PM

## 2022-05-24 NOTE — Progress Notes (Addendum)
On call provider NP F[place] notified of uncontrolled abdomen pain despite pain medication administration and request for antiemetic. And HR sustaining in the 150s.

## 2022-05-24 NOTE — Consult Note (Signed)
ANTICOAGULATION CONSULT NOTE  Pharmacy Consult for heparin infusion Indication:  Superior mesenteric artery thrombosis  Allergies  Allergen Reactions   Adhesive [Tape] Other (See Comments)    Honeycomb dressing, blisters    Patient Measurements: Height: '5\' 7"'$  (170.2 cm) (from 3/23 visit) Weight: 80.3 kg (177 lb) IBW/kg (Calculated) : 66.1 Heparin Dosing Weight: 80.3 kg  Vital Signs: Temp: 98 F (36.7 C) (12/14 0333) Temp Source: Oral (12/14 0333) BP: 117/74 (12/14 0333) Pulse Rate: 136 (12/13 2337)  Labs: Recent Labs    06/10/2022 0941 05/13/2022 1450 05/22/2022 2137 06/02/2022 0217  HGB 13.6  --   --  12.9*  HCT 41.9  --   --  38.7*  PLT 350  --   --  309  APTT  --  22*  --   --   LABPROT  --  15.1  --   --   INR  --  1.2  --   --   HEPARINUNFRC  --   --  0.20* 0.60  CREATININE 1.00  --   --  1.06     Estimated Creatinine Clearance: 69.6 mL/min (by C-G formula based on SCr of 1.06 mg/dL).   Medical History: Past Medical History:  Diagnosis Date   Arthritis    Osteo vs rheumatoid ?   Back ache 05/05/2013   Diabetes mellitus without complication (HCC)    GERD (gastroesophageal reflux disease)    Headache    MIGRAINES   History of kidney stones    Hyperlipemia    Hypertension    Kidney stones    Kidney stones    Nephrolithiasis    Shortness of breath dyspnea     Medications:  No home anticoagulation per pharmacist review  Assessment: 66yo male presented to ED with severe abdominal pain.  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 No chronic anticoagulation discovered PTA  Baseline Labs: aPTT 22s, INR 1.2  Goal of Therapy:  Heparin level 0.3-0.7 units/ml Monitor platelets by anticoagulation protocol: Yes   Plan: heparin level is therapeutic x 1 ---Continue heparin infusion rate at 1500 units/hr ---Recheck HL in 6 hr to confirm ---Continue to monitor H&H and platelets  Renda Rolls, PharmD, [facility] 06/07/2022 5:09 AM

## 2022-05-24 NOTE — Interval H&P Note (Signed)
History and Physical Interval Note:  05/22/2022 10:01 AM  Howard Murphy  has presented today for surgery, with the diagnosis of Ischemic Bowel.  The various methods of treatment have been discussed with the patient and family. After consideration of risks, benefits and other options for treatment, the patient has consented to  Procedure(s): EXPLORATORY LAPAROTOMY (N/A) as a surgical intervention.  The patient's history has been reviewed, patient examined, no change in status, stable for surgery.  I have reviewed the patient's chart and labs.  Questions were answered to the patient's satisfaction.     [name]

## 2022-05-24 NOTE — Progress Notes (Signed)
Pt CBG: 351. Dr. Andree Elk notified. Acknowledged. Orders received. See MAR.

## 2022-05-24 NOTE — Progress Notes (Signed)
To whom it may concern.   Debby Freiberg needs to miss work to be with a critically ill family member 05/16/2022. Please excuse him.   Thank you, Saundra Shelling RN

## 2022-05-24 NOTE — Anesthesia Procedure Notes (Signed)
Central Venous Catheter Insertion Performed by: Molli Barrows, MD, Esaw Grandchild, CRNA, CRNA Start/End12/02/2022 10:50 AM, 05/31/2022 11:10 AM Patient location: OR. Preanesthetic checklist: patient identified, IV checked, site marked, risks and benefits discussed, surgical consent, monitors and equipment checked, pre-op evaluation, timeout performed and anesthesia consent Position: Trendelenburg Patient sedated Hand hygiene performed  and Seldinger technique used Catheter size: 8.5 Fr Total catheter length 12. Central line was placed.Double lumen Procedure performed using ultrasound guided technique. Ultrasound Notes:anatomy identified, needle tip was noted to be adjacent to the nerve/plexus identified and no ultrasound evidence of intravascular and/or intraneural injection Attempts: 1 Following insertion, line sutured, dressing applied and Biopatch. Post procedure assessment: blood return through all ports, free fluid flow and no air  Patient tolerated the procedure well with no immediate complications.

## 2022-05-24 NOTE — Op Note (Addendum)
Exploratory laparotomy, resection of portion of jejunum, placement of ABThera wound VAC.  Pre-operative Diagnosis: Ischemic bowel  Post-operative Diagnosis: same.  Surgeon: [name], M.D., FACS  Assistant: Ardath Sax, MD  Anesthesia: General endotracheal  Findings: Dark bloody colored peritoneal fluid, segment of 12 to 15 cm length of proximal jejunum with areas of focal necrosis.  Estimated 18 inches of more distal hemorrhagic small bowel mesentery, with readily viable serosa, pink in color.  Vast majority of the small bowel of slightly ashen to lavender discoloration, of questionable viability in the long-term.  However this is also potentially viable.  The midline incision is 17 cm from cranial-to-caudad; and 10 cm wide prior to placement of the Abthera VAC.   Estimated Blood Loss: 25 mL         Specimens: Portion of jejunum.          Complications: none              Procedure Details  The patient was seen again in the Holding Room. The benefits, complications, treatment options, and expected outcomes were discussed with the patient. The risks of bleeding, infection, recurrence of symptoms, failure to resolve symptoms, unanticipated injury, prosthetic placement, prosthetic infection, any of which could require further surgery were reviewed with the patient. The likelihood of improving the patient's symptoms with return to their baseline status is guarded.  The patient and/or family concurred with the proposed plan, giving informed consent.  The patient was taken to Operating Room, identified and the procedure verified.    Prior to the induction of general anesthesia, A-line and central lines were placed.  VTE prophylaxis was in place.  General endotracheal anesthesia was then administered. After the induction, the patient was positioned in the supine position and the abdomen was prepped with  Chloraprep and draped in the sterile fashion.  A Time Out was held and the above  information confirmed.  A midline incision was made, to the left of the umbilical hernia defect and carried through the linea alba, carefully opening the peritoneal cavity.  Hemostasis maintained with electrocautery. Dark bloody discolored peritoneal fluid was obtained. The above findings noted.  It was felt prudent to resect the frankly necrotic segment of jejunum.  This was completed utilizing GIA staplers both proximally and distally, and utilizing the LigaSure to divide the short segment of mesentery.  We evaluated the small bowel twice prior to proceeding with resection trying to determine what other areas would have to be resected.  The vast majority was of questionable viability but clearly undeclared at this point. We felt it prudent to return for second look laparotomy, and therefore not to close his abdomen anticipating further swelling.  We irrigated the abdomen with several volumes of normal saline solution.  I then cut and ABThera intra-abdominal draping to size and placed it within the abdominal cavity.  There was some fatty adhesions of the sigmoid colon to the left lower quadrant keeping the ABThera drape from splaying out nicely, these were lysed with electrocautery.  The hole in the drape was cut centrally to communicate negative pressure to the blue football shaped sponge that filled the midline wound.  Draping was continued over this blue sponge, a hole was created for the suction port.  ABThera settings of 125 mmHg were applied and showed no evidence of leak. Patient tolerated procedure well.  Please note that Dr. Hampton Abbot was scrubbed in for the entirety of the case.  His assistance was critical due to the complexity of  the case, and he assisted with the exposure, resection, and wound VAC placement.       [name] M.D., [facility] 05/29/2022 12:25 PM

## 2022-05-24 NOTE — Progress Notes (Signed)
PCA education provided to patient and spouse. Both parties verbalized understanding at this time.

## 2022-05-24 NOTE — Anesthesia Procedure Notes (Signed)
Procedure Name: Intubation Date/Time: 05/29/2022 11:32 AM  Performed by: Gigi Gin, CRNAPre-anesthesia Checklist: Patient identified, Emergency Drugs available, Suction available, Patient being monitored and Timeout performed Patient Re-evaluated:Patient Re-evaluated prior to induction Oxygen Delivery Method: Circle system utilized Preoxygenation: Pre-oxygenation with 100% oxygen Induction Type: IV induction Laryngoscope Size: 4 and McGraph Grade View: Grade I Tube type: Oral Tube size: 7.5 mm Number of attempts: 1 Airway Equipment and Method: Stylet

## 2022-05-24 NOTE — Progress Notes (Signed)
[facility] Day(s): 1.   Interval History: Patient seen and examined. Continues to have significant abdominal pain overnight. Did have fever to 100.5F at 2330. Remains significantly tachycardic to 120-140 bpm. Leukocytosis worsening to 22.4K. Renal function normal; sCr - 1.06. No significant electrolyte derangements. Lactic acidosis is improving; down to 3.4 (peaked at >9.0). He continues to have significant abdominal pain; generalized; this continues to be out of proportion. He is NPO  Review of Systems:  Constitutional: + fever, denied chills  HEENT: denies cough or congestion  Respiratory: denies any shortness of breath  Cardiovascular: denies chest pain or palpitations  Gastrointestinal: + abdominal pain, N/V Musculoskeletal: denies pain, decreased motor or sensation  Vital signs in last 24 hours: [min-max] current  Temp:  [97.5 F (36.4 C)-100.5 F (38.1 C)] 98 F (36.7 C) (12/14 0333) Pulse Rate:  [0-145] 136 (12/13 2337) Resp:  [15-37] 23 (12/14 0333) BP: (117-199)/(60-119) 117/74 (12/14 0333) SpO2:  [90 %-100 %] 94 % (12/14 0333) Weight:  [80.3 kg] 80.3 kg (12/13 1052)     Height: '5\' 7"'$  (170.2 cm) (from 3/23 visit) Weight: 80.3 kg    Intake/Output last 2 shifts:  12/13 0701 - 12/14 0700 In: 4199.7 [I.V.:1949.7; IV Piggyback:2250] Out: 58 [Urine:680; Blood:200]   Physical Exam:  Constitutional: alert, cooperative and no distress  HENT: normocephalic without obvious abnormality  Eyes: PERRL, EOM's grossly intact and symmetric  Respiratory: breathing non-labored at rest; on [place] Cardiovascular: regular rate and sinus rhythm  Gastrointestinal: Abdomen remains markedly distended, still with significant abdominal pain which is diffuse, I am concerned that he is peritonitic.  Musculoskeletal: no edema or wounds, motor and sensation grossly intact, NT    Labs:     Latest Ref Rng & Units 05/14/2022    2:17 AM 06/01/2022     9:41 AM 09/02/2021   10:34 AM  CBC  WBC 4.0 - 10.5 K/uL 22.4  18.7  7.5   Hemoglobin 13.0 - 17.0 g/dL 12.9  13.6  17.0   Hematocrit 39.0 - 52.0 % 38.7  41.9  52.7   Platelets 150 - 400 K/uL 309  350  238       Latest Ref Rng & Units 05/27/2022    2:17 AM 05/31/2022    9:41 AM 09/02/2021   10:34 AM  CMP  Glucose 70 - 99 mg/dL 347  374  151   BUN 8 - 23 mg/dL '21  18  20   '$ Creatinine 0.61 - 1.24 mg/dL 1.06  1.00  0.90   Sodium 135 - 145 mmol/L 138  135  137   Potassium 3.5 - 5.1 mmol/L 3.5  2.7  3.3   Chloride 98 - 111 mmol/L 104  100  101   CO2 22 - 32 mmol/L '22  22  27   '$ Calcium 8.9 - 10.3 mg/dL 8.1  9.0  9.4   Total Protein 6.5 - 8.1 g/dL  6.9    Total Bilirubin 0.3 - 1.2 mg/dL  0.8    Alkaline Phos 38 - 126 U/L  53    AST 15 - 41 U/L  33    ALT 0 - 44 U/L  22       Imaging studies: No new pertinent imaging studies   Assessment/Plan:  66 y.o. 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   - Unfortunately, he continues have significant abdominal pain  with fever and worsening leukocytosis. I am concerned that he has ischemic or infarcted bowel. As such, we will plan to proceed emergently to the OR for laparotomy. I had an extensive discussion with the patient and the family regarding the risks and potential outcomes including need for bowel resection, need for open abdomen, need for multiple procedures, need for mechanical ventilation, and the possibility that the small bowel is no-salvageable, and death. They are understand   - All risks, benefits, and alternatives to above procedure(s) were discussed with the patient and his family, all of his questions were answered to his expressed satisfaction, patient expresses he wishes to proceed, and informed consent was obtained.    - NPO + IVF Resuscitation - Restart IV Abx (Zosyn) - Stop Heparin - Monitor abdominal  examination; on-going bowel function - Pain control prn; antiemetics prn - further management per primary service; we will of course follow    All of the above findings and recommendations were discussed with the patient, patient's family, and the medical team, and all of patient's and family's questions were answered to their expressed satisfaction.  -- Edison Simon, PA-C [facility] 05/27/2022, 7:23 AM M-F: 7am - 4pm

## 2022-05-25 ENCOUNTER — Other Ambulatory Visit: Payer: Self-pay

## 2022-05-25 ENCOUNTER — Inpatient Hospital Stay
Admit: 2022-05-25 | Discharge: 2022-05-25 | Disposition: A | Discharge status: Home / Self Care | Payer: Medicare HMO | Attending: Pulmonary Disease | Admitting: Pulmonary Disease

## 2022-05-25 ENCOUNTER — Inpatient Hospital Stay: Payer: Medicare HMO | Admitting: Certified Registered"

## 2022-05-25 ENCOUNTER — Encounter: Admission: EM | Disposition: E | Payer: Self-pay | Source: Home / Self Care | Attending: Pulmonary Disease

## 2022-05-25 DIAGNOSIS — Z7189 Other specified counseling: Secondary | ICD-10-CM | POA: Diagnosis not present

## 2022-05-25 DIAGNOSIS — J9601 Acute respiratory failure with hypoxia: Secondary | ICD-10-CM

## 2022-05-25 DIAGNOSIS — K55059 Acute (reversible) ischemia of intestine, part and extent unspecified: Secondary | ICD-10-CM | POA: Diagnosis not present

## 2022-05-25 HISTORY — PX: LAPAROTOMY: SHX154

## 2022-05-25 LAB — COMPREHENSIVE METABOLIC PANEL
ALT: 13 U/L (ref 0–44)
AST: 24 U/L (ref 15–41)
Albumin: 3 g/dL — ABNORMAL LOW (ref 3.5–5.0)
Alkaline Phosphatase: 31 U/L — ABNORMAL LOW (ref 38–126)
Anion gap: 7 (ref 5–15)
BUN: 25 mg/dL — ABNORMAL HIGH (ref 8–23)
CO2: 24 mmol/L (ref 22–32)
Calcium: 7.8 mg/dL — ABNORMAL LOW (ref 8.9–10.3)
Chloride: 111 mmol/L (ref 98–111)
Creatinine, Ser: 0.86 mg/dL (ref 0.61–1.24)
GFR, Estimated: >60 mL/min (ref >60)
Glucose, Bld: 200 mg/dL — ABNORMAL HIGH (ref 70–99)
Potassium: 3.8 mmol/L (ref 3.5–5.1)
Sodium: 142 mmol/L (ref 135–145)
Total Bilirubin: 1 mg/dL (ref 0.3–1.2)
Total Protein: 5.1 g/dL — ABNORMAL LOW (ref 6.5–8.1)

## 2022-05-25 LAB — CBC WITH DIFFERENTIAL/PLATELET
Abs Immature Granulocytes: 0.09 10*3/uL — ABNORMAL HIGH (ref 0.00–0.07)
Basophils Absolute: 0.1 10*3/uL (ref 0.0–0.1)
Basophils Relative: 0 %
Eosinophils Absolute: 0.1 10*3/uL (ref 0.0–0.5)
Eosinophils Relative: 1 %
HCT: 29.1 % — ABNORMAL LOW (ref 39.0–52.0)
Hemoglobin: 9.3 g/dL — ABNORMAL LOW (ref 13.0–17.0)
Immature Granulocytes: 1 %
Lymphocytes Relative: 8 %
Lymphs Abs: 1.5 10*3/uL (ref 0.7–4.0)
MCH: 29 pg (ref 26.0–34.0)
MCHC: 32 g/dL (ref 30.0–36.0)
MCV: 90.7 fL (ref 80.0–100.0)
Monocytes Absolute: 1.1 10*3/uL — ABNORMAL HIGH (ref 0.1–1.0)
Monocytes Relative: 6 %
Neutro Abs: 15.5 10*3/uL — ABNORMAL HIGH (ref 1.7–7.7)
Neutrophils Relative %: 84 %
Platelets: 198 10*3/uL (ref 150–400)
RBC: 3.21 MIL/uL — ABNORMAL LOW (ref 4.22–5.81)
RDW: 14.5 % (ref 11.5–15.5)
WBC: 18.3 10*3/uL — ABNORMAL HIGH (ref 4.0–10.5)
nRBC: 0 % (ref 0.0–0.2)

## 2022-05-25 LAB — PROCALCITONIN: Procalcitonin: 7.84 ng/mL

## 2022-05-25 LAB — HEPARIN LEVEL (UNFRACTIONATED): Heparin Unfractionated: 0.43 IU/mL (ref 0.30–0.70)

## 2022-05-25 LAB — GLUCOSE, CAPILLARY
Glucose-Capillary: 183 mg/dL — ABNORMAL HIGH (ref 70–99)
Glucose-Capillary: 190 mg/dL — ABNORMAL HIGH (ref 70–99)
Glucose-Capillary: 181 mg/dL — ABNORMAL HIGH (ref 70–99)

## 2022-05-25 LAB — MAGNESIUM: Magnesium: 2 mg/dL (ref 1.7–2.4)

## 2022-05-25 LAB — SURGICAL PATHOLOGY

## 2022-05-25 LAB — PHOSPHORUS: Phosphorus: 1.6 mg/dL — ABNORMAL LOW (ref 2.5–4.6)

## 2022-05-25 LAB — TRIGLYCERIDES: Triglycerides: 227 mg/dL — ABNORMAL HIGH (ref <150)

## 2022-05-25 SURGERY — LAPAROTOMY, EXPLORATORY
Anesthesia: General

## 2022-05-25 MED ORDER — MIDAZOLAM-SODIUM CHLORIDE 100-0.9 MG/100ML-% IV SOLN
0.5000 mg/h | INTRAVENOUS | Status: DC
  Administered 2022-05-25: 2 mg/h via INTRAVENOUS
  Filled 2022-05-25: qty 100

## 2022-05-25 MED ORDER — ACETAMINOPHEN 650 MG RE SUPP: 650.0000 mg | Freq: Four times a day (QID) | RECTAL | Status: DC | PRN

## 2022-05-25 MED ORDER — GLYCOPYRROLATE 1 MG PO TABS: 1.0000 mg | ORAL_TABLET | ORAL | Status: DC | PRN

## 2022-05-25 MED ORDER — ROCURONIUM BROMIDE 100 MG/10ML IV SOLN
INTRAVENOUS | Status: DC | PRN
  Administered 2022-05-25: 40 mg via INTRAVENOUS
  Administered 2022-05-25: 60 mg via INTRAVENOUS

## 2022-05-25 MED ORDER — 0.9 % SODIUM CHLORIDE (POUR BTL) OPTIME
TOPICAL | Status: DC | PRN
  Administered 2022-05-25: 100 mL

## 2022-05-25 MED ORDER — PHENYLEPHRINE HCL (PRESSORS) 10 MG/ML IV SOLN
INTRAVENOUS | Status: DC | PRN
  Administered 2022-05-25 (×4): 200 ug via INTRAVENOUS

## 2022-05-25 MED ORDER — LORAZEPAM 2 MG/ML IJ SOLN: 2.0000 mg | INTRAMUSCULAR | Status: DC | PRN

## 2022-05-25 MED ORDER — GLYCOPYRROLATE 0.2 MG/ML IJ SOLN
0.2000 mg | INTRAMUSCULAR | Status: DC | PRN
  Filled 2022-05-25: qty 1

## 2022-05-25 MED ORDER — MIDAZOLAM HCL 2 MG/2ML IJ SOLN
INTRAMUSCULAR | Status: AC
  Filled 2022-05-25: qty 2

## 2022-05-25 MED ORDER — PROCHLORPERAZINE EDISYLATE 10 MG/2ML IJ SOLN: 10.0000 mg | Freq: Four times a day (QID) | INTRAMUSCULAR | Status: DC | PRN

## 2022-05-25 MED ORDER — PHENYLEPHRINE HCL-NACL 20-0.9 MG/250ML-% IV SOLN
INTRAVENOUS | Status: AC
  Filled 2022-05-25: qty 250

## 2022-05-25 MED ORDER — SODIUM CHLORIDE 0.9 % IV SOLN: INTRAVENOUS | Status: DC

## 2022-05-25 MED ORDER — FENTANYL BOLUS VIA INFUSION: 100.0000 ug | INTRAVENOUS | Status: DC | PRN

## 2022-05-25 MED ORDER — ALBUMIN HUMAN 5 % IV SOLN: INTRAVENOUS | Status: DC | PRN

## 2022-05-25 MED ORDER — KETAMINE HCL 50 MG/5ML IJ SOSY
PREFILLED_SYRINGE | INTRAMUSCULAR | Status: AC
  Filled 2022-05-25: qty 5

## 2022-05-25 MED ORDER — FENTANYL CITRATE (PF) 100 MCG/2ML IJ SOLN
INTRAMUSCULAR | Status: DC | PRN
  Administered 2022-05-25: 100 ug via INTRAVENOUS

## 2022-05-25 MED ORDER — ALBUMIN HUMAN 5 % IV SOLN
INTRAVENOUS | Status: AC
  Filled 2022-05-25: qty 250

## 2022-05-25 MED ORDER — HALOPERIDOL LACTATE 5 MG/ML IJ SOLN: 2.5000 mg | INTRAMUSCULAR | Status: DC | PRN

## 2022-05-25 MED ORDER — PROCHLORPERAZINE MALEATE 10 MG PO TABS: 10.0000 mg | ORAL_TABLET | Freq: Four times a day (QID) | ORAL | Status: DC | PRN

## 2022-05-25 MED ORDER — ACETAMINOPHEN 10 MG/ML IV SOLN
1000.0000 mg | Freq: Four times a day (QID) | INTRAVENOUS | Status: AC | PRN
  Administered 2022-05-26: 1000 mg via INTRAVENOUS
  Filled 2022-05-25: qty 100

## 2022-05-25 MED ORDER — MORPHINE SULFATE (PF) 4 MG/ML IV SOLN
4.0000 mg | INTRAVENOUS | Status: DC | PRN
  Administered 2022-05-26: 4 mg via INTRAVENOUS
  Filled 2022-05-25: qty 1

## 2022-05-25 MED ORDER — DIPHENHYDRAMINE HCL 50 MG/ML IJ SOLN: 25.0000 mg | INTRAMUSCULAR | Status: DC | PRN

## 2022-05-25 MED ORDER — GLYCOPYRROLATE 0.2 MG/ML IJ SOLN
0.2000 mg | INTRAMUSCULAR | Status: DC | PRN
  Administered 2022-05-26 (×2): 0.2 mg via INTRAVENOUS
  Filled 2022-05-25: qty 1

## 2022-05-25 MED ORDER — GABAPENTIN 400 MG PO CAPS: 800.0000 mg | ORAL_CAPSULE | Freq: Every day | ORAL | Status: DC

## 2022-05-25 MED ORDER — PROPOFOL 10 MG/ML IV BOLUS
INTRAVENOUS | Status: AC
  Filled 2022-05-25: qty 20

## 2022-05-25 MED ORDER — FENTANYL CITRATE (PF) 100 MCG/2ML IJ SOLN
INTRAMUSCULAR | Status: AC
  Filled 2022-05-25: qty 2

## 2022-05-25 MED ORDER — MIDAZOLAM HCL 2 MG/2ML IJ SOLN
INTRAMUSCULAR | Status: DC | PRN
  Administered 2022-05-25: 2 mg via INTRAVENOUS

## 2022-05-25 MED ORDER — ACETAMINOPHEN 325 MG PO TABS: 650.0000 mg | ORAL_TABLET | Freq: Four times a day (QID) | ORAL | Status: DC | PRN

## 2022-05-25 MED ORDER — PHENYLEPHRINE HCL (PRESSORS) 10 MG/ML IV SOLN
INTRAVENOUS | Status: AC
  Filled 2022-05-25: qty 1

## 2022-05-25 MED ORDER — POLYVINYL ALCOHOL 1.4 % OP SOLN: 1.0000 [drp] | Freq: Four times a day (QID) | OPHTHALMIC | Status: DC | PRN

## 2022-05-25 MED ORDER — BUPIVACAINE-EPINEPHRINE (PF) 0.25% -1:200000 IJ SOLN
INTRAMUSCULAR | Status: AC
  Filled 2022-05-25: qty 30

## 2022-05-25 MED ORDER — PROCHLORPERAZINE 25 MG RE SUPP: 25.0000 mg | Freq: Two times a day (BID) | RECTAL | Status: DC | PRN

## 2022-05-25 MED ORDER — FENTANYL 2500MCG IN NS 250ML (10MCG/ML) PREMIX INFUSION
0.0000 ug/h | INTRAVENOUS | Status: DC
  Administered 2022-05-26: 250 ug/h via INTRAVENOUS
  Filled 2022-05-25: qty 250

## 2022-05-25 MED ORDER — POTASSIUM PHOSPHATES 15 MMOLE/5ML IV SOLN
30.0000 mmol | Freq: Once | INTRAVENOUS | Status: AC
  Administered 2022-05-25: 30 mmol via INTRAVENOUS
  Filled 2022-05-25: qty 10

## 2022-05-25 MED ORDER — KETAMINE HCL 10 MG/ML IJ SOLN
INTRAMUSCULAR | Status: DC | PRN
  Administered 2022-05-25: 50 mg via INTRAVENOUS

## 2022-05-25 SURGICAL SUPPLY — 14 items
DRAPE C-SECTION (MISCELLANEOUS) ×1 IMPLANT
ELECT REM PT RETURN 9FT ADLT (ELECTROSURGICAL) ×1
ELECTRODE REM PT RTRN 9FT ADLT (ELECTROSURGICAL) ×1 IMPLANT
GLOVE ORTHO TXT STRL SZ7.5 (GLOVE) ×2 IMPLANT
GOWN STRL REUS W/ TWL LRG LVL3 (GOWN DISPOSABLE) ×2 IMPLANT
GOWN STRL REUS W/ TWL XL LVL3 (GOWN DISPOSABLE) ×2 IMPLANT
GOWN STRL REUS W/TWL LRG LVL3 (GOWN DISPOSABLE) ×2
GOWN STRL REUS W/TWL XL LVL3 (GOWN DISPOSABLE) ×2
MANIFOLD NEPTUNE II (INSTRUMENTS) ×1 IMPLANT
NS IRRIG 1000ML POUR BTL (IV SOLUTION) ×1 IMPLANT
PACK BASIN MAJOR ARMC (MISCELLANEOUS) ×1 IMPLANT
PAD ABD DERMACEA PRESS 5X9 (GAUZE/BANDAGES/DRESSINGS) IMPLANT
SUT ETHILON 2 0 FSLX (SUTURE) IMPLANT
TRAP FLUID SMOKE EVACUATOR (MISCELLANEOUS) ×1 IMPLANT

## 2022-05-25 NOTE — Consult Note (Signed)
Consultation Note Date: 05/21/2022   Patient Name: Howard Murphy  DOB: 10/10/1955  MRN: [id number]  Age / Sex: 66 y.o., male  [facility]P: [facility] Referring Physician: Jeani Hawking, MD  Reason for Consultation: Establishing goals of care  HPI/Patient Profile: Patient states that his abdominal pain started in the early morning at about 5 AM, which is located in the middle abdomen, constant, severe, sharp, 10 out of 10 in severity, radiating to the back.  Associated with nonbilious nonbloody vomiting, multiple times.  No diarrhea, denies fever or chills.  Patient has chronic mild shortness breath which has not changed.  Denies cough, chest pain, symptoms of UTI.  Patient is very uncomfortable and restless due to pain during the interview.   Clinical Assessment and Goals of Care: Message from secretary stating that CCM would like consult as soon as possible.  Notes reviewed.  Spoke with charge nurse, and spoke with surgery service. We discussed interventions and timelines of intervention and surgery. Surgery service states that they have spoken with the family in great detail and have advised the family of the extent of injury and poor prognosis.    Spoke with patient's son and daughter, and they have questions regarding the timing of procedures, and discuss his arrival to the hospital.  Offered to have vascular surgery to reach out to them to answer questions.  They state at this time they do not wish to speak to any teams, as caring for their father is most important at this time.  They discussed that they have been advised that patient's bowel status is not survivable, and discussed the shock that he is dying.  Son states that he had said his goodbyes to him and that it was as if patient knew that he was going to die.  Inquired as to their wishes on care moving forward.  They state that they want him  to be comfortable and not have any pain.  We discussed extubation to comfort care.  Discussed what comfort care would look like.  We discussed having family come in to see patient and for the family to shift patient to comfort care when they are ready.    SUMMARY OF RECOMMENDATIONS   Spoke with CCM, RN, charge nurse.  Family to switch to comfort care when they are ready.  Prognosis:  Hospital death       Primary Diagnoses: Present on Admission:  Superior mesenteric artery thrombosis (HCC)  SIRS (systemic inflammatory response syndrome) (HCC)  Chronic pain syndrome  Essential hypertension  Hyperlipidemia  Hypokalemia  Hypomagnesemia  Elevated lactic acid level  Acute mesenteric ischemia (HCC)  Hypophosphatemia   I have reviewed the medical record, interviewed the patient and family, and examined the patient. The following aspects are pertinent.  Past Medical History:  Diagnosis Date   Arthritis    Osteo vs rheumatoid ?   Back ache 05/05/2013   Diabetes mellitus without complication (HCC)    GERD (gastroesophageal reflux disease)    Headache  MIGRAINES   History of kidney stones    Hyperlipemia    Hypertension    Kidney stones    Kidney stones    Nephrolithiasis    Shortness of breath dyspnea    Social History   Socioeconomic History   Marital status: Single    Spouse name: Not on file   Number of children: Not on file   Years of education: Not on file   Highest education level: Not on file  Occupational History   Not on file  Tobacco Use   Smoking status: Never   Smokeless tobacco: Former    Types: Chew    Quit date: 01/18/2013  Vaping Use   Vaping Use: Never used  Substance and Sexual Activity   Alcohol use: No   Drug use: No   Sexual activity: Yes  Other Topics Concern   Not on file  Social History Narrative   Not on file   Social Determinants of Health   Financial Resource Strain: Not on file  Food Insecurity: Not on file  Transportation  Needs: Not on file  Physical Activity: Not on file  Stress: Not on file  Social Connections: Not on file   Family History  Problem Relation Age of Onset   Cancer Mother        Breast CA   Stroke Father    Heart disease Father    Cancer Sister    Cancer Maternal Grandmother    Cancer Sister    Bladder Cancer Neg Hx    Prostate cancer Neg Hx    Kidney cancer Neg Hx    Scheduled Meds:  Chlorhexidine Gluconate Cloth  6 each Topical Daily   Continuous Infusions:  sodium chloride     fentaNYL infusion INTRAVENOUS     propofol (DIPRIVAN) infusion 35 mcg/kg/min (06/04/2022 0642)   PRN Meds:.acetaminophen, diphenhydrAMINE, fentaNYL, glycopyrrolate **OR** glycopyrrolate **OR** glycopyrrolate, haloperidol lactate, LORazepam, ondansetron (ZOFRAN) IV, mouth rinse, polyvinyl alcohol, [DISCONTINUED] prochlorperazine **OR** prochlorperazine **OR** prochlorperazine Medications Prior to Admission:  Prior to Admission medications   Medication Sig Start Date End Date Taking? Authorizing Provider  acetaminophen (TYLENOL) 500 MG tablet Take 2 tablets (1,000 mg total) by mouth every 6 (six) hours. 09/17/20  Yes Meade Maw, MD  celecoxib (CELEBREX) 200 MG capsule Take 200 mg by mouth 2 (two) times daily. 05/22/22  Yes [provider]  chlorthalidone (HYGROTON) 50 MG tablet Take 50 mg by mouth daily. 04/11/22  Yes [provider]  gabapentin (NEURONTIN) 800 MG tablet Take 800 mg by mouth at bedtime. 05/17/22  Yes [provider]  JANUVIA 100 MG tablet Take 100 mg by mouth daily. 05/26/20  Yes [provider]  JARDIANCE 25 MG TABS tablet Take 25 mg by mouth daily. 02/06/20  Yes [provider]  lisinopril (ZESTRIL) 20 MG tablet Take 20 mg by mouth daily. 04/10/22  Yes [provider]  naloxone Karma Greaser) nasal spray 4 mg/0.1 mL SMARTSIG:Both Nares 04/24/22  Yes [provider]  naproxen (NAPROSYN) 500 MG tablet SMARTSIG:1 Tablet(s) By Mouth  Every 12 Hours 04/18/22  Yes [provider]  ondansetron (ZOFRAN-ODT) 8 MG disintegrating tablet Take 8 mg by mouth every 8 (eight) hours as needed. 05/11/22  Yes [provider]  oxyCODONE (ROXICODONE) 15 MG immediate release tablet Take 15 mg by mouth every 4 (four) hours as needed. 05/22/22  Yes [provider]  senna-docusate (SENOKOT-S) 8.6-50 MG tablet Take 1 tablet by mouth 2 (two) times daily. 09/27/20  Yes Broadus John,  Jacinta Shoe, MD  tamsulosin (FLOMAX) 0.4 MG CAPS capsule Take 1 capsule (0.4 mg total) by mouth daily. 11/04/18  Yes Billey Co, MD  testosterone cypionate (DEPOTESTOSTERONE CYPIONATE) 200 MG/ML injection Inject 200 mg into the muscle every 7 (seven) days. 10/07/18  Yes [provider]  zolpidem (AMBIEN) 10 MG tablet Take 10 mg by mouth at bedtime.   Yes [provider]  atorvastatin (LIPITOR) 40 MG tablet Take 1 tablet (40 mg total) by mouth at bedtime. Patient not taking: Reported on 05/31/2022 03/08/17   Arnetha Courser, MD  cyclobenzaprine (FLEXERIL) 5 MG tablet Take by mouth. Patient not taking: Reported on 05/17/2022 09/27/20   [provider]  etodolac (LODINE) 400 MG tablet Take 1 tablet (400 mg total) by mouth 2 (two) times daily. Patient not taking: Reported on 05/20/2022 09/02/21   Johnn Hai, PA-C  HYDROcodone-acetaminophen (NORCO/VICODIN) 5-325 MG tablet Take 1 tablet by mouth every 6 (six) hours as needed for moderate pain. Patient not taking: Reported on 05/29/2022 09/02/21 09/02/22  Johnn Hai, PA-C  meclizine (ANTIVERT) 25 MG tablet Take 25 mg by mouth every 6 (six) hours as needed for dizziness. Patient not taking: Reported on 05/11/2022    [provider]  methocarbamol (ROBAXIN) 500 MG tablet 1-2 tablets every 6 hours prn muscles spasms Patient not taking: Reported on 06/04/2022 09/02/21   Johnn Hai, PA-C  omeprazole (PRILOSEC OTC) 20 MG tablet Take 1 tablet (20 mg total) by mouth  daily for 20 days. 09/27/20 10/17/20  Domenic Polite, MD  oxyCODONE-acetaminophen (PERCOCET) 10-325 MG tablet Take 1 tablet by mouth every 8 (eight) hours as needed for pain. Must last 30 days. 08/01/21 08/31/21  Milinda Pointer, MD   Allergies  Allergen Reactions   Adhesive [Tape] Other (See Comments)    Honeycomb dressing, blisters   Review of Systems  Unable to perform ROS   Physical Exam Constitutional:      Comments: Eyes closed  Pulmonary:     Comments: On ventilator    Vital Signs: BP (!) 143/88   Pulse (!) 124   Temp 99.7 F (37.6 C)   Resp 17   Ht '5\' 7"'$  (1.702 m)   Wt 80.3 kg   SpO2 96%   BMI 27.73 kg/m  Pain Scale: CPOT POSS *See Group Information*: 1-Acceptable,Awake and alert Pain Score: 9    SpO2: SpO2: 96 % O2 Device:SpO2: 96 % O2 Flow Rate: .O2 Flow Rate (L/min): 3 L/min  IO: Intake/output summary:  Intake/Output Summary (Last 24 hours) at 05/18/2022 1644 Last data filed at 05/18/2022 1401 Gross per 24 hour  Intake 3969.08 ml  Output 2560 ml  Net 1409.08 ml    LBM:   Baseline Weight: Weight: 80.3 kg Most recent weight: Weight: 80.3 kg       Signed by: Asencion Gowda, NP   Please contact Palliative Medicine Team phone at [phone] for questions and concerns.  For individual provider: See Shea Evans

## 2022-05-25 NOTE — Progress Notes (Signed)
[facility] Day(s): 2.   Post op day(s): 1 Day Post-Op.   Interval History:  Patient seen and examined Febrile again overnight; T-max 102.64F at 1700 He is off vasopressors this  His leukocytosis is actually improving; 18.3K Hgb to 9.3 Renal function is normal; sCr - 0.86; UO - 1110 ccs Hypophosphatemia to 1.6 NGT with 450 ccs out He continues on Zosyn Plan to return to the OR today   Vital signs in last 24 hours: [min-max] current  Temp:  [99.2 F (37.3 C)-102.5 F (39.2 C)] 100 F (37.8 C) (12/15 0600) Pulse Rate:  [115-153] 118 (12/15 0600) Resp:  [14-33] 17 (12/15 0600) BP: (76-173)/(61-105) 105/68 (12/15 0600) SpO2:  [91 %-100 %] 98 % (12/15 0600) Arterial Line BP: (88-225)/(51-105) 114/58 (12/15 0600) FiO2 (%):  [40 %] 40 % (12/15 0413) Weight:  [80.3 kg] 80.3 kg (12/14 0913)     Height: '5\' 7"'$  (170.2 cm) Weight: 80.3 kg BMI (Calculated): 27.72   Intake/Output last 2 shifts:  12/14 0701 - 12/15 0700 In: 5815.7 [I.V.:3579.3; IV Piggyback:2236.4] Out: 3428 [Urine:1110; Emesis/NG output:450; Drains:1000; Blood:25]   Physical Exam:  Constitutional: Intubated; sedated  Respiratory: On ventilator  Cardiovascular: regular rate; tachycardic  Gastrointestinal: Soft, unable to assess tenderness Integumentary: Midline wound with Abthera vac; good seal   Labs:     Latest Ref Rng & Units 05/27/2022    4:03 AM 05/15/2022   12:40 PM 06/04/2022    2:17 AM  CBC  WBC 4.0 - 10.5 K/uL 18.3  20.4  22.4   Hemoglobin 13.0 - 17.0 g/dL 9.3  11.1  12.9   Hematocrit 39.0 - 52.0 % 29.1  33.2  38.7   Platelets 150 - 400 K/uL 198  211  309       Latest Ref Rng & Units 05/28/2022    4:03 AM 06/09/2022   12:40 PM 05/18/2022    2:17 AM  CMP  Glucose 70 - 99 mg/dL 200  301  347   BUN 8 - 23 mg/dL '25  26  21   '$ Creatinine 0.61 - 1.24 mg/dL 0.86  0.98  1.06   Sodium 135 - 145 mmol/L 142  138  138   Potassium 3.5 - 5.1 mmol/L 3.8  3.7   3.5   Chloride 98 - 111 mmol/L 111  110  104   CO2 22 - 32 mmol/L '24  24  22   '$ Calcium 8.9 - 10.3 mg/dL 7.8  8.3  8.1   Total Protein 6.5 - 8.1 g/dL 5.1  5.5    Total Bilirubin 0.3 - 1.2 mg/dL 1.0  1.3    Alkaline Phos 38 - 126 U/L 31  34    AST 15 - 41 U/L 24  37    ALT 0 - 44 U/L 13  19       Imaging studies: No new pertinent imaging studies   Assessment/Plan:  66 y.o. critically ill male 1 Day Post-Op s/p exploratory laparotomy, resection of necrotic small bowel, placement of abthera vac for SMA thrombus with ischemic/necrotic small bowel.   - Plan for return to the OR this afternoon with Dr Christian Mate for second look, possible further resections, determine degree of salvageable bowel remaining.   - All risks, benefits, and alternatives to above procedure(s) were discussed with the patient's family at bedside, all of their questions were answered to their expressed satisfaction, patient's family agrees to proceed, and informed consent was obtained.   -  Appreciate PCCM support with management - He is in discontinuity; can not feed enterically    - Continue IV Abx (Zosyn)   - Continue Abthera Vac - Monitor fever curve   All of the above findings and recommendations were discussed with the patient's family, and the medical team, and all of patient's family's questions were answered to their expressed satisfaction.  -- Edison Simon, PA-C [facility] 05/29/2022, 7:08 AM M-F: 7am - 4pm

## 2022-05-25 NOTE — Op Note (Signed)
Exploratory laparotomy, second look.  Pre-operative Diagnosis: Acute mesenteric ischemia, thrombo-embolus of SMA.  Open abdomen, for second look laparotomy.  Post-operative Diagnosis: same, with extensive small bowel infarction, eliminating potential viability.  Surgeon: [name], M.D., FACS  Assistant: Otho Ket, PA-C  Anesthesia: General  Findings: The only portion of the small bowel that was viable, was a short segment of about 50 cm, that was previously evidence of revascularization with the hemorrhagic mesentery.  The remaining small bowel beginning at the ligament of Treitz through the resection point was clearly and definitively nonviable.  As well as the distal small bowel through the ileocecal valve.  Estimated Blood Loss: 10 mL         Specimens: None          Complications: none              Procedure Details  The benefits, complications, treatment options, and various outcomes were discussed with the patient's family. The risks of bleeding, infection, recurrence of symptoms, failure to resolve symptoms, unanticipated injury, prosthetic placement, prosthetic infection, any of which could require further surgery were reviewed with the patient.  The potential for nonviable bowel, and the likelihood that this may lead to death also reviewed.  The likelihood of improving the patient's symptoms with return to their baseline status is guarded.  The patient and/or family concurred with the proposed plan, giving informed consent.  The patient was taken to Operating Room, identified and the procedure verified.    Prior to the induction of general anesthesia, antibiotic status was clarified.  VTE prophylaxis was in place.  General anesthesia was then administered and tolerated well. After the induction, the patient was positioned in the supine position and the ABThera VAC was removed, the open abdomen was was prepped with Betadine and draped in the sterile fashion.   Photos were  taken upon immediate removal of the ABThera VAC, and another photo was taken after the small bowel was eviscerated. A Time Out was held and the above information confirmed.  The above findings were identified, although the staple lines were intact.  There is clearly nonviable bowel on either side of the staple line.  We measured the viable portion, heroic measures entertained, and I elected to proceed with closure. The skin was then reapproximated with a running 2-0 nylon.  Dressings applied. Patient was then returned to his ICU room, where I was already engaged with family members, discussing his expected outcome.  Chaplain present.  I lingered for a significant period of time to be available for any further questions.    [name] M.D., [facility] 06/08/2022 3:04 PM

## 2022-05-25 NOTE — Progress Notes (Signed)
Family at bedside with family/visitors coming and going to bedside.  Family just informed me that they are ready for full comfort care measures. Levo d/d'd at this time. Propofol turned down (1/2). Family aware of need to balance pain control with ability to breathe at extubation

## 2022-05-25 NOTE — Interval H&P Note (Signed)
History and Physical Interval Note:  06/02/2022 10:42 AM  Howard Murphy  has presented today for surgery, with the diagnosis of ischemic bowel.  The various methods of treatment have been discussed with the patient and family. After consideration of risks, benefits and other options for treatment, the patient has consented to  Procedure(s): EXPLORATORY LAPAROTOMY (N/A) as a surgical intervention.  The patient's history has been reviewed, patient examined, no change in status, stable for surgery.  I have reviewed the patient's chart and labs.  Questions were answered to the patient's satisfaction.     [name]

## 2022-05-25 NOTE — Progress Notes (Signed)
Initial Nutrition Assessment  DOCUMENTATION CODES:   Not applicable  INTERVENTION:   -If unable to extubate within 48 hours, consider initiation of nutrition support (TPN) -When able to start enteral feedings, consider:  Initiate Vital 1.5 @ 20 ml/hr and increase by 10 ml every 4 hours to goal rate of 50 ml/hr.   60 ml Prosource TF BID.    Tube feeding regimen provides 1960 kcals, 121 grams of protein, and 917 ml of H2O.    NUTRITION DIAGNOSIS:   Inadequate oral intake related to inability to eat as evidenced by NPO status.  GOAL:   Patient will meet greater than or equal to 90% of their needs  MONITOR:   Vent status  REASON FOR ASSESSMENT:   Ventilator    ASSESSMENT:   Pt with medical history significant of hypertension, hyperlipidemia, diabetes mellitus, kidney stone, migraine, chronic pain syndrome, who presents with abdominal pain.  Pt admitted with superior mesenteric artery thrombosis.   12/13- s/p aortogram, angiogram, and thrombectomy of SMA with stent and angioplasty 12/14- s/p ex lap, resection of portion of jejunum, and wound vac placement 12/15- s/p ex lap   Patient is currently intubated on ventilator support. OGT connected to low, intermittent suction.  MV: 8.9 L/min Temp (24hrs), Avg:100.3 F (37.9 C), Min:99.1 F (37.3 C), Max:102.5 F (39.2 C)  Reviewed I/O's: +1.7 L x 24 hours and +5.1 L since admission  UOP: 1.1 L x 24 hours  OGT output: 450 ml x 24 hours  MAP: 88  Per general surgery and vascular notes, concern for bowel ischemia and viable bowel. Pathology reports ischemic changes at resection margin of previously resected segment of jejunum. Hopeful for sufficient small bowel to sustain life aprt from TPN.   Reviewed wt hx; wt has been stable over the past year.   Palliative care consult pending for goals of care.   Medications reviewed and include colace, miralax, 0.9% sodium chloride infusion @ 125 ml/hr, and levophed.   Lab  Results  Component Value Date   HGBA1C 8.5 (H) 05/12/2022   PTA DM medications are 25 mg jardiance daily and 100 mg [place] daily.   Labs reviewed: CBGS: 181-190 (inpatient orders for glycemic control are 0-15 units insulin aspart every 4 hours and 10 units insulin glargine-yfgn daily).    NUTRITION - FOCUSED PHYSICAL EXAM:  Flowsheet Row Most Recent Value  Orbital Region No depletion  Upper Arm Region No depletion  Thoracic and Lumbar Region No depletion  Buccal Region No depletion  Temple Region No depletion  Clavicle Bone Region No depletion  Clavicle and Acromion Bone Region No depletion  Scapular Bone Region No depletion  Dorsal Hand No depletion  Patellar Region No depletion  Anterior Thigh Region No depletion  Posterior Calf Region No depletion  Edema (RD Assessment) None  Hair Reviewed  Eyes Reviewed  Mouth Reviewed  Skin Reviewed  Nails Reviewed       Diet Order:   Diet Order             Diet NPO time specified  Diet effective now                   EDUCATION NEEDS:   Not appropriate for education at this time  Skin:  Skin Assessment: Skin Integrity Issues: Skin Integrity Issues:: Wound VAC Wound Vac: abdomen  Last BM:  Unknown  Height:   Ht Readings from Last 1 Encounters:  06/06/2022 '5\' 7"'$  (1.702 m)    Weight:  Wt Readings from Last 1 Encounters:  05/20/2022 80.3 kg    Ideal Body Weight:  67.3 kg  BMI:  Body mass index is 27.73 kg/m.  Estimated Nutritional Needs:   Kcal:  2095  Protein:  120-135 grams  Fluid:  > 2 L    Loistine Chance, RD, LDN, [place] Registered Dietitian II Certified Diabetes Care and Education Specialist Please refer to [facility] for RD and/or RD on-call/weekend/after hours pager

## 2022-05-25 NOTE — Progress Notes (Signed)
Pt requested no vent check at this time - They are considering CMO

## 2022-05-25 NOTE — Progress Notes (Signed)
Patient was placed on comfort measures today on day shift. Family is at bedside. Patient switched from a propofol to a versed drip for comfort. He is also on a fentanyl drip for pain management. Family expressed readiness for extubation. Respiratory therapy notified of this. He appears comfortable at this time, and explained to family to reach out to this RN if he begins to look uncomfortable or is in pain.  Cameron Ali, RN

## 2022-05-25 NOTE — Anesthesia Postprocedure Evaluation (Signed)
Anesthesia Post Note  Patient: Howard Murphy  Procedure(s) Performed: EXPLORATORY LAPAROTOMY  Patient location during evaluation: SICU Anesthesia Type: General Level of consciousness: sedated Pain management: pain level controlled Vital Signs Assessment: post-procedure vital signs reviewed and stable Respiratory status: patient remains intubated per anesthesia plan and patient on ventilator - see flowsheet for VS Cardiovascular status: stable Postop Assessment: no apparent nausea or vomiting Anesthetic complications: no   No notable events documented.   Last Vitals:  Vitals:   05/12/2022 0700 05/18/2022 0815  BP: 109/69   Pulse: (!) 121   Resp: 17   Temp: 37.8 C   SpO2: 98% 96%    Last Pain:  Vitals:   06/07/2022 0400  TempSrc: Esophageal  PainSc:                  Estill Batten

## 2022-05-25 NOTE — Progress Notes (Signed)
Approx 1515, Crystal with Palliative Care came to room. All family went to the lobby except the daughter and her brother. When the conversation was over the daughter informed me that they would rotate family thru to say goodbyes and she would inform me when they are ready for complete comfort measures and extubations. Monitors on comfort care mode to limit interruption/irritation to family members. Will continue to monitor from nurses station.

## 2022-05-25 NOTE — H&P (View-Only) (Signed)
[facility] Day(s): 2.   Post op day(s): 1 Day Post-Op.   Interval History:  Patient seen and examined Febrile again overnight; T-max 102.5F at 1700 He is off vasopressors this  His leukocytosis is actually improving; 18.3K Hgb to 9.3 Renal function is normal; sCr - 0.86; UO - 1110 ccs Hypophosphatemia to 1.6 NGT with 450 ccs out He continues on Zosyn Plan to return to the OR today   Vital signs in last 24 hours: [min-max] current  Temp:  [99.2 F (37.3 C)-102.5 F (39.2 C)] 100 F (37.8 C) (12/15 0600) Pulse Rate:  [115-153] 118 (12/15 0600) Resp:  [14-33] 17 (12/15 0600) BP: (76-173)/(61-105) 105/68 (12/15 0600) SpO2:  [91 %-100 %] 98 % (12/15 0600) Arterial Line BP: (88-225)/(51-105) 114/58 (12/15 0600) FiO2 (%):  [40 %] 40 % (12/15 0413) Weight:  [80.3 kg] 80.3 kg (12/14 0913)     Height: '5\' 7"'$  (170.2 cm) Weight: 80.3 kg BMI (Calculated): 27.72   Intake/Output last 2 shifts:  12/14 0701 - 12/15 0700 In: 5815.7 [I.V.:3579.3; IV Piggyback:2236.4] Out: 3299 [Urine:1110; Emesis/NG output:450; Drains:1000; Blood:25]   Physical Exam:  Constitutional: Intubated; sedated  Respiratory: On ventilator  Cardiovascular: regular rate; tachycardic  Gastrointestinal: Soft, unable to assess tenderness Integumentary: Midline wound with Abthera vac; good seal   Labs:     Latest Ref Rng & Units 05/28/2022    4:03 AM 05/18/2022   12:40 PM 05/21/2022    2:17 AM  CBC  WBC 4.0 - 10.5 K/uL 18.3  20.4  22.4   Hemoglobin 13.0 - 17.0 g/dL 9.3  11.1  12.9   Hematocrit 39.0 - 52.0 % 29.1  33.2  38.7   Platelets 150 - 400 K/uL 198  211  309       Latest Ref Rng & Units 05/16/2022    4:03 AM 05/22/2022   12:40 PM 06/10/2022    2:17 AM  CMP  Glucose 70 - 99 mg/dL 200  301  347   BUN 8 - 23 mg/dL '25  26  21   '$ Creatinine 0.61 - 1.24 mg/dL 0.86  0.98  1.06   Sodium 135 - 145 mmol/L 142  138  138   Potassium 3.5 - 5.1 mmol/L 3.8  3.7   3.5   Chloride 98 - 111 mmol/L 111  110  104   CO2 22 - 32 mmol/L '24  24  22   '$ Calcium 8.9 - 10.3 mg/dL 7.8  8.3  8.1   Total Protein 6.5 - 8.1 g/dL 5.1  5.5    Total Bilirubin 0.3 - 1.2 mg/dL 1.0  1.3    Alkaline Phos 38 - 126 U/L 31  34    AST 15 - 41 U/L 24  37    ALT 0 - 44 U/L 13  19       Imaging studies: No new pertinent imaging studies   Assessment/Plan:  66 y.o. critically ill male 1 Day Post-Op s/p exploratory laparotomy, resection of necrotic small bowel, placement of abthera vac for SMA thrombus with ischemic/necrotic small bowel.   - Plan for return to the OR this afternoon with Dr Christian Mate for second look, possible further resections, determine degree of salvageable bowel remaining.   - All risks, benefits, and alternatives to above procedure(s) were discussed with the patient's family at bedside, all of their questions were answered to their expressed satisfaction, patient's family agrees to proceed, and informed consent was obtained.   -  Appreciate PCCM support with management - He is in discontinuity; can not feed enterically    - Continue IV Abx (Zosyn)   - Continue Abthera Vac - Monitor fever curve   All of the above findings and recommendations were discussed with the patient's family, and the medical team, and all of patient's family's questions were answered to their expressed satisfaction.  -- Edison Simon, PA-C [facility] 06/08/2022, 7:08 AM M-F: 7am - 4pm

## 2022-05-25 NOTE — Progress Notes (Addendum)
Pulmonary and Critical care    NAME:  Howard Murphy, MRN:  [id number], DOB:  Nov 27, 1955, LOS: 2 ADMISSION DATE:  06/07/2022,     History of Present Illness:  The patient is a 66 year old male non-smoker with history of diabetes and essential hypertension.  Came to the emergency room December 13 with generalized abdominal pain and vomiting at home.  Blood pressure in the ER was 151/67 heart rate 94 saturation 97%.  Review of records indicate patient had bowel movement prior to coming to ER.  However lactic acid was more than 9 in the emergency room.  The patient was initially admitted under the hospitalist service.  CTA done showed occlusive thrombus in the SMA extending throughout multiple branches of SMA.  The patient was seen by vascular service for acute onset mesenteric ischemia.  He was taken to the vascular lab and patient had mechanical thrombectomy of the SMA and multiple branches of SMA.  Stent placed to proximal SMA angioplasty done as well to SMA branch.  The patient continued to have fever and worsening leukocytosis and ongoing abdominal pain and subsequently he was taken to the operating room on December 14.  Was found to have dark bloody colored peritoneal fluid , the bowel appeared to have areas of focal necrosis and hemorrhagic mesentery however possibly viable.  The patient was left with open abdomen and wound VAC. December 15.  Daughter at bedside.  Patient is not requiring any vasopressors.  He is on sedation and mechanical ventilation.  Oxygenating well.  No obvious distress on the ventilator. Plan to go back to operating room for second look and possible need of resections and determining degree of salvageable bowel.     05/17/2022    8:15 AM 06/04/2022    8:00 AM 05/18/2022    7:00 AM  Vitals with BMI  Systolic 409 811 914  Diastolic 72 71 69  Pulse 782 123 121    Vent Mode: PRVC FiO2 (%):  [40 %] 40 % Set Rate:  [12 bmp] 12 bmp Vt Set:  [550 mL] 550 mL PEEP:  [5  cmH20-6 cmH20] 6 cmH20 Plateau Pressure:  [19 cmH20-20 cmH20] 19 cmH20   Examination: General: Comfortable on sedation on ventilator. HENT: No icterus or JVD.  Endotracheal tube in place Lungs: Air entry bilaterally heard no wheezing or crackles resonant to percussion anteriorly Cardiovascular: S1-S2 no murmurs Abdomen: Midline wound VAC in place abdominal wound midline. 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  Status post exploratory laparotomy Bowel ischemia, with areas of focal bowel necrosis. Lactic acidosis Sepsis ?  Peritoneal source Diabetes Acute  anemia from blood loss   Plan Going back to operating room today for second look.  Probably will require bowel resection. Continue fentanyl for pain control and propofol for sedation. Vasopressors as needed.  Currently off vasopressors. Replace potassium. Will recheck labs after OR. Probably will require TPN. Continue Zosyn Echo rule out any thrombus  Best Practice (right click and "Reselect all SmartList Selections" daily)   Diet/type: NPO DVT prophylaxis: systemic heparin GI prophylaxis: PPI Lines: Central line Foley:  Yes, and it is still needed Code Status:  full code Last date of multidisciplinary goals of care discussion [12/15]  Labs   CBC: Recent Labs  Lab 06/07/2022 0941 05/30/2022 0217 05/27/2022 1240 06/08/2022 0403  WBC 18.7*  22.4* 20.4* 18.3*  NEUTROABS 15.5*  --  17.4* 15.5*  HGB 13.6 12.9* 11.1* 9.3*  HCT 41.9 38.7* 33.2* 29.1*  MCV 89.0 87.8 88.8 90.7  PLT 350 309 211 401    Basic Metabolic Panel: Recent Labs  Lab 06/01/2022 0940 06/07/2022 0941 05/22/2022 0217 05/27/2022 1240 05/14/2022 0403  NA  --  135 138 138 142  K  --  2.7* 3.5 3.7 3.8  CL  --  100 104 110 111  CO2  --  '22 22 24 24  '$ GLUCOSE  --  374* 347*  301* 200*  BUN  --  18 21 26* 25*  CREATININE  --  1.00 1.06 0.98 0.86  CALCIUM  --  9.0 8.1* 8.3* 7.8*  MG 1.3*  --  1.7 1.6* 2.0  PHOS 2.3*  --  3.4 3.2 1.6*   GFR: Estimated Creatinine Clearance: 85.8 mL/min (by C-G formula based on SCr of 0.86 mg/dL). Recent Labs  Lab 05/13/2022 0940 05/28/2022 0941 06/09/2022 1240 05/27/2022 0217 05/27/2022 1240 06/03/2022 1459 05/27/2022 1821 06/07/2022 2108 06/01/2022 0403  PROCALCITON <0.10  --   --   --   --  6.85  --   --   --   WBC  --  18.7*  --  22.4* 20.4*  --   --   --  18.3*  LATICACIDVEN  --   --    < > 3.4* 3.5*  --  2.1* 1.7  --    < > = values in this interval not displayed.    Liver Function Tests: Recent Labs  Lab 06/09/2022 0941 06/02/2022 1240 06/06/2022 0403  AST 33 37 24  ALT '22 19 13  '$ ALKPHOS 53 34* 31*  BILITOT 0.8 1.3* 1.0  PROT 6.9 5.5* 5.1*  ALBUMIN 3.8 3.2* 3.0*   Recent Labs  Lab 05/16/2022 0941  LIPASE 43   No results for input(s): "AMMONIA" in the last 168 hours.  ABG    Component Value Date/Time   PHART 7.43 05/29/2022 1334   PCO2ART 39 06/01/2022 1334   PO2ART 106 06/06/2022 1334   HCO3 25.9 05/18/2022 1334   ACIDBASEDEF 0.5 05/19/2022 1119   O2SAT 98.3 06/09/2022 1334     Coagulation Profile: Recent Labs  Lab 06/10/2022 1450  INR 1.2    Cardiac Enzymes: No results for input(s): "CKTOTAL", "CKMB", "CKMBINDEX", "TROPONINI" in the last 168 hours.  HbA1C: Hgb A1c MFr Bld  Date/Time Value Ref Range Status  06/04/2022 02:59 PM 8.5 (H) 4.8 - 5.6 % Final    Comment:    (NOTE)         Prediabetes: 5.7 - 6.4         Diabetes: >6.4         Glycemic control for adults with diabetes: <7.0   09/25/2020 05:06 AM 6.0 (H) 4.8 - 5.6 % Final    Comment:    (NOTE)         Prediabetes: 5.7 - 6.4         Diabetes: >6.4         Glycemic control for adults with diabetes: <7.0     CBG: Recent Labs  Lab 05/29/2022 1926 06/10/2022 2326 06/04/2022 0314 06/01/2022 0724 06/08/2022 1114  GLUCAP 225* 209* 183* 190* 181*     RADIOLOGY EXAM: PORTABLE CHEST 1 VIEW   COMPARISON:  06/03/2022   FINDINGS: Endotracheal tube, right jugular central venous catheter, and nasogastric tube are seen in appropriate position. Low lung volumes are noted. No  pneumothorax visualized. Mild atelectasis seen in both lung bases. No evidence of pulmonary consolidation or pleural effusion. Heart size is within normal limits.   IMPRESSION: Low lung volumes and mild bibasilar atelectasis. No pneumothorax visualized.   Support lines and tubes in appropriate position.  Review of Systems:   Can't be done.  Past Medical History:  He,  has a past medical history of Arthritis, Back ache (05/05/2013), Diabetes mellitus without complication ([place]), GERD (gastroesophageal reflux disease), Headache, History of kidney stones, Hyperlipemia, Hypertension, Kidney stones, Kidney stones, Nephrolithiasis, and Shortness of breath dyspnea.   Surgical History:   Past Surgical History:  Procedure Laterality Date   APPENDECTOMY     BACK SURGERY  2015   x3   BACK SURGERY  08/09/2016   BACK SURGERY Bilateral 2019   CHOLECYSTECTOMY  2000   COLONOSCOPY WITH PROPOFOL N/A 04/11/2015   Procedure: COLONOSCOPY WITH PROPOFOL;  Surgeon: Manya Silvas, MD;  Location: [facility] ENDOSCOPY;  Service: Endoscopy;  Laterality: N/A;   CYSTOSCOPY WITH URETEROSCOPY AND STENT PLACEMENT Right 08/27/2017   Procedure: CYSTOSCOPY WITH URETEROSCOPY AND STENT PLACEMENT;  Surgeon: Abbie Sons, MD;  Location: [facility] ORS;  Service: Urology;  Laterality: Right;   ESOPHAGOGASTRODUODENOSCOPY (EGD) WITH PROPOFOL N/A 04/11/2015   Procedure: ESOPHAGOGASTRODUODENOSCOPY (EGD) WITH PROPOFOL;  Surgeon: Manya Silvas, MD;  Location: [facility] ENDOSCOPY;  Service: Endoscopy;  Laterality: N/A;   INCISION AND DRAINAGE N/A 10/22/2020   Procedure: INCISION AND DRAINAGE of POSTERIOR LUMBAR;  Surgeon: Meade Maw, MD;  Location: [facility] ORS;  Service: Neurosurgery;  Laterality: N/A;    LAPAROSCOPIC APPENDECTOMY N/A 12/18/2017   Procedure: APPENDECTOMY LAPAROSCOPIC;  Surgeon: Florene Glen, MD;  Location: [facility] ORS;  Service: General;  Laterality: N/A;   POSTERIOR LUMBAR FUSION 4 LEVEL N/A 09/14/2020   Procedure: OPEN T10-PELVIS POSTERIOR SPINAL FUSION (HARDWARE ALREADY PRESENT AT L2-S2); L1-2 POSTERIOR SPINAL DECOMPRESSION;  Surgeon: Meade Maw, MD;  Location: [facility] ORS;  Service: Neurosurgery;  Laterality: N/A;   SHOULDER SURGERY Right 2016   X2   SHOULDER SURGERY Left 06/2015   VISCERAL ANGIOGRAPHY N/A 06/02/2022   Procedure: VISCERAL ANGIOGRAPHY;  Surgeon: Algernon Huxley, MD;  Location: [place] CV LAB;  Service: Cardiovascular;  Laterality: N/A;     Social History:   reports that he has never smoked. He quit smokeless tobacco use about 9 years ago.  His smokeless tobacco use included chew. He reports that he does not drink alcohol and does not use drugs.   Family History:  His family history includes Cancer in his maternal grandmother, mother, sister, and sister; Heart disease in his father; Stroke in his father. There is no history of Bladder Cancer, Prostate cancer, or Kidney cancer.   Allergies Allergies  Allergen Reactions   Adhesive [Tape] Other (See Comments)    Honeycomb dressing, blisters     Home Medications  Prior to Admission medications   Medication Sig Start Date End Date Taking? Authorizing Provider  acetaminophen (TYLENOL) 500 MG tablet Take 2 tablets (1,000 mg total) by mouth every 6 (six) hours. 09/17/20  Yes Meade Maw, MD  celecoxib (CELEBREX) 200 MG capsule Take 200 mg by mouth 2 (two) times daily. 05/22/22  Yes [provider]  chlorthalidone (HYGROTON) 50 MG tablet Take 50 mg by mouth daily. 04/11/22  Yes [provider]  gabapentin (NEURONTIN) 800 MG tablet Take 800 mg by mouth at bedtime. 05/17/22  Yes [provider]  JANUVIA 100 MG tablet Take 100 mg by mouth daily. 05/26/20  Yes  [provider]  JARDIANCE 25 MG TABS tablet Take 25 mg by mouth daily. 02/06/20  Yes [provider]  lisinopril (ZESTRIL) 20 MG tablet Take 20 mg by mouth daily. 04/10/22  Yes [provider]  naloxone Karma Greaser) nasal spray 4 mg/0.1 mL SMARTSIG:Both Nares 04/24/22  Yes [provider]  naproxen (NAPROSYN) 500 MG tablet SMARTSIG:1 Tablet(s) By Mouth Every 12 Hours 04/18/22  Yes [provider]  ondansetron (ZOFRAN-ODT) 8 MG disintegrating tablet Take 8 mg by mouth every 8 (eight) hours as needed. 05/11/22  Yes [provider]  oxyCODONE (ROXICODONE) 15 MG immediate release tablet Take 15 mg by mouth every 4 (four) hours as needed. 05/22/22  Yes [provider]  senna-docusate (SENOKOT-S) 8.6-50 MG tablet Take 1 tablet by mouth 2 (two) times daily. 09/27/20  Yes Domenic Polite, MD  tamsulosin (FLOMAX) 0.4 MG CAPS capsule Take 1 capsule (0.4 mg total) by mouth daily. 11/04/18  Yes Billey Co, MD  testosterone cypionate (DEPOTESTOSTERONE CYPIONATE) 200 MG/ML injection Inject 200 mg into the muscle every 7 (seven) days. 10/07/18  Yes [provider]  zolpidem (AMBIEN) 10 MG tablet Take 10 mg by mouth at bedtime.   Yes [provider]  atorvastatin (LIPITOR) 40 MG tablet Take 1 tablet (40 mg total) by mouth at bedtime. Patient not taking: Reported on 05/30/2022 03/08/17   Arnetha Courser, MD  cyclobenzaprine (FLEXERIL) 5 MG tablet Take by mouth. Patient not taking: Reported on 06/10/2022 09/27/20   [provider]  etodolac (LODINE) 400 MG tablet Take 1 tablet (400 mg total) by mouth 2 (two) times daily. Patient not taking: Reported on 06/01/2022 09/02/21   Johnn Hai, PA-C  HYDROcodone-acetaminophen (NORCO/VICODIN) 5-325 MG tablet Take 1 tablet by mouth every 6 (six) hours as needed for moderate pain. Patient not taking: Reported on 05/31/2022 09/02/21 09/02/22  Johnn Hai, PA-C  meclizine (ANTIVERT) 25 MG tablet  Take 25 mg by mouth every 6 (six) hours as needed for dizziness. Patient not taking: Reported on 05/11/2022    [provider]  methocarbamol (ROBAXIN) 500 MG tablet 1-2 tablets every 6 hours prn muscles spasms Patient not taking: Reported on 05/27/2022 09/02/21   Johnn Hai, PA-C  omeprazole (PRILOSEC OTC) 20 MG tablet Take 1 tablet (20 mg total) by mouth daily for 20 days. 09/27/20 10/17/20  Domenic Polite, MD  oxyCODONE-acetaminophen (PERCOCET) 10-325 MG tablet Take 1 tablet by mouth every 8 (eight) hours as needed for pain. Must last 30 days. 08/01/21 08/31/21  Milinda Pointer, MD     Critical care time:  I have personally spent _____60_____ minutes of critical care time, exclusive of time spent on any  procedures, in evaluation and management of this critically ill patient's condition.    Karina Lenderman MD ([facility]) FOR [place]

## 2022-05-25 NOTE — Progress Notes (Signed)
Dr. Christian Mate in room discussing findings and outcome with daughter. Daughter visibly upset and crying out. Celesta Gentile came to room and ran out crying and visibly upset. Chaplain in room to help comfort family. Lots of family continued to stream in and out of the room. Dr. Christian Mate and Thedore Mins remained present outside of the room to provide support and answer questions.

## 2022-05-25 NOTE — Progress Notes (Signed)
Reviewed exploratory laparotomy note from today.  Only about 50 cm of the small bowel was viable.  Small bowel beginning at the ligament of Treitz to the resection point was not viable.  Spoke to surgical service, current injury to the bowel is significant and extensive.  Due to extensive injury to the bowel.  At this time family has discussed options with surgical service and decided to proceed with comfort measures alone.  Spoke to the patient's daughter.  Daughter at bedside, she wants father to be pain-free, she wants to wait before removing the ventilator as other family numbers can say final goodbye. Comfort measures started.

## 2022-05-25 NOTE — Anesthesia Preprocedure Evaluation (Signed)
Anesthesia Evaluation  Patient identified by MRN, date of birth, ID band Patient unresponsive    Reviewed: Allergy & Precautions, H&P , NPO status , Patient's Chart, lab work & pertinent test results, reviewed documented beta blocker date and time   History of Anesthesia Complications Negative for: history of anesthetic complications  Airway Mallampati: Intubated  TM Distance: >3 FB Neck ROM: full    Dental  (+) Teeth Intact   Pulmonary shortness of breath, at rest and lying    + decreased breath sounds  + intubated    Cardiovascular Exercise Tolerance: Poor hypertension, On Medications + Peripheral Vascular Disease  + dysrhythmias Atrial Fibrillation  Rhythm:regular Rate:Normal     Neuro/Psych  Headaches  Neuromuscular disease  negative psych ROS   GI/Hepatic Neg liver ROS,GERD  Medicated,,  Endo/Other  negative endocrine ROSdiabetes, Poorly Controlled, Type 2    Renal/GU Renal disease  negative genitourinary   Musculoskeletal   Abdominal   Peds  Hematology negative hematology ROS (+)   Anesthesia Other Findings Past Medical History: No date: Arthritis     Comment:  Osteo vs rheumatoid ? 05/05/2013: Back ache No date: Diabetes mellitus without complication (H[facility]) No date: GERD (gastroesophageal reflux disease) No date: Headache     Comment:  MIGRAINES No date: History of kidney stones No date: Hyperlipemia No date: Hypertension No date: Kidney stones No date: Kidney stones No date: Nephrolithiasis No date: Shortness of breath dyspnea Past Surgical History: No date: APPENDECTOMY 2015: BACK SURGERY     Comment:  x3 08/09/2016: BACK SURGERY 2019: BACK SURGERY; Bilateral 2000: CHOLECYSTECTOMY 04/11/2015: COLONOSCOPY WITH PROPOFOL; N/A     Comment:  Procedure: COLONOSCOPY WITH PROPOFOL;  Surgeon: Manya Silvas, MD;  Location: [facility] ENDOSCOPY;  Service:               Endoscopy;  Laterality:  N/A; 08/27/2017: CYSTOSCOPY WITH URETEROSCOPY AND STENT PLACEMENT; Right     Comment:  Procedure: CYSTOSCOPY WITH URETEROSCOPY AND STENT               PLACEMENT;  Surgeon: Abbie Sons, MD;  Location:               [facility] ORS;  Service: Urology;  Laterality: Right; 04/11/2015: ESOPHAGOGASTRODUODENOSCOPY (EGD) WITH PROPOFOL; N/A     Comment:  Procedure: ESOPHAGOGASTRODUODENOSCOPY (EGD) WITH               PROPOFOL;  Surgeon: Manya Silvas, MD;  Location: [facility]              ENDOSCOPY;  Service: Endoscopy;  Laterality: N/A; 10/22/2020: INCISION AND DRAINAGE; N/A     Comment:  Procedure: INCISION AND DRAINAGE of POSTERIOR LUMBAR;                Surgeon: Meade Maw, MD;  Location: [facility] ORS;                Service: Neurosurgery;  Laterality: N/A; 12/18/2017: LAPAROSCOPIC APPENDECTOMY; N/A     Comment:  Procedure: APPENDECTOMY LAPAROSCOPIC;  Surgeon: Florene Glen, MD;  Location: [facility] ORS;  Service: General;                Laterality: N/A; 09/14/2020: POSTERIOR LUMBAR FUSION 4 LEVEL; N/A     Comment:  Procedure: OPEN T10-PELVIS POSTERIOR SPINAL FUSION               (  HARDWARE ALREADY PRESENT AT L2-S2); L1-2 POSTERIOR               SPINAL DECOMPRESSION;  Surgeon: Meade Maw, MD;                Location: [facility] ORS;  Service: Neurosurgery;  Laterality:               N/A; 2016: SHOULDER SURGERY; Right     Comment:  X2 06/2015: SHOULDER SURGERY; Left 05/15/2022: VISCERAL ANGIOGRAPHY; N/A     Comment:  Procedure: VISCERAL ANGIOGRAPHY;  Surgeon: Algernon Huxley,              MD;  Location: [place] CV LAB;  Service:               Cardiovascular;  Laterality: N/A; BMI    Body Mass Index: 27.73 kg/m     Reproductive/Obstetrics negative OB ROS                             Anesthesia Physical Anesthesia Plan  ASA: 4 and emergent  Anesthesia Plan: General ETT   Post-op Pain Management:    Induction: Intravenous  PONV Risk Score and  Plan: 3 and Ondansetron  Airway Management Planned: Oral ETT  Additional Equipment:   Intra-op Plan:   Post-operative Plan: Post-operative intubation/ventilation  Informed Consent: I have reviewed the patients History and Physical, chart, labs and discussed the procedure including the risks, benefits and alternatives for the proposed anesthesia with the patient or authorized representative who has indicated his/her understanding and acceptance.     Dental Advisory Given and Consent reviewed with POA  Plan Discussed with: CRNA  Anesthesia Plan Comments: (Patient's wife consented for risks of anesthesia including but not limited to:  - adverse reactions to medications - damage to eyes, teeth, lips or other oral mucosa - nerve damage due to positioning  - sore throat or hoarseness - Damage to heart, brain, nerves, lungs, other parts of body or loss of life  Patient's wife voiced understanding.)       Anesthesia Quick Evaluation

## 2022-05-25 NOTE — Progress Notes (Signed)
   05/13/2022 1600  Clinical Encounter Type  Visited With Patient and family together  Visit Type Initial  Referral From Physician  Consult/Referral To Chaplain   Chaplain responded to page. Chaplain provided support to physician communicating poor outcome of surgery to family. Chaplain provided compassionate presence and reflective listening as family were visibly in shock and sadness over undesirable outcome. Chaplain supported large family in spending time with patient. Chaplain services are available for follow up as needed.

## 2022-05-25 NOTE — Progress Notes (Signed)
I responded to a page from the nurse to provide spiritual support for the patient's family. I arrived at the patient's room where many family members were present. I shared words of encouragement, read scripture, and led in prayer.    05/30/2022 2044  Clinical Encounter Type  Visited With Patient and family together  Visit Type Initial;Spiritual support  Referral From Nurse  Consult/Referral To Chaplain  Spiritual Encounters  Spiritual Needs [facility] text;Prayer;Emotional    Chaplain Dr Redgie Grayer

## 2022-05-25 NOTE — Progress Notes (Signed)
To OR with OR staff

## 2022-05-25 NOTE — Progress Notes (Signed)
ANTICOAGULATION CONSULT NOTE  Pharmacy Consult for heparin infusion Indication:  Superior mesenteric artery thrombosis  Allergies  Allergen Reactions   Adhesive [Tape] Other (See Comments)    Honeycomb dressing, blisters    Patient Measurements: Height: '5\' 7"'$  (170.2 cm) Weight: 80.3 kg (177 lb 0.5 oz) IBW/kg (Calculated) : 66.1 Heparin Dosing Weight: 80.3 kg  Vital Signs: Temp: 99.5 F (37.5 C) (12/15 0400) Temp Source: Esophageal (12/15 0400) BP: 104/64 (12/15 0400) Pulse Rate: 121 (12/15 0400)  Labs: Recent Labs    06/10/2022 1450 06/02/2022 2137 05/14/2022 0217 05/30/2022 0922 05/12/2022 1240 06/04/2022 1459 05/19/2022 2108 06/09/2022 0403  HGB  --   --  12.9*  --  11.1*  --   --  9.3*  HCT  --   --  38.7*  --  33.2*  --   --  29.1*  PLT  --   --  309  --  211  --   --  198  APTT 22*  --   --   --   --   --   --   --   LABPROT 15.1  --   --   --   --   --   --   --   INR 1.2  --   --   --   --   --   --   --   HEPARINUNFRC  --    < > 0.60 0.49  --   --  0.45 0.43  CREATININE  --   --  1.06  --  0.98  --   --  0.86  TROPONINIHS  --   --   --   --  41* 37*  --   --    < > = values in this interval not displayed.     Estimated Creatinine Clearance: 85.8 mL/min (by C-G formula based on SCr of 0.86 mg/dL).   Medical History: Past Medical History:  Diagnosis Date   Arthritis    Osteo vs rheumatoid ?   Back ache 05/05/2013   Diabetes mellitus without complication (HCC)    GERD (gastroesophageal reflux disease)    Headache    MIGRAINES   History of kidney stones    Hyperlipemia    Hypertension    Kidney stones    Kidney stones    Nephrolithiasis    Shortness of breath dyspnea     Medications:  No home anticoagulation per pharmacist review  Assessment: 66yo male presented to ED with severe abdominal pain.  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 No chronic anticoagulation discovered PTA  Baseline Labs: aPTT 22s, INR 1.2  Goal of Therapy:   Heparin level 0.3-0.7 units/ml Monitor platelets by anticoagulation protocol: Yes    Plan: heparin level remains therapeutic - continue  heparin infusion at 1500 units/hr  - Will recheck heparin level once daily while therapeutic: next level 12/16 am   - Continue to monitor H&H and platelets  Renda Rolls, PharmD, [facility] 06/03/2022 4:53 AM

## 2022-05-25 NOTE — Inpatient Diabetes Management (Signed)
Inpatient Diabetes Program Recommendations  AACE/ADA: New Consensus Statement on Inpatient Glycemic Control (2015)  Target Ranges:  Prepandial:   less than 140 mg/dL      Peak postprandial:   less than 180 mg/dL (1-2 hours)      Critically ill patients:  140 - 180 mg/dL   Lab Results  Component Value Date   GLUCAP 190 (H) 06/10/2022   HGBA1C 8.5 (H) 05/21/2022    Review of Glycemic Control  Latest Reference Range & Units 05/15/2022 19:26 05/22/2022 23:26 05/31/2022 03:14 05/14/2022 07:24  Glucose-Capillary 70 - 99 mg/dL 225 (H) 209 (H) 183 (H) 190 (H)  (H): Data is abnormally high  Diabetes history: DM2 Outpatient Diabetes medications:  Jardiance 25 mg QD Januvia 100 mg QD Current orders for Inpatient glycemic control:  Semglee 10 units QD Novolog 0-15 units Q4H  Inpatient Diabetes Program Recommendations:    Received consult for DM2.  Semglee was held for NPO status.  Please administer Semglee as it should be administered even if NPO.  It should only be held per MD order.    Will continue to follow while inpatient.  Thank you, Reche Dixon, MSN, [place] Diabetes Coordinator Inpatient Diabetes Program [phone] (team pager from 8a-5p)

## 2022-05-25 NOTE — Progress Notes (Signed)
Extubated.

## 2022-05-25 NOTE — Transfer of Care (Signed)
Immediate Anesthesia Transfer of Care Note  Patient: Howard Murphy  Procedure(s) Performed: EXPLORATORY LAPAROTOMY  Patient Location: ICU  Anesthesia Type:General  Level of Consciousness: Patient remains intubated per anesthesia plan  Airway & Oxygen Therapy: Patient remains intubated per anesthesia plan  Post-op Assessment: Report given to RN and Post -op Vital signs reviewed and stable  Post vital signs: Reviewed and stable  Last Vitals:  Vitals Value Taken Time  BP    Temp    Pulse    Resp    SpO2      Last Pain:  Vitals:   05/30/2022 0400  TempSrc: Esophageal  PainSc:       Patients Stated Pain Goal: 0 (16/10/96 0454)  Complications: No notable events documented.

## 2022-05-26 DIAGNOSIS — K55069 Acute infarction of intestine, part and extent unspecified: Secondary | ICD-10-CM | POA: Diagnosis not present

## 2022-05-26 MED ORDER — FENTANYL 2500MCG IN NS 250ML (10MCG/ML) PREMIX INFUSION
0.0000 ug/h | INTRAVENOUS | Status: DC
  Administered 2022-05-26: 250 ug/h via INTRAVENOUS
  Filled 2022-05-26: qty 250

## 2022-05-26 MED ORDER — MORPHINE 100MG IN NS 100ML (1MG/ML) PREMIX INFUSION: 5.0000 mg/h | INTRAVENOUS | Status: DC

## 2022-05-28 ENCOUNTER — Encounter: Payer: Self-pay | Admitting: Surgery

## 2022-05-29 LAB — CULTURE, BLOOD (ROUTINE X 2)
Culture: NO GROWTH
Culture: NO GROWTH
Special Requests: ADEQUATE

## 2022-05-30 NOTE — Anesthesia Postprocedure Evaluation (Signed)
Anesthesia Post Note  Patient: Howard Murphy  Procedure(s) Performed: EXPLORATORY LAPAROTOMY  Anesthesia Type: General Anesthetic complications: no   No notable events documented.   Last Vitals:  Vitals:   2022/05/27 1257 05/27/2022 1300  BP:    Pulse: (!) 41   Resp: (!) 0 (!) 0  Temp:    SpO2: (!) 87%     Last Pain:  Vitals:   05/30/2022 0400  TempSrc: Esophageal  PainSc:                  Molli Barrows

## 2022-06-11 NOTE — Progress Notes (Signed)
Family wants patient to remain comfortable. Request he remain on fentanyl and not switch to morphine. Dr. Darlyn Chamber and orders changed. Daughter asking about timing and what to expect after. Discussed what to expect and  different funeral homes. Her and her brother stated they want to use [employer] and would like their father picked up in the room. Patient medicated to assure he remains comfortable. No other needs

## 2022-06-11 NOTE — Progress Notes (Signed)
Checked in with family. No needs. Patient continues to look comfortable and in no distress.

## 2022-06-11 NOTE — Progress Notes (Signed)
Family at bedside. Patient appears comfortable. Continue to monitor HR from nurses station. No needs at this time.

## 2022-06-11 NOTE — Progress Notes (Signed)
Family requesting "what to expect during the dying process" handout education. Family provided with the "Gone From My Sight: The Dying Experience" booklet.  Cameron Ali, RN

## 2022-06-11 NOTE — Death Summary Note (Signed)
DEATH SUMMARY   Patient Details  Name: Howard Murphy MRN: [id number] DOB: July 07, 1955 KWI:OXBDZH, [place]  Admission/Discharge Information   Admit Date:  June 03, 2022  Date of Death:    Time of Death:    Length of Stay: 3   Principle Cause of death: septic shock , superior mesenteric artery thrombosis dead  bowel.   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  Came to the emergency room 04-Jun-2023 with generalized abdominal pain and vomiting at home.  Blood pressure in the ER was 151/67 heart rate 94 saturation 97%.  Review of records indicate patient had bowel movement prior to coming to ER.  However lactic acid was more than 9 in the emergency room.  The patient was initially admitted under the hospitalist service.  CTA done showed occlusive thrombus in the SMA extending throughout multiple branches of SMA.  The patient was seen by vascular service for acute onset mesenteric ischemia.  He was taken to the vascular lab and patient had mechanical thrombectomy of the SMA and multiple branches of SMA.  Stent placed to proximal SMA angioplasty done as well to SMA branch.  The patient continued to have fever and worsening leukocytosis and ongoing abdominal pain and subsequently he was taken to the operating room on December 14.  Was found to have dark bloody colored peritoneal fluid , the bowel appeared to have areas of focal necrosis and  hemorrhagic mesentery however possibly viable.  The patient was left with open abdomen and wound VAC. On 06-06-23 patient went back to the operating room and was found to have a very significant portion of dead small intestines.  Only very small portion of the small intestine was viable.  The family chose comfort measures.  The patient was started on comfort measures yesterday on 2023-06-06 and patient passed on 06-07-2023. Assessment and Plan: No notes have been filed under this hospital service. Service: Hospitalist         The results of significant diagnostics from this hospitalization (including imaging, microbiology, ancillary and laboratory) are listed below for reference.   Significant Diagnostic Studies: DG Chest Port 1 View  Result Date: 05/22/2022 CLINICAL DATA:  Mesenteric ischemia.  Endotracheally intubated. EXAM: PORTABLE CHEST 1 VIEW COMPARISON:  Jun 03, 2022 FINDINGS: Endotracheal tube, right jugular central venous catheter, and nasogastric tube are seen in appropriate position. Low lung volumes are noted. No pneumothorax visualized. Mild atelectasis seen in both lung bases. No evidence of pulmonary consolidation or pleural effusion. Heart size is within normal limits. IMPRESSION: Low lung volumes and mild bibasilar atelectasis. No pneumothorax visualized. Support lines and tubes in appropriate position. Electronically Signed   By: Marlaine Hind M.D.   On: 06/09/2022 12:37   [place] EKG SITE RITE  Result Date: 05/13/2022 If Site Rite image not attached, placement could not be confirmed due to current cardiac rhythm.  DG Abd Portable 1V  Result Date: 05/15/2022 CLINICAL DATA:  Abdominal pain and emesis EXAM: PORTABLE ABDOMEN -  1 VIEW COMPARISON:  CTA from earlier in the same day. FINDINGS: Scattered large and small bowel gas is noted. Extensive postsurgical changes are noted in the thoracolumbar spine stable from prior exams. Contrast is noted within the bladder related to  the recent CT. No free air is noted. IMPRESSION: No acute abnormality noted. Electronically Signed   By: Inez Catalina M.D.   On: 06/07/2022 02:03   PERIPHERAL VASCULAR CATHETERIZATION  Result Date: 05/19/2022 See surgical note for result.  CT Angio Abd/Pel w/ and/or w/o  Result Date: 05/16/2022 CLINICAL DATA:  Mesenteric ischemia, abdominal pain, elevated lactate EXAM: CTA ABDOMEN AND PELVIS WITHOUT AND WITH CONTRAST TECHNIQUE: Multidetector CT imaging of the abdomen and pelvis was performed using the standard protocol during bolus administration of intravenous contrast. Multiplanar reconstructed images and MIPs were obtained and reviewed to evaluate the vascular anatomy. RADIATION DOSE REDUCTION: This exam was performed according to the departmental dose-optimization program which includes automated exposure control, adjustment of the mA and/or kV according to patient size and/or use of iterative reconstruction technique. CONTRAST:  68m OMNIPAQUE IOHEXOL 350 MG/ML SOLN COMPARISON:  CT 12/22/2019 FINDINGS: VASCULAR Aorta: Minimal calcified aortic atheromatous plaque. No aneurysm, dissection, or stenosis. Celiac: Patent without evidence of aneurysm, dissection, vasculitis or significant stenosis. SMA: There is occlusive thrombus in the SMA extending throughout most of its length from just beyond the origin, with limited segmental areas of collateral reconstitution. The thrombus extends into multiple SMA branches. No definite lesion is identified in the native vessel, suggesting possible embolic origin. Renals: Single left renal artery, widely patent. Duplicated right renal arteries, superior dominant, both patent. IMA: Patent without evidence of aneurysm, dissection, vasculitis or significant stenosis. Inflow: Minimal nonocclusive atheromatous plaque. No aneurysm or stenosis. Proximal Outflow: Bilateral common femoral and visualized portions of the superficial and profunda femoral arteries are patent  without evidence of aneurysm, dissection, vasculitis or significant stenosis. Veins: Patent hepatic veins, portal vein, SM V. no portal venous gas. Some of the mesenteric branches from small-bowel are incompletely opacified probably secondary to low flow due to occluded SMA inflow. Iliac venous system, IVC, and renal veins unremarkable. Review of the MIP images confirms the above findings. NON-VASCULAR Lower chest: Coronary calcifications. No pleural or pericardial effusion. Visualized lung bases clear. Hepatobiliary: No focal liver abnormality is seen. Status post cholecystectomy. No biliary dilatation. Pancreas: Unremarkable. No pancreatic ductal dilatation or surrounding inflammatory changes. Spleen: Normal in size without focal abnormality. Adrenals/Urinary Tract: Adrenal glands are unremarkable. Kidneys are normal, without renal calculi, focal lesion, or hydronephrosis. Bladder is unremarkable. Stomach/Bowel: Stomach is partially distended by ingested material, unremarkable. Small bowel is nondilated, with 2 loops demonstrating circumferential persistent wall thickening in the anterior mid abdomen (Im109 and 116,Se5 and 12) . No dilatation or pneumatosis. The colon is incompletely distended by gas and fecal material, unremarkable. Lymphatic: No abdominal or pelvic adenopathy. Reproductive: Prostate enlargement with central coarse calcifications. Other: No ascites.  No free air. Musculoskeletal: Small paraumbilical hernia containing only mesenteric fat. Focal skin lesion left anterior pelvic body wall with regional skin thickening and subcutaneous inflammatory/edematous change. Thoracolumbar fusion hardware extending T10 to across the sacroiliac joints bilaterally, appearing intact without surrounding lucency. No acute finding. IMPRESSION: 1. 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 Urgent vascular  consultation recommended. 2. Two loops of persistent circumferential small bowel wall thickening in the anterior mid abdomen, without evidence of obstruction or pneumatosis. Critical Value/emergent results were called by  telephone at the time of interpretation on 05/18/2022 at 2:24 pm to provider [facility] , who verbally acknowledged these results. Electronically Signed   By: Lucrezia Europe M.D.   On: 05/14/2022 14:26   CT ABDOMEN PELVIS W CONTRAST  Result Date: 05/19/2022 CLINICAL DATA:  Acute nonlocalized abdominal pain EXAM: CT ABDOMEN AND PELVIS WITH CONTRAST TECHNIQUE: Multidetector CT imaging of the abdomen and pelvis was performed using the standard protocol following bolus administration of intravenous contrast. RADIATION DOSE REDUCTION: This exam was performed according to the departmental dose-optimization program which includes automated exposure control, adjustment of the mA and/or kV according to patient size and/or use of iterative reconstruction technique. CONTRAST:  176m OMNIPAQUE IOHEXOL 300 MG/ML  SOLN COMPARISON:  CT December 22, 2019. FINDINGS: Lower chest: No acute abnormality. Hepatobiliary: No suspicious hepatic lesion. Gallbladder surgically absent. No biliary ductal dilation. Pancreas: No pancreatic ductal dilation or evidence of acute inflammation. Spleen: No splenomegaly or focal splenic lesion. Adrenals/Urinary Tract: Bilateral adrenal glands appear normal. No hydronephrosis. Symmetric excretion of contrast into the collecting systems bilaterally. Probable radiopaque contrast material layering in the urinary bladder. Stomach/Bowel: Stomach is unremarkable for degree of distension. Duodenal diverticulum. No pathologic dilation of small or large bowel. The appendix is surgically absent. Scattered left-sided colonic diverticulosis without evidence of acute diverticulitis. Vascular/Lymphatic: Aortic atherosclerosis. No pathologically enlarged abdominal or pelvic lymph nodes. Reproductive:  Dystrophic calcifications in the prostate gland. Other: Small fat containing supraumbilical ventral hernia. Musculoskeletal: Posterior fusion hardware extending from T10 through the sacrum is well as through the bilateral sacroiliac joints. No acute osseous abnormality. IMPRESSION: 1. No acute abnormality in the abdomen or pelvis. 2. Scattered left-sided colonic diverticulosis without evidence of acute diverticulitis. 3. Small fat containing supraumbilical ventral hernia. 4.  Aortic Atherosclerosis (ICD10-I70.0). Electronically Signed   By: JDahlia BailiffM.D.   On: 05/29/2022 09:38   DG Chest Portable 1 View  Result Date: 05/29/2022 CLINICAL DATA:  Generalized abdominal pain. EXAM: PORTABLE CHEST 1 VIEW COMPARISON:  07/29/2020 FINDINGS: Stable elevation of the right diaphragm. No focal consolidation. No pleural effusion or pneumothorax. Heart and mediastinal contours are unremarkable. No acute osseous abnormality. IMPRESSION: No active disease. Electronically Signed   By: HKathreen DevoidM.D.   On: 05/21/2022 09:19    Microbiology: Recent Results (from the past 240 hour(s))  MRSA Next Gen by PCR, Nasal     Status: Abnormal   Collection Time: 06/04/2022 12:40 PM   Specimen: Nasal Mucosa; Nasal Swab  Result Value Ref Range Status   MRSA by PCR Next Gen DETECTED (A) NOT DETECTED Final    Comment: RESULT CALLED TO, READ BACK BY AND VERIFIED WITH: ESaundra ShellingRN [id number]/2023 HNM (NOTE) The GeneXpert MRSA Assay (FDA approved for NASAL specimens only), is one component of a comprehensive MRSA colonization surveillance program. It is not intended to diagnose MRSA infection nor to guide or monitor treatment for MRSA infections. Test performance is not FDA approved in patients less than 242years old. [facility] [address], [place] [id number]  Culture, blood (Routine X 2) w Reflex to ID Panel     Status: None (Preliminary result)   Collection Time: 05/24/22  4:24  PM   Specimen: BLOOD RIGHT HAND  Result Value Ref Range Status   Specimen Description BLOOD RIGHT HAND  Final   Special Requests   Final    BOTTLES DRAWN AEROBIC AND ANAEROBIC Blood Culture results may not be optimal due to an inadequate  volume of blood received in culture bottles   Culture   Final    NO GROWTH 2 DAYS Performed at [facility], [address] [place], [place] [address]    Report Status PENDING  Incomplete  Culture, blood (Routine X 2) w Reflex to ID Panel     Status: None (Preliminary result)   Collection Time: 06/05/2022  4:24 PM   Specimen: Left Antecubital; Blood  Result Value Ref Range Status   Specimen Description LEFT ANTECUBITAL  Final   Special Requests   Final    BOTTLES DRAWN AEROBIC AND ANAEROBIC Blood Culture adequate volume   Culture   Final    NO GROWTH 2 DAYS Performed at [facility], [address], [place], [place] [address]    Report Status PENDING  Incomplete    Time spent: 15 minutes  Signed: Jeani Hawking, MD 06/14/2022

## 2022-06-11 NOTE — Anesthesia Postprocedure Evaluation (Signed)
Anesthesia Post Note  Patient: Howard Murphy  Procedure(s) Performed: EXPLORATORY LAPAROTOMY  Patient location during evaluation: SICU Anesthesia Type: General Level of consciousness: sedated Pain management: pain level controlled Vital Signs Assessment: post-procedure vital signs reviewed and stable Respiratory status: spontaneous breathing and respiratory function stable Cardiovascular status: blood pressure returned to baseline and stable Postop Assessment: no apparent nausea or vomiting Anesthetic complications: no Comments: Patient now on hospice   No notable events documented.   Last Vitals:  Vitals:   06-11-2022 0400 06-11-22 0500  BP:    Pulse: (!) 146 (!) 148  Resp: (!) 37 (!) 37  Temp:    SpO2: (!) 87% (!) 87%    Last Pain:  Vitals:   06/08/2022 0400  TempSrc: Esophageal  PainSc:                  Precious Haws Luree Palla

## 2022-06-11 NOTE — Progress Notes (Signed)
      Daily Progress Note   Intra-op findings not compatible with continued life.  Comfort measures in progress.      Adele Barthel, MD, FACS, FSVS Covering for [facility]: [phone]  06/07/22, 7:37 AM

## 2022-06-11 NOTE — Progress Notes (Signed)
Asystole on monitor. Patient pronounced by Durene Fruits RN and myself.  Family at side waiting for other family to arrive. Denies needs at this time, aware to notify me if anything is needed.

## 2022-06-11 NOTE — Progress Notes (Signed)
Patient remains comfortable. Family at bedside. Denies any needs.

## 2022-06-11 DEATH — deceased
